# Patient Record
Sex: Female | Born: 1955 | Race: Black or African American | Hispanic: No | Marital: Married | Smoking: Never smoker
Health system: Southern US, Community
[De-identification: ages and names within clinical notes are randomized; demographics above are authoritative.]

## PROBLEM LIST (undated history)

## (undated) DIAGNOSIS — Z950 Presence of cardiac pacemaker: Secondary | ICD-10-CM

## (undated) DIAGNOSIS — IMO0001 Reserved for inherently not codable concepts without codable children: Secondary | ICD-10-CM

## (undated) DIAGNOSIS — Z8679 Personal history of other diseases of the circulatory system: Secondary | ICD-10-CM

## (undated) DIAGNOSIS — I10 Essential (primary) hypertension: Secondary | ICD-10-CM

## (undated) DIAGNOSIS — Z9889 Other specified postprocedural states: Secondary | ICD-10-CM

## (undated) DIAGNOSIS — I482 Chronic atrial fibrillation, unspecified: Secondary | ICD-10-CM

## (undated) DIAGNOSIS — E1165 Type 2 diabetes mellitus with hyperglycemia: Secondary | ICD-10-CM

## (undated) HISTORY — DX: Personal history of other diseases of the circulatory system: Z86.79

## (undated) HISTORY — DX: Type 2 diabetes mellitus with hyperglycemia: E11.65

## (undated) HISTORY — DX: Presence of cardiac pacemaker: Z95.0

## (undated) HISTORY — DX: Chronic atrial fibrillation, unspecified: I48.20

## (undated) HISTORY — DX: Reserved for inherently not codable concepts without codable children: IMO0001

## (undated) HISTORY — DX: Other specified postprocedural states: Z98.890

## (undated) HISTORY — DX: Essential (primary) hypertension: I10

---

## 1999-07-18 DIAGNOSIS — Z8679 Personal history of other diseases of the circulatory system: Secondary | ICD-10-CM

## 1999-07-18 DIAGNOSIS — Z9889 Other specified postprocedural states: Secondary | ICD-10-CM

## 1999-07-18 DIAGNOSIS — Z950 Presence of cardiac pacemaker: Secondary | ICD-10-CM

## 1999-07-18 HISTORY — DX: Presence of cardiac pacemaker: Z95.0

## 1999-07-18 HISTORY — PX: MITRAL VALVE REPAIR: SHX2039

## 1999-07-18 HISTORY — DX: Other specified postprocedural states: Z98.890

## 1999-07-18 HISTORY — PX: PACEMAKER INSERTION: SHX728

## 1999-07-18 HISTORY — DX: Personal history of other diseases of the circulatory system: Z86.79

## 1999-08-01 ENCOUNTER — Encounter: Payer: Self-pay | Admitting: *Deleted

## 1999-08-01 ENCOUNTER — Encounter (INDEPENDENT_AMBULATORY_CARE_PROVIDER_SITE_OTHER): Payer: Self-pay | Admitting: *Deleted

## 1999-08-01 ENCOUNTER — Inpatient Hospital Stay (HOSPITAL_COMMUNITY): Admission: EM | Admit: 1999-08-01 | Discharge: 1999-08-22 | Payer: Self-pay | Admitting: Emergency Medicine

## 1999-08-01 ENCOUNTER — Encounter: Payer: Self-pay | Admitting: Family Medicine

## 1999-08-03 DIAGNOSIS — I482 Chronic atrial fibrillation, unspecified: Secondary | ICD-10-CM

## 1999-08-03 DIAGNOSIS — I4891 Unspecified atrial fibrillation: Secondary | ICD-10-CM | POA: Insufficient documentation

## 1999-08-03 HISTORY — PX: CARDIAC CATHETERIZATION: SHX172

## 1999-08-05 ENCOUNTER — Encounter: Payer: Self-pay | Admitting: Thoracic Surgery (Cardiothoracic Vascular Surgery)

## 1999-08-08 ENCOUNTER — Encounter: Payer: Self-pay | Admitting: Surgery

## 1999-08-09 ENCOUNTER — Encounter: Payer: Self-pay | Admitting: Thoracic Surgery (Cardiothoracic Vascular Surgery)

## 1999-08-09 DIAGNOSIS — Z9889 Other specified postprocedural states: Secondary | ICD-10-CM

## 1999-08-09 HISTORY — DX: Other specified postprocedural states: Z98.890

## 1999-08-10 ENCOUNTER — Encounter: Payer: Self-pay | Admitting: Thoracic Surgery (Cardiothoracic Vascular Surgery)

## 1999-08-11 ENCOUNTER — Encounter: Payer: Self-pay | Admitting: Thoracic Surgery (Cardiothoracic Vascular Surgery)

## 1999-08-13 ENCOUNTER — Encounter: Payer: Self-pay | Admitting: Thoracic Surgery (Cardiothoracic Vascular Surgery)

## 1999-08-18 ENCOUNTER — Encounter: Payer: Self-pay | Admitting: Cardiovascular Disease

## 1999-09-02 ENCOUNTER — Encounter
Admission: RE | Admit: 1999-09-02 | Discharge: 1999-09-02 | Payer: Self-pay | Admitting: Thoracic Surgery (Cardiothoracic Vascular Surgery)

## 1999-09-02 ENCOUNTER — Encounter: Admission: RE | Admit: 1999-09-02 | Discharge: 1999-09-02 | Payer: Self-pay | Admitting: Cardiothoracic Surgery

## 1999-09-02 ENCOUNTER — Encounter: Payer: Self-pay | Admitting: Thoracic Surgery (Cardiothoracic Vascular Surgery)

## 1999-09-02 ENCOUNTER — Encounter: Payer: Self-pay | Admitting: Cardiothoracic Surgery

## 1999-09-14 DIAGNOSIS — I080 Rheumatic disorders of both mitral and aortic valves: Secondary | ICD-10-CM | POA: Insufficient documentation

## 1999-09-26 ENCOUNTER — Encounter: Payer: Self-pay | Admitting: Thoracic Surgery (Cardiothoracic Vascular Surgery)

## 1999-09-26 ENCOUNTER — Encounter
Admission: RE | Admit: 1999-09-26 | Discharge: 1999-09-26 | Payer: Self-pay | Admitting: Thoracic Surgery (Cardiothoracic Vascular Surgery)

## 2000-03-30 ENCOUNTER — Encounter: Admission: RE | Admit: 2000-03-30 | Discharge: 2000-03-30 | Payer: Self-pay | Admitting: Family Medicine

## 2000-04-30 ENCOUNTER — Ambulatory Visit (HOSPITAL_COMMUNITY): Admission: RE | Admit: 2000-04-30 | Discharge: 2000-04-30 | Payer: Self-pay | Admitting: *Deleted

## 2000-05-01 ENCOUNTER — Encounter: Admission: RE | Admit: 2000-05-01 | Discharge: 2000-05-01 | Payer: Self-pay | Admitting: Family Medicine

## 2000-05-10 ENCOUNTER — Encounter: Admission: RE | Admit: 2000-05-10 | Discharge: 2000-05-10 | Payer: Self-pay | Admitting: *Deleted

## 2000-07-17 ENCOUNTER — Encounter (INDEPENDENT_AMBULATORY_CARE_PROVIDER_SITE_OTHER): Payer: Self-pay | Admitting: *Deleted

## 2000-08-03 ENCOUNTER — Encounter: Admission: RE | Admit: 2000-08-03 | Discharge: 2000-08-03 | Payer: Self-pay | Admitting: Family Medicine

## 2000-10-23 ENCOUNTER — Encounter: Admission: RE | Admit: 2000-10-23 | Discharge: 2000-10-23 | Payer: Self-pay | Admitting: Family Medicine

## 2001-11-28 ENCOUNTER — Inpatient Hospital Stay (HOSPITAL_COMMUNITY): Admission: EM | Admit: 2001-11-28 | Discharge: 2001-12-03 | Payer: Self-pay | Admitting: Emergency Medicine

## 2001-11-28 ENCOUNTER — Encounter: Payer: Self-pay | Admitting: Cardiovascular Disease

## 2002-03-26 ENCOUNTER — Encounter: Admission: RE | Admit: 2002-03-26 | Discharge: 2002-03-26 | Payer: Self-pay | Admitting: Family Medicine

## 2002-04-03 ENCOUNTER — Emergency Department (HOSPITAL_COMMUNITY): Admission: EM | Admit: 2002-04-03 | Discharge: 2002-04-03 | Payer: Self-pay | Admitting: *Deleted

## 2002-04-14 ENCOUNTER — Encounter: Admission: RE | Admit: 2002-04-14 | Discharge: 2002-04-14 | Payer: Self-pay | Admitting: Sports Medicine

## 2002-08-20 ENCOUNTER — Encounter: Admission: RE | Admit: 2002-08-20 | Discharge: 2002-08-20 | Payer: Self-pay | Admitting: Family Medicine

## 2002-12-17 ENCOUNTER — Emergency Department (HOSPITAL_COMMUNITY): Admission: EM | Admit: 2002-12-17 | Discharge: 2002-12-17 | Payer: Self-pay | Admitting: Emergency Medicine

## 2002-12-31 ENCOUNTER — Encounter: Admission: RE | Admit: 2002-12-31 | Discharge: 2002-12-31 | Payer: Self-pay | Admitting: Family Medicine

## 2003-01-12 ENCOUNTER — Encounter: Admission: RE | Admit: 2003-01-12 | Discharge: 2003-01-12 | Payer: Self-pay | Admitting: Sports Medicine

## 2003-04-27 ENCOUNTER — Encounter: Admission: RE | Admit: 2003-04-27 | Discharge: 2003-04-27 | Payer: Self-pay | Admitting: Family Medicine

## 2003-08-13 ENCOUNTER — Emergency Department (HOSPITAL_COMMUNITY): Admission: AD | Admit: 2003-08-13 | Discharge: 2003-08-13 | Payer: Self-pay | Admitting: Family Medicine

## 2003-10-13 ENCOUNTER — Encounter: Admission: RE | Admit: 2003-10-13 | Discharge: 2003-10-13 | Payer: Self-pay | Admitting: Family Medicine

## 2003-10-30 ENCOUNTER — Encounter: Admission: RE | Admit: 2003-10-30 | Discharge: 2003-10-30 | Payer: Self-pay | Admitting: Sports Medicine

## 2003-11-29 ENCOUNTER — Emergency Department (HOSPITAL_COMMUNITY): Admission: EM | Admit: 2003-11-29 | Discharge: 2003-11-29 | Payer: Self-pay | Admitting: Family Medicine

## 2004-02-01 ENCOUNTER — Encounter: Admission: RE | Admit: 2004-02-01 | Discharge: 2004-02-01 | Payer: Self-pay | Admitting: Family Medicine

## 2004-02-01 ENCOUNTER — Inpatient Hospital Stay (HOSPITAL_COMMUNITY): Admission: AD | Admit: 2004-02-01 | Discharge: 2004-02-04 | Payer: Self-pay | Admitting: Cardiology

## 2004-12-21 ENCOUNTER — Ambulatory Visit: Payer: Self-pay | Admitting: Family Medicine

## 2005-03-23 ENCOUNTER — Ambulatory Visit (HOSPITAL_COMMUNITY): Admission: RE | Admit: 2005-03-23 | Discharge: 2005-03-23 | Payer: Self-pay | Admitting: Obstetrics

## 2005-04-21 ENCOUNTER — Ambulatory Visit: Payer: Self-pay | Admitting: Family Medicine

## 2006-01-10 ENCOUNTER — Ambulatory Visit: Payer: Self-pay | Admitting: Family Medicine

## 2006-01-11 ENCOUNTER — Encounter: Admission: RE | Admit: 2006-01-11 | Discharge: 2006-01-11 | Payer: Self-pay | Admitting: Family Medicine

## 2006-01-24 ENCOUNTER — Emergency Department (HOSPITAL_COMMUNITY): Admission: EM | Admit: 2006-01-24 | Discharge: 2006-01-24 | Payer: Self-pay | Admitting: Emergency Medicine

## 2006-02-28 ENCOUNTER — Ambulatory Visit: Payer: Self-pay | Admitting: Sports Medicine

## 2006-09-14 ENCOUNTER — Encounter (INDEPENDENT_AMBULATORY_CARE_PROVIDER_SITE_OTHER): Payer: Self-pay | Admitting: *Deleted

## 2006-11-05 ENCOUNTER — Telehealth: Payer: Self-pay | Admitting: *Deleted

## 2006-11-08 ENCOUNTER — Ambulatory Visit: Payer: Self-pay | Admitting: Sports Medicine

## 2006-11-08 ENCOUNTER — Encounter: Payer: Self-pay | Admitting: Family Medicine

## 2006-11-08 LAB — CONVERTED CEMR LAB
MCV: 91.2 fL
WBC: 6.7 10*3/uL

## 2006-11-21 ENCOUNTER — Telehealth (INDEPENDENT_AMBULATORY_CARE_PROVIDER_SITE_OTHER): Payer: Self-pay | Admitting: *Deleted

## 2006-11-29 ENCOUNTER — Telehealth (INDEPENDENT_AMBULATORY_CARE_PROVIDER_SITE_OTHER): Payer: Self-pay | Admitting: Family Medicine

## 2006-12-05 ENCOUNTER — Encounter: Payer: Self-pay | Admitting: Family Medicine

## 2007-01-01 ENCOUNTER — Ambulatory Visit: Payer: Self-pay | Admitting: Sports Medicine

## 2007-01-30 ENCOUNTER — Telehealth: Payer: Self-pay | Admitting: *Deleted

## 2007-01-31 ENCOUNTER — Encounter: Payer: Self-pay | Admitting: Family Medicine

## 2007-01-31 ENCOUNTER — Ambulatory Visit (HOSPITAL_COMMUNITY): Admission: RE | Admit: 2007-01-31 | Discharge: 2007-01-31 | Payer: Self-pay | Admitting: Family Medicine

## 2007-01-31 ENCOUNTER — Ambulatory Visit: Payer: Self-pay | Admitting: Family Medicine

## 2007-01-31 LAB — CONVERTED CEMR LAB
BUN: 9 mg/dL (ref 6–23)
CO2: 28 meq/L (ref 19–32)
Calcium: 8.5 mg/dL (ref 8.4–10.5)
Chloride: 103 meq/L (ref 96–112)
Creatinine, Ser: 0.74 mg/dL (ref 0.40–1.20)
Glucose, Bld: 113 mg/dL — ABNORMAL HIGH (ref 70–99)
Hemoglobin: 13.6 g/dL (ref 12.0–15.0)
MCHC: 32.5 g/dL (ref 30.0–36.0)
Platelets: 162 10*3/uL (ref 150–400)
Potassium: 3.8 meq/L (ref 3.5–5.3)
RBC: 4.51 M/uL (ref 3.87–5.11)
TSH: 1.411 microintl units/mL (ref 0.350–5.50)
WBC: 6.1 10*3/uL (ref 4.0–10.5)

## 2007-02-01 ENCOUNTER — Telehealth: Payer: Self-pay | Admitting: Family Medicine

## 2007-02-01 ENCOUNTER — Encounter: Payer: Self-pay | Admitting: Family Medicine

## 2007-02-06 ENCOUNTER — Encounter: Payer: Self-pay | Admitting: Family Medicine

## 2007-04-10 ENCOUNTER — Encounter: Payer: Self-pay | Admitting: Family Medicine

## 2007-05-24 ENCOUNTER — Ambulatory Visit: Payer: Self-pay | Admitting: Family Medicine

## 2007-05-24 LAB — CONVERTED CEMR LAB: INR: 2.7

## 2007-05-31 ENCOUNTER — Telehealth: Payer: Self-pay | Admitting: *Deleted

## 2007-05-31 ENCOUNTER — Ambulatory Visit: Payer: Self-pay | Admitting: Family Medicine

## 2007-06-06 ENCOUNTER — Encounter: Payer: Self-pay | Admitting: Family Medicine

## 2007-06-07 ENCOUNTER — Encounter: Payer: Self-pay | Admitting: Family Medicine

## 2007-06-08 ENCOUNTER — Encounter: Payer: Self-pay | Admitting: Family Medicine

## 2007-06-11 ENCOUNTER — Encounter: Payer: Self-pay | Admitting: Family Medicine

## 2007-06-17 ENCOUNTER — Telehealth: Payer: Self-pay | Admitting: *Deleted

## 2007-06-21 ENCOUNTER — Encounter (INDEPENDENT_AMBULATORY_CARE_PROVIDER_SITE_OTHER): Payer: Self-pay | Admitting: *Deleted

## 2007-07-09 ENCOUNTER — Encounter: Payer: Self-pay | Admitting: Family Medicine

## 2007-07-23 ENCOUNTER — Ambulatory Visit: Payer: Self-pay | Admitting: Family Medicine

## 2007-07-23 LAB — CONVERTED CEMR LAB: INR: 1.4

## 2007-07-30 ENCOUNTER — Ambulatory Visit: Payer: Self-pay | Admitting: Family Medicine

## 2007-07-30 LAB — CONVERTED CEMR LAB: INR: 2.5

## 2007-08-08 ENCOUNTER — Ambulatory Visit: Payer: Self-pay | Admitting: Family Medicine

## 2007-08-22 ENCOUNTER — Ambulatory Visit: Payer: Self-pay | Admitting: Family Medicine

## 2007-09-12 ENCOUNTER — Encounter: Payer: Self-pay | Admitting: Family Medicine

## 2007-09-13 ENCOUNTER — Ambulatory Visit: Payer: Self-pay | Admitting: Family Medicine

## 2007-10-11 ENCOUNTER — Ambulatory Visit: Payer: Self-pay | Admitting: Family Medicine

## 2007-10-31 ENCOUNTER — Encounter: Payer: Self-pay | Admitting: Family Medicine

## 2007-11-07 LAB — CONVERTED CEMR LAB
Hgb A1c MFr Bld: 7.7 %
Pro B Natriuretic peptide (BNP): 331 pg/mL

## 2007-11-08 ENCOUNTER — Ambulatory Visit: Payer: Self-pay | Admitting: Family Medicine

## 2007-12-06 ENCOUNTER — Encounter: Payer: Self-pay | Admitting: Family Medicine

## 2007-12-10 ENCOUNTER — Ambulatory Visit: Payer: Self-pay | Admitting: Family Medicine

## 2007-12-20 ENCOUNTER — Telehealth: Payer: Self-pay | Admitting: Family Medicine

## 2008-01-07 ENCOUNTER — Ambulatory Visit: Payer: Self-pay | Admitting: Family Medicine

## 2008-01-14 ENCOUNTER — Telehealth: Payer: Self-pay | Admitting: Family Medicine

## 2008-01-14 ENCOUNTER — Encounter: Payer: Self-pay | Admitting: Family Medicine

## 2008-01-20 ENCOUNTER — Telehealth (INDEPENDENT_AMBULATORY_CARE_PROVIDER_SITE_OTHER): Payer: Self-pay | Admitting: *Deleted

## 2008-01-30 ENCOUNTER — Ambulatory Visit: Payer: Self-pay | Admitting: Family Medicine

## 2008-01-30 LAB — CONVERTED CEMR LAB: INR: 2

## 2008-02-11 ENCOUNTER — Ambulatory Visit: Payer: Self-pay | Admitting: Family Medicine

## 2008-02-11 ENCOUNTER — Encounter: Payer: Self-pay | Admitting: Family Medicine

## 2008-02-11 DIAGNOSIS — E1165 Type 2 diabetes mellitus with hyperglycemia: Secondary | ICD-10-CM

## 2008-02-11 LAB — CONVERTED CEMR LAB: Hgb A1c MFr Bld: 8.8 %

## 2008-02-13 ENCOUNTER — Encounter: Payer: Self-pay | Admitting: Family Medicine

## 2008-02-27 ENCOUNTER — Encounter: Payer: Self-pay | Admitting: Family Medicine

## 2008-02-28 ENCOUNTER — Ambulatory Visit: Payer: Self-pay | Admitting: Family Medicine

## 2008-03-13 ENCOUNTER — Ambulatory Visit: Payer: Self-pay | Admitting: Family Medicine

## 2008-03-13 LAB — CONVERTED CEMR LAB: INR: 1.7

## 2008-03-18 ENCOUNTER — Encounter: Payer: Self-pay | Admitting: Family Medicine

## 2008-03-18 LAB — CONVERTED CEMR LAB: Glucose, Bld: 261 mg/dL

## 2008-03-25 ENCOUNTER — Encounter: Admission: RE | Admit: 2008-03-25 | Discharge: 2008-03-25 | Payer: Self-pay | Admitting: Obstetrics

## 2008-03-31 ENCOUNTER — Ambulatory Visit: Payer: Self-pay | Admitting: Family Medicine

## 2008-03-31 LAB — CONVERTED CEMR LAB: INR: 2.8

## 2008-04-14 ENCOUNTER — Ambulatory Visit: Payer: Self-pay | Admitting: Family Medicine

## 2008-04-28 ENCOUNTER — Encounter: Payer: Self-pay | Admitting: Family Medicine

## 2008-05-01 ENCOUNTER — Ambulatory Visit: Payer: Self-pay | Admitting: Family Medicine

## 2008-05-02 ENCOUNTER — Encounter: Payer: Self-pay | Admitting: Family Medicine

## 2008-05-22 ENCOUNTER — Ambulatory Visit: Payer: Self-pay | Admitting: Family Medicine

## 2008-05-22 LAB — CONVERTED CEMR LAB: INR: 2.9

## 2008-06-24 ENCOUNTER — Ambulatory Visit: Payer: Self-pay | Admitting: Family Medicine

## 2008-06-24 LAB — CONVERTED CEMR LAB: INR: 2

## 2008-07-22 ENCOUNTER — Encounter: Payer: Self-pay | Admitting: Family Medicine

## 2008-07-23 ENCOUNTER — Ambulatory Visit: Payer: Self-pay | Admitting: Family Medicine

## 2008-07-23 LAB — CONVERTED CEMR LAB: INR: 2.5

## 2008-08-20 ENCOUNTER — Encounter: Payer: Self-pay | Admitting: Family Medicine

## 2008-09-11 ENCOUNTER — Ambulatory Visit: Payer: Self-pay | Admitting: Family Medicine

## 2008-10-08 ENCOUNTER — Ambulatory Visit: Payer: Self-pay | Admitting: Family Medicine

## 2008-11-10 ENCOUNTER — Ambulatory Visit: Payer: Self-pay | Admitting: Family Medicine

## 2008-11-10 ENCOUNTER — Encounter: Payer: Self-pay | Admitting: Family Medicine

## 2008-11-10 LAB — CONVERTED CEMR LAB: INR: 2

## 2008-11-26 ENCOUNTER — Encounter: Payer: Self-pay | Admitting: Family Medicine

## 2008-11-26 ENCOUNTER — Ambulatory Visit (HOSPITAL_COMMUNITY): Admission: RE | Admit: 2008-11-26 | Discharge: 2008-11-26 | Payer: Self-pay | Admitting: Cardiology

## 2008-11-26 HISTORY — PX: PACEMAKER GENERATOR CHANGE: SHX5998

## 2008-11-26 LAB — CONVERTED CEMR LAB
Cholesterol: 177 mg/dL
HDL: 50 mg/dL
LDL Cholesterol: 112 mg/dL
Total CHOL/HDL Ratio: 3.5
Triglycerides: 77 mg/dL

## 2008-11-30 ENCOUNTER — Encounter: Payer: Self-pay | Admitting: Family Medicine

## 2008-11-30 ENCOUNTER — Ambulatory Visit: Payer: Self-pay | Admitting: Family Medicine

## 2008-11-30 LAB — CONVERTED CEMR LAB: INR: 1.5

## 2008-12-08 ENCOUNTER — Encounter: Payer: Self-pay | Admitting: Family Medicine

## 2008-12-08 ENCOUNTER — Ambulatory Visit: Payer: Self-pay | Admitting: Family Medicine

## 2008-12-08 LAB — CONVERTED CEMR LAB: INR: 1.8

## 2008-12-18 ENCOUNTER — Ambulatory Visit: Payer: Self-pay | Admitting: Family Medicine

## 2009-01-01 ENCOUNTER — Ambulatory Visit: Payer: Self-pay | Admitting: Family Medicine

## 2009-01-01 LAB — CONVERTED CEMR LAB: INR: 1.9

## 2009-01-06 ENCOUNTER — Telehealth: Payer: Self-pay | Admitting: Family Medicine

## 2009-01-07 ENCOUNTER — Encounter (INDEPENDENT_AMBULATORY_CARE_PROVIDER_SITE_OTHER): Payer: Self-pay | Admitting: Family Medicine

## 2009-01-07 ENCOUNTER — Ambulatory Visit: Payer: Self-pay | Admitting: Family Medicine

## 2009-01-07 LAB — CONVERTED CEMR LAB
AST: 23 units/L (ref 0–37)
Albumin: 4.8 g/dL (ref 3.5–5.2)
Alkaline Phosphatase: 170 units/L — ABNORMAL HIGH (ref 39–117)
BUN: 12 mg/dL (ref 6–23)
Creatinine, Ser: 0.61 mg/dL (ref 0.40–1.20)
Glucose, Bld: 149 mg/dL — ABNORMAL HIGH (ref 70–99)
HCT: 41.5 % (ref 36.0–46.0)
Hemoglobin: 14 g/dL (ref 12.0–15.0)
MCHC: 33.7 g/dL (ref 30.0–36.0)
MCV: 90.4 fL (ref 78.0–100.0)
Potassium: 4.1 meq/L (ref 3.5–5.3)
RBC: 4.59 M/uL (ref 3.87–5.11)
RDW: 13.9 % (ref 11.5–15.5)

## 2009-01-11 ENCOUNTER — Encounter (INDEPENDENT_AMBULATORY_CARE_PROVIDER_SITE_OTHER): Payer: Self-pay | Admitting: Family Medicine

## 2009-01-11 ENCOUNTER — Telehealth (INDEPENDENT_AMBULATORY_CARE_PROVIDER_SITE_OTHER): Payer: Self-pay | Admitting: Family Medicine

## 2009-01-11 LAB — CONVERTED CEMR LAB
Bilirubin, Direct: 0.4 mg/dL — ABNORMAL HIGH (ref 0.0–0.3)
Indirect Bilirubin: 0.9 mg/dL (ref 0.0–0.9)
Total Bilirubin: 1.3 mg/dL — ABNORMAL HIGH (ref 0.3–1.2)

## 2009-01-15 ENCOUNTER — Ambulatory Visit: Payer: Self-pay | Admitting: Family Medicine

## 2009-01-21 ENCOUNTER — Ambulatory Visit: Payer: Self-pay | Admitting: Family Medicine

## 2009-01-21 LAB — CONVERTED CEMR LAB: INR: 2

## 2009-02-04 ENCOUNTER — Ambulatory Visit: Payer: Self-pay | Admitting: Family Medicine

## 2009-02-04 LAB — CONVERTED CEMR LAB: INR: 1.6

## 2009-02-18 ENCOUNTER — Ambulatory Visit: Payer: Self-pay | Admitting: Family Medicine

## 2009-03-01 ENCOUNTER — Encounter: Payer: Self-pay | Admitting: Family Medicine

## 2009-03-01 ENCOUNTER — Ambulatory Visit: Payer: Self-pay | Admitting: Family Medicine

## 2009-03-02 ENCOUNTER — Telehealth: Payer: Self-pay | Admitting: *Deleted

## 2009-03-03 ENCOUNTER — Encounter: Payer: Self-pay | Admitting: Family Medicine

## 2009-03-03 ENCOUNTER — Encounter: Admission: RE | Admit: 2009-03-03 | Discharge: 2009-03-03 | Payer: Self-pay | Admitting: Family Medicine

## 2009-03-04 ENCOUNTER — Encounter: Payer: Self-pay | Admitting: Family Medicine

## 2009-03-04 ENCOUNTER — Telehealth: Payer: Self-pay | Admitting: Family Medicine

## 2009-03-04 DIAGNOSIS — R188 Other ascites: Secondary | ICD-10-CM | POA: Insufficient documentation

## 2009-03-04 DIAGNOSIS — K7460 Unspecified cirrhosis of liver: Secondary | ICD-10-CM | POA: Insufficient documentation

## 2009-03-11 ENCOUNTER — Ambulatory Visit: Payer: Self-pay | Admitting: Family Medicine

## 2009-03-17 ENCOUNTER — Encounter: Payer: Self-pay | Admitting: Family Medicine

## 2009-03-17 ENCOUNTER — Ambulatory Visit: Admission: RE | Admit: 2009-03-17 | Discharge: 2009-03-17 | Payer: Self-pay | Admitting: Gynecologic Oncology

## 2009-03-18 ENCOUNTER — Encounter: Payer: Self-pay | Admitting: Family Medicine

## 2009-03-23 ENCOUNTER — Telehealth: Payer: Self-pay | Admitting: *Deleted

## 2009-03-25 ENCOUNTER — Ambulatory Visit (HOSPITAL_COMMUNITY): Admission: RE | Admit: 2009-03-25 | Discharge: 2009-03-25 | Payer: Self-pay | Admitting: Family Medicine

## 2009-03-25 ENCOUNTER — Encounter (INDEPENDENT_AMBULATORY_CARE_PROVIDER_SITE_OTHER): Payer: Self-pay | Admitting: Interventional Radiology

## 2009-03-26 ENCOUNTER — Encounter: Payer: Self-pay | Admitting: Family Medicine

## 2009-04-01 ENCOUNTER — Telehealth: Payer: Self-pay | Admitting: Family Medicine

## 2009-04-06 ENCOUNTER — Encounter: Payer: Self-pay | Admitting: Family Medicine

## 2009-04-08 ENCOUNTER — Ambulatory Visit: Payer: Self-pay | Admitting: Family Medicine

## 2009-04-08 LAB — CONVERTED CEMR LAB: INR: 2.1

## 2009-04-14 ENCOUNTER — Encounter: Payer: Self-pay | Admitting: Family Medicine

## 2009-04-14 ENCOUNTER — Ambulatory Visit: Payer: Self-pay | Admitting: Family Medicine

## 2009-04-14 LAB — CONVERTED CEMR LAB: Hgb A1c MFr Bld: 6.6 %

## 2009-04-20 ENCOUNTER — Telehealth: Payer: Self-pay | Admitting: Family Medicine

## 2009-04-20 ENCOUNTER — Encounter: Payer: Self-pay | Admitting: Family Medicine

## 2009-04-20 LAB — CONVERTED CEMR LAB
Albumin: 4.1 g/dL (ref 3.5–5.2)
BUN: 12 mg/dL (ref 6–23)
Calcium: 9.4 mg/dL (ref 8.4–10.5)
Chloride: 102 meq/L (ref 96–112)
Creatinine, Ser: 0.66 mg/dL (ref 0.40–1.20)
Glucose, Bld: 112 mg/dL — ABNORMAL HIGH (ref 70–99)
HCT: 37.8 % (ref 36.0–46.0)
HCV Ab: NEGATIVE
Hemoglobin: 12.2 g/dL (ref 12.0–15.0)
Hep A IgM: NEGATIVE
Hep B C IgM: NEGATIVE
MCHC: 32.3 g/dL (ref 30.0–36.0)
Potassium: 4 meq/L (ref 3.5–5.3)
RBC: 4.26 M/uL (ref 3.87–5.11)

## 2009-04-21 ENCOUNTER — Telehealth: Payer: Self-pay | Admitting: Family Medicine

## 2009-05-03 ENCOUNTER — Encounter: Payer: Self-pay | Admitting: Family Medicine

## 2009-05-06 ENCOUNTER — Ambulatory Visit: Payer: Self-pay | Admitting: Family Medicine

## 2009-06-03 ENCOUNTER — Ambulatory Visit: Payer: Self-pay | Admitting: Family Medicine

## 2009-06-16 ENCOUNTER — Telehealth: Payer: Self-pay | Admitting: Family Medicine

## 2009-06-28 ENCOUNTER — Ambulatory Visit: Payer: Self-pay | Admitting: Family Medicine

## 2009-07-26 ENCOUNTER — Ambulatory Visit: Payer: Self-pay | Admitting: Family Medicine

## 2009-08-16 ENCOUNTER — Ambulatory Visit: Payer: Self-pay | Admitting: Family Medicine

## 2009-08-23 ENCOUNTER — Ambulatory Visit: Payer: Self-pay | Admitting: Family Medicine

## 2009-08-23 LAB — CONVERTED CEMR LAB: INR: 1.8

## 2009-09-06 ENCOUNTER — Encounter: Payer: Self-pay | Admitting: Family Medicine

## 2009-09-20 ENCOUNTER — Encounter: Payer: Self-pay | Admitting: Family Medicine

## 2009-10-01 ENCOUNTER — Encounter: Payer: Self-pay | Admitting: Family Medicine

## 2009-10-22 ENCOUNTER — Encounter: Payer: Self-pay | Admitting: Family Medicine

## 2009-11-17 ENCOUNTER — Encounter: Payer: Self-pay | Admitting: Family Medicine

## 2009-12-06 ENCOUNTER — Encounter: Payer: Self-pay | Admitting: Family Medicine

## 2009-12-20 ENCOUNTER — Encounter: Admission: RE | Admit: 2009-12-20 | Discharge: 2009-12-20 | Payer: Self-pay | Admitting: Cardiovascular Disease

## 2009-12-24 ENCOUNTER — Encounter: Payer: Self-pay | Admitting: Family Medicine

## 2010-01-06 ENCOUNTER — Telehealth: Payer: Self-pay | Admitting: Family Medicine

## 2010-01-06 ENCOUNTER — Encounter: Payer: Self-pay | Admitting: Family Medicine

## 2010-01-11 ENCOUNTER — Ambulatory Visit: Payer: Self-pay | Admitting: Family Medicine

## 2010-01-11 LAB — CONVERTED CEMR LAB: INR: 1.8

## 2010-01-25 ENCOUNTER — Ambulatory Visit: Payer: Self-pay | Admitting: Family Medicine

## 2010-01-25 LAB — CONVERTED CEMR LAB: INR: 2.2

## 2010-02-08 ENCOUNTER — Ambulatory Visit: Payer: Self-pay | Admitting: Family Medicine

## 2010-02-08 LAB — CONVERTED CEMR LAB: INR: 1.8

## 2010-03-01 ENCOUNTER — Ambulatory Visit: Payer: Self-pay | Admitting: Family Medicine

## 2010-03-01 LAB — CONVERTED CEMR LAB: INR: 2.1

## 2010-03-10 ENCOUNTER — Telehealth (INDEPENDENT_AMBULATORY_CARE_PROVIDER_SITE_OTHER): Payer: Self-pay | Admitting: *Deleted

## 2010-03-15 ENCOUNTER — Ambulatory Visit: Payer: Self-pay | Admitting: Family Medicine

## 2010-03-15 LAB — CONVERTED CEMR LAB: INR: 1.7

## 2010-03-23 ENCOUNTER — Encounter: Payer: Self-pay | Admitting: Family Medicine

## 2010-04-05 ENCOUNTER — Telehealth: Payer: Self-pay | Admitting: Family Medicine

## 2010-05-10 ENCOUNTER — Encounter: Payer: Self-pay | Admitting: Family Medicine

## 2010-05-10 ENCOUNTER — Ambulatory Visit: Payer: Self-pay | Admitting: Family Medicine

## 2010-05-10 LAB — CONVERTED CEMR LAB
Albumin/Creatinine Ratio, Urine, POC: ABNORMAL
BUN: 10 mg/dL (ref 6–23)
Calcium: 9.4 mg/dL (ref 8.4–10.5)
Cholesterol: 172 mg/dL (ref 0–200)
Creatinine, Ser: 0.69 mg/dL (ref 0.40–1.20)
Creatinine,U: 10 mg/dL
Glucose, Bld: 145 mg/dL — ABNORMAL HIGH (ref 70–99)
Microalbumin U total vol: 30 mg/L
Sodium: 139 meq/L (ref 135–145)

## 2010-05-12 ENCOUNTER — Encounter: Payer: Self-pay | Admitting: Family Medicine

## 2010-05-25 ENCOUNTER — Encounter: Payer: Self-pay | Admitting: Family Medicine

## 2010-08-07 ENCOUNTER — Encounter: Payer: Self-pay | Admitting: Obstetrics

## 2010-08-16 ENCOUNTER — Ambulatory Visit
Admission: RE | Admit: 2010-08-16 | Discharge: 2010-08-16 | Payer: Self-pay | Source: Home / Self Care | Attending: Family Medicine | Admitting: Family Medicine

## 2010-08-16 LAB — CONVERTED CEMR LAB: INR: 2.7

## 2010-08-18 NOTE — Progress Notes (Signed)
  Phone Note Outgoing Call   Action Taken: Phone Call Completed Details for Reason: Lab appt and office visit Summary of Call: Called pt regarding missed lab appointment and need to schedule and office visit with me. She is followed by  [facility] cardiology. Reports some improvement in ascites, but still present. Pt was prescribed spirronolactone by cardiologist, pt has stopped taking the medication. Encouraged the pt to take med and schedule office visit for monitoring.  Initial call taken by: Boykin Nearing MD,  April 05, 2010 12:13 PM

## 2010-08-18 NOTE — Assessment & Plan Note (Signed)
Summary: f/up,tcb   Vital Signs:  Patient profile:   55 year old female Height:      63.75 inches Weight:      152 pounds BMI:     26.39 BSA:     1.74 Temp:     97.9 degrees F Pulse rate:   77 / minute BP sitting:   131 / 81  Vitals Entered By: Christen Bame CMA (August 16, 2009 3:57 PM) CC: f/u Is Patient Diabetic? Yes Did you bring your meter with you today? No Pain Assessment Patient in pain? no        Primary Care Provider:  Graciella Belton MD  CC:  f/u.  History of Present Illness: CIRRHOSIS/ASCITES.  Is followed by [facility].  Missed last appointment but is rescheduled for February.  Increasing abdominal girth without pain or fever.  Last CMet essentially WNL.  A-FIB.  Therapeutic INR.  Takes warfarin, CCB, and beta blocker as prescribed.  No palpitations.  States rhythm comes and goes.  Sinus today.  DM, TYPE 2.  Takes Metformin once daily.  Twice daily gives her stomach upset.  Good control today.  PREVENTION.  Requests flu shot today.  Habits & Providers  Alcohol-Tobacco-Diet     Tobacco Status: never  Current Problems (verified): 1)  Cirrhosis  (ICD-571.5) 2)  Other Ascites  (ICD-789.59) 3)  Diabetes-type 2  (ICD-250.00) 4)  Atrial Fibrillation  (ICD-427.31) 5)  Mitral Regurgitation, Severe, S/p Repair  (ICD-396.3) 6)  Status, Cardiac Pacemaker  (ICD-V45.01) 7)  Coumadin Therapy  (ICD-V58.61)  Current Medications (verified): 1)  Warfarin Sodium 5 Mg  Tabs (Warfarin Sodium) .... Take As Directed 2)  Atenolol 100 Mg  Tabs (Atenolol) .Marland Kitchen.. 1 Tab By Mouth Daily 3)  Diltiazem Hcl Cr 240 Mg  Xr24h-Cap (Diltiazem Hcl) .Marland Kitchen.. 1 Tab By Mouth Daily 4)  Metformin Hcl 500 Mg  Xr24h-Tab (Metformin Hcl) .Marland Kitchen.. 1 Tab By Mouth Once Daily  Allergies (verified): No Known Drug Allergies  Physical Exam  Additional Exam:  VITALS:  Reviewed, normal GEN: Alert & oriented, no acute distress NECK: Midline trachea, no masses/thyromegaly CARDIO: **REGULAR** rate  and rhythm, 2/6 systolic murmur, 2+ bilateral radial pulses, no carotid bruits RESP: Clear to auscultation, normal work of breathing, no retractions/accessory muscle use ABD: Normoactive bowel sounds, nontender, distended with fluid wave EXT: Nontender, no edema    Impression & Recommendations:  Problem # 1:  CIRRHOSIS (ICD-571.5) Assessment Unchanged  Needs to f/u with [facility]--scheduled for end of february. Her updated medication list for this problem includes:    Atenolol 100 Mg Tabs (Atenolol) .Marland Kitchen... 1 tab by mouth daily  Orders: [place]- Est  Level 4 [id number])  Problem # 2:  OTHER ASCITES (ICD-789.59) Assessment: Deteriorated  Increased fluid today.  No signs of SBP.  RTC if fever or pain.  Orders: [place]- Est  Level 4 (99214)  Problem # 3:  DIABETES-TYPE 2 (ICD-250.00) Assessment: Improved At goal Her updated medication list for this problem includes:    Metformin Hcl 500 Mg Xr24h-tab (Metformin hcl) .Marland Kitchen... 1 tab by mouth once daily  Orders: A1C-[facility] [id number]) [facility]- Est  Level 4 [id number])  Labs Reviewed: Creat: 0.66 (04/14/2009)    Reviewed HgBA1c results: 6.8 (08/16/2009)  6.6 (04/14/2009)  Problem # 4:  ATRIAL FIBRILLATION (ICD-427.31) Assessment: Improved  Sinus today. Her updated medication list for this problem includes:    Warfarin Sodium 5 Mg Tabs (Warfarin sodium) .Marland Kitchen... Take as directed    Atenolol 100 Mg Tabs (Atenolol) .Marland Kitchen... 1 tab  by mouth daily    Diltiazem Hcl Cr 240 Mg Xr24h-cap (Diltiazem hcl) .Marland Kitchen... 1 tab by mouth daily  Orders: [place]- Est  Level 4 [id number])  Problem # 5:  Preventive Health Care (ICD-V70.0) Assessment: Comment Only Flu vaccine given today.  Complete Medication List: 1)  Warfarin Sodium 5 Mg Tabs (Warfarin sodium) .... Take as directed 2)  Atenolol 100 Mg Tabs (Atenolol) .Marland Kitchen.. 1 tab by mouth daily 3)  Diltiazem Hcl Cr 240 Mg Xr24h-cap (Diltiazem hcl) .Marland Kitchen.. 1 tab by mouth daily 4)  Metformin Hcl 500 Mg Xr24h-tab (Metformin hcl) .Marland Kitchen.. 1 tab by  mouth once daily  Other Orders: Influenza Vaccine NON MCR [id number])  Patient Instructions: 1)  This is called ASCITES. 2)  Please schedule a follow-up appointment in 3 months .  Prescriptions: METFORMIN HCL 500 MG  XR24H-TAB (METFORMIN HCL) 1 tab by mouth once daily  #30 x 3   Entered and Authorized by:   Graciella Belton MD   Signed by:   Graciella Belton MD on 08/16/2009   Method used:   [facility]        [facility] [phone]* (retail)       [address]       [place],   [address]       Ph: [phone] or [phone]       Fax: [phone]   RxID:   [id number]   Laboratory Results   Blood Tests   Date/Time Received: August 16, 2009 4:01 PM  Date/Time Reported: August 16, 2009 4:10 PM   HGBA1C: 6.8%   (Normal Range: Non-Diabetic - 3-6%   Control Diabetic - 6-8%)  Comments: ...........test performed by...........Marland KitchenHedy Camara, CMA       Immunizations Administered:  Influenza Vaccine # 1:    Vaccine Type: Fluvax Non-MCR    Site: left deltoid    Mfr: GlaxoSmithKline    Dose: 0.5 ml    Route: IM    Given by: Christen Bame CMA    Exp. Date: 01/13/2010    Lot #: [id number]    VIS given: 8.10.10  Flu Vaccine Consent Questions:    Do you have a history of severe allergic reactions to this vaccine? no    Any prior history of allergic reactions to egg and/or gelatin? no    Do you have a sensitivity to the preservative Thimersol? no    Do you have a past history of Guillan-Barre Syndrome? no    Do you currently have an acute febrile illness? no    Have you ever had a severe reaction to latex? no    Vaccine information given and explained to patient? yes    Are you currently pregnant? no

## 2010-08-18 NOTE — Miscellaneous (Signed)
Summary: Chart Summary  Clinical Lists Changes  Problems: Assessed CIRRHOSIS as comment only - Uncertain diagnosis--was possible diagnosis from CT 02/2008 that confirmed ascites. Her updated medication list for this problem includes:    Atenolol 100 Mg Tabs (Atenolol) .Marland Kitchen... 1 tab by mouth daily  Assessed OTHER ASCITES as comment only - First thought to be gyn malignancy or secondary to liver disease, but now seems to be secondary to cardiac problem.  She is followed by [facility] Cardiology who is concerned about constrictive pericardial disease.  Their last note indicates they will do catheterization as well. Assessed DIABETES-TYPE 2 as comment only - Well controlled on low dose of Metformin.  Due for A1C. Her updated medication list for this problem includes:    Metformin Hcl 500 Mg Xr24h-tab (Metformin hcl) .Marland Kitchen... 1 tab by mouth once daily  Labs Reviewed: Creat: 0.66 (04/14/2009)    Reviewed HgBA1c results: 6.8 (08/16/2009)  6.6 (04/14/2009)  Assessed ATRIAL FIBRILLATION as comment only - Followed by [facility]iology. Her updated medication list for this problem includes:    Warfarin Sodium 5 Mg Tabs (Warfarin sodium) .Marland Kitchen... Take as directed    Atenolol 100 Mg Tabs (Atenolol) .Marland Kitchen... 1 tab by mouth daily    Diltiazem Hcl Cr 240 Mg Xr24h-cap (Diltiazem hcl) .Marland Kitchen... 1 tab by mouth daily  Assessed MITRAL REGURGITATION, SEVERE, S/P REPAIR as comment only - Followed by [facility] Cardiology. Her updated medication list for this problem includes:    Warfarin Sodium 5 Mg Tabs (Warfarin sodium) .Marland Kitchen... Take as directed    Atenolol 100 Mg Tabs (Atenolol) .Marland Kitchen... 1 tab by mouth daily  Assessed STATUS, CARDIAC PACEMAKER as comment only - Followed by [facility] Cardiology. Assessed COUMADIN THERAPY as comment only - Has not been in for follow up of INR since February.  Called patient at work number [phone]) to see if she is still taking it and if it is being monitored by another  doctor.  No answer.  Asked her to call me back.       Impression & Recommendations:  Problem # 1:  CIRRHOSIS (ICD-571.5) Assessment Comment Only Uncertain diagnosis--was possible diagnosis from CT 02/2008 that confirmed ascites. Her updated medication list for this problem includes:    Atenolol 100 Mg Tabs (Atenolol) .Marland Kitchen... 1 tab by mouth daily  Problem # 2:  OTHER ASCITES (ICD-789.59) Assessment: Comment Only First thought to be gyn malignancy or secondary to liver disease, but now seems to be secondary to cardiac problem.  She is followed by [facility] Cardiology who is concerned about constrictive pericardial disease.  Their last note indicates they will do catheterization as well.  Problem # 3:  DIABETES-TYPE 2 (ICD-250.00) Assessment: Comment Only Well controlled on low dose of Metformin.  Due for A1C. Her updated medication list for this problem includes:    Metformin Hcl 500 Mg Xr24h-tab (Metformin hcl) .Marland Kitchen... 1 tab by mouth once daily  Labs Reviewed: Creat: 0.66 (04/14/2009)    Reviewed HgBA1c results: 6.8 (08/16/2009)  6.6 (04/14/2009)  Problem # 4:  ATRIAL FIBRILLATION (ICD-427.31) Assessment: Comment Only Followed by [facility] Cardiology. Her updated medication list for this problem includes:    Warfarin Sodium 5 Mg Tabs (Warfarin sodium) .Marland Kitchen... Take as directed    Atenolol 100 Mg Tabs (Atenolol) .Marland Kitchen... 1 tab by mouth daily    Diltiazem Hcl Cr 240 Mg Xr24h-cap (Diltiazem hcl) .Marland Kitchen... 1 tab by mouth daily  Problem # 5:  MITRAL REGURGITATION, SEVERE, S/P REPAIR (ICD-396.3) Assessment: Comment Only Followed by [facility] Cardiology. Her  updated medication list for this problem includes:    Warfarin Sodium 5 Mg Tabs (Warfarin sodium) .Marland Kitchen... Take as directed    Atenolol 100 Mg Tabs (Atenolol) .Marland Kitchen... 1 tab by mouth daily  Problem # 6:  STATUS, CARDIAC PACEMAKER (ICD-V45.01) Assessment: Comment Only Followed by [facility] Cardiology.  Problem # 7:  COUMADIN  THERAPY (ICD-V58.61) Assessment: Comment Only Has not been in for follow up of INR since February.  Called patient at work number ([phone]) to see if she is still taking it and if it is being monitored by another doctor.  No answer.  Asked her to call me back.  Complete Medication List: 1)  Warfarin Sodium 5 Mg Tabs (Warfarin sodium) .... Take as directed 2)  Atenolol 100 Mg Tabs (Atenolol) .Marland Kitchen.. 1 tab by mouth daily 3)  Diltiazem Hcl Cr 240 Mg Xr24h-cap (Diltiazem hcl) .Marland Kitchen.. 1 tab by mouth daily 4)  Metformin Hcl 500 Mg Xr24h-tab (Metformin hcl) .Marland Kitchen.. 1 tab by mouth once daily

## 2010-08-18 NOTE — Consult Note (Signed)
Summary: [place]   Imported By: Raymond Gurney 10/28/2009 10:02:58  _____________________________________________________________________  External Attachment:    Type:   Image     Comment:   External Document

## 2010-08-18 NOTE — Progress Notes (Signed)
Summary: Needs INR check.  Phone Note Call from Patient Call back at Work Phone [phone]   Caller: Patient Summary of Call: pt returning Dr. Richardson Landry call Initial call taken by: Raymond Gurney,  January 06, 2010 3:17 PM  Follow-up for Phone Call        Continues to take warfarin, but hasn't been coming in for check.  Reminded her to come in.  She will schedule a lab visit for next week. Follow-up by: Graciella Belton MD,  January 06, 2010 4:08 PM

## 2010-08-18 NOTE — Consult Note (Signed)
[facility]   Imported By: Audie Clear 12/03/2009 15:35:50  _____________________________________________________________________  External Attachment:    Type:   Image     Comment:   External Document

## 2010-08-18 NOTE — Progress Notes (Signed)
Summary: Rx Req  Phone Note Refill Request Call back at Home Phone [phone] Message from:  Patient  Refills Requested: Medication #1:  WARFARIN SODIUM 5 MG  TABS Take as directed [facility].  Initial call taken by: Raymond Gurney,  March 10, 2010 3:25 PM  Follow-up for Phone Call        rx sent electronically and advised  patient to schedule appointment. Follow-up by: Marcell Barlow RN,  March 10, 2010 3:40 PM

## 2010-08-18 NOTE — Consult Note (Signed)
Summary: [facility]   Imported By: Raymond Gurney 09/08/2009 15:59:17  _____________________________________________________________________  External Attachment:    Type:   Image     Comment:   External Document

## 2010-08-18 NOTE — Consult Note (Signed)
Summary: [facility]  [facility]   Imported By: Raymond Gurney 12/31/2009 16:34:31  _____________________________________________________________________  External Attachment:    Type:   Image     Comment:   External Document  Appended Document: [facility] consult read. Pt may need cardiac cath.

## 2010-08-18 NOTE — Consult Note (Signed)
Summary: [facility]   Imported By: Raymond Gurney 10/05/2009 15:23:15  _____________________________________________________________________  External Attachment:    Type:   Image     Comment:   External Document

## 2010-08-18 NOTE — Consult Note (Signed)
Summary: [place]   Imported By: Audie Clear 03/29/2010 10:03:46  _____________________________________________________________________  External Attachment:    Type:   Image     Comment:   External Document

## 2010-08-18 NOTE — Assessment & Plan Note (Signed)
Summary: meet dr, tcb   Vital Signs:  Patient profile:   54 year old female Height:      63.75 inches Weight:      153 pounds BMI:     26.56 Temp:     98.3 degrees F oral Pulse rate:   76 / minute BP sitting:   124 / 76  (right arm) Cuff size:   regular  Vitals Entered By: Enid Skeens, CMA (May 10, 2010 2:33 PM) CC: meet new MD Is Patient Diabetic? Yes Did you bring your meter with you today? No Pain Assessment Patient in pain? no        Primary Provider:  Boykin Nearing MD  CC:  meet new MD.  History of Present Illness: Pt here to meet me and to f/u meds.   Meds reviewed. Added Spirinolactone which was prescribed by pt/s hepatologist and changed atenolol to 1/2 tab daily.   Diabetes- pt on Metformin. denies polydipsia/polyuria. Had and eye exam a few mos ago, nml. Denies weight gain (has lost weight since being unemployed. Pt walks daily for exercise). Denies fatigue.   Ascities- improved when patient started taking Spiranalactione. Cardiac cath showed slightly low EF, but severe tricuspid regurg and RA enlargement. Per card, MR is likely the cause of ascites.  Pt is to continue spironolactone. Currently reports improved abdominal edema, no LE edema, no SOB.   A-fib-pt rate controlled. denies palpitations, syncope. Has not had INR checked in a while (last 8/30 --> 1.7)  Preventive Screening-Counseling & Management  Alcohol-Tobacco     Smoking Status: never  Allergies: No Known Drug Allergies  Past History:  Past Medical History: echo 10/2004- LA dilated, mmr, ef 66%, Echo 06/04 nl EF, mild MR, minimal MS,  Mitral valve repair  2/2 severe MR, SSS + PAF 1/01 -, Pacemaker 1/01 2 Bradycardia Atrial fibrillation cardiologist: Dr Rollene Fare at [facility] RA enlargement and tricuspid regurg.   Review of Systems       The patient complains of weight loss.  The patient denies anorexia, fever, vision loss, decreased hearing, chest pain, syncope,  dyspnea on exertion, peripheral edema, prolonged cough, headaches, abdominal pain, and abnormal bleeding.         LMP 3 years ago.  Physical Exam  General:  Well-developed,well-nourished,in no acute distress; alert,appropriate and cooperative throughout examination Lungs:  Normal respiratory effort, chest expands symmetrically. Lungs are clear to auscultation, no crackles or wheezes. Heart:  Normal rate and regular rhythm. S1 and S2 normal without gallop, murmur, click, rub or other extra sounds. Abdomen:  NABS, NT, ND.  Extremities:  No clubbing, cyanosis, edema, or deformity noted with normal full range of motion of all joints.     Impression & Recommendations:  Problem # 1:  OTHER ASCITES (ICD-789.59) Assessment Improved  Found to be 2/2 to MR. Improved. Will continue Spironolactione.   Orders: [place]- Est Level  3 [id number])  Problem # 2:  DIABETES-TYPE 2 (ICD-250.00) Will check A1c today to assess long term control.  Will also check lipids and BMET for renal fnc as well as urine microalbumin. Her updated medication list for this problem includes:    Metformin Hcl 500 Mg Xr24h-tab (Metformin hcl) .Marland Kitchen... 1 tab by mouth once daily  Orders: A1C-[facility] [id number]) Basic Met-[facility] [id number]) Lipid-[facility] [id number]) UA Microalbumin-[facility] [id number]) [facility]- Est Level  3 [id number])  Problem # 3:  ATRIAL FIBRILLATION (ICD-427.31) Rate controlled with pacemaker. On coumadin. Missed a few INR checks. Will check INR today. Her updated  medication list for this problem includes:    Warfarin Sodium 5 Mg Tabs (Warfarin sodium) .Marland Kitchen... Take as directed    Atenolol 100 Mg Tabs (Atenolol) .Marland Kitchen... 1/2  tab by mouth daily    Diltiazem Hcl Cr 240 Mg Xr24h-cap (Diltiazem hcl) .Marland Kitchen... 1 tab by mouth daily  Orders: INR/PT-[facility] [id number]) [place]- Est Level  3 [id number])  Problem # 4:  Preventive Health Care (ICD-V70.0) Pt due for colonoscopy.  Assymptomatic. Will set up at next f/u appt.  Flu shot given today.  Complete  Medication List: 1)  Warfarin Sodium 5 Mg Tabs (Warfarin sodium) .... Take as directed 2)  Atenolol 100 Mg Tabs (Atenolol) .... 1/2  tab by mouth daily 3)  Diltiazem Hcl Cr 240 Mg Xr24h-cap (Diltiazem hcl) .Marland Kitchen.. 1 tab by mouth daily 4)  Metformin Hcl 500 Mg Xr24h-tab (Metformin hcl) .Marland Kitchen.. 1 tab by mouth once daily 5)  Spironolactone 50 Mg Tabs (Spironolactone) .... One tab q day  Other Orders: Flu Vaccine 10yrs + [phone]) Admin 1st Vaccine [phone])   Orders Added: 1)  INR/PT-[facility] [85610] 2)  A1C-[facility] [83036] 3)  Basic Met-[facility] [id number] 4)  Lipid-[facility] [[id number]] 5)  Flu Vaccine 1yrs + [90658] 6)  Admin 1st Vaccine [90471] 7)  UA Microalbumin-[facility] [82044] 8)  [place]- Est Level  3 [id number]   Immunizations Administered:  Influenza Vaccine # 1:    Vaccine Type: Fluvax 3+    Site: left deltoid    Mfr: GlaxoSmithKline    Dose: 0.5 ml    Route: IM    Given by: Enid Skeens, CMA    Exp. Date: 01/11/2011    Lot #: [id number]    VIS given: 02/08/10 version given May 10, 2010.  Flu Vaccine Consent Questions:    Do you have a history of severe allergic reactions to this vaccine? no    Any prior history of allergic reactions to egg and/or gelatin? no    Do you have a sensitivity to the preservative Thimersol? no    Do you have a past history of Guillan-Barre Syndrome? no    Do you currently have an acute febrile illness? no    Have you ever had a severe reaction to latex? no    Vaccine information given and explained to patient? yes    Are you currently pregnant? no   Immunizations Administered:  Influenza Vaccine # 1:    Vaccine Type: Fluvax 3+    Site: left deltoid    Mfr: GlaxoSmithKline    Dose: 0.5 ml    Route: IM    Given by: Enid Skeens, CMA    Exp. Date: 01/11/2011    Lot #: [id number]    VIS given: 02/08/10 version given May 10, 2010.   Laboratory Results   Urine Tests  Date/Time Received: May 10, 2010 3:45 PM  Date/Time Reported: May 10, 2010 7:47  PM   Microalbumin (urine): 30 mg/L Creatinine: 10mg /dL  A:C Ratio 30-300 Abnormal Comments: ...............test performed by......Marland KitchenBonnie A. Martinique, MLS (ASCP)cm   Blood Tests   Date/Time Received: May 10, 2010 3:45 PM  Date/Time Reported: May 10, 2010 7:48 PM   HGBA1C: 7.0%   (Normal Range: Non-Diabetic - 3-6%   Control Diabetic - 6-8%)  INR: 2.1   (Normal Range: 0.88-1.12   Therap INR: 2.0-3.5) Comments: ...............test performed by......Marland KitchenBonnie A. Martinique, MLS (ASCP)cm       ANTICOAGULATION RECORD PREVIOUS REGIMEN & LAB RESULTS Anticoagulation Diagnosis:  Atrial fibrillation on  05/24/2007 Previous INR Goal Range:  2-3 on  05/24/2007 Previous INR:  1.7 on  03/15/2010 Previous Coumadin Dose(mg):  5 mg daily on  05/24/2007 Previous Regimen:  7.5 mg - Tues & Sat;  5 mg - other days on  03/15/2010 Previous Coagulation Comments:  has been seeing Dr. Rollene Fare and having INR checked there on  01/11/2010  NEW REGIMEN & LAB RESULTS Current INR: 2.1 Regimen: 5 mg daily Coagulation Comments: pt has been on 5 mg since having heart cath Provider: Sara Keys Repeat testing in: 3 weeks  05-31-10 Other Comments: ...............test performed by......Marland KitchenBonnie A. Martinique, MLS (ASCP)cm   Dose has been reviewed with patient or caretaker during this visit. Reviewed by: Barkley Bruns (ASCP)cm  Anticoagulation Visit Questionnaire Coumadin dose missed/changed:  No Abnormal Bleeding Symptoms:  No  Any diet changes including alcohol intake, vegetables or greens since the last visit:  No Any illnesses or hospitalizations since the last visit:  No Any signs of clotting since the last visit (including chest discomfort, dizziness, shortness of breath, arm tingling, slurred speech, swelling or redness in leg):  No  MEDICATIONS WARFARIN SODIUM 5 MG  TABS (WARFARIN SODIUM) Take as directed ATENOLOL 100 MG  TABS (ATENOLOL) 1/2  tab by mouth daily DILTIAZEM HCL CR 240 MG  XR24H-CAP  (DILTIAZEM HCL) 1 tab by mouth daily METFORMIN HCL 500 MG  XR24H-TAB (METFORMIN HCL) 1 tab by mouth once daily SPIRONOLACTONE 50 MG TABS (SPIRONOLACTONE) one tab q day

## 2010-08-18 NOTE — Miscellaneous (Signed)
  Clinical Lists Changes  Problems: Changed problem from MITRAL REGURGITATION, SEVERE, S/P REPAIR (ICD-396.3) to History of  MITRAL REGURGITATION, 3-4 PLUS (ICD-396.3)

## 2010-08-18 NOTE — Consult Note (Signed)
Summary: [facility]   Imported By: Audie Clear 09/22/2009 09:06:28  _____________________________________________________________________  External Attachment:    Type:   Image     Comment:   External Document

## 2010-08-18 NOTE — Consult Note (Signed)
Summary: [facility]   Imported By: Raymond Gurney 12/13/2009 09:00:55  _____________________________________________________________________  External Attachment:    Type:   Image     Comment:   External Document

## 2010-08-27 ENCOUNTER — Encounter: Payer: Self-pay | Admitting: Family Medicine

## 2010-08-27 DIAGNOSIS — Z7901 Long term (current) use of anticoagulants: Secondary | ICD-10-CM | POA: Insufficient documentation

## 2010-08-27 DIAGNOSIS — I4891 Unspecified atrial fibrillation: Secondary | ICD-10-CM

## 2010-08-28 ENCOUNTER — Encounter: Payer: Self-pay | Admitting: *Deleted

## 2010-09-01 NOTE — Consult Note (Signed)
Summary: [facility] By: Samara Snide 08/11/2010 10:43:31  _____________________________________________________________________  External Attachment:    Type:   Image     Comment:   External Document

## 2010-09-14 ENCOUNTER — Ambulatory Visit (INDEPENDENT_AMBULATORY_CARE_PROVIDER_SITE_OTHER): Payer: 59 | Admitting: *Deleted

## 2010-09-14 ENCOUNTER — Encounter: Payer: Self-pay | Admitting: Family Medicine

## 2010-09-14 ENCOUNTER — Ambulatory Visit (INDEPENDENT_AMBULATORY_CARE_PROVIDER_SITE_OTHER): Payer: 59 | Admitting: Family Medicine

## 2010-09-14 VITALS — BP 126/81 | HR 83 | Temp 98.1°F | Ht 63.75 in | Wt 160.9 lb

## 2010-09-14 DIAGNOSIS — Z7901 Long term (current) use of anticoagulants: Secondary | ICD-10-CM

## 2010-09-14 DIAGNOSIS — I4891 Unspecified atrial fibrillation: Secondary | ICD-10-CM

## 2010-09-14 DIAGNOSIS — E119 Type 2 diabetes mellitus without complications: Secondary | ICD-10-CM

## 2010-09-14 MED ORDER — ONETOUCH ULTRA SYSTEM W/DEVICE KIT: 1.0000 | PACK | Freq: Once | Status: DC

## 2010-09-14 MED ORDER — METFORMIN HCL ER 500 MG PO TB24: 1000.0000 mg | ORAL_TABLET | Freq: Every day | ORAL | Status: DC

## 2010-09-14 MED ORDER — WARFARIN SODIUM 5 MG PO TABS: 5.0000 mg | ORAL_TABLET | ORAL | Status: DC

## 2010-09-14 NOTE — Assessment & Plan Note (Signed)
Diabetes: deteriorated. A1c 7.6 (7.0). Discussed options with patient. Regarding adding another medication like glipizide vs. Increasing metformin vs. Diet and exercise.  Plan: increase metformin to 1000 q D, increase physical activity (walking etc.), decrease snacking (chew gum while in class). Order glucometer so pt can monitor blood sugars at home.

## 2010-09-14 NOTE — Assessment & Plan Note (Signed)
A-fib: Stable. Rate controlled and anticoagulated.  INR check today. Refill coumadin.

## 2010-09-14 NOTE — Patient Instructions (Signed)
Krystal Little,  Thank you for coming in today.   For you diabetes: Let's try watchings your diet, increasing exercise and taking your metformin at the increased dose of 1000 mg once daily. I will recheck your A1c again in 3 mos. Check you blood sugar 1-2 x a day 3-4x/day to get a feel for which foods/activities affect your sugar either positively or negatively.   I am sending a referral for a mammogram, you will be called with the appointment/number to make an appointment.

## 2010-09-14 NOTE — Progress Notes (Signed)
  Subjective:    Patient ID: Krystal Little, female    DOB: [birth year], 55 y.o.   MRN: [id number]  HPI Here to f/u DM and A-Fib.  DM- Taking metformin. 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 8 lb weight gain since last visit.  Ros: denies HA, blurred vision, CP, N/V, tingling or numbness in extremities, increased fatigue, polyuria or polydipsia. Admits to occasional diarrhea with metformin that causes here to skip doses at times, but she states she takes it most days.   A-fib- Pt f/u with cardiologist. Missed last INR check in Nov. Has been on same dose of coumadin for many years now. Denies CP, syncope, palpitations. Pacemaker did not need to be adjusted at last cardiology visit.     Review of Systems pertinent review of symptoms reviewed in HPI.      Objective:   Physical Exam  Constitutional: She appears well-developed and well-nourished.  HENT:  Head: Normocephalic.  Eyes: Pupils are equal, round, and reactive to light.  Cardiovascular: Normal rate, regular rhythm and normal heart sounds.   Pulmonary/Chest: Effort normal and breath sounds normal.  Feet: proprioception intact, no maceration between toes, 2+ DP pulses b/l. No ulcers on soles. monofilament exam normal and intact sensation b/l.         Assessment & Plan:  1. Diabetes: deteriorated. A1c 7.6 (7.0). Discussed options with patient. Regarding adding another medication like glipizide vs. Increasing metformin vs. Diet and exercise.  Plan: increase metformin to 1000 q D, increase physical activity (walking etc.), decrease snacking (chew gum while in class).   2. A-fib: Stable. Rate controlled and anticoagulated.  INR check today. Refill coumadin.   3. Health maintenance- pt due for mammogram- last 2-3 yrs ago. Referral today.

## 2010-10-12 ENCOUNTER — Ambulatory Visit (INDEPENDENT_AMBULATORY_CARE_PROVIDER_SITE_OTHER): Payer: 59 | Admitting: *Deleted

## 2010-10-12 DIAGNOSIS — Z7901 Long term (current) use of anticoagulants: Secondary | ICD-10-CM

## 2010-10-12 DIAGNOSIS — I4891 Unspecified atrial fibrillation: Secondary | ICD-10-CM

## 2010-10-12 LAB — POCT INR: INR: 2.7

## 2010-10-25 LAB — COMPREHENSIVE METABOLIC PANEL
ALT: 16 U/L (ref 0–35)
AST: 23 U/L (ref 0–37)
Alkaline Phosphatase: 165 U/L — ABNORMAL HIGH (ref 39–117)
CO2: 26 mEq/L (ref 19–32)
Calcium: 9.5 mg/dL (ref 8.4–10.5)
Chloride: 105 mEq/L (ref 96–112)
GFR calc Af Amer: >60 mL/min (ref >60)
GFR calc non Af Amer: >60 mL/min (ref >60)
Glucose, Bld: 135 mg/dL — ABNORMAL HIGH (ref 70–99)
Potassium: 3.7 mEq/L (ref 3.5–5.1)
Sodium: 138 mEq/L (ref 135–145)

## 2010-10-25 LAB — LIPID PANEL
HDL: 50 mg/dL (ref >39)
LDL Cholesterol: 112 mg/dL — ABNORMAL HIGH (ref 0–99)

## 2010-10-25 LAB — PROTIME-INR: INR: 1.7 — ABNORMAL HIGH (ref 0.00–1.49)

## 2010-10-25 LAB — GLUCOSE, CAPILLARY: Glucose-Capillary: 132 mg/dL — ABNORMAL HIGH (ref 70–99)

## 2010-10-25 LAB — HEMOGLOBIN A1C: Hgb A1c MFr Bld: 8.6 % — ABNORMAL HIGH (ref 4.6–6.1)

## 2010-11-09 ENCOUNTER — Ambulatory Visit (INDEPENDENT_AMBULATORY_CARE_PROVIDER_SITE_OTHER): Payer: 59 | Admitting: *Deleted

## 2010-11-09 DIAGNOSIS — I4891 Unspecified atrial fibrillation: Secondary | ICD-10-CM

## 2010-11-09 DIAGNOSIS — Z7901 Long term (current) use of anticoagulants: Secondary | ICD-10-CM

## 2010-11-09 LAB — POCT INR: INR: 2.6

## 2010-11-29 NOTE — Consult Note (Signed)
NAMEJACQUELYNE, [name]              ACCOUNT NO.:  [id number]   MEDICAL RECORD NO.:  [id number]          PATIENT TYPE:  OUT   LOCATION:  GYN                          FACILITY:  [facility]   PHYSICIAN:  Paola A. Alycia Rossetti, MD    DATE OF BIRTH:  Jan 22, 1956   DATE OF CONSULTATION:  03/17/2009  DATE OF DISCHARGE:                                 CONSULTATION   REFERRING PHYSICIAN:  Frederico Hamman, M.D.   The patient is seen today in consultation at the request of Dr.  Ruthann Cancer.  Krystal [name] is a very pleasant 55 year old gravida 2, para 2  who is referred to [place] secondary to abdominal pelvic ascites and a  concern for an ovarian malignancy.  She states that she was in her usual  state of health until about 3 months ago when she noticed a steady  increase in her abdominal girth.  This somewhat coincided with the  unfortunate demise of her son at the age of 37 in May of 2010 from a  motor vehicle accident.  She states that there really is not any  abdominal pain, just a tightness.  She denies any nausea, vomiting.  She  does state that she usually walks about a mile per day and has noted  that she is a little bit more fatigued after she walks during that  period of time.  She denies any chest pain, shortness of breath, any  change in her bladder habits.  She does occasionally have some  constipation but she has not had that problem recently as she has been  eating more fiber,  more fruits and vegetables.  She does state that she  has a bit of a decreased appetite and has some early satiety but some of  that has been due to her grieving process from her son's demise.  She  denies any chest pain or shortness of breath as stated above.  She is  not menopausal.  Her last period was July of 2010.  Prior to that it had  been about 3-4 months and otherwise had been fairly regular.  She had  thought that since she did not have a period for 3-4 months since the  earlier  part of the spring that she might  be menopausal, but then had a  cycle that was otherwise completely normal in July.  She denies any  vasomotor symptoms or hot flashes.   PAST MEDICAL HISTORY:  Is fairly complicated and is significant for:  1. History of a mitral valve repair in January of 2001 at [facility].  2. She has had a pacemaker placed both in 2001 at time of the original      surgery as well as a replacement in May of 2010.  3. She has a history of diastolic congestive heart failure but has no      symptoms.  4. She states that she had fluid on her lungs before and needed to      take fluid pills and that improved.  5. She does have atrial fibrillation.  6. Diabetes.  7. She is  followed regularly for her cardiac disease by Dr. Rollene Fare.  8. She is followed by the [facility] under the care of Dr Graciella Belton.   MEDICATIONS:  1. Include Coumadin 5 mg daily.  2. Atenolol 100 mg daily.  3. Diltiazem CR 240 mg daily.  4. Metformin HCl 500 mg twice daily.  5. Her last A1c was 7.6.   ALLERGIES:  None.   PAST SURGICAL HISTORY:  As above for the cardiac surgery, one  cardioversion, mitral valve repair in 2001, pacemaker replacement in  2001 and in 2010.   SOCIAL HISTORY:  She states she does not use tobacco or alcohol.  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   FAMILY HISTORY:  Her sister had diabetes.  Mother has hypertension.  There is no cancer.   HEALTH MAINTENANCE:  She is up-to-date on her mammograms having had one  in 2009.  She has not yet had a colonoscopy.  She states her her  doctors are on her.  She had a Pap smear in August that was negative.  Her last echocardiogram was in 2006 that showed an ejection fraction of  66 with some left atrial dilatation.   PHYSICAL EXAMINATION:  Weight 153 pounds, height 5 feet 3 inches.  Blood  pressure 130/78, well-nourished, well-developed female in no acute  distress.  NECK:  Supple.  There is no lymphadenopathy.  There is no thyromegaly.  She does  have fairly significant jugular venous distention.  LUNGS:  Clear to auscultation bilaterally.  CARDIOVASCULAR EXAM:  Regular rate and rhythm.  ABDOMEN:  Diffusely distended.  She has a positive fluid wave.  She is  soft, nontender, nondistended.  There is no palpable mass or  hepatosplenomegaly, but exam is somewhat limited due to the ascites.  Groins are negative for adenopathy.  EXTREMITIES:  There is no edema.  PELVIC:  External genitalia is within normal limits.  Bimanual  examination:  The cervix is palpably normal.  I cannot appreciate the  size of the corpus due to the ascites.  There are no obvious adnexal  masses.  Rectovaginal examination confirms no nodularity.   ASSESSMENT:  A 55 year old with ascites on CT scan.  The CT scan shows  moderate cardiomegaly with a prominent left atrium.  The liver was noted  to be very inhomogeneous in enhancement consistent with changes of  cirrhosis and it is felt that the large to moderate ascites was most  consistent with changes of cirrhosis.  There was some nodularity however  of the anterior peritoneal fat that can exclude peritoneal  carcinomatosis versus just nodularity of the peritoneal fat and omentum  due to  post ascites.  Within the pelvis there is a large to moderate  amount of ascites scattered throughout the peritoneal cavity.  The  uterus is enlarged and lobular consistent with a prevalence of multiple  uterine fibroids which the patient states she has been aware of.  There  is one questionable soft tissue mass that probably represents a  pedunculated fibroid extending to the left of midline but they could not  rule out an ovarian lesion.  I discussed these results with her.  I  think the best and most prudent way to proceed would be a diagnostic  paracentesis.  The fluid could be sent to pathology for cytology and  appropriate cell block with cytokeratins if there is any malignancy  noted.  If the paracentesis is negative  for any evidence of malignancy  then I think  further workup and evaluation for question of cardiac  versus hepatic insufficiency leading to this problem needs to be  entertained.  We have attempted to contact Dr. Jeannine Kitten today to discuss  this with him to see if he would be willing to arrange this.  The  patient will need to come off of her Coumadin and we will need his  assistance in determining whether not she needs to be placed on a  Lovenox bridge or she can stop the Coumadin for the procedure and then  restart it.  If there is any evidence of malignancy at the time of  paracentesis we will need to refer her to her cardiologist for further  evaluation and medical optimization prior to major surgery for an  ovarian debulking.  The patient's questions were elicited and answered  to her satisfaction.  She likes the idea of proceeding with drawing off  the fluid.  She states she had fluid drawn off one time before and it  sounds like it might have been a  different situation completely as she may have had a thoracentesis after  her mitral valve repair but she likes the idea of having that drawn off  to know what she was dealing with.  We discussed with her that we will  contact her once we hear back from her primary physician.      Paola A. Alycia Rossetti, MD  Electronically Signed     PAG/MEDQ  D:  03/17/2009  T:  03/17/2009  Job:  [id number]   cc:   Delfino Lovett A. Rollene Fare, M.D.  Fax: [phone]   Frederico Hamman, M.D.  Fax: [phone]   Graciella Belton, MD

## 2010-12-02 NOTE — Consult Note (Signed)
[facility]  Patient:    Krystal Little                        MRN: [id number] Proc. Date: 08/04/99 Adm. Date:  [id number] Attending:  Othella Boyer CC:         Richard A. Rollene Fare, M.D.             Lorretta Harp, M.D.             Fransico Him, M.D.                          Consultation Report  REQUESTING PHYSICIANS:  Richard A. Rollene Fare, M.D. and Lorretta Harp, M.D.  PRIMARY CARE PHYSICIAN:  Rushie Nyhan, M.D.  REASON FOR CONSULTATION:  Severe mitral regurgitation.  HISTORY OF PRESENT ILLNESS:  The patient is a 55 year old African-American female with a history of rheumatic fever as a child and subsequent long history of heart murmur.  She has been followed for the last several years by Dr. Rollene Fare with  moderate to severe mitral regurgitation.  In May 1996, she underwent transthoracic echocardiogram which demonstrated moderate to severe mitral regurgitation with ome enlargement of the left atrium.  At that time, she remained in sinus rhythm and  remained asymptomatic.  She has been followed medically since that time.  She now presents with new onset atrial fibrillation and syncopal episode associated with profound bradycardia.  The patient reports that she has actually had several episodes of syncope in the past and, in fact, approximately one year ago she underwent formal neurological work up due to episodes of syncope.  She had been  doing well recently, until August 01, 1999, when she developed her most recent and most profound syncopal episode.  She initially developed symptoms of palpitations with rapid heart rate and subsequently passed out, prompting hospital admission. Electrocardiogram performed in the emergency room demonstrated atrial fibrillation with rapid ventricular response, followed by episodes of documented symptomatic  bradycardia with six to seven second pauses of asystole.   Initially, the patient underwent transthoracic echocardiogram on August 01, 1999.  This demonstrated moderate to severe mitral regurgitation with severe left atrial enlargement and  normal left ventricular function.  There was mild tricuspid regurgitation.  The  aortic valve appeared normal.  A follow-up transesophageal echocardiogram was performed by Dr. Radford Pax on August 02, 1999.  This confirmed the presence of essentially normal appearing left ventricular function with left atrial enlargement and moderate to severe mitral regurgitation.  There was mild mitral annular calcification and only slightly thickened anterior leaflet of the mitral valve. I personally reviewed the echocardiogram tape and did not appreciate any significant mitral valve prolapse.  The posterior leaflet of the mitral valve appears somewhat restricted in motion and there is failure of normal coaptation of the anterior nd posterior leaflets with subsequent severe regurgitant jet.  There did not appear to be flow reversal in the pulmonary veins.  There was no left atrial thrombus, although the left atrium was quite dilated.  There is normal aortic root and aortic valve.  Subsequently, Ms. Osterlund underwent left and right heart catheterization on August 03, 1999.  This confirmed the presence of normal coronary arteries with  moderate to severe mitral regurgitation and mild pulmonary hypertension.  PA pressure measured 41/18, with pulmonary capillary wedge pressure averaging 20, ith a V-wave of 38.  Cardiac output was measured at 2.8  L/min by thermodilution and  3.3 L/min by the Fick method.  Left ventricular end-diastolic pressure was 20 mmHg.  REVIEW OF SYMPTOMS:  The patient denies any history of chest pain, exertional chest tightness, exertional shortness of breath, PND, orthopnea, or lower extremity edema.  She denies any history of hemoptysis, hematochezia, hematemesis, or melena. She reports the  only other significant findings on review of systems include occasional migraine headaches which she associates with menstrual periods and occasional episodes of diplopia, which have not occurred in quite sometime. At the time of hospital admission, she notes that she had some mild pain in her  lower abdomen, slightly to the right of midline, which has subsequently resolved. She denies any constipation or recent change in bowel habits.  She denies any dysuria or hematuria.  She denies any loose teeth or problems with dentition. he sees her dentist regularly and follows up with endocarditis prophylaxis when she has dental work performed.  The remainder of her review of systems is unremarkable.  PAST MEDICAL HISTORY:  Her past medical history is notable for only history of rheumatic fever as a child with subsequent heart murmur.  She denies any history of diabetes, hypertension, hypercholesterolemia, or other chronic medical illnesses. She has had no previous surgical procedures.  SOCIAL HISTORY:  The patient is married and has four children living with her at home with her husband.  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  She has no history of tobacco or alcohol use.   CURRENT MEDICATIONS:  None prior to admission.  ALLERGIES:  Ms. Schlender denies any known drug allergies or sensitivities.  PHYSICAL EXAMINATION:  GENERAL:  The patient is a well-appearing African-American female who appears her stated age and in good health.  She is in sinus rhythm presently and remains afebrile.  HEENT:  Grossly within normal limits.  There is no cervical or supraclavicular lymphadenopathy.  There is no jugular venous distention.  CHEST:  Auscultation of chest demonstrates clear lung fields bilaterally.  No wheezes or rhonchi are noted.  CARDIOVASCULAR:  Notable for regular rate and rhythm.  There is a grade [id number] holosystolic murmur which is  heard best at the apex with radiation toward the axilla.  There is no diastolic murmur appreciated.   ABDOMEN:  Soft and nontender.  The liver edge is not palpated.  No masses are identified.  There is no tenderness on deep palpation in the abdominal examination at present.  EXTREMITIES:  Warm and well perfused.  Distal pulses are 2+ and symmetrical bilaterally.  There is no lower extremity venous insufficiency.  NEUROLOGICAL:  Grossly nonfocal and symmetrical throughout.  LABORATORY DATA:  A complete blood count was obtained on August 01, 1999, which was notable for elevated white count of 13,100, hemoglobin 15.2, and hematocrit  43%.  Platelet count was 280,000.  White blood count has subsequently decreased and was 6,600 on August 03, 1999.  Room air blood gas obtained August 01, 1999, was notable for a pH of 7.44, pCO2 33, pO2 91, and bicarbonate 22.  Serum electrolytes obtained on August 01, 1999, were notable for sodium 142, potassium 3.4, chloride 103, bicarbonate was not measured, and creatinine was 0.6.  Glucose was mildly elevated at 131.  Admission coagulation profile was notable for a prothrombin time of 13.5 seconds with an INR of 1.1 and a PTT of 23 seconds.  Urinalysis obtained on August 01, 1999, was notable for many bacteria and 6-10  white cells  with many epithelial cells.  The urine culture was notable for greater than 100,000 colonies of multiple species.  CT scan of the abdomen and pelvis obtained on August 01, 1999, is notable only for somewhat enlarged uterus with prominent endometrial cavity and multiple small fibroids.  There was a small amount of free-fluid in the cul-de-sac.  The appendix was slightly prominent, but did not show any definite inflammation or abscess, although oral contrast was not given prior to this examination.  A 12-lead electrocardiogram obtained on August 01, 1999, demonstrated atrial fibrillation.  Subsequent  electrocardiograms have demonstrated normal sinus rhythm, with occasional premature supraventricular complexes.  IMPRESSION:  Moderate to severe mitral regurgitation, which is longstanding and  associated with progressive left atrial enlargement and new onset atrial fibrillation in a patient with history of rheumatic heart disease.  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  Nevertheless, this may represent rheumatic disease as the posterior leaflet appears somewhat restricted and this may, in fact, be the etiology of her underlying severe mitral regurgitation.  As a result, this may represent a more favorable reason for possible mitral valve repair, even if the  underlying etiology is rheumatic.  I agree with the opinion of Dr. Rollene Fare, Dr. Gwenlyn Found, and colleagues, that with new onset atrial fibrillation and progressive left atrial enlargement and elevated right-sided pressures, it is reasonable to  proceed with elective surgical intervention for possible mitral valve repair or  replacement.  Ms. Janikowski will obviously also require permanent pacemaker for her episode of severe symptomatic bradycardia with syncopal episode prior to admission. The only question at this point represents her mild abdominal pain with elevated white count at the time of admission.  This most likely represents ongoing urinary tract infection.  At this point, she has been partially treated and seems to be  responding appropriately.  This needs to be completely treated prior to proceeding with elective surgical intervention.  PLAN:  We will continue to follow with continued medical therapy for Ms. Filipovic presumed urinary tract infection.  We can consider elective surgical intervention at some point next week at the earliest, if she continues to do  well. Alternatively, she could be discharged home with plans for elective surgical intervention in the future.  However, due to her recent episode of symptomatic bradycardia, she would likely require permanent pacemaker prior to hospital discharge.  It would seem most appropriate to likely hold-off on permanent pacemaker until after surgical intervention is performed.  We will continue to follow closely and tentatively plan for elective surgery at some time next week, presuming that Ms. Harshman presumed urinary tract infection can be adequately eradicated prior to surgery. DD:  08/04/99 TD:  08/04/99 Job: [id number]

## 2010-12-02 NOTE — Op Note (Signed)
[place]. [facility]  Patient:    Krystal Little                        MRN: [id number] Proc. Date: 08/09/99 Adm. Date:  [id number] Attending:  Darylene Price Dictator:   Valentina Gu. [name], M.D. CC:         Richard A. Rollene Fare, M.D.             Lorretta Harp, M.D.             Fransico Him, M.D.             Rushie Nyhan, M.D.                           Operative Report  PREOPERATIVE DIAGNOSIS:  Moderate to severe mitral regurgitation.  POSTOPERATIVE DIAGNOSIS:  Moderate to severe mitral regurgitation.  PROCEDURE:  Median sternotomy for mitral valvuloplasty (quadrangular resection nd sliding leaflet annuloplasty of posterior leaflet, chortle transfer to anterior  leaflet with #32 Seguin ring annuloplasty).  SURGEON:  Valentina Gu. [name], M.D.  ASSISTANT:  Len Childs, M.D.  ANESTHESIA:  General.  BRIEF CLINICAL NOTE:  The patient is a 55 year old African-American female with  long-standing history of mitral regurgitation who now presents with new onset atrial fibrillation, as well as syncopal episode with symptomatic bradycardia. The patient has been documented to have worsening mitral regurgitation with enlarging cardiac dementions and markedly enlarged left atrial dimensions over the last four years.  Transesophageal echocardiogram demonstrates severe mitral regurgitation  with what appeared to be a small area of prolapse involving the posterior leaflet in the mitral valve, somewhat restricted motion of the remainder of the posterior leaflet of the mitral valve, mild prolapse of the anterior lead inserted in the  mitral valve, and normal left ventricular function.  Coronary arteries are found to be normal on left and right heart catheterization.  Left ventricular function is normal.  OPERATIVE CONSENT:  The patient and her husband are counseled at length regarding the indications and potential benefits of elective mitral  valve repair and/or replacement.  They understand the rationale for proceeding with surgical intervention at this point in time, as opposed to continued medical management ith late follow-up.  They accept all associated risks of surgery, including, but not limited to risk of death, stroke, myocardial infarction, congestive heart failure, bleeding requiring blood transfusion, arrhythmia, heart block requiring permanent pacemaker, infection, recurrent mitral regurgitation, and/or prosthetic valve disease.  They accept these risks, as well as any unforeseen complications and agree to proceed with surgery as described.  DESCRIPTION OF PROCEDURE:  The patient is brought to the operating room on the above mentioned date and invasive hemodynamic monitoring is established by the anesthesia service under the care and direction of Dr. Lillia Abed.  Following induction with general endotracheal anesthesia, preoperative transesophageal echocardiogram is performed by Dr. Conrad .  This confirms the presence of moderate to severe mitral regurgitation with an area of prolapse involving the posterior  leaflet of the mitral valve.  Left ventricular function appears normal.  No other abnormalities are identified.  The patients chest, abdomen, both groins, and both lower extremities are prepared and draped in sterile manner.  A median sternotomy incision is performed.  The patient is heparinized systemically.  The pericardium is opened.  Of note, there are dense adhesions between the visceral wound parietal surfaces of the pericardium which require lysis of  adhesions using sharp dissection.  The ascending aorta is cannulated for cardiopulmonary bypass.  A retrograde cardioplegic catheter is placed through the right atrium into the coronary sinus.  Dual venous cannulation is obtained, placing a cannula directly into the superior vena cava, and a second cannula low in the right atrium down into the  inferior vena cava.  Adequate heparinization is verified.  Cardiopulmonary bypass is begun.  The cardioplegic catheter is placed in the ascending aorta.  The intra-atrial groove is dissected from associated structures. Of note, the patients left atrium is very large.  Umbilical tapes are placed around both superior and inferior vena cava.  The patient is cooled to 28 degrees systemic temperature.  The aortic cross clamp is applied and cardioplegia is delivered initially through the aortic root. Subsequent doses of cardioplegia are administered retrograde through the coronary sinus catheter.  Intermittently throughout the cross clamp portion of the operation to maintain septal temperature below 15 degrees Centigrade.  A left atriotomy is performed through the antra-atrial groove posteriorly. Exposure of the mitral valve is ascertained.  Careful examination is performed nd the left ventricle is filled with cold saline solution to inspect the baseline anatomy of the mitral valve.  Of note, the mitral valve looks to have chronic degenerative disease with thickening involving all of the leaflets of the mitral valve and prolapse involving the posterior and anterior leaflets.  There is absolutely no evidence of rheumatic valvular heart disease whatsoever.  On careful inspection one can appreciate that there is an area of prolapse involving the posterior leaflet and mitral valve located somewhat eccentrically between the second and third portions of the posterior leaflet (P2 and P3).  This area of prolapse causes redundancy such that normal coaptation of the anterior and posterior leaflets in this region is impossible.  This was the location of the primary jet of mitral regurgitation.  Immediately opposed to this on the edge of the anterior leaflet of the mitral valve there is an area of prolapse of the anterior leaflet in which the free edge of the anterior leaflet is  prolapsed backwards into the left atrium.  There is no corresponding chordi tendineae along  the free edge of the leaflet in this vicinity.  The underlying chordi tendineae  both secondary and primary cords in that vicinity are elongated and stretched.   Quadrangular resection of the posterior leaflet is performed comprising a segment approximately 14 mm in width, including a generous portion of the P2 portion of the posterior leaflet.  One of the primary cords from this region and secondary cords are shortened and coalesced using 4-0 Ethibond suture.  These cords are subsequently transposed to the free edge of the anterior leaflet on the underside of the free edge, and secured in place using several interrupted 4-0 Ethibond sutures.  The remaining posterior leaflet is mobilized from the posterior annulus and a sliding leaflet annuloplasty is performed.  Prior to the completion of the annuloplasty several compression sutures with 2-0 Ethibond sutures are utilized to shorten the posterior circumference of the posterior annulus.  This sliding leaflet annuloplasty is completed using two layers of running 4-0 Ethibond suture.  The  remaining vertical defect in the posterior leaflet is subsequently closed using  interrupted 4-0 Ethibond sutures.  Ring annuloplasty sutures are subsequently placed circumferentially around the mitral annulus using 2-0 Ethibond sutures. The annulus is sized to a 32 mm Seguin annuloplasty ring.  The 32 mm Seguin annuloplasty ring is sewn in place with interrupted horizontal  mattress sutures as placed previously.  After appropriate feeding of the ring the valve is tested using iced saline solution injected directly into the left ventricle with a red rubber catheter.  Valve integrity and function appears to be normal.  The left atriotomy is closed using two layers of running 3-0 Prolene suture. The patient is re-warmed to greater than 37 degrees  Centigrade temperature. Immediately prior to completion of the closure of the left atriotomy, hotshot retrograde cardioplegia is administered, and the patient is placed in steep Trendelenburg position, and the heart is allowed to fill with blood to remove any air from the pulmonary veins, left atrium, left ventricle, and aortic root. The atriotomy closure is completed.  The heart is again de-aired using numerous devices and maneuvers to remove any possible air from the aortic root and remaining left heart circulation.  The aortic cross clamp is removed after a total cross clamp  time of 115 minutes.  The heart resumes rhythm spontaneously without need for cardioversion.  The umbilical tapes are removed and the retrograde cardioplegic catheter is removed. Epicaudal and pacing wires are fixed at the right ventricular outflow tract and to the right atrial appendage.  The patient is weaned from cardiopulmonary bypass without difficulty.  The patients rhythm at separation from bypass is a slow junctional bradycardia requiring AV sequential dual chamber pacing.  Total cardiopulmonary bypass time for the operation is 162 minutes.  Immediately following separation from bypass, follow-up transesophageal echocardiogram is performed by Dr. Conrad Benjamin.  This demonstrates normal function of the mitral valve with a well seated ring annuloplasty and insignificant air within he left heart circulation.  The anterior leaflet and posterior leaflet appear to move normally, and there is no sign of residual mitral regurgitation.  No other abnormalities are identified.  Left heart function appears well preserved.  The venous cannulas are removed uneventfully.  The arterial cannula is removed uneventfully.  Protamine is administered to reverse the anticoagulation.  The mediastinum and the right chest are irrigated with saline solution containing vancomycin antibiotic.  Meticulous surgical hemostasis  ascertained.  The mediastinum and the right chest are drained with three chest tubes placed through separate stab incisions inferiorly.  The median sternotomy is closed in  routine fashion.  The skin incision is closed with a subcuticular skin closure.  The patient tolerated the procedure well.  The patient remained hemodynamically  stable throughout the post-bypass portion of the operation.  No autologous blood products were administered.  The patient resumed sinus rhythm spontaneously prior to completion of the operation.  The patient is transferred to the surgical intensive care unit in stable condition.  There are no intraoperative complications.  All sponge, instrument, and needle counts are verified correct t the completion of the operation. DD:  08/09/99 TD:  08/10/99 Job: [id number]

## 2010-12-02 NOTE — H&P (Signed)
[place]. [facility]  Patient:    Krystal Little                        MRN: [id number] Adm. Date:  [id number] Attending:  Othella Boyer Dictator:   Donnelly Angelica, P.A.                         History and Physical  CHIEF COMPLAINT:  Loss of consciousness.  HISTORY OF PRESENT ILLNESS:  The patient is a 55 year old female who reported abdominal pain which started after 1 p.m. today after eating two hot dogs. This was followed by some shaking and then feeling a little nauseated throughout the  rest of the day.  She did report some irregularity of her heart.  She laid around on the bed the rest of the day.  She then picked up a child off the floor to put in bed and started feeling dizzy, fell on the floor.  Her husband found her on the  floor.  She was then brought in by EMS.  In the hospital she experienced bradycardia and spontaneous increases in her heart rate up to the 150s.  PAST MEDICAL HISTORY:  Positive for loss of consciousness 1 year ago with outpatient workup and without any workup resulting in known syncope or positive  mitral valve prolapse.  Positive rheumatic fever as a child.  Positive childbirth x 2.  ALLERGIES:  No known drug allergies.  MEDICATIONS:  None.  REVIEW OF SYSTEMS:  Weight has increased.  Negative neuro.  Diffuse _________.  Pulmonary: Negative.  GI: Negative.  GU: Negative.  Cor: Negative.  FAMILY HISTORY:  Positive hypertension in mother and sister.  Diabetes mellitus in sister.  OHD by mother.  Mother deceased at age 37 of a myocardial infarction.  Father deceased younger of pneumonia.  SOCIAL HISTORY:  She is married and has two children.  PHYSICAL EXAMINATION:  VITAL SIGNS:  Temperature 97.8, respirations 16, blood pressure 104/69.  HEENT:  The head is normocephalic, atraumatic.  Eyes:  EOMI, PERRLA. Integument positive ___________.  Ears negative.  Nose negative.  NECK:  Supple.  No  thyromegaly.  CHEST:  Clear to auscultation bilaterally.  HEART:  Irregular, rapid flutters.  ABDOMEN:  Soft.  Positive bowel sounds.  Tenderness in lower abdomen.  No palpable ___________.  EXTREMITIES:  Positive pulses felt all extremities.  NEUROLOGIC:  Alert and oriented x 3.  LABORATORY:  Glucose 131, BUN 8, creatinine 0.6, sodium 142, potassium 3.4, chloride 103, CO2 30. Hemoglobin 48.  The pH was 7.45, pCO2 32, pO2 91.  EKG: Sinus tachycardia and abnormal T wave abnormality.  ASSESSMENT: 1. This 55 year old female with reported syncopal episode. 2. Atrial fibrillation and flutter. 3. ___________ 4. Hirsutism. 5. Abdominal pain.  PLAN:  Admit.  Get cardiac consult.  Will order abdominal CAT scan.  Will start  appropriate medications and IVs.  She is admitted to CCU. DD:  08/02/99 TD:  08/02/99 Job: [id number]

## 2010-12-02 NOTE — Discharge Summary (Signed)
[place]. [facility]  Patient:    Krystal Little, Krystal Little Visit Number: [id number] MRN: [id number]          Service Type: MED Location: [phone] Attending Physician:  Laverda Page Dictated by:   Evern Core Johny Blamer, R.N., N.P. Admit Date:  11/28/2001 Discharge Date: 12/03/2001                             Discharge Summary  HISTORY OF PRESENT ILLNESS: The patient is a 55 year old African-American female patient of Dr. Terance Ice.  She has a prior medical history of rheumatic heart disease as a child, mitral valve repair with a ring in 2000. She also had a pacemaker placed secondary to sick sinus syndrome.  She came to the hospital after being seen at Urgent Care secondary to abdominal bloating, epigastric fullness, decreased appetite for approximately one week. She was found to be in atrial flutter, rapid rate, at 150 beats per minute.  In the ER a Valsalva and carotid sinus pressure did not decrease the rhythm, so she was given some Adenosine and it revealed that she was in atrial flutter.  Thus, she was admitted for further cardiac evaluation.  She does have a prior history of paroxysmal atrial flutter previously on Sotalol.  The Sotalol was discontinued as an outpatient in January and she was placed on beta blockers.   HOSPITAL COURSE: On admission it was decided that she should be started on IV heparin, a TE to be done to decide if she should safely be put on Sotalol to rule out thrombus secondary to unknown duration of her PA flutter.  TE was performed on Nov 29, 2001 by Dr. Mathis Bud which showed no stroke.  It did show a suggestion of an LA thrombus which was small. It showed normal RV size and normal LV function, mitral valve area was 3.1 cm square.  She did have moderate TR and mild MR.  No paravalvular leak.  She had been started on Coumadin.  On Nov 30, 2001 she was restarted on Sotalol.  She converted to sinus rhythm on Dec 01, 2001.  On  Dec 02, 2001 her INR was 2.0 and it was decided to give her one day of overlap. On Dec 03, 2001 she maintained sinus rhythm.  She was seen by Dr. Quay Burow and it was decided that she was stable to be discharged home.  On the day of discharge her blood pressure was 118/54, heart rate was 68, respirations were 20, temperature 97.4 and she was in sinus rhythm 64 to 78.  Her CBC showed a hemoglobin of 11.4, hematocrit 33.3, WBC 7.0, platelets 175,000.  INR 2.4, PTT 11.5.  Lipids were drawn and showed a total cholesterol 137, triglycerides 177, HDL 41 and LDL 61. TSH was 2.58.  B-MET showed that she had a fasting glucose of 125 and 112.  BUN 15, creatinine 0.9, albumin 3.4, total protein 5.9, sodium 137, potassium 4.0. CK, MB and troponin were negative times three.  AST 50, ALT 64.  Coumadin dosage on Nov 28, 2001 was 7.5 mg; Nov 29, 2001 it was 7.5 mg; Nov 30, 2001 it was 1 mg; Dec 01, 2001 it was 5 mg; Dec 02, 2001 it was 5 mg.  INR on Nov 29, 2001 was 1.2, May 17 it was 1.8, May 18 it was 1.7 and May 19 it was 2.0 and on the day of discharge INR was  2.4.  DISCHARGE MEDICATIONS: 1. Digoxin 0.125 mg once per day. 2. Sotalol 120 mg twice per day. 3. Diltiazem 240 mg once per day. 4. Coumadin 5 mg once per day. 5. Nexium 40 mg once per day.  ACTIVITIES:  As tolerated.  DIET:  Low saturated fat diet. She should lose weight and exercise, decrease her carbohydrates, increase fruits and vegetables and fish and use  olive oil.  SPECIAL INSTRUCTIONS:  Her protime should be checked on Thursday with Richarda Blade at Dr. Young Berry office on May 22 at 9 a.m.  FOLLOW-UP: She should follow-up with Dr. Rollene Fare on June 9 at 2 p.m.  DISCHARGE DIAGNOSES: 1. Paroxysmal atrial flutter converted to sinus rhythm on Sotalol. 2. 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 3. History of rheumatic heart disease with mitral valve repair and ring in    the year 2000. 4.  Anticoagulation with Coumadin. 5. Dyslipidemia with high triglycerides and low HDL. 6. History of PTVDP for sick sinus syndrome. 7. Metabolic syndrome with criteria for fasting glucose greater than 110 and    triglycerides greater than 150 and an HDL less than 50. 8. Reflux symptoms. Dictated by:   Evern Core Johny Blamer, R.N., N.P. Attending Physician:  Laverda Page DD:  12/03/01 TD:  12/04/01 Job: [id number]

## 2010-12-02 NOTE — Procedures (Signed)
[place]. [facility]  Patient:    ANISTEN, GULYAS Visit Number: [id number] MRN: [id number]          Service Type: MED Location: [phone] Attending Physician:  Laverda Page Dictated by:   A. Odette Fraction, M.D. Proc. Date: 11/29/01 Admit Date:  11/28/2001   CC:         Richard A. Rollene Fare, M.D.  Shanon Rosser, M.D.   Procedure Report  PROCEDURES PERFORMED: Transesophageal echocardiography.  INDICATIONS: The patient is a 55 year old African American female who underwent mitral valve repair in 2000, who had previously been on sotalol. Sotalol was discontinued this year. She had some symptoms of abdominal discomfort and bloating over the past week or so. On presentation, she was found to be in atrial flutter with variable block and rapid ventricular response. She is, therefore, referred for TEE today to evaluate for possibility of thrombus. This is in anticipation of a possible elective cardioversion.  DESCRIPTION OF PROCEDURE: She was well sedated with Versed 3 mg IV and fentanyl 15 mcg IV. She was intubated without difficulty. Images were obtained and the study was completed. She was extubated and awakened without difficulty. There were no immediate complications.  IMPRESSION:  1. There is a suggestion of a small left atrial appendage thrombus. Size     is estimated at approximately 4 mm x 4 mm.  2. No "smoke" was detected in either the left atrium of the left atrial     appendage.  3. Low exit velocity within the left atrial appendage (approximately     20 cm/sec.).  4. Moderate left atrial enlargement.  5. Mitral valve repair is without rocking; however, mild mitral regurgitation     is noted. No perivalvular leak is appreciated.  6. Trace aortic insufficiency.  7. Pliable trileaflet aortic valve.  8. Normal left ventricular size.  9. Normal left ventricular systolic function without regional wall motion     abnormalities  noted. 10. Moderate tricuspid regurgitation. 11. Mitral valve area by pressure ______ approximately 3.1 cm sq. 12. Peak velocity across the mitral valve 1.36 m/sec. 13. Right atrial enlargement. 14. Bubble study negative for patent foramen ovale/atrial septal defect. 15. No elective cardioversion secondary to #1.  PLAN: Continue heparin/Coumadin/rate control. Discharge home when INR 2 to 3. Plan for sotalol after Coumadin therapeutic for three to four weeks with subsequent cardioversion once sotalol is loaded. Dictated by:   A. Odette Fraction, M.D. Attending Physician:  Laverda Page DD:  11/29/01 TD:  12/02/01 Job: [id number]

## 2010-12-02 NOTE — Discharge Summary (Signed)
[place]  Patient:    Krystal Little                        MRN: [id number] Adm. Date:  [id number] Disc. Date: [id number] Attending:  Darylene Price Dictator:   Ricki Miller, P.A.-C. CC:         Rushie Nyhan, M.D.             Richard A. Rollene Fare, M.D.                           Discharge Summary  HISTORY OF PRESENT ILLNESS: This is a 55 year old African-American female with  history of rheumatic fever as a child and a subsequent long history of heart murmur.  She has been followed for the last several years by Dr. Terance Ice with moderate to severe mitral regurgitation.  In May of 1996 she underwent a transthoracic echocardiogram which demonstrated moderate to severe mitral regurgitation with some enlargement of the left atrium.  At that time she maintained a sinus rhythm and remained asymptomatic.  She has been followed medically since that time.  She presented to the hospital on this admission with new-onset atrial fibrillation and a syncopal episode associated with profound bradycardia.  The patient reports she has actually had several episodes of syncope in the past and, in fact, approximately one year ago she had a formal neurological workup due to the episodes of syncope. She had been doing well until the day of admission when she developed her most recent and profound syncopal episode.  She initially developed symptoms of palpitations with rapid heart rate and subsequently passed out prompting hospital admission.  Electrocardiogram performed in the emergency room demonstrated atrial fibrillation with rapid ventricular response followed by episodes of documented  symptomatic bradycardia with 6-7 second pauses of asystole.  The patient was felt to require prompt admission for further evaluation and treatment.  PAST MEDICAL HISTORY: Past medical history is remarkable for mitral regurgitation as described, also  rheumatic fever as described, childbirth x 2.  ALLERGIES: None.  REVIEW OF SYSTEMS: Review of symptoms is unremarkable.  For the family history, social history and physical examination please see the history and physical examination done at the time of admission.  ADMISSION MEDICATIONS: None.  HOSPITAL COURSE: The patient was admitted and felt to require further evaluation. A transesophageal echocardiogram was ordered and this demonstrated moderate-to-severe mitral regurgitation with severe left atrial enlargement and  normal left ventricular function.  There was also mild tricuspid regurgitation.  The aortic valve appeared normal.  There was mild mitral annular calcification nd only slight thickening of the anterior leaflet of the mitral valve.  There was no significant evidence of mitral valve prolapse.  The posterior leaflet of the mitral valve appeared somewhat restricted in motion and there was failure of normal coaptation of the anterior and posterior leaflets with subsequent regurgitant jet.  There did not appear to be any flow reversal in the pulmonary  veins.  There was no left atrial thrombus although the left atrium was quite dilated.  A cardiac catheterization was then undertaken which revealed a normal coronary anatomy and after full discussion with the patient a surgical consultation was obtained with Dr. Darylene Price who evaluated the patient and her studies and agreed with the recommendations for proceeding with mitral valve repair and possible mitral valve replacement.  The patient was noted to have a urinary tract  infection preoperatively which was treated.  On August 09, 1999, the patient underwent a mitral valve repair with a quadrangular resection and sliding leaflet annuloplasty of the posterior leaflet and a chordal transposition to a flail segment of the anterior leaflet.  Also used was a #32 sequin ring annuloplasty.  The surgeon was Dr. [name].  The patient tolerated the well.  No complications were noted.  No bleeding products were given.  Findings included a degenerative etiology with prolapse of the anterior and posterior leaflets and small flail edge of the middle portion of the anterior leaflet.  There was no evidence of rheumatic etiology noted per Dr. [name].  The patient was transferred to the surgical intensive care unit in a normal sinus rhythm on no ionotropic agents.  Postoperatively, the hospital course has been remarkable primarily for difficulties with atrial fibrillation of variable rates, both rapid and bradycardic.  She was initially started on an amiodarone drip and subsequently did convert to a normal sinus rhythm.  She did have recurrence of the atrial fibrillation intermittently throughout the hospitalization.  It is felt to be currently controlled on her regimen of Betapace, Cardizem CD and Coumadin.  At the time of this dictation she has maintained a normal sinus rhythm for greater than 48 hours.  Of note, the patient did have placement of a permanent BDDR pacemaker on August 17, 1999, by Dr. Rollene Fare due to the bradycardic component of her presentation. She tolerated that well and is having no difficulties with pacer functioning well.  Currently the patient is quite stable hemodynamically.  The incision is healing  well without signs of infection.  She is tolerating routine cardiac rehabilitation, phase 1 modalities.  She is tolerating diet and other routine activities. Oxygen has been weaned and she maintains good saturations on room air.  LABORATORY DATA: Laboratory values are currently felt to be stable although she is noted to have had a postoperative anemia initially that did require transfusion for a hematocrit of 22% on August 13, 1999.  The most recent laboratory values show an INR of 2.1 on August 21, 1999.  Hemoglobin and hematocrit dated August 17, 1999, were 27.5.  The  most recent electrolytes dated August 19, 1999, are all within  normal limits with the exception of a slightly elevated glucose of 119.   Currently the patient is felt to be stable for tentative discharge in the morning of August 22, 1999.  DISCHARGE DIAGNOSES: 1. Severe mitral regurgitation with severe left atrial enlargement as described    above. 2. Remote history of rheumatic fever as a child. 3. Presenting symptoms of syncope secondary to paroxysmal atrial fibrillation and    tachy-brady syndrome. 4. Preoperative urinary tract infection. 5. Postoperative anemia. 6. Postoperative atrial fibrillation. 7. Postoperative permanent pacemaker placement.  DISCHARGE MEDICATIONS:  1. Coumadin 2.5 mg q.d. and as directed.  2. Betapace 120 mg q. 12 h.  3. Niferex 150 mg b.i.d.  4. Cardizem CD 240 mg q.d.  5. Keflex 500 mg q.i.d.  6. Folvite 1 q.d.  7. Lasix 40 mg q.d.  8. Pepcid 20 mg b.i.d.  9. Tylox 1-2 q. 4-6h. p.r.n. 10. K-Dur 20 mEq q.d.  FOLLOW UP:  She will follow up in three weeks with Dr. [name], 2 weeks with Dr. Rollene Fare.  Dr. Young Berry office will manage her Coumadin.  INSTRUCTIONS:  The patient will receive written instructions regarding medications, activity, diet, wound care and follow up. DD:  08/21/99 TD:  08/21/99 Job: [id number]

## 2010-12-02 NOTE — Discharge Summary (Signed)
[name], Krystal Little                        ACCOUNT NO.:  [id number]   MEDICAL RECORD NO.:  [id number]                   PATIENT TYPE:  INP   LOCATION:  3309                                 FACILITY:  [facility]   PHYSICIAN:  Eden Lathe. Einar Gip, M.D.                  DATE OF BIRTH:  1955-11-26   DATE OF ADMISSION:  02/01/2004  DATE OF DISCHARGE:  02/04/2004                                 DISCHARGE SUMMARY   DISCHARGE DIAGNOSES:  1. Recurrent atrial fibrillation this admission, sinus rhythm at discharge.  2. History of mitral regurgitation with mitral valve repair January of 2005.  3. Sick sinus syndrome, status post Medtronic Kappa DDDR pacemaker in 2001.  4. Good left ventricular function.   HOSPITAL COURSE:  The patient is a 55 year old female followed by Dr.  Rollene Fare.  She has a history of mitral regurgitation and had a mitral valve  ring and annuloplasty in January, 2005.  She has had PAF.  She had been on  Sotalol 120 mg b.i.d., she admits that she missed a couple days.  She  presented February 01, 2004, with atrial fibrillation.  She was admitted to  telemetry.  She had been on Coumadin in the past but this had been stopped,  I believe in February, and she was on aspirin and Plavix.  She was started  on IV heparin.  He felt it would be best if we resumed her Coumadin.  Her  Betapace was resumed.  Lanoxin was added.  She did convert to sinus rhythm  spontaneously and we feel she can be discharged February 04, 2004.   DISCHARGE MEDICATIONS:  1. Lanoxin 0.25 mg daily.  2. Sotalol 120 mg twice a day.  3. Coumadin 7.5 mg Monday, Wednesday and Friday and 5 mg other days.  4. Diltiazem SR 120 mg a day.   LABORATORIES:  INR 2.3, white count 7.4, hemoglobin 9.7, hematocrit 29.6,  platelets 299, sodium 139, potassium 3.7, BUN 14, creatinine 1.3.  Magnesium  2.0.  CK MB and troponins are negative x3.  TSH 1.50.   At discharge she is in sinus rhythm with a QTC of 438.   DISPOSITION:  Patient is  discharged in stable condition and will followup  with Dr. Rollene Fare March 04, 2004, at 11 a.m.  Her recurrent atrial  fibrillation was probably secondary to noncompliance.      Erlene Quan, P.A.                      Eden Lathe. Einar Gip, M.D.    [name]  D:  02/04/2004  T:  02/05/2004  Job:  [id number]

## 2010-12-02 NOTE — H&P (Signed)
NAME:  Krystal Little, Krystal Little                        ACCOUNT NO.:  [id number]   MEDICAL RECORD NO.:  [id number]                   PATIENT TYPE:  EMS   LOCATION:  URG                                  FACILITY:  [facility]   PHYSICIAN:  Marolyn Hammock, MD                    DATE OF BIRTH:  1956-05-05   DATE OF ADMISSION:  02/01/2004  DATE OF DISCHARGE:                                HISTORY & PHYSICAL   CHIEF COMPLAINT:  Lightheadedness.   HISTORY OF PRESENT ILLNESS:  This 55 year old African-American female with a  history of mitral valve regurgitation, status post replacement of valve and  pacemaker insertion in 2001, by Dr. Valentina Gu. Krystal Little, presents with  tachycardia and lightheadedness x2 days.  The patient was seen in our  primary care office today and was noted to have an elevated heart rate after  complaining of lightheadedness.  Knowing the patient's past medical history,  she was referred to come over to the cardiologist's office where she was  seen today.  An electrocardiogram was done and had atrial flutter with 2:1  conduction.  The patient reports that she had missed two days of her  Sotalol.  She just happened to miss it.  She ran out, and had not gotten the  prescription filled.  The patient also had complained of nausea, but no  recent illness.  She denied any edema, diaphoresis, or chest pain or  headache.   REVIEW OF SYSTEMS:  Otherwise negative.   ALLERGIES:  No known drug allergies.   MEDICATIONS:  1. Sotalol 120 mg p.o. b.i.d.  2. Diltiazem ER 120 mg daily.  3. Enteric-coated aspirin daily.  4. Plavix 75 mg daily.   PAST MEDICAL HISTORY:  1. Mitral valve regurgitation, secondary to rheumatic fever, status post     valve replacement and pacemaker insertion in 2001, Dr. Roxy Little.  2. An echocardiogram on December 16, 2003, showed an ejection fraction of 55%,     thickened AMVL and a fixed PMVL, mild mitral valve regurgitation.  No     significant changes from January 01, 2003.  3.  A cardiac catheterization in 2001.  4. A pacemaker in 2001.  The patient denies diabetes, hypertension, pulmonary disease, or other past  medical or past surgical history.   FAMILY HISTORY:  Unremarkable.   SOCIAL HISTORY:  The patient has four children and is married.  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  No smoking, alcohol, or drug use.   PHYSICAL EXAMINATION:  VITAL SIGNS:  Blood pressure 120/84, heart rate 131,  weight 157.5 pounds, height 5 feet 3 inches.  GENERAL:  Alert, in no apparent distress.  Well-nourished, well-developed  African-American female.  HEENT:  Pupils equal, round, reactive to light.  NECK:  No adenopathy. No thyromegaly.  CARDIOVASCULAR:  Tachycardia.  No murmurs, rubs, or gallops.  With 2+  peripheral pulses.  No bruits.  No peripheral edema.  LUNGS:  Clear to auscultation bilaterally.  ABDOMEN:  Positive bowel sounds, soft, nontender, nondistended.  NEUROLOGIC:  Cranial nerves grossly intact.  No focal deficits.  A 12-lead electrocardiogram revealed atrial flutter with a 2:1 conduction  and nonspecific ST-T wave changes.   ASSESSMENT:  38. A 55 year old African-American female with atrial flutter and 2:1     conduction.  2. Mitral valve regurgitation, status post mitral valve replacement in 2001,     by Dr. Roxy Little.  3. Paroxysmal atrial fibrillation.   RECOMMENDATIONS:  1. Admit the patient.  2. Start Sotalol 120 mg b.i.d.  3. Lanoxin 0.125 mcg daily.  4. Start a Cardizem drip.  5. Start a heparin drip.  6. Coumadin for anticoagulation.  7. Get basic labs and TSH, BMET q.8h. x3, cardiac enzymes and CBC.  8. Observe the patient.                                                Marolyn Hammock, MD    AD/MEDQ  D:  02/01/2004  T:  02/01/2004  Job:  [id number]

## 2010-12-02 NOTE — Op Note (Signed)
[place] [place]  Patient:    Krystal Little, Krystal Little                       MRN: [id number] Proc. Date: 08/17/99 Attending:  Delfino Lovett A. Rollene Fare, M.D. CC:         Valentina Gu. [name], M.D. - Alla German, M.D.             [facility]             Elyn Peers, M.D.             Rushie Nyhan, M.D.                           Operative Report  PROCEDURE:  Implantation of permanent DDDR rate-responsive, mode-switching A-V Universal pulse generator with passive fixation, atrial and ventricular leads.  IMPLANTING PHYSICIAN:  Richard A. Rollene Fare, M.D.  COMPLICATIONS:  None.  ESTIMATED BLOOD LOSS:  Approximately 40 cc.  ANESTHESIA:  Valium 5 mg p.o. premedication, 1% local Xylocaine, 2 mg of Nubain for sedation during the procedure.  PREOPERATIVE DIAGNOSIS: 1. Sick sinus syndrome - paroxysmal atrial fibrillation. 2. Recurrent syncope with documented severe bradycardia and asystoles,    greater than 8-12 seconds. 3. Severe mitral regurgitation, mitral valve repair, with Dr. Valentina Gu. [name],    with chordal transposition and Segvin #32 ring annuloplasty on August 09, 1999. 4. Postoperative anemia. 5. Normal coronary arteries. 6. Remote history of rheumatic heart disease at age 17.  No known rheumatic    valvular disease or heart disease. 7. Well-preserved left ventricular function, with an ejection fraction of    approximately 50% preoperatively.  POSTOPERATIVE DIAGNOSIS: 1. Sick sinus syndrome - paroxysmal atrial fibrillation. 2. Recurrent syncope with documented severe bradycardia and systoles,    greater than 8-12 seconds. 3. Severe mitral regurgitation, mitral valve repair, with Dr. [name],    with chordal transposition and Segvin #32 ring annuloplasty on August 09, 1999. 4. Postoperative anemia. 5. Normal coronary arteries. 6. Remote history of rheumatic heart disease at age 65.  No known rheumatic    valvular disease or heart  disease. 7. Well-preserved left ventricular function, with an ejection fraction of    approximately 50% preoperatively.  PULSE GENERATOR:  Medtronic Kappa [id number], [id number] H.  VENTRICULAR ELECTRODE:  Medtronic [id number], [id number] V.  ATRIAL ELECTRODE:  Medtronic [id number], [id number] V.  THRESHOLDS: Unipolar ventricle:  R = 14.9 MV. Minimal threshold:  0.3 V. Resistance:  432 ohms.  Unipolar atrium:  P.O. = 1.3 MV. Resistance:  442 ohms. Minimal threshold:  0.3 V.  Bipolar ventricle:  R = 22.4 MV. Resistance 622 ohms. Minimal threshold:  0.5 V.  Bipolar atrial:  P.O. = 1.4 MV. Resistance:  520 ohms. Minimal threshold:  0.4 V.  The generator conformed to the manufacturers specification on PSA testing with .7 V atrium, and 4.2 V ventricular output, at 0.4 msec.  There was retrograde V-A Wenckebach at ventricular pacing rates of 100 per minute through the implanted electrodes on testing.  DESCRIPTION OF PROCEDURE:  The patient was brought to the second floor CP laboratory in the postabsorptive state.  She had previous localized inflammation of her right upper extremity IV site that had been treated with local compresses and IV vancomycin.  Cultures were negative.  The patient was afebrile, with a normal white count.  She was brought to the second floor C laboratory in  the postabsorptive state.  The right anterior chest was prepped and draped in the usual manner.  Xylocaine 1% was used for local anesthesia.  She was given 2 mg of Nubain during the procedure for sedation.  A right infraclavicular curvilinear transverse incision was performed and brought down to the prepectoral fascia using electrocautery to control hemostasis and blunt dissection.  Heparin had been on  hold, greater than six hours prior to the procedure.  A pulse generator pocket as formed using blunt dissection.  The subclavian vein was entered with a single anterior puncture using a #18  thin-walled needle, and using tandem #9 peel-away  Cook introducers, with a stainless steel J-tipped guide wire.  The electrodes were introduced in tandem.  The ventricular electrode was positioned in the RV apex, and the atrial electrode was positioned in the right atrial appendage, confirmed by  fluoroscopy and rotational maneuvers.  Both electrodes were stable.  Threshold testing unipolar and bipolar was done, along with intracardiac electrograms. There was a biphasic ventricular electrogram that was predominantly positive, with a ood current of injury and slew rate.  The atrial electrogram was good, despite relatively low amplitude of P-waves.  There were excellent thresholds.  The electrodes were secured at the insertion site with the previously-placed #1 figure-of-eight silk suture around a tissue sewing collar, to prevent migration, and to control hemostasis.  The electrodes were further secured with #2-0 interrupted #1 silk sutures around a silicone sewing collar for each electrode.  The generator conformed to the manufacturers specifications.  The pocket was irrigated with 500 mg of kanamycin solution.  The generator was hooked to the electrodes, and each in proper sequence.  Each single hex nut was tightened for  each electrode, and the generator delivered into the pocket with the electrodes  looped behind.  The generator was loosely secured to the underlying muscle and fascia with #1 silk suture to prevent migration.  The subcutaneous tissue was closed with two separate running layers of #2-0 Dexon suture, and the skin was closed with #5-0 subcuticular Dexon suture.  Steri-Strips and a bulky dressing ere applied.  The fluoroscopy showed good position of the atrial and the ventricular electrodes.  The patient was taken to the postoperative holding area for postoperative programming, in stable condition.  She was in sinus rhythm during the procedure. She had recently  been in atrial fibrillation, converted on IV Cardizem and beta   blocker.  Magnet rate at BOL:  = 85. Magnet rate at RRT:  = 65.  The pacer reverts to VVI mode. DD:  08/17/99 TD:  08/17/99 Job: [id number]

## 2010-12-07 ENCOUNTER — Ambulatory Visit (INDEPENDENT_AMBULATORY_CARE_PROVIDER_SITE_OTHER): Payer: 59 | Admitting: *Deleted

## 2010-12-07 DIAGNOSIS — Z7901 Long term (current) use of anticoagulants: Secondary | ICD-10-CM

## 2010-12-07 DIAGNOSIS — I4891 Unspecified atrial fibrillation: Secondary | ICD-10-CM

## 2010-12-07 LAB — POCT INR: INR: 2.5

## 2011-01-11 ENCOUNTER — Ambulatory Visit: Payer: 59

## 2011-02-14 ENCOUNTER — Ambulatory Visit (INDEPENDENT_AMBULATORY_CARE_PROVIDER_SITE_OTHER): Payer: 59 | Admitting: *Deleted

## 2011-02-14 DIAGNOSIS — Z7901 Long term (current) use of anticoagulants: Secondary | ICD-10-CM

## 2011-02-14 DIAGNOSIS — I4891 Unspecified atrial fibrillation: Secondary | ICD-10-CM

## 2011-04-12 ENCOUNTER — Other Ambulatory Visit: Payer: Self-pay | Admitting: Family Medicine

## 2011-04-12 DIAGNOSIS — Z1231 Encounter for screening mammogram for malignant neoplasm of breast: Secondary | ICD-10-CM

## 2011-04-13 ENCOUNTER — Ambulatory Visit (HOSPITAL_COMMUNITY)
Admission: RE | Admit: 2011-04-13 | Discharge: 2011-04-13 | Disposition: A | Discharge status: Home / Self Care | Payer: 59 | Source: Ambulatory Visit | Attending: Family Medicine | Admitting: Family Medicine

## 2011-04-13 ENCOUNTER — Ambulatory Visit (INDEPENDENT_AMBULATORY_CARE_PROVIDER_SITE_OTHER): Payer: 59 | Admitting: *Deleted

## 2011-04-13 ENCOUNTER — Ambulatory Visit (INDEPENDENT_AMBULATORY_CARE_PROVIDER_SITE_OTHER): Payer: 59

## 2011-04-13 DIAGNOSIS — Z7901 Long term (current) use of anticoagulants: Secondary | ICD-10-CM

## 2011-04-13 DIAGNOSIS — I4891 Unspecified atrial fibrillation: Secondary | ICD-10-CM

## 2011-04-13 DIAGNOSIS — Z23 Encounter for immunization: Secondary | ICD-10-CM

## 2011-04-13 DIAGNOSIS — Z1231 Encounter for screening mammogram for malignant neoplasm of breast: Secondary | ICD-10-CM | POA: Insufficient documentation

## 2011-04-26 ENCOUNTER — Encounter: Payer: 59 | Admitting: Family Medicine

## 2011-05-19 ENCOUNTER — Encounter: Payer: 59 | Admitting: Family Medicine

## 2011-06-16 ENCOUNTER — Other Ambulatory Visit (HOSPITAL_COMMUNITY)
Admission: RE | Admit: 2011-06-16 | Discharge: 2011-06-16 | Disposition: A | Discharge status: Home / Self Care | Payer: 59 | Source: Ambulatory Visit | Attending: Family Medicine | Admitting: Family Medicine

## 2011-06-16 ENCOUNTER — Encounter: Payer: Self-pay | Admitting: Family Medicine

## 2011-06-16 ENCOUNTER — Ambulatory Visit (INDEPENDENT_AMBULATORY_CARE_PROVIDER_SITE_OTHER): Payer: 59 | Admitting: *Deleted

## 2011-06-16 ENCOUNTER — Ambulatory Visit (INDEPENDENT_AMBULATORY_CARE_PROVIDER_SITE_OTHER): Payer: 59 | Admitting: Family Medicine

## 2011-06-16 VITALS — BP 124/74 | HR 98 | Temp 98.0°F | Ht 63.75 in | Wt 164.0 lb

## 2011-06-16 DIAGNOSIS — I4891 Unspecified atrial fibrillation: Secondary | ICD-10-CM

## 2011-06-16 DIAGNOSIS — I080 Rheumatic disorders of both mitral and aortic valves: Secondary | ICD-10-CM

## 2011-06-16 DIAGNOSIS — E119 Type 2 diabetes mellitus without complications: Secondary | ICD-10-CM

## 2011-06-16 DIAGNOSIS — Z124 Encounter for screening for malignant neoplasm of cervix: Secondary | ICD-10-CM

## 2011-06-16 DIAGNOSIS — Z7901 Long term (current) use of anticoagulants: Secondary | ICD-10-CM

## 2011-06-16 DIAGNOSIS — Z01419 Encounter for gynecological examination (general) (routine) without abnormal findings: Secondary | ICD-10-CM | POA: Insufficient documentation

## 2011-06-16 LAB — LIPID PANEL
Cholesterol: 192 mg/dL (ref 0–200)
HDL: 42 mg/dL (ref >39)
Total CHOL/HDL Ratio: 4.6 Ratio
Triglycerides: 118 mg/dL (ref <150)
VLDL: 24 mg/dL (ref 0–40)

## 2011-06-16 LAB — POCT GLYCOSYLATED HEMOGLOBIN (HGB A1C): Hemoglobin A1C: 8.8

## 2011-06-16 LAB — COMPREHENSIVE METABOLIC PANEL
ALT: 15 U/L (ref 0–35)
BUN: 14 mg/dL (ref 6–23)
CO2: 28 mEq/L (ref 19–32)
Creat: 0.73 mg/dL (ref 0.50–1.10)
Total Bilirubin: 1.2 mg/dL (ref 0.3–1.2)

## 2011-06-16 MED ORDER — SPIRONOLACTONE 25 MG PO TABS: 25.0000 mg | ORAL_TABLET | Freq: Every day | ORAL | Status: DC

## 2011-06-16 NOTE — Progress Notes (Signed)
  Subjective:    Patient ID: Krystal Little, female    DOB: Jul 02, 1956, 55 y.o.   MRN: [id number]  HPI 55 yo F present for comprehensive physical. She has no complaints. She takes her metformin irregularly due to GI side effects. She used to walk for exercise but has stopped doing this regularly. She is compliant with her coumadin. She is compliant with her diltiazem and atenolol. She has stopped taking spironolactone when the LE edema resolved. She denies recurrent LE edema, palpitations, CP, SOB.  Review of Systems As per HPI    Objective:   Physical Exam Gen: awake alert NAD HEENT: MMM, no JVD, no thyromegaly.  CV: SISI, sinus rhythm, 3-4 SEJM radiating to L axilla RESP: CTA b/l nml WOB ABD: slightly obese, NT/ND GU: no external lesions. Speculum exam reveal white vaginal D/C. Normal vaginal mucosa. Normal cervix. Pap smear performed.  EXT: no edema.     Assessment & Plan:

## 2011-06-16 NOTE — Patient Instructions (Signed)
Mrs. Moradi  thank you for coming in today,  I will call you with the results of your blood work and Pap smear. Please get back to walking increased energy and maintain a healthy weight.  Please plan to follow with me in one year or sooner if needed.   Have a happy Holiday! -Dr. Adrian Blackwater

## 2011-06-20 NOTE — Assessment & Plan Note (Signed)
A: stable. P: restart spirinolactone.

## 2011-06-20 NOTE — Assessment & Plan Note (Addendum)
A: well controlled. Sinus rhythm. Med: compliant. No side effects.  P: continue current mgmt: coumadin, dilt, metoprolol.

## 2011-06-20 NOTE — Assessment & Plan Note (Signed)
A: declined, A1c elevated.  Med: inconsistently compliant with metformin.  P: restart metformin. Exercise daily. Increased metformin as tolerated. Add glipizide if tighter glycemin control needed.

## 2011-10-10 ENCOUNTER — Telehealth: Payer: Self-pay | Admitting: Family Medicine

## 2011-10-10 ENCOUNTER — Encounter: Payer: Self-pay | Admitting: *Deleted

## 2011-10-10 DIAGNOSIS — I4891 Unspecified atrial fibrillation: Secondary | ICD-10-CM

## 2011-10-10 DIAGNOSIS — Z7901 Long term (current) use of anticoagulants: Secondary | ICD-10-CM

## 2011-10-10 MED ORDER — WARFARIN SODIUM 5 MG PO TABS: 5.0000 mg | ORAL_TABLET | ORAL | Status: DC

## 2011-10-10 NOTE — Telephone Encounter (Signed)
Will forward to  Dr. McDiarmid preceptor to ask about refilling this now and when shall we schedule recheck on coumadin. Patient states she was suppose to come every month but she just  hasn't.

## 2011-10-10 NOTE — Telephone Encounter (Signed)
Pt called to ask for refill on her coumadin.  She has been out for 4 days and hasn't had her INR checked since 06/16/11.  I told patient that I would have a nurse call her back

## 2011-10-10 NOTE — Telephone Encounter (Signed)
Dr. McDiarmid advises may send in refill for Coumadin to last until she can be scheduled for appointment with provider and recheck INR. Appointment scheduled for Monday April 1.

## 2011-10-10 NOTE — Telephone Encounter (Signed)
Message left on voicemail for patient to call back.

## 2011-10-10 NOTE — Telephone Encounter (Signed)
This encounter was created in error - please disregard.

## 2011-10-11 NOTE — Telephone Encounter (Signed)
Patient notified and she will try to keep appointment for Monday as scheduled .

## 2011-10-16 ENCOUNTER — Ambulatory Visit (INDEPENDENT_AMBULATORY_CARE_PROVIDER_SITE_OTHER): Payer: PRIVATE HEALTH INSURANCE | Admitting: *Deleted

## 2011-10-16 ENCOUNTER — Encounter: Payer: Self-pay | Admitting: Family Medicine

## 2011-10-16 ENCOUNTER — Ambulatory Visit (INDEPENDENT_AMBULATORY_CARE_PROVIDER_SITE_OTHER): Payer: PRIVATE HEALTH INSURANCE | Admitting: Family Medicine

## 2011-10-16 VITALS — BP 160/92 | HR 77 | Temp 98.9°F | Ht 63.75 in | Wt 162.4 lb

## 2011-10-16 DIAGNOSIS — Z7901 Long term (current) use of anticoagulants: Secondary | ICD-10-CM

## 2011-10-16 DIAGNOSIS — I4891 Unspecified atrial fibrillation: Secondary | ICD-10-CM

## 2011-10-16 DIAGNOSIS — I10 Essential (primary) hypertension: Secondary | ICD-10-CM

## 2011-10-16 DIAGNOSIS — I152 Hypertension secondary to endocrine disorders: Secondary | ICD-10-CM

## 2011-10-16 DIAGNOSIS — E1159 Type 2 diabetes mellitus with other circulatory complications: Secondary | ICD-10-CM | POA: Insufficient documentation

## 2011-10-16 DIAGNOSIS — E1169 Type 2 diabetes mellitus with other specified complication: Secondary | ICD-10-CM

## 2011-10-16 DIAGNOSIS — E119 Type 2 diabetes mellitus without complications: Secondary | ICD-10-CM

## 2011-10-16 MED ORDER — GLIMEPIRIDE 2 MG PO TABS: 2.0000 mg | ORAL_TABLET | Freq: Every day | ORAL | Status: DC

## 2011-10-16 MED ORDER — WARFARIN SODIUM 5 MG PO TABS: 5.0000 mg | ORAL_TABLET | ORAL | Status: DC

## 2011-10-16 NOTE — Patient Instructions (Addendum)
Very nice to meet you. I have refilled your Coumadin. Want to have insurance we should consider potentially changing you to a new medication other than Coumadin. You can discuss this with Dr. Adrian Blackwater. I also encouraged to followup with your heart Doctor. Asked him about changing your diltiazem to metoprolol or carvedilol I am giving you a new medication for your diabetes, take one pill daily of this medicine. Also take both pills of your metformin. Have good evening. Come back in one month.

## 2011-10-16 NOTE — Assessment & Plan Note (Signed)
Patient is a regular rhythm it is does have a pacemaker in. Patient will followup with her cardiologist in the near future. Coumadin was restarted continue the same regimen patient's INR has been very well controlled during this time. Patient's INR is at 2.0 today. Patient will need Coumadin to she does have valvular atrial fib.

## 2011-10-16 NOTE — Progress Notes (Signed)
  Subjective:    Patient ID: Krystal Little, female    DOB: Jun 19, 1956, 56 y.o.   MRN: [id number]  HPI 1. Hypertension Blood pressure at home: Not checking Blood pressure today: 0000000 systolic has not taken her diltiazem for 4 days. Taking Meds: No Side effects: No ROS: Denies headache visual changes nausea, vomiting, chest pain or abdominal pain or shortness of breath.   Diabetes:  High at home: Not checking Low at home: Not checking Taking medications: No takes her metformin sparingly Side effects: States she has diarrhea ROS: denies fever, chills, dizziness, loss of conscieness, polyuria poly dipsia numbness or tingling in extremities or chest pain. Lab Results  Component Value Date   HGBA1C 10.3 10/16/2011   Atrial fibrillation- patient has history of mitral regurg atrial fibrillation with a [place] score of 2. Patient has been on Coumadin long time has been without it for the last 4 months. Patient is also status post cardiac pacemaker. Patient would not be a candidate for xaerlto do to her mitral regurg. Patient has not followed up with her cardiologist for some time. Denies shortness of breath lower extremity swelling or chest pain. Patient also denies any pain in extremities. Lab Results  Component Value Date   INR 2.0 10/16/2011   INR 1.9 06/16/2011   INR 2.5 04/13/2011      Review of Systems As stated in history of present illness    Objective:   Physical Exam Filed Vitals:   10/16/11 1620  BP: 160/92  Pulse: 77  Temp: 98.9 F (37.2 C)  TempSrc: Oral  Height: 5' 3.75" (1.619 m)  Weight: 162 lb 6.4 oz (73.664 kg)    Gen: awake alert NAD HEENT: MMM, no JVD, no thyromegaly.  CV: SISI, irregular rhythm, 3-4 SEJM radiating to L axilla RESP: CTA b/l nml WOB ABD: slightly obese, NT/ND ExT: no edema.      Assessment & Plan:

## 2011-10-16 NOTE — Assessment & Plan Note (Signed)
Elevated again today patient states she did not take her diltiazem. Patient states that this medication is expensive. Would consider changing patient's atenolol and diltiazem to high-dose carvedilol, patient will discuss this with her cardiologist. Patient will come back in one month's time for followup.

## 2011-10-16 NOTE — Assessment & Plan Note (Signed)
Starting Amaryl today due to patient's glucose A1c being significantly elevated. Encourage her to take metformin a regular basis as well. Followup in one month Discussed increasing walking as well as food regimen.

## 2011-10-17 ENCOUNTER — Telehealth: Payer: Self-pay | Admitting: Family Medicine

## 2011-10-17 DIAGNOSIS — E119 Type 2 diabetes mellitus without complications: Secondary | ICD-10-CM

## 2011-10-17 MED ORDER — METFORMIN HCL ER 500 MG PO TB24: 1000.0000 mg | ORAL_TABLET | Freq: Every day | ORAL | Status: DC

## 2011-10-17 NOTE — Telephone Encounter (Signed)
Krystal Little calling back to say she does need refill on her Metformin.  Please send to [facility] on [address].

## 2011-12-22 ENCOUNTER — Encounter: Payer: Self-pay | Admitting: Family Medicine

## 2011-12-28 ENCOUNTER — Ambulatory Visit (INDEPENDENT_AMBULATORY_CARE_PROVIDER_SITE_OTHER): Payer: PRIVATE HEALTH INSURANCE | Admitting: *Deleted

## 2011-12-28 DIAGNOSIS — Z7901 Long term (current) use of anticoagulants: Secondary | ICD-10-CM

## 2011-12-28 DIAGNOSIS — I4891 Unspecified atrial fibrillation: Secondary | ICD-10-CM

## 2012-01-28 ENCOUNTER — Encounter: Payer: Self-pay | Admitting: Family Medicine

## 2012-02-28 ENCOUNTER — Encounter: Payer: Self-pay | Admitting: Family Medicine

## 2012-02-28 DIAGNOSIS — I080 Rheumatic disorders of both mitral and aortic valves: Secondary | ICD-10-CM

## 2012-05-27 ENCOUNTER — Encounter: Payer: Self-pay | Admitting: Home Health Services

## 2012-05-29 ENCOUNTER — Encounter: Payer: Self-pay | Admitting: Home Health Services

## 2012-10-02 ENCOUNTER — Ambulatory Visit (INDEPENDENT_AMBULATORY_CARE_PROVIDER_SITE_OTHER): Payer: BLUE CROSS/BLUE SHIELD | Admitting: *Deleted

## 2012-10-02 ENCOUNTER — Ambulatory Visit (INDEPENDENT_AMBULATORY_CARE_PROVIDER_SITE_OTHER): Payer: BLUE CROSS/BLUE SHIELD | Admitting: Family Medicine

## 2012-10-02 ENCOUNTER — Encounter: Payer: Self-pay | Admitting: *Deleted

## 2012-10-02 ENCOUNTER — Encounter: Payer: Self-pay | Admitting: Family Medicine

## 2012-10-02 VITALS — BP 133/76 | HR 86 | Temp 97.7°F | Ht 63.7 in | Wt 152.0 lb

## 2012-10-02 DIAGNOSIS — I4891 Unspecified atrial fibrillation: Secondary | ICD-10-CM

## 2012-10-02 DIAGNOSIS — Z7901 Long term (current) use of anticoagulants: Secondary | ICD-10-CM

## 2012-10-02 DIAGNOSIS — E1165 Type 2 diabetes mellitus with hyperglycemia: Secondary | ICD-10-CM

## 2012-10-02 DIAGNOSIS — IMO0001 Reserved for inherently not codable concepts without codable children: Secondary | ICD-10-CM

## 2012-10-02 LAB — POCT UA - GLUCOSE/PROTEIN
Glucose, UA: NEGATIVE
Protein, UA: 100

## 2012-10-02 LAB — POCT GLYCOSYLATED HEMOGLOBIN (HGB A1C): Hemoglobin A1C: 9.9

## 2012-10-02 LAB — LDL CHOLESTEROL, DIRECT: Direct LDL: 107 mg/dL — ABNORMAL HIGH

## 2012-10-02 LAB — POCT INR: INR: 1.8

## 2012-10-02 NOTE — Patient Instructions (Addendum)
Krystal Little,  Thank you for coming in today.  Awesome job with weight loss!   For diabetes: Continue metformin one tab twice daily  Start amaryl once daily with food.  I will contact you with lab results: cholesterol and urine study.   For A fib: Continue coumadin.  See info about xarelto below.   F/u with me in 3 months or sooner if you would like to switch to xarelto.   Dr. Adrian Blackwater  Rivaroxaban oral tablets What is this medicine? RIVAROXABAN (ri va ROX a ban) is an anticoagulant (blood thinner). It is used to treat blood clots in the lungs or in the veins. It is also used after knee or hip surgeries to prevent blood clots. It is also used to lower the chance of stroke in people with a medical condition called atrial fibrillation. This medicine may be used for other purposes; ask your health care provider or pharmacist if you have questions. What should I tell my health care provider before I take this medicine? They need to know if you have any of these conditions: -bleeding disorders -bleeding in the brain -blood in your stools (black or tarry stools) or if you have blood in your vomit -history of stomach bleeding -kidney disease -liver disease -low blood counts, like low white cell, platelet, or red cell counts -recent or planned spinal or epidural procedure -take medicines that treat or prevent blood clots -an unusual or allergic reaction to rivaroxaban, other medicines, foods, dyes, or preservatives -pregnant or trying to get pregnant -breast-feeding How should I use this medicine? Take this medicine by mouth with a glass of water. Follow the directions on the prescription label. Take your medicine at regular intervals. Do not take it more often than directed. Do not stop taking except on your doctor's advice. If you are taking this medicine after hip or knee replacement surgery, take it with or without food. If you are taking this medicine for atrial fibrillation, take  it with your evening meal. If you are taking this medicine to treat blood clots, take it with food at the same time each day. If you are unable to swallow your tablet, you may crush the tablet and mix it in applesauce. Then, immediately eat the applesauce. You should eat more food right after you eat the applesauce containing the crushed tablet. Talk to your pediatrician regarding the use of this medicine in children. Special care may be needed. Overdosage: If you think you've taken too much of this medicine contact a poison control center or emergency room at once. Overdosage: If you think you have taken too much of this medicine contact a poison control center or emergency room at once. NOTE: This medicine is only for you. Do not share this medicine with others. What if I miss a dose? If you take your medicine once a day and miss a dose, take the missed dose as soon as you remember. If you take your medicine twice a day and miss a dose, take the missed dose immediately. In this instance, 2 tablets may be taken at the same time. The next day you should take 1 tablet twice a day as directed. What may interact with this medicine? -aspirin and aspirin-like medicines -certain antibiotics like erythromycin, azithromycin, and clarithromycin -certain medicines for fungal infections like ketoconazole and itraconazole -certain medicines for irregular heart beat like amiodarone, quinidine, dronedarone -certain medicines for seizures like carbamazepine, phenytoin -certain medicines that treat or prevent blood clots like warfarin, enoxaparin, and  dalteparin  -conivaptan -diltiazem -felodipine -indinavir -lopinavir; ritonavir -NSAIDS, medicines for pain and inflammation, like ibuprofen or naproxen -ranolazine -rifampin -ritonavir -St. John's wort -verapamil This list may not describe all possible interactions. Give your health care provider a list of all the medicines, herbs, non-prescription drugs, or  dietary supplements you use. Also tell them if you smoke, drink alcohol, or use illegal drugs. Some items may interact with your medicine. What should I watch for while using this medicine? Do not stop taking this medicine without first talking to your doctor. Stopping this medicine may increase your risk of having a stroke. Be sure to refill your prescription before you run out of medicine. This medicine may increase your risk to bruise or bleed. Call your doctor or health care professional if you notice any unusual bleeding. Be careful brushing and flossing your teeth or using a toothpick because you may bleed more easily. If you have any dental work done, tell your dentist you are receiving this medicine. What side effects may I notice from receiving this medicine? Side effects that you should report to your doctor or health care professional as soon as possible: -allergic reactions like skin rash, itching or hives, swelling of the face, lips, or tongue -bloody or black, tarry stools -changes in vision -confusion, trouble speaking or understanding -red or dark-brown urine -redness, blistering, peeling or loosening of the skin, including inside the mouth -severe headaches -spitting up blood or brown material that looks like coffee grounds -sudden numbness or weakness of the face, arm or leg -trouble walking, dizziness, loss of balance or coordination -unusual bruising or bleeding from the eye, gums, or nose  Side effects that usually do not require medical attention (Report these to your doctor or health care professional if they continue or are bothersome.): -dizziness -muscle pain This list may not describe all possible side effects. Call your doctor for medical advice about side effects. You may report side effects to FDA at 1-800-FDA-1088. Where should I keep my medicine? Keep out of the reach of children. Store at room temperature between 15 and 30 degrees C (59 and 86 degrees F).  Throw away any unused medicine after the expiration date. NOTE: This sheet is a summary. It may not cover all possible information. If you have questions about this medicine, talk to your doctor, pharmacist, or health care provider.  2013, [employer]vier/Gold Standard. (09/20/2011 8:55:13 AM)

## 2012-10-02 NOTE — Progress Notes (Signed)
Subjective:     Patient ID: Krystal Little, female   DOB: [birth year], 57 y.o.   MRN: [id number]  HPI 57 yo F presents for f/u visit to discuss the following:  1. A fib: taking coumadin. Had greens yesterday. Denies bleeding and bruising. Taking atenolol and diltiazem denies frequent palpitations. Ask about xarelto. We discussed that switching is a possibility. Major side effect is GI upset.   2.  DM: taking metformin 500 mg BID. Not taking amaryl. Get diarrhea from metformin. Denies abdominal pain and nausea. Denies HA, CP, vision changes and tingling/numbness in extremities.   Review of Systems As per HPI     Objective:   Physical Exam BP 133/76  Pulse 86  Temp(Src) 97.7 F (36.5 C) (Oral)  Ht 5' 3.7" (1.618 m)  Wt 152 lb (68.947 kg)  BMI 26.34 kg/m2 General appearance: alert, cooperative and no distress Lungs: clear to auscultation bilaterally Heart: regular rate and rhythm, S1, S2 normal, no murmur, click, rub or gallop Extremities: extremities normal, atraumatic, no cyanosis or edema Diabetic foot exam done and normal.      Assessment and Plan:

## 2012-10-02 NOTE — Assessment & Plan Note (Signed)
A: rate controlled and in sinus rhythm. P:  Continue current regimen Gave info about xarelto

## 2012-10-02 NOTE — Assessment & Plan Note (Addendum)
A: A1c above goal. P: start amaryl. Continue metformin.  Check LDL and urine mircroalbumin.

## 2012-10-04 ENCOUNTER — Telehealth: Payer: Self-pay | Admitting: Family Medicine

## 2012-10-04 ENCOUNTER — Encounter: Payer: Self-pay | Admitting: Family Medicine

## 2012-10-04 DIAGNOSIS — E1165 Type 2 diabetes mellitus with hyperglycemia: Secondary | ICD-10-CM

## 2012-10-04 NOTE — Telephone Encounter (Signed)
Called patient to discuss lab results:   The test results show that your are spilling a small amount of protein from your kidneys. This is most likely related to your diabetes. The best way to address this is improved blood sugar control and to start low dose of an ACE inhibitor which is a blood pressure medication that will protect your kidneys. Before starting this medication we need to check your kidney function with blood work and check it again two weeks later. This checks can be done as lab visit at the clinic.  I have ordered the first basic metabolic panel, please call and schedule a lab visit to get your blood drawn.   Secondly, while your LDL (bad cholesterol) has decreased with your exercise and weight loss. It is still slightly above goal of < 100. We have the option of waiting 3 mos and rechecking a fasting cholesterol panel to see if it gets to goal with additional diet and exercise or starting a cholesterol medication like crestor.   Plan:  Ordered BMP future order Review BMP, order lisinopril 5  Recheck BMP in 2 weeks.  Wait 3 months to recheck fasting lipid panel  Patient agrees with plan and voices understanding.

## 2012-10-14 ENCOUNTER — Other Ambulatory Visit: Payer: BLUE CROSS/BLUE SHIELD

## 2012-10-14 DIAGNOSIS — E1165 Type 2 diabetes mellitus with hyperglycemia: Secondary | ICD-10-CM

## 2012-10-14 LAB — BASIC METABOLIC PANEL
CO2: 27 mEq/L (ref 19–32)
Chloride: 102 mEq/L (ref 96–112)
Potassium: 4.1 mEq/L (ref 3.5–5.3)
Sodium: 139 mEq/L (ref 135–145)

## 2012-10-14 NOTE — Progress Notes (Signed)
BMP DONE TODAY Majel Giel 

## 2012-10-15 ENCOUNTER — Telehealth: Payer: Self-pay | Admitting: Family Medicine

## 2012-10-15 DIAGNOSIS — E119 Type 2 diabetes mellitus without complications: Secondary | ICD-10-CM

## 2012-10-15 MED ORDER — LISINOPRIL 5 MG PO TABS: 5.0000 mg | ORAL_TABLET | Freq: Every day | ORAL | Status: DC

## 2012-10-15 NOTE — Telephone Encounter (Signed)
Called patient. Left VM BMP normal Lisinopril 5 mg ordered for DM2. Patient to take and return for repeat BMP in two weeks.  Call for questions.

## 2012-11-04 ENCOUNTER — Other Ambulatory Visit: Payer: BLUE CROSS/BLUE SHIELD

## 2012-11-04 DIAGNOSIS — E119 Type 2 diabetes mellitus without complications: Secondary | ICD-10-CM

## 2012-11-04 LAB — BASIC METABOLIC PANEL
BUN: 10 mg/dL (ref 6–23)
CO2: 26 mEq/L (ref 19–32)
Chloride: 106 mEq/L (ref 96–112)
Creat: 0.64 mg/dL (ref 0.50–1.10)
Glucose, Bld: 189 mg/dL — ABNORMAL HIGH (ref 70–99)
Potassium: 3.8 mEq/L (ref 3.5–5.3)

## 2012-11-04 NOTE — Progress Notes (Signed)
BMP DONE TODAY Krystal Little 

## 2012-11-10 ENCOUNTER — Encounter: Payer: Self-pay | Admitting: Family Medicine

## 2012-11-19 ENCOUNTER — Other Ambulatory Visit: Payer: Self-pay | Admitting: *Deleted

## 2012-11-19 DIAGNOSIS — Z7901 Long term (current) use of anticoagulants: Secondary | ICD-10-CM

## 2012-11-19 DIAGNOSIS — I4891 Unspecified atrial fibrillation: Secondary | ICD-10-CM

## 2012-11-19 MED ORDER — WARFARIN SODIUM 5 MG PO TABS: 5.0000 mg | ORAL_TABLET | ORAL | Status: DC

## 2013-01-08 ENCOUNTER — Telehealth: Payer: Self-pay | Admitting: Family Medicine

## 2013-01-08 NOTE — Telephone Encounter (Signed)
Patient called about her medication Lisinopril 5 mg. She was wanting to know if she will get a refill or come in for another visit. If she is getting a refill we can use her pharmacy on file. JW

## 2013-01-08 NOTE — Telephone Encounter (Signed)
Will fwd to MD for review.  Jahmier Willadsen L, CMA  

## 2013-01-09 MED ORDER — LISINOPRIL 5 MG PO TABS: 5.0000 mg | ORAL_TABLET | Freq: Every day | ORAL | Status: DC

## 2013-01-09 NOTE — Telephone Encounter (Signed)
Refill sent in

## 2013-01-15 ENCOUNTER — Other Ambulatory Visit: Payer: Self-pay | Admitting: Family Medicine

## 2013-01-15 ENCOUNTER — Telehealth: Payer: Self-pay | Admitting: Family Medicine

## 2013-01-15 MED ORDER — METFORMIN HCL ER 500 MG PO TB24: 500.0000 mg | ORAL_TABLET | Freq: Two times a day (BID) | ORAL | Status: DC

## 2013-01-15 NOTE — Telephone Encounter (Signed)
Pt is requesting a refill on MetFormin 500mg  sent to her pharmacy. JW

## 2013-01-23 ENCOUNTER — Other Ambulatory Visit: Payer: Self-pay

## 2013-03-07 ENCOUNTER — Telehealth: Payer: Self-pay | Admitting: Cardiovascular Disease

## 2013-03-07 MED ORDER — DILTIAZEM HCL ER 240 MG PO CP24: 240.0000 mg | ORAL_CAPSULE | Freq: Every day | ORAL | Status: DC

## 2013-03-07 MED ORDER — ATENOLOL 50 MG PO TABS: 50.0000 mg | ORAL_TABLET | Freq: Every day | ORAL | Status: DC

## 2013-03-07 NOTE — Telephone Encounter (Signed)
Returned call.  Pt informed refills sent to last until appt.  No refills if appt missed next week.  Pt verbalized understanding and agreed w/ plan.

## 2013-03-07 NOTE — Telephone Encounter (Signed)
Pt was calling regarding her Rx that was faxed over from [facility].

## 2013-03-07 NOTE — Telephone Encounter (Signed)
Krystal Little is calling because she says she has called her prescription in since last week. Its her Diltiazem and Atenolol. She states she is completely out of medication and the Pharmacy has faxed it over again on today .Marland Kitchen    Thanks

## 2013-03-11 ENCOUNTER — Ambulatory Visit (INDEPENDENT_AMBULATORY_CARE_PROVIDER_SITE_OTHER): Payer: BLUE CROSS/BLUE SHIELD | Admitting: *Deleted

## 2013-03-11 ENCOUNTER — Encounter: Payer: Self-pay | Admitting: Family Medicine

## 2013-03-11 ENCOUNTER — Ambulatory Visit (INDEPENDENT_AMBULATORY_CARE_PROVIDER_SITE_OTHER): Payer: BLUE CROSS/BLUE SHIELD | Admitting: Family Medicine

## 2013-03-11 VITALS — BP 172/90 | HR 87 | Temp 97.7°F | Wt 159.0 lb

## 2013-03-11 DIAGNOSIS — I10 Essential (primary) hypertension: Secondary | ICD-10-CM

## 2013-03-11 DIAGNOSIS — Z23 Encounter for immunization: Secondary | ICD-10-CM

## 2013-03-11 DIAGNOSIS — E1159 Type 2 diabetes mellitus with other circulatory complications: Secondary | ICD-10-CM

## 2013-03-11 DIAGNOSIS — E1169 Type 2 diabetes mellitus with other specified complication: Secondary | ICD-10-CM

## 2013-03-11 DIAGNOSIS — I4891 Unspecified atrial fibrillation: Secondary | ICD-10-CM

## 2013-03-11 DIAGNOSIS — I152 Hypertension secondary to endocrine disorders: Secondary | ICD-10-CM

## 2013-03-11 DIAGNOSIS — E1165 Type 2 diabetes mellitus with hyperglycemia: Secondary | ICD-10-CM

## 2013-03-11 DIAGNOSIS — IMO0001 Reserved for inherently not codable concepts without codable children: Secondary | ICD-10-CM

## 2013-03-11 MED ORDER — DILTIAZEM HCL ER 240 MG PO CP24: 240.0000 mg | ORAL_CAPSULE | Freq: Every day | ORAL | Status: DC

## 2013-03-11 MED ORDER — CEPHALEXIN 500 MG PO CAPS: 500.0000 mg | ORAL_CAPSULE | Freq: Four times a day (QID) | ORAL | Status: DC

## 2013-03-11 MED ORDER — ATENOLOL 50 MG PO TABS: 50.0000 mg | ORAL_TABLET | Freq: Every day | ORAL | Status: DC

## 2013-03-11 MED ORDER — GLIMEPIRIDE 2 MG PO TABS: 2.0000 mg | ORAL_TABLET | Freq: Every day | ORAL | Status: DC

## 2013-03-11 NOTE — Assessment & Plan Note (Signed)
A: was not taking all BP meds bc was out (cardiologist usually refills), asymptomatic  P: Rx for atenolol, dilt, lisinopril; rtc 1 week for repeat BP

## 2013-03-11 NOTE — Patient Instructions (Signed)
Krystal Little it was great to meet you! I would like to see you back here in 1 week to see how you are doing with your new meds Start retaking the atenolol and diltiazem. Continue to take your metformin I have added glimepiride 2mg , watch for low blood sugars that may cause dizziness headaches, confusion and sleepiness if any of these should occur stop the medicine and call the office immediately You will take the new antibiotic for your skin infection for the next 7 days.  Bernadene Bell, MD

## 2013-03-11 NOTE — Assessment & Plan Note (Signed)
A: doing well with diet and exercise, last a1c 9.9 P: repeating today, cant go up on metformin 2/2 gi distress; added glyburide 2mg  daily

## 2013-03-11 NOTE — Progress Notes (Signed)
Patient ID: Krystal Little, female   DOB: 03-24-1956, 57 y.o.   MRN: [id number] [facility] Krystal Little, Krystal Little Phone: [phone]  Subjective:  Krystal Little is a 57 y.o F with hx of Afib, cardiac pacemaker on warfarin, HTN, DMII here for routine f/up  # HTN  -took only lisinopril this am, has been out of BP meds for several weeks and cardiologist would not fill without an appointment -no complaints of dizziness, chest pain, palps, change in vision  #DMII -24hr recall random day B ( AM)- oatmeal and banana  Snk ( AM)- cheese   L ( PM)- [place] and cheese sandwich Snk ( PM)- oatmeal cookies   D ( PM)- rice, cabbage, bbq chicken   Snk ( PM)- watermelon   -currently taking only metformin XL- 500BID, increasing metformin causes sig GI distress including diarrhea, compliant with med  -walks 2 miles a day, 3-4 days a week -no episodes of polyuria, hypoglycemia  #Wound -wound on left leg, with ulceration and induration; not currently draining pus -believes it started about 2 weeks ago -using topical neosporin  #on coumadin -needs level rechecked -compliant  All systems reviewed and were negative unless otherwise noted ROS--See HPI  Past Medical History Patient Active Problem List   Diagnosis Date Noted  . Hypertension associated with diabetes 10/16/2011  . Encounter for long-term (current) use of anticoagulants 08/27/2010  . DM (diabetes mellitus), type 2, uncontrolled 02/11/2008  . ATRIAL FIBRILLATION 01/31/2007  . STATUS, CARDIAC PACEMAKER 01/31/2007  . MITRAL REGURGITATION, 3-4 PLUS 09/13/2006   Reviewed problem list.  Medications- reviewed and updated Chief complaint-noted  Objective: BP 172/90  Pulse 87  Temp(Src) 97.7 F (36.5 C) (Oral)  Wt 159 lb (72.122 kg)  BMI 27.55 kg/m2 Gen: NAD, alert, cooperative with exam HEENT: NCAT, EOMI, PERRL, TMs nml, ophthalmic exam difficult to appreciate given room lighting and undilated eyes Neck: FROM,  supple CV: RRR, good S1/S2, no murmur, cap refill <3, palpable but weak DPs Resp: CTABL, no wheezes, non-labored Abd: SNTND, BS present, no guarding or organomegaly MSK: No edema, warm, normal tone, moves UE/LE spontaneously Neuro: Alert and oriented, No gross deficits Skin: no rashes 2cmX2cm lesion with ulceration and surrounding induration  Assessment/Plan: See problem based  Wound A: cannot recall insult, no presence of venous insufficiency, may be 2/2 to poor glycemic control, will treat with abx P:keflex QID for 7 days, f/up in 1 week

## 2013-03-12 ENCOUNTER — Telehealth (HOSPITAL_COMMUNITY): Payer: Self-pay | Admitting: Family Medicine

## 2013-03-12 NOTE — Telephone Encounter (Signed)
Left msg for patient regarding recent ha1c results. (7.2) she will fup with me in clinic in the next week

## 2013-03-14 ENCOUNTER — Other Ambulatory Visit: Payer: Self-pay | Admitting: *Deleted

## 2013-03-14 DIAGNOSIS — I4891 Unspecified atrial fibrillation: Secondary | ICD-10-CM

## 2013-03-14 LAB — PACEMAKER DEVICE OBSERVATION

## 2013-03-19 ENCOUNTER — Other Ambulatory Visit: Payer: Self-pay | Admitting: Cardiovascular Disease

## 2013-03-19 LAB — SEDIMENTATION RATE: Sed Rate: 5 mm/hr (ref 0–22)

## 2013-03-19 LAB — COMPREHENSIVE METABOLIC PANEL
ALT: 16 U/L (ref 0–35)
CO2: 26 mEq/L (ref 19–32)
Calcium: 9.2 mg/dL (ref 8.4–10.5)
Chloride: 102 mEq/L (ref 96–112)
Creat: 0.74 mg/dL (ref 0.50–1.10)
Glucose, Bld: 128 mg/dL — ABNORMAL HIGH (ref 70–99)
Sodium: 136 mEq/L (ref 135–145)
Total Bilirubin: 0.7 mg/dL (ref 0.3–1.2)
Total Protein: 7.8 g/dL (ref 6.0–8.3)

## 2013-03-19 LAB — CBC WITH DIFFERENTIAL/PLATELET
Basophils Relative: 1 % (ref 0–1)
Eosinophils Absolute: 0.4 10*3/uL (ref 0.0–0.7)
HCT: 41.6 % (ref 36.0–46.0)
Hemoglobin: 14.4 g/dL (ref 12.0–15.0)
Lymphs Abs: 2.3 10*3/uL (ref 0.7–4.0)
MCH: 30.8 pg (ref 26.0–34.0)
MCHC: 34.6 g/dL (ref 30.0–36.0)
Monocytes Absolute: 0.5 10*3/uL (ref 0.1–1.0)
Monocytes Relative: 8 % (ref 3–12)
Neutrophils Relative %: 48 % (ref 43–77)
RBC: 4.67 MIL/uL (ref 3.87–5.11)

## 2013-03-19 LAB — LIPID PANEL: Cholesterol: 184 mg/dL (ref 0–200)

## 2013-03-20 LAB — ANA: Anti Nuclear Antibody(ANA): NEGATIVE

## 2013-03-21 ENCOUNTER — Ambulatory Visit (HOSPITAL_COMMUNITY)
Admission: RE | Admit: 2013-03-21 | Discharge: 2013-03-21 | Disposition: A | Discharge status: Home / Self Care | Payer: BC Managed Care – PPO | Source: Ambulatory Visit | Attending: Cardiovascular Disease | Admitting: Cardiovascular Disease

## 2013-03-21 DIAGNOSIS — I4891 Unspecified atrial fibrillation: Secondary | ICD-10-CM | POA: Insufficient documentation

## 2013-03-21 DIAGNOSIS — I10 Essential (primary) hypertension: Secondary | ICD-10-CM | POA: Insufficient documentation

## 2013-03-21 DIAGNOSIS — E119 Type 2 diabetes mellitus without complications: Secondary | ICD-10-CM | POA: Insufficient documentation

## 2013-03-21 HISTORY — PX: TRANSTHORACIC ECHOCARDIOGRAM: SHX275

## 2013-03-21 NOTE — Progress Notes (Signed)
[facility]   2D echo completed 03/21/2013.   Jamison Neighbor, RDCS

## 2013-03-24 ENCOUNTER — Ambulatory Visit (INDEPENDENT_AMBULATORY_CARE_PROVIDER_SITE_OTHER): Payer: BC Managed Care – PPO | Admitting: Family Medicine

## 2013-03-24 ENCOUNTER — Encounter: Payer: Self-pay | Admitting: Family Medicine

## 2013-03-24 VITALS — BP 132/80 | HR 92 | Temp 98.2°F | Ht 63.0 in | Wt 162.0 lb

## 2013-03-24 DIAGNOSIS — Z23 Encounter for immunization: Secondary | ICD-10-CM

## 2013-03-24 DIAGNOSIS — E785 Hyperlipidemia, unspecified: Secondary | ICD-10-CM

## 2013-03-24 DIAGNOSIS — Z0000 Encounter for general adult medical examination without abnormal findings: Secondary | ICD-10-CM | POA: Insufficient documentation

## 2013-03-24 DIAGNOSIS — IMO0001 Reserved for inherently not codable concepts without codable children: Secondary | ICD-10-CM

## 2013-03-24 DIAGNOSIS — E1165 Type 2 diabetes mellitus with hyperglycemia: Secondary | ICD-10-CM

## 2013-03-24 DIAGNOSIS — T148XXD Other injury of unspecified body region, subsequent encounter: Secondary | ICD-10-CM | POA: Insufficient documentation

## 2013-03-24 DIAGNOSIS — T07XXXA Unspecified multiple injuries, initial encounter: Secondary | ICD-10-CM

## 2013-03-24 MED ORDER — ATORVASTATIN CALCIUM 20 MG PO TABS: 20.0000 mg | ORAL_TABLET | Freq: Every day | ORAL | Status: DC

## 2013-03-24 NOTE — Assessment & Plan Note (Signed)
Tdap given Pt to call for colonoscopy

## 2013-03-24 NOTE — Assessment & Plan Note (Addendum)
A:A1C 7.2 no hypoglycemic episodes P: Continue glyberide and metformin, needs retinal scan Have recommended ator 20mg , patient does not want to take med, will modify with diet and exercise Have instructed her of her lifetime risk, pt aware

## 2013-03-24 NOTE — Progress Notes (Signed)
Patient ID: Krystal Little, female   DOB: 1955/07/24, 57 y.o.   MRN: [id number] [facility] Bernadene Bell, MD Phone: [phone]  Subjective:   # f/up leg wound -finished 7 day course keflex, wound is healing well -still with scab, no edema  #DM -started glyberide last visit in addition to metformin tolerating well, last a1c 7.2 -med 100% compliance no side effects does not check BGs daily -no hypoglycemic episodes -needs retinal scan  #cards f/up -Echo stable from last visit -managing BP meds -LDL elevated to 110, prev 107 -patient attempting to diet and exercise  All systems were reviewed and were negative unless otherwise noted in the HPI  Past Medical History Patient Active Problem List   Diagnosis Date Noted  . Hypertension associated with diabetes 10/16/2011  . Encounter for long-term (current) use of anticoagulants 08/27/2010  . DM (diabetes mellitus), type 2, uncontrolled 02/11/2008  . ATRIAL FIBRILLATION 01/31/2007  . STATUS, CARDIAC PACEMAKER 01/31/2007  . MITRAL REGURGITATION, 3-4 PLUS 09/13/2006   Reviewed problem list.  Medications- reviewed and updated Chief complaint-noted No additions to family history Social history- patient is a non smoker  Objective: BP 132/80  Pulse 92  Temp(Src) 98.2 F (36.8 C) (Oral)  Ht 5\' 3"  (1.6 m)  Wt 162 lb (73.483 kg)  BMI 28.7 kg/m2 Gen: NAD, alert, cooperative with exam Ext: No edema, warm, normal tone, moves UE/LE spontaneously Neuro: Alert and oriented, No gross deficits Skin: no rashes no lesions, wound on leg healing well 2X2 cm erythema with central scab in tact  Assessment/Plan: See problem based a/p

## 2013-03-24 NOTE — Patient Instructions (Signed)
Orra it was great to see you today! I am glad to see that the wound is healing, do not pick at the scab it will fall off on its onw I will start a medication for your cholesterol which will help prevent strokes and heart attacks. If you notice muscle cramping or feelings of dehydration please call the clinic Call your eye doctor to schedule an appointment You should get your colonoscopy for cancer screening, this is very important, call the number to make an appointment  I will see you back in 2 weeks-1 month to see how you are tolerating that new medication  Bernadene Bell, MD

## 2013-03-24 NOTE — Assessment & Plan Note (Signed)
A: delayed wound healing in diabetic, looks much improved today P s/p 7 day course keflex, continue with topical dressings as needed

## 2013-04-04 ENCOUNTER — Telehealth: Payer: Self-pay | Admitting: Family Medicine

## 2013-04-04 DIAGNOSIS — E1165 Type 2 diabetes mellitus with hyperglycemia: Secondary | ICD-10-CM

## 2013-04-04 NOTE — Telephone Encounter (Signed)
Will forward to MD. Krystal Little,CMA  

## 2013-04-04 NOTE — Telephone Encounter (Signed)
Pt has taken all antibotics for insect bite. The bite has not gone away but it is better. Would like refill on antibotics. Please advise

## 2013-04-06 MED ORDER — CEPHALEXIN 500 MG PO CAPS: 500.0000 mg | ORAL_CAPSULE | Freq: Four times a day (QID) | ORAL | Status: DC

## 2013-04-06 NOTE — Telephone Encounter (Signed)
I don't think that more antibiotics will help at this point as the acute part of the infection seems to have resolved. She likely has poor wound healing given her diabetic status. I would recommend topical neosporin and bandages to protect healing skin. I will write for an additional 3 days of abx just to make sure we have covered but I suspect it will make no difference.

## 2013-04-07 ENCOUNTER — Ambulatory Visit (INDEPENDENT_AMBULATORY_CARE_PROVIDER_SITE_OTHER): Payer: BC Managed Care – PPO | Admitting: Family Medicine

## 2013-04-07 ENCOUNTER — Encounter: Payer: Self-pay | Admitting: Family Medicine

## 2013-04-07 VITALS — BP 132/80 | HR 64 | Temp 98.1°F | Ht 63.0 in | Wt 161.0 lb

## 2013-04-07 DIAGNOSIS — L0291 Cutaneous abscess, unspecified: Secondary | ICD-10-CM

## 2013-04-07 DIAGNOSIS — L0390 Cellulitis, unspecified: Secondary | ICD-10-CM | POA: Insufficient documentation

## 2013-04-07 MED ORDER — SULFAMETHOXAZOLE-TRIMETHOPRIM 800-160 MG PO TABS: 1.0000 | ORAL_TABLET | Freq: Two times a day (BID) | ORAL | Status: DC

## 2013-04-07 MED ORDER — MUPIROCIN CALCIUM 2 % EX CREA: TOPICAL_CREAM | Freq: Three times a day (TID) | CUTANEOUS | Status: DC

## 2013-04-07 NOTE — Progress Notes (Signed)
Patient ID: Krystal Little, female   DOB: 25-Nov-1955, 57 y.o.   MRN: [id number]  [facility] Burk Hoctor M. Rylen Hou, MD Phone: [phone]   Subjective: HPI: Patient is a 57 y.o. female presenting to clinic today for follow up on insect bite on left lower extremity. She was seen initially and treated with 7 day treatment of Keflex and it improved. After treatment the area was not sore, and it scabbed over. One week after she finished antibiotics it got sore again, red, swollen and firm. No drainage but does have ulceration. Patient is a non-smoker but does have history of diabetes. She has used baking soda and neosporin on the area with no relief.  History Reviewed: Non-smoker. Health Maintenance: UTD on flu shot  ROS: Please see HPI above.  Objective: Office vital signs reviewed. BP 132/80  Pulse 64  Temp(Src) 98.1 F (36.7 C) (Oral)  Ht 5\' 3"  (1.6 m)  Wt 161 lb (73.029 kg)  BMI 28.53 kg/m2  Physical Examination:  General: Awake, alert. NAD HEENT: Atraumatic, normocephalic. MMM Extremities: No edema. Knee and ankle joints with FROM and no tenderness Neuro: Grossly intact Skin: 3 cm hyperpigmented and firm area with central ulceration (no drainage). Surrounding erythema of 1-2 cm outside the area. Mild TTP  Assessment: 57 y.o. female with cellulitis  Plan: See Problem List and After Visit Summary

## 2013-04-07 NOTE — Patient Instructions (Addendum)
Please take the Bactrim twice daily. (Please tell the pharmacist that we do not need you to be on the Keflex now.) Use the ointment locally.  Please come back in 3-4 days for a wound check. We will check you INR then as well.  Yamel Bale M. Daliah Chaudoin, M.D.

## 2013-04-07 NOTE — Assessment & Plan Note (Signed)
Patient with insect bites many weeks ago now with cellulitis of area. Will treat with Bactrim for MRSA coverage. She is on warfarin and will need an INR check on antibiotics. Also prescribed Bactroban to use topically. RTC in 3-4 days for wound check. Given induration and tenderness of area, if she is not improving, consider I&D. F/u sooner if anything changes.

## 2013-04-08 NOTE — Telephone Encounter (Signed)
Left message for pt giving her advice from md.  [facility]

## 2013-04-10 ENCOUNTER — Encounter: Payer: Self-pay | Admitting: Cardiovascular Disease

## 2013-04-11 ENCOUNTER — Telehealth: Payer: Self-pay | Admitting: Family Medicine

## 2013-04-11 NOTE — Telephone Encounter (Signed)
Pt called and would like refill on metformin called in to the pharmacy. JW

## 2013-04-14 ENCOUNTER — Ambulatory Visit (INDEPENDENT_AMBULATORY_CARE_PROVIDER_SITE_OTHER): Payer: BC Managed Care – PPO | Admitting: Family Medicine

## 2013-04-14 ENCOUNTER — Encounter: Payer: Self-pay | Admitting: Family Medicine

## 2013-04-14 VITALS — BP 135/79 | HR 93 | Temp 97.7°F | Ht 63.0 in | Wt 159.0 lb

## 2013-04-14 DIAGNOSIS — L0390 Cellulitis, unspecified: Secondary | ICD-10-CM

## 2013-04-14 DIAGNOSIS — L0291 Cutaneous abscess, unspecified: Secondary | ICD-10-CM

## 2013-04-14 MED ORDER — METFORMIN HCL ER 500 MG PO TB24: 500.0000 mg | ORAL_TABLET | Freq: Two times a day (BID) | ORAL | Status: DC

## 2013-04-14 NOTE — Progress Notes (Signed)
Patient ID: Krystal Little, female   DOB: 06-21-1956, 57 y.o.   MRN: [id number]  [facility] Nakhi Choi M. Aven Cegielski, MD Phone: [phone]   Subjective: HPI: Patient is a 57 y.o. female presenting to clinic today for cellulitis follow up. Patient has had a persistent area on left lower extremity that has been slow to heal. She is currently on Bactrim day #7/10. She states the area is much better. No redness, swelling or pain. She continues to have some intermittent drainage from the site, and it is still firm but overall she feels it has improved. No fevers or systemic symptoms.  History Reviewed: Never smoker.  ROS: Please see HPI above.  Objective: Office vital signs reviewed. BP 135/79  Pulse 93  Temp(Src) 97.7 F (36.5 C) (Oral)  Ht 5\' 3"  (1.6 m)  Wt 159 lb (72.122 kg)  BMI 28.17 kg/m2  Physical Examination:  General: Awake, alert. NAD. Nontoxic appearing Cardio: RRR, no murmurs appreciated Abdomen:+BS, soft, nontender, nondistended Extremities: Trace edema, 3cm firm hyperpigmented area lateral left leg distal to the knee. Central area of drainage/scab. Diffuse peeling of the area. No erythema around the site.  Neuro: Grossly intact  Assessment: 57 y.o. female with resolving cellulitis  Plan: See Problem List and After Visit Summary

## 2013-04-14 NOTE — Assessment & Plan Note (Signed)
Resolving. Con't rest of Bactrim Rx, follow up if red, painful, or develops fever. Expect it to continue to resolve at this point. If not, may still need I&D but deferred today.

## 2013-04-14 NOTE — Telephone Encounter (Signed)
LMOVM informing pt that "rx requested was sent in". Fleeger, Krystal Little

## 2013-04-14 NOTE — Patient Instructions (Addendum)
Continue the rest of your antibiotics.  Let us know if it gets more swollen, painful, red or if you have a fever.  Krystal Little M. Krystal Little, M.D.

## 2013-04-14 NOTE — Telephone Encounter (Signed)
Have given pt rx for 3 months. Thanks for making me aware!  [facility], MD

## 2013-05-16 ENCOUNTER — Telehealth: Payer: Self-pay | Admitting: Internal Medicine

## 2013-05-16 NOTE — Telephone Encounter (Signed)
05-16-13 lmm @ 1017am for pt to set up device fu with klein, former [facility] pt, offered 05-22-13/mt

## 2013-06-02 ENCOUNTER — Telehealth: Payer: Self-pay

## 2013-06-02 DIAGNOSIS — L98491 Non-pressure chronic ulcer of skin of other sites limited to breakdown of skin: Secondary | ICD-10-CM

## 2013-06-02 NOTE — Telephone Encounter (Signed)
Referral placed for wound care. Will await scheduling.  Amber M. Hairford, M.D.

## 2013-06-02 NOTE — Telephone Encounter (Signed)
Would like to be referred to wound care center for the wound that does not heal. Has been seen twice for this issue. Please call patient once referral has been placed.

## 2013-06-02 NOTE — Telephone Encounter (Signed)
Pt is aware of this. Chanoch Mccleery,CMA  

## 2013-07-03 ENCOUNTER — Telehealth: Payer: Self-pay | Admitting: Internal Medicine

## 2013-07-03 ENCOUNTER — Encounter: Payer: Self-pay | Admitting: Internal Medicine

## 2013-07-03 NOTE — Telephone Encounter (Signed)
07-03-13 no response to message left 05-16-13, sent schedule letter, needs klein fu device, former [facility] pt/mt

## 2013-07-14 ENCOUNTER — Encounter: Payer: Self-pay | Admitting: Family Medicine

## 2013-07-14 ENCOUNTER — Ambulatory Visit (INDEPENDENT_AMBULATORY_CARE_PROVIDER_SITE_OTHER): Payer: BC Managed Care – PPO | Admitting: Family Medicine

## 2013-07-14 VITALS — BP 146/83 | HR 89 | Temp 98.5°F | Ht 63.0 in | Wt 161.0 lb

## 2013-07-14 DIAGNOSIS — T07XXXA Unspecified multiple injuries, initial encounter: Secondary | ICD-10-CM

## 2013-07-14 DIAGNOSIS — T148XXD Other injury of unspecified body region, subsequent encounter: Secondary | ICD-10-CM

## 2013-07-14 NOTE — Progress Notes (Signed)
   Subjective:    Patient ID: Krystal Little, female    DOB: [birth year], 57 y.o.   MRN: [id number]  HPI  CC: left leg wound  # Leg wound - started from insect bite over the summer. Treated with keflex x 7 days and bactrim x 10 days in September.  - worsened last month with multiple areas of drainage, but thinks it has gotten better in the recent weeks - area is only a little tender - has tried bacitracin ointment, goldbond, witch-hazel. Using bandages to keep area clean - gets occasional lower leg swelling bilaterally, not currently swollen - denies any numbness/tingling over affected skin or leg.   Review of Systems No fevers/chills. No CP, no SOB.    Objective:   Physical Exam BP 146/83  Pulse 89  Temp(Src) 98.5 F (36.9 C) (Oral)  Ht 5\' 3"  (1.6 m)  Wt 161 lb (73.029 kg)  BMI 28.53 kg/m2  General: NAD CV: RRR, normal heart sounds Resp: CTAB, effort normal Skin: left lateral calf with chronic wound, 5 areas of drainage. No abscesses or areas of fluctuance noted. See picture below. Mild tenderness to areas of drainage.       Assessment & Plan:  See Problem List documentation

## 2013-07-14 NOTE — Assessment & Plan Note (Signed)
Continued chronic wound on left lower leg. Got worse in the past month but she thinks it is healing. Multiple areas of drainage. Does not appear to be infected, so will hold off on antibiotics. Keep wound clinic appt on 1/7. See AVS for additional patient instructions.

## 2013-07-14 NOTE — Patient Instructions (Signed)
Keep your appointment with the Wound Clinic on 1/7.   Avoid using any irritants on the affected area like alcohol, harsh soap. Keep the area dry with gauze bandages. We will hold off on repeating antibiotics at this point since it does not look like the area is infected. If it becomes more painful, you develop fevers/chills, numbness/tingling, please call the clinic or go to the ED.  Dressing Change A dressing is a material placed over wounds. It keeps the wound clean, dry, and protected from further injury. This provides an environment that favors wound healing.  BEFORE YOU BEGIN  Get your supplies together. Things you may need include:  Saline solution.  Flexible gauze dressing.  Medicated cream.  Tape.  Gloves.  Abdominal dressing pads.  Gauze squares.  Plastic bags.  Take pain medicine 30 minutes before the dressing change if you need it.  Take a shower before you do the first dressing change of the day. Use plastic wrap or a plastic bag to prevent the dressing from getting wet. REMOVING YOUR OLD DRESSING   Wash your hands with soap and water. Dry your hands with a clean towel.  Put on your gloves.  Remove any tape.  Carefully remove the old dressing. If the dressing sticks, you may dampen it with warm water to loosen it, or follow your caregiver's specific directions.  Remove any gauze or packing tape that is in your wound.  Take off your gloves.  Put the gloves, tape, gauze, or any packing tape into a plastic bag. CHANGING YOUR DRESSING  Open the supplies.  Take the cap off the saline solution.  Open the gauze package so that the gauze remains on the inside of the package.  Put on your gloves.  Clean your wound as told by your caregiver.  If you have been told to keep your wound dry, follow those instructions.  Your caregiver may tell you to do one or more of the following:  Pick up the gauze. Pour the saline solution over the gauze. Squeeze out the  extra saline solution.  Put medicated cream or other medicine on your wound if you have been told to do so.  Put the solution soaked gauze only in your wound, not on the skin around it.  Pack your wound loosely or as told by your caregiver.  Put dry gauze on your wound.  Put abdominal dressing pads over the dry gauze if your wet gauze soaks through.  Tape the abdominal dressing pads in place so they will not fall off. Do not wrap the tape completely around the affected part (arm, leg, abdomen).  Wrap the dressing pads with a flexible gauze dressing to secure it in place.  Take off your gloves. Put them in the plastic bag with the old dressing. Tie the bag shut and throw it away.  Keep the dressing clean and dry until your next dressing change.  Wash your hands. SEEK MEDICAL CARE IF:  Your skin around the wound looks red.  Your wound feels more tender or sore.  You see pus in the wound.  Your wound smells bad.  You have a fever.  Your skin around the wound has a rash that itches and burns.  You see black or yellow skin in your wound that was not there before.  You feel nauseous, throw up, and feel very tired. Document Released: 08/10/2004 Document Revised: 09/25/2011 Document Reviewed: 05/15/2011 [facility] Patient Information 2014 [place], [place].

## 2013-07-23 ENCOUNTER — Encounter (HOSPITAL_BASED_OUTPATIENT_CLINIC_OR_DEPARTMENT_OTHER): Payer: BC Managed Care – PPO | Attending: General Surgery

## 2013-07-23 DIAGNOSIS — I059 Rheumatic mitral valve disease, unspecified: Secondary | ICD-10-CM | POA: Insufficient documentation

## 2013-07-23 DIAGNOSIS — Z79899 Other long term (current) drug therapy: Secondary | ICD-10-CM | POA: Insufficient documentation

## 2013-07-23 DIAGNOSIS — L97809 Non-pressure chronic ulcer of other part of unspecified lower leg with unspecified severity: Secondary | ICD-10-CM | POA: Insufficient documentation

## 2013-07-23 DIAGNOSIS — Z7901 Long term (current) use of anticoagulants: Secondary | ICD-10-CM | POA: Insufficient documentation

## 2013-07-23 DIAGNOSIS — M7989 Other specified soft tissue disorders: Secondary | ICD-10-CM | POA: Insufficient documentation

## 2013-07-23 DIAGNOSIS — I10 Essential (primary) hypertension: Secondary | ICD-10-CM | POA: Insufficient documentation

## 2013-07-23 DIAGNOSIS — Z950 Presence of cardiac pacemaker: Secondary | ICD-10-CM | POA: Insufficient documentation

## 2013-07-23 LAB — GLUCOSE, CAPILLARY: GLUCOSE-CAPILLARY: 106 mg/dL — AB (ref 70–99)

## 2013-07-24 NOTE — Progress Notes (Signed)
[facility]  NAME:  Krystal Little, Krystal Little              ACCOUNT NO.:  [id number]  MEDICAL RECORD NO.:  [id number]      DATE OF BIRTH:  24-Jan-1956  PHYSICIAN:  Judene Companion, M.D.      VISIT DATE:  07/23/2013                                  OFFICE VISIT   This is a 58 year old, African American female who sometime this summer had an insect bite on her lateral aspect of her left leg.  It subsequently became infected.  She has been seen by her family doctor, and had been placed on Keflex.  Later, she was placed on Bactrim and Bactroban ointment.  She actually has 4 ulcers about 5 mm in diameter, a piece on the lateral aspect of her left leg.  She does not appear to have much in the way of cellulitis, but she does have some swelling of this leg.  She is now on Bactrim DS twice a day, and we will keep her on this and we will also wrap her leg with compression with Profore Lite and we put some silver alginate on the wounds.  We also got a culture and we will see what that shows, and either leave her on the Bactrim or change to doxycycline.  The rest of her history is fairly benign.  She is not a diabetic and the only medicine that she takes is lisinopril for hypertension and atenolol.  She does take Coumadin 5 mg a day because of mitral valve problems.  She does have a pacemaker.  Her blood pressure was 142/84, respirations 18, pulse 73, temperature 98.5.  She weighs 161 pounds and is 5 foot 3.  We will see her in a week.     Judene Companion, M.D.     PP/MEDQ  D:  07/23/2013  T:  07/24/2013  Job:  [id number]

## 2013-07-29 ENCOUNTER — Encounter: Payer: Self-pay | Admitting: *Deleted

## 2013-07-30 LAB — GLUCOSE, CAPILLARY: Glucose-Capillary: 134 mg/dL — ABNORMAL HIGH (ref 70–99)

## 2013-08-06 LAB — GLUCOSE, CAPILLARY: GLUCOSE-CAPILLARY: 183 mg/dL — AB (ref 70–99)

## 2013-08-13 LAB — GLUCOSE, CAPILLARY: Glucose-Capillary: 214 mg/dL — ABNORMAL HIGH (ref 70–99)

## 2013-08-20 ENCOUNTER — Encounter (HOSPITAL_BASED_OUTPATIENT_CLINIC_OR_DEPARTMENT_OTHER): Payer: BC Managed Care – PPO | Attending: General Surgery

## 2013-08-20 DIAGNOSIS — L97809 Non-pressure chronic ulcer of other part of unspecified lower leg with unspecified severity: Secondary | ICD-10-CM | POA: Insufficient documentation

## 2013-08-20 DIAGNOSIS — I10 Essential (primary) hypertension: Secondary | ICD-10-CM | POA: Insufficient documentation

## 2013-08-20 LAB — GLUCOSE, CAPILLARY: Glucose-Capillary: 171 mg/dL — ABNORMAL HIGH (ref 70–99)

## 2013-11-03 ENCOUNTER — Other Ambulatory Visit: Payer: Self-pay | Admitting: Family Medicine

## 2013-11-24 ENCOUNTER — Telehealth: Payer: Self-pay | Admitting: Cardiovascular Disease

## 2013-11-24 DIAGNOSIS — I10 Essential (primary) hypertension: Secondary | ICD-10-CM

## 2013-11-24 NOTE — Telephone Encounter (Signed)
Pt still have not gotten her medicine,pharmacist told her to call is. She need a refill for Diltiazem 180 mg #30. Please call to [facility]-[phone].

## 2013-11-25 MED ORDER — DILTIAZEM HCL ER 240 MG PO CP24: 240.0000 mg | ORAL_CAPSULE | Freq: Every day | ORAL | Status: DC

## 2013-11-25 NOTE — Addendum Note (Signed)
Addended by: Diana Eves on: 11/25/2013 11:19 AM   Modules accepted: Orders

## 2013-11-25 NOTE — Telephone Encounter (Signed)
Did not hear from anyone yesterday and rx not filled  Has been out of 3-4 days  Please call

## 2013-11-25 NOTE — Telephone Encounter (Signed)
Rx was sent to pharmacy electronically. 

## 2013-11-26 ENCOUNTER — Telehealth: Payer: Self-pay | Admitting: Cardiovascular Disease

## 2013-11-26 MED ORDER — DILTIAZEM HCL ER COATED BEADS 180 MG PO CP24: 180.0000 mg | ORAL_CAPSULE | Freq: Every day | ORAL | Status: DC

## 2013-11-26 NOTE — Telephone Encounter (Signed)
Rx was sent to pharmacy electronically. Returned call to patient, apologizing for the mix-up and let her know it was sent in correctly. Paper chart wasn't clear as to what milligram she was taking.

## 2013-11-26 NOTE — Telephone Encounter (Signed)
When she went to get her Diltiazem yesterday, the milligram was wrong. It should be 180 mg. Would you please call this in this morning please. Call to [facility]-[phone].

## 2013-12-18 ENCOUNTER — Telehealth: Payer: Self-pay | Admitting: Cardiovascular Disease

## 2013-12-18 NOTE — Telephone Encounter (Signed)
Pt need a new prescription for her Atenolol 50 mg #30. Please call to [facility] [address].

## 2013-12-22 ENCOUNTER — Ambulatory Visit (INDEPENDENT_AMBULATORY_CARE_PROVIDER_SITE_OTHER): Payer: No Typology Code available for payment source | Admitting: Family Medicine

## 2013-12-22 ENCOUNTER — Encounter: Payer: Self-pay | Admitting: Family Medicine

## 2013-12-22 ENCOUNTER — Ambulatory Visit (INDEPENDENT_AMBULATORY_CARE_PROVIDER_SITE_OTHER): Payer: No Typology Code available for payment source | Admitting: *Deleted

## 2013-12-22 VITALS — BP 113/83 | HR 116 | Temp 98.1°F | Wt 153.0 lb

## 2013-12-22 DIAGNOSIS — I4891 Unspecified atrial fibrillation: Secondary | ICD-10-CM

## 2013-12-22 DIAGNOSIS — Z7901 Long term (current) use of anticoagulants: Secondary | ICD-10-CM

## 2013-12-22 DIAGNOSIS — R059 Cough, unspecified: Secondary | ICD-10-CM | POA: Insufficient documentation

## 2013-12-22 DIAGNOSIS — R05 Cough: Secondary | ICD-10-CM

## 2013-12-22 LAB — POCT INR: INR: 1.9

## 2013-12-22 MED ORDER — FLUTICASONE PROPIONATE 50 MCG/ACT NA SUSP: 2.0000 | Freq: Every day | NASAL | Status: DC

## 2013-12-22 MED ORDER — ATENOLOL 50 MG PO TABS: 50.0000 mg | ORAL_TABLET | Freq: Every day | ORAL | Status: DC

## 2013-12-22 NOTE — Telephone Encounter (Signed)
Pt still have not received her Krystal Little is completely out of it.Please call to [phone].

## 2013-12-22 NOTE — Patient Instructions (Signed)
Dear Ms. Lamoreaux,   Thank you for coming to clinic today. Please read below regarding the issues that we discussed.   1. Cough and sore throat - I think that this is related to nasal drainge. This could be due to allergies or a virus. We should try a steroid nasal spray for two weeks to see if this helps.   Please follow up in clinic in 2 weeks. Please call earlier if you have any questions or concerns.   Sincerely,   Dr. Maricela Bo

## 2013-12-22 NOTE — Progress Notes (Signed)
   Subjective:    Patient ID: Krystal Little, female    DOB: Dec 09, 1955, 58 y.o.   MRN: [id number]  HPI  Cough and sore throat - occurrs at night, duration 1 week, not associated with fever, chills, sinus pain, nasal drainage, dysphagia or odynophagia  PMH - no allergies, no GERD; pt is on an ACE-I  Review of Systems No shortness of breath, fever, chills, swollen lymph nodes     Objective:   Physical Exam BP 113/83  Pulse 116  Temp(Src) 98.1 F (36.7 C) (Oral)  Wt 153 lb (69.4 kg)  Gen: elderly AAF, well appearing, pleasnat Face: hypertrichosis HEENT: NCAT, PERRLA, EOMI, OP clear and moist, no oropharyngeal exudate, no lymphadenopathy, neck with normal ROM, no meningismus, nares with inflammation and clear rhinorrhea  CV: irregularly irregular Pulm: normal WOB, CTA-B        Assessment & Plan:

## 2013-12-22 NOTE — Telephone Encounter (Signed)
Spoke with patient and let her know the RX refill was sent in but she needed an appointment in order to continue getting refills.

## 2013-12-22 NOTE — Assessment & Plan Note (Signed)
A: Likely 2/2 rhinitis from allergies or virus, possible cause is GERD P: try steroid nasal spray for 2 weeks, if not improved, consider PPI

## 2013-12-25 ENCOUNTER — Encounter: Payer: Self-pay | Admitting: *Deleted

## 2013-12-25 ENCOUNTER — Telehealth: Payer: Self-pay | Admitting: Cardiology

## 2013-12-25 NOTE — Telephone Encounter (Signed)
Certified letter mailed.

## 2014-01-19 ENCOUNTER — Other Ambulatory Visit: Payer: Self-pay | Admitting: Family Medicine

## 2014-01-20 NOTE — Telephone Encounter (Signed)
I will go ahead and refill her medications for now. When is the last time that patient came to coumadin clinic?  [facility], MD

## 2014-01-21 NOTE — Telephone Encounter (Signed)
Pt was seen in coumadin clinic 12/22/2013.  Derl Barrow, RN

## 2014-03-12 ENCOUNTER — Other Ambulatory Visit: Payer: Self-pay | Admitting: Family Medicine

## 2014-03-12 ENCOUNTER — Other Ambulatory Visit: Payer: Self-pay | Admitting: Internal Medicine

## 2014-03-12 MED ORDER — ATENOLOL 50 MG PO TABS: 50.0000 mg | ORAL_TABLET | Freq: Every day | ORAL | Status: DC

## 2014-03-12 NOTE — Telephone Encounter (Signed)
Rx was sent to pharmacy electronically. Patient has OV 11/23 with Dr. Ellyn Hack

## 2014-04-03 ENCOUNTER — Telehealth: Payer: Self-pay | Admitting: Cardiology

## 2014-04-03 MED ORDER — DILTIAZEM HCL ER COATED BEADS 180 MG PO CP24: 180.0000 mg | ORAL_CAPSULE | Freq: Every day | ORAL | Status: DC

## 2014-04-03 NOTE — Telephone Encounter (Signed)
Pt need a refill on Diltiazem ?mg #30. Please call to [phone].

## 2014-04-03 NOTE — Telephone Encounter (Signed)
Patient takes diltiazem 180mg  once daily. Patient was reminded of office visit on 06/08/14 with Dr. Ellyn Hack  Rx was sent to pharmacy electronically.

## 2014-04-10 ENCOUNTER — Ambulatory Visit (INDEPENDENT_AMBULATORY_CARE_PROVIDER_SITE_OTHER): Payer: No Typology Code available for payment source | Admitting: *Deleted

## 2014-04-10 DIAGNOSIS — Z23 Encounter for immunization: Secondary | ICD-10-CM

## 2014-04-15 ENCOUNTER — Other Ambulatory Visit: Payer: Self-pay | Admitting: Family Medicine

## 2014-05-18 ENCOUNTER — Encounter: Payer: Self-pay | Admitting: Family Medicine

## 2014-05-18 ENCOUNTER — Other Ambulatory Visit: Payer: Self-pay | Admitting: Family Medicine

## 2014-05-26 ENCOUNTER — Other Ambulatory Visit: Payer: Self-pay | Admitting: Family Medicine

## 2014-06-03 ENCOUNTER — Encounter: Payer: Self-pay | Admitting: *Deleted

## 2014-06-08 ENCOUNTER — Ambulatory Visit (INDEPENDENT_AMBULATORY_CARE_PROVIDER_SITE_OTHER): Payer: No Typology Code available for payment source | Admitting: Cardiology

## 2014-06-08 ENCOUNTER — Encounter: Payer: Self-pay | Admitting: Cardiology

## 2014-06-08 VITALS — BP 134/78 | HR 64 | Ht 64.0 in | Wt 162.8 lb

## 2014-06-08 DIAGNOSIS — Z950 Presence of cardiac pacemaker: Secondary | ICD-10-CM

## 2014-06-08 DIAGNOSIS — I152 Hypertension secondary to endocrine disorders: Secondary | ICD-10-CM

## 2014-06-08 DIAGNOSIS — E785 Hyperlipidemia, unspecified: Secondary | ICD-10-CM

## 2014-06-08 DIAGNOSIS — I482 Chronic atrial fibrillation, unspecified: Secondary | ICD-10-CM

## 2014-06-08 DIAGNOSIS — E1159 Type 2 diabetes mellitus with other circulatory complications: Secondary | ICD-10-CM

## 2014-06-08 DIAGNOSIS — E1169 Type 2 diabetes mellitus with other specified complication: Secondary | ICD-10-CM

## 2014-06-08 DIAGNOSIS — Z79899 Other long term (current) drug therapy: Secondary | ICD-10-CM

## 2014-06-08 DIAGNOSIS — I080 Rheumatic disorders of both mitral and aortic valves: Secondary | ICD-10-CM

## 2014-06-08 DIAGNOSIS — E1165 Type 2 diabetes mellitus with hyperglycemia: Secondary | ICD-10-CM

## 2014-06-08 DIAGNOSIS — I10 Essential (primary) hypertension: Secondary | ICD-10-CM

## 2014-06-08 DIAGNOSIS — IMO0001 Reserved for inherently not codable concepts without codable children: Secondary | ICD-10-CM

## 2014-06-08 DIAGNOSIS — Z9889 Other specified postprocedural states: Secondary | ICD-10-CM

## 2014-06-08 MED ORDER — ATENOLOL 50 MG PO TABS: 50.0000 mg | ORAL_TABLET | Freq: Every day | ORAL | Status: DC

## 2014-06-08 MED ORDER — DILTIAZEM HCL ER COATED BEADS 180 MG PO CP24: 180.0000 mg | ORAL_CAPSULE | Freq: Every day | ORAL | Status: DC

## 2014-06-08 MED ORDER — LISINOPRIL 5 MG PO TABS: 5.0000 mg | ORAL_TABLET | Freq: Every day | ORAL | Status: DC

## 2014-06-08 NOTE — Patient Instructions (Signed)
Your physician recommends that you schedule a follow-up appointment in Device clinc-pacemaker  [place]  Your physician wants you to follow-up in 12 MONTHS Dr Ellyn Hack.  You will receive a reminder letter in the mail two months in advance. If you don't receive a letter, please call our office to schedule the follow-up appointment.

## 2014-06-10 ENCOUNTER — Encounter: Payer: Self-pay | Admitting: Cardiology

## 2014-06-10 DIAGNOSIS — E1165 Type 2 diabetes mellitus with hyperglycemia: Secondary | ICD-10-CM

## 2014-06-10 DIAGNOSIS — IMO0001 Reserved for inherently not codable concepts without codable children: Secondary | ICD-10-CM | POA: Insufficient documentation

## 2014-06-10 NOTE — Assessment & Plan Note (Addendum)
Well controlled today. On beta blocker, calcium channel blocker and ACE inhibitor. The addition of ACE inhibitor with a good choice in the setting of diabetes. If her blood pressure shows signs of increasing, would increase ACE inhibitor first. To evaluate for additional cardiac risk assessment, we will check lipid panel with [facility]

## 2014-06-10 NOTE — Progress Notes (Signed)
PATIENT: Krystal Little MRN: [id number] DOB: Feb 05, 1956 PCP: Langston Masker, MD  Clinic Note: Chief Complaint  Patient presents with  . New Evaluation    former patient of Dr Yolanda Bonine, no chest pain , no short of breathe, no edema, patient has pacemaker  . Mitral Regurgitation    s/p MVR  . Atrial Fibrillation    Chronic - on Warfarin; SSS - s/p PM   HPI: Krystal Little is a 58 y.o. female with a PMH below who presents today for establish a new cardiologist at the retirement of Dr. Terance Ice.  She has not been seen by [facility]. Long-standing cardiac history with rheumatic heart disease the child that subsequently that to moderate-severe mitral regurgitation with prolapse. In late December 2000, she presented with acute syncope with sick sinus syndrome.  She underwent mitral valve repair with ring annuloplasty and transposition of chordae by Dr. Cyndia Bent, followed by pacemaker placement by Dr. Rollene Fare. She has had chronic atrial fibrillation as well as atrial flutter with attempted ablation of complex atrial flutter at [facility]. She has tried and failed antiarrhythmics including sotalol and Tikosyn.  Interval History: Krystal Little presents today really without any major cardiac complaints. She just felt like it was probably worth while to go ahead and start with a new cardiologist. Really since her bowel surgery, she has not had any problems at all with heart failure symptoms of PND, orthopnea or edema the exception of very distant episode. Dr. Rollene Fare and ordered an echocardiogram prior to his retirement, this was done showed relatively normal mitral valve repair and normal EF. She denies any sensation of rapid beats, syncope/near syncope or TIA/ amaurosis fugax.  The remainder of cardiac review of systems is as follows: Cardiovascular ROS: no chest pain or dyspnea on exertion positive for - Weight gain negative for - chest pain, dyspnea on exertion, edema,  irregular heartbeat, loss of consciousness, orthopnea, palpitations, paroxysmal nocturnal dyspnea, rapid heart rate, shortness of breath or melena, hematochezia, hematuria or epistaxis : Also, no claudication.  Past Medical History  Diagnosis Date  . 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 Chronic atrial fibrillation 1990s    s/p DCCV then attempted ablation of complex A Flutter (@ [facility] - Dr. Deno Etienne), failed antiarrhythmics --> INR folllwed @ [facility]  . Hx of sick sinus syndrome 07/1999    Wtih symptomatic bradycardia - syncope (Tachy-Brady)  . 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 History of cardiac catheterization 2001    R&LHC - normal Coronaries, no evidence of Restictive Cardiomyopathy or Constrictive Pericarditis (also @ [facility])  . Diabetes mellitus type 2, uncontrolled, without complications     On Oral Medications ([facility])  . Essential hypertension     Prior Cardiac Evaluation and Past Surgical History: Past Surgical History  Procedure Laterality Date  . Mitral valve repair  07/1999    Both Ant& Post leaflet repair - quadrangular resection&caudal transposition, #32 Sequin Annuloplasty ring  . Transthoracic echocardiogram  03/21/2013  . Cardiac catheterization  08/03/1999    [facility] - normal Coronaries; no sign of constriction or restrictive cardiomypathy  . Pacemaker insertion  07/1999  for SSS in setting of Chronic Afib; Medtronic Kappa [id number], [id number] H.  . Pacemaker generator change  11/26/2008    Medtronic Adapta L(? if it has been checked since 02/2013)    No Known Allergies  Current Outpatient Prescriptions  Medication Sig Dispense Refill    . atenolol (TENORMIN) 50 MG tablet Take 1 tablet (50 mg total) by mouth daily. 30 tablet 11  . diltiazem (CARDIZEM CD) 180 MG 24 hr capsule Take 1 capsule (180 mg total) by mouth daily. 30 capsule 11  . fluticasone (FLONASE) 50 MCG/ACT nasal spray Place 2 sprays into both nostrils daily. (Patient taking differently: Place 2 sprays into both nostrils as needed. ) 16 g 6  . glimepiride (AMARYL) 2 MG tablet TAKE ONE TABLET BY MOUTH ONCE DAILY BEFORE BREAKFAST 90 tablet 0  . lisinopril (PRINIVIL,ZESTRIL) 5 MG tablet Take 1 tablet (5 mg total) by mouth daily. 30 tablet 11  . metFORMIN (GLUCOPHAGE-XR) 500 MG 24 hr tablet TAKE ONE TABLET BY MOUTH TWICE DAILY WITH  A  MEAL 60 tablet 0  . warfarin (COUMADIN) 5 MG tablet TAKE ONE TABLET BY MOUTH AS  DIRECTED 90 tablet 0  . Blood Glucose Monitoring Suppl (ONE TOUCH ULTRA SYSTEM KIT) W/DEVICE KIT 1 kit by Does not apply route once. Check blood sugar fasting and throughout the day, 3-4 x/ week. Goal fasting < 130 Goal mid-day/non-fasting < 200 1 each 0   No current facility-administered medications for this visit.    History   Social History Narrative   Lives with husband, great-niece (4)  and great-nephew (24).    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   1 son died in an automobile accident @ age 68 (2010).    She raised several nieces & nephews - 1 nephew lives with her.   Still works @ [employer] adjust[employer] ([employer]) in [place], [place]   Routinely walks 2-3 days/week.    family history includes Diabetes in her sister; Hypertension in her mother; Lung cancer in her brother.  ROS: A comprehensive Review of Systems - was performed Review of Systems  Constitutional: Negative for weight loss and malaise/fatigue.  HENT: Negative for nosebleeds.   Eyes: Negative for blurred vision.  Respiratory: Negative for shortness of breath.   Cardiovascular: Negative for claudication.  Gastrointestinal: Negative for blood in stool and  melena.  Genitourinary: Negative for hematuria.  Musculoskeletal: Negative for myalgias, joint pain and falls.  Neurological: Negative for dizziness, sensory change, speech change, focal weakness, seizures, loss of consciousness and headaches.  Endo/Heme/Allergies: Negative.  Does not bruise/bleed easily.  Psychiatric/Behavioral: Negative.  Negative for depression.   PHYSICAL EXAM BP 134/78 mmHg  Pulse 64  Ht _0  (1.626 m)  Wt 162 lb 12.8 oz (73.846 kg)  BMI 27.93 kg/m2 Physical Exam  Constitutional: She is oriented to person, place, and time. She appears well-developed and well-nourished. No distress.  Alert, pleasant, appears stated age  HENT:  Head: Normocephalic and atraumatic.  Mouth/Throat: Oropharynx is clear and moist. No oropharyngeal exudate.  Eyes: EOM are normal. Pupils are equal, round, and reactive to light. No scleral icterus.  Neck: Normal range of motion. Neck supple. Normal carotid pulses and no hepatojugular reflux present. Carotid bruit is not present. No tracheal deviation present. No thyromegaly present.  Mild cannon A waves, but no JVD  Pulmonary/Chest: Effort normal and breath sounds normal. No respiratory distress. She has no wheezes. She has no rales. She exhibits no tenderness.  Abdominal: Soft.  Bowel sounds are normal. She exhibits no distension and no mass. There is no tenderness. There is no rebound and no guarding.  Genitourinary:  deferred  Musculoskeletal: Normal range of motion.  Strength 5/5 t/o  Neurological: She is alert and oriented to person, place, and time. No cranial nerve deficit.  Skin: Skin is warm and dry. No rash noted. No erythema.  Psychiatric: She has a normal mood and affect. Her behavior is normal. Judgment and thought content normal.    Adult ECG Report  Rate: 64 ;  Rhythm: indeterminate underlying rhythm, but likely A. Fib. V-paced with intermittent consecutive native QRS complexes suggesting LVH with repolarization changes.   QRS Axis: -74 (V paced) ;  PR Interval: NA ;  QRS Duration: N/A ; QTc: N/A  Voltages: high - in native complexes - ? CRO LVH  Narrative Interpretation: as compared to most recent EKG, underlying rhythm remains atrial fibrillation, however ventricular paced beats are more frequent. The native beats suggestive of LVH with repolarization changes are stable.  Recent Labs:  Lab Results  Component Value Date   INR 1.9 12/22/2013   INR 1.9 03/11/2013   INR 1.8 10/02/2012   Lab Results  Component Value Date   CHOL 184 03/19/2013   HDL 50 03/19/2013   LDLCALC 110* 03/19/2013   LDLDIRECT 107* 10/02/2012   TRIG 118 03/19/2013   CHOLHDL 3.7 03/19/2013   Lab Results  Component Value Date   HGBA1C 7.2 03/11/2013   ASSESSMENT / PLAN: Woman here to establish new cardiology care the long-standing cardiac history but relatively stable over the last few years. Her pacemaker has not been evaluated since last AUGUST. She will need to be incorporated into the [employer].   Her blood pressure is relatively well controlled. She has gained about 9 pounds do to poor dietary habits and decreased overall exercise. Otherwise remains stable from cardiac standpoint. No heart failure symptoms or anginal pains, syncope or near syncope.  S/P placement of cardiac pacemaker Pacemaker seems to be working relatively well, but she seems to be pacing quite a bit. In the past she has had issues with reduced EF in the setting of pacemaker mediated wide complexes. Her EF remained normal by echocardiogram no comment on changing EF. With frequent pacing, she will need to be monitored in the device clinic. I will forward her to the [facility].  Diabetes mellitus type 2, uncontrolled, without complications On more medications. Followed by PCP. By last check was relatively well controlled.  MITRAL REGURGITATION - status post MVR No significant MR by last echo. AI was also  mild. Her mitral valve ring seems to be well seated. We can probably skip echo this year and recheck next year.  S/P MVR (mitral valve repair) Well-seated mitral valve ring. Mild MR only. No signs or symptoms of heart failure. Followup echo next year at roughly this time.  Chronic atrial fibrillation She is pretty much rate control with backup pacing. She is on beta blocker and calcium channel blocker. In the past, she had failed antiarrhythmics. 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   For now I would continue current regimen including Warfarin.  Hypertension associated with diabetes Well controlled today. On beta blocker, calcium channel blocker and ACE inhibitor. The addition of ACE inhibitor with a good choice in the setting  of diabetes. If her blood pressure shows signs of increasing, would increase ACE inhibitor first. To evaluate for additional cardiac risk assessment, we will check lipid panel with CMP    Orders Placed This Encounter  Procedures  . Comprehensive metabolic panel    Order Specific Question:  Has the patient fasted?    Answer:  Yes  . Lipid panel    Order Specific Question:  Has the patient fasted?    Answer:  Yes  . EKG 12-Lead   Meds ordered this encounter  Medications  . diltiazem (CARDIZEM CD) 180 MG 24 hr capsule    Sig: Take 1 capsule (180 mg total) by mouth daily.    Dispense:  30 capsule    Refill:  11  . atenolol (TENORMIN) 50 MG tablet    Sig: Take 1 tablet (50 mg total) by mouth daily.    Dispense:  30 tablet    Refill:  11  . lisinopril (PRINIVIL,ZESTRIL) 5 MG tablet    Sig: Take 1 tablet (5 mg total) by mouth daily.    Dispense:  30 tablet    Refill:  11   Followup: 1 year; but will need to be established in the [employer] for device interrogation.  Mahlon Gabrielle W. Ellyn Hack, M.D.,  M.S. Interventional Cardiolgy [facility]

## 2014-06-10 NOTE — Assessment & Plan Note (Signed)
On more medications. Followed by PCP. By last check was relatively well controlled.

## 2014-06-10 NOTE — Assessment & Plan Note (Signed)
Well-seated mitral valve ring. Mild MR only. No signs or symptoms of heart failure. Followup echo next year at roughly this time.

## 2014-06-10 NOTE — Assessment & Plan Note (Signed)
Pacemaker seems to be working relatively well, but she seems to be pacing quite a bit. In the past she has had issues with reduced EF in the setting of pacemaker mediated wide complexes. Her EF remained normal by echocardiogram no comment on changing EF. With frequent pacing, she will need to be monitored in the device clinic. I will forward her to the [facility].

## 2014-06-10 NOTE — Assessment & Plan Note (Signed)
She is pretty much rate control with backup pacing. She is on beta blocker and calcium channel blocker. In the past, she had failed antiarrhythmics. 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   For now I would continue current regimen including Warfarin.

## 2014-06-10 NOTE — Assessment & Plan Note (Deleted)
Annotation: Medtronic Adapta implanted January 2001 EKG 12/14/11: intermittently paced rhythm with pacer-induced left bundle, underlying atrial fibrillation.  PTVP for sick sinus syndrome Qualifier: Diagnosis of  By: Nori Riis MD, Havery Moros Change 11/2011: Medtronic Adapta L

## 2014-06-10 NOTE — Assessment & Plan Note (Signed)
No significant MR by last echo. AI was also mild. Her mitral valve ring seems to be well seated. We can probably skip echo this year and recheck next year.

## 2014-06-26 LAB — COMPREHENSIVE METABOLIC PANEL
ALBUMIN: 3.9 g/dL (ref 3.5–5.2)
ALT: 20 U/L (ref 0–35)
AST: 19 U/L (ref 0–37)
Alkaline Phosphatase: 104 U/L (ref 39–117)
BUN: 12 mg/dL (ref 6–23)
CHLORIDE: 103 meq/L (ref 96–112)
CO2: 28 mEq/L (ref 19–32)
CREATININE: 0.68 mg/dL (ref 0.50–1.10)
Calcium: 9.1 mg/dL (ref 8.4–10.5)
GLUCOSE: 161 mg/dL — AB (ref 70–99)
POTASSIUM: 3.9 meq/L (ref 3.5–5.3)
Sodium: 139 mEq/L (ref 135–145)
Total Bilirubin: 1.2 mg/dL (ref 0.2–1.2)
Total Protein: 7 g/dL (ref 6.0–8.3)

## 2014-06-26 LAB — LIPID PANEL
CHOLESTEROL: 182 mg/dL (ref 0–200)
HDL: 50 mg/dL (ref >39)
LDL Cholesterol: 110 mg/dL — ABNORMAL HIGH (ref 0–99)
Total CHOL/HDL Ratio: 3.6 Ratio
Triglycerides: 111 mg/dL (ref <150)
VLDL: 22 mg/dL (ref 0–40)

## 2014-07-02 ENCOUNTER — Telehealth: Payer: Self-pay | Admitting: *Deleted

## 2014-07-02 NOTE — Telephone Encounter (Signed)
Spoke to patient. Result given . Verbalized understanding  

## 2014-07-02 NOTE — Telephone Encounter (Signed)
-----   Message from Leonie Man, MD sent at 07/02/2014 12:00 AM EST ----- Essentially stable labs.  Leonie Man, MD

## 2014-09-07 ENCOUNTER — Other Ambulatory Visit: Payer: Self-pay | Admitting: Family Medicine

## 2014-09-07 DIAGNOSIS — Z7901 Long term (current) use of anticoagulants: Secondary | ICD-10-CM

## 2014-09-07 MED ORDER — WARFARIN SODIUM 5 MG PO TABS: ORAL_TABLET | ORAL | Status: DC

## 2014-09-07 NOTE — Telephone Encounter (Signed)
Refilled.    CGM

## 2014-09-07 NOTE — Telephone Encounter (Signed)
Will forward to MD for review. Oletta Lamas, CMA.

## 2014-09-07 NOTE — Telephone Encounter (Signed)
Krystal Little called about her Warfarin refill.  [facility] pharmacy sent a fax for refill 4 days ago.  Need to have rx sent to [facility] on [address].

## 2014-09-08 MED ORDER — WARFARIN SODIUM 5 MG PO TABS: ORAL_TABLET | ORAL | Status: DC

## 2014-09-08 NOTE — Telephone Encounter (Signed)
Received fax from [facility] for refill on Warfarin 5 mg.  Medication was filled by PCP on 09/07/2014, but stated fax.  Resent refill electronically. Derl Barrow, RN

## 2014-09-08 NOTE — Addendum Note (Signed)
Addended by: Derl Barrow on: 09/08/2014 11:02 AM   Modules accepted: Orders

## 2014-09-30 ENCOUNTER — Other Ambulatory Visit: Payer: Self-pay | Admitting: Family Medicine

## 2014-10-01 ENCOUNTER — Other Ambulatory Visit: Payer: Self-pay | Admitting: Family Medicine

## 2015-01-07 ENCOUNTER — Ambulatory Visit (INDEPENDENT_AMBULATORY_CARE_PROVIDER_SITE_OTHER): Payer: 59 | Admitting: *Deleted

## 2015-01-07 ENCOUNTER — Ambulatory Visit (INDEPENDENT_AMBULATORY_CARE_PROVIDER_SITE_OTHER): Payer: 59 | Admitting: Family Medicine

## 2015-01-07 ENCOUNTER — Encounter: Payer: Self-pay | Admitting: Family Medicine

## 2015-01-07 VITALS — BP 159/80 | HR 83 | Temp 98.0°F | Ht 64.0 in | Wt 168.5 lb

## 2015-01-07 DIAGNOSIS — E1159 Type 2 diabetes mellitus with other circulatory complications: Secondary | ICD-10-CM

## 2015-01-07 DIAGNOSIS — I482 Chronic atrial fibrillation, unspecified: Secondary | ICD-10-CM

## 2015-01-07 DIAGNOSIS — E1165 Type 2 diabetes mellitus with hyperglycemia: Secondary | ICD-10-CM

## 2015-01-07 DIAGNOSIS — E119 Type 2 diabetes mellitus without complications: Secondary | ICD-10-CM

## 2015-01-07 DIAGNOSIS — I10 Essential (primary) hypertension: Secondary | ICD-10-CM

## 2015-01-07 DIAGNOSIS — E1169 Type 2 diabetes mellitus with other specified complication: Secondary | ICD-10-CM | POA: Diagnosis not present

## 2015-01-07 DIAGNOSIS — Z7901 Long term (current) use of anticoagulants: Secondary | ICD-10-CM | POA: Diagnosis not present

## 2015-01-07 DIAGNOSIS — I152 Hypertension secondary to endocrine disorders: Secondary | ICD-10-CM

## 2015-01-07 DIAGNOSIS — IMO0002 Reserved for concepts with insufficient information to code with codable children: Secondary | ICD-10-CM

## 2015-01-07 LAB — POCT INR: INR: 2.2

## 2015-01-07 LAB — POCT GLYCOSYLATED HEMOGLOBIN (HGB A1C): HEMOGLOBIN A1C: 8.1

## 2015-01-07 NOTE — Patient Instructions (Signed)
DASH Eating Plan DASH stands for "Dietary Approaches to Stop Hypertension." The DASH eating plan is a healthy eating plan that has been shown to reduce high blood pressure (hypertension). Additional health benefits may include reducing the risk of type 2 diabetes mellitus, heart disease, and stroke. The DASH eating plan may also help with weight loss. WHAT DO I NEED TO KNOW ABOUT THE DASH EATING PLAN? For the DASH eating plan, you will follow these general guidelines:  Choose foods with a percent daily value for sodium of less than 5% (as listed on the food label).  Use salt-free seasonings or herbs instead of table salt or sea salt.  Check with your health care provider or pharmacist before using salt substitutes.  Eat lower-sodium products, often labeled as "lower sodium" or "no salt added."  Eat fresh foods.  Eat more vegetables, fruits, and low-fat dairy products.  Choose whole grains. Look for the word "whole" as the first word in the ingredient list.  Choose fish and skinless chicken or turkey more often than red meat. Limit fish, poultry, and meat to 6 oz (170 g) each day.  Limit sweets, desserts, sugars, and sugary drinks.  Choose heart-healthy fats.  Limit cheese to 1 oz (28 g) per day.  Eat more home-cooked food and less restaurant, buffet, and fast food.  Limit fried foods.  Cook foods using methods other than frying.  Limit canned vegetables. If you do use them, rinse them well to decrease the sodium.  When eating at a restaurant, ask that your food be prepared with less salt, or no salt if possible. WHAT FOODS CAN I EAT? Seek help from a dietitian for individual calorie needs. Grains Whole grain or whole wheat bread. Brown rice. Whole grain or whole wheat pasta. Quinoa, bulgur, and whole grain cereals. Low-sodium cereals. Corn or whole wheat flour tortillas. Whole grain cornbread. Whole grain crackers. Low-sodium crackers. Vegetables Fresh or frozen vegetables  (raw, steamed, roasted, or grilled). Low-sodium or reduced-sodium tomato and vegetable juices. Low-sodium or reduced-sodium tomato sauce and paste. Low-sodium or reduced-sodium canned vegetables.  Fruits All fresh, canned (in natural juice), or frozen fruits. Meat and Other Protein Products Ground beef (85% or leaner), grass-fed beef, or beef trimmed of fat. Skinless chicken or turkey. Ground chicken or turkey. Pork trimmed of fat. All fish and seafood. Eggs. Dried beans, peas, or lentils. Unsalted nuts and seeds. Unsalted canned beans. Dairy Low-fat dairy products, such as skim or 1% milk, 2% or reduced-fat cheeses, low-fat ricotta or cottage cheese, or plain low-fat yogurt. Low-sodium or reduced-sodium cheeses. Fats and Oils Tub margarines without trans fats. Light or reduced-fat mayonnaise and salad dressings (reduced sodium). Avocado. Safflower, olive, or canola oils. Natural peanut or almond butter. Other Unsalted popcorn and pretzels. The items listed above may not be a complete list of recommended foods or beverages. Contact your dietitian for more options. WHAT FOODS ARE NOT RECOMMENDED? Grains White bread. White pasta. White rice. Refined cornbread. Bagels and croissants. Crackers that contain trans fat. Vegetables Creamed or fried vegetables. Vegetables in a cheese sauce. Regular canned vegetables. Regular canned tomato sauce and paste. Regular tomato and vegetable juices. Fruits Dried fruits. Canned fruit in light or heavy syrup. Fruit juice. Meat and Other Protein Products Fatty cuts of meat. Ribs, chicken wings, bacon, sausage, bologna, salami, chitterlings, fatback, hot dogs, bratwurst, and packaged luncheon meats. Salted nuts and seeds. Canned beans with salt. Dairy Whole or 2% milk, cream, half-and-half, and cream cheese. Whole-fat or sweetened yogurt. Full-fat   cheeses or blue cheese. Nondairy creamers and whipped toppings. Processed cheese, cheese spreads, or cheese  curds. Condiments Onion and garlic salt, seasoned salt, table salt, and sea salt. Canned and packaged gravies. Worcestershire sauce. Tartar sauce. Barbecue sauce. Teriyaki sauce. Soy sauce, including reduced sodium. Steak sauce. Fish sauce. Oyster sauce. Cocktail sauce. Horseradish. Ketchup and mustard. Meat flavorings and tenderizers. Bouillon cubes. Hot sauce. Tabasco sauce. Marinades. Taco seasonings. Relishes. Fats and Oils Butter, stick margarine, lard, shortening, ghee, and bacon fat. Coconut, palm kernel, or palm oils. Regular salad dressings. Other Pickles and olives. Salted popcorn and pretzels. The items listed above may not be a complete list of foods and beverages to avoid. Contact your dietitian for more information. WHERE CAN I FIND MORE INFORMATION? National Heart, Lung, and Blood Institute: travelstabloid.com Document Released: 06/22/2011 Document Revised: 11/17/2013 Document Reviewed: 05/07/2013 [facility] Patient Information 2015 [place], [place]. This information is not intended to replace advice given to you by your health care provider. Make sure you discuss any questions you have with your health care provider.    We will check your A1C and INR today. I will call you with these results.   Get a home blood pressure cuff to monitor your blood pressure. If it is greater than 150/90 then come back before 2 months.   We will see you back in 2 months after you have implemented walking and dietary changes to discuss your blood pressure and see where you are.   We will get routine labs at that point.   We will also discuss the statin medication that we talked about.   Thanks for letting us take care of you!   Sincerely,  Paula Compton, MD Family Medicine - PGY 1

## 2015-01-12 ENCOUNTER — Other Ambulatory Visit: Payer: Self-pay | Admitting: *Deleted

## 2015-01-12 DIAGNOSIS — E119 Type 2 diabetes mellitus without complications: Secondary | ICD-10-CM

## 2015-01-12 MED ORDER — METFORMIN HCL ER 500 MG PO TB24: 500.0000 mg | ORAL_TABLET | Freq: Two times a day (BID) | ORAL | Status: DC

## 2015-01-12 NOTE — Telephone Encounter (Signed)
Refilled. CGM MD 

## 2015-01-15 NOTE — Progress Notes (Signed)
Patient ID: Krystal Little, female   DOB: 1955/07/31, 59 y.o.   MRN: [id number]   [facility] Aquilla Hacker, MD Phone: [phone]  Subjective:   # Yearly Follow Up  - Pt. With history of Chronic Anticoagulation on Coumadin. She has NOT been coming to get her INR checked. She has just been taking 5 mg daily. 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 She has had atrial fibrillation in the past. She has a cardiac pace maker. She has HTN and DMII.   # DMII  - Pt. On Metformin and Glimepiride.  - Last A1C was 7.2 in 2014.  - A1C here is 8.1.  - She has worked on diet and exercise. She is walking with her family.  - She eats at least every 4 hours each day.  - She has no vision changes, or changes in peripheral sensation.  - She has not had an eye exam in some time.   # HTN - No chest pain, palpitations, she is in chronic Afib.  - She has not had vision changes, or SOB.  - She does not check her BP at home.  - Here it is 151/80.  - She is working on low salt diet and lifestyle modification.  - She takes atenolol 50, Lisinopril 5 daily and Cardizem.   # Mitral repair / Afib - Pt. Chronically anticoagulated, but has not been for follow up INR check in over 1 year.  - Her INR today is 2.2.  - She has been taking 5 mg of coumadin daily.  - She is also on cardizem / atenolol for afib rate control  - She is followed by cardiology.    All relevant systems were reviewed and were negative unless otherwise noted in the HPI  Past Medical History Reviewed problem list.  Medications- reviewed and updated Current Outpatient Prescriptions  Medication Sig Dispense Refill  . atenolol (TENORMIN) 50 MG tablet Take 1 tablet (50 mg total) by mouth daily. 30 tablet 11  . Blood Glucose Monitoring Suppl (ONE TOUCH ULTRA SYSTEM KIT) W/DEVICE KIT 1 kit by Does not apply route once. Check blood sugar fasting  and throughout the day, 3-4 x/ week. Goal fasting < 130 Goal mid-day/non-fasting < 200 1 each 0  . diltiazem (CARDIZEM CD) 180 MG 24 hr capsule Take 1 capsule (180 mg total) by mouth daily. 30 capsule 11  . fluticasone (FLONASE) 50 MCG/ACT nasal spray Place 2 sprays into both nostrils daily. (Patient taking differently: Place 2 sprays into both nostrils as needed. ) 16 g 6  . glimepiride (AMARYL) 2 MG tablet TAKE ONE TABLET BY MOUTH ONCE DAILY BEFORE BREAKFAST 90 tablet 0  . lisinopril (PRINIVIL,ZESTRIL) 5 MG tablet Take 1 tablet (5 mg total) by mouth daily. 30 tablet 11  . metFORMIN (GLUCOPHAGE-XR) 500 MG 24 hr tablet Take 1 tablet (500 mg total) by mouth 2 (two) times daily with a meal. 180 tablet 5  . warfarin (COUMADIN) 5 MG tablet TAKE ONE TABLET BY MOUTH AS  DIRECTED 90 tablet 2   No current facility-administered medications for this visit.   Chief complaint-noted No additions to family history Social history- patient is a non smoker  Objective: BP 159/80 mmHg  Pulse 83  Temp(Src) 98 F (36.7 C) (Oral)  Ht _0  (1.626 m)  Wt 168 lb 8 oz (76.431 kg)  BMI 28.91 kg/m2 Gen: NAD, alert,  cooperative with exam HEENT: NCAT, EOMI, PERRL, TMs nml Neck: FROM, supple CV: Regularly irregular, good S1/S2, no murmur Resp: CTABL, no wheezes, non-labored Abd: SNTND, BS present, no guarding or organomegaly Ext: No edema, warm, normal tone, moves UE/LE spontaneously Neuro: Alert and oriented, No gross deficits Skin: no rashes no lesions  Assessment/Plan: See problem based a/p

## 2015-01-15 NOTE — Assessment & Plan Note (Signed)
Pt. With chronic afib. Rate controlled with Atenolol and Cardizem. Followed by cardiology. She is anticoagulated on coumadin also for mitral valve repair. She has been taking 5 mg daily without INR check in > 1 year. INR today is 2.2   - Needs follow up for INR. This was reiterated to her.  - Will have her come back in 2 months for this.  - Continue 5mg  daily for now.  - Continue atenolol and Cardizem. Rate controlled at this point. Doing well.

## 2015-01-15 NOTE — Assessment & Plan Note (Signed)
Pt. With marginally high BP. She says she can't remember if she took her medication this am or not. She does not have a BP monitor at home.   - Hold off in increasing medication just yet.  - Working on low sodium diet / lifestyle modification.  - Will have her come back for HTN check in 1 week.  - Will increase lisinopril at that time if still high.  - Will have her follow up in 2 months.

## 2015-01-15 NOTE — Assessment & Plan Note (Signed)
She is on metformin and Glimeperide. Her A1C today is 8 from 7.2 previously. She is doing better, but needs to be < 8. No end organ damage at this point. No loss of sensation.   - Continue current regimen.  - Follow up A1C in 3 months.  - Needs her kidney function and liver enzymes checked.  - She is tolerating her medication well.

## 2015-02-08 ENCOUNTER — Other Ambulatory Visit: Payer: Self-pay | Admitting: Family Medicine

## 2015-02-08 NOTE — Telephone Encounter (Signed)
Patient is calling because she asked [facility] at [facility] to send a request last week for a refill for glimepiride (AMARYL) 2 MG tablet; however, she hasn't received it. She would like a refill on this at the earliest convenience. Thank you, Fonda Kinder, ASA

## 2015-02-09 MED ORDER — GLIMEPIRIDE 2 MG PO TABS: ORAL_TABLET | ORAL | Status: DC

## 2015-03-04 ENCOUNTER — Ambulatory Visit: Payer: 59

## 2015-03-28 ENCOUNTER — Other Ambulatory Visit: Payer: Self-pay | Admitting: Family Medicine

## 2015-03-29 ENCOUNTER — Ambulatory Visit: Payer: 59

## 2015-04-01 ENCOUNTER — Ambulatory Visit (INDEPENDENT_AMBULATORY_CARE_PROVIDER_SITE_OTHER): Payer: 59 | Admitting: *Deleted

## 2015-04-01 DIAGNOSIS — I482 Chronic atrial fibrillation, unspecified: Secondary | ICD-10-CM

## 2015-04-01 DIAGNOSIS — Z23 Encounter for immunization: Secondary | ICD-10-CM | POA: Diagnosis not present

## 2015-04-01 LAB — POCT INR: INR: 2.3

## 2015-06-01 ENCOUNTER — Encounter: Payer: Self-pay | Admitting: Cardiology

## 2015-06-01 ENCOUNTER — Ambulatory Visit (INDEPENDENT_AMBULATORY_CARE_PROVIDER_SITE_OTHER): Payer: 59 | Admitting: Cardiology

## 2015-06-01 ENCOUNTER — Encounter: Payer: 59 | Admitting: Cardiology

## 2015-06-01 VITALS — BP 130/74 | HR 66 | Ht 64.0 in | Wt 162.8 lb

## 2015-06-01 DIAGNOSIS — I080 Rheumatic disorders of both mitral and aortic valves: Secondary | ICD-10-CM

## 2015-06-01 DIAGNOSIS — E1159 Type 2 diabetes mellitus with other circulatory complications: Secondary | ICD-10-CM | POA: Diagnosis not present

## 2015-06-01 DIAGNOSIS — I152 Hypertension secondary to endocrine disorders: Secondary | ICD-10-CM

## 2015-06-01 DIAGNOSIS — Z9889 Other specified postprocedural states: Secondary | ICD-10-CM | POA: Diagnosis not present

## 2015-06-01 DIAGNOSIS — Z950 Presence of cardiac pacemaker: Secondary | ICD-10-CM | POA: Diagnosis not present

## 2015-06-01 DIAGNOSIS — Z7901 Long term (current) use of anticoagulants: Secondary | ICD-10-CM

## 2015-06-01 DIAGNOSIS — I482 Chronic atrial fibrillation, unspecified: Secondary | ICD-10-CM

## 2015-06-01 DIAGNOSIS — I10 Essential (primary) hypertension: Secondary | ICD-10-CM

## 2015-06-01 NOTE — Progress Notes (Deleted)
PCP: Paula Compton, MD  Clinic Note: No chief complaint on file.   HPI: Krystal Little is a 59 y.o. 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  She had a Long-standing cardiac history with rheumatic heart disease the child that subsequently that to moderate-severe mitral regurgitation with prolapse. In late December 2000, she presented with acute syncope with sick sinus syndrome. She underwent mitral valve repair with ring annuloplasty and transposition of chordae by Dr. Cyndia Bent, followed by pacemaker placement by Dr. Rollene Fare. She has had chronic atrial fibrillation as well as atrial flutter with attempted ablation of complex atrial flutter at [facility]. She has tried and failed antiarrhythmics including sotalol and Tikosyn.  Krystal Little was last seen on 06/09/2015 (@ the [facility]) without any major complaints. Did not wgt gain.  Recent Hospitalizations: ***  Studies Reviewed: None  Interval History: ***   No chest pain or shortness of breath with rest or exertion. No PND, orthopnea or edema. No palpitations, lightheadedness, dizziness, weakness or syncope/near syncope. No TIA/amaurosis fugax symptoms. No melena, hematochezia, hematuria, or epstaxis. No claudication.  ROS: A comprehensive was performed. ROS   Past Medical History  Diagnosis Date  . 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 Chronic atrial fibrillation 1990s    s/p DCCV then attempted ablation of complex A Flutter (@ [facility] - Dr. Deno Etienne), failed antiarrhythmics --> INR folllwed @ [facility]  . Hx of sick sinus syndrome  07/1999    Wtih symptomatic bradycardia - syncope (Tachy-Brady)  . 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 History of cardiac catheterization 2001    R&LHC - normal Coronaries, no evidence of Restictive Cardiomyopathy or Constrictive Pericarditis (also @ [facility])  . Diabetes mellitus type 2, uncontrolled, without complications     On Oral Medications ([facility])  . Essential hypertension     Past Surgical History  Procedure Laterality Date  . Mitral valve repair  07/1999    Both Ant& Post leaflet repair - quadrangular resection&caudal transposition, #32 Sequin Annuloplasty ring  . Transthoracic echocardiogram  03/21/2013  . Cardiac catheterization  08/03/1999    [facility] - normal Coronaries; no sign of constriction or restrictive cardiomypathy  . Pacemaker insertion  07/1999    for SSS in setting of Chronic Afib; Medtronic Kappa [id number], [id number] H.  . Pacemaker generator change  11/26/2008    Medtronic Adapta L(? if it has been checked since 02/2013)   Prior to Admission medications   Medication Sig Start Date End Date Taking? Authorizing Provider  atenolol (TENORMIN) 50 MG tablet Take 1 tablet (50 mg total) by mouth daily. 06/08/14   Leonie Man, MD  Blood Glucose Monitoring Suppl (ONE TOUCH ULTRA SYSTEM KIT) W/DEVICE KIT 1 kit by Does not apply route once. Check blood sugar fasting and throughout the day, 3-4 x/ week. Goal fasting < 130 Goal mid-day/non-fasting < 200 09/14/10 09/14/10  Josalyn Funches, MD  diltiazem (CARDIZEM CD) 180 MG 24 hr capsule Take 1 capsule (180 mg total) by mouth daily. 06/08/14   Leonie Man, MD  fluticasone [facility]) 50 MCG/ACT  nasal spray Place 2 sprays into both nostrils daily. Patient taking differently: Place 2 sprays into both nostrils as needed.  12/22/13   Angelica Ran, MD  glimepiride (AMARYL) 2 MG tablet TAKE ONE TABLET BY MOUTH ONCE DAILY BEFORE BREAKFAST 03/30/15   Aquilla Hacker, MD  lisinopril (PRINIVIL,ZESTRIL) 5 MG tablet Take 1  tablet (5 mg total) by mouth daily. 06/08/14   Leonie Man, MD  metFORMIN (GLUCOPHAGE-XR) 500 MG 24 hr tablet Take 1 tablet (500 mg total) by mouth 2 (two) times daily with a meal. 01/12/15   Aquilla Hacker, MD  warfarin (COUMADIN) 5 MG tablet TAKE ONE TABLET BY MOUTH AS  DIRECTED 09/08/14   Aquilla Hacker, MD   No Known Allergies  Social History   Social History  . Marital Status: Married    Spouse Name: Krystal Little   . Number of Children: 2  . Years of Education: 16 years.   Occupational History  . 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 Smoking status: Never Smoker   . Smokeless tobacco: Not on file  . Alcohol Use: Yes     Comment: occasional, once every month or two- glass of wine   . Drug Use: No  . Sexual Activity:    Partners: Male   Other Topics Concern  . Not on file   Social History Narrative   Lives with husband, great-niece (4)  and great-nephew (54).    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   1 son died in an automobile accident @ age 40 (2010).    She raised several nieces & nephews - 1 nephew lives with her.   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   Family History  Problem Relation Age of Onset  . Hypertension Mother   . Diabetes Sister   . Lung cancer Brother      Wt Readings from Last 3 Encounters:  01/07/15 168 lb 8 oz (76.431 kg)  06/08/14 162 lb 12.8 oz (73.846 kg)  12/22/13 153 lb (69.4 kg)    PHYSICAL EXAM There were no vitals taken for this visit. General appearance: alert, cooperative, appears stated age, no distress and otherwise healthy appearing  Neck: no adenopathy, no carotid bruit and no JVD with mild cannon A-waves Lungs: clear to auscultation bilaterally, normal percussion bilaterally and non-labored Heart: Irreg/Irreg, S1, S2 normal, *** murmur, click,  rub or gallop *** Abdomen: soft, non-tender; bowel sounds normal; no masses,  no organomegaly; Extremities: extremities normal, atraumatic, no cyanosis, and edema Pulses: 2+ and symmetric;  Skin: normal  Neurologic: Mental status: Alert, oriented, thought content appropriate Cranial nerves: normal (II-XII grossly intact)    Adult ECG Report  Rate: *** ;  Rhythm: {rhythm:17366};   Narrative Interpretation: ***  Other studies Reviewed: Additional studies/ records that were reviewed today include:  Recent Labs:   Lab Results  Component Value Date   CHOL 182 06/25/2014   HDL 50 06/25/2014   LDLCALC 110* 06/25/2014   LDLDIRECT 107* 10/02/2012   TRIG 111 06/25/2014   CHOLHDL 3.6 06/25/2014   Lab Results  Component Value Date   CREATININE 0.68 06/25/2014    ASSESSMENT / PLAN: Problem List Items Addressed This Visit    None      Current medicines are reviewed at length with the patient  today. (+/- concerns) *** The following changes have been made: *** Studies Ordered:   No orders of the defined types were placed in this encounter.      Leonie Man, M.D., M.S. Interventional Cardiologist   Pager # [phone]    s/P placement of cardiac pacemaker Pacemaker seems to be working relatively well, but she seems to be pacing quite a bit. In the past she has had issues with reduced EF in the setting of pacemaker mediated wide complexes. Her EF remained normal by echocardiogram no comment on changing EF. With frequent pacing, she will need to be monitored in the device clinic. I will forward her to the [facility].  Diabetes mellitus type 2, uncontrolled, without complications On more medications. Followed by PCP. By last check was relatively well controlled.  MITRAL REGURGITATION - status post MVR No significant MR by last echo. AI was also mild. Her mitral valve ring seems to be well seated. We can probably skip echo this year and  recheck next year.  S/P MVR (mitral valve repair) Well-seated mitral valve ring. Mild MR only. No signs or symptoms of heart failure. Followup echo next year at roughly this time.  Chronic atrial fibrillation She is pretty much rate control with backup pacing. She is on beta blocker and calcium channel blocker. In the past, she had failed antiarrhythmics. 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  For now I would continue current regimen including Warfarin.  Hypertension associated with diabetes Well controlled today. On beta blocker, calcium channel blocker and ACE inhibitor. The addition of ACE inhibitor with a good choice in the setting of diabetes. If her blood pressure shows signs of increasing, would increase ACE inhibitor first. To evaluate for additional cardiac risk assessment, we will check lipid panel with CMP

## 2015-06-01 NOTE — Assessment & Plan Note (Signed)
Plan is to check 2-D echocardiogram this year

## 2015-06-01 NOTE — Assessment & Plan Note (Signed)
On warfarin. No bleeding issues.

## 2015-06-01 NOTE — Assessment & Plan Note (Signed)
She needs to followup in the pacemaker clinic. Will establish this followup for her.

## 2015-06-01 NOTE — Assessment & Plan Note (Signed)
Last echocardiogram was in 2014.  Plan was to check every 2 years. No significant, on exam and no signs of heart failure.  Plan: Check 2-D echocardiogram this year and if stable, would check every 2 years

## 2015-06-01 NOTE — Patient Instructions (Signed)
Your physician wants you to follow-up in [place].  You will receive a reminder letter in the mail two months in advance. If you don't receive a letter, please call our office to schedule the follow-up appointment.  Your physician has requested that you have an echocardiogram. Echocardiography is a painless test that uses sound waves to create images of your heart. It provides your doctor with information about the size and shape of your heart and how well your heart's chambers and valves are working. This procedure takes approximately one hour. There are no restrictions for this procedure.  Your physician recommends that you schedule a follow-up appointment [facility]  If you need a refill on your cardiac medications before your next appointment, please call your pharmacy.

## 2015-06-01 NOTE — Progress Notes (Signed)
PCP: Paula Compton, MD  Clinic Note: Chief Complaint  Patient presents with  . Annual Exam    pt denied chest pain and SOB  . Edema    pt c/o swelling in ankles  . Cardiac Valve Problem    Status post MVR  . Atrial Fibrillation    HPI: Krystal Little is a 59 y.o. 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  She had a Long-standing cardiac history with rheumatic heart disease the child that subsequently that to moderate-severe mitral regurgitation with prolapse. In late December 2000, she presented with acute syncope with sick sinus syndrome. She underwent mitral valve repair with ring annuloplasty and transposition of chordae by Dr. Cyndia Bent, followed by pacemaker placement by Dr. Rollene Fare. She has had chronic atrial fibrillation as well as atrial flutter with attempted ablation of complex atrial flutter at [facility]. She has tried and failed antiarrhythmics including sotalol and Tikosyn.  Krystal Little was last seen on 06/09/2015 without any major complaints. Did not wgt gain.  Recent Hospitalizations: none  Studies Reviewed: None  Interval History: Marbeth presents today without any complaints.  She has no local symptoms from her atrial fibrillation. No rapid rates. She is not sure whether or not she is paced. No resting or exertional dyspnea. No PND, orthopnea or edema.mild pedal edema which is on her feet for several hours in a row.  No chest pain or shortness of breath with rest or exertion. No palpitations, lightheadedness, dizziness, weakness or syncope/near syncope. No TIA/amaurosis fugax symptoms. No melena, hematochezia, hematuria, or epstaxis. No claudication.  ROS: A comprehensive was performed. Review of Systems  Constitutional: Negative for malaise/fatigue (sometimes feels tired - after taking smoe meds.).  HENT: Negative for nosebleeds.   Cardiovascular: Positive for leg swelling (only if on her  feet all day - mild). Negative for chest pain, palpitations, orthopnea, claudication and PND.  Gastrointestinal: Negative for blood in stool and melena.  Genitourinary: Negative for hematuria.  Neurological: Negative for dizziness, sensory change, speech change, focal weakness, loss of consciousness and weakness.  All other systems reviewed and are negative.   Past Medical History  Diagnosis Date  . 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 Chronic atrial fibrillation ([place]) 1990s    s/p DCCV then attempted ablation of complex A Flutter (@ [facility] - Dr. Deno Etienne), failed antiarrhythmics --> INR folllwed @ [facility]  . Hx of sick sinus syndrome 07/1999    Wtih symptomatic bradycardia - syncope (Tachy-Brady)  . 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 History of cardiac catheterization 2001    R&LHC - normal Coronaries, no evidence of Restictive Cardiomyopathy or Constrictive Pericarditis (also @ [facility])  . Diabetes mellitus type 2, uncontrolled, without complications (HCC)     On Oral Medications ([facility])  . Essential hypertension     Past Surgical History  Procedure Laterality Date  . Mitral valve repair  07/1999    Both Ant& Post leaflet repair - quadrangular resection&caudal transposition, #32 Sequin Annuloplasty ring  . Transthoracic echocardiogram  03/21/2013  . Cardiac catheterization  08/03/1999  [place] - normal Coronaries; no sign of constriction or restrictive cardiomypathy  . Pacemaker insertion  07/1999    for SSS in setting of Chronic Afib; Medtronic Kappa [id number], [id number] H.  . Pacemaker generator change  11/26/2008    Medtronic Adapta L(? if it has been  checked since 02/2013)   Prior to Admission medications   Medication Sig Start Date End Date Taking? Authorizing Provider  atenolol (TENORMIN) 50 MG tablet Take 1 tablet (50 mg total) by mouth daily. 06/08/14   Leonie Man, MD  Blood Glucose Monitoring Suppl (ONE TOUCH ULTRA SYSTEM KIT) W/DEVICE KIT 1 kit by Does not apply route once. Check blood sugar fasting and throughout the day, 3-4 x/ week. Goal fasting < 130 Goal mid-day/non-fasting < 200 09/14/10 09/14/10  Josalyn Funches, MD  diltiazem (CARDIZEM CD) 180 MG 24 hr capsule Take 1 capsule (180 mg total) by mouth daily. 06/08/14   Leonie Man, MD  fluticasone [facility]) 50 MCG/ACT nasal spray Place 2 sprays into both nostrils daily. Patient taking differently: Place 2 sprays into both nostrils as needed.  12/22/13   Angelica Ran, MD  glimepiride (AMARYL) 2 MG tablet TAKE ONE TABLET BY MOUTH ONCE DAILY BEFORE BREAKFAST 03/30/15   Aquilla Hacker, MD  lisinopril (PRINIVIL,ZESTRIL) 5 MG tablet Take 1 tablet (5 mg total) by mouth daily. 06/08/14   Leonie Man, MD  metFORMIN (GLUCOPHAGE-XR) 500 MG 24 hr tablet Take 1 tablet (500 mg total) by mouth 2 (two) times daily with a meal. 01/12/15   Aquilla Hacker, MD  warfarin (COUMADIN) 5 MG tablet TAKE ONE TABLET BY MOUTH AS  DIRECTED 09/08/14   Aquilla Hacker, MD   No Known Allergies  Social History   Social History  . Marital Status: Married    Spouse Name: Riki Rusk   . Number of Children: 2  . Years of Education: 16 years.   Occupational History  . 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 Smoking status: Never Smoker   . Smokeless tobacco: None  . Alcohol Use: Yes     Comment: occasional, once every month or two- glass of wine   . Drug Use: No  . Sexual Activity:    Partners: Male   Other Topics Concern  . None   Social History Narrative   Lives with husband, great-niece (97)   and great-nephew (75).    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   1 son died in an automobile accident @ age 18 (2010).    She raised several nieces & nephews - 1 nephew lives with her.   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   Family History  Problem Relation Age of Onset  . Hypertension Mother   . Diabetes Sister   . Lung cancer Brother     Wt Readings from Last 3 Encounters:  06/01/15 162 lb 12.8 oz (73.846 kg)  01/07/15 168 lb 8 oz (76.431 kg)  06/08/14 162 lb 12.8 oz (73.846 kg)    PHYSICAL EXAM BP 130/74 mmHg  Pulse 66  Ht '5\' 4"'  (1.626 m)  Wt 162 lb 12.8 oz (73.846 kg)  BMI 27.93 kg/m2 General appearance: alert, cooperative, appears stated age, no distress and otherwise healthy appearing  Neck: no adenopathy, no carotid bruit and no JVD with mild cannon  A-waves Lungs: clear to auscultation bilaterally, normal percussion bilaterally and non-labored Heart: Irreg/Irreg, S1, S2 normal (occasionally split S2 & occasional ectopy), No murmur, click, rub or gallop; non-displaced PMI  Abdomen: soft, non-tender; bowel sounds normal; no masses,  no organomegaly; Extremities: extremities normal, atraumatic, no cyanosis, and edema Pulses: 2+ and symmetric;  Skin: normal  Neurologic: Mental status: Alert, oriented, thought content appropriate Cranial nerves: normal (II-XII grossly intact)    Adult ECG Report  Rate: 66 ;  Rhythm: atrial fibrillation and With frequent demand Ventricular pacing; native beats have T-wave inversions in lead V3 T3 as well as V4 through V6. Cannot exclude ischemia.; otherwise normal axis and intervals.  Narrative Interpretation: stable EKG with no significant change from November 2015.  Other studies Reviewed: Additional studies/ records that were reviewed today include:  Recent Labs:   Lab Results  Component Value Date   CHOL 182 06/25/2014   HDL 50 06/25/2014    LDLCALC 110* 06/25/2014   LDLDIRECT 107* 10/02/2012   TRIG 111 06/25/2014   CHOLHDL 3.6 06/25/2014   Lab Results  Component Value Date   CREATININE 0.68 06/25/2014    ASSESSMENT / PLAN: Problem List Items Addressed This Visit    S/P placement of cardiac pacemaker (Chronic)    She needs to followup in the pacemaker clinic. Will establish this followup for her.      S/P MVR (mitral valve repair) (Chronic)    Last echocardiogram was in 2014.  Plan was to check every 2 years. No significant, on exam and no signs of heart failure.  Plan: Check 2-D echocardiogram this year and if stable, would check every 2 years      Relevant Orders   EKG 12-Lead   ECHOCARDIOGRAM COMPLETE   MITRAL REGURGITATION - status post MVR (Chronic)    Plan is to check 2-D echocardiogram this year      Relevant Orders   EKG 12-Lead   ECHOCARDIOGRAM COMPLETE   Long term (current) use of anticoagulants (Chronic)    On warfarin. No bleeding issues.      Hypertension associated with diabetes ([place]) (Chronic)    Well-controlled blood pressure now. With her having some dizziness and fatigue, if it's okay to occasionally hold lisinopril.  Would prefer to be on 5 mg, but if necessary can reduce to 2.5 mg of symptoms She is already on diltiazem and atenolol for rate control. -- If more blood pressure control is required and lisinopril was chosen, would potentially reduce diltiazem or atenolol dose to      Relevant Orders   EKG 12-Lead   ECHOCARDIOGRAM COMPLETE   Chronic atrial fibrillation (HCC) - Primary (Chronic)    Rate-controlled A. Fib with atenolol and diltiazem. Probably could reduce dose if needed. On warfarin for anticoagulation for both A. Fib and mitral valve. INR is being followed up with PCP.      Relevant Orders   EKG 12-Lead   ECHOCARDIOGRAM COMPLETE      Current medicines are reviewed at length with the patient today. (+/- concerns) concerned about feeling tired after taking  lisinopril. The following changes have been made: okay to occasionally hold lisinopril if feeling fatigued or tired or dizzy. If she holds it somewhat with her, with the reduced dose of 2.5 mg.   Studies Ordered:   Orders Placed This Encounter  Procedures  . EKG 12-Lead  . ECHOCARDIOGRAM COMPLETE     HARDING, Leonie Green, M.D., M.S. Interventional Cardiologist   Pager # [phone]

## 2015-06-01 NOTE — Assessment & Plan Note (Signed)
Rate-controlled A. Fib with atenolol and diltiazem. Probably could reduce dose if needed. On warfarin for anticoagulation for both A. Fib and mitral valve. INR is being followed up with PCP.

## 2015-06-01 NOTE — Assessment & Plan Note (Signed)
Well-controlled blood pressure now. With her having some dizziness and fatigue, if it's okay to occasionally hold lisinopril.  Would prefer to be on 5 mg, but if necessary can reduce to 2.5 mg of symptoms She is already on diltiazem and atenolol for rate control. -- If more blood pressure control is required and lisinopril was chosen, would potentially reduce diltiazem or atenolol dose to

## 2015-06-02 DIAGNOSIS — I5189 Other ill-defined heart diseases: Secondary | ICD-10-CM | POA: Insufficient documentation

## 2015-06-03 ENCOUNTER — Ambulatory Visit (INDEPENDENT_AMBULATORY_CARE_PROVIDER_SITE_OTHER): Payer: 59 | Admitting: *Deleted

## 2015-06-03 ENCOUNTER — Telehealth: Payer: Self-pay | Admitting: *Deleted

## 2015-06-03 DIAGNOSIS — Z950 Presence of cardiac pacemaker: Secondary | ICD-10-CM

## 2015-06-03 DIAGNOSIS — I482 Chronic atrial fibrillation, unspecified: Secondary | ICD-10-CM

## 2015-06-03 LAB — CUP PACEART INCLINIC DEVICE CHECK
Battery Impedance: 372 Ohm
Battery Voltage: 2.8 V
Brady Statistic RV Percent Paced: 57.1 %
Implantable Lead Implant Date: 20100513
Implantable Lead Implant Date: 20100513
Implantable Lead Location: 753859
Lead Channel Setting Pacing Pulse Width: 0.4 ms
Lead Channel Setting Sensing Sensitivity: 5.6 mV
MDC IDC LEAD LOCATION: 753860
MDC IDC MSMT BATTERY REMAINING LONGEVITY: 102 mo
MDC IDC MSMT LEADCHNL RV PACING THRESHOLD AMPLITUDE: 1.75 V
MDC IDC MSMT LEADCHNL RV PACING THRESHOLD PULSEWIDTH: 0.4 ms
MDC IDC MSMT LEADCHNL RV SENSING INTR AMPL: 22.4 mV
MDC IDC SESS DTM: 20161117170334
MDC IDC SET LEADCHNL RV PACING AMPLITUDE: 2.75 V

## 2015-06-03 NOTE — Progress Notes (Signed)
This encounter was created in error - please disregard.

## 2015-06-03 NOTE — Telephone Encounter (Signed)
Spoke with Ivin Booty, RN, who clarified that Dr. Ellyn Hack would like patient to follow-up with an EP for ongoing PPM management.  Will explain to patient this afternoon at appointment.

## 2015-06-03 NOTE — Progress Notes (Signed)
Pacemaker check in clinic. Normal device function. Thresholds, sensing, impedances consistent with previous measurements. Device programmed to maximize longevity. 48 high ventricular rates noted--all available EGMs suggest AF w/RVR, +warfarin, longest 39sec, peak V 202bpm. Device programmed at appropriate safety margins. Histogram distribution appropriate for patient activity level. Device programmed to optimize intrinsic conduction. Estimated longevity 8.5 years. Patient education completed. ROV with WC on 06/30/15 at 3:45pm.

## 2015-06-03 NOTE — Telephone Encounter (Signed)
Called patient to discuss PPM follow-up.  Explained that as patient has not followed-up with an EP in over 2 years that it would be appropriate for to be seen by an EP, rather than the device clinic today.  Offered to schedule patient for appointment with Dr. Curt Bears this afternoon, but patient states that Dr. Ellyn Hack follows all of her cardiology needs and that she is not interested in seeing "another doctor".  She states "I've never needed one before, why should I start now?"  Patient is still agreeable to coming in to the device clinic for her appointment at 4:30pm to have her PPM checked.  Advised patient that I will clarify with Dr. Ellyn Hack if she should schedule a follow-up appointment with an EP.  Patient denies additional questions or concerns at this time.

## 2015-06-03 NOTE — Telephone Encounter (Signed)
Spoke to [employer] in agreement with Raquel Sarna. Patient should follow up with electrophysiologist . Dr Ellyn Hack does not follow device. Raquel Sarna states she will inform patient and if patient is not agreeable RN will call patient.

## 2015-06-03 NOTE — Telephone Encounter (Signed)
She just needs Device check - if EP MD needs to see just to identify face with name - that is probably a good idea.  Krystal Man, MD

## 2015-06-03 NOTE — Telephone Encounter (Signed)
FORWARD TO EMILY FOR REVIEW

## 2015-06-03 NOTE — Telephone Encounter (Addendum)
After in-office discussion with patient, she was agreeable to following-up with Dr. Curt Bears to see for initial visit.  Will plan to keep initial appointment and establish remote monitoring schedule at this visit.

## 2015-06-07 ENCOUNTER — Ambulatory Visit (HOSPITAL_COMMUNITY): Payer: 59

## 2015-06-08 ENCOUNTER — Other Ambulatory Visit: Payer: Self-pay | Admitting: Family Medicine

## 2015-06-08 ENCOUNTER — Other Ambulatory Visit: Payer: Self-pay | Admitting: Cardiology

## 2015-06-08 DIAGNOSIS — Z7901 Long term (current) use of anticoagulants: Secondary | ICD-10-CM

## 2015-06-08 MED ORDER — DILTIAZEM HCL ER COATED BEADS 180 MG PO CP24: 180.0000 mg | ORAL_CAPSULE | Freq: Every day | ORAL | Status: DC

## 2015-06-08 NOTE — Telephone Encounter (Signed)
°*  STAT* If patient is at the pharmacy, call can be transferred to refill team.   1. Which medications need to be refilled? (please list name of each medication and dose if known) Diltiazem-please call today if possible 2. Which pharmacy/location (including street and city if local pharmacy) is medication to be sent to?[phone]  3. Do they need a 30 day or 90 day supply? 30 and refills 30 and refills

## 2015-06-08 NOTE — Telephone Encounter (Signed)
Need warfarin [facility]ill [facility] [place]

## 2015-06-08 NOTE — Telephone Encounter (Signed)
Pt's Rx was sent to pt's pharmacy as requested. Confirmation received.  °

## 2015-06-09 MED ORDER — WARFARIN SODIUM 5 MG PO TABS: ORAL_TABLET | ORAL | Status: DC

## 2015-06-15 ENCOUNTER — Ambulatory Visit (HOSPITAL_COMMUNITY): Payer: 59 | Attending: Cardiovascular Disease

## 2015-06-15 ENCOUNTER — Other Ambulatory Visit: Payer: Self-pay

## 2015-06-15 DIAGNOSIS — I517 Cardiomegaly: Secondary | ICD-10-CM | POA: Insufficient documentation

## 2015-06-15 DIAGNOSIS — E119 Type 2 diabetes mellitus without complications: Secondary | ICD-10-CM | POA: Insufficient documentation

## 2015-06-15 DIAGNOSIS — I351 Nonrheumatic aortic (valve) insufficiency: Secondary | ICD-10-CM | POA: Diagnosis not present

## 2015-06-15 DIAGNOSIS — I482 Chronic atrial fibrillation, unspecified: Secondary | ICD-10-CM

## 2015-06-15 DIAGNOSIS — I071 Rheumatic tricuspid insufficiency: Secondary | ICD-10-CM | POA: Insufficient documentation

## 2015-06-15 DIAGNOSIS — I10 Essential (primary) hypertension: Secondary | ICD-10-CM

## 2015-06-15 DIAGNOSIS — I059 Rheumatic mitral valve disease, unspecified: Secondary | ICD-10-CM | POA: Diagnosis not present

## 2015-06-15 DIAGNOSIS — I152 Hypertension secondary to endocrine disorders: Secondary | ICD-10-CM

## 2015-06-15 DIAGNOSIS — Z9889 Other specified postprocedural states: Secondary | ICD-10-CM

## 2015-06-15 DIAGNOSIS — I340 Nonrheumatic mitral (valve) insufficiency: Secondary | ICD-10-CM | POA: Insufficient documentation

## 2015-06-15 DIAGNOSIS — I080 Rheumatic disorders of both mitral and aortic valves: Secondary | ICD-10-CM

## 2015-06-15 DIAGNOSIS — I358 Other nonrheumatic aortic valve disorders: Secondary | ICD-10-CM | POA: Insufficient documentation

## 2015-06-15 DIAGNOSIS — E1159 Type 2 diabetes mellitus with other circulatory complications: Secondary | ICD-10-CM

## 2015-06-18 ENCOUNTER — Telehealth: Payer: Self-pay | Admitting: *Deleted

## 2015-06-18 DIAGNOSIS — Z9889 Other specified postprocedural states: Secondary | ICD-10-CM

## 2015-06-18 DIAGNOSIS — I080 Rheumatic disorders of both mitral and aortic valves: Secondary | ICD-10-CM

## 2015-06-18 NOTE — Telephone Encounter (Signed)
-----   Message from Leonie Man, MD sent at 06/15/2015 12:25 PM EST ----- Echo shows normal pump function, but worsening relaxation abnormality - this could lead to exertional dyspnea, PND & orthopnea Sx.   Will need to monitor diuretic dosing & adjust based upon Sx.  Valve ring looks ok - but mild restriction of leaflet motility -- post MVR changes, mild Mild stenosis - slightly progressed in 2 yrs.  May want want to recheck next year instead of in 2 yrs.  Leonie Man, MD

## 2015-06-18 NOTE — Telephone Encounter (Signed)
Left message to call back  

## 2015-06-21 NOTE — Telephone Encounter (Signed)
Spoke to patient. Result given . Verbalized understanding Ordered echo in 1 year

## 2015-06-23 ENCOUNTER — Encounter: Payer: Self-pay | Admitting: Cardiovascular Disease

## 2015-06-30 ENCOUNTER — Other Ambulatory Visit: Payer: Self-pay | Admitting: Cardiology

## 2015-06-30 ENCOUNTER — Ambulatory Visit (INDEPENDENT_AMBULATORY_CARE_PROVIDER_SITE_OTHER): Payer: 59 | Admitting: Cardiology

## 2015-06-30 ENCOUNTER — Encounter: Payer: Self-pay | Admitting: Cardiology

## 2015-06-30 VITALS — BP 152/80 | HR 73 | Ht 63.0 in | Wt 170.4 lb

## 2015-06-30 DIAGNOSIS — Z950 Presence of cardiac pacemaker: Secondary | ICD-10-CM | POA: Diagnosis not present

## 2015-06-30 DIAGNOSIS — I4819 Other persistent atrial fibrillation: Secondary | ICD-10-CM

## 2015-06-30 DIAGNOSIS — I481 Persistent atrial fibrillation: Secondary | ICD-10-CM | POA: Diagnosis not present

## 2015-06-30 MED ORDER — ATENOLOL 50 MG PO TABS: 50.0000 mg | ORAL_TABLET | Freq: Every day | ORAL | Status: DC

## 2015-06-30 NOTE — Telephone Encounter (Signed)
Electronic refill for [facility]ol [facility].

## 2015-06-30 NOTE — Patient Instructions (Signed)
Medication Instructions:  Your physician recommends that you continue on your current medications as directed. Please refer to the Current Medication list given to you today.  Labwork: None  ordered  Testing/Procedures: None ordered  Follow-Up: Remote monitoring is used to monitor your Pacemaker of ICD from home. This monitoring reduces the number of office visits required to check your device to one time per year. It allows [place] to keep an eye on the functioning of your device to ensure it is working properly. You are scheduled for a device check from home on 09/29/15. You may send your transmission at any time that day. If you have a wireless device, the transmission will be sent automatically. After your physician reviews your transmission, you will receive a postcard with your next transmission date.  Your physician wants you to follow-up in: 1 year with Dr. Curt Bears.  You will receive a reminder letter in the mail two months in advance. If you don't receive a letter, please call our office to schedule the follow-up appointment.  If you need a refill on your cardiac medications before your next appointment, please call your pharmacy.  Thank you for choosing [facility]!!   Trinidad Curet, RN [phone]

## 2015-06-30 NOTE — Progress Notes (Signed)
Electrophysiology Office Note   Date:  06/30/2015   ID:  Krystal Little, DOB October 13, 1955, MRN [id number]  PCP:  Paula Compton, MD  Cardiologist:  Glenetta Hew Primary Electrophysiologist:  Kambra Beachem Meredith Leeds, MD    No chief complaint on file.    History of Present Illness: Krystal Little is a 59 y.o. female who presents today for electrophysiology evaluation.      She has a history of rheumatic heart disease the resulted in marked moderate to severe mitral regurgitation or prolapse. In December 2000 she presented with syncope and sick sinus syndrome. She underwent mitral valve repair with a ring annuloplasty and transposition of chordae followed by pacemaker placement. She has chronic atrial fibrillation as well as atrial flutter with an attempted ablation of complex atrial flutter awake forest. She has tried multiple antiarrhythmics and failed  Sotalol and  Dofetilide.   Today, she denies symptoms of palpitations, chest pain, shortness of breath, orthopnea, PND, lower extremity edema, claudication, dizziness, presyncope, syncope, bleeding, or neurologic sequela. The patient is tolerating medications without difficulties and is otherwise without complaint today.    Past Medical History  Diagnosis Date  . 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 Chronic atrial fibrillation ([place]) 1990s    s/p DCCV then attempted ablation of complex A Flutter (@ [facility] - Dr. Deno Etienne), failed antiarrhythmics --> INR folllwed @ [facility]  . Hx of sick sinus syndrome 07/1999    Wtih symptomatic bradycardia - syncope (Tachy-Brady)  . 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 History of  cardiac catheterization 2001    R&LHC - normal Coronaries, no evidence of Restictive Cardiomyopathy or Constrictive Pericarditis (also @ [facility])  . Diabetes mellitus type 2, uncontrolled, without complications (HCC)     On Oral Medications ([facility])  . Essential hypertension    Past Surgical History  Procedure Laterality Date  . Mitral valve repair  07/1999    Both Ant& Post leaflet repair - quadrangular resection&caudal transposition, #32 Sequin Annuloplasty ring  . Transthoracic echocardiogram  03/21/2013  . Cardiac catheterization  08/03/1999    [facility] - normal Coronaries; no sign of constriction or restrictive cardiomypathy  . Pacemaker insertion  07/1999    for SSS in setting of Chronic Afib; Medtronic Kappa [id number], [id number] H.  . Pacemaker generator change  11/26/2008    Medtronic Adapta L(? if it has been checked since 02/2013)     Current Outpatient Prescriptions  Medication Sig Dispense Refill  . atenolol (TENORMIN) 50 MG tablet Take 1 tablet (50 mg total) by mouth daily. 30 tablet 11  . diltiazem (CARDIZEM CD) 180 MG 24 hr capsule Take 1 capsule (180 mg total) by mouth daily. 30 capsule 11  . glimepiride (AMARYL) 2 MG tablet TAKE ONE TABLET BY MOUTH ONCE DAILY BEFORE BREAKFAST 90 tablet 0  . lisinopril (PRINIVIL,ZESTRIL) 5 MG tablet Take 1 tablet (5 mg total) by mouth daily. 30 tablet 11  . metFORMIN (GLUCOPHAGE-XR) 500 MG 24 hr tablet Take 1 tablet (500 mg total) by mouth 2 (two) times daily with a meal. 180 tablet 5  . warfarin (COUMADIN) 5 MG tablet TAKE ONE TABLET BY MOUTH AS  DIRECTED 90 tablet 2   No current facility-administered medications for this visit.    Allergies:   Review of patient's allergies indicates no known allergies.   Social History:  The patient  reports that she has never smoked. She does not have any smokeless tobacco history on file. She reports that she drinks alcohol. She reports that she does not use illicit drugs.   Family History:  The  patient's family history includes Diabetes in her sister; Hypertension in her mother; Lung cancer in her brother.    ROS:  Please see the history of present illness.   All other systems are reviewed and negative.    PHYSICAL EXAM: VS:  There were no vitals taken for this visit. , BMI There is no weight on file to calculate BMI. GEN: Well nourished, well developed, in no acute distress HEENT: normal Neck: no JVD, carotid bruits, or masses Cardiac: RRR; no murmurs, rubs, or gallops,no edema  Respiratory:  clear to auscultation bilaterally, normal work of breathing GI: soft, nontender, nondistended, + BS MS: no deformity or atrophy Skin: warm and dry, device pocket is well healed Neuro:  Strength and sensation are intact Psych: euthymic mood, full affect  EKG:  EKG is not ordered today. The ekg ordered 05/28/15 shows atrial fibrillation with intermittent V pacing  Recent Labs: No results found for requested labs within last 365 days.    Lipid Panel     Component Value Date/Time   CHOL 182 06/25/2014 0909   TRIG 111 06/25/2014 0909   HDL 50 06/25/2014 0909   CHOLHDL 3.6 06/25/2014 0909   VLDL 22 06/25/2014 0909   LDLCALC 110* 06/25/2014 0909   LDLDIRECT 107* 10/02/2012 1018     Wt Readings from Last 3 Encounters:  06/01/15 162 lb 12.8 oz (73.846 kg)  01/07/15 168 lb 8 oz (76.431 kg)  06/08/14 162 lb 12.8 oz (73.846 kg)      Other studies Reviewed: Additional studies/ records that were reviewed today include: TTE 06/12/15  Review of the above records today demonstrates:  - Left ventricle: The cavity size was normal. There was mild concentric hypertrophy. Systolic function was normal. The estimated ejection fraction was in the range of 55% to 60%. Wall motion was normal; there were no regional wall motion abnormalities. Doppler parameters are consistent with a reversible restrictive pattern, indicative of decreased left ventricular diastolic compliance  and/or increased left atrial pressure (grade 3 diastolic dysfunction). Doppler parameters are consistent with high ventricular filling pressure. - Aortic valve: Calcified annulus. Trileaflet; normal thickness leaflets. There was trivial regurgitation. - Mitral valve: Calcified annulus. Mobility of the posterior leaflet was restricted. The findings are consistent with mild stenosis. There was mild regurgitation. Mean gradient (D): 6 mm Hg. Valve area by pressure half-time: 1.96 cm^2. - Left atrium: The atrium was severely dilated. - Right ventricle: The cavity size was mildly to moderately dilated. Wall thickness was normal. Systolic function was moderately reduced. - Right atrium: The atrium was mildly dilated. - Atrial septum: No defect or patent foramen ovale was identified. - Tricuspid valve: There was moderate regurgitation. - Inferior vena cava: The vessel was dilated. The respirophasic diameter changes were blunted (< 50%), consistent with elevated central venous pressure.  ASSESSMENT AND PLAN:  1.  Atrial fibrillation: on coumadin for MVR as well as atrial fibrillation.  Erico Stan need life long anticoagulation at this point.  She is not symptomatic from her AF at this time.    2.  Sick sinus syndrome s/p  pacemaker: functioning well without issues.  Amaree Loisel continue current care for her pacemaker.  No changes were made today.    Current medicines are reviewed at length with the patient today.   The patient does not have concerns regarding her medicines.  The following changes were made today:  none  Labs/ tests ordered today include:  No orders of the defined types were placed in this encounter.     Disposition:   FU with Taaj Hurlbut 1 year  Signed, Cranston Koors Meredith Leeds, MD  06/30/2015 3:02 PM     [place] [address] [phone] (office) [phone] (fax)

## 2015-06-30 NOTE — Telephone Encounter (Signed)
°*  STAT* If patient is at the pharmacy, call can be transferred to refill team.   1. Which medications need to be refilled? (please list name of each medication and dose if known) Atenolol  2. Which pharmacy/location (including street and city if local pharmacy) is medication to be sent to?[facility] [address]  3. Do they need a 30 day or 90 day supply? [place]

## 2015-07-08 ENCOUNTER — Other Ambulatory Visit: Payer: Self-pay | Admitting: Cardiology

## 2015-07-08 ENCOUNTER — Other Ambulatory Visit: Payer: Self-pay | Admitting: *Deleted

## 2015-07-08 MED ORDER — LISINOPRIL 5 MG PO TABS: 5.0000 mg | ORAL_TABLET | Freq: Every day | ORAL | Status: DC

## 2015-07-08 NOTE — Telephone Encounter (Signed)
E-sent to pharmacy-- lisinopril

## 2015-09-29 ENCOUNTER — Telehealth: Payer: Self-pay | Admitting: Cardiology

## 2015-09-29 ENCOUNTER — Encounter: Payer: Self-pay | Admitting: *Deleted

## 2015-09-29 NOTE — Telephone Encounter (Signed)
LMOVM reminding pt to send remote transmission.   

## 2015-10-01 ENCOUNTER — Encounter: Payer: Self-pay | Admitting: Cardiology

## 2015-11-03 LAB — HM DIABETES EYE EXAM

## 2015-11-16 ENCOUNTER — Other Ambulatory Visit: Payer: Self-pay | Admitting: Family Medicine

## 2015-11-16 NOTE — Telephone Encounter (Signed)
Pt is calling for a refill on her Glimepiride. She is also using a NEW PHARMACY [facility] on [address]. jw

## 2015-11-18 MED ORDER — GLIMEPIRIDE 2 MG PO TABS: ORAL_TABLET | ORAL | Status: DC

## 2016-03-27 ENCOUNTER — Other Ambulatory Visit: Payer: Self-pay | Admitting: *Deleted

## 2016-03-27 DIAGNOSIS — Z7901 Long term (current) use of anticoagulants: Secondary | ICD-10-CM

## 2016-03-27 NOTE — Telephone Encounter (Signed)
Can patient wait and instead make appt to be seen? Coming up on exactly 1 year since patient was last seen in [facility].

## 2016-03-29 ENCOUNTER — Other Ambulatory Visit: Payer: Self-pay | Admitting: *Deleted

## 2016-03-29 MED ORDER — WARFARIN SODIUM 5 MG PO TABS: ORAL_TABLET | ORAL | 0 refills | Status: DC

## 2016-03-29 NOTE — Telephone Encounter (Signed)
Rx refilled. Thank you.

## 2016-03-29 NOTE — Telephone Encounter (Signed)
Pt made appt for 04/07/16 with MD.  She would like a refill of her coumadin to last until then, she has been out for 2 days.  Will forward to MD.  She would like a callback after this is done. Fleeger, Salome Spotted

## 2016-03-29 NOTE — Telephone Encounter (Signed)
[place] informing pt that "rx you requested has been sent". Fleeger, Salome Spotted, CMA

## 2016-04-07 ENCOUNTER — Ambulatory Visit (INDEPENDENT_AMBULATORY_CARE_PROVIDER_SITE_OTHER): Payer: BLUE CROSS/BLUE SHIELD | Admitting: Family Medicine

## 2016-04-07 ENCOUNTER — Ambulatory Visit (INDEPENDENT_AMBULATORY_CARE_PROVIDER_SITE_OTHER): Payer: BLUE CROSS/BLUE SHIELD | Admitting: *Deleted

## 2016-04-07 ENCOUNTER — Encounter: Payer: Self-pay | Admitting: Family Medicine

## 2016-04-07 VITALS — BP 121/54 | HR 71 | Temp 98.2°F | Ht 63.0 in | Wt 159.0 lb

## 2016-04-07 DIAGNOSIS — Z7901 Long term (current) use of anticoagulants: Secondary | ICD-10-CM

## 2016-04-07 DIAGNOSIS — E1165 Type 2 diabetes mellitus with hyperglycemia: Secondary | ICD-10-CM | POA: Diagnosis not present

## 2016-04-07 DIAGNOSIS — IMO0001 Reserved for inherently not codable concepts without codable children: Secondary | ICD-10-CM

## 2016-04-07 DIAGNOSIS — Z23 Encounter for immunization: Secondary | ICD-10-CM | POA: Diagnosis not present

## 2016-04-07 DIAGNOSIS — I482 Chronic atrial fibrillation, unspecified: Secondary | ICD-10-CM

## 2016-04-07 LAB — POCT INR: INR: 1.9

## 2016-04-07 LAB — POCT GLYCOSYLATED HEMOGLOBIN (HGB A1C): HEMOGLOBIN A1C: 8.1

## 2016-04-07 NOTE — Addendum Note (Signed)
Addended by: Levert Feinstein F on: 04/07/2016 04:18 PM   Modules accepted: Orders

## 2016-04-07 NOTE — Assessment & Plan Note (Signed)
Currently on coumadin 5mg  daily. Will return in 2 weeks lab visit for INR recheck. Discussed NOACs today and patient will do more research at home.

## 2016-04-07 NOTE — Patient Instructions (Addendum)
Thank you for coming in today to discuss your diabetes. It was very nice to meet you! Please increase your metformin from once a day to twice a day since you are now tolerating the medication well. Your plan to exercise at the [facility] and the healthy foods book sound like excellent plans.  Thank you for getting the flu shot and the pneumonia shot today.  Please come back fasting for at least 8 hours in 2 weeks for a lab visit. Please schedule a 1 month follow up for a wellness visit and we will also talk about how you are doing with your diabetes.

## 2016-04-07 NOTE — Progress Notes (Signed)
    Subjective:  Krystal Little is a 60 y.o. female who presents to the [facility] today for a diabetes followup and INR check  HPI: Diabetes - Has been only taking metformin once a day since twice a day gave her GI upset. Is willing to try twice a day now that tolerating medication. Denies n/v, abdominal pain, diarrhea, constipation. Is also taking glimepiride daily. - Diet: just bought a book with health recipes that plans to try. States goal weight is 145lbs but thinks more realistic goal is 150lbs. - Exercise: recently joined the [place] has had diabetic eye exam in the last year, is willing to sign records release. Denies HA, blurry vision  Afib - States sees cardiology yearly and had no concerns today. Has not been seen here for INR check in past 1 year. Is willing to come to more frequent visits and voiced good understanding of why regular monitoring was necessary (dangers of being subtherapeutic or supratherapeutic). Denies CP, palpitation, SOB, easy bruising. - Has never been tried on NOAC and is heistant due to hearing about some side effects on TV commercials. Wants more information on NOACs and was willing to speak with pharmacy student today.    ROS: Per HPI  Objective:  Physical Exam: BP (!) 121/54   Pulse 71   Temp 98.2 F (36.8 C) (Oral)   Ht 5\' 3"  (1.6 m)   Wt 159 lb (72.1 kg)   BMI 28.17 kg/m   Gen: NAD, resting comfortably CV: RRR with no murmurs appreciated Pulm: NWOB, CTAB with no crackles, wheezes, or rhonchi GI: Normal bowel sounds present. Soft, Nontender, Nondistended. MSK: no edema, cyanosis, or clubbing noted. Diabetic foot exam without abnormalities and intact to monofilament b/l. Skin: warm, dry Neuro: grossly normal, moves all extremities Psych: Normal affect and thought content  Results for orders placed or performed in visit on 04/07/16 (from the past 72 hour(s))  POCT HgB A1C (CPT 83036)     Status: Abnormal   Collection Time: 04/07/16  1:40  PM  Result Value Ref Range   Hemoglobin A1C 8.1    INR 1.9  Assessment/Plan:  Diabetes mellitus type 2, uncontrolled, without complications Patient will try metformin twice a day, states has plenty of medication at home. Will continue glimepiride daily. Will return for lab visit in 2 weeks fasting for lipid profile, BMP, CBC.  Will try lifestyle modifications of diet and exercise, patient has concrete plans for both. Get records release for eye doctor visit.  Long term (current) use of anticoagulants Currently on coumadin 5mg  daily. Will return in 2 weeks lab visit for INR recheck. Discussed NOACs today and patient will do more research at home.   Health Maintenance - flu vaccine and pneumovax today - Will draw HIV at lab visit in [redacted] weeks along with other bloodwork  1 month follow up for annual wellness and diabetes follow up  Bufford Lope, DO PGY-1, [facility] 04/07/2016 2:21 PM

## 2016-04-07 NOTE — Assessment & Plan Note (Addendum)
Patient will try metformin twice a day, states has plenty of medication at home. Will continue glimepiride daily. Will return for lab visit in 2 weeks fasting for lipid profile, BMP, CBC.  Will try lifestyle modifications of diet and exercise, patient has concrete plans for both. Get records release for eye doctor visit.

## 2016-04-24 ENCOUNTER — Other Ambulatory Visit: Payer: Self-pay | Admitting: *Deleted

## 2016-04-24 MED ORDER — GLIMEPIRIDE 2 MG PO TABS: ORAL_TABLET | ORAL | 0 refills | Status: DC

## 2016-04-28 ENCOUNTER — Other Ambulatory Visit: Payer: Self-pay

## 2016-04-28 MED ORDER — ATENOLOL 50 MG PO TABS: 50.0000 mg | ORAL_TABLET | Freq: Every day | ORAL | 3 refills | Status: DC

## 2016-05-18 ENCOUNTER — Ambulatory Visit (INDEPENDENT_AMBULATORY_CARE_PROVIDER_SITE_OTHER): Payer: BLUE CROSS/BLUE SHIELD | Admitting: Family Medicine

## 2016-05-18 ENCOUNTER — Other Ambulatory Visit (INDEPENDENT_AMBULATORY_CARE_PROVIDER_SITE_OTHER): Payer: BLUE CROSS/BLUE SHIELD

## 2016-05-18 ENCOUNTER — Encounter: Payer: Self-pay | Admitting: Family Medicine

## 2016-05-18 VITALS — BP 135/68 | HR 71 | Temp 97.8°F | Wt 160.0 lb

## 2016-05-18 DIAGNOSIS — E1165 Type 2 diabetes mellitus with hyperglycemia: Secondary | ICD-10-CM

## 2016-05-18 DIAGNOSIS — E119 Type 2 diabetes mellitus without complications: Secondary | ICD-10-CM | POA: Diagnosis not present

## 2016-05-18 DIAGNOSIS — Z7901 Long term (current) use of anticoagulants: Secondary | ICD-10-CM

## 2016-05-18 DIAGNOSIS — I482 Chronic atrial fibrillation, unspecified: Secondary | ICD-10-CM

## 2016-05-18 DIAGNOSIS — Z0000 Encounter for general adult medical examination without abnormal findings: Secondary | ICD-10-CM

## 2016-05-18 DIAGNOSIS — IMO0001 Reserved for inherently not codable concepts without codable children: Secondary | ICD-10-CM

## 2016-05-18 LAB — BASIC METABOLIC PANEL WITH GFR
BUN: 12 mg/dL (ref 7–25)
CALCIUM: 8.9 mg/dL (ref 8.6–10.4)
CHLORIDE: 106 mmol/L (ref 98–110)
CO2: 28 mmol/L (ref 20–31)
CREATININE: 0.73 mg/dL (ref 0.50–0.99)
GFR, Est African American: >89 mL/min (ref >=60)
GFR, Est Non African American: 89 mL/min (ref >=60)
Glucose, Bld: 107 mg/dL — ABNORMAL HIGH (ref 65–99)
Potassium: 4.4 mmol/L (ref 3.5–5.3)
SODIUM: 141 mmol/L (ref 135–146)

## 2016-05-18 LAB — CBC
HCT: 40.7 % (ref 35.0–45.0)
HEMOGLOBIN: 13.6 g/dL (ref 11.7–15.5)
MCH: 30.6 pg (ref 27.0–33.0)
MCHC: 33.4 g/dL (ref 32.0–36.0)
MCV: 91.5 fL (ref 80.0–100.0)
MPV: 10.6 fL (ref 7.5–12.5)
PLATELETS: 164 10*3/uL (ref 140–400)
RBC: 4.45 MIL/uL (ref 3.80–5.10)
RDW: 13.7 % (ref 11.0–15.0)
WBC: 4.4 10*3/uL (ref 3.8–10.8)

## 2016-05-18 LAB — LIPID PANEL
CHOL/HDL RATIO: 3.4 ratio (ref <=5.0)
Cholesterol: 176 mg/dL (ref 125–200)
HDL: 52 mg/dL (ref >=46)
LDL CALC: 107 mg/dL (ref <130)
TRIGLYCERIDES: 87 mg/dL (ref <150)
VLDL: 17 mg/dL (ref <30)

## 2016-05-18 LAB — POCT INR: INR: 2.2

## 2016-05-18 MED ORDER — METFORMIN HCL ER 500 MG PO TB24: 500.0000 mg | ORAL_TABLET | Freq: Two times a day (BID) | ORAL | 5 refills | Status: DC

## 2016-05-18 NOTE — Progress Notes (Signed)
    Subjective:  Krystal Little is a 60 y.o. female who presents to the [facility] today for a diabetes follow-up.  HPI: Diabetes - States had tried metformin 100mg  daily in addition to glimepiride every morning for 2 weeks and had GI upset. Misunderstood instructions from last visit and is amenable to try metformin 500mg  BID. - States has not made any progress with lifestyle modifications, feels meal plan would be most helpful.  Coumadin for Atrial fibrillation - Missed 2 week lab visit but had labs drawn today. Is amenable to more frequent visits while coumadin is being restarted. - has been compliant on coumadin 5mg  daily and is consistent with greens she eats daily  Health maintenance - Is willing to have colonoscopy  - Refused mammogram, had unpleasant experience in 2012 where needed additional scan. While ultimately mammogram was completely benign, was stressful to the point that is not willing to go back at this time. - Willing to come back for pap smear   ROS: Per HPI  Objective:  Physical Exam: BP 135/68   Pulse 71   Temp 97.8 F (36.6 C) (Oral)   Wt 160 lb (72.6 kg)   BMI 28.34 kg/m   Gen: NAD, sitting in chair comfortably CV: RRR with no murmurs appreciated Pulm: NWOB, CTAB with no crackles, wheezes, or rhonchi GI: Normal bowel sounds present. Soft, Nontender, Nondistended. MSK: no edema, cyanosis, or clubbing noted Skin: warm, dry Neuro: grossly normal, moves all extremities Psych: Normal affect and thought content  Results for orders placed or performed in visit on 05/18/16 (from the past 72 hour(s))  POCT INR     Status: None   Collection Time: 05/18/16 10:00 AM  Result Value Ref Range   INR 2.2      Assessment/Plan:  DM (diabetes mellitus), type 2, uncontrolled Patient will reattempt increased dose of metformin, will try 500mg  BID. Will continue to take glimepiride. Wants to see nutritionist, gave Dr. Jenne Campus info to patient. Will see back in 1 month for  diabetes follow up and recheck a1c at that time.  Long term current use of anticoagulant therapy Recently restarted coumadin in late September, missed 2 week INR check. Today INR 2.2 but stressed importance of close monitoring for now. Patient will return for lab visit to check INR in 2 weeks  Health care maintenance Colonoscopy information given, patient to schedule Diabetic optho exam reviewed and in chart. Refused mammogram Will schedule wellness visit for pap smear and will offer zostavax at that time.  Patient had labs drawn today that were ordered last visit, will follow up on results.  Bufford Lope, DO PGY-1, [facility] 05/18/2016 1:55 PM

## 2016-05-18 NOTE — Assessment & Plan Note (Signed)
Recently restarted coumadin in late September, missed 2 week INR check. Today INR 2.2 but stressed importance of close monitoring for now. Patient will return for lab visit to check INR in 2 weeks

## 2016-05-18 NOTE — Assessment & Plan Note (Signed)
Colonoscopy information given, patient to schedule Diabetic optho exam reviewed and in chart. Refused mammogram Will schedule wellness visit for pap smear and will offer zostavax at that time.

## 2016-05-18 NOTE — Assessment & Plan Note (Signed)
Patient will reattempt increased dose of metformin, will try 500mg  BID. Will continue to take glimepiride. Wants to see nutritionist, gave Dr. Jenne Campus info to patient. Will see back in 1 month for diabetes follow up and recheck a1c at that time.

## 2016-05-18 NOTE — Patient Instructions (Addendum)
It was great to see you again!  For your Diabetes,  - Please take metformin 500mg  and glimeripide in the morning; metformin 500mg  at dinner - Please call and schedule with our nutritionist Dr Jenne Campus - Return at the end of December for a diabetes follow up  For your atrial fibrillation - Have a lab visit in 2 weeks to check INR to make sure you're doing okay on the coumadin  For your blood pressure - You are doing good on your current medications, no changes today!  - I'll let you know about your electrolytes which are really important to be monitored while you're on blood pressure medications  For health maintenance - Please call and schedule a colonoscopy - I will see you back for a wellness visit at your convenience, we will do a pap smear at that time  Someone will call you or send you a letter with the results of your lab work when they are available.   Take care and seek immediate care sooner if you develop any concerns.   Dr. Bufford Lope, DO [facility]  Fat and Cholesterol Restricted Diet High levels of fat and cholesterol in your blood may lead to various health problems, such as diseases of the heart, blood vessels, gallbladder, liver, and pancreas. Fats are concentrated sources of energy that come in various forms. Certain types of fat, including saturated fat, may be harmful in excess. Cholesterol is a substance needed by your body in small amounts. Your body makes all the cholesterol it needs. Excess cholesterol comes from the food you eat. When you have high levels of cholesterol and saturated fat in your blood, health problems can develop because the excess fat and cholesterol will gather along the walls of your blood vessels, causing them to narrow. Choosing the right foods will help you control your intake of fat and cholesterol. This will help keep the levels of these substances in your blood within normal limits and reduce your risk of disease. WHAT IS MY  PLAN? Your health care provider recommends that you:  Get no more than __________ % of the total calories in your daily diet from fat.  Limit your intake of saturated fat to less than ______% of your total calories each day.  Limit the amount of cholesterol in your diet to less than _________mg per day. WHAT TYPES OF FAT SHOULD I CHOOSE?  Choose healthy fats more often. Choose monounsaturated and polyunsaturated fats, such as olive and canola oil, flaxseeds, walnuts, almonds, and seeds.  Eat more omega-3 fats. Good choices include salmon, mackerel, sardines, tuna, flaxseed oil, and ground flaxseeds. Aim to eat fish at least two times a week.  Limit saturated fats. Saturated fats are primarily found in animal products, such as meats, butter, and cream. Plant sources of saturated fats include palm oil, palm kernel oil, and coconut oil.  Avoid foods with partially hydrogenated oils in them. These contain trans fats. Examples of foods that contain trans fats are stick margarine, some tub margarines, cookies, crackers, and other baked goods. WHAT GENERAL GUIDELINES DO I NEED TO FOLLOW? These guidelines for healthy eating will help you control your intake of fat and cholesterol:  Check food labels carefully to identify foods with trans fats or high amounts of saturated fat.  Fill one half of your plate with vegetables and green salads.  Fill one fourth of your plate with whole grains. Look for the word "whole" as the first word in the ingredient  list.  Fill one fourth of your plate with lean protein foods.  Limit fruit to two servings a day. Choose fruit instead of juice.  Eat more foods that contain soluble fiber. Examples of foods that contain this type of fiber are apples, broccoli, carrots, beans, peas, and barley. Aim to get 20-30 g of fiber per day.  Eat more home-cooked food and less restaurant, buffet, and fast food.  Limit or avoid alcohol.  Limit foods high in starch and  sugar.  Limit fried foods.  Cook foods using methods other than frying. Baking, boiling, grilling, and broiling are all great options.  Lose weight if you are overweight. Losing just 5-10% of your initial body weight can help your overall health and prevent diseases such as diabetes and heart disease. WHAT FOODS CAN I EAT? Grains Whole grains, such as whole wheat or whole grain breads, crackers, cereals, and pasta. Unsweetened oatmeal, bulgur, barley, quinoa, or brown rice. Corn or whole wheat flour tortillas. Vegetables Fresh or frozen vegetables (raw, steamed, roasted, or grilled). Green salads. Fruits All fresh, canned (in natural juice), or frozen fruits. Meat and Other Protein Products Ground beef (85% or leaner), grass-fed beef, or beef trimmed of fat. Skinless chicken or [place]. Ground chicken or [place]. Pork trimmed of fat. All fish and seafood. Eggs. Dried beans, peas, or lentils. Unsalted nuts or seeds. Unsalted canned or dry beans. Dairy Low-fat dairy products, such as skim or 1% milk, 2% or reduced-fat cheeses, low-fat ricotta or cottage cheese, or plain low-fat yogurt. Fats and Oils Tub margarines without trans fats. Light or reduced-fat mayonnaise and salad dressings. Avocado. Olive, canola, sesame, or safflower oils. Natural peanut or almond butter (choose ones without added sugar and oil). The items listed above may not be a complete list of recommended foods or beverages. Contact your dietitian for more options. WHAT FOODS ARE NOT RECOMMENDED? Grains White bread. White pasta. White rice. Cornbread. Bagels, pastries, and croissants. Crackers that contain trans fat. Vegetables White potatoes. Corn. Creamed or fried vegetables. Vegetables in a cheese sauce. Fruits Dried fruits. Canned fruit in light or heavy syrup. Fruit juice. Meat and Other Protein Products Fatty cuts of meat. Ribs, chicken wings, bacon, sausage, bologna, salami, chitterlings, fatback, hot dogs,  bratwurst, and packaged luncheon meats. Liver and organ meats. Dairy Whole or 2% milk, cream, half-and-half, and cream cheese. Whole milk cheeses. Whole-fat or sweetened yogurt. Full-fat cheeses. Nondairy creamers and whipped toppings. Processed cheese, cheese spreads, or cheese curds. Sweets and Desserts Corn syrup, sugars, honey, and molasses. Candy. Jam and jelly. Syrup. Sweetened cereals. Cookies, pies, cakes, donuts, muffins, and ice cream. Fats and Oils Butter, stick margarine, lard, shortening, ghee, or bacon fat. Coconut, palm kernel, or palm oils. Beverages Alcohol. Sweetened drinks (such as sodas, lemonade, and fruit drinks or punches). The items listed above may not be a complete list of foods and beverages to avoid. Contact your dietitian for more information.   This information is not intended to replace advice given to you by your health care provider. Make sure you discuss any questions you have with your health care provider.   Document Released: 07/03/2005 Document Revised: 07/24/2014 Document Reviewed: 10/01/2013 Elsevier Interactive Patient Education [employer].

## 2016-05-19 ENCOUNTER — Encounter: Payer: Self-pay | Admitting: Family Medicine

## 2016-05-19 LAB — HIV ANTIBODY (ROUTINE TESTING W REFLEX): HIV: NONREACTIVE

## 2016-05-31 ENCOUNTER — Encounter: Payer: Self-pay | Admitting: Cardiology

## 2016-05-31 ENCOUNTER — Ambulatory Visit (INDEPENDENT_AMBULATORY_CARE_PROVIDER_SITE_OTHER): Payer: BLUE CROSS/BLUE SHIELD | Admitting: Cardiology

## 2016-05-31 VITALS — BP 134/84 | HR 65 | Ht 64.0 in | Wt 157.8 lb

## 2016-05-31 DIAGNOSIS — I152 Hypertension secondary to endocrine disorders: Secondary | ICD-10-CM

## 2016-05-31 DIAGNOSIS — I10 Essential (primary) hypertension: Secondary | ICD-10-CM

## 2016-05-31 DIAGNOSIS — E1159 Type 2 diabetes mellitus with other circulatory complications: Secondary | ICD-10-CM

## 2016-05-31 DIAGNOSIS — I080 Rheumatic disorders of both mitral and aortic valves: Secondary | ICD-10-CM

## 2016-05-31 DIAGNOSIS — Z950 Presence of cardiac pacemaker: Secondary | ICD-10-CM

## 2016-05-31 DIAGNOSIS — Z9889 Other specified postprocedural states: Secondary | ICD-10-CM | POA: Diagnosis not present

## 2016-05-31 DIAGNOSIS — Z7901 Long term (current) use of anticoagulants: Secondary | ICD-10-CM

## 2016-05-31 DIAGNOSIS — I482 Chronic atrial fibrillation, unspecified: Secondary | ICD-10-CM

## 2016-05-31 DIAGNOSIS — I5189 Other ill-defined heart diseases: Secondary | ICD-10-CM

## 2016-05-31 DIAGNOSIS — I519 Heart disease, unspecified: Secondary | ICD-10-CM

## 2016-05-31 MED ORDER — LISINOPRIL 5 MG PO TABS: 5.0000 mg | ORAL_TABLET | Freq: Every day | ORAL | 3 refills | Status: DC

## 2016-05-31 MED ORDER — ATENOLOL 50 MG PO TABS: 50.0000 mg | ORAL_TABLET | Freq: Every day | ORAL | 3 refills | Status: DC

## 2016-05-31 MED ORDER — DILTIAZEM HCL ER COATED BEADS 180 MG PO CP24: 180.0000 mg | ORAL_CAPSULE | Freq: Every day | ORAL | 3 refills | Status: DC

## 2016-05-31 NOTE — Patient Instructions (Addendum)
   SCHEDULE FOR 06/15/16 AT 9:30   ( [place]) CHECK BLOOD PRESSURE AT THE TIME OF ECHO AND DOCUMENT IN [place] physician has requested that you have an echocardiogram. Echocardiography is a painless test that uses sound waves to create images of your heart. It provides your doctor with information about the size and shape of your heart and how well your heart's chambers and valves are working. This procedure takes approximately one hour. There are no restrictions for this procedure.  MEDICATIONS REFILLED  Your physician wants you to follow-up in: [place].You will receive a reminder letter in the mail two months in advance. If you don't receive a letter, please call our office to schedule the follow-up appointment.  If you need a refill on your cardiac medications before your next appointment, please call your pharmacy.

## 2016-05-31 NOTE — Progress Notes (Signed)
PCP: Bufford Lope, DO  Clinic Note: Chief Complaint  Patient presents with  . Follow-up    12 months; Pt states no Sx.   . Cardiac Valve Problem    Status post mitral valve repair for mitral regurgitation  . Pacemaker Check    HPI: Krystal Little is a 60 y.o. 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  She had a long-standing history of rheumatic heart disease child says, had moderate to severe MR with prolapse. This culminated with a severe acute syncopal event with sick sinus syndrome and December 2000. She underwent mitral valve repair with ring annuloplasty transposition of chordae by Dr. Cyndia Bent. Subsequently had a pacemaker placed by Dr. Rollene Fare. She has maintained chronic atrial fibrillation and flutter with attempted ablation of complex a flutter with forced. Unfortunately tried and failed antiarrhythmics including sotalol and Tikosyn. She is maintained on warfarin for full anticoagulation.  Krystal Little was last seen by me on 06/01/2015 - she really didn't have any complaints at that time. No major issues from her A. fib. No rapid rates. Mild swelling of her feet at the end of the day. She was seen by Dr. Curt Bears for pacemaker follow-up in December 2016 - pacemaker appeared to be functioning well.  Recent Hospitalizations: None  Studies Reviewed:  2-D echocardiogram from November 2016: Mild concentric LVH. EF 55-60%. No RWMA. Gr 3 DD*, calcified mitral annulus. Restricted mobility of posterior leaflet. Mild stenosis of (mean gradient 6 mmHg). Severe LA dilation. Mild-moderate RV dilation with moderately reduced function. Dilated IVC with blunted history collapse consistent with elevated CVP. (Plan was to recheck this year)  Interval History: Krystal Little presents today overall doing fairly well. 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 She does indicate  that she has not been as active as she had been before. She says that she used to walk about 2 miles a day, but the neighbor is now not a safe with more dogs walking around. She got out of the habit of walking, and her walking partner has a difficult work schedule. She denies any dyspnea with routine activity or even with stairs. No chest pain with rest or exertion. She just notes a little bit of fatigue that is probably more of a lack of get up and go.  No PND, orthopnea or edema.  No palpitations, lightheadedness, dizziness, weakness or syncope/near syncope. No TIA/amaurosis fugax symptoms. No melena, hematochezia, hematuria, or epstaxis. No claudication.  ROS: A comprehensive was performed. Review of Systems  Constitutional: Negative for chills, fever and malaise/fatigue (Somewhat decreased exercise tolerance but not baseline fatigue.).  HENT: Negative for congestion and nosebleeds.   Eyes: Negative.   Respiratory: Positive for shortness of breath (If she overexerts). Negative for cough and wheezing.   Cardiovascular: Positive for leg swelling (End of day swelling mild greater than right). Negative for claudication.       Per history of present illness  Gastrointestinal: Negative for blood in stool, constipation, heartburn and melena.  Genitourinary: Negative for hematuria.  Musculoskeletal: Negative for back pain, falls and joint pain.  Skin: Negative.   Neurological: Negative for dizziness, seizures, loss of consciousness and weakness.  Psychiatric/Behavioral: Negative for depression and memory loss. The patient is not nervous/anxious and does not have insomnia.   All other systems reviewed and are negative.   Past Medical  History:  Diagnosis Date  . Chronic atrial fibrillation ([place]) 1990s   s/p DCCV then attempted ablation of complex A Flutter (@ [facility] - Dr. Deno Etienne), failed antiarrhythmics --> INR folllwed @ [facility]  . Diabetes mellitus type 2, uncontrolled, without  complications (HCC)    On Oral Medications ([facility])  . Essential hypertension   . History of cardiac catheterization 2001   R&LHC - normal Coronaries, no evidence of Restictive Cardiomyopathy or Constrictive Pericarditis (also @ [facility])  . Hx of sick sinus syndrome 07/1999   Wtih symptomatic bradycardia - syncope ([place])  . 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 CARDIAC CATHETERIZATION  08/03/1999   [place] - normal Coronaries; no sign of constriction or restrictive cardiomypathy  . MITRAL VALVE REPAIR  07/1999   Both Ant& Post leaflet repair - quadrangular resection&caudal transposition, #32 Sequin Annuloplasty ring  . PACEMAKER GENERATOR CHANGE  11/26/2008   Medtronic Adapta L(? if it has been checked since 02/2013)  . PACEMAKER INSERTION  07/1999   for SSS in setting of Chronic Afib; Medtronic Kappa [id number], [id number] H.  . TRANSTHORACIC ECHOCARDIOGRAM  03/21/2013   Current Meds  Medication Sig  . atenolol (TENORMIN) 50 MG tablet Take 1 tablet (50 mg total) by mouth daily.  Marland Kitchen diltiazem (CARDIZEM CD) 180 MG 24 hr capsule Take 1 capsule (180 mg total) by mouth daily.  Marland Kitchen glimepiride (AMARYL) 2 MG tablet TAKE ONE TABLET BY MOUTH ONCE DAILY BEFORE BREAKFAST  . lisinopril (PRINIVIL,ZESTRIL) 5 MG tablet Take 1 tablet (5 mg total) by mouth daily.  . metFORMIN (GLUCOPHAGE-XR) 500 MG 24 hr tablet Take 1 tablet (500 mg total) by mouth 2 (two) times daily with a meal.  . warfarin (COUMADIN) 5 MG tablet TAKE ONE TABLET BY MOUTH AS  DIRECTED  . [DISCONTINUED] atenolol (TENORMIN) 50 MG  tablet Take 1 tablet (50 mg total) by mouth daily.  . [DISCONTINUED] atenolol (TENORMIN) 50 MG tablet Take 1 tablet (50 mg total) by mouth daily.  . [DISCONTINUED] diltiazem (CARDIZEM CD) 180 MG 24 hr capsule Take 1 capsule (180 mg total) by mouth daily.  . [DISCONTINUED] diltiazem (CARDIZEM CD) 180 MG 24 hr capsule Take 1 capsule (180 mg total) by mouth daily.  . [DISCONTINUED] lisinopril (PRINIVIL,ZESTRIL) 5 MG tablet Take 1 tablet (5 mg total) by mouth daily.  . [DISCONTINUED] lisinopril (PRINIVIL,ZESTRIL) 5 MG tablet Take 1 tablet (5 mg total) by mouth daily.   No Known Allergies   Social History   Social History  . Marital status: Married    Spouse name: Riki Rusk   . Number of children: 2  . Years of education: 16 years.   Occupational History  . 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 Smoking status: Never Smoker  . Smokeless tobacco: Never Used  . Alcohol use 0.0 oz/week     Comment: occasional, once every month or two- glass of wine   . Drug use: No  . Sexual activity: Yes  Partners: Male   Other Topics Concern  . None   Social History Narrative   Lives with husband, great-niece (62)  and great-nephew (56).    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   1 son died in an automobile accident @ age 57 (2010).    She raised several nieces & nephews - 1 nephew lives with her.   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   Family History  Problem Relation Age of Onset  . Hypertension Mother   . Diabetes Sister   . Lung cancer Brother     Wt Readings from Last 3 Encounters:  05/31/16 71.6 kg (157 lb 12.8 oz)  05/18/16 72.6 kg (160 lb)  04/07/16 72.1 kg (159 lb)    PHYSICAL EXAM BP 134/84   Pulse 65   Ht 5\' 4"  (1.626 m)   Wt 71.6 kg (157 lb 12.8 oz)   BMI 27.09 kg/m  General appearance:  alert, cooperative, appears stated age, no distress and otherwise healthy appearing  Neck: no adenopathy, no carotid bruit and no JVD with mild cannon A-waves Lungs: clear to auscultation bilaterally, normal percussion bilaterally and non-labored Heart:RRR; S1&S2 normal (occasionally split S2 & occasional ectopy), Soft systolic murmur @ LSB & apex - no Diastolic M, click, rub or gallop; non-displaced PMI  Abdomen: soft, non-tender; bowel sounds normal; no masses,  no organomegaly; Extremities: extremities normal, atraumatic, no cyanosis, and edema Pulses: 2+ and symmetric;  Skin: normal  Neurologic: Mental status: Alert, oriented, thought content appropriate Cranial nerves: normal (II-XII grossly intact)   Adult ECG Report  Rate: 65 ;  Rhythm: V paced Rhythm ;   Narrative Interpretation: stable  Other studies Reviewed: Additional studies/ records that were reviewed today include:  Recent Labs:   Lab Results  Component Value Date   HGB 13.6 05/18/2016   Lab Results  Component Value Date   CREATININE 0.73 05/18/2016   Lab Results  Component Value Date   K 4.4 05/18/2016    ASSESSMENT / PLAN: Problem List Items Addressed This Visit    Diastolic dysfunction without heart failure (Chronic)    Grade 3 diastolic dysfunction noted on echo last year, this is in the absence of any significant heart failure symptoms. She appears to be euvolemic. Would have a low threshold for titrating up ACE inhibitor dose. We are checking her blood pressure at the time of her upcoming echo. Would shoot for pressures more consistent with the 120s /70s then 130s /80s.      MITRAL REGURGITATION - status post MVR (Chronic)    She is due for he had again another echocardiogram. Last one showed grade 3 diastolic dysfunction, but she is not overly asymptomatic from this. She also appears to be euvolemic.  Mitral valve showed mild stenosis. We need to continue to follow this closely.      Relevant  Medications   diltiazem (CARDIZEM CD) 180 MG 24 hr capsule   lisinopril (PRINIVIL,ZESTRIL) 5 MG tablet   atenolol (TENORMIN) 50 MG tablet   Other Relevant Orders   EKG 12-Lead   Chronic atrial fibrillation (HCC) (Chronic)    Chronic underlying rhythm tends to be A. fib. She is on both atenolol and diltiazem with backup pacemaking. She does note a little bit of exertional fatigue, I would defer potentially backing down her rate control to Dr. Curt Bears when she is seen in follow-up. This may help some of  her exertional fatigue.  She is on warfarin for a coagulation for both A. fib and mitral valve. INR is followed by PCP.      Relevant Medications   diltiazem (CARDIZEM CD) 180 MG 24 hr capsule   lisinopril (PRINIVIL,ZESTRIL) 5 MG tablet   atenolol (TENORMIN) 50 MG tablet   Other Relevant Orders   EKG 12-Lead   Long term current use of anticoagulant therapy (Chronic)    On warfarin for both her mitral valve and A. fib. We talked about potentially converting to a DOAC however it would probably be off label based on the fact that she does have rheumatic valvular disease.      Hypertension associated with diabetes ([place]) (Chronic)    Pretty well-controlled blood pressure today on atenolol, diltiazem and lisinopril. She does have room to increase if her blood pressures go higher. Low threshold to increase to 10 mg based on her diastolic dysfunction.  I've asked that they check her blood pressure with a new echocardiogram coming up. This will give [place] another blood pressure check, as I wonder if that recent bleed last year was associated with less controlled hypertension resulting in a higher grade of diastolic dysfunction      Relevant Medications   diltiazem (CARDIZEM CD) 180 MG 24 hr capsule   lisinopril (PRINIVIL,ZESTRIL) 5 MG tablet   atenolol (TENORMIN) 50 MG tablet   Other Relevant Orders   EKG 12-Lead   S/P MVR (mitral valve repair) - Primary (Chronic)    The plan had been every 2  year check, however with the findings on the most recent echo, we are having another one done this year. Need to monitor mild mitral stenosis and significant diastolic dysfunction.      S/P placement of cardiac pacemaker (Chronic)    Most recent generator change was in May 2013. She is being monitored in the physician patient clinic with Dr. Curt Bears.         Current medicines are reviewed at length with the patient today. (+/- concerns) none The following changes have been made: none  Patient Instructions    SCHEDULE FOR 06/15/16 AT 9:30   ( [place]) CHECK BLOOD PRESSURE AT THE TIME OF ECHO AND DOCUMENT IN [place] physician has requested that you have an echocardiogram. Echocardiography is a painless test that uses sound waves to create images of your heart. It provides your doctor with information about the size and shape of your heart and how well your heart's chambers and valves are working. This procedure takes approximately one hour. There are no restrictions for this procedure.  MEDICATIONS REFILLED  Your physician wants you to follow-up in: [place].You will receive a reminder letter in the mail two months in advance. If you don't receive a letter, please call our office to schedule the follow-up appointment.  If you need a refill on your cardiac medications before your next appointment, please call your pharmacy.    Studies Ordered:  Cardiac valve Orders Placed This Encounter  Procedures  . EKG 12-Lead      Glenetta Hew, M.D., M.S. Interventional Cardiologist   Pager # [phone] Phone # [phone] [address] [place], [place] [address]

## 2016-06-01 ENCOUNTER — Encounter: Payer: Self-pay | Admitting: Cardiology

## 2016-06-01 NOTE — Assessment & Plan Note (Signed)
She is due for he had again another echocardiogram. Last one showed grade 3 diastolic dysfunction, but she is not overly asymptomatic from this. She also appears to be euvolemic.  Mitral valve showed mild stenosis. We need to continue to follow this closely.

## 2016-06-01 NOTE — Assessment & Plan Note (Signed)
On warfarin for both her mitral valve and A. fib. We talked about potentially converting to a DOAC however it would probably be off label based on the fact that she does have rheumatic valvular disease.

## 2016-06-01 NOTE — Assessment & Plan Note (Signed)
Grade 3 diastolic dysfunction noted on echo last year, this is in the absence of any significant heart failure symptoms. She appears to be euvolemic. Would have a low threshold for titrating up ACE inhibitor dose. We are checking her blood pressure at the time of her upcoming echo. Would shoot for pressures more consistent with the 120s /70s then 130s /80s.

## 2016-06-01 NOTE — Assessment & Plan Note (Addendum)
Pretty well-controlled blood pressure today on atenolol, diltiazem and lisinopril. She does have room to increase if her blood pressures go higher. Low threshold to increase to 10 mg based on her diastolic dysfunction.  I've asked that they check her blood pressure with a new echocardiogram coming up. This will give [place] another blood pressure check, as I wonder if that recent bleed last year was associated with less controlled hypertension resulting in a higher grade of diastolic dysfunction

## 2016-06-01 NOTE — Assessment & Plan Note (Signed)
The plan had been every 2 year check, however with the findings on the most recent echo, we are having another one done this year. Need to monitor mild mitral stenosis and significant diastolic dysfunction.

## 2016-06-01 NOTE — Assessment & Plan Note (Addendum)
Chronic underlying rhythm tends to be A. fib. She is on both atenolol and diltiazem with backup pacemaking. She does note a little bit of exertional fatigue, I would defer potentially backing down her rate control to Dr. Curt Bears when she is seen in follow-up. This may help some of her exertional fatigue.  She is on warfarin for a coagulation for both A. fib and mitral valve. INR is followed by PCP.

## 2016-06-01 NOTE — Assessment & Plan Note (Signed)
Most recent generator change was in May 2013. She is being monitored in the physician patient clinic with Dr. Curt Bears.

## 2016-06-07 ENCOUNTER — Encounter: Payer: Self-pay | Admitting: Cardiology

## 2016-06-15 ENCOUNTER — Ambulatory Visit (HOSPITAL_COMMUNITY): Payer: BLUE CROSS/BLUE SHIELD | Attending: Internal Medicine

## 2016-06-15 ENCOUNTER — Other Ambulatory Visit: Payer: Self-pay

## 2016-06-15 ENCOUNTER — Ambulatory Visit: Payer: BLUE CROSS/BLUE SHIELD

## 2016-06-15 DIAGNOSIS — I080 Rheumatic disorders of both mitral and aortic valves: Secondary | ICD-10-CM

## 2016-06-15 DIAGNOSIS — Z9889 Other specified postprocedural states: Secondary | ICD-10-CM | POA: Diagnosis not present

## 2016-06-15 DIAGNOSIS — I083 Combined rheumatic disorders of mitral, aortic and tricuspid valves: Secondary | ICD-10-CM | POA: Insufficient documentation

## 2016-06-19 ENCOUNTER — Encounter (INDEPENDENT_AMBULATORY_CARE_PROVIDER_SITE_OTHER): Payer: Self-pay

## 2016-06-19 ENCOUNTER — Ambulatory Visit (INDEPENDENT_AMBULATORY_CARE_PROVIDER_SITE_OTHER): Payer: BLUE CROSS/BLUE SHIELD | Admitting: Cardiology

## 2016-06-19 ENCOUNTER — Encounter: Payer: Self-pay | Admitting: Cardiology

## 2016-06-19 VITALS — BP 144/82 | HR 70 | Ht 64.0 in | Wt 157.4 lb

## 2016-06-19 DIAGNOSIS — I482 Chronic atrial fibrillation: Secondary | ICD-10-CM | POA: Diagnosis not present

## 2016-06-19 DIAGNOSIS — Z950 Presence of cardiac pacemaker: Secondary | ICD-10-CM | POA: Diagnosis not present

## 2016-06-19 DIAGNOSIS — I4821 Permanent atrial fibrillation: Secondary | ICD-10-CM

## 2016-06-19 LAB — CUP PACEART INCLINIC DEVICE CHECK
Battery Impedance: 567 Ohm
Battery Remaining Longevity: 86 mo
Battery Voltage: 2.8 V
Brady Statistic RV Percent Paced: 66 %
Implantable Lead Implant Date: 20100513
Implantable Lead Location: 753859
Implantable Lead Model: 4092
Implantable Lead Model: 4592
Lead Channel Sensing Intrinsic Amplitude: 22.4 mV
Lead Channel Setting Pacing Amplitude: 3 V
Lead Channel Setting Pacing Pulse Width: 0.4 ms
Lead Channel Setting Sensing Sensitivity: 5.6 mV
MDC IDC LEAD IMPLANT DT: 20100513
MDC IDC LEAD LOCATION: 753860
MDC IDC MSMT LEADCHNL RA IMPEDANCE VALUE: 67 Ohm
MDC IDC MSMT LEADCHNL RV IMPEDANCE VALUE: 540 Ohm
MDC IDC MSMT LEADCHNL RV PACING THRESHOLD AMPLITUDE: 1.5 V
MDC IDC MSMT LEADCHNL RV PACING THRESHOLD PULSEWIDTH: 0.4 ms
MDC IDC PG IMPLANT DT: 20100513
MDC IDC SESS DTM: 20171204155114

## 2016-06-19 NOTE — Progress Notes (Signed)
Electrophysiology Office Note   Date:  06/19/2016   ID:  Krystal Little, DOB 07-31-1955, MRN [id number]  PCP:  Bufford Lope, DO  Cardiologist:  Glenetta Hew Primary Electrophysiologist:  Krystal Meredith Leeds, MD    Chief Complaint  Patient presents with  . Pacemaker Check    Sick sinus syndrome      History of Present Illness: Krystal Little is a 60 y.o. female who presents today for electrophysiology evaluation.      She has a history of rheumatic heart disease the resulted in marked moderate to severe mitral regurgitation or prolapse. In December 2000 she presented with syncope and sick sinus syndrome. She underwent mitral valve repair with a ring annuloplasty and transposition of chordae followed by pacemaker placement. She has chronic atrial fibrillation as well as atrial flutter with an attempted ablation of complex atrial flutter awake forest. She has tried multiple antiarrhythmics and failed Sotalol and  Dofetilide.   Today, she denies symptoms of palpitations, chest pain, shortness of breath, orthopnea, PND, lower extremity edema, claudication, dizziness, presyncope, syncope, bleeding, or neurologic sequela. The patient is tolerating medications without difficulties and is otherwise without complaint today.    Past Medical History:  Diagnosis Date  . Chronic atrial fibrillation ([place]) 1990s   s/p DCCV then attempted ablation of complex A Flutter (@ [facility] - Dr. Deno Little), failed antiarrhythmics --> INR folllwed @ [facility]  . Diabetes mellitus type 2, uncontrolled, without complications (HCC)    On Oral Medications ([facility])  . Essential hypertension   . History of cardiac catheterization 2001   R&LHC - normal Coronaries, no evidence of Restictive Cardiomyopathy or Constrictive Pericarditis (also @ [facility])  . Hx of sick sinus syndrome 07/1999   Wtih symptomatic bradycardia - syncope ([place])  . S/P MVR (mitral valve repair) 08/09/1999   H/o Rheumativ MV disease  with Proloapse & Mod-Severe MR --> Ant&Post Leaflet resection/repair wiht Ring Annuloplasty;; Echo 9/'14: MV ring prosthesis well seated, mild restriction of Post MV leaflet, Mlid MR w/o MS, EF 50-55% - Gr1 DD, severe LA dilation, Mod-severe RA dilation, trivial AI & Mod TR (PAP ~35 mmHg)  . 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 CARDIAC CATHETERIZATION  08/03/1999   [place] - normal Coronaries; no sign of constriction or restrictive cardiomypathy  . MITRAL VALVE REPAIR  07/1999   Both Ant& Post leaflet repair - quadrangular resection&caudal transposition, #32 Sequin Annuloplasty ring  . PACEMAKER GENERATOR CHANGE  11/26/2008   Medtronic Adapta L(? if it has been checked since 02/2013)  . PACEMAKER INSERTION  07/1999   for SSS in setting of Chronic Afib; Medtronic Kappa [id number], [id number] H.  . TRANSTHORACIC ECHOCARDIOGRAM  03/21/2013     Current Outpatient Prescriptions  Medication Sig Dispense Refill  . atenolol (TENORMIN) 50 MG tablet Take 1 tablet (50 mg total) by mouth daily. 90 tablet 3  . diltiazem (CARDIZEM CD) 180 MG 24 hr capsule Take 1 capsule (180 mg total) by mouth daily. 90 capsule 3  . glimepiride (AMARYL) 2 MG tablet TAKE ONE TABLET BY MOUTH ONCE DAILY BEFORE BREAKFAST 90 tablet 0  . lisinopril (PRINIVIL,ZESTRIL) 5 MG tablet Take 1 tablet (5 mg total) by mouth daily. 90 tablet 3  . metFORMIN (GLUCOPHAGE-XR) 500 MG 24 hr tablet Take 1 tablet (500 mg total) by mouth 2 (two) times daily with a meal. 180 tablet 5  . warfarin (COUMADIN) 5 MG tablet TAKE ONE  TABLET BY MOUTH AS  DIRECTED 90 tablet 0   No current facility-administered medications for this visit.     Allergies:   Patient has no known allergies.   Social History:  The patient  reports that she has never smoked. She has never used smokeless tobacco. She reports that she drinks alcohol. She reports that she does not use drugs.   Family History:  The patient's  family history includes Diabetes in her sister; Hypertension in her mother; Lung cancer in her brother.    ROS:  Please see the history of present illness.   All other systems are reviewed and positive for cough.   PHYSICAL EXAM: VS:  BP (!) 144/82   Pulse 70   Ht 5\' 4"  (1.626 m)   Wt 157 lb 6.4 oz (71.4 kg)   BMI 27.02 kg/m  , BMI Body mass index is 27.02 kg/m. GEN: Well nourished, well developed, in no acute distress  HEENT: normal  Neck: no JVD, carotid bruits, or masses Cardiac: RRR; no murmurs, rubs, or gallops,no edema  Respiratory:  clear to auscultation bilaterally, normal work of breathing GI: soft, nontender, nondistended, + BS MS: no deformity or atrophy  Skin: warm and dry, device pocket is well healed Neuro:  Strength and sensation are intact Psych: euthymic mood, full affect  EKG:  EKG is not ordered today. Personal review of the ekg ordered 05/31/16 shows atrial fibrillation with intermittent V pacing  Recent Labs: 05/18/2016: BUN 12; Creat 0.73; Hemoglobin 13.6; Platelets 164; Potassium 4.4; Sodium 141    Lipid Panel     Component Value Date/Time   CHOL 176 05/18/2016 0919   TRIG 87 05/18/2016 0919   HDL 52 05/18/2016 0919   CHOLHDL 3.4 05/18/2016 0919   VLDL 17 05/18/2016 0919   LDLCALC 107 05/18/2016 0919   LDLDIRECT 107 (H) 10/02/2012 1018     Wt Readings from Last 3 Encounters:  06/19/16 157 lb 6.4 oz (71.4 kg)  05/31/16 157 lb 12.8 oz (71.6 kg)  05/18/16 160 lb (72.6 kg)      Other studies Reviewed: Additional studies/ records that were reviewed today include: TTE 06/12/15  Review of the above records today demonstrates:  - Left ventricle: The cavity size was normal. There was mild concentric hypertrophy. Systolic function was normal. The estimated ejection fraction was in the range of 55% to 60%. Wall motion was normal; there were no regional wall motion abnormalities. Doppler parameters are consistent with a reversible  restrictive pattern, indicative of decreased left ventricular diastolic compliance and/or increased left atrial pressure (grade 3 diastolic dysfunction). Doppler parameters are consistent with high ventricular filling pressure. - Aortic valve: Calcified annulus. Trileaflet; normal thickness leaflets. There was trivial regurgitation. - Mitral valve: Calcified annulus. Mobility of the posterior leaflet was restricted. The findings are consistent with mild stenosis. There was mild regurgitation. Mean gradient (D): 6 mm Hg. Valve area by pressure half-time: 1.96 cm^2. - Left atrium: The atrium was severely dilated. - Right ventricle: The cavity size was mildly to moderately dilated. Wall thickness was normal. Systolic function was moderately reduced. - Right atrium: The atrium was mildly dilated. - Atrial septum: No defect or patent foramen ovale was identified. - Tricuspid valve: There was moderate regurgitation. - Inferior vena cava: The vessel was dilated. The respirophasic diameter changes were blunted (< 50%), consistent with elevated central venous pressure.  ASSESSMENT AND PLAN:  1.  Atrial fibrillation: on coumadin for MVR as well as atrial fibrillation.  Krystal need life  long anticoagulation at this point.  She is not symptomatic from her AF at this time.    This patients CHA2DS2-VASc Score and unadjusted Ischemic Stroke Rate (% per year) is equal to 3.2 % stroke rate/year from a score of 3  Above score calculated as 1 point each if present [CHF, HTN, DM, Vascular=MI/PAD/Aortic Plaque, Age if 65-74, or Female] Above score calculated as 2 points each if present [Age > 75, or Stroke/TIA/TE]  2.  Sick sinus syndrome s/p pacemaker: functioning well without issues.  Krystal continue current care for her pacemaker.  No changes were made today. We'll set her up for remote monitoring.  3. Mitral valve replacement: mild stenosis on TTE.  Continue to follow.  3.  Hypertension: Currently blood pressure is elevated mildly. She would like to discuss this further with her primary physician.  Current medicines are reviewed at length with the patient today.   The patient does not have concerns regarding her medicines.  The following changes were made today:  none  Labs/ tests ordered today include:  No orders of the defined types were placed in this encounter.    Disposition:   FU with Krystal Camnitz 1 year  Signed, Krystal Meredith Leeds, MD  06/19/2016 9:44 AM     [facility] [address] [place] [address] [phone] (office) [phone] (fax)

## 2016-06-19 NOTE — Patient Instructions (Signed)
Medication Instructions:    Your physician recommends that you continue on your current medications as directed. Please refer to the Current Medication list given to you today.  --- If you need a refill on your cardiac medications before your next appointment, please call your pharmacy. 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 This monitoring reduces the number of office visits required to check your device to one time per year. It allows [place] to keep an eye on the functioning of your device to ensure it is working properly. You are scheduled for a device check from home on 09/18/2016. You may send your transmission at any time that day. If you have a wireless device, the transmission will be sent automatically. After your physician reviews your transmission, you will receive a postcard with your next transmission date.   Your physician wants you to follow-up in: 1 year with Dr. Curt Bears.  You will receive a reminder letter in the mail two months in advance. If you don't receive a letter, please call our office to schedule the follow-up appointment.   Thank you for choosing [facility]!!   Trinidad Curet, RN [phone]

## 2016-06-21 ENCOUNTER — Telehealth: Payer: Self-pay | Admitting: *Deleted

## 2016-06-21 NOTE — Telephone Encounter (Signed)
Open error 

## 2016-06-26 ENCOUNTER — Other Ambulatory Visit: Payer: Self-pay | Admitting: *Deleted

## 2016-06-26 DIAGNOSIS — Z7901 Long term (current) use of anticoagulants: Secondary | ICD-10-CM

## 2016-06-26 MED ORDER — WARFARIN SODIUM 5 MG PO TABS: ORAL_TABLET | ORAL | 0 refills | Status: DC

## 2016-07-28 ENCOUNTER — Other Ambulatory Visit: Payer: Self-pay | Admitting: Family Medicine

## 2016-07-28 DIAGNOSIS — Z7901 Long term (current) use of anticoagulants: Secondary | ICD-10-CM

## 2016-07-28 NOTE — Telephone Encounter (Signed)
Needs refill on warfarin. Has appt for coumadin check on Monday.  [facility] on [address]

## 2016-07-31 ENCOUNTER — Ambulatory Visit: Payer: BLUE CROSS/BLUE SHIELD

## 2016-07-31 MED ORDER — WARFARIN SODIUM 5 MG PO TABS: ORAL_TABLET | ORAL | 0 refills | Status: DC

## 2016-08-22 ENCOUNTER — Other Ambulatory Visit: Payer: Self-pay | Admitting: Family Medicine

## 2016-08-23 ENCOUNTER — Other Ambulatory Visit: Payer: Self-pay | Admitting: Family Medicine

## 2016-08-23 DIAGNOSIS — Z7901 Long term (current) use of anticoagulants: Secondary | ICD-10-CM

## 2016-08-23 NOTE — Telephone Encounter (Signed)
Patient said she was planning on coming in tomorrow for an INR check, so I added her to the lab schedule and also scheduled her to 09-25-16 to see PCP.  She prefers morning times and this was the next available.  Kaymen Adrian,CMA

## 2016-08-23 NOTE — Telephone Encounter (Signed)
Please ask patient to make appointment to be seen for diabetes and for INR check. Will give one month refill.

## 2016-08-24 ENCOUNTER — Ambulatory Visit (INDEPENDENT_AMBULATORY_CARE_PROVIDER_SITE_OTHER): Payer: BLUE CROSS/BLUE SHIELD | Admitting: *Deleted

## 2016-08-24 DIAGNOSIS — Z7901 Long term (current) use of anticoagulants: Secondary | ICD-10-CM | POA: Diagnosis not present

## 2016-08-24 DIAGNOSIS — I482 Chronic atrial fibrillation, unspecified: Secondary | ICD-10-CM

## 2016-08-24 LAB — POCT INR: INR: 1.9

## 2016-09-01 ENCOUNTER — Other Ambulatory Visit: Payer: Self-pay | Admitting: Cardiology

## 2016-09-01 NOTE — Telephone Encounter (Signed)
Atenolol refilled for 1 year supply 05/31/16

## 2016-09-05 ENCOUNTER — Other Ambulatory Visit: Payer: Self-pay | Admitting: Family Medicine

## 2016-09-05 DIAGNOSIS — Z7901 Long term (current) use of anticoagulants: Secondary | ICD-10-CM

## 2016-09-06 ENCOUNTER — Encounter: Payer: Self-pay | Admitting: Obstetrics and Gynecology

## 2016-09-06 ENCOUNTER — Ambulatory Visit (INDEPENDENT_AMBULATORY_CARE_PROVIDER_SITE_OTHER): Payer: BLUE CROSS/BLUE SHIELD | Admitting: Obstetrics and Gynecology

## 2016-09-06 VITALS — BP 122/74 | HR 82 | Temp 98.3°F | Wt 150.0 lb

## 2016-09-06 DIAGNOSIS — R519 Headache, unspecified: Secondary | ICD-10-CM

## 2016-09-06 DIAGNOSIS — R51 Headache: Secondary | ICD-10-CM

## 2016-09-06 NOTE — Progress Notes (Signed)
   Subjective:   Patient ID: Krystal ACHEY, female    DOB: Jun 13, 1956, 61 y.o.   MRN: [id number]  Patient presents for Same Day Appointment  Chief Complaint  Patient presents with  . Headache    HPI: #HEADACHE Headache started 1 week ago Waking up with daily headaches Gradually improving Rates it 4/10 currently Location: left frontal and around eye Denies throbbing nature to pain just a dull ache Medications tried: Tylenol helps  Head trauma: no Previous similar headaches: yes, h/o migraines  Taking blood thinners: yes warfarin  Has had some increased stress recently with car buying  Symptoms Nose congestion stuffiness:  no Nausea vomiting: no Noise sensitivity: no Vision changes: yes Fever: no Neck Stiffness: no Trouble walking or speaking: no  Review of Systems   See HPI for ROS.   History  Smoking Status  . Never Smoker  Smokeless Tobacco  . Never Used    Past medical history, surgical, family, and social history reviewed and updated in the EMR as appropriate.  Pertinent Historical Findings include: chronic A.fib, DM, HTN, pacemaker Objective:  BP 122/74   Pulse 82   Temp 98.3 F (36.8 C) (Oral)   Wt 150 lb (68 kg)   SpO2 95%   BMI 25.75 kg/m  Vitals and nursing note reviewed  Physical Exam  Constitutional: She is well-developed, well-nourished, and in no distress.  HENT:  Head: Normocephalic and atraumatic.  Nose: Right sinus exhibits no maxillary sinus tenderness and no frontal sinus tenderness. Left sinus exhibits no maxillary sinus tenderness and no frontal sinus tenderness.  Mouth/Throat: Oropharynx is clear and moist.  Eyes: EOM are normal. Pupils are equal, round, and reactive to light.  Neck: Normal range of motion. Neck supple.  Cardiovascular: Normal rate and regular rhythm.   Pulmonary/Chest: Effort normal and breath sounds normal.  Neurological: No cranial nerve deficit.    Assessment & Plan:  1. Acute nonintractable headache,  unspecified headache type Most likely migraine. No red flags. Conservative treatment. Patient to continue tylenol prn for headache. Avoid ASA since on warfarin. Handout given. Counseled patient on decreasing stress level.   Diagnosis and plan were discussed in detail with this patient today. The patient verbalized understanding and agreed with the plan. Patient advised if symptoms worsen return to clinic or ER.   PATIENT EDUCATION PROVIDED: See AVS   Luiz Blare, DO 09/06/2016, 11:34 AM PGY-3, [place]

## 2016-09-06 NOTE — Patient Instructions (Addendum)

## 2016-09-07 NOTE — Telephone Encounter (Signed)
Attempted to call patient to discuss switching to a different anticoagulant since patient has been subtherapeutic and inconsistent with attending coumadin clinic. Patient did not pick up so left a message for the patient to call back.

## 2016-09-12 NOTE — Telephone Encounter (Signed)
Pt was returning Dr. Lora Havens call. Pt leaves at 1pm today and tomorrow for work so please call back before 1pm. ep

## 2016-09-13 ENCOUNTER — Telehealth: Payer: Self-pay | Admitting: Family Medicine

## 2016-09-13 NOTE — Telephone Encounter (Signed)
Spoke with patient that given the infrequent INR checks and our inability to coordinate this despite multiple attempts (patient and I have discussed in multiple clinic visits that need to be back for 2 week INR check with a 1 month follow up visit, patient will not come back until 3 months for both her lab draw and her follow up visit), that I do not feel comfortable with keeping her on warfarin at this time. Patient states that she is wary of DOACs and would prefer to stay on warfarin "because it has worked well for me so far." I explained that although she has been on the medication for years and although she states she has been compliant, that her INR has actually frequently been subtherapeutic. I reiterated the risk/benefits of a DOAC that we had discussed at her last clinic visit with me. Patient still persistent in that she would like to stay on warfarin therefore, I advised she speak with her cardiologist regarding her preferences.

## 2016-09-18 ENCOUNTER — Encounter: Payer: BLUE CROSS/BLUE SHIELD | Admitting: *Deleted

## 2016-09-18 ENCOUNTER — Telehealth: Payer: Self-pay | Admitting: Cardiology

## 2016-09-18 NOTE — Telephone Encounter (Signed)
Spoke with pt and reminded pt of remote transmission that is due today. Pt verbalized understanding.   

## 2016-09-22 ENCOUNTER — Encounter: Payer: Self-pay | Admitting: Cardiology

## 2016-09-22 NOTE — Progress Notes (Signed)
Letter  

## 2016-09-25 ENCOUNTER — Encounter: Payer: Self-pay | Admitting: Family Medicine

## 2016-09-25 ENCOUNTER — Ambulatory Visit (INDEPENDENT_AMBULATORY_CARE_PROVIDER_SITE_OTHER): Payer: BLUE CROSS/BLUE SHIELD | Admitting: Family Medicine

## 2016-09-25 ENCOUNTER — Ambulatory Visit (INDEPENDENT_AMBULATORY_CARE_PROVIDER_SITE_OTHER): Payer: BLUE CROSS/BLUE SHIELD | Admitting: *Deleted

## 2016-09-25 VITALS — BP 118/70 | HR 78 | Temp 98.2°F | Wt 154.0 lb

## 2016-09-25 DIAGNOSIS — E1169 Type 2 diabetes mellitus with other specified complication: Secondary | ICD-10-CM | POA: Insufficient documentation

## 2016-09-25 DIAGNOSIS — E785 Hyperlipidemia, unspecified: Secondary | ICD-10-CM

## 2016-09-25 DIAGNOSIS — IMO0001 Reserved for inherently not codable concepts without codable children: Secondary | ICD-10-CM

## 2016-09-25 DIAGNOSIS — E1165 Type 2 diabetes mellitus with hyperglycemia: Secondary | ICD-10-CM | POA: Diagnosis not present

## 2016-09-25 DIAGNOSIS — I482 Chronic atrial fibrillation: Secondary | ICD-10-CM | POA: Diagnosis not present

## 2016-09-25 DIAGNOSIS — E119 Type 2 diabetes mellitus without complications: Secondary | ICD-10-CM | POA: Diagnosis not present

## 2016-09-25 DIAGNOSIS — Z7901 Long term (current) use of anticoagulants: Secondary | ICD-10-CM

## 2016-09-25 LAB — POCT INR: INR: 1.8

## 2016-09-25 LAB — POCT GLYCOSYLATED HEMOGLOBIN (HGB A1C): Hemoglobin A1C: 7.1

## 2016-09-25 MED ORDER — WARFARIN SODIUM 5 MG PO TABS: 5.0000 mg | ORAL_TABLET | Freq: Every day | ORAL | 0 refills | Status: DC

## 2016-09-25 NOTE — Progress Notes (Signed)
    Subjective:  Krystal Little is a 61 y.o. female who presents to the [facility] today for atrial fib on coumadin and diabetes follow up.  HPI: Diabetes - States had tried metformin 500mg  BID for approximately 2 weeks without issues, was tolerating well. Then had a migraine headache and was concerned that her increased metformin could have contributed so decided to back down to previous dose of 500mg  daily. - Has been eating better lately. Lunches every day have been salads. - Diabetic Review of Systems: no polyuria or polydipsia, no chest pain, dyspnea or TIA's, no numbness, tingling or pain in extremities, no unusual visual symptoms, no medication side effects noted.    Coumadin for Atrial fibrillation with mitral valve repair - Is very resistant to DOACs given her reading of their side effect profiles.  - States had transportation issues so was unable to make the lab visits but recently got a car so now states she will be able to commit to at least a monthly INR check. Is amenable to getting her prescriptions on paper at monthly follow up visits. - Has been compliant on coumadin 5mg  daily and is consistent with greens she eats daily  Hyperlipidemia - States does not ever recall being on statin in the past, was prescribed to her in 2015 but states she never picked it up. - Patient had read that statin can cause a worsening of her diabetes and is very resistant at this time.  ROS: Per HPI  Objective:  Physical Exam: BP 118/70   Pulse 78   Temp 98.2 F (36.8 C) (Oral)   Wt 154 lb (69.9 kg)   SpO2 97%   BMI 26.43 kg/m   Gen: NAD, resting comfortably CV: regular, normal S1 and S2, soft grade II systolic murmur at LLSB. No rubs or gallops. Pulm: NWOB, CTAB with no crackles, wheezes, or rhonchi GI: Normal bowel sounds present. Soft, Nontender, Nondistended. MSK: no edema, cyanosis, or clubbing noted Skin: warm, dry Neuro: grossly normal, moves all extremities Psych: Normal affect and  thought content  Results for orders placed or performed in visit on 09/25/16 (from the past 72 hour(s))  POCT INR     Status: None   Collection Time: 09/25/16 10:34 AM  Result Value Ref Range   INR 1.8      Assessment/Plan:  Long term current use of anticoagulant therapy CHADSsVASC 2 and subtherapeutic again today at 1.8 on coumadin 5mg  daily for afib and mitral regurg s/p mitral valve repair. Patient will take 10mg  today and then resume her 5mg  daily with plan for recheck in 2 weeks. Some concerns for patient noncompliance with INR checks but patient will come for monthly follow ups with paper prescriptions for coumadin.   Diabetes mellitus type 2, controlled, without complications ([place]) G5Q improved to 7.1 from 8.1 in September 2017. Patient to continue metformin 500mg  BID and glimepiride 2mg  daily.   Hyperlipidemia associated with type 2 diabetes mellitus ([place])  Given risk factors and ASCVD risk 12.9%, patient would benefit from being on high intensity statin but declined, despite a long discussion of its benefits.   Bufford Lope, DO PGY-1, [facility] 09/25/2016 9:49 AM

## 2016-09-25 NOTE — Assessment & Plan Note (Signed)
CHADSsVASC 2 and subtherapeutic again today at 1.8 on coumadin 5mg  daily for afib and mitral regurg s/p mitral valve repair. Patient will take 10mg  today and then resume her 5mg  daily with plan for recheck in 2 weeks. Some concerns for patient noncompliance with INR checks but patient will come for monthly follow ups with paper prescriptions for coumadin.

## 2016-09-25 NOTE — Patient Instructions (Signed)
It was great to see you again!  For your Atrial Fibrillation,  - Please take 2 tablets of your coumadin today (10mg  total today), then resume taking 5mg  (1 tablet daily). - Return for lab visit in 2 weeks. - Return for follow up visit with me in 1 month. I will provide you with paper prescriptions for your coumadin to make sure we stay on top of checking your INR monthly.  For your diabetes - Continue taking glimepiride 2mg  daily as you have been. - Please take metformin 500mg  once in the morning and once in the evening as we discussed today.  - Please get your a1c checked at our lab before you leave today.  For your cholesterol. - I strongly recommend that we start atorvastatin 40mg  daily. We will discussed more at your next visit in 1 month.  We are checking some labs today, and someone will call you or send you a letter with the results when they are available.   Take care and seek immediate care sooner if you develop any concerns.   Dr. Bufford Lope, [place]

## 2016-09-25 NOTE — Assessment & Plan Note (Addendum)
a1c improved to 7.1 from 8.1 in September 2017. Patient to continue metformin 500mg  BID and glimepiride 2mg  daily.

## 2016-09-25 NOTE — Assessment & Plan Note (Signed)
Given risk factors and ASCVD risk 12.9%, patient would benefit from being on high intensity statin but declined, despite a long discussion of its benefits.

## 2016-10-09 ENCOUNTER — Ambulatory Visit (INDEPENDENT_AMBULATORY_CARE_PROVIDER_SITE_OTHER): Payer: BLUE CROSS/BLUE SHIELD | Admitting: *Deleted

## 2016-10-09 DIAGNOSIS — I482 Chronic atrial fibrillation, unspecified: Secondary | ICD-10-CM

## 2016-10-09 DIAGNOSIS — Z7901 Long term (current) use of anticoagulants: Secondary | ICD-10-CM

## 2016-10-09 LAB — POCT INR: INR: 1.8

## 2016-10-09 MED ORDER — WARFARIN SODIUM 5 MG PO TABS: ORAL_TABLET | ORAL | 0 refills | Status: DC

## 2016-10-09 NOTE — Progress Notes (Signed)
Called patient to change her coumadin dosing. Had been on 5mg  daily but INR persistently subtherapeutic around 1.8-1.9 range. Original plan was to increase to 10mg  in Tuesdays with 5mg  on other days. However would like to change if patient is agreeable to 7.5mg  twice a week (either Mon/Fri or Tues/Sat) with 5mg  on the other days of the week. Patient was not available so left message asking her to call back.

## 2016-10-09 NOTE — Addendum Note (Signed)
Addended by: Bufford Lope on: 10/09/2016 03:45 PM   Modules accepted: Orders

## 2016-10-23 ENCOUNTER — Ambulatory Visit (INDEPENDENT_AMBULATORY_CARE_PROVIDER_SITE_OTHER): Payer: BLUE CROSS/BLUE SHIELD | Admitting: *Deleted

## 2016-10-23 DIAGNOSIS — Z7901 Long term (current) use of anticoagulants: Secondary | ICD-10-CM | POA: Diagnosis not present

## 2016-10-23 DIAGNOSIS — I482 Chronic atrial fibrillation, unspecified: Secondary | ICD-10-CM

## 2016-10-23 LAB — POCT INR: INR: 2.3

## 2016-11-02 ENCOUNTER — Telehealth: Payer: Self-pay | Admitting: Family Medicine

## 2016-11-02 DIAGNOSIS — Z7901 Long term (current) use of anticoagulants: Secondary | ICD-10-CM

## 2016-11-02 MED ORDER — WARFARIN SODIUM 5 MG PO TABS: ORAL_TABLET | ORAL | 0 refills | Status: DC

## 2016-11-02 NOTE — Telephone Encounter (Signed)
Will give refill to make it to her next appointment on 11/14/16.

## 2016-11-02 NOTE — Telephone Encounter (Signed)
Pt  calling to request refill of:  Name of Medication(s): warfarin  Last date of OV: [id number] Pharmacy:[facility] on [address].  She would like to pick it up today  Will route refill request to Clinic RN.  Discussed with patient policy to call pharmacy for future refills.  Also, discussed refills may take up to 48 hours to approve or deny.  Roseanna Rainbow

## 2016-11-06 ENCOUNTER — Ambulatory Visit (INDEPENDENT_AMBULATORY_CARE_PROVIDER_SITE_OTHER): Payer: BLUE CROSS/BLUE SHIELD | Admitting: *Deleted

## 2016-11-06 DIAGNOSIS — Z7901 Long term (current) use of anticoagulants: Secondary | ICD-10-CM | POA: Diagnosis not present

## 2016-11-06 DIAGNOSIS — I482 Chronic atrial fibrillation, unspecified: Secondary | ICD-10-CM

## 2016-11-06 LAB — POCT INR: INR: 2.7

## 2016-11-14 ENCOUNTER — Encounter: Payer: Self-pay | Admitting: Family Medicine

## 2016-11-14 ENCOUNTER — Ambulatory Visit (INDEPENDENT_AMBULATORY_CARE_PROVIDER_SITE_OTHER): Payer: BLUE CROSS/BLUE SHIELD | Admitting: Family Medicine

## 2016-11-14 VITALS — BP 130/76 | HR 68 | Temp 97.8°F | Ht 64.0 in | Wt 150.4 lb

## 2016-11-14 DIAGNOSIS — Z7901 Long term (current) use of anticoagulants: Secondary | ICD-10-CM | POA: Diagnosis not present

## 2016-11-14 DIAGNOSIS — E119 Type 2 diabetes mellitus without complications: Secondary | ICD-10-CM | POA: Diagnosis not present

## 2016-11-14 MED ORDER — WARFARIN SODIUM 5 MG PO TABS: ORAL_TABLET | ORAL | 1 refills | Status: DC

## 2016-11-14 NOTE — Assessment & Plan Note (Signed)
Last 2 INR checks in therapeutic range. Goal between 2-3. - Continue current coumadin dosing: 5mg  daily with 10mg  on Tuesdays - Can go to monthly checks, has next lab visit appt set up. - Clarified with patient that can consume greens but must eat consistent amount daily.

## 2016-11-14 NOTE — Assessment & Plan Note (Signed)
Controlled and doing well on current regimen. - Continue metformin 500mg  BID and glimepiride 2mg  qd. - Discussed healthy diet and exercise.

## 2016-11-14 NOTE — Progress Notes (Signed)
    Subjective:  Krystal Little is a 61 y.o. female who presents to the [facility] today for diabetes follow up.  HPI:  Diabetes  - States compliant on metformin 500mg  BID most of the time. Approximately once per week will forget her evening dose. Occasionally has diarrhea if she does not take her metformin with food but this is rare. - Is compliant on glimepiride 2mg  qd, tolerating well. No symptoms of hypoglycemia. - Diet: used to eat salads but then thought her INR of 2.7 was too high so started cutting back on greens. - Exercise: has not been exercising much, went for a 1.23mile walk last week and enjoyed it, is planning on making it a part of her daily morning routine. - Diabetic Review of Systems: no polyuria or polydipsia, no chest pain, dyspnea or TIA's, no numbness, tingling or pain in extremities, no unusual visual symptoms, no medication side effects noted.    Coumadin for Atrial fibrillation with mitral valve repair -  Has ben compliant with coumadin 5mg  every daily with 10mg  on Tuesdays. Tolerating well.  - Denies bruising, lightheadedness,   ROS: Per HPI  Objective:  Physical Exam: BP 130/76 (BP Location: Right Arm)   Pulse 68   Temp 97.8 F (36.6 C) (Oral)   Ht 5\' 4"  (1.626 m)   Wt 150 lb 6.4 oz (68.2 kg)   SpO2 96%   BMI 25.82 kg/m   Gen: NAD, resting comfortably CV: RRR with no murmurs appreciated Pulm: NWOB, CTAB with no crackles, wheezes, or rhonchi GI: Normal bowel sounds present. Soft, Nontender, Nondistended. MSK: no edema, cyanosis, or clubbing noted Skin: warm, dry Neuro: grossly normal, moves all extremities Psych: Normal affect and thought content  INR  Date Value Ref Range Status  10/09/16 1.8  Final  10/23/2016 2.3  Final  11/06/2016 2.7  Final   Assessment/Plan:  Diabetes mellitus type 2, controlled, without complications ([place]) Controlled and doing well on current regimen. - Continue metformin 500mg  BID and glimepiride 2mg  qd. - Discussed  healthy diet and exercise.   Long term current use of anticoagulant therapy Last 2 INR checks in therapeutic range. Goal between 2-3. - Continue current coumadin dosing: 5mg  daily with 10mg  on Tuesdays - Can go to monthly checks, has next lab visit appt set up. - Clarified with patient that can consume greens but must eat consistent amount daily.  Follow up in 1 month for annual wellness and pap smear.  Bufford Lope, DO PGY-1, [facility] 11/14/2016 10:06 AM

## 2016-11-14 NOTE — Patient Instructions (Signed)
It was great to see you again!  For your diabetes,  - Keep taking your metformin and glimepiride. - Keep up the good work on your healthy diet and exercise!   Your coumadin dosing is good where it is, no changes today.   I wil lsee you back in 1 month for pap smear.  Take care and seek immediate care sooner if you develop any concerns.   Dr. Bufford Lope, [place]

## 2016-11-25 ENCOUNTER — Other Ambulatory Visit: Payer: Self-pay | Admitting: Family Medicine

## 2016-12-04 ENCOUNTER — Ambulatory Visit: Payer: BLUE CROSS/BLUE SHIELD

## 2016-12-12 NOTE — Progress Notes (Deleted)
    Subjective:  Krystal Little is a 61 y.o. female who presents to the [facility] today with a chief complaint of ***.   HPI:   ***HIST  Objective:  Physical Exam: There were no vitals taken for this visit.  Gen: ***NAD, resting comfortably CV: RRR with no murmurs appreciated Pulm: NWOB, CTAB with no crackles, wheezes, or rhonchi GI: Normal bowel sounds present. Soft, Nontender, Nondistended. MSK: no edema, cyanosis, or clubbing noted Skin: warm, dry Neuro: grossly normal, moves all extremities Psych: Normal affect and thought content  No results found for this or any previous visit (from the past 72 hour(s)).   Assessment/Plan:  No problem-specific Assessment & Plan notes found for this encounter.   Bufford Lope, DO PGY-***, [facility] 12/12/2016 3:03 PM

## 2016-12-13 ENCOUNTER — Encounter: Payer: Self-pay | Admitting: Family Medicine

## 2016-12-13 ENCOUNTER — Other Ambulatory Visit (HOSPITAL_COMMUNITY)
Admission: RE | Admit: 2016-12-13 | Discharge: 2016-12-13 | Disposition: A | Discharge status: Home / Self Care | Payer: BLUE CROSS/BLUE SHIELD | Source: Ambulatory Visit | Attending: Family Medicine | Admitting: Family Medicine

## 2016-12-13 ENCOUNTER — Ambulatory Visit: Payer: BLUE CROSS/BLUE SHIELD | Admitting: Family Medicine

## 2016-12-13 ENCOUNTER — Ambulatory Visit (INDEPENDENT_AMBULATORY_CARE_PROVIDER_SITE_OTHER): Payer: BLUE CROSS/BLUE SHIELD | Admitting: Family Medicine

## 2016-12-13 VITALS — BP 130/70 | HR 67 | Temp 98.5°F | Wt 151.8 lb

## 2016-12-13 DIAGNOSIS — Z124 Encounter for screening for malignant neoplasm of cervix: Secondary | ICD-10-CM | POA: Insufficient documentation

## 2016-12-13 DIAGNOSIS — Z1211 Encounter for screening for malignant neoplasm of colon: Secondary | ICD-10-CM

## 2016-12-13 DIAGNOSIS — I10 Essential (primary) hypertension: Secondary | ICD-10-CM | POA: Diagnosis not present

## 2016-12-13 DIAGNOSIS — E1159 Type 2 diabetes mellitus with other circulatory complications: Secondary | ICD-10-CM

## 2016-12-13 DIAGNOSIS — N898 Other specified noninflammatory disorders of vagina: Secondary | ICD-10-CM | POA: Diagnosis not present

## 2016-12-13 DIAGNOSIS — Z0000 Encounter for general adult medical examination without abnormal findings: Secondary | ICD-10-CM

## 2016-12-13 DIAGNOSIS — I152 Hypertension secondary to endocrine disorders: Secondary | ICD-10-CM

## 2016-12-13 DIAGNOSIS — E119 Type 2 diabetes mellitus without complications: Secondary | ICD-10-CM

## 2016-12-13 DIAGNOSIS — Z7901 Long term (current) use of anticoagulants: Secondary | ICD-10-CM

## 2016-12-13 LAB — POCT INR: INR: 1.9

## 2016-12-13 NOTE — Assessment & Plan Note (Signed)
Controlled on current regimen. Followed by cardiology.  - Continue atenolol, diltiazem, lisinopril

## 2016-12-13 NOTE — Assessment & Plan Note (Signed)
-   Referral for colonoscopy placed today - Refused mammogram - Pap collected today

## 2016-12-13 NOTE — Assessment & Plan Note (Signed)
INR slightly subtherapeutic today at 1.9, goal between 2-3. Likely d/t increased intake of greens. Reviewed importance of consistent intake today. - Coumadin dosing: 5mg  qd with 7.5mg  twice a week on Mondays and Fridays. - Monthly lab visits for INR checks - Continue rate control per cardiology.

## 2016-12-13 NOTE — Progress Notes (Signed)
    Subjective:  Krystal Little is a 61 y.o. female who presents to the [facility] today for annual wellness visit.  HPI:  #Annual wellness - Eating more greens lately. - Exercise: walking 2-3x per week at park, daughter runs and has been a good influence in becoming more active - Would like pap today. Denies vaginal bleeding or discharge. - Agreeable to colonoscopy - Had a bad experience with mammogram in the past and declines screening.  #DM - Compliant on metformin 500mg  BID, glimipiride 2mg  qd. Tolerating well.  - Last a1c 7.1 on 09/25/16 - Last eye exam 11/03/2015 [facility]  #HTN - Compliant on atenolol, diltiazem, lisinopril. Tolerating well - no CP, SOB, HA, vision changes.  #Chronic anticoag for mitral valve repair, afib - INR today 1.9, was taking coumadin 5mg  daily with 10mg  once a week. - Recently increased greens in her diet - Compliant on atenolol, diltiazem for rate control. Follows with cardiology    ROS: Per HPI  PMH: Smoking history reviewed.   Objective:  Physical Exam: BP 130/70   Pulse 67   Temp 98.5 F (36.9 C)   Wt 151 lb 12.8 oz (68.9 kg)   BMI 26.06 kg/m   Gen: NAD, resting comfortably CV: RRR with no murmurs appreciated Pulm: NWOB, CTAB with no crackles, wheezes, or rhonchi GI: Normal bowel sounds present. Soft, Nontender, Nondistended. GU: external vaginal area normal. Vaginal introitus has area of prominence on anterior side of vaginal wall that is fleshy with area of dark red lesion approximately 1cm in diameter, no tenderness to palpation, no fluctuance. Vaginal wall otherwise unremarkable. Cervix without lesions. MSK: no edema, cyanosis, or clubbing noted Skin: warm, dry. 2cm skin tag in L inguinal fold. Neuro: grossly normal, moves all extremities Psych: Normal affect and thought content  Results for orders placed or performed in visit on 12/13/16 (from the past 72 hour(s))  POCT INR     Status: None   Collection Time:  12/13/16 12:00 PM  Result Value Ref Range   INR 1.9      Assessment/Plan:  Long term current use of anticoagulant therapy INR slightly subtherapeutic today at 1.9, goal between 2-3. Likely d/t increased intake of greens. Reviewed importance of consistent intake today. - Coumadin dosing: 5mg  qd with 7.5mg  twice a week on Mondays and Fridays. - Monthly lab visits for INR checks - Continue rate control per cardiology.   Diabetes mellitus type 2, controlled, without complications ([place]) Controlled and doing well on current regimen. - Continue metformin 500mg  BID and glimepiride 2mg  qd - Patient will schedule annual DM eye exam with [facility]  Hypertension associated with diabetes Controlled on current regimen. Followed by cardiology.  - Continue atenolol, diltiazem, lisinopril  Vaginal lesion Uncertain etiology. Given anterior location in the introitus, unlikely polyp or cyst. - Refer to gynecology for further evaluation.   Health care maintenance - Referral for colonoscopy placed today - Refused mammogram - Pap collected today  Follow up in 6 months for diabetes follow up with monthly lab visits fir INR checks.  Bufford Lope, DO PGY-1, [facility] 12/13/2016 10:46 AM

## 2016-12-13 NOTE — Assessment & Plan Note (Signed)
Uncertain etiology. Given anterior location in the introitus, unlikely polyp or cyst. - Refer to gynecology for further evaluation.

## 2016-12-13 NOTE — Assessment & Plan Note (Signed)
Controlled and doing well on current regimen. - Continue metformin 500mg  BID and glimepiride 2mg  qd - Patient will schedule annual DM eye exam with [facility]

## 2016-12-13 NOTE — Patient Instructions (Addendum)
It was great to see you again!  For your coumadin, - Eat a consistent amount of greens day to today. - We changed your coumadin to 5mg  daily and 7.5mg  Monday and Friday - Please come in monthly for INR checks   For the vaginal lesion, - You should get a call within the next 2 weeks to see a gynecologist for further evaluation  For your health maintenance, - Please call and make an appointment for your diabetic eye exam, please have your eye doctor send [place] the results. - You should get a call within the next 2 weeks to schedule your colonoscopy. - Continue your efforts in healthy diet and exercise.  - Please let us know if you change your mind about the mammogram.   We are checking some labs today, and someone will call you or send you a letter with the results when they are available.   Take care and seek immediate care sooner if you develop any concerns.   Dr. Bufford Lope, [place]

## 2016-12-15 ENCOUNTER — Encounter: Payer: Self-pay | Admitting: Family Medicine

## 2016-12-15 LAB — CYTOLOGY - PAP: Diagnosis: NEGATIVE

## 2017-01-05 ENCOUNTER — Telehealth: Payer: Self-pay | Admitting: Family Medicine

## 2017-01-05 NOTE — Telephone Encounter (Signed)
Spoke with patient regarding Gyn referral. Patient states has gotten message but has not yet called back to schedule appointment. Has contact information for [facility].

## 2017-01-10 ENCOUNTER — Ambulatory Visit (INDEPENDENT_AMBULATORY_CARE_PROVIDER_SITE_OTHER): Payer: BLUE CROSS/BLUE SHIELD | Admitting: *Deleted

## 2017-01-10 DIAGNOSIS — I482 Chronic atrial fibrillation, unspecified: Secondary | ICD-10-CM

## 2017-01-10 DIAGNOSIS — Z7901 Long term (current) use of anticoagulants: Secondary | ICD-10-CM

## 2017-01-10 LAB — POCT INR: INR: 2.4

## 2017-01-19 ENCOUNTER — Other Ambulatory Visit: Payer: Self-pay | Admitting: Family Medicine

## 2017-01-19 DIAGNOSIS — Z7901 Long term (current) use of anticoagulants: Secondary | ICD-10-CM

## 2017-01-19 MED ORDER — WARFARIN SODIUM 5 MG PO TABS: ORAL_TABLET | ORAL | 1 refills | Status: DC

## 2017-01-19 NOTE — Telephone Encounter (Signed)
Needs refill on warfarin.was told she needed physicans approval for a refill.  [facility] on [address]

## 2017-02-07 ENCOUNTER — Ambulatory Visit (INDEPENDENT_AMBULATORY_CARE_PROVIDER_SITE_OTHER): Payer: BLUE CROSS/BLUE SHIELD | Admitting: *Deleted

## 2017-02-07 DIAGNOSIS — I482 Chronic atrial fibrillation, unspecified: Secondary | ICD-10-CM

## 2017-02-07 DIAGNOSIS — Z7901 Long term (current) use of anticoagulants: Secondary | ICD-10-CM | POA: Diagnosis not present

## 2017-02-07 LAB — POCT INR: INR: 2.4

## 2017-02-16 ENCOUNTER — Encounter: Payer: Self-pay | Admitting: Family Medicine

## 2017-03-07 ENCOUNTER — Ambulatory Visit (INDEPENDENT_AMBULATORY_CARE_PROVIDER_SITE_OTHER): Payer: BLUE CROSS/BLUE SHIELD | Admitting: *Deleted

## 2017-03-07 DIAGNOSIS — Z7901 Long term (current) use of anticoagulants: Secondary | ICD-10-CM

## 2017-03-07 DIAGNOSIS — I482 Chronic atrial fibrillation, unspecified: Secondary | ICD-10-CM

## 2017-03-07 LAB — POCT INR: INR: 2

## 2017-04-03 ENCOUNTER — Other Ambulatory Visit: Payer: Self-pay | Admitting: Family Medicine

## 2017-04-03 DIAGNOSIS — Z7901 Long term (current) use of anticoagulants: Secondary | ICD-10-CM

## 2017-04-04 ENCOUNTER — Telehealth: Payer: Self-pay | Admitting: *Deleted

## 2017-04-04 ENCOUNTER — Ambulatory Visit (INDEPENDENT_AMBULATORY_CARE_PROVIDER_SITE_OTHER): Payer: BLUE CROSS/BLUE SHIELD | Admitting: *Deleted

## 2017-04-04 DIAGNOSIS — I482 Chronic atrial fibrillation, unspecified: Secondary | ICD-10-CM

## 2017-04-04 DIAGNOSIS — Z7901 Long term (current) use of anticoagulants: Secondary | ICD-10-CM | POA: Diagnosis not present

## 2017-04-04 LAB — POCT INR: INR: 2.6

## 2017-04-04 MED ORDER — WARFARIN SODIUM 5 MG PO TABS: ORAL_TABLET | ORAL | 2 refills | Status: DC

## 2017-04-04 MED ORDER — WARFARIN SODIUM 2.5 MG PO TABS
ORAL_TABLET | ORAL | 2 refills | Status: DC
Start: 2017-04-04 — End: 2017-07-13

## 2017-04-04 NOTE — Telephone Encounter (Signed)
Rx sent for 5mg  and 2.5mg  tablets.

## 2017-04-18 ENCOUNTER — Ambulatory Visit (INDEPENDENT_AMBULATORY_CARE_PROVIDER_SITE_OTHER): Payer: BLUE CROSS/BLUE SHIELD | Admitting: Internal Medicine

## 2017-04-18 ENCOUNTER — Encounter: Payer: Self-pay | Admitting: Internal Medicine

## 2017-04-18 VITALS — BP 125/65 | HR 68 | Temp 98.4°F | Wt 150.0 lb

## 2017-04-18 DIAGNOSIS — L03116 Cellulitis of left lower limb: Secondary | ICD-10-CM

## 2017-04-18 MED ORDER — DOXYCYCLINE HYCLATE 100 MG PO TABS: 100.0000 mg | ORAL_TABLET | Freq: Two times a day (BID) | ORAL | 0 refills | Status: DC

## 2017-04-18 NOTE — Progress Notes (Signed)
   [facility] Kerrin Mo, MD Phone: [phone]  Reason For Visit: SDA for leg pain   # Patient states anterior thigh pain since Saturday night. Does not remmeber an trauma, states she was reaching from some milk at the store and the pain possibly started afterward. Patient states that it is constant pain. Heat and tiger balm helps. Patient states that the anterior thigh was slightly swollen yesterday however feels like it has improved since the.  Walking on her leg makes it worse, rest improves the pain. Patient states that pain stays in that one spot. Does not radiate anywhere. Patient notes having erythema after using a heating pad today for 8 hours, took it off at 1 pm today. Did not previously have erthyema, until after using the heating pad. 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  No fevers or chills. No other systemic symptoms of feeling ill.   Past Medical History Reviewed problem list.  Medications- reviewed and updated No additions to family history Social history- patient is a non smoker  Objective: BP 125/65   Pulse 68   Temp 98.4 F (36.9 C)   Wt 150 lb (68 kg)   SpO2 96%   BMI 25.75 kg/m  Gen: NAD, alert, cooperative with exam Cardio: regular rate and rhythm, S1S2 heard, no murmurs appreciated Pulm: clear to auscultation bilaterally, no wheezes, rhonchi or rales Skin: Left thigh with medial leg swelling and erythema, tenderness to palpation. ROM wnl, thought some pain with movement, 5/5 strength in bilatearl legs Circumference of left and right thigh 42 cm/ 41 cm respectively  Neuro: Strength and sensation grossly intact   Assessment/Plan: See problem based a/p  Cellulitis Left medial leg with erythema sudden onset over the past 8 hours, with 3 days of pain. No concern for clots given patient without any lower extremity swelling, popliteal fossa pain. Measure difference between right and left thigh approximately  0.5-1 cm. Given patient with normal range of motion unlikely tendon tear. Will treat patient as a cellulitis.  - Doxycycline 10 days -Follow up in 2 days -Discussed return precautions including systemic symptoms and worsening of erythema

## 2017-04-18 NOTE — Patient Instructions (Signed)
I want you to take antibiotics for the next 10 days. I want you to follow up on Friday to have your leg rechecked. For the pain you can take tylenol as needed.

## 2017-04-19 NOTE — Assessment & Plan Note (Addendum)
Left medial leg with erythema sudden onset over the past 8 hours, with 3 days of pain. No concern for clots given patient without any lower extremity swelling, popliteal fossa pain. Measure difference between right and left thigh approximately 0.5-1 cm. Given patient with normal range of motion unlikely tendon tear. Will treat patient as a cellulitis.  - Doxycycline 10 days -Follow up in 2 days -Discussed return precautions including systemic symptoms and worsening of erythema -patient also evaluated by Dr. Mingo Amber

## 2017-04-20 ENCOUNTER — Ambulatory Visit: Payer: BLUE CROSS/BLUE SHIELD | Admitting: Family Medicine

## 2017-04-20 ENCOUNTER — Ambulatory Visit (INDEPENDENT_AMBULATORY_CARE_PROVIDER_SITE_OTHER): Payer: BLUE CROSS/BLUE SHIELD | Admitting: Family Medicine

## 2017-04-20 ENCOUNTER — Encounter: Payer: Self-pay | Admitting: Family Medicine

## 2017-04-20 VITALS — BP 120/62 | HR 66 | Temp 98.2°F | Ht 64.0 in | Wt 148.0 lb

## 2017-04-20 DIAGNOSIS — L03116 Cellulitis of left lower limb: Secondary | ICD-10-CM | POA: Diagnosis not present

## 2017-04-20 DIAGNOSIS — Z23 Encounter for immunization: Secondary | ICD-10-CM

## 2017-04-20 DIAGNOSIS — I482 Chronic atrial fibrillation, unspecified: Secondary | ICD-10-CM

## 2017-04-20 DIAGNOSIS — E119 Type 2 diabetes mellitus without complications: Secondary | ICD-10-CM | POA: Diagnosis not present

## 2017-04-20 DIAGNOSIS — Z7901 Long term (current) use of anticoagulants: Secondary | ICD-10-CM

## 2017-04-20 LAB — POCT INR: INR: 1.6

## 2017-04-20 LAB — POCT GLYCOSYLATED HEMOGLOBIN (HGB A1C): Hemoglobin A1C: 6.6

## 2017-04-20 NOTE — Progress Notes (Signed)
    Subjective:  Krystal Little is a 61 y.o. female who presents to the [facility] today with a chief complaint of leg cellulitis and INR 1.6.  Leg cellulitis is resolving both in terms of appearance and discomfort.   It was slow onset with no known trigger and patient has been consistently afebrile.   Diagnosed 2 days ago and prescribed 10 days of doxy.     Still some discomfort on extension of quadracep but able to ambulate and wants to go back to work   Objective:  Physical Exam: BP 120/62   Pulse 66   Temp 98.2 F (36.8 C) (Oral)   Ht 5\' 4"  (1.626 m)   Wt 148 lb (67.1 kg)   SpO2 98%   BMI 25.40 kg/m   Gen: NAD, resting comfortably CV: Irregular rate (not tachy) with 2/6 murmur appreciated that patient said is preexisting Pulm: NWOB, CTAB with no crackles, wheezes, or rhonchi GI: Normal bowel sounds present. Soft, Nontender, Nondistended. MSK: no edema, cyanosis, or clubbing noted Skin: warm, dry, cellulitis on L leg not discernable visibly and only minimally to palpation Neuro: grossly normal, moves all extremities Psych: Normal affect and thought content  Results for orders placed or performed in visit on 04/20/17 (from the past 72 hour(s))  POCT glycosylated hemoglobin (Hb A1C)     Status: None   Collection Time: 04/20/17  1:42 PM  Result Value Ref Range   Hemoglobin A1C 6.6      Assessment/Plan:  Long term current use of anticoagulant therapy INR 1.6.   Patient admits not taking all doses as prescribed.   We discussed need to do so and life threatening risks of noncompliance.   Patient will continue prescribed dose and followup with INR check in 1 wk.  Cellulitis Cellulitis no longer visually identifiable.  Patient feels there is assymetric swelling but it is difficult to corroborate on exam.   Some sensation of "tightness" with extension of the quadracep both passive and actively.   Plan is to continue doxy (currently day 3 of 10) and followup again if symptoms do not  continue resolving or if they get worse or if patient develops a fever.  Diabetes mellitus type 2, controlled, without complications ([place]) H2R drawn, will discuss pending result at later visits   Sherene Sires, [place] - PGY1 04/20/2017 2:26 PM

## 2017-04-20 NOTE — Assessment & Plan Note (Signed)
Cellulitis no longer visually identifiable.  Patient feels there is assymetric swelling but it is difficult to corroborate on exam.   Some sensation of "tightness" with extension of the quadracep both passive and actively.   Plan is to continue doxy (currently day 3 of 10) and followup again if symptoms do not continue resolving or if they get worse or if patient develops a fever.

## 2017-04-20 NOTE — Assessment & Plan Note (Signed)
INR 1.6.   Patient admits not taking all doses as prescribed.   We discussed need to do so and life threatening risks of noncompliance.   Patient will continue prescribed dose and followup with INR check in 1 wk.

## 2017-04-20 NOTE — Progress Notes (Deleted)
   Subjective:   Patient ID: Krystal Little    DOB: 10/31/1955, 61 y.o. female   MRN: [id number]  CC: "Leg pain"  HPI: Krystal Little is a 61 y.o. female who presents to clinic today for leg pain follow-up. Problems discussed today are as follows:  Left lower extremity pain: *** ROS: ***  ***Last seen 10/3 for cellulitis of left lower leg. Has history of sudden onset. Did not show any swelling with concern for clot. Treated with doxycycline for 10 days.  Complete ROS performed, see HPI for pertinent.  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 Surgical history cardiac cath R&L. Family history HTN, DM, lung cancer (brother). Smoking status reviewed. Medications reviewed.  Objective:   There were no vitals taken for this visit. Vitals and nursing note reviewed.  General: well nourished, well developed, in no acute distress with non-toxic appearance HEENT: normocephalic, atraumatic, moist mucous membranes Neck: supple, non-tender without lymphadenopathy CV: regular rate and rhythm without murmurs, rubs, or gallops, no lower extremity edema Lungs: clear to auscultation bilaterally with normal work of breathing Abdomen: soft, non-tender, non-distended, no masses or organomegaly palpable, normoactive bowel sounds Skin: warm, dry, no rashes or lesions, cap refill < 2 seconds Extremities: warm and well perfused, normal tone  Assessment & Plan:   No problem-specific Assessment & Plan notes found for this encounter.  No orders of the defined types were placed in this encounter.  No orders of the defined types were placed in this encounter.   Harriet Butte, [place], PGY-2 04/20/2017 8:20 AM

## 2017-04-20 NOTE — Assessment & Plan Note (Signed)
A1C drawn, will discuss pending result at later visits

## 2017-04-20 NOTE — Patient Instructions (Signed)
It was a pleasure to see you today! Thank you for choosing [facility] for your primary care. Krystal Little was seen for cellulitis in leg and INR check. Come back to the clinic if you get a fever or if pain symptoms get worse in your leg, and go to the emergency room if you develop and life threatening symptoms.   You should continue the 10 days of doxycycline as prescribed for your leg.   Please also continue your scheduled coumadin.   Please consider writing out your daily schedule and having it next to all your medications for a reference.  We would like to see you in 1 wk for an INR check with Herbie Baltimore in the lab  If we did any lab work today, and the results require attention, either me or my nurse will get in touch with you. If everything is normal, you will get a letter in mail and a message via . If you don't hear from [place] in two weeks, please give [place] a call. Otherwise, we look forward to seeing you again at your next visit. If you have any questions or concerns before then, please call the clinic at [phone].  Please bring all your medications to every doctors visit  Sign up for My Chart to have easy access to your labs results, and communication with your Primary care physician.    Please check-out at the front desk before leaving the clinic.    Best,  Dr. Sherene Sires FAMILY MEDICINE RESIDENT - PGY1 04/20/2017 2:14 PM

## 2017-04-27 ENCOUNTER — Ambulatory Visit (INDEPENDENT_AMBULATORY_CARE_PROVIDER_SITE_OTHER): Payer: BLUE CROSS/BLUE SHIELD | Admitting: *Deleted

## 2017-04-27 DIAGNOSIS — Z7901 Long term (current) use of anticoagulants: Secondary | ICD-10-CM | POA: Diagnosis not present

## 2017-04-27 LAB — POCT INR: INR: 2.2

## 2017-05-02 ENCOUNTER — Ambulatory Visit: Payer: BLUE CROSS/BLUE SHIELD

## 2017-05-04 ENCOUNTER — Ambulatory Visit: Payer: BLUE CROSS/BLUE SHIELD

## 2017-05-09 ENCOUNTER — Encounter: Payer: Self-pay | Admitting: Gynecology

## 2017-05-09 ENCOUNTER — Ambulatory Visit (INDEPENDENT_AMBULATORY_CARE_PROVIDER_SITE_OTHER): Payer: BLUE CROSS/BLUE SHIELD | Admitting: Gynecology

## 2017-05-09 VITALS — BP 140/78 | Ht 63.0 in | Wt 148.0 lb

## 2017-05-09 DIAGNOSIS — N368 Other specified disorders of urethra: Secondary | ICD-10-CM

## 2017-05-09 DIAGNOSIS — N952 Postmenopausal atrophic vaginitis: Secondary | ICD-10-CM

## 2017-05-09 DIAGNOSIS — N939 Abnormal uterine and vaginal bleeding, unspecified: Secondary | ICD-10-CM | POA: Diagnosis not present

## 2017-05-09 NOTE — Patient Instructions (Addendum)
Follow up for ultrasound as scheduled  Schedule your colonoscopy with either:  [facility]   Address: [address], [place], [place] [address]  [phone]    or  [facility]  Address: [address], [place], [place] [address]  [phone]      Call to Schedule your mammogram  Facilities in [place]: 1)  [place] [place] Phone: [phone])  Dr. Isaiah Blakes at [facility] [address] Phone: [phone]     Mammogram A mammogram is an X-ray test to find changes in a woman's breast. You should get a mammogram if:  You are 62 years of age or older  You have risk factors.   Your doctor recommends that you have one.  BEFORE THE TEST  Do not schedule the test the week before your period, especially if your breasts are sore during this time.  On the day of your mammogram:  Wash your breasts and armpits well. After washing, do not put on any deodorant or talcum powder on until after your test.   Eat and drink as you usually do.   Take your medicines as usual.   If you are diabetic and take insulin, make sure you:   Eat before coming for your test.   Take your insulin as usual.   If you cannot keep your appointment, call before the appointment to cancel. Schedule another appointment.  TEST  You will need to undress from the waist up. You will put on a hospital gown.   Your breast will be put on the mammogram machine, and it will press firmly on your breast with a piece of plastic called a compression paddle. This will make your breast flatter so that the machine can X-ray all parts of your breast.   Both breasts will be X-rayed. Each breast will be X-rayed from above and from the side. An X-ray might need to be taken again if the picture is not good enough.   The mammogram will last about 15 to 30 minutes.  AFTER THE TEST Finding out  the results of your test Ask when your test results will be ready. Make sure you get your test results.  Document Released: 09/29/2008 Document Revised: 06/22/2011 Document Reviewed: 09/29/2008 [facility] Patient Information 2012 [place].

## 2017-05-09 NOTE — Progress Notes (Signed)
    Krystal Little [birth year] [id number]        61 y.o.  G2P2001 presents in consultation from Dr. Shawna Orleans from [facility] where on routine exam was described "Vaginal introitus has area of prominence on anterior side of vaginal wall that is fleshy with area of dark red lesion approximately 1cm in diameter, no tenderness to palpation, no fluctuance. Vaginal wall otherwise unremarkable".  Pap smear was performed which was normal.  Breast exam was not performed.  Patient denies any history of gynecologic issues in the past although does note occasional spotting.  She is on Coumadin.  Having no pelvic pain, vaginal discharge or odor.  No urinary symptoms such as frequency dysuria urgency low back pain.  No GI issues to include diarrhea or constipation.  No history of hemorrhoids.   Past medical history,surgical history, problem list, medications, allergies, family history and social history were all reviewed and documented in the EPIC chart.  Directed ROS with pertinent positives and negatives documented in the history of present illness/assessment and plan.  Exam: Caryn Bee assistant Vitals:   05/09/17 1144  BP: 140/78  Weight: 148 lb (67.1 kg)  Height: 5\' 3"  (1.6 m)   General appearance:  Normal HEENT normal. Lungs clear bilaterally. Cardiac regular rate no rubs murmurs or gallops. Abdomen soft nontender without masses guarding rebound Breasts examined lying and sitting without masses, retractions, discharge or adenopathy. Pelvic external BUS vagina with atrophic changes.  Very fragile with spotting with Q-tip rubbing of the vaginal mucosa.  Mild urethral prolapse noted.  Cervix with obstetrical scarring but no gross lesions.  Bimanual shows uterus to be normal size midline mobile nontender.  Adnexa without masses or tenderness.  Rectal exam is normal.  Assessment/Plan:  61 y.o. G2P2001 with exam showing vaginal mucosa fragility and erythema.  Also with mild urethral  prolapse.  I think this is what was seen on her last exam.  She does give a history of spotting on Coumadin which I think probably is vaginal in origin but I reviewed other possibilities to include a uterine source.  I have recommended that we proceed with ultrasound for endometrial echo assessment.  Her Pap smear was negative.  Cervix does have scarring from obstetrical tearing.  I was unable to negotiate a cervical canal with the Endo brush and will see if it is possible for sonohysterogram if endometrial echo looks generous.  If thin then will stop there.  Patient will schedule in follow-up for this.   Her breast exam is normal today.  It is been several years since she has had a mammogram and I strongly recommend that she schedule a screening mammogram.  Most common cancer in women reviewed and the benefits of early detection stressed.  She is also 10 years out from her colonoscopy and the need to schedule a screening colonoscopy was also stressed with her.  Patient agrees to arrange both.  Names and numbers provided for both.  Patient will follow-up for sonohysterogram and we will go from there.  Consult letter was sent to Dr Shawna Orleans in follow-up of our 30-minute consultation    Anastasio Auerbach MD, 12:20 PM 05/09/2017

## 2017-05-22 ENCOUNTER — Other Ambulatory Visit: Payer: Self-pay | Admitting: Gynecology

## 2017-05-22 DIAGNOSIS — N950 Postmenopausal bleeding: Secondary | ICD-10-CM

## 2017-05-25 ENCOUNTER — Ambulatory Visit: Payer: BLUE CROSS/BLUE SHIELD

## 2017-05-31 ENCOUNTER — Ambulatory Visit: Payer: BLUE CROSS/BLUE SHIELD | Admitting: Cardiology

## 2017-06-01 ENCOUNTER — Ambulatory Visit: Payer: BLUE CROSS/BLUE SHIELD | Admitting: Cardiology

## 2017-06-01 ENCOUNTER — Encounter: Payer: Self-pay | Admitting: Cardiology

## 2017-06-01 VITALS — BP 150/96 | HR 67 | Ht 64.0 in | Wt 153.0 lb

## 2017-06-01 DIAGNOSIS — I10 Essential (primary) hypertension: Secondary | ICD-10-CM

## 2017-06-01 DIAGNOSIS — I482 Chronic atrial fibrillation, unspecified: Secondary | ICD-10-CM

## 2017-06-01 DIAGNOSIS — E1169 Type 2 diabetes mellitus with other specified complication: Secondary | ICD-10-CM | POA: Diagnosis not present

## 2017-06-01 DIAGNOSIS — Z9889 Other specified postprocedural states: Secondary | ICD-10-CM

## 2017-06-01 DIAGNOSIS — E1159 Type 2 diabetes mellitus with other circulatory complications: Secondary | ICD-10-CM

## 2017-06-01 DIAGNOSIS — I152 Hypertension secondary to endocrine disorders: Secondary | ICD-10-CM

## 2017-06-01 DIAGNOSIS — Z950 Presence of cardiac pacemaker: Secondary | ICD-10-CM | POA: Diagnosis not present

## 2017-06-01 DIAGNOSIS — E785 Hyperlipidemia, unspecified: Secondary | ICD-10-CM | POA: Diagnosis not present

## 2017-06-01 DIAGNOSIS — I080 Rheumatic disorders of both mitral and aortic valves: Secondary | ICD-10-CM

## 2017-06-01 MED ORDER — ATENOLOL 50 MG PO TABS: 50.0000 mg | ORAL_TABLET | Freq: Every day | ORAL | 3 refills | Status: DC

## 2017-06-01 MED ORDER — DILTIAZEM HCL ER COATED BEADS 180 MG PO CP24: 180.0000 mg | ORAL_CAPSULE | Freq: Every day | ORAL | 3 refills | Status: DC

## 2017-06-01 NOTE — Patient Instructions (Signed)
No change with medications    Schedule at [address] in [place] or nov 2019  Your physician has requested that you have an echocardiogram. Echocardiography is a painless test that uses sound waves to create images of your heart. It provides your doctor with information about the size and shape of your heart and how well your heart's chambers and valves are working. This procedure takes approximately one hour. There are no restrictions for this procedure.    Please have  Primary or who every checks INR - CHECK BLOOD PRESSURES   IF BLOOD PRESSURE CONTINUALLY above 140/90--- May NEED TO INCREASE LISNIOPRIL TO 10 MG.     Your physician wants you to follow-up in [place].You will receive a reminder letter in the mail two months in advance. If you don't receive a letter, please call our office to schedule the follow-up appointment.   If you need a refill on your cardiac medications before your next appointment, please call your pharmacy.

## 2017-06-01 NOTE — Progress Notes (Signed)
PCP: Bufford Lope, DO  Clinic Note: Chief Complaint  Patient presents with  . Follow-up    12 months  . Cardiac Valve Problem    Status post MVR.    HPI: Krystal Little is a 61 y.o. 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  She presents today for annual follow-up.  Long-standing rheumatic heart disease in childhood -> moderate-severe MR with prolapse --> develops severe acute sick sinus syndrome with syncope in December 2000  December 2000: Mitral valve repair with ring annuloplasty and transposition of chordee (Dr. Cyndia Bent)   Pacemaker placement by Dr. Rollene Fare  She is in chronic persistent A. fib and flutter -attempted flutter ablation.  Also failed antiarrhythmics including sotalol Tikosyn   CHA2DS2-VASc Score =3, on warfarin  Krystal Little was last seen in early November 2017.  She is doing fairly well.  Noted a bit of exertional dyspnea and fatigue.  Not as active as usual.  He previously had been able to walk 2 miles a day, but mostly because of fear for safety, she has gotten out of the habit.  --She was seen by Dr. Curt Bears (EP) 1 month later in December 2017 --> PPM functioning well.    Recent Hospitalizations: None  Studies Personally Reviewed - (if available, images/films reviewed: From Epic Chart or Care Everywhere)  Transthoracic echo 06/15/2016 mild LVH.  EF 55-60%.  Mild aortic regurgitation.:  Status post MV repair with thickening and some restricted movement.  Mean gradient 4 mmHg, peak gradient 13 mmHg. C/w mild MS. severe biatrial enlargement.  Minimally elevated pulmonary pressures.  Stable from 2016  Interval History: Krystal Little returns today doing fairly well.  She really has no major complaints.  She may note that if she is walking up a hill, or up a stadium stairs etc., she would get short of breath, but no chest tightness or pressure.  She does indicate  that she has allowed herself to get a little bit out of shape.  Her she is changed to a new job which is more sedentary.  Her previous job had her up and around and her new position is more sedentary. She may occasionally feel palpitations, but nothing prolonged.  No lightheadedness, dizziness or syncope/near syncope, TIA/amaurosis fugax.  No chest pain or shortness of breath with rest or exertion. No PND, orthopnea or edema. N No melena, hematochezia, hematuria, or epstaxis. No claudication.  She does take prophylactic antibiotics for SBE prophylaxis prior to procedures.  ROS: A comprehensive was performed. Review of Systems  Constitutional: Negative for malaise/fatigue (She just feels lazy because she is not doing exercise).  HENT: Negative for congestion.   Respiratory: Positive for shortness of breath (Per HPI). Negative for cough.   Gastrointestinal: Negative for heartburn.  Genitourinary: Positive for frequency.  Musculoskeletal: Negative for joint pain.  Neurological: Negative for dizziness.  Psychiatric/Behavioral: Negative for memory loss. The patient is not nervous/anxious and does not have insomnia.   All other systems reviewed and are negative.   I have reviewed and (if needed) personally updated the patient's problem list, medications, allergies, past medical and surgical history, social and family history.   Past Medical History:  Diagnosis Date  . Chronic atrial fibrillation ([place]) 1990s   s/p DCCV then attempted ablation of complex A Flutter (@ [facility] - Dr. Deno Etienne), failed antiarrhythmics --> INR folllwed @ [facility]  . Diabetes mellitus type 2, uncontrolled, without  complications ([place])    On Oral Medications ([facility])  . Essential hypertension   . History of cardiac catheterization 2001   R&LHC - normal Coronaries, no evidence of Restictive Cardiomyopathy or Constrictive Pericarditis (also @ [facility])  . Hx of sick sinus syndrome 07/1999   Wtih symptomatic  bradycardia - syncope ([place])  . 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 CARDIAC CATHETERIZATION  08/03/1999   [place] - normal Coronaries; no sign of constriction or restrictive cardiomypathy  . MITRAL VALVE REPAIR  07/1999   Both Ant& Post leaflet repair - quadrangular resection&caudal transposition, #32 Sequin Annuloplasty ring  . PACEMAKER GENERATOR CHANGE  11/26/2008   Medtronic Adapta L(? if it has been checked since 02/2013)  . PACEMAKER INSERTION  07/1999   for SSS in setting of Chronic Afib; Medtronic Kappa [id number], [id number] H.  . TRANSTHORACIC ECHOCARDIOGRAM  03/21/2013    Current Meds  Medication Sig  . atenolol (TENORMIN) 50 MG tablet Take 1 tablet (50 mg total) daily by mouth.  . diltiazem (CARDIZEM CD) 180 MG 24 hr capsule Take 1 capsule (180 mg total) daily by mouth.  Marland Kitchen glimepiride (AMARYL) 2 MG tablet TAKE 1 TABLET BY MOUTH EVERY DAY BEFORE BREAKFAST  . lisinopril (PRINIVIL,ZESTRIL) 5 MG tablet Take 1 tablet (5 mg total) by mouth daily.  . metFORMIN (GLUCOPHAGE-XR) 500 MG 24 hr tablet Take 1 tablet (500 mg total) by mouth 2 (two) times daily with a meal.  . warfarin (COUMADIN) 2.5 MG tablet To go with the 5mg  tablets to have a total dose of 7.5mg  on Mondays and Fridays.  Marland Kitchen warfarin (COUMADIN) 5 MG tablet Take 5 mg daily but 7.5mg  on Mondays and Fridays  . [DISCONTINUED] atenolol (TENORMIN) 50 MG tablet Take 1 tablet (50 mg total) by mouth daily.  . [DISCONTINUED] diltiazem (CARDIZEM CD) 180 MG 24 hr capsule Take 1 capsule (180 mg total) by mouth daily.    No  Known Allergies  Social History   Socioeconomic History  . Marital status: Married    Spouse name: Riki Rusk   . Number of children: 2  . Years of education: 16 years.  . Highest education level: None  Social Needs  . Financial resource strain: None  . Food insecurity - worry: None  . Food insecurity - inability: None  . Transportation needs - medical: None  . Transportation needs - non-medical: None  Occupational History  . Occupation: product development    Employer: [employer]    Comment: prev worked as Education officer, environmental   Tobacco Use  . Smoking status: Never Smoker  . Smokeless tobacco: Never Used  Substance and Sexual Activity  . Alcohol use: Yes    Alcohol/week: 0.0 oz    Comment: occasional, once every month or two- glass of wine   . Drug use: No  . Sexual activity: Not Currently    Partners: Male    Comment: 1st intercourse 77 yo-5 partners  Other Topics Concern  . None  Social History Narrative   Lives with husband, great-niece (69)  and great-nephew (  74).    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   1 son died in an automobile accident @ age 66 (2010).    She raised several nieces & nephews - now lives with her 60 y/o daughter (who recently moved back in)   Routinely walks 2-3 days/week.   Started new job through the [employer].    family history includes Cancer in her brother; Diabetes in her sister; Hypertension in her mother; Lung cancer in her brother.  Wt Readings from Last 3 Encounters:  06/01/17 153 lb (69.4 kg)  05/09/17 148 lb (67.1 kg)  04/20/17 148 lb (67.1 kg)    PHYSICAL EXAM BP (!) 150/96   Pulse 67   Ht 5\' 4"  (1.626 m)   Wt 153 lb (69.4 kg)   BMI 26.26 kg/m  Physical Exam  Constitutional: She is oriented to person, place, and time. She appears well-developed and well-nourished. No distress.  Healthy-appearing.  Well-groomed  HENT:  Head: Atraumatic.  Neck: Hepatojugular  reflux and JVD (Visible pulsations to the angle of the jaw with minimal HJR.  Likely consistent with cannon A waves) present. Carotid bruit is not present.  Cardiovascular: Regular rhythm and normal pulses.  Occasional extrasystoles are present. PMI is not displaced. Exam reveals no gallop.  Murmur heard.  Medium-pitched harsh early systolic murmur is present at the upper right sternal border and lower left sternal border. Cannot exclude soft diastolic murmur. Occasionally split S2  Pulmonary/Chest: Effort normal. No respiratory distress. She has no wheezes. She has no rales.  Abdominal: Soft. Bowel sounds are normal. She exhibits no distension. There is no tenderness. There is no rebound.  Musculoskeletal: Normal range of motion. She exhibits no edema or deformity.  Neurological: She is alert and oriented to person, place, and time.  Skin: Skin is warm and dry.  Psychiatric: Judgment and thought content normal.    Adult ECG Report  Rate: 67 ;  Rhythm: unclear Atrial rhythm - ? Afib, V Pacced; artifact  Narrative Interpretation: stable    Other studies Reviewed: Additional studies/ records that were reviewed today include:  Recent Labs:    Lab Results  Component Value Date   CHOL 176 05/18/2016   HDL 52 05/18/2016   LDLCALC 107 05/18/2016   LDLDIRECT 107 (H) 10/02/2012   TRIG 87 05/18/2016   CHOLHDL 3.4 05/18/2016   Lab Results  Component Value Date   CREATININE 0.73 05/18/2016   BUN 12 05/18/2016   NA 141 05/18/2016   K 4.4 05/18/2016   CL 106 05/18/2016   CO2 28 05/18/2016   Lab Results  Component Value Date   WBC 4.4 05/18/2016   HGB 13.6 05/18/2016   HCT 40.7 05/18/2016   MCV 91.5 05/18/2016   PLT 164 05/18/2016    ASSESSMENT / PLAN: Problem List Items Addressed This Visit    Chronic atrial fibrillation ([place]) (Chronic)    Hard to tell with her underlying rhythm is on EKG.  She is on a combination of atenolol and diltiazem for rate control and has backup  ventricular pacing. Anticoagulate with warfarin partially because of her valve disease.      Relevant Medications   diltiazem (CARDIZEM CD) 180 MG 24 hr capsule   atenolol (TENORMIN) 50 MG tablet   Other Relevant Orders   EKG 12-Lead   ECHOCARDIOGRAM COMPLETE   Hyperlipidemia associated with type 2 diabetes mellitus ([place])    Patient still declines statin. LDL is 107.  Goal  should probably be close to 100.  At this point I agree that treatment will be reasonable, could consider Zetia if she would agree.  She has been reluctant to use statins because of side effect profile she is hard about.      Relevant Medications   diltiazem (CARDIZEM CD) 180 MG 24 hr capsule   atenolol (TENORMIN) 50 MG tablet   Hypertension associated with diabetes ([place]) (Chronic)    Blood pressure has been previously well controlled.  Earlier this week he was 130/90 but is high today at 150/96 mmHg. I will have her monitor her blood pressures at home, and then when she returns for her INR checks have asked that they check her blood pressure for the next few visits.  May need to titrate up lisinopril to 10 mg.      Relevant Medications   diltiazem (CARDIZEM CD) 180 MG 24 hr capsule   atenolol (TENORMIN) 50 MG tablet   MITRAL REGURGITATION - status post MVR - Primary (Chronic)    Valve looks stable by echo last year.  No active heart failure symptoms.  She is using prophylactic antibiotics. Plan will be to hold off on echo this year and wait till next year prior to annual follow-up.      Relevant Medications   diltiazem (CARDIZEM CD) 180 MG 24 hr capsule   atenolol (TENORMIN) 50 MG tablet   Other Relevant Orders   EKG 12-Lead   ECHOCARDIOGRAM COMPLETE   S/P MVR (mitral valve repair) (Chronic)    With stable findings last year, we can wait another year to recheck echo.      Relevant Orders   ECHOCARDIOGRAM COMPLETE   S/P placement of cardiac pacemaker (Chronic)    Most recent generator change in  2013. Followed by Dr. Curt Bears         Current medicines are reviewed at length with the patient today. (+/- concerns) n/a The following changes have been made: n/a - Blood pressure slightly elevated.  I will have her check her blood pressures when she goes for her INR evaluations.  If he continues to be above 140/90 mmHg, I would increase lisinopril to 10 mg daily.  Patient Instructions  No change with medications    Schedule at [facility]  [address] in [place] or nov 2019  Your physician has requested that you have an echocardiogram. Echocardiography is a painless test that uses sound waves to create images of your heart. It provides your doctor with information about the size and shape of your heart and how well your heart's chambers and valves are working. This procedure takes approximately one hour. There are no restrictions for this procedure.    Please have  Primary or who every checks INR - CHECK BLOOD PRESSURES   IF BLOOD PRESSURE CONTINUALLY above 140/90--- May NEED TO INCREASE LISNIOPRIL TO 10 MG.     Your physician wants you to follow-up in [place].You will receive a reminder letter in the mail two months in advance. If you don't receive a letter, please call our office to schedule the follow-up appointment.   If you need a refill on your cardiac medications before your next appointment, please call your pharmacy.    Studies Ordered:   Orders Placed This Encounter  Procedures  . EKG 12-Lead  . ECHOCARDIOGRAM COMPLETE      Glenetta Hew, M.D., M.S. Interventional Cardiologist   Pager # [phone] Phone # [phone] [address]  [place], [place] [address]

## 2017-06-03 ENCOUNTER — Encounter: Payer: Self-pay | Admitting: Cardiology

## 2017-06-03 NOTE — Assessment & Plan Note (Signed)
Patient still declines statin. LDL is 107.  Goal should probably be close to 100.  At this point I agree that treatment will be reasonable, could consider Zetia if she would agree.  She has been reluctant to use statins because of side effect profile she is hard about.

## 2017-06-03 NOTE — Assessment & Plan Note (Signed)
Valve looks stable by echo last year.  No active heart failure symptoms.  She is using prophylactic antibiotics. Plan will be to hold off on echo this year and wait till next year prior to annual follow-up.

## 2017-06-03 NOTE — Assessment & Plan Note (Signed)
Hard to tell with her underlying rhythm is on EKG.  She is on a combination of atenolol and diltiazem for rate control and has backup ventricular pacing. Anticoagulate with warfarin partially because of her valve disease.

## 2017-06-03 NOTE — Assessment & Plan Note (Signed)
Most recent generator change in 2013. Followed by Dr. Curt Bears

## 2017-06-03 NOTE — Assessment & Plan Note (Signed)
Blood pressure has been previously well controlled.  Earlier this week he was 130/90 but is high today at 150/96 mmHg. I will have her monitor her blood pressures at home, and then when she returns for her INR checks have asked that they check her blood pressure for the next few visits.  May need to titrate up lisinopril to 10 mg.

## 2017-06-03 NOTE — Assessment & Plan Note (Signed)
With stable findings last year, we can wait another year to recheck echo.

## 2017-06-04 ENCOUNTER — Other Ambulatory Visit: Payer: Self-pay | Admitting: Gynecology

## 2017-06-04 ENCOUNTER — Ambulatory Visit (INDEPENDENT_AMBULATORY_CARE_PROVIDER_SITE_OTHER): Payer: BLUE CROSS/BLUE SHIELD

## 2017-06-04 ENCOUNTER — Encounter: Payer: Self-pay | Admitting: Gynecology

## 2017-06-04 ENCOUNTER — Ambulatory Visit: Payer: BLUE CROSS/BLUE SHIELD | Admitting: Gynecology

## 2017-06-04 VITALS — BP 120/74

## 2017-06-04 DIAGNOSIS — N950 Postmenopausal bleeding: Secondary | ICD-10-CM

## 2017-06-04 DIAGNOSIS — N9489 Other specified conditions associated with female genital organs and menstrual cycle: Secondary | ICD-10-CM

## 2017-06-04 DIAGNOSIS — D252 Subserosal leiomyoma of uterus: Secondary | ICD-10-CM

## 2017-06-04 DIAGNOSIS — D251 Intramural leiomyoma of uterus: Secondary | ICD-10-CM | POA: Diagnosis not present

## 2017-06-04 DIAGNOSIS — D259 Leiomyoma of uterus, unspecified: Secondary | ICD-10-CM | POA: Diagnosis not present

## 2017-06-04 DIAGNOSIS — N858 Other specified noninflammatory disorders of uterus: Secondary | ICD-10-CM

## 2017-06-04 DIAGNOSIS — N939 Abnormal uterine and vaginal bleeding, unspecified: Secondary | ICD-10-CM | POA: Diagnosis not present

## 2017-06-04 DIAGNOSIS — N949 Unspecified condition associated with female genital organs and menstrual cycle: Secondary | ICD-10-CM | POA: Diagnosis not present

## 2017-06-04 NOTE — Progress Notes (Signed)
    Krystal Little October 01, 1955 [id number]        61 y.o.  G2P2001 presents for sonohysterogram due to episode of vaginal spotting.  Exam showed a very friable vaginal mucosa with cervix showing old obstetrical injury unable to negotiate cervical canal with Endo brush.  Currently on Coumadin.  Past medical history,surgical history, problem list, medications, allergies, family history and social history were all reviewed and documented in the EPIC chart.  Directed ROS with pertinent positives and negatives documented in the history of present illness/assessment and plan.  Exam: [name] assistant BP 120/74 General appearance:  Normal  Ultrasound transvaginal and transabdominal shows uterus with multiple measuring 21 mm 20 mm and probable left adnexal exophytic myoma measuring 63 mm.  Endometrial echo 4.0 mm excluding fluid within the endometrial cavity measuring 25 x 4 mm.  No endometrial defects seen within the fluid with good outlining of the cavity.  Left ovary not visualized right ovary normal.  Cul-de-sac negative.  Assessment/Plan:  61 y.o. G2P2001 with history of spotting on Coumadin with atrophic vaginal mucosa friable to touch.  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  Endometrial echo with small amount of endometrial fluid outlining the cavity nicely.  Endometrial echo measurement 4 mm excluding fluid.  Review of CT scan from 2010 commented on probable pedunculated myoma to the left of the fundus.  I discussed the issues of postmenopausal bleeding and with a thin endometrium at 4 mm particularly with fluid outlining a negative cavity unlikely significant pathology although cannot guarantee.  With her cervical stenosis on exam the question as to how hard it would be to proceed with endometrial biopsy and how far to carry that up to and including hysteroscopy D&C.  At this point given the obvious source from the vagina, the thin endometrial echo and being  on Coumadin will plan on monitoring for now.  She will report any further bleeding.  I did recommend follow-up ultrasound in 6 months to relook at this left adnexal myoma for stability as we could not see the ovaries separate from this and she will call if any further bleeding in between.  Greater than 50% of my time was spent in direct face to face counseling and coordination of care with the patient.     Anastasio Auerbach MD, 9:18 AM 06/04/2017

## 2017-06-04 NOTE — Patient Instructions (Signed)
FOLLOW UP FOR ULTRASOUND IN 6 MONTHS

## 2017-06-13 ENCOUNTER — Other Ambulatory Visit (HOSPITAL_COMMUNITY): Payer: BLUE CROSS/BLUE SHIELD

## 2017-06-30 ENCOUNTER — Other Ambulatory Visit: Payer: Self-pay | Admitting: Cardiology

## 2017-07-13 ENCOUNTER — Other Ambulatory Visit: Payer: Self-pay

## 2017-07-13 ENCOUNTER — Encounter: Payer: Self-pay | Admitting: Family Medicine

## 2017-07-13 ENCOUNTER — Other Ambulatory Visit: Payer: Self-pay | Admitting: *Deleted

## 2017-07-13 ENCOUNTER — Ambulatory Visit: Payer: BLUE CROSS/BLUE SHIELD | Admitting: Family Medicine

## 2017-07-13 VITALS — BP 132/76 | HR 76 | Temp 98.1°F | Ht 64.0 in | Wt 152.0 lb

## 2017-07-13 DIAGNOSIS — L03213 Periorbital cellulitis: Secondary | ICD-10-CM

## 2017-07-13 DIAGNOSIS — I482 Chronic atrial fibrillation, unspecified: Secondary | ICD-10-CM

## 2017-07-13 DIAGNOSIS — H00015 Hordeolum externum left lower eyelid: Secondary | ICD-10-CM | POA: Diagnosis not present

## 2017-07-13 DIAGNOSIS — Z7901 Long term (current) use of anticoagulants: Secondary | ICD-10-CM

## 2017-07-13 LAB — POCT INR: INR: 2.3

## 2017-07-13 MED ORDER — WARFARIN SODIUM 5 MG PO TABS: ORAL_TABLET | ORAL | 2 refills | Status: DC

## 2017-07-13 MED ORDER — ERYTHROMYCIN 5 MG/GM OP OINT: 1.0000 "application " | TOPICAL_OINTMENT | Freq: Every day | OPHTHALMIC | 0 refills | Status: DC

## 2017-07-13 MED ORDER — AMOXICILLIN 875 MG PO TABS: 875.0000 mg | ORAL_TABLET | Freq: Two times a day (BID) | ORAL | 0 refills | Status: AC

## 2017-07-13 MED ORDER — CLINDAMYCIN HCL 300 MG PO CAPS: 300.0000 mg | ORAL_CAPSULE | Freq: Three times a day (TID) | ORAL | 0 refills | Status: AC

## 2017-07-13 NOTE — Patient Instructions (Signed)
Stye A stye is a bump on your eyelid caused by a bacterial infection. A stye can form inside the eyelid (internal stye) or outside the eyelid (external stye). An internal stye may be caused by an infected oil-producing gland inside your eyelid. An external stye may be caused by an infection at the base of your eyelash (hair follicle). Styes are very common. Anyone can get them at any age. They usually occur in just one eye, but you may have more than one in either eye. What are the causes? The infection is almost always caused by bacteria called Staphylococcus aureus. This is a common type of bacteria that lives on your skin. What increases the risk? You may be at higher risk for a stye if you have had one before. You may also be at higher risk if you have:  Diabetes.  Long-term illness.  Long-term eye redness.  A skin condition called seborrhea.  High fat levels in your blood (lipids).  What are the signs or symptoms? Eyelid pain is the most common symptom of a stye. Internal styes are more painful than external styes. Other signs and symptoms may include:  Painful swelling of your eyelid.  A scratchy feeling in your eye.  Tearing and redness of your eye.  Pus draining from the stye.  How is this diagnosed? Your health care provider may be able to diagnose a stye just by examining your eye. The health care provider may also check to make sure:  You do not have a fever or other signs of a more serious infection.  The infection has not spread to other parts of your eye or areas around your eye.  How is this treated? Most styes will clear up in a few days without treatment. In some cases, you may need to use antibiotic drops or ointment to prevent infection. Your health care provider may have to drain the stye surgically if your stye is:  Large.  Causing a lot of pain.  Interfering with your vision.  This can be done using a thin blade or a needle. Follow these  instructions at home:  Take medicines only as directed by your health care provider.  Apply a clean, warm compress to your eye for 10 minutes, 4 times a day.  Do not wear contact lenses or eye makeup until your stye has healed.  Do not try to pop or drain the stye. Contact a health care provider if:  You have chills or a fever.  Your stye does not go away after several days.  Your stye affects your vision.  Your eyeball becomes swollen, red, or painful. This information is not intended to replace advice given to you by your health care provider. Make sure you discuss any questions you have with your health care provider. Document Released: 04/12/2005 Document Revised: 02/27/2016 Document Reviewed: 10/17/2013 Elsevier Interactive Patient Education  2018 [place] Cellulitis, Adult Preseptal cellulitis-also called periorbital cellulitis-is an infection that can affect your eyelid and the soft tissues or skin that surround your eye. The infection may also affect the structures that produce and drain your tears. It does not affect your eye itself. What are the causes? This condition may be caused by:  Bacterial infection.  Long-term (chronic) sinus infections.  An object (foreign body) that is stuck behind the eye.  An injury that: ? Goes through the eyelid tissues. ? Causes an infection, such as an insect sting.  Fracture of the bone around the  eye.  Infections that have spread from the eyelid or other structures around the eye.  Bite wounds.  Inflammation or infection of the lining membranes of the brain (meningitis).  An infection in the blood (septicemia).  Dental infection (abscess).  Viral infection. This is rare.  What increases the risk? Risk factors for preseptal cellulitis include:  Participating in activities that increase your risk of trauma to the face or head, such as boxing or high-speed activities.  Having a weakened defense system  (immune system).  Medical conditions, such as nasal polyps, that increase your risk for frequent or recurrent sinus infections.  Not receiving regular dental care.  What are the signs or symptoms? Symptoms of this condition usually come on suddenly. Symptoms may include:  Red, hot, and swollen eyelids.  Fever.  Difficulty opening your eye.  Eye pain.  How is this diagnosed? This condition may be diagnosed by an eye exam. You may also have tests, such as:  Blood tests.  CT scan.  MRI.  Spinal tap (lumbar puncture). This is a procedure that involves removing and examining a small amount of the fluid that surrounds the brain and spinal cord. This checks for meningitis.  How is this treated? Treatment for this condition will include antibiotic medicines. These may be given by mouth (orally), through an IV, or as a shot. Your health care provider may also recommend nasal decongestants to reduce swelling. Follow these instructions at home:  Take your antibiotic medicine as directed by your health care provider. Finish all of it even if you start to feel better.  Take medicines only as directed by your health care provider.  Drink enough fluid to keep your urine clear or pale yellow.  Do not use any tobacco products, including cigarettes, chewing tobacco, or electronic cigarettes. If you need help quitting, ask your health care provider.  Keep all follow-up visits as directed by your health care provider. These include any visits with an eye specialist (ophthalmologist) or dentist. Contact a health care provider if:  You have a fever.  Your eyelids become more red, warm, or swollen.  You have new symptoms.  Your symptoms do not get better with treatment. Get help right away if:  You develop double vision, or your vision becomes blurred or worsens in any way.  You have trouble moving your eyes.  Your eye looks like it is sticking out or bulging out (proptosis).  You  develop a severe headache, severe neck pain, or neck stiffness.  You develop repeated vomiting. This information is not intended to replace advice given to you by your health care provider. Make sure you discuss any questions you have with your health care provider. Document Released: 08/05/2010 Document Revised: 12/09/2015 Document Reviewed: 06/29/2014 Elsevier Interactive Patient Education  [employer].

## 2017-07-13 NOTE — Progress Notes (Signed)
Subjective:    Patient ID: Krystal Little, female    DOB: Jul 13, 1956, 61 y.o.   MRN: [id number]   CC: Stye in left eye   HPI: Patient is a 61 yo female who presents today complaining of stye on her left eye. Patient reports she noticed stye on her left eye on christmas day. The next day she applied warm compresses with some improvement. Patient reports she did not use any compresses yesterday and noted that her left eye was slightly more swollen and painful.  Patient presents today due to concern for infection.  Patient denies any past similar episodes.  Patient denies any fevers or chills, nausea and vomiting.  Smoking status reviewed   ROS: all other systems were reviewed and are negative other than in the HPI   Past Medical History:  Diagnosis Date  . Chronic atrial fibrillation ([place]) 1990s   s/p DCCV then attempted ablation of complex A Flutter (@ [facility] - Dr. Deno Etienne), failed antiarrhythmics --> INR folllwed @ [facility]  . Diabetes mellitus type 2, uncontrolled, without complications (HCC)    On Oral Medications ([facility])  . Essential hypertension   . History of cardiac catheterization 2001   R&LHC - normal Coronaries, no evidence of Restictive Cardiomyopathy or Constrictive Pericarditis (also @ [facility])  . Hx of sick sinus syndrome 07/1999   Wtih symptomatic bradycardia - syncope ([place])  . 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 CARDIAC CATHETERIZATION  08/03/1999   [place] - normal Coronaries; no sign of constriction or restrictive cardiomypathy  . MITRAL  VALVE REPAIR  07/1999   Both Ant& Post leaflet repair - quadrangular resection&caudal transposition, #32 Sequin Annuloplasty ring  . PACEMAKER GENERATOR CHANGE  11/26/2008   Medtronic Adapta L(? if it has been checked since 02/2013)  . PACEMAKER INSERTION  07/1999   for SSS in setting of Chronic Afib; Medtronic Kappa [id number], [id number] H.  . TRANSTHORACIC ECHOCARDIOGRAM  03/21/2013    Past medical history, surgical, family, and social history reviewed and updated in the EMR as appropriate.  Objective:  BP 132/76   Pulse 76   Temp 98.1 F (36.7 C) (Oral)   Ht 5\' 4"  (1.626 m)   Wt 152 lb (68.9 kg)   SpO2 96%   BMI 26.09 kg/m   Vitals and nursing note reviewed  General: NAD, pleasant, able to participate in exam HEENT: 1 cm abscess noted on the medial aspect of left lower eyelid.  No drainage. Increased puffiness and swelling extending down lower eyelid with some erythema. Visual acuity is intact Cardiac: RRR, normal heart sounds, no murmurs. 2+ radial and PT pulses bilaterally Respiratory: CTAB, normal effort, No wheezes, rales or rhonchi Abdomen: soft, nontender, nondistended, no hepatic or splenomegaly, +BS Extremities: no edema or cyanosis. WWP. Skin: warm and dry, no rashes noted Neuro: alert and oriented x4, no focal deficits Psych: Normal affect and mood   Assessment & Plan:   #Hordeolum, left eye, lower lid, acute, worsening Patient presents with left lower eyelid stye for the past 3 days.  Patient has tried  warm compresses for 1 day with some initial improvement.  For the past 2 days patient has noted worsening swelling and discomfort.  On exam, visual acuity is intact abscess noted on the medial aspect of left lower eyelid developing white head.  Patient also has moderate swelling of her left lower lid with some erythema concerning for cellulitis.  Patient denies any fevers or chills.  Likely developing preseptal cellulitis, no concern for orbital cellulitis. --Prescribed  erythromycin ointment for hordeolum --Prescribed clindamycin 300 mg 3 times daily and amoxicillin 875 mg twice daily for a week for developing preseptal cellulitis.   Marjie Skiff, MD [place] PGY-2

## 2017-08-10 ENCOUNTER — Ambulatory Visit (INDEPENDENT_AMBULATORY_CARE_PROVIDER_SITE_OTHER): Payer: BLUE CROSS/BLUE SHIELD | Admitting: *Deleted

## 2017-08-10 DIAGNOSIS — Z7901 Long term (current) use of anticoagulants: Secondary | ICD-10-CM | POA: Diagnosis not present

## 2017-08-10 DIAGNOSIS — I482 Chronic atrial fibrillation, unspecified: Secondary | ICD-10-CM

## 2017-08-10 LAB — POCT INR: INR: 2

## 2017-09-07 ENCOUNTER — Ambulatory Visit (INDEPENDENT_AMBULATORY_CARE_PROVIDER_SITE_OTHER): Payer: BLUE CROSS/BLUE SHIELD | Admitting: *Deleted

## 2017-09-07 DIAGNOSIS — Z7901 Long term (current) use of anticoagulants: Secondary | ICD-10-CM | POA: Diagnosis not present

## 2017-09-07 DIAGNOSIS — I482 Chronic atrial fibrillation, unspecified: Secondary | ICD-10-CM

## 2017-09-07 LAB — POCT INR: INR: 1.8

## 2017-09-21 ENCOUNTER — Ambulatory Visit (INDEPENDENT_AMBULATORY_CARE_PROVIDER_SITE_OTHER): Payer: BLUE CROSS/BLUE SHIELD | Admitting: *Deleted

## 2017-09-21 DIAGNOSIS — I482 Chronic atrial fibrillation, unspecified: Secondary | ICD-10-CM

## 2017-09-21 DIAGNOSIS — Z7901 Long term (current) use of anticoagulants: Secondary | ICD-10-CM

## 2017-09-21 LAB — POCT INR: INR: 2.1

## 2017-09-27 ENCOUNTER — Other Ambulatory Visit: Payer: Self-pay | Admitting: Family Medicine

## 2017-09-28 NOTE — Telephone Encounter (Signed)
Rx refilled, can patient please be informed that she will be due for a diabetes follow up in about 1-2 months if she can schedule at her convenience

## 2017-10-18 ENCOUNTER — Ambulatory Visit (INDEPENDENT_AMBULATORY_CARE_PROVIDER_SITE_OTHER): Payer: BLUE CROSS/BLUE SHIELD | Admitting: Family Medicine

## 2017-10-18 ENCOUNTER — Other Ambulatory Visit: Payer: Self-pay

## 2017-10-18 ENCOUNTER — Encounter: Payer: Self-pay | Admitting: Family Medicine

## 2017-10-18 VITALS — BP 140/72 | HR 74 | Temp 98.1°F | Ht 64.0 in | Wt 159.2 lb

## 2017-10-18 DIAGNOSIS — E1165 Type 2 diabetes mellitus with hyperglycemia: Secondary | ICD-10-CM | POA: Diagnosis not present

## 2017-10-18 DIAGNOSIS — E1169 Type 2 diabetes mellitus with other specified complication: Secondary | ICD-10-CM | POA: Diagnosis not present

## 2017-10-18 DIAGNOSIS — E785 Hyperlipidemia, unspecified: Secondary | ICD-10-CM

## 2017-10-18 DIAGNOSIS — Z7901 Long term (current) use of anticoagulants: Secondary | ICD-10-CM

## 2017-10-18 DIAGNOSIS — E119 Type 2 diabetes mellitus without complications: Secondary | ICD-10-CM

## 2017-10-18 DIAGNOSIS — Z1239 Encounter for other screening for malignant neoplasm of breast: Secondary | ICD-10-CM

## 2017-10-18 DIAGNOSIS — Z0000 Encounter for general adult medical examination without abnormal findings: Secondary | ICD-10-CM

## 2017-10-18 DIAGNOSIS — I482 Chronic atrial fibrillation, unspecified: Secondary | ICD-10-CM

## 2017-10-18 DIAGNOSIS — Z1231 Encounter for screening mammogram for malignant neoplasm of breast: Secondary | ICD-10-CM | POA: Diagnosis not present

## 2017-10-18 LAB — POCT GLYCOSYLATED HEMOGLOBIN (HGB A1C): HEMOGLOBIN A1C: 8.5

## 2017-10-18 LAB — POCT INR: INR: 2.3

## 2017-10-18 MED ORDER — WARFARIN SODIUM 5 MG PO TABS: ORAL_TABLET | ORAL | 2 refills | Status: DC

## 2017-10-18 MED ORDER — METFORMIN HCL ER (MOD) 1000 MG PO TB24: 1000.0000 mg | ORAL_TABLET | Freq: Every day | ORAL | 2 refills | Status: DC

## 2017-10-18 NOTE — Assessment & Plan Note (Signed)
Goal is INR between 2 and 3. Doing well.

## 2017-10-18 NOTE — Patient Instructions (Addendum)
It was good to see you today!  Metformin 1000mg  once a day with your biggest meal. Exercise and healthy eating are 2 goals we talked about today, I believe in you!  We are checking some labs today. If results require attention, either myself or my nurse will get in touch with you. If everything is normal, you will get a letter in the mail or a message in My Chart. Please give [place] a call if you do not hear from [place] after 2 weeks.  Get your mammogram.  Please get your diabetic eye exam and I would appreciate it if they could send me a copy of the results.  Please check-out at the front desk before leaving the clinic. Make an appointment in  3 months for diabetes follow up.  Please bring all of your medications with you to each visit.   Sign up for My Chart to have easy access to your labs results, and communication with your primary care physician.  Feel free to call with any questions or concerns at any time, at [phone].   Take care,  Dr. Bufford Lope, [place]

## 2017-10-18 NOTE — Assessment & Plan Note (Signed)
Uncontrolled.  A1c now 8.5 from 6.6.  Likely due to a combination of medication noncompliance, diet noncompliance, and lack of exercise.  Patient was agreeable to increasing metformin and discussed going to 1000 mg daily with breakfast based on her biggest meal of the day. She would also like to stay on glimepiride and is not interested in it and SGLT-2 although she does have ASCVD risk with her hyperlipidemia.  He was also counseled on healthy diet and exercise.  Check BMP today.  Follow-up in 3 months.

## 2017-10-18 NOTE — Progress Notes (Signed)
Subjective:  Krystal Little is a 62 y.o. female who presents to the [facility] today for diabetes follow up  HPI:  Diabetes Medication compliance: Has not been compliant in her home metformin.  She was previously on metformin twice a day but decrease to once a day because she has been having difficulty with diarrhea.  She thinks that this is because she has not been eating as much in the evenings as she used to.  She states that she is compliant on her glimepiride 2 mg daily. 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 She also used to walk daily with her daughter but has not been exercising over the last few months. Further diabetic ROS: no polyuria or polydipsia, no chest pain, dyspnea or TIA's, no numbness, tingling or pain in extremities, no unusual visual symptoms, no hypoglycemia, weight has increased. Last eye exam has been greater than 1 year ago.    Coumadin use States that she has been compliant on Coumadin taking 5 mg daily except for 7.5 mg on Mondays and Fridays.  She has been coming to her regularly scheduled lab appointments. Has not had any bruising or bleeding.  Hyperlipidemia Patient states that she is still reluctant to trial any lipid lowering medications.  She does not want to discuss starting any additional medications because she does not want to be on more pills     ROS: Per HPI  PMH: She reports that she has never smoked. She has never used smokeless tobacco. She reports that she drinks alcohol. She reports that she does not use drugs.  Objective:  Physical Exam: BP 140/72   Pulse 74   Temp 98.1 F (36.7 C) (Oral)   Ht 5\' 4"  (1.626 m)   Wt 159 lb 3.2 oz (72.2 kg)   SpO2 98%   BMI 27.33 kg/m   Gen: NAD, resting comfortably CV: RRR with no murmurs  appreciated Pulm: NWOB, CTAB with no crackles, wheezes, or rhonchi GI: Normal bowel sounds present. Soft, Nontender, Nondistended. MSK: no edema, cyanosis, or clubbing noted Skin: warm, dry Neuro: grossly normal, moves all extremities Psych: Normal affect and thought content  Results for orders placed or performed in visit on 10/18/17 (from the past 72 hour(s))  HgB A1c     Status: Abnormal   Collection Time: 10/18/17  8:59 AM  Result Value Ref Range   Hemoglobin A1C 8.5   POCT INR     Status: None   Collection Time: 10/18/17  9:05 AM  Result Value Ref Range   INR 2.3      Assessment/Plan:  Diabetes mellitus type 2, controlled, without complications (HCC) Uncontrolled.  A1c now 8.5 from 6.6.  Likely due to a combination of medication noncompliance, diet noncompliance, and lack of exercise.  Patient was agreeable to increasing metformin and discussed going to 1000 mg daily with breakfast based on her biggest meal of the day. She would also like to stay on glimepiride and is not interested in it and SGLT-2 although she does have ASCVD risk with her hyperlipidemia.  He was also counseled on healthy diet and exercise.  Check BMP today.  Follow-up in 3 months.  Long term current use of anticoagulant therapy Goal is INR between 2 and  3. Doing well.  Hyperlipidemia associated with type 2 diabetes mellitus ([place]) Patient is still refusing statin.  She is not interested in any other lipid lowering medications today.    Bufford Lope, DO PGY-2, [facility] 10/18/2017 9:34 AM

## 2017-10-18 NOTE — Assessment & Plan Note (Signed)
Patient is still refusing statin.  She is not interested in any other lipid lowering medications today.

## 2017-10-19 ENCOUNTER — Ambulatory Visit: Payer: BLUE CROSS/BLUE SHIELD

## 2017-10-19 LAB — BASIC METABOLIC PANEL
BUN/Creatinine Ratio: 15 (ref 12–28)
BUN: 11 mg/dL (ref 8–27)
CO2: 26 mmol/L (ref 20–29)
CREATININE: 0.71 mg/dL (ref 0.57–1.00)
Calcium: 9 mg/dL (ref 8.7–10.3)
Chloride: 103 mmol/L (ref 96–106)
GFR calc Af Amer: 105 mL/min/{1.73_m2} (ref >59)
GFR, EST NON AFRICAN AMERICAN: 91 mL/min/{1.73_m2} (ref >59)
GLUCOSE: 295 mg/dL — AB (ref 65–99)
Potassium: 4.3 mmol/L (ref 3.5–5.2)
Sodium: 142 mmol/L (ref 134–144)

## 2017-10-22 ENCOUNTER — Encounter: Payer: Self-pay | Admitting: Family Medicine

## 2017-10-23 ENCOUNTER — Telehealth: Payer: Self-pay

## 2017-10-23 DIAGNOSIS — E1165 Type 2 diabetes mellitus with hyperglycemia: Secondary | ICD-10-CM

## 2017-10-23 NOTE — Telephone Encounter (Signed)
Completed PA info in CoverMyMeds for metformin.  Status pending.  Will recheck status in 24 hours. Wallace Cullens, RN

## 2017-10-29 NOTE — Telephone Encounter (Signed)
PA for metformin was denied per [employer]. Will forward to MD. Wallace Cullens, RN

## 2017-10-29 NOTE — Telephone Encounter (Signed)
We don't have a formulary for [place], they should be sending a fax with more information on next steps if there are any, but otherwise I'm not sure what else to do.

## 2017-10-29 NOTE — Telephone Encounter (Signed)
Is there a formulary or next steps regarding patient's metformin?

## 2017-10-30 MED ORDER — METFORMIN HCL ER (OSM) 1000 MG PO TB24: 1000.0000 mg | ORAL_TABLET | Freq: Every day | ORAL | 2 refills | Status: DC

## 2017-10-30 NOTE — Telephone Encounter (Signed)
[employer] preferred is glucophage ER and fortamet. Fortamet sent to patient pharmacy.

## 2017-11-07 ENCOUNTER — Other Ambulatory Visit: Payer: Self-pay | Admitting: Family Medicine

## 2017-11-07 DIAGNOSIS — E1165 Type 2 diabetes mellitus with hyperglycemia: Secondary | ICD-10-CM

## 2017-11-07 MED ORDER — METFORMIN HCL ER (OSM) 500 MG PO TB24: 1000.0000 mg | ORAL_TABLET | Freq: Two times a day (BID) | ORAL | 2 refills | Status: DC

## 2017-11-07 NOTE — Telephone Encounter (Signed)
[employer] not covering fortamet either. It seems it has to do with being written for 1000mg - can you send in 500mg  to take twice daily? Thanks Wallace Cullens, RN

## 2017-11-07 NOTE — Telephone Encounter (Signed)
Covering for Dr. Shawna Orleans this week.  I have sent in Fortamet 500 mg BID to patient pharmacy as requested. Thanks

## 2017-11-15 ENCOUNTER — Ambulatory Visit (INDEPENDENT_AMBULATORY_CARE_PROVIDER_SITE_OTHER): Payer: BLUE CROSS/BLUE SHIELD | Admitting: *Deleted

## 2017-11-15 DIAGNOSIS — I482 Chronic atrial fibrillation, unspecified: Secondary | ICD-10-CM

## 2017-11-15 DIAGNOSIS — Z7901 Long term (current) use of anticoagulants: Secondary | ICD-10-CM

## 2017-11-15 LAB — POCT INR: INR: 2.4

## 2017-11-16 NOTE — Telephone Encounter (Signed)
Rx for 500 mg also denied, call insurance, will cover the "non osmotic" metfromin rx- [facility] aware and medication went through. Wallace Cullens, RN

## 2017-11-16 NOTE — Telephone Encounter (Signed)
No they were able to take a verbal over the phone, I just wanted you to be aware of the change. Thanks! Wallace Cullens, RN

## 2017-11-16 NOTE — Telephone Encounter (Signed)
Do I need to send in an additional Rx?

## 2017-12-26 ENCOUNTER — Ambulatory Visit (INDEPENDENT_AMBULATORY_CARE_PROVIDER_SITE_OTHER): Payer: BLUE CROSS/BLUE SHIELD | Admitting: *Deleted

## 2017-12-26 DIAGNOSIS — Z7901 Long term (current) use of anticoagulants: Secondary | ICD-10-CM

## 2017-12-26 LAB — POCT INR: INR: 3 (ref 2.0–3.0)

## 2018-01-05 ENCOUNTER — Other Ambulatory Visit: Payer: Self-pay | Admitting: Family Medicine

## 2018-01-22 ENCOUNTER — Ambulatory Visit: Payer: BLUE CROSS/BLUE SHIELD | Admitting: Family Medicine

## 2018-01-23 ENCOUNTER — Ambulatory Visit (INDEPENDENT_AMBULATORY_CARE_PROVIDER_SITE_OTHER): Payer: BLUE CROSS/BLUE SHIELD | Admitting: *Deleted

## 2018-01-23 ENCOUNTER — Ambulatory Visit: Payer: BLUE CROSS/BLUE SHIELD

## 2018-01-23 DIAGNOSIS — Z7901 Long term (current) use of anticoagulants: Secondary | ICD-10-CM | POA: Diagnosis not present

## 2018-01-23 DIAGNOSIS — I482 Chronic atrial fibrillation, unspecified: Secondary | ICD-10-CM

## 2018-01-23 LAB — POCT INR: INR: 1.6 — AB (ref 2.0–3.0)

## 2018-01-23 NOTE — Progress Notes (Signed)
Patient took 5 mg instead of the 7.5 mg on Fri and Mon. Also has been eating greens. Instructed to take coumadin as directed and to be careful with green leafy vegetable. [place], Kevin [place]

## 2018-02-04 ENCOUNTER — Ambulatory Visit: Payer: BLUE CROSS/BLUE SHIELD | Admitting: Family Medicine

## 2018-02-04 ENCOUNTER — Encounter: Payer: Self-pay | Admitting: Cardiology

## 2018-02-04 ENCOUNTER — Other Ambulatory Visit: Payer: Self-pay

## 2018-02-04 ENCOUNTER — Encounter: Payer: Self-pay | Admitting: Family Medicine

## 2018-02-04 VITALS — BP 108/61 | HR 84 | Temp 98.0°F | Wt 161.0 lb

## 2018-02-04 DIAGNOSIS — Z7901 Long term (current) use of anticoagulants: Secondary | ICD-10-CM

## 2018-02-04 DIAGNOSIS — E1165 Type 2 diabetes mellitus with hyperglycemia: Secondary | ICD-10-CM | POA: Diagnosis not present

## 2018-02-04 DIAGNOSIS — E119 Type 2 diabetes mellitus without complications: Secondary | ICD-10-CM

## 2018-02-04 DIAGNOSIS — I482 Chronic atrial fibrillation, unspecified: Secondary | ICD-10-CM

## 2018-02-04 LAB — POCT GLYCOSYLATED HEMOGLOBIN (HGB A1C): HbA1c, POC (controlled diabetic range): 8 % — AB (ref 0.0–7.0)

## 2018-02-04 LAB — POCT INR: INR: 2.2 (ref 2.0–3.0)

## 2018-02-04 MED ORDER — CANAGLIFLOZIN 100 MG PO TABS: 100.0000 mg | ORAL_TABLET | Freq: Every day | ORAL | 2 refills | Status: DC

## 2018-02-04 NOTE — Assessment & Plan Note (Signed)
Still uncontrolled at a1c 8.0.  Patient appears to be on extended release metformin so change from 500 twice daily to 1000 daily with her biggest meal which is at lunchtime.  Hopeful that this will increase her medication compliance.  Continue glimepiride 2 mg in the morning.  Patient agreeable to starting SGLT2 if A1c was elevated, so Invokana 100 mg daily sent to patient pharmacy.  Message left for patient informing her of this prescription.  Follow-up in 3 months

## 2018-02-04 NOTE — Progress Notes (Signed)
    Subjective:  Krystal Little is a 62 y.o. female who presents to the [facility] today for diabetes  HPI:  Diabetes Metformin 500mg  BID, Does not have issues with lunchtime metformin because takes with food. The evening metformin dose will sometimes cause diarrhea if she does not eat with food. She is rarely hungry in the evenings.  Picked up new refill, might be the XR.  Also takes glimepiride in the morning Has been trying to eat healthier but has not drastically changed her diet lately as she states that she has always eaten pretty healthy.  She has been avoiding greens lately as her INR has been quite labile. She has not been exercising much lately due to the heat. She states her diabetes control is important that she does not want to lose any limits her vision.  She is willing to be on more medication to achieve this goal however this is at odds with her desire to be on as little medication as possible. No chest pain, shortness of breath, vision changes, polyuria, polydipsia, lightheadedness, dizziness.  ROS: Per HPI  Objective:  Physical Exam: BP 108/61   Pulse 84   Temp 98 F (36.7 C) (Oral)   Wt 161 lb (73 kg)   SpO2 97%   BMI 27.64 kg/m   Gen: NAD, resting comfortably CV: RRR with no murmurs appreciated Pulm: NWOB, CTAB with no crackles, wheezes, or rhonchi GI: Normal bowel sounds present. Soft, Nontender, Nondistended. MSK: no edema, cyanosis, or clubbing noted Skin: warm, dry Neuro: grossly normal, moves all extremities Psych: Normal affect and thought content  Results for orders placed or performed in visit on 02/04/18 (from the past 72 hour(s))  HgB A1c     Status: Abnormal   Collection Time: 02/04/18  4:20 PM  Result Value Ref Range   Hemoglobin A1C  4.0 - 5.6 %   HbA1c POC (<> result, manual entry)  4.0 - 5.6 %   HbA1c, POC (prediabetic range)  5.7 - 6.4 %   HbA1c, POC (controlled diabetic range) 8.0 (A) 0.0 - 7.0 %  POCT INR     Status: None   Collection  Time: 02/04/18  4:35 PM  Result Value Ref Range   INR 2.2 2.0 - 3.0     Assessment/Plan:  DM (diabetes mellitus), type 2, uncontrolled ([place]) Still uncontrolled at a1c 8.0.  Patient appears to be on extended release metformin so change from 500 twice daily to 1000 daily with her biggest meal which is at lunchtime.  Hopeful that this will increase her medication compliance.  Continue glimepiride 2 mg in the morning.  Patient agreeable to starting SGLT2 if A1c was elevated, so Invokana 100 mg daily sent to patient pharmacy.  Message left for patient informing her of this prescription.  Follow-up in 3 months   Bufford Lope, DO PGY-3, [facility] 02/04/2018 3:55 PM

## 2018-02-04 NOTE — Patient Instructions (Signed)
It was good to see you today!  We will check your a1c today, if it is still elevated then we will start invokana. If it is still good, then metformin 1000mg  daily with lunchtime and glimepiride 5mg  daily with breakfast.    Please bring all of your medications with you to each visit.   Sign up for My Chart to have easy access to your labs results, and communication with your primary care physician.  Feel free to call with any questions or concerns at any time, at [phone].   Take care,  Dr. Bufford Lope, DO [facility]    Diet Recommendations for Diabetes  Carbohydrate includes starch, sugar, and fiber.  Of these, only sugar and starch raise blood glucose.  (Fiber is found in fruits, vegetables [especially skin, seeds, and stalks] and whole grains.)   Starchy (carb) foods: Bread, rice, pasta, potatoes, corn, cereal, grits, crackers, bagels, muffins, all baked goods.  (Fruit, milk, and yogurt also have carbohydrate, but most of these foods will not spike your blood sugar as most starchy foods will.)  A few fruits do cause high blood sugars; use small portions of bananas (limit to 1/2 at a time), grapes, watermelon, oranges, and most tropical fruits.   Protein foods: Meat, fish, poultry, eggs, dairy foods, and beans such as pinto and kidney beans (beans also provide carbohydrate).   1. Eat at least REAL 3 meals and 1-2 snacks per day. Never go more than 4-5 hours while awake without eating. Eat breakfast within the first hour of getting up.   2. Limit starchy foods to TWO per meal and ONE per snack. ONE portion of a starchy  food is equal to the following:   - ONE slice of bread (or its equivalent, such as half of a hamburger bun).   - 1/2 cup of a "scoopable" starchy food such as potatoes or rice.   - 15 grams of Total Carbohydrate as shown on food label.  3. Include at every meal: a protein food, a carb food, and vegetables and/or fruit.   - Obtain twice the volume of  veg's as protein or carbohydrate foods for both lunch and dinner.   - Fresh or frozen veg's are best.   - Keep frozen veg's on hand for a quick vegetable serving.

## 2018-02-16 ENCOUNTER — Other Ambulatory Visit: Payer: Self-pay | Admitting: Family Medicine

## 2018-02-16 DIAGNOSIS — Z7901 Long term (current) use of anticoagulants: Secondary | ICD-10-CM

## 2018-03-04 ENCOUNTER — Ambulatory Visit (INDEPENDENT_AMBULATORY_CARE_PROVIDER_SITE_OTHER): Payer: BLUE CROSS/BLUE SHIELD | Admitting: *Deleted

## 2018-03-04 DIAGNOSIS — Z7901 Long term (current) use of anticoagulants: Secondary | ICD-10-CM | POA: Diagnosis not present

## 2018-03-04 DIAGNOSIS — I482 Chronic atrial fibrillation, unspecified: Secondary | ICD-10-CM

## 2018-03-04 LAB — POCT INR: INR: 2.1 (ref 2.0–3.0)

## 2018-03-13 ENCOUNTER — Other Ambulatory Visit: Payer: Self-pay | Admitting: Cardiology

## 2018-03-14 NOTE — Telephone Encounter (Signed)
Rx sent to pharmacy   

## 2018-04-01 ENCOUNTER — Ambulatory Visit: Payer: BLUE CROSS/BLUE SHIELD

## 2018-04-15 ENCOUNTER — Ambulatory Visit: Payer: BLUE CROSS/BLUE SHIELD

## 2018-04-23 ENCOUNTER — Ambulatory Visit (INDEPENDENT_AMBULATORY_CARE_PROVIDER_SITE_OTHER): Payer: BLUE CROSS/BLUE SHIELD | Admitting: *Deleted

## 2018-04-23 ENCOUNTER — Ambulatory Visit (INDEPENDENT_AMBULATORY_CARE_PROVIDER_SITE_OTHER): Payer: BLUE CROSS/BLUE SHIELD

## 2018-04-23 DIAGNOSIS — Z23 Encounter for immunization: Secondary | ICD-10-CM | POA: Diagnosis not present

## 2018-04-23 DIAGNOSIS — I482 Chronic atrial fibrillation, unspecified: Secondary | ICD-10-CM

## 2018-04-23 DIAGNOSIS — Z7901 Long term (current) use of anticoagulants: Secondary | ICD-10-CM | POA: Diagnosis not present

## 2018-04-23 LAB — POCT INR: INR: 2.2 (ref 2.0–3.0)

## 2018-04-23 NOTE — Progress Notes (Signed)
Pt presents in nurse clinic for Flu Vaccine. Injection given in LD, site unremarkable. Epic and [facility] updated.

## 2018-05-03 ENCOUNTER — Encounter (HOSPITAL_COMMUNITY): Payer: Self-pay | Admitting: Cardiology

## 2018-05-09 ENCOUNTER — Ambulatory Visit: Payer: BLUE CROSS/BLUE SHIELD | Admitting: Cardiology

## 2018-05-09 ENCOUNTER — Encounter: Payer: Self-pay | Admitting: Cardiology

## 2018-05-09 VITALS — BP 126/64 | HR 75 | Ht 64.0 in | Wt 150.0 lb

## 2018-05-09 DIAGNOSIS — Z950 Presence of cardiac pacemaker: Secondary | ICD-10-CM

## 2018-05-09 DIAGNOSIS — I482 Chronic atrial fibrillation, unspecified: Secondary | ICD-10-CM | POA: Diagnosis not present

## 2018-05-09 NOTE — Progress Notes (Signed)
Electrophysiology Office Note   Date:  05/10/2018   ID:  Krystal Little, DOB [birth year], MRN [id number]  PCP:  Bufford Lope, DO  Cardiologist:  Glenetta Hew Primary Electrophysiologist:  Gorgeous [place]un Meredith Leeds, MD    No chief complaint on file.    History of Present Illness: Krystal Little is a 62 y.o. female who presents today for electrophysiology evaluation.      She has a history of rheumatic heart disease the resulted in marked moderate to severe mitral regurgitation or prolapse. In December 2000 she presented with syncope and sick sinus syndrome. She underwent mitral valve repair with a ring annuloplasty and transposition of chordae followed by pacemaker placement. She has chronic atrial fibrillation as well as atrial flutter with an attempted ablation of complex atrial flutter awake forest. She has tried multiple antiarrhythmics and failed Sotalol and  Dofetilide.   Today, denies symptoms of palpitations, chest pain, shortness of breath, orthopnea, PND, lower extremity edema, claudication, dizziness, presyncope, syncope, bleeding, or neurologic sequela. The patient is tolerating medications without difficulties.     Past Medical History:  Diagnosis Date  . Chronic atrial fibrillation 1990s   s/p DCCV then attempted ablation of complex A Flutter (@ [facility] - Dr. Deno Etienne), failed antiarrhythmics --> INR folllwed @ [facility]  . Diabetes mellitus type 2, uncontrolled, without complications (HCC)    On Oral Medications ([facility])  . Essential hypertension   . History of cardiac catheterization 2001   R&LHC - normal Coronaries, no evidence of Restictive Cardiomyopathy or Constrictive Pericarditis (also @ [facility])  . Hx of sick sinus syndrome 07/1999   Wtih symptomatic bradycardia - syncope (Tachy-Brady)  . S/P MVR (mitral valve repair) 08/09/1999   H/o Rheumativ MV disease with Proloapse & Mod-Severe MR --> Ant&Post Leaflet resection/repair wiht Ring Annuloplasty;; Echo 9/'14: MV  ring prosthesis well seated, mild restriction of Post MV leaflet, Mlid MR w/o MS, EF 50-55% - Gr1 DD, severe LA dilation, Mod-severe RA dilation, trivial AI & Mod TR (PAP ~35 mmHg)  . 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 CARDIAC CATHETERIZATION  08/03/1999   [place] - normal Coronaries; no sign of constriction or restrictive cardiomypathy  . MITRAL VALVE REPAIR  07/1999   Both Ant& Post leaflet repair - quadrangular resection&caudal transposition, #32 Sequin Annuloplasty ring  . PACEMAKER GENERATOR CHANGE  11/26/2008   Medtronic Adapta L(? if it has been checked since 02/2013)  . PACEMAKER INSERTION  07/1999   for SSS in setting of Chronic Afib; Medtronic Kappa [id number], [id number] H.  . TRANSTHORACIC ECHOCARDIOGRAM  03/21/2013     Current Outpatient Medications  Medication Sig Dispense Refill  . atenolol (TENORMIN) 50 MG tablet Take 1 tablet (50 mg total) daily by mouth. 90 tablet 3  . CARTIA XT 180 MG 24 hr capsule TAKE 1 CAPSULE(180 MG) BY MOUTH DAILY 90 capsule 0  . lisinopril (PRINIVIL,ZESTRIL) 5 MG tablet TAKE 1 TABLET(5 MG) BY MOUTH DAILY 90 tablet 3  . metFORMIN (GLUCOPHAGE-XR) 500 MG 24 hr tablet Take 500 mg by mouth 2 (two) times daily.   2  . warfarin (COUMADIN) 5 MG tablet TAKE 1 TABLET BY MOUTH DAILY EXCEPT TAKE 1 AND 1/2 TABLET ON MONDAY AND FRIDAY 45 tablet 3   No current facility-administered medications for this visit.     Allergies:   Patient has no known allergies.   Social History:  The patient  reports that she has  never smoked. She has never used smokeless tobacco. She reports that she drinks alcohol. She reports that she does not use drugs.   Family History:  The patient's family history includes Cancer in her brother; Diabetes in her sister; Hypertension in her mother; Lung cancer in her brother.   ROS:  Please see the history of present illness.   Otherwise, review of systems is positive for palpitations.    All other systems are reviewed and negative.   PHYSICAL EXAM: VS:  BP 126/64   Pulse 75   Ht 5\' 4"  (1.626 m)   Wt 150 lb (68 kg)   BMI 25.75 kg/m  , BMI Body mass index is 25.75 kg/m. GEN: Well nourished, well developed, in no acute distress  HEENT: normal  Neck: no JVD, carotid bruits, or masses Cardiac: RRR; no murmurs, rubs, or gallops,no edema  Respiratory:  clear to auscultation bilaterally, normal work of breathing GI: soft, nontender, nondistended, + BS MS: no deformity or atrophy  Skin: warm and dry, device site well healed Neuro:  Strength and sensation are intact Psych: euthymic mood, full affect  EKG:  EKG is ordered today. Personal review of the ekg ordered shows AF, ventricular paced  Personal review of the device interrogation today. Results in [place]: 10/18/2017: BUN 11; Creatinine, Ser 0.71; Potassium 4.3; Sodium 142    Lipid Panel     Component Value Date/Time   CHOL 176 05/18/2016 0919   TRIG 87 05/18/2016 0919   HDL 52 05/18/2016 0919   CHOLHDL 3.4 05/18/2016 0919   VLDL 17 05/18/2016 0919   LDLCALC 107 05/18/2016 0919   LDLDIRECT 107 (H) 10/02/2012 1018     Wt Readings from Last 3 Encounters:  05/09/18 150 lb (68 kg)  02/04/18 161 lb (73 kg)  10/18/17 159 lb 3.2 oz (72.2 kg)      Other studies Reviewed: Additional studies/ records that were reviewed today include: TTE 06/12/15  Review of the above records today demonstrates:  - Left ventricle: The cavity size was normal. There was mild concentric hypertrophy. Systolic function was normal. The estimated ejection fraction was in the range of 55% to 60%. Wall motion was normal; there were no regional wall motion abnormalities. Doppler parameters are consistent with a reversible restrictive pattern, indicative of decreased left ventricular diastolic compliance and/or increased left atrial pressure (grade 3 diastolic dysfunction). Doppler parameters  are consistent with high ventricular filling pressure. - Aortic valve: Calcified annulus. Trileaflet; normal thickness leaflets. There was trivial regurgitation. - Mitral valve: Calcified annulus. Mobility of the posterior leaflet was restricted. The findings are consistent with mild stenosis. There was mild regurgitation. Mean gradient (D): 6 mm Hg. Valve area by pressure half-time: 1.96 cm^2. - Left atrium: The atrium was severely dilated. - Right ventricle: The cavity size was mildly to moderately dilated. Wall thickness was normal. Systolic function was moderately reduced. - Right atrium: The atrium was mildly dilated. - Atrial septum: No defect or patent foramen ovale was identified. - Tricuspid valve: There was moderate regurgitation. - Inferior vena cava: The vessel was dilated. The respirophasic diameter changes were blunted (< 50%), consistent with elevated central venous pressure.  ASSESSMENT AND PLAN:  1.  Atrial fibrillation: On Coumadin for MVR as well as atrial fibrillation.  On lifelong anticoagulation.  Not symptomatic from atrial fibrillation at this point.  This patients CHA2DS2-VASc Score and unadjusted Ischemic Stroke Rate (% per year) is equal to 3.2 % stroke rate/year from a score of  3  Above score calculated as 1 point each if present [CHF, HTN, DM, Vascular=MI/PAD/Aortic Plaque, Age if 65-74, or Female] Above score calculated as 2 points each if present [Age > 75, or Stroke/TIA/TE]  2.  Sick sinus syndrome s/p pacemaker: Medtronic pacemaker functioning appropriately.  No changes.  3. Mitral valve replacement: Mild stenosis on TTE.  Continue following.  3. Hypertension: Well-controlled.  Current medicines are reviewed at length with the patient today.   The patient does not have concerns regarding her medicines.  The following changes were made today:  none  Labs/ tests ordered today include:  No orders of the defined types were placed  in this encounter.    Disposition:   FU with Ashlyn Cabler 1 year  Signed, Daylen Lipsky Meredith Leeds, MD  05/10/2018 8:24 AM     [address] [place] [address] [phone] (office) [phone] (fax)

## 2018-05-10 ENCOUNTER — Encounter: Payer: Self-pay | Admitting: Cardiology

## 2018-05-14 ENCOUNTER — Encounter: Payer: Self-pay | Admitting: Gynecology

## 2018-05-14 ENCOUNTER — Ambulatory Visit: Payer: BLUE CROSS/BLUE SHIELD | Admitting: Gynecology

## 2018-05-14 VITALS — BP 120/80 | Ht 63.0 in | Wt 150.0 lb

## 2018-05-14 DIAGNOSIS — N952 Postmenopausal atrophic vaginitis: Secondary | ICD-10-CM

## 2018-05-14 DIAGNOSIS — D179 Benign lipomatous neoplasm, unspecified: Secondary | ICD-10-CM

## 2018-05-14 DIAGNOSIS — D259 Leiomyoma of uterus, unspecified: Secondary | ICD-10-CM | POA: Diagnosis not present

## 2018-05-14 DIAGNOSIS — Z01411 Encounter for gynecological examination (general) (routine) with abnormal findings: Secondary | ICD-10-CM | POA: Diagnosis not present

## 2018-05-14 DIAGNOSIS — N898 Other specified noninflammatory disorders of vagina: Secondary | ICD-10-CM

## 2018-05-14 LAB — WET PREP FOR TRICH, YEAST, CLUE

## 2018-05-14 MED ORDER — FLUCONAZOLE 150 MG PO TABS: 150.0000 mg | ORAL_TABLET | Freq: Once | ORAL | 0 refills | Status: AC

## 2018-05-14 NOTE — Patient Instructions (Signed)
Follow-up for bone density as scheduled  Schedule your colonoscopy with either:  [facility]   Address: [address], [place], [place] [address]  [phone]    or  [facility]  Address: [address], [place], [place] [address]  [phone]      Call to Schedule your mammogram  Facilities in [place]: 1)  [place] [place] Phone: [phone] 2)  Dr. Isaiah Blakes at [facility] [address] Phone: [phone]     Mammogram A mammogram is an X-ray test to find changes in a woman's breast. You should get a mammogram if:  You are 96 years of age or older  You have risk factors.   Your doctor recommends that you have one.  BEFORE THE TEST  Do not schedule the test the week before your period, especially if your breasts are sore during this time.  On the day of your mammogram:  Wash your breasts and armpits well. After washing, do not put on any deodorant or talcum powder on until after your test.   Eat and drink as you usually do.   Take your medicines as usual.   If you are diabetic and take insulin, make sure you:   Eat before coming for your test.   Take your insulin as usual.   If you cannot keep your appointment, call before the appointment to cancel. Schedule another appointment.  TEST  You will need to undress from the waist up. You will put on a hospital gown.   Your breast will be put on the mammogram machine, and it will press firmly on your breast with a piece of plastic called a compression paddle. This will make your breast flatter so that the machine can X-ray all parts of your breast.   Both breasts will be X-rayed. Each breast will be X-rayed from above and from the side. An X-ray might need to be taken again if the picture is not good enough.   The mammogram will last about 15 to 30 minutes.  AFTER THE TEST Finding  out the results of your test Ask when your test results will be ready. Make sure you get your test results.  Document Released: 09/29/2008 Document Revised: 06/22/2011 Document Reviewed: 09/29/2008 [facility] Patient Information 2012 [place].

## 2018-05-14 NOTE — Addendum Note (Signed)
Addended by: Nelva Nay on: 05/14/2018 10:22 AM   Modules accepted: Orders

## 2018-05-14 NOTE — Progress Notes (Signed)
    JAYNEE WINTERS 1956/06/30 [id number]        62 y.o.  G2P2001 for annual gynecologic exam.  Also complaining of several weeks of vaginal itching.  No discharge or odor.  No urinary symptoms such as frequency dysuria urgency low back pain fever or chills.  Past medical history,surgical history, problem list, medications, allergies, family history and social history were all reviewed and documented as reviewed in the EPIC chart.  ROS:  Performed with pertinent positives and negatives included in the history, assessment and plan.   Additional significant findings : None   Exam: Caryn Bee assistant Vitals:   05/14/18 0821  BP: 120/80  Weight: 150 lb (68 kg)  Height: 5\' 3"  (1.6 m)   Body mass index is 26.57 kg/m.  General appearance:  Normal affect, orientation and appearance. Skin: Grossly normal HEENT: Without gross lesions.  No cervical or supraclavicular adenopathy. Thyroid normal.  Lungs:  Clear without wheezing, rales or rhonchi Cardiac: RR, without RMG Abdominal:  Soft, nontender, without masses, guarding, rebound, organomegaly or hernia Breasts:  Examined lying and sitting without masses, retractions, discharge or axillary adenopathy. Pelvic:  Ext, BUS, Vagina: With atrophic changes, slight white discharge noted.  Pedunculated lipomatous mass left lateral mons pubis with at junction with thigh.  Cervix: With atrophic changes  Uterus: Grossly normal size midline mobile nontender  Adnexa: Without masses or tenderness    Anus and perineum: Normal   Rectovaginal: Normal sphincter tone without palpated masses or tenderness.    Assessment/Plan:  62 y.o. G2P2001 female for annual gynecologic exam.   1. Vaginal itching.  Exam without evidence of dermatitis/lesions.  Wet prep was negative.  Will cover for yeast with Diflucan 150 mg x 1 dose.  Follow-up if symptoms persist, worsen or recur.  We did discuss possible interaction between Diflucan and Coumadin but I think as an  isolated one-time dose should be okay. 2. History of leiomyoma on ultrasound.  History of vaginal spotting last year.  No bleeding since.  Exam shows uterus grossly normal in size.  Continue leiomyoma surveillance with annual exams.  Call if any vaginal bleeding. 3. Lipomatous mass left mons pubis.  Present for years.  Discussed and offered excision.  Patient not interested as it does not bother her.  Continue to monitor and if would significantly enlarge follow-up for excision. 4. Postmenopausal.  No significant menopausal symptoms. 5. Mammography 2012.  Most common cancer in women stressed and need to schedule screening mammogram.  Names and numbers provided.  Breast exam normal today. 6. Colonoscopy over 10 years ago.  Second most common cancer in women stressed.  Need to schedule screening colonoscopy reviewed.  Names and numbers provided. 7. Pap smear 2018.  No Pap smear done today.  No history of significant abnormal Pap smears.  Plan repeat Pap smear at 3-year interval per current screening guidelines. 8. DEXA never.  Recommended baseline DEXA.  Patient will schedule in follow-up for this. 9. Health maintenance.  No routine lab work done as patient does this elsewhere.  Follow-up 1 year, sooner as needed.   Anastasio Auerbach MD, 8:36 AM 05/14/2018

## 2018-05-15 LAB — CUP PACEART INCLINIC DEVICE CHECK
Battery Impedance: 1049 Ohm
Battery Remaining Longevity: 57 mo
Battery Voltage: 2.78 V
Brady Statistic RV Percent Paced: 78 %
Date Time Interrogation Session: 20191024201901
Implantable Lead Implant Date: 20100513
Implantable Lead Implant Date: 20100513
Implantable Lead Location: 753859
Implantable Lead Location: 753860
Implantable Lead Model: 4092
Implantable Lead Model: 4592
Implantable Pulse Generator Implant Date: 20100513
Lead Channel Impedance Value: 531 Ohm
Lead Channel Impedance Value: 67 Ohm
Lead Channel Pacing Threshold Amplitude: 1.25 V
Lead Channel Pacing Threshold Pulse Width: 0.64 ms
Lead Channel Sensing Intrinsic Amplitude: 22.4 mV
Lead Channel Setting Pacing Amplitude: 2.5 V
Lead Channel Setting Pacing Pulse Width: 0.64 ms
Lead Channel Setting Sensing Sensitivity: 5.6 mV

## 2018-05-17 HISTORY — PX: TRANSTHORACIC ECHOCARDIOGRAM: SHX275

## 2018-05-21 ENCOUNTER — Ambulatory Visit: Payer: BLUE CROSS/BLUE SHIELD

## 2018-05-22 ENCOUNTER — Ambulatory Visit: Payer: BLUE CROSS/BLUE SHIELD | Admitting: Cardiology

## 2018-05-22 ENCOUNTER — Encounter: Payer: Self-pay | Admitting: Cardiology

## 2018-05-22 VITALS — BP 140/82 | HR 87 | Ht 64.0 in | Wt 149.8 lb

## 2018-05-22 DIAGNOSIS — E785 Hyperlipidemia, unspecified: Secondary | ICD-10-CM

## 2018-05-22 DIAGNOSIS — I080 Rheumatic disorders of both mitral and aortic valves: Secondary | ICD-10-CM

## 2018-05-22 DIAGNOSIS — Z9889 Other specified postprocedural states: Secondary | ICD-10-CM | POA: Diagnosis not present

## 2018-05-22 DIAGNOSIS — E1169 Type 2 diabetes mellitus with other specified complication: Secondary | ICD-10-CM | POA: Diagnosis not present

## 2018-05-22 DIAGNOSIS — I10 Essential (primary) hypertension: Secondary | ICD-10-CM

## 2018-05-22 DIAGNOSIS — E1159 Type 2 diabetes mellitus with other circulatory complications: Secondary | ICD-10-CM | POA: Diagnosis not present

## 2018-05-22 DIAGNOSIS — I5189 Other ill-defined heart diseases: Secondary | ICD-10-CM

## 2018-05-22 DIAGNOSIS — I482 Chronic atrial fibrillation, unspecified: Secondary | ICD-10-CM | POA: Diagnosis not present

## 2018-05-22 DIAGNOSIS — I152 Hypertension secondary to endocrine disorders: Secondary | ICD-10-CM

## 2018-05-22 NOTE — Progress Notes (Signed)
PCP: Bufford Lope, DO  Clinic Note: Chief Complaint  Patient presents with  . Follow-up    No complaints  . Atrial Fibrillation  . Cardiac Valve Problem    Status post mitral valve repair    HPI: Krystal Little is a 62 y.o. 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 December 2000: Mitral valve repair with ring annuloplasty and transposition of chordae (Dr. Cyndia Bent)  ? Medtronic pacemaker placement by Dr. Rollene Fare --now followed by Dr. Curt Bears  Chronic persistent A. fib and flutter -attempted flutter ablation.  Also failed antiarrhythmics including sotalol Tikosyn              CHA2DS2-VASc Score =3, on warfarin  Krystal Little was last seen in November 2018 --> doing well without any major complaints.  May be noted some exertional dyspnea going up hills or stairs.  No chest pain or pressure.  No sensation of irregular heartbeat. Was just seen by Dr. Curt Bears on October 24 -was doing well.  No symptoms from A. fib.  Pacemaker functioning well.  Recent Hospitalizations: none  Studies Personally Reviewed - (if available, images/films reviewed: From Epic Chart or Care Everywhere)  None - Echo was ordered - not scheduled.  Interval History: Krystal Little returns today with no major cardiac complaints.  She only notes that she may get a little bit tired more easily than usual but no real shortness of breath unless she is tired.  She also indicates that she is probably a bit out of shape and has not been exercising like she used to.  Otherwise she denies any sensation of being in A. fib she no rapid irregular heartbeats palpitations.  No PND, orthopnea with only mild end of day edema.  No  exertional dyspnea or chest tightness/pressure.  No lightheadedness, dizziness or syncope/near syncope.  No TIA or amaurosis fugax.  No bleeding issues on warfarin: No melena, hematochezia, hematuria or epistaxis.  Remains on warfarin because of valvular A. fib.  ROS: A comprehensive was performed. Review of Systems  Constitutional: Negative for malaise/fatigue.  HENT: Negative for congestion.   Respiratory: Negative for cough, shortness of breath and wheezing.   Gastrointestinal: Negative for abdominal pain, constipation and heartburn.  Genitourinary: Negative for dysuria.  Musculoskeletal:        some mild arthritis pains  Neurological: Negative for dizziness and weakness.  All other systems reviewed and are negative.  I have reviewed and (if needed) personally updated the patient's problem list, medications, allergies, past medical and surgical history, social and family history.   Past Medical History:  Diagnosis Date  . Chronic atrial fibrillation 1990s   s/p DCCV then attempted ablation of complex A Flutter (@ [facility] - Dr. Deno Etienne), failed antiarrhythmics --> INR folllwed @ [facility]  . Diabetes mellitus type 2, uncontrolled, without complications (HCC)    On Oral Medications ([facility])  . Essential hypertension   . History of cardiac catheterization 2001   R&LHC - normal Coronaries, no evidence of Restictive Cardiomyopathy or Constrictive Pericarditis (also @ [facility])  . Hx of sick sinus syndrome 07/1999   Wtih symptomatic bradycardia - syncope (Tachy-Brady)  . S/P MVR (mitral valve repair) 08/09/1999   H/o Rheumativ MV disease with  Proloapse & Mod-Severe MR --> Ant&Post Leaflet resection/repair wiht Ring Annuloplasty;; Echo 9/'14: MV ring prosthesis well seated, mild restriction of Post MV leaflet, Mlid MR w/o MS, EF 50-55% - Gr1 DD, severe LA dilation, Mod-severe RA dilation, trivial AI & Mod TR (PAP ~35 mmHg)  . 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 CARDIAC CATHETERIZATION  08/03/1999   [place] - normal Coronaries; no sign of constriction or restrictive cardiomypathy  . MITRAL VALVE REPAIR  07/1999   Both Ant& Post leaflet repair - quadrangular resection&caudal transposition, #32 Sequin Annuloplasty ring  . PACEMAKER GENERATOR CHANGE  11/26/2008   Medtronic Adapta L(? if it has been checked since 02/2013)  . PACEMAKER INSERTION  07/1999   for SSS in setting of Chronic Afib; Medtronic Kappa [id number], [id number] H.  . TRANSTHORACIC ECHOCARDIOGRAM  03/21/2013    Current Meds  Medication Sig  . atenolol (TENORMIN) 50 MG tablet Take 1 tablet (50 mg total) daily by mouth.  . CARTIA XT 180 MG 24 hr capsule TAKE 1 CAPSULE(180 MG) BY MOUTH DAILY  . lisinopril (PRINIVIL,ZESTRIL) 5 MG tablet TAKE 1 TABLET(5 MG) BY MOUTH DAILY  . metFORMIN (GLUCOPHAGE-XR) 500 MG 24 hr tablet Take 500 mg by mouth 2 (two) times daily.   Marland Kitchen warfarin (COUMADIN) 5 MG tablet TAKE 1 TABLET BY MOUTH DAILY EXCEPT TAKE 1 AND 1/2 TABLET ON MONDAY AND FRIDAY    No Known Allergies  Social History   Tobacco Use  . Smoking status: Never Smoker  . Smokeless tobacco: Never Used  Substance Use Topics  . Alcohol use: Yes    Alcohol/week: 0.0 standard drinks    Comment: occasional, once every month or two- glass of wine   . Drug use: No   Social History   Social History Narrative   Lives with husband, great-niece (43)  and great-nephew (80).    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   1 son died in an automobile accident @ age 37 (2010).    She raised several nieces & nephews - now lives with her 100 y/o daughter (who recently moved back in)   Routinely walks 2-3 days/week.   Started new job through the [employer].    family history includes Cancer in her brother; Diabetes in her sister; Hypertension in her mother; Lung cancer in her brother.  Wt Readings from Last 3 Encounters:  05/22/18 149 lb 12.8  oz (67.9 kg)  05/14/18 150 lb (68 kg)  05/09/18 150 lb (68 kg)    PHYSICAL EXAM BP 140/82   Pulse 87   Ht 5\' 4"  (1.626 m)   Wt 149 lb 12.8 oz (67.9 kg)   BMI 25.71 kg/m  Physical Exam  Constitutional: She is oriented to person, place, and time. She appears well-developed and well-nourished. No distress.  Healthy-appearing.  Well-groomed.  HENT:  Head: Normocephalic and atraumatic.  Neck: No hepatojugular reflux and no JVD present. Carotid bruit is not present.  Cardiovascular: Normal rate, S1 normal and intact distal pulses. An irregularly irregular rhythm present. PMI is not displaced. Exam reveals distant heart sounds. Exam reveals no gallop.  Murmur heard.  Medium-pitched harsh systolic murmur is present at the upper right sternal border and lower left sternal border. Split S2  Pulmonary/Chest: Effort normal and breath sounds normal. No respiratory distress. She has no wheezes. She has no rales.  Abdominal: Soft. Bowel sounds are normal. She exhibits no  distension. There is no tenderness. There is no rebound.  Musculoskeletal: Normal range of motion. She exhibits no edema.  Neurological: She is alert and oriented to person, place, and time.  Psychiatric: She has a normal mood and affect. Her behavior is normal. Judgment and thought content normal.  Vitals reviewed.    Adult ECG Report Not checked  Other studies Reviewed: Additional studies/ records that were reviewed today include:  Recent Labs:   Lab Results  Component Value Date   CHOL 176 05/18/2016   HDL 52 05/18/2016   LDLCALC 107 05/18/2016   LDLDIRECT 107 (H) 10/02/2012   TRIG 87 05/18/2016   CHOLHDL 3.4 05/18/2016   Lab Results  Component Value Date   HGBA1C 8.0 (A) 02/04/2018    ASSESSMENT / PLAN: Overall clinically stable.  No medication changes needed.   Planning to check 2D echocardiogram to reassess mitral valve repair.  Problem List Items Addressed This Visit    Chronic atrial fibrillation;  CHA2DS2-VASc Score 3 - on Warfarin (Chronic)    Mostly paced rhythm on previous EKG.  As far as I can tell she is maintaining underlying sinus rhythm.  Is on atenolol and Cartia for rate control.  Warfarin for anticoagulation.      Relevant Orders   ECHOCARDIOGRAM COMPLETE   Diastolic dysfunction without heart failure (Chronic)    Significant grade 3 diastolic dysfunction noted on last echocardiogram but in the absence of any heart failure symptoms, would not require a diuretic.  Is on beta-blocker and ACE inhibitor for afterload reduction.      Relevant Orders   ECHOCARDIOGRAM COMPLETE   Hyperlipidemia associated with type 2 diabetes mellitus (HCC) (Chronic)    Borderline LDL levels with dietary control.  Refuses antilipid agents.  May benefit from Zetia which should not have any side effects.      Relevant Orders   Lipid panel   Hepatic function panel   Hypertension associated with diabetes ([place]) (Chronic)    Blood pressure is usually much better controlled than it is today.  She sort of rushed in today and had not taken her blood pressure medications right before she left.  She tells me at home her pressures are usually in the 458 systolic range. Would continue to monitor if it remains elevated, would probably titrate up lisinopril dose to 10 mg.      MITRAL REGURGITATION - status post MVR (Chronic)    Last echo was in 2017.  Will check 2D echocardiogram to ensure stable valve.  Continue as needed prophylactic antibiotics for SBE.  Plan: Check 2D echocardiogram to reassess valve.      S/P MVR (mitral valve repair) - Primary (Chronic)    Recheck 2D echo      Relevant Orders   ECHOCARDIOGRAM COMPLETE     I spent a total of 25 minutes with the patient and chart review. >  50% of the time was spent in direct patient consultation.   Current medicines are reviewed at length with the patient today.  (+/- concerns) none The following changes have been made:  None  Patient  Instructions  .Medication Instructions:  Not  needed If you need a refill on your cardiac medications before your next appointment, please call your pharmacy.   Lab work: LIPIDS AND LIVER PROFILE- CAN DO AT PRIMARY OFFICE- FASTING  If you have labs (blood work) drawn today and your tests are completely normal, you will receive your results only by: Marland Kitchen MyChart Message (if you have MyChart)  OR . A paper copy in the mail If you have any lab test that is abnormal or we need to change your treatment, we will call you to review the results.  Testing/Procedures: SCHEDULE AT [place] has requested that you have an echocardiogram. Echocardiography is a painless test that uses sound waves to create images of your heart. It provides your doctor with information about the size and shape of your heart and how well your heart's chambers and valves are working. This procedure takes approximately one hour. There are no restrictions for this procedure.    Follow-Up: At [facility], you and your health needs are our priority.  As part of our continuing mission to provide you with exceptional heart care, we have created designated Provider Care Teams.  These Care Teams include your primary Cardiologist (physician) and Advanced Practice Providers (APPs -  Physician Assistants and Nurse Practitioners) who all work together to provide you with the care you need, when you need it. You will need a follow up appointment in 13 months- DEC 2020.  Please call our office 2 months in advance to schedule this appointment.  You may see Glenetta Hew, MD or one of the following Advanced Practice Providers on your designated Care Team:   Rosaria Ferries, PA-C . Jory Sims, DNP, ANP  Any Other Special Instructions Will Be Listed Below (If Applicable).     Studies Ordered:   Orders Placed This Encounter  Procedures  . Lipid panel  . Hepatic function panel  .  ECHOCARDIOGRAM COMPLETE      Glenetta Hew, M.D., M.S. Interventional Cardiologist   Pager # [phone] Phone # [phone] [address] [place], [place] [address]   Thank you for choosing Heartcare at [facility]!!

## 2018-05-22 NOTE — Patient Instructions (Signed)
.  Medication Instructions:  Not  needed If you need a refill on your cardiac medications before your next appointment, please call your pharmacy.   Lab work: LIPIDS AND LIVER PROFILE- CAN DO AT PRIMARY OFFICE- FASTING  If you have labs (blood work) drawn today and your tests are completely normal, you will receive your results only by: Marland Kitchen MyChart Message (if you have MyChart) OR . A paper copy in the mail If you have any lab test that is abnormal or we need to change your treatment, we will call you to review the results.  Testing/Procedures: SCHEDULE AT [place] has requested that you have an echocardiogram. Echocardiography is a painless test that uses sound waves to create images of your heart. It provides your doctor with information about the size and shape of your heart and how well your heart's chambers and valves are working. This procedure takes approximately one hour. There are no restrictions for this procedure.    Follow-Up: At [facility], you and your health needs are our priority.  As part of our continuing mission to provide you with exceptional heart care, we have created designated Provider Care Teams.  These Care Teams include your primary Cardiologist (physician) and Advanced Practice Providers (APPs -  Physician Assistants and Nurse Practitioners) who all work together to provide you with the care you need, when you need it. You will need a follow up appointment in 13 months- DEC 2020.  Please call our office 2 months in advance to schedule this appointment.  You may see Glenetta Hew, MD or one of the following Advanced Practice Providers on your designated Care Team:   Rosaria Ferries, PA-C . Jory Sims, DNP, ANP  Any Other Special Instructions Will Be Listed Below (If Applicable).

## 2018-05-24 ENCOUNTER — Encounter: Payer: Self-pay | Admitting: Cardiology

## 2018-05-24 NOTE — Assessment & Plan Note (Signed)
Significant grade 3 diastolic dysfunction noted on last echocardiogram but in the absence of any heart failure symptoms, would not require a diuretic.  Is on beta-blocker and ACE inhibitor for afterload reduction.

## 2018-05-24 NOTE — Assessment & Plan Note (Signed)
Borderline LDL levels with dietary control.  Refuses antilipid agents.  May benefit from Zetia which should not have any side effects.

## 2018-05-24 NOTE — Assessment & Plan Note (Signed)
Recheck 2D echo

## 2018-05-24 NOTE — Assessment & Plan Note (Signed)
Mostly paced rhythm on previous EKG.  As far as I can tell she is maintaining underlying sinus rhythm.  Is on atenolol and Cartia for rate control.  Warfarin for anticoagulation.

## 2018-05-24 NOTE — Assessment & Plan Note (Signed)
Blood pressure is usually much better controlled than it is today.  She sort of rushed in today and had not taken her blood pressure medications right before she left.  She tells me at home her pressures are usually in the 856 systolic range. Would continue to monitor if it remains elevated, would probably titrate up lisinopril dose to 10 mg.

## 2018-05-24 NOTE — Assessment & Plan Note (Signed)
Last echo was in 2017.  Will check 2D echocardiogram to ensure stable valve.  Continue as needed prophylactic antibiotics for SBE.  Plan: Check 2D echocardiogram to reassess valve.

## 2018-05-27 ENCOUNTER — Ambulatory Visit (HOSPITAL_COMMUNITY): Payer: BLUE CROSS/BLUE SHIELD | Attending: Cardiovascular Disease

## 2018-05-27 ENCOUNTER — Other Ambulatory Visit: Payer: Self-pay

## 2018-05-27 DIAGNOSIS — I5189 Other ill-defined heart diseases: Secondary | ICD-10-CM | POA: Diagnosis present

## 2018-05-27 DIAGNOSIS — I482 Chronic atrial fibrillation, unspecified: Secondary | ICD-10-CM | POA: Diagnosis not present

## 2018-05-27 DIAGNOSIS — Z9889 Other specified postprocedural states: Secondary | ICD-10-CM | POA: Diagnosis not present

## 2018-05-28 ENCOUNTER — Telehealth: Payer: Self-pay | Admitting: *Deleted

## 2018-05-28 MED ORDER — FLUCONAZOLE 150 MG PO TABS: 150.0000 mg | ORAL_TABLET | Freq: Once | ORAL | 0 refills | Status: AC

## 2018-05-28 NOTE — Telephone Encounter (Signed)
Left on voicemail Rx sent 

## 2018-05-28 NOTE — Telephone Encounter (Signed)
Patient called c/o vaginal itching was prescribed diflucan tablet on 05/14/18 for itch, patient said it has returned. She asked if another Rx could be given for more than 1 pill? Please advise

## 2018-05-28 NOTE — Telephone Encounter (Signed)
Okay for Diflucan 150 mg x 1 dose

## 2018-06-04 ENCOUNTER — Other Ambulatory Visit: Payer: Self-pay | Admitting: Cardiology

## 2018-06-05 NOTE — Telephone Encounter (Signed)
Rx has been sent to the pharmacy electronically. ° °

## 2018-06-28 ENCOUNTER — Other Ambulatory Visit: Payer: Self-pay | Admitting: Cardiology

## 2018-06-28 NOTE — Telephone Encounter (Signed)
Rx request sent to pharmacy.  

## 2018-07-02 ENCOUNTER — Ambulatory Visit (INDEPENDENT_AMBULATORY_CARE_PROVIDER_SITE_OTHER): Payer: BLUE CROSS/BLUE SHIELD | Admitting: *Deleted

## 2018-07-02 DIAGNOSIS — Z7901 Long term (current) use of anticoagulants: Secondary | ICD-10-CM | POA: Diagnosis not present

## 2018-07-02 DIAGNOSIS — I482 Chronic atrial fibrillation, unspecified: Secondary | ICD-10-CM

## 2018-07-02 LAB — POCT INR: INR: 1.7 — AB (ref 2.0–3.0)

## 2018-07-15 ENCOUNTER — Encounter: Payer: Self-pay | Admitting: Family Medicine

## 2018-07-15 ENCOUNTER — Ambulatory Visit (INDEPENDENT_AMBULATORY_CARE_PROVIDER_SITE_OTHER): Payer: BLUE CROSS/BLUE SHIELD | Admitting: Family Medicine

## 2018-07-15 ENCOUNTER — Other Ambulatory Visit: Payer: Self-pay

## 2018-07-15 ENCOUNTER — Ambulatory Visit (INDEPENDENT_AMBULATORY_CARE_PROVIDER_SITE_OTHER): Payer: BLUE CROSS/BLUE SHIELD

## 2018-07-15 VITALS — BP 122/42 | HR 85 | Temp 98.6°F | Wt 143.4 lb

## 2018-07-15 DIAGNOSIS — L02224 Furuncle of groin: Secondary | ICD-10-CM | POA: Diagnosis not present

## 2018-07-15 DIAGNOSIS — Z7901 Long term (current) use of anticoagulants: Secondary | ICD-10-CM

## 2018-07-15 DIAGNOSIS — N898 Other specified noninflammatory disorders of vagina: Secondary | ICD-10-CM

## 2018-07-15 DIAGNOSIS — I482 Chronic atrial fibrillation, unspecified: Secondary | ICD-10-CM

## 2018-07-15 MED ORDER — CEPHALEXIN 500 MG PO CAPS: 500.0000 mg | ORAL_CAPSULE | Freq: Two times a day (BID) | ORAL | 0 refills | Status: AC

## 2018-07-15 MED ORDER — NYSTATIN 100000 UNIT/GM EX CREA: 1.0000 "application " | TOPICAL_CREAM | Freq: Four times a day (QID) | CUTANEOUS | 1 refills | Status: AC

## 2018-07-15 NOTE — Patient Instructions (Addendum)
  Please take the antibiotic for 7 days. Use warm compresses on the area to help it calm down as well. If you feel it is getting painful again or bigger in size please come back so we can drain it in the office, but today I do not feel there is a lot of fluid that would come out.  We'll try the topical yeast cream on the itchy areas and again come back if you do not feel improvement, we may have to switch to a topical estrogen cream.   If you have questions or concerns please do not hesitate to call at [phone].  Lucila Maine, DO PGY-3, [facility] 07/15/2018 4:31 PM

## 2018-07-15 NOTE — Progress Notes (Signed)
    Subjective:    Patient ID: Krystal Little, female    DOB: [birth year], 62 y.o.   MRN: [id number]   CC: boil on thigh  HPI: patient reports she had a painful boil on her inner right groin for the past several days. It was initially tender and soft. She did poke it with a needle and squeeze it and expressed material. She has been putting bacitracin on it. It is now not tender and feels firm. It has not completely gone away so she wanted to get it looked at.   She also reports recurrent yeast infections which is frustrating to her. She reports she has been given diflucan several times without resolution of symptoms. From chart review she had 1 dose diflucan 10/29 and a second dose 11/12.   Smoking status reviewed- non-smoker  Review of Systems- no fevers or chills, no vaginal bleeding, no pelvic pain   Objective:  BP (!) 122/42   Pulse 85   Temp 98.6 F (37 C) (Oral)   Wt 143 lb 6 oz (65 kg)   SpO2 96%   BMI 24.61 kg/m  Vitals and nursing note reviewed  General: well nourished, in no acute distress HEENT: normocephalic, MMM Cardiac: regular rate Respiratory: no increased work of breathing Abdomen: soft, nontender, nondistended, no masses or organomegaly. Bowel sounds present GU: external genitalia with thin, friable white tissue that appears excoriated in areas. Cervix is prolapsed. No vaginal discharge noted. No CMT. Right sided groin there is a 2cm by 1cm raised area that is non-tender, firm and indurated, it has an area of central opening and appears to have drained recently. No surrounding erythema. Neuro: alert and oriented, no focal deficits   Assessment & Plan:   1. Vaginal discharge Patient complaining of itching that she attributes to yeast infection but wet prep negative. Skin appears thin, friable, irritated. Likely yeast dermatitis, but could be from estrogen deficiency. Will try topical nystatin and if no improvement return and consider topical estrogen cream for  symptomatic relief.  - POCT Wet Prep [employer])  2. Boil, groin There is a focal 2 cm by 1 cm furuncle in right groin that feels firm and indurated, not fluctuant. Patient has drained this at home. Do not feel there would be an appreciable amount of material to drain in the office. Will try course of antibiotics and encourage patient to use warm compresses. Asked her to return if the area gets larger or painful for in office drainage. Would prefer to use doxy or bactrim but as patient is on warfarin and these have interactions will do 7 day course of keflex. Strict return precautions reviewed.    Return in about 2 weeks (around 07/29/2018), or if symptoms worsen or fail to improve.   Lucila Maine, DO Family Medicine Resident PGY-3

## 2018-07-16 LAB — HEPATIC FUNCTION PANEL
ALT: 15 IU/L (ref 0–32)
AST: 18 IU/L (ref 0–40)
Albumin: 4.1 g/dL (ref 3.6–4.8)
Alkaline Phosphatase: 143 IU/L — ABNORMAL HIGH (ref 39–117)
Bilirubin Total: 1 mg/dL (ref 0.0–1.2)
Bilirubin, Direct: 0.3 mg/dL (ref 0.00–0.40)
Total Protein: 7.4 g/dL (ref 6.0–8.5)

## 2018-07-16 LAB — LIPID PANEL
CHOLESTEROL TOTAL: 212 mg/dL — AB (ref 100–199)
Chol/HDL Ratio: 4.2 ratio (ref 0.0–4.4)
HDL: 50 mg/dL (ref >39)
LDL CALC: 138 mg/dL — AB (ref 0–99)
Triglycerides: 121 mg/dL (ref 0–149)
VLDL CHOLESTEROL CAL: 24 mg/dL (ref 5–40)

## 2018-07-16 LAB — HM DIABETES EYE EXAM

## 2018-07-18 LAB — POCT WET PREP (WET MOUNT): Trichomonas Wet Prep HPF POC: ABSENT

## 2018-07-23 ENCOUNTER — Telehealth: Payer: Self-pay | Admitting: *Deleted

## 2018-07-23 DIAGNOSIS — E1169 Type 2 diabetes mellitus with other specified complication: Secondary | ICD-10-CM

## 2018-07-23 DIAGNOSIS — E785 Hyperlipidemia, unspecified: Principal | ICD-10-CM

## 2018-07-23 DIAGNOSIS — Z79899 Other long term (current) drug therapy: Secondary | ICD-10-CM

## 2018-07-23 MED ORDER — EZETIMIBE 10 MG PO TABS: 10.0000 mg | ORAL_TABLET | Freq: Every day | ORAL | 3 refills | Status: DC

## 2018-07-23 NOTE — Telephone Encounter (Signed)
LET MESSAGE TO CALL BACK - IN REGARDS TO LAB RESULTS-- LIPID

## 2018-07-23 NOTE — Telephone Encounter (Signed)
-----  Message from Leonie Man, MD sent at 07/18/2018  2:58 PM EST ----- Cholesterol levels - LDL ("bad" cholesterol) is 138!  Goal should be less tan 100.  Total Chol is 212.  HDL(good cholesterol) is at goal. Liver function tests (LFTs) - relatively normal.  Alk Phos is a bit up - usually correlates more with bile flow & not liver function. -- ? Related to gall stones?  Has been a bit up off & on over the years -- non-specific at this low level increase.  As discussed in clinic - lets try Zetia 10 mg daily - recheck Lipids/LFTs  in ~3-4 months.  Glenetta Hew, MD  Glenetta Hew c

## 2018-07-23 NOTE — Telephone Encounter (Signed)
The patient has been notified of the result and verbalized understanding.  All questions (if any) were answered. Raiford Simmonds, RN 07/23/2018 4:10 PM   Patient states her new  Insurance for 2020 - requiring her to change  All provider  - so she can be in network.  she states she has change to [facility]  patient is aware of the changes in medication and lab required .  Will  E-sent medication and labs will be mailed in 3to 4 months  patient will inform RN [place]

## 2018-07-25 ENCOUNTER — Encounter: Payer: Self-pay | Admitting: Family Medicine

## 2018-08-05 ENCOUNTER — Other Ambulatory Visit: Payer: Self-pay | Admitting: Family Medicine

## 2018-08-05 DIAGNOSIS — Z7901 Long term (current) use of anticoagulants: Secondary | ICD-10-CM

## 2018-08-19 ENCOUNTER — Telehealth: Payer: Self-pay | Admitting: Cardiology

## 2018-08-19 MED ORDER — AMOXICILLIN 500 MG PO CAPS: 2000.0000 mg | ORAL_CAPSULE | Freq: Once | ORAL | 0 refills | Status: AC

## 2018-08-19 NOTE — Telephone Encounter (Signed)
Left detailed message on pt home phone number on file per DPR regarding amoxicilin 2000mg  4 capsules to be taken together one hour prior to dental procedure. Also informed that medication available at pharmacy on file: [facility] [phone]

## 2018-08-19 NOTE — Telephone Encounter (Signed)
Left detailed message on pt home phone number on file per DPR regarding amoxicilin 2000mg  4 capsules to be taken together one hour prior to dental procedure. Also informed that medication available at pharmacy on file: [facility] [phone]

## 2018-08-19 NOTE — Telephone Encounter (Signed)
Patient is called wanting to speak to the nurse because she is going to a new dentist tomorrow and will need a script for antibiotics sent to the [place], [place]

## 2018-08-19 NOTE — Telephone Encounter (Signed)
  1. What dental office are you calling from? Patient is calling  2. What is your office phone number? na  3. What is your fax number? na  4. What type of procedure is the patient having performed? Routine cleaning  5. What date is procedure scheduled or is the patient there now? 08/20/18 (if the patient is at the dentist's office question goes to their cardiologist if he/she is in the office.  If not, question should go to the DOD).   6. What is your question (ex. Antibiotics prior to procedure, holding medication-we need to know how long dentist wants pt to hold med)?   Patient is calling because she would like Dr Ellyn Hack to call her in an antibiotic today before her dental cleaning tomorrow. She states that he usually does this for her.

## 2018-09-04 ENCOUNTER — Other Ambulatory Visit: Payer: Self-pay | Admitting: Cardiology

## 2018-10-29 ENCOUNTER — Telehealth: Payer: Self-pay | Admitting: *Deleted

## 2018-10-29 DIAGNOSIS — Z79899 Other long term (current) drug therapy: Secondary | ICD-10-CM

## 2018-10-29 DIAGNOSIS — E1169 Type 2 diabetes mellitus with other specified complication: Secondary | ICD-10-CM

## 2018-10-29 DIAGNOSIS — E785 Hyperlipidemia, unspecified: Principal | ICD-10-CM

## 2018-10-29 NOTE — Telephone Encounter (Signed)
Labslip mailed and letter

## 2018-10-29 NOTE — Telephone Encounter (Signed)
-----   Message from Raiford Simmonds, RN sent at 07/23/2018  4:16 PM EST ----- Regarding: NEED LABS - MAY 7.2020 NEED TO MAIL  LETTER AND LABS @October 22 2018 LIPID AND HEPATIC  LABS DUE MAY 7,2020

## 2018-12-03 ENCOUNTER — Telehealth: Payer: Self-pay | Admitting: Cardiology

## 2018-12-03 NOTE — Telephone Encounter (Signed)
Left message to call back  

## 2018-12-03 NOTE — Telephone Encounter (Signed)
New message   Pt c/o medication issue:  1. Name of Medication: diltiazem cd 180 mg  2. How are you currently taking this medication (dosage and times per day)? 1 time daily  3. Are you having a reaction (difficulty breathing--STAT)? No   4. What is your medication issue? Patient states that she needs new prescription for this medication sent to [facility] on [address], [address], [place].   Patient has had to switch to a new insurance company  and wants to know if the doctor can recommend a provider with [facility].  Please advise.

## 2018-12-04 MED ORDER — DILTIAZEM HCL ER COATED BEADS 180 MG PO CP24: ORAL_CAPSULE | ORAL | 2 refills | Status: DC

## 2018-12-04 NOTE — Telephone Encounter (Signed)
Patient returned call. Patient takes lunch break from 2 to 230pm that would be the best time to call her back.

## 2018-12-04 NOTE — Telephone Encounter (Signed)
Spoke to patient , dr harding does not know a cardiologist in [place] or [facility]. Patient staes she  Has new primary and they did her labs - lipids - they wanted her stop cholesterol due t results.  informed patient to continue current medication until she sees new cardiologist    diltiazem reorder until she sees  New cardiologist. Patient verbalized understanding/

## 2018-12-28 ENCOUNTER — Other Ambulatory Visit: Payer: Self-pay | Admitting: Cardiology

## 2019-04-15 ENCOUNTER — Encounter: Payer: Self-pay | Admitting: Gynecology

## 2019-06-16 ENCOUNTER — Other Ambulatory Visit: Payer: Self-pay | Admitting: Cardiology

## 2019-07-16 ENCOUNTER — Ambulatory Visit (HOSPITAL_COMMUNITY)
Admission: EM | Admit: 2019-07-16 | Discharge: 2019-07-16 | Disposition: A | Discharge status: Home / Self Care | Payer: BLUE CROSS/BLUE SHIELD | Attending: Family Medicine | Admitting: Family Medicine

## 2019-07-16 ENCOUNTER — Encounter (HOSPITAL_COMMUNITY): Payer: Self-pay

## 2019-07-16 ENCOUNTER — Other Ambulatory Visit: Payer: Self-pay

## 2019-07-16 ENCOUNTER — Other Ambulatory Visit: Payer: Self-pay | Admitting: Cardiology

## 2019-07-16 DIAGNOSIS — L0291 Cutaneous abscess, unspecified: Secondary | ICD-10-CM

## 2019-07-16 MED ORDER — DOXYCYCLINE HYCLATE 100 MG PO CAPS: 100.0000 mg | ORAL_CAPSULE | Freq: Two times a day (BID) | ORAL | 0 refills | Status: DC

## 2019-07-16 NOTE — Discharge Instructions (Addendum)
Take the antibiotic as prescribed  Keep the area clean and dry.  Follow up as needed for continued or worsening symptoms

## 2019-07-16 NOTE — Telephone Encounter (Signed)
Rx(s) sent to pharmacy electronically.  

## 2019-07-16 NOTE — ED Triage Notes (Signed)
Patient presents to Urgent Care with complaints of abscess/ insect bite on her right lower leg since bout a month ago. Patient reports there are now two wounds next to each other, both open and bleeding with some yellow exudate. Pt is a diabetic.

## 2019-07-17 NOTE — ED Provider Notes (Signed)
[facility]    CSN: [id number] Arrival date & time: 07/16/19  1342      History   Chief Complaint Chief Complaint  Patient presents with  . Abscess    HPI Krystal Little is a 63 y.o. female.   Patient is a 63 year old female past medical history of chronic A. fib, diabetes, hypertension, cardiac catheterization, sinus extension, mitral valve repair, cardiac pacemaker.  Today with abscess/ulcer to right lower extremity.  This is been present for about a month.  Reporting she has been using antibiotic cream which she feels like it was helping but then another ulcer popped up and has been painful, swollen and draining.  She is concerned because of her diabetes.  Denies any fever, chills, body aches, nausea.  ROS per HPI      Past Medical History:  Diagnosis Date  . Chronic atrial fibrillation ([place]) 1990s   s/p DCCV then attempted ablation of complex A [place] (@ [facility] - Dr. Deno Etienne), failed antiarrhythmics --> INR folllwed @ [facility]  . Diabetes mellitus type 2, uncontrolled, without complications    On Oral Medications ([place] [facility])  . Essential hypertension   . History of cardiac catheterization 2001   R&LHC - normal Coronaries, no evidence of Restictive Cardiomyopathy or Constrictive Pericarditis (also @ [facility])  . Hx of sick sinus syndrome 07/1999   Wtih symptomatic bradycardia - syncope ([place])  . 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 Hyperlipidemia associated with type 2 diabetes mellitus ([place]) 09/25/2016  . Diastolic dysfunction  without heart failure 06/02/2015  . Health care maintenance 03/24/2013  . Hypertension associated with diabetes ([place]) 10/16/2011  . Long term current use of anticoagulant therapy 08/27/2010  . DM (diabetes mellitus), type 2, uncontrolled ([place]) 02/11/2008  . MITRAL REGURGITATION - status post MVR 09/14/1999  . S/P MVR (mitral valve repair) 08/09/1999  . Chronic atrial fibrillation; CHA2DS2-VASc Score 3 - on Warfarin 08/03/1999  . S/P placement of cardiac pacemaker 07/18/1999    Past Surgical History:  Procedure Laterality Date  . CARDIAC CATHETERIZATION  08/03/1999   [place] - normal Coronaries; no sign of constriction or restrictive cardiomypathy  . MITRAL VALVE REPAIR  07/1999   Both Ant& Post leaflet repair - quadrangular resection&caudal transposition, #32 Sequin Annuloplasty ring  . PACEMAKER GENERATOR CHANGE  11/26/2008   Medtronic Adapta L(? if it has been checked since 02/2013)  . PACEMAKER INSERTION  07/1999   for SSS in setting of Chronic Afib; Medtronic Kappa [id number], [id number].  . TRANSTHORACIC ECHOCARDIOGRAM  03/21/2013    OB History    Gravida  2   Para  2   Term  2   Preterm  0   AB  0   Living  1     SAB  0   TAB  0   Ectopic  0   Multiple  0   Live Births               Home Medications    Prior to Admission medications   Medication Sig Start  Date End Date Taking? Authorizing Provider  atenolol (TENORMIN) 50 MG tablet TAKE 1 TABLET(50 MG) BY MOUTH DAILY 06/17/19   Leonie Man, MD  diltiazem (CARTIA XT) 180 MG 24 hr capsule TAKE 1 CAPSULE(180 MG) BY MOUTH DAILY 12/04/18   Leonie Man, MD  doxycycline (VIBRAMYCIN) 100 MG capsule Take 1 capsule (100 mg total) by mouth 2 (two) times daily. 07/16/19   Loura Halt A, NP  ezetimibe (ZETIA) 10 MG tablet Take 1 tablet (10 mg total) by mouth daily. 07/23/18 10/21/18  Leonie Man, MD  lisinopril (ZESTRIL) 5 MG tablet Take 1 tablet (5 mg total) by mouth daily. NEEDS APPOINTMENT FOR FUTURE REFILLS  07/16/19   Leonie Man, MD  metFORMIN (GLUCOPHAGE-XR) 500 MG 24 hr tablet Take 500 mg by mouth 2 (two) times daily.  01/25/18   [provider]  warfarin (COUMADIN) 5 MG tablet TAKE 1 TABLET BY MOUTH DAILY EXCEPT TAKE 1 AND 1/2 TABLET ON MONDAY AND FRIDAY 08/05/18   Bufford Lope, DO    Family History Family History  Problem Relation Age of Onset  . Hypertension Mother   . Diabetes Sister   . Cancer Brother        Rectal  . Lung cancer Brother     Social History Social History   Tobacco Use  . Smoking status: Never Smoker  . Smokeless tobacco: Never Used  Substance Use Topics  . Alcohol use: Yes    Alcohol/week: 0.0 standard drinks    Comment: occasional, once every month or two- glass of wine   . Drug use: No     Allergies   Patient has no known allergies.   Review of Systems Review of Systems   Physical Exam Triage Vital Signs ED Triage Vitals  Enc Vitals Group     BP 07/16/19 1510 129/68     Pulse Rate 07/16/19 1510 70     Resp 07/16/19 1510 16     Temp 07/16/19 1510 97.9 F (36.6 C)     Temp Source 07/16/19 1510 Oral     SpO2 07/16/19 1510 100 %     Weight --      Height --      Head Circumference --      Peak Flow --      Pain Score 07/16/19 1508 0     Pain Loc --      Pain Edu? --      Excl. in [place]? --    No data found.  Updated Vital Signs BP 129/68 (BP Location: Left Arm)   Pulse 70   Temp 97.9 F (36.6 C) (Oral)   Resp 16   SpO2 100%   Visual Acuity Right Eye Distance:   Left Eye Distance:   Bilateral Distance:    Right Eye Near:   Left Eye Near:    Bilateral Near:     Physical Exam Vitals and nursing note reviewed.  Constitutional:      General: She is not in acute distress.    Appearance: Normal appearance. She is not ill-appearing, toxic-appearing or diaphoretic.  HENT:     Head: Normocephalic.     Nose: Nose normal.     Mouth/Throat:     Pharynx: Oropharynx is clear.  Eyes:     Conjunctiva/sclera:  Conjunctivae normal.  Pulmonary:     Effort: Pulmonary effort is normal.  Musculoskeletal:        General: Normal range of motion.  Cervical back: Normal range of motion.  Skin:    General: Skin is warm and dry.     Findings: No rash.     Comments: 2 ulcers to RLE. One healing and the other, that is smaller is draining with generalized erythema and induration.  Tender to touch.  Neurological:     Mental Status: She is alert.  Psychiatric:        Mood and Affect: Mood normal.      UC Treatments / Results  Labs (all labs ordered are listed, but only abnormal results are displayed) Labs Reviewed - No data to display  EKG   Radiology No results found.  Procedures Procedures (including critical care time)  Medications Ordered in UC Medications - No data to display  Initial Impression / Assessment and Plan / UC Course  I have reviewed the triage vital signs and the nursing notes.  Pertinent labs & imaging results that were available during my care of the patient were reviewed by me and considered in my medical decision making (see chart for details).     Abscess to right lower extremity.  Already draining.  Will treat with doxycycline.  Warm compress Recommended keep area clean and dry and follow-up as needed for continued or worsening problems. Final Clinical Impressions(s) / UC Diagnoses   Final diagnoses:  Abscess     Discharge Instructions     Take the antibiotic as prescribed  Keep the area clean and dry.  Follow up as needed for continued or worsening symptoms     ED Prescriptions    Medication Sig Dispense Auth. Provider   doxycycline (VIBRAMYCIN) 100 MG capsule Take 1 capsule (100 mg total) by mouth 2 (two) times daily. 20 capsule Loura Halt A, NP     PDMP not reviewed this encounter.   Orvan July, NP 07/17/19 [id number]

## 2019-08-18 ENCOUNTER — Ambulatory Visit (INDEPENDENT_AMBULATORY_CARE_PROVIDER_SITE_OTHER): Payer: Medicare Other | Admitting: Family Medicine

## 2019-08-18 ENCOUNTER — Telehealth: Payer: Self-pay | Admitting: Family Medicine

## 2019-08-18 ENCOUNTER — Other Ambulatory Visit: Payer: Self-pay

## 2019-08-18 ENCOUNTER — Encounter: Payer: Self-pay | Admitting: Family Medicine

## 2019-08-18 VITALS — BP 134/72 | HR 83 | Wt 141.4 lb

## 2019-08-18 DIAGNOSIS — E1165 Type 2 diabetes mellitus with hyperglycemia: Secondary | ICD-10-CM | POA: Diagnosis not present

## 2019-08-18 DIAGNOSIS — X58XXXA Exposure to other specified factors, initial encounter: Secondary | ICD-10-CM

## 2019-08-18 DIAGNOSIS — Z1159 Encounter for screening for other viral diseases: Secondary | ICD-10-CM | POA: Diagnosis not present

## 2019-08-18 DIAGNOSIS — S81801A Unspecified open wound, right lower leg, initial encounter: Secondary | ICD-10-CM | POA: Diagnosis not present

## 2019-08-18 DIAGNOSIS — I482 Chronic atrial fibrillation, unspecified: Secondary | ICD-10-CM | POA: Diagnosis not present

## 2019-08-18 DIAGNOSIS — E118 Type 2 diabetes mellitus with unspecified complications: Secondary | ICD-10-CM | POA: Diagnosis not present

## 2019-08-18 DIAGNOSIS — Z7901 Long term (current) use of anticoagulants: Secondary | ICD-10-CM

## 2019-08-18 DIAGNOSIS — L03115 Cellulitis of right lower limb: Secondary | ICD-10-CM | POA: Diagnosis not present

## 2019-08-18 DIAGNOSIS — Z7984 Long term (current) use of oral hypoglycemic drugs: Secondary | ICD-10-CM

## 2019-08-18 DIAGNOSIS — L02415 Cutaneous abscess of right lower limb: Secondary | ICD-10-CM

## 2019-08-18 DIAGNOSIS — Z114 Encounter for screening for human immunodeficiency virus [HIV]: Secondary | ICD-10-CM

## 2019-08-18 LAB — POCT GLYCOSYLATED HEMOGLOBIN (HGB A1C): HbA1c, POC (controlled diabetic range): 12.2 % — AB (ref 0.0–7.0)

## 2019-08-18 LAB — POCT INR: INR: 1.9 — AB (ref 2.0–3.0)

## 2019-08-18 MED ORDER — DOXYCYCLINE HYCLATE 100 MG PO TABS: 100.0000 mg | ORAL_TABLET | Freq: Two times a day (BID) | ORAL | 0 refills | Status: DC

## 2019-08-18 NOTE — Assessment & Plan Note (Signed)
INR is 1.9 today.  She missed a dose of her warfarin earlier this week.  Recommend continuing current and repeat follow-up given that she is on antibiotics.

## 2019-08-18 NOTE — Assessment & Plan Note (Addendum)
Acute problem that is worsening.  Although I do think there is a surrounding area of cellulitis and some drainage, I think the underlying etiology of this may be actually related to either venous stasis or less likely pyoderma gangrenosum given her poorly controlled diabetes.  Vasculitis may also be playing a role.  The location is most consistent with the vascular etiology.  We will treat with doxycycline for 10 days.  Referral to wound care.  At follow-up if this is not improved recommend referral to [facility] clinic for consideration of biopsy.  Discussed case with Dr. McDiarmid who agrees with above.  She may also benefit from ABIs with vascular surgery in the future.

## 2019-08-18 NOTE — Patient Instructions (Addendum)
1. Call the wound care center to make an appointment Address: [address], [place], [place] [address] Hours:  Open ? Closes 5PM Phone: [phone]  Follow up in 2 weeks  Your antibiotic is at the pharmacy  Continue with local wound care  I will call you with results  It was wonderful to see you today.  Thank you for choosing [facility].   Please call [phone] with any questions about today's appointment.  Please be sure to schedule follow up at the front  desk before you leave today.   Dorris Singh, MD  Family Medicine

## 2019-08-18 NOTE — Progress Notes (Signed)
Subjective  Krystal Little is a 64 y.o. female is presenting with the following: right lower extremity 'spider bite'.   Patient reports sometime prior to [place] Year she noted a spot on her medial right lower leg. She was seen at Urgent Care, given doxycycline--- she had little improvement. Since then has been applying triple antibiotic ointment and keeping it wrapped. She has noted enlargement with pain around the site--worse over the last week. No fevers, chills, nausea, vomiting, chest pain, palpitations. Eating and drinking well.   Diabetes Reports she is taking metformin as prescribed. Denies polyuria or polydipsia.  Atrial fibrillation Reports compliance with warfarin. Taking Medication as on medication list 5 mg most days, 7.5 mg MF.  Denies easy bleeding or bruising.   Objective Vital Signs reviewed BP 134/72   Pulse 83   Wt 141 lb 6.4 oz (64.1 kg)   SpO2 97%   BMI 24.27 kg/m    HEENT: Sclera anicteric.  Neck: Supple Cardiac: Regular rate and rhythm. Normal S1/S2.  Lungs: Clear bilaterally to ascultation.  Extremities: 1+ DP pulses Lower extremities in stocking distribution are hyperpigmented.  There is some varicosity present. Right lower extremity with 4 cm erythematous ulceration with granulation tissue that is slightly purulent surrounding area of erythema extending superiorly approximately 1 cm.  See image in chart.  There is minimal warmth  There is minimal edema as compared to the other leg.       Assessments/Plans  DM (diabetes mellitus), type 2, uncontrolled (HCC) A1c continues to be elevated.  Resulted after time of appointment.  Will call patient and discuss options.  She has had longstanding hyperglycemia and will likely need insulin to control her type 2 diabetes.  Chronic atrial fibrillation; CHA2DS2-VASc Score 3 - on Warfarin INR is 1.9 today.  She missed a dose of her warfarin earlier this week.  Recommend continuing current and repeat follow-up given  that she is on antibiotics.  Wound of RLE  Acute problem that is worsening.  Although I do think there is a surrounding area of cellulitis and some drainage, I think the underlying etiology of this may be actually related to either venous stasis or less likely pyoderma gangrenosum given her poorly controlled diabetes.  Vasculitis may also be playing a role.  The location is most consistent with the vascular etiology.  We will treat with doxycycline for 10 days.  Referral to wound care.  At follow-up if this is not improved recommend referral to [facility] clinic for consideration of biopsy.  Discussed case with Dr. McDiarmid who agrees with above.  She may also benefit from ABIs with vascular surgery in the future.   See after visit summary for details of patient instructions  Dorris Singh, MD  [facility] Medicine Teaching Service

## 2019-08-18 NOTE — Assessment & Plan Note (Signed)
A1c continues to be elevated.  Resulted after time of appointment.  Will call patient and discuss options.  She has had longstanding hyperglycemia and will likely need insulin to control her type 2 diabetes.

## 2019-08-18 NOTE — Telephone Encounter (Signed)
Attempted to call patient with results.  Left voicemail on private voicemail to continue current warfarin dose.   Dorris Singh, MD  Family Medicine Teaching Service

## 2019-08-19 LAB — HIV ANTIBODY (ROUTINE TESTING W REFLEX): HIV Screen 4th Generation wRfx: NONREACTIVE

## 2019-08-19 LAB — COMPREHENSIVE METABOLIC PANEL
ALT: 31 IU/L (ref 0–32)
AST: 26 IU/L (ref 0–40)
Albumin/Globulin Ratio: 1.6 (ref 1.2–2.2)
Albumin: 4.4 g/dL (ref 3.8–4.8)
Alkaline Phosphatase: 135 IU/L — ABNORMAL HIGH (ref 39–117)
BUN/Creatinine Ratio: 15 (ref 12–28)
BUN: 12 mg/dL (ref 8–27)
Bilirubin Total: 0.6 mg/dL (ref 0.0–1.2)
CO2: 24 mmol/L (ref 20–29)
Calcium: 9.2 mg/dL (ref 8.7–10.3)
Chloride: 100 mmol/L (ref 96–106)
Creatinine, Ser: 0.79 mg/dL (ref 0.57–1.00)
GFR calc Af Amer: 91 mL/min/{1.73_m2} (ref >59)
GFR calc non Af Amer: 79 mL/min/{1.73_m2} (ref >59)
Globulin, Total: 2.7 g/dL (ref 1.5–4.5)
Glucose: 306 mg/dL — ABNORMAL HIGH (ref 65–99)
Potassium: 3.8 mmol/L (ref 3.5–5.2)
Sodium: 140 mmol/L (ref 134–144)
Total Protein: 7.1 g/dL (ref 6.0–8.5)

## 2019-08-19 LAB — CBC
Hematocrit: 45.9 % (ref 34.0–46.6)
Hemoglobin: 15.2 g/dL (ref 11.1–15.9)
MCH: 31 pg (ref 26.6–33.0)
MCHC: 33.1 g/dL (ref 31.5–35.7)
MCV: 94 fL (ref 79–97)
Platelets: 168 10*3/uL (ref 150–450)
RBC: 4.91 x10E6/uL (ref 3.77–5.28)
RDW: 12.2 % (ref 11.7–15.4)
WBC: 4.3 10*3/uL (ref 3.4–10.8)

## 2019-08-19 LAB — HCV COMMENT:

## 2019-08-19 LAB — HEPATITIS C ANTIBODY (REFLEX): HCV Ab: 0.3 s/co ratio (ref 0.0–0.9)

## 2019-08-19 LAB — RPR: RPR Ser Ql: NONREACTIVE

## 2019-08-20 ENCOUNTER — Telehealth: Payer: Self-pay | Admitting: Family Medicine

## 2019-08-20 ENCOUNTER — Encounter: Payer: Self-pay | Admitting: Family Medicine

## 2019-08-20 NOTE — Telephone Encounter (Signed)
Called Patient X 3. Left two separate voicemails.  Elevated alkaline phosphatase-- possibly due to infection, recommend repeat at follow up. Consider GGT, review EtOH use.  Elevated A1C recommend adding SGLT2/GLP1 pending patient preference. Would likely benefit from CCM referral.  Sent patient letter, she has follow up with PCP.   Dorris Singh, MD  Family Medicine Teaching Service

## 2019-08-22 NOTE — Telephone Encounter (Signed)
Patient LVM on nurse line in regards to results. I attempted to call her, however no answer. I left a generic VM for her to call me back to discuss.

## 2019-08-26 ENCOUNTER — Other Ambulatory Visit: Payer: Self-pay | Admitting: Cardiology

## 2019-08-26 NOTE — Telephone Encounter (Signed)
Rx has been sent to the pharmacy electronically. ° °

## 2019-08-27 ENCOUNTER — Other Ambulatory Visit: Payer: Self-pay

## 2019-08-27 ENCOUNTER — Ambulatory Visit: Payer: Medicare Other | Admitting: Cardiology

## 2019-08-27 ENCOUNTER — Encounter: Payer: Self-pay | Admitting: Cardiology

## 2019-08-27 VITALS — BP 142/84 | HR 68 | Ht 64.0 in | Wt 143.2 lb

## 2019-08-27 DIAGNOSIS — I10 Essential (primary) hypertension: Secondary | ICD-10-CM

## 2019-08-27 DIAGNOSIS — I482 Chronic atrial fibrillation, unspecified: Secondary | ICD-10-CM | POA: Diagnosis not present

## 2019-08-27 DIAGNOSIS — E1159 Type 2 diabetes mellitus with other circulatory complications: Secondary | ICD-10-CM | POA: Diagnosis not present

## 2019-08-27 DIAGNOSIS — Z9889 Other specified postprocedural states: Secondary | ICD-10-CM | POA: Diagnosis not present

## 2019-08-27 DIAGNOSIS — I152 Hypertension secondary to endocrine disorders: Secondary | ICD-10-CM

## 2019-08-27 DIAGNOSIS — Z950 Presence of cardiac pacemaker: Secondary | ICD-10-CM

## 2019-08-27 DIAGNOSIS — E785 Hyperlipidemia, unspecified: Secondary | ICD-10-CM

## 2019-08-27 DIAGNOSIS — E1169 Type 2 diabetes mellitus with other specified complication: Secondary | ICD-10-CM

## 2019-08-27 DIAGNOSIS — I5189 Other ill-defined heart diseases: Secondary | ICD-10-CM

## 2019-08-27 DIAGNOSIS — I080 Rheumatic disorders of both mitral and aortic valves: Secondary | ICD-10-CM | POA: Diagnosis not present

## 2019-08-27 NOTE — Progress Notes (Signed)
Primary Care Provider: Stark Klein, MD Cardiologist: Glenetta Hew, MD Electrophysiologist: Will Meredith Leeds, MD  Clinic Note: Chief Complaint  Patient presents with  . Follow-up    Annual   . Atrial Fibrillation  . Cardiac Valve Problem    Status post mitral valve repair    HPI:    Krystal Little is a 64 y.o. 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 December 2000: Mitral valve repair with ring annuloplasty and transposition of chordae (Dr. Cyndia Bent) ? Medtronic pacemaker placement by Dr. Rollene Fare --now followed by Dr. Curt Bears  Chronic persistent A. fib and flutter-attempted flutter ablation. Also failed antiarrhythmics including Sotalol  and Tikosyn  CHA2DS2-VASc Score=3, on warfarin  Krystal Little was last seen in November 2019  Recent Hospitalizations:   Urgent Care Evaluation July 16, 2019--right lower extremity abscess thought to be related to a bug bite.  Reviewed  CV studies:    The following studies were reviewed today: (if available, images/films reviewed: From Epic Chart or Care Everywhere) . Echo (May 27, 2018): Normal LV size and thickness.  EF estimated 50%.  Inferobasal hypokinesis and flattened septum consistent with elevated PA pressures.  Moderate left atrial dilation and mild right atrial dilation.  Mean PA pressures estimated 52 mmHg.  Mild functional mitral stenosis at rest (postoperative).   Interval History:   Krystal Little returns here today for her routine follow-up stating that she is doing quite well.  She really has no sense whatsoever of having irregular heartbeats palpitations.  She has a pacemaker in place  and has not had any syncope or near syncope.   She has been making a concerted effort with diet and exercise did lose significant weight since her last visit.  She is pretty much asymptomatic from cardiac standpoint.  CV Review of Symptoms (Summary) ROS: no chest pain or dyspnea on exertion positive for - palpitations negative for - edema, orthopnea, paroxysmal nocturnal dyspnea, rapid heart rate, shortness of breath or Syncope/near syncope, TIA shows amaurosis fugax, claudication  The patient does not have symptoms concerning for COVID-19 infection (fever, chills, cough, or new shortness of breath).  The patient is practicing social distancing & Masking.   --> She is scheduled to have her Covid vaccine in the next few weeks.  REVIEWED OF SYSTEMS   A comprehensive ROS was performed. Review of Systems  Constitutional: Negative for malaise/fatigue.  HENT: Negative for nosebleeds.   Gastrointestinal: Negative for blood in stool and melena.  Genitourinary: Negative for hematuria.  Musculoskeletal: Negative for joint pain.  Neurological: Negative for dizziness.  Psychiatric/Behavioral: Negative for memory loss. The patient is not nervous/anxious and does not have insomnia.    I have reviewed and (if needed) personally updated the patient's problem list, medications, allergies, past medical and surgical history, social and family history.   PAST MEDICAL HISTORY   Past Medical History:  Diagnosis Date  . Chronic atrial fibrillation ([place]) 1990s   s/p DCCV then attempted ablation of complex A Flutter (@ [facility] - Dr. Deno Etienne), failed antiarrhythmics --> INR folllwed @ [facility] ->> now status post pacemaker placement with underlying A. fib.  . Diabetes mellitus type 2, uncontrolled, without complications    On Oral Medications ([facility])  . Essential hypertension   .  History of cardiac catheterization 2001   R&LHC - normal Coronaries, no evidence of Restictive Cardiomyopathy or Constrictive  Pericarditis (also @ [facility])  . Hx of sick sinus syndrome 07/1999   Wtih symptomatic bradycardia - syncope ([place])  . 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 CARDIAC CATHETERIZATION  08/03/1999   [place] - normal Coronaries; no sign of constriction or restrictive cardiomypathy  . MITRAL VALVE REPAIR  07/1999   Both Ant& Post leaflet repair - quadrangular resection&caudal transposition, #32 Sequin Annuloplasty ring  . PACEMAKER GENERATOR CHANGE  11/26/2008   Medtronic Adapta L(? if it has been checked since 02/2013)  . PACEMAKER INSERTION  07/1999   for SSS in setting of Chronic Afib; Medtronic Kappa [id number], [id number] H.  . TRANSTHORACIC ECHOCARDIOGRAM  05/2018   Normal LV size and thickness.  EF estimated 50%.  Inferobasal HK and flattened septum c/w with elevated PA pressures. Mild functional mitral stenosis at rest (postoperative).  Moderate left atrial dilation and mild right atrial dilation.  Mean PA pressures estimated 52 mmHg.    MEDICATIONS/ALLERGIES   Current Meds  Medication Sig  . atenolol (TENORMIN) 50 MG tablet TAKE 1 TABLET(50 MG) BY MOUTH DAILY  . diltiazem (CARTIA XT) 180 MG 24 hr capsule TAKE 1 CAPSULE(180 MG) BY MOUTH DAILY  . doxycycline (VIBRA-TABS) 100 MG tablet Take 1 tablet (100 mg total) by mouth 2 (two) times daily.  Marland Kitchen ezetimibe (ZETIA) 10 MG tablet Take 1 tablet (10 mg total) by mouth daily.  Marland Kitchen lisinopril (ZESTRIL) 5 MG tablet TAKE 1 TABLET(5 MG) BY MOUTH DAILY  . metFORMIN (GLUCOPHAGE-XR) 500 MG 24 hr tablet Take 500 mg  by mouth 2 (two) times daily.   Marland Kitchen warfarin (COUMADIN) 5 MG tablet TAKE 1 TABLET BY MOUTH DAILY EXCEPT TAKE 1 AND 1/2 TABLET ON MONDAY AND FRIDAY    No Known Allergies  SOCIAL HISTORY/FAMILY HISTORY   Social History   Tobacco Use  . Smoking status: Never Smoker  . Smokeless tobacco: Never Used  Substance Use Topics  . Alcohol use: Yes    Alcohol/week: 0.0 standard drinks    Comment: occasional, once every month or two- glass of wine   . Drug use: No   Social History   Social History Narrative   Lives with husband, great-niece (18)  and great-nephew (35).    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   1 son died in an automobile accident @ age 73 (2010).    She raised several nieces & nephews - now lives with her 16 y/o daughter (who recently moved back in)   Routinely walks 2-3 days/week.   Started new job through the [employer].    Family History family history includes Cancer in her brother; Diabetes in her sister; Hypertension in her mother; Lung cancer in her brother.  OBJCTIVE -PE, EKG, labs   Wt Readings from Last 3 Encounters:  08/27/19 143 lb 3.2 oz (65 kg)  08/18/19 141 lb 6.4 oz (64.1 kg)  07/15/18 143 lb 6 oz (  65 kg)    Physical Exam: BP (!) 142/84   Pulse 68   Ht 5\' 4"  (1.626 m)   Wt 143 lb 3.2 oz (65 kg)   BMI 24.58 kg/m  Physical Exam  Constitutional: She is oriented to person, place, and time. She appears well-developed and well-nourished. No distress.  Well-groomed.  Healthy-appearing.  HENT:  Head: Normocephalic and atraumatic.  Cardiovascular: Normal rate, regular rhythm and intact distal pulses. PMI is not displaced. Exam reveals distant heart sounds. Exam reveals no gallop and no friction rub.  Murmur heard.  Medium-pitched blowing holosystolic murmur is present with a grade of 1/6 at the lower left sternal border. Normal S1, split S2  Pulmonary/Chest: Effort normal and breath sounds normal. No respiratory  distress. She has no wheezes. She has no rales.  Abdominal: Soft. Bowel sounds are normal. She exhibits no distension. There is no abdominal tenderness. There is no rebound.  Musculoskeletal:        General: No edema. Normal range of motion.  Neurological: She is alert and oriented to person, place, and time.  Psychiatric: She has a normal mood and affect. Her behavior is normal. Judgment and thought content normal.  Vitals reviewed.    Adult ECG Report  Rate: 68 ;  Rhythm: Underlying rhythm likely A. fib/flutter; ventricular pacing.;   Narrative Interpretation: Stable ventricular paced rhythm.  Recent Labs: November 11, 2018: TC 159, TG 101, LDL 75, HDL 61. 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  Thankfully, not having any heart failure symptoms.  No PND orthopnea.  Is on beta-blocker and calcium blocker as well as ACE inhibitor.  Not currently on any diuretic.      MITRAL REGURGITATION - status post MVR - Primary (Chronic)    Most recent echocardiogram shows relatively normal mitral valve for postop findings.  Elevated PA pressures is somewhat concerning, however she is not really noticing any dyspnea.  Partly related to underlying arrhythmia.  Probably not due to recheck an echo for another year.      Relevant Orders   EKG 12-Lead (Completed)   Chronic atrial fibrillation;  CHA2DS2-VASc Score 3 - on Warfarin (Chronic)    Underlying A. fib.  Remains on warfarin.  Has had multiple attempts at rate and rhythm control, eventually underwent pacemaker placement for sick sinus syndrome and now is on combination atenolol plus stop diltiazem for rate control.  We have been unable to restore sinus rhythm.  On warfarin for anticoagulation.  Has had stability stable INRs.  I recommended that she allow her INR to drift down a little bit roughly 2-2.5 for when she has her COVID-19 shot.      Relevant Orders   EKG 12-Lead (Completed)   Hypertension associated with diabetes ([place]) (Chronic)  Borderline blood pressure elevation today, but usually at home it is pretty well controlled.  She is on atenolol and diltiazem as well as lisinopril.  Does have room to go up on lisinopril if her blood pressure continues to be elevated.      S/P MVR (mitral valve repair) (Chronic)   S/P placement of cardiac pacemaker (Chronic)   Hyperlipidemia associated with type 2 diabetes mellitus (HCC) (Chronic)    Lipids are pretty well controlled as of last year.  She is due to have labs checked in the April and May of this year as well.  She is on Zetia only.  She asked about whether or not she should stay on it, she had intolerance of statin in the past.  Based on how well her lipids responded, I would say that Zetia has worked quite well.  We should continue on with 10 mg Zetia.          COVID-19 Education: The signs and symptoms of COVID-19 were discussed with the patient and how to seek care for testing (follow up with PCP or arrange E-visit).   The importance of social distancing was discussed today.  I spent a total of 38minutes with the patient. >  50% of the time was spent in direct patient consultation.  Additional time spent with chart review  / charting (studies, outside notes, etc): 6 Total Time: 21 min   Current medicines are reviewed at length with the patient today.  (+/-  concerns) n/a   Patient Instructions / Medication Changes & Studies & Tests Ordered   Patient Instructions  Medication Instructions:  No changes  *If you need a refill on your cardiac medications before your next appointment, please call your pharmacy*  Lab Work: Please ask your primary to send copy of your labs  Testing/Procedures: Not neede  Follow-Up: At [facility], you and your health needs are our priority.  As part of our continuing mission to provide you with exceptional heart care, we have created designated Provider Care Teams.  These Care Teams include your primary Cardiologist (physician) and Advanced Practice Providers (APPs -  Physician Assistants and Nurse Practitioners) who all work together to provide you with the care you need, when you need it.  Your next appointment:   12 month(s)  The format for your next appointment:   In Person  Provider:   Glenetta Hew, MD  Other Instructions n/a    Studies Ordered:   Orders Placed This Encounter  Procedures  . EKG 12-Lead     Glenetta Hew, M.D., M.S. Interventional Cardiologist   Pager # [phone] Phone # [phone] [address]. [place], [place] [address]   Thank you for choosing Heartcare at [facility]!!

## 2019-08-27 NOTE — Patient Instructions (Signed)
Medication Instructions:  No changes  *If you need a refill on your cardiac medications before your next appointment, please call your pharmacy*  Lab Work: Please ask your primary to send copy of your labs  Testing/Procedures: Not neede  Follow-Up: At [facility], you and your health needs are our priority.  As part of our continuing mission to provide you with exceptional heart care, we have created designated Provider Care Teams.  These Care Teams include your primary Cardiologist (physician) and Advanced Practice Providers (APPs -  Physician Assistants and Nurse Practitioners) who all work together to provide you with the care you need, when you need it.  Your next appointment:   12 month(s)  The format for your next appointment:   In Person  Provider:   Glenetta Hew, MD  Other Instructions n/a

## 2019-08-29 ENCOUNTER — Encounter: Payer: Self-pay | Admitting: Cardiology

## 2019-08-29 NOTE — Assessment & Plan Note (Signed)
Lipids are pretty well controlled as of last year.  She is due to have labs checked in the April and May of this year as well.  She is on Zetia only.  She asked about whether or not she should stay on it, she had intolerance of statin in the past.  Based on how well her lipids responded, I would say that Zetia has worked quite well.  We should continue on with 10 mg Zetia.

## 2019-08-29 NOTE — Assessment & Plan Note (Signed)
Underlying A. fib.  Remains on warfarin.  Has had multiple attempts at rate and rhythm control, eventually underwent pacemaker placement for sick sinus syndrome and now is on combination atenolol plus stop diltiazem for rate control.  We have been unable to restore sinus rhythm.  On warfarin for anticoagulation.  Has had stability stable INRs.  I recommended that she allow her INR to drift down a little bit roughly 2-2.5 for when she has her COVID-19 shot.

## 2019-08-29 NOTE — Assessment & Plan Note (Signed)
She does have diastolic dysfunction is as indicated by the LA dilation and elevated pulmonary pressures.  Thankfully, not having any heart failure symptoms.  No PND orthopnea.  Is on beta-blocker and calcium blocker as well as ACE inhibitor.  Not currently on any diuretic.

## 2019-08-29 NOTE — Assessment & Plan Note (Signed)
Borderline blood pressure elevation today, but usually at home it is pretty well controlled.  She is on atenolol and diltiazem as well as lisinopril.  Does have room to go up on lisinopril if her blood pressure continues to be elevated.

## 2019-08-29 NOTE — Assessment & Plan Note (Signed)
Most recent echocardiogram shows relatively normal mitral valve for postop findings.  Elevated PA pressures is somewhat concerning, however she is not really noticing any dyspnea.  Partly related to underlying arrhythmia.  Probably not due to recheck an echo for another year.

## 2019-09-02 ENCOUNTER — Other Ambulatory Visit: Payer: Self-pay

## 2019-09-02 ENCOUNTER — Encounter (HOSPITAL_BASED_OUTPATIENT_CLINIC_OR_DEPARTMENT_OTHER): Payer: Medicare Other | Attending: Internal Medicine | Admitting: Internal Medicine

## 2019-09-02 DIAGNOSIS — I87332 Chronic venous hypertension (idiopathic) with ulcer and inflammation of left lower extremity: Secondary | ICD-10-CM | POA: Insufficient documentation

## 2019-09-02 DIAGNOSIS — Z950 Presence of cardiac pacemaker: Secondary | ICD-10-CM | POA: Diagnosis not present

## 2019-09-02 DIAGNOSIS — Z7984 Long term (current) use of oral hypoglycemic drugs: Secondary | ICD-10-CM | POA: Diagnosis not present

## 2019-09-02 DIAGNOSIS — I482 Chronic atrial fibrillation, unspecified: Secondary | ICD-10-CM | POA: Insufficient documentation

## 2019-09-02 DIAGNOSIS — I10 Essential (primary) hypertension: Secondary | ICD-10-CM | POA: Insufficient documentation

## 2019-09-02 DIAGNOSIS — L97212 Non-pressure chronic ulcer of right calf with fat layer exposed: Secondary | ICD-10-CM | POA: Insufficient documentation

## 2019-09-02 DIAGNOSIS — Z952 Presence of prosthetic heart valve: Secondary | ICD-10-CM | POA: Insufficient documentation

## 2019-09-02 DIAGNOSIS — Z86718 Personal history of other venous thrombosis and embolism: Secondary | ICD-10-CM | POA: Insufficient documentation

## 2019-09-02 DIAGNOSIS — E11622 Type 2 diabetes mellitus with other skin ulcer: Secondary | ICD-10-CM | POA: Insufficient documentation

## 2019-09-02 DIAGNOSIS — Z7901 Long term (current) use of anticoagulants: Secondary | ICD-10-CM | POA: Insufficient documentation

## 2019-09-02 DIAGNOSIS — I87331 Chronic venous hypertension (idiopathic) with ulcer and inflammation of right lower extremity: Secondary | ICD-10-CM | POA: Diagnosis not present

## 2019-09-02 DIAGNOSIS — E1165 Type 2 diabetes mellitus with hyperglycemia: Secondary | ICD-10-CM | POA: Insufficient documentation

## 2019-09-04 ENCOUNTER — Ambulatory Visit: Payer: Medicare Other | Admitting: Family Medicine

## 2019-09-09 ENCOUNTER — Encounter (HOSPITAL_BASED_OUTPATIENT_CLINIC_OR_DEPARTMENT_OTHER): Payer: Medicare Other | Admitting: Internal Medicine

## 2019-09-09 ENCOUNTER — Other Ambulatory Visit: Payer: Self-pay

## 2019-09-09 DIAGNOSIS — E11622 Type 2 diabetes mellitus with other skin ulcer: Secondary | ICD-10-CM | POA: Diagnosis not present

## 2019-09-09 DIAGNOSIS — I10 Essential (primary) hypertension: Secondary | ICD-10-CM | POA: Diagnosis not present

## 2019-09-09 DIAGNOSIS — Z7984 Long term (current) use of oral hypoglycemic drugs: Secondary | ICD-10-CM | POA: Diagnosis not present

## 2019-09-09 DIAGNOSIS — I87332 Chronic venous hypertension (idiopathic) with ulcer and inflammation of left lower extremity: Secondary | ICD-10-CM | POA: Diagnosis not present

## 2019-09-09 DIAGNOSIS — I87311 Chronic venous hypertension (idiopathic) with ulcer of right lower extremity: Secondary | ICD-10-CM | POA: Diagnosis not present

## 2019-09-09 DIAGNOSIS — Z7901 Long term (current) use of anticoagulants: Secondary | ICD-10-CM | POA: Diagnosis not present

## 2019-09-09 DIAGNOSIS — Z86718 Personal history of other venous thrombosis and embolism: Secondary | ICD-10-CM | POA: Diagnosis not present

## 2019-09-09 DIAGNOSIS — Z950 Presence of cardiac pacemaker: Secondary | ICD-10-CM | POA: Diagnosis not present

## 2019-09-09 DIAGNOSIS — L97212 Non-pressure chronic ulcer of right calf with fat layer exposed: Secondary | ICD-10-CM | POA: Diagnosis not present

## 2019-09-09 DIAGNOSIS — Z952 Presence of prosthetic heart valve: Secondary | ICD-10-CM | POA: Diagnosis not present

## 2019-09-09 DIAGNOSIS — I482 Chronic atrial fibrillation, unspecified: Secondary | ICD-10-CM | POA: Diagnosis not present

## 2019-09-09 DIAGNOSIS — E1165 Type 2 diabetes mellitus with hyperglycemia: Secondary | ICD-10-CM | POA: Diagnosis not present

## 2019-09-09 NOTE — Progress Notes (Signed)
Krystal Little, Krystal Little ([id number]) Visit Report for 09/09/2019 Debridement Details Patient Name: Date of Service: Krystal, Little 09/09/2019 4:00 PM Medical Record [id number] Patient Account Number: [id number] Date of Birth/Sex: Treating RN: March 07, 1956 (64 y.o. [name] Primary Care Provider: Stark Klein Other Clinician: Referring Provider: Treating Provider/Extender:Cortland Crehan, Kathee Delton, MAKIERA Weeks in Treatment: 1 Debridement Performed for Wound #3 Right,Medial Lower Leg Assessment: Performed By: Physician Ricard Dillon., MD Debridement Type: Debridement Severity of Tissue Pre Fat layer exposed Debridement: Level of Consciousness (Pre- Awake and Alert procedure): Pre-procedure Verification/Time Out Taken: Yes - 16:11 Start Time: 16:11 Pain Control: Other : benzocaine 20% Total Area Debrided (L x W): 4.3 (cm) x 1.5 (cm) = 6.45 (cm) Tissue and other material Viable, Non-Viable, Slough, Subcutaneous, Skin: Dermis , Skin: Epidermis, Slough debrided: Level: Skin/Subcutaneous Tissue Debridement Description: Excisional Instrument: Curette Bleeding: Moderate Hemostasis Achieved: Pressure End Time: 16:13 Procedural Pain: 0 Post Procedural Pain: 0 Response to Treatment: Procedure was tolerated well Level of Consciousness Awake and Alert (Post-procedure): Post Debridement Measurements of Total Wound Length: (cm) 4.3 Width: (cm) 1.5 Depth: (cm) 0.1 Volume: (cm) 0.507 Character of Wound/Ulcer Post Improved Debridement: Severity of Tissue Post Debridement: Fat layer exposed Post Procedure Diagnosis Same as Pre-procedure Electronic Signature(s) Signed: 09/09/2019 6:00:34 PM By: Linton Ham MD Signed: 09/09/2019 6:06:02 PM By: Carlene Coria RN Entered By: Linton Ham on 09/09/2019 17:42:42 -------------------------------------------------------------------------------- HPI Details Patient Name: Date of Service: Krystal Little 09/09/2019  4:00 PM Medical Record [id number] Patient Account Number: [id number] Date of Birth/Sex: Treating RN: 05-Dec-1955 (64 y.o. [name] Primary Care Provider: Stark Klein Other Clinician: Referring Provider: Treating Provider/Extender:Eliseo Withers, Kathee Delton, MAKIERA Weeks in Treatment: 1 History of Present Illness HPI Description: ADMISSION 09/02/2019 This is a 64 year old woman who is a poorly controlled type II diabetic with a recent hemoglobin A1c of 12.2. She tells [place] that she developed a wound on her right medial calf sometime in November. She is not exactly sure how this happened. She wonders if she got bit by something. She went to the ER at the end of December and they felt this was an abscess. They gave her doxycycline they did not culture this and she used topical Neosporin. At that point it was apparently draining. On 08/18/2019 she saw her primary physician at family practice. Correctly noted that this was probably venous insufficiency but listed in the differential for an inflammatory ulcer of uncertain etiology and wondered about a biopsy. They gave her an additional 10 days of doxycycline and she has been using triple antibiotics. She says the wound actually has been getting larger. Past medical history includes type 2 diabetes uncontrolled, chronic atrial fibrillation on Coumadin, status post mitral valve replacement, pacemaker, hyperlipidemia and hypertension. ABIs in our clinic were 0.80 on the right however the patient's pulses in her foot at both the dorsalis pedis and posterior tibial are vibrant 2/23; patient wound actually looks somewhat better. Still several open areas. Debris on the surface. We have been using Iodoflex under compression. Electronic Signature(s) Signed: 09/09/2019 6:00:34 PM By: Linton Ham MD Entered By: Linton Ham on 09/09/2019 17:43:17 -------------------------------------------------------------------------------- Physical  Exam Details Patient Name: Date of Service: Krystal Little 09/09/2019 4:00 PM Medical Record [id number] Patient Account Number: [id number] Date of Birth/Sex: Treating RN: 10-25-1955 (64 y.o. [name] Primary Care Provider: Stark Klein Other Clinician: Referring Provider: Treating Provider/Extender:[name], Kathee Delton, MAKIERA Weeks in Treatment: 1 Notes Wound exam; in [place] the area looks better. Still requiring debridement with a #3  curette adherent fibrinous surface debris. Hemostasis with direct pressure. Electronic Signature(s) Signed: 09/09/2019 6:00:34 PM By: Linton Ham MD Entered By: Linton Ham on 09/09/2019 17:44:51 -------------------------------------------------------------------------------- Physician Orders Details Patient Name: Date of Service: ESTRELLITA, [name] 09/09/2019 4:00 PM Medical Record [id number] Patient Account Number: [id number] Date of Birth/Sex: Treating RN: October 13, 1955 (64 y.o. [name] Primary Care Provider: Stark Klein Other Clinician: Referring Provider: Treating Provider/Extender:[name], Kathee Delton, MAKIERA Weeks in Treatment: 1 Verbal / Phone Orders: No Diagnosis Coding ICD-10 Coding Code Description [phone] Non-pressure chronic ulcer of right calf with fat layer exposed I87.332 Chronic venous hypertension (idiopathic) with ulcer and inflammation of left lower extremity E11.622 Type 2 diabetes mellitus with other skin ulcer Follow-up Appointments Return Appointment in 1 week. Dressing Change Frequency Do not change entire dressing for one week. Skin Barriers/Peri-Wound Care TCA Cream or Ointment Wound Cleansing May shower with protection. Primary Wound Dressing Wound #3 Right,Medial Lower Leg Iodoflex Secondary Dressing Wound #3 Right,Medial Lower Leg Dry Gauze ABD pad Edema Control Wound #3 Right,Medial Lower Leg 3 Layer Compression System - Right Lower Extremity Electronic  Signature(s) Signed: 09/09/2019 6:00:34 PM By: Linton Ham MD Signed: 09/09/2019 6:06:02 PM By: Carlene Coria RN Entered By: Carlene Coria on 09/09/2019 16:23:27 -------------------------------------------------------------------------------- Problem List Details Patient Name: Date of Service: Krystal Little 09/09/2019 4:00 PM Medical Record [id number] Patient Account Number: [id number] Date of Birth/Sex: Treating RN: 07/24/1955 (64 y.o. [name] Primary Care Provider: Stark Klein Other Clinician: Referring Provider: Treating Provider/Extender:Shresta Risden, Kathee Delton, MAKIERA Weeks in Treatment: 1 Active Problems ICD-10 Evaluated Encounter Code Description Active Date Today Diagnosis L97.212 Non-pressure chronic ulcer of right calf with fat layer 09/02/2019 No Yes exposed I87.332 Chronic venous hypertension (idiopathic) with ulcer 09/02/2019 No Yes and inflammation of left lower extremity E11.622 Type 2 diabetes mellitus with other skin ulcer 09/02/2019 No Yes Inactive Problems Resolved Problems Electronic Signature(s) Signed: 09/09/2019 6:00:34 PM By: Linton Ham MD Entered By: Linton Ham on 09/09/2019 17:42:10 -------------------------------------------------------------------------------- Progress Note Details Patient Name: Date of Service: Krystal Little 09/09/2019 4:00 PM Medical Record [id number] Patient Account Number: [id number] Date of Birth/Sex: Treating RN: September 06, 1955 (64 y.o. [name] Primary Care Provider: Stark Klein Other Clinician: Referring Provider: Treating Provider/Extender:Hrithik Boschee, Kathee Delton, MAKIERA Weeks in Treatment: 1 Subjective History of Present Illness (HPI) ADMISSION 09/02/2019 This is a 64 year old woman who is a poorly controlled type II diabetic with a recent hemoglobin A1c of 12.2. She tells [place] that she developed a wound on her right medial calf sometime in November. She is not exactly  sure how this happened. She wonders if she got bit by something. She went to the ER at the end of December and they felt this was an abscess. They gave her doxycycline they did not culture this and she used topical Neosporin. At that point it was apparently draining. On 08/18/2019 she saw her primary physician at family practice. Correctly noted that this was probably venous insufficiency but listed in the differential for an inflammatory ulcer of uncertain etiology and wondered about a biopsy. They gave her an additional 10 days of doxycycline and she has been using triple antibiotics. She says the wound actually has been getting larger. Past medical history includes type 2 diabetes uncontrolled, chronic atrial fibrillation on Coumadin, status post mitral valve replacement, pacemaker, hyperlipidemia and hypertension. ABIs in our clinic were 0.80 on the right however the patient's pulses in her foot at both the dorsalis pedis and posterior tibial are vibrant 2/23; patient wound actually  looks somewhat better. Still several open areas. Debris on the surface. We have been using Iodoflex under compression. Objective Constitutional Vitals Time Taken: 4:53 PM, Height: 64 in, Weight: 140 lbs, BMI: 24, Temperature: 98.1 F, Pulse: 76 bpm, Respiratory Rate: 16 breaths/min, Blood Pressure: 158/79 mmHg. Integumentary (Hair, Skin) Wound #3 status is Open. Original cause of wound was Bump. The wound is located on the Right,Medial Lower Leg. The wound measures 4.3cm length x 1.5cm width x 0.1cm depth; 5.066cm^2 area and 0.507cm^3 volume. There is Fat Layer (Subcutaneous Tissue) Exposed exposed. There is no tunneling or undermining noted. There is a medium amount of serosanguineous drainage noted. The wound margin is flat and intact. There is medium (34-66%) red, pink granulation within the wound bed. There is a medium (34-66%) amount of necrotic tissue within the wound bed including Eschar and Adherent  Slough. 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 There was a Excisional Skin/Subcutaneous Tissue Debridement with a total area of 6.45 sq cm performed by Ricard Dillon., MD. With the following instrument(s): Curette to remove Viable and Non-Viable tissue/material. Material removed includes Subcutaneous Tissue, Slough, Skin: Dermis, and Skin: Epidermis after achieving pain control using Other (benzocaine 20%). No specimens were taken. A time out was conducted at 16:11, prior to the start of the procedure. A Moderate amount of bleeding was controlled with Pressure. The procedure was tolerated well with a pain level of 0 throughout and a pain level of 0 following the procedure. Post Debridement Measurements: 4.3cm length x 1.5cm width x 0.1cm depth; 0.507cm^3 volume. Character of Wound/Ulcer Post Debridement is improved. Severity of Tissue Post Debridement is: Fat layer exposed. Post procedure Diagnosis Wound #3: Same as Pre-Procedure Pre-procedure diagnosis of Wound #3 is a Venous Leg Ulcer located on the Right,Medial Lower Leg . There was a Three Layer Compression Therapy Procedure by Carlene Coria, RN. Post procedure Diagnosis Wound #3: Same as Pre-Procedure Plan Follow-up Appointments: Return Appointment in 1 week. Dressing Change Frequency: Do not change entire dressing for one week. Skin Barriers/Peri-Wound Care: TCA Cream or Ointment Wound Cleansing: May shower with protection. Primary Wound Dressing: Wound #3 Right,Medial Lower Leg: Iodoflex Secondary Dressing: Wound #3 Right,Medial Lower Leg: Dry  Gauze ABD pad Edema Control: Wound #3 Right,Medial Lower Leg: 3 Layer Compression System - Right Lower Extremity 1. I am going to continue the Iodoflex for another week although I think the surface was better. Consider changing to collagen or Hydrofera Blue next week 2. ABDs under 3 layer compression 3. I will continue to monitor this. There is a somewhat atypical-looking appearance to this. However we can get this to close all forego biopsy Electronic Signature(s) Signed: 09/09/2019 6:00:34 PM By: Linton Ham MD Entered By: Linton Ham on 09/09/2019 17:45:50 -------------------------------------------------------------------------------- SuperBill Details Patient Name: Date of Service: Krystal Little 09/09/2019 Medical Record [id number] Patient Account Number: [id number] Date of Birth/Sex: Treating RN: 30-Jul-1955 (64 y.o. [name] Primary Care Provider: Stark Klein Other Clinician: Referring Provider: Treating Provider/Extender:Arthea Nobel, Kathee Delton, MAKIERA Weeks in Treatment: 1 Diagnosis Coding ICD-10 Codes Code Description [phone] Non-pressure chronic ulcer of right calf with fat layer exposed I87.332 Chronic venous hypertension (idiopathic) with ulcer and inflammation of  left lower extremity E11.622 Type 2 diabetes mellitus with other skin ulcer Facility Procedures CPT4 Code: [id number] Description: 70110 - DEB SUBQ TISSUE 20 SQ CM/< ICD-10 Diagnosis Description L97.212 Non-pressure chronic ulcer of right calf with fat layer Modifier: exposed Quantity: 1 Physician Procedures CPT4 Code Description: [id number] - WC PHYS SUBQ TISS 20 SQ CM ICD-10 Diagnosis Description E43.539 Non-pressure chronic ulcer of right calf with fat layer Modifier: exposed Quantity: 1 Electronic Signature(s) Signed: 09/09/2019 6:00:34 PM By: Linton Ham MD Entered By: Linton Ham on 09/09/2019 17:46:03

## 2019-09-11 ENCOUNTER — Telehealth (HOSPITAL_COMMUNITY): Payer: Self-pay

## 2019-09-11 NOTE — Progress Notes (Signed)
Krystal Little, Krystal Little ([id number]) Visit Report for 09/02/2019 Allergy List Details Patient Name: Date of Service: Krystal Little 09/02/2019 9:00 AM Medical Record [id number] Patient Account Number: [id number] Date of Birth/Sex: Treating RN: 1956-03-12 (64 y.o. Krystal Little Primary Care Glendene Wyer: Stark Klein Other Clinician: Referring Vivianne Carles: Treating Megean Fabio/Extender:Robson, Kathee Delton, MAKIERA Weeks in Treatment: 0 Allergies Active Allergies No Known Drug Allergies Allergy Notes Electronic Signature(s) Signed: 09/11/2019 8:56:47 AM By: Levan Hurst RN, BSN Entered By: Levan Hurst on 09/02/2019 09:19:42 -------------------------------------------------------------------------------- Arrival Information Details Patient Name: Date of Service: [name] 09/02/2019 9:00 AM Medical Record [id number] Patient Account Number: [id number] Date of Birth/Sex: Treating RN: 06-15-1956 (64 y.o. Krystal Little Primary Care Anapaula Severt: Stark Klein Other Clinician: Referring Chanese Hartsough: Treating Alizae Bechtel/Extender:Robson, Kathee Delton, MAKIERA Weeks in Treatment: 0 Visit Information Patient Arrived: Ambulatory Arrival Time: 09:17 Accompanied By: alone Transfer Assistance: None Patient Identification Verified: Yes Secondary Verification Process Yes Completed: Patient Requires Transmission- No Based Precautions: Patient Has Alerts: Yes Patient Alerts: Patient on Blood Thinner Electronic Signature(s) Signed: 09/11/2019 8:56:47 AM By: Levan Hurst RN, BSN Entered By: Barrie Lyme History Since Last Visit Added or deleted any medications: No Any new allergies or adverse reactions: No Had a fall or experienced change in activities of daily living that may affect risk of falls: No Signs or symptoms of abuse/neglect since last visito No Hospitalized since last visit: No Implantable device outside of the clinic excluding cellular tissue based  products placed in the center since last visit: No Pain Present Now: No -------------------------------------------------------------------------------- Clinic Level of Care Assessment Details Patient Name: Date of Service: LIGAYA, CORMIER 09/02/2019 9:00 AM Medical Record [id number] Patient Account Number: [id number] Date of Birth/Sex: Treating RN: [birth year] (64 y.o. Krystal Little Primary Care [name]: Stark Klein Other Clinician: Referring [name]: Treating [name]/Extender:Robson, Kathee Delton, MAKIERA Weeks in Treatment: 0 Clinic Level of Care Assessment Items TOOL 1 Quantity Score X - Use when EandM and Procedure is performed on INITIAL visit 1 0 ASSESSMENTS - Nursing Assessment / Reassessment X - General Physical Exam (combine w/ comprehensive assessment (listed just below) 1 20 when performed on new pt. evals) X - Comprehensive Assessment (HX, ROS, Risk Assessments, Wounds Hx, etc.) 1 25 ASSESSMENTS - Wound and Skin Assessment / Reassessment []  - Dermatologic / Skin Assessment (not related to wound area) 0 ASSESSMENTS - Ostomy and/or Continence Assessment and Care []  - Incontinence Assessment and Management 0 []  - Ostomy Care Assessment and Management (repouching, etc.) 0 PROCESS - Coordination of Care X - Simple Patient / Family Education for ongoing care 1 15 []  - Complex (extensive) Patient / Family Education for ongoing care 0 X - Staff obtains Programmer, systems, Records, Test Results / Process Orders 1 10 []  - Staff telephones HHA, Nursing Homes / Clarify orders / etc 0 []  - Routine Transfer to another Facility (non-emergent condition) 0 []  - Routine Hospital Admission (non-emergent condition) 0 X - New Admissions / Biomedical engineer / Ordering NPWT, Apligraf, etc. 1 15 []  - Emergency Hospital Admission (emergent condition) 0 PROCESS - Special Needs []  - Pediatric / Minor Patient Management 0 []  - Isolation Patient Management 0 []  - Hearing /  Language / Visual special needs 0 []  - Assessment of Community assistance (transportation, D/C planning, etc.) 0 []  - Additional assistance / Altered mentation 0 []  - Support Surface(s) Assessment (bed, cushion, seat, etc.) 0 INTERVENTIONS - Miscellaneous []  - External ear exam 0 []  - Patient Transfer (multiple staff / Civil Service fast streamer / Similar devices) 0 []  -  Simple Staple / Suture removal (25 or less) 0 []  - Complex Staple / Suture removal (26 or more) 0 []  - Hypo/Hyperglycemic Management (do not check if billed separately) 0 X - Ankle / Brachial Index (ABI) - do not check if billed separately 1 15 Has the patient been seen at the hospital within the last three years: Yes Total Score: 100 Level Of Care: New/Established - Level 3 Electronic Signature(s) Signed: 09/03/2019 5:45:19 PM By: Carlene Coria RN Entered By: Carlene Coria on 09/02/2019 10:26:26 -------------------------------------------------------------------------------- Compression Therapy Details Patient Name: Date of Service: [name] 09/02/2019 9:00 AM Medical Record [id number] Patient Account Number: [id number] Date of Birth/Sex: Treating RN: 05/28/1956 (64 y.o. Krystal Little Primary Care Krystal Little: Stark Klein Other Clinician: Referring Lannette Avellino: Treating Janea Schwenn/Extender:Robson, Kathee Delton, MAKIERA Weeks in Treatment: 0 Compression Therapy Performed for Wound Wound #3 Right,Medial Lower Leg Assessment: Performed By: Clinician Carlene Coria, RN Compression Type: Three Layer Post Procedure Diagnosis Same as Pre-procedure Electronic Signature(s) Signed: 09/03/2019 5:45:19 PM By: Carlene Coria RN Entered By: Carlene Coria on 09/02/2019 10:51:36 -------------------------------------------------------------------------------- Encounter Discharge Information Details Patient Name: Date of Service: [name] 09/02/2019 9:00 AM Medical Record [id number] Patient Account Number:  [id number] Date of Birth/Sex: Treating RN: 1956/05/08 (64 y.o. [name] Primary Care [name]: Stark Klein Other Clinician: Referring Neyda Durango: Treating Jamiesha Victoria/Extender:Robson, Kathee Delton, MAKIERA Weeks in Treatment: 0 Encounter Discharge Information Items Post Procedure Vitals Discharge Condition: Stable Temperature (F): 98.2 Ambulatory Status: Ambulatory Pulse (bpm): 67 Discharge Destination: Home Respiratory Rate (breaths/min): 18 Transportation: Private Auto Blood Pressure (mmHg): 142/72 Accompanied By: self Schedule Follow-up Appointment: Yes Clinical Summary of Care: Patient Declined Electronic Signature(s) Signed: 09/02/2019 5:22:24 PM By: Kela Millin Entered By: Kela Millin on 09/02/2019 10:41:13 -------------------------------------------------------------------------------- Lower Extremity Assessment Details Patient Name: Date of Service: KUULEI, KLEIER 09/02/2019 9:00 AM Medical Record [id number] Patient Account Number: [id number] Date of Birth/Sex: Treating RN: [birth year] (64 y.o. Krystal Little Primary Care Katie Faraone: Stark Klein Other Clinician: Referring Paquita Printy: Treating Shambhavi Salley/Extender:Robson, Kathee Delton, MAKIERA Weeks in Treatment: 0 Edema Assessment Assessed: [Left: No] [Right: No] Edema: [Left: No] [Right: No] Calf Left: Right: Point of Measurement: 26 cm From Medial Instep cm 28.5 cm Ankle Left: Right: Point of Measurement: 11 cm From Medial Instep cm 20.2 cm Vascular Assessment Pulses: Dorsalis Pedis Palpable: [Right:Yes] Blood Pressure: Brachial: [Right:178] Ankle: [Right:Dorsalis Pedis: 142 0.80] Electronic Signature(s) Signed: 09/11/2019 8:56:47 AM By: Levan Hurst RN, BSN Entered By: Levan Hurst on 09/02/2019 09:43:03 -------------------------------------------------------------------------------- Multi Wound Chart Details Patient Name: Date of Service: [name]  09/02/2019 9:00 AM Medical Record [id number] Patient Account Number: [id number] Date of Birth/Sex: Treating RN: December 19, 1955 (64 y.o. Krystal Little Primary Care Lyanne Kates: Stark Klein Other Clinician: Referring Arlin Savona: Treating Sofia Jaquith/Extender:Robson, Kathee Delton, MAKIERA Weeks in Treatment: 0 Vital Signs Height(in): 64 Pulse(bpm): 66 Weight(lbs): 140 Blood Pressure(mmHg): 142/72 Body Mass Index(BMI): 24 Temperature(F): 98.2 Respiratory 18 Rate(breaths/min): Photos: [3:No Photos] [N/A:N/A] Wound Location: [3:Right, Medial Lower Leg] [N/A:N/A] Wounding Event: [3:Bump] [N/A:N/A] Primary Etiology: [3:Venous Leg Ulcer] [N/A:N/A] Comorbid History: [3:Arrhythmia, Deep Vein Thrombosis, Type II Diabetes] [N/A:N/A] Date Acquired: [3:05/18/2019] [N/A:N/A] Weeks of Treatment: [3:0] [N/A:N/A] Wound Status: [3:Open] [N/A:N/A] Clustered Wound: [3:Yes] [N/A:N/A] Clustered Quantity: [3:3] [N/A:N/A] Measurements L x W x D 4.2x2.5x0.1 [N/A:N/A] (cm) Area (cm) : [3:8.247] [N/A:N/A] Volume (cm) : [3:0.825] [N/A:N/A] % Reduction in Area: [3:0.00%] [N/A:N/A] % Reduction in Volume: 0.00% [N/A:N/A] Classification: [3:Full Thickness Without Exposed Support Structures] [N/A:N/A N/A] Exudate Amount: [3:Medium] [N/A:N/A N/A] Exudate Type: [3:Serosanguineous] [N/A:N/A N/A] Exudate  Color: [3:red, brown] [N/A:N/A N/A] Wound Margin: [3:Flat and Intact] [N/A:N/A N/A] Granulation Amount: [3:Medium (34-66%)] [N/A:N/A N/A] Granulation Quality: [3:Red, Pink] [N/A:N/A N/A] Necrotic Amount: [3:Medium (34-66%)] [N/A:N/A N/A] Exposed Structures: [3:Fat Layer (Subcutaneous N/A Tissue) Exposed: Yes Fascia: No Tendon: No Muscle: No Joint: No Bone: No] [N/A:N/A] Epithelialization: [3:None] [N/A:N/A N/A] Debridement: [3:Debridement - Excisional N/A] [N/A:N/A] Pre-procedure [3:10:21] [N/A:N/A N/A] Verification/Time Out Taken: Pain Control: [3:Other] [N/A:N/A N/A] Tissue Debrided:  [3:Subcutaneous, Slough] [N/A:N/A N/A] Level: [3:Skin/Subcutaneous Tissue] [N/A:N/A N/A] Debridement Area (sq cm):10.5 [N/A:N/A N/A] Instrument: [3:Curette] [N/A:N/A N/A] Bleeding: [3:Moderate] [N/A:N/A N/A] Hemostasis Achieved: [3:Pressure] [N/A:N/A N/A] Procedural Pain: [3:3] [N/A:N/A N/A] Post Procedural Pain: [3:0] [N/A:N/A N/A] Debridement Treatment Procedure was tolerated [N/A:N/A N/A] Response: [3:well] Post Debridement [3:4.2x2.5x0.1] [N/A:N/A N/A] Measurements L x W x D (cm) Post Debridement [3:0.825] [N/A:N/A N/A] Volume: (cm) Procedures Performed: Debridement [N/A:N/A N/A] Treatment Notes Wound #3 (Right, Medial Lower Leg) 1. Cleanse With Wound Cleanser 2. Periwound Care TCA Cream 3. Primary Dressing Applied Iodoflex 4. Secondary Dressing ABD Pad Dry Gauze 6. Support Layer Applied 3 layer compression wrap Notes netting Electronic Signature(s) Signed: 09/02/2019 5:18:57 PM By: Linton Ham MD Signed: 09/03/2019 5:45:19 PM By: Carlene Coria RN Entered By: Linton Ham on 09/02/2019 10:45:22 -------------------------------------------------------------------------------- Multi-Disciplinary Care Plan Details Patient Name: Date of Service: [name] 09/02/2019 9:00 AM Medical Record [id number] Patient Account Number: [id number] Date of Birth/Sex: Treating RN: 08-26-1955 (64 y.o. Krystal Little Primary Care Rodriguez Aguinaldo: Stark Klein Other Clinician: Referring Lakeysha Slutsky: Treating Raylie Maddison/Extender:Robson, Kathee Delton, MAKIERA Weeks in Treatment: 0 Active Inactive Wound/Skin Impairment Nursing Diagnoses: Knowledge deficit related to ulceration/compromised skin integrity Goals: Patient/caregiver will verbalize understanding of skin care regimen Date Initiated: 09/02/2019 Target Resolution Date: 09/30/2019 Goal Status: Active Ulcer/skin breakdown will have a volume reduction of 30% by week 4 Date Initiated: 09/02/2019 Target Resolution  Date: 09/30/2019 Goal Status: Active Interventions: Assess patient/caregiver ability to obtain necessary supplies Assess patient/caregiver ability to perform ulcer/skin care regimen upon admission and as needed Assess ulceration(s) every visit Notes: Electronic Signature(s) Signed: 09/03/2019 5:45:19 PM By: Carlene Coria RN Entered By: Carlene Coria on 09/02/2019 10:15:14 -------------------------------------------------------------------------------- Pain Assessment Details Patient Name: Date of Service: GURNOOR, SLOOP 09/02/2019 9:00 AM Medical Record [id number] Patient Account Number: [id number] Date of Birth/Sex: Treating RN: June 23, 1956 (64 y.o. Krystal Little Primary Care Kenli Waldo: Stark Klein Other Clinician: Referring Mohini Heathcock: Treating Hiilani Jetter/Extender:Robson, Kathee Delton, MAKIERA Weeks in Treatment: 0 Active Problems Location of Pain Severity and Description of Pain Patient Has Paino Yes Site Locations Pain Location: Pain in Ulcers With Dressing Change: Yes Duration of the Pain. Constant / Intermittento Intermittent Rate the pain. Current Pain Level: 1 Worst Pain Level: 7 Least Pain Level: 0 Character of Pain Describe the Pain: Burning Pain Management and Medication Current Pain Management: Medication: Yes Cold Application: No Rest: No Massage: No Activity: No T.E.N.S.: No Heat Application: No Leg drop or elevation: No Is the Current Pain Management Adequate: Adequate How does your wound impact your activities of daily livingo Sleep: No Bathing: No Appetite: No Relationship With Others: No Bladder Continence: No Emotions: No Bowel Continence: No Work: No Toileting: No Drive: No Dressing: No Hobbies: No Electronic Signature(s) Signed: 09/11/2019 8:56:47 AM By: Levan Hurst RN, BSN Entered By: Levan Hurst on 09/02/2019 09:44:37 -------------------------------------------------------------------------------- Patient/Caregiver  Education Details Patient Name: Date of Service: Chihuahua, Nethra B. 2/16/2021andnbsp9:00 AM Medical Record [id number] Patient Account Number: [id number] Date of Birth/Gender: Treating RN: 1955-08-11 (64 y.o. Krystal Little Primary Care Physician: Stark Klein Other Clinician: Referring  Physician: Treating Physician/Extender:Robson, Kathee Delton, MAKIERA Weeks in Treatment: 0 Education Assessment Education Provided To: Patient Education Topics Provided Wound/Skin Impairment: Methods: Explain/Verbal Responses: State content correctly Electronic Signature(s) Signed: 09/03/2019 5:45:19 PM By: Carlene Coria RN Entered By: Carlene Coria on 09/02/2019 10:15:23 -------------------------------------------------------------------------------- Wound Assessment Details Patient Name: Date of Service: SHANIGUA, GIBB 09/02/2019 9:00 AM Medical Record [id number] Patient Account Number: [id number] Date of Birth/Sex: Treating RN: February 11, 1956 (64 y.o. Krystal Little Primary Care Sidi Dzikowski: Stark Klein Other Clinician: Referring Kimley Apsey: Treating Elianny Buxbaum/Extender:Robson, Kathee Delton, MAKIERA Weeks in Treatment: 0 Wound Status Wound Number: 3 Primary Venous Leg Ulcer Etiology: Wound Location: Right Lower Leg - Medial Wound Open Wounding Event: Bump Status: Date Acquired: 05/18/2019 Comorbid Arrhythmia, Deep Vein Thrombosis, Type Weeks Of Treatment: 0 History: II Diabetes Clustered Wound: Yes Photos Wound Measurements Length: (cm) 4.2 % Reductio Width: (cm) 2.5 % Reductio Depth: (cm) 0.1 Epithelial Clustered Quantity: 3 Tunneling: Area: (cm) 8.247 Undermini Volume: (cm) 0.825 Wound Description Classification: Full Thickness Without Exposed Support Foul Od Structures Slough/ Wound Flat and Intact Margin: Exudate Medium Amount: Exudate Serosanguineous Type: Exudate red, brown Color: Wound Bed Granulation Amount: Medium (34-66%) Granulation  Quality: Red, Pink Fascia Necrotic Amount: Medium (34-66%) Fat Lay Necrotic Quality: Adherent Slough Tendon Muscle Joint E Bone Ex or After Cleansing: No Fibrino Yes Exposed Structure Exposed: No er (Subcutaneous Tissue) Exposed: Yes Exposed: No Exposed: No xposed: No posed: No n in Area: 0% n in Volume: 0% ization: None No ng: No Electronic Signature(s) Signed: 09/02/2019 4:25:10 PM By: Mikeal Hawthorne EMT/HBOT Signed: 09/11/2019 8:56:47 AM By: Levan Hurst RN, BSN Entered By: Mikeal Hawthorne on 09/02/2019 14:54:34 -------------------------------------------------------------------------------- [place] Details Patient Name: Date of Service: [name] 09/02/2019 9:00 AM Medical Record [id number] Patient Account Number: [id number] Date of Birth/Sex: Treating RN: 06/25/1956 (64 y.o. Krystal Little Primary Care Anik Wesch: Stark Klein Other Clinician: Referring Ylonda Storr: Treating Karolyn Messing/Extender:Robson, Kathee Delton, MAKIERA Weeks in Treatment: 0 Vital Signs Time Taken: 09:18 Temperature (F): 98.2 Height (in): 64 Pulse (bpm): 67 Source: Stated Respiratory Rate (breaths/min): 18 Weight (lbs): 140 Blood Pressure (mmHg): 142/72 Source: Stated Reference Range: 80 - 120 mg / dl Body Mass Index (BMI): 24 Electronic Signature(s) Signed: 09/11/2019 8:56:47 AM By: Levan Hurst RN, BSN Entered By: Levan Hurst on 09/02/2019 09:19:32

## 2019-09-11 NOTE — Progress Notes (Signed)
CYRENA, KUCHENBECKER ([id number]) Visit Report for 09/02/2019 Debridement Details Patient Name: Date of Service: Krystal Little, Krystal Little 09/02/2019 9:00 AM Medical Record [id number] Patient Account Number: [id number] Date of Birth/Sex: Treating RN: Sep 26, 1955 (64 y.o. [name] Primary Care Provider: Stark Klein Other Clinician: Referring Provider: Treating Provider/Extender:Girtrude Enslin, Kathee Delton, MAKIERA Weeks in Treatment: 0 Debridement Performed for Wound #3 Right,Medial Lower Leg Assessment: Performed By: Physician Ricard Dillon., MD Debridement Type: Debridement Severity of Tissue Pre Fat layer exposed Debridement: Level of Consciousness (Pre- Awake and Alert procedure): Pre-procedure Verification/Time Out Taken: Yes - 10:21 Start Time: 10:21 Pain Control: Other : benzocaine 20% Total Area Debrided (L x W): 4.2 (cm) x 2.5 (cm) = 10.5 (cm) Tissue and other material Viable, Non-Viable, Slough, Subcutaneous, Skin: Dermis , Skin: Epidermis, Slough debrided: Level: Skin/Subcutaneous Tissue Debridement Description: Excisional Instrument: Curette Bleeding: Moderate Hemostasis Achieved: Pressure End Time: 10:23 Procedural Pain: 3 Post Procedural Pain: 0 Response to Treatment: Procedure was tolerated well Level of Consciousness Awake and Alert (Post-procedure): Post Debridement Measurements of Total Wound Length: (cm) 4.2 Width: (cm) 2.5 Depth: (cm) 0.1 Volume: (cm) 0.825 Character of Wound/Ulcer Post Improved Debridement: Severity of Tissue Post Debridement: Fat layer exposed Post Procedure Diagnosis Same as Pre-procedure Electronic Signature(s) Signed: 09/02/2019 5:18:57 PM By: Linton Ham MD Signed: 09/03/2019 5:45:19 PM By: Carlene Coria RN Entered By: Linton Ham on 09/02/2019 10:45:33 -------------------------------------------------------------------------------- HPI Details Patient Name: Date of Service: Krystal Little 09/02/2019  9:00 AM Medical Record [id number] Patient Account Number: [id number] Date of Birth/Sex: Treating RN: [birth year] (64 y.o. [name] Primary Care Provider: Stark Klein Other Clinician: Referring Provider: Treating Provider/Extender:Lejuan Botto, Kathee Delton, MAKIERA Weeks in Treatment: 0 History of Present Illness HPI Description: ADMISSION 09/02/2019 This is a 64 year old woman who is a poorly controlled type II diabetic with a recent hemoglobin A1c of 12.2. She tells [place] that she developed a wound on her right medial calf sometime in November. She is not exactly sure how this happened. She wonders if she got bit by something. She went to the ER at the end of December and they felt this was an abscess. They gave her doxycycline they did not culture this and she used topical Neosporin. At that point it was apparently draining. On 08/18/2019 she saw her primary physician at family practice. Correctly noted that this was probably venous insufficiency but listed in the differential for an inflammatory ulcer of uncertain etiology and wondered about a biopsy. They gave her an additional 10 days of doxycycline and she has been using triple antibiotics. She says the wound actually has been getting larger. Past medical history includes type 2 diabetes uncontrolled, chronic atrial fibrillation on Coumadin, status post mitral valve replacement, pacemaker, hyperlipidemia and hypertension. ABIs in our clinic were 0.80 on the right however the patient's pulses in her foot at both the dorsalis pedis and posterior tibial are vibrant Electronic Signature(s) Signed: 09/02/2019 5:18:57 PM By: Linton Ham MD Entered By: Linton Ham on 09/02/2019 10:48:08 -------------------------------------------------------------------------------- Physical Exam Details Patient Name: Date of Service: Krystal Little 09/02/2019 9:00 AM Medical Record [id number] Patient Account Number:  [id number] Date of Birth/Sex: Treating RN: [birth year] (64 y.o. [name] Primary Care Provider: Stark Klein Other Clinician: Referring Provider: Treating Provider/Extender:Ryott Rafferty, Kathee Delton, MAKIERA Weeks in Treatment: 0 Constitutional Patient is hypertensive.. Pulse regular and within target range for patient.Marland Kitchen Respirations regular, non-labored and within target range.. Temperature is normal and within the target range for the patient.Marland Kitchen Appears in no distress. Eyes Conjunctivae clear.  No discharge.no icterus. Respiratory Above normal respiratory effort noted. Respiratory rate elveaated. Cardiovascular Pedal pulses palpable normal on the right. Integumentary (Hair, Skin) There is erythema around the wound but no tenderness. On the left leg much darker skin in the lower half of her leg than the rest of her leg this looks like hemosiderin deposition. Psychiatric appears at normal baseline. Notes Wound exam; the area questions on the right medial lower extremity. The wound areas are almost geographically laid out. Superficial. There is surrounding inflammation but no real tendernesso Stasis dermatitis. She does not have a lot of edema. There is little reason at the bedside to think that her arterial status is playing a primary role here. Electronic Signature(s) Signed: 09/02/2019 5:18:57 PM By: Linton Ham MD Entered By: Linton Ham on 09/02/2019 10:50:35 -------------------------------------------------------------------------------- Physician Orders Details Patient Name: Date of Service: Krystal Little 09/02/2019 9:00 AM Medical Record [id number] Patient Account Number: [id number] Date of Birth/Sex: Treating RN: 07-Jan-1956 (64 y.o. [name] Primary Care Provider: Stark Klein Other Clinician: Referring Provider: Treating Provider/Extender:Elisheva Fallas, Kathee Delton, MAKIERA Weeks in Treatment: 0 Verbal / Phone Orders: No Diagnosis  Coding Follow-up Appointments Return Appointment in 1 week. Dressing Change Frequency Do not change entire dressing for one week. Skin Barriers/Peri-Wound Care TCA Cream or Ointment Wound Cleansing May shower with protection. Primary Wound Dressing Wound #3 Right,Medial Lower Leg Iodoflex Secondary Dressing Wound #3 Right,Medial Lower Leg Dry Gauze ABD pad Edema Control Wound #3 Right,Medial Lower Leg 3 Layer Compression System - Right Lower Extremity Services and Therapies Venous Reflux Studies -Bilateral - non healing wounds right lower leg Electronic Signature(s) Signed: 09/02/2019 5:18:57 PM By: Linton Ham MD Signed: 09/03/2019 5:45:19 PM By: Carlene Coria RN Entered By: Carlene Coria on 09/02/2019 10:25:45 -------------------------------------------------------------------------------- Prescription 09/02/2019 Patient Name: [name]. Provider: Linton Ham MD Date of Birth: August 13, 1955 NPI#: [id number] Sex: F DEA#: [id number] Phone #: [phone] License #: [id number] Patient Address: [address], [place] [address] [place], [place] [address] [phone] Allergies No Known Drug Allergies Provider's Orders Venous Reflux Studies -Bilateral - non healing wounds right lower leg Signature(s): Date(s): Electronic Signature(s) Signed: 09/02/2019 5:18:57 PM By: Linton Ham MD Signed: 09/03/2019 5:45:19 PM By: Carlene Coria RN Entered By: Carlene Coria on 09/02/2019 10:25:45 --------------------------------------------------------------------------------  Problem List Details Patient Name: Date of Service: Krystal Little 09/02/2019 9:00 AM Medical Record [id number] Patient Account Number: [id number] Date of Birth/Sex: Treating RN: October 21, 1955 (64 y.o. [name] Primary Care Provider: Stark Klein Other Clinician: Referring Provider: Treating  Provider/Extender:Cristopher Ciccarelli, Kathee Delton, MAKIERA Weeks in Treatment: 0 Active Problems ICD-10 Evaluated Encounter Code Description Active Date Today Diagnosis L97.212 Non-pressure chronic ulcer of right calf with fat layer 09/02/2019 No Yes exposed I87.332 Chronic venous hypertension (idiopathic) with ulcer 09/02/2019 No Yes and inflammation of left lower extremity E11.622 Type 2 diabetes mellitus with other skin ulcer 09/02/2019 No Yes Inactive Problems Resolved Problems Electronic Signature(s) Signed: 09/02/2019 5:18:57 PM By: Linton Ham MD Entered By: Linton Ham on 09/02/2019 10:45:15 -------------------------------------------------------------------------------- Progress Note Details Patient Name: Date of Service: Krystal Little 09/02/2019 9:00 AM Medical Record [id number] Patient Account Number: [id number] Date of Birth/Sex: Treating RN: 03/02/1956 (64 y.o. [name] Primary Care Provider: Stark Klein Other Clinician: Referring Provider: Treating Provider/Extender:Heavyn Yearsley, Kathee Delton, MAKIERA Weeks in Treatment: 0 Subjective History of Present Illness (HPI) ADMISSION 09/02/2019 This is a 64 year old woman who is a poorly controlled type II diabetic  with a recent hemoglobin A1c of 12.2. She tells [place] that she developed a wound on her right medial calf sometime in November. She is not exactly sure how this happened. She wonders if she got bit by something. She went to the ER at the end of December and they felt this was an abscess. They gave her doxycycline they did not culture this and she used topical Neosporin. At that point it was apparently draining. On 08/18/2019 she saw her primary physician at family practice. Correctly noted that this was probably venous insufficiency but listed in the differential for an inflammatory ulcer of uncertain etiology and wondered about a biopsy. They gave her an additional 10 days of doxycycline and she has  been using triple antibiotics. She says the wound actually has been getting larger. Past medical history includes type 2 diabetes uncontrolled, chronic atrial fibrillation on Coumadin, status post mitral valve replacement, pacemaker, hyperlipidemia and hypertension. ABIs in our clinic were 0.80 on the right however the patient's pulses in her foot at both the dorsalis pedis and posterior tibial are vibrant Patient History Information obtained from Patient. Allergies No Known Drug Allergies Family History Cancer - Siblings, Diabetes - Siblings, Hypertension - Mother, Kidney Disease - Siblings, No family history of Heart Disease, Hereditary Spherocytosis, Lung Disease, Seizures, Stroke, Thyroid Problems, Tuberculosis. Social History Never smoker, Marital Status - Married, Alcohol Use - Rarely, Drug Use - No History, Caffeine Use - Rarely. Medical History Cardiovascular Patient has history of Arrhythmia - A-Fib, Deep Vein Thrombosis Endocrine Patient has history of Type II Diabetes Patient is treated with Oral Agents. Blood sugar is not tested. Medical And Surgical History Notes Cardiovascular Mitral valve regurgitation, Mitral Valve Repair, Pacemaker, Hyperlipidemia Review of Systems (ROS) Constitutional Symptoms ([facility]) Denies complaints or symptoms of Fatigue, Fever, Chills, Marked Weight Change. Eyes Complains or has symptoms of Glasses / Contacts - glasses. Ear/Nose/Mouth/Throat Denies complaints or symptoms of Chronic sinus problems or rhinitis. Respiratory Denies complaints or symptoms of Chronic or frequent coughs, Shortness of Breath. Gastrointestinal Denies complaints or symptoms of Frequent diarrhea, Nausea, Vomiting. Genitourinary Denies complaints or symptoms of Frequent urination. Integumentary (Skin) Complains or has symptoms of Wounds - wound on right lower leg. Musculoskeletal Denies complaints or symptoms of Muscle Pain, Muscle  Weakness. Neurologic Denies complaints or symptoms of Numbness/parasthesias. Psychiatric Denies complaints or symptoms of Claustrophobia, Suicidal. Objective Constitutional Patient is hypertensive.. Pulse regular and within target range for patient.Marland Kitchen Respirations regular, non-labored and within target range.. Temperature is normal and within the target range for the patient.Marland Kitchen Appears in no distress. Vitals Time Taken: 9:18 AM, Height: 64 in, Source: Stated, Weight: 140 lbs, Source: Stated, BMI: 24, Temperature: 98.2 F, Pulse: 67 bpm, Respiratory Rate: 18 breaths/min, Blood Pressure: 142/72 mmHg. Eyes Conjunctivae clear. No discharge.no icterus. Respiratory Above normal respiratory effort noted. Respiratory rate elveaated. Cardiovascular Pedal pulses palpable normal on the right. Psychiatric appears at normal baseline. Little Notes: Wound exam; the area questions on the right medial lower extremity. The wound areas are almost geographically laid out. Superficial. There is surrounding inflammation but no real tendernesso Stasis dermatitis. She does not have a lot of edema. There is little reason at the bedside to think that her arterial status is playing a primary role here. Integumentary (Hair, Skin) There is erythema around the wound but no tenderness. On the left leg much darker skin in the lower half of her leg than the rest of her leg this looks like hemosiderin deposition. Wound #3 status is  Open. Original cause of wound was Bump. The wound is located on the Right,Medial Lower Leg. The wound measures 4.2cm length x 2.5cm width x 0.1cm depth; 8.247cm^2 area and 0.825cm^3 volume. There is Fat Layer (Subcutaneous Tissue) Exposed exposed. There is no tunneling or undermining noted. There is a medium amount of serosanguineous drainage noted. The wound margin is flat and intact. There is medium (34-66%) red, pink granulation within the wound bed. There is a medium (34-66%) amount of  necrotic tissue within the wound bed including Adherent Slough. 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 There was a Excisional Skin/Subcutaneous Tissue Debridement with a total area of 10.5 sq cm performed by Ricard Dillon., MD. With the following instrument(s): Curette to remove Viable and Non-Viable tissue/material. Material removed includes Subcutaneous Tissue, Slough, Skin: Dermis, and Skin: Epidermis after achieving pain control using Other (benzocaine 20%). No specimens were taken. A time out was conducted at 10:21, prior to the start of the procedure. A Moderate amount of bleeding was controlled with Pressure. The procedure was tolerated well with a pain level of 3 throughout and a pain level of 0 following the procedure. Post Debridement Measurements: 4.2cm length x 2.5cm width x 0.1cm depth; 0.825cm^3 volume. Character of Wound/Ulcer Post Debridement is improved. Severity of Tissue Post Debridement is: Fat layer exposed. Post procedure Diagnosis Wound #3: Same as Pre-Procedure Plan Follow-up Appointments: Return Appointment in 1 week. Dressing Change Frequency: Do not change entire dressing for one week. Skin Barriers/Peri-Wound Care: TCA Cream or Ointment Wound Cleansing: May shower with protection. Primary Wound Dressing: Wound #3 Right,Medial Lower Leg: Iodoflex Secondary Dressing: Wound #3 Right,Medial Lower Leg: Dry Gauze ABD pad Edema Control: Wound #3 Right,Medial Lower Leg: 3 Layer Compression System - Right Lower Extremity Services and Therapies ordered were: Venous Reflux Studies  -Bilateral - non healing wounds right lower leg 1. I agree with the somewhat unusual appearance of this wound although I think it is probably venous insufficiency with surrounding stasis dermatitis is the cause of this. I base this largely on the skin discoloration in her lower extremity on the left. I do not disagree with the thought about biopsying this if this does not respond to usual treatment for a chronic venous ulcer. The thought above pyoderma gangrenosum, other types of inflammatory ulcers certainly is thoughtful however usually these are a lot more painful than this ulcer appears to be. I do not currently believe that this is a usual bacterial infection. 2. As we keep on insulin chronic venous insufficiency we are going to order venous reflux studies. 3; the wound area was debrided of fibrinous adherent material. Hemostasis with direct pressure I am able to get the reasonably healthy looking tissue. I am going to continue the debridement process with Iodoflex under 3 layer compression 4. If this does not respond to this type of treatment then I will reapproach the biopsy issue with her. She is on Coumadin which is always a concern however I think we could probably get her through this. I would like to give her 3 or 4 weeks to gauge this unless of course she gets larger 5. I do not believe that her arterial status  is behind any of this 6. I emphasized better control of her diabetes and is unsure her primary doctors are concerned about this as well I spent 35 minutes in the review of this patient's record, face-to-face evaluation and preparation of this record Electronic Signature(s) Signed: 09/02/2019 5:18:57 PM By: Linton Ham MD Entered By: Linton Ham on 09/02/2019 10:53:59 -------------------------------------------------------------------------------- HxROS Details Patient Name: Date of Service: Krystal Little 09/02/2019 9:00 AM Medical Record [id number]  Patient Account Number: [id number] Date of Birth/Sex: Treating RN: 03/05/1956 (64 y.o. [name] Primary Care Provider: Stark Klein Other Clinician: Referring Provider: Treating Provider/Extender:Witt Plitt, Kathee Delton, MAKIERA Weeks in Treatment: 0 Information Obtained From Patient Constitutional Symptoms ([facility]) Complaints and Symptoms: Negative for: Fatigue; Fever; Chills; Marked Weight Change Eyes Complaints and Symptoms: Positive for: Glasses / Contacts - glasses Ear/Nose/Mouth/Throat Complaints and Symptoms: Negative for: Chronic sinus problems or rhinitis Respiratory Complaints and Symptoms: Negative for: Chronic or frequent coughs; Shortness of Breath Gastrointestinal Complaints and Symptoms: Negative for: Frequent diarrhea; Nausea; Vomiting Genitourinary Complaints and Symptoms: Negative for: Frequent urination Integumentary (Skin) Complaints and Symptoms: Positive for: Wounds - wound on right lower leg Musculoskeletal Complaints and Symptoms: Negative for: Muscle Pain; Muscle Weakness Neurologic Complaints and Symptoms: Negative for: Numbness/parasthesias Psychiatric Complaints and Symptoms: Negative for: Claustrophobia; Suicidal Hematologic/Lymphatic Cardiovascular Medical History: Positive for: Arrhythmia - A-Fib; Deep Vein Thrombosis Past Medical History Notes: Mitral valve regurgitation, Mitral Valve Repair, Pacemaker, Hyperlipidemia Endocrine Medical History: Positive for: Type II Diabetes Time with diabetes: 15 years Treated with: Oral agents Blood sugar tested every day: No Immunological Oncologic Immunizations Pneumococcal Vaccine: Received Pneumococcal Vaccination: Yes Implantable Devices Yes Family and Social History Cancer: Yes - Siblings; Diabetes: Yes - Siblings; Heart Disease: No; Hereditary Spherocytosis: No; Hypertension: Yes - Mother; Kidney Disease: Yes - Siblings; Lung Disease: No; Seizures: No; Stroke: No;  Thyroid Problems: No; Tuberculosis: No; Never smoker; Marital Status - Married; Alcohol Use: Rarely; Drug Use: No History; Caffeine Use: Rarely; Financial Concerns: No; Food, Clothing or Shelter Needs: No; Support System Lacking: No; Transportation Concerns: No Electronic Signature(s) Signed: 09/02/2019 5:18:57 PM By: Linton Ham MD Signed: 09/11/2019 8:56:47 AM By: Levan Hurst RN, BSN Entered By: Levan Hurst on 09/02/2019 09:32:46 -------------------------------------------------------------------------------- [place] Details Patient Name: Date of Service: Krystal Little 09/02/2019 Medical Record [id number] Patient Account Number: [id number] Date of Birth/Sex: Treating RN: 13-May-1956 (64 y.o. [name] Primary Care Provider: Stark Klein Other Clinician: Referring Provider: Treating Provider/Extender:Tamberlyn Midgley, Kathee Delton, MAKIERA Weeks in Treatment: 0 Diagnosis Coding ICD-10 Codes Code Description [id number] Non-pressure chronic ulcer of right calf with fat layer exposed I87.332 Chronic venous hypertension (idiopathic) with ulcer and inflammation of left lower extremity E11.622 Type 2 diabetes mellitus with other skin ulcer Facility Procedures CPT4 Code Description: [id number] 99213 - WOUND CARE VISIT-LEV 3 EST PT Modifier: 25 Quantity: 1 CPT4 Code Description: [id number] 11042 - DEB SUBQ TISSUE 20 SQ CM/< ICD-10 Diagnosis Description L97.212 Non-pressure chronic ulcer of right calf with fat layer e Modifier: xposed Quantity: 1 Physician Procedures CPT4 Code Description: [id number] WC PHYS LEVEL 3 NEW PT ICD-10 Diagnosis Description L97.212 Non-pressure chronic ulcer of right calf with fat layer expo I87.332 Chronic venous hypertension (idiopathic) with ulcer and infl extremity E11.622 Type 2 diabetes  mellitus with other skin ulcer Modifier: 25 sed ammation of left Quantity: 1 lower CPT4 Code Description: [id number] 11042 - WC PHYS SUBQ TISS 20 SQ CM ICD-10  Diagnosis Description L97.212 Non-pressure chronic ulcer of right calf with fat layer expo Modifier: sed Quantity:  1 Electronic Signature(s) Signed: 09/02/2019 5:18:57 PM By: Linton Ham MD Signed: 09/03/2019 5:45:19 PM By: Carlene Coria RN Entered By: Carlene Coria on 09/02/2019 12:24:03

## 2019-09-11 NOTE — Progress Notes (Signed)
CHARLOTTA, LAPAGLIA ([id number]) Visit Report for 09/02/2019 Abuse/Suicide Risk Screen Details Patient Name: Date of Service: DEVONDA, PEQUIGNOT 09/02/2019 9:00 AM Medical Record [id number] Patient Account Number: [id number] Date of Birth/Sex: Treating RN: 1956-01-19 (64 y.o. [name] Primary Care Calysta Craigo: Stark Klein Other Clinician: Referring Masson Nalepa: Treating Genecis Veley/Extender:Robson, Kathee Delton, MAKIERA Weeks in Treatment: 0 Abuse/Suicide Risk Screen Items Answer ABUSE RISK SCREEN: Has anyone close to you tried to hurt or harm you recentlyo No Do you feel uncomfortable with anyone in your familyo No Has anyone forced you do things that you didnt want to doo No Electronic Signature(s) Signed: 09/11/2019 8:56:47 AM By: Levan Hurst RN, BSN Entered By: Levan Hurst on 09/02/2019 09:32:52 -------------------------------------------------------------------------------- Activities of Daily Living Details Patient Name: Date of Service: LOREEN, BANKSON 09/02/2019 9:00 AM Medical Record [id number] Patient Account Number: [id number] Date of Birth/Sex: Treating RN: 06/22/1956 (64 y.o. [name] Primary Care Linnae Rasool: Stark Klein Other Clinician: Referring Adrien Dietzman: Treating Jayliani Wanner/Extender:Robson, Kathee Delton, MAKIERA Weeks in Treatment: 0 Activities of Daily Living Items Answer Activities of Daily Living (Please select one for each item) Drive Automobile Completely Able Take Medications Completely Able Use Telephone Completely Able Care for Appearance Completely Able Use Toilet Completely Able Bath / Shower Completely Able Dress Self Completely Able Feed Self Completely Able Walk Completely Able Get In / Out Bed Completely Able Housework Completely Able Prepare Meals Completely [place] Completely Able Shop for Self Completely Able Electronic Signature(s) Signed: 09/11/2019 8:56:47 AM By: Levan Hurst RN,  BSN Entered By: Levan Hurst on 09/02/2019 09:33:12 -------------------------------------------------------------------------------- Education Screening Details Patient Name: Date of Service: [name] 09/02/2019 9:00 AM Medical Record [id number] Patient Account Number: [id number] Date of Birth/Sex: Treating RN: [birth year] (64 y.o. [name] Primary Care Yair Dusza: Stark Klein Other Clinician: Referring Jairen Goldfarb: Treating Daundre Biel/Extender:Robson, Kathee Delton, MAKIERA Weeks in Treatment: 0 Primary Learner Assessed: Patient Learning Preferences/Education Level/Primary Language Learning Preference: Explanation, Demonstration, Printed Material Highest Education Level: College or Above Preferred Language: English Cognitive Barrier Language Barrier: No Translator Needed: No Memory Deficit: No Emotional Barrier: No Cultural/Religious Beliefs Affecting Medical Care: No Physical Barrier Impaired Vision: No Impaired Hearing: No Decreased Hand dexterity: No Knowledge/Comprehension Knowledge Level: High Comprehension Level: High Ability to understand written High instructions: Ability to understand verbal High instructions: Motivation Anxiety Level: Calm Cooperation: Cooperative Education Importance: Acknowledges Need Interest in Health Problems: Asks Questions Perception: Coherent Willingness to Engage in Self- High Management Activities: Readiness to Engage in Self- High Management Activities: Electronic Signature(s) Signed: 09/11/2019 8:56:47 AM By: Levan Hurst RN, BSN Entered By: Levan Hurst on 09/02/2019 09:33:29 -------------------------------------------------------------------------------- Fall Risk Assessment Details Patient Name: Date of Service: [name] 09/02/2019 9:00 AM Medical Record [id number] Patient Account Number: [id number] Date of Birth/Sex: Treating RN: [birth year] (64 y.o. [name] Primary Care Markita Stcharles: Stark Klein Other Clinician: Referring Miabella Shannahan: Treating Rhyli Depaula/Extender:Robson, Kathee Delton, MAKIERA Weeks in Treatment: 0 Fall Risk Assessment Items Have you had 2 or more falls in the last 12 monthso 0 No Have you had any fall that resulted in injury in the last 12 monthso 0 No FALLS RISK SCREEN History of falling - immediate or within 3 months 0 No Secondary diagnosis (Do you have 2 or more medical diagnoseso) 15 Yes Ambulatory aid None/bed rest/wheelchair/nurse 0 Yes Crutches/cane/walker 0 No Furniture 0 No Intravenous therapy Access/Saline/Heparin Lock 0 No Weak (short steps with or without shuffle, stooped but able to lift head 0 No while walking, may seek support from furniture) Impaired (  short steps with shuffle, may have difficulty arising from chair, 0 No head down, impaired balance) Mental Status Oriented to own ability 0 Yes Overestimates or forgets limitations 0 No Risk Level: Low Risk Score: 15 Electronic Signature(s) Signed: 09/11/2019 8:56:47 AM By: Levan Hurst RN, BSN Entered By: Levan Hurst on 09/02/2019 09:33:46 -------------------------------------------------------------------------------- Foot Assessment Details Patient Name: Date of Service: [name] 09/02/2019 9:00 AM Medical Record [id number] Patient Account Number: [id number] Date of Birth/Sex: Treating RN: 1956/04/29 (64 y.o. [name] Primary Care Lesleyanne Politte: Stark Klein Other Clinician: Referring Anakaren Campion: Treating Bode Pieper/Extender:Robson, Kathee Delton, MAKIERA Weeks in Treatment: 0 Foot Assessment Items Site Locations + = Sensation present, - = Sensation absent, C = Callus, U = Ulcer R = Redness, W = Warmth, M = Maceration, PU = Pre-ulcerative lesion F = Fissure, S = Swelling, D = Dryness Assessment Right: Left: Other Deformity: No No Prior Foot Ulcer: No No Prior Amputation: No No Charcot Joint: No  No Ambulatory Status: Ambulatory Without Help Gait: Steady Electronic Signature(s) Signed: 09/11/2019 8:56:47 AM By: Levan Hurst RN, BSN Entered By: Levan Hurst on 09/02/2019 09:35:30 -------------------------------------------------------------------------------- Nutrition Risk Screening Details Patient Name: Date of Service: [name] 09/02/2019 9:00 AM Medical Record [id number] Patient Account Number: [id number] Date of Birth/Sex: Treating RN: 12/09/1955 (64 y.o. [name] Primary Care Dorine Duffey: Stark Klein Other Clinician: Referring Pinchos Topel: Treating Leilanie [name]/Extender:Robson, Kathee Delton, MAKIERA Weeks in Treatment: 0 Height (in): 64 Weight (lbs): 140 Body Mass Index (BMI): 24 Nutrition Risk Screening Items Score Screening NUTRITION RISK SCREEN: I have an illness or condition that made me change the kind and/or 2 Yes amount of food I eat I eat fewer than two meals per day 0 No I eat few fruits and vegetables, or milk products 0 No I have three or more drinks of beer, liquor or wine almost every day 0 No I have tooth or mouth problems that make it hard for me to eat 0 No I don't always have enough money to buy the food I need 0 No I eat alone most of the time 0 No I take three or more different prescribed or over-the-counter drugs a day 1 Yes 0 No Without wanting to, I have lost or gained 10 pounds in the last six months I am not always physically able to shop, cook and/or feed myself 0 No Nutrition Protocols Good Risk Protocol Provide education on elevated blood sugars and Moderate Risk Protocol 0 impact on wound healing, as applicable High Risk Proctocol Risk Level: Moderate Risk Score: 3 Electronic Signature(s) Signed: 09/11/2019 8:56:47 AM By: Levan Hurst RN, BSN Entered By: Levan Hurst on 09/02/2019 09:33:59

## 2019-09-11 NOTE — Telephone Encounter (Signed)
09/11/19 12:04 LEFT ANOTHER VM FOR PATIENT TO CALL AND SCHEDULE APPT/ I DID CALL AND SPEAK W/JEANIE @ DR Janalyn Rouse OFFICE SHE IS GOING TO REACH OUT AND HAVE PATIENT GIVE [place] A CALL TO MAKE APPT/[facility]  09/10/19 11:19AM LVM FOR PT TO CALL AND SCHEDULE APPT/[facility]   09/02/19 9:54AM LVM FOR PT TO CALL AND SCHEDULE APPT/REFERRAL FROM DR ROBSON/[facility]

## 2019-09-15 ENCOUNTER — Other Ambulatory Visit (HOSPITAL_COMMUNITY): Payer: Self-pay | Admitting: Internal Medicine

## 2019-09-15 DIAGNOSIS — R600 Localized edema: Secondary | ICD-10-CM

## 2019-09-16 ENCOUNTER — Encounter (HOSPITAL_BASED_OUTPATIENT_CLINIC_OR_DEPARTMENT_OTHER): Payer: Medicare Other | Attending: Internal Medicine | Admitting: Internal Medicine

## 2019-09-16 ENCOUNTER — Ambulatory Visit (HOSPITAL_COMMUNITY)
Admission: RE | Admit: 2019-09-16 | Discharge: 2019-09-16 | Disposition: A | Discharge status: Home / Self Care | Payer: Medicare Other | Source: Ambulatory Visit | Attending: Vascular Surgery | Admitting: Vascular Surgery

## 2019-09-16 ENCOUNTER — Other Ambulatory Visit: Payer: Self-pay

## 2019-09-16 DIAGNOSIS — L97212 Non-pressure chronic ulcer of right calf with fat layer exposed: Secondary | ICD-10-CM | POA: Diagnosis not present

## 2019-09-16 DIAGNOSIS — I10 Essential (primary) hypertension: Secondary | ICD-10-CM | POA: Insufficient documentation

## 2019-09-16 DIAGNOSIS — R600 Localized edema: Secondary | ICD-10-CM

## 2019-09-16 DIAGNOSIS — Z952 Presence of prosthetic heart valve: Secondary | ICD-10-CM | POA: Insufficient documentation

## 2019-09-16 DIAGNOSIS — E785 Hyperlipidemia, unspecified: Secondary | ICD-10-CM | POA: Diagnosis not present

## 2019-09-16 DIAGNOSIS — E11622 Type 2 diabetes mellitus with other skin ulcer: Secondary | ICD-10-CM | POA: Diagnosis not present

## 2019-09-16 DIAGNOSIS — I482 Chronic atrial fibrillation, unspecified: Secondary | ICD-10-CM | POA: Insufficient documentation

## 2019-09-16 DIAGNOSIS — Z950 Presence of cardiac pacemaker: Secondary | ICD-10-CM | POA: Insufficient documentation

## 2019-09-16 DIAGNOSIS — I87331 Chronic venous hypertension (idiopathic) with ulcer and inflammation of right lower extremity: Secondary | ICD-10-CM | POA: Diagnosis not present

## 2019-09-16 DIAGNOSIS — Z86718 Personal history of other venous thrombosis and embolism: Secondary | ICD-10-CM | POA: Diagnosis not present

## 2019-09-16 DIAGNOSIS — Z7901 Long term (current) use of anticoagulants: Secondary | ICD-10-CM | POA: Diagnosis not present

## 2019-09-17 NOTE — Progress Notes (Signed)
NGAN, QUALLS ([id number]) Visit Report for 09/16/2019 HPI Details Patient Name: Date of Service: Krystal Little, Krystal Little 09/16/2019 3:45 PM Medical Record [id number] Patient Account Number: [id number] Date of Birth/Sex: Treating RN: [birth year] (64 y.o. [name] Primary Care Provider: Stark Klein Other Clinician: Referring Provider: Treating Provider/Extender:Tabor Bartram, Kathee Delton, MAKIERA Weeks in Treatment: 2 History of Present Illness HPI Description: ADMISSION 09/02/2019 This is a 64 year old woman who is a poorly controlled type II diabetic with a recent hemoglobin A1c of 12.2. She tells [place] that she developed a wound on her right medial calf sometime in November. She is not exactly sure how this happened. She wonders if she got bit by something. She went to the ER at the end of December and they felt this was an abscess. They gave her doxycycline they did not culture this and she used topical Neosporin. At that point it was apparently draining. On 08/18/2019 she saw her primary physician at family practice. Correctly noted that this was probably venous insufficiency but listed in the differential for an inflammatory ulcer of uncertain etiology and wondered about a biopsy. They gave her an additional 10 days of doxycycline and she has been using triple antibiotics. She says the wound actually has been getting larger. Past medical history includes type 2 diabetes uncontrolled, chronic atrial fibrillation on Coumadin, status post mitral valve replacement, pacemaker, hyperlipidemia and hypertension. ABIs in our clinic were 0.80 on the right however the patient's pulses in her foot at both the dorsalis pedis and posterior tibial are vibrant 2/23; patient wound actually looks somewhat better. Still several open areas. Debris on the surface. We have been using Iodoflex under compression. 3/2; patient has an odd wound on the medial part of her mid calf. Some surrounding  inflammation which could be venous inflammation. The wound is larger and deeper today. The patient had a reflux studies. She has greater saphenous vein reflux at the saphenofemoral junction as well as small saphenous vein reflux in the proximal calf and mid calf. There is no evidence of a DVT from the common femoral to the popliteal veins. I have referred her to vascular surgery for review of the reflux vis--vis the wound area on the left medial calf As the wound did not progress on a trajectory I went back and discussed biopsying this area with the patient which was one of the reasons her primary doctor sent her here. I told her the issue here would did make sure this is not an atypical ulcer either malignant or other types of inflammatory ulcers. In any case she refused. I told her that we would bring this up again next week. Electronic Signature(s) Signed: 09/16/2019 5:14:23 PM By: Linton Ham MD Entered By: Linton Ham on 09/16/2019 16:58:04 -------------------------------------------------------------------------------- Physical Exam Details Patient Name: Date of Service: Krystal Little 09/16/2019 3:45 PM Medical Record [id number] Patient Account Number: [id number] Date of Birth/Sex: Treating RN: 1956-03-25 (64 y.o. [name] Primary Care Provider: Stark Klein Other Clinician: Referring Provider: Treating Provider/Extender:Mohammed Mcandrew, Kathee Delton, MAKIERA Weeks in Treatment: 2 Constitutional Patient is hypertensive.. Pulse regular and within target range for patient.Marland Kitchen Respirations regular, non-labored and within target range.. Temperature is normal and within the target range for the patient.Marland Kitchen Appears in no distress. Cardiovascular Pedal pulses are palpable. Integumentary (Hair, Skin) There is some erythema around the wound/inflammation but no tenderness to suggest infection. Notes Wound exam; the wound surface does not look that bad but it has depth  there is undermining and it seems to  have gone under normal skin with open areas developing here especially laterally. The whole appearance of it is atypical. There is surrounding inflammation but no tenderness I do not believe this is an infection issue Electronic Signature(s) Signed: 09/16/2019 5:14:23 PM By: Linton Ham MD Entered By: Linton Ham on 09/16/2019 17:02:40 -------------------------------------------------------------------------------- Physician Orders Details Patient Name: Date of Service: [name] 09/16/2019 3:45 PM Medical Record [id number] Patient Account Number: [id number] Date of Birth/Sex: Treating RN: 20-Sep-1955 (64 y.o. [name] Primary Care Provider: Stark Klein Other Clinician: Referring Provider: Treating Provider/Extender:Tytus Strahle, Kathee Delton, MAKIERA Weeks in Treatment: 2 Verbal / Phone Orders: No Diagnosis Coding ICD-10 Coding Code Description [phone] Non-pressure chronic ulcer of right calf with fat layer exposed I87.332 Chronic venous hypertension (idiopathic) with ulcer and inflammation of left lower extremity E11.622 Type 2 diabetes mellitus with other skin ulcer Follow-up Appointments Return Appointment in 1 week. Dressing Change Frequency Do not change entire dressing for one week. Skin Barriers/Peri-Wound Care TCA Cream or Ointment Wound Cleansing May shower with protection. Primary Wound Dressing Wound #3 Right,Medial Lower Leg Calcium Alginate with Silver Secondary Dressing Wound #3 Right,Medial Lower Leg Dry Gauze ABD pad Edema Control Wound #3 Right,Medial Lower Leg 3 Layer Compression System - Right Lower Extremity Consults Vascular - ref to vascualr for follow up on venous reflux studies - (ICD10 E33.295 - Chronic venous hypertension (idiopathic) with ulcer and inflammation of left lower extremity) Electronic Signature(s) Signed: 09/16/2019 5:14:23 PM By: Linton Ham MD Signed: 09/17/2019  5:16:33 PM By: Carlene Coria RN Entered By: Carlene Coria on 09/16/2019 16:36:56 -------------------------------------------------------------------------------- Prescription 09/16/2019 Patient Name: Krystal Little. Provider: Linton Ham MD Date of Birth: [birth year] NPI#: [id number] Sex: F DEA#: [id number] Phone #: [phone] License #: [id number] Patient Address: [address] [place], [place] [address] [place], [place] [address] [phone] Allergies No Known Drug Allergies Provider's Orders Vascular - ICD10: I87.332 - ref to vascualr for follow up on venous reflux studies Signature(s): Date(s): Electronic Signature(s) Signed: 09/16/2019 5:14:23 PM By: Linton Ham MD Signed: 09/17/2019 5:16:33 PM By: Carlene Coria RN Entered By: Carlene Coria on 09/16/2019 16:36:56 --------------------------------------------------------------------------------  Problem List Details Patient Name: Date of Service: [name] 09/16/2019 3:45 PM Medical Record [id number] Patient Account Number: [id number] Date of Birth/Sex: Treating RN: 1956/05/02 (64 y.o. [name] Primary Care Provider: Stark Klein Other Clinician: Referring Provider: Treating Provider/Extender:Allia Wiltsey, Kathee Delton, MAKIERA Weeks in Treatment: 2 Active Problems ICD-10 Evaluated Encounter Code Description Active Date Today Diagnosis L97.212 Non-pressure chronic ulcer of right calf with fat layer 09/02/2019 No Yes exposed I87.332 Chronic venous hypertension (idiopathic) with ulcer 09/02/2019 No Yes and inflammation of left lower extremity E11.622 Type 2 diabetes mellitus with other skin ulcer 09/02/2019 No Yes Inactive Problems Resolved Problems Electronic Signature(s) Signed: 09/16/2019 5:14:23 PM By: Linton Ham MD Entered By: Linton Ham on 09/16/2019  16:55:39 -------------------------------------------------------------------------------- Progress Note Details Patient Name: Date of Service: [name] 09/16/2019 3:45 PM Medical Record [id number] Patient Account Number: [id number] Date of Birth/Sex: Treating RN: 17-Mar-1956 (64 y.o. [name] Primary Care Provider: Stark Klein Other Clinician: Referring Provider: Treating Provider/Extender:Denim Start, Kathee Delton, MAKIERA Weeks in Treatment: 2 Subjective History of Present Illness (HPI) ADMISSION 09/02/2019 This is a 64 year old woman who is a poorly controlled type II diabetic with a recent hemoglobin A1c of 12.2. She tells [place] that she developed a wound on her right medial calf sometime in November. She is not  exactly sure how this happened. She wonders if she got bit by something. She went to the ER at the end of December and they felt this was an abscess. They gave her doxycycline they did not culture this and she used topical Neosporin. At that point it was apparently draining. On 08/18/2019 she saw her primary physician at family practice. Correctly noted that this was probably venous insufficiency but listed in the differential for an inflammatory ulcer of uncertain etiology and wondered about a biopsy. They gave her an additional 10 days of doxycycline and she has been using triple antibiotics. She says the wound actually has been getting larger. Past medical history includes type 2 diabetes uncontrolled, chronic atrial fibrillation on Coumadin, status post mitral valve replacement, pacemaker, hyperlipidemia and hypertension. ABIs in our clinic were 0.80 on the right however the patient's pulses in her foot at both the dorsalis pedis and posterior tibial are vibrant 2/23; patient wound actually looks somewhat better. Still several open areas. Debris on the surface. We have been using Iodoflex under compression. 3/2; patient has an odd wound on the medial  part of her mid calf. Some surrounding inflammation which could be venous inflammation. The wound is larger and deeper today. The patient had a reflux studies. She has greater saphenous vein reflux at the saphenofemoral junction as well as small saphenous vein reflux in the proximal calf and mid calf. There is no evidence of a DVT from the common femoral to the popliteal veins. I have referred her to vascular surgery for review of the reflux vis--vis the wound area on the left medial calf As the wound did not progress on a trajectory I went back and discussed biopsying this area with the patient which was one of the reasons her primary doctor sent her here. I told her the issue here would did make sure this is not an atypical ulcer either malignant or other types of inflammatory ulcers. In any case she refused. I told her that we would bring this up again next week. Objective Constitutional Patient is hypertensive.. Pulse regular and within target range for patient.Marland Kitchen Respirations regular, non-labored and within target range.. Temperature is normal and within the target range for the patient.Marland Kitchen Appears in no distress. Vitals Time Taken: 4:00 PM, Height: 64 in, Source: Stated, Weight: 140 lbs, Source: Stated, BMI: 24, Temperature: 98.2 F, Pulse: 65 bpm, Respiratory Rate: 18 breaths/min, Blood Pressure: 156/70 mmHg. Cardiovascular Pedal pulses are palpable. General Notes: Wound exam; the wound surface does not look that bad but it has depth there is undermining and it seems to have gone under normal skin with open areas developing here especially laterally. The whole appearance of it is atypical. There is surrounding inflammation but no tenderness I do not believe this is an infection issue Integumentary (Hair, Skin) There is some erythema around the wound/inflammation but no tenderness to suggest infection. Wound #3 status is Open. Original cause of wound was Bump. The wound is located on the  Right,Medial Lower Leg. The wound measures 4.3cm length x 3.7cm width x 0.3cm depth; 12.496cm^2 area and 3.749cm^3 volume. There is Fat Layer (Subcutaneous Tissue) Exposed exposed. There is no tunneling or undermining noted. There is a medium amount of serosanguineous drainage noted. The wound margin is distinct with the outline attached to the wound base. There is large (67-100%) red, pink granulation within the wound bed. There is a small (1-33%) amount of necrotic tissue within the wound bed including Adherent Slough. 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 There was a Three Layer Compression Therapy Procedure by Carlene Coria, RN. Post procedure Diagnosis Wound #3: Same as Pre-Procedure Plan Follow-up Appointments: Return Appointment in 1 week. Dressing Change Frequency: Do not change entire dressing for one week. Skin Barriers/Peri-Wound Care: TCA Cream or Ointment Wound Cleansing: May shower with protection. Primary Wound Dressing: Wound #3 Right,Medial Lower Leg: Calcium Alginate with Silver Secondary Dressing: Wound #3 Right,Medial Lower Leg: Dry Gauze ABD pad Edema Control: Wound #3 Right,Medial Lower Leg: 3 Layer Compression System - Right Lower Extremity Consults ordered were: Vascular - ref to vascualr for follow up on venous reflux studies 1. Continue silver alginate 2. ABDs under 3 layer compression 3. Vascular surgery consultation with her gait guards to the small saphenous vein reflux 4. I offered to biopsy this today and in fact I suggested it as this is not behaving in a standard fashion she refused. I will try to bring this up next  week Electronic Signature(s) Signed: 09/16/2019 5:14:23 PM By: Linton Ham MD Entered By: Linton Ham on 09/16/2019 17:04:13 -------------------------------------------------------------------------------- SuperBill Details Patient Name: Date of Service: [name] 09/16/2019 Medical Record [id number] Patient Account Number: [id number] Date of Birth/Sex: Treating RN: 1956/02/11 (64 y.o. [name] Primary Care Provider: Stark Klein Other Clinician: Referring Provider: Treating Provider/Extender:Quanita Barona, Kathee Delton, MAKIERA Weeks in Treatment: 2 Diagnosis Coding ICD-10 Codes Code Description [phone] Non-pressure chronic ulcer of right calf with fat layer exposed I87.332 Chronic venous hypertension (idiopathic) with ulcer and inflammation of left lower extremity E11.622 Type 2 diabetes mellitus with other skin ulcer Facility Procedures CPT4 Code Description: [id number] (Facility Use Only) [phone] - [facility] COMPRS LWR RT LEG Modifier: Quantity: 1 Physician Procedures CPT4 Code Description: [id number] [place] - WC PHYS LEVEL 3 - EST PT ICD-10 Diagnosis Description L97.212 Non-pressure chronic ulcer of right calf with fat layer exposed I87.332 Chronic venous hypertension (idiopathic) with ulcer and inflamm extremity Modifier: ation of left low Quantity: 1 er Engineer, maintenance) Signed: 09/16/2019 5:14:23 PM By: Linton Ham MD Entered By: Linton Ham on 09/16/2019 17:04:36

## 2019-09-17 NOTE — Progress Notes (Signed)
Krystal, Little ([id number]) Visit Report for 09/16/2019 Arrival Information Details Patient Name: Date of Service: Krystal, Little 09/16/2019 3:45 PM Medical Record [id number] Patient Account Number: [id number] Date of Birth/Sex: Treating RN: 05-Dec-1955 (64 y.o. Elam Dutch Primary Care Adaliz Dobis: Stark Klein Other Clinician: Referring Annmarie Plemmons: Treating Kihanna Kamiya/Extender:Robson, Kathee Delton, MAKIERA Weeks in Treatment: 2 Visit Information History Since Last Visit Added or deleted any medications: No Patient Arrived: Ambulatory Any new allergies or adverse reactions: No Arrival Time: 15:56 Had a fall or experienced change in No Accompanied By: self activities of daily living that may affect Transfer Assistance: None risk of falls: Patient Identification Verified: Yes Signs or symptoms of abuse/neglect since last No Secondary Verification Process Yes visito Completed: Hospitalized since last visit: No Patient Requires Transmission- No Implantable device outside of the clinic excluding No Based Precautions: cellular tissue based products placed in the center Patient Has Alerts: Yes since last visit: Patient Alerts: Patient on Blood Has Dressing in Place as Prescribed: Yes Thinner Has Compression in Place as Prescribed: Yes Pain Present Now: Yes Electronic Signature(s) Signed: 09/16/2019 4:52:50 PM By: Baruch Gouty RN, BSN Entered By: Baruch Gouty on 09/16/2019 15:59:17 -------------------------------------------------------------------------------- Compression Therapy Details Patient Name: Date of Service: Krystal Little 09/16/2019 3:45 PM Medical Record [id number] Patient Account Number: [id number] Date of Birth/Sex: Treating RN: 26-Dec-1955 (64 y.o. [name] Primary Care Juliano Mceachin: Stark Klein Other Clinician: Referring Brock Larmon: Treating Maisha Bogen/Extender:Robson, Kathee Delton, MAKIERA Weeks in Treatment:  2 Compression Therapy Performed for Wound Wound #3 Right,Medial Lower Leg Assessment: Performed By: Clinician Carlene Coria, RN Compression Type: Three Layer Post Procedure Diagnosis Same as Pre-procedure Electronic Signature(s) Signed: 09/17/2019 5:16:33 PM By: Carlene Coria RN Entered By: Carlene Coria on 09/16/2019 16:37:20 -------------------------------------------------------------------------------- Encounter Discharge Information Details Patient Name: Date of Service: Krystal Little 09/16/2019 3:45 PM Medical Record [id number] Patient Account Number: [id number] Date of Birth/Sex: Treating RN: 03/10/1956 (64 y.o. [name] Primary Care Miela Desjardin: Stark Klein Other Clinician: Referring Syrus Nakama: Treating Christhoper Busbee/Extender:Robson, Kathee Delton, MAKIERA Weeks in Treatment: 2 Encounter Discharge Information Items Discharge Condition: Stable Ambulatory Status: Ambulatory Discharge Destination: Home Transportation: Private Auto Accompanied By: self Schedule Follow-up Appointment: Yes Clinical Summary of Care: Patient Declined Electronic Signature(s) Signed: 09/16/2019 5:07:00 PM By: Kela Millin Entered By: Kela Millin on 09/16/2019 16:49:34 -------------------------------------------------------------------------------- Lower Extremity Assessment Details Patient Name: Date of Service: Krystal, Little 09/16/2019 3:45 PM Medical Record [id number] Patient Account Number: [id number] Date of Birth/Sex: Treating RN: [birth year] (64 y.o. Elam Dutch Primary Care Louann Hopson: Stark Klein Other Clinician: Referring Jameya Pontiff: Treating Artin Mceuen/Extender:Robson, Kathee Delton, MAKIERA Weeks in Treatment: 2 Edema Assessment Assessed: [Left: No] [Right: No] Edema: [Left: No] [Right: No] Calf Left: Right: Point of Measurement: 26 cm From Medial Instep cm 32.2 cm Ankle Left: Right: Point of Measurement: 11 cm From Medial Instep cm 21  cm Vascular Assessment Pulses: Dorsalis Pedis Palpable: [Right:Yes] Electronic Signature(s) Signed: 09/16/2019 4:52:50 PM By: Baruch Gouty RN, BSN Entered By: Baruch Gouty on 09/16/2019 16:09:39 -------------------------------------------------------------------------------- Multi Wound Chart Details Patient Name: Date of Service: Krystal Little 09/16/2019 3:45 PM Medical Record [id number] Patient Account Number: [id number] Date of Birth/Sex: Treating RN: [birth year] (64 y.o. [name] Primary Care Shonnie Poudrier: Stark Klein Other Clinician: Referring Analyse Angst: Treating Kie Calvin/Extender:Robson, Kathee Delton, MAKIERA Weeks in Treatment: 2 Vital Signs Height(in): 64 Pulse(bpm): 65 Weight(lbs): 140 Blood Pressure(mmHg): 156/70 Body Mass Index(BMI): 24 Temperature(F): 98.2 Respiratory 18 Rate(breaths/min): Photos: [3:No Photos] [N/A:N/A] Wound Location: [3:Right Lower Leg - Medial] [N/A:N/A] Wounding Event: [3:Bump] [N/A:N/A]  Primary Etiology: [3:Venous Leg Ulcer] [N/A:N/A] Comorbid History: [3:Arrhythmia, Deep Vein Thrombosis, Type II Diabetes] [N/A:N/A] Date Acquired: [3:05/18/2019] [N/A:N/A] Weeks of Treatment: [3:2] [N/A:N/A] Wound Status: [3:Open] [N/A:N/A] Clustered Wound: [3:Yes] [N/A:N/A] Clustered Quantity: [3:3] [N/A:N/A] Measurements L x W x D 4.3x3.7x0.3 [N/A:N/A] (cm) Area (cm) : [3:12.496] [N/A:N/A] Volume (cm) : [3:3.749] [N/A:N/A] % Reduction in Area: [3:-51.50%] [N/A:N/A] % Reduction in Volume: [3:-354.40%] [N/A:N/A] Classification: [3:Full Thickness Without Exposed Support Structures] [N/A:N/A] Exudate Amount: [3:Medium] [N/A:N/A] Exudate Type: [3:Serosanguineous] [N/A:N/A] Exudate Color: [3:red, brown] [N/A:N/A] Wound Margin: [3:Distinct, outline attached N/A] Granulation Amount: [3:Large (67-100%)] [N/A:N/A] Granulation Quality: [3:Red, Pink] [N/A:N/A] Necrotic Amount: [3:Small (1-33%)] [N/A:N/A] Exposed Structures: [3:Fat  Layer (Subcutaneous N/A Tissue) Exposed: Yes Fascia: No Tendon: No Muscle: No Joint: No Bone: No] Epithelialization: [3:Small (1-33%) Compression Therapy] [N/A:N/A N/A] Treatment Notes Wound #3 (Right, Medial Lower Leg) 1. Cleanse With Wound Cleanser Soap and water 2. Periwound Care TCA Cream 3. Primary Dressing Applied Calcium Alginate Ag 4. Secondary Dressing ABD Pad Dry Gauze 6. Support Layer Applied 3 layer compression wrap Notes netting Electronic Signature(s) Signed: 09/16/2019 5:14:23 PM By: Linton Ham MD Signed: 09/17/2019 5:16:33 PM By: Carlene Coria RN Entered By: Linton Ham on 09/16/2019 16:55:48 -------------------------------------------------------------------------------- Multi-Disciplinary Care Plan Details Patient Name: Date of Service: Krystal, Little 09/16/2019 3:45 PM Medical Record [id number] Patient Account Number: [id number] Date of Birth/Sex: Treating RN: 01-01-1956 (64 y.o. [name] Primary Care Kebra Lowrimore: Stark Klein Other Clinician: Referring Trinia Georgi: Treating Rollin Kotowski/Extender:Robson, Kathee Delton, MAKIERA Weeks in Treatment: 2 Active Inactive Wound/Skin Impairment Nursing Diagnoses: Knowledge deficit related to ulceration/compromised skin integrity Goals: Patient/caregiver will verbalize understanding of skin care regimen Date Initiated: 09/02/2019 Target Resolution Date: 09/30/2019 Goal Status: Active Ulcer/skin breakdown will have a volume reduction of 30% by week 4 Date Initiated: 09/02/2019 Target Resolution Date: 09/30/2019 Goal Status: Active Interventions: Assess patient/caregiver ability to obtain necessary supplies Assess patient/caregiver ability to perform ulcer/skin care regimen upon admission and as needed Assess ulceration(s) every visit Notes: Electronic Signature(s) Signed: 09/17/2019 5:16:33 PM By: Carlene Coria RN Entered By: Carlene Coria on 09/16/2019  16:00:46 -------------------------------------------------------------------------------- Pain Assessment Details Patient Name: Date of Service: Krystal, Little 09/16/2019 3:45 PM Medical Record [id number] Patient Account Number: [id number] Date of Birth/Sex: Treating RN: Mar 23, 1956 (64 y.o. Elam Dutch Primary Care Deonne Rooks: Stark Klein Other Clinician: Referring Gumaro Brightbill: Treating Desma Wilkowski/Extender:Robson, Kathee Delton, MAKIERA Weeks in Treatment: 2 Active Problems Location of Pain Severity and Description of Pain Patient Has Paino Yes Site Locations Pain Location: Pain in Ulcers With Dressing Change: Yes Rate the pain. Current Pain Level: 3 Worst Pain Level: 6 Least Pain Level: 0 Character of Pain Describe the Pain: Aching, Tender Pain Management and Medication Current Pain Management: Activity: Yes Is the Current Pain Management Adequate: Adequate How does your wound impact your activities of daily livingo Sleep: No Bathing: No Appetite: No Relationship With Others: No Bladder Continence: No Emotions: No Bowel Continence: No Work: No Toileting: No Drive: No Dressing: No Hobbies: No Electronic Signature(s) Signed: 09/16/2019 4:52:50 PM By: Baruch Gouty RN, BSN Entered By: Baruch Gouty on 09/16/2019 16:08:57 -------------------------------------------------------------------------------- Patient/Caregiver Education Details Patient Name: Date of Service: Krystal Little 3/2/2021andnbsp3:45 PM Medical Record [id number] Patient Account Number: [id number] Date of Birth/Gender: Treating RN: 1956/02/06 (64 y.o. [name] Primary Care Physician: Stark Klein Other Clinician: Referring Physician: Treating Physician/Extender:Robson, Kathee Delton, Danise Mina in Treatment: 2 Education Assessment Education Provided To: Patient Education Topics Provided Wound/Skin Impairment: Methods: Explain/Verbal Responses: State  content correctly Electronic Signature(s) Signed: 09/17/2019  5:16:33 PM By: Carlene Coria RN Entered By: Carlene Coria on 09/16/2019 16:00:58 -------------------------------------------------------------------------------- Wound Assessment Details Patient Name: Date of Service: Krystal, Little 09/16/2019 3:45 PM Medical Record [id number] Patient Account Number: [id number] Date of Birth/Sex: Treating RN: [birth year] (64 y.o. [name] Primary Care Ike Maragh: Stark Klein Other Clinician: Referring Estell Puccini: Treating Zionah Criswell/Extender:Robson, Kathee Delton, MAKIERA Weeks in Treatment: 2 Wound Status Wound Number: 3 Primary Venous Leg Ulcer Etiology: Wound Location: Right Lower Leg - Medial Wound Open Wounding Event: Bump Status: Date Acquired: 05/18/2019 Comorbid Arrhythmia, Deep Vein Thrombosis, Type Weeks Of Treatment: 2 History: II Diabetes Clustered Wound: Yes Photos Wound Measurements Length: (cm) 4.3 % Reduct Width: (cm) 3.7 % Reduct Depth: (cm) 0.3 Epitheli Clustered Quantity: 3 Tunnelin Area: (cm) 12.496 Undermi Volume: (cm) 3.749 Wound Description Classification: Full Thickness Without Exposed Support Foul Od Structures Slough/ Wound Distinct, outline attached Margin: Exudate Medium Amount: Exudate Serosanguineous Type: Exudate red, brown Color: Wound Bed Granulation Amount: Large (67-100%) Granulation Quality: Red, Pink Fascia Ex Necrotic Amount: Small (1-33%) Fat Layer Necrotic Quality: Adherent Slough Tendon Ex Muscle Ex Joint Exp Bone Expo or After Cleansing: No Fibrino Yes Exposed Structure posed: No (Subcutaneous Tissue) Exposed: Yes posed: No posed: No osed: No sed: No ion in Area: -51.5% ion in Volume: -354.4% alization: Small (1-33%) g: No ning: No Treatment Notes Wound #3 (Right, Medial Lower Leg) 1. Cleanse With Wound Cleanser Soap and water 2. Periwound Care TCA Cream 3. Primary Dressing Applied Calcium  Alginate Ag 4. Secondary Dressing ABD Pad Dry Gauze 6. Support Layer Applied 3 layer compression wrap Notes netting Electronic Signature(s) Signed: 09/17/2019 4:03:04 PM By: Mikeal Hawthorne EMT/HBOT Signed: 09/17/2019 5:16:33 PM By: Carlene Coria RN Previous Signature: 09/16/2019 4:52:50 PM Version By: Baruch Gouty RN, BSN Entered By: Mikeal Hawthorne on 09/17/2019 14:26:46 -------------------------------------------------------------------------------- [place] Details Patient Name: Date of Service: Krystal Little 09/16/2019 3:45 PM Medical Record [id number] Patient Account Number: [id number] Date of Birth/Sex: Treating RN: Apr 06, 1956 (64 y.o. Elam Dutch Primary Care Dalary Hollar: Stark Klein Other Clinician: Referring Ledon Weihe: Treating Hadiya Spoerl/Extender:Robson, Kathee Delton, MAKIERA Weeks in Treatment: 2 Vital Signs Time Taken: 16:00 Temperature (F): 98.2 Height (in): 64 Pulse (bpm): 65 Source: Stated Respiratory Rate (breaths/min): 18 Weight (lbs): 140 Blood Pressure (mmHg): 156/70 Source: Stated Reference Range: 80 - 120 mg / dl Body Mass Index (BMI): 24 Electronic Signature(s) Signed: 09/16/2019 4:52:50 PM By: Baruch Gouty RN, BSN Entered By: Baruch Gouty on 09/16/2019 16:02:13

## 2019-09-23 ENCOUNTER — Encounter (HOSPITAL_BASED_OUTPATIENT_CLINIC_OR_DEPARTMENT_OTHER): Payer: Medicare Other | Admitting: Internal Medicine

## 2019-09-23 ENCOUNTER — Other Ambulatory Visit: Payer: Self-pay

## 2019-09-23 DIAGNOSIS — Z952 Presence of prosthetic heart valve: Secondary | ICD-10-CM | POA: Diagnosis not present

## 2019-09-23 DIAGNOSIS — E11622 Type 2 diabetes mellitus with other skin ulcer: Secondary | ICD-10-CM | POA: Diagnosis not present

## 2019-09-23 DIAGNOSIS — I10 Essential (primary) hypertension: Secondary | ICD-10-CM | POA: Diagnosis not present

## 2019-09-23 DIAGNOSIS — Z7901 Long term (current) use of anticoagulants: Secondary | ICD-10-CM | POA: Diagnosis not present

## 2019-09-23 DIAGNOSIS — Z950 Presence of cardiac pacemaker: Secondary | ICD-10-CM | POA: Diagnosis not present

## 2019-09-23 DIAGNOSIS — I482 Chronic atrial fibrillation, unspecified: Secondary | ICD-10-CM | POA: Diagnosis not present

## 2019-09-23 DIAGNOSIS — Z86718 Personal history of other venous thrombosis and embolism: Secondary | ICD-10-CM | POA: Diagnosis not present

## 2019-09-23 DIAGNOSIS — L97212 Non-pressure chronic ulcer of right calf with fat layer exposed: Secondary | ICD-10-CM | POA: Diagnosis not present

## 2019-09-23 DIAGNOSIS — E785 Hyperlipidemia, unspecified: Secondary | ICD-10-CM | POA: Diagnosis not present

## 2019-09-23 DIAGNOSIS — I87331 Chronic venous hypertension (idiopathic) with ulcer and inflammation of right lower extremity: Secondary | ICD-10-CM | POA: Diagnosis not present

## 2019-09-24 NOTE — Progress Notes (Signed)
Krystal Little, Krystal Little ([id number]) Visit Report for 09/23/2019 HPI Details Patient Name: Date of Service: Krystal Little 09/23/2019 4:00 PM Medical Record [id number] Patient Account Number: [id number] Date of Birth/Sex: Treating RN: 01/21/1956 (64 y.o. [name] Primary Care Provider: Stark Klein Other Clinician: Referring Provider: Treating Provider/Extender:Heavyn Yearsley, Kathee Delton, MAKIERA Weeks in Treatment: 3 History of Present Illness HPI Description: ADMISSION 09/02/2019 This is a 64 year old woman who is a poorly controlled type II diabetic with a recent hemoglobin A1c of 12.2. She tells [place] that she developed a wound on her right medial calf sometime in November. She is not exactly sure how this happened. She wonders if she got bit by something. She went to the ER at the end of December and they felt this was an abscess. They gave her doxycycline they did not culture this and she used topical Neosporin. At that point it was apparently draining. On 08/18/2019 she saw her primary physician at family practice. Correctly noted that this was probably venous insufficiency but listed in the differential for an inflammatory ulcer of uncertain etiology and wondered about a biopsy. They gave her an additional 10 days of doxycycline and she has been using triple antibiotics. She says the wound actually has been getting larger. Past medical history includes type 2 diabetes uncontrolled, chronic atrial fibrillation on Coumadin, status post mitral valve replacement, pacemaker, hyperlipidemia and hypertension. ABIs in our clinic were 0.80 on the right however the patient's pulses in her foot at both the dorsalis pedis and posterior tibial are vibrant 2/23; patient wound actually looks somewhat better. Still several open areas. Debris on the surface. We have been using Iodoflex under compression. 3/2; patient has an odd wound on the medial part of her mid calf. Some surrounding  inflammation which could be venous inflammation. The wound is larger and deeper today. The patient had a reflux studies. She has greater saphenous vein reflux at the saphenofemoral junction as well as small saphenous vein reflux in the proximal calf and mid calf. There is no evidence of a DVT from the common femoral to the popliteal veins. I have referred her to vascular surgery for review of the reflux vis--vis the wound area on the left medial calf As the wound did not progress on a trajectory I went back and discussed biopsying this area with the patient which was one of the reasons her primary doctor sent her here. I told her the issue here would did make sure this is not an atypical ulcer either malignant or other types of inflammatory ulcers. In any case she refused. I told her that we would bring this up again next week. 3/9; continued odd looking wound on the medial part of her right calf. Some surrounding inflammation which could be venous inflammation. The wound itself has open areas but also nonadherent skin with connecting small holes. I told her I wanted to biopsy this last week and she refused. Today I told her I wanted to remove some of the overhanging skin connecting the small holes to allow better dressing of the actual wound area. She also refused this. Finally she has significant reflux involving the small saphenous vein in the proximal calf and mid calf. I would like vascular surgery to review this for possible ablation but I do not have an appointment yet Electronic Signature(s) Signed: 09/23/2019 5:48:31 PM By: Linton Ham MD Entered By: Linton Ham on 09/23/2019 17:31:10 -------------------------------------------------------------------------------- Physical Exam Details Patient Name: Date of Service: Krystal Little B. 09/23/2019 4:00 PM  Medical Record [id number] Patient Account Number: [id number] Date of Birth/Sex: Treating RN: [birth year] (64 y.o. [name] Primary Care Provider: Stark Klein Other Clinician: Referring Provider: Treating Provider/Extender:Shakenna Herrero, Kathee Delton, MAKIERA Weeks in Treatment: 3 Constitutional Patient is hypertensive.. Pulse regular and within target range for patient.Marland Kitchen Respirations regular, non-labored and within target range.. Temperature is normal and within the target range for the patient.Marland Kitchen Appears in no distress. Notes Wound exam; the wound surface does not look that bad however she has nonadherent skin superiorly in the wound with several small open areas that all connect. The whole appearance of this is atypical. She would not allow debridement and she would not allow biopsy saying that things actually feel better Electronic Signature(s) Signed: 09/23/2019 5:48:31 PM By: Linton Ham MD Entered By: Linton Ham on 09/23/2019 17:31:57 -------------------------------------------------------------------------------- Physician Orders Details Patient Name: Date of Service: [name] 09/23/2019 4:00 PM Medical Record [id number] Patient Account Number: [id number] Date of Birth/Sex: Treating RN: August 03, 1955 (64 y.o. [name] Primary Care Provider: Stark Klein Other Clinician: Referring Provider: Treating Provider/Extender:Emmanuel Ercole, Kathee Delton, MAKIERA Weeks in Treatment: 3 Verbal / Phone Orders: No Diagnosis Coding ICD-10 Coding Code Description [phone] Non-pressure chronic ulcer of right calf with fat layer exposed I87.332 Chronic venous hypertension (idiopathic) with ulcer and inflammation of left lower extremity E11.622 Type 2 diabetes mellitus with other skin ulcer Follow-up Appointments Return Appointment in 1 week. Dressing Change Frequency Do not change entire dressing for one week. Skin Barriers/Peri-Wound Care TCA Cream or Ointment Wound Cleansing May shower with protection. Primary Wound Dressing Wound #3 Right,Medial Lower Leg Calcium Alginate  with Silver Secondary Dressing Wound #3 Right,Medial Lower Leg Dry Gauze ABD pad Edema Control 3 Layer Compression System - Right Lower Extremity Electronic Signature(s) Signed: 09/23/2019 5:48:31 PM By: Linton Ham MD Signed: 09/24/2019 7:20:02 AM By: Carlene Coria RN Entered By: Carlene Coria on 09/23/2019 17:25:34 -------------------------------------------------------------------------------- Problem List Details Patient Name: Date of Service: [name] 09/23/2019 4:00 PM Medical Record [id number] Patient Account Number: [id number] Date of Birth/Sex: Treating RN: [birth year] (64 y.o. [name] Primary Care Provider: Stark Klein Other Clinician: Referring Provider: Treating Provider/Extender:Shawnetta Lein, Kathee Delton, MAKIERA Weeks in Treatment: 3 Active Problems ICD-10 Evaluated Encounter Code Description Active Date Today Diagnosis L97.212 Non-pressure chronic ulcer of right calf with fat layer 09/02/2019 No Yes exposed I87.332 Chronic venous hypertension (idiopathic) with ulcer 09/02/2019 No Yes and inflammation of left lower extremity E11.622 Type 2 diabetes mellitus with other skin ulcer 09/02/2019 No Yes Inactive Problems Resolved Problems Electronic Signature(s) Signed: 09/23/2019 5:48:31 PM By: Linton Ham MD Entered By: Linton Ham on 09/23/2019 17:29:32 -------------------------------------------------------------------------------- Progress Note Details Patient Name: Date of Service: [name] 09/23/2019 4:00 PM Medical Record [id number] Patient Account Number: [id number] Date of Birth/Sex: Treating RN: 1956-03-29 (64 y.o. [name] Primary Care Provider: Stark Klein Other Clinician: Referring Provider: Treating Provider/Extender:Bretta Fees, Kathee Delton, MAKIERA Weeks in Treatment: 3 Subjective History of Present Illness (HPI) ADMISSION 09/02/2019 This is a 64 year old woman who is a poorly controlled type  II diabetic with a recent hemoglobin A1c of 12.2. She tells [place] that she developed a wound on her right medial calf sometime in November. She is not exactly sure how this happened. She wonders if she got bit by something. She went to the ER at the end of December and they felt this was an abscess. They gave her doxycycline they did not culture this and she used topical Neosporin. At that point it was apparently draining.  On 08/18/2019 she saw her primary physician at family practice. Correctly noted that this was probably venous insufficiency but listed in the differential for an inflammatory ulcer of uncertain etiology and wondered about a biopsy. They gave her an additional 10 days of doxycycline and she has been using triple antibiotics. She says the wound actually has been getting larger. Past medical history includes type 2 diabetes uncontrolled, chronic atrial fibrillation on Coumadin, status post mitral valve replacement, pacemaker, hyperlipidemia and hypertension. ABIs in our clinic were 0.80 on the right however the patient's pulses in her foot at both the dorsalis pedis and posterior tibial are vibrant 2/23; patient wound actually looks somewhat better. Still several open areas. Debris on the surface. We have been using Iodoflex under compression. 3/2; patient has an odd wound on the medial part of her mid calf. Some surrounding inflammation which could be venous inflammation. The wound is larger and deeper today. The patient had a reflux studies. She has greater saphenous vein reflux at the saphenofemoral junction as well as small saphenous vein reflux in the proximal calf and mid calf. There is no evidence of a DVT from the common femoral to the popliteal veins. I have referred her to vascular surgery for review of the reflux vis--vis the wound area on the left medial calf As the wound did not progress on a trajectory I went back and discussed biopsying this area with the patient  which was one of the reasons her primary doctor sent her here. I told her the issue here would did make sure this is not an atypical ulcer either malignant or other types of inflammatory ulcers. In any case she refused. I told her that we would bring this up again next week. 3/9; continued odd looking wound on the medial part of her right calf. Some surrounding inflammation which could be venous inflammation. The wound itself has open areas but also nonadherent skin with connecting small holes. I told her I wanted to biopsy this last week and she refused. Today I told her I wanted to remove some of the overhanging skin connecting the small holes to allow better dressing of the actual wound area. She also refused this. Finally she has significant reflux involving the small saphenous vein in the proximal calf and mid calf. I would like vascular surgery to review this for possible ablation but I do not have an appointment yet Objective Constitutional Patient is hypertensive.. Pulse regular and within target range for patient.Marland Kitchen Respirations regular, non-labored and within target range.. Temperature is normal and within the target range for the patient.Marland Kitchen Appears in no distress. Vitals Time Taken: 4:41 PM, Height: 64 in, Weight: 140 lbs, BMI: 24, Temperature: 98.5 F, Pulse: 86 bpm, Respiratory Rate: 18 breaths/min, Blood Pressure: 167/79 mmHg. General Notes: Wound exam; the wound surface does not look that bad however she has nonadherent skin superiorly in the wound with several small open areas that all connect. The whole appearance of this is atypical. She would not allow debridement and she would not allow biopsy saying that things actually feel better Integumentary (Hair, Skin) Wound #3 status is Open. Original cause of wound was Bump. The wound is located on the Right,Medial Lower Leg. The wound measures 4.2cm length x 2cm width x 0.3cm depth; 6.597cm^2 area and 1.979cm^3 volume. There is Fat  Layer (Subcutaneous Tissue) Exposed exposed. There is no tunneling or undermining noted. There is a medium amount of serosanguineous drainage noted. The wound margin is distinct with the outline  attached to the wound base. There is large (67-100%) red, pink granulation within the wound bed. There is a small (1-33%) amount of necrotic tissue within the wound bed including Adherent Slough. 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 There was a Three Layer Compression Therapy Procedure by Carlene Coria, RN. Post procedure Diagnosis Wound #3: Same as Pre-Procedure Plan Follow-up Appointments: Return Appointment in 1 week. Dressing Change Frequency: Do not change entire dressing for one week. Skin Barriers/Peri-Wound Care: TCA Cream or Ointment Wound Cleansing: May shower with protection. Primary Wound Dressing: Wound #3 Right,Medial Lower Leg: Calcium Alginate with Silver Secondary Dressing: Wound #3 Right,Medial Lower Leg: Dry Gauze ABD pad Edema Control: 3 Layer Compression System - Right Lower Extremity 1. I am continue with TCA silver alginate under 3 layer compression 2. The wound is not any worse but is certainly not any better. 3. She is refusing biopsy consistently and also refusing debridement 4. Unless this lady will let me debride and biopsy I am not sure I can help her. 5. Still looking for a vascular surgery consultation Electronic Signature(s) Signed: 09/23/2019 5:48:31 PM By: Linton Ham MD Entered By: Linton Ham on 09/23/2019 17:34:56 -------------------------------------------------------------------------------- SuperBill Details Patient  Name: Date of Service: [name] 09/23/2019 Medical Record [id number] Patient Account Number: [id number] Date of Birth/Sex: Treating RN: Oct 10, 1955 (64 y.o. [name] Primary Care Provider: Stark Klein Other Clinician: Referring Provider: Treating Provider/Extender:Latunya Kissick, Kathee Delton, MAKIERA Weeks in Treatment: 3 Diagnosis Coding ICD-10 Codes Code Description [phone] Non-pressure chronic ulcer of right calf with fat layer exposed I87.332 Chronic venous hypertension (idiopathic) with ulcer and inflammation of left lower extremity E11.622 Type 2 diabetes mellitus with other skin ulcer Facility Procedures CPT4 Code Description: [id number] (Facility Use Only) [phone] - [place] RT LEG Modifier: Quantity: 1 Physician Procedures CPT4 Code Description: [id number] - WC PHYS LEVEL 3 - EST PT ICD-10 Diagnosis Description L97.212 Non-pressure chronic ulcer of right calf with fat layer ex I87.332 Chronic venous hypertension (idiopathic) with ulcer and in extremity Modifier: posed flammation of lef Quantity: 1 t lower Electronic Signature(s) Signed: 09/23/2019 5:48:31 PM By: Linton Ham MD Entered By: Linton Ham on 09/23/2019 17:35:17

## 2019-09-25 ENCOUNTER — Other Ambulatory Visit: Payer: Self-pay | Admitting: Cardiology

## 2019-09-30 ENCOUNTER — Encounter (HOSPITAL_BASED_OUTPATIENT_CLINIC_OR_DEPARTMENT_OTHER): Payer: Medicare Other | Admitting: Internal Medicine

## 2019-09-30 ENCOUNTER — Other Ambulatory Visit: Payer: Self-pay

## 2019-09-30 DIAGNOSIS — Z7901 Long term (current) use of anticoagulants: Secondary | ICD-10-CM | POA: Diagnosis not present

## 2019-09-30 DIAGNOSIS — I87331 Chronic venous hypertension (idiopathic) with ulcer and inflammation of right lower extremity: Secondary | ICD-10-CM | POA: Diagnosis not present

## 2019-09-30 DIAGNOSIS — Z952 Presence of prosthetic heart valve: Secondary | ICD-10-CM | POA: Diagnosis not present

## 2019-09-30 DIAGNOSIS — E785 Hyperlipidemia, unspecified: Secondary | ICD-10-CM | POA: Diagnosis not present

## 2019-09-30 DIAGNOSIS — Z950 Presence of cardiac pacemaker: Secondary | ICD-10-CM | POA: Diagnosis not present

## 2019-09-30 DIAGNOSIS — I482 Chronic atrial fibrillation, unspecified: Secondary | ICD-10-CM | POA: Diagnosis not present

## 2019-09-30 DIAGNOSIS — Z86718 Personal history of other venous thrombosis and embolism: Secondary | ICD-10-CM | POA: Diagnosis not present

## 2019-09-30 DIAGNOSIS — L97212 Non-pressure chronic ulcer of right calf with fat layer exposed: Secondary | ICD-10-CM | POA: Diagnosis not present

## 2019-09-30 DIAGNOSIS — I10 Essential (primary) hypertension: Secondary | ICD-10-CM | POA: Diagnosis not present

## 2019-09-30 DIAGNOSIS — E11622 Type 2 diabetes mellitus with other skin ulcer: Secondary | ICD-10-CM | POA: Diagnosis not present

## 2019-10-01 NOTE — Progress Notes (Signed)
[name], Little ([id number]) Visit Report for 09/30/2019 HPI Details Patient Name: Date of Service: Krystal, Little 09/30/2019 7:30 AM Medical Record [id number] Patient Account Number: [id number] Date of Birth/Sex: Treating RN: 1955-09-05 (64 y.o. Krystal Little Primary Care Provider: Stark Little Other Clinician: Referring Provider: Treating Provider/Extender:Krystal Little, Krystal Little, Krystal Little in Treatment: 4 History of Present Illness HPI Description: ADMISSION 09/02/2019 This is a 64 year old woman who is a poorly controlled type II diabetic with a recent hemoglobin A1c of 12.2. She tells [place] that she developed a wound on her right medial calf sometime in November. She is not exactly sure how this happened. She wonders if she got bit by something. She went to the ER at the end of December and they felt this was an abscess. They gave her doxycycline they did not culture this and she used topical Neosporin. At that point it was apparently draining. On 08/18/2019 she saw her primary physician at family practice. Correctly noted that this was probably venous insufficiency but listed in the differential for an inflammatory ulcer of uncertain etiology and wondered about a biopsy. They gave her an additional 10 days of doxycycline and she has been using triple antibiotics. She says the wound actually has been getting larger. Past medical history includes type 2 diabetes uncontrolled, chronic atrial fibrillation on Coumadin, status post mitral valve replacement, pacemaker, hyperlipidemia and hypertension. ABIs in our clinic were 0.80 on the right however the patient's pulses in her foot at both the dorsalis pedis and posterior tibial are vibrant 2/23; patient wound actually looks somewhat better. Still several open areas. Debris on the surface. We have been using Iodoflex under compression. 3/2; patient has an odd wound on the medial part of her mid calf. Some surrounding  inflammation which could be venous inflammation. The wound is larger and deeper today. The patient had a reflux studies. She has greater saphenous vein reflux at the saphenofemoral junction as well as small saphenous vein reflux in the proximal calf and mid calf. There is no evidence of a DVT from the common femoral to the popliteal veins. I have referred her to vascular surgery for review of the reflux vis--vis the wound area on the left medial calf As the wound did not progress on a trajectory I went back and discussed biopsying this area with the patient which was one of the reasons her primary doctor sent her here. I told her the issue here would did make sure this is not an atypical ulcer either malignant or other types of inflammatory ulcers. In any case she refused. I told her that we would bring this up again next week. 3/9; continued odd looking wound on the medial part of her right calf. Some surrounding inflammation which could be venous inflammation. The wound itself has open areas but also nonadherent skin with connecting small holes. I told her I wanted to biopsy this last week and she refused. Today I told her I wanted to remove some of the overhanging skin connecting the small holes to allow better dressing of the actual wound area. She also refused this. Finally she has significant reflux involving the small saphenous vein in the proximal calf and mid calf. I would like vascular surgery to review this for possible ablation but I do not have an appointment yet 3/16; odd looking wound on the medial part of her right calf surrounding inflammation which could be venous inflammation. We have been using silver alginate under compression but really making no headway  here. She has some nonadherent surface epithelium with small holes in it. I do not think this is really viable but she has not let me either debride this or biopsy it. She brings up today that she was actually in this clinic  in 2015 with wounds on the left leg although I have no information on this. She was apparently treated with a skin substitute with healing. Even if that were true I would need to debride this. She sees vein and vascular on 3/23 Electronic Signature(s) Signed: 09/30/2019 5:29:05 PM By: Krystal Ham MD Entered By: Krystal Little on 09/30/2019 08:37:17 -------------------------------------------------------------------------------- Physical Exam Details Patient Name: Date of Service: Krystal Little 09/30/2019 7:30 AM Medical Record [id number] Patient Account Number: [id number] Date of Birth/Sex: Treating RN: [birth year] (64 y.o. Krystal Little Primary Care Provider: Stark Little Other Clinician: Referring Provider: Treating Provider/Extender:Krystal Little, Krystal Little, Krystal Little in Treatment: 4 Constitutional Sitting or standing Blood Pressure is within target range for patient.. Pulse regular and within target range for patient.Marland Kitchen Respirations regular, non-labored and within target range.. Temperature is normal and within the target range for the patient.Marland Kitchen Appears in no distress. Cardiovascular Pedal pulses are palpable. Edema is not present. Very little evidence of chronic venous insufficiency. Notes Wound exam; no real change in this. Regular wound with nonviable surface. Posteriorly nonadherent epithelium small holes in this which probably connect. I think this area needs to be debrided and biopsied however she has not given me permission to do this. There is surrounding inflammation but no real tenderness Electronic Signature(s) Signed: 09/30/2019 5:29:05 PM By: Krystal Ham MD Entered By: Krystal Little on 09/30/2019 08:38:36 -------------------------------------------------------------------------------- Physician Orders Details Patient Name: Date of Service: Krystal Little 09/30/2019 7:30 AM Medical Record [id number] Patient Account Number:  [id number] Date of Birth/Sex: Treating RN: 1956-03-21 (64 y.o. Krystal Little Primary Care Provider: Stark Little Other Clinician: Referring Provider: Treating Provider/Extender:Krystal Little, Krystal Little, Krystal Little in Treatment: 4 Verbal / Phone Orders: No Diagnosis Coding ICD-10 Coding Code Description [phone] Non-pressure chronic ulcer of right calf with fat layer exposed I87.332 Chronic venous hypertension (idiopathic) with ulcer and inflammation of left lower extremity E11.622 Type 2 diabetes mellitus with other skin ulcer Follow-up Appointments Return Appointment in 1 week. Dressing Change Frequency Do not change entire dressing for one week. Skin Barriers/Peri-Wound Care TCA Cream or Ointment Wound Cleansing May shower with protection. Primary Wound Dressing Wound #3 Right,Medial Lower Leg Calcium Alginate with Silver Secondary Dressing Wound #3 Right,Medial Lower Leg Dry Gauze ABD pad Edema Control 3 Layer Compression System - Right Lower Extremity Consults Dermatology - (ICD10 [phone] - Non-pressure chronic ulcer of right calf with fat layer exposed) Electronic Signature(s) Signed: 09/30/2019 5:29:05 PM By: Krystal Ham MD Signed: 10/01/2019 8:57:53 AM By: Carlene Coria RN Entered By: Carlene Coria on 09/30/2019 08:19:59 -------------------------------------------------------------------------------- Prescription 09/30/2019 Patient Name: Krystal Cherry B. Provider: Linton Ham MD Date of Birth: 1955/09/02 NPI#: [id number] Sex: F DEA#: [id number] Phone #: [phone] License #: [id number] Patient Address: [address], [place] [address] [place], [place] [address] [phone] Allergies No Known Drug Allergies Provider's Orders Dermatology - ICD10: J67.341 Signature(s): Date(s): Electronic Signature(s) Signed: 09/30/2019 5:29:05 PM By: Krystal Ham MD Signed:  10/01/2019 8:57:53 AM By: Carlene Coria RN Entered By: Carlene Coria on 09/30/2019 08:20:00 --------------------------------------------------------------------------------  Problem List Details Patient Name: Date of Service: Krystal Little 09/30/2019 7:30 AM Medical Record [id number] Patient Account Number: [id number] Date  of Birth/Sex: Treating RN: Mar 29, 1956 (64 y.o. Krystal Little Primary Care Provider: Stark Little Other Clinician: Referring Provider: Treating Provider/Extender:Sherrise Liberto, Krystal Little, Krystal Little in Treatment: 4 Active Problems ICD-10 Evaluated Encounter Code Description Active Date Today Diagnosis L97.212 Non-pressure chronic ulcer of right calf with fat layer 09/02/2019 No Yes exposed I87.332 Chronic venous hypertension (idiopathic) with ulcer 09/02/2019 No Yes and inflammation of left lower extremity E11.622 Type 2 diabetes mellitus with other skin ulcer 09/02/2019 No Yes Inactive Problems Resolved Problems Electronic Signature(s) Signed: 09/30/2019 5:29:05 PM By: Krystal Ham MD Entered By: Krystal Little on 09/30/2019 08:35:44 -------------------------------------------------------------------------------- Progress Note Details Patient Name: Date of Service: Krystal Little 09/30/2019 7:30 AM Medical Record [id number] Patient Account Number: [id number] Date of Birth/Sex: Treating RN: June 28, 1956 (64 y.o. Krystal Little Primary Care Provider: Stark Little Other Clinician: Referring Provider: Treating Provider/Extender:Marcin Holte, Krystal Little, Krystal Little in Treatment: 4 Subjective History of Present Illness (HPI) ADMISSION 09/02/2019 This is a 64 year old woman who is a poorly controlled type II diabetic with a recent hemoglobin A1c of 12.2. She tells [place] that she developed a wound on her right medial calf sometime in November. She is not exactly sure how this happened. She wonders if she got bit by something. She went  to the ER at the end of December and they felt this was an abscess. They gave her doxycycline they did not culture this and she used topical Neosporin. At that point it was apparently draining. On 08/18/2019 she saw her primary physician at family practice. Correctly noted that this was probably venous insufficiency but listed in the differential for an inflammatory ulcer of uncertain etiology and wondered about a biopsy. They gave her an additional 10 days of doxycycline and she has been using triple antibiotics. She says the wound actually has been getting larger. Past medical history includes type 2 diabetes uncontrolled, chronic atrial fibrillation on Coumadin, status post mitral valve replacement, pacemaker, hyperlipidemia and hypertension. ABIs in our clinic were 0.80 on the right however the patient's pulses in her foot at both the dorsalis pedis and posterior tibial are vibrant 2/23; patient wound actually looks somewhat better. Still several open areas. Debris on the surface. We have been using Iodoflex under compression. 3/2; patient has an odd wound on the medial part of her mid calf. Some surrounding inflammation which could be venous inflammation. The wound is larger and deeper today. The patient had a reflux studies. She has greater saphenous vein reflux at the saphenofemoral junction as well as small saphenous vein reflux in the proximal calf and mid calf. There is no evidence of a DVT from the common femoral to the popliteal veins. I have referred her to vascular surgery for review of the reflux vis--vis the wound area on the left medial calf As the wound did not progress on a trajectory I went back and discussed biopsying this area with the patient which was one of the reasons her primary doctor sent her here. I told her the issue here would did make sure this is not an atypical ulcer either malignant or other types of inflammatory ulcers. In any case she refused. I told her that  we would bring this up again next week. 3/9; continued odd looking wound on the medial part of her right calf. Some surrounding inflammation which could be venous inflammation. The wound itself has open areas but also nonadherent skin with connecting small holes. I told her I wanted to biopsy this last week and she refused. Today  I told her I wanted to remove some of the overhanging skin connecting the small holes to allow better dressing of the actual wound area. She also refused this. Finally she has significant reflux involving the small saphenous vein in the proximal calf and mid calf. I would like vascular surgery to review this for possible ablation but I do not have an appointment yet 3/16; odd looking wound on the medial part of her right calf surrounding inflammation which could be venous inflammation. We have been using silver alginate under compression but really making no headway here. She has some nonadherent surface epithelium with small holes in it. I do not think this is really viable but she has not let me either debride this or biopsy it. She brings up today that she was actually in this clinic in 2015 with wounds on the left leg although I have no information on this. She was apparently treated with a skin substitute with healing. Even if that were true I would need to debride this. She sees vein and vascular on 3/23 Objective Constitutional Sitting or standing Blood Pressure is within target range for patient.. Pulse regular and within target range for patient.Marland Kitchen Respirations regular, non-labored and within target range.. Temperature is normal and within the target range for the patient.Marland Kitchen Appears in no distress. Vitals Time Taken: 7:40 AM, Height: 64 in, Weight: 140 lbs, BMI: 24, Temperature: 98.3 F, Pulse: 74 bpm, Respiratory Rate: 18 breaths/min, Blood Pressure: 137/69 mmHg. Cardiovascular Pedal pulses are palpable. Edema is not present. Very little evidence of chronic  venous insufficiency. Little Notes: Wound exam; no real change in this. Regular wound with nonviable surface. Posteriorly nonadherent epithelium small holes in this which probably connect. I think this area needs to be debrided and biopsied however she has not given me permission to do this. There is surrounding inflammation but no real tenderness Integumentary (Hair, Skin) Wound #3 status is Open. Original cause of wound was Bump. The wound is located on the Right,Medial Lower Leg. The wound measures 4.6cm length x 3cm width x 0.5cm depth; 10.838cm^2 area and 5.419cm^3 volume. There is Fat Layer (Subcutaneous Tissue) Exposed exposed. There is no tunneling or undermining noted. There is a medium amount of serosanguineous drainage noted. The wound margin is distinct with the outline attached to the wound base. There is large (67-100%) red, pink granulation within the wound bed. There is a small (1-33%) amount of necrotic tissue within the wound bed including Adherent Slough. 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 There was a Three Layer Compression Therapy Procedure by Carlene Coria, RN. Post procedure Diagnosis Wound #3: Same as Pre-Procedure Plan Follow-up Appointments: Return Appointment in 1 week. Dressing Change Frequency: Do not change entire dressing for one week. Skin Barriers/Peri-Wound Care: TCA Cream or Ointment Wound Cleansing: May shower with protection. Primary Wound Dressing: Wound #3 Right,Medial Lower Leg: Calcium Alginate with Silver Secondary Dressing: Wound #3 Right,Medial Lower Leg: Dry Gauze ABD pad Edema Control: 3 Layer Compression System -  Right Lower Extremity Consults ordered were: Dermatology 1. At this point I am continuing with the same dressing. 2. Sees vein and vascular with regards to the reflux studies on 3/23 3. At this point I cannot really  do anything with this as the patient simply will not give me permission. I think it probably needs some debridement and a punch biopsy. She will not give me permission to that for this. 4. Was brought in today the week she had wounds on her left leg in 2015 and actually was seen in this clinic. Patient states that she had some form of skin substitute but the wound she has currently would not be eligible for a skin substitute without debridement. I will make her a dermatology appointment to see what they think of this. By the time we get this appointment she will of already seen vascular surgery Electronic Signature(s) Signed: 09/30/2019 5:29:05 PM By: Krystal Ham MD Entered By: Krystal Little on 09/30/2019 08:40:37 -------------------------------------------------------------------------------- SuperBill Details Patient Name: Date of Service: Krystal Little 09/30/2019 Medical Record [id number] Patient Account Number: [id number] Date of Birth/Sex: Treating RN: Aug 03, 1955 (64 y.o. Krystal Little Primary Care Provider: Stark Little Other Clinician: Referring Provider: Treating Provider/Extender:Danyl Deems, Krystal Little, Krystal Little in Treatment: 4 Diagnosis Coding ICD-10 Codes Code Description [phone] Non-pressure chronic ulcer of right calf with fat layer exposed I87.332 Chronic venous hypertension (idiopathic) with ulcer and inflammation of left lower extremity E11.622 Type 2 diabetes mellitus with other skin ulcer Facility Procedures CPT4 Code Description: [id number] (Facility Use Only) [id number] - [facility] COMPRS LWR RT LEG Modifier: Quantity: 1 Physician Procedures CPT4 Code Description: [id number] [facility] - WC PHYS LEVEL 3 - EST PT ICD-10 Diagnosis  Description L97.212 Non-pressure chronic ulcer of right calf with fat layer ex I87.332 Chronic venous hypertension (idiopathic) with ulcer and in extremity E11.622 Type 2  diabetes mellitus with other skin ulcer Modifier: posed flammation of lef Quantity: 1 t lower Electronic Signature(s) Signed: 09/30/2019 5:29:05 PM By: Krystal Ham MD Entered By: Krystal Little on 09/30/2019 08:40:56

## 2019-10-01 NOTE — Progress Notes (Signed)
Krystal, Little ([id number]) Visit Report for 09/30/2019 Arrival Information Details Patient Name: Date of Service: Krystal, Little 09/30/2019 7:30 AM Medical Record [id number] Patient Account Number: [id number] Date of Birth/Sex: Treating RN: 09-19-1955 (64 y.o. Krystal Little Primary Care Maigan Bittinger: Stark Klein Other Clinician: Referring Bina Veenstra: Treating Sindi Beckworth/Extender:Robson, Kathee Delton, MAKIERA Weeks in Treatment: 4 Visit Information History Since Last Visit Added or deleted any medications: No Patient Arrived: Ambulatory Any new allergies or adverse reactions: No Arrival Time: 07:43 Had a fall or experienced change in No Accompanied By: self activities of daily living that may affect Transfer Assistance: None risk of falls: Patient Identification Verified: Yes Signs or symptoms of abuse/neglect since last No Secondary Verification Process Yes visito Completed: Hospitalized since last visit: No Patient Requires Transmission- No Implantable device outside of the clinic excluding No Based Precautions: cellular tissue based products placed in the center Patient Has Alerts: Yes since last visit: Patient Alerts: Patient on Blood Has Dressing in Place as Prescribed: Yes Thinner Has Compression in Place as Prescribed: Yes Pain Present Now: No Electronic Signature(s) Signed: 09/30/2019 5:07:08 PM By: Kela Millin Entered By: Kela Millin on 09/30/2019 07:43:52 -------------------------------------------------------------------------------- Compression Therapy Details Patient Name: Date of Service: Krystal Little 09/30/2019 7:30 AM Medical Record [id number] Patient Account Number: [id number] Date of Birth/Sex: Treating RN: Dec 18, 1955 (64 y.o. Krystal Little Primary Care Andris Brothers: Stark Klein Other Clinician: Referring Layne Dilauro: Treating Littleton Haub/Extender:Robson, Kathee Delton, MAKIERA Weeks in Treatment:  4 Compression Therapy Performed for Wound Wound #3 Right,Medial Lower Leg Assessment: Performed By: Clinician Carlene Coria, RN Compression Type: Three Layer Post Procedure Diagnosis Same as Pre-procedure Electronic Signature(s) Signed: 10/01/2019 8:57:53 AM By: Carlene Coria RN Entered By: Carlene Coria on 09/30/2019 08:27:06 -------------------------------------------------------------------------------- Encounter Discharge Information Details Patient Name: Date of Service: Krystal Little 09/30/2019 7:30 AM Medical Record [id number] Patient Account Number: [id number] Date of Birth/Sex: Treating RN: [birth year] (64 y.o. Krystal Little Primary Care Makenly Larabee: Stark Klein Other Clinician: Referring Jamaria Amborn: Treating Kentley Cedillo/Extender:Robson, Kathee Delton, MAKIERA Weeks in Treatment: 4 Encounter Discharge Information Items Discharge Condition: Stable Ambulatory Status: Ambulatory Discharge Destination: Home Transportation: Private Auto Accompanied By: self Schedule Follow-up Appointment: Yes Clinical Summary of Care: Patient Declined Electronic Signature(s) Signed: 09/30/2019 5:07:08 PM By: Kela Millin Entered By: Kela Millin on 09/30/2019 08:41:39 -------------------------------------------------------------------------------- Lower Extremity Assessment Details Patient Name: Date of Service: Krystal, Little 09/30/2019 7:30 AM Medical Record [id number] Patient Account Number: [id number] Date of Birth/Sex: Treating RN: 1956-05-03 (64 y.o. Krystal Little Primary Care Torrin Frein: Stark Klein Other Clinician: Referring Ceria Suminski: Treating Krystal Little/Extender:Robson, Kathee Delton, MAKIERA Weeks in Treatment: 4 Edema Assessment Assessed: [Left: No] [Right: No] Edema: [Left: N] [Right: o] Calf Left: Right: Point of Measurement: 26 cm From Medial Instep cm 30.8 cm Ankle Left: Right: Point of Measurement: 11 cm From Medial Instep cm  19.5 cm Vascular Assessment Pulses: Dorsalis Pedis Palpable: [Right:Yes] Electronic Signature(s) Signed: 09/30/2019 5:07:08 PM By: Kela Millin Entered By: Kela Millin on 09/30/2019 07:50:03 -------------------------------------------------------------------------------- Multi Wound Chart Details Patient Name: Date of Service: Krystal Little 09/30/2019 7:30 AM Medical Record [id number] Patient Account Number: [id number] Date of Birth/Sex: Treating RN: August 16, 1955 (64 y.o. 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] [N/A:N/A] Comorbid History: [3:Arrhythmia, Deep Vein Thrombosis, Type II Diabetes] [N/A:N/A] Date Acquired: [3:05/18/2019] [N/A:N/A] Weeks of Treatment: [3:4] [N/A:N/A] Wound Status: [3:Open] [N/A:N/A] Clustered Wound: [3:Yes] [N/A:N/A] Clustered Quantity: [3:3] [N/A:N/A] Measurements L x W x D 4.6x3x0.5 [N/A:N/A] (cm) Area (cm) : [3:10.838] [N/A:N/A] Volume (cm) : [3:5.419] [N/A:N/A] % Reduction in Area: [3:-31.40%] [N/A:N/A] % Reduction in Volume: [3:-556.80%] [N/A:N/A] Classification: [3:Full Thickness Without Exposed Support Structures] [N/A:N/A] Exudate Amount: [3:Medium] [N/A:N/A] Exudate Type: [3:Serosanguineous] [N/A:N/A] Exudate Color: [3:red, brown] [N/A:N/A] Wound Margin: [3:Distinct, outline attached N/A] Granulation Amount: [3:Large (67-100%)] [N/A:N/A] Granulation Quality: [3:Red, Pink] [N/A:N/A] Necrotic Amount: [3:Small (1-33%)] [N/A:N/A] Exposed Structures: [3:Fat  Layer (Subcutaneous N/A Tissue) Exposed: Yes Fascia: No Tendon: No Muscle: No Joint: No Bone: No] Epithelialization: [3:Medium (34-66%) Compression Therapy] [N/A:N/A N/A] Treatment Notes Electronic Signature(s) Signed: 09/30/2019 5:29:05 PM By: Linton Ham MD Signed: 10/01/2019 8:57:53 AM By: Carlene Coria RN Entered By: Linton Ham on 09/30/2019 08:35:51 -------------------------------------------------------------------------------- Multi-Disciplinary Care Plan Details Patient Name: Date of Service: Krystal Little 09/30/2019 7:30 AM Medical Record [id number] Patient Account Number: [id number] Date of Birth/Sex: Treating RN: 01/03/1956 (64 y.o. Krystal Little Primary Care Marsden Zaino: Stark Klein Other Clinician: Referring Verlisa Vara: Treating Kayle Passarelli/Extender:Robson, Kathee Delton, MAKIERA Weeks in Treatment: 4 Active Inactive Wound/Skin Impairment Nursing Diagnoses: Knowledge deficit related to ulceration/compromised skin integrity Goals: Patient/caregiver will verbalize understanding of skin care regimen Date Initiated: 09/02/2019 Target Resolution Date: 10/31/2019 Goal Status: Active Ulcer/skin breakdown will have a volume reduction of 30% by week 4 Date Initiated: 09/02/2019 Date Inactivated: 09/30/2019 Target Resolution Date: 09/30/2019 Unmet Reason: comorbities, Goal Status: Unmet pt declines biopsy, debridment Ulcer/skin breakdown will have a volume reduction of 50% by week 8 Date Initiated: 09/30/2019 Target Resolution Date: 10/31/2019 Goal Status: Active Interventions: Assess patient/caregiver ability to obtain necessary supplies Assess patient/caregiver ability to perform ulcer/skin care regimen upon admission and as needed Assess ulceration(s) every visit Notes: Electronic Signature(s) Signed: 10/01/2019 8:57:53 AM By: Carlene Coria RN Entered By: Carlene Coria on 09/30/2019  08:16:13 -------------------------------------------------------------------------------- Pain Assessment Details Patient Name: Date of Service: Krystal, Little 09/30/2019 7:30 AM Medical Record [id number] Patient Account Number: [id number] Date of Birth/Sex: Treating RN: May 22, 1956 (64 y.o. Krystal Little Primary Care Deniss Wormley: Stark Klein Other Clinician: Referring Damoni Erker: Treating Maximilien Hayashi/Extender:Robson, Kathee Delton, MAKIERA Weeks in Treatment: 4 Active Problems Location of Pain Severity and Description of Pain Patient Has Paino No Site Locations Pain Management and Medication Current Pain Management: Electronic Signature(s) Signed: 09/30/2019 5:07:08 PM By: Kela Millin Entered By: Kela Millin on 09/30/2019 07:49:52 -------------------------------------------------------------------------------- Patient/Caregiver Education Details Patient Name: Date of Service: Krystal Little 3/16/2021andnbsp7:30 AM Medical Record [id number] Patient Account Number: [id number] Date of Birth/Gender: Treating RN: 26-Oct-1955 (64 y.o. Krystal Little Primary Care Physician: Stark Klein Other Clinician: Referring Physician: Treating Physician/Extender:Robson, Kathee Delton, Danise Mina in Treatment: 4 Education Assessment Education Provided To: Patient Education Topics Provided Wound/Skin Impairment: Methods: Explain/Verbal Responses: State content correctly Electronic Signature(s) Signed: 10/01/2019 8:57:53 AM By: Carlene Coria RN Entered By: Carlene Coria on 09/30/2019 07:48:28 -------------------------------------------------------------------------------- Wound Assessment Details Patient Name: Date of Service: Krystal, Little 09/30/2019 7:30 AM Medical Record [id number] Patient Account Number: [id number] Date of Birth/Sex: Treating RN: 1956/07/11 (64 y.o. Krystal Little Primary Care Tzirel Leonor: Stark Klein Other  Clinician: Referring Rynlee Lisbon: Treating Donnia Poplaski/Extender:Robson, Kathee Delton, MAKIERA Weeks in Treatment: 4 Wound Status Wound Number: 3 Primary Venous Leg Ulcer Etiology: Wound Location: Right Lower Leg - Medial Wound Open Wounding Event: Bump Status: Date Acquired: 05/18/2019 Comorbid Arrhythmia, Deep Vein Thrombosis, Type  Weeks Of Treatment: 4 History: II Diabetes Clustered Wound: Yes Wound Measurements Length: (cm) 4.6 % Reducti Width: (cm) 3 % Reducti Depth: (cm) 0.5 Epithelia Clustered Quantity: 3 Tunneli Area: (cm) 10.838 Underm Volume: (cm) 5.419 Wound Description Full Thickness Without Exposed Support Foul Od Classification: Structures Slough/ Wound Distinct, outline attached Margin: Exudate Medium Amount: Exudate Serosanguineous Type: Exudate red, brown Color: Wound Bed Granulation Amount: Large (67-100%) Granulation Quality: Red, Pink Fascia Necrotic Amount: Small (1-33%) Fat Lay Necrotic Quality: Adherent Slough Tendon Muscle Joint E Bone Ex or After Cleansing: No Fibrino Yes Exposed Structure Exposed: No er (Subcutaneous Tissue) Exposed: Yes Exposed: No Exposed: No xposed: No posed: No on in Area: -31.4% on in Volume: -556.8% lization: Medium (34-66%) ng: No ining: No Treatment Notes Wound #3 (Right, Medial Lower Leg) 1. Cleanse With Wound Cleanser Soap and water 2. Periwound Care Moisturizing lotion TCA Cream 3. Primary Dressing Applied Calcium Alginate Ag 4. Secondary Dressing ABD Pad Dry Gauze 6. Support Layer Applied 3 layer compression wrap Notes netting Electronic Signature(s) Signed: 09/30/2019 5:07:08 PM By: Kela Millin Entered By: Kela Millin on 09/30/2019 07:56:27 -------------------------------------------------------------------------------- Vitals Details Patient Name: Date of Service: Krystal Little 09/30/2019 7:30 AM Medical Record [id number] Patient Account Number:  [id number] Date of Birth/Sex: Treating RN: 06-11-1956 (64 y.o. Krystal Little Primary Care Sadarius Norman: Stark Klein Other Clinician: Referring Parminder Trapani: Treating Dois Juarbe/Extender:Robson, Kathee Delton, MAKIERA Weeks in Treatment: 4 Vital Signs Time Taken: 07:40 Temperature (F): 98.3 Height (in): 64 Pulse (bpm): 74 Weight (lbs): 140 Respiratory Rate (breaths/min): 18 Body Mass Index (BMI): 24 Blood Pressure (mmHg): 137/69 Reference Range: 80 - 120 mg / dl Electronic Signature(s) Signed: 09/30/2019 5:07:08 PM By: Kela Millin Entered By: Kela Millin on 09/30/2019 07:46:19

## 2019-10-07 ENCOUNTER — Other Ambulatory Visit: Payer: Self-pay

## 2019-10-07 ENCOUNTER — Ambulatory Visit: Payer: Medicare Other | Admitting: Vascular Surgery

## 2019-10-07 ENCOUNTER — Encounter: Payer: Self-pay | Admitting: Vascular Surgery

## 2019-10-07 VITALS — BP 137/79 | HR 71 | Temp 97.9°F | Resp 16 | Ht 64.0 in | Wt 141.0 lb

## 2019-10-07 DIAGNOSIS — I872 Venous insufficiency (chronic) (peripheral): Secondary | ICD-10-CM

## 2019-10-07 DIAGNOSIS — L02415 Cutaneous abscess of right lower limb: Secondary | ICD-10-CM | POA: Diagnosis not present

## 2019-10-07 DIAGNOSIS — L03115 Cellulitis of right lower limb: Secondary | ICD-10-CM

## 2019-10-07 NOTE — Progress Notes (Signed)
Patient name: Krystal Little MRN: [id number] DOB: 08-22-1955 Sex: female  REASON FOR CONSULT: Evaluate right lower extremity ulceration  HPI: Krystal Little is a 64 y.o. female, with history of atrial fibrillation, diabetes, history of mitral valve repair, sick sinus syndrome status post cardiac pacemaker placement that presents for evaluation of right lower extremity wound.  Patient states this wound has been present for several months and started as a small scab.  Ultimately she is now going to the wound center and is in an [employer] and feels that this is healing over the last 3 to 4 weeks.  She denies any previous lower extremity interventions.  She has no pain in the foot or calf at night or walking.  Does not smoke.  No history of DVT or other trauma to the leg.  Past Medical History:  Diagnosis Date  . Chronic atrial fibrillation ([place]) 1990s   s/p DCCV then attempted ablation of complex A Flutter (@ [facility] - Dr. Deno Etienne), failed antiarrhythmics --> INR folllwed @ [facility] ->> now status post pacemaker placement with underlying A. fib.  . Diabetes mellitus type 2, uncontrolled, without complications    On Oral Medications ([facility])  . Essential hypertension   . History of cardiac catheterization 2001   R&LHC - normal Coronaries, no evidence of Restictive Cardiomyopathy or Constrictive Pericarditis (also @ [facility])  . Hx of sick sinus syndrome 07/1999   Wtih symptomatic bradycardia - syncope (Tachy-Brady)  . 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 CARDIAC  CATHETERIZATION  08/03/1999   [place] - normal Coronaries; no sign of constriction or restrictive cardiomypathy  . MITRAL VALVE REPAIR  07/1999   Both Ant& Post leaflet repair - quadrangular resection&caudal transposition, #32 Sequin Annuloplasty ring  . PACEMAKER GENERATOR CHANGE  11/26/2008   Medtronic Adapta L(? if it has been checked since 02/2013)  . PACEMAKER INSERTION  07/1999   for SSS in setting of Chronic Afib; Medtronic Kappa [id number], [id number] H.  . TRANSTHORACIC ECHOCARDIOGRAM  05/2018   Normal LV size and thickness.  EF estimated 50%.  Inferobasal HK and flattened septum c/w with elevated PA pressures. Mild functional mitral stenosis at rest (postoperative).  Moderate left atrial dilation and mild right atrial dilation.  Mean PA pressures estimated 52 mmHg.    Family History  Problem Relation Age of Onset  . Hypertension Mother   . Diabetes Sister   . Cancer Brother        Rectal  . Lung cancer Brother     SOCIAL HISTORY: Social History   Socioeconomic History  . Marital status: Married    Spouse name: Riki Rusk   . Number of children: 2  . Years of education: 16 years.  . Highest education level: Not on file  Occupational History  . Occupation: product development    Employer: [employer]    Comment: prev worked as Education officer, environmental   Tobacco Use  . Smoking status: Never Smoker  . Smokeless tobacco: Never Used  Substance and Sexual Activity  .  Alcohol use: Yes    Alcohol/week: 0.0 standard drinks    Comment: occasional, once every month or two- glass of wine   . Drug use: No  . Sexual activity: Not Currently    Partners: Male    Comment: 1st intercourse 47 yo-5 partners  Other Topics Concern  . Not on file  Social History Narrative   Lives with husband, great-niece (81)  and great-nephew (27).    Daughter 21  yo 4th year college basketball player in [place].   1 son died in an automobile accident @ age 55 (2010).    She  raised several nieces & nephews - now lives with her 46 y/o daughter (who recently moved back in)   Routinely walks 2-3 days/week.   Started new job through the [employer].   Social Determinants of Health   Financial Resource Strain:   . Difficulty of Paying Living Expenses:   Food Insecurity:   . Worried About Charity fundraiser in the Last Year:   . Arboriculturist in the Last Year:   Transportation Needs:   . Film/video editor (Medical):   Marland Kitchen Lack of Transportation (Non-Medical):   Physical Activity:   . Days of Exercise per Week:   . Minutes of Exercise per Session:   Stress:   . Feeling of Stress :   Social Connections:   . Frequency of Communication with Friends and Family:   . Frequency of Social Gatherings with Friends and Family:   . Attends Religious Services:   . Active Member of Clubs or Organizations:   . Attends Archivist Meetings:   Marland Kitchen Marital Status:   Intimate Partner Violence:   . Fear of Current or Ex-Partner:   . Emotionally Abused:   Marland Kitchen Physically Abused:   . Sexually Abused:     No Known Allergies  Current Outpatient Medications  Medication Sig Dispense Refill  . atenolol (TENORMIN) 50 MG tablet TAKE 1 TABLET(50 MG) BY MOUTH DAILY 90 tablet 0  . diltiazem (CARDIZEM CD) 180 MG 24 hr capsule TAKE 1 CAPSULE(180 MG) BY MOUTH DAILY 90 capsule 2  . lisinopril (ZESTRIL) 5 MG tablet TAKE 1 TABLET(5 MG) BY MOUTH DAILY 90 tablet 0  . metFORMIN (GLUCOPHAGE-XR) 500 MG 24 hr tablet Take 500 mg by mouth 2 (two) times daily.   2  . warfarin (COUMADIN) 5 MG tablet TAKE 1 TABLET BY MOUTH DAILY EXCEPT TAKE 1 AND 1/2 TABLET ON MONDAY AND FRIDAY 45 tablet 3  . doxycycline (VIBRA-TABS) 100 MG tablet Take 1 tablet (100 mg total) by mouth 2 (two) times daily. (Patient not taking: Reported on 10/07/2019) 20 tablet 0  . ezetimibe (ZETIA) 10 MG tablet Take 1 tablet (10 mg total) by mouth daily. 90 tablet 3   No current facility-administered medications for this  visit.    REVIEW OF SYSTEMS:  [X]  denotes positive finding, [ ]  denotes negative finding Cardiac  Comments:  Chest pain or chest pressure:    Shortness of breath upon exertion:    Short of breath when lying flat:    Irregular heart rhythm:        Vascular    Pain in calf, thigh, or hip brought on by ambulation:    Pain in feet at night that wakes you up from your sleep:     Blood clot in your veins:    Leg swelling:         Pulmonary    Oxygen  at home:    Productive cough:     Wheezing:         Neurologic    Sudden weakness in arms or legs:     Sudden numbness in arms or legs:     Sudden onset of difficulty speaking or slurred speech:    Temporary loss of vision in one eye:     Problems with dizziness:         Gastrointestinal    Blood in stool:     Vomited blood:         Genitourinary    Burning when urinating:     Blood in urine:        Psychiatric    Major depression:         Hematologic    Bleeding problems:    Problems with blood clotting too easily:        Skin    Rashes or ulcers:        Constitutional    Fever or chills:      PHYSICAL EXAM: Vitals:   10/07/19 0855  BP: 137/79  Pulse: 71  Resp: 16  Temp: 97.9 F (36.6 C)  TempSrc: Temporal  SpO2: 99%  Weight: 141 lb (64 kg)  Height: 5\' 4"  (1.626 m)    GENERAL: The patient is a well-nourished female, in no acute distress. The vital signs are documented above. CARDIAC: There is a regular rate and rhythm.  VASCULAR:  Palpable femoral pulses bilaterally Palpable dorsalis pedis pulses bilaterally No distal ulcerations on the toes Right medial ulcerated calf wound as pictured below PULMONARY: There is good air exchange bilaterally without wheezing or rales. ABDOMEN: Soft and non-tender with normal pitched bowel sounds.  MUSCULOSKELETAL: There are no major deformities or cyanosis. NEUROLOGIC: No focal weakness or paresthesias are detected. SKIN: There are no ulcers or rashes  noted. PSYCHIATRIC: The patient has a normal affect.      DATA:   I independently reviewed her right lower extremity reflux study from 09/16/2019 and she has evidence of pathologic reflux in the right common femoral vein as well as short segment at the great saphenous vein at the saphenofemoral junction and the small saphenous vein.  Assessment/Plan:  64 year old female presents with ulceration to the right lower extremity with underlying venous insufficiency.  Her CEAP classification would be C6 given active ulceration.  Discussed with patient that I do not think she has any underlying arterial disease given a easily palpable dorsalis pedis and posterior tibial pulse on exam.  Discussed for her venous insufficiency would recommend that she stays in an [place] for now until this has completely healed and she is currently going to the wound center.  We measured her for thigh-high compression today and would recommend she transition to thigh-high compression with 20 to 30 mmHg compression once the ulceration heals and then she can come out of the [employer].  I will arrange follow-up in 3 months to see Dr. Scot Dock or Dr. Oneida Alar to see if she would be a candidate for a small saphenous vein ablation.  I discussed with her that the small saphenous vein is on the smaller side distally in the calf and she may not be a candidate for any intervention but certainly would be worth another evaluation here in 3 months given the location of her ulceration.   Marty Heck, MD [facility] Office: [phone]

## 2019-10-08 ENCOUNTER — Encounter (HOSPITAL_BASED_OUTPATIENT_CLINIC_OR_DEPARTMENT_OTHER): Payer: Medicare Other | Admitting: Physician Assistant

## 2019-10-08 DIAGNOSIS — I87332 Chronic venous hypertension (idiopathic) with ulcer and inflammation of left lower extremity: Secondary | ICD-10-CM | POA: Diagnosis not present

## 2019-10-08 DIAGNOSIS — Z950 Presence of cardiac pacemaker: Secondary | ICD-10-CM | POA: Diagnosis not present

## 2019-10-08 DIAGNOSIS — Z86718 Personal history of other venous thrombosis and embolism: Secondary | ICD-10-CM | POA: Diagnosis not present

## 2019-10-08 DIAGNOSIS — I10 Essential (primary) hypertension: Secondary | ICD-10-CM | POA: Diagnosis not present

## 2019-10-08 DIAGNOSIS — Z952 Presence of prosthetic heart valve: Secondary | ICD-10-CM | POA: Diagnosis not present

## 2019-10-08 DIAGNOSIS — I482 Chronic atrial fibrillation, unspecified: Secondary | ICD-10-CM | POA: Diagnosis not present

## 2019-10-08 DIAGNOSIS — E11622 Type 2 diabetes mellitus with other skin ulcer: Secondary | ICD-10-CM | POA: Diagnosis not present

## 2019-10-08 DIAGNOSIS — L97812 Non-pressure chronic ulcer of other part of right lower leg with fat layer exposed: Secondary | ICD-10-CM | POA: Diagnosis not present

## 2019-10-08 DIAGNOSIS — Z7901 Long term (current) use of anticoagulants: Secondary | ICD-10-CM | POA: Diagnosis not present

## 2019-10-08 DIAGNOSIS — L97212 Non-pressure chronic ulcer of right calf with fat layer exposed: Secondary | ICD-10-CM | POA: Diagnosis not present

## 2019-10-08 DIAGNOSIS — E785 Hyperlipidemia, unspecified: Secondary | ICD-10-CM | POA: Diagnosis not present

## 2019-10-08 NOTE — Progress Notes (Addendum)
Krystal Little, Krystal Little ([id number]) Visit Report for 10/08/2019 Chief Complaint Document Details Patient Name: Date of Service: Krystal Little, Krystal Little 10/08/2019 7:30 AM Medical Record [id number] Patient Account Number: [id number] Date of Birth/Sex: Treating RN: [birth year] (64 y.o. [name] Primary Care Provider: Stark Klein Other Clinician: Referring Provider: Treating Provider/Extender:Stone III, Lelon Huh, MAKIERA Weeks in Treatment: 5 Information Obtained from: Patient Chief Complaint Right LE Ulcer Electronic Signature(s) Signed: 10/08/2019 7:33:46 AM By: Worthy Keeler PA-C Previous Signature: 10/08/2019 7:33:28 AM Version By: Worthy Keeler PA-C Entered By: Worthy Keeler on 10/08/2019 07:33:46 -------------------------------------------------------------------------------- HPI Details Patient Name: Date of Service: Krystal Little 10/08/2019 7:30 AM Medical Record [id number] Patient Account Number: [id number] Date of Birth/Sex: Treating RN: [birth year] (64 y.o. [name] Primary Care Provider: Stark Klein Other Clinician: Referring Provider: Treating Provider/Extender:Stone III, Lelon Huh, MAKIERA Weeks in Treatment: 5 History of Present Illness HPI Description: ADMISSION 09/02/2019 This is a 64 year old woman who is a poorly controlled type II diabetic with a recent hemoglobin A1c of 12.2. She tells [place] that she developed a wound on her right medial calf sometime in November. She is not exactly sure how this happened. She wonders if she got bit by something. She went to the ER at the end of December and they felt this was an abscess. They gave her doxycycline they did not culture this and she used topical Neosporin. At that point it was apparently draining. On 08/18/2019 she saw her primary physician at family practice. Correctly noted that this was probably venous insufficiency but listed in the differential for an inflammatory ulcer of  uncertain etiology and wondered about a biopsy. They gave her an additional 10 days of doxycycline and she has been using triple antibiotics. She says the wound actually has been getting larger. Past medical history includes type 2 diabetes uncontrolled, chronic atrial fibrillation on Coumadin, status post mitral valve replacement, pacemaker, hyperlipidemia and hypertension. ABIs in our clinic were 0.80 on the right however the patient's pulses in her foot at both the dorsalis pedis and posterior tibial are vibrant 2/23; patient wound actually looks somewhat better. Still several open areas. Debris on the surface. We have been using Iodoflex under compression. 3/2; patient has an odd wound on the medial part of her mid calf. Some surrounding inflammation which could be venous inflammation. The wound is larger and deeper today. The patient had a reflux studies. She has greater saphenous vein reflux at the saphenofemoral junction as well as small saphenous vein reflux in the proximal calf and mid calf. There is no evidence of a DVT from the common femoral to the popliteal veins. I have referred her to vascular surgery for review of the reflux vis--vis the wound area on the left medial calf As the wound did not progress on a trajectory I went back and discussed biopsying this area with the patient which was one of the reasons her primary doctor sent her here. I told her the issue here would did make sure this is not an atypical ulcer either malignant or other types of inflammatory ulcers. In any case she refused. I told her that we would bring this up again next week. 3/9; continued odd looking wound on the medial part of her right calf. Some surrounding inflammation which could be venous inflammation. The wound itself has open areas but also nonadherent skin with connecting small holes. I told her I wanted to biopsy this last week and she refused. Today I told her I  wanted to remove some of  the overhanging skin connecting the small holes to allow better dressing of the actual wound area. She also refused this. Finally she has significant reflux involving the small saphenous vein in the proximal calf and mid calf. I would like vascular surgery to review this for possible ablation but I do not have an appointment yet 3/16; odd looking wound on the medial part of her right calf surrounding inflammation which could be venous inflammation. We have been using silver alginate under compression but really making no headway here. She has some nonadherent surface epithelium with small holes in it. I do not think this is really viable but she has not let me either debride this or biopsy it. She brings up today that she was actually in this clinic in 2015 with wounds on the left leg although I have no information on this. She was apparently treated with a skin substitute with healing. Even if that were true I would need to debride this. She sees vein and vascular on 3/23 10/08/2019 upon evaluation today patient appears to be doing very well with regard to her ulcer on the lower extremity. She has been tolerating the dressing changes without complication. In fact this seems to be making excellent progress at this time. No fevers, chills, nausea, vomiting, or diarrhea. Electronic Signature(s) Signed: 10/08/2019 8:26:17 AM By: Worthy Keeler PA-C Entered By: Worthy Keeler on 10/08/2019 08:26:17 -------------------------------------------------------------------------------- Physical Exam Details Patient Name: Date of Service: Krystal Little, Krystal Little 10/08/2019 7:30 AM Medical Record [id number] Patient Account Number: [id number] Date of Birth/Sex: Treating RN: 1956-02-07 (64 y.o. [name] Primary Care Provider: Stark Klein Other Clinician: Referring Provider: Treating Provider/Extender:Stone III, Lelon Huh, MAKIERA Weeks in Treatment: 5 Constitutional Well-nourished and  well-hydrated in no acute distress. Respiratory normal breathing without difficulty. Psychiatric this patient is able to make decisions and demonstrates good insight into disease process. Alert and Oriented x 3. pleasant and cooperative. Notes Upon inspection patient's wound bed currently showed signs of good granulation at this time. Fortunately there is no evidence of active infection. No fevers, chills, nausea, vomiting, or diarrhea. Electronic Signature(s) Signed: 10/08/2019 8:26:34 AM By: Worthy Keeler PA-C Entered By: Worthy Keeler on 10/08/2019 08:26:34 -------------------------------------------------------------------------------- Physician Orders Details Patient Name: Date of Service: Krystal Little, Krystal Little 10/08/2019 7:30 AM Medical Record [id number] Patient Account Number: [id number] Date of Birth/Sex: Treating RN: 03/04/1956 (64 y.o. Martyn Malay, Linda Primary Care Provider: Stark Klein Other Clinician: Referring Provider: Treating Provider/Extender:Stone III, Lelon Huh, MAKIERA Weeks in Treatment: 5 Verbal / Phone Orders: No Diagnosis Coding ICD-10 Coding Code Description [id number] Non-pressure chronic ulcer of right calf with fat layer exposed I87.332 Chronic venous hypertension (idiopathic) with ulcer and inflammation of left lower extremity E11.622 Type 2 diabetes mellitus with other skin ulcer Follow-up Appointments Return Appointment in 1 week. - Tuesday Dressing Change Frequency Do not change entire dressing for one week. Skin Barriers/Peri-Wound Care Moisturizing lotion TCA Cream or Ointment - mixed with lotion Wound Cleansing May shower with protection. Primary Wound Dressing Wound #3 Right,Medial Lower Leg Calcium Alginate with Silver Wound #4 Right,Distal,Medial Lower Leg Calcium Alginate with Silver Secondary Dressing Wound #3 Right,Medial Lower Leg Dry Gauze Edema Control 3 Layer Compression System - Right Lower Extremity Avoid  standing for long periods of time Elevate legs to the level of the heart or above for 30 minutes daily and/or when sitting, a frequency of: - throughout the day Exercise regularly Electronic Signature(s) Signed: 10/08/2019 5:14:16 PM  By: Worthy Keeler PA-C Signed: 10/08/2019 5:36:50 PM By: Baruch Gouty RN, BSN Entered By: Baruch Gouty on 10/08/2019 08:24:17 -------------------------------------------------------------------------------- Problem List Details Patient Name: Date of Service: Krystal Little, Krystal Little 10/08/2019 7:30 AM Medical Record [id number] Patient Account Number: [id number] Date of Birth/Sex: Treating RN: [birth year] (64 y.o. [name] Primary Care Provider: Stark Klein Other Clinician: Referring Provider: Treating Provider/Extender:Stone III, Lelon Huh, MAKIERA Weeks in Treatment: 5 Active Problems ICD-10 Evaluated Encounter Code Description Active Date Today Diagnosis L97.212 Non-pressure chronic ulcer of right calf with fat layer 09/02/2019 No Yes exposed I87.332 Chronic venous hypertension (idiopathic) with ulcer 09/02/2019 No Yes and inflammation of left lower extremity E11.622 Type 2 diabetes mellitus with other skin ulcer 09/02/2019 No Yes Inactive Problems Resolved Problems Electronic Signature(s) Signed: 10/08/2019 7:33:22 AM By: Worthy Keeler PA-C Entered By: Worthy Keeler on 10/08/2019 07:33:22 -------------------------------------------------------------------------------- Progress Note Details Patient Name: Date of Service: Krystal Little 10/08/2019 7:30 AM Medical Record [id number] Patient Account Number: [id number] Date of Birth/Sex: Treating RN: Jul 03, 1956 (64 y.o. [name] Primary Care Provider: Stark Klein Other Clinician: Referring Provider: Treating Provider/Extender:Stone III, Lelon Huh, MAKIERA Weeks in Treatment: 5 Subjective Chief Complaint Information obtained from Patient Right LE  Ulcer History of Present Illness (HPI) ADMISSION 09/02/2019 This is a 64 year old woman who is a poorly controlled type II diabetic with a recent hemoglobin A1c of 12.2. She tells [place] that she developed a wound on her right medial calf sometime in November. She is not exactly sure how this happened. She wonders if she got bit by something. She went to the ER at the end of December and they felt this was an abscess. They gave her doxycycline they did not culture this and she used topical Neosporin. At that point it was apparently draining. On 08/18/2019 she saw her primary physician at family practice. Correctly noted that this was probably venous insufficiency but listed in the differential for an inflammatory ulcer of uncertain etiology and wondered about a biopsy. They gave her an additional 10 days of doxycycline and she has been using triple antibiotics. She says the wound actually has been getting larger. Past medical history includes type 2 diabetes uncontrolled, chronic atrial fibrillation on Coumadin, status post mitral valve replacement, pacemaker, hyperlipidemia and hypertension. ABIs in our clinic were 0.80 on the right however the patient's pulses in her foot at both the dorsalis pedis and posterior tibial are vibrant 2/23; patient wound actually looks somewhat better. Still several open areas. Debris on the surface. We have been using Iodoflex under compression. 3/2; patient has an odd wound on the medial part of her mid calf. Some surrounding inflammation which could be venous inflammation. The wound is larger and deeper today. The patient had a reflux studies. She has greater saphenous vein reflux at the saphenofemoral junction as well as small saphenous vein reflux in the proximal calf and mid calf. There is no evidence of a DVT from the common femoral to the popliteal veins. I have referred her to vascular surgery for review of the reflux vis--vis the wound area on the left  medial calf As the wound did not progress on a trajectory I went back and discussed biopsying this area with the patient which was one of the reasons her primary doctor sent her here. I told her the issue here would did make sure this is not an atypical ulcer either malignant or other types of inflammatory ulcers. In any case she refused. I told her that  we would bring this up again next week. 3/9; continued odd looking wound on the medial part of her right calf. Some surrounding inflammation which could be venous inflammation. The wound itself has open areas but also nonadherent skin with connecting small holes. I told her I wanted to biopsy this last week and she refused. Today I told her I wanted to remove some of the overhanging skin connecting the small holes to allow better dressing of the actual wound area. She also refused this. Finally she has significant reflux involving the small saphenous vein in the proximal calf and mid calf. I would like vascular surgery to review this for possible ablation but I do not have an appointment yet 3/16; odd looking wound on the medial part of her right calf surrounding inflammation which could be venous inflammation. We have been using silver alginate under compression but really making no headway here. She has some nonadherent surface epithelium with small holes in it. I do not think this is really viable but she has not let me either debride this or biopsy it. She brings up today that she was actually in this clinic in 2015 with wounds on the left leg although I have no information on this. She was apparently treated with a skin substitute with healing. Even if that were true I would need to debride this. She sees vein and vascular on 3/23 10/08/2019 upon evaluation today patient appears to be doing very well with regard to her ulcer on the lower extremity. She has been tolerating the dressing changes without complication. In fact this seems to be  making excellent progress at this time. No fevers, chills, nausea, vomiting, or diarrhea. Objective Constitutional Well-nourished and well-hydrated in no acute distress. Vitals Time Taken: 7:41 AM, Height: 64 in, Weight: 140 lbs, BMI: 24, Temperature: 98.3 F, Pulse: 76 bpm, Respiratory Rate: 18 breaths/min, Blood Pressure: 154/85 mmHg. Respiratory normal breathing without difficulty. Psychiatric this patient is able to make decisions and demonstrates good insight into disease process. Alert and Oriented x 3. pleasant and cooperative. Little Notes: Upon inspection patient's wound bed currently showed signs of good granulation at this time. Fortunately there is no evidence of active infection. No fevers, chills, nausea, vomiting, or diarrhea. Integumentary (Hair, Skin) Wound #3 status is Open. Original cause of wound was Bump. The wound is located on the Right,Medial Lower Leg. The wound measures 1cm length x 1cm width x 0.1cm depth; 0.785cm^2 area and 0.079cm^3 volume. Wound #4 status is Open. Original cause of wound was Other Lesion. The wound is located on the Right,Distal,Medial Lower Leg. The wound measures 0.6cm length x 0.4cm width x 0.1cm depth; 0.188cm^2 area and 0.019cm^3 volume. 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 There was a Three Layer Compression Therapy Procedure by Carlene Coria, RN. Post procedure Diagnosis Wound #3: Same as Pre-Procedure Plan Follow-up Appointments: Return Appointment in 1 week. - Tuesday Dressing Change Frequency: Do not change entire dressing for one week. Skin Barriers/Peri-Wound Care: Moisturizing lotion TCA Cream or Ointment  - mixed with lotion Wound Cleansing: May shower with protection. Primary Wound Dressing: Wound #3 Right,Medial Lower Leg: Calcium Alginate with Silver Wound #4 Right,Distal,Medial Lower Leg: Calcium Alginate with Silver Secondary Dressing: Wound #3  Right,Medial Lower Leg: Dry Gauze Edema Control: 3 Layer Compression System - Right Lower Extremity Avoid standing for long periods of time Elevate legs to the level of the heart or above for 30 minutes daily and/or when sitting, a frequency of: - throughout the day Exercise regularly 1. The patient did see Dr. Monica Martinez of vascular surgery. Subsequently based on the review of the note it appears that the patient has no underlying arterial disease based on their evaluation. They did discuss with her that the venous insufficiency is present and recommend that she stay in an Unna boot for the time being until completely healed or least some kind of compression wrap. They also measured her for thigh-high compression and the regular transition to that when she is healed at the 20 to 30 mmHg range. Once that occurs she will have a 3- month follow-up with Dr. Doren Custard or Dr. Oneida Alar to see if she is a candidate for small saphenous vein ablation 2. For the time being I would recommend that we continue with silver alginate dressing along with 3 layer compression wrap is done well for her up to this point. 3. I am also can recommend she continue to elevate her legs as much as possible. We will see patient back for reevaluation in 1 week here in the clinic. If anything worsens or changes patient will contact our office for additional recommendations. Electronic Signature(s) Signed: 10/08/2019 8:28:48 AM By: Worthy Keeler PA-C Entered By: Worthy Keeler on 10/08/2019 65:99:35 -------------------------------------------------------------------------------- SuperBill Details Patient Name: Date of Service: Krystal Little, Krystal Little 10/08/2019 Medical  Record [id number] Patient Account Number: [id number] Date of Birth/Sex: Treating RN: 1956/07/12 (64 y.o. Elam Dutch Primary Care Provider: Stark Klein Other Clinician: Referring Provider: Treating Provider/Extender:Stone III, Lelon Huh, MAKIERA Weeks in Treatment: 5 Diagnosis Coding ICD-10 Codes Code Description [phone] Non-pressure chronic ulcer of right calf with fat layer exposed I87.332 Chronic venous hypertension (idiopathic) with ulcer and inflammation of left lower extremity E11.622 Type 2 diabetes mellitus with other skin ulcer Facility Procedures CPT4 Code Description: [id number] (Facility Use Only) [phone] - APPLY MULTLAY COMPRS LWR RT LEG Modifier: Quantity: 1 Physician Procedures CPT4 Code Description: [id number] [facility] - WC PHYS LEVEL 3 - EST PT ICD-10 Diagnosis Description L97.212 Non-pressure chronic ulcer of right calf with fat layer ex I87.332 Chronic venous hypertension (idiopathic) with ulcer and in extremity E11.622 Type 2  diabetes mellitus with other skin ulcer Modifier: posed flammation of lef Quantity: 1 t lower Electronic Signature(s) Signed: 10/08/2019 8:31:38 AM By: Worthy Keeler PA-C Entered By: Worthy Keeler on 10/08/2019 08:31:38

## 2019-10-14 ENCOUNTER — Encounter (HOSPITAL_BASED_OUTPATIENT_CLINIC_OR_DEPARTMENT_OTHER): Payer: Medicare Other | Admitting: Internal Medicine

## 2019-10-14 ENCOUNTER — Other Ambulatory Visit: Payer: Self-pay

## 2019-10-14 DIAGNOSIS — E11622 Type 2 diabetes mellitus with other skin ulcer: Secondary | ICD-10-CM | POA: Diagnosis not present

## 2019-10-14 DIAGNOSIS — L97812 Non-pressure chronic ulcer of other part of right lower leg with fat layer exposed: Secondary | ICD-10-CM | POA: Diagnosis not present

## 2019-10-14 DIAGNOSIS — I87332 Chronic venous hypertension (idiopathic) with ulcer and inflammation of left lower extremity: Secondary | ICD-10-CM | POA: Diagnosis not present

## 2019-10-20 NOTE — Progress Notes (Signed)
Krystal, Little ([id number]) Visit Report for 10/14/2019 Arrival Information Details Patient Name: Date of Service: Krystal Little, Krystal Little 10/14/2019 8:15 AM Medical Record [id number] Patient Account Number: [id number] Date of Birth/Sex: Treating RN: 23-Jun-1956 (64 y.o. Elam Dutch Primary Care Wahneta Derocher: Stark Klein Other Clinician: Referring Bosten Newstrom: Treating Mercie Balsley/Extender:Robson, Kathee Delton, MAKIERA Weeks in Treatment: 6 Visit Information History Since Last Visit Added or deleted any medications: No Patient Arrived: Ambulatory Any new allergies or adverse reactions: No Arrival Time: 08:28 Had a fall or experienced change in No Accompanied By: self activities of daily living that may affect Transfer Assistance: None risk of falls: Patient Identification Verified: Yes Signs or symptoms of abuse/neglect since last No Secondary Verification Process Yes visito Completed: Hospitalized since last visit: No Patient Requires Transmission- No Implantable device outside of the clinic excluding No Based Precautions: cellular tissue based products placed in the center Patient Has Alerts: Yes since last visit: Patient Alerts: Patient on Blood Has Dressing in Place as Prescribed: Yes Thinner Has Compression in Place as Prescribed: Yes Pain Present Now: No Electronic Signature(s) Signed: 10/15/2019 6:50:57 PM By: Baruch Gouty RN, BSN Entered By: Baruch Gouty on 10/14/2019 08:29:06 -------------------------------------------------------------------------------- Compression Therapy Details Patient Name: Date of Service: Krystal Little 10/14/2019 8:15 AM Medical Record [id number] Patient Account Number: [id number] Date of Birth/Sex: Treating RN: 08/01/1955 (64 y.o. [name] Primary Care Adaya Garmany: Stark Klein Other Clinician: Referring Breckyn Troyer: Treating Meha Vidrine/Extender:Robson, Kathee Delton, MAKIERA Weeks in Treatment:  6 Compression Therapy Performed for Wound Wound #3 Right,Medial Lower Leg Assessment: Performed By: Clinician Carlene Coria, RN Compression Type: Three Layer Post Procedure Diagnosis Same as Pre-procedure Electronic Signature(s) Signed: 10/15/2019 5:40:01 PM By: Carlene Coria RN Entered By: Carlene Coria on 10/14/2019 08:59:52 -------------------------------------------------------------------------------- Compression Therapy Details Patient Name: Date of Service: Krystal Little, Krystal Little 10/14/2019 8:15 AM Medical Record [id number] Patient Account Number: [id number] Date of Birth/Sex: Treating RN: 10-Oct-1955 (64 y.o. [name] Primary Care Ketara Cavness: Stark Klein Other Clinician: Referring Ravindra Baranek: Treating Winson Eichorn/Extender:Robson, Kathee Delton, MAKIERA Weeks in Treatment: 6 Compression Therapy Performed for Wound Wound #4 Right,Distal,Medial Lower Leg Assessment: Performed By: Clinician Carlene Coria, RN Compression Type: Three Layer Post Procedure Diagnosis Same as Pre-procedure Electronic Signature(s) Signed: 10/15/2019 5:40:01 PM By: Carlene Coria RN Entered By: Carlene Coria on 10/14/2019 08:59:52 -------------------------------------------------------------------------------- Encounter Discharge Information Details Patient Name: Date of Service: Krystal Little, Krystal Little 10/14/2019 8:15 AM Medical Record [id number] Patient Account Number: [id number] Date of Birth/Sex: Treating RN: May 06, 1956 (64 y.o. [name] Primary Care Jayke Caul: Stark Klein Other Clinician: Referring Dorrian Doggett: Treating Kearney Evitt/Extender:Robson, Kathee Delton, MAKIERA Weeks in Treatment: 6 Encounter Discharge Information Items Discharge Condition: Stable Ambulatory Status: Ambulatory Discharge Destination: Home Transportation: Private Auto Accompanied By: self Schedule Follow-up Appointment: Yes Clinical Summary of Care: Patient Declined Electronic Signature(s) Signed:  10/20/2019 4:55:48 PM By: Kela Millin Entered By: Kela Millin on 10/14/2019 09:11:07 -------------------------------------------------------------------------------- Lower Extremity Assessment Details Patient Name: Date of Service: Krystal Little, Krystal Little 10/14/2019 8:15 AM Medical Record [id number] Patient Account Number: [id number] Date of Birth/Sex: Treating RN: Apr 12, 1956 (64 y.o. [name] Primary Care Henderson Frampton: Stark Klein Other Clinician: Referring Jerline Linzy: Treating Sydney Hasten/Extender:Robson, Kathee Delton, MAKIERA Weeks in Treatment: 6 Edema Assessment Assessed: [Left: No] [Right: Yes] Edema: [Left: N] [Right: o] Calf Left: Right: Point of Measurement: 26 cm From Medial Instep cm 31 cm Ankle Left: Right: Point of Measurement: 11 cm From Medial Instep cm 21 cm Vascular Assessment Pulses: Dorsalis Pedis Palpable: [Right:Yes] Electronic Signature(s) Signed: 10/15/2019 5:08:19 PM By: Deon Pilling Entered By:  Deon Pilling on 10/14/2019 08:40:31 -------------------------------------------------------------------------------- Multi Wound Chart Details Patient Name: Date of Service: Krystal Little, Krystal Little 10/14/2019 8:15 AM Medical Record [id number] Patient Account Number: [id number] Date of Birth/Sex: Treating RN: 11/27/1955 (64 y.o. Elam Dutch Primary Care Elle Vezina: Stark Klein Other Clinician: Referring Briasia Flinders: Treating Kalev Temme/Extender:Robson, Kathee Delton, MAKIERA Weeks in Treatment: 6 Vital Signs Height(in): 64 Pulse(bpm): 68 Weight(lbs): 140 Blood Pressure(mmHg): 157/84 Body Mass Index(BMI): 24 Temperature(F): 97.7 Respiratory 18 Rate(breaths/min): Photos: [3:No Photos] [4:No Photos] [N/A:N/A] Wound Location: [3:Right Lower Leg - Medial] [4:Right Lower Leg - Medial, Distal] [N/A:N/A] Wounding Event: [3:Bump] [4:Other Lesion] [N/A:N/A] Primary Etiology: [3:Venous Leg Ulcer] [4:To be determined] [N/A:N/A] Comorbid  History: [3:Arrhythmia, Deep Vein Thrombosis, Type II Diabetes] [4:Arrhythmia, Deep VeinN/A Thrombosis, Type II Diabetes] Date Acquired: [3:05/18/2019] [4:10/08/2019] [N/A:N/A] Weeks of Treatment: [3:6] [4:0] [N/A:N/A] Wound Status: [3:Open] [4:Open] [N/A:N/A] Clustered Wound: [3:Yes] [4:No] [N/A:N/A] Measurements L x W x D 0.2x0.2x0.1 [4:0.7x0.8x0.3] [N/A:N/A] (cm) Area (cm) : [3:0.031] [4:0.44] [N/A:N/A] Volume (cm) : [3:0.003] [4:0.132] [N/A:N/A] % Reduction in Area: [3:99.60%] [4:-134.00%] [N/A:N/A] % Reduction in Volume: 99.60% [4:-594.70%] [N/A:N/A] Classification: [3:Full Thickness Without Exposed Support Structures Exposed Support Structures] [4:Full Thickness Without] [N/A:N/A] Exudate Amount: [3:Medium] [4:Medium] [N/A:N/A] Exudate Type: [3:Serosanguineous] [4:Serosanguineous] [N/A:N/A] Exudate Color: [3:red, brown] [4:red, brown] [N/A:N/A] Wound Margin: [3:Distinct, outline attached Distinct, outline attached N/A] Granulation Amount: [3:Large (67-100%)] [4:Large (67-100%)] [N/A:N/A] Granulation Quality: [3:Red] [4:Red] [N/A:N/A] Necrotic Amount: [3:None Present (0%)] [4:None Present (0%)] [N/A:N/A] Exposed Structures: [3:Fascia: No Fat Layer (Subcutaneous Fat Layer (Subcutaneous Tissue) Exposed: No Tendon: No Muscle: No Joint: No Bone: No] [4:Fascia: No Tissue) Exposed: No Tendon: No Muscle: No Joint: No Bone: No] [N/A:N/A] Epithelialization: [3:N/A] [4:Small (1-33%) Compression Therapy] [N/A:N/A N/A] Treatment Notes Wound #3 (Right, Medial Lower Leg) 1. Cleanse With Wound Cleanser Soap and water 2. Periwound Care Moisturizing lotion TCA Cream 3. Primary Dressing Applied Calcium Alginate Ag 4. Secondary Dressing Dry Gauze 6. Support Layer Applied 3 layer compression wrap Wound #4 (Right, Distal, Medial Lower Leg) 1. Cleanse With Wound Cleanser Soap and water 2. Periwound Care Moisturizing lotion TCA Cream 3. Primary Dressing Applied Calcium Alginate Ag 4.  Secondary Dressing Dry Gauze 6. Support Layer Applied 3 layer compression Water quality scientist) Signed: 10/15/2019 6:50:57 PM By: Baruch Gouty RN, BSN Signed: 10/20/2019 7:44:34 AM By: Linton Ham MD Entered By: Linton Ham on 10/14/2019 09:22:17 -------------------------------------------------------------------------------- Multi-Disciplinary Care Plan Details Patient Name: Date of Service: TZIPORA, MCINROY 10/14/2019 8:15 AM Medical Record [id number] Patient Account Number: [id number] Date of Birth/Sex: Treating RN: 1956-02-04 (64 y.o. [name] Primary Care Schyler Butikofer: Stark Klein Other Clinician: Referring Danile Trier: Treating Dario Yono/Extender:Robson, Kathee Delton, MAKIERA Weeks in Treatment: 6 Active Inactive Wound/Skin Impairment Nursing Diagnoses: Knowledge deficit related to ulceration/compromised skin integrity Goals: Patient/caregiver will verbalize understanding of skin care regimen Date Initiated: 09/02/2019 Target Resolution Date: 10/31/2019 Goal Status: Active Ulcer/skin breakdown will have a volume reduction of 30% by week 4 Date Initiated: 09/02/2019 Date Inactivated: 09/30/2019 Target Resolution Date: 09/30/2019 Unmet Reason: comorbities, Goal Status: Unmet pt declines biopsy, debridment Ulcer/skin breakdown will have a volume reduction of 50% by week 8 Date Initiated: 09/30/2019 Target Resolution Date: 10/31/2019 Goal Status: Active Interventions: Assess patient/caregiver ability to obtain necessary supplies Assess patient/caregiver ability to perform ulcer/skin care regimen upon admission and as needed Assess ulceration(s) every visit Notes: Electronic Signature(s) Signed: 10/15/2019 5:40:01 PM By: Carlene Coria RN Entered By: Carlene Coria on 10/14/2019 08:54:24 -------------------------------------------------------------------------------- Pain Assessment Details Patient Name: Date of Service: Krystal Little 10/14/2019  8:15 AM Medical  Record [id number] Patient Account Number: [id number] Date of Birth/Sex: Treating RN: 1956-06-09 (64 y.o. Elam Dutch Primary Care Kazoua Gossen: Stark Klein Other Clinician: Referring Kishon Garriga: Treating Geryl Dohn/Extender:Robson, Kathee Delton, MAKIERA Weeks in Treatment: 6 Active Problems Location of Pain Severity and Description of Pain Patient Has Paino No Site Locations Rate the pain. Current Pain Level: 0 Character of Pain Describe the Pain: Tender Pain Management and Medication Current Pain Management: Electronic Signature(s) Signed: 10/15/2019 6:50:57 PM By: Baruch Gouty RN, BSN Entered By: Baruch Gouty on 10/14/2019 66:06:30 -------------------------------------------------------------------------------- Patient/Caregiver Education Details Patient Name: Date of Service: Krystal Little 3/30/2021andnbsp8:15 AM Medical Record [id number] Patient Account Number: [id number] Date of Birth/Gender: Treating RN: 06-May-1956 (64 y.o. [name] Primary Care Physician: Stark Klein Other Clinician: Referring Physician: Treating Physician/Extender:Robson, Kathee Delton, Danise Mina in Treatment: 6 Education Assessment Education Provided To: Patient Education Topics Provided Wound/Skin Impairment: Methods: Explain/Verbal Responses: State content correctly Electronic Signature(s) Signed: 10/15/2019 5:40:01 PM By: Carlene Coria RN Entered By: Carlene Coria on 10/14/2019 08:54:40 -------------------------------------------------------------------------------- Wound Assessment Details Patient Name: Date of Service: Krystal Little, Krystal Little 10/14/2019 8:15 AM Medical Record [id number] Patient Account Number: [id number] Date of Birth/Sex: Treating RN: 10-21-1955 (64 y.o. [name], Meta.Reding Primary Care Iriana Artley: Stark Klein Other Clinician: Referring Ajahnae Rathgeber: Treating Shawnn Bouillon/Extender:Robson, Kathee Delton,  MAKIERA Weeks in Treatment: 6 Wound Status Wound Number: 3 Primary Venous Leg Ulcer Etiology: Wound Location: Right Lower Leg - Medial Wound Open Wounding Event: Bump Status: Date Acquired: 05/18/2019 Comorbid Arrhythmia, Deep Vein Thrombosis, Type Weeks Of Treatment: 6 History: II Diabetes Clustered Wound: Yes Photos Photo Uploaded By: Mikeal Hawthorne on 10/17/2019 10:12:59 Wound Measurements Length: (cm) 0.2 % Reduct Width: (cm) 0.2 % Reduct Depth: (cm) 0.1 Tunnelin Area: (cm) 0.031 Undermi Volume: (cm) 0.003 Wound Description Classification: Full Thickness Without Exposed Support Foul Odo Structures Slough/F Wound Distinct, outline attached Margin: Exudate Medium Amount: Exudate Serosanguineous Type: Exudate red, brown Color: Wound Bed Granulation Amount: Large (67-100%) Granulation Quality: Red Fascia E Necrotic Amount: None Present (0%) Fat Laye Tendon E Muscle E Joint Ex Bone Exp r After Cleansing: No ibrino No Exposed Structure xposed: No r (Subcutaneous Tissue) Exposed: No xposed: No xposed: No posed: No osed: No ion in Area: 99.6% ion in Volume: 99.6% g: No ning: No Treatment Notes Wound #3 (Right, Medial Lower Leg) 1. Cleanse With Wound Cleanser Soap and water 2. Periwound Care Moisturizing lotion TCA Cream 3. Primary Dressing Applied Calcium Alginate Ag 4. Secondary Dressing Dry Gauze 6. Support Layer Applied 3 layer compression wrap Electronic Signature(s) Signed: 10/15/2019 5:08:19 PM By: Deon Pilling Entered By: Deon Pilling on 10/14/2019 08:39:24 -------------------------------------------------------------------------------- Wound Assessment Details Patient Name: Date of Service: Krystal Little, Krystal Little 10/14/2019 8:15 AM Medical Record [id number] Patient Account Number: [id number] Date of Birth/Sex: Treating RN: 10/11/1955 (64 y.o. [name] Primary Care Omara Alcon: Stark Klein Other Clinician: Referring  Manases Etchison: Treating Kane Kusek/Extender:Robson, Kathee Delton, MAKIERA Weeks in Treatment: 6 Wound Status Wound Number: 4 Primary To be determined Etiology: Wound Location: Right Lower Leg - Medial, Distal Wound Open Wounding Event: Other Lesion Status: Date Acquired: 10/08/2019 Comorbid Arrhythmia, Deep Vein Thrombosis, Type Weeks Of Treatment: 0 History: II Diabetes Clustered Wound: No Photos Photo Uploaded By: Mikeal Hawthorne on 10/17/2019 10:13:00 Wound Measurements Length: (cm) 0.7 % Reduct Width: (cm) 0.8 % Reduct Depth: (cm) 0.3 Epitheli Area: (cm) 0.44 Tunneli Volume: (cm) 0.132 Undermi Wound Description Classification: Full Thickness Without Exposed Support Foul Od Structures Slough/ Wound Distinct, outline attached Margin: Exudate Medium Amount: Exudate Serosanguineous Type: Exudate  red, brown Color: Wound Bed Granulation Amount: Large (67-100%) Granulation Quality: Red Fascia Necrotic Amount: None Present (0%) Fat Lay Tendon Muscle Exp Joint Expo Bone Expos or After Cleansing: No Fibrino No Exposed Structure Exposed: No er (Subcutaneous Tissue) Exposed: No Exposed: No osed: No sed: No ed: No ion in Area: -134% ion in Volume: -594.7% alization: Small (1-33%) ng: No ning: No Treatment Notes Wound #4 (Right, Distal, Medial Lower Leg) 1. Cleanse With Wound Cleanser Soap and water 2. Periwound Care Moisturizing lotion TCA Cream 3. Primary Dressing Applied Calcium Alginate Ag 4. Secondary Dressing Dry Gauze 6. Support Layer Applied 3 layer compression wrap Electronic Signature(s) Signed: 10/15/2019 5:08:19 PM By: Deon Pilling Entered By: Deon Pilling on 10/14/2019 08:40:10 -------------------------------------------------------------------------------- Vitals Details Patient Name: Date of Service: Krystal Little 10/14/2019 8:15 AM Medical Record [id number] Patient Account Number: [id number] Date of Birth/Sex: Treating  RN: [birth year] (64 y.o. Elam Dutch Primary Care Aiva Miskell: Stark Klein Other Clinician: Referring Lakynn Halvorsen: Treating Adrean Findlay/Extender:Robson, Kathee Delton, MAKIERA Weeks in Treatment: 6 Vital Signs Time Taken: 08:29 Temperature (F): 97.7 Height (in): 64 Pulse (bpm): 68 Source: Stated Respiratory Rate (breaths/min): 18 Weight (lbs): 140 Blood Pressure (mmHg): 157/84 Source: Stated Reference Range: 80 - 120 mg / dl Body Mass Index (BMI): 24 Electronic Signature(s) Signed: 10/15/2019 6:50:57 PM By: Baruch Gouty RN, BSN Entered By: Baruch Gouty on 10/14/2019 08:31:07

## 2019-10-20 NOTE — Progress Notes (Signed)
RICHELL, CORKER ([id number]) Visit Report for 10/14/2019 HPI Details Patient Name: Date of Service: Krystal Little, Krystal Little 10/14/2019 8:15 AM Medical Record [id number] Patient Account Number: [id number] Date of Birth/Sex: Treating RN: August 05, 1955 (64 y.o. Elam Dutch Primary Care Provider: Stark Klein Other Clinician: Referring Provider: Treating Provider/Extender:Donevin Sainsbury, Kathee Delton, MAKIERA Weeks in Treatment: 6 History of Present Illness HPI Description: ADMISSION 09/02/2019 This is a 64 year old woman who is a poorly controlled type II diabetic with a recent hemoglobin A1c of 12.2. She tells [place] that she developed a wound on her right medial calf sometime in November. She is not exactly sure how this happened. She wonders if she got bit by something. She went to the ER at the end of December and they felt this was an abscess. They gave her doxycycline they did not culture this and she used topical Neosporin. At that point it was apparently draining. On 08/18/2019 she saw her primary physician at family practice. Correctly noted that this was probably venous insufficiency but listed in the differential for an inflammatory ulcer of uncertain etiology and wondered about a biopsy. They gave her an additional 10 days of doxycycline and she has been using triple antibiotics. She says the wound actually has been getting larger. Past medical history includes type 2 diabetes uncontrolled, chronic atrial fibrillation on Coumadin, status post mitral valve replacement, pacemaker, hyperlipidemia and hypertension. ABIs in our clinic were 0.80 on the right however the patient's pulses in her foot at both the dorsalis pedis and posterior tibial are vibrant 2/23; patient wound actually looks somewhat better. Still several open areas. Debris on the surface. We have been using Iodoflex under compression. 3/2; patient has an odd wound on the medial part of her mid calf. Some surrounding  inflammation which could be venous inflammation. The wound is larger and deeper today. The patient had a reflux studies. She has greater saphenous vein reflux at the saphenofemoral junction as well as small saphenous vein reflux in the proximal calf and mid calf. There is no evidence of a DVT from the common femoral to the popliteal veins. I have referred her to vascular surgery for review of the reflux vis--vis the wound area on the left medial calf As the wound did not progress on a trajectory I went back and discussed biopsying this area with the patient which was one of the reasons her primary doctor sent her here. I told her the issue here would did make sure this is not an atypical ulcer either malignant or other types of inflammatory ulcers. In any case she refused. I told her that we would bring this up again next week. 3/9; continued odd looking wound on the medial part of her right calf. Some surrounding inflammation which could be venous inflammation. The wound itself has open areas but also nonadherent skin with connecting small holes. I told her I wanted to biopsy this last week and she refused. Today I told her I wanted to remove some of the overhanging skin connecting the small holes to allow better dressing of the actual wound area. She also refused this. Finally she has significant reflux involving the small saphenous vein in the proximal calf and mid calf. I would like vascular surgery to review this for possible ablation but I do not have an appointment yet 3/16; odd looking wound on the medial part of her right calf surrounding inflammation which could be venous inflammation. We have been using silver alginate under compression but really making no headway  here. She has some nonadherent surface epithelium with small holes in it. I do not think this is really viable but she has not let me either debride this or biopsy it. She brings up today that she was actually in this clinic  in 2015 with wounds on the left leg although I have no information on this. She was apparently treated with a skin substitute with healing. Even if that were true I would need to debride this. She sees vein and vascular on 3/23 10/08/2019 upon evaluation today patient appears to be doing very well with regard to her ulcer on the lower extremity. She has been tolerating the dressing changes without complication. In fact this seems to be making excellent progress at this time. No fevers, chills, nausea, vomiting, or diarrhea. 3/30; wound is actually much better than 2 weeks ago. I found myself pleasantly surprised about this. Still surrounding venous inflammation. I am not sure where we are with the possibility of ablation. She apparently saw a vein and vascular on 3/23 Electronic Signature(s) Signed: 10/20/2019 7:44:34 AM By: Linton Ham MD Entered By: Linton Ham on 10/14/2019 09:25:16 -------------------------------------------------------------------------------- Physical Exam Details Patient Name: Date of Service: Krystal Little, Krystal Little 10/14/2019 8:15 AM Medical Record [id number] Patient Account Number: [id number] Date of Birth/Sex: Treating RN: [birth year] (64 y.o. Elam Dutch Primary Care Provider: Stark Klein Other Clinician: Referring Provider: Treating Provider/Extender:Jency Schnieders, Kathee Delton, MAKIERA Weeks in Treatment: 6 Constitutional Patient is hypertensive.. Pulse regular and within target range for patient.Marland Kitchen Respirations regular, non-labored and within target range.. Temperature is normal and within the target range for the patient.Marland Kitchen Appears in no distress. Cardiovascular Pedal pulses are palpable. No edema.. Integumentary (Hair, Skin) Has what appears to be inflammation around the wound but this looks better than when I last saw this. There is no surrounding tenderness. Notes Wound exam; the patient's wound is epithelialized except for 1 small open  area. She had a tag of epithelialization. This area is in the bottom of where her original wound is. Electronic Signature(s) Signed: 10/20/2019 7:44:34 AM By: Linton Ham MD Entered By: Linton Ham on 10/14/2019 09:27:04 -------------------------------------------------------------------------------- Physician Orders Details Patient Name: Date of Service: CITLALIC, NORLANDER 10/14/2019 8:15 AM Medical Record [id number] Patient Account Number: [id number] Date of Birth/Sex: Treating RN: [birth year] (64 y.o. [name] Primary Care Provider: Stark Klein Other Clinician: Referring Provider: Treating Provider/Extender:Zameria Vogl, Kathee Delton, MAKIERA Weeks in Treatment: 6 Verbal / Phone Orders: No Diagnosis Coding ICD-10 Coding Code Description [phone] Non-pressure chronic ulcer of right calf with fat layer exposed I87.332 Chronic venous hypertension (idiopathic) with ulcer and inflammation of left lower extremity E11.622 Type 2 diabetes mellitus with other skin ulcer Follow-up Appointments Return Appointment in 1 week. - Tuesday Dressing Change Frequency Do not change entire dressing for one week. Skin Barriers/Peri-Wound Care Moisturizing lotion TCA Cream or Ointment - mixed with lotion Wound Cleansing May shower with protection. Primary Wound Dressing Wound #3 Right,Medial Lower Leg Calcium Alginate with Silver Wound #4 Right,Distal,Medial Lower Leg Calcium Alginate with Silver Secondary Dressing Wound #3 Right,Medial Lower Leg Dry Gauze Edema Control 3 Layer Compression System - Right Lower Extremity Avoid standing for long periods of time Elevate legs to the level of the heart or above for 30 minutes daily and/or when sitting, a frequency of: - throughout the day Exercise regularly Electronic Signature(s) Signed: 10/15/2019 5:40:01 PM By: Carlene Coria RN Signed: 10/20/2019 7:44:34 AM By: Linton Ham MD Entered By: Carlene Coria on 10/14/2019  08:54:13 -------------------------------------------------------------------------------- Problem List Details  Patient Name: Date of Service: Krystal Little, Krystal Little 10/14/2019 8:15 AM Medical Record [id number] Patient Account Number: [id number] Date of Birth/Sex: Treating RN: June 22, 1956 (64 y.o. [name] Primary Care Provider: Stark Klein Other Clinician: Referring Provider: Treating Provider/Extender:Sulay Brymer, Kathee Delton, MAKIERA Weeks in Treatment: 6 Active Problems ICD-10 Evaluated Encounter Code Description Active Date Today Diagnosis L97.212 Non-pressure chronic ulcer of right calf with fat layer 09/02/2019 No Yes exposed I87.332 Chronic venous hypertension (idiopathic) with ulcer 09/02/2019 No Yes and inflammation of left lower extremity E11.622 Type 2 diabetes mellitus with other skin ulcer 09/02/2019 No Yes Inactive Problems Resolved Problems Electronic Signature(s) Signed: 10/20/2019 7:44:34 AM By: Linton Ham MD Entered By: Linton Ham on 10/14/2019 09:22:10 -------------------------------------------------------------------------------- Progress Note Details Patient Name: Date of Service: Krystal Little 10/14/2019 8:15 AM Medical Record [id number] Patient Account Number: [id number] Date of Birth/Sex: Treating RN: 04-Aug-1955 (64 y.o. Elam Dutch Primary Care Provider: Stark Klein Other Clinician: Referring Provider: Treating Provider/Extender:Hyrum Shaneyfelt, Kathee Delton, MAKIERA Weeks in Treatment: 6 Subjective History of Present Illness (HPI) ADMISSION 09/02/2019 This is a 65 year old woman who is a poorly controlled type II diabetic with a recent hemoglobin A1c of 12.2. She tells [place] that she developed a wound on her right medial calf sometime in November. She is not exactly sure how this happened. She wonders if she got bit by something. She went to the ER at the end of December and they felt this was an abscess. They gave  her doxycycline they did not culture this and she used topical Neosporin. At that point it was apparently draining. On 08/18/2019 she saw her primary physician at family practice. Correctly noted that this was probably venous insufficiency but listed in the differential for an inflammatory ulcer of uncertain etiology and wondered about a biopsy. They gave her an additional 10 days of doxycycline and she has been using triple antibiotics. She says the wound actually has been getting larger. Past medical history includes type 2 diabetes uncontrolled, chronic atrial fibrillation on Coumadin, status post mitral valve replacement, pacemaker, hyperlipidemia and hypertension. ABIs in our clinic were 0.80 on the right however the patient's pulses in her foot at both the dorsalis pedis and posterior tibial are vibrant 2/23; patient wound actually looks somewhat better. Still several open areas. Debris on the surface. We have been using Iodoflex under compression. 3/2; patient has an odd wound on the medial part of her mid calf. Some surrounding inflammation which could be venous inflammation. The wound is larger and deeper today. The patient had a reflux studies. She has greater saphenous vein reflux at the saphenofemoral junction as well as small saphenous vein reflux in the proximal calf and mid calf. There is no evidence of a DVT from the common femoral to the popliteal veins. I have referred her to vascular surgery for review of the reflux vis--vis the wound area on the left medial calf As the wound did not progress on a trajectory I went back and discussed biopsying this area with the patient which was one of the reasons her primary doctor sent her here. I told her the issue here would did make sure this is not an atypical ulcer either malignant or other types of inflammatory ulcers. In any case she refused. I told her that we would bring this up again next week. 3/9; continued odd looking wound on  the medial part of her right calf. Some surrounding inflammation which could be venous inflammation. The wound itself has open areas but also nonadherent  skin with connecting small holes. I told her I wanted to biopsy this last week and she refused. Today I told her I wanted to remove some of the overhanging skin connecting the small holes to allow better dressing of the actual wound area. She also refused this. Finally she has significant reflux involving the small saphenous vein in the proximal calf and mid calf. I would like vascular surgery to review this for possible ablation but I do not have an appointment yet 3/16; odd looking wound on the medial part of her right calf surrounding inflammation which could be venous inflammation. We have been using silver alginate under compression but really making no headway here. She has some nonadherent surface epithelium with small holes in it. I do not think this is really viable but she has not let me either debride this or biopsy it. She brings up today that she was actually in this clinic in 2015 with wounds on the left leg although I have no information on this. She was apparently treated with a skin substitute with healing. Even if that were true I would need to debride this. She sees vein and vascular on 3/23 10/08/2019 upon evaluation today patient appears to be doing very well with regard to her ulcer on the lower extremity. She has been tolerating the dressing changes without complication. In fact this seems to be making excellent progress at this time. No fevers, chills, nausea, vomiting, or diarrhea. 3/30; wound is actually much better than 2 weeks ago. I found myself pleasantly surprised about this. Still surrounding venous inflammation. I am not sure where we are with the possibility of ablation. She apparently saw a vein and vascular on 3/23 Objective Constitutional Patient is hypertensive.. Pulse regular and within target range for  patient.Marland Kitchen Respirations regular, non-labored and within target range.. Temperature is normal and within the target range for the patient.Marland Kitchen Appears in no distress. Vitals Time Taken: 8:29 AM, Height: 64 in, Source: Stated, Weight: 140 lbs, Source: Stated, BMI: 24, Temperature: 97.7 F, Pulse: 68 bpm, Respiratory Rate: 18 breaths/min, Blood Pressure: 157/84 mmHg. Cardiovascular Pedal pulses are palpable. No edema.. Little Notes: Wound exam; the patient's wound is epithelialized except for 1 small open area. She had a tag of epithelialization. This area is in the bottom of where her original wound is. Integumentary (Hair, Skin) Has what appears to be inflammation around the wound but this looks better than when I last saw this. There is no surrounding tenderness. Wound #3 status is Open. Original cause of wound was Bump. The wound is located on the Right,Medial Lower Leg. The wound measures 0.2cm length x 0.2cm width x 0.1cm depth; 0.031cm^2 area and 0.003cm^3 volume. There is no tunneling or undermining noted. There is a medium amount of serosanguineous drainage noted. The wound margin is distinct with the outline attached to the wound base. There is large (67-100%) red granulation within the wound bed. There is no necrotic tissue within the wound bed. Wound #4 status is Open. Original cause of wound was Other Lesion. The wound is located on the Right,Distal,Medial Lower Leg. The wound measures 0.7cm length x 0.8cm width x 0.3cm depth; 0.44cm^2 area and 0.132cm^3 volume. There is no tunneling or undermining noted. There is a medium amount of serosanguineous drainage noted. The wound margin is distinct with the outline attached to the wound base. There is large (67-100%) red granulation within the wound bed. There is no necrotic tissue within the wound bed. Assessment Active Problems ICD-10 Non-pressure  chronic ulcer of right calf with fat layer exposed Chronic venous hypertension (idiopathic)  with ulcer and inflammation of left lower extremity Type 2 diabetes mellitus with other skin ulcer Procedures Wound #3 Pre-procedure diagnosis of Wound #3 is a Venous Leg Ulcer located on the Right,Medial Lower Leg . There was a Three Layer Compression Therapy Procedure by Carlene Coria, RN. Post procedure Diagnosis Wound #3: Same as Pre-Procedure Wound #4 Pre-procedure diagnosis of Wound #4 is a To be determined located on the Right,Distal,Medial Lower Leg . There was a Three Layer Compression Therapy Procedure by Carlene Coria, RN. Post procedure Diagnosis Wound #4: Same as Pre-Procedure Plan Follow-up Appointments: Return Appointment in 1 week. - Tuesday Dressing Change Frequency: Do not change entire dressing for one week. Skin Barriers/Peri-Wound Care: Moisturizing lotion TCA Cream or Ointment - mixed with lotion Wound Cleansing: May shower with protection. Primary Wound Dressing: Wound #3 Right,Medial Lower Leg: Calcium Alginate with Silver Wound #4 Right,Distal,Medial Lower Leg: Calcium Alginate with Silver Secondary Dressing: Wound #3 Right,Medial Lower Leg: Dry Gauze Edema Control: 3 Layer Compression System - Right Lower Extremity Avoid standing for long periods of time Elevate legs to the level of the heart or above for 30 minutes daily and/or when sitting, a frequency of: - throughout the day Exercise regularly 1. Continue with TCA 2. Continue with silver alginate under 3 layer compression 3. Surprisingly this is just about closed. At 1 point a geographic wound with tightly adherent necrotic debris. This seems to have cleaned up a lot since I saw her 2 weeks ago. I had actually thought about biopsying this at 1 point Electronic Signature(s) Signed: 10/20/2019 7:44:34 AM By: Linton Ham MD Entered By: Linton Ham on 10/14/2019 09:28:14 -------------------------------------------------------------------------------- SuperBill Details Patient Name: Date of  Service: Krystal Little 10/14/2019 Medical Record [id number] Patient Account Number: [id number] Date of Birth/Sex: Treating RN: 1955-11-05 (64 y.o. Elam Dutch Primary Care Provider: Stark Klein Other Clinician: Referring Provider: Treating Provider/Extender:Jasmaine Rochel, Kathee Delton, MAKIERA Weeks in Treatment: 6 Diagnosis Coding ICD-10 Codes Code Description [id number] Non-pressure chronic ulcer of right calf with fat layer exposed I87.332 Chronic venous hypertension (idiopathic) with ulcer and inflammation of left lower extremity E11.622 Type 2 diabetes mellitus with other skin ulcer Physician Procedures CPT4 Code Description: [id number] - WC PHYS LEVEL 3 - EST PT ICD-10 Diagnosis Description L97.212 Non-pressure chronic ulcer of right calf with fat layer ex I87.332 Chronic venous hypertension (idiopathic) with ulcer and in extremity E11.622 Type 2  diabetes mellitus with other skin ulcer Modifier: posed flammation of lef Quantity: 1 t lower Electronic Signature(s) Signed: 10/20/2019 7:44:34 AM By: Linton Ham MD Entered By: Linton Ham on 10/14/2019 63:33:54

## 2019-10-21 ENCOUNTER — Encounter (HOSPITAL_BASED_OUTPATIENT_CLINIC_OR_DEPARTMENT_OTHER): Payer: Medicare Other | Attending: Internal Medicine | Admitting: Internal Medicine

## 2019-10-21 ENCOUNTER — Other Ambulatory Visit: Payer: Self-pay

## 2019-10-21 DIAGNOSIS — I482 Chronic atrial fibrillation, unspecified: Secondary | ICD-10-CM | POA: Diagnosis not present

## 2019-10-21 DIAGNOSIS — E1151 Type 2 diabetes mellitus with diabetic peripheral angiopathy without gangrene: Secondary | ICD-10-CM | POA: Diagnosis not present

## 2019-10-21 DIAGNOSIS — Z952 Presence of prosthetic heart valve: Secondary | ICD-10-CM | POA: Diagnosis not present

## 2019-10-21 DIAGNOSIS — L97212 Non-pressure chronic ulcer of right calf with fat layer exposed: Secondary | ICD-10-CM | POA: Insufficient documentation

## 2019-10-21 DIAGNOSIS — E11622 Type 2 diabetes mellitus with other skin ulcer: Secondary | ICD-10-CM | POA: Diagnosis not present

## 2019-10-21 DIAGNOSIS — Z86718 Personal history of other venous thrombosis and embolism: Secondary | ICD-10-CM | POA: Insufficient documentation

## 2019-10-21 DIAGNOSIS — I87311 Chronic venous hypertension (idiopathic) with ulcer of right lower extremity: Secondary | ICD-10-CM | POA: Diagnosis not present

## 2019-10-21 DIAGNOSIS — E785 Hyperlipidemia, unspecified: Secondary | ICD-10-CM | POA: Insufficient documentation

## 2019-10-21 DIAGNOSIS — Z7901 Long term (current) use of anticoagulants: Secondary | ICD-10-CM | POA: Diagnosis not present

## 2019-10-21 DIAGNOSIS — L97812 Non-pressure chronic ulcer of other part of right lower leg with fat layer exposed: Secondary | ICD-10-CM | POA: Diagnosis not present

## 2019-10-21 DIAGNOSIS — I872 Venous insufficiency (chronic) (peripheral): Secondary | ICD-10-CM | POA: Diagnosis not present

## 2019-10-22 NOTE — Progress Notes (Signed)
[name], [name] ([id number]) Visit Report for 10/21/2019 Debridement Details Patient Name: Date of Service: Krystal Little, Krystal Little 10/21/2019 8:15 AM Medical Record [id number] Patient Account Number: [id number] Date of Birth/Sex: Treating RN: 1956/07/09 (64 y.o. [name] Primary Care Provider: Stark Klein Other Clinician: Referring Provider: Treating Provider/Extender:Olina Melfi, Kathee Delton, MAKIERA Weeks in Treatment: 7 Debridement Performed for Wound #4 Right,Distal,Medial Lower Leg Assessment: Performed By: Physician Ricard Dillon., MD Debridement Type: Debridement Severity of Tissue Pre Fat layer exposed Debridement: Level of Consciousness (Pre- Awake and Alert procedure): Pre-procedure Verification/Time Out Taken: Yes - 09:36 Start Time: 09:36 Pain Control: Lidocaine 5% topical ointment Total Area Debrided (L x W): 1.2 (cm) x 1.3 (cm) = 1.56 (cm) Tissue and other material Viable, Non-Viable, Skin: Dermis , Skin: Epidermis debrided: Level: Skin/Epidermis Debridement Description: Selective/Open Wound Instrument: Forceps, Scissors Bleeding: Minimum Hemostasis Achieved: Pressure End Time: 09:37 Procedural Pain: 0 Post Procedural Pain: 0 Response to Treatment: Procedure was tolerated well Level of Consciousness Awake and Alert (Post-procedure): Post Debridement Measurements of Total Wound Length: (cm) 1.2 Width: (cm) 1.3 Depth: (cm) 0.1 Volume: (cm) 0.123 Character of Wound/Ulcer Post Improved Debridement: Severity of Tissue Post Debridement: Fat layer exposed Post Procedure Diagnosis Same as Pre-procedure Electronic Signature(s) Signed: 10/21/2019 6:13:52 PM By: Linton Ham MD Signed: 10/21/2019 6:43:44 PM By: Carlene Coria RN Entered By: Linton Ham on 10/21/2019 10:00:51 -------------------------------------------------------------------------------- HPI Details Patient Name: Date of Service: Krystal Little 10/21/2019 8:15  AM Medical Record [id number] Patient Account Number: [id number] Date of Birth/Sex: Treating RN: 09-06-1955 (64 y.o. [name] Primary Care Provider: Stark Klein Other Clinician: Referring Provider: Treating Provider/Extender:Franki Stemen, Kathee Delton, MAKIERA Weeks in Treatment: 7 History of Present Illness HPI Description: ADMISSION 09/02/2019 This is a 64 year old woman who is a poorly controlled type II diabetic with a recent hemoglobin A1c of 12.2. She tells [place] that she developed a wound on her right medial calf sometime in November. She is not exactly sure how this happened. She wonders if she got bit by something. She went to the ER at the end of December and they felt this was an abscess. They gave her doxycycline they did not culture this and she used topical Neosporin. At that point it was apparently draining. On 08/18/2019 she saw her primary physician at family practice. Correctly noted that this was probably venous insufficiency but listed in the differential for an inflammatory ulcer of uncertain etiology and wondered about a biopsy. They gave her an additional 10 days of doxycycline and she has been using triple antibiotics. She says the wound actually has been getting larger. Past medical history includes type 2 diabetes uncontrolled, chronic atrial fibrillation on Coumadin, status post mitral valve replacement, pacemaker, hyperlipidemia and hypertension. ABIs in our clinic were 0.80 on the right however the patient's pulses in her foot at both the dorsalis pedis and posterior tibial are vibrant 2/23; patient wound actually looks somewhat better. Still several open areas. Debris on the surface. We have been using Iodoflex under compression. 3/2; patient has an odd wound on the medial part of her mid calf. Some surrounding inflammation which could be venous inflammation. The wound is larger and deeper today. The patient had a reflux studies. She has greater  saphenous vein reflux at the saphenofemoral junction as well as small saphenous vein reflux in the proximal calf and mid calf. There is no evidence of a DVT from the common femoral to the popliteal veins. I have referred her to vascular surgery for review of the  reflux vis--vis the wound area on the left medial calf As the wound did not progress on a trajectory I went back and discussed biopsying this area with the patient which was one of the reasons her primary doctor sent her here. I told her the issue here would did make sure this is not an atypical ulcer either malignant or other types of inflammatory ulcers. In any case she refused. I told her that we would bring this up again next week. 3/9; continued odd looking wound on the medial part of her right calf. Some surrounding inflammation which could be venous inflammation. The wound itself has open areas but also nonadherent skin with connecting small holes. I told her I wanted to biopsy this last week and she refused. Today I told her I wanted to remove some of the overhanging skin connecting the small holes to allow better dressing of the actual wound area. She also refused this. Finally she has significant reflux involving the small saphenous vein in the proximal calf and mid calf. I would like vascular surgery to review this for possible ablation but I do not have an appointment yet 3/16; odd looking wound on the medial part of her right calf surrounding inflammation which could be venous inflammation. We have been using silver alginate under compression but really making no headway here. She has some nonadherent surface epithelium with small holes in it. I do not think this is really viable but she has not let me either debride this or biopsy it. She brings up today that she was actually in this clinic in 2015 with wounds on the left leg although I have no information on this. She was apparently treated with a skin substitute with  healing. Even if that were true I would need to debride this. She sees vein and vascular on 3/23 10/08/2019 upon evaluation today patient appears to be doing very well with regard to her ulcer on the lower extremity. She has been tolerating the dressing changes without complication. In fact this seems to be making excellent progress at this time. No fevers, chills, nausea, vomiting, or diarrhea. 3/30; wound is actually much better than 2 weeks ago. I found myself pleasantly surprised about this. Still surrounding venous inflammation. I am not sure where we are with the possibility of ablation. She apparently saw a vein and vascular on 3/23 4/6; the wounds I think is still somewhat improved although she still has a a fairly large wound distally in the original area again with some small open holes on the periphery of it. She has a second smaller hole medially and superiorly. We have been using silver alginate under compression Electronic Signature(s) Signed: 10/21/2019 6:13:52 PM By: Linton Ham MD Entered By: Linton Ham on 10/21/2019 10:02:00 -------------------------------------------------------------------------------- Physical Exam Details Patient Name: Date of Service: ARYAA, BUNTING 10/21/2019 8:15 AM Medical Record [id number] Patient Account Number: [id number] Date of Birth/Sex: Treating RN: [birth year] (64 y.o. [name] Primary Care Provider: Stark Klein Other Clinician: Referring Provider: Treating Provider/Extender:Berdella Bacot, Kathee Delton, MAKIERA Weeks in Treatment: 7 Constitutional Patient is hypertensive.. Pulse regular and within target range for patient.Marland Kitchen Respirations regular, non-labored and within target range.. Temperature is normal and within the target range for the patient.Marland Kitchen Appears in no distress. Notes Wound exam; she has a area in the inferior part of this wound that is open. Several small open holes to the periphery and one superiorly  and laterally. The exact cause of this is never been clear. She had  skin that required removal with pickups and scissors hemostasis with direct pressure Electronic Signature(s) Signed: 10/21/2019 6:13:52 PM By: Linton Ham MD Entered By: Linton Ham on 10/21/2019 10:02:55 -------------------------------------------------------------------------------- Physician Orders Details Patient Name: Date of Service: LAJEAN, BOESE 10/21/2019 8:15 AM Medical Record [id number] Patient Account Number: [id number] Date of Birth/Sex: Treating RN: 09/01/1955 (64 y.o. [name] Primary Care Provider: Stark Klein Other Clinician: Referring Provider: Treating Provider/Extender:Janea Schwenn, Kathee Delton, MAKIERA Weeks in Treatment: 7 Verbal / Phone Orders: No Diagnosis Coding ICD-10 Coding Code Description [phone] Non-pressure chronic ulcer of right calf with fat layer exposed I87.332 Chronic venous hypertension (idiopathic) with ulcer and inflammation of left lower extremity E11.622 Type 2 diabetes mellitus with other skin ulcer Follow-up Appointments Return Appointment in 1 week. - Tuesday Dressing Change Frequency Do not change entire dressing for one week. Skin Barriers/Peri-Wound Care Moisturizing lotion TCA Cream or Ointment - mixed with lotion Wound Cleansing May shower with protection. Primary Wound Dressing Wound #3 Right,Medial Lower Leg Calcium Alginate with Silver Wound #4 Right,Distal,Medial Lower Leg Calcium Alginate with Silver Secondary Dressing Wound #3 Right,Medial Lower Leg Dry Gauze Edema Control 3 Layer Compression System - Right Lower Extremity Avoid standing for long periods of time Elevate legs to the level of the heart or above for 30 minutes daily and/or when sitting, a frequency of: - throughout the day Exercise regularly Electronic Signature(s) Signed: 10/21/2019 6:13:52 PM By: Linton Ham MD Signed: 10/21/2019 6:43:44 PM By: Carlene Coria  RN Entered By: Carlene Coria on 10/21/2019 08:39:29 -------------------------------------------------------------------------------- Problem List Details Patient Name: Date of Service: Krystal Little 10/21/2019 8:15 AM Medical Record [id number] Patient Account Number: [id number] Date of Birth/Sex: Treating RN: 23-Feb-1956 (64 y.o. [name] Primary Care Provider: Other Clinician: Stark Klein Referring Provider: Treating Provider/Extender:Nyeshia Mysliwiec, Kathee Delton, MAKIERA Weeks in Treatment: 7 Active Problems ICD-10 Evaluated Encounter Code Description Active Date Today Diagnosis L97.212 Non-pressure chronic ulcer of right calf with fat layer 09/02/2019 No Yes exposed I87.332 Chronic venous hypertension (idiopathic) with ulcer 09/02/2019 No Yes and inflammation of left lower extremity E11.622 Type 2 diabetes mellitus with other skin ulcer 09/02/2019 No Yes Inactive Problems Resolved Problems Electronic Signature(s) Signed: 10/21/2019 6:13:52 PM By: Linton Ham MD Entered By: Linton Ham on 10/21/2019 10:00:15 -------------------------------------------------------------------------------- Progress Note Details Patient Name: Date of Service: Krystal Little 10/21/2019 8:15 AM Medical Record [id number] Patient Account Number: [id number] Date of Birth/Sex: Treating RN: 1956-02-16 (64 y.o. [name] Primary Care Provider: Stark Klein Other Clinician: Referring Provider: Treating Provider/Extender:Darl Brisbin, Kathee Delton, MAKIERA Weeks in Treatment: 7 Subjective History of Present Illness (HPI) ADMISSION 09/02/2019 This is a 64 year old woman who is a poorly controlled type II diabetic with a recent hemoglobin A1c of 12.2. She tells [place] that she developed a wound on her right medial calf sometime in November. She is not exactly sure how this happened. She wonders if she got bit by something. She went to the ER at the end of December and they  felt this was an abscess. They gave her doxycycline they did not culture this and she used topical Neosporin. At that point it was apparently draining. On 08/18/2019 she saw her primary physician at family practice. Correctly noted that this was probably venous insufficiency but listed in the differential for an inflammatory ulcer of uncertain etiology and wondered about a biopsy. They gave her an additional 10 days of doxycycline and she has been using triple antibiotics. She says the wound actually has been getting larger. Past medical history  includes type 2 diabetes uncontrolled, chronic atrial fibrillation on Coumadin, status post mitral valve replacement, pacemaker, hyperlipidemia and hypertension. ABIs in our clinic were 0.80 on the right however the patient's pulses in her foot at both the dorsalis pedis and posterior tibial are vibrant 2/23; patient wound actually looks somewhat better. Still several open areas. Debris on the surface. We have been using Iodoflex under compression. 3/2; patient has an odd wound on the medial part of her mid calf. Some surrounding inflammation which could be venous inflammation. The wound is larger and deeper today. The patient had a reflux studies. She has greater saphenous vein reflux at the saphenofemoral junction as well as small saphenous vein reflux in the proximal calf and mid calf. There is no evidence of a DVT from the common femoral to the popliteal veins. I have referred her to vascular surgery for review of the reflux vis--vis the wound area on the left medial calf As the wound did not progress on a trajectory I went back and discussed biopsying this area with the patient which was one of the reasons her primary doctor sent her here. I told her the issue here would did make sure this is not an atypical ulcer either malignant or other types of inflammatory ulcers. In any case she refused. I told her that we would bring this up again next  week. 3/9; continued odd looking wound on the medial part of her right calf. Some surrounding inflammation which could be venous inflammation. The wound itself has open areas but also nonadherent skin with connecting small holes. I told her I wanted to biopsy this last week and she refused. Today I told her I wanted to remove some of the overhanging skin connecting the small holes to allow better dressing of the actual wound area. She also refused this. Finally she has significant reflux involving the small saphenous vein in the proximal calf and mid calf. I would like vascular surgery to review this for possible ablation but I do not have an appointment yet 3/16; odd looking wound on the medial part of her right calf surrounding inflammation which could be venous inflammation. We have been using silver alginate under compression but really making no headway here. She has some nonadherent surface epithelium with small holes in it. I do not think this is really viable but she has not let me either debride this or biopsy it. She brings up today that she was actually in this clinic in 2015 with wounds on the left leg although I have no information on this. She was apparently treated with a skin substitute with healing. Even if that were true I would need to debride this. She sees vein and vascular on 3/23 10/08/2019 upon evaluation today patient appears to be doing very well with regard to her ulcer on the lower extremity. She has been tolerating the dressing changes without complication. In fact this seems to be making excellent progress at this time. No fevers, chills, nausea, vomiting, or diarrhea. 3/30; wound is actually much better than 2 weeks ago. I found myself pleasantly surprised about this. Still surrounding venous inflammation. I am not sure where we are with the possibility of ablation. She apparently saw a vein and vascular on 3/23 4/6; the wounds I think is still somewhat improved  although she still has a a fairly large wound distally in the original area again with some small open holes on the periphery of it. She has a second smaller hole medially  and superiorly. We have been using silver alginate under compression Objective Constitutional Patient is hypertensive.. Pulse regular and within target range for patient.Marland Kitchen Respirations regular, non-labored and within target range.. Temperature is normal and within the target range for the patient.Marland Kitchen Appears in no distress. Vitals Time Taken: 8:41 AM, Height: 64 in, Source: Stated, Weight: 140 lbs, Source: Stated, BMI: 24, Temperature: 98.4 F, Pulse: 71 bpm, Respiratory Rate: 18 breaths/min, Blood Pressure: 154/77 mmHg, Capillary Blood Glucose: 180 mg/dl. Little Notes: glucose per pt report yesterday Little Notes: Wound exam; she has a area in the inferior part of this wound that is open. Several small open holes to the periphery and one superiorly and laterally. The exact cause of this is never been clear. She had skin that required removal with pickups and scissors hemostasis with direct pressure Integumentary (Hair, Skin) Wound #3 status is Open. Original cause of wound was Bump. The wound is located on the Right,Medial Lower Leg. The wound measures 0.2cm length x 0.2cm width x 0.1cm depth; 0.031cm^2 area and 0.003cm^3 volume. There is Fat Layer (Subcutaneous Tissue) Exposed exposed. There is tunneling at 7:00 with a maximum distance of 0.7cm. There is a small amount of purulent drainage noted. The wound margin is well defined and not attached to the wound base. There is large (67-100%) red granulation within the wound bed. There is no necrotic tissue within the wound bed. Wound #4 status is Open. Original cause of wound was Other Lesion. The wound is located on the Right,Distal,Medial Lower Leg. The wound measures 1.2cm length x 1.3cm width x 0.1cm depth; 1.225cm^2 area and 0.123cm^3 volume. There is Fat Layer  (Subcutaneous Tissue) Exposed exposed. There is no tunneling or undermining noted. There is a medium amount of purulent drainage noted. The wound margin is well defined and not attached to the wound base. There is large (67-100%) red granulation within the wound bed. There is no necrotic tissue within the wound bed. Assessment Active Problems ICD-10 Non-pressure chronic ulcer of right calf with fat layer exposed Chronic venous hypertension (idiopathic) with ulcer and inflammation of left lower extremity Type 2 diabetes mellitus with other skin ulcer Procedures Wound #4 Pre-procedure diagnosis of Wound #4 is a Venous Leg Ulcer located on the Right,Distal,Medial Lower Leg .Severity of Tissue Pre Debridement is: Fat layer exposed. There was a Selective/Open Wound Skin/Epidermis Debridement with a total area of 1.56 sq cm performed by Ricard Dillon., MD. With the following instrument(s): Forceps, and Scissors to remove Viable and Non-Viable tissue/material. Material removed includes Skin: Dermis and Skin: Epidermis and after achieving pain control using Lidocaine 5% topical ointment. No specimens were taken. A time out was conducted at 09:36, prior to the start of the procedure. A Minimum amount of bleeding was controlled with Pressure. The procedure was tolerated well with a pain level of 0 throughout and a pain level of 0 following the procedure. Post Debridement Measurements: 1.2cm length x 1.3cm width x 0.1cm depth; 0.123cm^3 volume. Character of Wound/Ulcer Post Debridement is improved. Severity of Tissue Post Debridement is: Fat layer exposed. Post procedure Diagnosis Wound #4: Same as Pre-Procedure Pre-procedure diagnosis of Wound #4 is a Venous Leg Ulcer located on the Right,Distal,Medial Lower Leg . There was a Three Layer Compression Therapy Procedure by Carlene Coria, RN. Post procedure Diagnosis Wound #4: Same as Pre-Procedure Wound #3 Pre-procedure diagnosis of Wound #3 is a  Venous Leg Ulcer located on the Right,Medial Lower Leg . There was a Three Layer Compression Therapy Procedure by Epps,  [employer] Hummingbird, Therapist, sports. Post procedure Diagnosis Wound #3: Same as Pre-Procedure Plan Follow-up Appointments: Return Appointment in 1 week. - Tuesday Dressing Change Frequency: Do not change entire dressing for one week. Skin Barriers/Peri-Wound Care: Moisturizing lotion TCA Cream or Ointment - mixed with lotion Wound Cleansing: May shower with protection. Primary Wound Dressing: Wound #3 Right,Medial Lower Leg: Calcium Alginate with Silver Wound #4 Right,Distal,Medial Lower Leg: Calcium Alginate with Silver Secondary Dressing: Wound #3 Right,Medial Lower Leg: Dry Gauze Edema Control: 3 Layer Compression System - Right Lower Extremity Avoid standing for long periods of time Elevate legs to the level of the heart or above for 30 minutes daily and/or when sitting, a frequency of: - throughout the day Exercise regularly 1. I am putting some TCA on the margins of this wound 2. I have never quite understood the pathology here. This may be chronic venous insufficiency however I would like to biopsy this area as she is never permitted it. 3. She came in last time with things looking absolutely completely better I have therefore continued with the silver alginate under compression. Electronic Signature(s) Signed: 10/21/2019 6:13:52 PM By: Linton Ham MD Entered By: Linton Ham on 10/21/2019 10:04:30 -------------------------------------------------------------------------------- SuperBill Details Patient Name: Date of Service: Krystal Little 10/21/2019 Medical Record [id number] Patient Account Number: [id number] Date of Birth/Sex: Treating RN: 12/10/1955 (64 y.o. [name] Primary Care Provider: Stark Klein Other Clinician: Referring Provider: Treating Provider/Extender:Elzada Pytel, Kathee Delton, MAKIERA Weeks in Treatment: 7 Diagnosis Coding ICD-10  Codes Code Description [phone] Non-pressure chronic ulcer of right calf with fat layer exposed I87.332 Chronic venous hypertension (idiopathic) with ulcer and inflammation of left lower extremity E11.622 Type 2 diabetes mellitus with other skin ulcer Facility Procedures CPT4 Code Description: [id number] - DEBRIDE WOUND 1ST 20 SQ CM OR < ICD-10 Diagnosis Description L97.212 Non-pressure chronic ulcer of right calf with fat layer e Modifier: xposed Quantity: 1 Physician Procedures CPT4 Code Description: [id number] - WC PHYS DEBR WO ANESTH 20 SQ CM ICD-10 Diagnosis Description L97.212 Non-pressure chronic ulcer of right calf with fat layer e Modifier: xposed Quantity: 1 Electronic Signature(s) Signed: 10/21/2019 6:13:52 PM By: Linton Ham MD Entered By: Linton Ham on 10/21/2019 10:04:43

## 2019-10-22 NOTE — Progress Notes (Signed)
JATOYA, ARMBRISTER ([id number]) Visit Report for 10/21/2019 Arrival Information Details Patient Name: Date of Service: Krystal Little, Krystal Little 10/21/2019 8:15 AM Medical Record [id number] Patient Account Number: [id number] Date of Birth/Sex: Treating RN: November 12, 1955 (64 y.o. Elam Dutch Primary Care Amariya Liskey: Stark Klein Other Clinician: Referring Terisa Belardo: Treating Ameir Faria/Extender:Robson, Kathee Delton, MAKIERA Weeks in Treatment: 7 Visit Information History Since Last Visit Added or deleted any medications: No Patient Arrived: Ambulatory Any new allergies or adverse reactions: No Arrival Time: 08:38 Had a fall or experienced change in No Accompanied By: self activities of daily living that may affect Transfer Assistance: None risk of falls: Patient Identification Verified: Yes Signs or symptoms of abuse/neglect since last No Secondary Verification Process Yes visito Completed: Hospitalized since last visit: No Patient Requires Transmission- No Implantable device outside of the clinic excluding No Based Precautions: cellular tissue based products placed in the center Patient Has Alerts: Yes since last visit: Patient Alerts: Patient on Blood Has Dressing in Place as Prescribed: Yes Thinner Has Compression in Place as Prescribed: Yes Pain Present Now: No Electronic Signature(s) Signed: 10/21/2019 6:21:28 PM By: Baruch Gouty RN, BSN Entered By: Baruch Gouty on 10/21/2019 08:39:44 -------------------------------------------------------------------------------- Compression Therapy Details Patient Name: Date of Service: Krystal Little 10/21/2019 8:15 AM Medical Record [id number] Patient Account Number: [id number] Date of Birth/Sex: Treating RN: 1955-10-24 (64 y.o. [name] Primary Care Lourie Retz: Stark Klein Other Clinician: Referring Alaine Loughney: Treating Corry Ihnen/Extender:Robson, Kathee Delton, MAKIERA Weeks in Treatment:  7 Compression Therapy Performed for Wound Wound #3 Right,Medial Lower Leg Assessment: Performed By: Clinician Carlene Coria, RN Compression Type: Three Layer Post Procedure Diagnosis Same as Pre-procedure Electronic Signature(s) Signed: 10/21/2019 6:43:44 PM By: Carlene Coria RN Entered By: Carlene Coria on 10/21/2019 09:38:56 -------------------------------------------------------------------------------- Compression Therapy Details Patient Name: Date of Service: YAHIRA, TIMBERMAN 10/21/2019 8:15 AM Medical Record [id number] Patient Account Number: [id number] Date of Birth/Sex: Treating RN: [birth year] (64 y.o. [name] Primary Care Dashanna Kinnamon: Stark Klein Other Clinician: Referring Mayola Mcbain: Treating Olympia Adelsberger/Extender:Robson, Kathee Delton, MAKIERA Weeks in Treatment: 7 Compression Therapy Performed for Wound Wound #4 Right,Distal,Medial Lower Leg Assessment: Performed By: Clinician Carlene Coria, RN Compression Type: Three Layer Post Procedure Diagnosis Same as Pre-procedure Electronic Signature(s) Signed: 10/21/2019 6:43:44 PM By: Carlene Coria RN Entered By: Carlene Coria on 10/21/2019 09:38:56 -------------------------------------------------------------------------------- Encounter Discharge Information Details Patient Name: Date of Service: Krystal Little, Krystal Little 10/21/2019 8:15 AM Medical Record [id number] Patient Account Number: [id number] Date of Birth/Sex: Treating RN: 02-Feb-1956 (64 y.o. [name] Primary Care Aryanna Shaver: Stark Klein Other Clinician: Referring Ciela Mahajan: Treating Aldena Worm/Extender:Robson, Kathee Delton, MAKIERA Weeks in Treatment: 7 Encounter Discharge Information Items Post Procedure Vitals Discharge Condition: Stable Temperature (F): 98.4 Ambulatory Status: Ambulatory Pulse (bpm): 71 Discharge Destination: Home Respiratory Rate (breaths/min): 18 Transportation: Private Auto Blood Pressure (mmHg): 154/77 Accompanied  By: self Schedule Follow-up Appointment: Yes Clinical Summary of Care: Patient Declined Electronic Signature(s) Signed: 10/21/2019 6:07:04 PM By: Kela Millin Entered By: Kela Millin on 10/21/2019 09:50:43 -------------------------------------------------------------------------------- Lower Extremity Assessment Details Patient Name: Date of Service: Krystal Little, Krystal Little 10/21/2019 8:15 AM Medical Record [id number] Patient Account Number: [id number] Date of Birth/Sex: Treating RN: 15-Feb-1956 (64 y.o. Elam Dutch Primary Care Dlisa Barnwell: Stark Klein Other Clinician: Referring Kaydee Magel: Treating Aceton Kinnear/Extender:Robson, Kathee Delton, MAKIERA Weeks in Treatment: 7 Edema Assessment Assessed: [Left: No] [Right: No] Edema: [Left: N] [Right: o] Calf Left: Right: Point of Measurement: 26 cm From Medial Instep cm 31.4 cm Ankle Left: Right: Point of Measurement: 11 cm From Medial Instep cm 20 cm Vascular  Assessment Pulses: Dorsalis Pedis Palpable: [Right:Yes] Electronic Signature(s) Signed: 10/21/2019 6:21:28 PM By: Baruch Gouty RN, BSN Entered By: Baruch Gouty on 10/21/2019 08:51:08 -------------------------------------------------------------------------------- Multi Wound Chart Details Patient Name: Date of Service: Krystal Little 10/21/2019 8:15 AM Medical Record [id number] Patient Account Number: [id number] Date of Birth/Sex: Treating RN: 07-Mar-1956 (64 y.o. [name] Primary Care Yalda Herd: Stark Klein Other Clinician: Referring Mynor Witkop: Treating Lathen Seal/Extender:Robson, Kathee Delton, MAKIERA Weeks in Treatment: 7 Vital Signs Height(in): 64 Capillary Blood 180 Glucose(mg/dl): Weight(lbs): 140 Pulse(bpm): 71 Body Mass Index(BMI): 24 Blood Pressure(mmHg): 154/77 Temperature(F): 98.4 Respiratory 18 Rate(breaths/min): Photos: [3:No Photos] [4:No Photos] [N/A:N/A] Wound Location: [3:Right, Medial Lower Leg] [4:Right,  Distal, Medial Lower N/A Leg] Wounding Event: [3:Bump] [4:Other Lesion] [N/A:N/A] Primary Etiology: [3:Venous Leg Ulcer] [4:Venous Leg Ulcer] [N/A:N/A] Secondary Etiology: [3:N/A] [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] Comorbid History: [3:Arrhythmia, Deep Vein Thrombosis, Type II Diabetes] [4:Arrhythmia, Deep Vein Thrombosis, Type II Diabetes] [N/A:N/A] Date Acquired: [3:05/18/2019] [4:10/08/2019] [N/A:N/A] Weeks of Treatment: [3:7] [4:1] [N/A:N/A] Wound Status: [3:Open] [4:Open] [N/A:N/A] Clustered Wound: [3:Yes] [4:No] [N/A:N/A] Measurements L x W x D 0.2x0.2x0.1 [4:1.2x1.3x0.1] [N/A:N/A] (cm) Area (cm) : [3:0.031] [4:1.225] [N/A:N/A] Volume (cm) : [3:0.003] [4:0.123] [N/A:N/A] % Reduction in Area: [3:99.60%] [4:-551.60%] [N/A:N/A] % Reduction in Volume: 99.60% [4:-547.40%] [N/A:N/A] Position 1 (o'clock): 7 Maximum Distance 1 [3:0.7] (cm): Tunneling: [3:Yes] [4:No] [N/A:N/A] Classification: [3:Full Thickness Without Exposed Support Structures Exposed Support Structures] [4:Full Thickness Without] [N/A:N/A] Exudate Amount: [3:Small] [4:Medium] [N/A:N/A] Exudate Type: [3:Purulent] [4:Purulent] [N/A:N/A] Exudate Color: [3:yellow, brown, green] [4:yellow, brown, green] [N/A:N/A] Wound Margin: [3:Well defined, not attached Well defined, not attached N/A] Granulation Amount: [3:Large (67-100%)] [4:Large (67-100%)] [N/A:N/A] Granulation Quality: [3:Red] [4:Red] [N/A:N/A] Necrotic Amount: [3:None Present (0%)] [4:None Present (0%)] [N/A:N/A] Exposed Structures: [3:Fat Layer (Subcutaneous Fat Layer (Subcutaneous N/A Tissue) Exposed: Yes Fascia: No Tendon: No Muscle: No Joint: No Bone: No] [4:Tissue) Exposed: Yes Fascia: No Tendon: No Muscle: No Joint: No Bone: No] Epithelialization: [3:None] [4:Small (1-33%)] [N/A:N/A] Debridement: [3:N/A] [4:Debridement - Excisional N/A] Pre-procedure [3:N/A] [4:09:36] [N/A:N/A] Verification/Time Out Taken: Pain Control: [3:N/A] [4:Lidocaine  5% topical ointment] [N/A:N/A] Tissue Debrided: [3:N/A] [4:Subcutaneous] [N/A:N/A] Level: [3:N/A] [4:Skin/Subcutaneous Tissue N/A] Debridement Area (sq cm):N/A [4:1.56] [N/A:N/A] Instrument: [3:N/A] [4:Forceps, Scissors] [N/A:N/A] Bleeding: [3:N/A] [4:Minimum] [N/A:N/A] Hemostasis Achieved: [3:N/A] [4:Pressure] [N/A:N/A] Procedural Pain: [3:N/A] [4:0] [N/A:N/A] Post Procedural Pain: [3:N/A] [4:0] [N/A:N/A] Debridement Treatment [3:N/A] [4:Procedure was tolerated] [N/A:N/A] Response: [4:well] Post Debridement [3:N/A] [4:1.2x1.3x0.1] [N/A:N/A] Measurements L x W x D (cm) Post Debridement [3:N/A] [4:0.123] [N/A:N/A] Volume: (cm) Procedures Performed: [3:Compression Therapy] [4:Compression Therapy Debridement] [N/A:N/A] Treatment Notes Wound #3 (Right, Medial Lower Leg) 1. Cleanse With Wound Cleanser Soap and water 2. Periwound Care Moisturizing lotion TCA Cream 3. Primary Dressing Applied Calcium Alginate Ag 4. Secondary Dressing ABD Pad Dry Gauze 6. Support Layer Applied 3 layer compression wrap Wound #4 (Right, Distal, Medial Lower Leg) 1. Cleanse With Wound Cleanser Soap and water 2. Periwound Care Moisturizing lotion TCA Cream 3. Primary Dressing Applied Calcium Alginate Ag 4. Secondary Dressing ABD Pad Dry Gauze 6. Support Layer Applied 3 layer compression wrap Electronic Signature(s) Signed: 10/21/2019 6:13:52 PM By: Linton Ham MD Signed: 10/21/2019 6:43:44 PM By: Carlene Coria RN Entered By: Linton Ham on 10/21/2019 10:00:25 -------------------------------------------------------------------------------- [place] Details Patient Name: Date of Service: Krystal Little, Krystal Little 10/21/2019 8:15 AM Medical Record [id number] Patient Account Number: [id number] Date of Birth/Sex: Treating RN: 04-Oct-1955 (64 y.o. [name] Primary Care Krisna Omar: Stark Klein Other Clinician: Referring Ernesto Zukowski: Treating Hinda Lindor/Extender:Robson,  Kathee Delton, MAKIERA Weeks in  Treatment: 7 Active Inactive Wound/Skin Impairment Nursing Diagnoses: Knowledge deficit related to ulceration/compromised skin integrity Goals: Patient/caregiver will verbalize understanding of skin care regimen Date Initiated: 09/02/2019 Target Resolution Date: 10/31/2019 Goal Status: Active Ulcer/skin breakdown will have a volume reduction of 30% by week 4 Date Initiated: 09/02/2019 Date Inactivated: 09/30/2019 Target Resolution Date: 09/30/2019 Unmet Reason: comorbities, Goal Status: Unmet pt declines biopsy, debridment Ulcer/skin breakdown will have a volume reduction of 50% by week 8 Date Initiated: 09/30/2019 Target Resolution Date: 10/31/2019 Goal Status: Active Interventions: Assess patient/caregiver ability to obtain necessary supplies Assess patient/caregiver ability to perform ulcer/skin care regimen upon admission and as needed Assess ulceration(s) every visit Notes: Electronic Signature(s) Signed: 10/21/2019 6:43:44 PM By: Carlene Coria RN Entered By: Carlene Coria on 10/21/2019 08:39:44 -------------------------------------------------------------------------------- Pain Assessment Details Patient Name: Date of Service: Krystal Little, Krystal Little 10/21/2019 8:15 AM Medical Record [id number] Patient Account Number: [id number] Date of Birth/Sex: Treating RN: 1956/06/07 (64 y.o. Elam Dutch Primary Care Heleena Miceli: Stark Klein Other Clinician: Referring Daimon Kean: Treating Ryo Klang/Extender:Robson, Kathee Delton, MAKIERA Weeks in Treatment: 7 Active Problems Location of Pain Severity and Description of Pain Patient Has Paino No Site Locations Rate the pain. Current Pain Level: 0 Pain Management and Medication Current Pain Management: Electronic Signature(s) Signed: 10/21/2019 6:21:28 PM By: Baruch Gouty RN, BSN Entered By: Baruch Gouty on 10/21/2019  08:50:48 -------------------------------------------------------------------------------- Patient/Caregiver Education Details Patient Name: Date of Service: Krystal Little 4/6/2021andnbsp8:15 AM Medical Record [id number] Patient Account Number: [id number] Date of Birth/Gender: Treating RN: 23-Nov-1955 (64 y.o. [name] Primary Care Physician: Stark Klein Other Clinician: Referring Physician: Treating Physician/Extender:Robson, Kathee Delton, Danise Mina in Treatment: 7 Education Assessment Education Provided To: Patient Education Topics Provided Wound/Skin Impairment: Methods: Explain/Verbal Responses: State content correctly Electronic Signature(s) Signed: 10/21/2019 6:43:44 PM By: Carlene Coria RN Entered By: Carlene Coria on 10/21/2019 08:40:05 -------------------------------------------------------------------------------- Wound Assessment Details Patient Name: Date of Service: Krystal Little, Krystal Little 10/21/2019 8:15 AM Medical Record [id number] Patient Account Number: [id number] Date of Birth/Sex: Treating RN: [birth year] (64 y.o. Elam Dutch Primary Care Ashritha Desrosiers: Stark Klein Other Clinician: Referring Sekou Zuckerman: Treating Ved Martos/Extender:Robson, Kathee Delton, MAKIERA Weeks in Treatment: 7 Wound Status Wound Number: 3 Primary Venous Leg Ulcer Etiology: Wound Location: Right, Medial Lower Leg Wound Open Wounding Event: Bump Status: Date Acquired: 05/18/2019 Comorbid Arrhythmia, Deep Vein Thrombosis, Type Weeks Of Treatment: 7 History: II Diabetes Clustered Wound: Yes Wound Measurements Length: (cm) 0.2 Width: (cm) 0.2 Depth: (cm) 0.1 Area: (cm) 0.031 Volume: (cm) 0.003 % Reduction in Area: 99.6% % Reduction in Volume: 99.6% Epithelialization: None Tunneling: Yes Position (o'clock): 7 Maximum Distance: (cm) 0.7 Wound Description Classification: Full Thickness Without Exposed Support Foul Odo Structures  Slough/F Wound Well defined, not attached Margin: Exudate Small Amount: Exudate Purulent Type: Exudate yellow, brown, green Color: Wound Bed Granulation Amount: Large (67-100%) Granulation Quality: Red Fascia E Necrotic Amount: None Present (0%) Fat Laye Tendon E Muscle E Joint Ex Bone Exp r After Cleansing: No ibrino No Exposed Structure xposed: No r (Subcutaneous Tissue) Exposed: Yes xposed: No xposed: No posed: No osed: No Treatment Notes Wound #3 (Right, Medial Lower Leg) 1. Cleanse With Wound Cleanser Soap and water 2. Periwound Care Moisturizing lotion TCA Cream 3. Primary Dressing Applied Calcium Alginate Ag 4. Secondary Dressing ABD Pad Dry Gauze 6. Support Layer Applied 3 layer compression Water quality scientist) Signed: 10/21/2019 6:21:28 PM By: Baruch Gouty RN, BSN Entered By: Baruch Gouty on 10/21/2019 08:57:06 -------------------------------------------------------------------------------- Wound Assessment Details Patient Name: Date of Service: Krystal Little 10/21/2019  8:15 AM Medical Record [id number] Patient Account Number: [id number] Date of Birth/Sex: Treating RN: 05-09-1956 (64 y.o. Elam Dutch Primary Care Alexsis Kathman: Stark Klein Other Clinician: Referring Zacarias Krauter: Treating Brodyn Depuy/Extender:Robson, Kathee Delton, MAKIERA Weeks in Treatment: 7 Wound Status Wound Number: 4 Primary Venous Leg Ulcer Etiology: Wound Location: Right, Distal, Medial Lower Leg Secondary Diabetic Wound/Ulcer of the Lower Wounding Event: Other Lesion Etiology: Extremity Date Acquired: 10/08/2019 Wound Status: Open Weeks Of Treatment: 1 Comorbid Arrhythmia, Deep Vein Thrombosis, Clustered Wound: No History: Type II Diabetes Wound Measurements Length: (cm) 1.2 % Reduct Width: (cm) 1.3 % Reduct Depth: (cm) 0.1 Epitheli Area: (cm) 1.225 Tunneli Volume: (cm) 0.123 Undermi Wound Description Full Thickness Without Exposed  Support Classification: Structures Wound Well defined, not attached Margin: Exudate Medium Amount: Exudate Purulent Type: Exudate yellow, brown, green Color: Wound Bed Granulation Amount: Large (67-100%) Granulation Quality: Red Necrotic Amount: None Present (0%) Foul Odor After Cleansing: No Slough/Fibrino No Exposed Structure Fascia Exposed: No Fat Layer (Subcutaneous Tissue) Exposed: Yes Tendon Exposed: No Muscle Exposed: No Joint Exposed: No Bone Exposed: No ion in Area: -551.6% ion in Volume: -547.4% alization: Small (1-33%) ng: No ning: No Treatment Notes Wound #4 (Right, Distal, Medial Lower Leg) 1. Cleanse With Wound Cleanser Soap and water 2. Periwound Care Moisturizing lotion TCA Cream 3. Primary Dressing Applied Calcium Alginate Ag 4. Secondary Dressing ABD Pad Dry Gauze 6. Support Layer Applied 3 layer compression Water quality scientist) Signed: 10/21/2019 6:21:28 PM By: Baruch Gouty RN, BSN Entered By: Baruch Gouty on 10/21/2019 08:56:31 -------------------------------------------------------------------------------- [place] Details Patient Name: Date of Service: Krystal Little 10/21/2019 8:15 AM Medical Record [id number] Patient Account Number: [id number] Date of Birth/Sex: Treating RN: 06-12-1956 (64 y.o. Elam Dutch Primary Care Lindsee Labarre: Stark Klein Other Clinician: Referring Hollynn Garno: Treating Mikhayla Phillis/Extender:Robson, Kathee Delton, MAKIERA Weeks in Treatment: 7 Vital Signs Time Taken: 08:41 Temperature (F): 98.4 Height (in): 64 Pulse (bpm): 71 Source: Stated Respiratory Rate (breaths/min): 18 Weight (lbs): 140 Blood Pressure (mmHg): 154/77 Source: Stated Capillary Blood Glucose (mg/dl): 180 Body Mass Index (BMI): 24 Reference Range: 80 - 120 mg / dl Notes glucose per pt report yesterday Electronic Signature(s) Signed: 10/21/2019 6:21:28 PM By: Baruch Gouty RN, BSN Entered By: Baruch Gouty  on 10/21/2019 08:52:07

## 2019-10-23 ENCOUNTER — Ambulatory Visit: Payer: Medicare Other

## 2019-10-25 ENCOUNTER — Ambulatory Visit: Payer: Medicare Other | Attending: Internal Medicine

## 2019-10-25 DIAGNOSIS — Z23 Encounter for immunization: Secondary | ICD-10-CM

## 2019-10-25 NOTE — Progress Notes (Signed)
   Covid-19 Vaccination Clinic  Name:  Krystal Little    MRN: [id number] DOB: Sep 14, 1955  10/25/2019  [name] was observed post Covid-19 immunization for 15 minutes without incident. She was provided with Vaccine Information Sheet and instruction to access the V-Safe system.   [name] was instructed to call 911 with any severe reactions post vaccine: Marland Kitchen Difficulty breathing  . Swelling of face and throat  . A fast heartbeat  . A bad rash all over body  . Dizziness and weakness   Immunizations Administered    Name Date Dose VIS Date Route   Pfizer COVID-19 Vaccine 10/25/2019  3:05 PM 0.3 mL 06/27/2019 Intramuscular   Manufacturer: [place]   Lot: [id number]   [place]: [id number]

## 2019-10-28 ENCOUNTER — Encounter (HOSPITAL_BASED_OUTPATIENT_CLINIC_OR_DEPARTMENT_OTHER): Payer: Medicare Other | Admitting: Internal Medicine

## 2019-10-28 ENCOUNTER — Other Ambulatory Visit: Payer: Self-pay

## 2019-10-28 DIAGNOSIS — I87332 Chronic venous hypertension (idiopathic) with ulcer and inflammation of left lower extremity: Secondary | ICD-10-CM | POA: Diagnosis not present

## 2019-10-28 DIAGNOSIS — G8194 Hemiplegia, unspecified affecting left nondominant side: Secondary | ICD-10-CM | POA: Diagnosis not present

## 2019-10-28 DIAGNOSIS — E11622 Type 2 diabetes mellitus with other skin ulcer: Secondary | ICD-10-CM | POA: Diagnosis not present

## 2019-10-28 DIAGNOSIS — I63411 Cerebral infarction due to embolism of right middle cerebral artery: Secondary | ICD-10-CM | POA: Diagnosis not present

## 2019-10-28 DIAGNOSIS — L97212 Non-pressure chronic ulcer of right calf with fat layer exposed: Secondary | ICD-10-CM | POA: Diagnosis not present

## 2019-10-28 NOTE — Progress Notes (Signed)
SHYNE, RESCH ([id number]) Visit Report for 10/28/2019 HPI Details Patient Name: Date of Service: Krystal Little, Krystal Little 10/28/2019 8:15 AM Medical Record [id number] Patient Account Number: [id number] Date of Birth/Sex: Treating RN: 10/02/1955 (64 y.o. Krystal Little Primary Care Provider: Stark Klein Other Clinician: Referring Provider: Treating Provider/Extender:Adhrit Krenz, Kathee Delton, MAKIERA Weeks in Treatment: 8 History of Present Illness HPI Description: ADMISSION 09/02/2019 This is a 64 year old woman who is a poorly controlled type II diabetic with a recent hemoglobin A1c of 12.2. She tells [place] that she developed a wound on her right medial calf sometime in November. She is not exactly sure how this happened. She wonders if she got bit by something. She went to the ER at the end of December and they felt this was an abscess. They gave her doxycycline they did not culture this and she used topical Neosporin. At that point it was apparently draining. On 08/18/2019 she saw her primary physician at family practice. Correctly noted that this was probably venous insufficiency but listed in the differential for an inflammatory ulcer of uncertain etiology and wondered about a biopsy. They gave her an additional 10 days of doxycycline and she has been using triple antibiotics. She says the wound actually has been getting larger. Past medical history includes type 2 diabetes uncontrolled, chronic atrial fibrillation on Coumadin, status post mitral valve replacement, pacemaker, hyperlipidemia and hypertension. ABIs in our clinic were 0.80 on the right however the patient's pulses in her foot at both the dorsalis pedis and posterior tibial are vibrant 2/23; patient wound actually looks somewhat better. Still several open areas. Debris on the surface. We have been using Iodoflex under compression. 3/2; patient has an odd wound on the medial part of her mid calf. Some surrounding  inflammation which could be venous inflammation. The wound is larger and deeper today. The patient had a reflux studies. She has greater saphenous vein reflux at the saphenofemoral junction as well as small saphenous vein reflux in the proximal calf and mid calf. There is no evidence of a DVT from the common femoral to the popliteal veins. I have referred her to vascular surgery for review of the reflux vis--vis the wound area on the left medial calf As the wound did not progress on a trajectory I went back and discussed biopsying this area with the patient which was one of the reasons her primary doctor sent her here. I told her the issue here would did make sure this is not an atypical ulcer either malignant or other types of inflammatory ulcers. In any case she refused. I told her that we would bring this up again next week. 3/9; continued odd looking wound on the medial part of her right calf. Some surrounding inflammation which could be venous inflammation. The wound itself has open areas but also nonadherent skin with connecting small holes. I told her I wanted to biopsy this last week and she refused. Today I told her I wanted to remove some of the overhanging skin connecting the small holes to allow better dressing of the actual wound area. She also refused this. Finally she has significant reflux involving the small saphenous vein in the proximal calf and mid calf. I would like vascular surgery to review this for possible ablation but I do not have an appointment yet 3/16; odd looking wound on the medial part of her right calf surrounding inflammation which could be venous inflammation. We have been using silver alginate under compression but really making no headway  here. She has some nonadherent surface epithelium with small holes in it. I do not think this is really viable but she has not let me either debride this or biopsy it. She brings up today that she was actually in this clinic  in 2015 with wounds on the left leg although I have no information on this. She was apparently treated with a skin substitute with healing. Even if that were true I would need to debride this. She sees vein and vascular on 3/23 10/08/2019 upon evaluation today patient appears to be doing very well with regard to her ulcer on the lower extremity. She has been tolerating the dressing changes without complication. In fact this seems to be making excellent progress at this time. No fevers, chills, nausea, vomiting, or diarrhea. 3/30; wound is actually much better than 2 weeks ago. I found myself pleasantly surprised about this. Still surrounding venous inflammation. I am not sure where we are with the possibility of ablation. She apparently saw a vein and vascular on 3/23 4/6; the wounds I think is still somewhat improved although she still has a a fairly large wound distally in the original area again with some small open holes on the periphery of it. She has a second smaller hole medially and superiorly. We have been using silver alginate under compression 4/13; the wounds on the right medial lower calf. This was initially a fairly large irregular wound that had an atypical appearance. Initially seen by primary care who felt the same thing. The patient would not let me biopsied this area we simply put standard dressings on this under compression she is healed. She saw Dr. Carlis Abbott last month at vein and vascular and apparently she has a 9-month follow-up to consider ablation. He recommended thigh-high stockings which the patient is not going to wear. We are going to give her below-knee stockings. Hopefully she will be compliant with this. She has significant reflux in the small saphenous vein Electronic Signature(s) Signed: 10/28/2019 5:51:00 PM By: Linton Ham MD Entered By: Linton Ham on 10/28/2019  09:13:26 -------------------------------------------------------------------------------- Physical Exam Details Patient Name: Date of Service: Krystal Little, GOODLIN 10/28/2019 8:15 AM Medical Record [id number] Patient Account Number: [id number] Date of Birth/Sex: Treating RN: 19-Dec-1955 (64 y.o. Krystal Little Primary Care Provider: Stark Klein Other Clinician: Referring Provider: Treating Provider/Extender:Matej Sappenfield, Kathee Delton, MAKIERA Weeks in Treatment: 8 Constitutional Patient is hypertensive.. Pulse regular and within target range for patient.Marland Kitchen Respirations regular, non-labored and within target range.. Temperature is normal and within the target range for the patient.Marland Kitchen Appears in no distress. Eyes Conjunctivae clear. No discharge.no icterus. Respiratory work of breathing is normal. Cardiovascular Pedal pulses palpable. Integumentary (Hair, Skin) Inflammation around this area. Notes Wound exam; the area is totally closed. Surrounding inflammation this is on the right medial lower calf. Everything appears to be epithelialized and intact. Electronic Signature(s) Signed: 10/28/2019 5:51:00 PM By: Linton Ham MD Entered By: Linton Ham on 10/28/2019 09:15:41 -------------------------------------------------------------------------------- Physician Orders Details Patient Name: Date of Service: MELONY, TENPAS 10/28/2019 8:15 AM Medical Record [id number] Patient Account Number: [id number] Date of Birth/Sex: Treating RN: 1955-09-06 (64 y.o. Krystal Little, Meta.Reding Primary Care Provider: Stark Klein Other Clinician: Referring Provider: Treating Provider/Extender:Arleatha Philipps, Kathee Delton, MAKIERA Weeks in Treatment: 8 Verbal / Phone Orders: No Diagnosis Coding ICD-10 Coding Code Description [id number] Non-pressure chronic ulcer of right calf with fat layer exposed I87.332 Chronic venous hypertension (idiopathic) with ulcer and inflammation of left lower  extremity E11.622 Type 2 diabetes mellitus with other skin  ulcer Discharge From [facility] Services Discharge from [place] - call if any future wound care needs. Skin Barriers/Peri-Wound Care Moisturizing lotion - both legs nightly. Edema Control Avoid standing for long periods of time Elevate legs to the level of the heart or above for 30 minutes daily and/or when sitting, a frequency of: - throughout the day. Exercise regularly Support Garment 20-30 mm/Hg pressure to: - patient to purchase compression stockings. Apply in the morning and remove at night. Electronic Signature(s) Signed: 10/28/2019 5:51:00 PM By: Linton Ham MD Signed: 10/28/2019 6:03:05 PM By: Deon Pilling Entered By: Deon Pilling on 10/28/2019 09:09:26 -------------------------------------------------------------------------------- Problem List Details Patient Name: Date of Service: SHAUNAE, SIELOFF 10/28/2019 8:15 AM Medical Record [id number] Patient Account Number: [id number] Date of Birth/Sex: Treating RN: [birth year] (64 y.o. Krystal Little Primary Care Provider: Stark Klein Other Clinician: Referring Provider: Treating Provider/Extender:Sameerah Nachtigal, Kathee Delton, MAKIERA Weeks in Treatment: 8 Active Problems ICD-10 Evaluated Encounter Code Description Active Date Today Diagnosis L97.212 Non-pressure chronic ulcer of right calf with fat layer 09/02/2019 No Yes exposed I87.332 Chronic venous hypertension (idiopathic) with ulcer 09/02/2019 No Yes and inflammation of left lower extremity E11.622 Type 2 diabetes mellitus with other skin ulcer 09/02/2019 No Yes Inactive Problems Resolved Problems Electronic Signature(s) Signed: 10/28/2019 5:51:00 PM By: Linton Ham MD Entered By: Linton Ham on 10/28/2019 09:11:25 -------------------------------------------------------------------------------- Progress Note Details Patient Name: Date of Service: [name] 10/28/2019 8:15  AM Medical Record [id number] Patient Account Number: [id number] Date of Birth/Sex: Treating RN: 1955-12-28 (64 y.o. Krystal Little Primary Care Provider: Stark Klein Other Clinician: Referring Provider: Treating Provider/Extender:Melroy Bougher, Kathee Delton, MAKIERA Weeks in Treatment: 8 Subjective History of Present Illness (HPI) ADMISSION 09/02/2019 This is a 64 year old woman who is a poorly controlled type II diabetic with a recent hemoglobin A1c of 12.2. She tells [place] that she developed a wound on her right medial calf sometime in November. She is not exactly sure how this happened. She wonders if she got bit by something. She went to the ER at the end of December and they felt this was an abscess. They gave her doxycycline they did not culture this and she used topical Neosporin. At that point it was apparently draining. On 08/18/2019 she saw her primary physician at family practice. Correctly noted that this was probably venous insufficiency but listed in the differential for an inflammatory ulcer of uncertain etiology and wondered about a biopsy. They gave her an additional 10 days of doxycycline and she has been using triple antibiotics. She says the wound actually has been getting larger. Past medical history includes type 2 diabetes uncontrolled, chronic atrial fibrillation on Coumadin, status post mitral valve replacement, pacemaker, hyperlipidemia and hypertension. ABIs in our clinic were 0.80 on the right however the patient's pulses in her foot at both the dorsalis pedis and posterior tibial are vibrant 2/23; patient wound actually looks somewhat better. Still several open areas. Debris on the surface. We have been using Iodoflex under compression. 3/2; patient has an odd wound on the medial part of her mid calf. Some surrounding inflammation which could be venous inflammation. The wound is larger and deeper today. The patient had a reflux studies. She has greater  saphenous vein reflux at the saphenofemoral junction as well as small saphenous vein reflux in the proximal calf and mid calf. There is no evidence of a DVT from the common femoral to the popliteal veins. I have referred her to vascular surgery for review of the reflux vis--vis the  wound area on the left medial calf As the wound did not progress on a trajectory I went back and discussed biopsying this area with the patient which was one of the reasons her primary doctor sent her here. I told her the issue here would did make sure this is not an atypical ulcer either malignant or other types of inflammatory ulcers. In any case she refused. I told her that we would bring this up again next week. 3/9; continued odd looking wound on the medial part of her right calf. Some surrounding inflammation which could be venous inflammation. The wound itself has open areas but also nonadherent skin with connecting small holes. I told her I wanted to biopsy this last week and she refused. Today I told her I wanted to remove some of the overhanging skin connecting the small holes to allow better dressing of the actual wound area. She also refused this. Finally she has significant reflux involving the small saphenous vein in the proximal calf and mid calf. I would like vascular surgery to review this for possible ablation but I do not have an appointment yet 3/16; odd looking wound on the medial part of her right calf surrounding inflammation which could be venous inflammation. We have been using silver alginate under compression but really making no headway here. She has some nonadherent surface epithelium with small holes in it. I do not think this is really viable but she has not let me either debride this or biopsy it. She brings up today that she was actually in this clinic in 2015 with wounds on the left leg although I have no information on this. She was apparently treated with a skin substitute with  healing. Even if that were true I would need to debride this. She sees vein and vascular on 3/23 10/08/2019 upon evaluation today patient appears to be doing very well with regard to her ulcer on the lower extremity. She has been tolerating the dressing changes without complication. In fact this seems to be making excellent progress at this time. No fevers, chills, nausea, vomiting, or diarrhea. 3/30; wound is actually much better than 2 weeks ago. I found myself pleasantly surprised about this. Still surrounding venous inflammation. I am not sure where we are with the possibility of ablation. She apparently saw a vein and vascular on 3/23 4/6; the wounds I think is still somewhat improved although she still has a a fairly large wound distally in the original area again with some small open holes on the periphery of it. She has a second smaller hole medially and superiorly. We have been using silver alginate under compression 4/13; the wounds on the right medial lower calf. This was initially a fairly large irregular wound that had an atypical appearance. Initially seen by primary care who felt the same thing. The patient would not let me biopsied this area we simply put standard dressings on this under compression she is healed. She saw Dr. Carlis Abbott last month at vein and vascular and apparently she has a 14-month follow-up to consider ablation. He recommended thigh-high stockings which the patient is not going to wear. We are going to give her below-knee stockings. Hopefully she will be compliant with this. She has significant reflux in the small saphenous vein Objective Constitutional Patient is hypertensive.. Pulse regular and within target range for patient.Marland Kitchen Respirations regular, non-labored and within target range.. Temperature is normal and within the target range for the patient.Marland Kitchen Appears in no distress. Vitals Time  Taken: 8:48 AM, Height: 64 in, Weight: 140 lbs, BMI: 24, Temperature: 98.1  F, Pulse: 73 bpm, Respiratory Rate: 18 breaths/min, Blood Pressure: 147/80 mmHg, Capillary Blood Glucose: 157 mg/dl. Eyes Conjunctivae clear. No discharge.no icterus. Respiratory work of breathing is normal. Cardiovascular Pedal pulses palpable. General Notes: Wound exam; the area is totally closed. Surrounding inflammation this is on the right medial lower calf. Everything appears to be epithelialized and intact. Integumentary (Hair, Skin) Inflammation around this area. Wound #3 status is Open. Original cause of wound was Bump. The wound is located on the Right,Medial Lower Leg. The wound measures 0cm length x 0cm width x 0cm depth; 0cm^2 area and 0cm^3 volume. There is no tunneling or undermining noted. There is a none present amount of drainage noted. The wound margin is well defined and not attached to the wound base. There is no granulation within the wound bed. There is no necrotic tissue within the wound bed. Wound #4 status is Healed - Epithelialized. Original cause of wound was Other Lesion. The wound is located on the Right,Distal,Medial Lower Leg. The wound measures 0cm length x 0cm width x 0cm depth; 0cm^2 area and 0cm^3 volume. The wound is limited to skin breakdown. There is no tunneling or undermining noted. There is a small amount of serosanguineous drainage noted. The wound margin is well defined and not attached to the wound base. There is no granulation within the wound bed. There is no necrotic tissue within the wound bed. Assessment Active Problems ICD-10 Non-pressure chronic ulcer of right calf with fat layer exposed Chronic venous hypertension (idiopathic) with ulcer and inflammation of left lower extremity Type 2 diabetes mellitus with other skin ulcer Plan Discharge From [facility] Services: Discharge from [place] - call if any future wound care needs. Skin Barriers/Peri-Wound Care: Moisturizing lotion - both legs nightly. Edema Control: Avoid standing  for long periods of time Elevate legs to the level of the heart or above for 30 minutes daily and/or when sitting, a frequency of: - throughout the day. Exercise regularly Support Garment 20-30 mm/Hg pressure to: - patient to purchase compression stockings. Apply in the morning and remove at night. 1. I have not reviewed Dr. Ainsley Spinner notes on this patient so she has a follow-up in 3 months for consideration of ablation 2. I have advised her to keep this area protected with a thick Band-Aid and we have given her the instructions to call elastic therapy for stockings 3. I am not optimistic she is going to be compliant with her stockings 4. The wound itself was very irregular and atypical when she first came in here. She has surrounding erythema around the wound but no real tenderness. I had wanted to biopsy this but she would not allow it 5. I am discharging her from the clinic likely a chronic venous inflammation type wound Electronic Signature(s) Signed: 10/28/2019 5:51:00 PM By: Linton Ham MD Entered By: Linton Ham on 10/28/2019 09:22:20 -------------------------------------------------------------------------------- SuperBill Details Patient Name: Date of Service: [name] 10/28/2019 Medical Record [id number] Patient Account Number: [id number] Date of Birth/Sex: Treating RN: [birth year] (64 y.o. Krystal Little, Meta.Reding Primary Care Provider: Stark Klein Other Clinician: Referring Provider: Treating Provider/Extender:Jadiel Schmieder, Kathee Delton, MAKIERA Weeks in Treatment: 8 Diagnosis Coding ICD-10 Codes Code Description [id number] Non-pressure chronic ulcer of right calf with fat layer exposed I87.332 Chronic venous hypertension (idiopathic) with ulcer and inflammation of left lower extremity E11.622 Type 2 diabetes mellitus with other skin ulcer Facility Procedures The patient participates with Medicare or  their insurance follows the Medicare Facility Guidelines:  CPT4 Code Description Modifier Quantity [id number] [place] VISIT-LEV 4 EST PT 1 Physician Procedures Electronic Signature(s) Signed: 10/28/2019 5:51:00 PM By: Linton Ham MD Entered By: Linton Ham on 10/28/2019 09:22:45

## 2019-10-29 NOTE — Progress Notes (Signed)
Krystal Little, Krystal Little ([id number]) Visit Report for 10/28/2019 Arrival Information Details Patient Name: Date of Service: Krystal Little, Krystal Little 10/28/2019 8:15 AM Medical Record [id number] Patient Account Number: [id number] Date of Birth/Sex: Treating RN: [birth year] (64 y.o. Krystal Little Primary Care Krystal Little: Krystal Little Other Clinician: Referring Krystal Little: Treating Krystal Little/Extender:Krystal Little, Krystal Little, Krystal Little in Treatment: 8 Visit Information History Since Last Visit Added or deleted any medications: No Patient Arrived: Ambulatory Any new allergies or adverse reactions: No Arrival Time: 08:46 Had a fall or experienced change in No Accompanied By: self activities of daily living that may affect Transfer Assistance: None risk of falls: Patient Identification Verified: Yes Signs or symptoms of abuse/neglect since last No Secondary Verification Process Yes visito Completed: Hospitalized since last visit: No Patient Requires Transmission- No Implantable device outside of the clinic excluding No Based Precautions: cellular tissue based products placed in the center Patient Has Alerts: Yes since last visit: Patient Alerts: Patient on Blood Has Dressing in Place as Prescribed: Yes Thinner Pain Present Now: No Electronic Signature(s) Signed: 10/29/2019 9:19:04 AM By: Sandre Kitty Entered By: Sandre Kitty on 10/28/2019 08:47:13 -------------------------------------------------------------------------------- Clinic Level of Care Assessment Details Patient Name: Date of Service: Krystal Little, Krystal Little 10/28/2019 8:15 AM Medical Record [id number] Patient Account Number: [id number] Date of Birth/Sex: Treating RN: 1955-09-16 (64 y.o. Krystal Little, Meta.Reding Primary Care Jayven Naill: Krystal Little Other Clinician: Referring Lumen Brinlee: Treating Ashok Sawaya/Extender:Krystal Little, Krystal Little, Krystal Little in Treatment: 8 Clinic Level of Care Assessment Items TOOL 4  Quantity Score X - Use when only an EandM is performed on FOLLOW-UP visit 1 0 ASSESSMENTS - Nursing Assessment / Reassessment X - Reassessment of Co-morbidities (includes updates in patient status) 1 10 X - Reassessment of Adherence to Treatment Plan 1 5 ASSESSMENTS - Wound and Skin Assessment / Reassessment []  - Simple Wound Assessment / Reassessment - one wound 0 X - Complex Wound Assessment / Reassessment - multiple wounds 2 5 X - Dermatologic / Skin Assessment (not related to wound area) 1 10 ASSESSMENTS - Focused Assessment X - Circumferential Edema Measurements - multi extremities 1 5 X - Nutritional Assessment / Counseling / Intervention 1 10 []  - Lower Extremity Assessment (monofilament, tuning fork, pulses) 0 []  - Peripheral Arterial Disease Assessment (using hand held doppler) 0 ASSESSMENTS - Ostomy and/or Continence Assessment and Care []  - Incontinence Assessment and Management 0 []  - Ostomy Care Assessment and Management (repouching, etc.) 0 PROCESS - Coordination of Care []  - Simple Patient / Family Education for ongoing care 0 X - Complex (extensive) Patient / Family Education for ongoing care 1 20 X - Staff obtains Programmer, systems, Records, Test Results / Process Orders 1 10 []  - Staff telephones HHA, Nursing Homes / Clarify orders / etc 0 []  - Routine Transfer to another Facility (non-emergent condition) 0 []  - Routine Hospital Admission (non-emergent condition) 0 []  - New Admissions / Biomedical engineer / Ordering NPWT, Apligraf, etc. 0 []  - Emergency Hospital Admission (emergent condition) 0 []  - Simple Discharge Coordination 0 X - Complex (extensive) Discharge Coordination 1 15 PROCESS - Special Needs []  - Pediatric / Minor Patient Management 0 []  - Isolation Patient Management 0 []  - Hearing / Language / Visual special needs 0 []  - Assessment of Community assistance (transportation, D/C planning, etc.) 0 []  - Additional assistance / Altered mentation 0 []  -  Support Surface(s) Assessment (bed, cushion, seat, etc.) 0 INTERVENTIONS - Wound Cleansing / Measurement []  - Simple Wound Cleansing - one wound 0 X - Complex Wound Cleansing -  multiple wounds 2 5 X - Wound Imaging (photographs - any number of wounds) 1 5 []  - Wound Tracing (instead of photographs) 0 []  - Simple Wound Measurement - one wound 0 X - Complex Wound Measurement - multiple wounds 2 5 INTERVENTIONS - Wound Dressings []  - Small Wound Dressing one or multiple wounds 0 []  - Medium Wound Dressing one or multiple wounds 0 []  - Large Wound Dressing one or multiple wounds 0 []  - Application of Medications - topical 0 []  - Application of Medications - injection 0 INTERVENTIONS - Miscellaneous []  - External ear exam 0 []  - Specimen Collection (cultures, biopsies, blood, body fluids, etc.) 0 []  - Specimen(s) / Culture(s) sent or taken to Lab for analysis 0 []  - Patient Transfer (multiple staff / Civil Service fast streamer / Similar devices) 0 []  - Simple Staple / Suture removal (25 or less) 0 []  - Complex Staple / Suture removal (26 or more) 0 []  - Hypo / Hyperglycemic Management (close monitor of Blood Glucose) 0 []  - Ankle / Brachial Index (ABI) - do not check if billed separately 0 X - Vital Signs 1 5 Has the patient been seen at the hospital within the last three years: Yes Total Score: 125 Level Of Care: New/Established - Level 4 Electronic Signature(s) Signed: 10/28/2019 6:03:05 PM By: Deon Pilling Entered By: Deon Pilling on 10/28/2019 09:13:28 -------------------------------------------------------------------------------- Encounter Discharge Information Details Patient Name: Date of Service: Krystal Little 10/28/2019 8:15 AM Medical Record [id number] Patient Account Number: [id number] Date of Birth/Sex: Treating RN: June 15, 1956 (64 y.o. Krystal Little Primary Care Krystal Little: Krystal Little Other Clinician: Referring Krystal Little: Treating Krystal Little/Extender:Krystal Little,  Krystal Little, Krystal Little in Treatment: 8 Encounter Discharge Information Items Discharge Condition: Stable Ambulatory Status: Ambulatory Discharge Destination: Home Transportation: Private Auto Accompanied By: self Schedule Follow-up Appointment: No Clinical Summary of Care: Electronic Signature(s) Signed: 10/28/2019 6:03:05 PM By: Deon Pilling Entered By: Deon Pilling on 10/28/2019 09:13:54 -------------------------------------------------------------------------------- Lower Extremity Assessment Details Patient Name: Date of Service: Krystal Little, Krystal Little 10/28/2019 8:15 AM Medical Record [id number] Patient Account Number: [id number] Date of Birth/Sex: Treating RN: 1955-12-10 (64 y.o. Elam Dutch Primary Care Chester Sibert: Krystal Little Other Clinician: Referring Cy Bresee: Treating Rogina Schiano/Extender:Krystal Little, Krystal Little, Krystal Little in Treatment: 8 Edema Assessment Assessed: [Left: No] [Right: No] Edema: [Left: N] [Right: o] Calf Left: Right: Point of Measurement: 26 cm From Medial Instep cm 31.4 cm Ankle Left: Right: Point of Measurement: 11 cm From Medial Instep cm 20 cm Vascular Assessment Pulses: Dorsalis Pedis Palpable: [Right:Yes] Electronic Signature(s) Signed: 10/28/2019 6:08:25 PM By: Baruch Gouty RN, BSN Entered By: Baruch Gouty on 10/28/2019 08:57:44 -------------------------------------------------------------------------------- Multi Wound Chart Details Patient Name: Date of Service: Krystal Little 10/28/2019 8:15 AM Medical Record [id number] Patient Account Number: [id number] Date of Birth/Sex: Treating RN: Apr 23, 1956 (64 y.o. Krystal Little Primary Care Corrinne Benegas: Krystal Little Other Clinician: Referring Brewer Hitchman: Treating Declin Rajan/Extender:Krystal Little, Krystal Little, Krystal Little in Treatment: 8 Vital Signs Height(in): 64 Capillary Blood 157 Glucose(mg/dl): Weight(lbs): 140 Pulse(bpm): 27 Body Mass Index(BMI):  24 Blood Pressure(mmHg): 147/80 Temperature(F): 98.1 Respiratory 18 Rate(breaths/min): Photos: [3:No Photos] [4:No Photos] [N/A:N/A] Wound Location: [3:Right, Medial Lower Leg] [4:Right, Distal, Medial Lower N/A Leg] Wounding Event: [3:Bump] [4:Other Lesion] [N/A:N/A] Primary Etiology: [3:Venous Leg Ulcer] [4:Venous Leg Ulcer] [N/A:N/A] Secondary Etiology: [3:N/A] [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] Comorbid History: [3:Arrhythmia, Deep Vein Thrombosis, Type II Diabetes] [4:Arrhythmia, Deep Vein Thrombosis, Type II Diabetes] [N/A:N/A] Date Acquired: [3:05/18/2019] [4:10/08/2019] [N/A:N/A] Little of Treatment: [3:8] [4:2] [N/A:N/A] Wound Status: [3:Open] [4:Healed -  Epithelialized] [N/A:N/A] Clustered Wound: [3:Yes] [4:No] [N/A:N/A] Measurements L x W x D 0x0x0 [4:0x0x0] [N/A:N/A] (cm) Area (cm) : [3:0] [4:0] [N/A:N/A] Volume (cm) : [3:0] [4:0] [N/A:N/A] % Reduction in Area: [3:100.00%] [4:100.00%] [N/A:N/A] % Reduction in Volume: 100.00% [4:100.00%] [N/A:N/A] Classification: [3:Full Thickness Without Exposed Support Structures Exposed Support Structures] [4:Full Thickness Without] [N/A:N/A] Exudate Amount: [3:None Present] [4:Small] [N/A:N/A] Exudate Type: [3:N/A] [4:Serosanguineous] [N/A:N/A] Exudate Color: [3:N/A] [4:red, brown] [N/A:N/A] Wound Margin: [3:Well defined, not attached Well defined, not attached N/A] Granulation Amount: [3:None Present (0%)] [4:None Present (0%)] [N/A:N/A] Necrotic Amount: [3:None Present (0%)] [4:None Present (0%)] [N/A:N/A] Exposed Structures: [3:Fascia: No Fat Layer (Subcutaneous Tissue) Exposed: No Tendon: No Muscle: No Joint: No Bone: No Large (67-100%)] [4:Fascia: No Fat Layer (Subcutaneous Tissue) Exposed: No Tendon: No Muscle: No Joint: No Bone: No Limited to Skin Breakdown Large  (67-100%)] [N/A:N/A N/A] Treatment Notes Electronic Signature(s) Signed: 10/28/2019 5:51:00 PM By: Linton Ham MD Signed: 10/28/2019 6:03:05 PM  By: Deon Pilling Entered By: Linton Ham on 10/28/2019 09:11:35 -------------------------------------------------------------------------------- [facility] Details Patient Name: Date of Service: Krystal Little 10/28/2019 8:15 AM Medical Record [id number] Patient Account Number: [id number] Date of Birth/Sex: Treating RN: July 13, 1956 (64 y.o. Krystal Little Primary Care Susano Cleckler: Krystal Little Other Clinician: Referring Laquisha Northcraft: Treating Claressa Hughley/Extender:Krystal Little, Krystal Little, Krystal Little in Treatment: 8 Active Inactive Electronic Signature(s) Signed: 10/28/2019 6:03:05 PM By: Deon Pilling Entered By: Deon Pilling on 10/28/2019 09:14:19 -------------------------------------------------------------------------------- Pain Assessment Details Patient Name: Date of Service: DONAE, KUEKER 10/28/2019 8:15 AM Medical Record [id number] Patient Account Number: [id number] Date of Birth/Sex: Treating RN: 27-Aug-1955 (64 y.o. Krystal Little Primary Care Raylyn Speckman: Krystal Little Other Clinician: Referring Jaris Kohles: Treating Precilla Purnell/Extender:Krystal Little, Krystal Little, Krystal Little in Treatment: 8 Active Problems Location of Pain Severity and Description of Pain Patient Has Paino No Patient Has Paino No Site Locations Pain Management and Medication Current Pain Management: Electronic Signature(s) Signed: 10/28/2019 6:03:05 PM By: Deon Pilling Signed: 10/29/2019 9:19:04 AM By: Sandre Kitty Entered By: Sandre Kitty on 10/28/2019 08:50:03 -------------------------------------------------------------------------------- Patient/Caregiver Education Details Patient Name: Date of Service: Krystal Little 4/13/2021andnbsp8:15 AM Medical Record [id number] Patient Account Number: [id number] Date of Birth/Gender: Treating RN: [birth year] (64 y.o. Krystal Little Primary Care Physician: Krystal Little Other  Clinician: Referring Physician: Treating Physician/Extender:Krystal Little, Krystal Little, Krystal Little in Treatment: 8 Education Assessment Education Provided To: Patient Education Topics Provided Wound/Skin Impairment: Handouts: Skin Care Do's and Dont's Methods: Explain/Verbal Responses: Reinforcements needed Electronic Signature(s) Signed: 10/28/2019 6:03:05 PM By: Deon Pilling Entered By: Deon Pilling on 10/28/2019 08:40:51 -------------------------------------------------------------------------------- Wound Assessment Details Patient Name: Date of Service: Krystal Little, Krystal Little 10/28/2019 8:15 AM Medical Record [id number] Patient Account Number: [id number] Date of Birth/Sex: Treating RN: 06/26/1956 (64 y.o. Elam Dutch Primary Care Nare Gaspari: Krystal Little Other Clinician: Referring Lynae Pederson: Treating Nethan Caudillo/Extender:Krystal Little, Krystal Little, Krystal Little in Treatment: 8 Wound Status Wound Number: 3 Primary Venous Leg Ulcer Etiology: Wound Location: Right, Medial Lower Leg Wound Open Wounding Event: Bump Status: Date Acquired: 05/18/2019 Comorbid Arrhythmia, Deep Vein Thrombosis, Type Little Of Treatment: 8 History: II Diabetes Clustered Wound: Yes Wound Measurements Length: (cm) 0 % Reduct Width: (cm) 0 % Reduct Depth: (cm) 0 Epitheli Area: (cm) 0 Tunneli Volume: (cm) 0 Undermi Wound Description Full Thickness Without Exposed Support Classification: Structures Wound Well defined, not attached Margin: Exudate None Present Amount: Wound Bed Granulation Amount: None Present (0%) Necrotic Amount: None Present (0%) Foul Odor After Cleansing: No Slough/Fibrino No Exposed Structure Fascia Exposed: No Fat Layer (Subcutaneous Tissue) Exposed: No  Tendon Exposed: No Muscle Exposed: No Joint Exposed: No Bone Exposed: No ion in Area: 100% ion in Volume: 100% alization: Large (67-100%) ng: No ning: No Electronic Signature(s) Signed: 10/28/2019  6:08:25 PM By: Baruch Gouty RN, BSN Entered By: Baruch Gouty on 10/28/2019 08:56:50 -------------------------------------------------------------------------------- Wound Assessment Details Patient Name: Date of Service: Krystal Little 10/28/2019 8:15 AM Medical Record [id number] Patient Account Number: [id number] Date of Birth/Sex: Treating RN: May 29, 1956 (64 y.o. Krystal Little, Meta.Reding Primary Care Gaius Ishaq: Krystal Little Other Clinician: Referring Bralin Garry: Treating Trust Leh/Extender:Krystal Little, Krystal Little, Krystal Little in Treatment: 8 Wound Status Wound Number: 4 Primary Venous Leg Ulcer Etiology: Wound Location: Right, Distal, Medial Lower Leg Secondary Diabetic Wound/Ulcer of the Lower Wounding Event: Other Lesion Etiology: Extremity Date Acquired: 10/08/2019 Wound Status: Healed - Epithelialized Little Of Treatment: 2 Comorbid Arrhythmia, Deep Vein Thrombosis, Clustered Wound: No History: Type II Diabetes Wound Measurements Length: (cm) 0 % Reduc Width: (cm) 0 % Reduc Depth: (cm) 0 Epithel Area: (cm) 0 Tunnel Volume: (cm) 0 Underm Wound Description Classification: Full Thickness Without Exposed Support [place] Wound Well defined, not attached Margin: Exudate Small Amount: Exudate Serosanguineous Type: Exudate red, brown Color: Wound Bed Granulation Amount: None Present (0%) Necrotic Amount: None Present (0%) Fascia Fat Lay Tendon Muscle Joint E Bone Ex Limited dor After Cleansing: No /Fibrino No Exposed Structure Exposed: No er (Subcutaneous Tissue) Exposed: No Exposed: No Exposed: No xposed: No posed: No to Skin Breakdown tion in Area: 100% tion in Volume: 100% ialization: Large (67-100%) ing: No ining: No Electronic Signature(s) Signed: 10/28/2019 6:03:05 PM By: Deon Pilling Entered By: Deon Pilling on 10/28/2019  09:08:07 -------------------------------------------------------------------------------- Vitals Details Patient Name: Date of Service: Krystal Little 10/28/2019 8:15 AM Medical Record [id number] Patient Account Number: [id number] Date of Birth/Sex: Treating RN: September 16, 1955 (64 y.o. Krystal Little, Meta.Reding Primary Care Lyndall Bellot: Krystal Little Other Clinician: Referring Valda Christenson: Treating Uno Esau/Extender:Krystal Little, Krystal Little, Krystal Little in Treatment: 8 Vital Signs Time Taken: 08:48 Temperature (F): 98.1 Height (in): 64 Pulse (bpm): 73 Weight (lbs): 140 Respiratory Rate (breaths/min): 18 Body Mass Index (BMI): 24 Blood Pressure (mmHg): 147/80 Capillary Blood Glucose (mg/dl): 157 Reference Range: 80 - 120 mg / dl Electronic Signature(s) Signed: 10/29/2019 9:19:04 AM By: Sandre Kitty Entered By: Sandre Kitty on 10/28/2019 08:49:58

## 2019-10-29 NOTE — Progress Notes (Signed)
Krystal Little, Krystal Little ([id number]) Visit Report for 10/08/2019 Arrival Information Details Patient Name: Date of Service: Krystal Little, Krystal Little 10/08/2019 7:30 AM Medical Record [id number] Patient Account Number: [id number] Date of Birth/Sex: Treating RN: 1955-12-09 (64 y.o. Krystal Little Primary Care Dakotah Orrego: Stark Klein Other Clinician: Referring Haward Pope: Treating Keyron Pokorski/Extender:Stone III, Lelon Huh, MAKIERA Weeks in Treatment: 5 Visit Information History Since Last Visit Added or deleted any medications: No Patient Arrived: Ambulatory Any new allergies or adverse reactions: No Arrival Time: 07:39 Had a fall or experienced change in No Accompanied By: self activities of daily living that may affect Transfer Assistance: None risk of falls: Patient Identification Verified: Yes Signs or symptoms of abuse/neglect since last No Secondary Verification Process Yes visito Completed: Hospitalized since last visit: No Patient Requires Transmission- No Implantable device outside of the clinic excluding No Based Precautions: cellular tissue based products placed in the center Patient Has Alerts: Yes since last visit: Patient Alerts: Patient on Blood Has Dressing in Place as Prescribed: Yes Thinner Pain Present Now: No Electronic Signature(s) Signed: 10/29/2019 9:21:08 AM By: Sandre Kitty Entered By: Sandre Kitty on 10/08/2019 07:39:40 -------------------------------------------------------------------------------- Compression Therapy Details Patient Name: Date of Service: Krystal Little 10/08/2019 7:30 AM Medical Record [id number] Patient Account Number: [id number] Date of Birth/Sex: Treating RN: 1955-12-13 (64 y.o. Krystal Little Primary Care Lawson Mahone: Stark Klein Other Clinician: Referring Bryler Dibble: Treating Jazmon Kos/Extender:Stone III, Lelon Huh, MAKIERA Weeks in Treatment: 5 Compression Therapy Performed for Wound Wound #3  Right,Medial Lower Leg Assessment: Performed By: Clinician Carlene Coria, RN Compression Type: Three Layer Post Procedure Diagnosis Same as Pre-procedure Electronic Signature(s) Signed: 10/08/2019 5:36:50 PM By: Baruch Gouty RN, BSN Entered By: Baruch Gouty on 10/08/2019 08:22:59 -------------------------------------------------------------------------------- Encounter Discharge Information Details Patient Name: Date of Service: Krystal Little 10/08/2019 7:30 AM Medical Record [id number] Patient Account Number: [id number] Date of Birth/Sex: Treating RN: [birth year] (64 y.o. Krystal Little Primary Care Izic Stfort: Stark Klein Other Clinician: Referring Chaka Boyson: Treating Jacier Gladu/Extender:Stone III, Lelon Huh, MAKIERA Weeks in Treatment: 5 Encounter Discharge Information Items Discharge Condition: Stable Ambulatory Status: Ambulatory Discharge Destination: Home Transportation: Private Auto Accompanied By: self Schedule Follow-up Appointment: Yes Clinical Summary of Care: Patient Declined Electronic Signature(s) Signed: 10/08/2019 5:15:50 PM By: Carlene Coria RN Entered By: Carlene Coria on 10/08/2019 08:37:03 -------------------------------------------------------------------------------- Lower Extremity Assessment Details Patient Name: Date of Service: Krystal Little 10/08/2019 7:30 AM Medical Record [id number] Patient Account Number: [id number] Date of Birth/Sex: Treating RN: 12-May-1956 (63 y.o. Krystal Little Primary Care Avner Stroder: Stark Klein Other Clinician: Referring Nyellie Yetter: Treating Sheniece Ruggles/Extender:Stone III, Lelon Huh, MAKIERA Weeks in Treatment: 5 Edema Assessment Assessed: [Left: No] [Right: No] Edema: [Left: N] [Right: o] Calf Left: Right: Point of Measurement: 26 cm From Medial Instep cm 31.4 cm Ankle Left: Right: Point of Measurement: 11 cm From Medial Instep cm 21 cm Vascular Assessment Pulses: Dorsalis  Pedis Palpable: [Right:Yes] Electronic Signature(s) Signed: 10/08/2019 5:36:50 PM By: Baruch Gouty RN, BSN Entered By: Baruch Gouty on 10/08/2019 08:00:38 -------------------------------------------------------------------------------- Multi-Disciplinary Care Plan Details Patient Name: Date of Service: Krystal Little 10/08/2019 7:30 AM Medical Record [id number] Patient Account Number: [id number] Date of Birth/Sex: Treating RN: 07/08/1956 (64 y.o. Krystal Little Primary Care Nessa Ramaker: Stark Klein Other Clinician: Referring Keisuke Hollabaugh: Treating Georgie Eduardo/Extender:Stone III, Lelon Huh, MAKIERA Weeks in Treatment: 5 Active Inactive Wound/Skin Impairment Nursing Diagnoses: Knowledge deficit related to ulceration/compromised skin integrity Goals: Patient/caregiver will verbalize understanding of skin care regimen Date Initiated: 09/02/2019 Target Resolution Date: 10/31/2019 Goal Status: Active Ulcer/skin breakdown will have a volume  reduction of 30% by week 4 Date Initiated: 09/02/2019 Date Inactivated: 09/30/2019 Target Resolution Date: 09/30/2019 Unmet Reason: comorbities, Goal Status: Unmet pt declines biopsy, debridment Ulcer/skin breakdown will have a volume reduction of 50% by week 8 Date Initiated: 09/30/2019 Target Resolution Date: 10/31/2019 Goal Status: Active Interventions: Assess patient/caregiver ability to obtain necessary supplies Assess patient/caregiver ability to perform ulcer/skin care regimen upon admission and as needed Assess ulceration(s) every visit Notes: Electronic Signature(s) Signed: 10/08/2019 5:36:50 PM By: Baruch Gouty RN, BSN Entered By: Baruch Gouty on 10/08/2019 08:01:10 -------------------------------------------------------------------------------- Pain Assessment Details Patient Name: Date of Service: Krystal Little 10/08/2019 7:30 AM Medical Record [id number] Patient Account Number: [id number] Date of  Birth/Sex: Treating RN: 1956-02-23 (64 y.o. Krystal Little Primary Care Marcial Pless: Stark Klein Other Clinician: Referring Arben Packman: Treating Masiah Lewing/Extender:Stone III, Lelon Huh, MAKIERA Weeks in Treatment: 5 Active Problems Location of Pain Severity and Description of Pain Patient Has Paino No Site Locations Pain Management and Medication Current Pain Management: Electronic Signature(s) Signed: 10/08/2019 5:15:50 PM By: Carlene Coria RN Signed: 10/29/2019 9:21:08 AM By: Sandre Kitty Entered By: Sandre Kitty on 10/08/2019 07:39:47 -------------------------------------------------------------------------------- Patient/Caregiver Education Details Patient Name: Krystal Little Date of Service: 3/24/2021andnbsp7:30 AM Medical Record [id number] Patient Account Number: [id number] Date of Birth/Gender: [birth year] (64 y.o. F) Treating RN: Baruch Gouty Primary Care Physician: Stark Klein Other Clinician: Referring Physician: Stark Klein Treating Physician/Extender:Stone III, Vance Gather in Treatment: 5 Education Assessment Education Provided To: Patient Education Topics Provided Venous: Methods: Explain/Verbal Responses: Reinforcements needed, State content correctly Electronic Signature(s) Signed: 10/08/2019 5:36:50 PM By: Baruch Gouty RN, BSN Entered By: Baruch Gouty on 10/08/2019 08:01:24 -------------------------------------------------------------------------------- Wound Assessment Details Patient Name: Date of Service: Krystal Little 10/08/2019 7:30 AM Medical Record [id number] Patient Account Number: [id number] Date of Birth/Sex: Treating RN: May 23, 1956 (64 y.o. Krystal Little Primary Care Dalayna Lauter: Stark Klein Other Clinician: Referring Denzell Colasanti: Treating Tashanda Fuhrer/Extender:Stone III, Lelon Huh, MAKIERA Weeks in Treatment: 5 Wound Status Wound Number: 3 Primary Etiology: Venous Leg Ulcer Wound Location: Right,  Medial Lower Leg Wound Status: Open Wounding Event: Bump Date Acquired: 05/18/2019 Weeks Of Treatment: 5 Clustered Wound: Yes Photos Photo Uploaded By: Mikeal Hawthorne on 10/09/2019 14:11:17 Wound Measurements Length: (cm) 1 % Reduction Width: (cm) 1 % Reduc Depth: (cm) 0.1 Area: (cm) 0.785 Volume: (cm) 0.079 Wound Description Full Thickness Without Exposed Support Classification: Structures in Area: 90.5% tion in Volume: 90.4% Electronic Signature(s) Signed: 10/08/2019 5:15:50 PM By: Carlene Coria RN Signed: 10/29/2019 9:21:08 AM By: Sandre Kitty Entered By: Sandre Kitty on 10/08/2019 07:47:52 -------------------------------------------------------------------------------- Wound Assessment Details Patient Name: Date of Service: Krystal Little 10/08/2019 7:30 AM Medical Record [id number] Patient Account Number: [id number] Date of Birth/Sex: Treating RN: [birth year] (64 y.o. Krystal Little Primary Care Teran Knittle: Stark Klein Other Clinician: Referring Chantell Kunkler: Treating Archibald Marchetta/Extender:Stone III, Lelon Huh, MAKIERA Weeks in Treatment: 5 Wound Status Wound Number: 4 Primary Etiology: To be determined Wound Location: Left, Distal Lower Leg Wound Status: Open Wounding Event: Other Lesion Date Acquired: 10/08/2019 Weeks Of Treatment: 0 Clustered Wound: No Photos Photo Uploaded By: Mikeal Hawthorne on 10/09/2019 14:11:18 Wound Measurements Length: (cm) 0.6 % Reduc Width: (cm) 0.4 % Reduc Depth: (cm) 0.1 Area: (cm) 0.188 Volume: (cm) 0.019 tion in Area: tion in Volume: Electronic Signature(s) Signed: 10/08/2019 5:15:50 PM By: Carlene Coria RN Signed: 10/29/2019 9:21:08 AM By: Sandre Kitty Entered By: Sandre Kitty on 10/08/2019 07:47:52 -------------------------------------------------------------------------------- Vitals Details Patient Name: Date of Service: Krystal Little 10/08/2019 7:30 AM Medical Record [id number] Patient  Account Number:  [id number] Date of Birth/Sex: Treating RN: [birth year] (64 y.o. Krystal Little Primary Care Nolah Krenzer: Stark Klein Other Clinician: Referring Wessie Shanks: Treating Younis Mathey/Extender:Stone III, Lelon Huh, MAKIERA Weeks in Treatment: 5 Vital Signs Time Taken: 07:41 Temperature (F): 98.3 Height (in): 64 Pulse (bpm): 76 Weight (lbs): 140 Respiratory Rate (breaths/min): 18 Body Mass Index (BMI): 24 Blood Pressure (mmHg): 154/85 Reference Range: 80 - 120 mg / dl Electronic Signature(s) Signed: 10/29/2019 9:21:08 AM By: Sandre Kitty Entered By: Sandre Kitty on 10/08/2019 07:41:15

## 2019-10-29 NOTE — Progress Notes (Signed)
Krystal Little ([id number]) Visit Report for 09/23/2019 Arrival Information Details Patient Name: Date of Service: Krystal Little, Krystal Little 09/23/2019 4:00 PM Medical Record [id number] Patient Account Number: [id number] Date of Birth/Sex: Treating RN: 1956-07-01 (64 y.o. [name] Primary Care Savian Mazon: Stark Klein Other Clinician: Referring Zuly Belkin: Treating Jiyaan Steinhauser/Extender:Robson, Kathee Delton, MAKIERA Weeks in Treatment: 3 Visit Information History Since Last Visit Added or deleted any medications: No Patient Arrived: Ambulatory Any new allergies or adverse reactions: No Arrival Time: 16:39 Had a fall or experienced change in No Accompanied By: self activities of daily living that may affect Transfer Assistance: Manual risk of falls: Patient Identification Verified: Yes Signs or symptoms of abuse/neglect since last No Secondary Verification Process Yes visito Completed: Hospitalized since last visit: No Patient Requires Transmission- No Implantable device outside of the clinic excluding No Based Precautions: cellular tissue based products placed in the center Patient Has Alerts: Yes since last visit: Patient Alerts: Patient on Blood Has Dressing in Place as Prescribed: Yes Thinner Pain Present Now: No Electronic Signature(s) Signed: 10/29/2019 9:21:08 AM By: Sandre Kitty Entered By: Sandre Kitty on 09/23/2019 16:39:44 -------------------------------------------------------------------------------- Compression Therapy Details Patient Name: Date of Service: Krystal Little 09/23/2019 4:00 PM Medical Record [id number] Patient Account Number: [id number] Date of Birth/Sex: Treating RN: 10-Sep-1955 (64 y.o. [name] Primary Care Itzamar Traynor: Stark Klein Other Clinician: Referring Yechezkel Fertig: Treating Osamah Schmader/Extender:Robson, Kathee Delton, MAKIERA Weeks in Treatment: 3 Compression Therapy Performed for Wound Wound #3 Right,Medial  Lower Leg Assessment: Performed By: Clinician Carlene Coria, RN Compression Type: Three Layer Post Procedure Diagnosis Same as Pre-procedure Electronic Signature(s) Signed: 09/24/2019 7:20:02 AM By: Carlene Coria RN Entered By: Carlene Coria on 09/23/2019 17:22:22 -------------------------------------------------------------------------------- Encounter Discharge Information Details Patient Name: Date of Service: Krystal Little 09/23/2019 4:00 PM Medical Record [id number] Patient Account Number: [id number] Date of Birth/Sex: Treating RN: Jul 16, 1956 (64 y.o. [name] Primary Care Valma Rotenberg: Stark Klein Other Clinician: Referring Candra Wegner: Treating Mariel Gaudin/Extender:Robson, Kathee Delton, MAKIERA Weeks in Treatment: 3 Encounter Discharge Information Items Discharge Condition: Stable Ambulatory Status: Ambulatory Discharge Destination: Home Transportation: Private Auto Accompanied By: self Schedule Follow-up Appointment: Yes Clinical Summary of Care: Patient Declined Electronic Signature(s) Signed: 09/23/2019 5:46:17 PM By: Kela Millin Entered By: Kela Millin on 09/23/2019 17:42:01 -------------------------------------------------------------------------------- Lower Extremity Assessment Details Patient Name: Date of Service: Krystal Little 09/23/2019 4:00 PM Medical Record [id number] Patient Account Number: [id number] Date of Birth/Sex: Treating RN: 1956-06-06 (64 y.o. Elam Dutch Primary Care Sonia Stickels: Stark Klein Other Clinician: Referring Sylar Voong: Treating Gaspar Fowle/Extender:Robson, Kathee Delton, MAKIERA Weeks in Treatment: 3 Edema Assessment Assessed: [Left: No] [Right: No] Edema: [Left: N] [Right: o] Calf Left: Right: Point of Measurement: 26 cm From Medial Instep cm 30.5 cm Ankle Left: Right: Point of Measurement: 11 cm From Medial Instep cm 21 cm Vascular Assessment Pulses: Dorsalis Pedis Palpable:  [Right:Yes] Electronic Signature(s) Signed: 09/23/2019 6:17:27 PM By: Baruch Gouty RN, BSN Entered By: Baruch Gouty on 09/23/2019 17:01:24 -------------------------------------------------------------------------------- Multi Wound Chart Details Patient Name: Date of Service: Krystal Little 09/23/2019 4:00 PM Medical Record [id number] Patient Account Number: [id number] Date of Birth/Sex: Treating RN: [birth year] (64 y.o. [name] Primary Care Carmellia Kreisler: Stark Klein Other Clinician: Referring Carthel Castille: Treating Raeya Merritts/Extender:Robson, Kathee Delton, MAKIERA Weeks in Treatment: 3 Vital Signs Height(in): 64 Pulse(bpm): 86 Weight(lbs): 140 Blood Pressure(mmHg): 167/79 Body Mass Index(BMI): 24 Temperature(F): 98.5 Respiratory 18 Rate(breaths/min): Photos: [3:No Photos] [N/A:N/A] Wound Location: [3:Right Lower Leg - Medial] [N/A:N/A] Wounding Event: [3:Bump] [N/A:N/A] Primary Etiology: [3:Venous Leg Ulcer] [N/A:N/A] Comorbid History: [3:Arrhythmia,  Deep Vein Thrombosis, Type II Diabetes] [N/A:N/A] Date Acquired: [3:05/18/2019] [N/A:N/A] Weeks of Treatment: [3:3] [N/A:N/A] Wound Status: [3:Open] [N/A:N/A] Clustered Wound: [3:Yes] [N/A:N/A] Clustered Quantity: [3:3] [N/A:N/A] Measurements L x W x D 4.2x2x0.3 [N/A:N/A] (cm) Area (cm) : [8:5.885] [N/A:N/A] Volume (cm) : [3:1.979] [N/A:N/A] % Reduction in Area: [3:20.00%] [N/A:N/A] % Reduction in Volume: [3:-139.90%] [N/A:N/A] Classification: [3:Full Thickness Without Exposed Support Structures] [N/A:N/A] Exudate Amount: [3:Medium] [N/A:N/A] Exudate Type: [3:Serosanguineous] [N/A:N/A] Exudate Color: [3:red, brown] [N/A:N/A] Wound Margin: [3:Distinct, outline attached N/A] Granulation Amount: [3:Large (67-100%)] [N/A:N/A] Granulation Quality: [3:Red, Pink] [N/A:N/A] Necrotic Amount: [3:Small (1-33%)] [N/A:N/A] Exposed Structures: [3:Fat Layer (Subcutaneous N/A Tissue) Exposed: Yes Fascia: No Tendon:  No Muscle: No Joint: No Bone: No] Epithelialization: [3:Medium (34-66%) Compression Therapy] [N/A:N/A N/A] Treatment Notes Electronic Signature(s) Signed: 09/23/2019 5:48:31 PM By: Linton Ham MD Signed: 09/24/2019 7:20:02 AM By: Carlene Coria RN Entered By: Linton Ham on 09/23/2019 17:29:40 -------------------------------------------------------------------------------- Multi-Disciplinary Care Plan Details Patient Name: Date of Service: Krystal Little 09/23/2019 4:00 PM Medical Record [id number] Patient Account Number: [id number] Date of Birth/Sex: Treating RN: [birth year] (64 y.o. [name] Primary Care Breklyn Fabrizio: Stark Klein Other Clinician: Referring Taylen Osorto: Treating Shajuan Musso/Extender:Robson, Kathee Delton, MAKIERA Weeks in Treatment: 3 Active Inactive Wound/Skin Impairment Nursing Diagnoses: Knowledge deficit related to ulceration/compromised skin integrity Goals: Patient/caregiver will verbalize understanding of skin care regimen Date Initiated: 09/02/2019 Target Resolution Date: 09/30/2019 Goal Status: Active Ulcer/skin breakdown will have a volume reduction of 30% by week 4 Date Initiated: 09/02/2019 Target Resolution Date: 09/30/2019 Goal Status: Active Interventions: Assess patient/caregiver ability to obtain necessary supplies Assess patient/caregiver ability to perform ulcer/skin care regimen upon admission and as needed Assess ulceration(s) every visit Notes: Electronic Signature(s) Signed: 09/24/2019 7:20:02 AM By: Carlene Coria RN Entered By: Carlene Coria on 09/23/2019 17:19:34 -------------------------------------------------------------------------------- Pain Assessment Details Patient Name: Date of Service: KARMEN, ALTAMIRANO 09/23/2019 4:00 PM Medical Record [id number] Patient Account Number: [id number] Date of Birth/Sex: Treating RN: 1955/08/23 (64 y.o. [name] Primary Care Patrick Salemi: Stark Klein Other  Clinician: Referring Arvid Marengo: Treating Wynton Hufstetler/Extender:Robson, Kathee Delton, MAKIERA Weeks in Treatment: 3 Active Problems Location of Pain Severity and Description of Pain Patient Has Paino No Site Locations Pain Management and Medication Current Pain Management: Electronic Signature(s) Signed: 09/24/2019 7:20:02 AM By: Carlene Coria RN Signed: 10/29/2019 9:21:08 AM By: Sandre Kitty Entered By: Sandre Kitty on 09/23/2019 16:41:28 -------------------------------------------------------------------------------- Patient/Caregiver Education Details Patient Name: Date of Service: Paget, Erendira B. 3/9/2021andnbsp4:00 PM Medical Record [id number] Patient Account Number: [id number] Date of Birth/Gender: Treating RN: 03-24-1956 (64 y.o. [name] Primary Care Physician: Stark Klein Other Clinician: Referring Physician: Treating Physician/Extender:Robson, Kathee Delton, Danise Mina in Treatment: 3 Education Assessment Education Provided To: Patient Education Topics Provided Wound/Skin Impairment: Methods: Explain/Verbal Responses: State content correctly Electronic Signature(s) Signed: 09/24/2019 7:20:02 AM By: Carlene Coria RN Entered By: Carlene Coria on 09/23/2019 17:19:47 -------------------------------------------------------------------------------- Wound Assessment Details Patient Name: Date of Service: MIRAI, GREENWOOD 09/23/2019 4:00 PM Medical Record [id number] Patient Account Number: [id number] Date of Birth/Sex: Treating RN: 1955/08/17 (64 y.o. [name] Primary Care Milton Streicher: Stark Klein Other Clinician: Referring Kahlel Peake: Treating Schelly Chuba/Extender:Robson, Kathee Delton, MAKIERA Weeks in Treatment: 3 Wound Status Wound Number: 3 Primary Venous Leg Ulcer Etiology: Wound Location: Right Lower Leg - Medial Wound Open Wounding Event: Bump Status: Date Acquired: 05/18/2019 Comorbid Arrhythmia, Deep Vein  Thrombosis, Type Weeks Of Treatment: 3 History: II Diabetes Clustered Wound: Yes Photos Wound Measurements Length: (cm) 4.2 % Reduct Width: (cm) 2 % Reduct Depth: (cm) 0.3 Epitheli Clustered Quantity:  3 Tunnelin Area: (cm) 6.597 Undermi Volume: (cm) 1.979 Wound Description Full Thickness Without Exposed Support Foul Od Classification: Structures Slough/ Wound Distinct, outline attached Margin: Exudate Medium Amount: Exudate Serosanguineous Type: Exudate red, brown Color: Wound Bed Granulation Amount: Large (67-100%) Granulation Quality: Red, Pink Fascia E Necrotic Amount: Small (1-33%) Fat Laye Necrotic Quality: Adherent Slough Tendon E Muscle E Joint Ex Bone Exp or After Cleansing: No Fibrino Yes Exposed Structure xposed: No r (Subcutaneous Tissue) Exposed: Yes xposed: No xposed: No posed: No osed: No ion in Area: 20% ion in Volume: -139.9% alization: Medium (34-66%) g: No ning: No Electronic Signature(s) Signed: 09/26/2019 4:47:30 PM By: Mikeal Hawthorne EMT/HBOT Signed: 09/26/2019 5:30:50 PM By: Carlene Coria RN Previous Signature: 09/23/2019 6:17:27 PM Version By: Baruch Gouty RN, BSN Previous Signature: 09/24/2019 7:20:02 AM Version By: Carlene Coria RN Entered By: Mikeal Hawthorne on 09/26/2019 13:51:36 -------------------------------------------------------------------------------- Vitals Details Patient Name: Date of Service: Krystal Little 09/23/2019 4:00 PM Medical Record [id number] Patient Account Number: [id number] Date of Birth/Sex: Treating RN: 1955-12-21 (64 y.o. [name] Primary Care Lasasha Brophy: Stark Klein Other Clinician: Referring Karilynn Carranza: Treating Mauro Arps/Extender:Robson, Kathee Delton, MAKIERA Weeks in Treatment: 3 Vital Signs Time Taken: 16:41 Temperature (F): 98.5 Height (in): 64 Pulse (bpm): 86 Weight (lbs): 140 Respiratory Rate (breaths/min): 18 Body Mass Index (BMI): 24 Blood Pressure (mmHg):  167/79 Reference Range: 80 - 120 mg / dl Electronic Signature(s) Signed: 10/29/2019 9:21:08 AM By: Sandre Kitty Entered By: Sandre Kitty on 09/23/2019 16:41:23

## 2019-10-30 ENCOUNTER — Ambulatory Visit: Payer: Medicare Other | Admitting: Cardiology

## 2019-10-30 ENCOUNTER — Encounter: Payer: Self-pay | Admitting: Cardiology

## 2019-10-30 ENCOUNTER — Other Ambulatory Visit: Payer: Self-pay

## 2019-10-30 VITALS — BP 126/62 | HR 70 | Ht 64.0 in | Wt 140.2 lb

## 2019-10-30 DIAGNOSIS — I4821 Permanent atrial fibrillation: Secondary | ICD-10-CM | POA: Diagnosis not present

## 2019-10-30 LAB — CUP PACEART INCLINIC DEVICE CHECK
Battery Impedance: 1561 Ohm
Battery Remaining Longevity: 49 mo
Battery Voltage: 2.77 V
Brady Statistic RV Percent Paced: 54 %
Date Time Interrogation Session: 20210415151700
Implantable Lead Implant Date: 20100513
Implantable Lead Implant Date: 20100513
Implantable Lead Location: 753859
Implantable Lead Location: 753860
Implantable Lead Model: 4092
Implantable Lead Model: 4592
Implantable Pulse Generator Implant Date: 20100513
Lead Channel Impedance Value: 540 Ohm
Lead Channel Impedance Value: 67 Ohm
Lead Channel Pacing Threshold Amplitude: 1.25 V
Lead Channel Pacing Threshold Pulse Width: 0.4 ms
Lead Channel Sensing Intrinsic Amplitude: 22.4 mV
Lead Channel Setting Pacing Amplitude: 2.75 V
Lead Channel Setting Pacing Pulse Width: 0.4 ms
Lead Channel Setting Sensing Sensitivity: 5.6 mV

## 2019-10-30 NOTE — Progress Notes (Signed)
Electrophysiology Office Note   Date:  10/30/2019   ID:  Krystal Little, DOB May 28, 1956, MRN [id number]  PCP:  Stark Klein, MD  Cardiologist:  Glenetta Hew Primary Electrophysiologist:  Lillyan Hitson Meredith Leeds, MD    No chief complaint on file.    History of Present Illness: Krystal Little is a 64 y.o. female who presents today for electrophysiology evaluation.      She has a history of rheumatic heart disease the resulted in marked moderate to severe mitral regurgitation or prolapse. In December 2000 she presented with syncope and sick sinus syndrome. She underwent mitral valve repair with a ring annuloplasty and transposition of chordae followed by pacemaker placement. She has chronic atrial fibrillation as well as atrial flutter with an attempted ablation of complex atrial flutter awake forest. She has tried multiple antiarrhythmics and failed Sotalol and  Dofetilide.   Today, denies symptoms of palpitations, chest pain, shortness of breath, orthopnea, PND, lower extremity edema, claudication, dizziness, presyncope, syncope, bleeding, or neurologic sequela. The patient is tolerating medications without difficulties.  Overall she is doing well.  She has no chest pain or shortness of breath.  She is able to all of her daily activities without restriction.  She remains in atrial fibrillation.  Her device is functioning appropriately.   Past Medical History:  Diagnosis Date  . Chronic atrial fibrillation ([place]) 1990s   s/p DCCV then attempted ablation of complex A Flutter (@ [facility] - Dr. Deno Etienne), failed antiarrhythmics --> INR folllwed @ [facility] ->> now status post pacemaker placement with underlying A. fib.  . Diabetes mellitus type 2, uncontrolled, without complications    On Oral Medications ([place] [facility])  . Essential hypertension   . History of cardiac catheterization 2001   R&LHC - normal Coronaries, no evidence of Restictive Cardiomyopathy or Constrictive Pericarditis (also @ [facility])  . Hx of sick sinus syndrome 07/1999   Wtih symptomatic bradycardia - syncope ([place])  . 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 CARDIAC CATHETERIZATION  08/03/1999   [place] - normal Coronaries; no sign of constriction or restrictive cardiomypathy  . MITRAL VALVE REPAIR  07/1999   Both Ant& Post leaflet repair - quadrangular resection&caudal transposition, #32 Sequin Annuloplasty ring  . PACEMAKER GENERATOR CHANGE  11/26/2008   Medtronic Adapta L(? if it has been checked since 02/2013)  . PACEMAKER INSERTION  07/1999   for SSS in setting of Chronic Afib; Medtronic Kappa [id number], [id number] H.  . TRANSTHORACIC ECHOCARDIOGRAM  05/2018   Normal LV size and thickness.  EF estimated 50%.  Inferobasal HK and flattened septum c/w with elevated PA pressures. Mild functional mitral stenosis at rest (postoperative).  Moderate left atrial dilation and mild right atrial dilation.  Mean PA pressures estimated 52 mmHg.     Current Outpatient Medications  Medication Sig Dispense Refill  . atenolol (TENORMIN) 50 MG tablet TAKE 1 TABLET(50 MG) BY MOUTH DAILY 90 tablet 0  . diltiazem (CARDIZEM CD) 180 MG 24 hr capsule TAKE 1 CAPSULE(180 MG) BY MOUTH DAILY 90 capsule 2  . lisinopril (ZESTRIL) 5  MG tablet TAKE 1 TABLET(5 MG) BY MOUTH DAILY 90 tablet 0  . metFORMIN (GLUCOPHAGE-XR) 500 MG 24 hr tablet Take 500 mg by mouth 2 (two) times daily.   2  . warfarin (COUMADIN) 5 MG tablet TAKE 1 TABLET BY MOUTH DAILY EXCEPT TAKE 1 AND 1/2 TABLET ON MONDAY AND FRIDAY 45 tablet 3   No current  facility-administered medications for this visit.    Allergies:   Patient has no known allergies.   Social History:  The patient  reports that she has never smoked. She has never used smokeless tobacco. She reports current alcohol use. She reports that she does not use drugs.   Family History:  The patient's family history includes Cancer in her brother; Diabetes in her sister; Hypertension in her mother; Lung cancer in her brother.   ROS:  Please see the history of present illness.   Otherwise, review of systems is positive for none.   All other systems are reviewed and negative.   PHYSICAL EXAM: VS:  BP 126/62   Pulse 70   Ht 5\' 4"  (1.626 m)   Wt 140 lb 3.2 oz (63.6 kg)   SpO2 97%   BMI 24.07 kg/m  , BMI Body mass index is 24.07 kg/m. GEN: Well nourished, well developed, in no acute distress  HEENT: normal  Neck: no JVD, carotid bruits, or masses Cardiac: RRR; no murmurs, rubs, or gallops,no edema  Respiratory:  clear to auscultation bilaterally, normal work of breathing GI: soft, nontender, nondistended, + BS MS: no deformity or atrophy  Skin: warm and dry, device site well healed Neuro:  Strength and sensation are intact Psych: euthymic mood, full affect  EKG:  EKG is not ordered today. Personal review of the ekg ordered 08/26/18 shows atrial fibrillation, ventricular paced  Personal review of the device interrogation today. Results in [place]: 08/18/2019: ALT 31; BUN 12; Creatinine, Ser 0.79; Hemoglobin 15.2; Platelets 168; Potassium 3.8; Sodium 140    Lipid Panel     Component Value Date/Time   CHOL 212 (H) 07/15/2018 0907   TRIG 121 07/15/2018 0907   HDL 50 07/15/2018 0907   CHOLHDL 4.2 07/15/2018 0907   CHOLHDL 3.4 05/18/2016 0919   VLDL 17 05/18/2016 0919   LDLCALC 138 (H) 07/15/2018 0907   LDLDIRECT 107 (H) 10/02/2012 1018     Wt Readings from Last 3 Encounters:  10/30/19 140 lb 3.2 oz (63.6 kg)  10/07/19 141 lb (64 kg)  08/27/19 143 lb  3.2 oz (65 kg)      Other studies Reviewed: Additional studies/ records that were reviewed today include: TTE 06/12/15  Review of the above records today demonstrates:  - Left ventricle: The cavity size was normal. There was mild concentric hypertrophy. Systolic function was normal. The estimated ejection fraction was in the range of 55% to 60%. Wall motion was normal; there were no regional wall motion abnormalities. Doppler parameters are consistent with a reversible restrictive pattern, indicative of decreased left ventricular diastolic compliance and/or increased left atrial pressure (grade 3 diastolic dysfunction). Doppler parameters are consistent with high ventricular filling pressure. - Aortic valve: Calcified annulus. Trileaflet; normal thickness leaflets. There was trivial regurgitation. - Mitral valve: Calcified annulus. Mobility of the posterior leaflet was restricted. The findings are consistent with mild stenosis. There was mild regurgitation. Mean gradient (D): 6 mm Hg. Valve area by pressure half-time: 1.96 cm^2. - Left atrium: The atrium was severely dilated. - Right ventricle: The cavity size was mildly  to moderately dilated. Wall thickness was normal. Systolic function was moderately reduced. - Right atrium: The atrium was mildly dilated. - Atrial septum: No defect or patent foramen ovale was identified. - Tricuspid valve: There was moderate regurgitation. - Inferior vena cava: The vessel was dilated. The respirophasic diameter changes were blunted (< 50%), consistent with elevated central venous pressure.  ASSESSMENT AND PLAN:  1.  Permanent atrial fibrillation: On Coumadin for MVR as well as atrial fibrillation.  CHA2DS2-VASc of 3.    2.  Sick sinus syndrome s/p pacemaker: Status post Medtronic dual-chamber pacemaker.  Device functioning appropriately.  No changes at this time.  3. Mitral valve replacement: Mild stenosis on  TTE.  Continue to monitor.  4. Hypertension: Well-controlled  Current medicines are reviewed at length with the patient today.   The patient does not have concerns regarding her medicines.  The following changes were made today: None  Labs/ tests ordered today include:  No orders of the defined types were placed in this encounter.    Disposition:   FU with Taray Normoyle 1 year  Signed, Kaisha Wachob Meredith Leeds, MD  10/30/2019 3:56 PM     [facility] [address] [place] [address] [phone] (office) [phone] (fax)

## 2019-10-31 ENCOUNTER — Inpatient Hospital Stay (HOSPITAL_COMMUNITY): Payer: Medicare Other | Admitting: Registered Nurse

## 2019-10-31 ENCOUNTER — Emergency Department (HOSPITAL_COMMUNITY): Payer: Medicare Other

## 2019-10-31 ENCOUNTER — Inpatient Hospital Stay (HOSPITAL_COMMUNITY): Payer: Medicare Other

## 2019-10-31 ENCOUNTER — Encounter (HOSPITAL_COMMUNITY)
Admission: EM | Disposition: A | Discharge status: Rehabilitation Facility | Payer: Self-pay | Source: Home / Self Care | Attending: Neurology

## 2019-10-31 ENCOUNTER — Inpatient Hospital Stay (HOSPITAL_COMMUNITY)
Admission: EM | Admit: 2019-10-31 | Discharge: 2019-11-20 | DRG: 023 | Disposition: A | Discharge status: Rehabilitation Facility | Payer: Medicare Other | Attending: Neurology | Admitting: Neurology

## 2019-10-31 ENCOUNTER — Encounter (HOSPITAL_COMMUNITY): Payer: Self-pay | Admitting: Neurology

## 2019-10-31 DIAGNOSIS — Z9289 Personal history of other medical treatment: Secondary | ICD-10-CM

## 2019-10-31 DIAGNOSIS — Z978 Presence of other specified devices: Secondary | ICD-10-CM

## 2019-10-31 DIAGNOSIS — R0602 Shortness of breath: Secondary | ICD-10-CM

## 2019-10-31 DIAGNOSIS — R7309 Other abnormal glucose: Secondary | ICD-10-CM | POA: Diagnosis not present

## 2019-10-31 DIAGNOSIS — E1165 Type 2 diabetes mellitus with hyperglycemia: Secondary | ICD-10-CM | POA: Diagnosis present

## 2019-10-31 DIAGNOSIS — J9600 Acute respiratory failure, unspecified whether with hypoxia or hypercapnia: Secondary | ICD-10-CM | POA: Diagnosis not present

## 2019-10-31 DIAGNOSIS — J9601 Acute respiratory failure with hypoxia: Secondary | ICD-10-CM | POA: Diagnosis not present

## 2019-10-31 DIAGNOSIS — E8770 Fluid overload, unspecified: Secondary | ICD-10-CM | POA: Diagnosis present

## 2019-10-31 DIAGNOSIS — I609 Nontraumatic subarachnoid hemorrhage, unspecified: Secondary | ICD-10-CM | POA: Diagnosis present

## 2019-10-31 DIAGNOSIS — G935 Compression of brain: Secondary | ICD-10-CM | POA: Diagnosis present

## 2019-10-31 DIAGNOSIS — I469 Cardiac arrest, cause unspecified: Secondary | ICD-10-CM | POA: Diagnosis not present

## 2019-10-31 DIAGNOSIS — Z794 Long term (current) use of insulin: Secondary | ICD-10-CM | POA: Diagnosis not present

## 2019-10-31 DIAGNOSIS — Z20822 Contact with and (suspected) exposure to covid-19: Secondary | ICD-10-CM | POA: Diagnosis present

## 2019-10-31 DIAGNOSIS — Z950 Presence of cardiac pacemaker: Secondary | ICD-10-CM | POA: Diagnosis not present

## 2019-10-31 DIAGNOSIS — I340 Nonrheumatic mitral (valve) insufficiency: Secondary | ICD-10-CM | POA: Diagnosis not present

## 2019-10-31 DIAGNOSIS — I63231 Cerebral infarction due to unspecified occlusion or stenosis of right carotid arteries: Secondary | ICD-10-CM | POA: Diagnosis not present

## 2019-10-31 DIAGNOSIS — J9811 Atelectasis: Secondary | ICD-10-CM | POA: Diagnosis not present

## 2019-10-31 DIAGNOSIS — K567 Ileus, unspecified: Secondary | ICD-10-CM | POA: Diagnosis not present

## 2019-10-31 DIAGNOSIS — E7800 Pure hypercholesterolemia, unspecified: Secondary | ICD-10-CM | POA: Diagnosis not present

## 2019-10-31 DIAGNOSIS — I482 Chronic atrial fibrillation, unspecified: Secondary | ICD-10-CM | POA: Diagnosis present

## 2019-10-31 DIAGNOSIS — Z992 Dependence on renal dialysis: Secondary | ICD-10-CM | POA: Diagnosis not present

## 2019-10-31 DIAGNOSIS — R531 Weakness: Secondary | ICD-10-CM | POA: Diagnosis not present

## 2019-10-31 DIAGNOSIS — G8194 Hemiplegia, unspecified affecting left nondominant side: Secondary | ICD-10-CM | POA: Diagnosis not present

## 2019-10-31 DIAGNOSIS — I63511 Cerebral infarction due to unspecified occlusion or stenosis of right middle cerebral artery: Secondary | ICD-10-CM | POA: Diagnosis not present

## 2019-10-31 DIAGNOSIS — I6521 Occlusion and stenosis of right carotid artery: Secondary | ICD-10-CM | POA: Diagnosis not present

## 2019-10-31 DIAGNOSIS — R1310 Dysphagia, unspecified: Secondary | ICD-10-CM | POA: Diagnosis present

## 2019-10-31 DIAGNOSIS — R059 Cough, unspecified: Secondary | ICD-10-CM

## 2019-10-31 DIAGNOSIS — Z8249 Family history of ischemic heart disease and other diseases of the circulatory system: Secondary | ICD-10-CM | POA: Diagnosis not present

## 2019-10-31 DIAGNOSIS — R414 Neurologic neglect syndrome: Secondary | ICD-10-CM | POA: Diagnosis not present

## 2019-10-31 DIAGNOSIS — I4891 Unspecified atrial fibrillation: Secondary | ICD-10-CM

## 2019-10-31 DIAGNOSIS — Z79899 Other long term (current) drug therapy: Secondary | ICD-10-CM | POA: Diagnosis not present

## 2019-10-31 DIAGNOSIS — R29810 Facial weakness: Secondary | ICD-10-CM | POA: Diagnosis present

## 2019-10-31 DIAGNOSIS — R29719 NIHSS score 19: Secondary | ICD-10-CM | POA: Diagnosis not present

## 2019-10-31 DIAGNOSIS — I959 Hypotension, unspecified: Secondary | ICD-10-CM | POA: Diagnosis not present

## 2019-10-31 DIAGNOSIS — J690 Pneumonitis due to inhalation of food and vomit: Secondary | ICD-10-CM | POA: Diagnosis not present

## 2019-10-31 DIAGNOSIS — R29714 NIHSS score 14: Secondary | ICD-10-CM | POA: Diagnosis present

## 2019-10-31 DIAGNOSIS — I161 Hypertensive emergency: Secondary | ICD-10-CM | POA: Diagnosis not present

## 2019-10-31 DIAGNOSIS — R29721 NIHSS score 21: Secondary | ICD-10-CM | POA: Diagnosis not present

## 2019-10-31 DIAGNOSIS — I69354 Hemiplegia and hemiparesis following cerebral infarction affecting left non-dominant side: Secondary | ICD-10-CM | POA: Diagnosis not present

## 2019-10-31 DIAGNOSIS — Z7984 Long term (current) use of oral hypoglycemic drugs: Secondary | ICD-10-CM

## 2019-10-31 DIAGNOSIS — R05 Cough: Secondary | ICD-10-CM

## 2019-10-31 DIAGNOSIS — Z952 Presence of prosthetic heart valve: Secondary | ICD-10-CM | POA: Diagnosis not present

## 2019-10-31 DIAGNOSIS — G936 Cerebral edema: Secondary | ICD-10-CM

## 2019-10-31 DIAGNOSIS — I517 Cardiomegaly: Secondary | ICD-10-CM | POA: Diagnosis not present

## 2019-10-31 DIAGNOSIS — G9349 Other encephalopathy: Secondary | ICD-10-CM | POA: Diagnosis not present

## 2019-10-31 DIAGNOSIS — I361 Nonrheumatic tricuspid (valve) insufficiency: Secondary | ICD-10-CM | POA: Diagnosis not present

## 2019-10-31 DIAGNOSIS — Z515 Encounter for palliative care: Secondary | ICD-10-CM | POA: Diagnosis not present

## 2019-10-31 DIAGNOSIS — I5189 Other ill-defined heart diseases: Secondary | ICD-10-CM

## 2019-10-31 DIAGNOSIS — Z9911 Dependence on respirator [ventilator] status: Secondary | ICD-10-CM | POA: Diagnosis not present

## 2019-10-31 DIAGNOSIS — R32 Unspecified urinary incontinence: Secondary | ICD-10-CM | POA: Diagnosis present

## 2019-10-31 DIAGNOSIS — Z743 Need for continuous supervision: Secondary | ICD-10-CM | POA: Diagnosis not present

## 2019-10-31 DIAGNOSIS — E1159 Type 2 diabetes mellitus with other circulatory complications: Secondary | ICD-10-CM | POA: Diagnosis not present

## 2019-10-31 DIAGNOSIS — E119 Type 2 diabetes mellitus without complications: Secondary | ICD-10-CM | POA: Diagnosis not present

## 2019-10-31 DIAGNOSIS — Z9889 Other specified postprocedural states: Secondary | ICD-10-CM | POA: Diagnosis not present

## 2019-10-31 DIAGNOSIS — J9611 Chronic respiratory failure with hypoxia: Secondary | ICD-10-CM | POA: Diagnosis not present

## 2019-10-31 DIAGNOSIS — K602 Anal fissure, unspecified: Secondary | ICD-10-CM | POA: Diagnosis not present

## 2019-10-31 DIAGNOSIS — Z833 Family history of diabetes mellitus: Secondary | ICD-10-CM | POA: Diagnosis not present

## 2019-10-31 DIAGNOSIS — I4821 Permanent atrial fibrillation: Secondary | ICD-10-CM | POA: Diagnosis present

## 2019-10-31 DIAGNOSIS — J9691 Respiratory failure, unspecified with hypoxia: Secondary | ICD-10-CM

## 2019-10-31 DIAGNOSIS — I071 Rheumatic tricuspid insufficiency: Secondary | ICD-10-CM | POA: Diagnosis present

## 2019-10-31 DIAGNOSIS — I63411 Cerebral infarction due to embolism of right middle cerebral artery: Secondary | ICD-10-CM | POA: Diagnosis not present

## 2019-10-31 DIAGNOSIS — K921 Melena: Secondary | ICD-10-CM | POA: Diagnosis not present

## 2019-10-31 DIAGNOSIS — R0989 Other specified symptoms and signs involving the circulatory and respiratory systems: Secondary | ICD-10-CM | POA: Diagnosis not present

## 2019-10-31 DIAGNOSIS — I63421 Cerebral infarction due to embolism of right anterior cerebral artery: Secondary | ICD-10-CM | POA: Diagnosis not present

## 2019-10-31 DIAGNOSIS — D72829 Elevated white blood cell count, unspecified: Secondary | ICD-10-CM | POA: Diagnosis not present

## 2019-10-31 DIAGNOSIS — I639 Cerebral infarction, unspecified: Secondary | ICD-10-CM

## 2019-10-31 DIAGNOSIS — J942 Hemothorax: Secondary | ICD-10-CM | POA: Diagnosis not present

## 2019-10-31 DIAGNOSIS — I69391 Dysphagia following cerebral infarction: Secondary | ICD-10-CM | POA: Diagnosis not present

## 2019-10-31 DIAGNOSIS — G932 Benign intracranial hypertension: Secondary | ICD-10-CM | POA: Diagnosis not present

## 2019-10-31 DIAGNOSIS — R482 Apraxia: Secondary | ICD-10-CM | POA: Diagnosis present

## 2019-10-31 DIAGNOSIS — D62 Acute posthemorrhagic anemia: Secondary | ICD-10-CM | POA: Diagnosis not present

## 2019-10-31 DIAGNOSIS — E1169 Type 2 diabetes mellitus with other specified complication: Secondary | ICD-10-CM | POA: Diagnosis present

## 2019-10-31 DIAGNOSIS — I63311 Cerebral infarction due to thrombosis of right middle cerebral artery: Secondary | ICD-10-CM | POA: Diagnosis not present

## 2019-10-31 DIAGNOSIS — R29720 NIHSS score 20: Secondary | ICD-10-CM | POA: Diagnosis not present

## 2019-10-31 DIAGNOSIS — Z452 Encounter for adjustment and management of vascular access device: Secondary | ICD-10-CM

## 2019-10-31 DIAGNOSIS — Z7189 Other specified counseling: Secondary | ICD-10-CM | POA: Diagnosis not present

## 2019-10-31 DIAGNOSIS — E876 Hypokalemia: Secondary | ICD-10-CM | POA: Diagnosis not present

## 2019-10-31 DIAGNOSIS — I495 Sick sinus syndrome: Secondary | ICD-10-CM | POA: Diagnosis not present

## 2019-10-31 DIAGNOSIS — R578 Other shock: Secondary | ICD-10-CM | POA: Diagnosis not present

## 2019-10-31 DIAGNOSIS — I69390 Apraxia following cerebral infarction: Secondary | ICD-10-CM | POA: Diagnosis not present

## 2019-10-31 DIAGNOSIS — J90 Pleural effusion, not elsewhere classified: Secondary | ICD-10-CM

## 2019-10-31 DIAGNOSIS — Z7901 Long term (current) use of anticoagulants: Secondary | ICD-10-CM | POA: Diagnosis not present

## 2019-10-31 DIAGNOSIS — Z930 Tracheostomy status: Secondary | ICD-10-CM | POA: Diagnosis not present

## 2019-10-31 DIAGNOSIS — A419 Sepsis, unspecified organism: Secondary | ICD-10-CM | POA: Diagnosis not present

## 2019-10-31 DIAGNOSIS — IMO0002 Reserved for concepts with insufficient information to code with codable children: Secondary | ICD-10-CM | POA: Diagnosis present

## 2019-10-31 DIAGNOSIS — I612 Nontraumatic intracerebral hemorrhage in hemisphere, unspecified: Secondary | ICD-10-CM

## 2019-10-31 DIAGNOSIS — R791 Abnormal coagulation profile: Secondary | ICD-10-CM | POA: Diagnosis present

## 2019-10-31 DIAGNOSIS — I69392 Facial weakness following cerebral infarction: Secondary | ICD-10-CM | POA: Diagnosis not present

## 2019-10-31 DIAGNOSIS — R5381 Other malaise: Secondary | ICD-10-CM | POA: Diagnosis not present

## 2019-10-31 DIAGNOSIS — G40901 Epilepsy, unspecified, not intractable, with status epilepticus: Secondary | ICD-10-CM | POA: Diagnosis not present

## 2019-10-31 DIAGNOSIS — I6389 Other cerebral infarction: Secondary | ICD-10-CM | POA: Diagnosis not present

## 2019-10-31 DIAGNOSIS — E785 Hyperlipidemia, unspecified: Secondary | ICD-10-CM | POA: Diagnosis not present

## 2019-10-31 DIAGNOSIS — I080 Rheumatic disorders of both mitral and aortic valves: Secondary | ICD-10-CM | POA: Diagnosis present

## 2019-10-31 DIAGNOSIS — D6489 Other specified anemias: Secondary | ICD-10-CM | POA: Diagnosis present

## 2019-10-31 DIAGNOSIS — R569 Unspecified convulsions: Secondary | ICD-10-CM | POA: Diagnosis not present

## 2019-10-31 DIAGNOSIS — R1312 Dysphagia, oropharyngeal phase: Secondary | ICD-10-CM | POA: Diagnosis not present

## 2019-10-31 DIAGNOSIS — D696 Thrombocytopenia, unspecified: Secondary | ICD-10-CM | POA: Diagnosis not present

## 2019-10-31 DIAGNOSIS — I10 Essential (primary) hypertension: Secondary | ICD-10-CM | POA: Diagnosis not present

## 2019-10-31 DIAGNOSIS — I69322 Dysarthria following cerebral infarction: Secondary | ICD-10-CM | POA: Diagnosis not present

## 2019-10-31 DIAGNOSIS — N179 Acute kidney failure, unspecified: Secondary | ICD-10-CM | POA: Diagnosis not present

## 2019-10-31 DIAGNOSIS — Z0189 Encounter for other specified special examinations: Secondary | ICD-10-CM

## 2019-10-31 DIAGNOSIS — R1311 Dysphagia, oral phase: Secondary | ICD-10-CM | POA: Diagnosis not present

## 2019-10-31 DIAGNOSIS — L97819 Non-pressure chronic ulcer of other part of right lower leg with unspecified severity: Secondary | ICD-10-CM | POA: Diagnosis not present

## 2019-10-31 DIAGNOSIS — Z4682 Encounter for fitting and adjustment of non-vascular catheter: Secondary | ICD-10-CM | POA: Diagnosis not present

## 2019-10-31 DIAGNOSIS — Z801 Family history of malignant neoplasm of trachea, bronchus and lung: Secondary | ICD-10-CM | POA: Diagnosis not present

## 2019-10-31 DIAGNOSIS — I152 Hypertension secondary to endocrine disorders: Secondary | ICD-10-CM | POA: Diagnosis present

## 2019-10-31 DIAGNOSIS — I619 Nontraumatic intracerebral hemorrhage, unspecified: Secondary | ICD-10-CM | POA: Diagnosis not present

## 2019-10-31 HISTORY — PX: IR PERCUTANEOUS ART THROMBECTOMY/INFUSION INTRACRANIAL INC DIAG ANGIO: IMG6087

## 2019-10-31 HISTORY — PX: IR CT HEAD LTD: IMG2386

## 2019-10-31 HISTORY — PX: RADIOLOGY WITH ANESTHESIA: SHX6223

## 2019-10-31 LAB — COMPREHENSIVE METABOLIC PANEL
ALT: 20 U/L (ref 0–44)
AST: 25 U/L (ref 15–41)
Albumin: 4.1 g/dL (ref 3.5–5.0)
Alkaline Phosphatase: 92 U/L (ref 38–126)
Anion gap: 10 (ref 5–15)
BUN: 11 mg/dL (ref 8–23)
CO2: 24 mmol/L (ref 22–32)
Calcium: 9 mg/dL (ref 8.9–10.3)
Chloride: 102 mmol/L (ref 98–111)
Creatinine, Ser: 0.69 mg/dL (ref 0.44–1.00)
GFR calc Af Amer: >60 mL/min (ref >60)
GFR calc non Af Amer: >60 mL/min (ref >60)
Glucose, Bld: 214 mg/dL — ABNORMAL HIGH (ref 70–99)
Potassium: 4.6 mmol/L (ref 3.5–5.1)
Sodium: 136 mmol/L (ref 135–145)
Total Bilirubin: 1.1 mg/dL (ref 0.3–1.2)
Total Protein: 7.4 g/dL (ref 6.5–8.1)

## 2019-10-31 LAB — DIFFERENTIAL
Abs Immature Granulocytes: 0.02 10*3/uL (ref 0.00–0.07)
Basophils Absolute: 0 10*3/uL (ref 0.0–0.1)
Basophils Relative: 0 %
Eosinophils Absolute: 0.1 10*3/uL (ref 0.0–0.5)
Eosinophils Relative: 1 %
Immature Granulocytes: 0 %
Lymphocytes Relative: 22 %
Lymphs Abs: 1.2 10*3/uL (ref 0.7–4.0)
Monocytes Absolute: 0.3 10*3/uL (ref 0.1–1.0)
Monocytes Relative: 5 %
Neutro Abs: 4.1 10*3/uL (ref 1.7–7.7)
Neutrophils Relative %: 72 %

## 2019-10-31 LAB — I-STAT CHEM 8, ED
BUN: 13 mg/dL (ref 8–23)
Calcium, Ion: 1.08 mmol/L — ABNORMAL LOW (ref 1.15–1.40)
Chloride: 103 mmol/L (ref 98–111)
Creatinine, Ser: 0.5 mg/dL (ref 0.44–1.00)
Glucose, Bld: 208 mg/dL — ABNORMAL HIGH (ref 70–99)
HCT: 43 % (ref 36.0–46.0)
Hemoglobin: 14.6 g/dL (ref 12.0–15.0)
Potassium: 4.6 mmol/L (ref 3.5–5.1)
Sodium: 138 mmol/L (ref 135–145)
TCO2: 29 mmol/L (ref 22–32)

## 2019-10-31 LAB — URINALYSIS, ROUTINE W REFLEX MICROSCOPIC
Bacteria, UA: NONE SEEN
Bilirubin Urine: NEGATIVE
Glucose, UA: 150 mg/dL — AB
Ketones, ur: 5 mg/dL — AB
Leukocytes,Ua: NEGATIVE
Nitrite: NEGATIVE
Protein, ur: 30 mg/dL — AB
Specific Gravity, Urine: 1.024 (ref 1.005–1.030)
pH: 7 (ref 5.0–8.0)

## 2019-10-31 LAB — APTT: aPTT: 27 seconds (ref 24–36)

## 2019-10-31 LAB — RAPID URINE DRUG SCREEN, HOSP PERFORMED
Amphetamines: NOT DETECTED
Barbiturates: NOT DETECTED
Benzodiazepines: NOT DETECTED
Cocaine: NOT DETECTED
Opiates: NOT DETECTED
Tetrahydrocannabinol: NOT DETECTED

## 2019-10-31 LAB — GLUCOSE, CAPILLARY: Glucose-Capillary: 234 mg/dL — ABNORMAL HIGH (ref 70–99)

## 2019-10-31 LAB — PROTIME-INR
INR: 1.2 (ref 0.8–1.2)
Prothrombin Time: 14.9 seconds (ref 11.4–15.2)

## 2019-10-31 LAB — CBC
HCT: 43.1 % (ref 36.0–46.0)
Hemoglobin: 14.3 g/dL (ref 12.0–15.0)
MCH: 30.3 pg (ref 26.0–34.0)
MCHC: 33.2 g/dL (ref 30.0–36.0)
MCV: 91.3 fL (ref 80.0–100.0)
Platelets: 151 10*3/uL (ref 150–400)
RBC: 4.72 MIL/uL (ref 3.87–5.11)
RDW: 12.6 % (ref 11.5–15.5)
WBC: 5.7 10*3/uL (ref 4.0–10.5)
nRBC: 0 % (ref 0.0–0.2)

## 2019-10-31 LAB — SODIUM: Sodium: 138 mmol/L (ref 135–145)

## 2019-10-31 LAB — CBG MONITORING, ED: Glucose-Capillary: 206 mg/dL — ABNORMAL HIGH (ref 70–99)

## 2019-10-31 LAB — ETHANOL: Alcohol, Ethyl (B): <10 mg/dL (ref <10)

## 2019-10-31 LAB — RESPIRATORY PANEL BY RT PCR (FLU A&B, COVID)
Influenza A by PCR: NEGATIVE
Influenza B by PCR: NEGATIVE
SARS Coronavirus 2 by RT PCR: NEGATIVE

## 2019-10-31 SURGERY — IR WITH ANESTHESIA
Anesthesia: General

## 2019-10-31 MED ORDER — TIROFIBAN HCL IN NACL 5-0.9 MG/100ML-% IV SOLN
INTRAVENOUS | Status: AC
  Filled 2019-10-31: qty 100

## 2019-10-31 MED ORDER — CLOPIDOGREL BISULFATE 300 MG PO TABS
ORAL_TABLET | ORAL | Status: AC
  Filled 2019-10-31: qty 1

## 2019-10-31 MED ORDER — VERAPAMIL HCL 2.5 MG/ML IV SOLN
INTRAVENOUS | Status: AC | PRN
  Administered 2019-10-31: 5 mg via INTRA_ARTERIAL

## 2019-10-31 MED ORDER — FENTANYL CITRATE (PF) 250 MCG/5ML IJ SOLN
INTRAMUSCULAR | Status: AC
  Filled 2019-10-31: qty 5

## 2019-10-31 MED ORDER — SODIUM CHLORIDE 3 % IV SOLN
INTRAVENOUS | Status: DC
  Administered 2019-10-31: 50 mL/h via INTRAVENOUS
  Administered 2019-11-01 – 2019-11-02 (×5): 75 mL/h via INTRAVENOUS
  Filled 2019-10-31 (×8): qty 500

## 2019-10-31 MED ORDER — SODIUM CHLORIDE 0.9 % IV SOLN: INTRAVENOUS | Status: DC

## 2019-10-31 MED ORDER — LIDOCAINE 2% (20 MG/ML) 5 ML SYRINGE
INTRAMUSCULAR | Status: DC | PRN
  Administered 2019-10-31: 60 mg via INTRAVENOUS

## 2019-10-31 MED ORDER — IOHEXOL 300 MG/ML  SOLN
150.0000 mL | Freq: Once | INTRAMUSCULAR | Status: AC | PRN
  Administered 2019-10-31: 65 mL via INTRA_ARTERIAL

## 2019-10-31 MED ORDER — PHENYLEPHRINE 40 MCG/ML (10ML) SYRINGE FOR IV PUSH (FOR BLOOD PRESSURE SUPPORT)
PREFILLED_SYRINGE | INTRAVENOUS | Status: DC | PRN
  Administered 2019-10-31 (×3): 80 ug via INTRAVENOUS

## 2019-10-31 MED ORDER — SUCCINYLCHOLINE CHLORIDE 200 MG/10ML IV SOSY
PREFILLED_SYRINGE | INTRAVENOUS | Status: DC | PRN
  Administered 2019-10-31: 100 mg via INTRAVENOUS

## 2019-10-31 MED ORDER — ACETAMINOPHEN 160 MG/5ML PO SOLN
650.0000 mg | ORAL | Status: DC | PRN
  Administered 2019-11-03 – 2019-11-17 (×21): 650 mg
  Filled 2019-10-31 (×20): qty 20.3

## 2019-10-31 MED ORDER — CHLORHEXIDINE GLUCONATE CLOTH 2 % EX PADS
6.0000 | MEDICATED_PAD | Freq: Every day | CUTANEOUS | Status: DC
  Administered 2019-11-01 – 2019-11-20 (×21): 6 via TOPICAL

## 2019-10-31 MED ORDER — TICAGRELOR 90 MG PO TABS
ORAL_TABLET | ORAL | Status: AC
  Filled 2019-10-31: qty 2

## 2019-10-31 MED ORDER — IOHEXOL 350 MG/ML SOLN
100.0000 mL | Freq: Once | INTRAVENOUS | Status: AC | PRN
  Administered 2019-10-31: 100 mL via INTRAVENOUS

## 2019-10-31 MED ORDER — CLEVIDIPINE BUTYRATE 0.5 MG/ML IV EMUL
0.0000 mg/h | INTRAVENOUS | Status: DC
  Administered 2019-10-31: 1 mg/h via INTRAVENOUS
  Administered 2019-11-01: 22:00:00 6 mg/h via INTRAVENOUS
  Administered 2019-11-01 (×2): 3 mg/h via INTRAVENOUS
  Administered 2019-11-02: 7 mg/h via INTRAVENOUS
  Administered 2019-11-02: 12:00:00 2 mg/h via INTRAVENOUS
  Administered 2019-11-03 (×2): 4 mg/h via INTRAVENOUS
  Administered 2019-11-03 – 2019-11-04 (×2): 2 mg/h via INTRAVENOUS
  Filled 2019-10-31 (×9): qty 50

## 2019-10-31 MED ORDER — LABETALOL HCL 5 MG/ML IV SOLN
INTRAVENOUS | Status: DC | PRN
  Administered 2019-10-31: 2.5 mg via INTRAVENOUS
  Administered 2019-10-31 (×2): 5 mg via INTRAVENOUS

## 2019-10-31 MED ORDER — ASPIRIN 81 MG PO CHEW
CHEWABLE_TABLET | ORAL | Status: AC
  Filled 2019-10-31: qty 1

## 2019-10-31 MED ORDER — IOHEXOL 240 MG/ML SOLN
INTRAMUSCULAR | Status: AC
  Filled 2019-10-31: qty 200

## 2019-10-31 MED ORDER — INSULIN ASPART 100 UNIT/ML ~~LOC~~ SOLN
0.0000 [IU] | SUBCUTANEOUS | Status: DC
  Administered 2019-11-01: 2 [IU] via SUBCUTANEOUS
  Administered 2019-11-01 (×2): 3 [IU] via SUBCUTANEOUS
  Administered 2019-11-01 (×2): 5 [IU] via SUBCUTANEOUS
  Administered 2019-11-01: 3 [IU] via SUBCUTANEOUS
  Administered 2019-11-02: 2 [IU] via SUBCUTANEOUS
  Administered 2019-11-02 (×2): 3 [IU] via SUBCUTANEOUS
  Administered 2019-11-02: 2 [IU] via SUBCUTANEOUS
  Administered 2019-11-02: 3 [IU] via SUBCUTANEOUS
  Administered 2019-11-03: 5 [IU] via SUBCUTANEOUS
  Administered 2019-11-03: 3 [IU] via SUBCUTANEOUS
  Administered 2019-11-03: 2 [IU] via SUBCUTANEOUS
  Administered 2019-11-03: 3 [IU] via SUBCUTANEOUS
  Administered 2019-11-04: 5 [IU] via SUBCUTANEOUS
  Administered 2019-11-04: 11 [IU] via SUBCUTANEOUS
  Administered 2019-11-04: 8 [IU] via SUBCUTANEOUS
  Administered 2019-11-04 (×3): 5 [IU] via SUBCUTANEOUS
  Administered 2019-11-05 (×4): 8 [IU] via SUBCUTANEOUS
  Administered 2019-11-05: 5 [IU] via SUBCUTANEOUS
  Administered 2019-11-05: 8 [IU] via SUBCUTANEOUS
  Administered 2019-11-06: 5 [IU] via SUBCUTANEOUS
  Administered 2019-11-06: 8 [IU] via SUBCUTANEOUS
  Administered 2019-11-06: 3 [IU] via SUBCUTANEOUS

## 2019-10-31 MED ORDER — PHENYLEPHRINE HCL-NACL 10-0.9 MG/250ML-% IV SOLN
INTRAVENOUS | Status: DC | PRN
  Administered 2019-10-31: 10 ug/min via INTRAVENOUS

## 2019-10-31 MED ORDER — ESMOLOL HCL 100 MG/10ML IV SOLN
INTRAVENOUS | Status: DC | PRN
  Administered 2019-10-31 (×2): 30 mg via INTRAVENOUS

## 2019-10-31 MED ORDER — ACETAMINOPHEN 325 MG PO TABS: 650.0000 mg | ORAL_TABLET | ORAL | Status: DC | PRN

## 2019-10-31 MED ORDER — ROCURONIUM BROMIDE 10 MG/ML (PF) SYRINGE
PREFILLED_SYRINGE | INTRAVENOUS | Status: DC | PRN
  Administered 2019-10-31: 60 mg via INTRAVENOUS

## 2019-10-31 MED ORDER — LACTATED RINGERS IV SOLN: INTRAVENOUS | Status: DC | PRN

## 2019-10-31 MED ORDER — ACETAMINOPHEN 650 MG RE SUPP
650.0000 mg | RECTAL | Status: DC | PRN
  Administered 2019-11-01: 650 mg via RECTAL
  Filled 2019-10-31 (×2): qty 1

## 2019-10-31 MED ORDER — STROKE: EARLY STAGES OF RECOVERY BOOK
Freq: Once | Status: AC
  Filled 2019-10-31: qty 1

## 2019-10-31 MED ORDER — VERAPAMIL HCL 2.5 MG/ML IV SOLN
INTRAVENOUS | Status: AC
  Filled 2019-10-31: qty 2

## 2019-10-31 MED ORDER — EPTIFIBATIDE 20 MG/10ML IV SOLN
INTRAVENOUS | Status: AC
  Filled 2019-10-31: qty 10

## 2019-10-31 MED ORDER — PROPOFOL 10 MG/ML IV BOLUS
INTRAVENOUS | Status: DC | PRN
  Administered 2019-10-31: 30 mg via INTRAVENOUS
  Administered 2019-10-31: 90 mg via INTRAVENOUS
  Administered 2019-10-31: 20 mg via INTRAVENOUS

## 2019-10-31 MED ORDER — IOHEXOL 300 MG/ML  SOLN
50.0000 mL | Freq: Once | INTRAMUSCULAR | Status: AC | PRN
  Administered 2019-10-31: 5 mL via INTRA_ARTERIAL

## 2019-10-31 MED ORDER — DEXAMETHASONE SODIUM PHOSPHATE 10 MG/ML IJ SOLN
INTRAMUSCULAR | Status: DC | PRN
  Administered 2019-10-31: 5 mg via INTRAVENOUS

## 2019-10-31 MED ORDER — ONDANSETRON HCL 4 MG/2ML IJ SOLN
INTRAMUSCULAR | Status: DC | PRN
  Administered 2019-10-31: 4 mg via INTRAVENOUS

## 2019-10-31 MED ORDER — SENNOSIDES-DOCUSATE SODIUM 8.6-50 MG PO TABS: 1.0000 | ORAL_TABLET | Freq: Every evening | ORAL | Status: DC | PRN

## 2019-10-31 MED ORDER — SUGAMMADEX SODIUM 200 MG/2ML IV SOLN
INTRAVENOUS | Status: DC | PRN
  Administered 2019-10-31: 200 mg via INTRAVENOUS

## 2019-10-31 NOTE — Sedation Documentation (Signed)
Assuming care of pt from Rochel Brome, Therapist, sports.

## 2019-10-31 NOTE — Anesthesia Preprocedure Evaluation (Signed)
Anesthesia Evaluation  Patient identified by MRN, date of birth, ID bandGeneral Assessment Comment:Awake  Reviewed: Allergy & Precautions, NPO status , Patient's Chart, lab work & pertinent test results, reviewed documented beta blocker date and time , Unable to perform ROS - Chart review onlyPreop documentation limited or incomplete due to emergent nature of procedure.  History of Anesthesia Complications Negative for: history of anesthetic complications  Airway Mallampati: II  TM Distance: >3 FB     Dental  (+) Dental Advisory Given, Teeth Intact   Pulmonary neg pulmonary ROS, neg recent URI,    breath sounds clear to auscultation       Cardiovascular hypertension, Pt. on medications and Pt. on home beta blockers + dysrhythmias Atrial Fibrillation + pacemaker + Valvular Problems/Murmurs  Rhythm:Irregular  S/p MVR  Normal LV size and thickness.  EF estimated 50%.  Inferobasal HK and flattened septum c/w with elevated PA pressures. Mild functional mitral stenosis at rest (postoperative).  Moderate left atrial dilation and mild right atrial dilation.  Mean PA pressures estimated 52 mmHg.     Neuro/Psych CVA, Residual Symptoms negative psych ROS   GI/Hepatic negative GI ROS, Neg liver ROS,   Endo/Other  diabetes, Type 2, Oral Hypoglycemic Agents  Renal/GU negative Renal ROS     Musculoskeletal   Abdominal   Peds  Hematology   Anesthesia Other Findings   Reproductive/Obstetrics                             Anesthesia Physical Anesthesia Plan  ASA: III and emergent  Anesthesia Plan: General   Post-op Pain Management:    Induction: Intravenous, Rapid sequence and Cricoid pressure planned  PONV Risk Score and Plan: 3 and Ondansetron and Dexamethasone  Airway Management Planned: Oral ETT  Additional Equipment:   Intra-op Plan:   Post-operative Plan: Extubation in OR and Possible Post-op  intubation/ventilation  Informed Consent:   Plan Discussed with: CRNA and Surgeon  Anesthesia Plan Comments:         Anesthesia Quick Evaluation

## 2019-10-31 NOTE — Anesthesia Procedure Notes (Signed)
Procedure Name: Intubation Date/Time: 10/31/2019 6:48 PM Performed by: Jearld Pies, CRNA Pre-anesthesia Checklist: Patient identified, Emergency Drugs available, Suction available and Patient being monitored Patient Re-evaluated:Patient Re-evaluated prior to induction Oxygen Delivery Method: Circle System Utilized Preoxygenation: Pre-oxygenation with 100% oxygen Induction Type: IV induction, Rapid sequence and Cricoid Pressure applied Laryngoscope Size: Glidescope and 4 Grade View: Grade I Tube type: Oral Tube size: 7.0 mm Number of attempts: 1 Airway Equipment and Method: Stylet and Video-laryngoscopy Placement Confirmation: ETT inserted through vocal cords under direct vision,  positive ETCO2 and breath sounds checked- equal and bilateral Secured at: 22 cm Tube secured with: Tape Dental Injury: Teeth and Oropharynx as per pre-operative assessment  Comments: Glidescope utilized d/t unknown COVID status.

## 2019-10-31 NOTE — ED Triage Notes (Signed)
Pt BIB [facility] for eval of CODE STROKE. EMS reports pt LKN was 1300 this afternoon when daughter saw her. Husband and daughter returned home later on and noted the pt to be experiencing L sided facial droop, L sided weakness, L sided neglect and R gaze preference along w/ slurred speech. Pt is anticoagulated on coumadin for chronic afib, arrives GCS 14, following commands. Slurred speech, hypertensive w/ EMS to 180s.

## 2019-10-31 NOTE — ED Provider Notes (Signed)
[facility] EMERGENCY DEPARTMENT Provider Note   CSN: [id number] Arrival date & time: 10/31/19  1742  An emergency department physician performed an initial assessment on this suspected stroke patient at 1743.  History CC: Stroke alert  Krystal Little is a 64 y.o. female history of chronic A. fib, hypertension, presented to the emergency department with left-sided weakness as a stroke alert.  Patient reportedly in usual state of health around noon today.  She was found by her husband around 3 PM noted to have left-sided weakness of the arm and leg and right deviated gaze.  Patient also complaining of a headache on arrival.  She is on coumadin as an outpatient  On arrival taken directly to [facility] with neurology team at bedside.  Airway patent  HPI     Past Medical History:  Diagnosis Date  . Chronic atrial fibrillation ([place]) 1990s   s/p DCCV then attempted ablation of complex A Flutter (@ [facility] - Dr. Deno Etienne), failed antiarrhythmics --> INR folllwed @ [facility] ->> now status post pacemaker placement with underlying A. fib.  . Diabetes mellitus type 2, uncontrolled, without complications    On Oral Medications ([facility])  . Essential hypertension   . History of cardiac catheterization 2001   R&LHC - normal Coronaries, no evidence of Restictive Cardiomyopathy or Constrictive Pericarditis (also @ [facility])  . Hx of sick sinus syndrome 07/1999   Wtih symptomatic bradycardia - syncope ([place])  . 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 Stroke  ([place]) 10/31/2019  . Chronic venous insufficiency 10/07/2019  . Wound of RLE  08/18/2019  . Hyperlipidemia associated with type 2 diabetes mellitus ([place]) 09/25/2016  . Diastolic dysfunction without heart failure 06/02/2015  . Health care maintenance 03/24/2013  . Hypertension associated with diabetes ([place]) 10/16/2011  . Long term current use of anticoagulant therapy 08/27/2010  . DM (diabetes mellitus), type 2, uncontrolled ([place]) 02/11/2008  . MITRAL REGURGITATION - status post MVR 09/14/1999  . S/P MVR (mitral valve repair) 08/09/1999  . Chronic atrial fibrillation; CHA2DS2-VASc Score 3 - on Warfarin 08/03/1999  . S/P placement of cardiac pacemaker 07/18/1999    Past Surgical History:  Procedure Laterality Date  . CARDIAC CATHETERIZATION  08/03/1999   [place] - normal Coronaries; no sign of constriction or restrictive cardiomypathy  . IR PERCUTANEOUS ART THROMBECTOMY/INFUSION INTRACRANIAL INC DIAG ANGIO  10/31/2019      . MITRAL VALVE REPAIR  07/1999   Both Ant& Post leaflet repair - quadrangular resection&caudal transposition, #32 Sequin Annuloplasty ring  . PACEMAKER GENERATOR CHANGE  11/26/2008   Medtronic Adapta L(? if it has been checked since 02/2013)  . PACEMAKER INSERTION  07/1999   for SSS in setting of Chronic Afib; Medtronic Kappa [id number], [id number] H.  . TRANSTHORACIC ECHOCARDIOGRAM  05/2018   Normal LV size and thickness.  EF estimated 50%.  Inferobasal HK and flattened septum c/w with elevated PA pressures. Mild functional mitral stenosis at rest (postoperative).  Moderate left atrial dilation and mild right atrial dilation.  Mean PA pressures estimated 52 mmHg.  OB History    Gravida  2   Para  2   Term  2   Preterm  0   AB  0   Living  1     SAB  0   TAB  0   Ectopic  0   Multiple  0   Live Births              Family History  Problem Relation Age of Onset  . Hypertension Mother   . Diabetes Sister   . Cancer Brother        Rectal  . Lung  cancer Brother     Social History   Tobacco Use  . Smoking status: Never Smoker  . Smokeless tobacco: Never Used  Substance Use Topics  . Alcohol use: Yes    Alcohol/week: 0.0 standard drinks    Comment: occasional, once every month or two- glass of wine   . Drug use: No    Home Medications Prior to Admission medications   Medication Sig Start Date End Date Taking? Authorizing Provider  atenolol (TENORMIN) 50 MG tablet TAKE 1 TABLET(50 MG) BY MOUTH DAILY Patient taking differently: Take 50 mg by mouth daily.  09/26/19   Leonie Man, MD  diltiazem (CARDIZEM CD) 180 MG 24 hr capsule TAKE 1 CAPSULE(180 MG) BY MOUTH DAILY Patient taking differently: Take 180 mg by mouth daily.  09/26/19   Leonie Man, MD  lisinopril (ZESTRIL) 5 MG tablet TAKE 1 TABLET(5 MG) BY MOUTH DAILY Patient taking differently: Take 5 mg by mouth daily.  08/26/19   Leonie Man, MD  metFORMIN (GLUCOPHAGE) 1000 MG tablet Take 1,000 mg by mouth 2 (two) times daily. 09/12/19   [provider]  metFORMIN (GLUCOPHAGE-XR) 500 MG 24 hr tablet Take 500 mg by mouth 2 (two) times daily.  01/25/18   [provider]  warfarin (COUMADIN) 5 MG tablet TAKE 1 TABLET BY MOUTH DAILY EXCEPT TAKE 1 AND 1/2 TABLET ON MONDAY AND FRIDAY Patient taking differently: Take 5 mg by mouth See admin instructions. TAKE 1 TABLET BY MOUTH DAILY EXCEPT TAKE 1 AND 1/2 TABLET ON MONDAY AND FRIDAY 08/05/18   [place] Lope, DO    Allergies    Patient has no known allergies.  Review of Systems   Review of Systems  Unable to perform ROS: Acuity of condition    Physical Exam Updated Vital Signs BP (!) 134/106   Pulse 94   Temp 100 F (37.8 C) (Oral)   Resp 18   Ht 5\' 4"  (1.626 m)   Wt 63.6 kg   SpO2 100%   BMI 24.07 kg/m   Physical Exam HENT:     Head: Normocephalic and atraumatic.  Eyes:     Conjunctiva/sclera: Conjunctivae normal.     Pupils: Pupils are equal, round, and reactive to light.  Cardiovascular:       Rate and Rhythm: Normal rate.     Pulses: Normal pulses.  Pulmonary:     Effort: Pulmonary effort is normal.     Breath sounds: Normal breath sounds.  Abdominal:     General: Abdomen is flat.     Tenderness: There is no abdominal tenderness.  Musculoskeletal:        General: No swelling or tenderness.  Skin:    General: Skin is warm and dry.  Neurological:     Mental Status: She is alert.     GCS: GCS eye subscore is 4. GCS verbal subscore  is 5. GCS motor subscore is 6.     Sensory: Sensation is intact.     Comments: Right deviated gaze Weakness of the left arm and left leg     ED Results / Procedures / Treatments   Labs (all labs ordered are listed, but only abnormal results are displayed) Labs Reviewed  COMPREHENSIVE METABOLIC PANEL - Abnormal; Notable for the following components:      Result Value   Glucose, Bld 214 (*)    All other components within normal limits  URINALYSIS, ROUTINE W REFLEX MICROSCOPIC - Abnormal; Notable for the following components:   Color, Urine STRAW (*)    Glucose, UA 150 (*)    Hgb urine dipstick SMALL (*)    Ketones, ur 5 (*)    Protein, ur 30 (*)    All other components within normal limits  HEMOGLOBIN A1C - Abnormal; Notable for the following components:   Hgb A1c MFr Bld 9.7 (*)    All other components within normal limits  LIPID PANEL - Abnormal; Notable for the following components:   Cholesterol 239 (*)    LDL Cholesterol 158 (*)    All other components within normal limits  GLUCOSE, CAPILLARY - Abnormal; Notable for the following components:   Glucose-Capillary 234 (*)    All other components within normal limits  GLUCOSE, CAPILLARY - Abnormal; Notable for the following components:   Glucose-Capillary 238 (*)    All other components within normal limits  GLUCOSE, CAPILLARY - Abnormal; Notable for the following components:   Glucose-Capillary 195 (*)    All other components within normal limits  CBC - Abnormal; Notable for  the following components:   WBC 13.8 (*)    All other components within normal limits  BASIC METABOLIC PANEL - Abnormal; Notable for the following components:   Potassium 3.4 (*)    Glucose, Bld 239 (*)    All other components within normal limits  GLUCOSE, CAPILLARY - Abnormal; Notable for the following components:   Glucose-Capillary 197 (*)    All other components within normal limits  I-STAT CHEM 8, ED - Abnormal; Notable for the following components:   Glucose, Bld 208 (*)    Calcium, Ion 1.08 (*)    All other components within normal limits  CBG MONITORING, ED - Abnormal; Notable for the following components:   Glucose-Capillary 206 (*)    All other components within normal limits  RESPIRATORY PANEL BY RT PCR (FLU A&B, COVID)  MRSA PCR SCREENING  ETHANOL  PROTIME-INR  APTT  CBC  DIFFERENTIAL  RAPID URINE DRUG SCREEN, HOSP PERFORMED  SODIUM  SODIUM  SODIUM  SODIUM  SODIUM    EKG None  Radiology CT Code Stroke CTA Head W/WO contrast  Result Date: 10/31/2019 CLINICAL DATA:  Last seen normal 5 hours ago. New onset left-sided weakness. Left facial droop and gaze. EXAM: CT ANGIOGRAPHY HEAD AND NECK CT PERFUSION BRAIN TECHNIQUE: Multidetector CT imaging of the head and neck was performed using the standard protocol during bolus administration of intravenous contrast. Multiplanar CT image reconstructions and MIPs were obtained to evaluate the vascular anatomy. Carotid stenosis measurements (when applicable) are obtained utilizing NASCET criteria, using the distal internal carotid diameter as the denominator. Multiphase CT imaging of the brain was performed following IV bolus contrast injection. Subsequent parametric perfusion maps were calculated using RAPID software. CONTRAST:  159mL OMNIPAQUE IOHEXOL 350 MG/ML SOLN COMPARISON:  CT head without contrast 10/31/2019. FINDINGS: CTA NECK FINDINGS Aortic arch: A 4 vessel  arch configuration is present. Left vertebral artery originates  directly from the arch. No significant atherosclerotic calcification or stenosis is present. Right carotid system: The right common carotid artery is within normal limits. Atherosclerotic changes are present at the proximal right ICA without a significant stenosis. Cervical right ICA is otherwise. Left carotid system: The left common carotid artery is within normal limits. Minimal atherosclerotic changes are present without significant stenosis. Cervical left ICA is otherwise normal. Vertebral arteries: Right vertebral artery originates from the subclavian artery without significant stenosis. It is the dominant vessel. No significant stenosis is present in either vertebral artery in the neck. Skeleton: Mild degenerative changes are present at C5-6. Uncovertebral spurring is present the right C4-5. No focal lytic or blastic lesions are present. Other neck: The soft tissues the neck are otherwise unremarkable. Upper chest: The lung apices are clear. Thoracic inlet is within normal limits. Review of the MIP images confirms the above findings CTA HEAD FINDINGS Anterior circulation: The right internal carotid artery is occluded at the terminus. Clot extends beyond the right MCA bifurcation. Anterior branches opacify. Poor collateralization is evident posteriorly. The left internal carotid artery is within normal limits through the terminus. Left A1 and M1 segments are normal. Anterior communicating artery is patent. Both ACA vessels fill from the left. Retrograde right A1 flow does not reach the ICA terminus secondary to the T occlusion. Left MCA bifurcation and branch vessels are within normal limits. Posterior circulation: The right vertebral artery is dominant vessel. PICA origins are visualized and within normal limits. Basilar artery is normal. The left posterior cerebral artery originates from the basilar tip. The right posterior cerebral artery is of fetal type is fed from just below the thrombus. PCA branch  vessels are within normal limits bilaterally. Venous sinuses: The dural sinuses are patent. Straight sinus deep cerebral veins are intact. Cortical veins are within normal limits. No vascular malformation is present. Anatomic variants: Fetal type right posterior cerebral artery. Review of the MIP images confirms the above findings CT Brain Perfusion Findings: ASPECTS: 4/10 CBF (<30%) Volume: 163mL Perfusion (Tmax>6.0s) volume: 2109mL Mismatch Volume: 34mL Infarction Location:Right MCA territory IMPRESSION: 1. Confirmed large right MCA territory infarct with core volume of 107 mL. 2. Large mismatch volume of 9.6 with a mismatch ratio of 1.9. 3. T occlusion of right ICA terminus. 4. Thrombus extends through the right MCA bifurcation with poor collaterals. 5. Minimal atherosclerotic changes at the carotid bifurcations bilaterally without significant stenosis. 6. No significant intracranial disease on the left or in the posterior circulation. 7. Fetal type right posterior cerebral artery originates just below the terminal ICA thrombus. Electronically Signed   By: San Morelle M.D.   On: 10/31/2019 18:17   CT HEAD WO CONTRAST  Result Date: 11/01/2019 CLINICAL DATA:  Stroke follow-up EXAM: CT HEAD WITHOUT CONTRAST TECHNIQUE: Contiguous axial images were obtained from the base of the skull through the vertex without intravenous contrast. COMPARISON:  CT perfusion scan 10/31/2019 FINDINGS: Brain: Large area of hypoattenuation within the right MCA territory. The right lateral ventricle is compressed. There is leftward midline shift that measures 6 mm at the level of the foramina of [place]. Multifocal hyperdensity within the right MCA territory is consistent with hemorrhage or contrast staining. The more peripheral hyperdensities are favored to be hemorrhagic. The largest of these measures 6 mm. Vascular: No hyperdense vessel or unexpected calcification. Skull: Normal. Negative for fracture or focal lesion.  Sinuses/Orbits: No acute finding. Other: None. IMPRESSION: 1. Large area of hypoattenuation  within the right MCA territory, consistent with subacute infarct. 2. Multifocal hyperdensity within the right MCA territory is consistent with hemorrhage or contrast staining. The more peripheral hyperdensities are favored to be hemorrhagic. 3. 6 mm leftward midline shift at the level of the foramina of [place]. Electronically Signed   By: Ulyses Jarred M.D.   On: 11/01/2019 02:45   CT Code Stroke CTA Neck W/WO contrast  Result Date: 10/31/2019 CLINICAL DATA:  Last seen normal 5 hours ago. New onset left-sided weakness. Left facial droop and gaze. EXAM: CT ANGIOGRAPHY HEAD AND NECK CT PERFUSION BRAIN TECHNIQUE: Multidetector CT imaging of the head and neck was performed using the standard protocol during bolus administration of intravenous contrast. Multiplanar CT image reconstructions and MIPs were obtained to evaluate the vascular anatomy. Carotid stenosis measurements (when applicable) are obtained utilizing NASCET criteria, using the distal internal carotid diameter as the denominator. Multiphase CT imaging of the brain was performed following IV bolus contrast injection. Subsequent parametric perfusion maps were calculated using RAPID software. CONTRAST:  165mL OMNIPAQUE IOHEXOL 350 MG/ML SOLN COMPARISON:  CT head without contrast 10/31/2019. FINDINGS: CTA NECK FINDINGS Aortic arch: A 4 vessel arch configuration is present. Left vertebral artery originates directly from the arch. No significant atherosclerotic calcification or stenosis is present. Right carotid system: The right common carotid artery is within normal limits. Atherosclerotic changes are present at the proximal right ICA without a significant stenosis. Cervical right ICA is otherwise. Left carotid system: The left common carotid artery is within normal limits. Minimal atherosclerotic changes are present without significant stenosis. Cervical left ICA is  otherwise normal. Vertebral arteries: Right vertebral artery originates from the subclavian artery without significant stenosis. It is the dominant vessel. No significant stenosis is present in either vertebral artery in the neck. Skeleton: Mild degenerative changes are present at C5-6. Uncovertebral spurring is present the right C4-5. No focal lytic or blastic lesions are present. Other neck: The soft tissues the neck are otherwise unremarkable. Upper chest: The lung apices are clear. Thoracic inlet is within normal limits. Review of the MIP images confirms the above findings CTA HEAD FINDINGS Anterior circulation: The right internal carotid artery is occluded at the terminus. Clot extends beyond the right MCA bifurcation. Anterior branches opacify. Poor collateralization is evident posteriorly. The left internal carotid artery is within normal limits through the terminus. Left A1 and M1 segments are normal. Anterior communicating artery is patent. Both ACA vessels fill from the left. Retrograde right A1 flow does not reach the ICA terminus secondary to the T occlusion. Left MCA bifurcation and branch vessels are within normal limits. Posterior circulation: The right vertebral artery is dominant vessel. PICA origins are visualized and within normal limits. Basilar artery is normal. The left posterior cerebral artery originates from the basilar tip. The right posterior cerebral artery is of fetal type is fed from just below the thrombus. PCA branch vessels are within normal limits bilaterally. Venous sinuses: The dural sinuses are patent. Straight sinus deep cerebral veins are intact. Cortical veins are within normal limits. No vascular malformation is present. Anatomic variants: Fetal type right posterior cerebral artery. Review of the MIP images confirms the above findings CT Brain Perfusion Findings: ASPECTS: 4/10 CBF (<30%) Volume: 144mL Perfusion (Tmax>6.0s) volume: 222mL Mismatch Volume: 68mL Infarction  Location:Right MCA territory IMPRESSION: 1. Confirmed large right MCA territory infarct with core volume of 107 mL. 2. Large mismatch volume of 9.6 with a mismatch ratio of 1.9. 3. T occlusion of right ICA terminus.  4. Thrombus extends through the right MCA bifurcation with poor collaterals. 5. Minimal atherosclerotic changes at the carotid bifurcations bilaterally without significant stenosis. 6. No significant intracranial disease on the left or in the posterior circulation. 7. Fetal type right posterior cerebral artery originates just below the terminal ICA thrombus. Electronically Signed   By: San Morelle M.D.   On: 10/31/2019 18:17   CT Code Stroke Cerebral Perfusion with contrast  Result Date: 10/31/2019 CLINICAL DATA:  Last seen normal 5 hours ago. New onset left-sided weakness. Left facial droop and gaze. EXAM: CT ANGIOGRAPHY HEAD AND NECK CT PERFUSION BRAIN TECHNIQUE: Multidetector CT imaging of the head and neck was performed using the standard protocol during bolus administration of intravenous contrast. Multiplanar CT image reconstructions and MIPs were obtained to evaluate the vascular anatomy. Carotid stenosis measurements (when applicable) are obtained utilizing NASCET criteria, using the distal internal carotid diameter as the denominator. Multiphase CT imaging of the brain was performed following IV bolus contrast injection. Subsequent parametric perfusion maps were calculated using RAPID software. CONTRAST:  158mL OMNIPAQUE IOHEXOL 350 MG/ML SOLN COMPARISON:  CT head without contrast 10/31/2019. FINDINGS: CTA NECK FINDINGS Aortic arch: A 4 vessel arch configuration is present. Left vertebral artery originates directly from the arch. No significant atherosclerotic calcification or stenosis is present. Right carotid system: The right common carotid artery is within normal limits. Atherosclerotic changes are present at the proximal right ICA without a significant stenosis. Cervical right  ICA is otherwise. Left carotid system: The left common carotid artery is within normal limits. Minimal atherosclerotic changes are present without significant stenosis. Cervical left ICA is otherwise normal. Vertebral arteries: Right vertebral artery originates from the subclavian artery without significant stenosis. It is the dominant vessel. No significant stenosis is present in either vertebral artery in the neck. Skeleton: Mild degenerative changes are present at C5-6. Uncovertebral spurring is present the right C4-5. No focal lytic or blastic lesions are present. Other neck: The soft tissues the neck are otherwise unremarkable. Upper chest: The lung apices are clear. Thoracic inlet is within normal limits. Review of the MIP images confirms the above findings CTA HEAD FINDINGS Anterior circulation: The right internal carotid artery is occluded at the terminus. Clot extends beyond the right MCA bifurcation. Anterior branches opacify. Poor collateralization is evident posteriorly. The left internal carotid artery is within normal limits through the terminus. Left A1 and M1 segments are normal. Anterior communicating artery is patent. Both ACA vessels fill from the left. Retrograde right A1 flow does not reach the ICA terminus secondary to the T occlusion. Left MCA bifurcation and branch vessels are within normal limits. Posterior circulation: The right vertebral artery is dominant vessel. PICA origins are visualized and within normal limits. Basilar artery is normal. The left posterior cerebral artery originates from the basilar tip. The right posterior cerebral artery is of fetal type is fed from just below the thrombus. PCA branch vessels are within normal limits bilaterally. Venous sinuses: The dural sinuses are patent. Straight sinus deep cerebral veins are intact. Cortical veins are within normal limits. No vascular malformation is present. Anatomic variants: Fetal type right posterior cerebral artery. Review  of the MIP images confirms the above findings CT Brain Perfusion Findings: ASPECTS: 4/10 CBF (<30%) Volume: 143mL Perfusion (Tmax>6.0s) volume: 266mL Mismatch Volume: 46mL Infarction Location:Right MCA territory IMPRESSION: 1. Confirmed large right MCA territory infarct with core volume of 107 mL. 2. Large mismatch volume of 9.6 with a mismatch ratio of 1.9. 3. T occlusion of  right ICA terminus. 4. Thrombus extends through the right MCA bifurcation with poor collaterals. 5. Minimal atherosclerotic changes at the carotid bifurcations bilaterally without significant stenosis. 6. No significant intracranial disease on the left or in the posterior circulation. 7. Fetal type right posterior cerebral artery originates just below the terminal ICA thrombus. Electronically Signed   By: San Morelle M.D.   On: 10/31/2019 18:17   DG Chest Port 1 View  Result Date: 10/31/2019 CLINICAL DATA:  Stroke. EXAM: PORTABLE CHEST 1 VIEW COMPARISON:  11/26/2008 FINDINGS: Right-sided pacemaker in place. Post median sternotomy. Mild cardiomegaly. No pulmonary edema, focal airspace disease, pleural effusion or pneumothorax. Bones appear under mineralized. IMPRESSION: Mild cardiomegaly. No acute chest findings. Electronically Signed   By: Keith Rake M.D.   On: 10/31/2019 21:54   CUP PACEART INCLINIC DEVICE CHECK  Result Date: 10/30/2019 Pacemaker check in clinic. Normal device function. Thresholds, sensing, impedances consistent with previous measurements. Device programmed to maximize longevity. No high ventricular rates noted. Device programmed at appropriate safety margins. Histogram  distribution appropriate for patient activity level. Device programmed to optimize intrinsic conduction. Estimated longevity4 years. Patient enrolled in remote follow-up. Patient education completed. ROV w/ WC.Lavenia Atlas, BSN, RN  CT HEAD CODE STROKE WO CONTRAST  Result Date: 10/31/2019 CLINICAL DATA:  Code stroke. Acute onset of  left-sided facial droop and abnormal gaze. EXAM: CT HEAD WITHOUT CONTRAST TECHNIQUE: Contiguous axial images were obtained from the base of the skull through the vertex without intravenous contrast. COMPARISON:  None. FINDINGS: Brain: A large right MCA infarct is noted. There is hypoattenuation involving the right caudate head, internal capsule, and lentiform nucleus. Hypoattenuation is seen at the insular ribbon, right superior temporal gyrus, and super ganglionic posterior right frontal lobe. No acute hemorrhage is present. No mass effect scratched at there is some partial effacement of the sulci. No midline shift is present. Basal ganglia are normal on the left. The brainstem and cerebellum are within normal limits. The ventricles are of normal size. No significant extraaxial fluid collection is present. Vascular: A hyperdense right MCA and ICA terminus is noted. Hyperdensity extends beyond the right MCA bifurcation. The left MCA is unremarkable. Atherosclerotic calcifications are present within the cavernous internal carotid arteries bilaterally. Skull: 1 Calvarium is intact. No focal lytic or blastic lesions are present. No significant extracranial soft tissue lesion is present. Sinuses/Orbits: The paranasal sinuses and mastoid air cells are clear. The globes and orbits are within normal limits. ASPECTS [facility] (1-Rh) Stroke Program Early CT Score) - Ganglionic level infarction (caudate, lentiform nuclei, internal capsule, insula, M1-M3 cortex): 2/7 - Supraganglionic infarction (M4-M6 cortex): 2/3 Total score (0-10 with 10 being normal): 4/10 IMPRESSION: 1. Large right MCA territory nonhemorrhagic infarct is described. 2. Hyperdense right MCA suggesting extensive thrombus from the right ICA terminus through the MCA bifurcation 3. Some mass effect with partial effacement the sulci on the right. No midline shift. 4. ASPECTS is 4/10 The above was relayed via text pager to Dr. Roland Rack on 10/31/2019 at 17:59 .  Electronically Signed   By: San Morelle M.D.   On: 10/31/2019 18:03   [place] EKG SITE RITE  Result Date: 11/01/2019 If Site Rite image not attached, placement could not be confirmed due to current cardiac rhythm.   Procedures Procedures (including critical care time)  Medications Ordered in ED Medications  verapamil (ISOPTIN) 2.5 MG/ML injection (has no administration in time range)  iohexol (OMNIPAQUE) 240 MG/ML injection (has no administration in time range)  acetaminophen (TYLENOL) tablet  650 mg ( Oral See Alternative 11/01/19 0921)    Or  acetaminophen (TYLENOL) 160 MG/5ML solution 650 mg ( Per Tube See Alternative 11/01/19 0921)    Or  acetaminophen (TYLENOL) suppository 650 mg (650 mg Rectal Given 11/01/19 0921)  fentaNYL citrate (PF) (SUBLIMAZE) 250 MCG/5ML injection (has no administration in time range)  Chlorhexidine Gluconate Cloth 2 % PADS 6 each (6 each Topical Given 11/01/19 0529)  clevidipine (CLEVIPREX) infusion 0.5 mg/mL (3 mg/hr Intravenous Restarted 11/01/19 1118)  sodium chloride (hypertonic) 3 % solution (75 mL/hr Intravenous New Bag/Given 11/01/19 0722)  insulin aspart (novoLOG) injection 0-15 Units (3 Units Subcutaneous Given 11/01/19 0850)  promethazine (PHENERGAN) injection 12.5 mg (has no administration in time range)  ondansetron (ZOFRAN) injection 4 mg (4 mg Intravenous Given 11/01/19 0640)  pantoprazole (PROTONIX) injection 40 mg (40 mg Intravenous Given 11/01/19 0939)  diltiazem (CARDIZEM) tablet 60 mg (60 mg Per Tube Not Given 11/01/19 1015)  atenolol (TENORMIN) tablet 50 mg (50 mg Per Tube Not Given 11/01/19 1015)  senna-docusate (Senokot-S) tablet 1 tablet (has no administration in time range)  potassium chloride 10 mEq in 100 mL IVPB (has no administration in time range)  iohexol (OMNIPAQUE) 350 MG/ML injection 100 mL (100 mLs Intravenous Contrast Given 10/31/19 1751)   stroke: mapping our early stages of recovery book ( Does not apply Given 10/31/19 2030)    iohexol (OMNIPAQUE) 300 MG/ML solution 50 mL (5 mLs Intra-arterial Contrast Given 10/31/19 2016)  iohexol (OMNIPAQUE) 300 MG/ML solution 150 mL (65 mLs Intra-arterial Contrast Given 10/31/19 2015)  verapamil (ISOPTIN) injection (5 mg Intra-arterial Given 10/31/19 1954)    ED Course  I have reviewed the triage vital signs and the nursing notes.  Pertinent labs & imaging results that were available during my care of the patient were reviewed by me and considered in my medical decision making (see chart for details).  64 yo female presenting as a code stroke, discovered around 3 pm by husband with left sided weakness, right deviated eye gaze.  On arrival I assessed airway which was patent, GCS 15 and speaking to me.  She went directly to CT scan with neurology team and myself.  Initial scans concerning for LVO.  Patient taken emergently to IR lab for endovascular thrombectomy.     Final Clinical Impression(s) / ED Diagnoses Final diagnoses:  Stroke [facility])  Cerebrovascular accident (CVA) due to embolism of right middle cerebral artery ([place])  Cerebrovascular accident (CVA), unspecified mechanism [facility])    Rx / DC Orders ED Discharge Orders    None       Wyvonnia Dusky, MD 11/01/19 1207

## 2019-10-31 NOTE — Progress Notes (Signed)
Orthopedic Tech Progress Note Patient Details:  Krystal Little 17-Sep-1955 [id number]  Ortho Devices Type of Ortho Device: Knee Immobilizer Ortho Device/Splint Location: RLE Ortho Device/Splint Interventions: Application   Post Interventions Patient Tolerated: Well Instructions Provided: Adjustment of device   [name] [name] 10/31/2019, 9:34 PM

## 2019-10-31 NOTE — H&P (Addendum)
NEURO HOSPITALIST H&P   Requesting Physician: Dr. Langston Masker    Chief Complaint: right gaze and left weakness/neglect  History obtained from:  Chart review/ EMS HPI:                                                                                                                                         Krystal Little is an 64 y.o. female  With PMH HTN, DM2, chronic a. Fib (coumadin) s/p pace maker who presented to [facility] ED as a code stroke for  Right gaze deviation and left side weakness.  Per EMS daughter was at the mother's house visiting. When the daughter left her mother at 27 she was completely normal. At 1500 when patient's husband returned he found his wife on the floor. She had been incontinent of urine. Patient is on coumadin.  ED course:  CTH: no hemorrhage; ASPECTS 4 CTA: M1 occlusion BP: 143/76 BG: 208  tPA Given: No: on coumadin  Modified Rankin: Rankin Score=0 NIHSS:14      Past Medical History:  Diagnosis Date  . Chronic atrial fibrillation ([place]) 1990s   s/p DCCV then attempted ablation of complex A Flutter (@ [facility] - Dr. Deno Etienne), failed antiarrhythmics --> INR folllwed @ [facility] FP ->> now status post pacemaker placement with underlying A. fib.  . Diabetes mellitus type 2, uncontrolled, without complications    On Oral Medications ([place] [facility])  . Essential hypertension   . History of cardiac catheterization 2001   R&LHC - normal Coronaries, no evidence of Restictive Cardiomyopathy or Constrictive Pericarditis (also @ [facility])  . Hx of sick sinus syndrome 07/1999   Wtih symptomatic bradycardia - syncope (Tachy-Brady)  . 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 CARDIAC CATHETERIZATION  08/03/1999   [place] - normal Coronaries; no sign of constriction or restrictive cardiomypathy  . MITRAL VALVE REPAIR  07/1999   Both Ant& Post leaflet repair - quadrangular resection&caudal transposition, #32 Sequin Annuloplasty ring  . PACEMAKER GENERATOR CHANGE  11/26/2008   Medtronic Adapta L(? if it has been checked since 02/2013)  . PACEMAKER INSERTION  07/1999   for SSS in setting of Chronic Afib; Medtronic Kappa [id number], [id number] H.  . TRANSTHORACIC  ECHOCARDIOGRAM  05/2018   Normal LV size and thickness.  EF estimated 50%.  Inferobasal HK and flattened septum c/w with elevated PA pressures. Mild functional mitral stenosis at rest (postoperative).  Moderate left atrial dilation and mild right atrial dilation.  Mean PA pressures estimated 52 mmHg.    Family History  Problem Relation Age of Onset  . Hypertension Mother   . Diabetes Sister   . Cancer Brother        Rectal  . Lung cancer Brother       Social History:  reports that she has never smoked. She has never used smokeless tobacco. She reports current alcohol use. She reports that she does not use drugs.  Allergies: No Known Allergies  Medications:                                                                                                                          No current facility-administered medications for this encounter.   Current Outpatient Medications  Medication Sig Dispense Refill  . atenolol (TENORMIN) 50 MG tablet TAKE 1 TABLET(50 MG) BY MOUTH DAILY 90 tablet 0  . diltiazem (CARDIZEM CD) 180 MG 24 hr capsule TAKE 1 CAPSULE(180 MG) BY MOUTH DAILY 90 capsule 2  . lisinopril (ZESTRIL) 5 MG tablet TAKE 1 TABLET(5 MG) BY MOUTH DAILY 90 tablet 0  . metFORMIN (GLUCOPHAGE-XR) 500 MG 24 hr tablet Take 500 mg by mouth 2 (two) times daily.   2  .  warfarin (COUMADIN) 5 MG tablet TAKE 1 TABLET BY MOUTH DAILY EXCEPT TAKE 1 AND 1/2 TABLET ON MONDAY AND FRIDAY 45 tablet 3     ROS:                                                                                                                                       ROS was performed and is negative except as noted in HPI    General Examination:  There were no vitals taken for this visit.  Physical Exam  Constitutional: Appears well-developed and well-nourished.  Psych: appropriate for situation Eyes: Normal external eye and conjunctiva. HENT: Normocephalic, no lesions, without obvious abnormality.   Musculoskeletal-no joint tenderness, deformity or swelling Cardiovascular: Normal rate and regular rhythm.  Respiratory: Effort normal, non-labored breathing saturations WNL GI: Soft.  No distension. There is no tenderness.  Skin: WDI  Neurological Examination Mental Status: Alert, preference to right side, oriented to name, age. Able to follow some simple commands. 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  She is dysarthric.  She has a forced gaze deviation to the right. Motor: She has a severe left hemiparesis with 2/5 strength in left upper extremity and 4/5 strength in left lower extremity. tone and bulk:normal tone throughout; no atrophy noted Sensory: Decreased on the left compared to the right. Plantars: Right: downgoing   Left: downgoing Cerebellar: No ataxia noted Gait: deferred   Lab Results: Basic Metabolic Panel: Recent Labs  Lab 10/31/19 1805  NA 138  K 4.6  CL 103  GLUCOSE 208*  BUN 13  CREATININE 0.50    CBC: Recent Labs  Lab 10/31/19 1805  HGB 14.6  HCT 43.0    CBG: Recent Labs  Lab 10/31/19 1759  GLUCAP 206*    Imaging: CUP PACEART INCLINIC DEVICE  CHECK  Result Date: 10/30/2019 Pacemaker check in clinic. Normal device function. Thresholds, sensing, impedances consistent with previous measurements. Device programmed to maximize longevity. No high ventricular rates noted. Device programmed at appropriate safety margins. Histogram  distribution appropriate for patient activity level. Device programmed to optimize intrinsic conduction. Estimated longevity4 years. Patient enrolled in remote follow-up. Patient education completed. ROV w/ WC.Lavenia Atlas, BSN, RN      Laurey Morale, MSN, NP-C [facility] [phone]  10/31/2019, 6:09 PM   Attending physician note to follow with Assessment and plan .   Assessment: 64 y.o. female with PMH HTN, DM and chronic a fib on coumadin s/p pacemaker who presented to  [facility] ED as a code stroke for right gaze and left side weakness. The [place] did not show a hemorrhage, ASPECTS: 4. NIHSS: 14. CTA did show a right M1 occlusion. CTP: penumbra of 203 and a core of 107. It was discussed with IR about possible thrombectomy given the ASPECTS of 4.  Stroke Risk Factors - diabetes mellitus and hypertension  Though trials have not historically included patient's such as this with a fairly large core, given the size of the ischemic penumbra and potential for salvage of this area, it was considered an option and discussed with the family.  We indicated that the patient would likely be severely impaired and need significant amounts of help, probably even nursing home level best case scenario.  It is possible, however that we could reduce the amount of edema that will be seen and improved outcome in that way by preventing further worsening.  There is a risk of hemorrhage, and this was discussed with the family as well.  After extensive discussion with the husband and daughter, they elected to proceed with the procedure.   CVA -- BP goal :per IR recommendations --Unable to get MRI Brain (unable to obtain d/t  pacemaker not compatible) -- repeat head CT in the morning --Echocardiogram -- Prophylactic therapy-Antiplatelet med -- High intensity Statin if LDL > 70 -- HgbA1c, fasting lipid panel -- PT consult, OT consult,  Speech consult --Telemetry monitoring --Frequent neuro checks --Stroke swallow screen   HTN: Per IR recommendations  DM: SSI  DVT prophylaxis: SCD's GI: doc/senna  Code status: Full code D/c disposition: TBD  This patient is critically ill and at significant risk of neurological worsening, death and care requires constant monitoring of vital signs, hemodynamics,respiratory and cardiac monitoring, neurological assessment, discussion with family, other specialists and medical decision making of high complexity. I spent 60 minutes of neurocritical care time  in the care of  this patient. This was time spent independent of any time provided by nurse practitioner or PA.  Roland Rack, MD [facility] [phone]  If 7pm- 7am, please page neurology on call as listed in [place]. 10/31/2019  7:30 PM

## 2019-10-31 NOTE — Progress Notes (Addendum)
S/O: Patient seen for follow up exam. Discussed case with Dr. Leonel Ramsay in sign out.   BP (!) 143/76   Pulse 71   Ht 5\' 4"  (1.626 m)   Wt 63.6 kg   SpO2 99%   BMI 24.07 kg/m   Ment: Awake but with eyes closed. Decreased level of alertness. Slow, hypophonic speech without errors of syntax or grammar. Oriented to situation and place. Keeps eyes closed. Mild slurring. Able to follow simple motor commands on the right.  CN: PERRL. Left facial droop. Mild dysarthria. Right gaze preference.  Motor: RUE 5/5 RLE can wiggle toes normally (has knee immobilizer) LUE 0.5 LLE 2/5 Sensory: Insensate to FT and pressure on the left.   A/R: -- Status post endovascular clot retrieval for large right MCA stroke -- Exam is stable, with left hemiplegia and sensory deficit. Speech is dysarthric but with no errors of grammar or syntax. Able to follow right sided commands.  -- Continue hypertonic saline.  -- Repeat CT head at 2:30 AM  20 additional minutes of critical care time. Time spent included coordination of care, CT review and repeat examination.   Electronically signed: Dr. Kerney Elbe

## 2019-10-31 NOTE — Procedures (Signed)
INTERVENTIONAL NEURORADIOLOGY BRIEF POSTPROCEDURE NOTE  Krystal Little  Attending: Dr. Pedro Earls  Assistant: None  Diagnosis: Right ICA terminus occlusion  Access site: Right common femoral artery  Access closure: Perclose Proglide  Anesthesia: General  Medication used: refer to anesthesia note for sedation medication.  Complications: None  Estimated blood loss: 150 mL  Specimen: None  Findings: ICA terminus occlusion. Mechanical thrombectomy performed with 2 stent retriever + aspiration passes in the MCA-ICA (solitaire and embotrap), 1 stent retriever + aspiration pass MCA (embotrap) and 1 direct contact aspiration in the right ACA. Complete recanalization achieved (TICI3).  Post procedural flat panel CT shows early cortical and basal ganglia swelling and contrast staining. No subarachnoid hemorrhage.  The patient tolerated the procedure well without incident or complication and was transferred in stable condition to ICU.

## 2019-10-31 NOTE — Transfer of Care (Signed)
Immediate Anesthesia Transfer of Care Note  Patient: Krystal Little  Procedure(s) Performed: IR WITH ANESTHESIA (N/A )  Patient Location: ICU  Anesthesia Type:General  Level of Consciousness: drowsy, patient cooperative and responds to stimulation  Airway & Oxygen Therapy: Patient Spontanous Breathing and Patient connected to face mask oxygen  Post-op Assessment: Report given to RN and Post -op Vital signs reviewed and stable  Post vital signs: Reviewed and stable  Last Vitals:  Vitals Value Taken Time  BP 140/97   Temp    Pulse 64   Resp 15   SpO2 100     Last Pain:  Vitals:   10/31/19 1906  PainSc: 0-No pain     Report to [place] in [place] ICU. Labetalol titrated to maintain SBP goals per Rodrigez MD of 120-140 mmHg. Patient following commands, moving right side only. Questions answered. VSS.    Complications: No apparent anesthesia complications

## 2019-10-31 NOTE — Sedation Documentation (Signed)
Groin and pulses assessed at beside with Rennis Golden, RN, admitting nurse. No change, see flowsheet. All questions answered to satisfaction.

## 2019-11-01 ENCOUNTER — Inpatient Hospital Stay (HOSPITAL_COMMUNITY): Payer: Medicare Other

## 2019-11-01 ENCOUNTER — Inpatient Hospital Stay: Payer: Self-pay

## 2019-11-01 DIAGNOSIS — E1165 Type 2 diabetes mellitus with hyperglycemia: Secondary | ICD-10-CM

## 2019-11-01 DIAGNOSIS — E7800 Pure hypercholesterolemia, unspecified: Secondary | ICD-10-CM

## 2019-11-01 DIAGNOSIS — R791 Abnormal coagulation profile: Secondary | ICD-10-CM

## 2019-11-01 DIAGNOSIS — I161 Hypertensive emergency: Secondary | ICD-10-CM

## 2019-11-01 DIAGNOSIS — R1312 Dysphagia, oropharyngeal phase: Secondary | ICD-10-CM

## 2019-11-01 DIAGNOSIS — I482 Chronic atrial fibrillation, unspecified: Secondary | ICD-10-CM | POA: Diagnosis not present

## 2019-11-01 DIAGNOSIS — I63411 Cerebral infarction due to embolism of right middle cerebral artery: Secondary | ICD-10-CM | POA: Diagnosis not present

## 2019-11-01 LAB — LIPID PANEL
Cholesterol: 239 mg/dL — ABNORMAL HIGH (ref 0–200)
HDL: 69 mg/dL (ref >40)
LDL Cholesterol: 158 mg/dL — ABNORMAL HIGH (ref 0–99)
Total CHOL/HDL Ratio: 3.5 RATIO
Triglycerides: 62 mg/dL (ref <150)
VLDL: 12 mg/dL (ref 0–40)

## 2019-11-01 LAB — CBC
HCT: 41.4 % (ref 36.0–46.0)
Hemoglobin: 14.5 g/dL (ref 12.0–15.0)
MCH: 31.6 pg (ref 26.0–34.0)
MCHC: 35 g/dL (ref 30.0–36.0)
MCV: 90.2 fL (ref 80.0–100.0)
Platelets: 168 10*3/uL (ref 150–400)
RBC: 4.59 MIL/uL (ref 3.87–5.11)
RDW: 12.8 % (ref 11.5–15.5)
WBC: 13.8 10*3/uL — ABNORMAL HIGH (ref 4.0–10.5)
nRBC: 0 % (ref 0.0–0.2)

## 2019-11-01 LAB — BASIC METABOLIC PANEL
Anion gap: 10 (ref 5–15)
BUN: 10 mg/dL (ref 8–23)
CO2: 24 mmol/L (ref 22–32)
Calcium: 9 mg/dL (ref 8.9–10.3)
Chloride: 110 mmol/L (ref 98–111)
Creatinine, Ser: 0.72 mg/dL (ref 0.44–1.00)
GFR calc Af Amer: >60 mL/min (ref >60)
GFR calc non Af Amer: >60 mL/min (ref >60)
Glucose, Bld: 239 mg/dL — ABNORMAL HIGH (ref 70–99)
Potassium: 3.4 mmol/L — ABNORMAL LOW (ref 3.5–5.1)
Sodium: 144 mmol/L (ref 135–145)

## 2019-11-01 LAB — HEMOGLOBIN A1C
Hgb A1c MFr Bld: 9.7 % — ABNORMAL HIGH (ref 4.8–5.6)
Mean Plasma Glucose: 231.69 mg/dL

## 2019-11-01 LAB — GLUCOSE, CAPILLARY
Glucose-Capillary: 238 mg/dL — ABNORMAL HIGH (ref 70–99)
Glucose-Capillary: 195 mg/dL — ABNORMAL HIGH (ref 70–99)
Glucose-Capillary: 197 mg/dL — ABNORMAL HIGH (ref 70–99)
Glucose-Capillary: 148 mg/dL — ABNORMAL HIGH (ref 70–99)
Glucose-Capillary: 150 mg/dL — ABNORMAL HIGH (ref 70–99)
Glucose-Capillary: 146 mg/dL — ABNORMAL HIGH (ref 70–99)

## 2019-11-01 LAB — SODIUM
Sodium: 141 mmol/L (ref 135–145)
Sodium: 143 mmol/L (ref 135–145)
Sodium: 148 mmol/L — ABNORMAL HIGH (ref 135–145)
Sodium: 151 mmol/L — ABNORMAL HIGH (ref 135–145)

## 2019-11-01 LAB — MRSA PCR SCREENING: MRSA by PCR: NEGATIVE

## 2019-11-01 MED ORDER — POTASSIUM CHLORIDE 10 MEQ/100ML IV SOLN
10.0000 meq | INTRAVENOUS | Status: AC
  Administered 2019-11-01 (×3): 10 meq via INTRAVENOUS
  Filled 2019-11-01 (×2): qty 100

## 2019-11-01 MED ORDER — DILTIAZEM HCL 60 MG PO TABS: 60.0000 mg | ORAL_TABLET | Freq: Three times a day (TID) | ORAL | Status: DC

## 2019-11-01 MED ORDER — ONDANSETRON HCL 4 MG/2ML IJ SOLN
4.0000 mg | Freq: Four times a day (QID) | INTRAMUSCULAR | Status: DC | PRN
  Administered 2019-11-01: 07:00:00 4 mg via INTRAVENOUS

## 2019-11-01 MED ORDER — SODIUM CHLORIDE 23.4 % INJECTION (4 MEQ/ML) FOR IV ADMINISTRATION
120.0000 meq | Freq: Once | INTRAVENOUS | Status: AC
  Administered 2019-11-01: 120 meq via INTRAVENOUS
  Filled 2019-11-01: qty 30

## 2019-11-01 MED ORDER — PANTOPRAZOLE SODIUM 40 MG IV SOLR
40.0000 mg | INTRAVENOUS | Status: DC
  Administered 2019-11-01 – 2019-11-03 (×2): 40 mg via INTRAVENOUS
  Filled 2019-11-01 (×2): qty 40

## 2019-11-01 MED ORDER — SODIUM CHLORIDE 0.9% FLUSH: 10.0000 mL | INTRAVENOUS | Status: DC | PRN

## 2019-11-01 MED ORDER — ORAL CARE MOUTH RINSE: 15.0000 mL | Freq: Two times a day (BID) | OROMUCOSAL | Status: DC

## 2019-11-01 MED ORDER — POTASSIUM CHLORIDE 10 MEQ/100ML IV SOLN
10.0000 meq | INTRAVENOUS | Status: AC
  Administered 2019-11-01: 10 meq via INTRAVENOUS
  Filled 2019-11-01 (×2): qty 100

## 2019-11-01 MED ORDER — SENNOSIDES-DOCUSATE SODIUM 8.6-50 MG PO TABS
1.0000 | ORAL_TABLET | Freq: Every evening | ORAL | Status: DC | PRN
  Filled 2019-11-01: qty 1

## 2019-11-01 MED ORDER — ONDANSETRON HCL 4 MG/2ML IJ SOLN
4.0000 mg | Freq: Four times a day (QID) | INTRAMUSCULAR | Status: DC
  Filled 2019-11-01: qty 2

## 2019-11-01 MED ORDER — ATENOLOL 50 MG PO TABS: 50.0000 mg | ORAL_TABLET | Freq: Every day | ORAL | Status: DC

## 2019-11-01 MED ORDER — PROMETHAZINE HCL 25 MG/ML IJ SOLN: 12.5000 mg | INTRAMUSCULAR | Status: DC | PRN

## 2019-11-01 MED ORDER — CHLORHEXIDINE GLUCONATE 0.12 % MT SOLN
15.0000 mL | Freq: Two times a day (BID) | OROMUCOSAL | Status: DC
  Administered 2019-11-01: 22:00:00 15 mL via OROMUCOSAL
  Filled 2019-11-01: qty 15

## 2019-11-01 MED ORDER — SODIUM CHLORIDE 0.9% FLUSH
10.0000 mL | Freq: Two times a day (BID) | INTRAVENOUS | Status: DC
  Administered 2019-11-03: 20 mL
  Administered 2019-11-03: 10 mL
  Administered 2019-11-04: 20 mL
  Administered 2019-11-04: 10 mL
  Administered 2019-11-05: 30 mL
  Administered 2019-11-06 – 2019-11-08 (×7): 10 mL
  Administered 2019-11-09: 20 mL
  Administered 2019-11-10: 10 mL
  Administered 2019-11-10: 20 mL
  Administered 2019-11-11 (×2): 10 mL
  Administered 2019-11-12: 30 mL
  Administered 2019-11-12: 10 mL
  Administered 2019-11-13: 30 mL
  Administered 2019-11-13 – 2019-11-15 (×4): 10 mL
  Administered 2019-11-15: 30 mL
  Administered 2019-11-16: 10 mL
  Administered 2019-11-16 – 2019-11-17 (×2): 20 mL
  Administered 2019-11-17 – 2019-11-20 (×6): 10 mL

## 2019-11-01 MED ORDER — DILTIAZEM HCL 60 MG PO TABS
60.0000 mg | ORAL_TABLET | Freq: Three times a day (TID) | ORAL | Status: DC
  Filled 2019-11-01 (×3): qty 1

## 2019-11-01 NOTE — Consult Note (Signed)
Chief Complaint   Chief Complaint  Patient presents with  . Code Stroke    History of Present Illness  Krystal Little is a 64 y.o. female with a medical history significant for diabetes and hypertension who presented yesterday with left hemiplegia and neglect.  She was found to harbor MCA occlusion with relatively large area of stroke and surrounding penumbra.  She therefore did elect to undergo endovascular thrombectomy with revascularization.  Initial and follow-up CT scans were indicative of relatively large right-sided MCA territory stroke with surrounding edema.  Neurosurgery was therefore consulted for possibility of decompressive hemicraniectomy.  Past Medical History   Past Medical History:  Diagnosis Date  . Chronic atrial fibrillation ([place]) 1990s   s/p DCCV then attempted ablation of complex A Flutter (@ [facility] - Dr. Deno Etienne), failed antiarrhythmics --> INR folllwed @ [facility] FP ->> now status post pacemaker placement with underlying A. fib.  . Diabetes mellitus type 2, uncontrolled, without complications    On Oral Medications ([place] [facility])  . Essential hypertension   . History of cardiac catheterization 2001   R&LHC - normal Coronaries, no evidence of Restictive Cardiomyopathy or Constrictive Pericarditis (also @ [facility])  . Hx of sick sinus syndrome 07/1999   Wtih symptomatic bradycardia - syncope (Tachy-Brady)  . 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 S/P placement of cardiac pacemaker 07/1999    Past Surgical History   Past Surgical History:  Procedure Laterality Date  . CARDIAC CATHETERIZATION  08/03/1999   [place] - normal Coronaries; no sign of  constriction or restrictive cardiomypathy  . IR PERCUTANEOUS ART THROMBECTOMY/INFUSION INTRACRANIAL INC DIAG ANGIO  10/31/2019      . MITRAL VALVE REPAIR  07/1999   Both Ant& Post leaflet repair - quadrangular resection&caudal transposition, #32 Sequin Annuloplasty ring  . PACEMAKER GENERATOR CHANGE  11/26/2008   Medtronic Adapta L(? if it has been checked since 02/2013)  . PACEMAKER INSERTION  07/1999   for SSS in setting of Chronic Afib; Medtronic Kappa [id number], [id number] H.  . TRANSTHORACIC ECHOCARDIOGRAM  05/2018   Normal LV size and thickness.  EF estimated 50%.  Inferobasal HK and flattened septum c/w with elevated PA pressures. Mild functional mitral stenosis at rest (postoperative).  Moderate left atrial dilation and mild right atrial dilation.  Mean PA pressures estimated 52 mmHg.    Social History   Social History   Tobacco Use  . Smoking status: Never Smoker  . Smokeless tobacco: Never Used  Substance Use Topics  . Alcohol use: Yes    Alcohol/week: 0.0 standard drinks    Comment: occasional, once every month or two- glass of wine   . Drug use: No    Medications   Prior to Admission medications   Medication Sig Start Date End Date Taking? Authorizing Provider  atenolol (TENORMIN) 50 MG tablet TAKE 1 TABLET(50 MG) BY MOUTH DAILY Patient taking differently: Take by mouth daily.  09/26/19   Leonie Man, MD  diltiazem [facility] CD) 180 MG 24 hr capsule TAKE 1 CAPSULE(180 MG) BY MOUTH DAILY 09/26/19   Leonie Man, MD  lisinopril (ZESTRIL) 5 MG tablet TAKE 1 TABLET(5 MG) BY MOUTH DAILY 08/26/19  Leonie Man, MD  metFORMIN (GLUCOPHAGE-XR) 500 MG 24 hr tablet Take 500 mg by mouth 2 (two) times daily.  01/25/18   [provider]  warfarin (COUMADIN) 5 MG tablet TAKE 1 TABLET BY MOUTH DAILY EXCEPT TAKE 1 AND 1/2 TABLET ON MONDAY AND FRIDAY 08/05/18   Bufford Lope, DO    Allergies  No Known Allergies  Review of Systems  ROS  Neurologic Exam  Easily arouses,  answers simple questions, oriented to name, place, year Left facial droop Good strength RUE/RLE No voluntary movement LUE/LLE   Imaging  CTH this am reviewed, does demonstrate right MCA territory infarct with surrounding edema. There is effacement of the right lateral ventricle and ~52mm R->L MLS. Basal cisterns appear open, no midbrain compression. No HCP.  Impression  - 64 y.o. female presenting with right MCA territory infarct and associated edema. At present, it appears her neurologic exam is somewhat stable without overt clinical signs of brainstem compression. I would therefore cont to treat medically with hypertonic saline, goal Na 150-155. Would plan on operative decompression if her neurologic exam deteriorates.  Plan  - Can increase HTS to 100cc/hr - Cont to monitor neurologic exam

## 2019-11-01 NOTE — Progress Notes (Signed)
Referring Physician(s): Code Stroke- Greta Doom  Supervising Physician: Pedro Earls  Patient Status:  [facility] - In-pt  Chief Complaint: None- lethargic  Subjective:  History of acute CVA s/p cerebral arteriogram with emergent mechanical thrombectomy of right ICA terminus occlusion, right MCA occlusion, and right ACA occlusion achieving a TICI 3 revascularization 10/31/2019 by Dr. Karenann Cai. Patient laying in bed resting comfortably. Appears lethargic- responds with voice to painful stimuli but quickly falls back asleep. Right gaze preference. Can spontaneously move right side, LLE withdraws from pain, no movements of LUE. Right groin incision c/d/i.   Allergies: Patient has no known allergies.  Medications: Prior to Admission medications   Medication Sig Start Date End Date Taking? Authorizing Provider  atenolol (TENORMIN) 50 MG tablet TAKE 1 TABLET(50 MG) BY MOUTH DAILY Patient taking differently: Take 50 mg by mouth daily.  09/26/19   Leonie Man, MD  diltiazem (CARDIZEM CD) 180 MG 24 hr capsule TAKE 1 CAPSULE(180 MG) BY MOUTH DAILY Patient taking differently: Take 180 mg by mouth daily.  09/26/19   Leonie Man, MD  lisinopril (ZESTRIL) 5 MG tablet TAKE 1 TABLET(5 MG) BY MOUTH DAILY Patient taking differently: Take 5 mg by mouth daily.  08/26/19   Leonie Man, MD  metFORMIN (GLUCOPHAGE) 1000 MG tablet Take 1,000 mg by mouth 2 (two) times daily. 09/12/19   [provider]  metFORMIN (GLUCOPHAGE-XR) 500 MG 24 hr tablet Take 500 mg by mouth 2 (two) times daily.  01/25/18   [provider]  warfarin (COUMADIN) 5 MG tablet TAKE 1 TABLET BY MOUTH DAILY EXCEPT TAKE 1 AND 1/2 TABLET ON MONDAY AND FRIDAY Patient taking differently: Take 5 mg by mouth See admin instructions. TAKE 1 TABLET BY MOUTH DAILY EXCEPT TAKE 1 AND 1/2 TABLET ON MONDAY AND FRIDAY 08/05/18   Bufford Lope, DO     Vital Signs: BP 105/74   Pulse 92    Temp 100 F (37.8 C) (Oral)   Resp 18   Ht 5\' 4"  (1.626 m)   Wt 140 lb 3.4 oz (63.6 kg)   SpO2 97%   BMI 24.07 kg/m   Physical Exam Constitutional:      General: She is not in acute distress.    Comments: Lethargic but responds to painful stimuli with voice but quickly falls back asleep.  Pulmonary:     Effort: Pulmonary effort is normal. No respiratory distress.  Skin:    General: Skin is warm and dry.     Comments: Right groin incision soft without active bleeding or hematoma.  Neurological:     Comments: Lethargic but responds to painful stimuli with voice but quickly falls back asleep. Speech clear. PERRL bilaterally. Right gaze preference, not able to cross midline. Can spontaneously move right side, LLE withdraws from pain, no movements of LUE. Distal pulses (DPs) 2+ bilaterally.     Imaging: CT Code Stroke CTA Head W/WO contrast  Result Date: 10/31/2019 CLINICAL DATA:  Last seen normal 5 hours ago. New onset left-sided weakness. Left facial droop and gaze. EXAM: CT ANGIOGRAPHY HEAD AND NECK CT PERFUSION BRAIN TECHNIQUE: Multidetector CT imaging of the head and neck was performed using the standard protocol during bolus administration of intravenous contrast. Multiplanar CT image reconstructions and MIPs were obtained to evaluate the vascular anatomy. Carotid stenosis measurements (when applicable) are obtained utilizing NASCET criteria, using the distal internal carotid diameter as the denominator. Multiphase CT imaging of the brain was performed  following IV bolus contrast injection. Subsequent parametric perfusion maps were calculated using RAPID software. CONTRAST:  124mL OMNIPAQUE IOHEXOL 350 MG/ML SOLN COMPARISON:  CT head without contrast 10/31/2019. FINDINGS: CTA NECK FINDINGS Aortic arch: A 4 vessel arch configuration is present. Left vertebral artery originates directly from the arch. No significant atherosclerotic calcification or stenosis is present. Right carotid  system: The right common carotid artery is within normal limits. Atherosclerotic changes are present at the proximal right ICA without a significant stenosis. Cervical right ICA is otherwise. Left carotid system: The left common carotid artery is within normal limits. Minimal atherosclerotic changes are present without significant stenosis. Cervical left ICA is otherwise normal. Vertebral arteries: Right vertebral artery originates from the subclavian artery without significant stenosis. It is the dominant vessel. No significant stenosis is present in either vertebral artery in the neck. Skeleton: Mild degenerative changes are present at C5-6. Uncovertebral spurring is present the right C4-5. No focal lytic or blastic lesions are present. Other neck: The soft tissues the neck are otherwise unremarkable. Upper chest: The lung apices are clear. Thoracic inlet is within normal limits. Review of the MIP images confirms the above findings CTA HEAD FINDINGS Anterior circulation: The right internal carotid artery is occluded at the terminus. Clot extends beyond the right MCA bifurcation. Anterior branches opacify. Poor collateralization is evident posteriorly. The left internal carotid artery is within normal limits through the terminus. Left A1 and M1 segments are normal. Anterior communicating artery is patent. Both ACA vessels fill from the left. Retrograde right A1 flow does not reach the ICA terminus secondary to the T occlusion. Left MCA bifurcation and branch vessels are within normal limits. Posterior circulation: The right vertebral artery is dominant vessel. PICA origins are visualized and within normal limits. Basilar artery is normal. The left posterior cerebral artery originates from the basilar tip. The right posterior cerebral artery is of fetal type is fed from just below the thrombus. PCA branch vessels are within normal limits bilaterally. Venous sinuses: The dural sinuses are patent. Straight sinus deep  cerebral veins are intact. Cortical veins are within normal limits. No vascular malformation is present. Anatomic variants: Fetal type right posterior cerebral artery. Review of the MIP images confirms the above findings CT Brain Perfusion Findings: ASPECTS: 4/10 CBF (<30%) Volume: 157mL Perfusion (Tmax>6.0s) volume: 22mL Mismatch Volume: 47mL Infarction Location:Right MCA territory IMPRESSION: 1. Confirmed large right MCA territory infarct with core volume of 107 mL. 2. Large mismatch volume of 9.6 with a mismatch ratio of 1.9. 3. T occlusion of right ICA terminus. 4. Thrombus extends through the right MCA bifurcation with poor collaterals. 5. Minimal atherosclerotic changes at the carotid bifurcations bilaterally without significant stenosis. 6. No significant intracranial disease on the left or in the posterior circulation. 7. Fetal type right posterior cerebral artery originates just below the terminal ICA thrombus. Electronically Signed   By: San Morelle M.D.   On: 10/31/2019 18:17   CT HEAD WO CONTRAST  Result Date: 11/01/2019 CLINICAL DATA:  Stroke follow-up EXAM: CT HEAD WITHOUT CONTRAST TECHNIQUE: Contiguous axial images were obtained from the base of the skull through the vertex without intravenous contrast. COMPARISON:  CT perfusion scan 10/31/2019 FINDINGS: Brain: Large area of hypoattenuation within the right MCA territory. The right lateral ventricle is compressed. There is leftward midline shift that measures 6 mm at the level of the foramina of [place]. Multifocal hyperdensity within the right MCA territory is consistent with hemorrhage or contrast staining. The more peripheral hyperdensities are favored  to be hemorrhagic. The largest of these measures 6 mm. Vascular: No hyperdense vessel or unexpected calcification. Skull: Normal. Negative for fracture or focal lesion. Sinuses/Orbits: No acute finding. Other: None. IMPRESSION: 1. Large area of hypoattenuation within the right MCA  territory, consistent with subacute infarct. 2. Multifocal hyperdensity within the right MCA territory is consistent with hemorrhage or contrast staining. The more peripheral hyperdensities are favored to be hemorrhagic. 3. 6 mm leftward midline shift at the level of the foramina of [place]. Electronically Signed   By: Ulyses Jarred M.D.   On: 11/01/2019 02:45   CT Code Stroke CTA Neck W/WO contrast  Result Date: 10/31/2019 CLINICAL DATA:  Last seen normal 5 hours ago. New onset left-sided weakness. Left facial droop and gaze. EXAM: CT ANGIOGRAPHY HEAD AND NECK CT PERFUSION BRAIN TECHNIQUE: Multidetector CT imaging of the head and neck was performed using the standard protocol during bolus administration of intravenous contrast. Multiplanar CT image reconstructions and MIPs were obtained to evaluate the vascular anatomy. Carotid stenosis measurements (when applicable) are obtained utilizing NASCET criteria, using the distal internal carotid diameter as the denominator. Multiphase CT imaging of the brain was performed following IV bolus contrast injection. Subsequent parametric perfusion maps were calculated using RAPID software. CONTRAST:  139mL OMNIPAQUE IOHEXOL 350 MG/ML SOLN COMPARISON:  CT head without contrast 10/31/2019. FINDINGS: CTA NECK FINDINGS Aortic arch: A 4 vessel arch configuration is present. Left vertebral artery originates directly from the arch. No significant atherosclerotic calcification or stenosis is present. Right carotid system: The right common carotid artery is within normal limits. Atherosclerotic changes are present at the proximal right ICA without a significant stenosis. Cervical right ICA is otherwise. Left carotid system: The left common carotid artery is within normal limits. Minimal atherosclerotic changes are present without significant stenosis. Cervical left ICA is otherwise normal. Vertebral arteries: Right vertebral artery originates from the subclavian artery without  significant stenosis. It is the dominant vessel. No significant stenosis is present in either vertebral artery in the neck. Skeleton: Mild degenerative changes are present at C5-6. Uncovertebral spurring is present the right C4-5. No focal lytic or blastic lesions are present. Other neck: The soft tissues the neck are otherwise unremarkable. Upper chest: The lung apices are clear. Thoracic inlet is within normal limits. Review of the MIP images confirms the above findings CTA HEAD FINDINGS Anterior circulation: The right internal carotid artery is occluded at the terminus. Clot extends beyond the right MCA bifurcation. Anterior branches opacify. Poor collateralization is evident posteriorly. The left internal carotid artery is within normal limits through the terminus. Left A1 and M1 segments are normal. Anterior communicating artery is patent. Both ACA vessels fill from the left. Retrograde right A1 flow does not reach the ICA terminus secondary to the T occlusion. Left MCA bifurcation and branch vessels are within normal limits. Posterior circulation: The right vertebral artery is dominant vessel. PICA origins are visualized and within normal limits. Basilar artery is normal. The left posterior cerebral artery originates from the basilar tip. The right posterior cerebral artery is of fetal type is fed from just below the thrombus. PCA branch vessels are within normal limits bilaterally. Venous sinuses: The dural sinuses are patent. Straight sinus deep cerebral veins are intact. Cortical veins are within normal limits. No vascular malformation is present. Anatomic variants: Fetal type right posterior cerebral artery. Review of the MIP images confirms the above findings CT Brain Perfusion Findings: ASPECTS: 4/10 CBF (<30%) Volume: 162mL Perfusion (Tmax>6.0s) volume: 243mL Mismatch Volume: 76mL  Infarction Location:Right MCA territory IMPRESSION: 1. Confirmed large right MCA territory infarct with core volume of 107  mL. 2. Large mismatch volume of 9.6 with a mismatch ratio of 1.9. 3. T occlusion of right ICA terminus. 4. Thrombus extends through the right MCA bifurcation with poor collaterals. 5. Minimal atherosclerotic changes at the carotid bifurcations bilaterally without significant stenosis. 6. No significant intracranial disease on the left or in the posterior circulation. 7. Fetal type right posterior cerebral artery originates just below the terminal ICA thrombus. Electronically Signed   By: San Morelle M.D.   On: 10/31/2019 18:17   CT Code Stroke Cerebral Perfusion with contrast  Result Date: 10/31/2019 CLINICAL DATA:  Last seen normal 5 hours ago. New onset left-sided weakness. Left facial droop and gaze. EXAM: CT ANGIOGRAPHY HEAD AND NECK CT PERFUSION BRAIN TECHNIQUE: Multidetector CT imaging of the head and neck was performed using the standard protocol during bolus administration of intravenous contrast. Multiplanar CT image reconstructions and MIPs were obtained to evaluate the vascular anatomy. Carotid stenosis measurements (when applicable) are obtained utilizing NASCET criteria, using the distal internal carotid diameter as the denominator. Multiphase CT imaging of the brain was performed following IV bolus contrast injection. Subsequent parametric perfusion maps were calculated using RAPID software. CONTRAST:  137mL OMNIPAQUE IOHEXOL 350 MG/ML SOLN COMPARISON:  CT head without contrast 10/31/2019. FINDINGS: CTA NECK FINDINGS Aortic arch: A 4 vessel arch configuration is present. Left vertebral artery originates directly from the arch. No significant atherosclerotic calcification or stenosis is present. Right carotid system: The right common carotid artery is within normal limits. Atherosclerotic changes are present at the proximal right ICA without a significant stenosis. Cervical right ICA is otherwise. Left carotid system: The left common carotid artery is within normal limits. Minimal  atherosclerotic changes are present without significant stenosis. Cervical left ICA is otherwise normal. Vertebral arteries: Right vertebral artery originates from the subclavian artery without significant stenosis. It is the dominant vessel. No significant stenosis is present in either vertebral artery in the neck. Skeleton: Mild degenerative changes are present at C5-6. Uncovertebral spurring is present the right C4-5. No focal lytic or blastic lesions are present. Other neck: The soft tissues the neck are otherwise unremarkable. Upper chest: The lung apices are clear. Thoracic inlet is within normal limits. Review of the MIP images confirms the above findings CTA HEAD FINDINGS Anterior circulation: The right internal carotid artery is occluded at the terminus. Clot extends beyond the right MCA bifurcation. Anterior branches opacify. Poor collateralization is evident posteriorly. The left internal carotid artery is within normal limits through the terminus. Left A1 and M1 segments are normal. Anterior communicating artery is patent. Both ACA vessels fill from the left. Retrograde right A1 flow does not reach the ICA terminus secondary to the T occlusion. Left MCA bifurcation and branch vessels are within normal limits. Posterior circulation: The right vertebral artery is dominant vessel. PICA origins are visualized and within normal limits. Basilar artery is normal. The left posterior cerebral artery originates from the basilar tip. The right posterior cerebral artery is of fetal type is fed from just below the thrombus. PCA branch vessels are within normal limits bilaterally. Venous sinuses: The dural sinuses are patent. Straight sinus deep cerebral veins are intact. Cortical veins are within normal limits. No vascular malformation is present. Anatomic variants: Fetal type right posterior cerebral artery. Review of the MIP images confirms the above findings CT Brain Perfusion Findings: ASPECTS: 4/10 CBF (<30%)  Volume: 117mL Perfusion (Tmax>6.0s) volume:  293mL Mismatch Volume: 23mL Infarction Location:Right MCA territory IMPRESSION: 1. Confirmed large right MCA territory infarct with core volume of 107 mL. 2. Large mismatch volume of 9.6 with a mismatch ratio of 1.9. 3. T occlusion of right ICA terminus. 4. Thrombus extends through the right MCA bifurcation with poor collaterals. 5. Minimal atherosclerotic changes at the carotid bifurcations bilaterally without significant stenosis. 6. No significant intracranial disease on the left or in the posterior circulation. 7. Fetal type right posterior cerebral artery originates just below the terminal ICA thrombus. Electronically Signed   By: San Morelle M.D.   On: 10/31/2019 18:17   DG Chest Port 1 View  Result Date: 10/31/2019 CLINICAL DATA:  Stroke. EXAM: PORTABLE CHEST 1 VIEW COMPARISON:  11/26/2008 FINDINGS: Right-sided pacemaker in place. Post median sternotomy. Mild cardiomegaly. No pulmonary edema, focal airspace disease, pleural effusion or pneumothorax. Bones appear under mineralized. IMPRESSION: Mild cardiomegaly. No acute chest findings. Electronically Signed   By: Keith Rake M.D.   On: 10/31/2019 21:54   CUP PACEART INCLINIC DEVICE CHECK  Result Date: 10/30/2019 Pacemaker check in clinic. Normal device function. Thresholds, sensing, impedances consistent with previous measurements. Device programmed to maximize longevity. No high ventricular rates noted. Device programmed at appropriate safety margins. Histogram  distribution appropriate for patient activity level. Device programmed to optimize intrinsic conduction. Estimated longevity4 years. Patient enrolled in remote follow-up. Patient education completed. ROV w/ WC.Lavenia Atlas, BSN, RN  CT HEAD CODE STROKE WO CONTRAST  Result Date: 10/31/2019 CLINICAL DATA:  Code stroke. Acute onset of left-sided facial droop and abnormal gaze. EXAM: CT HEAD WITHOUT CONTRAST TECHNIQUE: Contiguous  axial images were obtained from the base of the skull through the vertex without intravenous contrast. COMPARISON:  None. FINDINGS: Brain: A large right MCA infarct is noted. There is hypoattenuation involving the right caudate head, internal capsule, and lentiform nucleus. Hypoattenuation is seen at the insular ribbon, right superior temporal gyrus, and super ganglionic posterior right frontal lobe. No acute hemorrhage is present. No mass effect scratched at there is some partial effacement of the sulci. No midline shift is present. Basal ganglia are normal on the left. The brainstem and cerebellum are within normal limits. The ventricles are of normal size. No significant extraaxial fluid collection is present. Vascular: A hyperdense right MCA and ICA terminus is noted. Hyperdensity extends beyond the right MCA bifurcation. The left MCA is unremarkable. Atherosclerotic calcifications are present within the cavernous internal carotid arteries bilaterally. Skull: 1 Calvarium is intact. No focal lytic or blastic lesions are present. No significant extracranial soft tissue lesion is present. Sinuses/Orbits: The paranasal sinuses and mastoid air cells are clear. The globes and orbits are within normal limits. ASPECTS [facility] Stroke Program Early CT Score) - Ganglionic level infarction (caudate, lentiform nuclei, internal capsule, insula, M1-M3 cortex): 2/7 - Supraganglionic infarction (M4-M6 cortex): 2/3 Total score (0-10 with 10 being normal): 4/10 IMPRESSION: 1. Large right MCA territory nonhemorrhagic infarct is described. 2. Hyperdense right MCA suggesting extensive thrombus from the right ICA terminus through the MCA bifurcation 3. Some mass effect with partial effacement the sulci on the right. No midline shift. 4. ASPECTS is 4/10 The above was relayed via text pager to Dr. Roland Rack on 10/31/2019 at 17:59 . Electronically Signed   By: San Morelle M.D.   On: 10/31/2019 18:03     Labs:  CBC: Recent Labs    08/18/19 1120 10/31/19 1744 10/31/19 1805 11/01/19 0945  WBC 4.3 5.7  --  13.8*  HGB 15.2  14.3 14.6 14.5  HCT 45.9 43.1 43.0 41.4  PLT 168 151  --  168    COAGS: Recent Labs    08/18/19 1101 10/31/19 1744  INR 1.9* 1.2  APTT  --  27    BMP: Recent Labs    08/18/19 1120 10/31/19 1744 10/31/19 1744 10/31/19 1805 10/31/19 1805 10/31/19 2120 11/01/19 0348 11/01/19 0756 11/01/19 0945  NA 140 136   < > 138   < > 138 141 143 144  K 3.8 4.6  --  4.6  --   --   --   --  3.4*  CL 100 102  --  103  --   --   --   --  110  CO2 24 24  --   --   --   --   --   --  24  GLUCOSE 306* 214*  --  208*  --   --   --   --  239*  BUN 12 11  --  13  --   --   --   --  10  CALCIUM 9.2 9.0  --   --   --   --   --   --  9.0  CREATININE 0.79 0.69  --  0.50  --   --   --   --  0.72  GFRNONAA 79 >60  --   --   --   --   --   --  >60  GFRAA 91 >60  --   --   --   --   --   --  >60   < > = values in this interval not displayed.    LIVER FUNCTION TESTS: Recent Labs    08/18/19 1120 10/31/19 1744  BILITOT 0.6 1.1  AST 26 25  ALT 31 20  ALKPHOS 135* 92  PROT 7.1 7.4  ALBUMIN 4.4 4.1    Assessment and Plan:  History of acute CVA s/p cerebral arteriogram with emergent mechanical thrombectomy of right ICA terminus occlusion, right MCA occlusion, and right ACA occlusion achieving a TICI 3 revascularization 10/31/2019 by Dr. Karenann Cai. Patient's condition stable- appears lethargic, responds to painful stimuli but quickly falls back asleep, can spontaneously move right side, LLE withdraws from pain, no movements of LUE, right gaze preference. Right groin incision stable, distal pulses (DPs) 2+ bilaterally. Further plans per neurology/neurosurgery- appreciate and agree with management. NIR to follow.   Electronically Signed: Earley Abide, PA-C 11/01/2019, 10:46 AM   I spent a total of 25 Minutes at the the patient's bedside AND on the  patient's hospital floor or unit, greater than 50% of which was counseling/coordinating care for CVA s/p revascularization.

## 2019-11-01 NOTE — Progress Notes (Signed)
**Note Krystal-Identified via Obfuscation** STROKE TEAM PROGRESS NOTE   INTERVAL HISTORY Her nephew and husband are at the bedside.  Pt lying in bed, very lethargic, barely open eyes with voice. However, she is still orientated to time, place and people. Able to following commands on the right but flaccid on LUE and mild withdraw to pain on the LLE. CT repeat showed 10mm MLS and significant cerebral edema with mild HT. On 3% saline. NSG on board to consider hemicrani.    OBJECTIVE Vitals:   11/01/19 0430 11/01/19 0500 11/01/19 0525 11/01/19 0530  BP: 130/70 (!) 146/78 124/67 140/70  Pulse: 88 95 (!) 110 97  Resp: 18 (!) 22 19 16   Temp:      TempSrc:      SpO2: 96% 98% 97% 95%  Weight:      Height:        CBC:  Recent Labs  Lab 10/31/19 1744 10/31/19 1805  WBC 5.7  --   NEUTROABS 4.1  --   HGB 14.3 14.6  HCT 43.1 43.0  MCV 91.3  --   PLT 151  --     Basic Metabolic Panel:  Recent Labs  Lab 10/31/19 1744 10/31/19 1744 10/31/19 1805 10/31/19 1805 10/31/19 2120 11/01/19 0348  NA 136   < > 138   < > 138 141  K 4.6  --  4.6  --   --   --   CL 102  --  103  --   --   --   CO2 24  --   --   --   --   --   GLUCOSE 214*  --  208*  --   --   --   BUN 11  --  13  --   --   --   CREATININE 0.69  --  0.50  --   --   --   CALCIUM 9.0  --   --   --   --   --    < > = values in this interval not displayed.    Lipid Panel:     Component Value Date/Time   CHOL 239 (H) 11/01/2019 0348   CHOL 212 (H) 07/15/2018 0907   TRIG 62 11/01/2019 0348   HDL 69 11/01/2019 0348   HDL 50 07/15/2018 0907   CHOLHDL 3.5 11/01/2019 0348   VLDL 12 11/01/2019 0348   LDLCALC 158 (H) 11/01/2019 0348   LDLCALC 138 (H) 07/15/2018 0907   HgbA1c:  Lab Results  Component Value Date   HGBA1C 9.7 (H) 11/01/2019   Urine Drug Screen:     Component Value Date/Time   LABOPIA NONE DETECTED 10/31/2019 1820   COCAINSCRNUR NONE DETECTED 10/31/2019 1820   LABBENZ NONE DETECTED 10/31/2019 1820   AMPHETMU NONE DETECTED 10/31/2019 1820   THCU  NONE DETECTED 10/31/2019 1820   LABBARB NONE DETECTED 10/31/2019 1820    Alcohol Level     Component Value Date/Time   ETH <10 10/31/2019 1744    IMAGING  CT Code Stroke CTA Head W/WO contrast CT Code Stroke CTA Neck W/WO contrast CT Code Stroke Cerebral Perfusion with contrast 10/31/2019 IMPRESSION:  1. Confirmed large right MCA territory infarct with core volume of 107 mL.  2. Large mismatch volume of 9.6 with a mismatch ratio of 1.9.  3. T occlusion of right ICA terminus.  4. Thrombus extends through the right MCA bifurcation with poor collaterals.  5. Minimal atherosclerotic changes at the carotid bifurcations bilaterally without significant stenosis.  6. No significant intracranial disease on the left or in the posterior circulation.  7. Fetal type right posterior cerebral artery originates just below the terminal ICA thrombus.   CT HEAD WO CONTRAST 11/01/2019 IMPRESSION:  1. Large area of hypoattenuation within the right MCA territory, consistent with subacute infarct.  2. Multifocal hyperdensity within the right MCA territory is consistent with hemorrhage or contrast staining. The more peripheral hyperdensities are favored to be hemorrhagic.  3. 6 mm leftward midline shift at the level of the foramina of [place].   CT HEAD CODE STROKE WO CONTRAST 10/31/2019 IMPRESSION:  1. Large right MCA territory nonhemorrhagic infarct is described.  2. Hyperdense right MCA suggesting extensive thrombus from the right ICA terminus through the MCA bifurcation  3. Some mass effect with partial effacement the sulci on the right. No midline shift.  4. ASPECTS is 4/10   DG Chest Port 1 View 10/31/2019 IMPRESSION:  Mild cardiomegaly. No acute chest findings.    CUP PACEART INCLINIC DEVICE CHECK  10/30/2019 Pacemaker check in clinic. Normal device function. Thresholds, sensing, impedances consistent with previous measurements. Device programmed to maximize longevity. No high ventricular rates  noted. Device programmed at appropriate safety margins. Histogram  distribution appropriate for patient activity level. Device programmed to optimize intrinsic conduction. Estimated longevity4 years. Patient enrolled in remote follow-up. Patient education completed. ROV w/ WC.[facility], BSN, [facility]  Interventional Neuro Radiology - Cerebral Angiogram with Intervention - Krystal Rosario Jacks, MD 10/31/2019 8:20 PM ICA terminus occlusion. Mechanical thrombectomy performed with 2 stent retriever + aspiration passes in the MCA-ICA (solitaire and embotrap), 1 stent retriever + aspiration pass MCA (embotrap) and 1 direct contact aspiration in the right ACA. Complete recanalization achieved (TICI3).  Transthoracic Echocardiogram  Pending   PHYSICAL EXAM  Temp:  [98.2 F (36.8 C)-100 F (37.8 C)] 100 F (37.8 C) (04/17 0800) Pulse Rate:  [64-122] 89 (04/17 1345) Resp:  [12-30] 20 (04/17 1345) BP: (87-159)/(42-107) 119/61 (04/17 1345) SpO2:  [95 %-100 %] 97 % (04/17 1345) Weight:  [63.6 kg] 63.6 kg (04/16 1907)  General - Well nourished, well developed, lethargic and barely open eyes.  Ophthalmologic - fundi not visualized due to noncooperation.  Cardiovascular - irregularly irregular heart rate and rhythm.  Neuro - lethargic and barely open eyes on voice, not able to maintain eye opening without repetitive stimulation. Orientated to time, place and people. Moderate dysarthria. Able to name and repeat simple sentence but not very cooperative due to lethargy. Right gaze preference, not able to cross midline, not blinking to visual threat on the left. R pupil 2.52mm and left pupil 54mm, both reactive to light. Positive corneal. Left facial droop. Tongue protrusion not cooperative. LUE flaccid, 0/5. LLE mild withdraw to pain, not against gravity. RUE at least 4/5 and RLE at least 3/5. Sensation subjectively symmetrical. bilateral positive for babinski. Coordination not cooperative. Gait not  tested.    ASSESSMENT/PLAN [name] is a 64 y.o. female with history of HTN, DM2, hx of mitral valve repair, chronic a. Fib (coumadin - INR 1.2 on admission) s/p pace maker who presented to [facility] as a code stroke for right gaze deviation, urinary incontinence and left sided weakness. She did not receive IV t-PA due to anticoagulation with coumadin. IR -> ICA terminus occlusion. Mechanical thrombectomy performed with 2 stent retriever + aspiration passes in the MCA-ICA (solitaire and embotrap), 1 stent retriever + aspiration pass MCA (embotrap) and 1 direct contact aspiration in the right ACA. Complete  recanalization achieved (TICI3).   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 Hyperdense right MCA suggesting extensive thrombus from the right ICA terminus through the MCA bifurcation Some mass effect with partial effacement the sulci on the right. No midline shift. ASPECTS is 4/10   CTA H&N - Total occlusion of right ICA terminus. Thrombus extends through the right MCA bifurcation with poor collaterals.   CT Perfusion - Confirmed large right MCA territory infarct with core volume of 107 mL. Large mismatch volume of 9.6 with a mismatch ratio of 1.9.   CT head -  Large area of hypoattenuation within the right MCA territory, consistent with subacute infarct. Multifocal hyperdensity within the right MCA territory is consistent with hemorrhage or contrast staining. The more peripheral hyperdensities are favored to be hemorrhagic. 6 mm leftward midline shift  MRI head - not performed - PPM -  2D Echo - pending  Sars Corona Virus 2 - negative  LDL - 158  HgbA1c - 9.7  UDS - negative  VTE prophylaxis - SCDs  warfarin daily prior to admission, now on No  antithrombotic  Patient will be counseled to be compliant with her antithrombotic medications  Ongoing aggressive stroke risk factor management  Therapy recommendations:  pending  Disposition:  Pending  Cerebral edema  CT repeat showed 6 mm Midline Shift  3% saline @ 75 cc/hr  Na 138->141->148  NSG on board - hemicrani if mental status worse  Sodium goal 150-160  Sodium every 6 hours  PICC line requested  Consider 23.4% saline once access of central line  Atrial fibrillation  Telemetry showed A. fib, rate controlled  On Coumadin PTA, family stated compliance  Subtherapeutic INR 1.2 on presentation  No AC at the time due to large infarct and hemorrhagic conversion  Hypertension  Home BP meds: Atenolol; Cardizem ; lisinopril  Current BP meds: Cleviprex  Stable . SBP goal < 160 mm Hg . Consider to resume home meds once p.o. access . Long-term BP goal normotensive  Hyperlipidemia  Home Lipid lowering medication: none  LDL 158, goal < 70  Consider statin once p.o. access  Continue statin at discharge  Diabetes  Home diabetic meds: metformin  Current diabetic meds: SSI   HgbA1c 9.7, goal < 7.0  CBG monitoring  Dysphagia  N.p.o. for now  Hold off NG tube placement for now given potential hemicrai procedure  IV fluid  Other Stroke Risk Factors  Advanced age  ETOH use, advised to drink no more than 1 alcoholic beverage per day.  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 fib, dysphagia and at significant risk of neurological worsening, death form brain herniation, cerebral edema, recurrent stroke, hematoma expansion, heart failure, aspiration pneumonia, shock. This patient's care requires constant monitoring of vital signs,  hemodynamics, respiratory and cardiac monitoring, review of multiple databases, neurological assessment, discussion with family, other specialists and medical decision making of high complexity. I spent 50 minutes of neurocritical care time in the care of this patient. I had long discussion with husband and nephew at bedside, updated pt current condition, treatment plan and potential  prognosis, and answered all the questions.  They expressed understanding and appreciation.   Krystal Hawking, MD PhD Stroke Neurology 11/01/2019 6:44 PM    To contact Stroke Continuity provider, please refer to [email or web address] After hours, contact General Neurology

## 2019-11-01 NOTE — Progress Notes (Signed)
PT Cancellation Note  Patient Details Name: Krystal [facility] MRN: [id number] DOB: 02-03-1956   Cancelled Treatment:    Reason Eval/Treat Not Completed: Medical issues which prohibited therapy(pt with edema and mass effect with potential for crani with RN declining mobility at this time with request to hold therapy initiation until 4/19)   Jimy Gates B Sadeel Fiddler 11/01/2019, 9:17 AM  Bayard Males, PT Acute Rehabilitation Services Pager: [phone] Office: [phone]

## 2019-11-01 NOTE — Procedures (Signed)
Echo attempted. Patient moving too much to attempt echo. Will attempt again later.

## 2019-11-01 NOTE — Progress Notes (Signed)
Peripherally Inserted Central Catheter Placement  The IV Nurse has discussed with the patient and/or persons authorized to consent for the patient, the purpose of this procedure and the potential benefits and risks involved with this procedure.  The benefits include less needle sticks, lab draws from the catheter, and the patient may be discharged home with the catheter. Risks include, but not limited to, infection, bleeding, blood clot (thrombus formation), and puncture of an artery; nerve damage and irregular heartbeat and possibility to perform a PICC exchange if needed/ordered by physician.  Alternatives to this procedure were also discussed.  Bard Power PICC patient education guide, fact sheet on infection prevention and patient information card has been provided to patient /or left at bedside.    PICC Placement Documentation  PICC Triple Lumen 11/01/19 PICC Left Brachial 38 cm 0 cm (Active)  Indication for Insertion or Continuance of Line Vasoactive infusions 11/01/19 1700  Exposed Catheter (cm) 0 cm 11/01/19 1700  Site Assessment Clean;Dry;Intact 11/01/19 1700  Lumen #1 Status Flushed;Blood return noted;Saline locked 11/01/19 1700  Lumen #2 Status Flushed;Blood return noted;Saline locked 11/01/19 1700  Lumen #3 Status Flushed;Blood return noted;Saline locked 11/01/19 1700  Dressing Type Transparent 11/01/19 1700  Dressing Status Clean;Dry;Intact;Antimicrobial disc in place 11/01/19 [place] checked and tightened 11/01/19 1700  Dressing Change Due 11/08/19 11/01/19 1700       Lorenza Cambridge 11/01/2019, 5:28 PM

## 2019-11-01 NOTE — Progress Notes (Signed)
Placed TL PICC. Patient is in atrial fibrillation, so PCXR was used to confirm placement. Xray revealed that PICC is up in the right IJ. Attempt was made to power flush the tip down into recommended position. Patient sat up with two RN, leaned forward, and this author attempted commands to take a deep breath and hold, as I power flushed all three ports simultaneously.  PCXR ordered to follow up and evaluate intervention.

## 2019-11-01 NOTE — Progress Notes (Signed)
CT head completed:  1. Large area of hypoattenuation within the right MCA territory, consistent with subacute infarct. 2. Multifocal hyperdensity within the right MCA territory is consistent with hemorrhage or contrast staining. The more peripheral hyperdensities are favored to be hemorrhagic. 3. 6 mm leftward midline shift at the level of the foramina of [place].  A/R: 64 year old female s/p mechanical thrombectomy for large right MCA stroke with severe edema -- Worsening mass effect on follow up CT this AM -- Na at 0348 was 141 -- Increasing hypertonic saline infusion rate to 75 cc/hr -- Neurosurgery to evaluate for possible hemicraniectomy. Discussed with Dr. Kathyrn Sheriff  Electronically signed: Dr. Kerney Elbe

## 2019-11-01 NOTE — Progress Notes (Signed)
SLP Cancellation Note  Patient Details Name: Krystal [facility] MRN: [id number] DOB: 1955/11/14   Cancelled treatment:       Reason Eval/Treat Not Completed: Medical issues which prohibited therapy. Orders received for swallowing and cognitive-linguistic evaluations. Per RN report pt is very lethargic and there is ongoing concern for need to go to the OR. Will hold our evaluations at this time.     Osie Bond., M.A. [place] [facility] Pager [phone] Office [phone]  11/01/2019, 11:10 AM

## 2019-11-01 NOTE — Progress Notes (Signed)
Follow up [place] shows PICC terminating at the cavoatrial junction after power flushing maneuver.

## 2019-11-02 ENCOUNTER — Encounter (HOSPITAL_COMMUNITY)
Admission: EM | Disposition: A | Discharge status: Rehabilitation Facility | Payer: Self-pay | Source: Home / Self Care | Attending: Neurology

## 2019-11-02 ENCOUNTER — Inpatient Hospital Stay (HOSPITAL_COMMUNITY): Payer: Medicare Other

## 2019-11-02 ENCOUNTER — Inpatient Hospital Stay (HOSPITAL_COMMUNITY): Payer: Medicare Other | Admitting: Anesthesiology

## 2019-11-02 DIAGNOSIS — J9600 Acute respiratory failure, unspecified whether with hypoxia or hypercapnia: Secondary | ICD-10-CM | POA: Diagnosis not present

## 2019-11-02 DIAGNOSIS — I4891 Unspecified atrial fibrillation: Secondary | ICD-10-CM

## 2019-11-02 DIAGNOSIS — I6389 Other cerebral infarction: Secondary | ICD-10-CM | POA: Diagnosis not present

## 2019-11-02 DIAGNOSIS — E876 Hypokalemia: Secondary | ICD-10-CM | POA: Diagnosis not present

## 2019-11-02 DIAGNOSIS — I361 Nonrheumatic tricuspid (valve) insufficiency: Secondary | ICD-10-CM

## 2019-11-02 DIAGNOSIS — J9601 Acute respiratory failure with hypoxia: Secondary | ICD-10-CM

## 2019-11-02 DIAGNOSIS — I482 Chronic atrial fibrillation, unspecified: Secondary | ICD-10-CM | POA: Diagnosis not present

## 2019-11-02 DIAGNOSIS — I63411 Cerebral infarction due to embolism of right middle cerebral artery: Secondary | ICD-10-CM | POA: Diagnosis not present

## 2019-11-02 DIAGNOSIS — G936 Cerebral edema: Secondary | ICD-10-CM | POA: Diagnosis not present

## 2019-11-02 DIAGNOSIS — I10 Essential (primary) hypertension: Secondary | ICD-10-CM

## 2019-11-02 DIAGNOSIS — E1159 Type 2 diabetes mellitus with other circulatory complications: Secondary | ICD-10-CM

## 2019-11-02 HISTORY — PX: CRANIECTOMY FOR DEPRESSED SKULL FRACTURE: SHX5788

## 2019-11-02 LAB — POCT I-STAT 7, (LYTES, BLD GAS, ICA,H+H)
Acid-base deficit: 1 mmol/L (ref 0.0–2.0)
Acid-base deficit: 2 mmol/L (ref 0.0–2.0)
Bicarbonate: 23.4 mmol/L (ref 20.0–28.0)
Bicarbonate: 22.9 mmol/L (ref 20.0–28.0)
Calcium, Ion: 1.13 mmol/L — ABNORMAL LOW (ref 1.15–1.40)
Calcium, Ion: 1.22 mmol/L (ref 1.15–1.40)
HCT: 32 % — ABNORMAL LOW (ref 36.0–46.0)
HCT: 33 % — ABNORMAL LOW (ref 36.0–46.0)
Hemoglobin: 10.9 g/dL — ABNORMAL LOW (ref 12.0–15.0)
Hemoglobin: 11.2 g/dL — ABNORMAL LOW (ref 12.0–15.0)
O2 Saturation: 100 %
O2 Saturation: 100 %
Patient temperature: 36.3
Patient temperature: 98.2
Potassium: 2.8 mmol/L — ABNORMAL LOW (ref 3.5–5.1)
Potassium: 3.7 mmol/L (ref 3.5–5.1)
Sodium: 152 mmol/L — ABNORMAL HIGH (ref 135–145)
Sodium: 151 mmol/L — ABNORMAL HIGH (ref 135–145)
TCO2: 24 mmol/L (ref 22–32)
TCO2: 24 mmol/L (ref 22–32)
pCO2 arterial: 34.1 mmHg (ref 32.0–48.0)
pCO2 arterial: 39.1 mmHg (ref 32.0–48.0)
pH, Arterial: 7.442 (ref 7.350–7.450)
pH, Arterial: 7.375 (ref 7.350–7.450)
pO2, Arterial: 339 mmHg — ABNORMAL HIGH (ref 83.0–108.0)
pO2, Arterial: 286 mmHg — ABNORMAL HIGH (ref 83.0–108.0)

## 2019-11-02 LAB — PROTIME-INR
INR: 1.4 — ABNORMAL HIGH (ref 0.8–1.2)
Prothrombin Time: 17.4 seconds — ABNORMAL HIGH (ref 11.4–15.2)

## 2019-11-02 LAB — SODIUM
Sodium: 174 mmol/L (ref 135–145)
Sodium: 149 mmol/L — ABNORMAL HIGH (ref 135–145)
Sodium: 155 mmol/L — ABNORMAL HIGH (ref 135–145)
Sodium: 157 mmol/L — ABNORMAL HIGH (ref 135–145)

## 2019-11-02 LAB — CBC
HCT: 36.7 % (ref 36.0–46.0)
Hemoglobin: 12.5 g/dL (ref 12.0–15.0)
MCH: 31.5 pg (ref 26.0–34.0)
MCHC: 34.1 g/dL (ref 30.0–36.0)
MCV: 92.4 fL (ref 80.0–100.0)
Platelets: 134 10*3/uL — ABNORMAL LOW (ref 150–400)
RBC: 3.97 MIL/uL (ref 3.87–5.11)
RDW: 13.4 % (ref 11.5–15.5)
WBC: 14 10*3/uL — ABNORMAL HIGH (ref 4.0–10.5)
nRBC: 0 % (ref 0.0–0.2)

## 2019-11-02 LAB — BASIC METABOLIC PANEL
Anion gap: 8 (ref 5–15)
BUN: 7 mg/dL — ABNORMAL LOW (ref 8–23)
CO2: 24 mmol/L (ref 22–32)
Calcium: 8.5 mg/dL — ABNORMAL LOW (ref 8.9–10.3)
Chloride: 115 mmol/L — ABNORMAL HIGH (ref 98–111)
Creatinine, Ser: 0.58 mg/dL (ref 0.44–1.00)
GFR calc Af Amer: >60 mL/min (ref >60)
GFR calc non Af Amer: >60 mL/min (ref >60)
Glucose, Bld: 173 mg/dL — ABNORMAL HIGH (ref 70–99)
Potassium: 3.2 mmol/L — ABNORMAL LOW (ref 3.5–5.1)
Sodium: 147 mmol/L — ABNORMAL HIGH (ref 135–145)

## 2019-11-02 LAB — GLUCOSE, CAPILLARY
Glucose-Capillary: 154 mg/dL — ABNORMAL HIGH (ref 70–99)
Glucose-Capillary: 189 mg/dL — ABNORMAL HIGH (ref 70–99)
Glucose-Capillary: 145 mg/dL — ABNORMAL HIGH (ref 70–99)
Glucose-Capillary: 114 mg/dL — ABNORMAL HIGH (ref 70–99)
Glucose-Capillary: 177 mg/dL — ABNORMAL HIGH (ref 70–99)

## 2019-11-02 LAB — ECHOCARDIOGRAM COMPLETE
Height: 64 in
Weight: 2243.4 oz

## 2019-11-02 LAB — MAGNESIUM: Magnesium: 1.9 mg/dL (ref 1.7–2.4)

## 2019-11-02 LAB — TRIGLYCERIDES: Triglycerides: 206 mg/dL — ABNORMAL HIGH (ref <150)

## 2019-11-02 SURGERY — CRANIECTOMY FOR DEPRESSED SKULL FRACTURE
Anesthesia: General | Site: Head | Laterality: Right

## 2019-11-02 MED ORDER — 0.9 % SODIUM CHLORIDE (POUR BTL) OPTIME
TOPICAL | Status: DC | PRN
  Administered 2019-11-02: 1000 mL

## 2019-11-02 MED ORDER — BUPIVACAINE HCL (PF) 0.5 % IJ SOLN
INTRAMUSCULAR | Status: AC
  Filled 2019-11-02: qty 30

## 2019-11-02 MED ORDER — ORAL CARE MOUTH RINSE
15.0000 mL | OROMUCOSAL | Status: DC
  Administered 2019-11-02 – 2019-11-03 (×5): 15 mL via OROMUCOSAL

## 2019-11-02 MED ORDER — POTASSIUM CHLORIDE 10 MEQ/100ML IV SOLN
10.0000 meq | INTRAVENOUS | Status: AC
  Administered 2019-11-02 (×4): 10 meq via INTRAVENOUS
  Filled 2019-11-02 (×4): qty 100

## 2019-11-02 MED ORDER — CHLORHEXIDINE GLUCONATE 0.12% ORAL RINSE (MEDLINE KIT)
15.0000 mL | Freq: Two times a day (BID) | OROMUCOSAL | Status: DC
  Administered 2019-11-02 (×2): 15 mL via OROMUCOSAL

## 2019-11-02 MED ORDER — DEXAMETHASONE SODIUM PHOSPHATE 10 MG/ML IJ SOLN
INTRAMUSCULAR | Status: DC | PRN
  Administered 2019-11-02: 5 mg via INTRAVENOUS

## 2019-11-02 MED ORDER — THROMBIN 20000 UNITS EX SOLR
CUTANEOUS | Status: DC | PRN
  Administered 2019-11-02: 20 mL via TOPICAL

## 2019-11-02 MED ORDER — FENTANYL 2500MCG IN NS 250ML (10MCG/ML) PREMIX INFUSION
0.0000 ug/h | INTRAVENOUS | Status: DC
  Administered 2019-11-02: 50 ug/h via INTRAVENOUS
  Administered 2019-11-05: 25 ug/h via INTRAVENOUS
  Filled 2019-11-02 (×2): qty 250

## 2019-11-02 MED ORDER — PROPOFOL 10 MG/ML IV BOLUS
INTRAVENOUS | Status: DC | PRN
  Administered 2019-11-02: 50 mg via INTRAVENOUS
  Administered 2019-11-02: 80 mg via INTRAVENOUS

## 2019-11-02 MED ORDER — FENTANYL CITRATE (PF) 250 MCG/5ML IJ SOLN
INTRAMUSCULAR | Status: AC
  Filled 2019-11-02: qty 5

## 2019-11-02 MED ORDER — SODIUM CHLORIDE 0.9 % IV SOLN
INTRAVENOUS | Status: DC | PRN
  Administered 2019-11-02: 500 mL

## 2019-11-02 MED ORDER — PROPOFOL 10 MG/ML IV BOLUS
INTRAVENOUS | Status: AC
  Filled 2019-11-02: qty 20

## 2019-11-02 MED ORDER — ARTIFICIAL TEARS OPHTHALMIC OINT
TOPICAL_OINTMENT | OPHTHALMIC | Status: DC | PRN
  Administered 2019-11-02: 1 via OPHTHALMIC

## 2019-11-02 MED ORDER — METOPROLOL TARTRATE 25 MG/10 ML ORAL SUSPENSION
25.0000 mg | Freq: Two times a day (BID) | ORAL | Status: DC
  Administered 2019-11-02 – 2019-11-03 (×3): 25 mg
  Filled 2019-11-02 (×3): qty 10

## 2019-11-02 MED ORDER — SODIUM CHLORIDE 0.9 % IV SOLN: INTRAVENOUS | Status: DC | PRN

## 2019-11-02 MED ORDER — CEFAZOLIN SODIUM-DEXTROSE 2-3 GM-%(50ML) IV SOLR
INTRAVENOUS | Status: DC | PRN
  Administered 2019-11-02: 2 g via INTRAVENOUS

## 2019-11-02 MED ORDER — LIDOCAINE 2% (20 MG/ML) 5 ML SYRINGE
INTRAMUSCULAR | Status: DC | PRN
  Administered 2019-11-02: 60 mg via INTRAVENOUS

## 2019-11-02 MED ORDER — ROCURONIUM BROMIDE 50 MG/5ML IV SOSY
PREFILLED_SYRINGE | INTRAVENOUS | Status: DC | PRN
  Administered 2019-11-02: 20 mg via INTRAVENOUS
  Administered 2019-11-02: 70 mg via INTRAVENOUS

## 2019-11-02 MED ORDER — THROMBIN 5000 UNITS EX SOLR
OROMUCOSAL | Status: DC | PRN
  Administered 2019-11-02: 5 mL via TOPICAL

## 2019-11-02 MED ORDER — PROPOFOL 1000 MG/100ML IV EMUL
INTRAVENOUS | Status: AC
  Filled 2019-11-02: qty 100

## 2019-11-02 MED ORDER — LIDOCAINE-EPINEPHRINE 1 %-1:100000 IJ SOLN
INTRAMUSCULAR | Status: AC
  Filled 2019-11-02: qty 1

## 2019-11-02 MED ORDER — SODIUM CHLORIDE 23.4 % INJECTION (4 MEQ/ML) FOR IV ADMINISTRATION
120.0000 meq | Freq: Once | INTRAVENOUS | Status: AC
  Administered 2019-11-02: 12:00:00 120 meq via INTRAVENOUS
  Filled 2019-11-02: qty 30

## 2019-11-02 MED ORDER — FENTANYL CITRATE (PF) 100 MCG/2ML IJ SOLN
INTRAMUSCULAR | Status: DC | PRN
  Administered 2019-11-02 (×2): 50 ug via INTRAVENOUS
  Administered 2019-11-02: 100 ug via INTRAVENOUS
  Administered 2019-11-02: 50 ug via INTRAVENOUS

## 2019-11-02 MED ORDER — ONDANSETRON HCL 4 MG/2ML IJ SOLN
INTRAMUSCULAR | Status: DC | PRN
  Administered 2019-11-02: 4 mg via INTRAVENOUS

## 2019-11-02 MED ORDER — PROPOFOL 500 MG/50ML IV EMUL
INTRAVENOUS | Status: DC | PRN
  Administered 2019-11-02: 35 ug/kg/min via INTRAVENOUS

## 2019-11-02 MED ORDER — THROMBIN 5000 UNITS EX SOLR
CUTANEOUS | Status: AC
  Filled 2019-11-02: qty 5000

## 2019-11-02 MED ORDER — BUPIVACAINE HCL (PF) 0.5 % IJ SOLN
INTRAMUSCULAR | Status: DC | PRN
  Administered 2019-11-02: 6 mL

## 2019-11-02 MED ORDER — SODIUM CHLORIDE 3 % IV SOLN
INTRAVENOUS | Status: DC
  Administered 2019-11-02: 22:00:00 50 mL/h via INTRAVENOUS
  Filled 2019-11-02 (×4): qty 500

## 2019-11-02 MED ORDER — THROMBIN 20000 UNITS EX SOLR
CUTANEOUS | Status: AC
  Filled 2019-11-02: qty 20000

## 2019-11-02 MED ORDER — LIDOCAINE-EPINEPHRINE 1 %-1:100000 IJ SOLN
INTRAMUSCULAR | Status: DC | PRN
  Administered 2019-11-02: 6 mL via INTRADERMAL

## 2019-11-02 MED ORDER — SODIUM CHLORIDE 0.9 % IV SOLN: INTRAVENOUS | Status: DC

## 2019-11-02 MED ORDER — PHENYLEPHRINE HCL-NACL 10-0.9 MG/250ML-% IV SOLN
INTRAVENOUS | Status: DC | PRN
  Administered 2019-11-02: 50 ug/min via INTRAVENOUS

## 2019-11-02 MED ORDER — ORAL CARE MOUTH RINSE
15.0000 mL | OROMUCOSAL | Status: DC
  Administered 2019-11-02 – 2019-11-20 (×155): 15 mL via OROMUCOSAL

## 2019-11-02 MED ORDER — BACITRACIN ZINC 500 UNIT/GM EX OINT
TOPICAL_OINTMENT | CUTANEOUS | Status: DC | PRN
  Administered 2019-11-02: 1 via TOPICAL

## 2019-11-02 MED ORDER — CHLORHEXIDINE GLUCONATE 0.12% ORAL RINSE (MEDLINE KIT)
15.0000 mL | Freq: Two times a day (BID) | OROMUCOSAL | Status: DC
  Administered 2019-11-03 – 2019-11-20 (×34): 15 mL via OROMUCOSAL

## 2019-11-02 MED ORDER — BACITRACIN ZINC 500 UNIT/GM EX OINT
TOPICAL_OINTMENT | CUTANEOUS | Status: AC
  Filled 2019-11-02: qty 28.35

## 2019-11-02 MED ORDER — PROPOFOL 1000 MG/100ML IV EMUL
5.0000 ug/kg/min | INTRAVENOUS | Status: DC
  Administered 2019-11-02: 17:00:00 10 ug/kg/min via INTRAVENOUS
  Filled 2019-11-02: qty 100

## 2019-11-02 SURGICAL SUPPLY — 71 items
APL SKNCLS STERI-STRIP NONHPOA (GAUZE/BANDAGES/DRESSINGS)
BENZOIN TINCTURE PRP APPL 2/3 (GAUZE/BANDAGES/DRESSINGS) IMPLANT
BLADE CLIPPER SURG (BLADE) ×2 IMPLANT
BLADE ULTRA TIP 2M (BLADE) ×2 IMPLANT
BNDG GAUZE ELAST 4 BULKY (GAUZE/BANDAGES/DRESSINGS) IMPLANT
BUR ACORN 6.0 PRECISION (BURR) ×2 IMPLANT
BUR MATCHSTICK NEURO 3.0 LAGG (BURR) IMPLANT
BUR SPIRAL ROUTER 2.3 (BUR) ×1 IMPLANT
CANISTER SUCT 3000ML PPV (MISCELLANEOUS) ×3 IMPLANT
CARTRIDGE OIL MAESTRO DRILL (MISCELLANEOUS) ×1 IMPLANT
CLIP VESOCCLUDE MED 6/CT (CLIP) IMPLANT
COVER WAND RF STERILE (DRAPES) ×2 IMPLANT
DIFFUSER DRILL AIR PNEUMATIC (MISCELLANEOUS) ×2 IMPLANT
DRAPE INCISE IOBAN 66X45 STRL (DRAPES) ×1 IMPLANT
DRAPE NEUROLOGICAL W/INCISE (DRAPES) ×2 IMPLANT
DRAPE SURG 17X23 STRL (DRAPES) IMPLANT
DRAPE WARM FLUID 44X44 (DRAPES) ×2 IMPLANT
DRSG OPSITE POSTOP 4X6 (GAUZE/BANDAGES/DRESSINGS) ×1 IMPLANT
DRSG TELFA 3X8 NADH (GAUZE/BANDAGES/DRESSINGS) ×2 IMPLANT
DURAPREP 6ML APPLICATOR 50/CS (WOUND CARE) ×2 IMPLANT
ELECT REM PT RETURN 9FT ADLT (ELECTROSURGICAL) ×2
ELECTRODE REM PT RTRN 9FT ADLT (ELECTROSURGICAL) ×1 IMPLANT
EVACUATOR 1/8 PVC DRAIN (DRAIN) IMPLANT
EVACUATOR SILICONE 100CC (DRAIN) IMPLANT
GAUZE 4X4 16PLY RFD (DISPOSABLE) IMPLANT
GAUZE SPONGE 4X4 12PLY STRL (GAUZE/BANDAGES/DRESSINGS) ×2 IMPLANT
GLOVE BIO SURGEON STRL SZ7 (GLOVE) ×2 IMPLANT
GLOVE BIO SURGEON STRL SZ7.5 (GLOVE) ×1 IMPLANT
GLOVE BIOGEL PI IND STRL 7.5 (GLOVE) ×1 IMPLANT
GLOVE BIOGEL PI INDICATOR 7.5 (GLOVE) ×4
GLOVE ECLIPSE 7.0 STRL STRAW (GLOVE) ×5 IMPLANT
GLOVE EXAM NITRILE XL STR (GLOVE) IMPLANT
GOWN STRL REUS W/ TWL LRG LVL3 (GOWN DISPOSABLE) ×2 IMPLANT
GOWN STRL REUS W/ TWL XL LVL3 (GOWN DISPOSABLE) IMPLANT
GOWN STRL REUS W/TWL 2XL LVL3 (GOWN DISPOSABLE) IMPLANT
GOWN STRL REUS W/TWL LRG LVL3 (GOWN DISPOSABLE) ×8
GOWN STRL REUS W/TWL XL LVL3 (GOWN DISPOSABLE)
GRAFT DURAGEN MATRIX 5WX7L (Graft) ×1 IMPLANT
HEMOSTAT POWDER KIT SURGIFOAM (HEMOSTASIS) ×2 IMPLANT
HEMOSTAT SURGICEL 2X14 (HEMOSTASIS) IMPLANT
KIT BASIN OR (CUSTOM PROCEDURE TRAY) ×2 IMPLANT
KIT TURNOVER KIT B (KITS) ×2 IMPLANT
NDL HYPO 25X1 1.5 SAFETY (NEEDLE) ×1 IMPLANT
NEEDLE HYPO 25X1 1.5 SAFETY (NEEDLE) ×2 IMPLANT
NS IRRIG 1000ML POUR BTL (IV SOLUTION) ×2 IMPLANT
OIL CARTRIDGE MAESTRO DRILL (MISCELLANEOUS) ×2
PACK CRANIOTOMY CUSTOM (CUSTOM PROCEDURE TRAY) ×2 IMPLANT
PAD DRESSING TELFA 3X8 NADH (GAUZE/BANDAGES/DRESSINGS) IMPLANT
PATTIES SURGICAL .5 X.5 (GAUZE/BANDAGES/DRESSINGS) IMPLANT
PATTIES SURGICAL .5 X3 (DISPOSABLE) IMPLANT
PATTIES SURGICAL 1X1 (DISPOSABLE) IMPLANT
SPONGE NEURO XRAY DETECT 1X3 (DISPOSABLE) IMPLANT
SPONGE SURGIFOAM ABS GEL 100 (HEMOSTASIS) ×2 IMPLANT
STAPLER VISISTAT 35W (STAPLE) ×2 IMPLANT
STOCKINETTE 6  STRL (DRAPES)
STOCKINETTE 6 STRL (DRAPES) ×1 IMPLANT
SUT ETHILON 3 0 FSL (SUTURE) IMPLANT
SUT ETHILON 3 0 PS 1 (SUTURE) IMPLANT
SUT NURALON 4 0 TR CR/8 (SUTURE) ×6 IMPLANT
SUT STEEL 0 (SUTURE)
SUT STEEL 0 18XMFL TIE 17 (SUTURE) IMPLANT
SUT VIC AB 0 CT1 18XCR BRD8 (SUTURE) ×2 IMPLANT
SUT VIC AB 0 CT1 8-18 (SUTURE) ×4
SUT VIC AB 3-0 SH 8-18 (SUTURE) ×4 IMPLANT
SYR CONTROL 10ML LL (SYRINGE) ×1 IMPLANT
TOWEL GREEN STERILE (TOWEL DISPOSABLE) ×3 IMPLANT
TOWEL GREEN STERILE FF (TOWEL DISPOSABLE) ×2 IMPLANT
TRAY FOLEY MTR SLVR 16FR STAT (SET/KITS/TRAYS/PACK) ×1 IMPLANT
TUBE CONNECTING 12X1/4 (SUCTIONS) ×2 IMPLANT
UNDERPAD 30X30 (UNDERPADS AND DIAPERS) ×2 IMPLANT
WATER STERILE IRR 1000ML POUR (IV SOLUTION) ×2 IMPLANT

## 2019-11-02 NOTE — Anesthesia Procedure Notes (Signed)
Procedure Name: Intubation Date/Time: 11/02/2019 7:52 AM Performed by: Neldon Newport, CRNA Pre-anesthesia Checklist: Timeout performed, Patient being monitored, Suction available, Emergency Drugs available and Patient identified Patient Re-evaluated:Patient Re-evaluated prior to induction Oxygen Delivery Method: Circle system utilized Preoxygenation: Pre-oxygenation with 100% oxygen Induction Type: IV induction Ventilation: Mask ventilation without difficulty Laryngoscope Size: Mac and 3 Grade View: Grade I Tube type: Oral Tube size: 7.0 mm Number of attempts: 1 Placement Confirmation: ETT inserted through vocal cords under direct vision,  positive ETCO2 and breath sounds checked- equal and bilateral Secured at: 21 cm Tube secured with: Tape Dental Injury: Teeth and Oropharynx as per pre-operative assessment

## 2019-11-02 NOTE — Progress Notes (Addendum)
STROKE TEAM PROGRESS NOTE   INTERVAL HISTORY Her husband is at the bedside.  Pt just back from OR after [facility], still intubated on sedation. Her HR 100-140, will put on OG and start home meds with atenolol and cardizem. Pt able to move RUE on pain and slight withdraw BLEs.   OBJECTIVE Vitals:   11/02/19 0545 11/02/19 0600 11/02/19 0615 11/02/19 0630  BP: 128/71  137/60 139/65  Pulse: 87 91 89 85  Resp: (!) 21 (!) 22 20 16   Temp:      TempSrc:      SpO2: 98% 98% 98% 98%  Weight:      Height:        CBC:  Recent Labs  Lab 10/31/19 1744 10/31/19 1805 11/01/19 0945 11/02/19 0243  WBC 5.7   < > 13.8* 14.0*  NEUTROABS 4.1  --   --   --   HGB 14.3   < > 14.5 12.5  HCT 43.1   < > 41.4 36.7  MCV 91.3   < > 90.2 92.4  PLT 151   < > 168 134*   < > = values in this interval not displayed.    Basic Metabolic Panel:  Recent Labs  Lab 11/01/19 0945 11/01/19 1515 11/02/19 0243 11/02/19 0343  NA 144   < > 174* 147*  K 3.4*  --   --  3.2*  CL 110  --   --  115*  CO2 24  --   --  24  GLUCOSE 239*  --   --  173*  BUN 10  --   --  7*  CREATININE 0.72  --   --  0.58  CALCIUM 9.0  --   --  8.5*  MG  --   --  1.9  --    < > = values in this interval not displayed.    Lipid Panel:     Component Value Date/Time   CHOL 239 (H) 11/01/2019 0348   CHOL 212 (H) 07/15/2018 0907   TRIG 206 (H) 11/02/2019 0243   HDL 69 11/01/2019 0348   HDL 50 07/15/2018 0907   CHOLHDL 3.5 11/01/2019 0348   VLDL 12 11/01/2019 0348   LDLCALC 158 (H) 11/01/2019 0348   LDLCALC 138 (H) 07/15/2018 0907   HgbA1c:  Lab Results  Component Value Date   HGBA1C 9.7 (H) 11/01/2019   Urine Drug Screen:     Component Value Date/Time   LABOPIA NONE DETECTED 10/31/2019 1820   COCAINSCRNUR NONE DETECTED 10/31/2019 1820   LABBENZ NONE DETECTED 10/31/2019 1820   AMPHETMU NONE DETECTED 10/31/2019 1820   THCU NONE DETECTED 10/31/2019 1820   LABBARB NONE DETECTED 10/31/2019 1820    Alcohol Level     Component  Value Date/Time   ETH <10 10/31/2019 1744    IMAGING  CT Code Stroke CTA Head W/WO contrast CT Code Stroke CTA Neck W/WO contrast CT Code Stroke Cerebral Perfusion with contrast 10/31/2019 IMPRESSION:  1. Confirmed large right MCA territory infarct with core volume of 107 mL.  2. Large mismatch volume of 9.6 with a mismatch ratio of 1.9.  3. T occlusion of right ICA terminus.  4. Thrombus extends through the right MCA bifurcation with poor collaterals.  5. Minimal atherosclerotic changes at the carotid bifurcations bilaterally without significant stenosis.  6. No significant intracranial disease on the left or in the posterior circulation.  7. Fetal type right posterior cerebral artery originates just below the terminal ICA thrombus.  CT HEAD WO CONTRAST 11/01/2019 IMPRESSION:  1. Large area of hypoattenuation within the right MCA territory, consistent with subacute infarct.  2. Multifocal hyperdensity within the right MCA territory is consistent with hemorrhage or contrast staining. The more peripheral hyperdensities are favored to be hemorrhagic.  3. 6 mm leftward midline shift at the level of the foramina of Monro.   CT HEAD CODE STROKE WO CONTRAST 10/31/2019 IMPRESSION:  1. Large right MCA territory nonhemorrhagic infarct is described.  2. Hyperdense right MCA suggesting extensive thrombus from the right ICA terminus through the MCA bifurcation  3. Some mass effect with partial effacement the sulci on the right. No midline shift.  4. ASPECTS is 4/10   CT Head WO Contrast 11/02/2019 3:30 AM IMPRESSION: 1. Worsened edema within the right MCA territory with increased size of right basal ganglia intraparenchymal hematoma. 2. Increased amount of subarachnoid blood over the right hemisphere. 3. 11 mm of leftward midline shift at the level of the foramen of Monro with early herniation of the right cingulate gyrus beneath the falx cerebri  DG Chest [facility] 1  View 10/31/2019 IMPRESSION:  Mild cardiomegaly. No acute chest findings.    CUP PACEART INCLINIC DEVICE CHECK  10/30/2019 Pacemaker check in clinic. Normal device function. Thresholds, sensing, impedances consistent with previous measurements. Device programmed to maximize longevity. No high ventricular rates noted. Device programmed at appropriate safety margins. Histogram  distribution appropriate for patient activity level. Device programmed to optimize intrinsic conduction. Estimated longevity4 years. Patient enrolled in remote follow-up. Patient education completed. ROV w/ WC.Lavenia Atlas, BSN, RN  Interventional Neuro Radiology - Cerebral Angiogram with Intervention - de Rosario Jacks, MD 10/31/2019 8:20 PM ICA terminus occlusion. Mechanical thrombectomy performed with 2 stent retriever + aspiration passes in the MCA-ICA (solitaire and embotrap), 1 stent retriever + aspiration pass MCA (embotrap) and 1 direct contact aspiration in the right ACA. Complete recanalization achieved (TICI3).  Transthoracic Echocardiogram  Pending   PHYSICAL EXAM  Temp:  [98 F (36.7 C)-100.8 F (38.2 C)] 98.7 F (37.1 C) (04/18 0400) Pulse Rate:  [74-121] 85 (04/18 0630) Resp:  [16-29] 16 (04/18 0630) BP: (87-173)/(42-155) 139/65 (04/18 0630) SpO2:  [96 %-100 %] 98 % (04/18 0630)  General - Well nourished, well developed, intubated on sedation.  Ophthalmologic - fundi not visualized due to noncooperation.  Cardiovascular - irregularly irregular heart rate and rhythm with RVR.  Neuro - intubated on sedation, eyes closed, not following commands. With forced eye opening, eyes in mid position, not blinking to visual threat, doll's eyes absent, not tracking, PERRL. Corneal reflex absent on the left but weak on the right, gag and cough present. Breathing over the vent.  Facial symmetry not able to test due to ET tube.  Tongue protrusion not cooperative. On pain stimulation, RUE move against  gravity, LUE flaccid, BLEs slight withdraw to pain. DTR 1+ and bilateral babinski. Sensation, coordination and gait not tested.   ASSESSMENT/PLAN [name] is a 64 y.o. female with history of HTN, DM2, hx of mitral valve repair, chronic a. Fib (coumadin - INR 1.2 on admission) s/p pace maker who presented to [facility] ED as a code stroke for right gaze deviation, urinary incontinence and left sided weakness. She did not receive IV t-PA due to anticoagulation with coumadin. IR -> ICA terminus occlusion. Mechanical thrombectomy performed with 2 stent retriever + aspiration passes in the MCA-ICA (solitaire and embotrap), 1 stent retriever + aspiration pass MCA (embotrap) and 1 direct contact aspiration in  the right ACA. Complete recanalization achieved (TICI3).   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 Hyperdense right MCA suggesting extensive thrombus from the right ICA terminus through the MCA bifurcation Some mass effect with partial effacement the sulci on the right. No midline shift. ASPECTS is 4/10   CTA H&N - Total occlusion of right ICA terminus. Thrombus extends through the right MCA bifurcation with poor collaterals.   CT Perfusion - Confirmed large right MCA territory infarct with core volume of 107 mL. Large mismatch volume of 9.6 with a mismatch ratio of 1.9.   CT head -  Large area of hypoattenuation within the right MCA territory, consistent with subacute infarct. Multifocal hyperdensity within the right MCA territory is consistent with hemorrhage or contrast staining. The more peripheral hyperdensities are favored to be hemorrhagic. 6 mm leftward midline shift  CT Head 11/02/19 - Worsened edema within the right MCA territory with increased size of right basal ganglia  intraparenchymal hematoma. Increased amount of subarachnoid blood over the right hemisphere. 11 mm of leftward midline shift at the level of the foramen of Monro with early herniation of the right cingulate gyrus beneath the falx cerebri.  MRI head - not performed - PPM   2D Echo - pending  Sars Corona Virus 2 - negative  LDL - 158  HgbA1c - 9.7  UDS - negative  VTE prophylaxis - SCDs  warfarin daily prior to admission, now on No antithrombotic  Patient will be counseled to be compliant with her antithrombotic medications  Ongoing aggressive stroke risk factor management  Therapy recommendations:  pending  Disposition:  Pending  Cerebral edema  CT repeat showed 6 mm Midline Shift -> 11 mm  3% saline @ 75 cc/hr  Na 138->141->148->147  NSG on board s/p [facility] 4/18  Sodium goal 150-160  Sodium every 6 hours  PICC line placed  23.4% x 2  Atrial fibrillation  Telemetry showed A. fib, rate controlled  On Coumadin PTA, family stated compliance  Subtherapeutic INR 1.2 on presentation -> 1.4  No AC at the time due to large infarct and hemorrhagic conversion  On metoprolol 25 bid  Hypertension  Home BP meds: Atenolol; Cardizem ; lisinopril  Current BP meds: Cleviprex  Stable . SBP goal < 160 mm Hg . On metoprolol 25 bid . Long-term BP goal normotensive  Hyperlipidemia  Home Lipid lowering medication: none  LDL 158, goal < 70  Hold off statin for know due to left hemorrhagic conversion  Consider statin at discharge  Diabetes  Home diabetic meds: metformin  Current diabetic meds: SSI   HgbA1c 9.7, goal < 7.0  CBG monitoring  Dysphagia  N.p.o. for now  OG placed  Will consider TF tomorrow  On IV fluid  Other Stroke Risk Factors  Advanced age  ETOH use, advised to drink no more than 1 alcoholic beverage per day.  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 fib, dysphagia and at significant risk of neurological worsening, death form brain herniation, cerebral edema, recurrent stroke, hematoma expansion, heart failure,  aspiration pneumonia, shock. This patient's care requires constant monitoring of vital signs, hemodynamics, respiratory and cardiac monitoring, review of multiple databases, neurological assessment, discussion with family, other specialists and medical decision making of high complexity. I spent 40 minutes of neurocritical care time in the care of this patient. I had long discussion with husband at bedside, updated pt current condition, treatment plan and potential prognosis, and answered all the questions.  He expressed understanding and appreciation. I also discussed with Dr. Melvyn Novas from [facility].   Rosalin Hawking, MD PhD Stroke Neurology 11/02/2019 11:09 AM   To contact Stroke Continuity provider, please refer to [email or web address] After hours, contact General Neurology

## 2019-11-02 NOTE — Anesthesia Preprocedure Evaluation (Signed)
Anesthesia Evaluation  Patient identified by MRN, date of birth, ID band Patient unresponsive    Reviewed: Allergy & Precautions, NPO status , Patient's Chart, lab work & pertinent test results, reviewed documented beta blocker date and time , Unable to perform ROS - Chart review onlyPreop documentation limited or incomplete due to emergent nature of procedure.  History of Anesthesia Complications Negative for: history of anesthetic complications  Airway   TM Distance: >3 FB     Dental  (+) Dental Advisory Given, Teeth Intact   Pulmonary neg pulmonary ROS, neg recent URI,    breath sounds clear to auscultation       Cardiovascular hypertension, Pt. on medications and Pt. on home beta blockers + dysrhythmias Atrial Fibrillation + pacemaker + Valvular Problems/Murmurs  Rhythm:Irregular  S/p MVR  Normal LV size and thickness.  EF estimated 50%.  Inferobasal HK and flattened septum c/w with elevated PA pressures. Mild functional mitral stenosis at rest (postoperative).  Moderate left atrial dilation and mild right atrial dilation.  Mean PA pressures estimated 52 mmHg.     Neuro/Psych CVA, Residual Symptoms negative psych ROS   GI/Hepatic negative GI ROS, Neg liver ROS,   Endo/Other  diabetes, Type 2, Oral Hypoglycemic Agents  Renal/GU negative Renal ROS     Musculoskeletal   Abdominal   Peds  Hematology   Anesthesia Other Findings   Reproductive/Obstetrics                             Anesthesia Physical Anesthesia Plan  ASA: IV  Anesthesia Plan: General   Post-op Pain Management:    Induction: Intravenous  PONV Risk Score and Plan: 3 and Ondansetron, Dexamethasone and Treatment may vary due to age or medical condition  Airway Management Planned: Oral ETT  Additional Equipment: Arterial line  Intra-op Plan:   Post-operative Plan: Possible Post-op intubation/ventilation  Informed  Consent:     History available from chart only and Only emergency history available  Plan Discussed with: CRNA and Surgeon  Anesthesia Plan Comments:         Anesthesia Quick Evaluation

## 2019-11-02 NOTE — Progress Notes (Signed)
  Echocardiogram 2D Echocardiogram has been performed.  Krystal Little 11/02/2019, 2:24 PM

## 2019-11-02 NOTE — Anesthesia Postprocedure Evaluation (Signed)
Anesthesia Post Note  Patient: Krystal Little  Procedure(s) Performed: IR WITH ANESTHESIA (N/A )     Patient location during evaluation: SICU Anesthesia Type: General Level of consciousness: awake and patient cooperative Pain management: pain level controlled Vital Signs Assessment: post-procedure vital signs reviewed and stable Respiratory status: spontaneous breathing, respiratory function stable, nonlabored ventilation and patient connected to nasal cannula oxygen Cardiovascular status: stable Postop Assessment: no apparent nausea or vomiting Anesthetic complications: no    Last Vitals:  Vitals:   11/02/19 1415 11/02/19 1430  BP: 98/64 104/61  Pulse: 84 82  Resp: 15 15  Temp:    SpO2: 100% 100%    Last Pain:  Vitals:   11/02/19 0400  TempSrc: Oral  PainSc:                  Krystal Little

## 2019-11-02 NOTE — Progress Notes (Signed)
  NEUROSURGERY PROGRESS NOTE   Events of last pm reviewed, pt became much more lethargic overnight. Head CT repeated.  EXAM:  BP 139/65   Pulse 85   Temp 98.7 F (37.1 C) (Oral)   Resp 16   Ht 5\' 4"  (1.626 m)   Wt 63.6 kg   SpO2 98%   BMI 24.07 kg/m   Somnolent, groans to noxious stim Minimal eye opening Pupils sluggishly reactive W/d RUE/RLE   IMPRESSION:  64 y.o. female s/p large right MCA stroke with associated edema and progressive worsening clinical exam.  PLAN: - Will proceed with operative decompressive hemicraniectomy

## 2019-11-02 NOTE — OR Nursing (Signed)
Incision to Abdomen for Implantation of skull flap made at 0905. Closed at 0921. Count Correct.

## 2019-11-02 NOTE — Progress Notes (Signed)
Na 157, per Dr. Cheral Marker turn off 3% and check Na again in 6 hours.

## 2019-11-02 NOTE — Progress Notes (Signed)
SLP Cancellation Note  Patient Details Name: Krystal Little MRN: [id number] DOB: 1956/07/11   Cancelled treatment:       Reason Eval/Treat Not Completed: Patient at procedure or test/unavailable(Pt off unit at this time. SLP will follow up)  Kember Boch I. [name], [place], [place] Office number [phone] Pager [place] 11/02/2019, 8:45 AM

## 2019-11-02 NOTE — Progress Notes (Signed)
S/O: Called by RN to evaluate patient for acutely decreased responsiveness noted at 2:40 AM. Baseline earlier in shift was able to follow commands.   BP 136/63   Pulse 89   Temp 98 F (36.7 C) (Axillary)   Resp 20   Ht 5\' 4"  (1.626 m)   Wt 63.6 kg   SpO2 97%   BMI 24.07 kg/m   Ment: Minimal groaning to sustained sternal rub. Does not open eyes. Does not follow commands. No attempts to communicate.  CN: Will not blink to threat. PERRL at 3 mm but sluggish reactivity. No grimace to noxious.  Motor/Sensory: Flaccid tone x 4. Weak withdrawal of BLE to noxious plantar stimulation.   A/R: 1. STAT CT reveals worsened malignant edema from her stroke, relative to the prior scan from 24 hours ago, with 11 mm of right to left shift and early subfalcine herniation. Hemorrhagic products are now somewhat more prominent. Left lateral ventricle now slightly larger than on previous scan and right lateral ventricle is now completely effaced.  2. Neurosurgery has been notified. Discussed case with Ferne Reus, PA.   15 minutes of critical care time.   Electronically signed: Dr. Kerney Elbe

## 2019-11-02 NOTE — Anesthesia Preprocedure Evaluation (Deleted)
Anesthesia Evaluation  Patient identified by MRN, date of birth, ID band  Reviewed: Allergy & Precautions, NPO status   Airway        Dental   Pulmonary           Cardiovascular hypertension, + CAD       Neuro/Psych    GI/Hepatic   Endo/Other  diabetes  Renal/GU      Musculoskeletal   Abdominal   Peds  Hematology   Anesthesia Other Findings   Reproductive/Obstetrics                             Anesthesia Physical Anesthesia Plan Anesthesia Quick Evaluation

## 2019-11-02 NOTE — Consult Note (Signed)
NAME:  Krystal Little, MRN:  [id number], DOB:  1955-11-01, LOS: 2 ADMISSION DATE:  10/31/2019, CONSULTATION DATE:  4/18 REFERRING MD:  Neurology service CHIEF COMPLAINT:  Vent dep post craniotomy    Brief History   51 yobf with hbp, dm, caf on coumadin with pacemaker found down/incontinent  p 2 h since last observed on pm 4/16> to [facility] ED as code stroke with R MCA > IR endovascular thrombectomy with revascularization with large area of edema post pocedure and progressive decrease LOC  > to OR am 4/18 for decompressive hemicraniectomy and return on vent so PCCM service asked to consult  History of present illness   No independent hx obtainable  Past Medical History  HBP CAF on coumadin DM   Significant Hospital Events   4/16 IR thrombectomy  4/18 for decompressive hemicraniectomy  Consults:  NS[facility] Neurology PCCM   Procedures:  PIC LUE 4/17 Oral et 4/18 >>> L RA art line 4/18 >>>  Significant Diagnostic Tests:  CT head 4/18 1. Worsened edema within the right MCA territory with increased size of right basal ganglia intraparenchymal hematoma. 2. Increased amount of subarachnoid blood over the right hemisphere. 3. 11 mm of leftward midline shift at the level of the foramen of Monro with early herniation of the right cingulate gyrus beneath the falx cerebri  Micro Data:  RVP 4/16 neg  MRSA PCR  4/17 neg   Antimicrobials:      Scheduled Meds: . chlorhexidine  15 mL Mouth Rinse BID  . Chlorhexidine Gluconate Cloth  6 each Topical Q0600  . insulin aspart  0-15 Units Subcutaneous Q4H  . mouth rinse  15 mL Mouth Rinse q12n4p  . pantoprazole (PROTONIX) IV  40 mg Intravenous Q24H  . sodium chloride flush  10-40 mL Intracatheter Q12H   Continuous Infusions: . clevidipine Stopped (11/02/19 0901)  . potassium chloride    . sodium chloride (hypertonic) 75 mL/hr at 11/02/19 0600   PRN Meds:.acetaminophen **OR** acetaminophen (TYLENOL) oral liquid 160 mg/5 mL **OR**  acetaminophen, ondansetron (ZOFRAN) IV, promethazine, senna-docusate, sodium chloride flush   Interim history/subjective:  Just back from or/ sedated on diprovan   Objective   Blood pressure 139/65, pulse (!) 113, temperature 98.7 F (37.1 C), temperature source Oral, resp. rate 15, height 5\' 4"  (1.626 m), weight 63.6 kg, SpO2 100 %.    Vent Mode: PRVC FiO2 (%):  [60 %] 60 % Set Rate:  [15 bmp] 15 bmp Vt Set:  [430 mL] 430 mL PEEP:  [5 cmH20] 5 cmH20 Plateau Pressure:  [13 cmH20] 13 cmH20   Intake/Output Summary (Last 24 hours) at 11/02/2019 1041 Last data filed at 11/02/2019 1018 Gross per 24 hour  Intake 3847.15 ml  Output 3330 ml  Net 517.15 ml   Filed Weights   10/31/19 1907  Weight: 63.6 kg    Examination: General: sedated/intubated HENT: oral et/og Lungs: clear to A  Cardiovascular: IRIR  afib on monitor Abdomen: soft/ benign Extremities: warm/ pos pas    I personally reviewed images and agree with radiology impression as follows:  CXR:   Portable 4/18  1. Well-positioned support structures. No pneumothorax. 2. Stable mild cardiomegaly without overt pulmonary edema. No active pulmonary disease.    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 rx per NSG/ neuro    Best practice:  Diet: npo Pain/Anxiety/Delirium protocol (if indicated): diprovan/ fentanyl drip VAP protocol (if indicated): n/a DVT prophylaxis: pas  GI prophylaxis: ppi Glucose control: ssi Mobility: sbr  Code Status: full code Family Communication: per  Primary service Disposition:  ICU  Labs   CBC: Recent Labs  Lab 10/31/19 1744 10/31/19 1805 11/01/19 0945 11/02/19 0243  WBC 5.7  --  13.8* 14.0*  NEUTROABS 4.1  --   --   --   HGB 14.3 14.6 14.5 12.5  HCT 43.1 43.0 41.4 36.7  MCV 91.3  --  90.2 92.4  PLT 151  --  168 134*    Basic Metabolic Panel: Recent Labs  Lab 10/31/19 1744 10/31/19 1744 10/31/19 1805 10/31/19 2120 11/01/19 0945 11/01/19 1515 11/01/19 2018 11/02/19 0243 11/02/19 0343  NA 136   < > 138   < > 144 148* 151* 174* 147*  K 4.6  --  4.6  --  3.4*  --   --   --  3.2*  CL 102  --  103  --  110  --   --   --  115*  CO2 24  --   --   --  24  --   --   --  24  GLUCOSE 214*  --  208*  --  239*  --   --   --  173*  BUN 11  --  13  --  10  --   --   --  7*  CREATININE 0.69  --  0.50  --  0.72  --   --   --  0.58  CALCIUM 9.0  --   --   --  9.0  --   --   --  8.5*  MG  --   --   --   --   --   --   --  1.9  --    < > = values in this interval not displayed.   GFR: Estimated Creatinine Clearance: 60.5 mL/min (by C-G formula based on SCr of 0.58 mg/dL). Recent Labs  Lab 10/31/19 1744 11/01/19 0945 11/02/19 0243  WBC 5.7 13.8* 14.0*    Liver Function Tests: Recent Labs  Lab 10/31/19 1744  AST 25  ALT 20  ALKPHOS 92  BILITOT 1.1  PROT 7.4  ALBUMIN 4.1   No results for input(s): LIPASE, AMYLASE in the last 168 hours. No results for input(s): AMMONIA in the last 168 hours.  ABG    Component Value Date/Time   TCO2 29 10/31/2019 1805     Coagulation Profile: Recent Labs  Lab 10/31/19 1744 11/02/19 0243  INR 1.2 1.4*    Cardiac Enzymes: No results for input(s): CKTOTAL, CKMB, CKMBINDEX, TROPONINI in the last 168 hours.  HbA1C: HbA1c, POC (controlled diabetic range)  Date/Time Value Ref Range Status  08/18/2019 10:45 AM 12.2 (A) 0.0 - 7.0 % Final  02/04/2018 04:20 PM 8.0 (A) 0.0 - 7.0 % Final   Hgb A1c MFr Bld  Date/Time Value Ref Range Status  11/01/2019 03:48 AM 9.7 (H) 4.8 -  5.6 % Final    Comment:    (NOTE)  Pre diabetes:          5.7%-6.4% Diabetes:              >6.4% Glycemic control for   <7.0% adults with diabetes     CBG: Recent Labs  Lab 11/01/19 1203 11/01/19 1549 11/01/19 1927 11/01/19 2314 11/02/19 0330  GLUCAP 197* 148* 150* 146* 154*      Past Medical History  She,  has a past medical history of Chronic atrial fibrillation ([place]) (1990s), Diabetes mellitus type 2, uncontrolled, without complications, Essential hypertension, History of cardiac catheterization (2001), sick sinus syndrome (07/1999), S/P MVR (mitral valve repair) (08/09/1999), and 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   Surgical History    Past Surgical History:  Procedure Laterality Date  . CARDIAC CATHETERIZATION  08/03/1999   [place] - normal Coronaries; no sign of constriction or restrictive cardiomypathy  . IR PERCUTANEOUS ART THROMBECTOMY/INFUSION INTRACRANIAL INC DIAG ANGIO  10/31/2019      . MITRAL VALVE REPAIR  07/1999   Both Ant& Post leaflet repair - quadrangular resection&caudal transposition, #32 Sequin Annuloplasty ring  . PACEMAKER GENERATOR CHANGE  11/26/2008   Medtronic Adapta L(? if it has been checked since 02/2013)  . PACEMAKER INSERTION  07/1999   for SSS in setting of Chronic Afib; Medtronic Kappa [id number], [id number] H.  . TRANSTHORACIC ECHOCARDIOGRAM  05/2018   Normal LV size and thickness.  EF estimated 50%.  Inferobasal HK and flattened septum c/w with elevated PA pressures. Mild functional mitral stenosis at rest (postoperative).  Moderate left atrial dilation and mild right atrial dilation.  Mean PA pressures estimated 52 mmHg.     Social History   reports that she has never smoked. She has never used smokeless tobacco. She reports current alcohol use. She reports that she does not use drugs.   Family History   Her family history includes Cancer in her brother; Diabetes in her sister; Hypertension in her mother; Lung cancer in her brother.    Allergies No Known Allergies   Home Medications  Prior to Admission medications   Medication Sig Start Date End Date Taking? Authorizing Provider  acetaminophen (TYLENOL) 500 MG tablet Take 500-1,000 mg by mouth every 6 (six) hours as needed for headache (pain).   Yes [provider]  atenolol (TENORMIN) 50 MG tablet TAKE 1 TABLET(50 MG) BY MOUTH DAILY Patient taking differently: Take 50 mg by mouth daily.  09/26/19  Yes Leonie Man, MD  CHROMIUM PO Take 1 tablet by mouth daily.   Yes [provider]  diltiazem (CARDIZEM CD) 180 MG 24 hr capsule TAKE 1 CAPSULE(180 MG) BY MOUTH DAILY Patient taking differently: Take 180 mg by mouth daily.  09/26/19  Yes Leonie Man, MD  ezetimibe (ZETIA) 10 MG tablet Take 10 mg by mouth daily.   Yes [provider]  Gymnema Sylvestris Leaf POWD Take 1 capsule by mouth daily. "sugar destroyer"   Yes [provider]  lisinopril (ZESTRIL) 5 MG tablet TAKE 1 TABLET(5 MG) BY MOUTH DAILY Patient taking differently: Take 5 mg by mouth daily.  08/26/19  Yes Leonie Man, MD  metFORMIN (GLUCOPHAGE) 1000 MG tablet Take 1,000 mg by mouth 2 (two) times daily. 09/12/19  Yes [provider]  Multiple Vitamin (MULTIVITAMIN WITH MINERALS) TABS tablet Take 1 tablet by mouth daily.   Yes [provider]  warfarin (COUMADIN) 5 MG tablet TAKE 1 TABLET BY MOUTH DAILY EXCEPT TAKE 1 AND 1/2 TABLET ON MONDAY AND FRIDAY Patient taking differently:  Take 5 mg by mouth See admin instructions. Take 1 1/2 tablets (7.5 mg) by mouth on Monday and Friday, take 1 tablet (5 mg) on all other days of the week 08/05/18  Yes Bufford Lope, DO        The patient is critically ill with multiple organ systems failure and requires high complexity decision making for assessment and support, frequent evaluation and titration of therapies, application of advanced monitoring technologies and extensive interpretation of multiple databases.  Critical Care Time devoted to patient care services described in this note is 45 minutes.    Christinia Gully, MD Pulmonary and [place] [phone] After 6:00 PM or weekends, use [facility] [phone]  After 7:00 pm call Elink  [phone]

## 2019-11-02 NOTE — Progress Notes (Signed)
Secure chat with Dr. Erlinda Hong c/o PICC order. Patient has a defib/pacer on right. Remote placement, but asked Dr. Erlinda Hong if PICC could be placed on left side. Patient has left sided weakness, but placement on right side could disrupt pacer/defib wires. Dr. Erlinda Hong agreed and stated placing PICC on left side would be acceptable.

## 2019-11-02 NOTE — Progress Notes (Signed)
SLP Cancellation Note  Patient Details Name: Krystal Little MRN: [id number] DOB: [birth year]   Cancelled treatment:       Reason Eval/Treat Not Completed: Medical issues which prohibited therapy(Pt intubated at this time. SLP will follow up on subsequent date.)  [name] I. [name], [name], [name] Office number [phone] Pager [place] 11/02/2019, 1:28 PM

## 2019-11-02 NOTE — Anesthesia Postprocedure Evaluation (Signed)
Anesthesia Post Note  Patient: Krystal Little  Procedure(s) Performed: DECOMPRESSIVE HEMI -CRANIECTOMY, IMPLANTATION OF SKULL FLAP TO RIGHT ABDOMEN (Right Head)     Patient location during evaluation: SICU Anesthesia Type: General Level of consciousness: sedated Pain management: pain level controlled Vital Signs Assessment: post-procedure vital signs reviewed and stable Respiratory status: patient remains intubated per anesthesia plan Cardiovascular status: stable Postop Assessment: no apparent nausea or vomiting Anesthetic complications: no    Last Vitals:  Vitals:   11/02/19 1415 11/02/19 1430  BP: 98/64 104/61  Pulse: 84 82  Resp: 15 15  Temp:    SpO2: 100% 100%    Last Pain:  Vitals:   11/02/19 0400  TempSrc: Oral  PainSc:                  Wynne Jury

## 2019-11-02 NOTE — Anesthesia Procedure Notes (Signed)
Arterial Line Insertion Start/End4/18/2021 7:05 AM, 11/02/2019 7:10 AM Performed by: Neldon Newport, CRNA, CRNA  Patient location: Pre-op. Preanesthetic checklist: patient identified, IV checked, risks and benefits discussed, surgical consent, monitors and equipment checked, pre-op evaluation and timeout performed Lidocaine 1% used for infiltration Left, radial was placed Catheter size: 20 G Hand hygiene performed  and maximum sterile barriers used   Attempts: 1 Procedure performed without using ultrasound guided technique. Following insertion, dressing applied and Biopatch. Post procedure assessment: normal  Patient tolerated the procedure well with no immediate complications.

## 2019-11-02 NOTE — Transfer of Care (Signed)
Immediate Anesthesia Transfer of Care Note  Patient: Krystal Little  Procedure(s) Performed: DECOMPRESSIVE HEMI -CRANIECTOMY, IMPLANTATION OF SKULL FLAP TO RIGHT ABDOMEN (Right Head)  Patient Location: ICU  Anesthesia Type:General  Level of Consciousness: Patient remains intubated per anesthesia plan  Airway & Oxygen Therapy: Patient remains intubated per anesthesia plan and Patient placed on Ventilator (see vital sign flow sheet for setting)  Post-op Assessment: Report given to RN and Post -op Vital signs reviewed and stable  Post vital signs: Reviewed and stable  Last Vitals:  Vitals Value Taken Time  BP    Temp    Pulse    Resp    SpO2      Last Pain:  Vitals:   11/02/19 0400  TempSrc: Oral  PainSc:          Complications: No apparent anesthesia complications

## 2019-11-03 ENCOUNTER — Encounter: Payer: Self-pay | Admitting: *Deleted

## 2019-11-03 ENCOUNTER — Inpatient Hospital Stay (HOSPITAL_COMMUNITY): Payer: Medicare Other

## 2019-11-03 DIAGNOSIS — E876 Hypokalemia: Secondary | ICD-10-CM | POA: Diagnosis not present

## 2019-11-03 DIAGNOSIS — I482 Chronic atrial fibrillation, unspecified: Secondary | ICD-10-CM | POA: Diagnosis not present

## 2019-11-03 DIAGNOSIS — I63411 Cerebral infarction due to embolism of right middle cerebral artery: Secondary | ICD-10-CM | POA: Diagnosis not present

## 2019-11-03 DIAGNOSIS — J9601 Acute respiratory failure with hypoxia: Secondary | ICD-10-CM | POA: Diagnosis not present

## 2019-11-03 LAB — CBC
HCT: 35.6 % — ABNORMAL LOW (ref 36.0–46.0)
Hemoglobin: 11.5 g/dL — ABNORMAL LOW (ref 12.0–15.0)
MCH: 30.7 pg (ref 26.0–34.0)
MCHC: 32.3 g/dL (ref 30.0–36.0)
MCV: 94.9 fL (ref 80.0–100.0)
Platelets: 123 10*3/uL — ABNORMAL LOW (ref 150–400)
RBC: 3.75 MIL/uL — ABNORMAL LOW (ref 3.87–5.11)
RDW: 14.2 % (ref 11.5–15.5)
WBC: 15.3 10*3/uL — ABNORMAL HIGH (ref 4.0–10.5)
nRBC: 0 % (ref 0.0–0.2)

## 2019-11-03 LAB — SODIUM
Sodium: 153 mmol/L — ABNORMAL HIGH (ref 135–145)
Sodium: 153 mmol/L — ABNORMAL HIGH (ref 135–145)
Sodium: 157 mmol/L — ABNORMAL HIGH (ref 135–145)

## 2019-11-03 LAB — TRIGLYCERIDES: Triglycerides: 143 mg/dL (ref <150)

## 2019-11-03 LAB — BASIC METABOLIC PANEL
Anion gap: 8 (ref 5–15)
BUN: 14 mg/dL (ref 8–23)
CO2: 23 mmol/L (ref 22–32)
Calcium: 8.6 mg/dL — ABNORMAL LOW (ref 8.9–10.3)
Chloride: 124 mmol/L — ABNORMAL HIGH (ref 98–111)
Creatinine, Ser: 0.7 mg/dL (ref 0.44–1.00)
GFR calc Af Amer: >60 mL/min (ref >60)
GFR calc non Af Amer: >60 mL/min (ref >60)
Glucose, Bld: 148 mg/dL — ABNORMAL HIGH (ref 70–99)
Potassium: 3.3 mmol/L — ABNORMAL LOW (ref 3.5–5.1)
Sodium: 155 mmol/L — ABNORMAL HIGH (ref 135–145)

## 2019-11-03 LAB — GLUCOSE, CAPILLARY
Glucose-Capillary: 143 mg/dL — ABNORMAL HIGH (ref 70–99)
Glucose-Capillary: 114 mg/dL — ABNORMAL HIGH (ref 70–99)
Glucose-Capillary: 185 mg/dL — ABNORMAL HIGH (ref 70–99)
Glucose-Capillary: 179 mg/dL — ABNORMAL HIGH (ref 70–99)
Glucose-Capillary: 221 mg/dL — ABNORMAL HIGH (ref 70–99)
Glucose-Capillary: 227 mg/dL — ABNORMAL HIGH (ref 70–99)

## 2019-11-03 LAB — PROTIME-INR
INR: 1.3 — ABNORMAL HIGH (ref 0.8–1.2)
Prothrombin Time: 16.5 seconds — ABNORMAL HIGH (ref 11.4–15.2)

## 2019-11-03 MED ORDER — HEPARIN SODIUM (PORCINE) 5000 UNIT/ML IJ SOLN
5000.0000 [IU] | Freq: Three times a day (TID) | INTRAMUSCULAR | Status: DC
  Administered 2019-11-04: 5000 [IU] via SUBCUTANEOUS
  Filled 2019-11-03: qty 1

## 2019-11-03 MED ORDER — POTASSIUM CHLORIDE 20 MEQ/15ML (10%) PO SOLN: 40.0000 meq | Freq: Every day | ORAL | Status: DC

## 2019-11-03 MED ORDER — METOPROLOL TARTRATE 25 MG/10 ML ORAL SUSPENSION
50.0000 mg | Freq: Two times a day (BID) | ORAL | Status: DC
  Administered 2019-11-03 – 2019-11-04 (×2): 50 mg
  Filled 2019-11-03 (×2): qty 20

## 2019-11-03 MED ORDER — LISINOPRIL 10 MG PO TABS
10.0000 mg | ORAL_TABLET | Freq: Every day | ORAL | Status: DC
  Administered 2019-11-04: 10 mg
  Filled 2019-11-03: qty 1

## 2019-11-03 MED ORDER — PRO-STAT SUGAR FREE PO LIQD
30.0000 mL | Freq: Two times a day (BID) | ORAL | Status: DC
  Administered 2019-11-03 – 2019-11-04 (×2): 30 mL
  Filled 2019-11-03 (×3): qty 30

## 2019-11-03 MED ORDER — DILTIAZEM HCL ER COATED BEADS 180 MG PO CP24
180.0000 mg | ORAL_CAPSULE | Freq: Every day | ORAL | Status: DC
  Filled 2019-11-03: qty 1

## 2019-11-03 MED ORDER — SODIUM CHLORIDE 3 % IV SOLN
INTRAVENOUS | Status: DC
  Administered 2019-11-03: 50 mL/h via INTRAVENOUS
  Filled 2019-11-03 (×5): qty 500

## 2019-11-03 MED ORDER — LISINOPRIL 10 MG PO TABS
10.0000 mg | ORAL_TABLET | Freq: Every day | ORAL | Status: DC
  Administered 2019-11-03: 10 mg via ORAL
  Filled 2019-11-03: qty 1

## 2019-11-03 MED ORDER — SODIUM CHLORIDE 0.9 % IV SOLN: INTRAVENOUS | Status: DC

## 2019-11-03 MED ORDER — VITAL HIGH PROTEIN PO LIQD
1000.0000 mL | ORAL | Status: DC
  Administered 2019-11-03: 1000 mL

## 2019-11-03 MED ORDER — LISINOPRIL 5 MG PO TABS: 5.0000 mg | ORAL_TABLET | Freq: Every day | ORAL | Status: DC

## 2019-11-03 MED ORDER — DILTIAZEM 12 MG/ML ORAL SUSPENSION
60.0000 mg | Freq: Three times a day (TID) | ORAL | Status: DC
  Administered 2019-11-03 – 2019-11-05 (×6): 60 mg
  Filled 2019-11-03 (×8): qty 6

## 2019-11-03 MED ORDER — PANTOPRAZOLE SODIUM 40 MG PO PACK
40.0000 mg | PACK | Freq: Every day | ORAL | Status: DC
  Administered 2019-11-04 – 2019-11-20 (×17): 40 mg
  Filled 2019-11-03 (×17): qty 20

## 2019-11-03 MED ORDER — POTASSIUM CHLORIDE 20 MEQ/15ML (10%) PO SOLN
40.0000 meq | Freq: Once | ORAL | Status: AC
  Administered 2019-11-03: 40 meq
  Filled 2019-11-03: qty 30

## 2019-11-03 MED ORDER — POTASSIUM CHLORIDE 20 MEQ/15ML (10%) PO SOLN
40.0000 meq | Freq: Every day | ORAL | Status: DC
  Administered 2019-11-03: 40 meq
  Filled 2019-11-03 (×2): qty 30

## 2019-11-03 MED ORDER — SODIUM CHLORIDE 3 % IV SOLN
INTRAVENOUS | Status: DC
  Filled 2019-11-03 (×2): qty 500

## 2019-11-03 NOTE — Progress Notes (Addendum)
STROKE TEAM PROGRESS NOTE   INTERVAL HISTORY Daughter and husband at bedside.  Patient still intubated but working with PT/OT, able to follow commands on the right side although not open eyes on voice.  Had right decompressive hemicraniectomy yesterday, will repeat CT tomorrow.  Right Crani site tense but better than expected.  However, still has blood oozing from Crani site, neurosurgery contacted, will need more dressing.  OBJECTIVE Vitals:   11/03/19 0747 11/03/19 0800 11/03/19 0815 11/03/19 0830  BP:  123/62 114/80 115/65  Pulse: (!) 103 86 85 87  Resp: 12 13 13 13   Temp:  99.2 F (37.3 C)    TempSrc:      SpO2: 100% 100% 100% 99%  Weight:      Height:        CBC:  Recent Labs  Lab 10/31/19 1744 10/31/19 1805 11/02/19 0243 11/02/19 0848 11/02/19 1202 11/03/19 0408  WBC 5.7   < > 14.0*  --   --  15.3*  NEUTROABS 4.1  --   --   --   --   --   HGB 14.3   < > 12.5   < > 11.2* 11.5*  HCT 43.1   < > 36.7   < > 33.0* 35.6*  MCV 91.3   < > 92.4  --   --  94.9  PLT 151   < > 134*  --   --  123*   < > = values in this interval not displayed.    Basic Metabolic Panel:  Recent Labs  Lab 11/01/19 0945 11/02/19 0243 11/02/19 0343 11/02/19 0848 11/02/19 1202 11/02/19 1549 11/02/19 2200 11/03/19 0408  NA   < > 174* 147*   < > 151*   < > 157* 155*  K   < >  --  3.2*   < > 3.7  --   --  3.3*  CL   < >  --  115*  --   --   --   --  124*  CO2   < >  --  24  --   --   --   --  23  GLUCOSE   < >  --  173*  --   --   --   --  148*  BUN   < >  --  7*  --   --   --   --  14  CREATININE   < >  --  0.58  --   --   --   --  0.70  CALCIUM   < >  --  8.5*  --   --   --   --  8.6*  MG  --  1.9  --   --   --   --   --   --    < > = values in this interval not displayed.    Lipid Panel:     Component Value Date/Time   CHOL 239 (H) 11/01/2019 0348   CHOL 212 (H) 07/15/2018 0907   TRIG 143 11/03/2019 0408   HDL 69 11/01/2019 0348   HDL 50 07/15/2018 0907   CHOLHDL 3.5 11/01/2019  0348   VLDL 12 11/01/2019 0348   LDLCALC 158 (H) 11/01/2019 0348   LDLCALC 138 (H) 07/15/2018 0907   HgbA1c:  Lab Results  Component Value Date   HGBA1C 9.7 (H) 11/01/2019   Urine Drug Screen:     Component Value Date/Time   LABOPIA  NONE DETECTED 10/31/2019 1820   COCAINSCRNUR NONE DETECTED 10/31/2019 1820   LABBENZ NONE DETECTED 10/31/2019 1820   AMPHETMU NONE DETECTED 10/31/2019 1820   THCU NONE DETECTED 10/31/2019 1820   LABBARB NONE DETECTED 10/31/2019 1820    Alcohol Level     Component Value Date/Time   ETH <10 10/31/2019 1744    IMAGING CT HEAD CODE STROKE WO CONTRAST 10/31/2019 IMPRESSION:  1. Large right MCA territory nonhemorrhagic infarct is described.  2. Hyperdense right MCA suggesting extensive thrombus from the right ICA terminus through the MCA bifurcation  3. Some mass effect with partial effacement the sulci on the right. No midline shift.  4. ASPECTS is 4/10   CT Code Stroke CTA Head W/WO contrast CT Code Stroke CTA Neck W/WO contrast CT Code Stroke Cerebral Perfusion with contrast 10/31/2019 IMPRESSION:  1. Confirmed large right MCA territory infarct with core volume of 107 mL.  2. Large mismatch volume of 9.6 with a mismatch ratio of 1.9.  3. T occlusion of right ICA terminus.  4. Thrombus extends through the right MCA bifurcation with poor collaterals.  5. Minimal atherosclerotic changes at the carotid bifurcations bilaterally without significant stenosis.  6. No significant intracranial disease on the left or in the posterior circulation.  7. Fetal type right posterior cerebral artery originates just below the terminal ICA thrombus.   Interventional Neuro Radiology - Cerebral Angiogram with Intervention - de Rosario Jacks, MD 10/31/2019 8:20 PM ICA terminus occlusion. Mechanical thrombectomy performed with 2 stent retriever + aspiration passes in the MCA-ICA (solitaire and embotrap), 1 stent retriever + aspiration pass MCA (embotrap)  and 1 direct contact aspiration in the right ACA. Complete recanalization achieved (TICI3).  CT HEAD WO CONTRAST 11/01/2019 IMPRESSION:  1. Large area of hypoattenuation within the right MCA territory, consistent with subacute infarct.  2. Multifocal hyperdensity within the right MCA territory is consistent with hemorrhage or contrast staining. The more peripheral hyperdensities are favored to be hemorrhagic.  3. 6 mm leftward midline shift at the level of the foramina of Monro.   CT Head WO Contrast 11/02/2019 3:30 AM IMPRESSION: 1. Worsened edema within the right MCA territory with increased size of right basal ganglia intraparenchymal hematoma. 2. Increased amount of subarachnoid blood over the right hemisphere. 3. 11 mm of leftward midline shift at the level of the foramen of Monro with early herniation of the right cingulate gyrus beneath the falx cerebri  CT Head WO Contrast 11/04/2019  pending   DG Chest [facility] 11/03/2019 Stable support apparatus. No acute cardiopulmonary abnormality seen.  11/02/2019 1. Well-positioned support structures. No pneumothorax. 2. Stable mild cardiomegaly without overt pulmonary edema. No active pulmonary disease.  11/01/2019 1. Well-positioned left PICC with tip overlying the cavoatrial junction. 2. Borderline mild cardiomegaly. No pulmonary edema. No active pulmonary disease.  11/01/2019 1. Malpositioned left PICC with tip oriented superiorly in the high right mediastinum, recommend repositioning. 2. No active cardiopulmonary disease.  10/31/2019 Mild cardiomegaly. No acute chest findings.    CUP PACEART INCLINIC DEVICE CHECK  10/30/2019 Pacemaker check in clinic. Normal device function. Thresholds, sensing, impedances consistent with previous measurements. Device programmed to maximize longevity. No high ventricular rates noted. Device programmed at appropriate safety margins. Histogram  distribution appropriate for patient activity level. Device  programmed to optimize intrinsic conduction. Estimated longevity4 years. Patient enrolled in remote follow-up. Patient education completed. ROV w/ WC.Lavenia Atlas, BSN, RN  ECHOCARDIOGRAM COMPLETE 11/02/2019 1. Left ventricular ejection fraction, by estimation, is 50 to  55%. The left ventricle has low normal function. There is moderate left ventricular hypertrophy. Left ventricular diastolic parameters are indeterminate.  2. Right ventricular systolic function is moderately reduced. The right ventricular size is normal.  3. Left atrial size was moderately dilated.  4. The mitral valve has been repaired. There is a prosthetic annuloplasty ring present in the mitral position. No evidence of mitral valve regurgitation. The mean mitral valve gradient is 4.0 mmHg.  5. Tricuspid valve regurgitation is moderate.  6. The aortic valve was not well visualized. Aortic valve regurgitation is not visualized. No aortic stenosis is present.    PHYSICAL EXAM   Temp:  [98.1 F (36.7 C)-99.2 F (37.3 C)] 99.2 F (37.3 C) (04/19 0800) Pulse Rate:  [67-113] 87 (04/19 0830) Resp:  [12-25] 13 (04/19 0830) BP: (98-153)/(57-99) 115/65 (04/19 0830) SpO2:  [97 %-100 %] 99 % (04/19 0830) Arterial Line BP: (114-198)/(59-99) 140/60 (04/19 0830) FiO2 (%):  [30 %-60 %] 30 % (04/19 0747)  General - Well nourished, well developed, intubated on fentanyl.  Ophthalmologic - fundi not visualized due to noncooperation.  Cardiovascular - irregularly irregular heart rate and rhythm with RVR.  Neuro - intubated on fentanyl, eyes closed but able to open with repetitive voice stimulation, and able to follow simple commands on the right hand and foot. With eye opening, eyes in right gaze position, not blinking to visual threat, doll's eyes present, not tracking, right pupil 64mm, left 2.28mm, sluggish to light. Corneal reflex, gag and cough present. Breathing over the vent.  Facial symmetry not able to test due to ET tube.  Tongue  protrusion not cooperative. On pain stimulation, RUE move against gravity, able to follow command on right hand with repetitive verbal commands, LUE slight withdraw to pain, BLEs mild withdraw to pain R>L. DTR 1+ and bilateral babinski. Sensation, coordination and gait not tested.   ASSESSMENT/PLAN Krystal Little is a 64 y.o. female with history of HTN, DM2, hx of mitral valve repair, chronic a. Fib (coumadin - INR 1.2 on admission) s/p pacemaker who presented to [facility] ED as a code stroke for right gaze deviation, urinary incontinence and left sided weakness. She did not receive IV t-PA due to anticoagulation with coumadin. IR -> ICA terminus occlusion. Mechanical thrombectomy performed with 2 stent retriever + aspiration passes in the MCA-ICA (solitaire and embotrap), 1 stent retriever + aspiration pass MCA (embotrap) and 1 direct contact aspiration in the right ACA. Complete recanalization achieved (TICI3).  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 Hyperdense right MCA suggesting extensive thrombus from the right ICA terminus through the MCA bifurcation Some mass effect with partial effacement the sulci on the right. No midline shift. ASPECTS is 4/10   CTA H&N - Total occlusion of right ICA terminus. Thrombus extends through the right MCA bifurcation with poor collaterals.   CT Perfusion - Confirmed large right MCA territory infarct with core volume of 107 mL. Large mismatch volume of 9.6 with a mismatch ratio of 1.9.   CT head -  Large area of hypoattenuation within the right MCA territory, consistent with subacute infarct. Multifocal hyperdensity within the right MCA territory is consistent with hemorrhage or contrast staining. The more peripheral hyperdensities are favored to be  hemorrhagic. 6 mm leftward midline shift  CT Head 11/02/19 - Worsened  edema within the right MCA territory with increased size of right basal ganglia intraparenchymal hematoma. Increased amount of subarachnoid blood over the right hemisphere. 11 mm of leftward midline shift at the level of the foramen of Monro with early herniation of the right cingulate gyrus beneath the falx cerebri.  MRI head - not performed - PPM   CT head 4/20 pending   2D Echo - EF 50-55%. No source of embolus. 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   [employer] Virus 2 - negative  LDL - 158  HgbA1c - 9.7  UDS - negative  VTE prophylaxis - SCDs  warfarin daily prior to admission, now on No antithrombotic  Therapy recommendations:  CIR - admission coordinator following  Disposition:  Pending  Acute hypoxemia respiratory failure d/t stroke  Intubated for emergent hemicraniectomy for cerebral edema  Plan extubation with MS improvement  CCM on board  On fentanyl  Cerebral edema s/p decompressive hemicraniectomy  CT repeat showed 6 mm Midline Shift -> 11 mm  3% saline @ 50 cc/hr   Na 138->141->148->147->157->155->153  NSG on board s/p [facility] 4/18  Sodium goal 150-160  Sodium every 6 hours  PICC line placed  23.4% x 2  Atrial fibrillation w/ RVR  Telemetry showed A. fib, rate controlled  On Coumadin PTA, family stated compliance  Subtherapeutic INR 1.2 on presentation -> 1.4  No AC at the time due to large infarct and hemorrhagic conversion  On metoprolol 25 bid->50 bid  On cardizem 60 q 8h  Hypertension  Home BP meds: Atenolol; Cardizem ; lisinopril  Current BP meds: Cleviprex  Stable . SBP goal < 160 mm Hg . On metoprolol, cardizem and lisinopril . Long-term BP goal normotensive  Hyperlipidemia  Home Lipid lowering medication: zetia  LDL 158, goal < 70  Hold off statin for know due to left hemorrhagic conversion  Consider statin at  discharge  Diabetes  Home diabetic meds: metformin  Current diabetic meds: SSI   HgbA1c 9.7, goal < 7.0  CBG monitoring  Dysphagia  N.p.o. for now  OG placed  On TF @ 25  Speech following  Other Stroke Risk Factors  Advanced age  ETOH use, advised to drink no more than 1 alcoholic beverage per day.  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 fib, dysphagia and at significant risk of neurological worsening, death form brain herniation, cerebral edema, recurrent stroke, hematoma expansion, heart failure, aspiration pneumonia, shock. This patient's care requires constant monitoring of vital signs, hemodynamics, respiratory and cardiac monitoring, review of multiple databases, neurological assessment, discussion with family, other specialists and medical decision making of high complexity. I spent 40 minutes of neurocritical care time in the care of this patient. I had long discussion with husband at bedside, updated pt current condition, treatment plan and potential prognosis, and answered all the questions.  He expressed understanding and appreciation. I also discussed with NSG PA.   Rosalin Hawking, MD PhD Stroke Neurology 11/03/2019 8:51 AM   To contact Stroke Continuity provider, please refer to [email or web address] After hours, contact General Neurology

## 2019-11-03 NOTE — Progress Notes (Signed)
eLink Physician-Brief Progress Note Patient Name: CHASSIE PENNIX DOB: [birth year] MRN: [id number]   Date of Service  11/03/2019  HPI/Events of Note  Low K, normal cr. Has Tube.   eICU Interventions  Kcl 40 meq via tube ordered.      Intervention Category Minor Interventions: Electrolytes abnormality - evaluation and management  Elmer Sow 11/03/2019, 5:40 AM

## 2019-11-03 NOTE — Progress Notes (Signed)
Rehab Admissions Coordinator Note:  Patient was screened by Cleatrice Burke for appropriateness for an Inpatient Acute Rehab Consult per PT recs. Noted remains on vent. Await extubation and therapy progress before proceeding with rehab consult.Cleatrice Burke RN MSN 11/03/2019, 2:36 PM  I can be reached at [phone].

## 2019-11-03 NOTE — Progress Notes (Signed)
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 Karenann Cai. Post procedure edema and progressive clinical worsening. S/p hemicraniotomy. [name]   Allergies: Patient has no known allergies.  Medications:  Current Facility-Administered Medications:  .  0.9 %  sodium chloride infusion, , Intravenous, Continuous, Rosalin Hawking, MD, Last Rate: 25 mL/hr at 11/03/19 1000, Rate Verify at 11/03/19 1000 .  acetaminophen (TYLENOL) tablet 650 mg, 650 mg, Oral, Q4H PRN **OR** acetaminophen (TYLENOL) 160 MG/5ML solution 650 mg, 650 mg, Per Tube, Q4H PRN **OR** acetaminophen (TYLENOL) suppository 650 mg, 650 mg, Rectal, Q4H PRN, Vonzella Nipple, NP, 650 mg at 11/01/19 0921 .  chlorhexidine gluconate (MEDLINE KIT) (PERIDEX) 0.12 % solution 15 mL, 15 mL, Mouth Rinse, BID, Kerney Elbe, MD, 15 mL at 11/03/19 0735 .  Chlorhexidine Gluconate Cloth 2 % PADS 6 each, 6 each, Topical, Q0600, Greta Doom, MD, 6 each at 11/03/19 0550 .  clevidipine (CLEVIPREX) infusion 0.5 mg/mL, 0-21 mg/hr, Intravenous, Continuous, Rosalin Hawking, MD, Last Rate: 10 mL/hr at 11/03/19 1000, 5 mg/hr at 11/03/19 1000 .  diltiazem (CARDIZEM) 10 mg/ml oral suspension 60 mg, 60 mg, Per Tube, Q8H, Corey Harold, NP .  fentaNYL 2574mg in NS 2532m(1042mml) infusion-PREMIX, 0-400 mcg/hr, Intravenous, Continuous, WerTanda RockersD, Last Rate: 2.5 mL/hr at 11/03/19 1000, 25 mcg/hr at 11/03/19 1000 .  insulin aspart (novoLOG) injection 0-15 Units, 0-15 Units, Subcutaneous, Q4H, LinKerney ElbeD, 2 Units at 11/03/19 0406 .  MEDLINE mouth  rinse, 15 mL, Mouth Rinse, 10 times per day, Xu,Rosalin HawkingD, 15 mL at 11/03/19 1024 .  MEDLINE mouth rinse, 15 mL, Mouth Rinse, 10 times per day, LinKerney ElbeD, 15 mL at 11/03/19 0550 .  metoprolol tartrate (LOPRESSOR) 25 mg/10 mL oral suspension 50 mg, 50 mg, Per Tube, BID, Xu,Rosalin HawkingD .  ondansetron (ZOSouth[facility] Surgical Center Incnjection 4 mg, 4 mg, Intravenous, Q6H PRN, LinKerney ElbeD, 4 mg at 11/01/19 0640 .  pantoprazole (PROTONIX) injection 40 mg, 40 mg, Intravenous, Q24H, Xu,Rosalin HawkingD, 40 mg at 11/03/19 0842 .  potassium chloride 20 MEQ/15ML (10%) solution 40 mEq, 40 mEq, Per Tube, Daily, Xu,Rosalin HawkingD, 40 mEq at 11/03/19 0559 .  promethazine (PHENERGAN) injection 12.5 mg, 12.5 mg, Intravenous, Q4H PRN, LinKerney ElbeD .  propofol (DIPRIVAN) 1000 MG/100ML infusion, 5-80 mcg/kg/min, Intravenous, Titrated, WerTanda RockersD, Stopped at 11/02/19 2044 .  senna-docusate (Senokot-S) tablet 1 tablet, 1 tablet, Per Tube, QHS PRN, Xu,Rosalin HawkingD .  sodium chloride (hypertonic) 3 % solution, , Intravenous, Continuous, Xu,Rosalin HawkingD, Stopped at 11/02/19 2253 .  sodium chloride flush (NS) 0.9 % injection 10-40 mL, 10-40 mL, Intracatheter, Q12H, Xu,Rosalin HawkingD, 10 mL at 11/03/19 1023 .  sodium chloride flush (NS) 0.9 % injection 10-40 mL, 10-40 mL, Intracatheter, PRN, Xu,Rosalin HawkingD    Vital Signs: BP 131/76   Pulse (!) 126   Temp 99.2 F (37.3 C)   Resp 16   Ht '5\' 4"'  (1.626 m)   Wt 63.6 kg   SpO2 98%   BMI 24.07 kg/m   Physical Exam Constitutional:      General: She is not in acute  distress.    Comments: Sedated on vent  Pulmonary:     Effort: Pulmonary effort is normal. No respiratory distress.  Skin:    General: Skin is warm and dry.     Comments: Right groin incision soft without active bleeding or hematoma.  Neurological:     Comments: Sedated on vent. Did not follow commands. Distal pulses (DPs) 2+ bilaterally.     Imaging: CT Code Stroke CTA Head W/WO  contrast  Result Date: 10/31/2019 CLINICAL DATA:  Last seen normal 5 hours ago. New onset left-sided weakness. Left facial droop and gaze. EXAM: CT ANGIOGRAPHY HEAD AND NECK CT PERFUSION BRAIN TECHNIQUE: Multidetector CT imaging of the head and neck was performed using the standard protocol during bolus administration of intravenous contrast. Multiplanar CT image reconstructions and MIPs were obtained to evaluate the vascular anatomy. Carotid stenosis measurements (when applicable) are obtained utilizing NASCET criteria, using the distal internal carotid diameter as the denominator. Multiphase CT imaging of the brain was performed following IV bolus contrast injection. Subsequent parametric perfusion maps were calculated using RAPID software. CONTRAST:  156m OMNIPAQUE IOHEXOL 350 MG/ML SOLN COMPARISON:  CT head without contrast 10/31/2019. FINDINGS: CTA NECK FINDINGS Aortic arch: A 4 vessel arch configuration is present. Left vertebral artery originates directly from the arch. No significant atherosclerotic calcification or stenosis is present. Right carotid system: The right common carotid artery is within normal limits. Atherosclerotic changes are present at the proximal right ICA without a significant stenosis. Cervical right ICA is otherwise. Left carotid system: The left common carotid artery is within normal limits. Minimal atherosclerotic changes are present without significant stenosis. Cervical left ICA is otherwise normal. Vertebral arteries: Right vertebral artery originates from the subclavian artery without significant stenosis. It is the dominant vessel. No significant stenosis is present in either vertebral artery in the neck. Skeleton: Mild degenerative changes are present at C5-6. Uncovertebral spurring is present the right C4-5. No focal lytic or blastic lesions are present. Other neck: The soft tissues the neck are otherwise unremarkable. Upper chest: The lung apices are clear. Thoracic inlet  is within normal limits. Review of the MIP images confirms the above findings CTA HEAD FINDINGS Anterior circulation: The right internal carotid artery is occluded at the terminus. Clot extends beyond the right MCA bifurcation. Anterior branches opacify. Poor collateralization is evident posteriorly. The left internal carotid artery is within normal limits through the terminus. Left A1 and M1 segments are normal. Anterior communicating artery is patent. Both ACA vessels fill from the left. Retrograde right A1 flow does not reach the ICA terminus secondary to the T occlusion. Left MCA bifurcation and branch vessels are within normal limits. Posterior circulation: The right vertebral artery is dominant vessel. PICA origins are visualized and within normal limits. Basilar artery is normal. The left posterior cerebral artery originates from the basilar tip. The right posterior cerebral artery is of fetal type is fed from just below the thrombus. PCA branch vessels are within normal limits bilaterally. Venous sinuses: The dural sinuses are patent. Straight sinus deep cerebral veins are intact. Cortical veins are within normal limits. No vascular malformation is present. Anatomic variants: Fetal type right posterior cerebral artery. Review of the MIP images confirms the above findings CT Brain Perfusion Findings: ASPECTS: 4/10 CBF (<30%) Volume: 1083mPerfusion (Tmax>6.0s) volume: 20349mismatch Volume: 53m60mfarction Location:Right MCA territory IMPRESSION: 1. Confirmed large right MCA territory infarct with core volume of 107 mL. 2. Large mismatch volume of 9.6 with a mismatch ratio of  1.9. 3. T occlusion of right ICA terminus. 4. Thrombus extends through the right MCA bifurcation with poor collaterals. 5. Minimal atherosclerotic changes at the carotid bifurcations bilaterally without significant stenosis. 6. No significant intracranial disease on the left or in the posterior circulation. 7. Fetal type right posterior  cerebral artery originates just below the terminal ICA thrombus. Electronically Signed   By: San Morelle M.D.   On: 10/31/2019 18:17   CT HEAD WO CONTRAST  Result Date: 11/02/2019 CLINICAL DATA:  Stroke follow-up EXAM: CT HEAD WITHOUT CONTRAST TECHNIQUE: Contiguous axial images were obtained from the base of the skull through the vertex without intravenous contrast. COMPARISON:  None. FINDINGS: Brain: Worsened edema within the right MCA territory with increased size of hyperdense collection in the right basal ganglia consistent with worsening hemorrhage. The intraparenchymal hematoma now measures 4.5 x 2.0 cm. There is 11 mm of leftward midline shift at the level of the foramen of Monro. There is early herniation of the right cingulate gyrus beneath the falx cerebri. The right lateral ventricle is effaced. No ventricular entrapment at this time. Basal cisterns remain patent. Increased amount of subarachnoid blood over the right hemisphere. Vascular: No abnormal hyperdensity of the major intracranial arteries or dural venous sinuses. No intracranial atherosclerosis. Skull: The visualized skull base, calvarium and extracranial soft tissues are normal. Sinuses/Orbits: No fluid levels or advanced mucosal thickening of the visualized paranasal sinuses. No mastoid or middle ear effusion. The orbits are normal. IMPRESSION: 1. Worsened edema within the right MCA territory with increased size of right basal ganglia intraparenchymal hematoma. 2. Increased amount of subarachnoid blood over the right hemisphere. 3. 11 mm of leftward midline shift at the level of the foramen of Monro with early herniation of the right cingulate gyrus beneath the falx cerebri Critical Value/emergent results were called by telephone at the time of interpretation on 11/02/2019 at 3:31 am to provider ERIC [facility] , who verbally acknowledged these results. Electronically Signed   By: Ulyses Jarred M.D.   On: 11/02/2019 03:30   CT HEAD  WO CONTRAST  Result Date: 11/01/2019 CLINICAL DATA:  Stroke follow-up EXAM: CT HEAD WITHOUT CONTRAST TECHNIQUE: Contiguous axial images were obtained from the base of the skull through the vertex without intravenous contrast. COMPARISON:  CT perfusion scan 10/31/2019 FINDINGS: Brain: Large area of hypoattenuation within the right MCA territory. The right lateral ventricle is compressed. There is leftward midline shift that measures 6 mm at the level of the foramina of Monro. Multifocal hyperdensity within the right MCA territory is consistent with hemorrhage or contrast staining. The more peripheral hyperdensities are favored to be hemorrhagic. The largest of these measures 6 mm. Vascular: No hyperdense vessel or unexpected calcification. Skull: Normal. Negative for fracture or focal lesion. Sinuses/Orbits: No acute finding. Other: None. IMPRESSION: 1. Large area of hypoattenuation within the right MCA territory, consistent with subacute infarct. 2. Multifocal hyperdensity within the right MCA territory is consistent with hemorrhage or contrast staining. The more peripheral hyperdensities are favored to be hemorrhagic. 3. 6 mm leftward midline shift at the level of the foramina of Monro. Electronically Signed   By: Ulyses Jarred M.D.   On: 11/01/2019 02:45   CT Code Stroke CTA Neck W/WO contrast  Result Date: 10/31/2019 CLINICAL DATA:  Last seen normal 5 hours ago. New onset left-sided weakness. Left facial droop and gaze. EXAM: CT ANGIOGRAPHY HEAD AND NECK CT PERFUSION BRAIN TECHNIQUE: Multidetector CT imaging of the head and neck was performed using the standard protocol  during bolus administration of intravenous contrast. Multiplanar CT image reconstructions and MIPs were obtained to evaluate the vascular anatomy. Carotid stenosis measurements (when applicable) are obtained utilizing NASCET criteria, using the distal internal carotid diameter as the denominator. Multiphase CT imaging of the brain was  performed following IV bolus contrast injection. Subsequent parametric perfusion maps were calculated using RAPID software. CONTRAST:  155m OMNIPAQUE IOHEXOL 350 MG/ML SOLN COMPARISON:  CT head without contrast 10/31/2019. FINDINGS: CTA NECK FINDINGS Aortic arch: A 4 vessel arch configuration is present. Left vertebral artery originates directly from the arch. No significant atherosclerotic calcification or stenosis is present. Right carotid system: The right common carotid artery is within normal limits. Atherosclerotic changes are present at the proximal right ICA without a significant stenosis. Cervical right ICA is otherwise. Left carotid system: The left common carotid artery is within normal limits. Minimal atherosclerotic changes are present without significant stenosis. Cervical left ICA is otherwise normal. Vertebral arteries: Right vertebral artery originates from the subclavian artery without significant stenosis. It is the dominant vessel. No significant stenosis is present in either vertebral artery in the neck. Skeleton: Mild degenerative changes are present at C5-6. Uncovertebral spurring is present the right C4-5. No focal lytic or blastic lesions are present. Other neck: The soft tissues the neck are otherwise unremarkable. Upper chest: The lung apices are clear. Thoracic inlet is within normal limits. Review of the MIP images confirms the above findings CTA HEAD FINDINGS Anterior circulation: The right internal carotid artery is occluded at the terminus. Clot extends beyond the right MCA bifurcation. Anterior branches opacify. Poor collateralization is evident posteriorly. The left internal carotid artery is within normal limits through the terminus. Left A1 and M1 segments are normal. Anterior communicating artery is patent. Both ACA vessels fill from the left. Retrograde right A1 flow does not reach the ICA terminus secondary to the T occlusion. Left MCA bifurcation and branch vessels are within  normal limits. Posterior circulation: The right vertebral artery is dominant vessel. PICA origins are visualized and within normal limits. Basilar artery is normal. The left posterior cerebral artery originates from the basilar tip. The right posterior cerebral artery is of fetal type is fed from just below the thrombus. PCA branch vessels are within normal limits bilaterally. Venous sinuses: The dural sinuses are patent. Straight sinus deep cerebral veins are intact. Cortical veins are within normal limits. No vascular malformation is present. Anatomic variants: Fetal type right posterior cerebral artery. Review of the MIP images confirms the above findings CT Brain Perfusion Findings: ASPECTS: 4/10 CBF (<30%) Volume: 1075mPerfusion (Tmax>6.0s) volume: 20313mismatch Volume: 51m86mfarction Location:Right MCA territory IMPRESSION: 1. Confirmed large right MCA territory infarct with core volume of 107 mL. 2. Large mismatch volume of 9.6 with a mismatch ratio of 1.9. 3. T occlusion of right ICA terminus. 4. Thrombus extends through the right MCA bifurcation with poor collaterals. 5. Minimal atherosclerotic changes at the carotid bifurcations bilaterally without significant stenosis. 6. No significant intracranial disease on the left or in the posterior circulation. 7. Fetal type right posterior cerebral artery originates just below the terminal ICA thrombus. Electronically Signed   By: ChriSan Morelle.   On: 10/31/2019 18:17   CT Code Stroke Cerebral Perfusion with contrast  Result Date: 10/31/2019 CLINICAL DATA:  Last seen normal 5 hours ago. New onset left-sided weakness. Left facial droop and gaze. EXAM: CT ANGIOGRAPHY HEAD AND NECK CT PERFUSION BRAIN TECHNIQUE: Multidetector CT imaging of the head and neck was performed using  the standard protocol during bolus administration of intravenous contrast. Multiplanar CT image reconstructions and MIPs were obtained to evaluate the vascular anatomy. Carotid  stenosis measurements (when applicable) are obtained utilizing NASCET criteria, using the distal internal carotid diameter as the denominator. Multiphase CT imaging of the brain was performed following IV bolus contrast injection. Subsequent parametric perfusion maps were calculated using RAPID software. CONTRAST:  175m OMNIPAQUE IOHEXOL 350 MG/ML SOLN COMPARISON:  CT head without contrast 10/31/2019. FINDINGS: CTA NECK FINDINGS Aortic arch: A 4 vessel arch configuration is present. Left vertebral artery originates directly from the arch. No significant atherosclerotic calcification or stenosis is present. Right carotid system: The right common carotid artery is within normal limits. Atherosclerotic changes are present at the proximal right ICA without a significant stenosis. Cervical right ICA is otherwise. Left carotid system: The left common carotid artery is within normal limits. Minimal atherosclerotic changes are present without significant stenosis. Cervical left ICA is otherwise normal. Vertebral arteries: Right vertebral artery originates from the subclavian artery without significant stenosis. It is the dominant vessel. No significant stenosis is present in either vertebral artery in the neck. Skeleton: Mild degenerative changes are present at C5-6. Uncovertebral spurring is present the right C4-5. No focal lytic or blastic lesions are present. Other neck: The soft tissues the neck are otherwise unremarkable. Upper chest: The lung apices are clear. Thoracic inlet is within normal limits. Review of the MIP images confirms the above findings CTA HEAD FINDINGS Anterior circulation: The right internal carotid artery is occluded at the terminus. Clot extends beyond the right MCA bifurcation. Anterior branches opacify. Poor collateralization is evident posteriorly. The left internal carotid artery is within normal limits through the terminus. Left A1 and M1 segments are normal. Anterior communicating artery is  patent. Both ACA vessels fill from the left. Retrograde right A1 flow does not reach the ICA terminus secondary to the T occlusion. Left MCA bifurcation and branch vessels are within normal limits. Posterior circulation: The right vertebral artery is dominant vessel. PICA origins are visualized and within normal limits. Basilar artery is normal. The left posterior cerebral artery originates from the basilar tip. The right posterior cerebral artery is of fetal type is fed from just below the thrombus. PCA branch vessels are within normal limits bilaterally. Venous sinuses: The dural sinuses are patent. Straight sinus deep cerebral veins are intact. Cortical veins are within normal limits. No vascular malformation is present. Anatomic variants: Fetal type right posterior cerebral artery. Review of the MIP images confirms the above findings CT Brain Perfusion Findings: ASPECTS: 4/10 CBF (<30%) Volume: 1015mPerfusion (Tmax>6.0s) volume: 20362mismatch Volume: 68m3mfarction Location:Right MCA territory IMPRESSION: 1. Confirmed large right MCA territory infarct with core volume of 107 mL. 2. Large mismatch volume of 9.6 with a mismatch ratio of 1.9. 3. T occlusion of right ICA terminus. 4. Thrombus extends through the right MCA bifurcation with poor collaterals. 5. Minimal atherosclerotic changes at the carotid bifurcations bilaterally without significant stenosis. 6. No significant intracranial disease on the left or in the posterior circulation. 7. Fetal type right posterior cerebral artery originates just below the terminal ICA thrombus. Electronically Signed   By: ChriSan Morelle.   On: 10/31/2019 18:17   DG Chest Port 1 View  Result Date: 11/03/2019 CLINICAL DATA:  Acute respiratory failure with hypoxia. EXAM: PORTABLE CHEST 1 VIEW COMPARISON:  November 02, 2019. FINDINGS: Stable cardiomediastinal silhouette. No pneumothorax or pleural effusion is noted. Endotracheal and nasogastric tubes are unchanged  in position.  Left-sided PICC line is unchanged. Stable right-sided pacemaker. Both lungs are clear. The visualized skeletal structures are unremarkable. IMPRESSION: Stable support apparatus. No acute cardiopulmonary abnormality seen. Electronically Signed   By: Marijo Conception M.D.   On: 11/03/2019 08:10   DG CHEST PORT 1 VIEW  Result Date: 11/02/2019 CLINICAL DATA:  Intubated EXAM: PORTABLE CHEST 1 VIEW COMPARISON:  Chest radiograph from one day prior. FINDINGS: Stable configuration of 2 lead right subclavian pacemaker. Intact sternotomy wires. Left PICC terminates over the cavoatrial junction. Endotracheal tube tip is 2.1 cm above the carina. Enteric tube terminates in the proximal stomach. Stable cardiomediastinal silhouette with mild cardiomegaly. No pneumothorax. No pleural effusion. No overt pulmonary edema. No acute consolidative airspace disease. IMPRESSION: 1. Well-positioned support structures. No pneumothorax. 2. Stable mild cardiomegaly without overt pulmonary edema. No active pulmonary disease. Electronically Signed   By: Ilona Sorrel M.D.   On: 11/02/2019 11:51   DG CHEST PORT 1 VIEW  Result Date: 11/01/2019 CLINICAL DATA:  PICC repositioning EXAM: PORTABLE CHEST 1 VIEW COMPARISON:  Chest radiograph from earlier today. FINDINGS: Well-positioned left PICC with tip overlying the cavoatrial junction. Intact sternotomy wires. Stable configuration of 2 lead right subclavian pacemaker. Stable cardiomediastinal silhouette with borderline mild cardiomegaly. No pneumothorax. No pleural effusion. Lungs appear clear, with no acute consolidative airspace disease and no pulmonary edema. IMPRESSION: 1. Well-positioned left PICC with tip overlying the cavoatrial junction. 2. Borderline mild cardiomegaly. No pulmonary edema. No active pulmonary disease. Electronically Signed   By: Ilona Sorrel M.D.   On: 11/01/2019 18:47   DG CHEST PORT 1 VIEW  Result Date: 11/01/2019 CLINICAL DATA:  PICC placement EXAM:  PORTABLE CHEST 1 VIEW COMPARISON:  Chest radiograph from one day prior. FINDINGS: Stable configuration of 2 lead right subclavian pacemaker. Intact sternotomy wires. Malpositioned left PICC with tip oriented superiorly in high right mediastinum. Stable cardiomediastinal silhouette with top-normal heart size. No pneumothorax. No pleural effusion. No overt pulmonary edema. No acute consolidative airspace disease. IMPRESSION: 1. Malpositioned left PICC with tip oriented superiorly in the high right mediastinum, recommend repositioning. 2. No active cardiopulmonary disease. These results were called by telephone at the time of interpretation on 11/01/2019 at 5:59 pm to provider [facility] , who verbally acknowledged these results. Electronically Signed   By: Ilona Sorrel M.D.   On: 11/01/2019 18:04   DG Chest Port 1 View  Result Date: 10/31/2019 CLINICAL DATA:  Stroke. EXAM: PORTABLE CHEST 1 VIEW COMPARISON:  11/26/2008 FINDINGS: Right-sided pacemaker in place. Post median sternotomy. Mild cardiomegaly. No pulmonary edema, focal airspace disease, pleural effusion or pneumothorax. Bones appear under mineralized. IMPRESSION: Mild cardiomegaly. No acute chest findings. Electronically Signed   By: Keith Rake M.D.   On: 10/31/2019 21:54   ECHOCARDIOGRAM COMPLETE  Result Date: 11/02/2019    ECHOCARDIOGRAM REPORT   Patient Name:   Krystal Little Date of Exam: 11/02/2019 Medical Rec #:  [id number]        Height:       64.0 in Accession #:    [id number]       Weight:       140.2 lb Date of Birth:  14-Jul-1956         BSA:          1.682 m Patient Age:    64 years         BP:           111/73 mmHg Patient Gender: F  HR:           82 bpm. Exam Location:  Inpatient Procedure: 2D Echo, Cardiac Doppler and Color Doppler Indications:    Stroke 434.91/I163.9  History:        Patient has prior history of Echocardiogram examinations, most                 recent 05/27/2018. Pacemaker, Mitral Valve Disease; Risk                  Factors:Diabetes, Hypertension and Dyslipidemia. S/p mitral                 valve repair.  Sonographer:    Clayton Lefort RDCS (AE) Referring Phys: [id number] [place]  Sonographer Comments: Echo performed with patient supine and on artificial respirator. IMPRESSIONS  1. Left ventricular ejection fraction, by estimation, is 50 to 55%. The left ventricle has low normal function. There is moderate left ventricular hypertrophy. Left ventricular diastolic parameters are indeterminate.  2. Right ventricular systolic function is moderately reduced. The right ventricular size is normal.  3. Left atrial size was moderately dilated.  4. The mitral valve has been repaired. There is a prosthetic annuloplasty ring present in the mitral position. No evidence of mitral valve regurgitation. The mean mitral valve gradient is 4.0 mmHg.  5. Tricuspid valve regurgitation is moderate.  6. The aortic valve was not well visualized. Aortic valve regurgitation is not visualized. No aortic stenosis is present. FINDINGS  Left Ventricle: Left ventricular ejection fraction, by estimation, is 50 to 55%. The left ventricle has low normal function. The left ventricle has no regional wall motion abnormalities. The left ventricular internal cavity size was small. There is moderate left ventricular hypertrophy. Left ventricular diastolic parameters are indeterminate. Right Ventricle: The right ventricular size is normal. Right vetricular wall thickness was not assessed. Right ventricular systolic function is moderately reduced. There is mildly elevated pulmonary artery systolic pressure. The tricuspid regurgitant velocity is 2.76 m/s, and with an assumed right atrial pressure of 10 mmHg, the estimated right ventricular systolic pressure is 45.8 mmHg. Left Atrium: Left atrial size was moderately dilated. Right Atrium: Right atrial size was normal in size. Pericardium: Trivial pericardial effusion is present. Mitral Valve: The mitral  valve has been repaired/replaced. No evidence of mitral valve regurgitation. There is a prosthetic annuloplasty ring present in the mitral position. MV peak gradient, 10.5 mmHg. The mean mitral valve gradient is 4.0 mmHg. Tricuspid Valve: The tricuspid valve is normal in structure. Tricuspid valve regurgitation is moderate. Aortic Valve: The aortic valve was not well visualized. Aortic valve regurgitation is not visualized. No aortic stenosis is present. Aortic valve mean gradient measures 2.0 mmHg. Aortic valve peak gradient measures 4.5 mmHg. Aortic valve area, by VTI measures 1.89 cm. Pulmonic Valve: The pulmonic valve was not well visualized. Pulmonic valve regurgitation is not visualized. Aorta: The aortic root and ascending aorta are structurally normal, with no evidence of dilitation. Venous: IVC assessment for right atrial pressure unable to be performed due to mechanical ventilation. IAS/Shunts: No atrial level shunt detected by color flow Doppler.  LEFT VENTRICLE PLAX 2D LVIDd:         3.60 cm LVIDs:         2.70 cm LV PW:         2.00 cm LV IVS:        1.40 cm LVOT diam:     1.90 cm LV SV:  37 LV SV Index:   22 LVOT Area:     2.84 cm  RIGHT VENTRICLE            IVC RV Basal diam:  2.80 cm    IVC diam: 2.40 cm RV S prime:     5.51 cm/s TAPSE (M-mode): 1.0 cm LEFT ATRIUM              Index       RIGHT ATRIUM           Index LA diam:        4.60 cm  2.73 cm/m  RA Area:     15.00 cm LA Vol (A2C):   101.0 ml 60.04 ml/m RA Volume:   38.50 ml  22.89 ml/m LA Vol (A4C):   52.8 ml  31.39 ml/m LA Biplane Vol: 74.5 ml  44.29 ml/m  AORTIC VALVE AV Area (Vmax):    1.82 cm AV Area (Vmean):   1.77 cm AV Area (VTI):     1.89 cm AV Vmax:           106.00 cm/s AV Vmean:          74.400 cm/s AV VTI:            0.198 m AV Peak Grad:      4.5 mmHg AV Mean Grad:      2.0 mmHg LVOT Vmax:         68.14 cm/s LVOT Vmean:        46.340 cm/s LVOT VTI:          0.132 m LVOT/AV VTI ratio: 0.67  AORTA Ao Root diam:  3.00 cm Ao Asc diam:  3.00 cm MITRAL VALVE            TRICUSPID VALVE MV Peak grad: 10.5 mmHg TR Peak grad:   30.5 mmHg MV Mean grad: 4.0 mmHg  TR Vmax:        276.00 cm/s MV Vmax:      1.62 m/s MV Vmean:     91.8 cm/s SHUNTS                         Systemic VTI:  0.13 m                         Systemic Diam: 1.90 cm Oswaldo Milian MD Electronically signed by Oswaldo Milian MD Signature Date/Time: 11/02/2019/7:09:04 PM    Final    CUP PACEART INCLINIC DEVICE CHECK  Result Date: 10/30/2019 Pacemaker check in clinic. Normal device function. Thresholds, sensing, impedances consistent with previous measurements. Device programmed to maximize longevity. No high ventricular rates noted. Device programmed at appropriate safety margins. Histogram  distribution appropriate for patient activity level. Device programmed to optimize intrinsic conduction. Estimated longevity4 years. Patient enrolled in remote follow-up. Patient education completed. ROV w/ WC.Lavenia Atlas, BSN, RN  CT HEAD CODE STROKE WO CONTRAST  Result Date: 10/31/2019 CLINICAL DATA:  Code stroke. Acute onset of left-sided facial droop and abnormal gaze. EXAM: CT HEAD WITHOUT CONTRAST TECHNIQUE: Contiguous axial images were obtained from the base of the skull through the vertex without intravenous contrast. COMPARISON:  None. FINDINGS: Brain: A large right MCA infarct is noted. There is hypoattenuation involving the right caudate head, internal capsule, and lentiform nucleus. Hypoattenuation is seen at the insular ribbon, right superior temporal gyrus, and super ganglionic posterior right frontal lobe. No acute hemorrhage is present. No mass  effect scratched at there is some partial effacement of the sulci. No midline shift is present. Basal ganglia are normal on the left. The brainstem and cerebellum are within normal limits. The ventricles are of normal size. No significant extraaxial fluid collection is present. Vascular: A hyperdense  right MCA and ICA terminus is noted. Hyperdensity extends beyond the right MCA bifurcation. The left MCA is unremarkable. Atherosclerotic calcifications are present within the cavernous internal carotid arteries bilaterally. Skull: 1 Calvarium is intact. No focal lytic or blastic lesions are present. No significant extracranial soft tissue lesion is present. Sinuses/Orbits: The paranasal sinuses and mastoid air cells are clear. The globes and orbits are within normal limits. ASPECTS [facility] Stroke Program Early CT Score) - Ganglionic level infarction (caudate, lentiform nuclei, internal capsule, insula, M1-M3 cortex): 2/7 - Supraganglionic infarction (M4-M6 cortex): 2/3 Total score (0-10 with 10 being normal): 4/10 IMPRESSION: 1. Large right MCA territory nonhemorrhagic infarct is described. 2. Hyperdense right MCA suggesting extensive thrombus from the right ICA terminus through the MCA bifurcation 3. Some mass effect with partial effacement the sulci on the right. No midline shift. 4. ASPECTS is 4/10 The above was relayed via text pager to Dr. Roland Rack on 10/31/2019 at 17:59 . Electronically Signed   By: San Morelle M.D.   On: 10/31/2019 18:03   [place] EKG SITE RITE  Result Date: 11/01/2019 If Site Rite image not attached, placement could not be confirmed due to current cardiac rhythm.   Labs:  CBC: Recent Labs    10/31/19 1744 10/31/19 1805 11/01/19 0945 11/01/19 0945 11/02/19 0243 11/02/19 0848 11/02/19 1202 11/03/19 0408  WBC 5.7  --  13.8*  --  14.0*  --   --  15.3*  HGB 14.3   < > 14.5   < > 12.5 10.9* 11.2* 11.5*  HCT 43.1   < > 41.4   < > 36.7 32.0* 33.0* 35.6*  PLT 151  --  168  --  134*  --   --  123*   < > = values in this interval not displayed.    COAGS: Recent Labs    08/18/19 1101 10/31/19 1744 11/02/19 0243 11/03/19 0408  INR 1.9* 1.2 1.4* 1.3*  APTT  --  27  --   --     BMP: Recent Labs    10/31/19 1744 10/31/19 1744 10/31/19 1805  10/31/19 2120 11/01/19 0945 11/01/19 1515 11/02/19 0343 11/02/19 0343 11/02/19 0848 11/02/19 1037 11/02/19 1202 11/02/19 1549 11/02/19 2200 11/03/19 0408  NA 136   < > 138   < > 144   < > 147*   < > 152*   < > 151* 155* 157* 155*  K 4.6   < > 4.6   < > 3.4*  --  3.2*  --  2.8*  --  3.7  --   --  3.3*  CL 102   < > 103  --  110  --  115*  --   --   --   --   --   --  124*  CO2 24  --   --   --  24  --  24  --   --   --   --   --   --  23  GLUCOSE 214*   < > 208*  --  239*  --  173*  --   --   --   --   --   --  148*  BUN 11   < > 13  --  10  --  7*  --   --   --   --   --   --  14  CALCIUM 9.0  --   --   --  9.0  --  8.5*  --   --   --   --   --   --  8.6*  CREATININE 0.69   < > 0.50  --  0.72  --  0.58  --   --   --   --   --   --  0.70  GFRNONAA >60  --   --   --  >60  --  >60  --   --   --   --   --   --  >60  GFRAA >60  --   --   --  >60  --  >60  --   --   --   --   --   --  >60   < > = values in this interval not displayed.    LIVER FUNCTION TESTS: Recent Labs    08/18/19 1120 10/31/19 1744  BILITOT 0.6 1.1  AST 26 25  ALT 31 20  ALKPHOS 135* 92  PROT 7.1 7.4  ALBUMIN 4.4 4.1    Assessment and Plan:  History of acute CVA s/p cerebral arteriogram with emergent mechanical thrombectomy of right ICA terminus occlusion, right MCA occlusion, and right ACA occlusion achieving a TICI 3 revascularization 10/31/2019 by Dr. Karenann Cai. Post hemicraniotomy Stable on sedation/vent Further plans per neurology/neurosurgery- appreciate and agree with management. NIR to follow.   Electronically Signed: Ascencion Dike, PA-C 11/03/2019, 10:46 AM   I spent a total of 25 Minutes at the the patient's bedside AND on the patient's hospital floor or unit, greater than 50% of which was counseling/coordinating care for CVA s/p revascularization.

## 2019-11-03 NOTE — Progress Notes (Signed)
NAME:  Krystal Little, MRN:  [id number], DOB:  11-24-1955, LOS: 3 ADMISSION DATE:  10/31/2019, CONSULTATION DATE:  4/18 REFERRING MD:  Neurology service CHIEF COMPLAINT:  Vent dep post craniotomy    Brief History   21 yobf with hbp, dm, caf on coumadin with pacemaker found down/incontinent  p 2 h since last observed on pm 4/16> to [facility] ED as code stroke with R MCA > IR endovascular thrombectomy with revascularization with large area of edema post pocedure and progressive decrease LOC  > to OR am 4/18 for decompressive hemicraniectomy and return on vent so PCCM service asked to consult.  History of present illness   No independent hx obtainable  Past Medical History  HBP CAF on coumadin DM   Significant Hospital Events   4/16 IR thrombectomy  4/18 for decompressive hemicraniectomy  Consults:  [facility] Neurology PCCM   Procedures:  PIC LUE 4/17 > Oral et 4/18 > L RA art line 4/18 >  Significant Diagnostic Tests:  CT head 4/18 > Worsened edema within the right MCA territory with increased size. of right basal ganglia intraparenchymal hematoma. Increased amount of subarachnoid blood over the right hemisphere. 11 mm of leftward midline shift at the level of the foramen of Monro with early herniation of the right cingulate gyrus beneath the falx cerebri  Micro Data:  RVP 4/16 neg  MRSA PCR  4/17 neg   Antimicrobials:     Interim history/subjective:  Tolerating SBT very well. Has been following commands intermittently overnight. Remains on cleviprex.   Objective   Blood pressure 107/60, pulse (!) 103, temperature 98.7 F (37.1 C), temperature source Axillary, resp. rate 12, height 5\' 4"  (1.626 m), weight 63.6 kg, SpO2 100 %.    Vent Mode: CPAP;PSV FiO2 (%):  [30 %-60 %] 30 % Set Rate:  [15 bmp-115 bmp] 15 bmp Vt Set:  [430 mL] 430 mL PEEP:  [5 cmH20] 5 cmH20 Pressure Support:  [5 cmH20] 5 cmH20 Plateau Pressure:  [13 cmH20-17 cmH20] 17 cmH20   Intake/Output Summary (Last  24 hours) at 11/03/2019 0806 Last data filed at 11/03/2019 0600 Gross per 24 hour  Intake 3685.11 ml  Output 2105 ml  Net 1580.11 ml   Filed Weights   10/31/19 1907  Weight: 63.6 kg    Examination: General: middle aged female in NAD on vent HENT: Surgical dressing in place. ETT.  Lungs: Clear bilateral breath sounds. Unlabored on PSV Cardiovascular: IRIR, rate 100-110.  Abdomen: Soft, non-distended. Hypoactive.  Extremities: No acute deformity. ROM limited on L on WUA.    Resolved Hospital Problem list     Assessment & Plan:   Acute hypoxemic respiratory failure in setting of emergent hemicraniectomy for vasogenic cerebral edema p R MCA/ thrombectomy - Full vent support - SBT this morning - If her MS improves and OK with neuro she can be extubated.  - Fentanyl infusion for RASS goal 0 to -1.   Rapid afib in setting of CAF/ has pacemaker  - Remains rapid on tele with rates 100 - 110 - Continue lopressor - Add back home diltiazem   HTN - Cleviprex wean to off as able for SBP goal 100-140 mmHg  - Continue home lopressor - Add back home Dilt. 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  Pain/Anxiety/Delirium protocol (if indicated): Fentanyl drip VAP protocol (if indicated): n/a DVT prophylaxis:SCD  GI prophylaxis: ppi Glucose control: ssi Mobility: BR Code Status: full code Family Communication: per primary service Disposition:  ICU  Labs   CBC: Recent Labs  Lab 10/31/19 1744 10/31/19 1805 11/01/19 0945 11/02/19 0243 11/02/19 0848 11/02/19 1202 11/03/19 0408  WBC 5.7  --  13.8* 14.0*  --   --  15.3*  NEUTROABS 4.1  --   --   --   --   --   --   HGB 14.3   < > 14.5 12.5 10.9* 11.2* 11.5*  HCT 43.1   < >  41.4 36.7 32.0* 33.0* 35.6*  MCV 91.3  --  90.2 92.4  --   --  94.9  PLT 151  --  168 134*  --   --  123*   < > = values in this interval not displayed.    Basic Metabolic Panel: Recent Labs  Lab 10/31/19 1744 10/31/19 1744 10/31/19 1805 10/31/19 2120 11/01/19 0945 11/01/19 1515 11/02/19 0243 11/02/19 0243 11/02/19 0343 11/02/19 0343 11/02/19 0848 11/02/19 0848 11/02/19 1037 11/02/19 1202 11/02/19 1549 11/02/19 2200 11/03/19 0408  NA 136   < > 138   < > 144   < > 174*   < > 147*   < > 152*   < > 149* 151* 155* 157* 155*  K 4.6   < > 4.6   < > 3.4*  --   --   --  3.2*  --  2.8*  --   --  3.7  --   --  3.3*  CL 102  --  103  --  110  --   --   --  115*  --   --   --   --   --   --   --  124*  CO2 24  --   --   --  24  --   --   --  24  --   --   --   --   --   --   --  23  GLUCOSE 214*  --  208*  --  239*  --   --   --  173*  --   --   --   --   --   --   --  148*  BUN 11  --  13  --  10  --   --   --  7*  --   --   --   --   --   --   --  14  CREATININE 0.69  --  0.50  --  0.72  --   --   --  0.58  --   --   --   --   --   --   --  0.70  CALCIUM 9.0  --   --   --  9.0  --   --   --  8.5*  --   --   --   --   --   --   --  8.6*  MG  --   --   --   --   --   --  1.9  --   --   --   --   --   --   --   --   --   --    < > =  values in this interval not displayed.   GFR: Estimated Creatinine Clearance: 60.5 mL/min (by C-G formula based on SCr of 0.7 mg/dL). Recent Labs  Lab 10/31/19 1744 11/01/19 0945 11/02/19 0243 11/03/19 0408  WBC 5.7 13.8* 14.0* 15.3*    Liver Function Tests: Recent Labs  Lab 10/31/19 1744  AST 25  ALT 20  ALKPHOS 92  BILITOT 1.1  PROT 7.4  ALBUMIN 4.1   No results for input(s): LIPASE, AMYLASE in the last 168 hours. No results for input(s): AMMONIA in the last 168 hours.  ABG    Component Value Date/Time   PHART 7.375 11/02/2019 1202   PCO2ART 39.1 11/02/2019 1202   PO2ART 286.0 (H) 11/02/2019 1202   HCO3 22.9 11/02/2019 1202   TCO2  24 11/02/2019 1202   ACIDBASEDEF 2.0 11/02/2019 1202   O2SAT 100.0 11/02/2019 1202     Coagulation Profile: Recent Labs  Lab 10/31/19 1744 11/02/19 0243 11/03/19 0408  INR 1.2 1.4* 1.3*    Cardiac Enzymes: No results for input(s): CKTOTAL, CKMB, CKMBINDEX, TROPONINI in the last 168 hours.  HbA1C: HbA1c, POC (controlled diabetic range)  Date/Time Value Ref Range Status  08/18/2019 10:45 AM 12.2 (A) 0.0 - 7.0 % Final  02/04/2018 04:20 PM 8.0 (A) 0.0 - 7.0 % Final   Hgb A1c MFr Bld  Date/Time Value Ref Range Status  11/01/2019 03:48 AM 9.7 (H) 4.8 - 5.6 % Final    Comment:    (NOTE) Pre diabetes:          5.7%-6.4% Diabetes:              >6.4% Glycemic control for   <7.0% adults with diabetes     CBG: Recent Labs  Lab 11/02/19 1149 11/02/19 1545 11/02/19 2001 11/02/19 2325 11/03/19 0332  GLUCAP 189* 145* 114* 177* 143*    Critical care time: 35 minutes due to acute respiratory failure, cerebral edema.     Georgann Housekeeper, AGACNP-BC [place]  See Amion for personal pager PCCM on call pager [phone]  11/03/2019 8:25 AM

## 2019-11-03 NOTE — Evaluation (Signed)
Physical Therapy Evaluation Patient Details Name: Krystal Little MRN: [id number] DOB: 03/30/1956 Today's Date: 11/03/2019   History of Present Illness  Krystal Little is an 64 y.o. female  With PMH HTN, DM2, chronic a. Fib (coumadin) s/p pace maker who presented to [facility] ED as a code stroke for  Right gaze deviation and left side weakness. Pt s/p R hemicraniectomy with bone flap placed in abdomen on 4/18.    Clinical Impression  Pt admitted with above. Pt was indep PTA. Pt now very lethargic, on the vent, and with R craniectomy. Pt currently total Ax2 for all mobility as pt with minimal eye opening and no command follow at this time. Per RN pt was following commands earlier this morning however did not for therapy eval. Pt also with bleeding from craniectomy site, Dr. Erlinda Hong aware. Acute PT to cont to follow and re-assess d/c recommendations.    Follow Up Recommendations CIR(to continue to re-assess as pt progresses)    Equipment Recommendations  (TBD with progress)    Recommendations for Other Services Rehab consult     Precautions / Restrictions Precautions Precautions: Fall Precaution Comments: active bleeding from craniectomy site, on vent Restrictions Weight Bearing Restrictions: No      Mobility  Bed Mobility Overal bed mobility: Needs Assistance Bed Mobility: Rolling;Sidelying to Sit;Sit to Supine Rolling: Total assist;+2 for physical assistance Sidelying to sit: Total assist;+2 for physical assistance   Sit to supine: Total assist;+2 for physical assistance   General bed mobility comments: pt with no initiation or ability to help therapy, no eye opening  Transfers                 General transfer comment: not safe today  Ambulation/Gait             General Gait Details: not safe today  Stairs            Wheelchair Mobility    Modified Rankin (Stroke Patients Only) Modified Rankin (Stroke Patients Only) Pre-Morbid Rankin Score: No  symptoms Modified Rankin: Severe disability     Balance Overall balance assessment: Needs assistance Sitting-balance support: Feet supported;No upper extremity supported Sitting balance-Leahy Scale: Zero Sitting balance - Comments: pt dependent on physical assist                                     Pertinent Vitals/Pain Pain Assessment: Faces Faces Pain Scale: No hurt(did withdrawl to pain) Pain Location: x4 extremities    Home Living Family/patient expects to be discharged to:: Private residence Living Arrangements: Spouse/significant other;Children;Other relatives Available Help at Discharge: Family;Available 24 hours/day Type of Home: House Home Access: Stairs to enter   [employer] of Steps: 2 Home Layout: One level Home Equipment: None      Prior Function Level of Independence: Independent         Comments: ADLs, IADLs, driving, and enjoys playing basketball (favorite team [employer] and likes [employer] football).     Hand Dominance   Dominant Hand: Right    Extremity/Trunk Assessment   Upper Extremity Assessment Upper Extremity Assessment: Difficult to assess due to impaired cognition    Lower Extremity Assessment Lower Extremity Assessment: Difficult to assess due to impaired cognition(pt with bilat LE stiffness L worse than R)    Cervical / Trunk Assessment Cervical / Trunk Assessment: (R craniectomy)  Communication   Communication: No difficulties  Cognition Arousal/Alertness: Lethargic Behavior During Therapy:  Flat affect Overall Cognitive Status: Impaired/Different from baseline Area of Impairment: Following commands                       Following Commands: (pt with no command following)       General Comments: pt very lethargic today and unable to assess cognition accurately as pt with no eye opening today or command follow      General Comments General comments (skin integrity, edema, etc.): pt with  swelling on R side of head, active bleeding from incision, Dr. Erlinda Hong aware and to contact n/s    Exercises     Assessment/Plan    PT Assessment Patient needs continued PT services  PT Problem List Decreased strength;Decreased range of motion;Decreased activity tolerance;Decreased balance;Decreased mobility;Decreased coordination;Decreased cognition       PT Treatment Interventions DME instruction;Gait training;Stair training;Functional mobility training;Therapeutic activities;Balance training;Therapeutic exercise;Neuromuscular re-education;Cognitive remediation;Patient/family education    PT Goals (Current goals can be found in the Care Plan section)  Acute Rehab PT Goals PT Goal Formulation: With family Time For Goal Achievement: 11/17/19 Potential to Achieve Goals: Good    Frequency Min 3X/week   Barriers to discharge        Co-evaluation PT/OT/SLP Co-Evaluation/Treatment: Yes Reason for Co-Treatment: Complexity of the patient's impairments (multi-system involvement) PT goals addressed during session: Mobility/safety with mobility         AM-PAC PT "6 Clicks" Mobility  Outcome Measure Help needed turning from your back to your side while in a flat bed without using bedrails?: Total Help needed moving from lying on your back to sitting on the side of a flat bed without using bedrails?: Total Help needed moving to and from a bed to a chair (including a wheelchair)?: Total Help needed standing up from a chair using your arms (e.g., wheelchair or bedside chair)?: Total Help needed to walk in hospital room?: Total Help needed climbing 3-5 steps with a railing? : Total 6 Click Score: 6    End of Session Equipment Utilized During Treatment: (on vent) Activity Tolerance: Patient tolerated treatment well Patient left: in bed;with call bell/phone within reach;with bed alarm set;with family/visitor present Nurse Communication: Mobility status PT Visit Diagnosis: Difficulty in  walking, not elsewhere classified (R26.2)    Time: 9528-4132 PT Time Calculation (min) (ACUTE ONLY): 24 min   Charges:   PT Evaluation $PT Eval Moderate Complexity: 1 Mod          [name], PT, DPT Acute Rehabilitation Services Pager #: [phone] Office #: [phone]   [name] 11/03/2019, 2:04 PM

## 2019-11-03 NOTE — Evaluation (Signed)
Occupational Therapy Evaluation Patient Details Name: Krystal Little MRN: [id number] DOB: 04/17/1956 Today's Date: 11/03/2019    History of Present Illness 64 y.o. female presented to [facility] ED as a code stroke for  Right gaze deviation and left side weakness. Pt s/p R hemicraniectomy with bone flap placed in abdomen on 4/18.  PMH HTN, DM2, chronic a. Fib (coumadin) s/p pace maker.   Clinical Impression   PTA, pt was living with her husband, daughter, and nephew and was independent and working. Pt currently requiring Total A for ADLs and bed mobility. Pt presenting with decreased arousal, balance, strength, and activity tolerance. Pt with good family support and daughter providing home and PLOF information. Pt will require further acute OT to facilitate safe dc. Recommend dc to CIR for further OT to optimize safety, independence with ADLs, and return to PLOF.     Follow Up Recommendations  CIR;Supervision/Assistance - 24 hour    Equipment Recommendations  Other (comment)(defer to next venue)    Recommendations for Other Services PT consult;Rehab consult;Speech consult     Precautions / Restrictions Precautions Precautions: Fall Precaution Comments: Active bleeding from craniectomy site; RN aware. Intubated.  Restrictions Weight Bearing Restrictions: No      Mobility Bed Mobility Overal bed mobility: Needs Assistance Bed Mobility: Rolling;Sidelying to Sit;Sit to Supine Rolling: Total assist;+2 for physical assistance Sidelying to sit: Total assist;+2 for physical assistance   Sit to supine: Total assist;+2 for physical assistance   General bed mobility comments: pt with no initiation or ability to help therapy, no eye opening  Transfers                 General transfer comment: not safe today    Balance Overall balance assessment: Needs assistance Sitting-balance support: Feet supported;No upper extremity supported Sitting balance-Leahy Scale: Zero Sitting balance  - Comments: pt dependent on physical assist                                   ADL either performed or assessed with clinical judgement   ADL Overall ADL's : Needs assistance/impaired                                       General ADL Comments: Total A for ADLs and bed mobility. Decreased following commands, opening eyes, and active movement.     Vision Baseline Vision/History: Wears glasses Wears Glasses: At all times       Perception     Praxis      Pertinent Vitals/Pain Pain Assessment: Faces Faces Pain Scale: No hurt(did withdrawl to pain) Pain Location: x4 extremities Pain Intervention(s): Monitored during session     Hand Dominance Right   Extremity/Trunk Assessment Upper Extremity Assessment Upper Extremity Assessment: Difficult to assess due to impaired cognition   Lower Extremity Assessment Lower Extremity Assessment: Defer to PT evaluation   Cervical / Trunk Assessment Cervical / Trunk Assessment: Other exceptions(R craniectomy)   Communication Communication Communication: No difficulties   Cognition Arousal/Alertness: Lethargic Behavior During Therapy: Flat affect Overall Cognitive Status: Impaired/Different from baseline Area of Impairment: Following commands                       Following Commands: (pt with no command following)       General Comments: pt very lethargic today and  unable to assess cognition accurately as pt with no eye opening today or command follow   General Comments  pt with swelling on R side of head, active bleeding from incision, Dr. Erlinda Hong aware and to contact n/s. Daughter resent throughout    Exercises     Shoulder Instructions      Home Living Family/patient expects to be discharged to:: Private residence Living Arrangements: Spouse/significant other;Children;Other relatives Available Help at Discharge: Family;Available 24 hours/day Type of Home: House Home Access: Stairs to  enter [employer] of Steps: 2   Home Layout: One level     Bathroom Shower/Tub: Teacher, early years/pre: Standard     Home Equipment: None          Prior Functioning/Environment Level of Independence: Independent        Comments: ADLs, IADLs, driving, and enjoys playing basketball (favorite team [employer] basketball and likes [employer] football). Working at [employer] in Therapist, art.        OT Problem List: Decreased range of motion;Decreased strength;Decreased activity tolerance;Impaired balance (sitting and/or standing);Decreased cognition;Decreased knowledge of use of DME or AE;Decreased safety awareness;Decreased knowledge of precautions      OT Treatment/Interventions: Self-care/ADL training;Therapeutic exercise;Energy conservation;DME and/or AE instruction;Therapeutic activities;Patient/family education    OT Goals(Current goals can be found in the care plan section) Acute Rehab OT Goals Patient Stated Goal: Get better; per daughter OT Goal Formulation: With family Time For Goal Achievement: 11/17/19 Potential to Achieve Goals: Good  OT Frequency: Min 2X/week   Barriers to D/C:            Co-evaluation PT/OT/SLP Co-Evaluation/Treatment: Yes Reason for Co-Treatment: For patient/therapist safety;To address functional/ADL transfers PT goals addressed during session: Mobility/safety with mobility OT goals addressed during session: ADL's and self-care      AM-PAC OT "6 Clicks" Daily Activity     Outcome Measure Help from another person eating meals?: Total Help from another person taking care of personal grooming?: Total Help from another person toileting, which includes using toliet, bedpan, or urinal?: Total Help from another person bathing (including washing, rinsing, drying)?: Total Help from another person to put on and taking off regular upper body clothing?: Total Help from another person to put on and taking off regular lower  body clothing?: Total 6 Click Score: 6   End of Session Equipment Utilized During Treatment: Other (comment)(Vent) Nurse Communication: Mobility status  Activity Tolerance: Patient tolerated treatment well Patient left: with call bell/phone within reach;in bed;with bed alarm set  OT Visit Diagnosis: Unsteadiness on feet (R26.81);Other abnormalities of gait and mobility (R26.89);Muscle weakness (generalized) (M62.81)                Time: 9794-8016 OT Time Calculation (min): 30 min Charges:  OT General Charges $OT Visit: 1 Visit OT Evaluation $OT Eval High Complexity: 1 High  Mairin Lindsley MSOT, OTR/L Acute Rehab Pager: [phone] Office: [place] 11/03/2019, 2:36 PM

## 2019-11-03 NOTE — Social Work (Signed)
CSW was unable to complete sbirt due to pt being on vent. Please re consult at more appropriate time.   Chun Sellen, LCSWA, LCASA Clinical Social Worker [phone]  

## 2019-11-03 NOTE — Progress Notes (Signed)
  NEUROSURGERY PROGRESS NOTE   Patient is POD1 hemicraniectomy with bone flap placed in abdomen. I came by for a wound check this am. Abdomen is soft. Incision site looks good with minimal dried blood on bandage. Crani site is tense as expected. No active drainage.    No new NS recs. Continue care per [facility] and Neurology. Please call for any concerns.

## 2019-11-04 ENCOUNTER — Inpatient Hospital Stay (HOSPITAL_COMMUNITY): Payer: Medicare Other

## 2019-11-04 DIAGNOSIS — I482 Chronic atrial fibrillation, unspecified: Secondary | ICD-10-CM | POA: Diagnosis not present

## 2019-11-04 DIAGNOSIS — E876 Hypokalemia: Secondary | ICD-10-CM | POA: Diagnosis not present

## 2019-11-04 DIAGNOSIS — Z9889 Other specified postprocedural states: Secondary | ICD-10-CM

## 2019-11-04 DIAGNOSIS — G936 Cerebral edema: Secondary | ICD-10-CM

## 2019-11-04 DIAGNOSIS — I63411 Cerebral infarction due to embolism of right middle cerebral artery: Secondary | ICD-10-CM | POA: Diagnosis not present

## 2019-11-04 DIAGNOSIS — J9601 Acute respiratory failure with hypoxia: Secondary | ICD-10-CM | POA: Diagnosis not present

## 2019-11-04 DIAGNOSIS — Z9289 Personal history of other medical treatment: Secondary | ICD-10-CM

## 2019-11-04 LAB — BASIC METABOLIC PANEL
Anion gap: 9 (ref 5–15)
Anion gap: 6 (ref 5–15)
BUN: 23 mg/dL (ref 8–23)
BUN: 28 mg/dL — ABNORMAL HIGH (ref 8–23)
CO2: 22 mmol/L (ref 22–32)
CO2: 21 mmol/L — ABNORMAL LOW (ref 22–32)
Calcium: 8.7 mg/dL — ABNORMAL LOW (ref 8.9–10.3)
Calcium: 8.3 mg/dL — ABNORMAL LOW (ref 8.9–10.3)
Chloride: 125 mmol/L — ABNORMAL HIGH (ref 98–111)
Chloride: 126 mmol/L — ABNORMAL HIGH (ref 98–111)
Creatinine, Ser: 0.85 mg/dL (ref 0.44–1.00)
Creatinine, Ser: 0.83 mg/dL (ref 0.44–1.00)
GFR calc Af Amer: >60 mL/min (ref >60)
GFR calc Af Amer: >60 mL/min (ref >60)
GFR calc non Af Amer: >60 mL/min (ref >60)
GFR calc non Af Amer: >60 mL/min (ref >60)
Glucose, Bld: 249 mg/dL — ABNORMAL HIGH (ref 70–99)
Glucose, Bld: 313 mg/dL — ABNORMAL HIGH (ref 70–99)
Potassium: 3.2 mmol/L — ABNORMAL LOW (ref 3.5–5.1)
Potassium: 4.5 mmol/L (ref 3.5–5.1)
Sodium: 156 mmol/L — ABNORMAL HIGH (ref 135–145)
Sodium: 153 mmol/L — ABNORMAL HIGH (ref 135–145)

## 2019-11-04 LAB — CBC
HCT: 34.1 % — ABNORMAL LOW (ref 36.0–46.0)
Hemoglobin: 11.1 g/dL — ABNORMAL LOW (ref 12.0–15.0)
MCH: 30.9 pg (ref 26.0–34.0)
MCHC: 32.6 g/dL (ref 30.0–36.0)
MCV: 95 fL (ref 80.0–100.0)
Platelets: 109 10*3/uL — ABNORMAL LOW (ref 150–400)
RBC: 3.59 MIL/uL — ABNORMAL LOW (ref 3.87–5.11)
RDW: 14.3 % (ref 11.5–15.5)
WBC: 13.6 10*3/uL — ABNORMAL HIGH (ref 4.0–10.5)
nRBC: 0 % (ref 0.0–0.2)

## 2019-11-04 LAB — GLUCOSE, CAPILLARY
Glucose-Capillary: 221 mg/dL — ABNORMAL HIGH (ref 70–99)
Glucose-Capillary: 269 mg/dL — ABNORMAL HIGH (ref 70–99)
Glucose-Capillary: 334 mg/dL — ABNORMAL HIGH (ref 70–99)
Glucose-Capillary: 242 mg/dL — ABNORMAL HIGH (ref 70–99)
Glucose-Capillary: 231 mg/dL — ABNORMAL HIGH (ref 70–99)
Glucose-Capillary: 263 mg/dL — ABNORMAL HIGH (ref 70–99)

## 2019-11-04 LAB — SODIUM
Sodium: 155 mmol/L — ABNORMAL HIGH (ref 135–145)
Sodium: 154 mmol/L — ABNORMAL HIGH (ref 135–145)

## 2019-11-04 LAB — TRIGLYCERIDES: Triglycerides: 102 mg/dL (ref <150)

## 2019-11-04 MED ORDER — POLYETHYLENE GLYCOL 3350 17 G PO PACK
17.0000 g | PACK | Freq: Two times a day (BID) | ORAL | Status: DC
  Administered 2019-11-04 – 2019-11-20 (×14): 17 g
  Filled 2019-11-04 (×17): qty 1

## 2019-11-04 MED ORDER — SODIUM CHLORIDE 0.9 % IV BOLUS
500.0000 mL | Freq: Once | INTRAVENOUS | Status: AC
  Administered 2019-11-04: 500 mL via INTRAVENOUS

## 2019-11-04 MED ORDER — SENNOSIDES-DOCUSATE SODIUM 8.6-50 MG PO TABS
1.0000 | ORAL_TABLET | Freq: Two times a day (BID) | ORAL | Status: DC
  Administered 2019-11-04 – 2019-11-20 (×21): 1
  Filled 2019-11-04 (×20): qty 1

## 2019-11-04 MED ORDER — POTASSIUM CHLORIDE 20 MEQ/15ML (10%) PO SOLN
40.0000 meq | ORAL | Status: AC
  Administered 2019-11-04 (×3): 40 meq
  Filled 2019-11-04 (×3): qty 30

## 2019-11-04 MED ORDER — METOPROLOL TARTRATE 5 MG/5ML IV SOLN
2.5000 mg | Freq: Four times a day (QID) | INTRAVENOUS | Status: DC | PRN
  Administered 2019-11-05 – 2019-11-07 (×3): 2.5 mg via INTRAVENOUS
  Filled 2019-11-04 (×4): qty 5

## 2019-11-04 MED ORDER — OSMOLITE 1.2 CAL PO LIQD
1000.0000 mL | ORAL | Status: DC
  Administered 2019-11-04 – 2019-11-10 (×7): 1000 mL
  Filled 2019-11-04 (×10): qty 1000

## 2019-11-04 MED ORDER — INSULIN ASPART 100 UNIT/ML ~~LOC~~ SOLN
4.0000 [IU] | SUBCUTANEOUS | Status: DC
  Administered 2019-11-04 – 2019-11-05 (×5): 4 [IU] via SUBCUTANEOUS

## 2019-11-04 MED ORDER — INSULIN GLARGINE 100 UNIT/ML ~~LOC~~ SOLN
10.0000 [IU] | Freq: Every day | SUBCUTANEOUS | Status: DC
  Administered 2019-11-04 – 2019-11-05 (×2): 10 [IU] via SUBCUTANEOUS
  Filled 2019-11-04 (×2): qty 0.1

## 2019-11-04 NOTE — Progress Notes (Signed)
STROKE TEAM PROGRESS NOTE   INTERVAL HISTORY Husband and daughter at bedside.  Patient still intubated, more lethargic than yesterday, but still able to follow simple commands on the right side.  CT showed right large MCA with increased petechial hemorrhage conversion, still has significant midline shift.  OBJECTIVE Vitals:   11/04/19 0700 11/04/19 0800 11/04/19 0806 11/04/19 0900  BP: 121/84 119/75 (!) 194/70 114/75  Pulse: 73 77 98 87  Resp: (!) 22 (!) 22 (!) 28 (!) 21  Temp:  99.3 F (37.4 C)    TempSrc:      SpO2: 96% 95% 99% 93%  Weight:      Height:        CBC:  Recent Labs  Lab 10/31/19 1744 10/31/19 1805 11/03/19 0408 11/04/19 0400  WBC 5.7   < > 15.3* 13.6*  NEUTROABS 4.1  --   --   --   HGB 14.3   < > 11.5* 11.1*  HCT 43.1   < > 35.6* 34.1*  MCV 91.3   < > 94.9 95.0  PLT 151   < > 123* 109*   < > = values in this interval not displayed.    Basic Metabolic Panel:  Recent Labs  Lab 11/02/19 0243 11/02/19 0343 11/03/19 0408 11/03/19 1130 11/03/19 2212 11/04/19 0400  NA 174*   < > 155*   < > 157* 156*  K  --    < > 3.3*  --   --  3.2*  CL  --    < > 124*  --   --  125*  CO2  --    < > 23  --   --  22  GLUCOSE  --    < > 148*  --   --  249*  BUN  --    < > 14  --   --  23  CREATININE  --    < > 0.70  --   --  0.85  CALCIUM  --    < > 8.6*  --   --  8.7*  MG 1.9  --   --   --   --   --    < > = values in this interval not displayed.    Lipid Panel:     Component Value Date/Time   CHOL 239 (H) 11/01/2019 0348   CHOL 212 (H) 07/15/2018 0907   TRIG 102 11/04/2019 0639   HDL 69 11/01/2019 0348   HDL 50 07/15/2018 0907   CHOLHDL 3.5 11/01/2019 0348   VLDL 12 11/01/2019 0348   LDLCALC 158 (H) 11/01/2019 0348   LDLCALC 138 (H) 07/15/2018 0907   HgbA1c:  Lab Results  Component Value Date   HGBA1C 9.7 (H) 11/01/2019   Urine Drug Screen:     Component Value Date/Time   LABOPIA NONE DETECTED 10/31/2019 1820   COCAINSCRNUR NONE DETECTED 10/31/2019  1820   LABBENZ NONE DETECTED 10/31/2019 1820   AMPHETMU NONE DETECTED 10/31/2019 1820   THCU NONE DETECTED 10/31/2019 1820   LABBARB NONE DETECTED 10/31/2019 1820    Alcohol Level     Component Value Date/Time   ETH <10 10/31/2019 1744    IMAGING CT HEAD CODE STROKE WO CONTRAST 10/31/2019 IMPRESSION:  1. Large right MCA territory nonhemorrhagic infarct is described.  2. Hyperdense right MCA suggesting extensive thrombus from the right ICA terminus through the MCA bifurcation  3. Some mass effect with partial effacement the sulci on the right. No midline  shift.  4. ASPECTS is 4/10   CT Code Stroke CTA Head W/WO contrast CT Code Stroke CTA Neck W/WO contrast CT Code Stroke Cerebral Perfusion with contrast 10/31/2019 IMPRESSION:  1. Confirmed large right MCA territory infarct with core volume of 107 mL.  2. Large mismatch volume of 9.6 with a mismatch ratio of 1.9.  3. T occlusion of right ICA terminus.  4. Thrombus extends through the right MCA bifurcation with poor collaterals.  5. Minimal atherosclerotic changes at the carotid bifurcations bilaterally without significant stenosis.  6. No significant intracranial disease on the left or in the posterior circulation.  7. Fetal type right posterior cerebral artery originates just below the terminal ICA thrombus.   Interventional Neuro Radiology - Cerebral Angiogram with Intervention - de Rosario Jacks, MD 10/31/2019 8:20 PM ICA terminus occlusion. Mechanical thrombectomy performed with 2 stent retriever + aspiration passes in the MCA-ICA (solitaire and embotrap), 1 stent retriever + aspiration pass MCA (embotrap) and 1 direct contact aspiration in the right ACA. Complete recanalization achieved (TICI3).  CT HEAD WO CONTRAST 11/01/2019 IMPRESSION:  1. Large area of hypoattenuation within the right MCA territory, consistent with subacute infarct.  2. Multifocal hyperdensity within the right MCA territory is consistent  with hemorrhage or contrast staining. The more peripheral hyperdensities are favored to be hemorrhagic.  3. 6 mm leftward midline shift at the level of the foramina of Monro.   CT Head WO Contrast 11/02/2019 3:30 AM IMPRESSION: 1. Worsened edema within the right MCA territory with increased size of right basal ganglia intraparenchymal hematoma. 2. Increased amount of subarachnoid blood over the right hemisphere. 3. 11 mm of leftward midline shift at the level of the foramen of Monro with early herniation of the right cingulate gyrus beneath the falx cerebri  CT Head WO Contrast 11/04/2019  1. Postoperative sequela from interval decompressive right hemicraniectomy. 2. Continued interval evolution of a large right MCA vascular territory subacute infarct with continued interval increase in edema and swelling. Petechial hemorrhage throughout the infarct territory has increased. Small volume subarachnoid hemorrhage overlying the right cerebral hemisphere has also slightly increased. Parenchymal hemorrhage centered within the right basal ganglia is similar to slightly increased as compared to 11/02/2019. 3. Similar degree of mass effect with partial effacement of the ventricular system and 11 mm leftward midline shift. As before, there is early leftward subfalcine herniation and persistent partial effacement of the right basal cisterns.  DG Chest Port 1 View 11/04/2019 1. Complete opacification of the retrocardiac left lung base worrisome for left lower lobe atelectasis. 2. The left arm PICC line tip is at the cavoatrial junction.  11/03/2019 Stable support apparatus. No acute cardiopulmonary abnormality seen.  11/02/2019 1. Well-positioned support structures. No pneumothorax. 2. Stable mild cardiomegaly without overt pulmonary edema. No active pulmonary disease.  11/01/2019 1. Well-positioned left PICC with tip overlying the cavoatrial junction. 2. Borderline mild cardiomegaly. No pulmonary edema. No  active pulmonary disease.  11/01/2019 1. Malpositioned left PICC with tip oriented superiorly in the high right mediastinum, recommend repositioning. 2. No active cardiopulmonary disease.  10/31/2019 Mild cardiomegaly. No acute chest findings.    CUP PACEART INCLINIC DEVICE CHECK  10/30/2019 Pacemaker check in clinic. Normal device function. Thresholds, sensing, impedances consistent with previous measurements. Device programmed to maximize longevity. No high ventricular rates noted. Device programmed at appropriate safety margins. Histogram  distribution appropriate for patient activity level. Device programmed to optimize intrinsic conduction. Estimated longevity4 years. Patient enrolled in remote follow-up. Patient education completed. ROV  w/ Oren Binet, BSN, RN  ECHOCARDIOGRAM COMPLETE 11/02/2019 1. Left ventricular ejection fraction, by estimation, is 50 to 55%. The left ventricle has low normal function. There is moderate left ventricular hypertrophy. Left ventricular diastolic parameters are indeterminate.  2. Right ventricular systolic function is moderately reduced. The right ventricular size is normal.  3. Left atrial size was moderately dilated.  4. The mitral valve has been repaired. There is a prosthetic annuloplasty ring present in the mitral position. No evidence of mitral valve regurgitation. The mean mitral valve gradient is 4.0 mmHg.  5. Tricuspid valve regurgitation is moderate.  6. The aortic valve was not well visualized. Aortic valve regurgitation is not visualized. No aortic stenosis is present.    PHYSICAL EXAM   Temp:  [98.8 F (37.1 C)-101.4 F (38.6 C)] 99.3 F (37.4 C) (04/20 0800) Pulse Rate:  [66-126] 87 (04/20 0900) Resp:  [14-30] 21 (04/20 0900) BP: (96-194)/(37-84) 114/75 (04/20 0900) SpO2:  [87 %-100 %] 93 % (04/20 0900) Arterial Line BP: (114-165)/(45-73) 128/53 (04/20 0900) FiO2 (%):  [30 %-50 %] 50 % (04/20 0901)  General - Well nourished, well  developed, intubated on fentanyl.  Ophthalmologic - fundi not visualized due to noncooperation.  Cardiovascular - irregularly irregular heart rate and rhythm with RVR.  Neuro - intubated on fentanyl, eyes closed, barely open with repetitive voice stimulation. Right eyelid swollen. Still able to follow simple commands on the right hand and foot, but slow respond. With eye forced opening, eyes in right gaze position, not blinking to visual threat, doll's eyes sluggish, not tracking, right pupil 65mm, left 2.89mm, sluggish to light. Corneal reflex, gag and cough present. Breathing over the vent.  Facial symmetry not able to test due to ET tube.  Tongue protrusion not cooperative. On pain stimulation, RUE move against gravity, able to follow command on right hand with repetitive verbal commands, LUE flaccid, RLEs mild withdraw to pain, LLE not significant movement with pain. DTR 1+ and bilateral babinski. Sensation, coordination and gait not tested.   ASSESSMENT/PLAN Krystal Little is a 65 y.o. female with history of HTN, DM2, hx of mitral valve repair, chronic a. Fib (coumadin - INR 1.2 on admission) s/p pacemaker who presented to [facility] ED as a code stroke for right gaze deviation, urinary incontinence and left sided weakness. She did not receive IV t-PA due to anticoagulation with coumadin. IR -> ICA terminus occlusion. Mechanical thrombectomy performed with 2 stent retriever + aspiration passes in the MCA-ICA (solitaire and embotrap), 1 stent retriever + aspiration pass MCA (embotrap) and 1 direct contact aspiration in the right ACA. Complete recanalization achieved (TICI3).  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 Hyperdense right MCA suggesting extensive thrombus from the right ICA  terminus through the MCA bifurcation Some mass effect with partial effacement the sulci on the right. No midline shift. ASPECTS is 4/10   CTA H&N - Total occlusion of right ICA terminus. Thrombus extends through the right MCA bifurcation with poor collaterals.   CT Perfusion - Confirmed large right MCA territory infarct with core volume of 107 mL. Large mismatch volume of 9.6 with a mismatch ratio of 1.9.   CT head -  Large area of hypoattenuation within the right MCA territory, consistent with subacute infarct. Multifocal hyperdensity within the right MCA  territory is consistent with hemorrhage or contrast staining. The more peripheral hyperdensities are favored to be hemorrhagic. 6 mm leftward midline shift  CT Head 11/02/19 - Worsened edema within the right MCA territory with increased size of right basal ganglia intraparenchymal hematoma. Increased amount of subarachnoid blood over the right hemisphere. 11 mm of leftward midline shift at the level of the foramen of Monro with early herniation of the right cingulate gyrus beneath the falx cerebri.  MRI head - not performed - PPM   CT head 4/20 postop changed from R crani. Evolution large R MCA infarct w/ interval increase edema/swelling. Increased petechial hemorrhage throughout. SAH also increased. R BG HT slightly increased. Mass effect, partial effacement ventricles w/ 44mm L midline shift w/ subfalcine herniation and partial effacement R basal cistern.  2D Echo - EF 50-55%. No source of embolus. 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   [employer] Virus 2 - negative  LDL - 158  HgbA1c - 9.7  UDS - negative  VTE prophylaxis - SCDs  warfarin daily prior to admission, now on No antithrombotic  Therapy recommendations:  CIR - admission coordinator following  Disposition:  Pending  Acute hypoxemia respiratory failure d/t stroke  Intubated for emergent hemicraniectomy for cerebral edema  Plan extubation when MS  improves  CCM on board  On fentanyl  Cerebral edema w/ subfalcine herniation s/p decompressive hemicraniectomy  CT repeat showed 6 mm Midline Shift -> 11 mm  3% saline @ 50 cc/hr -> NS @ 50  Na 138->141->148->147->157->155->153->157->156->153  NSG on board s/p [facility] 4/18  Sodium goal 150-160  Sodium every 6 hours  PICC line placed  23.4% x 2  Atrial fibrillation w/ RVR  Telemetry showed A. fib, rate controlled  On Coumadin PTA, family stated compliance  Subtherapeutic INR 1.2 on presentation -> 1.4  No AC at the time due to large infarct and hemorrhagic conversion  On metoprolol 25 bid->50 bid -> d/c due to low BP  On metoprolol prn HR > 120  On cardizem 60 q 8h  Hypertension  Home BP meds: Atenolol; Cardizem ; lisinopril  Current BP meds: Cleviprex, cardizem  Metoprolol and lisinopril d/c due to low BP  Stable on the low end . SBP goal < 160 mm Hg . Long-term BP goal normotensive  Hyperlipidemia  Home Lipid lowering medication: zetia  LDL 158, goal < 70  Hold off statin for know due to left hemorrhagic conversion  Consider statin at discharge  Diabetes, uncontrolled  Home diabetic meds: metformin  Current diabetic meds: lantus 10, SSI   HgbA1c 9.7, goal < 7.0  CBG monitoring  Hyperglycemia - add lantus 10  Continue SSI  DM coordinator consulted  Dysphagia  N.p.o. for now  OG placed  On TF @ 30 -> 60  Speech following  Oligouria   Low urine output 4/20  Likely due to low BP  Give NS bolus 500  D/c metoprolol and lisinopril   BP monitoring with cuff pressure  Other Stroke Risk Factors  Advanced age  ETOH use, advised to drink no more than 1 alcoholic beverage per day.  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 fib, dysphagia and at significant risk of neurological  worsening, death form brain herniation, cerebral edema, recurrent stroke, hematoma expansion, heart failure, aspiration pneumonia, shock. This patient's care requires constant monitoring of vital signs, hemodynamics, respiratory and cardiac monitoring, review of multiple databases, neurological assessment, discussion with family, other specialists and medical decision making of high complexity. I spent 40 minutes of neurocritical care time in the care of this patient. I had long discussion with husband and daughter at bedside, updated pt current condition, treatment plan and potential prognosis, and answered all the questions.  They expressed understanding and appreciation. I also discussed with NSG PA.   Rosalin Hawking, MD PhD Stroke Neurology 11/04/2019 10:23 AM   To contact Stroke Continuity provider, please refer to [email or web address] After hours, contact General Neurology

## 2019-11-04 NOTE — Progress Notes (Signed)
Inpatient Diabetes Program Recommendations  AACE/ADA: New Consensus Statement on Inpatient Glycemic Control (2015)  Target Ranges:  Prepandial:   less than 140 mg/dL      Peak postprandial:   less than 180 mg/dL (1-2 hours)      Critically ill patients:  140 - 180 mg/dL   Lab Results  Component Value Date   GLUCAP 334 (H) 11/04/2019   HGBA1C 9.7 (H) 11/01/2019    Review of Glycemic Control Results for Krystal Little, Krystal Little (MRN [id number]) as of 11/04/2019 12:57  Ref. Range 11/03/2019 19:41 11/03/2019 23:20 11/04/2019 03:29 11/04/2019 08:41 11/04/2019 11:19  Glucose-Capillary Latest Ref Range: 70 - 99 mg/dL 221 (H) 227 (H) 221 (H) 269 (H) 334 (H)   Diabetes history: DM 2 Outpatient Diabetes medications:  Metformin 1000 mg bid Current orders for Inpatient glycemic control:  Novolog moderate q 4 hours Lantus 10 units daily (just added) Vital 25 cc/hr Inpatient Diabetes Program Recommendations:    Note blood sugars increased.  May also consider adding Novolog meal coverage 2 units q 4 hours.    Thanks  Adah Perl, RN, BC-ADM Inpatient Diabetes Coordinator Pager [phone] (8a-5p)

## 2019-11-04 NOTE — Progress Notes (Signed)
Initial Nutrition Assessment  DOCUMENTATION CODES:   Not applicable  INTERVENTION:   Transition to Osmolite 1.2 @ 60 ml/hr (1440 ml/day) via OG tube  Provides: 1728 kcal, 80 grams protein, and 1167 ml free water.    NUTRITION DIAGNOSIS:   Inadequate oral intake related to inability to eat as evidenced by NPO status.  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   4/18 s/p emergent decompressive hemicraniectomy due to vasogenic cerebral edema   Pt discussed during ICU rounds and with RN.  Husband at bedside and reports no recent weight changes and good appetite PTA. States pt works from home and has been walking in the neighborhood for exercise.   Patient is currently intubated on ventilator support MV: 10.9 L/min Temp (24hrs), Avg:99.7 F (37.6 C), Min:99 F (37.2 C), Max:101.4 F (38.6 C)  Propofol: off  Cleviprex now off Medications reviewed and include: SSI, 10 unit lantus daily, 40 mEq KCl every 4 hours x 4  Labs reviewed: Na 156 (H) CBG's: 221-269   TF: Vital High Protein @ 25 ml/hr with 30 ml Prostat BID Provides: 800 kcal and 52 grams protein  NUTRITION - FOCUSED PHYSICAL EXAM:    Most Recent Value  Orbital Region  No depletion  Upper Arm Region  No depletion  Thoracic and Lumbar Region  No depletion  Buccal Region  No depletion  Temple Region  No depletion  Clavicle Bone Region  No depletion  Clavicle and Acromion Bone Region  No depletion  Scapular Bone Region  No depletion  Dorsal Hand  No depletion  Patellar Region  No depletion  Anterior Thigh Region  No depletion  Posterior Calf Region  No depletion  Edema (RD Assessment)  None  Hair  Reviewed  Eyes  Unable to assess   Mouth  Unable to assess  Skin  Reviewed  Nails  Reviewed       Diet Order:   Diet Order            Diet NPO time specified  Diet effective now              EDUCATION NEEDS:   No education needs have been identified at this time  Skin:  Skin Assessment: Reviewed RN Assessment  Last BM:  unknown  Height:   Ht Readings from Last 1 Encounters:  10/31/19 5\' 4"  (1.626 m)    Weight:   Wt Readings from Last 1 Encounters:  10/31/19 63.6 kg    Ideal Body Weight:  54.5 kg  BMI:  Body mass index is 24.07 kg/m.  Estimated Nutritional Needs:   Kcal:  1025  Protein:  76-85 grams  Fluid:  >1.6 L/day  Lockie Pares., RD, LDN, CNSC See AMiON for contact information

## 2019-11-04 NOTE — Progress Notes (Addendum)
Referring Physician(s): Code Stroke- Greta Doom  Supervising Physician: Pedro Earls  Patient Status:  [facility] - In-pt  Chief Complaint: None- intubated with sedation  Subjective:  History of acute CVA s/p cerebral arteriogram with emergent mechanical thrombectomy of right ICA terminus occlusion, right MCA occlusion, and right ACA occlusion achieving a TICI 3 revascularization 10/31/2019 by Dr. Karenann Cai. Patient laying in bed intubated with sedation. She does not respond to voice or painful stimuli. [name]. No spontaneous movements of extremities. Right femoral incision site c/d/i.   Allergies: Patient has no known allergies.  Medications: Prior to Admission medications   Medication Sig Start Date End Date Taking? Authorizing Provider  acetaminophen (TYLENOL) 500 MG tablet Take 500-1,000 mg by mouth every 6 (six) hours as needed for headache (pain).   Yes [provider]  atenolol (TENORMIN) 50 MG tablet TAKE 1 TABLET(50 MG) BY MOUTH DAILY Patient taking differently: Take 50 mg by mouth daily.  09/26/19  Yes Leonie Man, MD  CHROMIUM PO Take 1 tablet by mouth daily.   Yes [provider]  diltiazem (CARDIZEM CD) 180 MG 24 hr capsule TAKE 1 CAPSULE(180 MG) BY MOUTH DAILY Patient taking differently: Take 180 mg by mouth daily.  09/26/19  Yes Leonie Man, MD  ezetimibe (ZETIA) 10 MG tablet Take 10 mg by mouth daily.   Yes [provider]  Gymnema Sylvestris Leaf POWD Take 1 capsule by mouth daily. "sugar destroyer"   Yes [provider]  lisinopril (ZESTRIL) 5 MG tablet TAKE 1 TABLET(5 MG) BY MOUTH DAILY Patient taking differently: Take 5 mg by mouth daily.  08/26/19  Yes Leonie Man, MD  metFORMIN (GLUCOPHAGE) 1000 MG tablet Take 1,000 mg by mouth 2 (two) times daily. 09/12/19  Yes [provider]  Multiple Vitamin (MULTIVITAMIN WITH MINERALS) TABS tablet Take 1 tablet by mouth  daily.   Yes [provider]  warfarin (COUMADIN) 5 MG tablet TAKE 1 TABLET BY MOUTH DAILY EXCEPT TAKE 1 AND 1/2 TABLET ON MONDAY AND FRIDAY Patient taking differently: Take 5 mg by mouth See admin instructions. Take 1 1/2 tablets (7.5 mg) by mouth on Monday and Friday, take 1 tablet (5 mg) on all other days of the week 08/05/18  Yes Bufford Lope, DO     Vital Signs: BP 114/75   Pulse 87   Temp 99.3 F (37.4 C)   Resp (!) 21   Ht _0  (1.626 m)   Wt 140 lb 3.4 oz (63.6 kg)   SpO2 93%   BMI 24.07 kg/m   Physical Exam Vitals and nursing note reviewed.  Constitutional:      General: She is not in acute distress.    Comments: Intubated with sedation.  Pulmonary:     Effort: Pulmonary effort is normal. No respiratory distress.     Comments: Intubated with sedation. Skin:    General: Skin is warm and dry.     Comments: Right groin incision soft without active bleeding or hematoma.   Neurological:     Comments: Intubated with sedation. She does not respond to voice or painful stimuli. No spontaneous movements of extremities. Distal pulses (DPs) 2+ bilaterally.     Imaging: CT Code Stroke CTA Head W/WO contrast  Result Date: 10/31/2019 CLINICAL DATA:  Last seen normal 5 hours ago. New onset left-sided weakness. Left facial droop and gaze. EXAM: CT ANGIOGRAPHY HEAD AND NECK CT PERFUSION BRAIN TECHNIQUE: Multidetector CT imaging  of the head and neck was performed using the standard protocol during bolus administration of intravenous contrast. Multiplanar CT image reconstructions and MIPs were obtained to evaluate the vascular anatomy. Carotid stenosis measurements (when applicable) are obtained utilizing NASCET criteria, using the distal internal carotid diameter as the denominator. Multiphase CT imaging of the brain was performed following IV bolus contrast injection. Subsequent parametric perfusion maps were calculated using RAPID software. CONTRAST:  131m OMNIPAQUE IOHEXOL  350 MG/ML SOLN COMPARISON:  CT head without contrast 10/31/2019. FINDINGS: CTA NECK FINDINGS Aortic arch: A 4 vessel arch configuration is present. Left vertebral artery originates directly from the arch. No significant atherosclerotic calcification or stenosis is present. Right carotid system: The right common carotid artery is within normal limits. Atherosclerotic changes are present at the proximal right ICA without a significant stenosis. Cervical right ICA is otherwise. Left carotid system: The left common carotid artery is within normal limits. Minimal atherosclerotic changes are present without significant stenosis. Cervical left ICA is otherwise normal. Vertebral arteries: Right vertebral artery originates from the subclavian artery without significant stenosis. It is the dominant vessel. No significant stenosis is present in either vertebral artery in the neck. Skeleton: Mild degenerative changes are present at C5-6. Uncovertebral spurring is present the right C4-5. No focal lytic or blastic lesions are present. Other neck: The soft tissues the neck are otherwise unremarkable. Upper chest: The lung apices are clear. Thoracic inlet is within normal limits. Review of the MIP images confirms the above findings CTA HEAD FINDINGS Anterior circulation: The right internal carotid artery is occluded at the terminus. Clot extends beyond the right MCA bifurcation. Anterior branches opacify. Poor collateralization is evident posteriorly. The left internal carotid artery is within normal limits through the terminus. Left A1 and M1 segments are normal. Anterior communicating artery is patent. Both ACA vessels fill from the left. Retrograde right A1 flow does not reach the ICA terminus secondary to the T occlusion. Left MCA bifurcation and branch vessels are within normal limits. Posterior circulation: The right vertebral artery is dominant vessel. PICA origins are visualized and within normal limits. Basilar artery is  normal. The left posterior cerebral artery originates from the basilar tip. The right posterior cerebral artery is of fetal type is fed from just below the thrombus. PCA branch vessels are within normal limits bilaterally. Venous sinuses: The dural sinuses are patent. Straight sinus deep cerebral veins are intact. Cortical veins are within normal limits. No vascular malformation is present. Anatomic variants: Fetal type right posterior cerebral artery. Review of the MIP images confirms the above findings CT Brain Perfusion Findings: ASPECTS: 4/10 CBF (<30%) Volume: 1026mPerfusion (Tmax>6.0s) volume: 20353mismatch Volume: 2m30mfarction Location:Right MCA territory IMPRESSION: 1. Confirmed large right MCA territory infarct with core volume of 107 mL. 2. Large mismatch volume of 9.6 with a mismatch ratio of 1.9. 3. T occlusion of right ICA terminus. 4. Thrombus extends through the right MCA bifurcation with poor collaterals. 5. Minimal atherosclerotic changes at the carotid bifurcations bilaterally without significant stenosis. 6. No significant intracranial disease on the left or in the posterior circulation. 7. Fetal type right posterior cerebral artery originates just below the terminal ICA thrombus. Electronically Signed   By: ChriSan Morelle.   On: 10/31/2019 18:17   CT HEAD WO CONTRAST  Addendum Date: 11/04/2019   ADDENDUM REPORT: 11/04/2019 08:31 ADDENDUM: These results were called by telephone at the time of interpretation on 11/04/2019 at 8:31 am to provider [facility] verbally acknowledged  these results. Electronically Signed   By: Kellie Simmering DO   On: 11/04/2019 08:31   Result Date: 11/04/2019 CLINICAL DATA:  Stroke, follow-up. EXAM: CT HEAD WITHOUT CONTRAST TECHNIQUE: Contiguous axial images were obtained from the base of the skull through the vertex without intravenous contrast. COMPARISON:  Head CT 11/02/2019. FINDINGS: Brain: Interval decompressive right hemicraniectomy.  Continued interval evolution of a large subacute right MCA vascular territory infarct. Continued interval increase in edema and swelling throughout much of the right MCA vascular territory. There is increased petechial hemorrhage within the infarct territory. Additionally, small volume subarachnoid hemorrhage overlying the right cerebral hemisphere has slightly increased, most notably within the right sylvian fissure. Parenchymal hemorrhage within the right basal ganglia is similar to minimally increased as compared to 11/02/2019. The largest parenchymal hematoma centered within the right lentiform nucleus now measures 4.6 x 2.2 cm (previously 4.5 x 2.0 cm). Similar degree of mass effect with partial effacement of the right greater than left lateral ventricles and third ventricle. Persistent 11 mm leftward midline shift. As before, there is early leftward subfalcine herniation. Persistent partial effacement of the right basal cisterns. No new demarcated cortical infarct is identified. No definite ventricular entrapment on the current exam. Small volume pneumocephalus along the right temporal lobe. Vascular: No hyperdense vessel. Skull: Right hemicraniectomy. Sinuses/Orbits: Visualized orbits demonstrate no acute abnormality. Mild scattered paranasal sinus mucosal thickening. No significant mastoid effusion. IMPRESSION: 1. Postoperative sequela from interval decompressive right hemicraniectomy. 2. Continued interval evolution of a large right MCA vascular territory subacute infarct with continued interval increase in edema and swelling. Petechial hemorrhage throughout the infarct territory has increased. Small volume subarachnoid hemorrhage overlying the right cerebral hemisphere has also slightly increased. Parenchymal hemorrhage centered within the right basal ganglia is similar to slightly increased as compared to 11/02/2019. 3. Similar degree of mass effect with partial effacement of the ventricular system and 11  mm leftward midline shift. As before, there is early leftward subfalcine herniation and persistent partial effacement of the right basal cisterns. Electronically Signed: By: Kellie Simmering DO On: 11/04/2019 07:41   CT HEAD WO CONTRAST  Result Date: 11/02/2019 CLINICAL DATA:  Stroke follow-up EXAM: CT HEAD WITHOUT CONTRAST TECHNIQUE: Contiguous axial images were obtained from the base of the skull through the vertex without intravenous contrast. COMPARISON:  None. FINDINGS: Brain: Worsened edema within the right MCA territory with increased size of hyperdense collection in the right basal ganglia consistent with worsening hemorrhage. The intraparenchymal hematoma now measures 4.5 x 2.0 cm. There is 11 mm of leftward midline shift at the level of the foramen of Monro. There is early herniation of the right cingulate gyrus beneath the falx cerebri. The right lateral ventricle is effaced. No ventricular entrapment at this time. Basal cisterns remain patent. Increased amount of subarachnoid blood over the right hemisphere. Vascular: No abnormal hyperdensity of the major intracranial arteries or dural venous sinuses. No intracranial atherosclerosis. Skull: The visualized skull base, calvarium and extracranial soft tissues are normal. Sinuses/Orbits: No fluid levels or advanced mucosal thickening of the visualized paranasal sinuses. No mastoid or middle ear effusion. The orbits are normal. IMPRESSION: 1. Worsened edema within the right MCA territory with increased size of right basal ganglia intraparenchymal hematoma. 2. Increased amount of subarachnoid blood over the right hemisphere. 3. 11 mm of leftward midline shift at the level of the foramen of Monro with early herniation of the right cingulate gyrus beneath the falx cerebri Critical Value/emergent results were called by telephone at the  time of interpretation on 11/02/2019 at 3:31 am to provider [facility] , who verbally acknowledged these results. Electronically  Signed   By: Ulyses Jarred M.D.   On: 11/02/2019 03:30   CT HEAD WO CONTRAST  Result Date: 11/01/2019 CLINICAL DATA:  Stroke follow-up EXAM: CT HEAD WITHOUT CONTRAST TECHNIQUE: Contiguous axial images were obtained from the base of the skull through the vertex without intravenous contrast. COMPARISON:  CT perfusion scan 10/31/2019 FINDINGS: Brain: Large area of hypoattenuation within the right MCA territory. The right lateral ventricle is compressed. There is leftward midline shift that measures 6 mm at the level of the foramina of Monro. Multifocal hyperdensity within the right MCA territory is consistent with hemorrhage or contrast staining. The more peripheral hyperdensities are favored to be hemorrhagic. The largest of these measures 6 mm. Vascular: No hyperdense vessel or unexpected calcification. Skull: Normal. Negative for fracture or focal lesion. Sinuses/Orbits: No acute finding. Other: None. IMPRESSION: 1. Large area of hypoattenuation within the right MCA territory, consistent with subacute infarct. 2. Multifocal hyperdensity within the right MCA territory is consistent with hemorrhage or contrast staining. The more peripheral hyperdensities are favored to be hemorrhagic. 3. 6 mm leftward midline shift at the level of the foramina of Monro. Electronically Signed   By: Ulyses Jarred M.D.   On: 11/01/2019 02:45   CT Code Stroke CTA Neck W/WO contrast  Result Date: 10/31/2019 CLINICAL DATA:  Last seen normal 5 hours ago. New onset left-sided weakness. Left facial droop and gaze. EXAM: CT ANGIOGRAPHY HEAD AND NECK CT PERFUSION BRAIN TECHNIQUE: Multidetector CT imaging of the head and neck was performed using the standard protocol during bolus administration of intravenous contrast. Multiplanar CT image reconstructions and MIPs were obtained to evaluate the vascular anatomy. Carotid stenosis measurements (when applicable) are obtained utilizing NASCET criteria, using the distal internal carotid  diameter as the denominator. Multiphase CT imaging of the brain was performed following IV bolus contrast injection. Subsequent parametric perfusion maps were calculated using RAPID software. CONTRAST:  163m OMNIPAQUE IOHEXOL 350 MG/ML SOLN COMPARISON:  CT head without contrast 10/31/2019. FINDINGS: CTA NECK FINDINGS Aortic arch: A 4 vessel arch configuration is present. Left vertebral artery originates directly from the arch. No significant atherosclerotic calcification or stenosis is present. Right carotid system: The right common carotid artery is within normal limits. Atherosclerotic changes are present at the proximal right ICA without a significant stenosis. Cervical right ICA is otherwise. Left carotid system: The left common carotid artery is within normal limits. Minimal atherosclerotic changes are present without significant stenosis. Cervical left ICA is otherwise normal. Vertebral arteries: Right vertebral artery originates from the subclavian artery without significant stenosis. It is the dominant vessel. No significant stenosis is present in either vertebral artery in the neck. Skeleton: Mild degenerative changes are present at C5-6. Uncovertebral spurring is present the right C4-5. No focal lytic or blastic lesions are present. Other neck: The soft tissues the neck are otherwise unremarkable. Upper chest: The lung apices are clear. Thoracic inlet is within normal limits. Review of the MIP images confirms the above findings CTA HEAD FINDINGS Anterior circulation: The right internal carotid artery is occluded at the terminus. Clot extends beyond the right MCA bifurcation. Anterior branches opacify. Poor collateralization is evident posteriorly. The left internal carotid artery is within normal limits through the terminus. Left A1 and M1 segments are normal. Anterior communicating artery is patent. Both ACA vessels fill from the left. Retrograde right A1 flow does not reach the ICA  terminus secondary to  the T occlusion. Left MCA bifurcation and branch vessels are within normal limits. Posterior circulation: The right vertebral artery is dominant vessel. PICA origins are visualized and within normal limits. Basilar artery is normal. The left posterior cerebral artery originates from the basilar tip. The right posterior cerebral artery is of fetal type is fed from just below the thrombus. PCA branch vessels are within normal limits bilaterally. Venous sinuses: The dural sinuses are patent. Straight sinus deep cerebral veins are intact. Cortical veins are within normal limits. No vascular malformation is present. Anatomic variants: Fetal type right posterior cerebral artery. Review of the MIP images confirms the above findings CT Brain Perfusion Findings: ASPECTS: 4/10 CBF (<30%) Volume: 168m Perfusion (Tmax>6.0s) volume: 206mMismatch Volume: 9698mnfarction Location:Right MCA territory IMPRESSION: 1. Confirmed large right MCA territory infarct with core volume of 107 mL. 2. Large mismatch volume of 9.6 with a mismatch ratio of 1.9. 3. T occlusion of right ICA terminus. 4. Thrombus extends through the right MCA bifurcation with poor collaterals. 5. Minimal atherosclerotic changes at the carotid bifurcations bilaterally without significant stenosis. 6. No significant intracranial disease on the left or in the posterior circulation. 7. Fetal type right posterior cerebral artery originates just below the terminal ICA thrombus. Electronically Signed   By: ChrSan MorelleD.   On: 10/31/2019 18:17   IR CT Head Ltd  Result Date: 11/03/2019 INDICATION: [age]ar old female with past medical history significant for hypertension, diabetes mellitus type 2 and chronic AFib on Coumadin, status post pacemaker. She presented to ED with sudden onset right gaze deviation and left-sided weakness on 10/31/2018. Her last seen well was at 1 p.m. on the same day. No intravenous tPA administered given use of Coumadin. NIHSS 14;  baseline modified Rankin scale 0. 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 Extensive discussion held with neuro hospitalist attending, patient's daughter and patient's husband. Family informed of the large infarct already established and the irreversible nature of the injury to most of the ischemic tissue. Expected outcomes discussed such as hemiplegia, possible need for neurosurgical intervention, dependency and even death. It was explained that revascularization could potentially save a small area of spared cortex in the high convexity, but that there was a significant chance that treatment could be futile or result in worsened outcome related to possible complication or increased chance of hemorrhagic transformation. Family understood and requested to proceed with endovascular intervention. Informed consent was signed by patient's husband. EXAM: Diagnostic cerebral angiogram Emergency mechanical thrombectomy Flat panel head CT COMPARISON:  CT/CT angiogram of the head and neck for 6 in 2021. MEDICATIONS: None. ANESTHESIA/SEDATION: General anesthesia performed. CONTRAST:  70 mL Omnipaque 240 FLUOROSCOPY TIME:  Fluoroscopy Time: 42 minutes 42 seconds (937 mGy). COMPLICATIONS: None immediate. TECHNIQUE: Informed written consent was obtained from the patient's husband after a thorough discussion of the procedural risks, benefits and alternatives. All questions were addressed. Maximal Sterile Barrier Technique was utilized including caps, mask, sterile gowns, sterile gloves, sterile drape, hand hygiene and skin antiseptic. A timeout was performed prior to the initiation of the procedure. Using a micropuncture kit and the modified Seldinger technique, access was gained to the right common femoral artery and an 8 French sheath was placed. Then, an 8 [place] guide catheter was navigated over a 6 [place] 2 catheter  and a 0.035 inch Terumo Glidewire into the aortic arch under fluoroscopic guidance.  The catheter was placed into the right common carotid artery and then advanced into the right internal carotid artery. Frontal and lateral angiograms of the head were obtained. FINDINGS: Right ICA occlusion just distal to the origin of the anterior choroidal artery. PROCEDURE: Under biplane roadmap, a large bore aspiration catheter was navigated over a phenom 21 microcatheter and a synchro support microguidewire into the cavernous segment of the right ICA. The microcatheter was then navigated over the wire into the right M1/MCA. Then, a 6 x 40 mm solitaire stent retriever was deployed spanning the supraclinoid right ICA and M1 segment. The device was allowed to intercalated with the clot for 4 minutes. The microcatheter was removed. The aspiration catheter was advanced to the level of occlusion and connected to a penumbra aspiration pump. The guiding catheter balloon was inflated. The thrombectomy device and aspiration catheter were removed under constant aspiration. Right ICA angiogram showed persistent left ICA terminus occlusion. Under biplane roadmap, a large bore aspiration catheter was navigated over a phenom 21 microcatheter and a synchro support microguidewire into the cavernous segment of the right ICA. The microcatheter was then navigated over the wire into the right M1/MCA. Then, a 5 x 37 mm embotrap stent retriever was deployed spanning the supraclinoid right ICA and M1 segment. The device was allowed to intercalated with the clot for 4 minutes. The microcatheter was removed. The aspiration catheter was advanced to the level of occlusion and connected to a penumbra aspiration pump. The guiding catheter balloon was inflated. The thrombectomy device and aspiration catheter were removed under constant aspiration. Right ICA angiogram showed recanalization of the ICA terminus and A1 segment with persistent occlusion of the M1  segment. Embolus to the right pericallosal artery (A3 segment) was noted. Under biplane roadmap, a large bore aspiration catheter was navigated over a phenom 21 microcatheter and a synchro support microguidewire into the cavernous segment of the right ICA. The microcatheter was then navigated over the wire into the right M1/MCA. Then, a 5 x 37 mm embotrap stent retriever was deployed spanning the supraclinoid right ICA and M1 segment. The device was allowed to intercalated with the clot for 4 minutes. The microcatheter was removed. The aspiration catheter was advanced to the level of occlusion and connected to a penumbra aspiration pump. The guiding catheter balloon was inflated. The thrombectomy device and aspiration catheter were removed under constant aspiration. Follow-up right ICA angiograms showed complete recanalization of the right MCA vascular tree with migration of the A3 segment embolus to the A4 segment. Under biplane roadmap, a large bore aspiration catheter was navigated over a 3 max aspiration catheter and a synchro support microguidewire into the cavernous segment of the right ICA. The 3 max was then navigated over the wire into the pericallosal artery A4 segment at the level of occlusionand connected to a penumbra aspiration pump. The guiding catheter balloon was inflated. After 5 minutes of continuous aspiration, the 3 max was retracted. Follow-up right ICA angiogram showed complete recanalization of the right ACA vascular tree with persistent recanalization of the MCA vascular tree (TICI 3). Flat panel CT of the head was obtained and post processed in a separate workstation with concurrent attending physician supervision. Selected images were sent to PACS. Mild swelling and contrast staining of the right MCA territory cortical ribbon, basal ganglia and amygdala was noted with partial effacement of the right lateral ventricle without midline shift. No subarachnoid hemorrhage seen. Right common  femoral artery angiograms with right anterior oblique and lateral views  showed access at the level of the common femoral artery which has normal caliber, adequate for for device utilization. The 8 French sheath was exchanged over the wire for a Perclose ProGlide which was utilized for access closure. Immediate hemostasis was achieved. IMPRESSION: 1. Successful mechanical thrombectomy/aspiration of a right ICA terminus "T" occlusion. A total of 3 stent retriever with combined aspiration passes and 1 direct contact aspiration performed for a complete (TICI3) recanalization. 2. No embolus to new territory. 3. No hemorrhagic complication on postprocedural flat panel CT. Although, early ischemic changes related to ongoing infarct with cortical and basal ganglia swelling and contrast staining are noted on postprocedural flat panel CT with partial effacement of the right lateral ventricle. PLAN: Transferred to ICU for close monitoring and swell watch. Electronically Signed   By: Pedro Earls M.D.   On: 11/03/2019 14:44   CT Code Stroke Cerebral Perfusion with contrast  Result Date: 10/31/2019 CLINICAL DATA:  Last seen normal 5 hours ago. New onset left-sided weakness. Left facial droop and gaze. EXAM: CT ANGIOGRAPHY HEAD AND NECK CT PERFUSION BRAIN TECHNIQUE: Multidetector CT imaging of the head and neck was performed using the standard protocol during bolus administration of intravenous contrast. Multiplanar CT image reconstructions and MIPs were obtained to evaluate the vascular anatomy. Carotid stenosis measurements (when applicable) are obtained utilizing NASCET criteria, using the distal internal carotid diameter as the denominator. Multiphase CT imaging of the brain was performed following IV bolus contrast injection. Subsequent parametric perfusion maps were calculated using RAPID software. CONTRAST:  173m OMNIPAQUE IOHEXOL 350 MG/ML SOLN COMPARISON:  CT head without contrast 10/31/2019.  FINDINGS: CTA NECK FINDINGS Aortic arch: A 4 vessel arch configuration is present. Left vertebral artery originates directly from the arch. No significant atherosclerotic calcification or stenosis is present. Right carotid system: The right common carotid artery is within normal limits. Atherosclerotic changes are present at the proximal right ICA without a significant stenosis. Cervical right ICA is otherwise. Left carotid system: The left common carotid artery is within normal limits. Minimal atherosclerotic changes are present without significant stenosis. Cervical left ICA is otherwise normal. Vertebral arteries: Right vertebral artery originates from the subclavian artery without significant stenosis. It is the dominant vessel. No significant stenosis is present in either vertebral artery in the neck. Skeleton: Mild degenerative changes are present at C5-6. Uncovertebral spurring is present the right C4-5. No focal lytic or blastic lesions are present. Other neck: The soft tissues the neck are otherwise unremarkable. Upper chest: The lung apices are clear. Thoracic inlet is within normal limits. Review of the MIP images confirms the above findings CTA HEAD FINDINGS Anterior circulation: The right internal carotid artery is occluded at the terminus. Clot extends beyond the right MCA bifurcation. Anterior branches opacify. Poor collateralization is evident posteriorly. The left internal carotid artery is within normal limits through the terminus. Left A1 and M1 segments are normal. Anterior communicating artery is patent. Both ACA vessels fill from the left. Retrograde right A1 flow does not reach the ICA terminus secondary to the T occlusion. Left MCA bifurcation and branch vessels are within normal limits. Posterior circulation: The right vertebral artery is dominant vessel. PICA origins are visualized and within normal limits. Basilar artery is normal. The left posterior cerebral artery originates from the  basilar tip. The right posterior cerebral artery is of fetal type is fed from just below the thrombus. PCA branch vessels are within normal limits bilaterally. Venous sinuses: The dural sinuses are patent. Straight sinus  deep cerebral veins are intact. Cortical veins are within normal limits. No vascular malformation is present. Anatomic variants: Fetal type right posterior cerebral artery. Review of the MIP images confirms the above findings CT Brain Perfusion Findings: ASPECTS: 4/10 CBF (<30%) Volume: 148m Perfusion (Tmax>6.0s) volume: 2031mMismatch Volume: 9669mnfarction Location:Right MCA territory IMPRESSION: 1. Confirmed large right MCA territory infarct with core volume of 107 mL. 2. Large mismatch volume of 9.6 with a mismatch ratio of 1.9. 3. T occlusion of right ICA terminus. 4. Thrombus extends through the right MCA bifurcation with poor collaterals. 5. Minimal atherosclerotic changes at the carotid bifurcations bilaterally without significant stenosis. 6. No significant intracranial disease on the left or in the posterior circulation. 7. Fetal type right posterior cerebral artery originates just below the terminal ICA thrombus. Electronically Signed   By: ChrSan MorelleD.   On: 10/31/2019 18:17   DG Chest Port 1 View  Result Date: 11/03/2019 CLINICAL DATA:  Acute respiratory failure with hypoxia. EXAM: PORTABLE CHEST 1 VIEW COMPARISON:  November 02, 2019. FINDINGS: Stable cardiomediastinal silhouette. No pneumothorax or pleural effusion is noted. Endotracheal and nasogastric tubes are unchanged in position. Left-sided PICC line is unchanged. Stable right-sided pacemaker. Both lungs are clear. The visualized skeletal structures are unremarkable. IMPRESSION: Stable support apparatus. No acute cardiopulmonary abnormality seen. Electronically Signed   By: JamMarijo ConceptionD.   On: 11/03/2019 08:10   DG CHEST PORT 1 VIEW  Result Date: 11/02/2019 CLINICAL DATA:  Intubated EXAM: PORTABLE CHEST  1 VIEW COMPARISON:  Chest radiograph from one day prior. FINDINGS: Stable configuration of 2 lead right subclavian pacemaker. Intact sternotomy wires. Left PICC terminates over the cavoatrial junction. Endotracheal tube tip is 2.1 cm above the carina. Enteric tube terminates in the proximal stomach. Stable cardiomediastinal silhouette with mild cardiomegaly. No pneumothorax. No pleural effusion. No overt pulmonary edema. No acute consolidative airspace disease. IMPRESSION: 1. Well-positioned support structures. No pneumothorax. 2. Stable mild cardiomegaly without overt pulmonary edema. No active pulmonary disease. Electronically Signed   By: JasIlona SorrelD.   On: 11/02/2019 11:51   DG CHEST PORT 1 VIEW  Result Date: 11/01/2019 CLINICAL DATA:  PICC repositioning EXAM: PORTABLE CHEST 1 VIEW COMPARISON:  Chest radiograph from earlier today. FINDINGS: Well-positioned left PICC with tip overlying the cavoatrial junction. Intact sternotomy wires. Stable configuration of 2 lead right subclavian pacemaker. Stable cardiomediastinal silhouette with borderline mild cardiomegaly. No pneumothorax. No pleural effusion. Lungs appear clear, with no acute consolidative airspace disease and no pulmonary edema. IMPRESSION: 1. Well-positioned left PICC with tip overlying the cavoatrial junction. 2. Borderline mild cardiomegaly. No pulmonary edema. No active pulmonary disease. Electronically Signed   By: JasIlona SorrelD.   On: 11/01/2019 18:47   DG CHEST PORT 1 VIEW  Result Date: 11/01/2019 CLINICAL DATA:  PICC placement EXAM: PORTABLE CHEST 1 VIEW COMPARISON:  Chest radiograph from one day prior. FINDINGS: Stable configuration of 2 lead right subclavian pacemaker. Intact sternotomy wires. Malpositioned left PICC with tip oriented superiorly in high right mediastinum. Stable cardiomediastinal silhouette with top-normal heart size. No pneumothorax. No pleural effusion. No overt pulmonary edema. No acute consolidative airspace  disease. IMPRESSION: 1. Malpositioned left PICC with tip oriented superiorly in the high right mediastinum, recommend repositioning. 2. No active cardiopulmonary disease. These results were called by telephone at the time of interpretation on 11/01/2019 at 5:59 pm to provider [facility]The [facility]who verbally acknowledged these results. Electronically Signed   By: JasJanina Mayo  On: 11/01/2019 18:04   DG Chest Port 1 View  Result Date: 10/31/2019 CLINICAL DATA:  Stroke. EXAM: PORTABLE CHEST 1 VIEW COMPARISON:  11/26/2008 FINDINGS: Right-sided pacemaker in place. Post median sternotomy. Mild cardiomegaly. No pulmonary edema, focal airspace disease, pleural effusion or pneumothorax. Bones appear under mineralized. IMPRESSION: Mild cardiomegaly. No acute chest findings. Electronically Signed   By: Keith Rake M.D.   On: 10/31/2019 21:54   ECHOCARDIOGRAM COMPLETE  Result Date: 11/02/2019    ECHOCARDIOGRAM REPORT   Patient Name:   Krystal Little Date of Exam: 11/02/2019 Medical Rec #:  [id number]        Height:       64.0 in Accession #:    [id number]       Weight:       140.2 lb Date of Birth:  1956/01/13         BSA:          1.682 m Patient Age:    64 years         BP:           111/73 mmHg Patient Gender: F                HR:           82 bpm. Exam Location:  Inpatient Procedure: 2D Echo, Cardiac Doppler and Color Doppler Indications:    Stroke 434.91/I163.9  History:        Patient has prior history of Echocardiogram examinations, most                 recent 05/27/2018. Pacemaker, Mitral Valve Disease; Risk                 Factors:Diabetes, Hypertension and Dyslipidemia. S/p mitral                 valve repair.  Sonographer:    Clayton Lefort RDCS (AE) Referring Phys: [id number] [place]  Sonographer Comments: Echo performed with patient supine and on artificial respirator. IMPRESSIONS  1. Left ventricular ejection fraction, by estimation, is 50 to 55%. The left ventricle has low normal function. There is  moderate left ventricular hypertrophy. Left ventricular diastolic parameters are indeterminate.  2. Right ventricular systolic function is moderately reduced. The right ventricular size is normal.  3. Left atrial size was moderately dilated.  4. The mitral valve has been repaired. There is a prosthetic annuloplasty ring present in the mitral position. No evidence of mitral valve regurgitation. The mean mitral valve gradient is 4.0 mmHg.  5. Tricuspid valve regurgitation is moderate.  6. The aortic valve was not well visualized. Aortic valve regurgitation is not visualized. No aortic stenosis is present. FINDINGS  Left Ventricle: Left ventricular ejection fraction, by estimation, is 50 to 55%. The left ventricle has low normal function. The left ventricle has no regional wall motion abnormalities. The left ventricular internal cavity size was small. There is moderate left ventricular hypertrophy. Left ventricular diastolic parameters are indeterminate. Right Ventricle: The right ventricular size is normal. Right vetricular wall thickness was not assessed. Right ventricular systolic function is moderately reduced. There is mildly elevated pulmonary artery systolic pressure. The tricuspid regurgitant velocity is 2.76 m/s, and with an assumed right atrial pressure of 10 mmHg, the estimated right ventricular systolic pressure is 95.0 mmHg. Left Atrium: Left atrial size was moderately dilated. Right Atrium: Right atrial size was normal in size. Pericardium: Trivial pericardial effusion is present. Mitral Valve: The mitral valve has been  repaired/replaced. No evidence of mitral valve regurgitation. There is a prosthetic annuloplasty ring present in the mitral position. MV peak gradient, 10.5 mmHg. The mean mitral valve gradient is 4.0 mmHg. Tricuspid Valve: The tricuspid valve is normal in structure. Tricuspid valve regurgitation is moderate. Aortic Valve: The aortic valve was not well visualized. Aortic valve  regurgitation is not visualized. No aortic stenosis is present. Aortic valve mean gradient measures 2.0 mmHg. Aortic valve peak gradient measures 4.5 mmHg. Aortic valve area, by VTI measures 1.89 cm. Pulmonic Valve: The pulmonic valve was not well visualized. Pulmonic valve regurgitation is not visualized. Aorta: The aortic root and ascending aorta are structurally normal, with no evidence of dilitation. Venous: IVC assessment for right atrial pressure unable to be performed due to mechanical ventilation. IAS/Shunts: No atrial level shunt detected by color flow Doppler.  LEFT VENTRICLE PLAX 2D LVIDd:         3.60 cm LVIDs:         2.70 cm LV PW:         2.00 cm LV IVS:        1.40 cm LVOT diam:     1.90 cm LV SV:         37 LV SV Index:   22 LVOT Area:     2.84 cm  RIGHT VENTRICLE            IVC RV Basal diam:  2.80 cm    IVC diam: 2.40 cm RV S prime:     5.51 cm/s TAPSE (M-mode): 1.0 cm LEFT ATRIUM              Index       RIGHT ATRIUM           Index LA diam:        4.60 cm  2.73 cm/m  RA Area:     15.00 cm LA Vol (A2C):   101.0 ml 60.04 ml/m RA Volume:   38.50 ml  22.89 ml/m LA Vol (A4C):   52.8 ml  31.39 ml/m LA Biplane Vol: 74.5 ml  44.29 ml/m  AORTIC VALVE AV Area (Vmax):    1.82 cm AV Area (Vmean):   1.77 cm AV Area (VTI):     1.89 cm AV Vmax:           106.00 cm/s AV Vmean:          74.400 cm/s AV VTI:            0.198 m AV Peak Grad:      4.5 mmHg AV Mean Grad:      2.0 mmHg LVOT Vmax:         68.14 cm/s LVOT Vmean:        46.340 cm/s LVOT VTI:          0.132 m LVOT/AV VTI ratio: 0.67  AORTA Ao Root diam: 3.00 cm Ao Asc diam:  3.00 cm MITRAL VALVE            TRICUSPID VALVE MV Peak grad: 10.5 mmHg TR Peak grad:   30.5 mmHg MV Mean grad: 4.0 mmHg  TR Vmax:        276.00 cm/s MV Vmax:      1.62 m/s MV Vmean:     91.8 cm/s SHUNTS                         Systemic VTI:  0.13 m  Systemic Diam: 1.90 cm Oswaldo Milian MD Electronically signed by Oswaldo Milian MD  Signature Date/Time: 11/02/2019/7:09:04 PM    Final    IR PERCUTANEOUS ART THROMBECTOMY/INFUSION INTRACRANIAL INC DIAG ANGIO  Result Date: 11/03/2019 INDICATION: 64 year old female with past medical history significant for hypertension, diabetes mellitus type 2 and chronic AFib on Coumadin, status post pacemaker. She presented to ED with sudden onset right gaze deviation and left-sided weakness on 10/31/2018. Her last seen well was at 1 p.m. on the same day. No intravenous tPA administered given use of Coumadin. NIHSS 14; baseline modified Rankin scale 0. 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 Extensive discussion held with neuro hospitalist attending, patient's daughter and patient's husband. Family informed of the large infarct already established and the irreversible nature of the injury to most of the ischemic tissue. Expected outcomes discussed such as hemiplegia, possible need for neurosurgical intervention, dependency and even death. It was explained that revascularization could potentially save a small area of spared cortex in the high convexity, but that there was a significant chance that treatment could be futile or result in worsened outcome related to possible complication or increased chance of hemorrhagic transformation. Family understood and requested to proceed with endovascular intervention. Informed consent was signed by patient's husband. EXAM: Diagnostic cerebral angiogram Emergency mechanical thrombectomy Flat panel head CT COMPARISON:  CT/CT angiogram of the head and neck for 6 in 2021. MEDICATIONS: None. ANESTHESIA/SEDATION: General anesthesia performed. CONTRAST:  70 mL Omnipaque 240 FLUOROSCOPY TIME:  Fluoroscopy Time: 42 minutes 42 seconds (937 mGy). COMPLICATIONS: None immediate. TECHNIQUE: Informed written consent was obtained from the patient's husband after a thorough discussion of the procedural  risks, benefits and alternatives. All questions were addressed. Maximal Sterile Barrier Technique was utilized including caps, mask, sterile gowns, sterile gloves, sterile drape, hand hygiene and skin antiseptic. A timeout was performed prior to the initiation of the procedure. Using a micropuncture kit and the modified Seldinger technique, access was gained to the right common femoral artery and an 8 French sheath was placed. Then, an [place] balloon guide catheter was navigated over a 6 [place] Berenstein 2 catheter and a 0.035 inch Terumo Glidewire into the aortic arch under fluoroscopic guidance. The catheter was placed into the right common carotid artery and then advanced into the right internal carotid artery. Frontal and lateral angiograms of the head were obtained. FINDINGS: Right ICA occlusion just distal to the origin of the anterior choroidal artery. PROCEDURE: Under biplane roadmap, a large bore aspiration catheter was navigated over a phenom 21 microcatheter and a synchro support microguidewire into the cavernous segment of the right ICA. The microcatheter was then navigated over the wire into the right M1/MCA. Then, a 6 x 40 mm solitaire stent retriever was deployed spanning the supraclinoid right ICA and M1 segment. The device was allowed to intercalated with the clot for 4 minutes. The microcatheter was removed. The aspiration catheter was advanced to the level of occlusion and connected to a penumbra aspiration pump. The guiding catheter balloon was inflated. The thrombectomy device and aspiration catheter were removed under constant aspiration. Right ICA angiogram showed persistent left ICA terminus occlusion. Under biplane roadmap, a large bore aspiration catheter was navigated over a phenom 21 microcatheter and a synchro support microguidewire into the cavernous segment of the right ICA. The microcatheter was then navigated over the wire into the right M1/MCA. Then, a 5 x 37  mm embotrap  stent retriever was deployed spanning the supraclinoid right ICA and M1 segment. The device was allowed to intercalated with the clot for 4 minutes. The microcatheter was removed. The aspiration catheter was advanced to the level of occlusion and connected to a penumbra aspiration pump. The guiding catheter balloon was inflated. The thrombectomy device and aspiration catheter were removed under constant aspiration. Right ICA angiogram showed recanalization of the ICA terminus and A1 segment with persistent occlusion of the M1 segment. Embolus to the right pericallosal artery (A3 segment) was noted. Under biplane roadmap, a large bore aspiration catheter was navigated over a phenom 21 microcatheter and a synchro support microguidewire into the cavernous segment of the right ICA. The microcatheter was then navigated over the wire into the right M1/MCA. Then, a 5 x 37 mm embotrap stent retriever was deployed spanning the supraclinoid right ICA and M1 segment. The device was allowed to intercalated with the clot for 4 minutes. The microcatheter was removed. The aspiration catheter was advanced to the level of occlusion and connected to a penumbra aspiration pump. The guiding catheter balloon was inflated. The thrombectomy device and aspiration catheter were removed under constant aspiration. Follow-up right ICA angiograms showed complete recanalization of the right MCA vascular tree with migration of the A3 segment embolus to the A4 segment. Under biplane roadmap, a large bore aspiration catheter was navigated over a 3 max aspiration catheter and a synchro support microguidewire into the cavernous segment of the right ICA. The 3 max was then navigated over the wire into the pericallosal artery A4 segment at the level of occlusionand connected to a penumbra aspiration pump. The guiding catheter balloon was inflated. After 5 minutes of continuous aspiration, the 3 max was retracted. Follow-up right ICA angiogram showed  complete recanalization of the right ACA vascular tree with persistent recanalization of the MCA vascular tree (TICI 3). Flat panel CT of the head was obtained and post processed in a separate workstation with concurrent attending physician supervision. Selected images were sent to PACS. Mild swelling and contrast staining of the right MCA territory cortical ribbon, basal ganglia and amygdala was noted with partial effacement of the right lateral ventricle without midline shift. No subarachnoid hemorrhage seen. Right common femoral artery angiograms with right anterior oblique and lateral views showed access at the level of the common femoral artery which has normal caliber, adequate for for device utilization. The 8 French sheath was exchanged over the wire for a Perclose ProGlide which was utilized for access closure. Immediate hemostasis was achieved. IMPRESSION: 1. Successful mechanical thrombectomy/aspiration of a right ICA terminus "T" occlusion. A total of 3 stent retriever with combined aspiration passes and 1 direct contact aspiration performed for a complete (TICI3) recanalization. 2. No embolus to new territory. 3. No hemorrhagic complication on postprocedural flat panel CT. Although, early ischemic changes related to ongoing infarct with cortical and basal ganglia swelling and contrast staining are noted on postprocedural flat panel CT with partial effacement of the right lateral ventricle. PLAN: Transferred to ICU for close monitoring and swell watch. Electronically Signed   By: Pedro Earls M.D.   On: 11/03/2019 14:44   CT HEAD CODE STROKE WO CONTRAST  Result Date: 10/31/2019 CLINICAL DATA:  Code stroke. Acute onset of left-sided facial droop and abnormal gaze. EXAM: CT HEAD WITHOUT CONTRAST TECHNIQUE: Contiguous axial images were obtained from the base of the skull through the vertex without intravenous contrast. COMPARISON:  None. FINDINGS: Brain: A large right MCA  infarct is  noted. There is hypoattenuation involving the right caudate head, internal capsule, and lentiform nucleus. Hypoattenuation is seen at the insular ribbon, right superior temporal gyrus, and super ganglionic posterior right frontal lobe. No acute hemorrhage is present. No mass effect scratched at there is some partial effacement of the sulci. No midline shift is present. Basal ganglia are normal on the left. The brainstem and cerebellum are within normal limits. The ventricles are of normal size. No significant extraaxial fluid collection is present. Vascular: A hyperdense right MCA and ICA terminus is noted. Hyperdensity extends beyond the right MCA bifurcation. The left MCA is unremarkable. Atherosclerotic calcifications are present within the cavernous internal carotid arteries bilaterally. Skull: 1 Calvarium is intact. No focal lytic or blastic lesions are present. No significant extracranial soft tissue lesion is present. Sinuses/Orbits: The paranasal sinuses and mastoid air cells are clear. The globes and orbits are within normal limits. ASPECTS [facility] Stroke Program Early CT Score) - Ganglionic level infarction (caudate, lentiform nuclei, internal capsule, insula, M1-M3 cortex): 2/7 - Supraganglionic infarction (M4-M6 cortex): 2/3 Total score (0-10 with 10 being normal): 4/10 IMPRESSION: 1. Large right MCA territory nonhemorrhagic infarct is described. 2. Hyperdense right MCA suggesting extensive thrombus from the right ICA terminus through the MCA bifurcation 3. Some mass effect with partial effacement the sulci on the right. No midline shift. 4. ASPECTS is 4/10 The above was relayed via text pager to Dr. Roland Rack on 10/31/2019 at 17:59 . Electronically Signed   By: San Morelle M.D.   On: 10/31/2019 18:03   [place] EKG SITE RITE  Result Date: 11/01/2019 If Site Rite image not attached, placement could not be confirmed due to current cardiac rhythm.   Labs:  CBC: Recent Labs     11/01/19 0945 11/01/19 0945 11/02/19 0243 11/02/19 0243 11/02/19 0848 11/02/19 1202 11/03/19 0408 11/04/19 0400  WBC 13.8*  --  14.0*  --   --   --  15.3* 13.6*  HGB 14.5   < > 12.5   < > 10.9* 11.2* 11.5* 11.1*  HCT 41.4   < > 36.7   < > 32.0* 33.0* 35.6* 34.1*  PLT 168  --  134*  --   --   --  123* 109*   < > = values in this interval not displayed.    COAGS: Recent Labs    08/18/19 1101 10/31/19 1744 11/02/19 0243 11/03/19 0408  INR 1.9* 1.2 1.4* 1.3*  APTT  --  27  --   --     BMP: Recent Labs    11/01/19 0945 11/01/19 1515 11/02/19 0343 11/02/19 0343 11/02/19 0848 11/02/19 1037 11/02/19 1202 11/02/19 1549 11/03/19 0408 11/03/19 0408 11/03/19 1130 11/03/19 1655 11/03/19 2212 11/04/19 0400  NA 144   < > 147*   < > 152*   < > 151*   < > 155*   < > 153* 153* 157* 156*  K 3.4*  --  3.2*   < > 2.8*  --  3.7  --  3.3*  --   --   --   --  3.2*  CL 110  --  115*  --   --   --   --   --  124*  --   --   --   --  125*  CO2 24  --  24  --   --   --   --   --  23  --   --   --   --  22  GLUCOSE 239*  --  173*  --   --   --   --   --  148*  --   --   --   --  249*  BUN 10  --  7*  --   --   --   --   --  14  --   --   --   --  23  CALCIUM 9.0  --  8.5*  --   --   --   --   --  8.6*  --   --   --   --  8.7*  CREATININE 0.72  --  0.58  --   --   --   --   --  0.70  --   --   --   --  0.85  GFRNONAA >60  --  >60  --   --   --   --   --  >60  --   --   --   --  >60  GFRAA >60  --  >60  --   --   --   --   --  >60  --   --   --   --  >60   < > = values in this interval not displayed.    LIVER FUNCTION TESTS: Recent Labs    08/18/19 1120 10/31/19 1744  BILITOT 0.6 1.1  AST 26 25  ALT 31 20  ALKPHOS 135* 92  PROT 7.1 7.4  ALBUMIN 4.4 4.1    Assessment and Plan:  History of acute CVA s/p cerebral arteriogram with emergent mechanical thrombectomy of right ICA terminus occlusion, right MCA occlusion, and right ACA occlusion achieving a TICI 3 revascularization  10/31/2019 by Dr. Karenann Cai. Patient's condition stable- intubated/sedated, does not respond to voice or painful stimuli, no spontaneous movements of extremities. Right groin incision stable, distal pulses (DPs) 2+ bilaterally. Further plans per neurology/neurosurgery- appreciate and agree with management. Please call NIR with questions/concerns.   Electronically Signed: Earley Abide, PA-C 11/04/2019, 10:10 AM   I spent a total of 25 Minutes at the the patient's bedside AND on the patient's hospital floor or unit, greater than 50% of which was counseling/coordinating care for CVA s/p revascularization.

## 2019-11-04 NOTE — Progress Notes (Signed)
SLP Cancellation Note  Patient Details Name: Krystal Little MRN: [id number] DOB: February 02, 1956   Cancelled treatment:       Reason Eval/Treat Not Completed: Medical issues which prohibited therapy (remains on vent). Will continue to follow.    Osie Bond., M.A. [facility] Pager [phone] Office [phone]  11/04/2019, 8:02 AM

## 2019-11-04 NOTE — Progress Notes (Addendum)
NAME:  Krystal Little, MRN:  [id number], DOB:  07/13/1956, LOS: 4 ADMISSION DATE:  10/31/2019, CONSULTATION DATE:  4/18 REFERRING MD:  Neurology service CHIEF COMPLAINT:  Vent dep post craniotomy    Brief History   20 yobf with hbp, dm, caf on coumadin with pacemaker found down/incontinent  p 2 h since last observed on pm 4/16> to [facility] ED as code stroke with R MCA > IR endovascular thrombectomy with revascularization with large area of edema post pocedure and progressive decrease LOC  > to OR am 4/18 for decompressive hemicraniectomy and return on vent so PCCM service asked to consult.  History of present illness   No independent hx obtainable  Past Medical History  HBP CAF on coumadin DM   Significant Hospital Events   4/16 IR thrombectomy  4/18 for decompressive hemicraniectomy  Consults:  [facility] PCCM   Procedures:  PIC LUE 4/17 > Oral et 4/18 > L RA art line 4/18 >  Significant Diagnostic Tests:  CT head 4/18 > Worsened edema within the right MCA territory with increased size. of right basal ganglia intraparenchymal hematoma. Increased amount of subarachnoid blood over the right hemisphere. 11 mm of leftward midline shift at the level of the foramen of Monro with early herniation of the right cingulate gyrus beneath the falx cerebri CT head 4/20 > continued interval evolution of large right MCA subacute infract with continued interval increase in edema and swelling.  Petechial hemorrhage throughout the infarct has increased.  Similar parenchymal hemorrhage centered within the right basal ganglia.  Micro Data:  RVP 4/16 neg  MRSA PCR  4/17 neg   Antimicrobials:     Interim history/subjective:  Cleviprex off overnight along with fentanyl.  Attempted PSV wean this AM but developed tachypnea and tachycardia so started back on cleviprex and fentanyl and switched back to full support for now.  Objective   Blood pressure (!) 194/70, pulse 98, temperature 99.6 F  (37.6 C), temperature source Axillary, resp. rate (!) 28, height 5\' 4"  (1.626 m), weight 63.6 kg, SpO2 99 %.    Vent Mode: PRVC FiO2 (%):  [30 %-40 %] 30 % Set Rate:  [15 bmp] 15 bmp Vt Set:  [430 mL] 430 mL PEEP:  [5 cmH20] 5 cmH20 Pressure Support:  [5 cmH20] 5 cmH20 Plateau Pressure:  [18 cmH20-23 cmH20] 18 cmH20   Intake/Output Summary (Last 24 hours) at 11/04/2019 0815 Last data filed at 11/04/2019 0600 Gross per 24 hour  Intake 1612.2 ml  Output 880 ml  Net 732.2 ml   Filed Weights   10/31/19 1907  Weight: 63.6 kg    Examination:  General: Adult female, resting in bed, in NAD. Neuro: Sedated, not responsive. HEENT: Right scalp incision with dried blood, dressing intact. Sclerae anicteric. ETT in place. Cardiovascular: IRIR, no M/R/G.  Lungs: Respirations even and unlabored.  CTA bilaterally, No W/R/R. Abdomen: BS x 4, soft, NT/ND.  Musculoskeletal: No gross deformities, no edema.  Skin: Intact, warm, no rashes.   Assessment & Plan:   Acute hypoxemic respiratory failure in setting of emergent hemicraniectomy for vasogenic cerebral edema s/p R MCA/ thrombectomy. - Full vent support with daily SBT as tolerated (failed this AM due to tachypnea and tachycardia). - Assess CXR due to transient desaturations this AM (FiO2 increased from 30% to 50%). - Continue fentanyl infusion for RASS goal 0 to -1.   Rapid afib in setting of CAF/ has pacemaker.  Remains rapid on tele with rates 100 - 110. -  Continue lopressor. - Add back home diltiazem. - Continue to hold home warfarin.  HTN. - Cleviprex wean to off as able for SBP goal 100-140 mmHg.  - Continue home lopressor (dose increased by neuro). - Add back home Dilt. Hopefully will help wean cleviprex.  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 - Per primary / stroke team. - Continue 3% saline per neuro.  Hypokalemia - being repleted. - Follow BMP.  Hyperglycemia. - SSI / lantus.  Best practice:  Diet: Tube  feeds. Pain/Anxiety/Delirium protocol (if indicated): Fentanyl drip. VAP protocol (if indicated): In place. DVT prophylaxis: SCD. GI prophylaxis: PPI. Glucose control: SSI / lantus. Mobility: BR. Code Status: full code. Family Communication: Daughter updated at bedside. Disposition:  ICU.  CC time: 35 min.   Montey Hora, [place] [facility] 11/04/2019, 8:41 AM

## 2019-11-05 DIAGNOSIS — I63411 Cerebral infarction due to embolism of right middle cerebral artery: Secondary | ICD-10-CM | POA: Diagnosis not present

## 2019-11-05 DIAGNOSIS — I959 Hypotension, unspecified: Secondary | ICD-10-CM

## 2019-11-05 DIAGNOSIS — Z978 Presence of other specified devices: Secondary | ICD-10-CM

## 2019-11-05 DIAGNOSIS — E876 Hypokalemia: Secondary | ICD-10-CM | POA: Diagnosis not present

## 2019-11-05 DIAGNOSIS — I482 Chronic atrial fibrillation, unspecified: Secondary | ICD-10-CM | POA: Diagnosis not present

## 2019-11-05 DIAGNOSIS — J9601 Acute respiratory failure with hypoxia: Secondary | ICD-10-CM | POA: Diagnosis not present

## 2019-11-05 LAB — CBC
HCT: 31 % — ABNORMAL LOW (ref 36.0–46.0)
Hemoglobin: 10.1 g/dL — ABNORMAL LOW (ref 12.0–15.0)
MCH: 31.6 pg (ref 26.0–34.0)
MCHC: 32.6 g/dL (ref 30.0–36.0)
MCV: 96.9 fL (ref 80.0–100.0)
Platelets: 87 10*3/uL — ABNORMAL LOW (ref 150–400)
RBC: 3.2 MIL/uL — ABNORMAL LOW (ref 3.87–5.11)
RDW: 14.7 % (ref 11.5–15.5)
WBC: 10.7 10*3/uL — ABNORMAL HIGH (ref 4.0–10.5)
nRBC: 0 % (ref 0.0–0.2)

## 2019-11-05 LAB — GLUCOSE, CAPILLARY
Glucose-Capillary: 285 mg/dL — ABNORMAL HIGH (ref 70–99)
Glucose-Capillary: 285 mg/dL — ABNORMAL HIGH (ref 70–99)
Glucose-Capillary: 231 mg/dL — ABNORMAL HIGH (ref 70–99)
Glucose-Capillary: 262 mg/dL — ABNORMAL HIGH (ref 70–99)
Glucose-Capillary: 292 mg/dL — ABNORMAL HIGH (ref 70–99)
Glucose-Capillary: 256 mg/dL — ABNORMAL HIGH (ref 70–99)

## 2019-11-05 LAB — BASIC METABOLIC PANEL
Anion gap: 4 — ABNORMAL LOW (ref 5–15)
BUN: 28 mg/dL — ABNORMAL HIGH (ref 8–23)
CO2: 24 mmol/L (ref 22–32)
Calcium: 8.4 mg/dL — ABNORMAL LOW (ref 8.9–10.3)
Chloride: 127 mmol/L — ABNORMAL HIGH (ref 98–111)
Creatinine, Ser: 0.95 mg/dL (ref 0.44–1.00)
GFR calc Af Amer: >60 mL/min (ref >60)
GFR calc non Af Amer: >60 mL/min (ref >60)
Glucose, Bld: 320 mg/dL — ABNORMAL HIGH (ref 70–99)
Potassium: 3.9 mmol/L (ref 3.5–5.1)
Sodium: 155 mmol/L — ABNORMAL HIGH (ref 135–145)

## 2019-11-05 LAB — SODIUM
Sodium: 154 mmol/L — ABNORMAL HIGH (ref 135–145)
Sodium: 155 mmol/L — ABNORMAL HIGH (ref 135–145)

## 2019-11-05 LAB — MAGNESIUM: Magnesium: 2.2 mg/dL (ref 1.7–2.4)

## 2019-11-05 MED ORDER — DILTIAZEM 12 MG/ML ORAL SUSPENSION
60.0000 mg | Freq: Four times a day (QID) | ORAL | Status: DC
  Administered 2019-11-05 – 2019-11-20 (×61): 60 mg
  Filled 2019-11-05 (×69): qty 6

## 2019-11-05 MED ORDER — INSULIN GLARGINE 100 UNIT/ML ~~LOC~~ SOLN
10.0000 [IU] | Freq: Once | SUBCUTANEOUS | Status: AC
  Administered 2019-11-05: 10 [IU] via SUBCUTANEOUS
  Filled 2019-11-05: qty 0.1

## 2019-11-05 MED ORDER — INSULIN ASPART 100 UNIT/ML ~~LOC~~ SOLN
7.0000 [IU] | SUBCUTANEOUS | Status: DC
  Administered 2019-11-05 – 2019-11-10 (×28): 7 [IU] via SUBCUTANEOUS

## 2019-11-05 MED ORDER — AMIODARONE HCL IN DEXTROSE 360-4.14 MG/200ML-% IV SOLN
60.0000 mg/h | INTRAVENOUS | Status: DC
  Administered 2019-11-05 – 2019-11-06 (×5): 60 mg/h via INTRAVENOUS
  Filled 2019-11-05 (×4): qty 200

## 2019-11-05 MED ORDER — AMIODARONE HCL IN DEXTROSE 360-4.14 MG/200ML-% IV SOLN
60.0000 mg/h | INTRAVENOUS | Status: DC
  Administered 2019-11-05 (×2): 60 mg/h via INTRAVENOUS
  Filled 2019-11-05 (×2): qty 200

## 2019-11-05 MED ORDER — AMIODARONE LOAD VIA INFUSION
150.0000 mg | Freq: Once | INTRAVENOUS | Status: AC
  Administered 2019-11-05: 150 mg via INTRAVENOUS
  Filled 2019-11-05: qty 83.34

## 2019-11-05 MED ORDER — INSULIN GLARGINE 100 UNIT/ML ~~LOC~~ SOLN
20.0000 [IU] | Freq: Every day | SUBCUTANEOUS | Status: DC
  Administered 2019-11-06: 20 [IU] via SUBCUTANEOUS
  Filled 2019-11-05: qty 0.2

## 2019-11-05 NOTE — Progress Notes (Signed)
[facility] Progress Note Patient Name: Krystal Little DOB: 1955-08-23 MRN: [id number]   Date of Service  11/05/2019  HPI/Events of Note  AGIB with RVR - Ventricular rate - 120's to 140's. Last K+ = 4.5  BP = 107/48 with MAP = 64.   eICU Interventions  Will order: 1. Amiodarone Load and IV infusion. 2. Nursing requested to send AM labs now.      Intervention Category Major Interventions: Arrhythmia - evaluation and management  [name] 11/05/2019, 3:22 AM

## 2019-11-05 NOTE — Progress Notes (Signed)
NAME:  Krystal Little, MRN:  [id number], DOB:  April 17, 1956, LOS: 5 ADMISSION DATE:  10/31/2019, CONSULTATION DATE:  4/18 REFERRING MD:  Neurology service CHIEF COMPLAINT:  Vent dep post craniotomy    Brief History   56 yobf with hbp, dm, caf on coumadin with pacemaker found down/incontinent  p 2 h since last observed on pm 4/16> to [facility] ED as code stroke with R MCA > IR endovascular thrombectomy with revascularization with large area of edema post pocedure and progressive decrease LOC  > to OR am 4/18 for decompressive hemicraniectomy and return on vent so PCCM service asked to consult.  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   has a past surgical history that includes Mitral valve repair (07/1999); transthoracic echocardiogram (05/2018); Cardiac catheterization (08/03/1999); Pacemaker insertion (07/1999); Pacemaker generator change (11/26/2008); IR PERCUTANEOUS ART THROMBECTOMY/INFUSION INTRACRANIAL INC DIAG ANGIO (10/31/2019); Radiology with anesthesia (N/A, 10/31/2019); IR PERCUTANEOUS ART THROMBECTOMY/INFUSION INTRACRANIAL INC DIAG ANGIO (10/31/2019); [place] (10/31/2019); and Craniectomy for depressed skull fracture (Right, 11/02/2019).   Significant Hospital Events   4/16 IR thrombectomy  4/18 for decompressive hemicraniectomy 4/20 - Cleviprex off overnight along with fentanyl.  Attempted PSV wean this AM but developed tachypnea and tachycardia so started back on cleviprex and fentanyl and switched back to full support for now.  Consults:  [facility] PCCM   Procedures:  PIC LUE 4/17 > Oral et 4/18 > L RA art line 4/18 >  Significant Diagnostic Tests:   CT head 4/18 > Worsened edema within the right MCA territory with increased size. of right basal ganglia intraparenchymal hematoma. Increased amount of subarachnoid blood over the right hemisphere. 11 mm of leftward midline shift at the level of the foramen of Monro with early herniation of the right cingulate gyrus beneath the falx cerebri CT head 4/20 > continued interval evolution of large right MCA subacute infract with continued interval increase in edema and swelling.  Petechial hemorrhage throughout the infarct has increased.  Similar parenchymal hemorrhage centered within the right basal ganglia.  Micro Data:  RVP 4/16 neg  MRSA PCR  4/17 neg   Antimicrobials:     Interim history/subjective:   4.21  - on vent. On fent gtt. Off cleviprex x few days per rN. Overngiht A fib RVR and started on amio gtt. HR still 127 with  A fib RVR but also hypotensive/soft spb 80s-120s. On scheduled cardizem po and prn lopressor  Na 155 - but off 3% saline. MAde only 400cc urine overnight. Husband Krystal Little at beside - reports hx of rheumatic heart disease  Per RN - moves right side purposefully. Per husband moved LLE spontaneously x 1  On low dose fent  Gtt on vent. Doing SBT  Objective   Blood pressure 92/65, pulse (!) 111, temperature 98.6 F (37 C), temperature source Axillary, resp. rate 17, height 5\' 4"  (1.626 m), weight 63.6 kg, SpO2 100 %.    Vent Mode: PSV;CPAP FiO2 (%):  [40 %-50 %] 40 % Set Rate:  [15 bmp] 15 bmp Vt Set:  [430 mL] 430 mL PEEP:  [5 cmH20] 5 cmH20 Pressure Support:  [10 cmH20] 10 cmH20 Plateau Pressure:  [9 cmH20-18 cmH20] 13 cmH20  Intake/Output Summary (Last 24 hours) at 11/05/2019 1107 Last data filed at 11/05/2019 0800 Gross per 24 hour  Intake 2962.78 ml  Output 950 ml  Net 2012.78 ml   Filed Weights   10/31/19 1907  Weight: 63.6 kg    Examination:  General: Adult female, resting in bed, in NAD. Neuro: Sedated, not responsive. HEENT: Right  scalp incision with dried blood, dressing intact. Sclerae anicteric. ETT in place. Cardiovascular: IRIR, no M/R/G.  Lungs: Respirations even and unlabored.  CTA bilaterally, No W/R/R. Abdomen: BS x 4, soft, NT/ND.  Musculoskeletal: No gross deformities, no edema.  Skin: Intact, warm, no rashes.   Overall no change in exam compared to yesterday  Assessment & Plan:   Acute hypoxemic respiratory failure in setting of emergent hemicraniectomy for vasogenic cerebral edema s/p R MCA/ thrombectomy.  11/05/2019 - > does not meet criteria for SBT/Extubation in setting of Acute Respiratory Failure due to acute encephalopathy due to debilitating stroke  Plan -PRVC with SBT as tolertaed - Continue fentanyl infusion for RASS goal 0 to -1.   Rapid afib in setting of CAF/ has pacemaker.  Remains rapid on tele with rates 100 - 140 despite amio gtt and cardizem po  Plan  - cards consult - regarding control and restart anticoagulation - called    HTN.  11/05/2019 - off cleviprex. 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 - Per primary / stroke team. - Na target per neuro  Hyperglycemia. - SSI / lantus.  Best practice:  Diet: Tube feeds. Pain/Anxiety/Delirium protocol (if indicated): Fentanyl drip. VAP protocol (if indicated): In place. DVT prophylaxis: SCD. GI prophylaxis: PPI. Glucose control: SSI / lantus. Mobility: BR. Code Status: full code. Family Communication: Husband at bdside  Disposition:  ICU.     ATTESTATION & SIGNATURE   The patient Krystal Little is critically ill with multiple organ systems failure and requires high complexity decision making for assessment and support, frequent evaluation and titration of therapies, application of advanced monitoring technologies and extensive interpretation of multiple databases.   Critical Care Time devoted to patient care services  described in this note is  31  Minutes. This time reflects time of care of this signee Dr Brand Males. This critical care time does not reflect procedure time, or teaching time or supervisory time of PA/NP/Med student/Med Resident etc but could involve care discussion time     Dr. Brand Males, M.D., [facility].C.P Pulmonary and Critical Care Medicine Staff Physician [place] [facility] Pager: [phone], If no answer or between  15:00h - 7:00h: call [phone]  11/05/2019 11:09 AM     LABS    PULMONARY Recent Labs  Lab 10/31/19 1805 11/02/19 0848 11/02/19 1202  PHART  --  7.442 7.375  PCO2ART  --  34.1 39.1  PO2ART  --  339.0* 286.0*  HCO3  --  23.4 22.9  TCO2 29 24 24   O2SAT  --  100.0 100.0    CBC Recent Labs  Lab 11/03/19 0408 11/04/19 0400 11/05/19 0324  HGB 11.5* 11.1* 10.1*  HCT 35.6* 34.1* 31.0*  WBC 15.3* 13.6* 10.7*  PLT 123* 109* 87*    COAGULATION Recent Labs  Lab 10/31/19 1744 11/02/19 0243 11/03/19 0408  INR 1.2 1.4* 1.3*    CARDIAC  No results for input(s): TROPONINI in the last 168  hours. No results for input(s): PROBNP in the last 168 hours.   CHEMISTRY Recent Labs  Lab 11/01/19 0945 11/02/19 0243 11/02/19 0343 11/02/19 0848 11/03/19 0408 11/03/19 1130 11/04/19 0400 11/04/19 0400 11/04/19 0938 11/04/19 1655 11/04/19 2232 11/05/19 0324  NA   < > 174* 147*   < > 155*   < > 156*  --  153* 155* 154* 155*  K   < >  --  3.2*   < > 3.3*  --  3.2*   < > 4.5  --   --  3.9  CL   < >  --  115*  --  124*  --  125*  --  126*  --   --  127*  CO2   < >  --  24  --  23  --  22  --  21*  --   --  24  GLUCOSE   < >  --  173*  --  148*  --  249*  --  313*  --   --  320*  BUN   < >  --  7*  --  14  --  23  --  28*  --   --  28*  CREATININE   < >  --  0.58  --  0.70  --  0.85  --  0.83  --   --  0.95  CALCIUM   < >  --  8.5*  --  8.6*  --  8.7*  --  8.3*  --   --  8.4*  MG  --  1.9  --   --   --    --   --   --   --   --   --  2.2   < > = values in this interval not displayed.   Estimated Creatinine Clearance: 51 mL/min (by C-G formula based on SCr of 0.95 mg/dL).   LIVER Recent Labs  Lab 10/31/19 1744 11/02/19 0243 11/03/19 0408  AST 25  --   --   ALT 20  --   --   ALKPHOS 92  --   --   BILITOT 1.1  --   --   PROT 7.4  --   --   ALBUMIN 4.1  --   --   INR 1.2 1.4* 1.3*     INFECTIOUS No results for input(s): LATICACIDVEN, PROCALCITON in the last 168 hours.   ENDOCRINE CBG (last 3)  Recent Labs    11/04/19 2326 11/05/19 0316 11/05/19 0738  GLUCAP 263* 285* 285*         IMAGING x48h  - image(s) personally visualized  -   highlighted in bold CT HEAD WO CONTRAST  Addendum Date: 11/04/2019   ADDENDUM REPORT: 11/04/2019 08:31 ADDENDUM: These results were called by telephone at the time of interpretation on 11/04/2019 at 8:31 am to provider [facility] , who verbally acknowledged these results. Electronically Signed   By: Kellie Simmering DO   On: 11/04/2019 08:31   Result Date: 11/04/2019 CLINICAL DATA:  Stroke, follow-up. EXAM: CT HEAD WITHOUT CONTRAST TECHNIQUE: Contiguous axial images were obtained from the base of the skull through the vertex without intravenous contrast. COMPARISON:  Head CT 11/02/2019. FINDINGS: Brain: Interval decompressive right hemicraniectomy. Continued interval evolution of a large subacute right MCA vascular territory infarct. Continued interval increase in edema and swelling throughout much of the right MCA vascular territory. There is increased petechial hemorrhage within the  infarct territory. Additionally, small volume subarachnoid hemorrhage overlying the right cerebral hemisphere has slightly increased, most notably within the right sylvian fissure. Parenchymal hemorrhage within the right basal ganglia is similar to minimally increased as compared to 11/02/2019. The largest parenchymal hematoma centered within the right lentiform nucleus now  measures 4.6 x 2.2 cm (previously 4.5 x 2.0 cm). Similar degree of mass effect with partial effacement of the right greater than left lateral ventricles and third ventricle. Persistent 11 mm leftward midline shift. As before, there is early leftward subfalcine herniation. Persistent partial effacement of the right basal cisterns. No new demarcated cortical infarct is identified. No definite ventricular entrapment on the current exam. Small volume pneumocephalus along the right temporal lobe. Vascular: No hyperdense vessel. Skull: Right hemicraniectomy. Sinuses/Orbits: Visualized orbits demonstrate no acute abnormality. Mild scattered paranasal sinus mucosal thickening. No significant mastoid effusion. IMPRESSION: 1. Postoperative sequela from interval decompressive right hemicraniectomy. 2. Continued interval evolution of a large right MCA vascular territory subacute infarct with continued interval increase in edema and swelling. Petechial hemorrhage throughout the infarct territory has increased. Small volume subarachnoid hemorrhage overlying the right cerebral hemisphere has also slightly increased. Parenchymal hemorrhage centered within the right basal ganglia is similar to slightly increased as compared to 11/02/2019. 3. Similar degree of mass effect with partial effacement of the ventricular system and 11 mm leftward midline shift. As before, there is early leftward subfalcine herniation and persistent partial effacement of the right basal cisterns. Electronically Signed: By: Kellie Simmering DO On: 11/04/2019 07:41   DG Chest Port 1 View  Result Date: 11/04/2019 CLINICAL DATA:  Respiratory failure with hypoxia. EXAM: PORTABLE CHEST 1 VIEW COMPARISON:  11/03/2019 FINDINGS: ET tube tip is above the carina. There is a left arm PICC line with tip at the cavoatrial junction. Right chest wall pacer device is noted with leads in the right atrial appendage and right ventricle. Cardiac enlargement. New left lung base  retrocardiac opacity is identified which obscures the left hemidiaphragm concerning for left lower lobe atelectasis and or consolidation. IMPRESSION: 1. Complete opacification of the retrocardiac left lung base worrisome for left lower lobe atelectasis. 2. The left arm PICC line tip is at the cavoatrial junction. Electronically Signed   By: Kerby Moors M.D.   On: 11/04/2019 10:13

## 2019-11-05 NOTE — Consult Note (Addendum)
ELECTROPHYSIOLOGY CONSULT NOTE    Patient ID: Krystal Little MRN: [id number], DOB/AGE: [birth year] 63 y.o.  Admit date: 10/31/2019 Date of Consult: 11/05/2019  Primary Physician: Stark Klein, MD Primary Cardiologist: Glenetta Hew, MD  Electrophysiologist: Dr. Curt Bears   Referring Provider: Dr. Chase Little Patient Profile: Krystal Little is a 64 y.o. female with a history of permanent AF, SSS s/p MDT dual chamber PPM, HTN, and DM2 who is being seen today for the evaluation of AF with RVR at the request of Dr. Chase Little.  HPI:  Krystal Little is a 64 y.o. female known to EP team with history as above.   Pt found down at home 4/16 with urinary incontinence and brought to [facility] via EMS. Code stroke called with Large R MCA. No TPA on coumadin, though INR low at 1.2 on admit. Pt taken to IR for endovascular thrombectomy with revascularization with large area of edema post procedure and progressive decrease LOC. CT showed worsening edema and taken to OR 4/18 for decompressive hemicraniectomy and placed on vent.  Pt in chronic AF with worsening rates overnight up into 150s requiring IV amiodarone. Metoprolol held due to soft pressures. Diltiazem given as well per tube.   Her rates have varied from 100-130s, but have overall slowed on IV amiodarone. At the time of my exam pt with rates in upper 100-110s.  Pt remains intubated. Arijana Narayan follow commands with persistent urging per RN. Resting comfortably currently.   History obtained through chart with pt intubated/encepholopathic.    Past Medical History:  Diagnosis Date  . Chronic atrial fibrillation ([place]) 1990s   s/p DCCV then attempted ablation of complex A Flutter (@ [facility] - Dr. Deno Etienne), failed antiarrhythmics --> INR folllwed @ [facility] ->> now status post pacemaker placement with underlying A. fib.  . Diabetes mellitus type 2, uncontrolled, without complications    On Oral Medications ([facility])  . Essential hypertension   .  History of cardiac catheterization 2001   R&LHC - normal Coronaries, no evidence of Restictive Cardiomyopathy or Constrictive Pericarditis (also @ [facility])  . Hx of sick sinus syndrome 07/1999   Wtih symptomatic bradycardia - syncope (Tachy-Brady)  . 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 S/P placement of cardiac pacemaker 07/1999     Surgical History:  Past Surgical History:  Procedure Laterality Date  . CARDIAC CATHETERIZATION  08/03/1999   [place] - normal Coronaries; no sign of constriction or restrictive cardiomypathy  . CRANIECTOMY FOR DEPRESSED SKULL FRACTURE Right 11/02/2019   Procedure: DECOMPRESSIVE HEMI -CRANIECTOMY, IMPLANTATION OF SKULL FLAP TO RIGHT ABDOMEN;  Surgeon: Consuella Lose, MD;  Location: [place];  Service: Neurosurgery;  Laterality: Right;  . IR CT HEAD LTD  10/31/2019  . IR PERCUTANEOUS ART THROMBECTOMY/INFUSION INTRACRANIAL INC DIAG ANGIO  10/31/2019      . IR PERCUTANEOUS ART THROMBECTOMY/INFUSION INTRACRANIAL INC DIAG ANGIO  10/31/2019  . MITRAL VALVE REPAIR  07/1999   Both Ant& Post leaflet repair - quadrangular resection&caudal transposition, #32 Sequin Annuloplasty ring  . PACEMAKER GENERATOR CHANGE  11/26/2008   Medtronic Adapta L(? if it has been checked since 02/2013)  . PACEMAKER INSERTION  07/1999   for SSS  in setting of Chronic Afib; Medtronic Kappa [id number], [id number] H.  Marland Kitchen RADIOLOGY WITH ANESTHESIA N/A 10/31/2019   Procedure: IR WITH ANESTHESIA;  Surgeon: Radiologist, Medication, MD;  Location: [place];  Service: Radiology;  Laterality: N/A;  . TRANSTHORACIC ECHOCARDIOGRAM  05/2018   Normal LV size and thickness.  EF estimated 50%.  Inferobasal  HK and flattened septum c/w with elevated PA pressures. Mild functional mitral stenosis at rest (postoperative).  Moderate left atrial dilation and mild right atrial dilation.  Mean PA pressures estimated 52 mmHg.     Medications Prior to Admission  Medication Sig Dispense Refill Last Dose  . acetaminophen (TYLENOL) 500 MG tablet Take 500-1,000 mg by mouth every 6 (six) hours as needed for headache (pain).   unknown  . atenolol (TENORMIN) 50 MG tablet TAKE 1 TABLET(50 MG) BY MOUTH DAILY (Patient taking differently: Take 50 mg by mouth daily. ) 90 tablet 0 unknown  . CHROMIUM PO Take 1 tablet by mouth daily.   unknown  . diltiazem (CARDIZEM CD) 180 MG 24 hr capsule TAKE 1 CAPSULE(180 MG) BY MOUTH DAILY (Patient taking differently: Take 180 mg by mouth daily. ) 90 capsule 2 unknown  . ezetimibe (ZETIA) 10 MG tablet Take 10 mg by mouth daily.   unknown  . Gymnema Sylvestris Leaf POWD Take 1 capsule by mouth daily. "sugar destroyer"   unknown  . lisinopril (ZESTRIL) 5 MG tablet TAKE 1 TABLET(5 MG) BY MOUTH DAILY (Patient taking differently: Take 5 mg by mouth daily. ) 90 tablet 0 unknown  . metFORMIN (GLUCOPHAGE) 1000 MG tablet Take 1,000 mg by mouth 2 (two) times daily.   unknown  . Multiple Vitamin (MULTIVITAMIN WITH MINERALS) TABS tablet Take 1 tablet by mouth daily.   unknown  . warfarin (COUMADIN) 5 MG tablet TAKE 1 TABLET BY MOUTH DAILY EXCEPT TAKE 1 AND 1/2 TABLET ON MONDAY AND FRIDAY (Patient taking differently: Take 5 mg by mouth See admin instructions. Take 1 1/2 tablets (7.5 mg) by mouth on Monday and Friday, take 1 tablet (5 mg) on all other days of the week) 45 tablet 3 unknown    Inpatient Medications:  . chlorhexidine gluconate (MEDLINE KIT)  15 mL Mouth Rinse BID  . Chlorhexidine Gluconate Cloth  6 each Topical Q0600  . diltiazem  60 mg Per Tube Q8H  . insulin aspart  0-15 Units Subcutaneous Q4H  . insulin aspart  7 Units Subcutaneous Q4H  . [START ON 11/06/2019] insulin glargine   20 Units Subcutaneous Daily  . mouth rinse  15 mL Mouth Rinse 10 times per day  . pantoprazole sodium  40 mg Per Tube Daily  . polyethylene glycol  17 g Per Tube BID  . senna-docusate  1 tablet Per Tube BID  . sodium chloride flush  10-40 mL Intracatheter Q12H    Allergies: No Known Allergies  Social History   Socioeconomic History  . Marital status: Married    Spouse name: Riki Rusk   . Number of children: 2  . Years of education: 16 years.  . Highest education level: Not on file  Occupational History  . Occupation: product development    Employer: [employer]    Comment: prev worked as Education officer, environmental   Tobacco Use  . Smoking status: Never Smoker  . Smokeless tobacco: Never Used  Substance and Sexual Activity  . Alcohol use: Yes    Alcohol/week: 0.0 standard drinks    Comment: occasional, once every month or two- glass of wine   .  Drug use: No  . Sexual activity: Not Currently    Partners: Male    Comment: 1st intercourse 45 yo-5 partners  Other Topics Concern  . Not on file  Social History Narrative   Lives with husband, great-niece (53)  and great-nephew (40).    Daughter 18  yo 4th year college basketball player in [place].   1 son died in an automobile accident @ age 70 (2010).    She raised several nieces & nephews - now lives with her 14 y/o daughter (who recently moved back in)   Routinely walks 2-3 days/week.   Started new job through the [employer].   Social Determinants of Health   Financial Resource Strain:   . Difficulty of Paying Living Expenses:   Food Insecurity:   . Worried About Charity fundraiser in the Last Year:   . Arboriculturist in the Last Year:   Transportation Needs:   . Film/video editor (Medical):   Marland Kitchen Lack of Transportation (Non-Medical):   Physical Activity:   . Days of Exercise per Week:   . Minutes of Exercise per Session:   Stress:   . Feeling of Stress :   Social Connections:     . Frequency of Communication with Friends and Family:   . Frequency of Social Gatherings with Friends and Family:   . Attends Religious Services:   . Active Member of Clubs or Organizations:   . Attends Archivist Meetings:   Marland Kitchen Marital Status:   Intimate Partner Violence:   . Fear of Current or Ex-Partner:   . Emotionally Abused:   Marland Kitchen Physically Abused:   . Sexually Abused:      Family History  Problem Relation Age of Onset  . Hypertension Mother   . Diabetes Sister   . Cancer Brother        Rectal  . Lung cancer Brother      Review of Systems: All other systems reviewed and are otherwise negative except as noted above.  Physical Exam: Vitals:   11/05/19 0800 11/05/19 0845 11/05/19 0900 11/05/19 1137  BP: (!) 126/48 114/77 92/65   Pulse: (!) 118 (!) 141 (!) 111 (!) 137  Resp: '18 17 17 ' (!) 28  Temp: 98.6 F (37 C)     TempSrc: Axillary     SpO2: 99% 99% 100% 100%  Weight:      Height:        GEN- The patient is acutely ill appearing and intubated.  HEENT: R temporal scar. ETT+ Lungs- Diminished throughout with + mechanical breathing sounds.  Heart- Rapid, and irregularly irregular.  GI- soft, non-distended bowel sounds present Extremities- no clubbing, cyanosis, or edema; DP/PT/radial pulses 2+ bilaterally MS- no significant deformity or atrophy Skin- warm and dry, no rash or lesion Neuro- Intubated and encephalopathic. Intermittently follows commands per staff.   Labs:   Lab Results  Component Value Date   WBC 10.7 (H) 11/05/2019   HGB 10.1 (L) 11/05/2019   HCT 31.0 (L) 11/05/2019   MCV 96.9 11/05/2019   PLT 87 (L) 11/05/2019    Recent Labs  Lab 10/31/19 1744 10/31/19 1805 11/05/19 0324  NA 136   < > 155*  K 4.6   < > 3.9  CL 102   < > 127*  CO2 24   < > 24  BUN 11   < > 28*  CREATININE 0.69   < > 0.95  CALCIUM 9.0   < >  8.4*  PROT 7.4  --   --   BILITOT 1.1  --   --   ALKPHOS 92  --   --   ALT 20  --   --   AST 25  --   --    GLUCOSE 214*   < > 320*   < > = values in this interval not displayed.      Radiology/Studies: CT Code Stroke CTA Head W/WO contrast  Result Date: 10/31/2019 CLINICAL DATA:  Last seen normal 5 hours ago. New onset left-sided weakness. Left facial droop and gaze. EXAM: CT ANGIOGRAPHY HEAD AND NECK CT PERFUSION BRAIN TECHNIQUE: Multidetector CT imaging of the head and neck was performed using the standard protocol during bolus administration of intravenous contrast. Multiplanar CT image reconstructions and MIPs were obtained to evaluate the vascular anatomy. Carotid stenosis measurements (when applicable) are obtained utilizing NASCET criteria, using the distal internal carotid diameter as the denominator. Multiphase CT imaging of the brain was performed following IV bolus contrast injection. Subsequent parametric perfusion maps were calculated using RAPID software. CONTRAST:  151m OMNIPAQUE IOHEXOL 350 MG/ML SOLN COMPARISON:  CT head without contrast 10/31/2019. FINDINGS: CTA NECK FINDINGS Aortic arch: A 4 vessel arch configuration is present. Left vertebral artery originates directly from the arch. No significant atherosclerotic calcification or stenosis is present. Right carotid system: The right common carotid artery is within normal limits. Atherosclerotic changes are present at the proximal right ICA without a significant stenosis. Cervical right ICA is otherwise. Left carotid system: The left common carotid artery is within normal limits. Minimal atherosclerotic changes are present without significant stenosis. Cervical left ICA is otherwise normal. Vertebral arteries: Right vertebral artery originates from the subclavian artery without significant stenosis. It is the dominant vessel. No significant stenosis is present in either vertebral artery in the neck. Skeleton: Mild degenerative changes are present at C5-6. Uncovertebral spurring is present the right C4-5. No focal lytic or blastic lesions are  present. Other neck: The soft tissues the neck are otherwise unremarkable. Upper chest: The lung apices are clear. Thoracic inlet is within normal limits. Review of the MIP images confirms the above findings CTA HEAD FINDINGS Anterior circulation: The right internal carotid artery is occluded at the terminus. Clot extends beyond the right MCA bifurcation. Anterior branches opacify. Poor collateralization is evident posteriorly. The left internal carotid artery is within normal limits through the terminus. Left A1 and M1 segments are normal. Anterior communicating artery is patent. Both ACA vessels fill from the left. Retrograde right A1 flow does not reach the ICA terminus secondary to the T occlusion. Left MCA bifurcation and branch vessels are within normal limits. Posterior circulation: The right vertebral artery is dominant vessel. PICA origins are visualized and within normal limits. Basilar artery is normal. The left posterior cerebral artery originates from the basilar tip. The right posterior cerebral artery is of fetal type is fed from just below the thrombus. PCA branch vessels are within normal limits bilaterally. Venous sinuses: The dural sinuses are patent. Straight sinus deep cerebral veins are intact. Cortical veins are within normal limits. No vascular malformation is present. Anatomic variants: Fetal type right posterior cerebral artery. Review of the MIP images confirms the above findings CT Brain Perfusion Findings: ASPECTS: 4/10 CBF (<30%) Volume: 1055mPerfusion (Tmax>6.0s) volume: 20327mismatch Volume: 34m80mfarction Location:Right MCA territory IMPRESSION: 1. Confirmed large right MCA territory infarct with core volume of 107 mL. 2. Large mismatch volume of 9.6 with a mismatch ratio of  1.9. 3. T occlusion of right ICA terminus. 4. Thrombus extends through the right MCA bifurcation with poor collaterals. 5. Minimal atherosclerotic changes at the carotid bifurcations bilaterally without  significant stenosis. 6. No significant intracranial disease on the left or in the posterior circulation. 7. Fetal type right posterior cerebral artery originates just below the terminal ICA thrombus. Electronically Signed   By: San Morelle M.D.   On: 10/31/2019 18:17   CT HEAD WO CONTRAST  Addendum Date: 11/04/2019   ADDENDUM REPORT: 11/04/2019 08:31 ADDENDUM: These results were called by telephone at the time of interpretation on 11/04/2019 at 8:31 am to provider [facility] , who verbally acknowledged these results. Electronically Signed   By: Kellie Simmering DO   On: 11/04/2019 08:31   Result Date: 11/04/2019 CLINICAL DATA:  Stroke, follow-up. EXAM: CT HEAD WITHOUT CONTRAST TECHNIQUE: Contiguous axial images were obtained from the base of the skull through the vertex without intravenous contrast. COMPARISON:  Head CT 11/02/2019. FINDINGS: Brain: Interval decompressive right hemicraniectomy. Continued interval evolution of a large subacute right MCA vascular territory infarct. Continued interval increase in edema and swelling throughout much of the right MCA vascular territory. There is increased petechial hemorrhage within the infarct territory. Additionally, small volume subarachnoid hemorrhage overlying the right cerebral hemisphere has slightly increased, most notably within the right sylvian fissure. Parenchymal hemorrhage within the right basal ganglia is similar to minimally increased as compared to 11/02/2019. The largest parenchymal hematoma centered within the right lentiform nucleus now measures 4.6 x 2.2 cm (previously 4.5 x 2.0 cm). Similar degree of mass effect with partial effacement of the right greater than left lateral ventricles and third ventricle. Persistent 11 mm leftward midline shift. As before, there is early leftward subfalcine herniation. Persistent partial effacement of the right basal cisterns. No new demarcated cortical infarct is identified. No definite ventricular  entrapment on the current exam. Small volume pneumocephalus along the right temporal lobe. Vascular: No hyperdense vessel. Skull: Right hemicraniectomy. Sinuses/Orbits: Visualized orbits demonstrate no acute abnormality. Mild scattered paranasal sinus mucosal thickening. No significant mastoid effusion. IMPRESSION: 1. Postoperative sequela from interval decompressive right hemicraniectomy. 2. Continued interval evolution of a large right MCA vascular territory subacute infarct with continued interval increase in edema and swelling. Petechial hemorrhage throughout the infarct territory has increased. Small volume subarachnoid hemorrhage overlying the right cerebral hemisphere has also slightly increased. Parenchymal hemorrhage centered within the right basal ganglia is similar to slightly increased as compared to 11/02/2019. 3. Similar degree of mass effect with partial effacement of the ventricular system and 11 mm leftward midline shift. As before, there is early leftward subfalcine herniation and persistent partial effacement of the right basal cisterns. Electronically Signed: By: Kellie Simmering DO On: 11/04/2019 07:41   CT HEAD WO CONTRAST  Result Date: 11/02/2019 CLINICAL DATA:  Stroke follow-up EXAM: CT HEAD WITHOUT CONTRAST TECHNIQUE: Contiguous axial images were obtained from the base of the skull through the vertex without intravenous contrast. COMPARISON:  None. FINDINGS: Brain: Worsened edema within the right MCA territory with increased size of hyperdense collection in the right basal ganglia consistent with worsening hemorrhage. The intraparenchymal hematoma now measures 4.5 x 2.0 cm. There is 11 mm of leftward midline shift at the level of the foramen of Monro. There is early herniation of the right cingulate gyrus beneath the falx cerebri. The right lateral ventricle is effaced. No ventricular entrapment at this time. Basal cisterns remain patent. Increased amount of subarachnoid blood over the right  hemisphere. Vascular:  No abnormal hyperdensity of the major intracranial arteries or dural venous sinuses. No intracranial atherosclerosis. Skull: The visualized skull base, calvarium and extracranial soft tissues are normal. Sinuses/Orbits: No fluid levels or advanced mucosal thickening of the visualized paranasal sinuses. No mastoid or middle ear effusion. The orbits are normal. IMPRESSION: 1. Worsened edema within the right MCA territory with increased size of right basal ganglia intraparenchymal hematoma. 2. Increased amount of subarachnoid blood over the right hemisphere. 3. 11 mm of leftward midline shift at the level of the foramen of Monro with early herniation of the right cingulate gyrus beneath the falx cerebri Critical Value/emergent results were called by telephone at the time of interpretation on 11/02/2019 at 3:31 am to provider ERIC [facility] , who verbally acknowledged these results. Electronically Signed   By: Ulyses Jarred M.D.   On: 11/02/2019 03:30   CT HEAD WO CONTRAST  Result Date: 11/01/2019 CLINICAL DATA:  Stroke follow-up EXAM: CT HEAD WITHOUT CONTRAST TECHNIQUE: Contiguous axial images were obtained from the base of the skull through the vertex without intravenous contrast. COMPARISON:  CT perfusion scan 10/31/2019 FINDINGS: Brain: Large area of hypoattenuation within the right MCA territory. The right lateral ventricle is compressed. There is leftward midline shift that measures 6 mm at the level of the foramina of Monro. Multifocal hyperdensity within the right MCA territory is consistent with hemorrhage or contrast staining. The more peripheral hyperdensities are favored to be hemorrhagic. The largest of these measures 6 mm. Vascular: No hyperdense vessel or unexpected calcification. Skull: Normal. Negative for fracture or focal lesion. Sinuses/Orbits: No acute finding. Other: None. IMPRESSION: 1. Large area of hypoattenuation within the right MCA territory, consistent with subacute  infarct. 2. Multifocal hyperdensity within the right MCA territory is consistent with hemorrhage or contrast staining. The more peripheral hyperdensities are favored to be hemorrhagic. 3. 6 mm leftward midline shift at the level of the foramina of Monro. Electronically Signed   By: Ulyses Jarred M.D.   On: 11/01/2019 02:45   CT Code Stroke CTA Neck W/WO contrast  Result Date: 10/31/2019 CLINICAL DATA:  Last seen normal 5 hours ago. New onset left-sided weakness. Left facial droop and gaze. EXAM: CT ANGIOGRAPHY HEAD AND NECK CT PERFUSION BRAIN TECHNIQUE: Multidetector CT imaging of the head and neck was performed using the standard protocol during bolus administration of intravenous contrast. Multiplanar CT image reconstructions and MIPs were obtained to evaluate the vascular anatomy. Carotid stenosis measurements (when applicable) are obtained utilizing NASCET criteria, using the distal internal carotid diameter as the denominator. Multiphase CT imaging of the brain was performed following IV bolus contrast injection. Subsequent parametric perfusion maps were calculated using RAPID software. CONTRAST:  112m OMNIPAQUE IOHEXOL 350 MG/ML SOLN COMPARISON:  CT head without contrast 10/31/2019. FINDINGS: CTA NECK FINDINGS Aortic arch: A 4 vessel arch configuration is present. Left vertebral artery originates directly from the arch. No significant atherosclerotic calcification or stenosis is present. Right carotid system: The right common carotid artery is within normal limits. Atherosclerotic changes are present at the proximal right ICA without a significant stenosis. Cervical right ICA is otherwise. Left carotid system: The left common carotid artery is within normal limits. Minimal atherosclerotic changes are present without significant stenosis. Cervical left ICA is otherwise normal. Vertebral arteries: Right vertebral artery originates from the subclavian artery without significant stenosis. It is the dominant  vessel. No significant stenosis is present in either vertebral artery in the neck. Skeleton: Mild degenerative changes are present at C5-6. Uncovertebral spurring is  present the right C4-5. No focal lytic or blastic lesions are present. Other neck: The soft tissues the neck are otherwise unremarkable. Upper chest: The lung apices are clear. Thoracic inlet is within normal limits. Review of the MIP images confirms the above findings CTA HEAD FINDINGS Anterior circulation: The right internal carotid artery is occluded at the terminus. Clot extends beyond the right MCA bifurcation. Anterior branches opacify. Poor collateralization is evident posteriorly. The left internal carotid artery is within normal limits through the terminus. Left A1 and M1 segments are normal. Anterior communicating artery is patent. Both ACA vessels fill from the left. Retrograde right A1 flow does not reach the ICA terminus secondary to the T occlusion. Left MCA bifurcation and branch vessels are within normal limits. Posterior circulation: The right vertebral artery is dominant vessel. PICA origins are visualized and within normal limits. Basilar artery is normal. The left posterior cerebral artery originates from the basilar tip. The right posterior cerebral artery is of fetal type is fed from just below the thrombus. PCA branch vessels are within normal limits bilaterally. Venous sinuses: The dural sinuses are patent. Straight sinus deep cerebral veins are intact. Cortical veins are within normal limits. No vascular malformation is present. Anatomic variants: Fetal type right posterior cerebral artery. Review of the MIP images confirms the above findings CT Brain Perfusion Findings: ASPECTS: 4/10 CBF (<30%) Volume: 19m Perfusion (Tmax>6.0s) volume: 2060mMismatch Volume: 9642mnfarction Location:Right MCA territory IMPRESSION: 1. Confirmed large right MCA territory infarct with core volume of 107 mL. 2. Large mismatch volume of 9.6 with a  mismatch ratio of 1.9. 3. T occlusion of right ICA terminus. 4. Thrombus extends through the right MCA bifurcation with poor collaterals. 5. Minimal atherosclerotic changes at the carotid bifurcations bilaterally without significant stenosis. 6. No significant intracranial disease on the left or in the posterior circulation. 7. Fetal type right posterior cerebral artery originates just below the terminal ICA thrombus. Electronically Signed   By: ChrSan MorelleD.   On: 10/31/2019 18:17   IR CT Head Ltd  Result Date: 11/03/2019 INDICATION: [age]ar old female with past medical history significant for hypertension, diabetes mellitus type 2 and chronic AFib on Coumadin, status post pacemaker. She presented to ED with sudden onset right gaze deviation and left-sided weakness on 10/31/2018. Her last seen well was at 1 p.m. on the same day. No intravenous tPA administered given use of Coumadin. NIHSS 14; baseline modified Rankin scale 0. 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 Extensive discussion held with neuro hospitalist attending, patient's daughter and patient's husband. Family informed of the large infarct already established and the irreversible nature of the injury to most of the ischemic tissue. Expected outcomes discussed such as hemiplegia, possible need for neurosurgical intervention, dependency and even death. It was explained that revascularization could potentially save a small area of spared cortex in the high convexity, but that there was a significant chance that treatment could be futile or result in worsened outcome related to possible complication or increased chance of hemorrhagic transformation. Family understood and requested to proceed with endovascular intervention. Informed consent was signed by patient's husband. EXAM: Diagnostic cerebral angiogram Emergency mechanical thrombectomy Flat panel head  CT COMPARISON:  CT/CT angiogram of the head and neck for 6 in 2021. MEDICATIONS: None. ANESTHESIA/SEDATION: General anesthesia performed. CONTRAST:  70 mL Omnipaque 240 FLUOROSCOPY TIME:  Fluoroscopy Time: 42 minutes 42 seconds (937 mGy). COMPLICATIONS:  None immediate. TECHNIQUE: Informed written consent was obtained from the patient's husband after a thorough discussion of the procedural risks, benefits and alternatives. All questions were addressed. Maximal Sterile Barrier Technique was utilized including caps, mask, sterile gowns, sterile gloves, sterile drape, hand hygiene and skin antiseptic. A timeout was performed prior to the initiation of the procedure. Using a micropuncture kit and the modified Seldinger technique, access was gained to the right common femoral artery and an 8 French sheath was placed. Then, an [place] balloon guide catheter was navigated over a 6 [place] Berenstein 2 catheter and a 0.035 inch Terumo Glidewire into the aortic arch under fluoroscopic guidance. The catheter was placed into the right common carotid artery and then advanced into the right internal carotid artery. Frontal and lateral angiograms of the head were obtained. FINDINGS: Right ICA occlusion just distal to the origin of the anterior choroidal artery. PROCEDURE: Under biplane roadmap, a large bore aspiration catheter was navigated over a phenom 21 microcatheter and a synchro support microguidewire into the cavernous segment of the right ICA. The microcatheter was then navigated over the wire into the right M1/MCA. Then, a 6 x 40 mm solitaire stent retriever was deployed spanning the supraclinoid right ICA and M1 segment. The device was allowed to intercalated with the clot for 4 minutes. The microcatheter was removed. The aspiration catheter was advanced to the level of occlusion and connected to a penumbra aspiration pump. The guiding catheter balloon was inflated. The thrombectomy device and aspiration catheter  were removed under constant aspiration. Right ICA angiogram showed persistent left ICA terminus occlusion. Under biplane roadmap, a large bore aspiration catheter was navigated over a phenom 21 microcatheter and a synchro support microguidewire into the cavernous segment of the right ICA. The microcatheter was then navigated over the wire into the right M1/MCA. Then, a 5 x 37 mm embotrap stent retriever was deployed spanning the supraclinoid right ICA and M1 segment. The device was allowed to intercalated with the clot for 4 minutes. The microcatheter was removed. The aspiration catheter was advanced to the level of occlusion and connected to a penumbra aspiration pump. The guiding catheter balloon was inflated. The thrombectomy device and aspiration catheter were removed under constant aspiration. Right ICA angiogram showed recanalization of the ICA terminus and A1 segment with persistent occlusion of the M1 segment. Embolus to the right pericallosal artery (A3 segment) was noted. Under biplane roadmap, a large bore aspiration catheter was navigated over a phenom 21 microcatheter and a synchro support microguidewire into the cavernous segment of the right ICA. The microcatheter was then navigated over the wire into the right M1/MCA. Then, a 5 x 37 mm embotrap stent retriever was deployed spanning the supraclinoid right ICA and M1 segment. The device was allowed to intercalated with the clot for 4 minutes. The microcatheter was removed. The aspiration catheter was advanced to the level of occlusion and connected to a penumbra aspiration pump. The guiding catheter balloon was inflated. The thrombectomy device and aspiration catheter were removed under constant aspiration. Follow-up right ICA angiograms showed complete recanalization of the right MCA vascular tree with migration of the A3 segment embolus to the A4 segment. Under biplane roadmap, a large bore aspiration catheter was navigated over a 3 max aspiration  catheter and a synchro support microguidewire into the cavernous segment of the right ICA. The 3 max was then navigated over the wire into the pericallosal artery A4 segment at the level of occlusionand connected to a penumbra  aspiration pump. The guiding catheter balloon was inflated. After 5 minutes of continuous aspiration, the 3 max was retracted. Follow-up right ICA angiogram showed complete recanalization of the right ACA vascular tree with persistent recanalization of the MCA vascular tree (TICI 3). Flat panel CT of the head was obtained and post processed in a separate workstation with concurrent attending physician supervision. Selected images were sent to PACS. Mild swelling and contrast staining of the right MCA territory cortical ribbon, basal ganglia and amygdala was noted with partial effacement of the right lateral ventricle without midline shift. No subarachnoid hemorrhage seen. Right common femoral artery angiograms with right anterior oblique and lateral views showed access at the level of the common femoral artery which has normal caliber, adequate for for device utilization. The 8 French sheath was exchanged over the wire for a Perclose ProGlide which was utilized for access closure. Immediate hemostasis was achieved. IMPRESSION: 1. Successful mechanical thrombectomy/aspiration of a right ICA terminus "T" occlusion. A total of 3 stent retriever with combined aspiration passes and 1 direct contact aspiration performed for a complete (TICI3) recanalization. 2. No embolus to new territory. 3. No hemorrhagic complication on postprocedural flat panel CT. Although, early ischemic changes related to ongoing infarct with cortical and basal ganglia swelling and contrast staining are noted on postprocedural flat panel CT with partial effacement of the right lateral ventricle. PLAN: Transferred to ICU for close monitoring and swell watch. Electronically Signed   By: Pedro Earls M.D.   On:  11/03/2019 14:44   CT Code Stroke Cerebral Perfusion with contrast  Result Date: 10/31/2019 CLINICAL DATA:  Last seen normal 5 hours ago. New onset left-sided weakness. Left facial droop and gaze. EXAM: CT ANGIOGRAPHY HEAD AND NECK CT PERFUSION BRAIN TECHNIQUE: Multidetector CT imaging of the head and neck was performed using the standard protocol during bolus administration of intravenous contrast. Multiplanar CT image reconstructions and MIPs were obtained to evaluate the vascular anatomy. Carotid stenosis measurements (when applicable) are obtained utilizing NASCET criteria, using the distal internal carotid diameter as the denominator. Multiphase CT imaging of the brain was performed following IV bolus contrast injection. Subsequent parametric perfusion maps were calculated using RAPID software. CONTRAST:  162m OMNIPAQUE IOHEXOL 350 MG/ML SOLN COMPARISON:  CT head without contrast 10/31/2019. FINDINGS: CTA NECK FINDINGS Aortic arch: A 4 vessel arch configuration is present. Left vertebral artery originates directly from the arch. No significant atherosclerotic calcification or stenosis is present. Right carotid system: The right common carotid artery is within normal limits. Atherosclerotic changes are present at the proximal right ICA without a significant stenosis. Cervical right ICA is otherwise. Left carotid system: The left common carotid artery is within normal limits. Minimal atherosclerotic changes are present without significant stenosis. Cervical left ICA is otherwise normal. Vertebral arteries: Right vertebral artery originates from the subclavian artery without significant stenosis. It is the dominant vessel. No significant stenosis is present in either vertebral artery in the neck. Skeleton: Mild degenerative changes are present at C5-6. Uncovertebral spurring is present the right C4-5. No focal lytic or blastic lesions are present. Other neck: The soft tissues the neck are otherwise  unremarkable. Upper chest: The lung apices are clear. Thoracic inlet is within normal limits. Review of the MIP images confirms the above findings CTA HEAD FINDINGS Anterior circulation: The right internal carotid artery is occluded at the terminus. Clot extends beyond the right MCA bifurcation. Anterior branches opacify. Poor collateralization is evident posteriorly. The left internal carotid artery is within  normal limits through the terminus. Left A1 and M1 segments are normal. Anterior communicating artery is patent. Both ACA vessels fill from the left. Retrograde right A1 flow does not reach the ICA terminus secondary to the T occlusion. Left MCA bifurcation and branch vessels are within normal limits. Posterior circulation: The right vertebral artery is dominant vessel. PICA origins are visualized and within normal limits. Basilar artery is normal. The left posterior cerebral artery originates from the basilar tip. The right posterior cerebral artery is of fetal type is fed from just below the thrombus. PCA branch vessels are within normal limits bilaterally. Venous sinuses: The dural sinuses are patent. Straight sinus deep cerebral veins are intact. Cortical veins are within normal limits. No vascular malformation is present. Anatomic variants: Fetal type right posterior cerebral artery. Review of the MIP images confirms the above findings CT Brain Perfusion Findings: ASPECTS: 4/10 CBF (<30%) Volume: 161m Perfusion (Tmax>6.0s) volume: 2079mMismatch Volume: 9681mnfarction Location:Right MCA territory IMPRESSION: 1. Confirmed large right MCA territory infarct with core volume of 107 mL. 2. Large mismatch volume of 9.6 with a mismatch ratio of 1.9. 3. T occlusion of right ICA terminus. 4. Thrombus extends through the right MCA bifurcation with poor collaterals. 5. Minimal atherosclerotic changes at the carotid bifurcations bilaterally without significant stenosis. 6. No significant intracranial disease on the  left or in the posterior circulation. 7. Fetal type right posterior cerebral artery originates just below the terminal ICA thrombus. Electronically Signed   By: ChrSan MorelleD.   On: 10/31/2019 18:17   DG Chest Port 1 View  Result Date: 11/04/2019 CLINICAL DATA:  Respiratory failure with hypoxia. EXAM: PORTABLE CHEST 1 VIEW COMPARISON:  11/03/2019 FINDINGS: ET tube tip is above the carina. There is a left arm PICC line with tip at the cavoatrial junction. Right chest wall pacer device is noted with leads in the right atrial appendage and right ventricle. Cardiac enlargement. New left lung base retrocardiac opacity is identified which obscures the left hemidiaphragm concerning for left lower lobe atelectasis and or consolidation. IMPRESSION: 1. Complete opacification of the retrocardiac left lung base worrisome for left lower lobe atelectasis. 2. The left arm PICC line tip is at the cavoatrial junction. Electronically Signed   By: TayKerby MoorsD.   On: 11/04/2019 10:13   DG Chest Port 1 View  Result Date: 11/03/2019 CLINICAL DATA:  Acute respiratory failure with hypoxia. EXAM: PORTABLE CHEST 1 VIEW COMPARISON:  November 02, 2019. FINDINGS: Stable cardiomediastinal silhouette. No pneumothorax or pleural effusion is noted. Endotracheal and nasogastric tubes are unchanged in position. Left-sided PICC line is unchanged. Stable right-sided pacemaker. Both lungs are clear. The visualized skeletal structures are unremarkable. IMPRESSION: Stable support apparatus. No acute cardiopulmonary abnormality seen. Electronically Signed   By: JamMarijo ConceptionD.   On: 11/03/2019 08:10   DG CHEST PORT 1 VIEW  Result Date: 11/02/2019 CLINICAL DATA:  Intubated EXAM: PORTABLE CHEST 1 VIEW COMPARISON:  Chest radiograph from one day prior. FINDINGS: Stable configuration of 2 lead right subclavian pacemaker. Intact sternotomy wires. Left PICC terminates over the cavoatrial junction. Endotracheal tube tip is 2.1 cm  above the carina. Enteric tube terminates in the proximal stomach. Stable cardiomediastinal silhouette with mild cardiomegaly. No pneumothorax. No pleural effusion. No overt pulmonary edema. No acute consolidative airspace disease. IMPRESSION: 1. Well-positioned support structures. No pneumothorax. 2. Stable mild cardiomegaly without overt pulmonary edema. No active pulmonary disease. Electronically Signed   By: JasIlona SorrelD.   On:  11/02/2019 11:51   DG CHEST PORT 1 VIEW  Result Date: 11/01/2019 CLINICAL DATA:  PICC repositioning EXAM: PORTABLE CHEST 1 VIEW COMPARISON:  Chest radiograph from earlier today. FINDINGS: Well-positioned left PICC with tip overlying the cavoatrial junction. Intact sternotomy wires. Stable configuration of 2 lead right subclavian pacemaker. Stable cardiomediastinal silhouette with borderline mild cardiomegaly. No pneumothorax. No pleural effusion. Lungs appear clear, with no acute consolidative airspace disease and no pulmonary edema. IMPRESSION: 1. Well-positioned left PICC with tip overlying the cavoatrial junction. 2. Borderline mild cardiomegaly. No pulmonary edema. No active pulmonary disease. Electronically Signed   By: Ilona Sorrel M.D.   On: 11/01/2019 18:47   DG CHEST PORT 1 VIEW  Result Date: 11/01/2019 CLINICAL DATA:  PICC placement EXAM: PORTABLE CHEST 1 VIEW COMPARISON:  Chest radiograph from one day prior. FINDINGS: Stable configuration of 2 lead right subclavian pacemaker. Intact sternotomy wires. Malpositioned left PICC with tip oriented superiorly in high right mediastinum. Stable cardiomediastinal silhouette with top-normal heart size. No pneumothorax. No pleural effusion. No overt pulmonary edema. No acute consolidative airspace disease. IMPRESSION: 1. Malpositioned left PICC with tip oriented superiorly in the high right mediastinum, recommend repositioning. 2. No active cardiopulmonary disease. These results were called by telephone at the time of  interpretation on 11/01/2019 at 5:59 pm to provider [facility] , who verbally acknowledged these results. Electronically Signed   By: Ilona Sorrel M.D.   On: 11/01/2019 18:04   DG Chest Port 1 View  Result Date: 10/31/2019 CLINICAL DATA:  Stroke. EXAM: PORTABLE CHEST 1 VIEW COMPARISON:  11/26/2008 FINDINGS: Right-sided pacemaker in place. Post median sternotomy. Mild cardiomegaly. No pulmonary edema, focal airspace disease, pleural effusion or pneumothorax. Bones appear under mineralized. IMPRESSION: Mild cardiomegaly. No acute chest findings. Electronically Signed   By: Keith Rake M.D.   On: 10/31/2019 21:54   ECHOCARDIOGRAM COMPLETE  Result Date: 11/02/2019    ECHOCARDIOGRAM REPORT   Patient Name:   ASIYAH PINEAU Date of Exam: 11/02/2019 Medical Rec #:  [id number]        Height:       64.0 in Accession #:    [id number]       Weight:       140.2 lb Date of Birth:  11-Oct-1955         BSA:          1.682 m Patient Age:    66 years         BP:           111/73 mmHg Patient Gender: F                HR:           82 bpm. Exam Location:  Inpatient Procedure: 2D Echo, Cardiac Doppler and Color Doppler Indications:    Stroke 434.91/I163.9  History:        Patient has prior history of Echocardiogram examinations, most                 recent 05/27/2018. Pacemaker, Mitral Valve Disease; Risk                 Factors:Diabetes, Hypertension and Dyslipidemia. S/p mitral                 valve repair.  Sonographer:    Clayton Lefort RDCS (AE) Referring Phys: [id number] [place]  Sonographer Comments: Echo performed with patient supine and on artificial respirator. IMPRESSIONS  1. Left ventricular ejection fraction, by  estimation, is 50 to 55%. The left ventricle has low normal function. There is moderate left ventricular hypertrophy. Left ventricular diastolic parameters are indeterminate.  2. Right ventricular systolic function is moderately reduced. The right ventricular size is normal.  3. Left atrial size was  moderately dilated.  4. The mitral valve has been repaired. There is a prosthetic annuloplasty ring present in the mitral position. No evidence of mitral valve regurgitation. The mean mitral valve gradient is 4.0 mmHg.  5. Tricuspid valve regurgitation is moderate.  6. The aortic valve was not well visualized. Aortic valve regurgitation is not visualized. No aortic stenosis is present. FINDINGS  Left Ventricle: Left ventricular ejection fraction, by estimation, is 50 to 55%. The left ventricle has low normal function. The left ventricle has no regional wall motion abnormalities. The left ventricular internal cavity size was small. There is moderate left ventricular hypertrophy. Left ventricular diastolic parameters are indeterminate. Right Ventricle: The right ventricular size is normal. Right vetricular wall thickness was not assessed. Right ventricular systolic function is moderately reduced. There is mildly elevated pulmonary artery systolic pressure. The tricuspid regurgitant velocity is 2.76 m/s, and with an assumed right atrial pressure of 10 mmHg, the estimated right ventricular systolic pressure is 16.3 mmHg. Left Atrium: Left atrial size was moderately dilated. Right Atrium: Right atrial size was normal in size. Pericardium: Trivial pericardial effusion is present. Mitral Valve: The mitral valve has been repaired/replaced. No evidence of mitral valve regurgitation. There is a prosthetic annuloplasty ring present in the mitral position. MV peak gradient, 10.5 mmHg. The mean mitral valve gradient is 4.0 mmHg. Tricuspid Valve: The tricuspid valve is normal in structure. Tricuspid valve regurgitation is moderate. Aortic Valve: The aortic valve was not well visualized. Aortic valve regurgitation is not visualized. No aortic stenosis is present. Aortic valve mean gradient measures 2.0 mmHg. Aortic valve peak gradient measures 4.5 mmHg. Aortic valve area, by VTI measures 1.89 cm. Pulmonic Valve: The pulmonic  valve was not well visualized. Pulmonic valve regurgitation is not visualized. Aorta: The aortic root and ascending aorta are structurally normal, with no evidence of dilitation. Venous: IVC assessment for right atrial pressure unable to be performed due to mechanical ventilation. IAS/Shunts: No atrial level shunt detected by color flow Doppler.  LEFT VENTRICLE PLAX 2D LVIDd:         3.60 cm LVIDs:         2.70 cm LV PW:         2.00 cm LV IVS:        1.40 cm LVOT diam:     1.90 cm LV SV:         37 LV SV Index:   22 LVOT Area:     2.84 cm  RIGHT VENTRICLE            IVC RV Basal diam:  2.80 cm    IVC diam: 2.40 cm RV S prime:     5.51 cm/s TAPSE (M-mode): 1.0 cm LEFT ATRIUM              Index       RIGHT ATRIUM           Index LA diam:        4.60 cm  2.73 cm/m  RA Area:     15.00 cm LA Vol (A2C):   101.0 ml 60.04 ml/m RA Volume:   38.50 ml  22.89 ml/m LA Vol (A4C):   52.8 ml  31.39 ml/m LA Biplane Vol:  74.5 ml  44.29 ml/m  AORTIC VALVE AV Area (Vmax):    1.82 cm AV Area (Vmean):   1.77 cm AV Area (VTI):     1.89 cm AV Vmax:           106.00 cm/s AV Vmean:          74.400 cm/s AV VTI:            0.198 m AV Peak Grad:      4.5 mmHg AV Mean Grad:      2.0 mmHg LVOT Vmax:         68.14 cm/s LVOT Vmean:        46.340 cm/s LVOT VTI:          0.132 m LVOT/AV VTI ratio: 0.67  AORTA Ao Root diam: 3.00 cm Ao Asc diam:  3.00 cm MITRAL VALVE            TRICUSPID VALVE MV Peak grad: 10.5 mmHg TR Peak grad:   30.5 mmHg MV Mean grad: 4.0 mmHg  TR Vmax:        276.00 cm/s MV Vmax:      1.62 m/s MV Vmean:     91.8 cm/s SHUNTS                         Systemic VTI:  0.13 m                         Systemic Diam: 1.90 cm Oswaldo Milian MD Electronically signed by Oswaldo Milian MD Signature Date/Time: 11/02/2019/7:09:04 PM    Final    CUP PACEART INCLINIC DEVICE CHECK  Result Date: 10/30/2019 Pacemaker check in clinic. Normal device function. Thresholds, sensing, impedances consistent with previous  measurements. Device programmed to maximize longevity. No high ventricular rates noted. Device programmed at appropriate safety margins. Histogram  distribution appropriate for patient activity level. Device programmed to optimize intrinsic conduction. Estimated longevity4 years. Patient enrolled in remote follow-up. Patient education completed. ROV w/ WC.Lavenia Atlas, BSN, RN  IR PERCUTANEOUS ART THROMBECTOMY/INFUSION INTRACRANIAL INC DIAG ANGIO  Result Date: 11/03/2019 INDICATION: 64 year old female with past medical history significant for hypertension, diabetes mellitus type 2 and chronic AFib on Coumadin, status post pacemaker. She presented to ED with sudden onset right gaze deviation and left-sided weakness on 10/31/2018. Her last seen well was at 1 p.m. on the same day. No intravenous tPA administered given use of Coumadin. NIHSS 14; baseline modified Rankin scale 0. 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 Extensive discussion held with neuro hospitalist attending, patient's daughter and patient's husband. Family informed of the large infarct already established and the irreversible nature of the injury to most of the ischemic tissue. Expected outcomes discussed such as hemiplegia, possible need for neurosurgical intervention, dependency and even death. It was explained that revascularization could potentially save a small area of spared cortex in the high convexity, but that there was a significant chance that treatment could be futile or result in worsened outcome related to possible complication or increased chance of hemorrhagic transformation. Family understood and requested to proceed with endovascular intervention. Informed consent was signed by patient's husband. EXAM: Diagnostic cerebral angiogram Emergency mechanical thrombectomy Flat panel head CT COMPARISON:  CT/CT angiogram of the head and neck for 6 in  2021. MEDICATIONS: None. ANESTHESIA/SEDATION: General anesthesia performed. CONTRAST:  70 mL  Omnipaque 240 FLUOROSCOPY TIME:  Fluoroscopy Time: 42 minutes 42 seconds (937 mGy). COMPLICATIONS: None immediate. TECHNIQUE: Informed written consent was obtained from the patient's husband after a thorough discussion of the procedural risks, benefits and alternatives. All questions were addressed. Maximal Sterile Barrier Technique was utilized including caps, mask, sterile gowns, sterile gloves, sterile drape, hand hygiene and skin antiseptic. A timeout was performed prior to the initiation of the procedure. Using a micropuncture kit and the modified Seldinger technique, access was gained to the right common femoral artery and an 8 French sheath was placed. Then, an [place] balloon guide catheter was navigated over a 6 [place] Berenstein 2 catheter and a 0.035 inch Terumo Glidewire into the aortic arch under fluoroscopic guidance. The catheter was placed into the right common carotid artery and then advanced into the right internal carotid artery. Frontal and lateral angiograms of the head were obtained. FINDINGS: Right ICA occlusion just distal to the origin of the anterior choroidal artery. PROCEDURE: Under biplane roadmap, a large bore aspiration catheter was navigated over a phenom 21 microcatheter and a synchro support microguidewire into the cavernous segment of the right ICA. The microcatheter was then navigated over the wire into the right M1/MCA. Then, a 6 x 40 mm solitaire stent retriever was deployed spanning the supraclinoid right ICA and M1 segment. The device was allowed to intercalated with the clot for 4 minutes. The microcatheter was removed. The aspiration catheter was advanced to the level of occlusion and connected to a penumbra aspiration pump. The guiding catheter balloon was inflated. The thrombectomy device and aspiration catheter were removed under constant aspiration. Right ICA angiogram  showed persistent left ICA terminus occlusion. Under biplane roadmap, a large bore aspiration catheter was navigated over a phenom 21 microcatheter and a synchro support microguidewire into the cavernous segment of the right ICA. The microcatheter was then navigated over the wire into the right M1/MCA. Then, a 5 x 37 mm embotrap stent retriever was deployed spanning the supraclinoid right ICA and M1 segment. The device was allowed to intercalated with the clot for 4 minutes. The microcatheter was removed. The aspiration catheter was advanced to the level of occlusion and connected to a penumbra aspiration pump. The guiding catheter balloon was inflated. The thrombectomy device and aspiration catheter were removed under constant aspiration. Right ICA angiogram showed recanalization of the ICA terminus and A1 segment with persistent occlusion of the M1 segment. Embolus to the right pericallosal artery (A3 segment) was noted. Under biplane roadmap, a large bore aspiration catheter was navigated over a phenom 21 microcatheter and a synchro support microguidewire into the cavernous segment of the right ICA. The microcatheter was then navigated over the wire into the right M1/MCA. Then, a 5 x 37 mm embotrap stent retriever was deployed spanning the supraclinoid right ICA and M1 segment. The device was allowed to intercalated with the clot for 4 minutes. The microcatheter was removed. The aspiration catheter was advanced to the level of occlusion and connected to a penumbra aspiration pump. The guiding catheter balloon was inflated. The thrombectomy device and aspiration catheter were removed under constant aspiration. Follow-up right ICA angiograms showed complete recanalization of the right MCA vascular tree with migration of the A3 segment embolus to the A4 segment. Under biplane roadmap, a large bore aspiration catheter was navigated over a 3 max aspiration catheter and a synchro support microguidewire into the  cavernous segment of the right ICA. The 3 max was then navigated over the wire into  the pericallosal artery A4 segment at the level of occlusionand connected to a penumbra aspiration pump. The guiding catheter balloon was inflated. After 5 minutes of continuous aspiration, the 3 max was retracted. Follow-up right ICA angiogram showed complete recanalization of the right ACA vascular tree with persistent recanalization of the MCA vascular tree (TICI 3). Flat panel CT of the head was obtained and post processed in a separate workstation with concurrent attending physician supervision. Selected images were sent to PACS. Mild swelling and contrast staining of the right MCA territory cortical ribbon, basal ganglia and amygdala was noted with partial effacement of the right lateral ventricle without midline shift. No subarachnoid hemorrhage seen. Right common femoral artery angiograms with right anterior oblique and lateral views showed access at the level of the common femoral artery which has normal caliber, adequate for for device utilization. The 8 French sheath was exchanged over the wire for a Perclose ProGlide which was utilized for access closure. Immediate hemostasis was achieved. IMPRESSION: 1. Successful mechanical thrombectomy/aspiration of a right ICA terminus "T" occlusion. A total of 3 stent retriever with combined aspiration passes and 1 direct contact aspiration performed for a complete (TICI3) recanalization. 2. No embolus to new territory. 3. No hemorrhagic complication on postprocedural flat panel CT. Although, early ischemic changes related to ongoing infarct with cortical and basal ganglia swelling and contrast staining are noted on postprocedural flat panel CT with partial effacement of the right lateral ventricle. PLAN: Transferred to ICU for close monitoring and swell watch. Electronically Signed   By: Pedro Earls M.D.   On: 11/03/2019 14:44   CT HEAD CODE STROKE WO  CONTRAST  Result Date: 10/31/2019 CLINICAL DATA:  Code stroke. Acute onset of left-sided facial droop and abnormal gaze. EXAM: CT HEAD WITHOUT CONTRAST TECHNIQUE: Contiguous axial images were obtained from the base of the skull through the vertex without intravenous contrast. COMPARISON:  None. FINDINGS: Brain: A large right MCA infarct is noted. There is hypoattenuation involving the right caudate head, internal capsule, and lentiform nucleus. Hypoattenuation is seen at the insular ribbon, right superior temporal gyrus, and super ganglionic posterior right frontal lobe. No acute hemorrhage is present. No mass effect scratched at there is some partial effacement of the sulci. No midline shift is present. Basal ganglia are normal on the left. The brainstem and cerebellum are within normal limits. The ventricles are of normal size. No significant extraaxial fluid collection is present. Vascular: A hyperdense right MCA and ICA terminus is noted. Hyperdensity extends beyond the right MCA bifurcation. The left MCA is unremarkable. Atherosclerotic calcifications are present within the cavernous internal carotid arteries bilaterally. Skull: 1 Calvarium is intact. No focal lytic or blastic lesions are present. No significant extracranial soft tissue lesion is present. Sinuses/Orbits: The paranasal sinuses and mastoid air cells are clear. The globes and orbits are within normal limits. ASPECTS [facility] Stroke Program Early CT Score) - Ganglionic level infarction (caudate, lentiform nuclei, internal capsule, insula, M1-M3 cortex): 2/7 - Supraganglionic infarction (M4-M6 cortex): 2/3 Total score (0-10 with 10 being normal): 4/10 IMPRESSION: 1. Large right MCA territory nonhemorrhagic infarct is described. 2. Hyperdense right MCA suggesting extensive thrombus from the right ICA terminus through the MCA bifurcation 3. Some mass effect with partial effacement the sulci on the right. No midline shift. 4. ASPECTS is 4/10 The  above was relayed via text pager to Dr. Roland Rack on 10/31/2019 at 17:59 . Electronically Signed   By: San Morelle M.D.   On: 10/31/2019  18:03   [place] EKG SITE RITE  Result Date: 11/01/2019 If Site Rite image not attached, placement could not be confirmed due to current cardiac rhythm.  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  Permanent AF with RVR Chronically, rates are well controlled.  Now RVR in the setting of acute illness/stroke.  Increase diltiazem to 60 mg q 6 hours.  Continue amiodarone gtt at 60 mg/hr for now as long as BP tolerates.  Violet resumption per neurology once stable.  She has previously refused NOACs due to side effect profiles, would be worth a re-discussion once patient awake and alert now due to "failed" coumadin.   2. 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 Acute hypoxemic respiratory failure Remains on vent. Not extubation candidate currently per CCM.  4. Hypotension Lopressor on hold.  Titrate diltiazem as needed for HR.   Dr. Curt Bears to see later today. I discussed the above plan with him in detail.   For questions or updates, please contact [place] Please consult [email or web address] for contact info under Cardiology/STEMI.  Signed, Shirley Friar, PA-C  11/05/2019 12:23 PM      I have seen and examined this patient with Oda Kilts.  Agree with above, note added to reflect my findings.  On exam, tachycardic, irregular, no murmurs. Patient presented with CVA, likely embolic from AF and subtherapeutic INR.   Her AF is permanent and this not a candidate for rhythm  control. Agree with amiodarone for rate control. Her BP has improved and thus on diltazem. She is volume 4L overloaded and as BP tolerates, would diurese. May require diltiazem ggt as BP tolerates. I would imagine that as she recovers, her HR Audric Venn also improve.  Emeree Mahler M. Carrin Vannostrand MD 11/05/2019 5:27 PM

## 2019-11-05 NOTE — Progress Notes (Signed)
PT Cancellation Note  Patient Details Name: Krystal Little MRN: [id number] DOB: Aug 19, 1955   Cancelled Treatment:    Reason Eval/Treat Not Completed: Medical issues which prohibited therapy. Pt with unstable HR despite medication, pt remains more lethargic than previous days and demo's minimal command following. PT to return as able, as appropriate to progress mobility.  [name], PT, DPT Acute Rehabilitation Services Pager #: [phone] Office #: [phone]    [name] 11/05/2019, 11:42 AM

## 2019-11-05 NOTE — Progress Notes (Signed)
STROKE TEAM PROGRESS NOTE   INTERVAL HISTORY [name].  Patient still intubated lying in bed, A. fib RVR and BP on the lower side.  Cardiology consulted for assistance.  Patient not as responsive as yesterday, not following commands on the right side today.  OBJECTIVE Vitals:   11/05/19 1200 11/05/19 1230 11/05/19 1300 11/05/19 1330  BP: 95/67 99/61 90/66  107/70  Pulse: (!) 128 (!) 113 (!) 121 (!) 107  Resp: 17 17 18 18   Temp: 99.4 F (37.4 C)     TempSrc: Axillary     SpO2: 99% 99% 99% 99%  Weight:      Height:        CBC:  Recent Labs  Lab 10/31/19 1744 10/31/19 1805 11/04/19 0400 11/05/19 0324  WBC 5.7   < > 13.6* 10.7*  NEUTROABS 4.1  --   --   --   HGB 14.3   < > 11.1* 10.1*  HCT 43.1   < > 34.1* 31.0*  MCV 91.3   < > 95.0 96.9  PLT 151   < > 109* 87*   < > = values in this interval not displayed.    Basic Metabolic Panel:  Recent Labs  Lab 11/02/19 0243 11/02/19 0343 11/04/19 0938 11/04/19 1655 11/05/19 0324 11/05/19 1219  NA 174*   < > 153*   < > 155* 154*  K  --    < > 4.5  --  3.9  --   CL  --    < > 126*  --  127*  --   CO2  --    < > 21*  --  24  --   GLUCOSE  --    < > 313*  --  320*  --   BUN  --    < > 28*  --  28*  --   CREATININE  --    < > 0.83  --  0.95  --   CALCIUM  --    < > 8.3*  --  8.4*  --   MG 1.9  --   --   --  2.2  --    < > = values in this interval not displayed.    Lipid Panel:     Component Value Date/Time   CHOL 239 (H) 11/01/2019 0348   CHOL 212 (H) 07/15/2018 0907   TRIG 102 11/04/2019 0639   HDL 69 11/01/2019 0348   HDL 50 07/15/2018 0907   CHOLHDL 3.5 11/01/2019 0348   VLDL 12 11/01/2019 0348   LDLCALC 158 (H) 11/01/2019 0348   LDLCALC 138 (H) 07/15/2018 0907   HgbA1c:  Lab Results  Component Value Date   HGBA1C 9.7 (H) 11/01/2019   Urine Drug Screen:     Component Value Date/Time   LABOPIA NONE DETECTED 10/31/2019 1820   COCAINSCRNUR NONE DETECTED 10/31/2019 1820   LABBENZ NONE DETECTED  10/31/2019 1820   AMPHETMU NONE DETECTED 10/31/2019 1820   THCU NONE DETECTED 10/31/2019 1820   LABBARB NONE DETECTED 10/31/2019 1820    Alcohol Level     Component Value Date/Time   ETH <10 10/31/2019 1744    IMAGING CT HEAD CODE STROKE WO CONTRAST 10/31/2019 IMPRESSION:  1. Large right MCA territory nonhemorrhagic infarct is described.  2. Hyperdense right MCA suggesting extensive thrombus from the right ICA terminus through the MCA bifurcation  3. Some mass effect with partial effacement the sulci on the right. No midline shift.  4. ASPECTS  is 4/10   CT Code Stroke CTA Head W/WO contrast CT Code Stroke CTA Neck W/WO contrast CT Code Stroke Cerebral Perfusion with contrast 10/31/2019 IMPRESSION:  1. Confirmed large right MCA territory infarct with core volume of 107 mL.  2. Large mismatch volume of 9.6 with a mismatch ratio of 1.9.  3. T occlusion of right ICA terminus.  4. Thrombus extends through the right MCA bifurcation with poor collaterals.  5. Minimal atherosclerotic changes at the carotid bifurcations bilaterally without significant stenosis.  6. No significant intracranial disease on the left or in the posterior circulation.  7. Fetal type right posterior cerebral artery originates just below the terminal ICA thrombus.   Interventional Neuro Radiology - Cerebral Angiogram with Intervention - de Rosario Jacks, MD 10/31/2019 8:20 PM ICA terminus occlusion. Mechanical thrombectomy performed with 2 stent retriever + aspiration passes in the MCA-ICA (solitaire and embotrap), 1 stent retriever + aspiration pass MCA (embotrap) and 1 direct contact aspiration in the right ACA. Complete recanalization achieved (TICI3).  CT HEAD WO CONTRAST 11/01/2019 IMPRESSION:  1. Large area of hypoattenuation within the right MCA territory, consistent with subacute infarct.  2. Multifocal hyperdensity within the right MCA territory is consistent with hemorrhage or contrast  staining. The more peripheral hyperdensities are favored to be hemorrhagic.  3. 6 mm leftward midline shift at the level of the foramina of [place].   CT Head WO Contrast 11/02/2019 3:30 AM IMPRESSION: 1. Worsened edema within the right MCA territory with increased size of right basal ganglia intraparenchymal hematoma. 2. Increased amount of subarachnoid blood over the right hemisphere. 3. 11 mm of leftward midline shift at the level of the foramen of [place]ro with early herniation of the right cingulate gyrus beneath the falx cerebri  CT Head WO Contrast 11/04/2019  1. Postoperative sequela from interval decompressive right hemicraniectomy. 2. Continued interval evolution of a large right MCA vascular territory subacute infarct with continued interval increase in edema and swelling. Petechial hemorrhage throughout the infarct territory has increased. Small volume subarachnoid hemorrhage overlying the right cerebral hemisphere has also slightly increased. Parenchymal hemorrhage centered within the right basal ganglia is similar to slightly increased as compared to 11/02/2019. 3. Similar degree of mass effect with partial effacement of the ventricular system and 11 mm leftward midline shift. As before, there is early leftward subfalcine herniation and persistent partial effacement of the right basal cisterns.  DG Chest Port 1 View 11/06/2019 pending 11/04/2019 1. Complete opacification of the retrocardiac left lung base worrisome for left lower lobe atelectasis. 2. The left arm PICC line tip is at the cavoatrial junction.  11/03/2019 Stable support apparatus. No acute cardiopulmonary abnormality seen.  11/02/2019 1. Well-positioned support structures. No pneumothorax. 2. Stable mild cardiomegaly without overt pulmonary edema. No active pulmonary disease.  11/01/2019 1. Well-positioned left PICC with tip overlying the cavoatrial junction. 2. Borderline mild cardiomegaly. No pulmonary edema. No active  pulmonary disease.  11/01/2019 1. Malpositioned left PICC with tip oriented superiorly in the high right mediastinum, recommend repositioning. 2. No active cardiopulmonary disease.  10/31/2019 Mild cardiomegaly. No acute chest findings.    CUP PACEART INCLINIC DEVICE CHECK  10/30/2019 Pacemaker check in clinic. Normal device function. Thresholds, sensing, impedances consistent with previous measurements. Device programmed to maximize longevity. No high ventricular rates noted. Device programmed at appropriate safety margins. Histogram  distribution appropriate for patient activity level. Device programmed to optimize intrinsic conduction. Estimated longevity4 years. Patient enrolled in remote follow-up. Patient education completed. ROV w/ WC.Leigh  Juleen China, BSN, RN  ECHOCARDIOGRAM COMPLETE 11/02/2019 1. Left ventricular ejection fraction, by estimation, is 50 to 55%. The left ventricle has low normal function. There is moderate left ventricular hypertrophy. Left ventricular diastolic parameters are indeterminate.  2. Right ventricular systolic function is moderately reduced. The right ventricular size is normal.  3. Left atrial size was moderately dilated.  4. The mitral valve has been repaired. There is a prosthetic annuloplasty ring present in the mitral position. No evidence of mitral valve regurgitation. The mean mitral valve gradient is 4.0 mmHg.  5. Tricuspid valve regurgitation is moderate.  6. The aortic valve was not well visualized. Aortic valve regurgitation is not visualized. No aortic stenosis is present.    PHYSICAL EXAM  Temp:  [98.6 F (37 C)-100.1 F (37.8 C)] 99.4 F (37.4 C) (04/21 1200) Pulse Rate:  [80-141] 107 (04/21 1330) Resp:  [16-29] 18 (04/21 1330) BP: (87-126)/(44-87) 107/70 (04/21 1330) SpO2:  [96 %-100 %] 99 % (04/21 1330) FiO2 (%):  [40 %] 40 % (04/21 1137)  General - Well nourished, well developed, intubated on fentanyl.  Ophthalmologic - fundi not visualized  due to noncooperation.  Cardiovascular - irregularly irregular heart rate and rhythm with RVR.  Neuro - intubated on fentanyl, eyes closed, not open with stimulation. Right eyelid swollen. Did not follow simple commands today. With eye forced opening, eyes in right gaze position, not blinking to visual threat, doll's eyes sluggish, not tracking, bilateral pupil 14mm, sluggish to light. Corneal reflex weak bilateral, gag and cough present. Breathing over the vent.  Facial symmetry not able to test due to ET tube.  Tongue protrusion not cooperative. On pain stimulation, RUE and RLE slight withdraw, no movement of LUE and LLE. DTR 1+ and bilateral babinski. Sensation, coordination and gait not tested.   ASSESSMENT/PLAN Ms. EMERALD SHOR is a 64 y.o. female with history of HTN, DM2, hx of mitral valve repair, chronic a. Fib (coumadin - INR 1.2 on admission) s/p pacemaker who presented to [facility] ED as a code stroke for right gaze deviation, urinary incontinence and left sided weakness. She did not receive IV t-PA due to anticoagulation with coumadin. IR -> ICA terminus occlusion. Mechanical thrombectomy performed with 2 stent retriever + aspiration passes in the MCA-ICA (solitaire and embotrap), 1 stent retriever + aspiration pass MCA (embotrap) and 1 direct contact aspiration in the right ACA. Complete recanalization achieved (TICI3).  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 Hyperdense right MCA suggesting extensive thrombus from the right ICA terminus through the MCA bifurcation Some mass effect with partial effacement the sulci on the right. No midline shift. ASPECTS is 4/10   CTA H&N - Total occlusion of right ICA terminus. Thrombus extends through the right MCA bifurcation with poor  collaterals.   CT Perfusion - Confirmed large right MCA territory infarct with core volume of 107 mL. Large mismatch volume of 9.6 with a mismatch ratio of 1.9.   CT head -  Large area of hypoattenuation within the right MCA territory, consistent with subacute infarct. Multifocal hyperdensity within the right MCA territory is consistent with hemorrhage or contrast staining. The more peripheral hyperdensities are favored to be hemorrhagic. 6 mm leftward midline shift  CT Head 11/02/19 - Worsened edema within the right MCA territory with increased size of right  basal ganglia intraparenchymal hematoma. Increased amount of subarachnoid blood over the right hemisphere. 11 mm of leftward midline shift  MRI head - not performed - PPM   CT head 4/20 postop changed from R crani. Evolution large R MCA infarct w/ interval increase edema/swelling. Increased petechial hemorrhage throughout. SAH also increased. R BG HT slightly increased. Mass effect, partial effacement ventricles w/ 20mm L midline shift   CT pending 4/22  2D Echo - EF 50-55%. No source of embolus. 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   Sars Corona Virus 2 - negative  LDL - 158  HgbA1c - 9.7  UDS - negative  VTE prophylaxis - SCDs  warfarin daily prior to admission, now on No antithrombotic  Therapy recommendations:  CIR - admission coordinator following  Disposition:  Pending  Acute hypoxemia respiratory failure d/t stroke  Intubated for emergent hemicraniectomy for cerebral edema  CCM on board  On fentanyl  CXR 4/20 - Complete opacification of the retrocardiac left lung base worrisome for left lower lobe atelectasis.  CXR 4/22 pending  Cerebral edema w/ subfalcine herniation s/p decompressive hemicraniectomy  CT repeat showed 6 mm Midline Shift -> 11 mm  3% saline @ 50 cc/hr -> NS @ 50  Na 138->141->148->147->157->155->153->157->156->153->154->155->154  NSG on board s/p [facility] 4/18  Sodium goal  150-160  Sodium every 6 hours  PICC line placed  23.4% x 2  Atrial fibrillation w/ RVR  Telemetry showed A. fib, rate controlled  On Coumadin PTA, family stated compliance  Subtherapeutic INR 1.2 on presentation -> 1.4  No AC at the time due to large infarct and hemorrhagic conversion (previously refused DOACs)  metoprolol 25 bid->50 bid -> d/c due to low BP  On cardizem 60 q 8h->60 q 6h  On amiodarone gtt  EP Cardiology consulted - appreciate recs  Hypertension->Hypotension  Home BP meds: Atenolol; Cardizem ; lisinopril  Current BP meds: cardizem   Off cleviprex  Metoprolol and lisinopril d/c due to low BP  Stable on the low end . SBP goal < 160 mm Hg . Long-term BP goal normotensive  Hyperlipidemia  Home Lipid lowering medication: zetia  LDL 158, goal < 70  Hold off statin for know due to left hemorrhagic conversion  Consider statin at discharge  Diabetes, uncontrolled  Home diabetic meds: metformin  Current diabetic meds: lantus 10, SSI   HgbA1c 9.7, goal < 7.0  CBG monitoring  Hyperglycemia - add lantus 10->20, novolog 4u->7u q4h  Continue SSI  DM coordinator consulted  Dysphagia  N.p.o. for now  OG placed  On TF @ 53  Speech following  Oligouria, improved  Low urine output 4/20  Likely due to low BP  Gave NS bolus 500  D/c metoprolol and lisinopril   BP monitoring with cuff pressure  Other Stroke Risk Factors  Advanced age  ETOH use, advised to drink no more than 1 alcoholic beverage per day.  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 fib, dysphagia and at  significant risk of neurological worsening, death form brain herniation, cerebral edema, recurrent stroke, hematoma expansion, heart failure, aspiration pneumonia, shock. This patient's care requires constant monitoring of vital signs, hemodynamics, respiratory and cardiac monitoring, review of multiple databases, neurological  assessment, discussion with family, other specialists and medical decision making of high complexity. I spent 40 minutes of neurocritical care time in the care of this patient. I had long discussion with daughter at bedside, updated pt current condition, treatment plan and potential prognosis, and answered all the questions.  She expressed understanding and appreciation.   Rosalin Hawking, MD PhD Stroke Neurology 11/05/2019 2:07 PM   To contact Stroke Continuity provider, please refer to [email or web address] After hours, contact General Neurology

## 2019-11-06 ENCOUNTER — Inpatient Hospital Stay (HOSPITAL_COMMUNITY): Payer: Medicare Other

## 2019-11-06 DIAGNOSIS — J9601 Acute respiratory failure with hypoxia: Secondary | ICD-10-CM | POA: Diagnosis not present

## 2019-11-06 DIAGNOSIS — I63411 Cerebral infarction due to embolism of right middle cerebral artery: Secondary | ICD-10-CM | POA: Diagnosis not present

## 2019-11-06 DIAGNOSIS — I482 Chronic atrial fibrillation, unspecified: Secondary | ICD-10-CM | POA: Diagnosis not present

## 2019-11-06 DIAGNOSIS — E876 Hypokalemia: Secondary | ICD-10-CM | POA: Diagnosis not present

## 2019-11-06 LAB — CBC
HCT: 27.7 % — ABNORMAL LOW (ref 36.0–46.0)
Hemoglobin: 9 g/dL — ABNORMAL LOW (ref 12.0–15.0)
MCH: 31.7 pg (ref 26.0–34.0)
MCHC: 32.5 g/dL (ref 30.0–36.0)
MCV: 97.5 fL (ref 80.0–100.0)
Platelets: 75 10*3/uL — ABNORMAL LOW (ref 150–400)
RBC: 2.84 MIL/uL — ABNORMAL LOW (ref 3.87–5.11)
RDW: 15.2 % (ref 11.5–15.5)
WBC: 8.7 10*3/uL (ref 4.0–10.5)
nRBC: 0.2 % (ref 0.0–0.2)

## 2019-11-06 LAB — BASIC METABOLIC PANEL
Anion gap: 6 (ref 5–15)
BUN: 16 mg/dL (ref 8–23)
CO2: 24 mmol/L (ref 22–32)
Calcium: 7.9 mg/dL — ABNORMAL LOW (ref 8.9–10.3)
Chloride: 123 mmol/L — ABNORMAL HIGH (ref 98–111)
Creatinine, Ser: 0.65 mg/dL (ref 0.44–1.00)
GFR calc Af Amer: >60 mL/min (ref >60)
GFR calc non Af Amer: >60 mL/min (ref >60)
Glucose, Bld: 271 mg/dL — ABNORMAL HIGH (ref 70–99)
Potassium: 3.3 mmol/L — ABNORMAL LOW (ref 3.5–5.1)
Sodium: 153 mmol/L — ABNORMAL HIGH (ref 135–145)

## 2019-11-06 LAB — GLUCOSE, CAPILLARY
Glucose-Capillary: 218 mg/dL — ABNORMAL HIGH (ref 70–99)
Glucose-Capillary: 191 mg/dL — ABNORMAL HIGH (ref 70–99)
Glucose-Capillary: 206 mg/dL — ABNORMAL HIGH (ref 70–99)
Glucose-Capillary: 270 mg/dL — ABNORMAL HIGH (ref 70–99)
Glucose-Capillary: 235 mg/dL — ABNORMAL HIGH (ref 70–99)
Glucose-Capillary: 244 mg/dL — ABNORMAL HIGH (ref 70–99)

## 2019-11-06 LAB — SODIUM
Sodium: 151 mmol/L — ABNORMAL HIGH (ref 135–145)
Sodium: 152 mmol/L — ABNORMAL HIGH (ref 135–145)
Sodium: 154 mmol/L — ABNORMAL HIGH (ref 135–145)

## 2019-11-06 LAB — TROPONIN I (HIGH SENSITIVITY): Troponin I (High Sensitivity): 39 ng/L — ABNORMAL HIGH (ref <18)

## 2019-11-06 LAB — MAGNESIUM: Magnesium: 2.2 mg/dL (ref 1.7–2.4)

## 2019-11-06 LAB — LACTIC ACID, PLASMA: Lactic Acid, Venous: 1.7 mmol/L (ref 0.5–1.9)

## 2019-11-06 LAB — PHOSPHORUS: Phosphorus: 2 mg/dL — ABNORMAL LOW (ref 2.5–4.6)

## 2019-11-06 LAB — PROCALCITONIN: Procalcitonin: 0.88 ng/mL

## 2019-11-06 MED ORDER — POTASSIUM PHOSPHATES 15 MMOLE/5ML IV SOLN
20.0000 mmol | Freq: Once | INTRAVENOUS | Status: AC
  Administered 2019-11-06: 11:00:00 20 mmol via INTRAVENOUS
  Filled 2019-11-06: qty 6.67

## 2019-11-06 MED ORDER — FENTANYL CITRATE (PF) 100 MCG/2ML IJ SOLN
25.0000 ug | INTRAMUSCULAR | Status: DC | PRN
  Administered 2019-11-13 – 2019-11-14 (×2): 25 ug via INTRAVENOUS
  Filled 2019-11-06 (×2): qty 2

## 2019-11-06 MED ORDER — AMIODARONE HCL 200 MG PO TABS: 400.0000 mg | ORAL_TABLET | Freq: Every day | ORAL | Status: DC

## 2019-11-06 MED ORDER — INSULIN GLARGINE 100 UNIT/ML ~~LOC~~ SOLN
15.0000 [IU] | Freq: Two times a day (BID) | SUBCUTANEOUS | Status: DC
  Administered 2019-11-06 – 2019-11-07 (×2): 15 [IU] via SUBCUTANEOUS
  Filled 2019-11-06 (×3): qty 0.15

## 2019-11-06 MED ORDER — METOPROLOL TARTRATE 25 MG PO TABS
25.0000 mg | ORAL_TABLET | Freq: Two times a day (BID) | ORAL | Status: DC
  Administered 2019-11-06 (×2): 25 mg
  Filled 2019-11-06 (×2): qty 1

## 2019-11-06 MED ORDER — FUROSEMIDE 10 MG/ML IJ SOLN
40.0000 mg | Freq: Three times a day (TID) | INTRAMUSCULAR | Status: DC
  Administered 2019-11-06 – 2019-11-10 (×12): 40 mg via INTRAVENOUS
  Filled 2019-11-06 (×12): qty 4

## 2019-11-06 MED ORDER — AMIODARONE HCL 200 MG PO TABS
400.0000 mg | ORAL_TABLET | Freq: Two times a day (BID) | ORAL | Status: DC
  Administered 2019-11-06 – 2019-11-10 (×10): 400 mg
  Filled 2019-11-06 (×10): qty 2

## 2019-11-06 MED ORDER — METOPROLOL TARTRATE 25 MG PO TABS: 25.0000 mg | ORAL_TABLET | Freq: Two times a day (BID) | ORAL | Status: DC

## 2019-11-06 MED ORDER — INSULIN ASPART 100 UNIT/ML ~~LOC~~ SOLN
0.0000 [IU] | SUBCUTANEOUS | Status: DC
  Administered 2019-11-06: 20:00:00 7 [IU] via SUBCUTANEOUS
  Administered 2019-11-06: 11 [IU] via SUBCUTANEOUS
  Administered 2019-11-06: 12:00:00 7 [IU] via SUBCUTANEOUS
  Administered 2019-11-07 (×3): 4 [IU] via SUBCUTANEOUS
  Administered 2019-11-07: 3 [IU] via SUBCUTANEOUS
  Administered 2019-11-07 (×2): 7 [IU] via SUBCUTANEOUS
  Administered 2019-11-08: 12:00:00 4 [IU] via SUBCUTANEOUS
  Administered 2019-11-08 (×2): 7 [IU] via SUBCUTANEOUS
  Administered 2019-11-08: 16:00:00 11 [IU] via SUBCUTANEOUS
  Administered 2019-11-08: 01:00:00 7 [IU] via SUBCUTANEOUS
  Administered 2019-11-08: 08:00:00 4 [IU] via SUBCUTANEOUS
  Administered 2019-11-09: 3 [IU] via SUBCUTANEOUS
  Administered 2019-11-09: 20:00:00 7 [IU] via SUBCUTANEOUS
  Administered 2019-11-09 (×2): 4 [IU] via SUBCUTANEOUS
  Administered 2019-11-09: 3 [IU] via SUBCUTANEOUS
  Administered 2019-11-10: 05:00:00 4 [IU] via SUBCUTANEOUS
  Administered 2019-11-10 (×2): 11 [IU] via SUBCUTANEOUS
  Administered 2019-11-10: 12:00:00 7 [IU] via SUBCUTANEOUS

## 2019-11-06 NOTE — Progress Notes (Signed)
Occupational Therapy Treatment Patient Details Name: Krystal Little MRN: [id number] DOB: [birth year] Today's Date: 11/06/2019    History of present illness 64 y.o. female presented to [facility] ED as a code stroke for  Right gaze deviation and left side weakness. Pt s/p R hemicraniectomy with bone flap placed in abdomen on 4/18.  PMH HTN, DM2, chronic a. Fib (coumadin) s/p pace maker.   OT comments  Pt progressing towards established OT goals. Pt requiring Total A for ADLs and bed mobility. Tolerating sitting at EOB for ~10 minutes with Max A for support. After oral care, pt with increased arousal and following commands to perform RUE and RLE exercises. Pt's family very supportive. Pt keeping her eyes closed, but with assist to maintain eye open, pt able to track R to midline with Max cues. Continue to recommend dc to CIR and will continue to follow acutely as admitted.    Follow Up Recommendations  CIR;Supervision/Assistance - 24 hour    Equipment Recommendations  Other (comment)(Defer to next venue)    Recommendations for Other Services PT consult;Rehab consult;Speech consult    Precautions / Restrictions Precautions Precautions: Fall Precaution Comments: previous bleeding from cariotomy site Restrictions Weight Bearing Restrictions: No       Mobility Bed Mobility Overal bed mobility: Needs Assistance Bed Mobility: Supine to Sit;Sit to Supine   Sidelying to sit: Total assist;+2 for physical assistance Supine to sit: Total assist;+2 for physical assistance     General bed mobility comments: Total A to manage trunk and BLEs  Transfers Overall transfer level: Needs assistance Equipment used: 2 person hand held assist Transfers: Sit to/from Stand Sit to Stand: Total assist;+2 physical assistance         General transfer comment: Total A +2 to stand with blocking at Bil knees and use of bad for elevating hips    Balance Overall balance assessment: Needs  assistance Sitting-balance support: Feet supported;Single extremity supported Sitting balance-Leahy Scale: Zero Sitting balance - Comments: totalA Postural control: Posterior lean;Right lateral lean Standing balance support: Bilateral upper extremity supported Standing balance-Leahy Scale: Zero Standing balance comment: totalA x 2                           ADL either performed or assessed with clinical judgement   ADL Overall ADL's : Needs assistance/impaired     Grooming: Oral care;Total assistance;Sitting Grooming Details (indicate cue type and reason): Max A for sitting balance. Total A for use of mouth swab                               General ADL Comments: Continues to require Total A for ADLs and bed mobility     Vision   Vision Assessment?: Vision impaired- to be further tested in functional context Additional Comments: Pt keeping her eye closed. With assistance to open eye lids, pt able to track her daughter from right to midline. Preference for right gaze.   Perception     Praxis      Cognition Arousal/Alertness: Lethargic(becomes more alert with stimulation) Behavior During Therapy: Flat affect Overall Cognitive Status: Difficult to assess Area of Impairment: Following commands                       Following Commands: Follows one step commands consistently;Follows one step commands with increased time(once alert after sitting up and performing oral care)  General Comments: Pt with decreased arousal initially. After oral care at EOB, pt more alert and following simple, one step commands to perform exercises at RUE/RLE and visual tracking        Exercises Exercises: General Upper Extremity;Other exercises;General Lower Extremity General Exercises - Upper Extremity Elbow Flexion: AROM;Right;10 reps;Seated Elbow Extension: AROM;Right;10 reps;Seated General Exercises - Lower Extremity Long Arc Quad: AROM;Right;5  reps;Seated Other Exercises Other Exercises: Cervical ROM; left and right; with assistance   Shoulder Instructions       General Comments Pt on VENT, 40% FiO2, 5 PEEP. Daughter present throughout and very supportive/involved.    Pertinent Vitals/ Pain       Pain Assessment: Faces Faces Pain Scale: No hurt Pain Intervention(s): Monitored during session  Home Living                                          Prior Functioning/Environment              Frequency  Min 2X/week        Progress Toward Goals  OT Goals(current goals can now be found in the care plan section)  Progress towards OT goals: Progressing toward goals  Acute Rehab OT Goals Patient Stated Goal: Get better; per daughter OT Goal Formulation: With family Time For Goal Achievement: 11/17/19 Potential to Achieve Goals: Good ADL Goals Pt Will Perform Grooming: with mod assist;sitting Pt Will Transfer to Toilet: with mod assist;stand pivot transfer;bedside commode Pt Will Perform Toileting - Clothing Manipulation and hygiene: with mod assist;sit to/from stand;sitting/lateral leans Additional ADL Goal #1: Pt will perform bed mobility with Min A +2 in preparation for ADLs Additional ADL Goal #2: Pt will tolerate sitting at EOB for 10 minute with Min A in preparation for ADLs Additional ADL Goal #3: Pt will follow one step commands with Mod cues during ADLs  Plan Discharge plan remains appropriate    Co-evaluation    PT/OT/SLP Co-Evaluation/Treatment: Yes Reason for Co-Treatment: For patient/therapist safety;To address functional/ADL transfers;Complexity of the patient's impairments (multi-system involvement)   OT goals addressed during session: ADL's and self-care      AM-PAC OT "6 Clicks" Daily Activity     Outcome Measure   Help from another person eating meals?: Total Help from another person taking care of personal grooming?: Total Help from another person toileting, which  includes using toliet, bedpan, or urinal?: Total Help from another person bathing (including washing, rinsing, drying)?: Total Help from another person to put on and taking off regular upper body clothing?: Total Help from another person to put on and taking off regular lower body clothing?: Total 6 Click Score: 6    End of Session Equipment Utilized During Treatment: Other (comment)(Vent)  OT Visit Diagnosis: Unsteadiness on feet (R26.81);Other abnormalities of gait and mobility (R26.89);Muscle weakness (generalized) (M62.81)   Activity Tolerance Patient limited by fatigue   Patient Left in bed;with call bell/phone within reach;with nursing/sitter in room;with bed alarm set;with restraints reapplied   Nurse Communication Mobility status        Time: 2595-6387 OT Time Calculation (min): 30 min  Charges: OT General Charges $OT Visit: 1 Visit OT Treatments $Self Care/Home Management : 8-22 mins  [place], OTR/L Acute Rehab Pager: [phone] Office: [place] 11/06/2019, 3:27 PM

## 2019-11-06 NOTE — Progress Notes (Signed)
SLP Cancellation Note  Patient Details Name: Krystal Little MRN: [id number] DOB: 10/01/1955   Cancelled treatment:  Pt remains intubated.  SLP will sign off. Please reconsult when indicated.   Jatorian Renault L. Tivis Ringer, [place] CCC/SLP Acute Rehabilitation Services Office number [phone] Pager [phone]          Juan Quam Laurice 11/06/2019, 9:23 AM

## 2019-11-06 NOTE — Progress Notes (Signed)
Neurology note reviewed Discussed with Dr Curt Bears - no new recs today - we will follow from a distance  Chanetta Marshall, NP 11/06/2019 8:16 AM

## 2019-11-06 NOTE — Progress Notes (Signed)
STROKE TEAM PROGRESS NOTE   INTERVAL HISTORY Husband and daughter are at the bedside. Pt still intubated on vent, but weaning, eyes closed but able to follow commands on the right, able to work with PT/OT a little. Na 153. CXR showed b/l pleural effusion. BP improved on metoprolol now, HR under control. However, her hb and platelet were down trending.   OBJECTIVE Vitals:   11/06/19 0700 11/06/19 0800 11/06/19 0807 11/06/19 0900  BP: 111/70 136/83 136/83 112/64  Pulse: 90 (!) 108 (!) 118 (!) 104  Resp: 18 18 (!) 21 18  Temp:  98.7 F (37.1 C)    TempSrc:  Axillary    SpO2: 100% 100% 100% 100%  Weight:      Height:        CBC:  Recent Labs  Lab 10/31/19 1744 10/31/19 1805 11/05/19 0324 11/06/19 0400  WBC 5.7   < > 10.7* 8.7  NEUTROABS 4.1  --   --   --   HGB 14.3   < > 10.1* 9.0*  HCT 43.1   < > 31.0* 27.7*  MCV 91.3   < > 96.9 97.5  PLT 151   < > 87* 75*   < > = values in this interval not displayed.    Basic Metabolic Panel:  Recent Labs  Lab 11/05/19 0324 11/05/19 1219 11/06/19 0007 11/06/19 0400  NA 155*   < > 154* 153*  K 3.9  --   --  3.3*  CL 127*  --   --  123*  CO2 24  --   --  24  GLUCOSE 320*  --   --  271*  BUN 28*  --   --  16  CREATININE 0.95  --   --  0.65  CALCIUM 8.4*  --   --  7.9*  MG 2.2  --   --  2.2  PHOS  --   --   --  2.0*   < > = values in this interval not displayed.    Lipid Panel:     Component Value Date/Time   CHOL 239 (H) 11/01/2019 0348   CHOL 212 (H) 07/15/2018 0907   TRIG 102 11/04/2019 0639   HDL 69 11/01/2019 0348   HDL 50 07/15/2018 0907   CHOLHDL 3.5 11/01/2019 0348   VLDL 12 11/01/2019 0348   LDLCALC 158 (H) 11/01/2019 0348   LDLCALC 138 (H) 07/15/2018 0907   HgbA1c:  Lab Results  Component Value Date   HGBA1C 9.7 (H) 11/01/2019   Urine Drug Screen:     Component Value Date/Time   LABOPIA NONE DETECTED 10/31/2019 1820   COCAINSCRNUR NONE DETECTED 10/31/2019 1820   LABBENZ NONE DETECTED 10/31/2019 1820    AMPHETMU NONE DETECTED 10/31/2019 1820   THCU NONE DETECTED 10/31/2019 1820   LABBARB NONE DETECTED 10/31/2019 1820    Alcohol Level     Component Value Date/Time   ETH <10 10/31/2019 1744    IMAGING CT HEAD CODE STROKE WO CONTRAST 10/31/2019 IMPRESSION:  1. Large right MCA territory nonhemorrhagic infarct is described.  2. Hyperdense right MCA suggesting extensive thrombus from the right ICA terminus through the MCA bifurcation  3. Some mass effect with partial effacement the sulci on the right. No midline shift.  4. ASPECTS is 4/10   CT Code Stroke CTA Head W/WO contrast CT Code Stroke CTA Neck W/WO contrast CT Code Stroke Cerebral Perfusion with contrast 10/31/2019 IMPRESSION:  1. Confirmed large right MCA territory infarct with  core volume of 107 mL.  2. Large mismatch volume of 9.6 with a mismatch ratio of 1.9.  3. T occlusion of right ICA terminus.  4. Thrombus extends through the right MCA bifurcation with poor collaterals.  5. Minimal atherosclerotic changes at the carotid bifurcations bilaterally without significant stenosis.  6. No significant intracranial disease on the left or in the posterior circulation.  7. Fetal type right posterior cerebral artery originates just below the terminal ICA thrombus.   Interventional Neuro Radiology - Cerebral Angiogram with Intervention - de Rosario Jacks, MD 10/31/2019 8:20 PM ICA terminus occlusion. Mechanical thrombectomy performed with 2 stent retriever + aspiration passes in the MCA-ICA (solitaire and embotrap), 1 stent retriever + aspiration pass MCA (embotrap) and 1 direct contact aspiration in the right ACA. Complete recanalization achieved (TICI3).  CT HEAD WO CONTRAST 11/01/2019 1. Large area of hypoattenuation within the right MCA territory, consistent with subacute infarct.  2. Multifocal hyperdensity within the right MCA territory is consistent with hemorrhage or contrast staining. The more peripheral  hyperdensities are favored to be hemorrhagic.  3. 6 mm leftward midline shift at the level of the foramina of [place]ro.  11/02/2019 3:30 AM 1. Worsened edema within the right MCA territory with increased size of right basal ganglia intraparenchymal hematoma. 2. Increased amount of subarachnoid blood over the right hemisphere. 3. 11 mm of leftward midline shift at the level of the foramen of [place]ro with early herniation of the right cingulate gyrus beneath the falx cerebri 11/04/2019  1. Postoperative sequela from interval decompressive right hemicraniectomy. 2. Continued interval evolution of a large right MCA vascular territory subacute infarct with continued interval increase in edema and swelling. Petechial hemorrhage throughout the infarct territory has increased. Small volume subarachnoid hemorrhage overlying the right cerebral hemisphere has also slightly increased. Parenchymal hemorrhage centered within the right basal ganglia is similar to slightly increased as compared to 11/02/2019. 3. Similar degree of mass effect with partial effacement of the ventricular system and 11 mm leftward midline shift. As before, there is early leftward subfalcine herniation and persistent partial effacement of the right basal cisterns. 11/06/2019  1. Stable appearance of large right MCA territory infarct with diffuse petechial hemorrhage. 2. Stable mass effect and midline shift. 3. Stable mild prominence of the left lateral ventricle.   DG Chest Port 1 View 11/06/2019 1.  Lines and tubes in stable position. 2. Bilateral pulmonary infiltrates/edema and bilateral pleural effusions noted on today's exam. 3. Cardiac pacer stable position. Prior median sternotomy. Heart size stable. 11/04/2019 1. Complete opacification of the retrocardiac left lung base worrisome for left lower lobe atelectasis. 2. The left arm PICC line tip is at the cavoatrial junction.  11/03/2019 Stable support apparatus. No acute cardiopulmonary  abnormality seen.  11/02/2019 1. Well-positioned support structures. No pneumothorax. 2. Stable mild cardiomegaly without overt pulmonary edema. No active pulmonary disease.  11/01/2019 1. Well-positioned left PICC with tip overlying the cavoatrial junction. 2. Borderline mild cardiomegaly. No pulmonary edema. No active pulmonary disease.  11/01/2019 1. Malpositioned left PICC with tip oriented superiorly in the high right mediastinum, recommend repositioning. 2. No active cardiopulmonary disease.  10/31/2019 Mild cardiomegaly. No acute chest findings.    CUP PACEART INCLINIC DEVICE CHECK  10/30/2019 Pacemaker check in clinic. Normal device function. Thresholds, sensing, impedances consistent with previous measurements. Device programmed to maximize longevity. No high ventricular rates noted. Device programmed at appropriate safety margins. Histogram  distribution appropriate for patient activity level. Device programmed to optimize intrinsic conduction. Estimated  longevity4 years. Patient enrolled in remote follow-up. Patient education completed. ROV w/ WC.Lavenia Atlas, BSN, RN  ECHOCARDIOGRAM COMPLETE 11/02/2019 1. Left ventricular ejection fraction, by estimation, is 50 to 55%. The left ventricle has low normal function. There is moderate left ventricular hypertrophy. Left ventricular diastolic parameters are indeterminate.  2. Right ventricular systolic function is moderately reduced. The right ventricular size is normal.  3. Left atrial size was moderately dilated.  4. The mitral valve has been repaired. There is a prosthetic annuloplasty ring present in the mitral position. No evidence of mitral valve regurgitation. The mean mitral valve gradient is 4.0 mmHg.  5. Tricuspid valve regurgitation is moderate.  6. The aortic valve was not well visualized. Aortic valve regurgitation is not visualized. No aortic stenosis is present.    PHYSICAL EXAM   Temp:  [98.6 F (37 C)-100.1 F (37.8 C)] 98.7  F (37.1 C) (04/22 0800) Pulse Rate:  [90-139] 104 (04/22 0900) Resp:  [17-30] 18 (04/22 0900) BP: (90-201)/(54-146) 112/64 (04/22 0900) SpO2:  [94 %-100 %] 100 % (04/22 0900) FiO2 (%):  [30 %-50 %] 50 % (04/22 0807) Weight:  [67.4 kg] 67.4 kg (04/22 0500)  General - Well nourished, well developed, intubated off sedation.  Ophthalmologic - fundi not visualized due to noncooperation.  Cardiovascular - irregularly irregular heart rate and rhythm.  Neuro - intubated off fentanyl, eyes closed, not open with stimulation. Right eyelid swollen. With eye forced opening, eyes in right gaze position, not blinking to visual threat, doll's eyes sluggish, not tracking, bilateral pupil 9mm, sluggish to light. Corneal reflex weak bilateral, gag and cough present. Breathing over the vent.  Facial symmetry not able to test due to ET tube.  Tongue protrusion not cooperative. However, she was able to follow commands on the right hand and foot. On pain stimulation, RUE and RLE mild withdraw 2/5, LUE extension posturing and LLE slight withdraw. DTR 1+ and bilateral babinski. Sensation, coordination and gait not tested.   ASSESSMENT/PLAN Ms. EMELLY WURTZ is a 64 y.o. female with history of HTN, DM2, hx of mitral valve repair, chronic a. Fib (coumadin - INR 1.2 on admission) s/p pacemaker who presented to [facility] ED as a code stroke for right gaze deviation, urinary incontinence and left sided weakness. She did not receive IV t-PA due to anticoagulation with coumadin. IR -> ICA terminus occlusion. Mechanical thrombectomy performed with 2 stent retriever + aspiration passes in the MCA-ICA (solitaire and embotrap), 1 stent retriever + aspiration pass MCA (embotrap) and 1 direct contact aspiration in the right ACA. Complete recanalization achieved (TICI3).  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 Hyperdense right MCA suggesting extensive thrombus from the right ICA terminus through the MCA bifurcation Some mass effect with partial effacement the sulci on the right. No midline shift. ASPECTS is 4/10   CTA H&N - Total occlusion of right ICA terminus. Thrombus extends through the right MCA bifurcation with poor collaterals.   CT Perfusion - Confirmed large right MCA territory infarct with core volume of 107 mL. Large mismatch volume of 9.6 with a mismatch ratio of 1.9.   CT head -  Large area of hypoattenuation within the right MCA territory, consistent with subacute infarct. Multifocal hyperdensity within the right MCA territory is consistent with hemorrhage or contrast staining. The  more peripheral hyperdensities are favored to be hemorrhagic. 6 mm leftward midline shift  CT Head 11/02/19 - Worsened edema within the right MCA territory with increased size of right basal ganglia intraparenchymal hematoma. Increased amount of subarachnoid blood over the right hemisphere. 11 mm of leftward midline shift  MRI head - not performed - PPM   CT head 4/20 postop changed from R crani. Evolution large R MCA infarct w/ interval increase edema/swelling. Increased petechial hemorrhage throughout. SAH also increased. R BG HT slightly increased. Mass effect, partial effacement ventricles w/ 24mm L midline shift   CT 4/22 stable large R MCA infarct w/ diffuse petechial hemorrhage   2D Echo - EF 50-55%. No source of embolus. 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   Sars Corona Virus 2 - negative  LDL - 158  HgbA1c - 9.7  UDS - negative  VTE prophylaxis - SCDs  warfarin daily prior to admission, now on No antithrombotic  Therapy recommendations:  CIR - admission coordinator following  Disposition:  Pending  Acute hypoxemia respiratory failure d/t stroke  Intubated for emergent  hemicraniectomy for cerebral edema  CCM on board  On fentanyl->prn sedation  CXR 4/20 - Complete opacification of the retrocardiac left lung base worrisome for left lower lobe atelectasis.  CXR 4/22 B pulm infiltrates / edema and B pleural effusions  Lasix 40 and d/c IVF  Cerebral edema w/ subfalcine herniation s/p decompressive hemicraniectomy  CT 4/22 stable large R MCA infarct w/ diffuse petechial hemorrhage  3% saline @ 50 cc/hr -> NS @ 50 -> off  Na 138->141->148->147->157->155->153->154->154->153  NSG on board s/p [facility] 4/18  Sodium goal 150-160  Sodium every 6 hours  PICC line placed  23.4% x 2  Atrial fibrillation w/ RVR  Telemetry showed A. fib, rate controlled  On Coumadin PTA, family stated compliance  Subtherapeutic INR 1.2 on presentation -> 1.4  No AC at the time due to large infarct and hemorrhagic conversion (previously refused DOACs)  metoprolol 25 bid->50 bid -> d/c -> 25 bid  On cardizem 60 q 8h->60 q 6h  On amiodarone gtt   EP Cardiology on board  Hypertension->Hypotension  Home BP meds: Atenolol; Cardizem ; lisinopril  Current BP meds: cardizem, metoprolol   Off cleviprex  lisinopril d/c due to low BP  Stable, improved  Resume metoprolol 25 bid . SBP goal < 160 mm Hg . Long-term BP goal normotensive  Hyperlipidemia  Home Lipid lowering medication: zetia  LDL 158, goal < 70  Hold off statin for know due to left hemorrhagic conversion  Consider statin at discharge  Diabetes, uncontrolled  Home diabetic meds: metformin  HgbA1c 9.7, goal < 7.0  CBG monitoring  Hyperglycemia - add lantus 10->20->15 bid, novolog 4u->7u q4h  Continue SSI 0-20  DM coordinator consulted  Anemia and thrombocytopenia   Platelet 168->134->123->109->87->75  Hb 11.5->11.1->10.1->9.0  Close monitoring  Dysphagia  N.p.o. for now  OG placed  On TF @ 87  Speech following  Other Stroke Risk Factors  Advanced age  ETOH use,  advised to drink no more than 1 alcoholic beverage per day.  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 fib, dysphagia and at significant risk  of neurological worsening, death form brain herniation, cerebral edema, recurrent stroke, hematoma expansion, heart failure, aspiration pneumonia, shock. This patient's care requires constant monitoring of vital signs, hemodynamics, respiratory and cardiac monitoring, review of multiple databases, neurological assessment, discussion with family, other specialists and medical decision making of high complexity. I spent 40 minutes of neurocritical care time in the care of this patient. I had long discussion with husband and daughter at bedside, updated pt current condition, treatment plan and potential prognosis, and answered all the questions.  They expressed understanding and appreciation.   Krystal Hawking, MD PhD Stroke Neurology 11/06/2019 11:30 AM   To contact Stroke Continuity provider, please refer to [email or web address] After hours, contact General Neurology

## 2019-11-06 NOTE — Progress Notes (Signed)
NAME:  Krystal Little, MRN:  [id number], DOB:  12-Aug-1955, LOS: 6 ADMISSION DATE:  10/31/2019, CONSULTATION DATE:  4/18 REFERRING MD:  Neurology service CHIEF COMPLAINT:  Vent dep post craniotomy    Brief History   62 yobf with hbp, dm, caf on coumadin with pacemaker found down/incontinent  p 2 h since last observed on pm 4/16> to [facility] ED as code stroke with R MCA > IR endovascular thrombectomy with revascularization with large area of edema post pocedure and progressive decrease LOC  > to OR am 4/18 for decompressive hemicraniectomy and return on vent so PCCM service asked to consult.  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   has a past surgical history that includes Mitral valve repair (07/1999); transthoracic echocardiogram (05/2018); Cardiac catheterization (08/03/1999); Pacemaker insertion (07/1999); Pacemaker generator change (11/26/2008); IR PERCUTANEOUS ART THROMBECTOMY/INFUSION INTRACRANIAL INC DIAG ANGIO (10/31/2019); Radiology with anesthesia (N/A, 10/31/2019); IR PERCUTANEOUS ART THROMBECTOMY/INFUSION INTRACRANIAL INC DIAG ANGIO (10/31/2019); [place] (10/31/2019); and Craniectomy for depressed skull fracture (Right, 11/02/2019).   Significant Hospital Events   4/16 IR thrombectomy  4/18 for decompressive hemicraniectomy 4/20 - Cleviprex off overnight along with fentanyl.  Attempted PSV wean this AM but developed tachypnea and tachycardia so started back on cleviprex and fentanyl and switched back to full support for now. 4.21  - on vent. On fent gtt. Off cleviprex x few days per rN. Overngiht A fib RVR and started on amio gtt. HR still 127 with  A fib RVR  but also hypotensive/soft spb 80s-120s. On scheduled cardizem po and prn lopressor. Na 155 - but off 3% saline. MAde only 400cc urine overnight. Husband Jess Sulak at beside - reports hx of rheumatic heart disease. Per RN - moves right side purposefully. Per husband moved LLE spontaneously x 1. On low dose fent  Gtt on vent. Doing SBT  Consults:  [facility]  EP Dr Curt Bears 4/21  Procedures:  PIC LUE 4/17 > Oral et 4/18 > L RA art line 4/18 >  Significant Diagnostic Tests:  CT head 4/18 > Worsened edema within the right MCA territory with increased size. of right basal ganglia intraparenchymal hematoma. Increased amount of subarachnoid blood over the right hemisphere. 11 mm of leftward midline shift at the level of the foramen of Monro with early herniation of the right cingulate gyrus beneath the falx cerebri CT head 4/20 > continued interval evolution of large right MCA subacute infract with continued interval increase in edema and swelling.  Petechial hemorrhage throughout the infarct has increased.  Similar parenchymal hemorrhage centered within the right basal ganglia.  Micro Data:  RVP 4/16 neg  MRSA PCR  4/17 neg   Antimicrobials:     Interim history/subjective:   11/06/2019 - seen by Dr Curt Bears yesterdya. Continue amio. Increased cardizem. Aim for diuresis when possible and consider NOAC for long term anticoagulation when stable. Off sedtaion. PT/OT workign with patient - > making her sit on side of bed but not natural tone  Objective   Blood pressure 112/64, pulse (!) 104, temperature 98.7 F (37.1 C), temperature source Axillary, resp. rate 18, height 5\' 4"  (1.626 m), weight 67.4 kg, SpO2 100 %.    Vent Mode: CPAP;PSV  FiO2 (%):  [30 %-50 %] 50 % Set Rate:  [15 bmp] 15 bmp Vt Set:  [430 mL] 430 mL PEEP:  [5 cmH20] 5 cmH20 Pressure Support:  [10 cmH20] 10 cmH20 Plateau Pressure:  [14 cmH20-17 cmH20] 16 cmH20   Intake/Output Summary (Last 24 hours) at  11/06/2019 0947 Last data filed at 11/06/2019 0800 Gross per 24 hour  Intake 3416.43 ml  Output 850 ml  Net 2566.43 ml   Filed Weights   10/31/19 1907 11/06/19 0500  Weight: 63.6 kg 67.4 kg    Examination:  General Appearance:  Looks criticall ill Head:   Rt side scalp exposed with sutures + Eyes:  PERRL - x, conjunctiva/corneas - muddy     Ears:  Normal external ear canals, both ears Nose:  G tube - no Throat:  ETT TUBE - yes , OG tube - yes Neck:  Supple,  No enlargement/tenderness/nodules Lungs: Clear to auscultation bilaterally, Ventilator   Synchrony - yes on PSV Heart:  S1 and S2 normal, no murmur, CVP - no.  Pressors - x Abdomen:  Soft, no masses, no organomegaly Genitalia / Rectal:  Not done Extremities:  Extremities- intact Skin:  ntact in exposed areas . Sacral area - not examined Neurologic:  Sedation - none -> RASS - -2 even with PT/OT making her sit with full support*      Assessment & Plan:   Acute hypoxemic respiratory failure in setting of emergent hemicraniectomy for vasogenic cerebral edema s/p R MCA/ thrombectomy.    11/06/2019 - > does not meet criteria for Extubation in setting of Acute Respiratory Failure due to acute encephalopathy due to stroke even if doing SBT    Plan -PRVC with SBT as tolertaed -- dc sedation gtt - just do prn sedation - rASS goal 0 to +1 (Currently -2)   Known permanent A Fib Rapid afib 4/21 in setting of CAF/ has pacemaker.   11/06/2019  - improved Rate with amio gtt and cardizem po scheduled and EP consult. 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 - Per primary / stroke team. - Na target per neuro  Hyperglycemia. - SSI / lantus.  Electrolyte imbalance  11/06/2019 - low K and low phos  Plan  - replete (d/w Pharmacy)   Anemia of critical illness  11/06/2019 0 no overt bleeding   Plan  - PRBC for  hgb </= 6.9gm%    - exceptions are   -  if ACS susepcted/confirmed then transfuse for hgb </= 8.0gm%,  or    -  active bleeding with hemodynamic instability, then transfuse regardless of hemoglobin value   At at all times try to transfuse 1 unit prbc as possible with exception of active hemorrhage   Thrombocytopenia  11/06/2019  - not on heparin  Plan  - monitir  - SCD  Best practice:  Diet: Tube feeds. Pain/Anxiety/Delirium protocol (if indicated):  Prn VAP protocol (if indicated): In place. DVT prophylaxis: SCD. GI prophylaxis: PPI. Glucose control: SSI / lantus. Mobility: BR. Code Status: full code. Family Communication: daughter   Disposition:  ICU.      ATTESTATION & SIGNATURE   The patient Krystal Little is critically ill with multiple organ systems failure and requires high complexity decision making for assessment and support, frequent evaluation and titration of therapies, application of advanced monitoring technologies and extensive interpretation of multiple databases.  Critical Care Time devoted to patient care services described in this note is  31  Minutes. This time reflects time of care of this signee Dr Brand Males. This critical care time does not reflect procedure time, or teaching time or supervisory time of PA/NP/Med student/Med Resident etc but could involve care discussion time     Dr. Brand Males, M.D., [facility].C.P Pulmonary and Critical Care Medicine Staff Physician [place] [facility] Pager: [phone], If no answer or between  15:00h - 7:00h: call [phone]  11/06/2019 9:52 AM      LABS    PULMONARY Recent Labs  Lab 10/31/19 1805 11/02/19 0848 11/02/19 1202  PHART  --  7.442 7.375  PCO2ART  --  34.1 39.1  PO2ART  --  339.0* 286.0*  HCO3  --  23.4 22.9  TCO2 29 24 24   O2SAT  --  100.0 100.0    CBC Recent Labs  Lab 11/04/19 0400 11/05/19 0324 11/06/19 0400  HGB 11.1*  10.1* 9.0*  HCT 34.1* 31.0* 27.7*  WBC 13.6* 10.7* 8.7  PLT 109* 87* 75*    COAGULATION Recent Labs  Lab 10/31/19 1744 11/02/19 0243 11/03/19 0408  INR 1.2 1.4* 1.3*    CARDIAC  No results for input(s): TROPONINI in the last 168 hours. No results for input(s): PROBNP in the last 168 hours.   CHEMISTRY Recent Labs  Lab 11/02/19 0243 11/02/19 0343 11/03/19 0408 11/03/19 1130 11/04/19 0400 11/04/19 0400 11/04/19 0938 11/04/19 1655 11/05/19 0324 11/05/19 1219 11/05/19 1833 11/06/19 0007 11/06/19 0400  NA 174*   < > 155*   < > 156*   < > 153*   < > 155* 154* 155* 154* 153*  K  --    < > 3.3*  --  3.2*   < > 4.5  --  3.9  --   --   --  3.3*  CL  --    < > 124*  --  125*  --  126*  --  127*  --   --   --  123*  CO2  --    < > 23  --  22  --  21*  --  24  --   --   --  24  GLUCOSE  --    < > 148*  --  249*  --  313*  --  320*  --   --   --  271*  BUN  --    < > 14  --  23  --  28*  --  28*  --   --   --  16  CREATININE  --    < > 0.70  --  0.85  --  0.83  --  0.95  --   --   --  0.65  CALCIUM  --    < > 8.6*  --  8.7*  --  8.3*  --  8.4*  --   --   --  7.9*  MG 1.9  --   --   --   --   --   --   --  2.2  --   --   --  2.2  PHOS  --   --   --   --   --   --   --   --   --   --   --   --  2.0*   < > =  values in this interval not displayed.   Estimated Creatinine Clearance: 66.2 mL/min (by C-G formula based on SCr of 0.65 mg/dL).   LIVER Recent Labs  Lab 10/31/19 1744 11/02/19 0243 11/03/19 0408  AST 25  --   --   ALT 20  --   --   ALKPHOS 92  --   --   BILITOT 1.1  --   --   PROT 7.4  --   --   ALBUMIN 4.1  --   --   INR 1.2 1.4* 1.3*     INFECTIOUS Recent Labs  Lab 11/06/19 0000 11/06/19 0400  LATICACIDVEN 1.7  --   PROCALCITON  --  0.88     ENDOCRINE CBG (last 3)  Recent Labs    11/05/19 2326 11/06/19 0324 11/06/19 0758  GLUCAP 256* 218* 191*         IMAGING x48h  - image(s) personally visualized  -   highlighted in bold CT HEAD WO  CONTRAST  Result Date: 11/06/2019 CLINICAL DATA:  Stroke. EXAM: CT HEAD WITHOUT CONTRAST TECHNIQUE: Contiguous axial images were obtained from the base of the skull through the vertex without intravenous contrast. COMPARISON:  CT head without contrast 11/04/2019, 11/02/2019, and 11/01/2019. FINDINGS: Brain: Large right MCA territory infarct is again seen. Hemorrhage the right lentiform nucleus is similar in size and configuration, measuring 4.2 x 2.2 cm on the axial images. Diffuse petechial hemorrhage is present throughout the infarct territory. Subarachnoid blood is noted within the right sylvian fissure. Right craniectomy is noted with expansion of the hemisphere through the craniotomy. Midline shift persists, measuring 11 mm, stable. Mild prominence of the left lateral ventricle is similar. No acute infarct or hemorrhage is present on the left. The brainstem and cerebellum are within normal limits. Vascular: Atherosclerotic calcifications are present within the cavernous internal carotid arteries bilaterally. Skull: Right craniectomy noted. Calvarium is otherwise unremarkable. Soft tissue changes are noted about the craniectomy. Brain is expanded into the craniectomy. Extracranial soft tissues are otherwise within normal limits. Sinuses/Orbits: The paranasal sinuses and mastoid air cells are clear. The globes and orbits are within normal limits. IMPRESSION: 1. Stable appearance of large right MCA territory infarct with diffuse petechial hemorrhage. 2. Stable mass effect and midline shift. 3. Stable mild prominence of the left lateral ventricle. Electronically Signed   By: San Morelle M.D.   On: 11/06/2019 05:48   DG CHEST PORT 1 VIEW  Result Date: 11/06/2019 CLINICAL DATA:  Intubation. EXAM: PORTABLE CHEST 1 VIEW COMPARISON:  Chest x-ray 11/04/2019. FINDINGS: Endotracheal tube, NG tube, left PICC line in stable position. Cardiac pacer in stable position. Prior median sternotomy. Heart size stable.  Bilateral pulmonary infiltrates/edema and bilateral pleural effusions. No pneumothorax. IMPRESSION: 1.  Lines and tubes in stable position. 2. Bilateral pulmonary infiltrates/edema and bilateral pleural effusions noted on today's exam. 3. Cardiac pacer stable position. Prior median sternotomy. Heart size stable. Electronically Signed   By: Marcello Moores  Register   On: 11/06/2019 05:45   DG Chest Port 1 View  Result Date: 11/04/2019 CLINICAL DATA:  Respiratory failure with hypoxia. EXAM: PORTABLE CHEST 1 VIEW COMPARISON:  11/03/2019 FINDINGS: ET tube tip is above the carina. There is a left arm PICC line with tip at the cavoatrial junction. Right chest wall pacer device is noted with leads in the right atrial appendage and right ventricle. Cardiac enlargement. New left lung base retrocardiac opacity is identified which obscures the left hemidiaphragm concerning for left lower lobe atelectasis and or consolidation.  IMPRESSION: 1. Complete opacification of the retrocardiac left lung base worrisome for left lower lobe atelectasis. 2. The left arm PICC line tip is at the cavoatrial junction. Electronically Signed   By: Kerby Moors M.D.   On: 11/04/2019 10:13

## 2019-11-06 NOTE — Progress Notes (Signed)
Orthopedic Tech Progress Note Patient Details:  Krystal Little 12/08/1955 [id number]  Ortho Devices Type of Ortho Device: Prafo boot/shoe Ortho Device/Splint Location: LLE Ortho Device/Splint Interventions: Ordered, Application   Post Interventions Patient Tolerated: Well Instructions Provided: Care of device   Braulio Bosch 11/06/2019, 12:32 PM

## 2019-11-06 NOTE — Progress Notes (Signed)
Physical Therapy Treatment Patient Details Name: Krystal Little MRN: [id number] DOB: [birth year] Today's Date: 11/06/2019    History of Present Illness 64 y.o. female presented to [facility] ED as a code stroke for  Right gaze deviation and left side weakness. Pt s/p R hemicraniectomy with bone flap placed in abdomen on 4/18.  PMH HTN, DM2, chronic a. Fib (coumadin) s/p pace maker.    PT Comments    Pt initially lethargic but becoming more alert with sitting edge of bed and with oral care. Pt becomes able to follow motor commands for RUE and RLE during session, but is unable with actively mobilize L side at this time. Pt requires totalA for all functional mobility. Pt demonstrates the ability to track to midline if PT assists with opening of eye, can not track left of midline at this time. Pt will continue to benefit from PT POC to improve activity tolerance, functional mobility, and balance. Pt with very supportive daughter present and providing encouragement during session.   Follow Up Recommendations  CIR;Supervision/Assistance - 24 hour(will need improved activity tolerance)     Equipment Recommendations  Hospital bed;Wheelchair (measurements PT);Wheelchair cushion (measurements PT);Other (comment)(mechanical lift if D/C today)    Recommendations for Other Services       Precautions / Restrictions Precautions Precautions: Fall Precaution Comments: previous bleeding from cariotomy site Restrictions Weight Bearing Restrictions: No    Mobility  Bed Mobility Overal bed mobility: Needs Assistance Bed Mobility: Supine to Sit;Sit to Supine   Sidelying to sit: Total assist;+2 for physical assistance Supine to sit: Total assist;+2 for physical assistance        Transfers Overall transfer level: Needs assistance Equipment used: 2 person hand held assist Transfers: Sit to/from Stand Sit to Stand: Total assist;+2 physical assistance            Ambulation/Gait                  Stairs             Wheelchair Mobility    Modified Rankin (Stroke Patients Only) Modified Rankin (Stroke Patients Only) Pre-Morbid Rankin Score: No symptoms Modified Rankin: Severe disability     Balance Overall balance assessment: Needs assistance Sitting-balance support: Feet supported;Single extremity supported Sitting balance-Leahy Scale: Zero Sitting balance - Comments: totalA Postural control: Posterior lean;Right lateral lean Standing balance support: Bilateral upper extremity supported Standing balance-Leahy Scale: Zero Standing balance comment: totalA x 2                            Cognition Arousal/Alertness: Lethargic(becomes more alert with stimulation) Behavior During Therapy: Flat affect Overall Cognitive Status: Difficult to assess                         Following Commands: Follows one step commands consistently(once alert after sitting up and performing oral care)              Exercises      General Comments General comments (skin integrity, edema, etc.): pt able to track to midline, not past midline to left, PT assisting with opening eyelid as pt unable to do so this session. Pt becomes more alert with sitting edge of bed and with oral care. Pt on VENT, 40% FiO2, 5 PEEP.      Pertinent Vitals/Pain Pain Assessment: Faces Pain Score: 0-No pain    Home Living  Prior Function            PT Goals (current goals can now be found in the care plan section) Acute Rehab PT Goals Patient Stated Goal: Get better; per daughter Progress towards PT goals: Progressing toward goals(very slowly)    Frequency    Min 3X/week      PT Plan Current plan remains appropriate    Co-evaluation PT/OT/SLP Co-Evaluation/Treatment: Yes Reason for Co-Treatment: Complexity of the patient's impairments (multi-system involvement);Necessary to address cognition/behavior during functional activity;For  patient/therapist safety;To address functional/ADL transfers PT goals addressed during session: Mobility/safety with mobility;Balance;Proper use of DME;Strengthening/ROM        AM-PAC PT "6 Clicks" Mobility   Outcome Measure  Help needed turning from your back to your side while in a flat bed without using bedrails?: Total Help needed moving from lying on your back to sitting on the side of a flat bed without using bedrails?: Total Help needed moving to and from a bed to a chair (including a wheelchair)?: Total Help needed standing up from a chair using your arms (e.g., wheelchair or bedside chair)?: Total Help needed to walk in hospital room?: Total Help needed climbing 3-5 steps with a railing? : Total 6 Click Score: 6    End of Session Equipment Utilized During Treatment: Oxygen Activity Tolerance: Patient tolerated treatment well Patient left: in bed;with call bell/phone within reach;with bed alarm set;with family/visitor present;with SCD's reapplied;with restraints reapplied Nurse Communication: Mobility status PT Visit Diagnosis: Difficulty in walking, not elsewhere classified (R26.2)     Time: 1007-1219 PT Time Calculation (min) (ACUTE ONLY): 29 min  Charges:  $Therapeutic Activity: 8-22 mins                     Zenaida Niece, PT, DPT Acute Rehabilitation Pager: [phone]    Zenaida Niece 11/06/2019, 11:37 AM

## 2019-11-07 ENCOUNTER — Inpatient Hospital Stay (HOSPITAL_COMMUNITY): Payer: Medicare Other

## 2019-11-07 DIAGNOSIS — I63411 Cerebral infarction due to embolism of right middle cerebral artery: Secondary | ICD-10-CM | POA: Diagnosis not present

## 2019-11-07 DIAGNOSIS — E876 Hypokalemia: Secondary | ICD-10-CM | POA: Diagnosis not present

## 2019-11-07 DIAGNOSIS — J9601 Acute respiratory failure with hypoxia: Secondary | ICD-10-CM | POA: Diagnosis not present

## 2019-11-07 DIAGNOSIS — J90 Pleural effusion, not elsewhere classified: Secondary | ICD-10-CM

## 2019-11-07 DIAGNOSIS — I482 Chronic atrial fibrillation, unspecified: Secondary | ICD-10-CM | POA: Diagnosis not present

## 2019-11-07 LAB — CBC
HCT: 32 % — ABNORMAL LOW (ref 36.0–46.0)
Hemoglobin: 10.4 g/dL — ABNORMAL LOW (ref 12.0–15.0)
MCH: 31 pg (ref 26.0–34.0)
MCHC: 32.5 g/dL (ref 30.0–36.0)
MCV: 95.5 fL (ref 80.0–100.0)
Platelets: 110 10*3/uL — ABNORMAL LOW (ref 150–400)
RBC: 3.35 MIL/uL — ABNORMAL LOW (ref 3.87–5.11)
RDW: 14.8 % (ref 11.5–15.5)
WBC: 14 10*3/uL — ABNORMAL HIGH (ref 4.0–10.5)
nRBC: 0.1 % (ref 0.0–0.2)

## 2019-11-07 LAB — BASIC METABOLIC PANEL
Anion gap: 10 (ref 5–15)
BUN: 14 mg/dL (ref 8–23)
CO2: 31 mmol/L (ref 22–32)
Calcium: 8.4 mg/dL — ABNORMAL LOW (ref 8.9–10.3)
Chloride: 109 mmol/L (ref 98–111)
Creatinine, Ser: 0.53 mg/dL (ref 0.44–1.00)
GFR calc Af Amer: >60 mL/min (ref >60)
GFR calc non Af Amer: >60 mL/min (ref >60)
Glucose, Bld: 166 mg/dL — ABNORMAL HIGH (ref 70–99)
Potassium: 3.2 mmol/L — ABNORMAL LOW (ref 3.5–5.1)
Sodium: 150 mmol/L — ABNORMAL HIGH (ref 135–145)

## 2019-11-07 LAB — PHOSPHORUS: Phosphorus: 2.7 mg/dL (ref 2.5–4.6)

## 2019-11-07 LAB — MAGNESIUM: Magnesium: 2.4 mg/dL (ref 1.7–2.4)

## 2019-11-07 LAB — SODIUM
Sodium: 150 mmol/L — ABNORMAL HIGH (ref 135–145)
Sodium: 148 mmol/L — ABNORMAL HIGH (ref 135–145)

## 2019-11-07 LAB — GLUCOSE, CAPILLARY
Glucose-Capillary: 137 mg/dL — ABNORMAL HIGH (ref 70–99)
Glucose-Capillary: 152 mg/dL — ABNORMAL HIGH (ref 70–99)
Glucose-Capillary: 170 mg/dL — ABNORMAL HIGH (ref 70–99)
Glucose-Capillary: 175 mg/dL — ABNORMAL HIGH (ref 70–99)
Glucose-Capillary: 224 mg/dL — ABNORMAL HIGH (ref 70–99)
Glucose-Capillary: 236 mg/dL — ABNORMAL HIGH (ref 70–99)

## 2019-11-07 LAB — LACTIC ACID, PLASMA: Lactic Acid, Venous: 1.4 mmol/L (ref 0.5–1.9)

## 2019-11-07 LAB — PROCALCITONIN: Procalcitonin: 0.63 ng/mL

## 2019-11-07 MED ORDER — INSULIN GLARGINE 100 UNIT/ML ~~LOC~~ SOLN
25.0000 [IU] | Freq: Two times a day (BID) | SUBCUTANEOUS | Status: DC
  Administered 2019-11-07 – 2019-11-15 (×16): 25 [IU] via SUBCUTANEOUS
  Filled 2019-11-07 (×19): qty 0.25

## 2019-11-07 MED ORDER — POTASSIUM CHLORIDE 20 MEQ/15ML (10%) PO SOLN
40.0000 meq | ORAL | Status: DC
  Administered 2019-11-07: 40 meq via ORAL
  Filled 2019-11-07 (×2): qty 30

## 2019-11-07 MED ORDER — POTASSIUM CHLORIDE 20 MEQ/15ML (10%) PO SOLN
40.0000 meq | ORAL | Status: AC
  Administered 2019-11-07: 40 meq

## 2019-11-07 MED ORDER — METOPROLOL TARTRATE 50 MG PO TABS
50.0000 mg | ORAL_TABLET | Freq: Two times a day (BID) | ORAL | Status: DC
  Administered 2019-11-08 – 2019-11-14 (×11): 50 mg
  Filled 2019-11-07 (×14): qty 1

## 2019-11-07 MED ORDER — METOPROLOL TARTRATE 25 MG PO TABS
25.0000 mg | ORAL_TABLET | Freq: Once | ORAL | Status: AC
  Administered 2019-11-07: 25 mg

## 2019-11-07 NOTE — Progress Notes (Signed)
STROKE TEAM PROGRESS NOTE   INTERVAL HISTORY [name]. Pt stable neurologically, still eyes closed vs. Opening apraxia. Still follows command on the right. On po amiodarone, still has afib RVR, BP improving, increase metoprolol dose. Continued hyperglycemia, will increase lantus dose.   OBJECTIVE Vitals:   11/07/19 0800 11/07/19 0822 11/07/19 0900 11/07/19 1000  BP: (!) 162/79  103/62 133/73  Pulse: (!) 113 (!) 105 91 92  Resp: (!) 25 20 (!) 22 (!) 23  Temp: 98.7 F (37.1 C)     TempSrc: Axillary     SpO2: 99% 100% 98% 99%  Weight:      Height:        CBC:  Recent Labs  Lab 10/31/19 1744 10/31/19 1805 11/06/19 0400 11/07/19 0605  WBC 5.7   < > 8.7 14.0*  NEUTROABS 4.1  --   --   --   HGB 14.3   < > 9.0* 10.4*  HCT 43.1   < > 27.7* 32.0*  MCV 91.3   < > 97.5 95.5  PLT 151   < > 75* 110*   < > = values in this interval not displayed.    Basic Metabolic Panel:  Recent Labs  Lab 11/06/19 0400 11/06/19 1210 11/07/19 0122 11/07/19 0605  NA 153*   < > 150* 150*  K 3.3*  --   --  3.2*  CL 123*  --   --  109  CO2 24  --   --  31  GLUCOSE 271*  --   --  166*  BUN 16  --   --  14  CREATININE 0.65  --   --  0.53  CALCIUM 7.9*  --   --  8.4*  MG 2.2  --   --  2.4  PHOS 2.0*  --   --  2.7   < > = values in this interval not displayed.    Lipid Panel:     Component Value Date/Time   CHOL 239 (H) 11/01/2019 0348   CHOL 212 (H) 07/15/2018 0907   TRIG 102 11/04/2019 0639   HDL 69 11/01/2019 0348   HDL 50 07/15/2018 0907   CHOLHDL 3.5 11/01/2019 0348   VLDL 12 11/01/2019 0348   LDLCALC 158 (H) 11/01/2019 0348   LDLCALC 138 (H) 07/15/2018 0907   HgbA1c:  Lab Results  Component Value Date   HGBA1C 9.7 (H) 11/01/2019   Urine Drug Screen:     Component Value Date/Time   LABOPIA NONE DETECTED 10/31/2019 1820   COCAINSCRNUR NONE DETECTED 10/31/2019 1820   LABBENZ NONE DETECTED 10/31/2019 1820   AMPHETMU NONE DETECTED 10/31/2019 1820   THCU NONE  DETECTED 10/31/2019 1820   LABBARB NONE DETECTED 10/31/2019 1820    Alcohol Level     Component Value Date/Time   ETH <10 10/31/2019 1744    IMAGING CT HEAD CODE STROKE WO CONTRAST 10/31/2019 IMPRESSION:  1. Large right MCA territory nonhemorrhagic infarct is described.  2. Hyperdense right MCA suggesting extensive thrombus from the right ICA terminus through the MCA bifurcation  3. Some mass effect with partial effacement the sulci on the right. No midline shift.  4. ASPECTS is 4/10   CT Code Stroke CTA Head W/WO contrast CT Code Stroke CTA Neck W/WO contrast CT Code Stroke Cerebral Perfusion with contrast 10/31/2019 IMPRESSION:  1. Confirmed large right MCA territory infarct with core volume of 107 mL.  2. Large mismatch volume of 9.6 with a mismatch ratio of 1.9.  3. T occlusion of right ICA terminus.  4. Thrombus extends through the right MCA bifurcation with poor collaterals.  5. Minimal atherosclerotic changes at the carotid bifurcations bilaterally without significant stenosis.  6. No significant intracranial disease on the left or in the posterior circulation.  7. Fetal type right posterior cerebral artery originates just below the terminal ICA thrombus.   Interventional Neuro Radiology - Cerebral Angiogram with Intervention - de Rosario Jacks, MD 10/31/2019 8:20 PM ICA terminus occlusion. Mechanical thrombectomy performed with 2 stent retriever + aspiration passes in the MCA-ICA (solitaire and embotrap), 1 stent retriever + aspiration pass MCA (embotrap) and 1 direct contact aspiration in the right ACA. Complete recanalization achieved (TICI3).  CT HEAD WO CONTRAST 11/01/2019 1. Large area of hypoattenuation within the right MCA territory, consistent with subacute infarct.  2. Multifocal hyperdensity within the right MCA territory is consistent with hemorrhage or contrast staining. The more peripheral hyperdensities are favored to be hemorrhagic.  3. 6 mm  leftward midline shift at the level of the foramina of Monro.  11/02/2019 3:30 AM 1. Worsened edema within the right MCA territory with increased size of right basal ganglia intraparenchymal hematoma. 2. Increased amount of subarachnoid blood over the right hemisphere. 3. 11 mm of leftward midline shift at the level of the foramen of [place]ro with early herniation of the right cingulate gyrus beneath the falx cerebri 11/04/2019  1. Postoperative sequela from interval decompressive right hemicraniectomy. 2. Continued interval evolution of a large right MCA vascular territory subacute infarct with continued interval increase in edema and swelling. Petechial hemorrhage throughout the infarct territory has increased. Small volume subarachnoid hemorrhage overlying the right cerebral hemisphere has also slightly increased. Parenchymal hemorrhage centered within the right basal ganglia is similar to slightly increased as compared to 11/02/2019. 3. Similar degree of mass effect with partial effacement of the ventricular system and 11 mm leftward midline shift. As before, there is early leftward subfalcine herniation and persistent partial effacement of the right basal cisterns. 11/06/2019  1. Stable appearance of large right MCA territory infarct with diffuse petechial hemorrhage. 2. Stable mass effect and midline shift. 3. Stable mild prominence of the left lateral ventricle.   DG Chest Port 1 View 11/06/2019 1.  Lines and tubes in stable position. 2. Bilateral pulmonary infiltrates/edema and bilateral pleural effusions noted on today's exam. 3. Cardiac pacer stable position. Prior median sternotomy. Heart size stable. 11/04/2019 1. Complete opacification of the retrocardiac left lung base worrisome for left lower lobe atelectasis. 2. The left arm PICC line tip is at the cavoatrial junction.  11/03/2019 Stable support apparatus. No acute cardiopulmonary abnormality seen.  11/02/2019 1. Well-positioned support  structures. No pneumothorax. 2. Stable mild cardiomegaly without overt pulmonary edema. No active pulmonary disease.  11/01/2019 1. Well-positioned left PICC with tip overlying the cavoatrial junction. 2. Borderline mild cardiomegaly. No pulmonary edema. No active pulmonary disease.  11/01/2019 1. Malpositioned left PICC with tip oriented superiorly in the high right mediastinum, recommend repositioning. 2. No active cardiopulmonary disease.  10/31/2019 Mild cardiomegaly. No acute chest findings.    CUP PACEART INCLINIC DEVICE CHECK  10/30/2019 Pacemaker check in clinic. Normal device function. Thresholds, sensing, impedances consistent with previous measurements. Device programmed to maximize longevity. No high ventricular rates noted. Device programmed at appropriate safety margins. Histogram  distribution appropriate for patient activity level. Device programmed to optimize intrinsic conduction. Estimated longevity4 years. Patient enrolled in remote follow-up. Patient education completed. ROV w/ WC.Lavenia Atlas, BSN, RN  ECHOCARDIOGRAM COMPLETE  11/02/2019 1. Left ventricular ejection fraction, by estimation, is 50 to 55%. The left ventricle has low normal function. There is moderate left ventricular hypertrophy. Left ventricular diastolic parameters are indeterminate.  2. Right ventricular systolic function is moderately reduced. The right ventricular size is normal.  3. Left atrial size was moderately dilated.  4. The mitral valve has been repaired. There is a prosthetic annuloplasty ring present in the mitral position. No evidence of mitral valve regurgitation. The mean mitral valve gradient is 4.0 mmHg.  5. Tricuspid valve regurgitation is moderate.  6. The aortic valve was not well visualized. Aortic valve regurgitation is not visualized. No aortic stenosis is present.    PHYSICAL EXAM   Temp:  [97.8 F (36.6 C)-101.2 F (38.4 C)] 98.7 F (37.1 C) (04/23 0800) Pulse Rate:  [87-119] 92  (04/23 1000) Resp:  [20-30] 23 (04/23 1000) BP: (99-169)/(48-102) 133/73 (04/23 1000) SpO2:  [98 %-100 %] 99 % (04/23 1000) FiO2 (%):  [40 %] 40 % (04/23 0822) Weight:  [67 kg] 67 kg (04/23 0500)  General - Well nourished, well developed, intubated off sedation.  Ophthalmologic - fundi not visualized due to noncooperation.  Cardiovascular - irregularly irregular heart rate and rhythm.  Neuro - intubated off fentanyl, eyes closed, not open with stimulation, possible eye opening apraxia. Right eyelid swollen nearly resolved. With eye forced opening, eyes in right gaze position, not blinking to visual threat, doll's eyes sluggish, bilateral pupil 18mm, sluggish to light. Corneal reflex weak bilateral, gag and cough present. Breathing over the vent.  Facial symmetry not able to test due to ET tube.  Tongue protrusion not cooperative. However, she was able to follow commands on the right hand and foot. On pain stimulation, RUE and RLE mild withdraw 2/5, LUE extension posturing and LLE slight withdraw. DTR 1+ and bilateral babinski. Sensation, coordination and gait not tested.   ASSESSMENT/PLAN Krystal Little is a 64 y.o. female with history of HTN, DM2, hx of mitral valve repair, chronic a. Fib (coumadin - INR 1.2 on admission) s/p pacemaker who presented to [facility] ED as a code stroke for right gaze deviation, urinary incontinence and left sided weakness. She did not receive IV t-PA due to anticoagulation with coumadin. IR -> ICA terminus occlusion. Mechanical thrombectomy performed with 2 stent retriever + aspiration passes in the MCA-ICA (solitaire and embotrap), 1 stent retriever + aspiration pass MCA (embotrap) and 1 direct contact aspiration in the right ACA. Complete recanalization achieved (TICI3).  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 Hyperdense right MCA suggesting extensive thrombus from the right ICA terminus through the MCA bifurcation Some mass effect with partial effacement the sulci on the right. No midline shift. ASPECTS is 4/10   CTA H&N - Total occlusion of right ICA terminus. Thrombus extends through the right MCA bifurcation with poor collaterals.   CT Perfusion - Confirmed large right MCA territory infarct with core volume of 107 mL. Large mismatch volume of 9.6 with a mismatch ratio of 1.9.   CT head -  Large area of hypoattenuation within the right MCA territory, consistent with subacute infarct. Multifocal hyperdensity within the right MCA territory is consistent with hemorrhage or contrast staining. The more peripheral hyperdensities are favored to be hemorrhagic. 6 mm leftward midline shift  CT Head  11/02/19 - Worsened edema within the right MCA territory with increased size of right basal ganglia intraparenchymal hematoma. Increased amount of subarachnoid blood over the right hemisphere. 11 mm of leftward midline shift  MRI head - not performed - PPM   CT head 4/20 postop changed from R crani. Evolution large R MCA infarct w/ interval increase edema/swelling. Increased petechial hemorrhage throughout. SAH also increased. R BG HT slightly increased. Mass effect, partial effacement ventricles w/ 61mm L midline shift   CT 4/22 stable large R MCA infarct w/ diffuse petechial hemorrhage   2D Echo - EF 50-55%. No source of embolus. 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   Sars Corona Virus 2 - negative  LDL - 158  HgbA1c - 9.7  UDS - negative  VTE prophylaxis - SCDs  warfarin daily prior to admission, now on No antithrombotic  Therapy recommendations:  CIR - admission coordinator following  Disposition:  Pending  Acute hypoxemia respiratory failure d/t stroke  Intubated for emergent hemicraniectomy for cerebral edema  CCM on  board  On fentanyl->prn sedation  CXR 4/20 - Complete opacification of the retrocardiac left lung base worrisome for left lower lobe atelectasis.  CXR 4/22 B pulm infiltrates / edema and B pleural effusions  CXR 4/23 pending  Lasix 40 and d/c IVF  Cerebral edema w/ subfalcine herniation s/p decompressive hemicraniectomy  CT 4/22 stable large R MCA infarct w/ diffuse petechial hemorrhage  3% saline @ 50 cc/hr -> NS @ 50 -> off  Na 138->...-151-150-150->148  NSG on board s/p [facility] 4/18  PICC line placed  23.4% x 2  Now 7 days out, will allow Na gradually trending down.  Atrial fibrillation w/ RVR  Telemetry showed A. fib, rate controlled  On Coumadin PTA, family stated compliance  Subtherapeutic INR 1.2 on presentation -> 1.4  No AC at the time due to large infarct and hemorrhagic conversion (previously refused DOACs)  metoprolol 25 bid->50 bid -> d/c -> 50 bid  On cardizem 60 q 8h->60 q 6h  amiodarone gtt converted to po 4/22 - 400 bid x 1 week then 400 daily  EP Cardiology on board  Hypertension->Hypotension  Home BP meds: Atenolol; Cardizem ; lisinopril  Current BP meds: cardizem, metoprolol 50 bid  Off cleviprex  lisinopril d/c due to low BP  BP improved  Resume metoprolol 25 bid -> 50 bid . SBP goal < 160 mm Hg . Long-term BP goal normotensive  Hyperlipidemia  Home Lipid lowering medication: zetia  LDL 158, goal < 70  Hold off statin for know due to left hemorrhagic conversion  Consider statin at discharge  Diabetes, uncontrolled  Home diabetic meds: metformin  HgbA1c 9.7, goal < 7.0  CBG monitoring  Hyperglycemia - on lantus 10->20->15 bid->25 bid, novolog 4u->7u q4h  Continue SSI 0-20  DM coordinator consulted  Anemia and thrombocytopenia, improving  Platelet 168->...->75->110  Hb 11.5->...9.0->10.4  Close monitoring  Dysphagia  N.p.o. for now  OG placed  On TF @ 34  Speech following  Other Stroke Risk  Factors  Advanced age  ETOH use, advised to drink no more than 1 alcoholic beverage per day.  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  fib, dysphagia and at significant risk of neurological worsening, death form brain herniation, cerebral edema, recurrent stroke, hematoma expansion, heart failure, aspiration pneumonia, shock. This patient's care requires constant monitoring of vital signs, hemodynamics, respiratory and cardiac monitoring, review of multiple databases, neurological assessment, discussion with family, other specialists and medical decision making of high complexity. I spent 35 minutes of neurocritical care time in the care of this patient. I had long discussion with daughter at bedside, updated pt current condition, treatment plan and potential prognosis, and answered all the questions.  She expressed understanding and appreciation.   Rosalin Hawking, MD PhD Stroke Neurology 11/07/2019 10:39 AM   To contact Stroke Continuity provider, please refer to [email or web address] After hours, contact General Neurology

## 2019-11-07 NOTE — Progress Notes (Signed)
Physical Therapy Treatment Patient Details Name: Krystal Little MRN: [id number] DOB: 1955-11-30 Today's Date: 11/07/2019    History of Present Illness 64 y.o. female presented to [facility] ED as a code stroke for  Right gaze deviation and left side weakness. Pt s/p R hemicraniectomy with bone flap placed in abdomen on 4/18.  PMH HTN, DM2, chronic a. Fib (coumadin) s/p pace maker.    PT Comments    Pt tolerated treatment well, becoming more alert and participatory with exercise with less stimulation. Pt able to perform exercise and follow commands with R side without change in position needed to arouse. Pt follows multi-step commands, performing exercises for 5 repetitions and counting reps on her own, stopping exercise after 5th rep. Pt does continues to track to midline when attempting to look at spouse during session. PT educated spouse on PROM HEP to maintain ROM. Pt does continue to require significant assistance to maintain balance and mobilize, falling over to left in bed in chair position despite back support. Pt will continue to benefit from aggressive mobilization and continued HEP to improve arousal and improve strength and motor control.   Follow Up Recommendations  CIR;Supervision/Assistance - 24 hour     Equipment Recommendations  Hospital bed;Wheelchair (measurements PT);Wheelchair cushion (measurements PT);Other (comment)(mechanical lift, if D/C home today)    Recommendations for Other Services       Precautions / Restrictions Precautions Precautions: Fall Restrictions Weight Bearing Restrictions: No    Mobility  Bed Mobility               General bed mobility comments: PT utilizing bed egress position to move into chair position for more upright posture. Pt requiring totalA to reposition in bed  Transfers                    Ambulation/Gait                 Stairs             Wheelchair Mobility    Modified Rankin (Stroke Patients  Only) Modified Rankin (Stroke Patients Only) Pre-Morbid Rankin Score: No symptoms Modified Rankin: Severe disability     Balance Overall balance assessment: Needs assistance Sitting-balance support: Feet supported;No upper extremity supported Sitting balance-Leahy Scale: Zero Sitting balance - Comments: pt sitting in bed in chair position, tending to fall to left side intermittently despite back support Postural control: Left lateral lean                                  Cognition Arousal/Alertness: Lethargic(pt responds to verbal cues with initial tactile stimulation) Behavior During Therapy: Flat affect Overall Cognitive Status: Difficult to assess Area of Impairment: Following commands                       Following Commands: Follows one step commands consistently;Follows multi-step commands inconsistently(pt does follow to perform 5 reps of exercise)       General Comments: pt more alert today without change in body position to arouse and follow commands      Exercises General Exercises - Upper Extremity Shoulder Flexion: Right;AAROM;5 reps;PROM;Left Elbow Flexion: AAROM;Right;5 reps;PROM;Left Elbow Extension: (flicker of L tricep) Wrist Flexion: AROM;Right;5 reps(gravity eliminated position) Digit Composite Flexion: AROM;Right;10 reps General Exercises - Lower Extremity Ankle Circles/Pumps: AROM;Right;10 reps;Left;PROM Short Arc Quad: AROM;Right;10 reps;Left;PROM Heel Slides: PROM;Left;10 reps Hip ABduction/ADduction: AAROM;Right;5 reps Other Exercises Other  Exercises: cervical rotation to left to look at spouse, rotates back to midline from left initially    General Comments General comments (skin integrity, edema, etc.): pt on VENT, full support, 5 PEEP, 40% FiO2      Pertinent Vitals/Pain Pain Assessment: Faces Faces Pain Scale: No hurt    Home Living                      Prior Function            PT Goals (current  goals can now be found in the care plan section) Acute Rehab PT Goals Patient Stated Goal: Get better; per daughter Progress towards PT goals: Progressing toward goals(very slowly)    Frequency    Min 3X/week      PT Plan Current plan remains appropriate    Co-evaluation              AM-PAC PT "6 Clicks" Mobility   Outcome Measure  Help needed turning from your back to your side while in a flat bed without using bedrails?: Total Help needed moving from lying on your back to sitting on the side of a flat bed without using bedrails?: Total Help needed moving to and from a bed to a chair (including a wheelchair)?: Total Help needed standing up from a chair using your arms (e.g., wheelchair or bedside chair)?: Total Help needed to walk in hospital room?: Total Help needed climbing 3-5 steps with a railing? : Total 6 Click Score: 6    End of Session Equipment Utilized During Treatment: Oxygen Activity Tolerance: Patient tolerated treatment well Patient left: in bed;with call bell/phone within reach;with bed alarm set;with family/visitor present Nurse Communication: Mobility status PT Visit Diagnosis: Difficulty in walking, not elsewhere classified (R26.2)     Time: 1165-7903 PT Time Calculation (min) (ACUTE ONLY): 35 min  Charges:  $Therapeutic Exercise: 23-37 mins                     Zenaida Niece, PT, DPT Acute Rehabilitation Pager: [phone]    Zenaida Niece 11/07/2019, 4:12 PM

## 2019-11-07 NOTE — Progress Notes (Signed)
NAME:  Krystal Little, MRN:  [id number], DOB:  [birth year], LOS: 7 ADMISSION DATE:  10/31/2019, CONSULTATION DATE:  4/18 REFERRING MD:  Neurology service CHIEF COMPLAINT:  Vent dep post craniotomy    Brief History   75 yobf with hbp, dm, caf on coumadin with pacemaker found down/incontinent  p 2 h since last observed on pm 4/16> to [facility] ED as code stroke with R MCA > IR endovascular thrombectomy with revascularization with large area of edema post pocedure and progressive decrease LOC  > to OR am 4/18 for decompressive hemicraniectomy and return on vent so PCCM service asked to consult.  Past Medical History  HBP CAF on coumadin DM    has a past medical history of Chronic atrial fibrillation ([place]) (1990s), Diabetes mellitus type 2, uncontrolled, without complications, Essential hypertension, History of cardiac catheterization (2001), sick sinus syndrome (07/1999), S/P MVR (mitral valve repair) (08/09/1999), and 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   has a past surgical history that includes Mitral valve repair (07/1999); transthoracic echocardiogram (05/2018); Cardiac catheterization (08/03/1999); Pacemaker insertion (07/1999); Pacemaker generator change (11/26/2008); IR PERCUTANEOUS ART THROMBECTOMY/INFUSION INTRACRANIAL INC DIAG ANGIO (10/31/2019); Radiology with anesthesia (N/A, 10/31/2019); IR PERCUTANEOUS ART THROMBECTOMY/INFUSION INTRACRANIAL INC DIAG ANGIO (10/31/2019); [place] (10/31/2019); and Craniectomy for depressed skull fracture (Right, 11/02/2019).   Significant Hospital Events   4/16 IR thrombectomy   4/18 for decompressive hemicraniectomy  4/20 - Cleviprex off overnight along with fentanyl.  Attempted PSV wean this AM but developed tachypnea and tachycardia so started back on cleviprex and fentanyl and switched back to full support for now.  4.21  - on vent. On fent gtt. Off cleviprex x few days per rN. Overngiht A fib RVR and started on amio gtt. HR still 127 with  A fib  RVR but also hypotensive/soft spb 80s-120s. On scheduled cardizem po and prn lopressor. Na 155 - but off 3% saline. MAde only 400cc urine overnight. Husband Evva Din at beside - reports hx of rheumatic heart disease. Per RN - moves right side purposefully. Per husband moved LLE spontaneously x 1. On low dose fent  Gtt on vent. Doing SBT  4/22 = seen by Dr Curt Bears yesterdya. Continue amio. Increased cardizem. Aim for diuresis when possible and consider NOAC for long term anticoagulation when stable. Off sedtaion. PT/OT workign with patient - > making her sit on side of bed but not natural tone  Consults:  NS[facility] Neurology PCCM  EP Dr Curt Bears 4/21  Procedures:  PIC LUE 4/17 > Oral et 4/18 > L RA art line 4/18 >  Significant Diagnostic Tests:  CT head 4/18 > Worsened edema within the right MCA territory with increased size. of right basal ganglia intraparenchymal hematoma. Increased amount of subarachnoid blood over the right hemisphere. 11 mm of leftward midline shift at the level of the foramen of Monro with early herniation of the right cingulate gyrus beneath the falx cerebri CT head 4/20 > continued interval evolution of large right MCA subacute infract with continued interval increase in edema and swelling.  Petechial hemorrhage throughout the infarct has increased.  Similar parenchymal hemorrhage centered within the right basal ganglia.  CT 4/22 -  1. Stable appearance of large right MCA territory infarct with diffuse petechial hemorrhage. 2. Stable mass effect and midline shift. 3. Stable mild prominence of the left lateral ventricle.    Micro Data:  RVP 4/16 neg  MRSA PCR  4/17 neg  xxx Blood 4/23 ua 4/23 Trach aspirate 4/23  Antimicrobials:   bactriacin 4/18 - 4/18   Interim history/subjective:   11/07/2019 -  Off amio gtt./on po amio with cardizem. HR 80s A Fib. On PSV  Via vent.. Off sedatoom gtt. Seems a bit more responsive.  Not fully opening eyes. Per RN -  can occ follow commands like wiggle toes. Waxes and wanes.  Husband at bedside - feels she is making good progress. Feels talk about trach might be premature. CT hjead yesterdya - with stable mass effect  Fever 101.2 last night with wbc 15K and PCT 0.88     Objective   Blood pressure (!) 120/55, pulse 84, temperature 98.7 F (37.1 C), temperature source Axillary, resp. rate 19, height 5\' 4"  (1.626 m), weight 67 kg, SpO2 99 %.    Vent Mode: CPAP;PSV FiO2 (%):  [40 %] 40 % Set Rate:  [15 bmp] 15 bmp Vt Set:  [450 mL] 450 mL PEEP:  [5 cmH20] 5 cmH20 Pressure Support:  [10 cmH20] 10 cmH20 Plateau Pressure:  [15 cmH20-17 cmH20] 17 cmH20   Intake/Output Summary (Last 24 hours) at 11/07/2019 1115 Last data filed at 11/07/2019 1000 Gross per 24 hour  Intake 2616.41 ml  Output 3326 ml  Net -709.59 ml   Filed Weights   10/31/19 1907 11/06/19 0500 11/07/19 0500  Weight: 63.6 kg 67.4 kg 67 kg     General Appearance:  Looks criticall ill Head:  RT scalp shaved Eyes:  PERRL - x, conjunctiva/corneas - muddy     Ears:  Normal external ear canals, both ears Nose:  G tube - no Throat:  ETT TUBE - yes , OG tube - yes, MODERATE GAG + per RN Neck:  Supple,  No enlargement/tenderness/nodules Lungs: Clear to auscultation bilaterally, Ventilator   Synchrony - doing PSV Heart:  S1 and S2 normal, no murmur, CVP - no.  Pressors - no Abdomen:  Soft, no masses, no organomegaly Genitalia / Rectal:  Not done Extremities:  Extremities- intact Skin:  ntact in exposed areas . Sacral area - not examined Neurologic:  Sedation - none -> RASS - -3 (RN has observed movement in right side strpong but mild flicker left side)    Assessment & Plan:   Acute hypoxemic respiratory failure in setting of emergent hemicraniectomy for vasogenic cerebral edema s/p R MCA/ thrombectomy.    11/07/2019 - > does not meet criteria for Extubation in setting of Acute Respiratory Failure due to stroke/encephalopathy    Plan PSV as tolerated PRVC to reset No extubation No discussion on trach for another several days to a week given improvement and per family goals   Known permanent A Fib Rapid afib 4/21 in setting of CAF/ has pacemaker. Seen by Dr Curt Bears 11/05/19  11/07/2019  - improved Rate HR 81. 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 - Per primary / stroke team.  11/07/2019 - Nas 150 (also on lasix)  Plan - Na target per neuro; 150-160  Hyperglycemia. - SSI / lantus.  Electrolyte imbalance  11/07/2019 - low K    Plan  - repleted    Anemia of critical illness  11/07/2019 - no overt bleeding   Plan  -  PRBC for hgb </= 6.9gm%    - exceptions are   -  if ACS susepcted/confirmed then transfuse for hgb </= 8.0gm%,  or    -  active bleeding with hemodynamic instability, then transfuse regardless of hemoglobin value   At at all times try to transfuse 1 unit prbc as possible with exception of active hemorrhage   Thrombocytopenia  11/07/2019  - not on heparin. improved  Plan  - monitir  - SCD   Concern for sepsis  11/07/2019 - SIRS 4/22 with PCT 0.88  Plan  - track sirs markets without antibiotics  - blood culture 4/22  - ua 4/22 - trach aspirate 4/22  Best practice:  Diet: Tube feeds. Pain/Anxiety/Delirium protocol (if indicated):  Prn VAP protocol (if indicated): In place. DVT prophylaxis: SCD. -> check with neuro 4/24 about timing of heparin start given improving platelet count GI prophylaxis: PPI. Glucose control: SSI / lantus. Mobility: BR. Code Status: full code. Family Communication: husband at bedside. Feels patient is making good prgress and trach conversation premature as  of 11/07/19  Disposition:  ICU.       ATTESTATION & SIGNATURE   The patient Krystal Little is critically ill with multiple organ systems failure and requires high complexity decision making for assessment and support, frequent evaluation and titration of therapies, application of advanced monitoring technologies and extensive interpretation of multiple databases.   Critical Care Time devoted to patient care services described in this note is  31  Minutes. This time reflects time of care of this signee Dr Brand Males. This critical care time does not reflect procedure time, or teaching time or supervisory time of PA/NP/Med student/Med Resident etc but could involve care discussion time     Dr. Brand Males, M.D., [facility].C.P Pulmonary and Critical Care Medicine Staff Physician [facility] Pager: [phone], If no answer or between  15:00h - 7:00h: call [phone]  11/07/2019 12:12 PM      LABS    PULMONARY Recent Labs  Lab 10/31/19 1805 11/02/19 0848 11/02/19 1202  PHART  --  7.442 7.375  PCO2ART  --  34.1 39.1  PO2ART  --  339.0* 286.0*  HCO3  --  23.4 22.9  TCO2 29 24 24   O2SAT  --  100.0 100.0    CBC Recent Labs  Lab 11/05/19 0324 11/06/19 0400 11/07/19 0605  HGB 10.1* 9.0* 10.4*  HCT 31.0* 27.7* 32.0*  WBC 10.7* 8.7 14.0*  PLT 87* 75* 110*    COAGULATION Recent Labs  Lab 10/31/19 1744 11/02/19 0243 11/03/19 0408  INR 1.2 1.4* 1.3*    CARDIAC  No results for input(s): TROPONINI in the last 168 hours. No results for input(s): PROBNP in the last 168 hours.   CHEMISTRY Recent Labs  Lab 11/02/19 0243 11/02/19 0343 11/04/19 0400 11/04/19 0400 11/04/19 0938 11/04/19 1655 11/05/19 0324 11/05/19 1219 11/06/19 0400 11/06/19 1210 11/06/19 1800 11/07/19 0122 11/07/19 0605  NA 174*   < > 156*   < > 153*   < > 155*   < > 153* 152* 151* 150* 150*  K  --    < > 3.2*   < > 4.5  --  3.9   --  3.3*  --   --   --  3.2*  CL  --    < > 125*  --  126*  --  127*  --  123*  --   --   --  109  CO2  --    < > 22  --  21*  --  24  --  24  --   --   --  31  GLUCOSE  --    < > 249*  --  313*  --  320*  --  271*  --   --   --  166*  BUN  --    < > 23  --  28*  --  28*  --  16  --   --   --  14  CREATININE  --    < > 0.85  --  0.83  --  0.95  --  0.65  --   --   --  0.53  CALCIUM  --    < > 8.7*  --  8.3*  --  8.4*  --  7.9*  --   --   --  8.4*  MG 1.9  --   --   --   --   --  2.2  --  2.2  --   --   --  2.4  PHOS  --   --   --   --   --   --   --   --  2.0*  --   --   --  2.7   < > = values in this interval not displayed.   Estimated Creatinine Clearance: 66 mL/min (by C-G formula based on SCr of 0.53 mg/dL).   LIVER Recent Labs  Lab 10/31/19 1744 11/02/19 0243 11/03/19 0408  AST 25  --   --   ALT 20  --   --   ALKPHOS 92  --   --   BILITOT 1.1  --   --   PROT 7.4  --   --   ALBUMIN 4.1  --   --   INR 1.2 1.4* 1.3*     INFECTIOUS Recent Labs  Lab 11/06/19 0000 11/06/19 0400 11/07/19 0122 11/07/19 0605  LATICACIDVEN 1.7  --  1.4  --   PROCALCITON  --  0.88  --  0.63     ENDOCRINE CBG (last 3)  Recent Labs    11/06/19 2307 11/07/19 0323 11/07/19 0758  GLUCAP 244* 236* 152*         IMAGING x48h  - image(s) personally visualized  -   highlighted in bold CT HEAD WO CONTRAST  Result Date: 11/06/2019 CLINICAL DATA:  Stroke. EXAM: CT HEAD WITHOUT CONTRAST TECHNIQUE: Contiguous axial images were obtained from the base of the skull through the vertex without intravenous contrast. COMPARISON:  CT head without contrast 11/04/2019, 11/02/2019, and 11/01/2019. FINDINGS: Brain: Large right MCA territory infarct is again seen. Hemorrhage the right lentiform nucleus is similar in size and configuration, measuring 4.2 x 2.2 cm on the axial images. Diffuse petechial hemorrhage is present throughout the infarct territory. Subarachnoid blood is noted within the right sylvian  fissure. Right craniectomy is noted with expansion of the hemisphere through the craniotomy. Midline shift persists, measuring 11 mm, stable. Mild prominence of the left lateral ventricle is similar. No acute infarct or hemorrhage is present on the left. The brainstem and cerebellum are within normal limits. Vascular: Atherosclerotic calcifications are present within the cavernous internal carotid arteries bilaterally. Skull: Right craniectomy noted. Calvarium is otherwise unremarkable. Soft tissue changes are noted about the craniectomy. Brain is expanded into the craniectomy. Extracranial soft tissues are otherwise within normal limits. Sinuses/Orbits: The paranasal sinuses  and mastoid air cells are clear. The globes and orbits are within normal limits. IMPRESSION: 1. Stable appearance of large right MCA territory infarct with diffuse petechial hemorrhage. 2. Stable mass effect and midline shift. 3. Stable mild prominence of the left lateral ventricle. Electronically Signed   By: San Morelle M.D.   On: 11/06/2019 05:48   DG CHEST PORT 1 VIEW  Result Date: 11/06/2019 CLINICAL DATA:  Intubation. EXAM: PORTABLE CHEST 1 VIEW COMPARISON:  Chest x-ray 11/04/2019. FINDINGS: Endotracheal tube, NG tube, left PICC line in stable position. Cardiac pacer in stable position. Prior median sternotomy. Heart size stable. Bilateral pulmonary infiltrates/edema and bilateral pleural effusions. No pneumothorax. IMPRESSION: 1.  Lines and tubes in stable position. 2. Bilateral pulmonary infiltrates/edema and bilateral pleural effusions noted on today's exam. 3. Cardiac pacer stable position. Prior median sternotomy. Heart size stable. Electronically Signed   By: Marcello Moores  Register   On: 11/06/2019 05:45

## 2019-11-07 NOTE — Progress Notes (Signed)
Inpatient Diabetes Program Recommendations  AACE/ADA: New Consensus Statement on Inpatient Glycemic Control  Target Ranges:  Prepandial:   less than 140 mg/dL      Peak postprandial:   less than 180 mg/dL (1-2 hours)      Critically ill patients:  140 - 180 mg/dL   Results for Krystal Little, Krystal Little (MRN [id number]) as of 11/07/2019 09:36  Ref. Range 11/06/2019 07:58 11/06/2019 11:27 11/06/2019 15:26 11/06/2019 19:17 11/06/2019 23:07 11/07/2019 03:23 11/07/2019 07:58  Glucose-Capillary Latest Ref Range: 70 - 99 mg/dL 191 (H)  Novolog 10 units  Lantus 20 units @9 :54 206 (H)  Novolog 14 units 270 (H)  Novolog 18 units 235 (H)  Novolog 14 units  Lantus 15 units @21 :44 244 (H)  Novolog 14 units 236 (H)  Novolog 14 units 152 (H)  Novolog 11 units   Review of Glycemic Control  Diabetes history: DM2 Outpatient Diabetes medications: Metformin 1000 mg BID Current orders for Inpatient glycemic control: Lantus 15 units BID, Novolog 0-20 units Q4H, Novolog 7 units Q4H for tube feeding coverage  Inpatient Diabetes Program Recommendations:   Insulin - Basal: Please consider increasing Lantus to 20 units BID.  Thanks, Barnie Alderman, RN, MSN, CDE Diabetes Coordinator Inpatient Diabetes Program [phone] (Team Pager from 8am to 5pm)

## 2019-11-08 ENCOUNTER — Inpatient Hospital Stay (HOSPITAL_COMMUNITY): Payer: Medicare Other

## 2019-11-08 DIAGNOSIS — I63411 Cerebral infarction due to embolism of right middle cerebral artery: Secondary | ICD-10-CM | POA: Diagnosis not present

## 2019-11-08 DIAGNOSIS — J9601 Acute respiratory failure with hypoxia: Secondary | ICD-10-CM | POA: Diagnosis not present

## 2019-11-08 DIAGNOSIS — E1159 Type 2 diabetes mellitus with other circulatory complications: Secondary | ICD-10-CM | POA: Diagnosis not present

## 2019-11-08 DIAGNOSIS — I10 Essential (primary) hypertension: Secondary | ICD-10-CM | POA: Diagnosis not present

## 2019-11-08 DIAGNOSIS — I482 Chronic atrial fibrillation, unspecified: Secondary | ICD-10-CM | POA: Diagnosis not present

## 2019-11-08 LAB — BASIC METABOLIC PANEL
Anion gap: 9 (ref 5–15)
BUN: 18 mg/dL (ref 8–23)
CO2: 33 mmol/L — ABNORMAL HIGH (ref 22–32)
Calcium: 8.4 mg/dL — ABNORMAL LOW (ref 8.9–10.3)
Chloride: 106 mmol/L (ref 98–111)
Creatinine, Ser: 0.61 mg/dL (ref 0.44–1.00)
GFR calc Af Amer: >60 mL/min (ref >60)
GFR calc non Af Amer: >60 mL/min (ref >60)
Glucose, Bld: 201 mg/dL — ABNORMAL HIGH (ref 70–99)
Potassium: 3.3 mmol/L — ABNORMAL LOW (ref 3.5–5.1)
Sodium: 148 mmol/L — ABNORMAL HIGH (ref 135–145)

## 2019-11-08 LAB — CBC
HCT: 30.3 % — ABNORMAL LOW (ref 36.0–46.0)
Hemoglobin: 9.7 g/dL — ABNORMAL LOW (ref 12.0–15.0)
MCH: 30.9 pg (ref 26.0–34.0)
MCHC: 32 g/dL (ref 30.0–36.0)
MCV: 96.5 fL (ref 80.0–100.0)
Platelets: 119 10*3/uL — ABNORMAL LOW (ref 150–400)
RBC: 3.14 MIL/uL — ABNORMAL LOW (ref 3.87–5.11)
RDW: 14.7 % (ref 11.5–15.5)
WBC: 12.5 10*3/uL — ABNORMAL HIGH (ref 4.0–10.5)
nRBC: 0 % (ref 0.0–0.2)

## 2019-11-08 LAB — MAGNESIUM: Magnesium: 2.5 mg/dL — ABNORMAL HIGH (ref 1.7–2.4)

## 2019-11-08 LAB — GLUCOSE, CAPILLARY
Glucose-Capillary: 211 mg/dL — ABNORMAL HIGH (ref 70–99)
Glucose-Capillary: 189 mg/dL — ABNORMAL HIGH (ref 70–99)
Glucose-Capillary: 170 mg/dL — ABNORMAL HIGH (ref 70–99)
Glucose-Capillary: 275 mg/dL — ABNORMAL HIGH (ref 70–99)
Glucose-Capillary: 208 mg/dL — ABNORMAL HIGH (ref 70–99)
Glucose-Capillary: 146 mg/dL — ABNORMAL HIGH (ref 70–99)

## 2019-11-08 LAB — PHOSPHORUS: Phosphorus: 2.7 mg/dL (ref 2.5–4.6)

## 2019-11-08 MED ORDER — SODIUM CHLORIDE 0.9 % IV SOLN
2.0000 g | Freq: Three times a day (TID) | INTRAVENOUS | Status: DC
  Administered 2019-11-08 – 2019-11-10 (×6): 2 g via INTRAVENOUS
  Filled 2019-11-08 (×8): qty 2

## 2019-11-08 MED ORDER — POTASSIUM CHLORIDE 20 MEQ/15ML (10%) PO SOLN
40.0000 meq | ORAL | Status: AC
  Administered 2019-11-08: 40 meq
  Filled 2019-11-08 (×2): qty 30

## 2019-11-08 MED ORDER — POTASSIUM CHLORIDE 20 MEQ/15ML (10%) PO SOLN
40.0000 meq | Freq: Once | ORAL | Status: AC
  Administered 2019-11-08: 40 meq

## 2019-11-08 NOTE — Progress Notes (Signed)
Pharmacy Antibiotic Note  Krystal Little is a 64 y.o. 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  Pharmacy has been consulted for cefepime dosing for sepsis. Tm 100.6, WBC elevated but down to 12.5, and renal function normal.  Plan: Cefepime 2 g IV q8h Monitor renal function, clinical progress, cultures/sensitivities F/U LOT and de-escalate as able  Height: 5\' 4"  (162.6 cm) Weight: 67 kg (147 lb 11.3 oz) IBW/kg (Calculated) : 54.7  Temp (24hrs), Avg:99.5 F (37.5 C), Min:98.1 F (36.7 C), Max:100.6 F (38.1 C)  Recent Labs  Lab 11/04/19 0400 11/04/19 0400 11/04/19 0938 11/05/19 0324 11/06/19 0000 11/06/19 0400 11/07/19 0122 11/07/19 0605 11/08/19 0550  WBC 13.6*  --   --  10.7*  --  8.7  --  14.0* 12.5*  CREATININE 0.85   < > 0.83 0.95  --  0.65  --  0.53 0.61  LATICACIDVEN  --   --   --   --  1.7  --  1.4  --   --    < > = values in this interval not displayed.    Estimated Creatinine Clearance: 66 mL/min (by C-G formula based on SCr of 0.61 mg/dL).    No Known Allergies  Antimicrobials this admission: cefepime 4/24 >>   Dose adjustments this admission:   Microbiology results: 4/13 COVID neg 4/17 MRSA PCR neg 7/23 TA: gram stain few GPC pairs - reinc 4/23 BCx: pending  Thank you for involving pharmacy in this patient's care.  Renold Genta, PharmD, BCPS Clinical Pharmacist Clinical phone for 11/08/2019 until 3p is [phone] 11/08/2019 10:33 AM  **Pharmacist phone directory can be found on [email or web address] listed under [place]**

## 2019-11-08 NOTE — Progress Notes (Signed)
NAME:  Krystal Little, MRN:  [id number], DOB:  07-10-1956, LOS: 8 ADMISSION DATE:  10/31/2019, CONSULTATION DATE:  4/18 REFERRING MD:  Neurology service CHIEF COMPLAINT:  Vent dep post craniotomy    Brief History   53 yobf with hbp, dm, caf on coumadin with pacemaker found down/incontinent  p 2 h since last observed on pm 4/16> to [facility] ED as code stroke with R MCA > IR endovascular thrombectomy with revascularization with large area of edema post pocedure and progressive decrease LOC  > to OR am 4/18 for decompressive hemicraniectomy and return on vent so PCCM service asked to consult.  Past Medical History  HBP CAF on coumadin DM    has a past medical history of Chronic atrial fibrillation ([place]) (1990s), Diabetes mellitus type 2, uncontrolled, without complications, Essential hypertension, History of cardiac catheterization (2001), sick sinus syndrome (07/1999), S/P MVR (mitral valve repair) (08/09/1999), and 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   has a past surgical history that includes Mitral valve repair (07/1999); transthoracic echocardiogram (05/2018); Cardiac catheterization (08/03/1999); Pacemaker insertion (07/1999); Pacemaker generator change (11/26/2008); IR PERCUTANEOUS ART THROMBECTOMY/INFUSION INTRACRANIAL INC DIAG ANGIO (10/31/2019); Radiology with anesthesia (N/A, 10/31/2019); IR PERCUTANEOUS ART THROMBECTOMY/INFUSION INTRACRANIAL INC DIAG ANGIO (10/31/2019); [place] (10/31/2019); and Craniectomy for depressed skull fracture (Right, 11/02/2019).   Significant Hospital Events   4/16 IR thrombectomy   4/18 for decompressive hemicraniectomy. ECHO ef 50% - low normal  4/20 - Cleviprex off overnight along with fentanyl.  Attempted PSV wean this AM but developed tachypnea and tachycardia so started back on cleviprex and fentanyl and switched back to full support for now.  4.21  - on vent. On fent gtt. Off cleviprex x few days per rN. Overngiht A fib RVR and started on amio  gtt. HR still 127 with  A fib RVR but also hypotensive/soft spb 80s-120s. On scheduled cardizem po and prn lopressor. Na 155 - but off 3% saline. MAde only 400cc urine overnight. Husband Krystal Little at beside - reports hx of rheumatic heart disease. Per RN - moves right side purposefully. Per husband moved LLE spontaneously x 1. On low dose fent  Gtt on vent. Doing SBT  4/22 = seen by Dr Curt Bears yesterdya. Continue amio. Increased cardizem. Aim for diuresis when possible and consider NOAC for long term anticoagulation when stable. Off sedtaion. PT/OT workign with patient - > making her sit on side of bed but not natural tone  4/23 -  Off amio gtt./on po amio with cardizem. HR 80s A Fib. On PSV  Via vent.. Off sedatoom gtt. Seems a bit more responsive.  Not fully opening eyes. Per RN - can occ follow commands like wiggle toes. Waxes and wanes.  Husband at bedside - feels she is making good progress. Feels talk about trach might be premature. CT hjead yesterdya - with stable mass effect. Fever 101.2 last night with wbc 15K and PCT 0.88   Consults:  [facility] Neurology PCCM  EP Dr Curt Bears 4/21  Procedures:  PIC LUE 4/17 > Oral et 4/18 > L RA art line 4/18 >  Significant Diagnostic Tests:  CT head 4/18 > Worsened edema within the right MCA territory with increased size. of right basal ganglia intraparenchymal hematoma. Increased amount of subarachnoid blood over the right hemisphere. 11 mm of leftward midline shift at the level of the foramen of Monro with early herniation of the right cingulate gyrus beneath the falx cerebri CT head 4/20 > continued interval evolution of  large right MCA subacute infract with continued interval increase in edema and swelling.  Petechial hemorrhage throughout the infarct has increased.  Similar parenchymal hemorrhage centered within the right basal ganglia.  CT 4/22 -  1. Stable appearance of large right MCA territory infarct with diffuse petechial hemorrhage. 2.  Stable mass effect and midline shift. 3. Stable mild prominence of the left lateral ventricle.    Micro Data:  RVP 4/16 neg  MRSA PCR  4/17 neg  xxx Blood 4/23 ua 4/23 Trach aspirate 4/23  Antimicrobials:   bactriacin 4/18 - 4/18  xxx cefepmine  No Known Allergies     Interim history/subjective:   11/08/2019 -  Temp 100.6 WBC 12.5 but down. PCT 0.6. CXR with R > L lower lowbe consolidation v effusion v atelectasis. Cultures still pending. Platelet better 119K. Witnesed to be moving Both [place] and Right lower extremity. Doing PSV .Eyes closed - per Dr Lavell Anchors - likely apraxia from stroke. Ongoing lasix, cardizenm, and  Lopressor and amio tab. Neuro plans CT head today     Objective   Blood pressure 122/74, pulse (!) 109, temperature (!) 100.6 F (38.1 C), temperature source Axillary, resp. rate (!) 23, height 5\' 4"  (1.626 m), weight 67 kg, SpO2 99 %.    Vent Mode: PSV;CPAP FiO2 (%):  [40 %] 40 % Set Rate:  [15 bmp] 15 bmp Vt Set:  [430 mL] 430 mL PEEP:  [5 cmH20] 5 cmH20 Pressure Support:  [10 cmH20] 10 cmH20 Plateau Pressure:  [14 cmH20-17 cmH20] 14 cmH20   Intake/Output Summary (Last 24 hours) at 11/08/2019 1020 Last data filed at 11/08/2019 0800 Gross per 24 hour  Intake 1539.81 ml  Output 3550 ml  Net -2010.19 ml   Filed Weights   11/06/19 0500 11/07/19 0500 11/08/19 0500  Weight: 67.4 kg 67 kg 67 kg   General Appearance:  Looks criticall ill  Head: evidence of right hemicraniectomy Eyes:  PERRL - x, conjunctiva/corneas - muddy     Ears:  Normal external ear canals, both ears Nose:  G tube - no Throat:  ETT TUBE - yes , OG tube - yes Neck:  Supple,  No enlargement/tenderness/nodules Lungs: Clear to auscultation bilaterally, Ventilator   Synchrony - yes on PSV Heart:  S1 and S2 normal, no murmur, CVP - no.  Pressors - no Abdomen:  Soft, no masses, no organomegaly Genitalia / Rectal:  Not done Extremities:  Extremities- intact Skin:  ntact in exposed  areas . Sacral area - not examind Neurologic:  Sedation - none -> RASS - -3 but gag +. Does not open eyes . Moves all 4s - RUE yes, LUE -  Yes and RLE - yes. CAM-ICU - cannot test . Orientation - not oriented. GAG +         Assessment & Plan:   Acute hypoxemic respiratory failure in setting of emergent hemicraniectomy for vasogenic cerebral edema s/p R MCA/ thrombectomy.    11/08/2019 - > Acute Respiratory Failure dong SBT . Concern might fail extubation given inability to open eyes and onging fevers   Plan PSV as tolerated PRVC to reset No extubation till head ct and abx started No discussion on trach aso f 11/07/19  for another several days to a week given improvement and per family goals   Known permanent A Fib Rapid afib 4/21 in setting of CAF/ has pacemaker. Seen by Dr Curt Bears 11/05/19  11/08/2019  - improved Rate. 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 - Per primary / stroke team.  11/08/2019 - Nas 148 (also on lasix)  Plan - Na target per neuro - CT head  Hyperglycemia. - SSI / lantus.  Electrolyte imbalance  11/08/2019 - low K    Plan  - repleted    Anemia of critical illness  11/08/2019 - no overt bleeding   Plan  - PRBC for hgb </= 6.9gm%    - exceptions are   -  if ACS susepcted/confirmed then transfuse for hgb </= 8.0gm%,  or    -  active bleeding with hemodynamic instability, then transfuse regardless of hemoglobin value   At at all times try to transfuse 1 unit prbc as possible with exception of active hemorrhage   Thrombocytopenia  11/08/2019  - not on heparin. improved  Plan  - monitir  - SCD - consider lovenox  after head ct   Concern for sepsis  11/08/2019 - SIRS 4/22 with PCT 0.88. Still febrile. PCT 0.6  Plan  -start emprici cefepime  -  track sirs markets without antibiotics  - blood culture 4/22  - ua 4/22 - trach aspirate 4/22  Best practice:  Diet: Tube feeds. Pain/Anxiety/Delirium protocol (if indicated):  Prn VAP protocol (if indicated): In place. DVT prophylaxis: SCD. -> check with neuro 4/24 about timing of heparin start given improving platelet count GI prophylaxis: PPI. Glucose control: SSI / lantus. Mobility: BR. Code Status: full code. Family Communication:  4/23 husband at bedside. Feels patient is making good prgress and trach conversation premature / On 4/24 - daughter updated  Disposition:  ICU.  D/w Dr Lavell Anchors at [place]   The patient Krystal Little is critically ill with multiple organ systems failure and requires high complexity decision making for assessment and support, frequent evaluation and titration of therapies, application of advanced monitoring technologies and extensive interpretation of multiple databases.   Critical Care Time devoted to patient care services described in this note is  31  Minutes. This time reflects time of care of this signee Dr Brand Males. This critical care time does not reflect procedure time, or teaching time or supervisory time of PA/NP/Med student/Med Resident etc but could involve care discussion time     Dr. Brand Males, M.D., [facility].C.P Pulmonary and Critical Care Medicine Staff Physician [place] [facility] Pager: [phone], If no answer or between  15:00h - 7:00h: call [phone]  11/08/2019 11:05 AM       LABS    PULMONARY Recent Labs  Lab 11/02/19 0848 11/02/19 1202  PHART 7.442 7.375  PCO2ART 34.1 39.1  PO2ART 339.0* 286.0*  HCO3 23.4 22.9  TCO2 24 24  O2SAT 100.0 100.0    CBC Recent Labs  Lab 11/06/19 0400  11/07/19 0605 11/08/19 0550  HGB 9.0* 10.4* 9.7*  HCT 27.7* 32.0* 30.3*  WBC 8.7 14.0* 12.5*  PLT 75* 110* 119*    COAGULATION Recent Labs  Lab 11/02/19 0243 11/03/19 0408  INR 1.4* 1.3*    CARDIAC  No results for input(s): TROPONINI in the  last 168 hours. No results for input(s): PROBNP in the last 168 hours.   CHEMISTRY Recent Labs  Lab 11/02/19 0243 11/02/19 0343 11/04/19 0938 11/04/19 1655 11/05/19 0324 11/05/19 1219 11/06/19 0400 11/06/19 0400 11/06/19 1210 11/06/19 1800 11/07/19 0122 11/07/19 0605 11/07/19 1303 11/08/19 0550  NA 174*   < > 153*   < > 155*   < > 153*  --    < > 151* 150* 150* 148* 148*  K  --    < > 4.5  --  3.9  --  3.3*   < >  --   --   --  3.2*  --  3.3*  CL  --    < > 126*  --  127*  --  123*  --   --   --   --  109  --  106  CO2  --    < > 21*  --  24  --  24  --   --   --   --  31  --  33*  GLUCOSE  --    < > 313*  --  320*  --  271*  --   --   --   --  166*  --  201*  BUN  --    < > 28*  --  28*  --  16  --   --   --   --  14  --  18  CREATININE  --    < > 0.83  --  0.95  --  0.65  --   --   --   --  0.53  --  0.61  CALCIUM  --    < > 8.3*  --  8.4*  --  7.9*  --   --   --   --  8.4*  --  8.4*  MG 1.9  --   --   --  2.2  --  2.2  --   --   --   --  2.4  --  2.5*  PHOS  --   --   --   --   --   --  2.0*  --   --   --   --  2.7  --  2.7   < > = values in this interval not displayed.   Estimated Creatinine Clearance: 66 mL/min (by C-G formula based on SCr of 0.61 mg/dL).   LIVER Recent Labs  Lab 11/02/19 0243 11/03/19 0408  INR 1.4* 1.3*     INFECTIOUS Recent Labs  Lab 11/06/19 0000 11/06/19 0400 11/07/19 0122 11/07/19 0605  LATICACIDVEN 1.7  --  1.4  --   PROCALCITON  --  0.88  --  0.63     ENDOCRINE CBG (last 3)  Recent Labs    11/07/19 2335 11/08/19 0338 11/08/19 0714  GLUCAP 224* 211* 189*         IMAGING x48h  - image(s) personally visualized  -   highlighted in bold DG CHEST PORT 1  VIEW  Result Date: 11/07/2019 CLINICAL DATA:  Evaluate pleural effusion. EXAM: PORTABLE CHEST 1 VIEW COMPARISON:  November 06, 2019 FINDINGS: The ETT is in good position. The NG tube terminates below today's film. A left PICC line is stable, terminating in the central SVC. No pneumothorax. A layering pleural effusion with underlying opacity in the right base is stable. A smaller layering effusion on the left with associated atelectasis is stable.  No other changes. IMPRESSION: 1. Support apparatus as above. 2. Bilateral pleural effusions, right greater than left, with associated opacity, likely atelectasis. Recommend clinical correlation and attention on follow-up. Electronically Signed   By: Dorise Bullion III M.D   On: 11/07/2019 12:46

## 2019-11-08 NOTE — Progress Notes (Signed)
STROKE TEAM PROGRESS NOTE   INTERVAL HISTORY Noone at bedside. Pt stable neurologically, still eyes closed vs. Opening apraxia. Still follows command on the right. On po amiodarone, still has afib RVR, BP improving, she is off of the cleviprex,  increase metoprolol dose. Continued hyperglycemia, will increase lantus dose, improved, also on tube feeds. repeat CT for the morning.  OBJECTIVE Vitals:   11/08/19 0500 11/08/19 0600 11/08/19 0700 11/08/19 0757  BP: 103/67 (!) 141/73 (!) 150/72 (!) 150/72  Pulse: (!) 103 (!) 120 (!) 115 (!) 111  Resp: (!) 23 (!) 26 (!) 21 (!) 25  Temp:      TempSrc:      SpO2: 98% 99% 97% 99%  Weight: 67 kg     Height:        CBC:  Recent Labs  Lab 11/07/19 0605 11/08/19 0550  WBC 14.0* 12.5*  HGB 10.4* 9.7*  HCT 32.0* 30.3*  MCV 95.5 96.5  PLT 110* 119*    Basic Metabolic Panel:  Recent Labs  Lab 11/07/19 0605 11/07/19 0605 11/07/19 1303 11/08/19 0550  NA 150*   < > 148* 148*  K 3.2*  --   --  3.3*  CL 109  --   --  106  CO2 31  --   --  33*  GLUCOSE 166*  --   --  201*  BUN 14  --   --  18  CREATININE 0.53  --   --  0.61  CALCIUM 8.4*  --   --  8.4*  MG 2.4  --   --  2.5*  PHOS 2.7  --   --  2.7   < > = values in this interval not displayed.    Lipid Panel:     Component Value Date/Time   CHOL 239 (H) 11/01/2019 0348   CHOL 212 (H) 07/15/2018 0907   TRIG 102 11/04/2019 0639   HDL 69 11/01/2019 0348   HDL 50 07/15/2018 0907   CHOLHDL 3.5 11/01/2019 0348   VLDL 12 11/01/2019 0348   LDLCALC 158 (H) 11/01/2019 0348   LDLCALC 138 (H) 07/15/2018 0907   HgbA1c:  Lab Results  Component Value Date   HGBA1C 9.7 (H) 11/01/2019   Urine Drug Screen:     Component Value Date/Time   LABOPIA NONE DETECTED 10/31/2019 1820   COCAINSCRNUR NONE DETECTED 10/31/2019 1820   LABBENZ NONE DETECTED 10/31/2019 1820   AMPHETMU NONE DETECTED 10/31/2019 1820   THCU NONE DETECTED 10/31/2019 1820   LABBARB NONE DETECTED 10/31/2019 1820     Alcohol Level     Component Value Date/Time   ETH <10 10/31/2019 1744    IMAGING CT HEAD CODE STROKE WO CONTRAST 10/31/2019 IMPRESSION:  1. Large right MCA territory nonhemorrhagic infarct is described.  2. Hyperdense right MCA suggesting extensive thrombus from the right ICA terminus through the MCA bifurcation  3. Some mass effect with partial effacement the sulci on the right. No midline shift.  4. ASPECTS is 4/10   CT Code Stroke CTA Head W/WO contrast CT Code Stroke CTA Neck W/WO contrast CT Code Stroke Cerebral Perfusion with contrast 10/31/2019 IMPRESSION:  1. Confirmed large right MCA territory infarct with core volume of 107 mL.  2. Large mismatch volume of 9.6 with a mismatch ratio of 1.9.  3. T occlusion of right ICA terminus.  4. Thrombus extends through the right MCA bifurcation with poor collaterals.  5. Minimal atherosclerotic changes at the carotid bifurcations bilaterally without significant stenosis.  6. No significant intracranial disease on the left or in the posterior circulation.  7. Fetal type right posterior cerebral artery originates just below the terminal ICA thrombus.   Interventional Neuro Radiology - Cerebral Angiogram with Intervention - de Rosario Jacks, MD 10/31/2019 8:20 PM ICA terminus occlusion. Mechanical thrombectomy performed with 2 stent retriever + aspiration passes in the MCA-ICA (solitaire and embotrap), 1 stent retriever + aspiration pass MCA (embotrap) and 1 direct contact aspiration in the right ACA. Complete recanalization achieved (TICI3).  CT HEAD WO CONTRAST 11/01/2019 1. Large area of hypoattenuation within the right MCA territory, consistent with subacute infarct.  2. Multifocal hyperdensity within the right MCA territory is consistent with hemorrhage or contrast staining. The more peripheral hyperdensities are favored to be hemorrhagic.  3. 6 mm leftward midline shift at the level of the foramina of [place].   11/02/2019 3:30 AM 1. Worsened edema within the right MCA territory with increased size of right basal ganglia intraparenchymal hematoma. 2. Increased amount of subarachnoid blood over the right hemisphere. 3. 11 mm of leftward midline shift at the level of the foramen of [place] with early herniation of the right cingulate gyrus beneath the falx cerebri 11/04/2019  1. Postoperative sequela from interval decompressive right hemicraniectomy. 2. Continued interval evolution of a large right MCA vascular territory subacute infarct with continued interval increase in edema and swelling. Petechial hemorrhage throughout the infarct territory has increased. Small volume subarachnoid hemorrhage overlying the right cerebral hemisphere has also slightly increased. Parenchymal hemorrhage centered within the right basal ganglia is similar to slightly increased as compared to 11/02/2019. 3. Similar degree of mass effect with partial effacement of the ventricular system and 11 mm leftward midline shift. As before, there is early leftward subfalcine herniation and persistent partial effacement of the right basal cisterns. 11/06/2019  1. Stable appearance of large right MCA territory infarct with diffuse petechial hemorrhage. 2. Stable mass effect and midline shift. 3. Stable mild prominence of the left lateral ventricle.   DG Chest Port 1 View 11/06/2019 1.  Lines and tubes in stable position. 2. Bilateral pulmonary infiltrates/edema and bilateral pleural effusions noted on today's exam. 3. Cardiac pacer stable position. Prior median sternotomy. Heart size stable. 11/04/2019 1. Complete opacification of the retrocardiac left lung base worrisome for left lower lobe atelectasis. 2. The left arm PICC line tip is at the cavoatrial junction.  11/03/2019 Stable support apparatus. No acute cardiopulmonary abnormality seen.  11/02/2019 1. Well-positioned support structures. No pneumothorax. 2. Stable mild cardiomegaly without  overt pulmonary edema. No active pulmonary disease.  11/01/2019 1. Well-positioned left PICC with tip overlying the cavoatrial junction. 2. Borderline mild cardiomegaly. No pulmonary edema. No active pulmonary disease.  11/01/2019 1. Malpositioned left PICC with tip oriented superiorly in the high right mediastinum, recommend repositioning. 2. No active cardiopulmonary disease.  10/31/2019 Mild cardiomegaly. No acute chest findings.     DG Chest Port 1 View 11/07/2019 IMPRESSION: 1. Support apparatus as above. 2. Bilateral pleural effusions, right greater than left, with associated opacity, likely atelectasis. Recommend clinical correlation and attention on follow-up.  DG Chest Port 1 View - pending 11/08/2019  CUP PACEART [facility] DEVICE CHECK  10/30/2019 Pacemaker check in clinic. Normal device function. Thresholds, sensing, impedances consistent with previous measurements. Device programmed to maximize longevity. No high ventricular rates noted. Device programmed at appropriate safety margins. Histogram  distribution appropriate for patient activity level. Device programmed to optimize intrinsic conduction. Estimated longevity4 years. Patient enrolled in remote follow-up. Patient education  completed. ROV w/ WC.Lavenia Atlas, BSN, RN  ECHOCARDIOGRAM COMPLETE 11/02/2019 1. Left ventricular ejection fraction, by estimation, is 50 to 55%. The left ventricle has low normal function. There is moderate left ventricular hypertrophy. Left ventricular diastolic parameters are indeterminate.  2. Right ventricular systolic function is moderately reduced. The right ventricular size is normal.  3. Left atrial size was moderately dilated.  4. The mitral valve has been repaired. There is a prosthetic annuloplasty ring present in the mitral position. No evidence of mitral valve regurgitation. The mean mitral valve gradient is 4.0 mmHg.  5. Tricuspid valve regurgitation is moderate.  6. The aortic valve was not  well visualized. Aortic valve regurgitation is not visualized. No aortic stenosis is present.    PHYSICAL EXAM   Temp:  [98.1 F (36.7 C)-100 F (37.8 C)] 99.9 F (37.7 C) (04/24 0400) Pulse Rate:  [83-120] 111 (04/24 0757) Resp:  [19-26] 25 (04/24 0757) BP: (89-150)/(48-95) 150/72 (04/24 0757) SpO2:  [97 %-100 %] 99 % (04/24 0757) FiO2 (%):  [40 %] 40 % (04/24 0757) Weight:  [67 kg] 67 kg (04/24 0500)  Neuro stable, no changes   General - Well nourished, well developed, intubated off sedation.  Ophthalmologic - fundi not visualized due to noncooperation.  Cardiovascular - irregularly irregular heart rate and rhythm.  Neuro -following commands on the right, intubated off fentanyl, eyes closed, not open with stimulation, possible eye opening apraxia. With eye forced opening, eyes in right gaze position, not blinking to visual threat, doll's eyes sluggish, bilateral pupil 25mm, sluggish to light. Corneal reflex present, gag and cough present. Breathing over the vent.  Facial symmetry not able to test due to ET tube.  Tongue protrusion not cooperative. However, she was able to follow commands on the right hand and foot. On pain stimulation, RUE and RLE mild withdraw 2/5, LUE extension posturing and LLE slight withdraw. DTR 1+ and bilateral upgoing toes. Sensation, coordination and gait not tested.   ASSESSMENT/PLAN Ms. SUDA FORBESS is a 64 y.o. female with history of HTN, DM2, hx of mitral valve repair, chronic a. Fib (coumadin - INR 1.2 on admission) s/p pacemaker who presented to [facility] ED as a code stroke for right gaze deviation, urinary incontinence and left sided weakness. She did not receive IV t-PA due to anticoagulation with coumadin. IR -> ICA terminus occlusion. Mechanical thrombectomy performed with 2 stent retriever + aspiration passes in the MCA-ICA (solitaire and embotrap), 1 stent retriever + aspiration pass MCA (embotrap) and 1 direct contact aspiration in the right ACA.  Complete recanalization achieved (TICI3).  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 Hyperdense right MCA suggesting extensive thrombus from the right ICA terminus through the MCA bifurcation Some mass effect with partial effacement the sulci on the right. No midline shift. ASPECTS is 4/10   CTA H&N - Total occlusion of right ICA terminus. Thrombus extends through the right MCA bifurcation with poor collaterals.   CT Perfusion - Confirmed large right MCA territory infarct with core volume of 107 mL. Large mismatch volume of 9.6 with a mismatch ratio of 1.9.   CT head -  Large area of hypoattenuation within the right MCA territory, consistent with subacute infarct. Multifocal hyperdensity within the right MCA territory is consistent with hemorrhage or contrast staining. The more  peripheral hyperdensities are favored to be hemorrhagic. 6 mm leftward midline shift  CT Head 11/02/19 - Worsened edema within the right MCA territory with increased size of right basal ganglia intraparenchymal hematoma. Increased amount of subarachnoid blood over the right hemisphere. 11 mm of leftward midline shift  MRI head - not performed - PPM   CT head 4/20 postop changed from R crani. Evolution large R MCA infarct w/ interval increase edema/swelling. Increased petechial hemorrhage throughout. SAH also increased. R BG HT slightly increased. Mass effect, partial effacement ventricles w/ 9mm L midline shift   CT 4/22 stable large R MCA infarct w/ diffuse petechial hemorrhage   2D Echo - EF 50-55%. No source of embolus. 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   Sars Corona Virus 2 - negative  LDL - 158  HgbA1c - 9.7  UDS - negative  VTE prophylaxis - SCDs  warfarin daily prior to admission,  now on No antithrombotic  Therapy recommendations:  CIR - admission coordinator following  Disposition:  Pending  Acute hypoxemia respiratory failure d/t stroke  Intubated for emergent hemicraniectomy for cerebral edema  CCM on board  On fentanyl->prn sedation  CXR 4/20 - Complete opacification of the retrocardiac left lung base worrisome for left lower lobe atelectasis.  CXR 4/22 B pulm infiltrates / edema and B pleural effusions  CXR 4/23 - Bilateral pleural effusions, right greater than left, with associated opacity, likely atelectasis.  CXR 11/08/19 - pending  Lasix 40 and d/c IVF  Cerebral edema w/ subfalcine herniation s/p decompressive hemicraniectomy  CT 4/22 stable large R MCA infarct w/ diffuse petechial hemorrhage  3% saline @ 50 cc/hr -> NS @ 50 -> off  Na 138->...-151-150-150->148->148  NSG on board s/p [facility] 4/18  PICC line placed  23.4% x 2  Now 7 days out, will allow Na to gradually trend down.  Atrial fibrillation w/ RVR  Telemetry showed A. fib, rate controlled  On Coumadin PTA, family stated compliance  Subtherapeutic INR 1.2 on presentation -> 1.4  No AC at the time due to large infarct and hemorrhagic conversion (previously refused DOACs)  metoprolol 25 bid->50 bid -> d/c -> 50 bid  On cardizem 60 q 8h->60 q 6h  amiodarone gtt converted to po 4/22 - 400 bid x 1 week then 400 daily  EP Cardiology on board  Hypertension->Hypotension  Home BP meds: Atenolol; Cardizem ; lisinopril  Current BP meds: cardizem, metoprolol 50 bid  Off cleviprex  lisinopril d/c due to low BP  BP improved  Resume metoprolol 25 bid -> 50 bid . SBP goal < 160 mm Hg (SBP 103 - 150 today) . Long-term BP goal normotensive  Hyperlipidemia  Home Lipid lowering medication: zetia  LDL 158, goal < 70  Hold off statin for know due to left hemorrhagic conversion  Consider statin at discharge  Diabetes, uncontrolled  Home diabetic meds:  metformin  HgbA1c 9.7, goal < 7.0  CBG monitoring  Hyperglycemia - on lantus 10->20->15 bid->25 bid, novolog 4u->7u q4h  Continue SSI 0-20  DM coordinator consulted  Anemia and thrombocytopenia, improving  Platelet 168->...->75->110->119  Hb 11.5->...9.0->10.4->9.7  Close monitoring  Dysphagia  N.p.o. for now  OG placed  On TF @ 68  Speech following  Other Stroke Risk Factors  Advanced age  ETOH use, advised to drink no more than 1 alcoholic beverage per day.  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 fib, dysphagia and at significant risk of neurological worsening, death form brain herniation, cerebral edema, recurrent stroke, hematoma expansion, heart failure, aspiration pneumonia, shock.   This patient is critically ill and at significant risk of neurological worsening, death and care requires constant monitoring of vital signs, hemodynamics,respiratory and cardiac monitoring,review of multiple databases, neurological assessment, discussion with family, other specialists and medical decision making of high complexity.I  I spent 35 minutes of neurocritical care time in the care of this patient.  Sarina Ill, MD [facility]      To contact Stroke Continuity provider, please refer to [email or web address] After hours, contact General Neurology

## 2019-11-09 ENCOUNTER — Inpatient Hospital Stay (HOSPITAL_COMMUNITY): Payer: Medicare Other

## 2019-11-09 DIAGNOSIS — J9601 Acute respiratory failure with hypoxia: Secondary | ICD-10-CM | POA: Diagnosis not present

## 2019-11-09 DIAGNOSIS — I482 Chronic atrial fibrillation, unspecified: Secondary | ICD-10-CM | POA: Diagnosis not present

## 2019-11-09 DIAGNOSIS — I63411 Cerebral infarction due to embolism of right middle cerebral artery: Secondary | ICD-10-CM | POA: Diagnosis not present

## 2019-11-09 LAB — BASIC METABOLIC PANEL
Anion gap: 11 (ref 5–15)
BUN: 22 mg/dL (ref 8–23)
CO2: 33 mmol/L — ABNORMAL HIGH (ref 22–32)
Calcium: 8.6 mg/dL — ABNORMAL LOW (ref 8.9–10.3)
Chloride: 105 mmol/L (ref 98–111)
Creatinine, Ser: 0.8 mg/dL (ref 0.44–1.00)
GFR calc Af Amer: >60 mL/min (ref >60)
GFR calc non Af Amer: >60 mL/min (ref >60)
Glucose, Bld: 175 mg/dL — ABNORMAL HIGH (ref 70–99)
Potassium: 3.6 mmol/L (ref 3.5–5.1)
Sodium: 149 mmol/L — ABNORMAL HIGH (ref 135–145)

## 2019-11-09 LAB — POCT I-STAT 7, (LYTES, BLD GAS, ICA,H+H)
Acid-Base Excess: 12 mmol/L — ABNORMAL HIGH (ref 0.0–2.0)
Bicarbonate: 35.5 mmol/L — ABNORMAL HIGH (ref 20.0–28.0)
Calcium, Ion: 1.09 mmol/L — ABNORMAL LOW (ref 1.15–1.40)
HCT: 41 % (ref 36.0–46.0)
Hemoglobin: 13.9 g/dL (ref 12.0–15.0)
O2 Saturation: 94 %
Patient temperature: 100.4
Potassium: 3.3 mmol/L — ABNORMAL LOW (ref 3.5–5.1)
Sodium: 149 mmol/L — ABNORMAL HIGH (ref 135–145)
TCO2: 37 mmol/L — ABNORMAL HIGH (ref 22–32)
pCO2 arterial: 40.2 mmHg (ref 32.0–48.0)
pH, Arterial: 7.558 — ABNORMAL HIGH (ref 7.350–7.450)
pO2, Arterial: 64 mmHg — ABNORMAL LOW (ref 83.0–108.0)

## 2019-11-09 LAB — CBC
HCT: 32.5 % — ABNORMAL LOW (ref 36.0–46.0)
Hemoglobin: 10.3 g/dL — ABNORMAL LOW (ref 12.0–15.0)
MCH: 30.9 pg (ref 26.0–34.0)
MCHC: 31.7 g/dL (ref 30.0–36.0)
MCV: 97.6 fL (ref 80.0–100.0)
Platelets: 157 10*3/uL (ref 150–400)
RBC: 3.33 MIL/uL — ABNORMAL LOW (ref 3.87–5.11)
RDW: 14.6 % (ref 11.5–15.5)
WBC: 14.5 10*3/uL — ABNORMAL HIGH (ref 4.0–10.5)
nRBC: 0.1 % (ref 0.0–0.2)

## 2019-11-09 LAB — MAGNESIUM: Magnesium: 2.5 mg/dL — ABNORMAL HIGH (ref 1.7–2.4)

## 2019-11-09 LAB — URINALYSIS, ROUTINE W REFLEX MICROSCOPIC
Bilirubin Urine: NEGATIVE
Glucose, UA: NEGATIVE mg/dL
Hgb urine dipstick: NEGATIVE
Ketones, ur: NEGATIVE mg/dL
Leukocytes,Ua: NEGATIVE
Nitrite: NEGATIVE
Protein, ur: NEGATIVE mg/dL
Specific Gravity, Urine: 1.015 (ref 1.005–1.030)
pH: 8 (ref 5.0–8.0)

## 2019-11-09 LAB — CULTURE, RESPIRATORY W GRAM STAIN: Culture: NORMAL

## 2019-11-09 LAB — GLUCOSE, CAPILLARY
Glucose-Capillary: 111 mg/dL — ABNORMAL HIGH (ref 70–99)
Glucose-Capillary: 145 mg/dL — ABNORMAL HIGH (ref 70–99)
Glucose-Capillary: 174 mg/dL — ABNORMAL HIGH (ref 70–99)
Glucose-Capillary: 199 mg/dL — ABNORMAL HIGH (ref 70–99)
Glucose-Capillary: 245 mg/dL — ABNORMAL HIGH (ref 70–99)
Glucose-Capillary: 264 mg/dL — ABNORMAL HIGH (ref 70–99)

## 2019-11-09 LAB — LACTIC ACID, PLASMA: Lactic Acid, Venous: 1.4 mmol/L (ref 0.5–1.9)

## 2019-11-09 LAB — PHOSPHORUS: Phosphorus: 3.7 mg/dL (ref 2.5–4.6)

## 2019-11-09 MED ORDER — POTASSIUM CHLORIDE 20 MEQ/15ML (10%) PO SOLN
40.0000 meq | Freq: Once | ORAL | Status: AC
  Administered 2019-11-09: 12:00:00 40 meq
  Filled 2019-11-09: qty 30

## 2019-11-09 MED ORDER — ENOXAPARIN SODIUM 40 MG/0.4ML ~~LOC~~ SOLN
40.0000 mg | SUBCUTANEOUS | Status: DC
  Administered 2019-11-09 – 2019-11-20 (×12): 40 mg via SUBCUTANEOUS
  Filled 2019-11-09 (×10): qty 0.4

## 2019-11-09 NOTE — Progress Notes (Signed)
STROKE TEAM PROGRESS NOTE   INTERVAL HISTORY No one at bedside. Pt stable neurologically, still eyes closed vs. Opening apraxia. Still follows command on the right. On po amiodarone and multiple rate controlling medications, hypotensive may consider decreasing metoprolol to 25, still has afib RVR, BP hypotensive, she is off of the cleviprex,  Continued hyperglycemia, will increase lantus dose, improved, also on tube feeds. repeat CT this morning was stable. Discussed with Dr. Dillard Essex today will try to extubate.   OBJECTIVE Vitals:   11/09/19 1200 11/09/19 1201 11/09/19 1300 11/09/19 1400  BP: 138/73  (!) 110/48 124/78  Pulse: (!) 107  60 95  Resp: (!) 24  (!) 25 20  Temp: (!) 100.4 F (38 C)     TempSrc: Axillary     SpO2: 97% 98% 98% 98%  Weight:      Height:        CBC:  Recent Labs  Lab 11/08/19 0550 11/09/19 0341  WBC 12.5* 14.5*  HGB 9.7* 10.3*  HCT 30.3* 32.5*  MCV 96.5 97.6  PLT 119* 622    Basic Metabolic Panel:  Recent Labs  Lab 11/08/19 0550 11/09/19 0341  NA 148* 149*  K 3.3* 3.6  CL 106 105  CO2 33* 33*  GLUCOSE 201* 175*  BUN 18 22  CREATININE 0.61 0.80  CALCIUM 8.4* 8.6*  MG 2.5* 2.5*  PHOS 2.7 3.7    Lipid Panel:     Component Value Date/Time   CHOL 239 (H) 11/01/2019 0348   CHOL 212 (H) 07/15/2018 0907   TRIG 102 11/04/2019 0639   HDL 69 11/01/2019 0348   HDL 50 07/15/2018 0907   CHOLHDL 3.5 11/01/2019 0348   VLDL 12 11/01/2019 0348   LDLCALC 158 (H) 11/01/2019 0348   LDLCALC 138 (H) 07/15/2018 0907   HgbA1c:  Lab Results  Component Value Date   HGBA1C 9.7 (H) 11/01/2019   Urine Drug Screen:     Component Value Date/Time   LABOPIA NONE DETECTED 10/31/2019 1820   COCAINSCRNUR NONE DETECTED 10/31/2019 1820   LABBENZ NONE DETECTED 10/31/2019 1820   AMPHETMU NONE DETECTED 10/31/2019 1820   THCU NONE DETECTED 10/31/2019 1820   LABBARB NONE DETECTED 10/31/2019 1820    Alcohol Level     Component Value Date/Time   ETH <10  10/31/2019 1744    IMAGING CT HEAD CODE STROKE WO CONTRAST 10/31/2019 IMPRESSION:  1. Large right MCA territory nonhemorrhagic infarct is described.  2. Hyperdense right MCA suggesting extensive thrombus from the right ICA terminus through the MCA bifurcation  3. Some mass effect with partial effacement the sulci on the right. No midline shift.  4. ASPECTS is 4/10   CT Code Stroke CTA Head W/WO contrast CT Code Stroke CTA Neck W/WO contrast CT Code Stroke Cerebral Perfusion with contrast 10/31/2019 IMPRESSION:  1. Confirmed large right MCA territory infarct with core volume of 107 mL.  2. Large mismatch volume of 9.6 with a mismatch ratio of 1.9.  3. T occlusion of right ICA terminus.  4. Thrombus extends through the right MCA bifurcation with poor collaterals.  5. Minimal atherosclerotic changes at the carotid bifurcations bilaterally without significant stenosis.  6. No significant intracranial disease on the left or in the posterior circulation.  7. Fetal type right posterior cerebral artery originates just below the terminal ICA thrombus.   Interventional Neuro Radiology - Cerebral Angiogram with Intervention - de Rosario Jacks, MD 10/31/2019 8:20 PM ICA terminus occlusion. Mechanical thrombectomy performed with 2 stent  retriever + aspiration passes in the Rafael Gonzalez (solitaire and embotrap), 1 stent retriever + aspiration pass MCA (embotrap) and 1 direct contact aspiration in the right ACA. Complete recanalization achieved (TICI3).  CT HEAD WO CONTRAST 11/01/2019 1. Large area of hypoattenuation within the right MCA territory, consistent with subacute infarct.  2. Multifocal hyperdensity within the right MCA territory is consistent with hemorrhage or contrast staining. The more peripheral hyperdensities are favored to be hemorrhagic.  3. 6 mm leftward midline shift at the level of the foramina of [place].  11/02/2019 3:30 AM 1. Worsened edema within the right MCA territory  with increased size of right basal ganglia intraparenchymal hematoma. 2. Increased amount of subarachnoid blood over the right hemisphere. 3. 11 mm of leftward midline shift at the level of the foramen of [place] with early herniation of the right cingulate gyrus beneath the falx cerebri 11/04/2019  1. Postoperative sequela from interval decompressive right hemicraniectomy. 2. Continued interval evolution of a large right MCA vascular territory subacute infarct with continued interval increase in edema and swelling. Petechial hemorrhage throughout the infarct territory has increased. Small volume subarachnoid hemorrhage overlying the right cerebral hemisphere has also slightly increased. Parenchymal hemorrhage centered within the right basal ganglia is similar to slightly increased as compared to 11/02/2019. 3. Similar degree of mass effect with partial effacement of the ventricular system and 11 mm leftward midline shift. As before, there is early leftward subfalcine herniation and persistent partial effacement of the right basal cisterns. 11/06/2019  1. Stable appearance of large right MCA territory infarct with diffuse petechial hemorrhage. 2. Stable mass effect and midline shift. 3. Stable mild prominence of the left lateral ventricle.   DG Chest Port 1 View 11/06/2019 1.  Lines and tubes in stable position. 2. Bilateral pulmonary infiltrates/edema and bilateral pleural effusions noted on today's exam. 3. Cardiac pacer stable position. Prior median sternotomy. Heart size stable. 11/04/2019 1. Complete opacification of the retrocardiac left lung base worrisome for left lower lobe atelectasis. 2. The left arm PICC line tip is at the cavoatrial junction.  11/03/2019 Stable support apparatus. No acute cardiopulmonary abnormality seen.  11/02/2019 1. Well-positioned support structures. No pneumothorax. 2. Stable mild cardiomegaly without overt pulmonary edema. No active pulmonary disease.  11/01/2019 1.  Well-positioned left PICC with tip overlying the cavoatrial junction. 2. Borderline mild cardiomegaly. No pulmonary edema. No active pulmonary disease.  11/01/2019 1. Malpositioned left PICC with tip oriented superiorly in the high right mediastinum, recommend repositioning. 2. No active cardiopulmonary disease.  10/31/2019 Mild cardiomegaly. No acute chest findings.     DG Chest Port 1 View 11/07/2019 IMPRESSION: 1. Support apparatus as above. 2. Bilateral pleural effusions, right greater than left, with associated opacity, likely atelectasis. Recommend clinical correlation and attention on follow-up.  DG Chest Port 1 View  11/08/2019 IMPRESSION: 1. Support apparatus as above. 2. Small bilateral pleural effusions with underlying opacities, likely atelectasis. No other changes.  DG Chest Port 1 View 11/09/2019 IMPRESSION: 1. Support apparatus as above. 2. Stable small right effusion with underlying atelectasis. Stableeft retrocardiac opacity. The small layering left effusion seen previously is less prominent.  CT Head WO Contrast 11/09/2019 IMPRESSION: 1. Continued interval evolution of large right MCA territory infarct with associated hemorrhagic transformation, relatively stable as compared to 11/06/2019. No evidence for interval hemorrhage or re-bleeding. Associated mass effect with up to 12 mm of right-to-left shift not significantly changed. Stable asymmetric dilatation of the left lateral ventricle. 2. Right craniectomy without adverse features.   CUP PACEART  INCLINIC DEVICE CHECK  10/30/2019 Pacemaker check in clinic. Normal device function. Thresholds, sensing, impedances consistent with previous measurements. Device programmed to maximize longevity. No high ventricular rates noted. Device programmed at appropriate safety margins. Histogram  distribution appropriate for patient activity level. Device programmed to optimize intrinsic conduction. Estimated longevity4 years. Patient  enrolled in remote follow-up. Patient education completed. ROV w/ WC.Lavenia Atlas, BSN, RN  ECHOCARDIOGRAM COMPLETE 11/02/2019 1. Left ventricular ejection fraction, by estimation, is 50 to 55%. The left ventricle has low normal function. There is moderate left ventricular hypertrophy. Left ventricular diastolic parameters are indeterminate.  2. Right ventricular systolic function is moderately reduced. The right ventricular size is normal.  3. Left atrial size was moderately dilated.  4. The mitral valve has been repaired. There is a prosthetic annuloplasty ring present in the mitral position. No evidence of mitral valve regurgitation. The mean mitral valve gradient is 4.0 mmHg.  5. Tricuspid valve regurgitation is moderate.  6. The aortic valve was not well visualized. Aortic valve regurgitation is not visualized. No aortic stenosis is present.    PHYSICAL EXAM   Temp:  [99 F (37.2 C)-100.4 F (38 C)] 100.4 F (38 C) (04/25 1200) Pulse Rate:  [60-117] 95 (04/25 1400) Resp:  [20-30] 20 (04/25 1400) BP: (88-147)/(48-86) 124/78 (04/25 1400) SpO2:  [94 %-100 %] 98 % (04/25 1400) FiO2 (%):  [40 %] 40 % (04/25 1135) Weight:  [67 kg] 67 kg (04/25 0500)  Neuro stable, no changes   General - Well nourished, well developed, intubated off sedation.  Ophthalmologic - fundi not visualized due to noncooperation.  Cardiovascular - irregularly irregular heart rate and rhythm.  Neuro -following commands on the right, intubated off fentanyl, eyes closed, not open with stimulation, possible eye opening apraxia. With eye forced opening, eyes still in right gaze position, not blinking to visual threat on left, doll's eyes +, bilateral pupil 26mm, sluggish to light. Corneal reflex present, gag and cough present. Breathing over the vent.  Facial symmetry not able to test due to ET tube.  Tongue protrusion not cooperative. However, she was able to follow commands on the right hand and foot. On pain  stimulation, RUE and RLE mild withdraw 2/5, LUE extension posturing and LLE slight withdraw. DTR 1+ and bilateral upgoing toes. Sensation, coordination and gait not tested.   ASSESSMENT/PLAN Krystal Little is a 64 y.o. female with history of HTN, DM2, hx of mitral valve repair, chronic a. Fib (coumadin - INR 1.2 on admission) s/p pacemaker who presented to [facility] ED as a code stroke for right gaze deviation, urinary incontinence and left sided weakness. She did not receive IV t-PA due to anticoagulation with coumadin. IR -> ICA terminus occlusion. Mechanical thrombectomy performed with 2 stent retriever + aspiration passes in the MCA-ICA (solitaire and embotrap), 1 stent retriever + aspiration pass MCA (embotrap) and 1 direct contact aspiration in the right ACA. Complete recanalization achieved (TICI3).  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 Hyperdense right MCA suggesting extensive thrombus from the right ICA terminus through the MCA bifurcation Some mass effect with partial effacement the sulci on the right. No midline shift. ASPECTS is 4/10   CTA H&N - Total occlusion of right ICA terminus. Thrombus extends through the right MCA bifurcation with poor collaterals.  CT Perfusion - Confirmed large right MCA territory infarct with core volume of 107 mL. Large mismatch volume of 9.6 with a mismatch ratio of 1.9.   CT head -  Large area of hypoattenuation within the right MCA territory, consistent with subacute infarct. Multifocal hyperdensity within the right MCA territory is consistent with hemorrhage or contrast staining. The more peripheral hyperdensities are favored to be hemorrhagic. 6 mm leftward midline shift  CT Head 11/02/19 - Worsened edema within the right MCA territory with  increased size of right basal ganglia intraparenchymal hematoma. Increased amount of subarachnoid blood over the right hemisphere. 11 mm of leftward midline shift  MRI head - not performed - PPM   CT head 4/20 postop changed from R crani. Evolution large R MCA infarct w/ interval increase edema/swelling. Increased petechial hemorrhage throughout. SAH also increased. R BG HT slightly increased. Mass effect, partial effacement ventricles w/ 76mm L midline shift   CT 4/22 stable large R MCA infarct w/ diffuse petechial hemorrhage   CT Head 11/09/19 - Continued interval evolution of large right MCA territory infarct with associated hemorrhagic transformation, relatively stable as compared to 11/06/2019. No evidence for interval hemorrhage or re-bleeding. Associated mass effect with up to 12 mm of right-to-left shift not significantly changed. Stable asymmetric dilatation of the left lateral ventricle.  2D Echo - EF 50-55%. No source of embolus. 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   Sars Corona Virus 2 - negative  LDL - 158  HgbA1c - 9.7  UDS - negative  VTE prophylaxis - SCDs  warfarin daily prior to admission, now on No antithrombotic  Therapy recommendations:  CIR - admission coordinator following  Disposition:  Pending  Acute hypoxemia respiratory failure d/t stroke  Intubated for emergent hemicraniectomy for cerebral edema  CCM on board  On fentanyl->prn sedation  CXR 4/20 - Complete opacification of the retrocardiac left lung base worrisome for left lower lobe atelectasis.  CXR 4/22 B pulm infiltrates / edema and B pleural effusions  CXR 4/23 - Bilateral pleural effusions, right greater than left, with associated opacity, likely atelectasis.  CXR 11/09/19 - Stable small right effusion with underlying atelectasis. Stableeft retrocardiac opacity. The small layering left effusion seen previously is less prominent.  Lasix 40 and d/c IVF  Cerebral edema w/  subfalcine herniation s/p decompressive hemicraniectomy  CT 4/22 stable large R MCA infarct w/ diffuse petechial hemorrhage  3% saline @ 50 cc/hr -> NS @ 50 -> off  Na 138->...-151-150-150->148->148  NSG on board s/p [facility] 4/18  PICC line placed  23.4% x 2  Now 7 days out, will allow Na to gradually trend down.  Atrial fibrillation w/ RVR  Telemetry showed A. fib, rate controlled  On Coumadin PTA, family stated compliance  Subtherapeutic INR 1.2 on presentation -> 1.4  No AC at the time due to large infarct and hemorrhagic conversion (previously refused DOACs)  metoprolol 25 bid->50 bid -> d/c -> 50 bid  On cardizem 60 q 8h->60 q 6h  amiodarone gtt converted to po 4/22 - 400 bid x 1 week then 400 daily  EP Cardiology on board  Hypertension->Hypotension  Home BP meds: Atenolol; Cardizem ; lisinopril  Current BP meds: cardizem, metoprolol 50 bid  Off cleviprex  lisinopril d/c due to low BP  BP improved  Resume metoprolol 25 bid -> 50 bid . SBP goal < 160 mm Hg (SBP 100 - 128 today) . Long-term BP goal normotensive  Hyperlipidemia  Home Lipid lowering medication: zetia  LDL 158, goal < 70  Hold off statin for know due to left hemorrhagic conversion  Consider statin at discharge  Diabetes, uncontrolled  Home diabetic meds: metformin  HgbA1c 9.7, goal < 7.0  CBG monitoring  Hyperglycemia - on lantus 10->20->15 bid->25 bid, novolog 4u->7u q4h  Continue SSI 0-20  DM coordinator consulted  Anemia and thrombocytopenia, improving  Platelet 168->...->75->110->119->157  Hb 11.5->...9.0->10.4->9.7->10.3  Close monitoring  Dysphagia  N.p.o. for now  OG placed  On TF @ 89  Speech following  Other Stroke Risk Factors  Advanced age  ETOH use, advised to drink no more than 1 alcoholic beverage per day.  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 fib, dysphagia and at significant risk of neurological worsening, death form brain herniation, cerebral edema, recurrent stroke, hematoma expansion, heart failure, aspiration pneumonia, shock.   This patient is critically ill and at significant risk of neurological worsening, death and care requires constant monitoring of vital signs, hemodynamics,respiratory and cardiac monitoring,review of multiple databases, neurological assessment, discussion with family, other specialists and medical decision making of high complexity.I  I spent 30 minutes of neurocritical care time in the care of this patient.       To contact Stroke Continuity provider, please refer to [email or web address] After hours, contact General Neurology

## 2019-11-09 NOTE — Progress Notes (Signed)
Transport to CT and back with RN without any complications

## 2019-11-09 NOTE — Procedures (Signed)
Extubation Procedure Note  Patient Details:   Name: MARYFER TAUZIN DOB: 1955/09/03 MRN: [id number]   Airway Documentation:  Patient extubated per orders. Patient had positive cuff leak prior to extubation. Placed on 4lpm nasal cannula. Will continue to monitor.  Vent end date: 11/09/19 Vent end time: 1200   Evaluation  O2 sats: stable throughout Complications: No apparent complications Patient did tolerate procedure well. Bilateral Breath Sounds: Clear, Diminished   Yes  Ander Purpura 11/09/2019, 12:00 PM

## 2019-11-09 NOTE — Progress Notes (Addendum)
NAME:  Krystal Little, MRN:  [id number], DOB:  Jun 30, 1956, LOS: 9 ADMISSION DATE:  10/31/2019, CONSULTATION DATE:  4/18 REFERRING MD:  Neurology service CHIEF COMPLAINT:  Vent dep post craniotomy    Brief History   58 yobf with hbp, dm, caf on coumadin with pacemaker found down/incontinent  p 2 h since last observed on pm 4/16> to [facility] ED as code stroke with R MCA > IR endovascular thrombectomy with revascularization with large area of edema post pocedure and progressive decrease LOC  > to OR am 4/18 for decompressive hemicraniectomy and return on vent so PCCM service asked to consult.  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   has a past surgical history that includes Mitral valve repair (07/1999); transthoracic echocardiogram (05/2018); Cardiac catheterization (08/03/1999); Pacemaker insertion (07/1999); Pacemaker generator change (11/26/2008); IR PERCUTANEOUS ART THROMBECTOMY/INFUSION INTRACRANIAL INC DIAG ANGIO (10/31/2019); Radiology with anesthesia (N/A, 10/31/2019); IR PERCUTANEOUS ART THROMBECTOMY/INFUSION INTRACRANIAL INC DIAG ANGIO (10/31/2019); [place] (10/31/2019); and Craniectomy for depressed skull fracture (Right, 11/02/2019).   Significant Hospital Events   4/16 IR thrombectomy   4/18 for decompressive hemicraniectomy. ECHO ef 50% - low normal  4/20 - Cleviprex off overnight along with fentanyl.  Attempted PSV wean this AM but developed tachypnea and tachycardia so started back on cleviprex and fentanyl and switched back to full support for now.  4.21  - on vent. On fent gtt. Off cleviprex x few days per rN. Overngiht A fib RVR and started on amio  gtt. HR still 127 with  A fib RVR but also hypotensive/soft spb 80s-120s. On scheduled cardizem po and prn lopressor. Na 155 - but off 3% saline. MAde only 400cc urine overnight. Husband Krystal Little at beside - reports hx of rheumatic heart disease. Per RN - moves right side purposefully. Per husband moved LLE spontaneously x 1. On low dose fent  Gtt on vent. Doing SBT  4/22 = seen by Dr Curt Bears yesterdya. Continue amio. Increased cardizem. Aim for diuresis when possible and consider NOAC for long term anticoagulation when stable. Off sedtaion. PT/OT workign with patient - > making her sit on side of bed but not natural tone  4/23 -  Off amio gtt./on po amio with cardizem. HR 80s A Fib. On PSV  Via vent.. Off sedatoom gtt. Seems a bit more responsive.  Not fully opening eyes. Per RN - can occ follow commands like wiggle toes. Waxes and wanes.  Husband at bedside - feels she is making good progress. Feels talk about trach might be premature. CT hjead yesterdya - with stable mass effect. Fever 101.2 last night with wbc 15K and PCT 0.88  4/24 - Temp 100.6 WBC 12.5 but down. PCT 0.6. CXR with R > L lower lowbe consolidation v effusion v atelectasis. Cultures still pending. Platelet better 119K. Witnesed to be moving Both [place] and Right lower extremity. Doing PSV .Eyes closed - per Dr Lavell Anchors - likely apraxia from stroke. Ongoing lasix, cardizenm, and  Lopressor and amio tab. Neuro plans CT head today   Consults:  NSG Neurology PCCM  EP Dr Curt Bears 4/21  Procedures:  PIC LUE 4/17 > Oral et 4/18 > L RA art line 4/18 >  Significant  Diagnostic Tests:  CT head 4/18 > Worsened edema within the right MCA territory with increased size. of right basal ganglia intraparenchymal hematoma. Increased amount of subarachnoid blood over the right hemisphere. 11 mm of leftward midline shift at the level of the foramen of [place] with early herniation of the right cingulate gyrus beneath the falx cerebri CT head  4/20 > continued interval evolution of large right MCA subacute infract with continued interval increase in edema and swelling.  Petechial hemorrhage throughout the infarct has increased.  Similar parenchymal hemorrhage centered within the right basal ganglia.  CT 4/22 -  1. Stable appearance of large right MCA territory infarct with diffuse petechial hemorrhage. 2. Stable mass effect and midline shift. 3. Stable mild prominence of the left lateral ventricle.  CT head 4/25 1. Continued interval evolution of large right MCA territory infarct with associated hemorrhagic transformation, relatively stable as compared to 11/06/2019. No evidence for interval hemorrhage or re-bleeding. Associated mass effect with up to 12 mm of right-to-left shift not significantly changed. Stable asymmetric dilatation of the left lateral ventricle. 2. Right craniectomy without adverse features.    Micro Data:  RVP 4/16 neg  MRSA PCR  4/17 neg  xxx Blood 4/23 ua 4/23 Trach aspirate 4/23  Antimicrobials:   bactriacin 4/18 - 4/18  xxx cefepmine  No Known Allergies     Interim history/subjective:   11/09/2019 -  Per RN Dr Lavell Anchors ok with extubation. CT head stable per rerport. Doing SBT . Eyes closed . No sedation. Gag +. Follows commands on right but no movement on left. Low grade fever + despite abx . Culture neg so far    Objective   Blood pressure 138/76, pulse (!) 107, temperature 99.8 F (37.7 C), temperature source Axillary, resp. rate (!) 25, height 5\' 4"  (1.626 m), weight 67 kg, SpO2 98 %.    Vent Mode: PSV;CPAP FiO2 (%):  [40 %] 40 % Set Rate:  [15 bmp] 15 bmp Vt Set:  [430 mL] 430 mL PEEP:  [5 cmH20] 5 cmH20 Pressure Support:  [10 cmH20] 10 cmH20 Plateau Pressure:  [14 cmH20-16 cmH20] 14 cmH20   Intake/Output Summary (Last 24 hours) at 11/09/2019 1126 Last data filed at 11/09/2019 1100 Gross per 24 hour  Intake 2177.18 ml  Output 800 ml  Net 1377.18 ml   Filed Weights    11/07/19 0500 11/08/19 0500 11/09/19 0500  Weight: 67 kg 67 kg 67 kg    General Appearance:  onv ent Head:  Evidence of rt hemicaniectomy Eyes:  PERRL - yes, conjunctiva/corneas - muddy but eyes close     Ears:  Normal external ear canals, both ears Nose:  G tube - no Throat:  ETT TUBE - yes , OG tube - yes Neck:  Supple,  No enlargement/tenderness/nodules Lungs: Clear to auscultation bilaterally, Ventilator   Synchrony - yes on PSV Heart:  S1 and S2 normal, no murmur, CVP - no.  Pressors - no Abdomen:  Soft, no masses, no organomegaly Genitalia / Rectal:  Not done Extremities:  Extremities- intact Skin:  ntact in exposed areas . Sacral area - not examined Neurologic:  Sedation - none -> RASS - -3 . Moves all 4s - only right side. CAM-ICU - follows commands on right. . Orientation - no. Gag +            Assessment & Plan:   Acute hypoxemic respiratory failure in setting of emergent hemicraniectomy for vasogenic cerebral edema s/p R MCA/ thrombectomy.  11/09/2019 - > Acute Respiratory Failure dong SBT .    Plan Extubate 11/09/19 Reintubate if fails - the needs trach  Known permanent A Fib Rapid afib 4/21 in setting of CAF/ has pacemaker. Seen by Dr Curt Bears 11/05/19  11/09/2019  - improved Rate. 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 - Per primary / stroke team.  11/09/2019 - Nas 149 (also on lasix)  Plan - Na target per neuro - CT head  Hyperglycemia. - SSI / lantus.  Electrolyte imbalance  11/09/2019 - low K    Plan  - replete   Anemia of critical illness  11/09/2019 - no overt bleeding   Plan  - PRBC for hgb </= 6.9gm%    -  exceptions are   -  if ACS susepcted/confirmed then transfuse for hgb </= 8.0gm%,  or    -  active bleeding with hemodynamic instability, then transfuse regardless of hemoglobin value   At at all times try to transfuse 1 unit prbc as possible with exception of active hemorrhage   Thrombocytopenia  11/09/2019  - not on heparin. Resolved on 11/09/19   Plan  - monitir  - SCD -need timing on sq lovenox/heparin from neuro   Concern for sepsis  11/09/2019 - SIRS 4/22 with PCT 0.88. Still febrile. PCT 0.6  Plan  -start emprici cefepime  -  track sirs markets without antibiotics  - blood culture 4/22  - ua 4/22 - trach aspirate 4/22  Best practice:  Diet: Tube feeds.=> hold for extubation Pain/Anxiety/Delirium protocol (if indicated):  Prn VAP protocol (if indicated): In place. DVT prophylaxis: SCD. ->  lovenox/heparin sq (plat normal 4/26) start . D/w Dr Lavell Anchors GI prophylaxis: PPI. Glucose control: SSI / lantus.  Mobility: BR.  Code Status: full code.  Family Communication:  4/23 husband at bedside. Feels patient is making good prgress and trach conversation premature / On 4/24 and 4/25 - daughter updated  Disposition:  ICU.        ATTESTATION & SIGNATURE   The patient Krystal Little is critically ill with multiple organ systems failure and requires high complexity decision making for assessment and support, frequent evaluation and titration of therapies, application of advanced monitoring technologies and extensive interpretation of multiple databases.   Critical Care Time devoted to patient care services described in this note is  31  Minutes. This time reflects time of care of this signee Dr Brand Males. This critical care time does not reflect procedure time, or teaching time or supervisory time of PA/NP/Med student/Med Resident etc but could involve care discussion time     Dr. Brand Males, M.D., [facility].C.P Pulmonary and Critical Care Medicine Staff  Physician [facility] Pager: [phone], If no answer or between  15:00h - 7:00h: call [phone]  11/09/2019 11:35 AM        LABS    PULMONARY Recent Labs  Lab 11/02/19 1202  PHART 7.375  PCO2ART 39.1  PO2ART 286.0*  HCO3 22.9  TCO2 24  O2SAT 100.0    CBC Recent Labs  Lab 11/07/19 0605 11/08/19 0550 11/09/19 0341  HGB 10.4* 9.7* 10.3*  HCT 32.0* 30.3* 32.5*  WBC 14.0* 12.5* 14.5*  PLT 110* 119* 157    COAGULATION Recent Labs  Lab 11/03/19 0408  INR 1.3*    CARDIAC  No results for input(s): TROPONINI in the last 168 hours. No results for input(s): PROBNP in the last 168 hours.   CHEMISTRY Recent Labs  Lab 11/05/19 0324 11/05/19 1219 11/06/19 0400 11/06/19 0400 11/06/19 1210 11/07/19 0122 11/07/19 0605 11/07/19 0605 11/07/19 1303 11/08/19 0550 11/09/19 0341  NA 155*   < > 153*  --    < > 150* 150*  --  148* 148* 149*  K 3.9  --  3.3*   < >  --   --  3.2*   < >  --  3.3* 3.6  CL 127*  --  123*  --   --   --  109  --   --  106 105  CO2 24  --  24  --   --   --  31  --   --  33* 33*  GLUCOSE 320*  --  271*  --   --   --  166*  --   --  201* 175*  BUN 28*  --  16  --   --   --  14  --   --  18 22  CREATININE 0.95  --  0.65  --   --   --  0.53  --   --  0.61 0.80  CALCIUM 8.4*  --  7.9*  --   --   --  8.4*  --   --  8.4* 8.6*  MG 2.2  --  2.2  --   --   --  2.4  --   --  2.5* 2.5*  PHOS  --   --  2.0*  --   --   --  2.7  --   --  2.7 3.7   < > = values in this interval not displayed.   Estimated Creatinine Clearance: 66 mL/min (by C-G formula based on SCr of 0.8 mg/dL).   LIVER Recent Labs  Lab 11/03/19 0408  INR 1.3*     INFECTIOUS Recent Labs  Lab 11/06/19 0000 11/06/19 0400 11/07/19 0122 11/07/19 0605 11/09/19 0041  LATICACIDVEN 1.7  --  1.4  --  1.4  PROCALCITON  --  0.88  --  0.63  --      ENDOCRINE CBG (last 3)  Recent Labs    11/08/19 2317 11/09/19 0314  11/09/19 0715  GLUCAP 146* 174* 145*         IMAGING x48h  - image(s) personally visualized  -   highlighted in bold CT HEAD WO CONTRAST  Result Date: 11/09/2019 CLINICAL DATA:  Follow-up examination for acute stroke. EXAM: CT HEAD WITHOUT CONTRAST TECHNIQUE: Contiguous axial images were obtained from the base of the skull through the vertex without intravenous contrast. COMPARISON:  Prior head CT from 11/06/2019. FINDINGS: Brain: There has been continued interval evolution of large right MCA territory infarct, stable in size and distribution as compared to previous. Associated edema is more hypodense as compared to previous, although relatively similar in size and morphology. Associated hemorrhagic transformation with hemorrhage involving the right caudate and lentiform nuclei is not significantly changed. Additional scattered hemorrhage throughout the infarcted territory also little interval changed. Blood again noted along the right sylvian fissure. Prior right craniectomy with  similar swelling of the brain through the craniectomy defect. Persistent right-to-left midline shift measures up to 12 mm, little interval change from previous. Asymmetric dilatation of the left lateral ventricle is stable. Similar basilar cistern crowding. No new findings seen within the left cerebral hemisphere, brainstem, or cerebellum. Vascular: No new hyperdense vessel. Scattered vascular calcifications noted within the carotid siphons. Skull: Right craniectomy without adverse features. Skin staples remain in place. Sinuses/Orbits: Globes and orbital soft tissues within normal limits.Paranasal sinuses remain largely clear. No mastoid effusion. Other: None. IMPRESSION: 1. Continued interval evolution of large right MCA territory infarct with associated hemorrhagic transformation, relatively stable as compared to 11/06/2019. No evidence for interval hemorrhage or re-bleeding. Associated mass effect with up to 12 mm of  right-to-left shift not significantly changed. Stable asymmetric dilatation of the left lateral ventricle. 2. Right craniectomy without adverse features. Electronically Signed   By: Jeannine Boga M.D.   On: 11/09/2019 06:15   DG CHEST PORT 1 VIEW  Result Date: 11/08/2019 CLINICAL DATA:  ETT EXAM: PORTABLE CHEST 1 VIEW COMPARISON:  November 07, 2019 FINDINGS: Stable left PICC line. No pneumothorax. The ETT is in good position. The NG tube terminates below today's film. The pacemaker stable. Bilateral pleural effusions, right greater than left, with underlying opacity, likely atelectasis. No overt edema. No other abnormalities or changes. IMPRESSION: 1. Support apparatus as above. 2. Small bilateral pleural effusions with underlying opacities, likely atelectasis. No other changes. Electronically Signed   By: Dorise Bullion III M.D   On: 11/08/2019 12:00

## 2019-11-10 DIAGNOSIS — J9601 Acute respiratory failure with hypoxia: Secondary | ICD-10-CM | POA: Diagnosis not present

## 2019-11-10 DIAGNOSIS — I63411 Cerebral infarction due to embolism of right middle cerebral artery: Secondary | ICD-10-CM | POA: Diagnosis not present

## 2019-11-10 LAB — CBC
HCT: 32.3 % — ABNORMAL LOW (ref 36.0–46.0)
Hemoglobin: 10.1 g/dL — ABNORMAL LOW (ref 12.0–15.0)
MCH: 30.2 pg (ref 26.0–34.0)
MCHC: 31.3 g/dL (ref 30.0–36.0)
MCV: 96.7 fL (ref 80.0–100.0)
Platelets: 215 10*3/uL (ref 150–400)
RBC: 3.34 MIL/uL — ABNORMAL LOW (ref 3.87–5.11)
RDW: 14.6 % (ref 11.5–15.5)
WBC: 16.2 10*3/uL — ABNORMAL HIGH (ref 4.0–10.5)
nRBC: 0.1 % (ref 0.0–0.2)

## 2019-11-10 LAB — GLUCOSE, CAPILLARY
Glucose-Capillary: 153 mg/dL — ABNORMAL HIGH (ref 70–99)
Glucose-Capillary: 188 mg/dL — ABNORMAL HIGH (ref 70–99)
Glucose-Capillary: 199 mg/dL — ABNORMAL HIGH (ref 70–99)
Glucose-Capillary: 217 mg/dL — ABNORMAL HIGH (ref 70–99)
Glucose-Capillary: 219 mg/dL — ABNORMAL HIGH (ref 70–99)
Glucose-Capillary: 286 mg/dL — ABNORMAL HIGH (ref 70–99)

## 2019-11-10 LAB — BASIC METABOLIC PANEL
Anion gap: 11 (ref 5–15)
BUN: 30 mg/dL — ABNORMAL HIGH (ref 8–23)
CO2: 32 mmol/L (ref 22–32)
Calcium: 8.4 mg/dL — ABNORMAL LOW (ref 8.9–10.3)
Chloride: 104 mmol/L (ref 98–111)
Creatinine, Ser: 0.94 mg/dL (ref 0.44–1.00)
GFR calc Af Amer: >60 mL/min (ref >60)
GFR calc non Af Amer: >60 mL/min (ref >60)
Glucose, Bld: 257 mg/dL — ABNORMAL HIGH (ref 70–99)
Potassium: 3.7 mmol/L (ref 3.5–5.1)
Sodium: 147 mmol/L — ABNORMAL HIGH (ref 135–145)

## 2019-11-10 MED ORDER — POTASSIUM CHLORIDE 20 MEQ/15ML (10%) PO SOLN
30.0000 meq | Freq: Once | ORAL | Status: AC
  Administered 2019-11-10: 30 meq
  Filled 2019-11-10: qty 30

## 2019-11-10 MED ORDER — SODIUM CHLORIDE 0.9 % IV SOLN
2.0000 g | Freq: Two times a day (BID) | INTRAVENOUS | Status: DC
  Filled 2019-11-10: qty 2

## 2019-11-10 MED ORDER — INSULIN ASPART 100 UNIT/ML ~~LOC~~ SOLN
12.0000 [IU] | SUBCUTANEOUS | Status: DC
  Administered 2019-11-10 – 2019-11-15 (×30): 12 [IU] via SUBCUTANEOUS

## 2019-11-10 MED ORDER — JEVITY 1.2 CAL PO LIQD
1000.0000 mL | ORAL | Status: DC
  Administered 2019-11-10 – 2019-11-13 (×3): 1000 mL
  Filled 2019-11-10 (×8): qty 1000

## 2019-11-10 MED ORDER — INSULIN ASPART 100 UNIT/ML ~~LOC~~ SOLN
0.0000 [IU] | SUBCUTANEOUS | Status: DC
  Administered 2019-11-10: 4 [IU] via SUBCUTANEOUS
  Administered 2019-11-10: 7 [IU] via SUBCUTANEOUS
  Administered 2019-11-10: 4 [IU] via SUBCUTANEOUS
  Administered 2019-11-11: 3 [IU] via SUBCUTANEOUS
  Administered 2019-11-11: 4 [IU] via SUBCUTANEOUS
  Administered 2019-11-11: 3 [IU] via SUBCUTANEOUS
  Administered 2019-11-11: 4 [IU] via SUBCUTANEOUS
  Administered 2019-11-11 – 2019-11-12 (×6): 3 [IU] via SUBCUTANEOUS
  Administered 2019-11-13 (×4): 4 [IU] via SUBCUTANEOUS
  Administered 2019-11-13: 3 [IU] via SUBCUTANEOUS
  Administered 2019-11-14: 4 [IU] via SUBCUTANEOUS
  Administered 2019-11-14 – 2019-11-15 (×5): 3 [IU] via SUBCUTANEOUS
  Administered 2019-11-15: 4 [IU] via SUBCUTANEOUS

## 2019-11-10 NOTE — Progress Notes (Addendum)
Occupational Therapy Treatment Patient Details Name: Krystal Little MRN: [id number] DOB: 08-09-1955 Today's Date: 11/10/2019    History of present illness 64 y.o. female presented to [facility] ED as a code stroke for  Right gaze deviation and left side weakness. Pt s/p R hemicraniectomy with bone flap placed in abdomen on 4/18.  PMH HTN, DM2, chronic a. Fib (coumadin) s/p pace maker.   OT comments  Upon arrival, pt supine in bed and easy to arouse. Pt continues to present with motivation to participate in therapy despite fatigue. Pt continuing to present with difficulty opening eyes and communicating verbally; pt able to perform "thumbs up or down" for answering yes/no questions. Pt following simple commands. Pt requiring Max A +2 for bed mobility and to maintain sitting balance at EOB. Pt participating in cervical rotation/stretches, visual tracking task, RUE AAROM/AROM, and reaching task with trunk rotation. SpO2 stable on RA throughout session; RN notified. Continue to recommend dc to CIR and will continue to follow acutely as admitted.    Follow Up Recommendations  CIR;Supervision/Assistance - 24 hour    Equipment Recommendations  Other (comment)(Defer to next venue)    Recommendations for Other Services PT consult;Rehab consult;Speech consult    Precautions / Restrictions Precautions Precautions: Fall Precaution Comments: R hemicraniectomy Restrictions Weight Bearing Restrictions: No       Mobility Bed Mobility Overal bed mobility: Needs Assistance Bed Mobility: Supine to Sit;Sit to Supine Rolling: Max assist;+2 for physical assistance Sidelying to sit: Max assist;+2 for physical assistance Supine to sit: Max assist;+2 for physical assistance Sit to supine: Max assist;+2 for physical assistance   General bed mobility comments: pt did attempt to move R LE off EOB, continue to require maxAX2 for trunk elevation  Transfers Overall transfer level: Needs assistance Equipment used:  2 person hand held assist(with bed pad under bottom) Transfers: Sit to/from Stand Sit to Stand: Total assist;+2 physical assistance         General transfer comment: L knee blocked, max tactile cues at PSIS and anterior shoulder to achieve full upright standing, pt tolerated x 30 sec, pt did attempt to hold head up    Balance Overall balance assessment: Needs assistance Sitting-balance support: Single extremity supported;Feet supported Sitting balance-Leahy Scale: Poor Sitting balance - Comments: pt attempted to hold head up and maintain back extension however overall continues ot require maxA to maintain mid line posture. worked on dynamic reaching to the L, scapular mobility and anterior pelvic tilt. Postural control: Left lateral lean Standing balance support: Single extremity supported Standing balance-Leahy Scale: Zero Standing balance comment: dependent on physical assist                           ADL either performed or assessed with clinical judgement   ADL Overall ADL's : Needs assistance/impaired                                       General ADL Comments: Focused session on sitting balance at EOB, stretching, and exercises.     Vision   Vision Assessment?: Vision impaired- to be further tested in functional context Additional Comments: Able to track from right to midline. Participating in tracking task to move from R visual field and then transition to midline; 3 reps   Perception     Praxis      Cognition Arousal/Alertness: Awake/alert Behavior During  Therapy: Flat affect Overall Cognitive Status: Difficult to assess Area of Impairment: Following commands;Problem solving                       Following Commands: Follows one step commands with increased time;Follows one step commands consistently;Follows multi-step commands inconsistently     Problem Solving: Slow processing;Decreased initiation;Difficulty  sequencing;Requires verbal cues;Requires tactile cues General Comments: Difficult to assess as pt continues to not be able to keep eyes open and verablize. Pt following simple commands and using thumbs up and down to answer yes/no questions. Pt answering questions about her name and her location. Following cues for exercises and visual tracking task. Will continue to assess        Exercises Exercises: General Upper Extremity;General Lower Extremity;Other exercises General Exercises - Upper Extremity Shoulder Flexion: Right;AAROM;5 reps;PROM;Left Elbow Flexion: AAROM;Right;5 reps;PROM;Left Other Exercises Other Exercises: cervical rotation to left to look at spouse, rotates back to midline from left initially Other Exercises: Trunk rotation with reaching of RUE towards red flash light in left visual field. PRovdiing Max A for trunk rotation and assistance to maintain eyes open. Hand over hand for reach towards flash light. 5 reps   Shoulder Instructions       General Comments Daughter present throughout. VSS on RA    Pertinent Vitals/ Pain       Pain Assessment: No/denies pain Faces Pain Scale: No hurt Pain Intervention(s): Other (comment)(Pt denies pain)  Home Living                                          Prior Functioning/Environment              Frequency  Min 2X/week        Progress Toward Goals  OT Goals(current goals can now be found in the care plan section)  Progress towards OT goals: Progressing toward goals  Acute Rehab OT Goals Patient Stated Goal: Get better; per daughter OT Goal Formulation: With family Time For Goal Achievement: 11/17/19 Potential to Achieve Goals: Good ADL Goals Pt Will Perform Grooming: with mod assist;sitting Pt Will Transfer to Toilet: with mod assist;stand pivot transfer;bedside commode Pt Will Perform Toileting - Clothing Manipulation and hygiene: with mod assist;sit to/from stand;sitting/lateral  leans Additional ADL Goal #1: Pt will perform bed mobility with Min A +2 in preparation for ADLs Additional ADL Goal #2: Pt will tolerate sitting at EOB for 10 minute with Min A in preparation for ADLs Additional ADL Goal #3: Pt will follow one step commands with Mod cues during ADLs  Plan Discharge plan remains appropriate    Co-evaluation    PT/OT/SLP Co-Evaluation/Treatment: Yes Reason for Co-Treatment: For patient/therapist safety;To address functional/ADL transfers;Complexity of the patient's impairments (multi-system involvement)   OT goals addressed during session: ADL's and self-care      AM-PAC OT "6 Clicks" Daily Activity     Outcome Measure   Help from another person eating meals?: Total Help from another person taking care of personal grooming?: Total Help from another person toileting, which includes using toliet, bedpan, or urinal?: Total Help from another person bathing (including washing, rinsing, drying)?: Total Help from another person to put on and taking off regular upper body clothing?: Total Help from another person to put on and taking off regular lower body clothing?: Total 6 Click Score: 6    End of Session  OT Visit Diagnosis: Unsteadiness on feet (R26.81);Other abnormalities of gait and mobility (R26.89);Muscle weakness (generalized) (M62.81)   Activity Tolerance Patient limited by fatigue   Patient Left in bed;with call bell/phone within reach;with nursing/sitter in room;with bed alarm set;with restraints reapplied   Nurse Communication Mobility status        Time: 2076-1915 OT Time Calculation (min): 28 min  Charges: OT General Charges $OT Visit: 1 Visit OT Treatments $Therapeutic Activity: 8-22 mins  [place], OTR/L Acute Rehab Pager: [phone] Office: [place] 11/10/2019, 1:21 PM

## 2019-11-10 NOTE — Progress Notes (Signed)
Inpatient Diabetes Program Recommendations  AACE/ADA: New Consensus Statement on Inpatient Glycemic Control (2015)  Target Ranges:  Prepandial:   less than 140 mg/dL      Peak postprandial:   less than 180 mg/dL (1-2 hours)      Critically ill patients:  140 - 180 mg/dL   Lab Results  Component Value Date   GLUCAP 286 (H) 11/10/2019   HGBA1C 9.7 (H) 11/01/2019    Review of Glycemic Control Results for MALEIAH, DULA (MRN [id number]) as of 11/10/2019 10:20  Ref. Range 11/09/2019 15:11 11/09/2019 19:51 11/09/2019 23:37 11/10/2019 03:27 11/10/2019 07:56  Glucose-Capillary Latest Ref Range: 70 - 99 mg/dL 111 (H) 245 (H) 264 (H) 199 (H) 286 (H)   Inpatient Diabetes Program Recommendations:  With increase in tube feed coverage to 12 units, -Decrease Novolog correction to sensitive Secure chat sent to Dr. Leonie Man.  Thank you, Nani Gasser. Mailynn Everly, RN, MSN, CDE  Diabetes Coordinator Inpatient Glycemic Control Team Team Pager [phone] (8am-5pm) 11/10/2019 10:26 AM

## 2019-11-10 NOTE — Progress Notes (Signed)
Nutrition Follow-up  DOCUMENTATION CODES:   Not applicable  INTERVENTION:   D/C Osmolite 1.2  Jevity 1.2 @ 60 ml/hr via NG/Cortrak tube  Provides: 1728 kcal, 80 grams protein, and 1167 ml free water.    NUTRITION DIAGNOSIS:   Inadequate oral intake related to inability to eat as evidenced by NPO status. Ongoing.   GOAL:   Patient will meet greater than or equal to 90% of their needs Meeting with TF  MONITOR:   TF tolerance, Diet advancement, Labs  REASON FOR ASSESSMENT:   Consult, Ventilator Enteral/tube feeding initiation and management  ASSESSMENT:   Pt with PMH of DM, chronic AF with pacemaker found down with R MCA stroke s/p IR thrombectomy.   4/18 s/p emergent decompressive hemicraniectomy due to vasogenic cerebral edema  4/25 extubated   Pt discussed during ICU rounds and with RN.  Recommend change NG to Cortrak tube to assist with swallow function.   Medications reviewed and include: SSI, 12 units novolog every 4 hours, 25 units lantus BID, miralax, senokot-s  Labs reviewed: Na 147 (H) CBG's: [phone] NG tube in place; tip in stomach    TF: Osmolite 1.2 @ 60 ml/hr Provides: 1728 kcal and 80 grams protein  Diet Order:   Diet Order            Diet NPO time specified  Diet effective now              EDUCATION NEEDS:   No education needs have been identified at this time  Skin:  Skin Assessment: (venous stasis ulcer)  Last BM:  4/24  Height:   Ht Readings from Last 1 Encounters:  10/31/19 5\' 4"  (1.626 m)    Weight:   Wt Readings from Last 1 Encounters:  11/10/19 60.3 kg    Ideal Body Weight:  54.5 kg  BMI:  Body mass index is 22.82 kg/m.  Estimated Nutritional Needs:   Kcal:  1500-1700  Protein:  76-85 grams  Fluid:  >1.6 L/day  Lockie Pares., RD, LDN, CNSC See AMiON for contact information

## 2019-11-10 NOTE — Op Note (Signed)
NEUROSURGERY OPERATIVE NOTE   PREOP DIAGNOSIS:  1. Intracranial Hypertension 2. Right MCA stroke   POSTOP DIAGNOSIS: Same  PROCEDURE: 1. Decompressive right hemicraniectomy 2. Placement of bone flap into abdominal subcutaneous pocket  SURGEON: Dr. Consuella Lose, MD  ASSISTANT: Vicnent Costella, PA-C  ANESTHESIA: General Endotracheal  EBL: 100cc  SPECIMENS: None  DRAINS: None  COMPLICATIONS: None immediate  CONDITION: Hemodynamically stable to ICU  HISTORY: Krystal Little is a 64 y.o. female initially presenting to the hospital with left-sided neglect and left hemiparesis.  Work-up included CT angiogram and MRI demonstrating a right MCA territory stroke.  She did undergo mechanical thrombectomy.  Unfortunately, she was noted to have progressive worsening edema involving the right middle cerebral artery territory causing significant radiologic mass-effect.  Her clinical exam deteriorated to the point where she was very somnolent indicating progressive brainstem compression.  After discussion with the primary neurology service and the patient's family, we elected to proceed with decompressive hemicraniectomy.  The risks and benefits of the operation as well as the expected postoperative course was discussed with the patient's family.  After all their questions were answered, informed consent was obtained and witnessed.  PROCEDURE IN DETAIL: The patient was brought to the operating room. After induction of general anesthesia, the patient was positioned on the operative table in the supine position. All pressure points were meticulously padded. Skin incision was then marked out and prepped and draped in the usual sterile fashion.  After timeout was conducted, a standard right sided reverse?  Hemicraniectomy incision was marked out.  This was infiltrated with local anesthetic with epinephrine.  Incision was then made sharply and carried down through the galea.  Raney clips were  applied.  A single piece myocutaneous flap was then elevated after the temporalis muscle was incised and elevated.  Multiple bur holes were then created and connected with the craniotome to elevate a single piece large frontotemporoparietal craniotomy flap.  The underlying dura was noted to be quite tense.  Hemostasis on the epidural plane was achieved with a combination of bipolar electrocautery and morselized Gelfoam with thrombin.  The dura was then opened in stellate fashion.  The underlying brain was noted to be under significant amount of pressure and slowly began to herniate through the large dural and bony defect.  The frontotemporoparietal regions did appear to be quite dusky and gray in color.  As the brain partially herniated through the craniectomy defect, a small opening in the temporal region spontaneously occurred, and a small amount of hematoma was expressed.  I therefore did evacuate any easily visible hematoma from the temporal lobe.  No active bleeding was identified.  At this point a large piece of collagen onlay graft was placed over the brain surface.  Hemostasis was again confirmed.  The temporalis muscle was then closed with interrupted 0 Vicryl stitches.  The galea was closed with interrupted 0 Vicryl stitches, and the skin was closed with staples.  Bacitracin ointment and sterile dressing was applied.  A right lower quadrant linear incision was then infiltrated with local anesthetic with epinephrine.  Incision was made sharply and carried down through the subcutaneous tissue.  A subcutaneous pocket was then created.  Good hemostasis was achieved.  The large bone flap was then placed in the subcutaneous pocket.  The wound was then irrigated with copious amounts of normal saline irrigation.  This wound was then closed with a combination of interrupted 0 and 3-0 Vicryl stitches.  Skin was closed with staples.  Bacitracin ointment and sterile dressing was applied.  At the end of the  case all sponge, needle, and instrument counts were correct. The patient was then transferred to the stretcher and taken to the intensive care unit in stable hemodynamic condition.

## 2019-11-10 NOTE — Progress Notes (Signed)
STROKE TEAM PROGRESS NOTE   INTERVAL HISTORY Patient was extubated yesterday.  She is sitting up in bed.  Her daughter is at the bedside.  Speech therapist is doing bedside swallow eval.  CT scan of the head done yesterday showed stable 12 mm shift.  White count today is slightly elevated at 16.2.  Serum sodium is 147.  Vital signs are stable.  OBJECTIVE Vitals:   11/10/19 0600 11/10/19 0700 11/10/19 0800 11/10/19 0900  BP: (!) 116/48 (!) 120/52 (!) 140/56 123/69  Pulse: 60 60 62 99  Resp: (!) 24 (!) 28 20 (!) 28  Temp:   99.4 F (37.4 C)   TempSrc:   Axillary   SpO2: 99% 99% 97% 99%  Weight:      Height:        CBC:  Recent Labs  Lab 11/09/19 0341 11/09/19 0341 11/09/19 1711 11/10/19 0457  WBC 14.5*  --   --  16.2*  HGB 10.3*   < > 13.9 10.1*  HCT 32.5*   < > 41.0 32.3*  MCV 97.6  --   --  96.7  PLT 157  --   --  215   < > = values in this interval not displayed.   Basic Metabolic Panel:  Recent Labs  Lab 11/08/19 0550 11/08/19 0550 11/09/19 0341 11/09/19 0341 11/09/19 1711 11/10/19 0457  NA 148*   < > 149*   < > 149* 147*  K 3.3*   < > 3.6   < > 3.3* 3.7  CL 106   < > 105  --   --  104  CO2 33*   < > 33*  --   --  32  GLUCOSE 201*   < > 175*  --   --  257*  BUN 18   < > 22  --   --  30*  CREATININE 0.61   < > 0.80  --   --  0.94  CALCIUM 8.4*   < > 8.6*  --   --  8.4*  MG 2.5*  --  2.5*  --   --   --   PHOS 2.7  --  3.7  --   --   --    < > = values in this interval not displayed.   IMAGING past 24h  DG Abd 1 View  Result Date: 11/09/2019 CLINICAL DATA:  Evaluate NG tube EXAM: ABDOMEN - 1 VIEW COMPARISON:  None. FINDINGS: The NG tube terminates in the stomach in good position. Postoperative changes are seen over the right lower quadrant. IMPRESSION: The NG tube terminates in the stomach, in good position. Electronically Signed   By: Dorise Bullion III M.D   On: 11/09/2019 17:23   DG CHEST PORT 1 VIEW  Result Date: 11/09/2019 CLINICAL DATA:  S/p  extubation. EXAM: PORTABLE CHEST 1 VIEW COMPARISON:  11/09/2019 FINDINGS: An endotracheal tube and NG tube have been removed. A RIGHT pleural effusion, RIGHT basilar atelectasis, and LEFT LOWER lung consolidation/atelectasis again noted. Median sternotomy, RIGHT-sided pacemaker and LEFT PICC line again identified. IMPRESSION: Endotracheal tube and NG tube removal without other significant change. Electronically Signed   By: Margarette Canada M.D.   On: 11/09/2019 17:02     PHYSICAL EXAM     Temp:  [98.5 F (36.9 C)-100.4 F (38 C)] 99.4 F (37.4 C) (04/26 0800) Pulse Rate:  [60-119] 99 (04/26 0900) Resp:  [20-30] 28 (04/26 0900) BP: (92-140)/(46-81) 123/69 (04/26 0900) SpO2:  [87 %-100 %]  99 % (04/26 0900) FiO2 (%):  [40 %] 40 % (04/25 1135) Weight:  [60.3 kg] 60.3 kg (04/26 0500)  Middle-aged African-American Krystal Little not in distress.  Sitting up in bed. . Afebrile. Head is nontraumatic. Neck is supple without bruit.    Cardiac exam irregular heart sounds no murmur or gallop. Lungs are clear to auscultation. Distal pulses are well felt.   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  Follows commands well on the right side but has left hemiplegia with eye forced opening, eyes still in right gaze position, not blinking to visual threat on left, doll's eyes +, bilateral pupil 54mm, sluggish to light. Corneal reflex present, ..  Tongue protrusion not cooperative. However, she was able to follow commands on the right hand and foot. On pain stimulation, RUE and RLE mild withdraw 2/5, LUE extension posturing and LLE slight withdraw. DTR 1+ and bilateral upgoing toes. Sensation, coordination and gait not tested.   ASSESSMENT/PLAN Krystal Little is a 64 y.o. female with history of HTN, DM2, hx of mitral valve repair, chronic a. Fib (coumadin - INR 1.2 on admission) s/p pacemaker who presented to [facility] ED as a code stroke for right gaze  deviation, urinary incontinence and left sided weakness. She did not receive IV t-PA due to anticoagulation with coumadin. IR -> ICA terminus occlusion. Mechanical thrombectomy performed with 2 stent retriever + aspiration passes in the MCA-ICA (solitaire and embotrap), 1 stent retriever + aspiration pass MCA (embotrap) and 1 direct contact aspiration in the right ACA. Complete recanalization achieved (TICI3).  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 Hyperdense right MCA suggesting extensive thrombus from the right ICA terminus through the MCA bifurcation Some mass effect with partial effacement the sulci on the right. No midline shift. ASPECTS is 4/10   CTA H&N - Total occlusion of right ICA terminus. Thrombus extends through the right MCA bifurcation with poor collaterals.   CT Perfusion - Confirmed large right MCA territory infarct with core volume of 107 mL. Large mismatch volume of 9.6 with a mismatch ratio of 1.9.   CT head -  Large area of hypoattenuation within the right MCA territory, consistent with subacute infarct. Multifocal hyperdensity within the right MCA territory is consistent with hemorrhage or contrast staining. The more peripheral hyperdensities are favored to be hemorrhagic. 6 mm leftward midline shift  CT Head 11/02/19 - Worsened edema within the right MCA territory with increased size of right basal ganglia intraparenchymal hematoma. Increased amount of subarachnoid blood over the right hemisphere. 11 mm of leftward midline shift  MRI head - not performed - PPM   CT head 4/20 postop changed from R crani. Evolution large R MCA infarct w/ interval increase edema/swelling. Increased petechial hemorrhage throughout. SAH also increased. R BG HT slightly increased. Mass  effect, partial effacement ventricles w/ 61mm L midline shift   CT 4/22 stable large R MCA infarct w/ diffuse petechial hemorrhage   CT Head 11/09/19 - Continued interval evolution of large right MCA territory infarct with associated hemorrhagic transformation, relatively stable as compared to 11/06/2019. No evidence for interval hemorrhage or re-bleeding. Associated mass effect with up to 12 mm of right-to-left shift not significantly changed. Stable asymmetric dilatation of the left lateral ventricle.  2D Echo - EF 50-55%. No source of embolus. 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   Sars Corona Virus 2 - negative  LDL - 158  HgbA1c - 9.7  UDS - negative  VTE prophylaxis - SCDs  warfarin daily prior to admission, now on No antithrombotic  Therapy recommendations:  CIR - admission coordinator following  Disposition:  Pending  Acute hypoxemia respiratory failure d/t stroke  Intubated for emergent hemicraniectomy for cerebral edema  CCM on board  On fentanyl->prn sedation  CXR 4/20 - Complete opacification of the retrocardiac left lung base worrisome for left lower lobe atelectasis.  CXR 4/22 B pulm infiltrates / edema and B pleural effusions  CXR 4/23 - Bilateral pleural effusions, right greater than left, with associated opacity, likely atelectasis.  CXR 11/09/19 - Stable small right effusion with underlying atelectasis. Stableeft retrocardiac opacity. The small layering left effusion seen previously is less prominent.  Lasix 40 and d/c IVF  Cerebral edema w/ subfalcine herniation s/p decompressive hemicraniectomy  CT 4/22 stable large R MCA infarct w/ diffuse petechial hemorrhage  3% saline @ 50 cc/hr -> NS @ 50 -> off  Na 138->...-151-150-150->148->148  NSG on board s/p [facility] 4/18  PICC line placed  23.4% x 2  Now 7 days out, will allow Na to gradually trend down.  Atrial fibrillation w/ RVR  Telemetry showed A. fib, rate controlled  On Coumadin  PTA, family stated compliance  Subtherapeutic INR 1.2 on presentation -> 1.4  No AC at the time due to large infarct and hemorrhagic conversion (previously refused DOACs)  metoprolol 50 bid    On cardizem 60 q 8h->60 q 6h  amiodarone gtt converted to po 4/22 - 400 bid x 1 week then 400 daily  EP Cardiology on board  Hypertension->Hypotension  Home BP meds: Atenolol; Cardizem ; lisinopril  Current BP meds: cardizem, metoprolol 50 bid  Off cleviprex  lisinopril d/c due to low BP  BP improved  Resume metoprolol 25 bid -> 50 bid . SBP goal < 160 mm Hg . Long-term BP goal normotensive  Hyperlipidemia  Home Lipid lowering medication: zetia  LDL 158, goal < 70  Hold off statin for know due to left hemorrhagic conversion  Consider statin at discharge  Diabetes, uncontrolled  Home diabetic meds: metformin  HgbA1c 9.7, goal < 7.0  CBG monitoring  Hyperglycemia - on lantus 25 bid, novolog 7u q4h     Continue SSI 0-20  DM coordinator consulted  Anemia and thrombocytopenia, improving  Platelet 157    Hb 10.3    Close monitoring  Dysphagia  NPO  Has NG  On TF @ 40  Speech following  Other Stroke Risk Factors  Advanced age  ETOH use, advised to drink no more than 1 alcoholic beverage per day.  Other Active Problems  Code status - Full code  SSS with pacemaker, not compatible with MRI  Hypokalemia - 3.6     Hypophosphatemia 2.7    - supplement   Leukocytosis WBC 4.5    Hypermagnesemia - 2.5     Temperature - 100.4 (UA still pending from 4/23 (will ask nurses to check) ; Blood cultures no growth from 4/23 ; CXR today not c/w pneumonia ; tracheal aspirate 4/23 - normal))  Continue mobilization out of bed.  Ongoing therapy consults.  Speech therapy for swallow eval.  Continue cefepime for presumed sepsis though blood cultures and chest x-ray appear unremarkable.  Increase insulin coverage sliding scale.  Long discussion of the bedside  with  the patient's daughter about her prognosis plan of care and answered questions.  Discussed with speech therapist. This patient is critically ill and at significant risk of neurological worsening, death and care requires constant monitoring of vital signs, hemodynamics,respiratory and cardiac monitoring, extensive review of multiple databases, frequent neurological assessment, discussion with family, other specialists and medical decision making of high complexity.I have made any additions or clarifications directly to the above note.This critical care time does not reflect procedure time, or teaching time or supervisory time of PA/NP/Med Resident etc but could involve care discussion time.  I spent 30 minutes of neurocritical care time  in the care of  this patient.     Hospital day # [place], MD  To contact Stroke Continuity provider, please refer to [email or web address] After hours, contact General Neurology

## 2019-11-10 NOTE — Progress Notes (Signed)
Pharmacy Antibiotic Note  Krystal Little is a 64 y.o. 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  Pharmacy has been consulted for cefepime dosing for sepsis. Patient has been receiving cefepime 2g IV q8h. Today, Scr increased to 0.94 from 0.80 with current CrCl of 51 ml/min. WBC 16.2. Tmax 100.4. Blood cultures are no growth to date and tracheal aspirate consistent with normal flora.   Plan: Adjust cefepime to 2g IV q12h  Monitor renal function, cultures/sensitivites, length of therapy, and clinical progression   Height: 5\' 4"  (162.6 cm) Weight: 60.3 kg (132 lb 15 oz) IBW/kg (Calculated) : 54.7  Temp (24hrs), Avg:99.7 F (37.6 C), Min:98.5 F (36.9 C), Max:100.4 F (38 C)  Recent Labs  Lab 11/05/19 0324 11/06/19 0000 11/06/19 0400 11/07/19 0122 11/07/19 0605 11/08/19 0550 11/09/19 0041 11/09/19 0341 11/10/19 0457  WBC   < >  --  8.7  --  14.0* 12.5*  --  14.5* 16.2*  CREATININE   < >  --  0.65  --  0.53 0.61  --  0.80 0.94  LATICACIDVEN  --  1.7  --  1.4  --   --  1.4  --   --    < > = values in this interval not displayed.    Estimated Creatinine Clearance: 51.5 mL/min (by C-G formula based on SCr of 0.94 mg/dL).    No Known Allergies  Antimicrobials this admission: Cefepime 4/24 >>   Dose adjustments this admission: 4/26: Adjust cefepime to 2g IV q12h   Microbiology results: 4/13 COVID neg 4/17 MRSA PCR neg 7/23 TA: normal flora  4/23 BCx: ngtd   Cristela Felt, PharmD PGY1 Pharmacy Resident [employer]: [phone]  11/10/2019 7:05 AM

## 2019-11-10 NOTE — Progress Notes (Signed)
Physical Therapy Treatment Patient Details Name: Krystal Little MRN: [id number] DOB: [birth year] Today's Date: 11/10/2019    History of Present Illness 64 y.o. female presented to [facility] ED as a code stroke for  Right gaze deviation and left side weakness. Pt s/p R hemicraniectomy with bone flap placed in abdomen on 4/18.  PMH HTN, DM2, chronic a. Fib (coumadin) s/p pace maker.    PT Comments    Pt more alert today despite not being able to open eyes. Pt following commands consistently and initiating movement for transfers. Pt remains to demo L LE flaccidity and continues to require maxA for transfers and to maintain midline and upright posture. Continue to recommend CIR upon d/c. Acute PT to cont to follow.  Follow Up Recommendations  CIR;Supervision/Assistance - 24 hour     Equipment Recommendations  Hospital bed;Wheelchair (measurements PT);Wheelchair cushion (measurements PT);Other (comment)    Recommendations for Other Services Rehab consult     Precautions / Restrictions Precautions Precautions: Fall Precaution Comments: R hemicraniectomy Restrictions Weight Bearing Restrictions: No    Mobility  Bed Mobility Overal bed mobility: Needs Assistance Bed Mobility: Supine to Sit;Sit to Supine Rolling: Max assist;+2 for physical assistance Sidelying to sit: Max assist;+2 for physical assistance Supine to sit: Max assist;+2 for physical assistance Sit to supine: Max assist;+2 for physical assistance   General bed mobility comments: pt did attempt to move R LE off EOB, continue to require maxAX2 for trunk elevation  Transfers Overall transfer level: Needs assistance Equipment used: 2 person hand held assist(with bed pad under bottom) Transfers: Sit to/from Stand Sit to Stand: Total assist;+2 physical assistance         General transfer comment: L knee blocked, max tactile cues at PSIS and anterior shoulder to achieve full upright standing, pt tolerated x 30 sec, pt did  attempt to hold head up  Ambulation/Gait             General Gait Details: unable at this time   Stairs             Wheelchair Mobility    Modified Rankin (Stroke Patients Only) Modified Rankin (Stroke Patients Only) Pre-Morbid Rankin Score: No symptoms Modified Rankin: Severe disability     Balance Overall balance assessment: Needs assistance Sitting-balance support: Single extremity supported;Feet supported Sitting balance-Leahy Scale: Poor Sitting balance - Comments: pt attempted to hold head up and maintain back extension however overall continues ot require maxA to maintain mid line posture. worked on dynamic reaching to the L, scapular mobility and anterior pelvic tilt.   Standing balance support: Single extremity supported Standing balance-Leahy Scale: Zero Standing balance comment: dependent on physical assist                            Cognition Arousal/Alertness: Awake/alert(pt appears lethargic however pt is unable to open eyes) Behavior During Therapy: Flat affect Overall Cognitive Status: Impaired/Different from baseline Area of Impairment: Following commands;Problem solving                       Following Commands: Follows one step commands with increased time;Follows one step commands consistently;Follows multi-step commands inconsistently     Problem Solving: Slow processing;Decreased initiation;Difficulty sequencing;Requires verbal cues;Requires tactile cues General Comments: pt remains non-verbal but is following commands      Exercises      General Comments General comments (skin integrity, edema, etc.): pt with dressing on R LE, VSS, Pt >  93% on RA      Pertinent Vitals/Pain Pain Assessment: Faces Faces Pain Scale: No hurt Pain Location: pt able to shake head no when asked if she was in pain    Home Living                      Prior Function            PT Goals (current goals can now be found in  the care plan section) Progress towards PT goals: Progressing toward goals    Frequency    Min 3X/week      PT Plan Current plan remains appropriate    Co-evaluation PT/OT/SLP Co-Evaluation/Treatment: Yes Reason for Co-Treatment: For patient/therapist safety PT goals addressed during session: Mobility/safety with mobility        AM-PAC PT "6 Clicks" Mobility   Outcome Measure  Help needed turning from your back to your side while in a flat bed without using bedrails?: Total Help needed moving from lying on your back to sitting on the side of a flat bed without using bedrails?: Total Help needed moving to and from a bed to a chair (including a wheelchair)?: Total Help needed standing up from a chair using your arms (e.g., wheelchair or bedside chair)?: Total Help needed to walk in hospital room?: Total Help needed climbing 3-5 steps with a railing? : Total 6 Click Score: 6    End of Session   Activity Tolerance: Patient tolerated treatment well Patient left: in bed;with call bell/phone within reach;with bed alarm set;with family/visitor present Nurse Communication: Mobility status PT Visit Diagnosis: Difficulty in walking, not elsewhere classified (R26.2)     Time: 7893-8101 PT Time Calculation (min) (ACUTE ONLY): 27 min  Charges:  $Neuromuscular Re-education: 8-22 mins                     [name], PT, DPT Acute Rehabilitation Services Pager #: [phone] Office #: [phone]    [name] 11/10/2019, 10:31 AM

## 2019-11-10 NOTE — Evaluation (Signed)
Clinical/Bedside Swallow Evaluation Patient Details  Name: Krystal Little MRN: [id number] Date of Birth: 1955-12-12  Today's Date: 11/10/2019 Time: SLP Start Time (ACUTE ONLY): 0954 SLP Stop Time (ACUTE ONLY): 1022 SLP Time Calculation (min) (ACUTE ONLY): 28 min  Past Medical History:  Past Medical History:  Diagnosis Date  . Chronic atrial fibrillation ([place]) 1990s   s/p DCCV then attempted ablation of complex A Flutter (@ [facility] - Dr. Deno Etienne), failed antiarrhythmics --> INR folllwed @ [facility] ->> now status post pacemaker placement with underlying A. fib.  . Diabetes mellitus type 2, uncontrolled, without complications    On Oral Medications ([place] [facility])  . Essential hypertension   . History of cardiac catheterization 2001   R&LHC - normal Coronaries, no evidence of Restictive Cardiomyopathy or Constrictive Pericarditis (also @ [facility])  . Hx of sick sinus syndrome 07/1999   Wtih symptomatic bradycardia - syncope (Tachy-Brady)  . 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 CARDIAC CATHETERIZATION  08/03/1999   [place] - normal Coronaries; no sign of constriction or restrictive cardiomypathy  . CRANIECTOMY FOR DEPRESSED SKULL FRACTURE Right 11/02/2019   Procedure: DECOMPRESSIVE HEMI -CRANIECTOMY, IMPLANTATION OF SKULL FLAP TO RIGHT ABDOMEN;  Surgeon: Consuella Lose, MD;  Location: [place];  Service: Neurosurgery;  Laterality: Right;  . IR CT HEAD LTD  10/31/2019  . IR PERCUTANEOUS ART THROMBECTOMY/INFUSION INTRACRANIAL INC DIAG ANGIO  10/31/2019      . IR  PERCUTANEOUS ART THROMBECTOMY/INFUSION INTRACRANIAL INC DIAG ANGIO  10/31/2019  . MITRAL VALVE REPAIR  07/1999   Both Ant& Post leaflet repair - quadrangular resection&caudal transposition, #32 Sequin Annuloplasty ring  . PACEMAKER GENERATOR CHANGE  11/26/2008   Medtronic Adapta L(? if it has been checked since 02/2013)  . PACEMAKER INSERTION  07/1999   for SSS in setting of Chronic Afib; Medtronic Kappa [id number], [id number].  Marland Kitchen RADIOLOGY WITH ANESTHESIA N/A 10/31/2019   Procedure: IR WITH ANESTHESIA;  Surgeon: Radiologist, Medication, MD;  Location: [place];  Service: Radiology;  Laterality: N/A;  . TRANSTHORACIC ECHOCARDIOGRAM  05/2018   Normal LV size and thickness.  EF estimated 50%.  Inferobasal HK and flattened septum c/w with elevated PA pressures. Mild functional mitral stenosis at rest (postoperative).  Moderate left atrial dilation and mild right atrial dilation.  Mean PA pressures estimated 52 mmHg.   HPI:  64 y.o. female presented to [facility] ED as a code stroke for R gaze deviation and L-sided weakness. CT revealed a large R MCA infarct. Pt s/p R hemicraniectomy with bone flap placed in abdomen on 4/18. ETT 4/18-4/25. PMH HTN, DM2, chronic a. Fib (coumadin) s/p pace maker.   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 When provided with stimulation to increase alertness she will follow most one-step commands and respond with brief one to two-word phrases. Her voice is moderately hoarse and with low vocal intensity s/p prolonged intubation. Ice chips were provided with anterior spillage noted on her  L side (CN VII impairment) and mild L-sided buccal pocketing of thin liquids/secretions. Pt had oral holding with initial ice chip, responding better to tactile cue from spoon to initiate posterior transit as opposed to verbal prompting. She had increased  automaticity with her second trial but could not clear her mouth completely. Her vocal quality is wet s/p ice chips and her spontaneous and volitional coughs both appear to be quite weak. Pt quickly started falling back asleep but education was continued with her daughter about likely multifactorial nature of pt's current dysphagia. Would continue with temporary alternative means of nutrition for now, although would consider transitioning from larger bore NGT to Cortrak if able. Will f/u for potential to complete instrumental testing and speech-language evaluation.     SLP Visit Diagnosis: Dysphagia, unspecified (R13.10)    Aspiration Risk  Moderate aspiration risk    Diet Recommendation NPO;Alternative means - temporary   Medication Administration: Via alternative means    Other  Recommendations Oral Care Recommendations: Oral care QID Other Recommendations: Have oral suction available   Follow up Recommendations Inpatient Rehab      Frequency and Duration min 2x/week  2 weeks       Prognosis Prognosis for Safe Diet Advancement: Good      Swallow Study   General HPI: 64 y.o. female presented to [facility] ED as a code stroke for R gaze deviation and L-sided weakness. CT revealed a large R MCA infarct. Pt s/p R hemicraniectomy with bone flap placed in abdomen on 4/18. ETT 4/18-4/25. PMH HTN, DM2, chronic a. Fib (coumadin) s/p pace maker. Type of Study: Bedside Swallow Evaluation Previous Swallow Assessment: none in chart Diet Prior to this Study: NPO;NG Tube Temperature Spikes Noted: Yes(100.4) Respiratory Status: Nasal cannula History of Recent Intubation: Yes Length of Intubations (days): 7 days Date extubated: 11/09/19 Behavior/Cognition: Lethargic/Drowsy;Cooperative;Requires cueing Oral Cavity Assessment: Within Functional Limits Oral Care Completed by SLP: Yes Oral Cavity - Dentition: Adequate natural dentition Vision: Impaired for self-feeding(eyes closed, does not look to L  when eyes openend for her) Self-Feeding Abilities: Total assist Patient Positioning: Upright in bed Baseline Vocal Quality: Hoarse;Low vocal intensity Volitional Cough: Weak Volitional Swallow: (intermittently able to elicit)    Oral/Motor/Sensory Function Overall Oral Motor/Sensory Function: Moderate impairment(mod-severe) Facial ROM: Reduced left;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 Facial Symmetry: Abnormal symmetry left;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 Facial Strength: Reduced left;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 [place] Pager [phone] Office [phone]  11/10/2019,10:27 AM

## 2019-11-10 NOTE — Progress Notes (Signed)
NAME:  LEVETTA BOGNAR, MRN:  [id number], DOB:  14-Apr-1956, LOS: 24 ADMISSION DATE:  10/31/2019, CONSULTATION DATE:  4/18 REFERRING MD:  Neurology service CHIEF COMPLAINT:  Vent dep post craniotomy    Brief History   64 yo F w T2Dm, HTN, Chronic A-fib (coumadin and s/p pacemaker) found unconscious/incontinent on 4/16 and presented to [facility] ED code stroke w right gaze deviation and left sided weakness. IR endovascular thrombectomy w revascularization demonstrated post procedure edema. Pt required decompressive hemicraniectomy and ventilation. PCCM consult was ordered.  Past Medical History  HTN CAFib on coumadin T2DM    has a past medical history of Chronic atrial fibrillation ([place]) (1990s), Diabetes mellitus type 2, uncontrolled, without complications, Essential hypertension, History of cardiac catheterization (2001), sick sinus syndrome (07/1999), S/P MVR (mitral valve repair) (08/09/1999), and 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   has a past surgical history that includes Mitral valve repair (07/1999); transthoracic echocardiogram (05/2018); Cardiac catheterization (08/03/1999); Pacemaker insertion (07/1999); Pacemaker generator change (11/26/2008); IR PERCUTANEOUS ART THROMBECTOMY/INFUSION INTRACRANIAL INC DIAG ANGIO (10/31/2019); Radiology with anesthesia (N/A, 10/31/2019); IR PERCUTANEOUS ART THROMBECTOMY/INFUSION INTRACRANIAL INC DIAG ANGIO (10/31/2019); [place] (10/31/2019); and Craniectomy for depressed skull fracture (Right, 11/02/2019).   Significant Hospital Events   4/16 IR thrombectomy  4/18 for decompressive hemicraniectomy. ECHO ef 50% - low normal 4.21 - A fib RVR On scheduled cardizem po and prn lopressor 4/25- extubated   Consults:  [facility] Neurology PCCM  EP Dr Curt Bears 4/21 InPt Glycemic control  Procedures:  PIC LUE 4/17 > Oral et 4/18 > 4/25 L RA art line 4/18 > 4/20   Significant Diagnostic Tests:  CT head 4/18 > Worsened edema within the right MCA territory  with increased size. of right basal ganglia intraparenchymal hematoma. Increased amount of subarachnoid blood over the right hemisphere. 11 mm of leftward midline shift at the level of the foramen of Monro with early herniation of the right cingulate gyrus beneath the falx cerebri CT head 4/20 > continued interval evolution of large right MCA subacute infract with continued interval increase in edema and swelling.  Petechial hemorrhage throughout the infarct has increased.  Similar parenchymal hemorrhage centered within the right basal ganglia. CT 4/22 - 1. Stable appearance of large right MCA territory infarct with diffuse petechial hemorrhage. 2. Stable mass effect and midline shift. 3. Stable mild prominence of the left lateral ventricle. CT head 4/25 1. Continued interval evolution of large right MCA territory infarct with associated hemorrhagic transformation, relatively stable as compared to 11/06/2019. No evidence for interval hemorrhage or re-bleeding. Associated mass effect with up to 12 mm of right-to-left shift not significantly changed. Stable asymmetric dilatation of the left lateral ventricle. 2. Right craniectomy without adverse features.  Micro Data:  RVP 4/16 neg  MRSA PCR  4/17 neg  Blood 4/23 ua 4/23 Trach aspirate 4/23 Blood cultures negative growth 3 day mark reached 4/23  Antimicrobials:  bactriacin 4/18 - 4/18  cefepmine 4/24-4/26  Interim history/subjective:  No events overnight. Remains extubation.   Objective   Blood pressure 121/73, pulse 88, temperature 99.4 F (37.4 C), temperature source Axillary, resp. rate (!) 28, height 5\' 4"  (1.626 m), weight 60.3 kg, SpO2 98 %.        Intake/Output Summary (Last 24 hours) at 11/10/2019 1140 Last data filed at 11/10/2019 1000 Gross per 24 hour  Intake 1490.12 ml  Output 2525 ml  Net -1034.88 ml   Filed Weights   11/08/19 0500 11/09/19 0500 11/10/19 0500  Weight: 67 kg 67 kg 60.3 kg    General  Appearance: Elderly female, sitting in bed HEENT: NG in place  Lungs: Clear breath sounds, no crackles, wheeze  Heart: Irregular, Rate Controlled  Abdomen:  Soft, non-distended, active bowel sounds  Genitalia / Rectal:  Intact  Extremities:  -edema  Skin:  Intact Neurologic:  Alert, follows commands to right side. Left side hemiplegia, pupils intact, sluggish  Assessment & Plan:   Acute hypoxemic respiratory failure (resolved) in setting of emergent hemicraniectomy for vasogenic cerebral edema s/p R MCA/ thrombectomy. -extubated 4/25 Plan -Titrate Supplemental Oxygen for Saturation Goal >92 -Encourage good pulmonary hygiene     A.Fib RVR  >> Resolved  -Seen by Dr Curt Bears 11/05/19 H/O PAF Plan  --Per EP - Continue diltiazem (cardizem) po and lopressor Po   Electrolyte imbalance (Uremia) Plan -Trend BMP -D/C Lasix  -Replacing K now   Severe RMCA CVA 4/16 s/p thrombectomy. Plan -Per Neuro Stroke  -PT/OT/ST   Hyperglycemia. Plan -Trend Glucose  - Increased scheduled TF coverage  - SSI / lantus.  Anemia of critical illness Thrombocytopenia (resolved) Plan -Trend CBC -Transfuse for hemoglobin <7 (Currently 10.1)  -SCD for DVT ppx   Best practice:  Diet: Tube feeds Pain/Anxiety/Delirium protocol (if indicated):  Prn VAP protocol (if indicated): In place. DVT prophylaxis: lovenox GI prophylaxis: PPI. Glucose control: SSI / lantus. Mobility: BR. Code Status: full code.  Family Communication: Family updated at bedside 4/26   Disposition: Critical Care to sign off now. Please re-consult if needed.   Hayden Pedro, AGACNP-BC [facility]  Pgr: [phone]  PCCM Pgr: [phone]   LABS    PULMONARY Recent Labs  Lab 11/09/19 1711  PHART 7.558*  PCO2ART 40.2  PO2ART 64*  HCO3 35.5*  TCO2 37*  O2SAT 94.0    CBC Recent Labs  Lab 11/08/19 0550 11/08/19 0550 11/09/19 0341 11/09/19 1711 11/10/19 0457  HGB 9.7*   < >  10.3* 13.9 10.1*  HCT 30.3*   < > 32.5* 41.0 32.3*  WBC 12.5*  --  14.5*  --  16.2*  PLT 119*  --  157  --  215   < > = values in this interval not displayed.    COAGULATION No results for input(s): INR in the last 168 hours.  CARDIAC  No results for input(s): TROPONINI in the last 168 hours. No results for input(s): PROBNP in the last 168 hours.   CHEMISTRY Recent Labs  Lab 11/05/19 0324 11/05/19 1219 11/06/19 0400 11/06/19 1210 11/07/19 0605 11/07/19 0605 11/07/19 1303 11/08/19 0550 11/08/19 0550 11/09/19 0341 11/09/19 0341 11/09/19 1711 11/10/19 0457  NA 155*   < > 153*   < > 150*   < > 148* 148*  --  149*  --  149* 147*  K 3.9  --  3.3*  --  3.2*   < >  --  3.3*   < > 3.6   < > 3.3* 3.7  CL 127*  --  123*  --  109  --   --  106  --  105  --   --  104  CO2 24  --  24  --  31  --   --  33*  --  33*  --   --  32  GLUCOSE 320*  --  271*  --  166*  --   --  201*  --  175*  --   --  257*  BUN 28*  --  16  --  14  --   --  18  --  22  --   --  30*  CREATININE 0.95  --  0.65  --  0.53  --   --  0.61  --  0.80  --   --  0.94  CALCIUM 8.4*  --  7.9*  --  8.4*  --   --  8.4*  --  8.6*  --   --  8.4*  MG 2.2  --  2.2  --  2.4  --   --  2.5*  --  2.5*  --   --   --   PHOS  --   --  2.0*  --  2.7  --   --  2.7  --  3.7  --   --   --    < > = values in this interval not displayed.   Estimated Creatinine Clearance: 51.5 mL/min (by C-G formula based on SCr of 0.94 mg/dL).   LIVER No results for input(s): AST, ALT, ALKPHOS, BILITOT, PROT, ALBUMIN, INR in the last 168 hours.   INFECTIOUS Recent Labs  Lab 11/06/19 0000 11/06/19 0400 11/07/19 0122 11/07/19 0605 11/09/19 0041  LATICACIDVEN 1.7  --  1.4  --  1.4  PROCALCITON  --  0.88  --  0.63  --      ENDOCRINE CBG (last 3)  Recent Labs    11/10/19 0327 11/10/19 0756 11/10/19 1130  GLUCAP 199* 286* 217*         IMAGING x48h  - image(s) personally visualized  -   highlighted in bold DG Abd 1 View  Result  Date: 11/09/2019 CLINICAL DATA:  Evaluate NG tube EXAM: ABDOMEN - 1 VIEW COMPARISON:  None. FINDINGS: The NG tube terminates in the stomach in good position. Postoperative changes are seen over the right lower quadrant. IMPRESSION: The NG tube terminates in the stomach, in good position. Electronically Signed   By: Dorise Bullion III M.D   On: 11/09/2019 17:23   CT HEAD WO CONTRAST  Result Date: 11/09/2019 CLINICAL DATA:  Follow-up examination for acute stroke. EXAM: CT HEAD WITHOUT CONTRAST TECHNIQUE: Contiguous axial images were obtained from the base of the skull through the vertex without intravenous contrast. COMPARISON:  Prior head CT from 11/06/2019. FINDINGS: Brain: There has been continued interval evolution of large right MCA territory infarct, stable in size and distribution as compared to previous. Associated edema is more hypodense as compared to previous, although relatively similar in size and morphology. Associated hemorrhagic transformation with hemorrhage involving the right caudate and lentiform nuclei is not significantly changed. Additional scattered hemorrhage throughout the infarcted territory also little interval changed. Blood again noted along the right sylvian fissure. Prior right craniectomy with similar swelling of the brain through the craniectomy defect. Persistent right-to-left midline shift measures up to 12 mm, little interval change from previous. Asymmetric dilatation of the left lateral ventricle is stable. Similar basilar cistern crowding. No new findings seen within the left cerebral hemisphere, brainstem, or cerebellum. Vascular: No new hyperdense vessel. Scattered vascular calcifications noted within the carotid siphons. Skull: Right craniectomy without adverse features. Skin staples remain in place. Sinuses/Orbits: Globes and orbital soft tissues within normal limits.Paranasal sinuses remain largely clear. No mastoid effusion. Other: None. IMPRESSION: 1. Continued  interval evolution of large right MCA territory infarct with associated hemorrhagic transformation, relatively stable as compared to 11/06/2019. No evidence for interval hemorrhage or re-bleeding. Associated mass effect with up to 12  mm of right-to-left shift not significantly changed. Stable asymmetric dilatation of the left lateral ventricle. 2. Right craniectomy without adverse features. Electronically Signed   By: Jeannine Boga M.D.   On: 11/09/2019 06:15   DG CHEST PORT 1 VIEW  Result Date: 11/09/2019 CLINICAL DATA:  S/p extubation. EXAM: PORTABLE CHEST 1 VIEW COMPARISON:  11/09/2019 FINDINGS: An endotracheal tube and NG tube have been removed. A RIGHT pleural effusion, RIGHT basilar atelectasis, and LEFT LOWER lung consolidation/atelectasis again noted. Median sternotomy, RIGHT-sided pacemaker and LEFT PICC line again identified. IMPRESSION: Endotracheal tube and NG tube removal without other significant change. Electronically Signed   By: Margarette Canada M.D.   On: 11/09/2019 17:02   DG CHEST PORT 1 VIEW  Result Date: 11/09/2019 CLINICAL DATA:  Code stroke.  Patient intubated. EXAM: PORTABLE CHEST 1 VIEW COMPARISON:  November 08, 2019 FINDINGS: The ETT is in good position. The left PICC line is in good position. The NG tube terminates below today's film. Stable pacemaker. Small right effusion with underlying opacity, similar in the interval. Left retrocardiac opacity remains with a probable small associated left effusion which is less prominent the interval. No other interval changes. IMPRESSION: 1. Support apparatus as above. 2. Stable small right effusion with underlying atelectasis. Stable left retrocardiac opacity. The small layering left effusion seen previously is less prominent. Electronically Signed   By: Dorise Bullion III M.D   On: 11/09/2019 11:54

## 2019-11-10 NOTE — Consult Note (Signed)
Physical Medicine and Rehabilitation Consult Reason for Consult: Left side weakness with right gaze deviation Referring Physician: Dr. Leonie Man   HPI: Krystal Little is a 64 y.o. right-handed female with history of hypertension, diabetes mellitus, chronic atrial fibrillation on Coumadin therapy as well as pacemaker and mitral valve repair.  Per chart review patient lives with spouse.  1 level home 2 steps to entry.  Independent and active prior to admission.  Presented 10/31/2019 with left-sided weakness neglect and right gaze deviation.  Cranial CT scan as well as CT angiogram head and neck showed a large right MCA territory nonhemorrhagic infarction.  Hyperdense right MCA suggesting extensive thrombus from the right ICA terminus to the MCA bifurcation.  Echocardiogram with ejection fraction of 55%.  Patient underwent endovascular thrombectomy revascularization per interventional radiology.  Follow-up imaging showed worsening edema involving the right middle cerebral artery causing significant radiologic mass-effect and patient underwent decompressive right hemicraniectomy placement of bone flap into abdominal subcutaneous pocket 11/02/2019 per Dr. Kathyrn Sheriff.  Currently n.p.o. with alternative means of nutritional support.  Lovenox initiated for DVT prophylaxis 11/09/2019.  Blood pressures continue to be monitored initially in need of Cleviprex.  She was weaned from ventilator.  Cardiology follow-up for atrial fibrillation maintained on amiodarone.  She did spike a low-grade fever WBC 15,000 and currently maintained on Maxipime.  Therapy evaluations completed with recommendations of physical medicine rehab consult.   Per nurse, pt has been off any sedating meds but hasn't woken up well- amantadine was started today.  LBM yesterday and voiding with purewick- haven't checked PVRs.   Review of Systems  Unable to perform ROS: Acuity of condition   Past Medical History:  Diagnosis Date  . Chronic  atrial fibrillation ([place]) 1990s   s/p DCCV then attempted ablation of complex A Flutter (@ [facility] - Dr. Deno Etienne), failed antiarrhythmics --> INR folllwed @ [facility] ->> now status post pacemaker placement with underlying A. fib.  . Diabetes mellitus type 2, uncontrolled, without complications    On Oral Medications ([facility])  . Essential hypertension   . History of cardiac catheterization 2001   R&LHC - normal Coronaries, no evidence of Restictive Cardiomyopathy or Constrictive Pericarditis (also @ [facility])  . Hx of sick sinus syndrome 07/1999   Wtih symptomatic bradycardia - syncope (Tachy-Brady)  . 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 CARDIAC CATHETERIZATION  08/03/1999   [place] - normal Coronaries; no sign of constriction or restrictive cardiomypathy  . CRANIECTOMY FOR DEPRESSED SKULL FRACTURE Right 11/02/2019   Procedure: DECOMPRESSIVE HEMI -CRANIECTOMY, IMPLANTATION OF SKULL FLAP TO RIGHT ABDOMEN;  Surgeon: Consuella Lose, MD;  Location: [place];  Service: Neurosurgery;  Laterality: Right;  . IR CT HEAD LTD  10/31/2019  . IR PERCUTANEOUS ART THROMBECTOMY/INFUSION INTRACRANIAL INC DIAG ANGIO  10/31/2019      . IR PERCUTANEOUS ART THROMBECTOMY/INFUSION INTRACRANIAL INC DIAG ANGIO  10/31/2019  . MITRAL VALVE REPAIR  07/1999   Both Ant& Post leaflet repair - quadrangular resection&caudal transposition, #32 Sequin Annuloplasty ring  . PACEMAKER GENERATOR CHANGE  11/26/2008  Medtronic Adapta L(? if it has been checked since 02/2013)  . PACEMAKER INSERTION  07/1999   for SSS in  setting of Chronic Afib; Medtronic Kappa [id number], [id number].  Marland Kitchen RADIOLOGY WITH ANESTHESIA N/A 10/31/2019   Procedure: IR WITH ANESTHESIA;  Surgeon: Radiologist, Medication, MD;  Location: [place];  Service: Radiology;  Laterality: N/A;  . TRANSTHORACIC ECHOCARDIOGRAM  05/2018   Normal LV size and thickness.  EF estimated 50%.  Inferobasal HK and flattened septum c/w with elevated PA pressures. Mild functional mitral stenosis at rest (postoperative).  Moderate left atrial dilation and mild right atrial dilation.  Mean PA pressures estimated 52 mmHg.   Family History  Problem Relation Age of Onset  . Hypertension Mother   . Diabetes Sister   . Cancer Brother        Rectal  . Lung cancer Brother    Social History:  reports that she has never smoked. She has never used smokeless tobacco. She reports current alcohol use. She reports that she does not use drugs. Allergies: No Known Allergies Medications Prior to Admission  Medication Sig Dispense Refill  . acetaminophen (TYLENOL) 500 MG tablet Take 500-1,000 mg by mouth every 6 (six) hours as needed for headache (pain).    Marland Kitchen atenolol (TENORMIN) 50 MG tablet TAKE 1 TABLET(50 MG) BY MOUTH DAILY (Patient taking differently: Take 50 mg by mouth daily. ) 90 tablet 0  . CHROMIUM PO Take 1 tablet by mouth daily.    Marland Kitchen diltiazem (CARDIZEM CD) 180 MG 24 hr capsule TAKE 1 CAPSULE(180 MG) BY MOUTH DAILY (Patient taking differently: Take 180 mg by mouth daily. ) 90 capsule 2  . ezetimibe (ZETIA) 10 MG tablet Take 10 mg by mouth daily.    Haydee Salter Leaf POWD Take 1 capsule by mouth daily. "sugar destroyer"    . lisinopril (ZESTRIL) 5 MG tablet TAKE 1 TABLET(5 MG) BY MOUTH DAILY (Patient taking differently: Take 5 mg by mouth daily. ) 90 tablet 0  . metFORMIN (GLUCOPHAGE) 1000 MG tablet Take 1,000 mg by mouth 2 (two) times daily.    . Multiple Vitamin (MULTIVITAMIN WITH MINERALS) TABS tablet Take 1 tablet by mouth daily.    Marland Kitchen warfarin (COUMADIN) 5  MG tablet TAKE 1 TABLET BY MOUTH DAILY EXCEPT TAKE 1 AND 1/2 TABLET ON MONDAY AND FRIDAY (Patient taking differently: Take 5 mg by mouth See admin instructions. Take 1 1/2 tablets (7.5 mg) by mouth on Monday and Friday, take 1 tablet (5 mg) on all other days of the week) 45 tablet 3    Home: [place] expects to be discharged to:: Private residence Living Arrangements: Spouse/significant other, Children, Other relatives Available Help at Discharge: Family, Available 24 hours/day Type of Home: House Home Access: Stairs to enter [employer] of Steps: 2 Home Layout: One level Bathroom Shower/Tub: Chiropodist: Standard Home Equipment: None  Functional History: Prior Function Level of Independence: Independent Comments: ADLs, IADLs, driving, and enjoys playing basketball (favorite team [employer] basketball and likes [employer] football). Working at [employer] in Therapist, art. Functional Status:  Mobility: Bed Mobility Overal bed mobility: Needs Assistance Bed Mobility: Supine to Sit, Sit to Supine Rolling: Max assist, +2 for physical assistance Sidelying to sit: Max assist, +2 for physical assistance Supine to sit: Max assist, +2 for physical assistance Sit to supine: Max assist, +2 for physical assistance General bed mobility comments: pt did attempt to move R LE off EOB, continue to require maxAX2 for trunk elevation  Transfers Overall transfer level: Needs assistance Equipment used: 2 person hand held assist(with bed pad under bottom) Transfers: Sit to/from Stand Sit to Stand: Total assist, +2 physical assistance General transfer comment: L knee blocked, max tactile cues at PSIS and anterior shoulder to achieve full upright standing, pt tolerated x 30 sec, pt did attempt to hold head up Ambulation/Gait General Gait Details: unable at this time    ADL: ADL Overall ADL's : Needs assistance/impaired Grooming: Oral care, Total  assistance, Sitting Grooming Details (indicate cue type and reason): Max A for sitting balance. Total A for use of mouth swab General ADL Comments: Continues to require Total A for ADLs and bed mobility  Cognition: Cognition Overall Cognitive Status: Impaired/Different from baseline Orientation Level: Oriented to person, Oriented to place, Disoriented to time, Disoriented to situation Cognition Arousal/Alertness: Awake/alert(pt appears lethargic however pt is unable to open eyes) Behavior During Therapy: Flat affect Overall Cognitive Status: Impaired/Different from baseline Area of Impairment: Following commands, Problem solving Following Commands: Follows one step commands with increased time, Follows one step commands consistently, Follows multi-step commands inconsistently Problem Solving: Slow processing, Decreased initiation, Difficulty sequencing, Requires verbal cues, Requires tactile cues General Comments: pt remains non-verbal but is following commands Difficult to assess due to: Intubated  Blood pressure 123/69, pulse 99, temperature 99.4 F (37.4 C), temperature source Axillary, resp. rate (!) 28, height 5\' 4"  (1.626 m), weight 60.3 kg, SpO2 99 %. Physical Exam  Nursing note and vitals reviewed. Constitutional: She appears well-nourished. No distress.  Older female laying supine, NGT in place with O2 by [place], pulse 61, sats 97% and RR 27, no acute distress Would not wake to verbal or light tactile stimuli  HENT:  Head: Normocephalic.  u shaped incision on R top of head to R temple- dried/clotted blood noted- covered with dressings  NGT in place as stated above and O2 by [place]  Eyes:  Eyes closed - doesn't wake to stimulation- could not get pt to open eyes Appears to have L facial droop at rest  Neck: No tracheal deviation present.  Cardiovascular:  RRR- borderline bradycardia- regular rhythm  Respiratory: No stridor.  Coarse, junky- but good air movement B/L  GI:  Soft,  NT, ND; (+)BS hypoactive  Musculoskeletal:     Cervical back: Normal range of motion and neck supple.     Comments: No movement volitional movement seen Has mitten on R hand only- per nurse, no volitional movement on L- has movement normally on R side  Neurological:  Patient is lethargic but arousable.  Nasogastric tube in place.  She would not open her eyes but she did raise her right hand on verbal command.  She remained nonverbal throughout exam No vocalizations, no verbalizations Didn't respond to ticklish movements on hands and feet B/L; asleep- would not wake  Skin:  Head incision as stated before 2 IVs LUE- no signs of infiltration  Psychiatric:  Asleep     Results for orders placed or performed during the hospital encounter of 10/31/19 (from the past 24 hour(s))  Glucose, capillary     Status: Abnormal   Collection Time: 11/09/19 11:15 AM  Result Value Ref Range   Glucose-Capillary 199 (H) 70 - 99 mg/dL  Glucose, capillary     Status: Abnormal   Collection Time: 11/09/19  3:11 PM  Result Value Ref Range   Glucose-Capillary 111 (H) 70 - 99 mg/dL  I-STAT 7, (LYTES, BLD GAS, ICA, H+H)     Status: Abnormal  Collection Time: 11/09/19  5:11 PM  Result Value Ref Range   pH, Arterial 7.558 (H) 7.350 - 7.450   pCO2 arterial 40.2 32.0 - 48.0 mmHg   pO2, Arterial 64 (L) 83.0 - 108.0 mmHg   Bicarbonate 35.5 (H) 20.0 - 28.0 mmol/L   TCO2 37 (H) 22 - 32 mmol/L   O2 Saturation 94.0 %   Acid-Base Excess 12.0 (H) 0.0 - 2.0 mmol/L   Sodium 149 (H) 135 - 145 mmol/L   Potassium 3.3 (L) 3.5 - 5.1 mmol/L   Calcium, Ion 1.09 (L) 1.15 - 1.40 mmol/L   HCT 41.0 36.0 - 46.0 %   Hemoglobin 13.9 12.0 - 15.0 g/dL   Patient temperature 100.4 F    Collection site Radial    Drawn by RT    Sample type ARTERIAL   Urinalysis, Routine w reflex microscopic     Status: None   Collection Time: 11/09/19  5:52 PM  Result Value Ref Range   Color, Urine YELLOW YELLOW   APPearance CLEAR CLEAR    Specific Gravity, Urine 1.015 1.005 - 1.030   pH 8.0 5.0 - 8.0   Glucose, UA NEGATIVE NEGATIVE mg/dL   Hgb urine dipstick NEGATIVE NEGATIVE   Bilirubin Urine NEGATIVE NEGATIVE   Ketones, ur NEGATIVE NEGATIVE mg/dL   Protein, ur NEGATIVE NEGATIVE mg/dL   Nitrite NEGATIVE NEGATIVE   Leukocytes,Ua NEGATIVE NEGATIVE  Glucose, capillary     Status: Abnormal   Collection Time: 11/09/19  7:51 PM  Result Value Ref Range   Glucose-Capillary 245 (H) 70 - 99 mg/dL  Glucose, capillary     Status: Abnormal   Collection Time: 11/09/19 11:37 PM  Result Value Ref Range   Glucose-Capillary 264 (H) 70 - 99 mg/dL  Glucose, capillary     Status: Abnormal   Collection Time: 11/10/19  3:27 AM  Result Value Ref Range   Glucose-Capillary 199 (H) 70 - 99 mg/dL  CBC     Status: Abnormal   Collection Time: 11/10/19  4:57 AM  Result Value Ref Range   WBC 16.2 (H) 4.0 - 10.5 K/uL   RBC 3.34 (L) 3.87 - 5.11 MIL/uL   Hemoglobin 10.1 (L) 12.0 - 15.0 g/dL   HCT 32.3 (L) 36.0 - 46.0 %   MCV 96.7 80.0 - 100.0 fL   MCH 30.2 26.0 - 34.0 pg   MCHC 31.3 30.0 - 36.0 g/dL   RDW 14.6 11.5 - 15.5 %   Platelets 215 150 - 400 K/uL   nRBC 0.1 0.0 - 0.2 %  Basic metabolic panel     Status: Abnormal   Collection Time: 11/10/19  4:57 AM  Result Value Ref Range   Sodium 147 (H) 135 - 145 mmol/L   Potassium 3.7 3.5 - 5.1 mmol/L   Chloride 104 98 - 111 mmol/L   CO2 32 22 - 32 mmol/L   Glucose, Bld 257 (H) 70 - 99 mg/dL   BUN 30 (H) 8 - 23 mg/dL   Creatinine, Ser 0.94 0.44 - 1.00 mg/dL   Calcium 8.4 (L) 8.9 - 10.3 mg/dL   GFR calc non Af Amer >60 >60 mL/min   GFR calc Af Amer >60 >60 mL/min   Anion gap 11 5 - 15  Glucose, capillary     Status: Abnormal   Collection Time: 11/10/19  7:56 AM  Result Value Ref Range   Glucose-Capillary 286 (H) 70 - 99 mg/dL   Comment 1 Notify RN    Comment 2 Document in  Chart    DG Abd 1 View  Result Date: 11/09/2019 CLINICAL DATA:  Evaluate NG tube EXAM: ABDOMEN - 1 VIEW  COMPARISON:  None. FINDINGS: The NG tube terminates in the stomach in good position. Postoperative changes are seen over the right lower quadrant. IMPRESSION: The NG tube terminates in the stomach, in good position. Electronically Signed   By: Dorise Bullion III M.D   On: 11/09/2019 17:23   CT HEAD WO CONTRAST  Result Date: 11/09/2019 CLINICAL DATA:  Follow-up examination for acute stroke. EXAM: CT HEAD WITHOUT CONTRAST TECHNIQUE: Contiguous axial images were obtained from the base of the skull through the vertex without intravenous contrast. COMPARISON:  Prior head CT from 11/06/2019. FINDINGS: Brain: There has been continued interval evolution of large right MCA territory infarct, stable in size and distribution as compared to previous. Associated edema is more hypodense as compared to previous, although relatively similar in size and morphology. Associated hemorrhagic transformation with hemorrhage involving the right caudate and lentiform nuclei is not significantly changed. Additional scattered hemorrhage throughout the infarcted territory also little interval changed. Blood again noted along the right sylvian fissure. Prior right craniectomy with similar swelling of the brain through the craniectomy defect. Persistent right-to-left midline shift measures up to 12 mm, little interval change from previous. Asymmetric dilatation of the left lateral ventricle is stable. Similar basilar cistern crowding. No new findings seen within the left cerebral hemisphere, brainstem, or cerebellum. Vascular: No new hyperdense vessel. Scattered vascular calcifications noted within the carotid siphons. Skull: Right craniectomy without adverse features. Skin staples remain in place. Sinuses/Orbits: Globes and orbital soft tissues within normal limits.Paranasal sinuses remain largely clear. No mastoid effusion. Other: None. IMPRESSION: 1. Continued interval evolution of large right MCA territory infarct with associated  hemorrhagic transformation, relatively stable as compared to 11/06/2019. No evidence for interval hemorrhage or re-bleeding. Associated mass effect with up to 12 mm of right-to-left shift not significantly changed. Stable asymmetric dilatation of the left lateral ventricle. 2. Right craniectomy without adverse features. Electronically Signed   By: Jeannine Boga M.D.   On: 11/09/2019 06:15   DG CHEST PORT 1 VIEW  Result Date: 11/09/2019 CLINICAL DATA:  S/p extubation. EXAM: PORTABLE CHEST 1 VIEW COMPARISON:  11/09/2019 FINDINGS: An endotracheal tube and NG tube have been removed. A RIGHT pleural effusion, RIGHT basilar atelectasis, and LEFT LOWER lung consolidation/atelectasis again noted. Median sternotomy, RIGHT-sided pacemaker and LEFT PICC line again identified. IMPRESSION: Endotracheal tube and NG tube removal without other significant change. Electronically Signed   By: Margarette Canada M.D.   On: 11/09/2019 17:02   DG CHEST PORT 1 VIEW  Result Date: 11/09/2019 CLINICAL DATA:  Code stroke.  Patient intubated. EXAM: PORTABLE CHEST 1 VIEW COMPARISON:  November 08, 2019 FINDINGS: The ETT is in good position. The left PICC line is in good position. The NG tube terminates below today's film. Stable pacemaker. Small right effusion with underlying opacity, similar in the interval. Left retrocardiac opacity remains with a probable small associated left effusion which is less prominent the interval. No other interval changes. IMPRESSION: 1. Support apparatus as above. 2. Stable small right effusion with underlying atelectasis. Stable left retrocardiac opacity. The small layering left effusion seen previously is less prominent. Electronically Signed   By: Dorise Bullion III M.D   On: 11/09/2019 11:54     Assessment/Plan: Diagnosis: Large R MCA infarct s/p thrombectomy and R hemicraniectomy with bone flap in abdomen L hemiplegia with sedation 1. Does the need for close,  24 hr/day medical supervision in  concert with the patient's rehab needs make it unreasonable for this patient to be served in a less intensive setting? Potentially 2. Co-Morbidities requiring supervision/potential complications: DM, HTN, A fib, pacemaker, L hemiplegia 3. Due to bladder management, bowel management, safety, skin/wound care, disease management, medication administration, pain management and patient education, does the patient require 24 hr/day rehab nursing? Potentially 4. Does the patient require coordinated care of a physician, rehab nurse, therapy disciplines of PT, OT, and SLP to address physical and functional deficits in the context of the above medical diagnosis(es)? Yes Addressing deficits in the following areas: balance, endurance, locomotion, strength, transferring, bathing, dressing, feeding, grooming, toileting, cognition, speech, language and swallowing 5. Can the patient actively participate in an intensive therapy program of at least 3 hrs of therapy per day at least 5 days per week? Yes 6. The potential for patient to make measurable gains while on inpatient rehab is good and fair 7. Anticipated functional outcomes upon discharge from inpatient rehab are min assist  with PT, min assist with OT, min assist with SLP. 8. Estimated rehab length of stay to reach the above functional goals is: 3 weeks 9. Anticipated discharge destination: Home 10. Overall Rehab/Functional Prognosis: good and fair  RECOMMENDATIONS: This patient's condition is appropriate for continued rehabilitative care in the following setting: CIR Patient has agreed to participate in recommended program. Potentially Note that insurance prior authorization may be required for reimbursement for recommended care.  Comment:  1. If Amantadine doesn't wake pt enough, since BP is well controlled, can think about low dose Ritalin 5mg  BID with breakfast, lunch timing.   2. Sounded a little junky today  3. Thank you for this consult- will  con't to follow remotely for CIR.    Lavon Paganini Angiulli, PA-C   I have personally performed a face to face diagnostic evaluation of this patient and formulated the key components of the plan.  Additionally, I have personally reviewed laboratory data, imaging studies, as well as relevant notes and concur with the physician assistant's documentation above.    11/10/2019

## 2019-11-11 ENCOUNTER — Inpatient Hospital Stay (HOSPITAL_COMMUNITY): Payer: Medicare Other

## 2019-11-11 DIAGNOSIS — I482 Chronic atrial fibrillation, unspecified: Secondary | ICD-10-CM | POA: Diagnosis not present

## 2019-11-11 DIAGNOSIS — I63411 Cerebral infarction due to embolism of right middle cerebral artery: Secondary | ICD-10-CM | POA: Diagnosis not present

## 2019-11-11 LAB — BASIC METABOLIC PANEL
Anion gap: 9 (ref 5–15)
BUN: 32 mg/dL — ABNORMAL HIGH (ref 8–23)
CO2: 30 mmol/L (ref 22–32)
Calcium: 8.2 mg/dL — ABNORMAL LOW (ref 8.9–10.3)
Chloride: 110 mmol/L (ref 98–111)
Creatinine, Ser: 0.86 mg/dL (ref 0.44–1.00)
GFR calc Af Amer: >60 mL/min (ref >60)
GFR calc non Af Amer: >60 mL/min (ref >60)
Glucose, Bld: 186 mg/dL — ABNORMAL HIGH (ref 70–99)
Potassium: 3.9 mmol/L (ref 3.5–5.1)
Sodium: 149 mmol/L — ABNORMAL HIGH (ref 135–145)

## 2019-11-11 LAB — CBC
HCT: 31.8 % — ABNORMAL LOW (ref 36.0–46.0)
Hemoglobin: 9.9 g/dL — ABNORMAL LOW (ref 12.0–15.0)
MCH: 30.5 pg (ref 26.0–34.0)
MCHC: 31.1 g/dL (ref 30.0–36.0)
MCV: 97.8 fL (ref 80.0–100.0)
Platelets: 231 10*3/uL (ref 150–400)
RBC: 3.25 MIL/uL — ABNORMAL LOW (ref 3.87–5.11)
RDW: 14.7 % (ref 11.5–15.5)
WBC: 16.3 10*3/uL — ABNORMAL HIGH (ref 4.0–10.5)
nRBC: 0.2 % (ref 0.0–0.2)

## 2019-11-11 LAB — GLUCOSE, CAPILLARY
Glucose-Capillary: 142 mg/dL — ABNORMAL HIGH (ref 70–99)
Glucose-Capillary: 165 mg/dL — ABNORMAL HIGH (ref 70–99)
Glucose-Capillary: 145 mg/dL — ABNORMAL HIGH (ref 70–99)
Glucose-Capillary: 170 mg/dL — ABNORMAL HIGH (ref 70–99)
Glucose-Capillary: 127 mg/dL — ABNORMAL HIGH (ref 70–99)
Glucose-Capillary: 149 mg/dL — ABNORMAL HIGH (ref 70–99)

## 2019-11-11 LAB — MAGNESIUM: Magnesium: 2.8 mg/dL — ABNORMAL HIGH (ref 1.7–2.4)

## 2019-11-11 LAB — PHOSPHORUS: Phosphorus: 3.5 mg/dL (ref 2.5–4.6)

## 2019-11-11 LAB — BRAIN NATRIURETIC PEPTIDE: B Natriuretic Peptide: 93.9 pg/mL (ref 0.0–100.0)

## 2019-11-11 MED ORDER — AMANTADINE HCL 50 MG/5ML PO SYRP
100.0000 mg | ORAL_SOLUTION | Freq: Two times a day (BID) | ORAL | Status: DC
  Administered 2019-11-11 – 2019-11-20 (×19): 100 mg
  Filled 2019-11-11 (×21): qty 10

## 2019-11-11 MED ORDER — FUROSEMIDE 10 MG/ML IJ SOLN: 20.0000 mg | Freq: Once | INTRAMUSCULAR | Status: DC

## 2019-11-11 MED ORDER — FUROSEMIDE 10 MG/ML IJ SOLN
40.0000 mg | Freq: Once | INTRAMUSCULAR | Status: AC
  Administered 2019-11-11: 40 mg via INTRAVENOUS
  Filled 2019-11-11: qty 4

## 2019-11-11 NOTE — Progress Notes (Signed)
Family at the bedside updated of patient's new room assignment. Husband requested I call pt's daughter which I have done. Deven Audi, Rande Brunt, RN

## 2019-11-11 NOTE — Progress Notes (Signed)
CXR with continued pleural effusions, slightly tachypnic but in no acute distress. suspect still could benefit from further diuresis, will order lasix 40mg  x1.   Roland Rack, MD [facility] [phone]  If 7pm- 7am, please page neurology on call as listed in [place].

## 2019-11-11 NOTE — Progress Notes (Signed)
Pt transferred from 4N. Pt responds to voice. Call bell in reach and bed alarm on. Skin assessment done with Merleen Nicely, RN. Old ulcer to RLL. Protective foam in place and on buttock. Triple lumen picc in LUA. SCD's in place. Bedside Tele monitor in place.  Vital Signs @MEWSNOTE @  11/11/19 1620  Assess: MEWS Score  Temp 99.1 F (37.3 C)  BP 127/75  Pulse Rate (!) 108  ECG Heart Rate 95  Resp (!) 21  Level of Consciousness Responds to Voice  SpO2 99 %  Assess: MEWS Score  MEWS Temp 0  MEWS Systolic 0  MEWS Pulse 0  MEWS RR 1  MEWS LOC 1  MEWS Score 2  MEWS Score Color Yellow  Assess: if the MEWS score is Yellow or Red  Were vital signs taken at a resting state? Yes  Focused Assessment Documented focused assessment  Early Detection of Sepsis Score *See Row Information* Low  MEWS guidelines implemented *See Row Information* Yes  Treat  MEWS Interventions Other (Comment) (Will reassess in 2 hrs.)  Take Vital Signs  Increase Vital Sign Frequency  Yellow: Q 2hr X 2 then Q 4hr X 2, if remains yellow, continue Q 4hrs  Escalate  MEWS: Escalate Yellow: discuss with charge nurse/RN and consider discussing with provider and RRT  Notify: Charge Nurse/RN  Name of Charge Nurse/RN Notified Erika, RN  Date Charge Nurse/RN Notified 11/11/19  Time Charge Nurse/RN Notified 1631     Krystal Little 11/11/2019,4:32 PM

## 2019-11-11 NOTE — Progress Notes (Signed)
  Speech Language Pathology Treatment: Dysphagia  Patient Details Name: Krystal Little MRN: [id number] DOB: 11/13/1955 Today's Date: 11/11/2019 Time: 9381-0175 SLP Time Calculation (min) (ACUTE ONLY): 11 min  Assessment / Plan / Recommendation Clinical Impression  Pt is less interactive today compared to previous date; discussed with RN. Today she did not verbalize at all, and only followed commands to open her mouth and stick out her tongue. Oral movements are generally limited in ROM. She bit down on the swab during oral care, but showed no signs of awareness when presented with PO trials regardless of consistency, gustatory stimulation, and Max cues. Speech/language evaluation deferred given less responses noted today. Will continue to follow.     HPI HPI: 64 y.o. female presented to [facility] ED as Krystal code stroke for R gaze deviation and L-sided weakness. CT revealed Krystal large R MCA infarct. Pt s/p R hemicraniectomy with bone flap placed in abdomen on 4/18. ETT 4/18-4/25. PMH HTN, DM2, chronic Krystal. Fib (coumadin) s/p pace maker.      SLP Plan  Continue with current plan of care       Recommendations  Diet recommendations: NPO Medication Administration: Via alternative means                Oral Care Recommendations: Oral care QID Follow up Recommendations: Inpatient Rehab SLP Visit Diagnosis: Dysphagia, unspecified (R13.10) Plan: Continue with current plan of care       GO                 Osie Bond., M.[place]. [place] [facility] Pager [phone] Office [phone]  11/11/2019, 9:23 AM

## 2019-11-11 NOTE — Progress Notes (Signed)
Kathlee Nations Rt came to see the pt and recommended a chest x-ray and possible lasix. RN paged MD Aroor. MD aware no new orders yet. [name] gave her an update.

## 2019-11-11 NOTE — Progress Notes (Signed)
Called Kathlee Nations RT to come assess pt when available. Pt is congested and RN able to suction orally. RT will assess when able to or will let night shift RT know.

## 2019-11-11 NOTE — Progress Notes (Signed)
Chest xray done.

## 2019-11-11 NOTE — Progress Notes (Signed)
RT called to bedside to assess pt. Pt with coarse crackles in bases as well as RUL. LUL sounds diminished. RT will put in NTS PRN order but pt could benefit from CXR and possibly lasix as she sounds more "wet" than rhonchous from secretions. RN informed

## 2019-11-11 NOTE — Progress Notes (Signed)
STROKE TEAM PROGRESS NOTE   INTERVAL HISTORY Patient is sitting up in bed.  She remains with eyes closed and is difficult to arouse but does follow commands on the right side.  Her voice is extremely hypophonic and difficult to hear.  Vital signs are stable.  Her husband is at the bedside.  Speech therapy has seen him and recommend continue nil by mouth. Cardiology recommend discontinuing amiodarone and continuing Cardizem.  Vital signs are stable OBJECTIVE Vitals:   11/11/19 0600 11/11/19 0700 11/11/19 0800 11/11/19 0900  BP: (!) 145/65 (!) 134/56 129/65 134/64  Pulse: 70 68 93 (!) 109  Resp: (!) 31 (!) 31 (!) 27 (!) 30  Temp:   (!) 100.9 F (38.3 C)   TempSrc:   Axillary   SpO2: 97% 97% 98% 99%  Weight:      Height:        CBC:  Recent Labs  Lab 11/10/19 0457 11/11/19 0454  WBC 16.2* 16.3*  HGB 10.1* 9.9*  HCT 32.3* 31.8*  MCV 96.7 97.8  PLT 215 267   Basic Metabolic Panel:  Recent Labs  Lab 11/09/19 0341 11/09/19 1711 11/10/19 0457 11/11/19 0454  NA 149*   < > 147* 149*  K 3.6   < > 3.7 3.9  CL 105  --  104 110  CO2 33*  --  32 30  GLUCOSE 175*  --  257* 186*  BUN 22  --  30* 32*  CREATININE 0.80  --  0.94 0.86  CALCIUM 8.6*  --  8.4* 8.2*  MG 2.5*  --   --  2.8*  PHOS 3.7  --   --  3.5   < > = values in this interval not displayed.   IMAGING past 24h  No results found.   PHYSICAL EXAM      Middle-aged African-American lady not in distress.  Sitting up in bed. . Afebrile. Head is nontraumatic. Neck is supple without bruit.    Cardiac exam irregular heart sounds no murmur or gallop. Lungs are clear to auscultation. Distal pulses are well felt.   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  Follows commands   on the right side but has left hemiplegia with eye forced opening, eyes still in right gaze position, not blinking to visual threat on left, doll's eyes +, bilateral pupil  31mm, sluggish to light. Corneal reflex present, ..  Tongue protrusion not cooperative. However, she was able to follow commands on the right hand and foot. On pain stimulation, RUE and RLE mild withdraw 2/5, LUE extension posturing and LLE slight withdraw. DTR 1+ and bilateral upgoing toes. Sensation, coordination and gait not tested.   ASSESSMENT/PLAN Krystal Little is a 64 y.o. female with history of HTN, DM2, hx of mitral valve repair, chronic a. Fib (coumadin - INR 1.2 on admission) s/p pacemaker who presented to [facility] ED as a code stroke for right gaze deviation, urinary incontinence and left sided weakness. She did not receive IV t-PA due to anticoagulation with coumadin. IR -> ICA terminus occlusion. Mechanical thrombectomy performed with 2 stent retriever + aspiration passes in the MCA-ICA (solitaire and embotrap), 1 stent retriever + aspiration pass MCA (embotrap) and 1 direct contact aspiration in the right ACA. Complete recanalization achieved (TICI3).  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 Hyperdense right MCA suggesting extensive thrombus from the right ICA terminus through the MCA bifurcation Some mass effect with partial effacement the sulci on the right. No midline shift. ASPECTS is 4/10   CTA H&N - Total occlusion of right ICA terminus. Thrombus extends through the right MCA bifurcation with poor collaterals.   CT Perfusion - Confirmed large right MCA territory infarct with core volume of 107 mL. Large mismatch volume of 9.6 with a mismatch ratio of 1.9.   CT head -  Large area of hypoattenuation within the right MCA territory, consistent with subacute infarct. Multifocal hyperdensity within the right MCA territory is consistent with hemorrhage or contrast staining.  The more peripheral hyperdensities are favored to be hemorrhagic. 6 mm leftward midline shift  CT Head 11/02/19 - Worsened edema within the right MCA territory with increased size of right basal ganglia intraparenchymal hematoma. Increased amount of subarachnoid blood over the right hemisphere. 11 mm of leftward midline shift  MRI head - not performed - PPM   CT head 4/20 postop changed from R crani. Evolution large R MCA infarct w/ interval increase edema/swelling. Increased petechial hemorrhage throughout. SAH also increased. R BG HT slightly increased. Mass effect, partial effacement ventricles w/ 32mm L midline shift   CT 4/22 stable large R MCA infarct w/ diffuse petechial hemorrhage   CT Head 11/09/19 - Continued interval evolution of large right MCA territory infarct with associated hemorrhagic transformation, relatively stable as compared to 11/06/2019. No evidence for interval hemorrhage or re-bleeding. Associated mass effect with up to 12 mm of right-to-left shift not significantly changed. Stable asymmetric dilatation of the left lateral ventricle.  2D Echo - EF 50-55%. No source of embolus. 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   Sars Corona Virus 2 - negative  LDL - 158  HgbA1c - 9.7  UDS - negative  VTE prophylaxis - SCDs  warfarin daily prior to admission, now on No antithrombotic  Therapy recommendations:  CIR - admission coordinator following  Disposition:  Pending  Acute hypoxemia respiratory failure d/t stroke  Intubated for emergent hemicraniectomy for cerebral edema  CCM on board  On fentanyl->prn sedation  CXR 4/20 - Complete opacification of the retrocardiac left lung base worrisome for left lower lobe atelectasis.  CXR 4/22 B pulm infiltrates / edema and B pleural effusions  CXR 4/23 - Bilateral pleural effusions, right greater than left, with associated opacity, likely atelectasis.  CXR 11/09/19 - Stable small right effusion with underlying  atelectasis. Stableeft retrocardiac opacity. The small layering left effusion seen previously is less prominent.  Lasix 40 and d/c IVF  Cerebral edema w/ subfalcine herniation s/p decompressive hemicraniectomy  CT 4/22 stable large R MCA infarct w/ diffuse petechial hemorrhage  3% saline @ 50 cc/hr -> NS @ 50 -> off  Na 138->...-151-150-150->148->148  NSG on board s/p [facility] 4/18  PICC line placed  23.4% x 2  Now 7 days out, will allow Na to gradually trend down.  Atrial fibrillation w/ RVR  Telemetry showed A. fib, rate controlled  On Coumadin PTA, family stated compliance  Subtherapeutic INR 1.2 on presentation -> 1.4  No AC at the time due to large infarct and hemorrhagic conversion (previously refused DOACs)  metoprolol 50 bid    On cardizem 60 q 8h->60 q 6h  amiodarone gtt converted to po 4/22 - 400 bid x 1 week then 400 daily  EP Cardiology on board  Hypertension->Hypotension  Home BP meds: Atenolol; Cardizem ; lisinopril  Current BP meds: cardizem, metoprolol 50 bid  Off cleviprex  lisinopril d/c due to low BP  BP improved  Resume metoprolol 25 bid -> 50 bid . SBP goal < 160 mm Hg . Long-term BP goal normotensive  Hyperlipidemia  Home Lipid lowering medication: zetia  LDL 158, goal < 70  Hold off statin for know due to left hemorrhagic conversion  Consider statin at discharge  Diabetes, uncontrolled  Home diabetic meds: metformin  HgbA1c 9.7, goal < 7.0  CBG monitoring  Hyperglycemia - on lantus 25 bid, novolog 7u q4h     Continue SSI 0-20  DM coordinator consulted  Anemia and thrombocytopenia, improving  Platelet 157    Hb 10.3    Close monitoring  Dysphagia  NPO  Has NG  On TF @ 54  Speech following  Other Stroke Risk Factors  Advanced age  ETOH use, advised to drink no more than 1 alcoholic beverage per day.  Other Active Problems  Code status - Full code  SSS with pacemaker, not compatible with  MRI  Hypokalemia - 3.6     Hypophosphatemia 2.7    - supplement   Leukocytosis WBC 4.5    Hypermagnesemia - 2.5     Temperature - 100.4 (UA still pending from 4/23 (will ask nurses to check) ; Blood cultures no growth from 4/23 ; CXR today not c/w pneumonia ; tracheal aspirate 4/23 - normal))  Add amandtadine to assist with wakeful state and rehab    Continue tube feeds for now.  Mobilize out of bed.  Continue ongoing therapies.  Transfer to neurology stepdown bed if available.  Has low-grade fever.  Will check UA and follow blood cultures.  Add amantadine to promote wakefulness.  Appreciate cardiology help.  Long discussion with patient and husband at the bedside and answered questions.  This patient is critically ill and at significant risk of neurological worsening, death and care requires constant monitoring of vital signs, hemodynamics,respiratory and cardiac monitoring, extensive review of multiple databases, frequent neurological assessment, discussion with family, other specialists and medical decision making of high complexity.I have made any additions or clarifications directly to the above note.This critical care time does not reflect procedure time, or teaching time or supervisory time of PA/NP/Med Resident etc but could involve care discussion time.  I spent 30 minutes of neurocritical care time  in the care of  this patient.     Hospital day # 11  Antony Contras, MD  To contact Stroke Continuity provider, please refer to [email or web address] After hours, contact General Neurology

## 2019-11-11 NOTE — Progress Notes (Addendum)
Progress Note  Patient Name: Krystal Little Date of Encounter: 11/11/2019  Primary Cardiologist: Glenetta Hew, MD   Subjective   Extubated 2 days ago.  Working with speech therapy.  Unfortunately continues to have weakness on the left side.  Inpatient Medications    Scheduled Meds: . amiodarone  400 mg Per Tube BID   Followed by  . [START ON 11/13/2019] amiodarone  400 mg Per Tube Daily  . chlorhexidine gluconate (MEDLINE KIT)  15 mL Mouth Rinse BID  . Chlorhexidine Gluconate Cloth  6 each Topical Q0600  . diltiazem  60 mg Per Tube Q6H  . enoxaparin (LOVENOX) injection  40 mg Subcutaneous Q24H  . insulin aspart  0-9 Units Subcutaneous Q4H  . insulin aspart  12 Units Subcutaneous Q4H  . insulin glargine  25 Units Subcutaneous BID  . mouth rinse  15 mL Mouth Rinse 10 times per day  . metoprolol tartrate  50 mg Per Tube BID  . pantoprazole sodium  40 mg Per Tube Daily  . polyethylene glycol  17 g Per Tube BID  . senna-docusate  1 tablet Per Tube BID  . sodium chloride flush  10-40 mL Intracatheter Q12H   Continuous Infusions: . sodium chloride 10 mL/hr at 11/11/19 0700  . feeding supplement (JEVITY 1.2 CAL) 1,000 mL (11/10/19 1954)   PRN Meds: acetaminophen **OR** acetaminophen (TYLENOL) oral liquid 160 mg/5 mL **OR** acetaminophen, fentaNYL (SUBLIMAZE) injection, metoprolol tartrate, ondansetron (ZOFRAN) IV, senna-docusate, sodium chloride flush   Vital Signs    Vitals:   11/11/19 0458 11/11/19 0500 11/11/19 0600 11/11/19 0700  BP:  (!) 113/52 (!) 145/65 (!) 134/56  Pulse:  66 70 68  Resp:  (!) 27 (!) 31 (!) 31  Temp:      TempSrc:      SpO2:  99% 97% 97%  Weight: 60.5 kg     Height:        Intake/Output Summary (Last 24 hours) at 11/11/2019 0744 Last data filed at 11/11/2019 0700 Gross per 24 hour  Intake 1295.92 ml  Output 800 ml  Net 495.92 ml   Last 3 Weights 11/11/2019 11/10/2019 11/09/2019  Weight (lbs) 133 lb 6.1 oz 132 lb 15 oz 147 lb 11.3 oz  Weight  (kg) 60.5 kg 60.3 kg 67 kg      Telemetry    Atrial fibrillation, rates 70-100- Personally Reviewed  ECG    None new- Personally Reviewed  Physical Exam   GEN: No acute distress.   Neck: No JVD Cardiac:  Irregular, no murmurs, rubs, or gallops.  Respiratory: Clear to auscultation bilaterally. GI: Soft, nontender, non-distended  MS: No edema; No deformity. Neuro:   Does not open eyes to stimulation left upper extremity not moving Psych: Normal affect   Labs    High Sensitivity Troponin:   Recent Labs  Lab 11/06/19 0400  TROPONINIHS 39*      Chemistry Recent Labs  Lab 11/09/19 0341 11/09/19 0341 11/09/19 1711 11/10/19 0457 11/11/19 0454  NA 149*   < > 149* 147* 149*  K 3.6   < > 3.3* 3.7 3.9  CL 105  --   --  104 110  CO2 33*  --   --  32 30  GLUCOSE 175*  --   --  257* 186*  BUN 22  --   --  30* 32*  CREATININE 0.80  --   --  0.94 0.86  CALCIUM 8.6*  --   --  8.4* 8.2*  GFRNONAA >60  --   --  >60 >60  GFRAA >60  --   --  >60 >60  ANIONGAP 11  --   --  11 9   < > = values in this interval not displayed.     Hematology Recent Labs  Lab 11/09/19 0341 11/09/19 0341 11/09/19 1711 11/10/19 0457 11/11/19 0454  WBC 14.5*  --   --  16.2* 16.3*  RBC 3.33*  --   --  3.34* 3.25*  HGB 10.3*   < > 13.9 10.1* 9.9*  HCT 32.5*   < > 41.0 32.3* 31.8*  MCV 97.6  --   --  96.7 97.8  MCH 30.9  --   --  30.2 30.5  MCHC 31.7  --   --  31.3 31.1  RDW 14.6  --   --  14.6 14.7  PLT 157  --   --  215 231   < > = values in this interval not displayed.    BNPNo results for input(s): BNP, PROBNP in the last 168 hours.   DDimer No results for input(s): DDIMER in the last 168 hours.   Radiology    DG Abd 1 View  Result Date: 11/09/2019 CLINICAL DATA:  Evaluate NG tube EXAM: ABDOMEN - 1 VIEW COMPARISON:  None. FINDINGS: The NG tube terminates in the stomach in good position. Postoperative changes are seen over the right lower quadrant. IMPRESSION: The NG tube terminates  in the stomach, in good position. Electronically Signed   By: Dorise Bullion III M.D   On: 11/09/2019 17:23   DG CHEST PORT 1 VIEW  Result Date: 11/09/2019 CLINICAL DATA:  S/p extubation. EXAM: PORTABLE CHEST 1 VIEW COMPARISON:  11/09/2019 FINDINGS: An endotracheal tube and NG tube have been removed. A RIGHT pleural effusion, RIGHT basilar atelectasis, and LEFT LOWER lung consolidation/atelectasis again noted. Median sternotomy, RIGHT-sided pacemaker and LEFT PICC line again identified. IMPRESSION: Endotracheal tube and NG tube removal without other significant change. Electronically Signed   By: Margarette Canada M.D.   On: 11/09/2019 17:02    Cardiac Studies   TTE 11/02/19 1. Left ventricular ejection fraction, by estimation, is 50 to 55%. The  left ventricle has low normal function. There is moderate left ventricular  hypertrophy. Left ventricular diastolic parameters are indeterminate.  2. Right ventricular systolic function is moderately reduced. The right  ventricular size is normal.  3. Left atrial size was moderately dilated.  4. The mitral valve has been repaired. There is a prosthetic annuloplasty  ring present in the mitral position. No evidence of mitral valve  regurgitation. The mean mitral valve gradient is 4.0 mmHg.  5. Tricuspid valve regurgitation is moderate.  6. The aortic valve was not well visualized. Aortic valve regurgitation  is not visualized. No aortic stenosis is present.   Patient Profile     64 y.o. female with a history of atrial fibrillation on Coumadin presented to the hospital with right MCA stroke status post endovascular thrombectomy with revascularization status post decompressive hemicraniectomy.  Assessment & Plan    1.  Permanent atrial fibrillation: Patient's heart rates have been better controlled.  Currently on 60 mg of diltiazem every 6 hours, 50 mg twice daily of metoprolol, and 400 mg twice daily.  At this point, since her rates are well  controlled, I do not feel that she needs the amiodarone and Brookelyn Gaynor stop it today.  Okay with heart rates up to 100-110 intermittently, though her heart rates have generally  been in the 70s to 37s.  When she is taking oral medications, if her heart rate stayed well controlled, would switch her diltiazem to 240 mg of the long-acting.  If she continues to have issues with rate control, and can increase that dose up to 360 mg.  Agree with continued anticoagulation with warfarin.  This point cardiology to sign off.  If further questions about rate control, up, please do not hesitate to call us back.  [facility] Paullette Mckain sign off.   Medication Recommendations: Metoprolol and diltiazem as above, stop amiodarone Other recommendations (labs, testing, etc): None Follow up as an outpatient: As previously scheduled  For questions or updates, please contact [place] Please consult [email or web address] for contact info under        Signed, Aryiah Monterosso Meredith Leeds, MD  11/11/2019, 7:44 AM

## 2019-11-12 DIAGNOSIS — I63411 Cerebral infarction due to embolism of right middle cerebral artery: Secondary | ICD-10-CM | POA: Diagnosis not present

## 2019-11-12 LAB — CBC
HCT: 32.2 % — ABNORMAL LOW (ref 36.0–46.0)
Hemoglobin: 10.3 g/dL — ABNORMAL LOW (ref 12.0–15.0)
MCH: 31.5 pg (ref 26.0–34.0)
MCHC: 32 g/dL (ref 30.0–36.0)
MCV: 98.5 fL (ref 80.0–100.0)
Platelets: 294 10*3/uL (ref 150–400)
RBC: 3.27 MIL/uL — ABNORMAL LOW (ref 3.87–5.11)
RDW: 15 % (ref 11.5–15.5)
WBC: 14.2 10*3/uL — ABNORMAL HIGH (ref 4.0–10.5)
nRBC: 0.8 % — ABNORMAL HIGH (ref 0.0–0.2)

## 2019-11-12 LAB — BASIC METABOLIC PANEL
Anion gap: 9 (ref 5–15)
BUN: 28 mg/dL — ABNORMAL HIGH (ref 8–23)
CO2: 31 mmol/L (ref 22–32)
Calcium: 8.4 mg/dL — ABNORMAL LOW (ref 8.9–10.3)
Chloride: 111 mmol/L (ref 98–111)
Creatinine, Ser: 0.89 mg/dL (ref 0.44–1.00)
GFR calc Af Amer: >60 mL/min (ref >60)
GFR calc non Af Amer: >60 mL/min (ref >60)
Glucose, Bld: 135 mg/dL — ABNORMAL HIGH (ref 70–99)
Potassium: 3.6 mmol/L (ref 3.5–5.1)
Sodium: 151 mmol/L — ABNORMAL HIGH (ref 135–145)

## 2019-11-12 LAB — GLUCOSE, CAPILLARY
Glucose-Capillary: 124 mg/dL — ABNORMAL HIGH (ref 70–99)
Glucose-Capillary: 149 mg/dL — ABNORMAL HIGH (ref 70–99)
Glucose-Capillary: 113 mg/dL — ABNORMAL HIGH (ref 70–99)
Glucose-Capillary: 146 mg/dL — ABNORMAL HIGH (ref 70–99)
Glucose-Capillary: 130 mg/dL — ABNORMAL HIGH (ref 70–99)
Glucose-Capillary: 155 mg/dL — ABNORMAL HIGH (ref 70–99)

## 2019-11-12 LAB — PHOSPHORUS: Phosphorus: 4 mg/dL (ref 2.5–4.6)

## 2019-11-12 LAB — CULTURE, BLOOD (ROUTINE X 2)
Culture: NO GROWTH
Culture: NO GROWTH
Special Requests: ADEQUATE
Special Requests: ADEQUATE

## 2019-11-12 LAB — MAGNESIUM: Magnesium: 2.5 mg/dL — ABNORMAL HIGH (ref 1.7–2.4)

## 2019-11-12 MED ORDER — PIPERACILLIN-TAZOBACTAM 3.375 G IVPB
3.3750 g | Freq: Three times a day (TID) | INTRAVENOUS | Status: AC
  Administered 2019-11-12 – 2019-11-18 (×20): 3.375 g via INTRAVENOUS
  Filled 2019-11-12 (×17): qty 50

## 2019-11-12 NOTE — Progress Notes (Signed)
Occupational Therapy Treatment Patient Details Name: Krystal Little MRN: [id number] DOB: 05/02/1956 Today's Date: 11/12/2019    History of present illness Pt is 64 y.o. female presented to [facility] ED as a code stroke for  Right gaze deviation and left side weakness. 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 Pt s/p R hemicraniectomy with bone flap placed in abdomen on 4/18.  PMH HTN, DM2, chronic a. Fib (coumadin) s/p pace maker.   OT comments  Pt following 1 step commands with visual attention < 25 % throughout session. Pt requires max (A) at times to open eyes by therapist. Pt worked on eob sitting with facilitation of trunk extension, cervical extension and anterior pelvic tilt. Recommending CIR. Spouse present throughout and encouraged by patient's engagement.    Follow Up Recommendations  CIR;Supervision/Assistance - 24 hour    Equipment Recommendations  Wheelchair (measurements OT);Wheelchair cushion (measurements OT)    Recommendations for Other Services PT consult;Rehab consult;Speech consult    Precautions / Restrictions Precautions Precautions: Fall Precaution Comments: R hemicraniectomy- skull in R abdomen       Mobility Bed Mobility Overal bed mobility: Needs Assistance Bed Mobility: Supine to Sit;Sit to Supine;Rolling Rolling: Max assist;+2 for physical assistance Sidelying to sit: Max assist;+2 for physical assistance Supine to sit: Max assist;+2 for physical assistance Sit to supine: Max assist;+2 for physical assistance   General bed mobility comments: Pt able to reach with R UE with cues but otherwise required max A x 2 for transfers  Transfers                 General transfer comment: Unable to attempt standing or pivots today - not able to maintain EOB balance    Balance Overall balance assessment: Needs assistance Sitting-balance support: Single extremity supported;Feet supported Sitting balance-Leahy Scale: Poor Sitting  balance - Comments: require (A) to sustain statics sitting. pt unable to sustain neck extension     Standing balance-Leahy Scale: Zero                             ADL either performed or assessed with clinical judgement   ADL Overall ADL's : Needs assistance/impaired     Grooming: Wash/dry face;Maximal assistance Grooming Details (indicate cue type and reason): told to wash face and washing only R eye                               General ADL Comments: pt requires max cues throughtout session to prompt visual attention to task. pt reachign adn pulling with R UE for mobility. pt unabl e to sustain. pt static sitting eob > 10 minutes. spouse present playing music. pt with positive response to music     Vision       Perception     Praxis      Cognition Arousal/Alertness: Lethargic Behavior During Therapy: Flat affect Overall Cognitive Status: Difficult to assess Area of Impairment: Following commands;Problem solving                       Following Commands: Follows one step commands inconsistently     Problem Solving: Slow processing;Decreased initiation;Difficulty sequencing;Requires verbal cues;Requires tactile cues General Comments: pt following 1 step commands with visual occlusion. pt with decreased ability to sustain eye opening. pt inconsistent with thumbs up and down. pt no verbalziations but spouse  reports verbalized 11/11/19.         Exercises General Exercises - Lower Extremity Ankle Circles/Pumps: AROM;Right;10 reps;Left;PROM Long Arc Quad: AROM;Right;5 reps;Seated Heel Slides: PROM;Left;10 reps Other Exercises Other Exercises: cervical rotation  Other Exercises: trunk rotation to reaching Other Exercises: L plantar flexion stretch 30 sec   Shoulder Instructions       General Comments VSS    Pertinent Vitals/ Pain       Pain Assessment: No/denies pain  Home Living                                           Prior Functioning/Environment              Frequency  Min 2X/week        Progress Toward Goals  OT Goals(current goals can now be found in the care plan section)  Progress towards OT goals: Progressing toward goals  Acute Rehab OT Goals Patient Stated Goal: to listen to music per spuse OT Goal Formulation: With family Time For Goal Achievement: 11/17/19 Potential to Achieve Goals: Good ADL Goals Pt Will Perform Grooming: with mod assist;sitting Pt Will Transfer to Toilet: with mod assist;stand pivot transfer;bedside commode Pt Will Perform Toileting - Clothing Manipulation and hygiene: with mod assist;sit to/from stand;sitting/lateral leans Additional ADL Goal #1: Pt will perform bed mobility with Min A +2 in preparation for ADLs Additional ADL Goal #2: Pt will tolerate sitting at EOB for 10 minute with Min A in preparation for ADLs Additional ADL Goal #3: Pt will follow one step commands with Mod cues during ADLs  Plan Discharge plan remains appropriate    Co-evaluation    PT/OT/SLP Co-Evaluation/Treatment: Yes Reason for Co-Treatment: Complexity of the patient's impairments (multi-system involvement);Necessary to address cognition/behavior during functional activity;For patient/therapist safety;To address functional/ADL transfers PT goals addressed during session: Mobility/safety with mobility;Balance OT goals addressed during session: ADL's and self-care;Proper use of Adaptive equipment and DME;Strengthening/ROM      AM-PAC OT "6 Clicks" Daily Activity     Outcome Measure   Help from another person eating meals?: Total Help from another person taking care of personal grooming?: A Lot Help from another person toileting, which includes using toliet, bedpan, or urinal?: Total Help from another person bathing (including washing, rinsing, drying)?: Total Help from another person to put on and taking off regular upper body clothing?: Total Help from another  person to put on and taking off regular lower body clothing?: Total 6 Click Score: 7    End of Session    OT Visit Diagnosis: Unsteadiness on feet (R26.81);Other abnormalities of gait and mobility (R26.89);Muscle weakness (generalized) (M62.81)   Activity Tolerance Patient tolerated treatment well   Patient Left in bed;with call bell/phone within reach;with family/visitor present;with bed alarm set   Nurse Communication Mobility status;Precautions        Time: 6606-3016 OT Time Calculation (min): 36 min  Charges: OT General Charges $OT Visit: 1 Visit OT Treatments $Neuromuscular Re-education: 8-22 mins   Brynn, OTR/L  Acute Rehabilitation Services Pager: [phone] Office: [phone] .    Jeri Modena 11/12/2019, 4:01 PM

## 2019-11-12 NOTE — Progress Notes (Signed)
Physical Therapy Treatment Patient Details Name: Krystal Little MRN: [id number] DOB: 1956/03/14 Today's Date: 11/12/2019    History of Present Illness Pt is 64 y.o. female presented to [facility] ED as a code stroke for  Right gaze deviation and left side weakness. 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 Pt s/p R hemicraniectomy with bone flap placed in abdomen on 4/18.  PMH HTN, DM2, chronic a. Fib (coumadin) s/p pace maker.    PT Comments    Pt continues to required max A x 2 for transfers to EOB.  Required max A at EOB for balance with assist for head/neck control.  Pt unable to attempt standing or pivots today.  She followed commands for exercises with R UE and LE.  Pt unable to vocalize and was inconsistent with thumbs up/down today for communication. L LE PROM remains WFL - no contractures or tone noted at this time.  Pt with slow progress, as expected with extensive CVA and hemicraniectomy.  Pts spouse present and encouraging pt.    Follow Up Recommendations  CIR;Supervision/Assistance - 24 hour     Equipment Recommendations  Hospital bed;Wheelchair (measurements PT);Wheelchair cushion (measurements PT);Other (comment)(hoyer; to be further assesed)    Recommendations for Other Services Rehab consult     Precautions / Restrictions Precautions Precautions: Fall Precaution Comments: R hemicraniectomy    Mobility  Bed Mobility Overal bed mobility: Needs Assistance Bed Mobility: Supine to Sit;Sit to Supine;Rolling Rolling: Max assist;+2 for physical assistance Sidelying to sit: Max assist;+2 for physical assistance   Sit to supine: Max assist;+2 for physical assistance   General bed mobility comments: Pt able to reach with R UE with cues but otherwise required max A x 2 for transfers  Transfers                 General transfer comment: Unable to attempt standing or pivots today - not able to maintain EOB balance  Ambulation/Gait                  Stairs             Wheelchair Mobility    Modified Rankin (Stroke Patients Only) Modified Rankin (Stroke Patients Only) Pre-Morbid Rankin Score: No symptoms Modified Rankin: Severe disability     Balance Overall balance assessment: Needs assistance Sitting-balance support: Single extremity supported;Feet supported Sitting balance-Leahy Scale: Poor Sitting balance - Comments: Pt required max A for EOB balance - required assist for trunk and to hold up head.  Worked on anterior pelvic tilt with towel to facilitate with OT, weight shifting and weight bearing on L UE, and activity with R side.  EOB for 15-20 mins.     Standing balance-Leahy Scale: Zero                              Cognition Arousal/Alertness: Lethargic Behavior During Therapy: Flat affect Overall Cognitive Status: Difficult to assess Area of Impairment: Following commands;Problem solving                       Following Commands: Follows one step commands inconsistently     Problem Solving: Slow processing;Decreased initiation;Difficulty sequencing;Requires verbal cues;Requires tactile cues General Comments: Difficult to assess as pt continues to not be able to keep eyes open and verablize. Pt following simple commands at times and using thumbs up and down to answer yes/no questions but inconsistent today.  Pt was not able to verbalize or answer questions about name or location tody. Following cues for exercises and visual tracking task. Will continue to assess      Exercises General Exercises - Lower Extremity Ankle Circles/Pumps: AROM;Right;10 reps;Left;PROM Long Arc Quad: AROM;Right;5 reps;Seated Heel Slides: PROM;Left;10 reps Other Exercises Other Exercises: L plantar flexion stretch 30 sec    General Comments General comments (skin integrity, edema, etc.): VSS on RA      Pertinent Vitals/Pain Pain Assessment: No/denies pain    Home Living                       Prior Function            PT Goals (current goals can now be found in the care plan section) Acute Rehab PT Goals Patient Stated Goal: Get better; per daughter PT Goal Formulation: With family Time For Goal Achievement: 11/17/19 Potential to Achieve Goals: Good Progress towards PT goals: Progressing toward goals    Frequency    Min 3X/week      PT Plan Current plan remains appropriate    Co-evaluation PT/OT/SLP Co-Evaluation/Treatment: Yes Reason for Co-Treatment: Complexity of the patient's impairments (multi-system involvement);For patient/therapist safety PT goals addressed during session: Mobility/safety with mobility;Balance OT goals addressed during session: ADL's and self-care      AM-PAC PT "6 Clicks" Mobility   Outcome Measure  Help needed turning from your back to your side while in a flat bed without using bedrails?: Total Help needed moving from lying on your back to sitting on the side of a flat bed without using bedrails?: Total Help needed moving to and from a bed to a chair (including a wheelchair)?: Total Help needed standing up from a chair using your arms (e.g., wheelchair or bedside chair)?: Total Help needed to walk in hospital room?: Total Help needed climbing 3-5 steps with a railing? : Total 6 Click Score: 6    End of Session   Activity Tolerance: Patient limited by lethargy Patient left: in bed;with call bell/phone within reach;with bed alarm set;with family/visitor present Nurse Communication: Mobility status PT Visit Diagnosis: Difficulty in walking, not elsewhere classified (R26.2)     Time: 1275-1700 PT Time Calculation (min) (ACUTE ONLY): 37 min  Charges:  $Therapeutic Activity: 8-22 mins                     Maggie Font, PT Acute Rehab Services Pager [phone] [facility] [phone] [facility] [place] 11/12/2019, 1:51 PM

## 2019-11-12 NOTE — Progress Notes (Signed)
Inpatient Rehabilitation Admissions Coordinator  I met at bedside with patient's spouse with pt sleeping. We discussed goals and expectations of a possible inpt rehab admit . I will follow her progress as we await further abilities to participate with therapies.  Danne Baxter, RN, MSN Rehab Admissions Coordinator [phone] 11/12/2019 3:26 PM

## 2019-11-12 NOTE — Progress Notes (Addendum)
STROKE TEAM PROGRESS NOTE   INTERVAL HISTORY Patient is sitting up in bed. CXR still with infiltrates, received IV lasix. Vital signs are stable. More somnolent today.  Her husband is at the bedside OBJECTIVE Vitals:   11/12/19 0821 11/12/19 1049 11/12/19 1103 11/12/19 1221  BP: 123/69 (!) 118/55 127/63 120/70  Pulse: 99 63 65   Resp:      Temp:      TempSrc:      SpO2: 96% 99% 97%   Weight:      Height:        CBC:  Recent Labs  Lab 11/11/19 0454 11/12/19 0445  WBC 16.3* 14.2*  HGB 9.9* 10.3*  HCT 31.8* 32.2*  MCV 97.8 98.5  PLT 231 161   Basic Metabolic Panel:  Recent Labs  Lab 11/11/19 0454 11/12/19 0445  NA 149* 151*  K 3.9 3.6  CL 110 111  CO2 30 31  GLUCOSE 186* 135*  BUN 32* 28*  CREATININE 0.86 0.89  CALCIUM 8.2* 8.4*  MG 2.8* 2.5*  PHOS 3.5 4.0   IMAGING past 24h  DG Chest 2 View  Result Date: 11/11/2019 CLINICAL DATA:  Shortness of breath EXAM: CHEST - 2 VIEW COMPARISON:  11/09/2019, 11/08/2019, 11/07/2019 FINDINGS: Esophageal tube tip is below the diaphragm but incompletely visualized. Post sternotomy changes. Similar positioning of right-sided pacing device. Enlarged cardiomediastinal silhouette. Small right and moderate left pleural effusion. Similar dense airspace disease at the left base. Effusion on the left may be slightly increased. IMPRESSION: 1. Esophageal tube tip below the diaphragm but incompletely visualized 2. Left greater than right pleural effusion, possibly slightly increased on the left side 3. Dense airspace disease at the left base without change 4. Mild cardiomegaly Electronically Signed   By: Donavan Foil M.D.   On: 11/11/2019 21:21     PHYSICAL EXAM      Middle-aged African-American lady not in distress.  Sitting up in bed. . Afebrile. Head is nontraumatic. Neck is supple without bruit.    Cardiac exam irregular heart sounds no murmur or gallop. Lungs are clear to auscultation. Distal pulses are well felt.   Neurological Exam :  She remains with eyes closed during exam and is difficult to arouse but did follow one command on the right side. 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 Her voice is extremely hypophonic and difficult to hear. Follows commands on the right side but has left hemiplegia with eye forced opening, eyes still in right gaze position, not blinking to visual threat on left. Bilateral pupil 7mm, sluggish to light. Corneal reflex presen. Tngue protrusion not cooperative. On pain stimulation, RUE and RLE mild withdraw 2/5, LUE extension posturing and LLE slight withdraw. DTR 1+ and bilateral upgoing toes. Sensation, coordination and gait not tested.   ASSESSMENT/PLAN [name] is a 64 y.o. female with history of HTN, DM2, hx of mitral valve repair, chronic a. Fib (coumadin - INR 1.2 on admission) s/p pacemaker who presented to [facility] ED as a code stroke for right gaze deviation, urinary incontinence and left sided weakness. She did not receive IV t-PA due to anticoagulation with coumadin. IR -> ICA terminus occlusion. Mechanical thrombectomy performed with 2 stent retriever + aspiration passes in the MCA-ICA (solitaire and embotrap), 1 stent retriever + aspiration pass MCA (embotrap) and 1 direct contact aspiration in the right ACA. Complete recanalization achieved (TICI3).  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 Hyperdense right MCA suggesting extensive thrombus from the right ICA terminus through the MCA bifurcation Some mass effect with partial effacement the sulci on the right. No midline shift. ASPECTS is 4/10   CTA H&N - Total occlusion of right ICA terminus. Thrombus extends through the right MCA bifurcation with poor collaterals.   CT Perfusion - Confirmed large right MCA territory infarct with core  volume of 107 mL. Large mismatch volume of 9.6 with a mismatch ratio of 1.9.   CT head -  Large area of hypoattenuation within the right MCA territory, consistent with subacute infarct. Multifocal hyperdensity within the right MCA territory is consistent with hemorrhage or contrast staining. The more peripheral hyperdensities are favored to be hemorrhagic. 6 mm leftward midline shift  CT Head 11/02/19 - Worsened edema within the right MCA territory with increased size of right basal ganglia intraparenchymal hematoma. Increased amount of subarachnoid blood over the right hemisphere. 11 mm of leftward midline shift  MRI head - not performed - PPM   CT head 4/20 postop changed from R crani. Evolution large R MCA infarct w/ interval increase edema/swelling. Increased petechial hemorrhage throughout. SAH also increased. R BG HT slightly increased. Mass effect, partial effacement ventricles w/ 24mm L midline shift   CT 4/22 stable large R MCA infarct w/ diffuse petechial hemorrhage   CT Head 11/09/19 - Continued interval evolution of large right MCA territory infarct with associated hemorrhagic transformation, relatively stable as compared to 11/06/2019. No evidence for interval hemorrhage or re-bleeding. Associated mass effect with up to 12 mm of right-to-left shift not significantly changed. Stable asymmetric dilatation of the left lateral ventricle.  2D Echo - EF 50-55%. No source of embolus. 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   Sars Corona Virus 2 - negative  LDL - 158  HgbA1c - 9.7  UDS - negative  VTE prophylaxis - SCDs  warfarin daily prior to admission, now on No antithrombotic  Therapy recommendations:  CIR - admission coordinator following. Pre-Cert needed for insuraced  Disposition:  Pending  Acute hypoxemia respiratory failure d/t stroke  Intubated for emergent hemicraniectomy for cerebral edema  Now extubated  CXR 4/20 - Complete opacification of the  retrocardiac left lung base worrisome for left lower lobe atelectasis.  CXR 4/22 B pulm infiltrates / edema and B pleural effusions  CXR 4/23 - Bilateral pleural effusions, right greater than left, with associated opacity, likely atelectasis.  CXR 11/09/19 - Stable small right effusion with underlying atelectasis. Stableeft retrocardiac opacity. The small layering left effusion seen previously is less prominent.  CXR 4/27: pleural effusions. Pt tachyonic, but no acute distress  Lasix 40 IV given x1  Cerebral edema w/ subfalcine herniation s/p decompressive hemicraniectomy  CT 4/22 stable large R MCA infarct w/ diffuse petechial hemorrhage  3% saline @ 50 cc/hr -> NS @ 50 -> off  Na 138->...-151-150-150->148->148->151  NSG on board s/p [facility] 4/18  PICC line placed  23.4% x 2  Now >7 days out, will allow Na to gradually trend down.  Atrial fibrillation w/ RVR  Telemetry showed A. fib, rate controlled  On Coumadin PTA, family stated compliance  Subtherapeutic INR 1.2 on presentation -> 1.4  No AC at the time due to large infarct and hemorrhagic conversion (previously refused DOACs)  metoprolol 50 bid    On cardizem 60 q 8h->60 q 6h  amiodarone gtt converted to  po 4/22 - 400 bid x 1 week then 400 daily  EP Cardiology on board  Hypertension->Hypotension  Home BP meds: Atenolol; Cardizem ; lisinopril  Current BP meds: cardizem, metoprolol 50 bid  Off cleviprex  lisinopril d/c due to low BP  BP improved  Resume metoprolol 25 bid -> 50 bid . SBP goal < 160 mm Hg . Long-term BP goal normotensive  Hyperlipidemia  Home Lipid lowering medication: zetia  LDL 158, goal < 70  Hold off statin for know due to left hemorrhagic conversion  Consider statin at discharge  Diabetes, uncontrolled  Home diabetic meds: metformin  HgbA1c 9.7, goal < 7.0  CBG monitoring  Hyperglycemia - on lantus 25 bid, novolog 7u q4h     Continue SSI 0-20  DM coordinator  consulted  Anemia and thrombocytopenia, improving  Platelet 157    Hb 10.3    Close monitoring  Dysphagia  NPO  Has NG  On TF @ 44  Speech following  Other Stroke Risk Factors  Advanced age  ETOH use, advised to drink no more than 1 alcoholic beverage per day.  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 Will start Zosyn IV  Add amandtadine to assist with wakeful state and rehab      Continue tube feeds for now.  Mobilize out of bed.  Continue ongoing therapies.  She continues to have low-grade fever and climbing WBCs. Increased HR and some dyspnoea noted. CXR with infiltrates again. Fear she may be getting aspiration pna. Will start Zosyn. UA and  blood cultures neg. Appreciate cardiology help.    Hospital day # [address] Metzger-Cihelka, ARNP-C, ANVP-BC Pager: [phone] I have personally obtained history,examined this patient, reviewed notes, independently viewed imaging studies, participated in medical decision making and plan of care.ROS completed by me personally and pertinent positives fully documented  I have made any additions or clarifications directly to the above note. Agree with note above.  Long discussion with husband and answered questions.  Await transfer to rehab when bed available in the next few days.  Greater than 50% time during this 25-minute visit was spent in counseling and coordination of care and discussion with care team.  Antony Contras, MD Medical Director [place] Pager: [phone] 11/12/2019 3:33 PM  To contact Stroke Continuity provider, please refer to [email or web address] After hours, contact General  Neurology

## 2019-11-13 DIAGNOSIS — I63411 Cerebral infarction due to embolism of right middle cerebral artery: Secondary | ICD-10-CM | POA: Diagnosis not present

## 2019-11-13 LAB — GLUCOSE, CAPILLARY
Glucose-Capillary: 153 mg/dL — ABNORMAL HIGH (ref 70–99)
Glucose-Capillary: 152 mg/dL — ABNORMAL HIGH (ref 70–99)
Glucose-Capillary: 102 mg/dL — ABNORMAL HIGH (ref 70–99)
Glucose-Capillary: 129 mg/dL — ABNORMAL HIGH (ref 70–99)
Glucose-Capillary: 171 mg/dL — ABNORMAL HIGH (ref 70–99)

## 2019-11-13 LAB — CBC
HCT: 32.6 % — ABNORMAL LOW (ref 36.0–46.0)
Hemoglobin: 10 g/dL — ABNORMAL LOW (ref 12.0–15.0)
MCH: 30.5 pg (ref 26.0–34.0)
MCHC: 30.7 g/dL (ref 30.0–36.0)
MCV: 99.4 fL (ref 80.0–100.0)
Platelets: 294 10*3/uL (ref 150–400)
RBC: 3.28 MIL/uL — ABNORMAL LOW (ref 3.87–5.11)
RDW: 15.5 % (ref 11.5–15.5)
WBC: 12.7 10*3/uL — ABNORMAL HIGH (ref 4.0–10.5)
nRBC: 0.7 % — ABNORMAL HIGH (ref 0.0–0.2)

## 2019-11-13 NOTE — Consult Note (Signed)
[facility] Surgery Consult Note  Krystal Little Feb 05, 1956  [id number].    Requesting MD: Antony Contras Chief Complaint/Reason for Consult: PEG consult  HPI:  Krystal Little is a 64yo female PMH HTN, DM, hx mitral valve repair, chronic A fib on coumadin, s/p pacemaker, who was admitted to [facility] 10/31/2019 after suffering an acute stroke. She presented to the ED with right gaze deviation, urinary incontinence and left sided weakness. 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 She did not receive IV t-PA due to anticoagulation with coumadin. She underwent decompressive right hemicraniectomy, placement of bone flap into abdominal subcutaneous pocket 11/02/2019 by Dr. Kathyrn Sheriff. She has made very slow improvements from neurological standpoint. She has worked with speech therapy and remains an aspiration risk. She is currently tolerating tube feedings via NG. Family would like to continue aggressive care, and trauma was asked to see for consideration of PEG tube.  -Abdominal surgical history: cholecystectomy, hernia repair, placement bone flap into abdominal subcutaneous pocket 11/02/2019  ROS: Review of Systems  Unable to perform ROS: Patient nonverbal   All systems reviewed and otherwise negative except for as above  Family History  Problem Relation Age of Onset  . Hypertension Mother   . Diabetes Sister   . Cancer Brother        Rectal  . Lung cancer Brother     Past Medical History:  Diagnosis Date  . Chronic atrial fibrillation ([place]) 1990s   s/p DCCV then attempted ablation of complex A Flutter (@ [facility] - Dr. Deno Etienne), failed antiarrhythmics --> INR folllwed @ [facility] ->> now status post pacemaker placement with underlying A. fib.  . Diabetes mellitus type 2, uncontrolled, without complications    On Oral Medications ([facility])  . Essential hypertension   . History of cardiac catheterization 2001   R&LHC - normal Coronaries, no evidence of Restictive Cardiomyopathy or  Constrictive Pericarditis (also @ [facility])  . Hx of sick sinus syndrome 07/1999   Wtih symptomatic bradycardia - syncope ([place])  . 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 CARDIAC CATHETERIZATION  08/03/1999   [place] - normal Coronaries; no sign of constriction or restrictive cardiomypathy  . CRANIECTOMY FOR DEPRESSED SKULL FRACTURE Right 11/02/2019   Procedure: DECOMPRESSIVE HEMI -CRANIECTOMY, IMPLANTATION OF SKULL FLAP TO RIGHT ABDOMEN;  Surgeon: Consuella Lose, MD;  Location: [place];  Service: Neurosurgery;  Laterality: Right;  . IR CT HEAD LTD  10/31/2019  . IR PERCUTANEOUS ART THROMBECTOMY/INFUSION INTRACRANIAL INC DIAG ANGIO  10/31/2019      . IR PERCUTANEOUS ART THROMBECTOMY/INFUSION INTRACRANIAL INC DIAG ANGIO  10/31/2019  . MITRAL VALVE REPAIR  07/1999   Both Ant& Post leaflet repair - quadrangular resection&caudal transposition, #32 Sequin Annuloplasty ring  . PACEMAKER GENERATOR CHANGE  11/26/2008   Medtronic Adapta L(? if it has been checked since 02/2013)  . PACEMAKER INSERTION  07/1999   for SSS in setting of Chronic Afib; Medtronic Kappa [id number], [id number].  Marland Kitchen RADIOLOGY WITH ANESTHESIA N/A 10/31/2019   Procedure: IR WITH ANESTHESIA;  Surgeon: Radiologist, Medication, MD;  Location:  [facility];  Service: Radiology;  Laterality: N/A;  . TRANSTHORACIC ECHOCARDIOGRAM  05/2018   Normal LV size and thickness.  EF estimated 50%.  Inferobasal HK and flattened septum c/w with elevated PA pressures. Mild functional mitral stenosis at rest (postoperative).  Moderate left atrial  dilation and mild right atrial dilation.  Mean PA pressures estimated 52 mmHg.    Social History:  reports that she has never smoked. She has never used smokeless tobacco. She reports current alcohol use. She reports that she does not use drugs.  Allergies: No Known Allergies  Medications Prior to Admission  Medication Sig Dispense Refill  . acetaminophen (TYLENOL) 500 MG tablet Take 500-1,000 mg by mouth every 6 (six) hours as needed for headache (pain).    Marland Kitchen atenolol (TENORMIN) 50 MG tablet TAKE 1 TABLET(50 MG) BY MOUTH DAILY (Patient taking differently: Take 50 mg by mouth daily. ) 90 tablet 0  . CHROMIUM PO Take 1 tablet by mouth daily.    Marland Kitchen diltiazem (CARDIZEM CD) 180 MG 24 hr capsule TAKE 1 CAPSULE(180 MG) BY MOUTH DAILY (Patient taking differently: Take 180 mg by mouth daily. ) 90 capsule 2  . ezetimibe (ZETIA) 10 MG tablet Take 10 mg by mouth daily.    Haydee Salter Leaf POWD Take 1 capsule by mouth daily. "sugar destroyer"    . lisinopril (ZESTRIL) 5 MG tablet TAKE 1 TABLET(5 MG) BY MOUTH DAILY (Patient taking differently: Take 5 mg by mouth daily. ) 90 tablet 0  . metFORMIN (GLUCOPHAGE) 1000 MG tablet Take 1,000 mg by mouth 2 (two) times daily.    . Multiple Vitamin (MULTIVITAMIN WITH MINERALS) TABS tablet Take 1 tablet by mouth daily.    Marland Kitchen warfarin (COUMADIN) 5 MG tablet TAKE 1 TABLET BY MOUTH DAILY EXCEPT TAKE 1 AND 1/2 TABLET ON MONDAY AND FRIDAY (Patient taking differently: Take 5 mg by mouth See admin instructions. Take 1 1/2 tablets (7.5 mg) by mouth on Monday and Friday, take 1 tablet (5 mg) on all other days of the week) 45 tablet 3    Prior to Admission medications   Medication Sig Start Date End Date Taking? Authorizing Provider  acetaminophen (TYLENOL) 500 MG tablet Take 500-1,000 mg by mouth every 6 (six) hours as needed for headache (pain).   Yes [provider]  atenolol (TENORMIN) 50 MG tablet TAKE 1 TABLET(50 MG) BY MOUTH DAILY Patient taking  differently: Take 50 mg by mouth daily.  09/26/19  Yes Leonie Man, MD  CHROMIUM PO Take 1 tablet by mouth daily.   Yes [provider]  diltiazem (CARDIZEM CD) 180 MG 24 hr capsule TAKE 1 CAPSULE(180 MG) BY MOUTH DAILY Patient taking differently: Take 180 mg by mouth daily.  09/26/19  Yes Leonie Man, MD  ezetimibe (ZETIA) 10 MG tablet Take 10 mg by mouth daily.   Yes [provider]  Gymnema Sylvestris Leaf POWD Take 1 capsule by mouth daily. "sugar destroyer"   Yes [provider]  lisinopril (ZESTRIL) 5 MG tablet TAKE 1 TABLET(5 MG) BY MOUTH DAILY Patient taking differently: Take 5 mg by mouth daily.  08/26/19  Yes Leonie Man, MD  metFORMIN (GLUCOPHAGE) 1000 MG tablet Take 1,000 mg by mouth 2 (two) times daily. 09/12/19  Yes [provider]  Multiple Vitamin (MULTIVITAMIN WITH MINERALS) TABS tablet Take 1 tablet by mouth daily.   Yes [provider]  warfarin (COUMADIN) 5 MG tablet TAKE 1 TABLET BY MOUTH DAILY EXCEPT TAKE 1 AND  1/2 TABLET ON MONDAY AND FRIDAY Patient taking differently: Take 5 mg by mouth See admin instructions. Take 1 1/2 tablets (7.5 mg) by mouth on Monday and Friday, take 1 tablet (5 mg) on all other days of the week 08/05/18  Yes Orson Eva J, DO    Blood pressure 116/68, pulse 97, temperature 98.1 F (36.7 C), temperature source Axillary, resp. rate 13, height 5\' 4"  (1.626 m), weight 60.5 kg, SpO2 100 %. Physical Exam: General: WD/WN black female who is laying in bed in NAD HEENT: multiple incisions with dressings cdi, decompressed skull without bone flap.  Sclera are noninjected.  Will open eyes, pupils equal and round.  Ears and nose without any masses or lesions.  Mouth is dry. Dentition fair Heart: irregular.  Palpable pedal pulses bilaterally  Lungs: CTAB, no wheezes, rhonchi, or rales noted.  Respiratory effort nonlabored Abd: RUQ incision cdi with staples intact and no erythema or drainage. previous well  healed laparoscopic incisions cdi. soft, NT/ND, +BS, no hernias or organomegaly. Palpable bone flap RUQ MS: no BUE/BLE edema, calves soft and nontender Skin: warm and dry with no masses, lesions, or rashes Psych: unable to assess Neuro: nonverbal, opens eyes, moves LUE/LLE on pain stimulation  Results for orders placed or performed during the hospital encounter of 10/31/19 (from the past 48 hour(s))  Glucose, capillary     Status: Abnormal   Collection Time: 11/11/19  8:04 PM  Result Value Ref Range   Glucose-Capillary 127 (H) 70 - 99 mg/dL    Comment: Glucose reference range applies only to samples taken after fasting for at least 8 hours.  Brain natriuretic peptide     Status: None   Collection Time: 11/11/19 10:26 PM  Result Value Ref Range   B Natriuretic Peptide 93.9 0.0 - 100.0 pg/mL    Comment: Performed at [facility] [address], [place], [place] [address]  Glucose, capillary     Status: Abnormal   Collection Time: 11/11/19 11:03 PM  Result Value Ref Range   Glucose-Capillary 149 (H) 70 - 99 mg/dL    Comment: Glucose reference range applies only to samples taken after fasting for at least 8 hours.  CBC     Status: Abnormal   Collection Time: 11/12/19  4:45 AM  Result Value Ref Range   WBC 14.2 (H) 4.0 - 10.5 K/uL   RBC 3.27 (L) 3.87 - 5.11 MIL/uL   Hemoglobin 10.3 (L) 12.0 - 15.0 g/dL   HCT 32.2 (L) 36.0 - 46.0 %   MCV 98.5 80.0 - 100.0 fL   MCH 31.5 26.0 - 34.0 pg   MCHC 32.0 30.0 - 36.0 g/dL   RDW 15.0 11.5 - 15.5 %   Platelets 294 150 - 400 K/uL   nRBC 0.8 (H) 0.0 - 0.2 %    Comment: Performed at [facility] [address] [place], [place] [address]  Basic metabolic panel     Status: Abnormal   Collection Time: 11/12/19  4:45 AM  Result Value Ref Range   Sodium 151 (H) 135 - 145 mmol/L   Potassium 3.6 3.5 - 5.1 mmol/L   Chloride 111 98 - 111 mmol/L   CO2 31 22 - 32 mmol/L   Glucose, Bld 135 (H) 70 - 99 mg/dL    Comment: Glucose reference  range applies only to samples taken after fasting for at least 8 hours.   BUN 28 (H) 8 - 23 mg/dL   Creatinine, Ser 0.89 0.44 -  1.00 mg/dL   Calcium 8.4 (L) 8.9 - 10.3 mg/dL   GFR calc non Af Amer >60 >60 mL/min   GFR calc Af Amer >60 >60 mL/min   Anion gap 9 5 - 15    Comment: Performed at [facility] [address], [place], [place] [address]  Magnesium     Status: Abnormal   Collection Time: 11/12/19  4:45 AM  Result Value Ref Range   Magnesium 2.5 (H) 1.7 - 2.4 mg/dL    Comment: Performed at [facility] [address] [place], [place] [address]  Phosphorus     Status: None   Collection Time: 11/12/19  4:45 AM  Result Value Ref Range   Phosphorus 4.0 2.5 - 4.6 mg/dL    Comment: Performed at [facility] [address], [place], [place] [address]  Glucose, capillary     Status: Abnormal   Collection Time: 11/12/19  4:58 AM  Result Value Ref Range   Glucose-Capillary 124 (H) 70 - 99 mg/dL    Comment: Glucose reference range applies only to samples taken after fasting for at least 8 hours.  Glucose, capillary     Status: Abnormal   Collection Time: 11/12/19  7:57 AM  Result Value Ref Range   Glucose-Capillary 149 (H) 70 - 99 mg/dL    Comment: Glucose reference range applies only to samples taken after fasting for at least 8 hours.  Glucose, capillary     Status: Abnormal   Collection Time: 11/12/19 12:34 PM  Result Value Ref Range   Glucose-Capillary 113 (H) 70 - 99 mg/dL    Comment: Glucose reference range applies only to samples taken after fasting for at least 8 hours.  Glucose, capillary     Status: Abnormal   Collection Time: 11/12/19  4:06 PM  Result Value Ref Range   Glucose-Capillary 146 (H) 70 - 99 mg/dL    Comment: Glucose reference range applies only to samples taken after fasting for at least 8 hours.  Glucose, capillary     Status: Abnormal   Collection Time: 11/12/19  8:34 PM  Result Value Ref Range   Glucose-Capillary 130 (H) 70 - 99  mg/dL    Comment: Glucose reference range applies only to samples taken after fasting for at least 8 hours.   Comment 1 Notify RN    Comment 2 Document in Chart   Glucose, capillary     Status: Abnormal   Collection Time: 11/12/19 11:21 PM  Result Value Ref Range   Glucose-Capillary 155 (H) 70 - 99 mg/dL    Comment: Glucose reference range applies only to samples taken after fasting for at least 8 hours.   Comment 1 Notify RN    Comment 2 Document in Chart   Glucose, capillary     Status: Abnormal   Collection Time: 11/13/19  3:38 AM  Result Value Ref Range   Glucose-Capillary 153 (H) 70 - 99 mg/dL    Comment: Glucose reference range applies only to samples taken after fasting for at least 8 hours.   Comment 1 Notify RN    Comment 2 Document in Chart   CBC     Status: Abnormal   Collection Time: 11/13/19  5:00 AM  Result Value Ref Range   WBC 12.7 (H) 4.0 - 10.5 K/uL   RBC 3.28 (L) 3.87 - 5.11 MIL/uL   Hemoglobin 10.0 (L) 12.0 - 15.0 g/dL   HCT 32.6 (L) 36.0 - 46.0 %  MCV 99.4 80.0 - 100.0 fL   MCH 30.5 26.0 - 34.0 pg   MCHC 30.7 30.0 - 36.0 g/dL   RDW 15.5 11.5 - 15.5 %   Platelets 294 150 - 400 K/uL   nRBC 0.7 (H) 0.0 - 0.2 %    Comment: Performed at [facility] [address], [place], [place] [address]  Glucose, capillary     Status: Abnormal   Collection Time: 11/13/19  8:23 AM  Result Value Ref Range   Glucose-Capillary 152 (H) 70 - 99 mg/dL    Comment: Glucose reference range applies only to samples taken after fasting for at least 8 hours.  Glucose, capillary     Status: Abnormal   Collection Time: 11/13/19 11:33 AM  Result Value Ref Range   Glucose-Capillary 102 (H) 70 - 99 mg/dL    Comment: Glucose reference range applies only to samples taken after fasting for at least 8 hours.  Glucose, capillary     Status: Abnormal   Collection Time: 11/13/19  3:58 PM  Result Value Ref Range   Glucose-Capillary 129 (H) 70 - 99 mg/dL    Comment: Glucose reference  range applies only to samples taken after fasting for at least 8 hours.   DG Chest 2 View  Result Date: 11/11/2019 CLINICAL DATA:  Shortness of breath EXAM: CHEST - 2 VIEW COMPARISON:  11/09/2019, 11/08/2019, 11/07/2019 FINDINGS: Esophageal tube tip is below the diaphragm but incompletely visualized. Post sternotomy changes. Similar positioning of right-sided pacing device. Enlarged cardiomediastinal silhouette. Small right and moderate left pleural effusion. Similar dense airspace disease at the left base. Effusion on the left may be slightly increased. IMPRESSION: 1. Esophageal tube tip below the diaphragm but incompletely visualized 2. Left greater than right pleural effusion, possibly slightly increased on the left side 3. Dense airspace disease at the left base without change 4. Mild cardiomegaly Electronically Signed   By: Donavan Foil M.D.   On: 11/11/2019 21:21    Anti-infectives (From admission, onward)   Start     Dose/Rate Route Frequency Ordered Stop   11/12/19 1515  piperacillin-tazobactam (ZOSYN) IVPB 3.375 g     3.375 g 12.5 mL/hr over 240 Minutes Intravenous Every 8 hours 11/12/19 1500     11/10/19 1656  ceFEPIme (MAXIPIME) 2 g in sodium chloride 0.9 % 100 mL IVPB  Status:  Discontinued     2 g 200 mL/hr over 30 Minutes Intravenous Every 12 hours 11/10/19 0711 11/10/19 1341   11/08/19 1100  ceFEPIme (MAXIPIME) 2 g in sodium chloride 0.9 % 100 mL IVPB  Status:  Discontinued     2 g 200 mL/hr over 30 Minutes Intravenous Every 8 hours 11/08/19 1032 11/10/19 0711   11/02/19 0854  bacitracin 50,000 Units in sodium chloride 0.9 % 500 mL irrigation  Status:  Discontinued       As needed 11/02/19 0854 11/02/19 1006       Assessment/Plan HTN DM Hx mitral valve repair Chronic A fib on coumadin - hold upon admission S/p pacemaker  Large right MCA infarct Dysphagia - s/p R hemicraniectomy with bone flap placed in abdomen on 4/18 - Spoke with husband at bedside. Can plan for PEG  placement early next weekend, tentatively Monday 11/17/2019. Continue tube feedings and working with SLP for now.  ID - currently zosyn 4/28>> for PNA VTE - SCDs, lovenox FEN - NPO, TF Foley - none Follow up - TBD  Wellington Hampshire, [facility] Surgery 11/13/2019, 4:18  PM Please see Amion for pager number during day hours 7:00am-4:30pm

## 2019-11-13 NOTE — Progress Notes (Addendum)
  Speech Language Pathology Treatment:    Patient Details Name: Krystal Little MRN: [id number] DOB: 09-Feb-1956 Today's Date: 11/13/2019 Time: 0626-9485 SLP Time Calculation (min) (ACUTE ONLY): 18 min  Assessment / Plan / Recommendation Clinical Impression  Krystal Little was seen for skilled PO trials/PO readiness. Husband was present at beginning of session. Pt required mod-max cues for alertness. Oral care was provided. Pt was seen with small sip from cup of water with immediate weak cough response. She was cued to cough harder and was able to cough on demand, however, never brought up anything with the cough. She was seen with small ice chip trials with immediate cough response noted in 3/4 trials. Pt is initiating a swallow and shows improved alertness and participation. Recommend objective swallow study (MBSS) to assess swallowing physiology and determine if any consistency can be safely consumed for therapy. Pt/husband were educated on plan. Husband verbalized understanding and agreement; pt did a "thumbs up" to confirm. Of note, pt was noted to be responsive t/o session both verbally and with gestures. She stated her name and made approximations of her husband and family's names.     HPI HPI: 64 y.o. female presented to [facility] ED as a code stroke for R gaze deviation and L-sided weakness. CT revealed a large R MCA infarct. Pt s/p R hemicraniectomy with bone flap placed in abdomen on 4/18. ETT 4/18-4/25. PMH HTN, DM2, chronic a. Fib (coumadin) s/p pace maker. Continues with chest infiltrates.      SLP Plan  MBS;Continue with current plan of care;New goals to be determined pending instrumental study        Recommendations  Diet recommendations: NPO Medication Administration: Via alternative means         Oral Care Recommendations: Oral care QID Follow up Recommendations: Inpatient Rehab SLP Visit Diagnosis: Dysphagia, unspecified (R13.10) Plan: MBS;Continue with current plan of  care;New goals to be determined pending instrumental study                  Citlalic Norlander P. Brycin Kille, M.S., [name] Pathologist Acute Rehabilitation Services Pager: [place] 11/13/2019, 2:39 PM

## 2019-11-13 NOTE — Progress Notes (Addendum)
STROKE TEAM PROGRESS NOTE   INTERVAL HISTORY Her husband is at the bedside. Pt is more alert today, following commands slowly.  He continues to want full aggressive care. She will need PEG placement for d/c to facility for rehab. Trauma consulted for this.  OBJECTIVE Vitals:   11/13/19 0500 11/13/19 0608 11/13/19 0828 11/13/19 1300  BP:  (!) 121/57 116/68   Pulse: 65 63 97   Resp: (!) 28 (!) 22 (!) 24 13  Temp:  98.7 F (37.1 C) (!) 97.1 F (36.2 C) 98.1 F (36.7 C)  TempSrc:  Oral Axillary Axillary  SpO2: 100% 99% 100%   Weight:      Height:        CBC:  Recent Labs  Lab 11/12/19 0445 11/13/19 0500  WBC 14.2* 12.7*  HGB 10.3* 10.0*  HCT 32.2* 32.6*  MCV 98.5 99.4  PLT 294 938   Basic Metabolic Panel:  Recent Labs  Lab 11/11/19 0454 11/12/19 0445  NA 149* 151*  K 3.9 3.6  CL 110 111  CO2 30 31  GLUCOSE 186* 135*  BUN 32* 28*  CREATININE 0.86 0.89  CALCIUM 8.2* 8.4*  MG 2.8* 2.5*  PHOS 3.5 4.0   IMAGING past 24h  No results found.   PHYSICAL EXAM      Middle-aged African-American lady not in distress. Afebrile. Head is traumatic with multiple incisions and dressings and a decompressed skull w/o bone flap. Dressings are melded to head d/t hematoma drainage that is now crusted. Unable to remove by RN as it is entangled with incision. Cardiac exam irregular heart sounds no murmur or gallop. Lungs are clear to auscultation.  Distal pulses are well felt.  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 Eyes still in right gaze position, not blinking to visual threat on left.  Mostly remains with eyes closed during exam. She has considerable psycho-motor slowing, but will follow simple commands on the right side. Her voice is extremely hypophonic and difficult to hear. Dysarthric. Tngue protrusion not cooperative. On pain stimulation,  LUE and LLE slight withdraw. DTR 1+ and bilateral upgoing toes. coordination and gait not  tested.  ASSESSMENT/PLAN Krystal Little is a 64 y.o. female with history of HTN, DM2, hx of mitral valve repair, chronic a. Fib (coumadin - INR 1.2 on admission) s/p pacemaker who presented to [facility] ED as a code stroke for right gaze deviation, urinary incontinence and left sided weakness. She did not receive IV t-PA due to anticoagulation with coumadin. IR -> ICA terminus occlusion. Mechanical thrombectomy performed with 2 stent retriever + aspiration passes in the MCA-ICA (solitaire and embotrap), 1 stent retriever + aspiration pass MCA (embotrap) and 1 direct contact aspiration in the right ACA. Complete recanalization achieved (TICI3).  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 Cannot exclude etiology as embolic secondary to atrial fibrillation on warfarin with subtherapeutic INR  CT Head -  Large right MCA territory nonhemorrhagic infarct is described. Hyperdense right MCA suggesting extensive thrombus from the right ICA terminus through the MCA bifurcation Some mass effect with partial effacement the sulci on the right. No midline shift. ASPECTS is 4/10   CTA H&N - Total occlusion of right ICA terminus. Thrombus extends through the right MCA bifurcation with poor collaterals.   CT Perfusion - Confirmed large right MCA territory infarct with core volume of 107 mL. Large mismatch volume of 9.6 with  a mismatch ratio of 1.9.   CT head -  Large area of hypoattenuation within the right MCA territory, consistent with subacute infarct. Multifocal hyperdensity within the right MCA territory is consistent with hemorrhage or contrast staining. The more peripheral hyperdensities are favored to be hemorrhagic. 6 mm leftward midline shift  CT Head 11/02/19 - Worsened edema within the right MCA territory with increased size of right basal ganglia intraparenchymal hematoma. Increased amount of subarachnoid blood over the right hemisphere. 11 mm of  leftward midline shift  MRI head - not performed - PPM   CT head 4/20 postop changed from R crani. Evolution large R MCA infarct w/ interval increase edema/swelling. Increased petechial hemorrhage throughout. SAH also increased. R BG HT slightly increased. Mass effect, partial effacement ventricles w/ 23mm L midline shift   CT 4/22 stable large R MCA infarct w/ diffuse petechial hemorrhage   CT Head 11/09/19 - Continued interval evolution of large right MCA territory infarct with associated hemorrhagic transformation, relatively stable as compared to 11/06/2019. No evidence for interval hemorrhage or re-bleeding. Associated mass effect with up to 12 mm of right-to-left shift not significantly changed. Stable asymmetric dilatation of the left lateral ventricle.  2D Echo - EF 50-55%. No source of embolus. 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   Sars Corona Virus 2 - negative  LDL - 158  HgbA1c - 9.7  UDS - negative  VTE prophylaxis - SCDs  warfarin daily prior to admission, now on No antithrombotic  Therapy recommendations:  CIR vs LTAC - Pre-Cert needed for insurance. D/w Case mgt today. If PEG cannot be placed d/t national shortage of this supply, then she can d/c to LTAC and still get PEG once supplies are received for this procedure.  Disposition:  Pending  Acute hypoxemia respiratory failure d/t stroke  Intubated for emergent hemicraniectomy for cerebral edema  Now extubated  CXR 4/20 - Complete opacification of the retrocardiac left lung base worrisome for left lower lobe atelectasis.  CXR 4/22 B pulm infiltrates / edema and B pleural effusions  CXR 4/23 - Bilateral pleural effusions, right greater than left, with associated opacity, likely atelectasis.  CXR 11/09/19 - Stable small right effusion with underlying atelectasis. Stableeft retrocardiac opacity. The small layering left effusion seen previously is less prominent.  CXR 4/27: pleural effusions. Pt  tachyonic, but no acute distress  Lasix 40 IV given x1  Cerebral edema w/ subfalcine herniation s/p decompressive hemicraniectomy  CT 4/22 stable large R MCA infarct w/ diffuse petechial hemorrhage  3% saline @ 50 cc/hr -> NS @ 50 -> off  Na 138->...-151-150-150->148->148->151  NSG on board s/p [facility] 4/18  PICC line placed  23.4% x 2  Now >7 days out, will allow Na to gradually trend down.  Atrial fibrillation w/ RVR  Telemetry showed A. fib, rate controlled  On Coumadin PTA, family stated compliance  Subtherapeutic INR 1.2 on presentation -> 1.4  No AC at the time due to large infarct and hemorrhagic conversion (previously refused DOACs)  metoprolol 50 bid    On cardizem 60 q 8h->60 q 6h  amiodarone gtt converted to po 4/22 - 400 bid x 1 week then 400 daily  EP Cardiology on board  Will need to discuss timing of restarting [place] down the line once PEG is done and she is more stable, recovering and rehabing  Hypertension->Hypotension  Home BP meds: Atenolol; Cardizem ; lisinopril  Current BP meds: cardizem, metoprolol 50 bid  Off cleviprex  lisinopril d/c due  to low BP  BP improved  Resume metoprolol 25 bid -> 50 bid . SBP goal < 160 mm Hg . Long-term BP goal normotensive  Hyperlipidemia  Home Lipid lowering medication: zetia  LDL 158, goal < 70  Hold off statin for know due to left hemorrhagic conversion  Consider statin at discharge  Diabetes, uncontrolled  Home diabetic meds: metformin  HgbA1c 9.7, goal < 7.0  CBG monitoring  Hyperglycemia - on lantus 25 bid, novolog 7u q4h     Continue SSI 0-20  DM coordinator consulted  Anemia and thrombocytopenia, improving  Platelet 157    Hb 10.3    Close monitoring  Dysphagia  NPO  Has NG  On TF @ 59  Speech following  Other Stroke Risk Factors  Advanced age  ETOH use, advised to drink no more than 1 alcoholic beverage per day.  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 Zosyn IV started  Add amandtadine to assist with wakeful state and rehab      Continue tube feeds for now.  Mobilize out of bed.  Continue ongoing therapies.  She continues to have low-grade fever and climbing WBCs. Increased HR and some dyspnoea noted. CXR with infiltrates again. Fear she may be getting aspiration pna. Will start Zosyn. UA and  blood cultures neg. Appreciate cardiology help.    Hospital day # [id number] Metz[place], ARNP-C, ANVP-BC Pager: [phone] I have personally obtained history,examined this patient, reviewed notes, independently viewed imaging studies, participated in medical decision making and plan of care.ROS completed by me personally and pertinent positives fully documented  I have made any additions or clarifications directly to the above note. Agree with note above.  Patient continues to make slow progress but has significant dysphagia and will likely need PEG tube placement.  Long discussion with patient and husband at the bedside and they are in agreement with plan.  Consult trauma team for PEG.  Greater than 50% time during this 25-minute visit was spent on counseling and coordination of care about her stroke, dysphagia and answering questions  Antony Contras, MD Medical Director [place] Pager: [phone] 11/13/2019 4:06 PM  11/13/2019 1:57 PM  To contact Stroke Continuity provider, please refer to [email or web address] After hours, contact General Neurology

## 2019-11-13 NOTE — Progress Notes (Signed)
Pt has been tachycardic and tachypnea with hr in 110s to 120  dnd respiration in 27-30s bpm.  This has been pt's baseline most of the dayst with high mews score. Per reporting RN Patientt was monitored per Mews guidelines.  Needed scheduled  and prn medication.complements with all necessary nursing care renderedd. Charge nurse made aware  . Ice pack and tylenol  for temperature control and pain medication  for pain given. Antibiotic therapy  per orders. HR and respiration rate came under control. and mews score  down to green and Yellow.  Pt.s Vs are stable.   RN will coatinue to monitor pt closely.Marland Kitchen

## 2019-11-14 ENCOUNTER — Inpatient Hospital Stay (HOSPITAL_COMMUNITY): Payer: Medicare Other

## 2019-11-14 DIAGNOSIS — I63411 Cerebral infarction due to embolism of right middle cerebral artery: Secondary | ICD-10-CM | POA: Diagnosis not present

## 2019-11-14 LAB — GLUCOSE, CAPILLARY
Glucose-Capillary: 118 mg/dL — ABNORMAL HIGH (ref 70–99)
Glucose-Capillary: 122 mg/dL — ABNORMAL HIGH (ref 70–99)
Glucose-Capillary: 125 mg/dL — ABNORMAL HIGH (ref 70–99)
Glucose-Capillary: 135 mg/dL — ABNORMAL HIGH (ref 70–99)
Glucose-Capillary: 141 mg/dL — ABNORMAL HIGH (ref 70–99)
Glucose-Capillary: 182 mg/dL — ABNORMAL HIGH (ref 70–99)
Glucose-Capillary: 99 mg/dL (ref 70–99)

## 2019-11-14 LAB — CBC
HCT: 31.9 % — ABNORMAL LOW (ref 36.0–46.0)
Hemoglobin: 9.9 g/dL — ABNORMAL LOW (ref 12.0–15.0)
MCH: 30.7 pg (ref 26.0–34.0)
MCHC: 31 g/dL (ref 30.0–36.0)
MCV: 98.8 fL (ref 80.0–100.0)
Platelets: 315 10*3/uL (ref 150–400)
RBC: 3.23 MIL/uL — ABNORMAL LOW (ref 3.87–5.11)
RDW: 15.3 % (ref 11.5–15.5)
WBC: 12.6 10*3/uL — ABNORMAL HIGH (ref 4.0–10.5)
nRBC: 0.7 % — ABNORMAL HIGH (ref 0.0–0.2)

## 2019-11-14 NOTE — Progress Notes (Addendum)
STROKE TEAM PROGRESS NOTE   INTERVAL HISTORY Pt remains drowsy but intermittently arouses.  Following commands this morning. Plans for PEG placement on Monday with trauma team.  Speech therapy plan to do modified barium later today.  Discussed with speech therapist  OBJECTIVE Vitals:   11/14/19 0408 11/14/19 0500 11/14/19 0855 11/14/19 0900  BP:  (!) 132/93 95/76   Pulse:  72 67   Resp:  (!) 21 (!) 22   Temp: 98.4 F (36.9 C)   98.5 F (36.9 C)  TempSrc: Oral   Axillary  SpO2:  100% 100%   Weight:      Height:        CBC:  Recent Labs  Lab 11/13/19 0500 11/14/19 0428  WBC 12.7* 12.6*  HGB 10.0* 9.9*  HCT 32.6* 31.9*  MCV 99.4 98.8  PLT 294 161   Basic Metabolic Panel:  Recent Labs  Lab 11/11/19 0454 11/12/19 0445  NA 149* 151*  K 3.9 3.6  CL 110 111  CO2 30 31  GLUCOSE 186* 135*  BUN 32* 28*  CREATININE 0.86 0.89  CALCIUM 8.2* 8.4*  MG 2.8* 2.5*  PHOS 3.5 4.0   IMAGING past 24h  No results found.   PHYSICAL EXAM      Middle-aged African-American lady not in distress. Afebrile. Head is traumatic with multiple incisions and dressings and a decompressed skull w/o bone flap. Dressings are melded to head d/t hematoma drainage that is now crusted. Unable to remove by RN as it is entangled with incision. Cardiac exam irregular heart sounds no murmur or gallop. Lungs are clear to auscultation.  Distal pulses are well felt.  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 Eyes still in right gaze position, not blinking to visual threat on left.  Mostly remains with eyes closed during exam. She has considerable psycho-motor slowing, but will follow simple commands on the right side. Her voice is extremely hypophonic and difficult to hear. Dysarthric. Tngue protrusion not cooperative. On pain stimulation,  LUE and LLE slight withdraw. DTR 1+ and bilateral upgoing toes. coordination and gait not tested.  ASSESSMENT/PLAN [name] is a 64 y.o. female with history of HTN, DM2, hx of mitral valve repair, chronic a. Fib (coumadin - INR 1.2 on admission) s/p pacemaker who presented to [facility] ED as a code stroke for right gaze deviation, urinary incontinence and left sided weakness. She did not receive IV t-PA due to anticoagulation with coumadin. IR -> ICA terminus occlusion. Mechanical thrombectomy performed with 2 stent retriever + aspiration passes in the MCA-ICA (solitaire and embotrap), 1 stent retriever + aspiration pass MCA (embotrap) and 1 direct contact aspiration in the right ACA. Complete recanalization achieved (TICI3).  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 Cannot exclude etiology as embolic secondary to atrial fibrillation on warfarin with subtherapeutic INR CT Head -  Large right MCA territory nonhemorrhagic infarct is described. Hyperdense right MCA suggesting extensive thrombus from the right ICA terminus through the MCA bifurcation Some mass effect with partial effacement the sulci on the right. No midline shift. ASPECTS is 4/10  CTA H&N - Total occlusion of right ICA terminus. Thrombus extends through the right MCA bifurcation with poor collaterals.  CT Perfusion - Confirmed large right MCA territory infarct with core volume of 107 mL. Large mismatch volume of 9.6 with a mismatch ratio of 1.9.  CT head -  Large area of hypoattenuation within the right MCA territory, consistent with subacute infarct. Multifocal hyperdensity within the right MCA territory is consistent with hemorrhage or contrast staining. The more peripheral hyperdensities are favored to be hemorrhagic. 6 mm leftward midline shift CT Head 11/02/19 - Worsened edema within the right MCA territory with increased size of right basal ganglia intraparenchymal hematoma. Increased amount of subarachnoid blood over the right hemisphere. 11 mm of leftward midline shift MRI head - not performed -  PPM  CT head 4/20 postop changed from R crani. Evolution large R MCA infarct w/ interval increase edema/swelling. Increased petechial hemorrhage throughout. SAH also increased. R BG HT slightly increased. Mass effect, partial effacement ventricles w/ 55mm L midline shift  CT 4/22 stable large R MCA infarct w/ diffuse petechial hemorrhage  CT Head 11/09/19 - Continued interval evolution of large right MCA territory infarct with associated hemorrhagic transformation, relatively stable as compared to 11/06/2019. No evidence for interval hemorrhage or re-bleeding. Associated mass effect with up to 12 mm of right-to-left shift not significantly changed. Stable asymmetric dilatation of the left lateral ventricle. 2D Echo - EF 50-55%. No source of embolus. 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  Sars Corona Virus 2 - negative LDL - 158 HgbA1c - 9.7 UDS - negative VTE prophylaxis - SCDs warfarin daily prior to admission, now on No antithrombotic Therapy recommendations:  CIR vs LTAC - Pre-Cert needed for insurance. D/w Case mgt today. If PEG cannot be placed d/t national shortage of this supply, then she can d/c to LTAC and still get PEG once supplies are received for this procedure. Disposition:  Pending  Acute hypoxemia respiratory failure d/t stroke Intubated for emergent hemicraniectomy for cerebral edema Now extubated CXR 4/20 - Complete opacification of the retrocardiac left lung base worrisome for left lower lobe atelectasis. CXR 4/22 B pulm infiltrates / edema and B pleural effusions CXR 4/23 - Bilateral pleural effusions, right greater than left, with associated opacity, likely atelectasis. CXR 11/09/19 - Stable small right effusion with underlying atelectasis. Stableeft retrocardiac opacity. The small layering left effusion seen previously is less prominent. CXR 4/27: pleural effusions. Pt tachyonic, but no acute distress Lasix 40 IV given x1  Cerebral edema w/ subfalcine herniation  s/p decompressive hemicraniectomy CT 4/22 stable large R MCA infarct w/ diffuse petechial hemorrhage 3% saline @ 50 cc/hr -> NS @ 50 -> off Na 138->...-151-150-150->148->148->151 NSG on board s/p [facility] 4/18 PICC line placed 23.4% x 2 Now >8 days out, will allow Na to gradually trend down.  Atrial fibrillation w/ RVR Telemetry showed A. fib, rate controlled On Coumadin PTA, family stated compliance Subtherapeutic INR 1.2 on presentation -> 1.4 No AC at the time due to large infarct and hemorrhagic conversion (previously refused DOACs) metoprolol 50 bid   On cardizem 60 q 8h->60 q 6h amiodarone gtt converted to po 4/22 - 400 bid x 1 week then 400 daily EP Cardiology on board Will need to discuss timing of restarting [place] down the line once PEG is done and she is more stable, recovering and rehabing  Hypertension->Hypotension Home BP meds: Atenolol; Cardizem ; lisinopril Current BP meds: cardizem, metoprolol 50 bid Off cleviprex lisinopril d/c due to low BP BP improved Resume metoprolol 25 bid -> 50 bid SBP goal < 160 mm Hg Long-term BP goal normotensive  Hyperlipidemia Home Lipid lowering medication: zetia LDL 158, goal < 70 Hold off statin for know due to left hemorrhagic conversion Consider statin at discharge  Diabetes, uncontrolled Home diabetic  meds: metformin HgbA1c 9.7, goal < 7.0 CBG monitoring Hyperglycemia - on lantus 25 bid, novolog 7u q4h    Continue SSI 0-20 DM coordinator consulted  Anemia and thrombocytopenia, improving Platelet 157   Hb 10.3   Close monitoring  Dysphagia NPO Has cortrak, PEG placement on Monday 5/3 On TF @ 66 Speech following  Other Stroke Risk Factors Advanced age ETOH use, advised to drink no more than 1 alcoholic beverage per day.  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 Zosyn IV started Add amandtadine to assist with wakeful state and rehab     Continue tube feeds for now.  Mobilize out of bed.  Continue ongoing therapies.  Continue Zosyn for concern for PNA. UA and  blood cultures neg. Appreciate cardiology help.  Speech therapy to do swallow eval and if she is improving over the weekend with her swallowing may defer PEG tube.  Discussed with trauma team and speech therapist.   Hospital day # [id number] PA-C [facility] [phone] I have personally obtained history,examined this patient, reviewed notes, independently viewed imaging studies, participated in medical decision making and plan of care.ROS completed by me personally and pertinent positives fully documented  I have made any additions or clarifications directly to the above note. Agree with note above.  Greater than 50% time during this 25-minute visit was spent on counseling and coordination of care and discussion with care team both of stroke and dysphagia and need for PEG tube  Antony Contras, MD Medical Director [place] Pager: [phone] 11/14/2019 3:23 PM  To contact Stroke Continuity provider, please refer to [email or web address] After hours, contact General Neurology

## 2019-11-14 NOTE — Progress Notes (Signed)
Occupational Therapy Treatment Patient Details Name: Krystal Little MRN: [id number] DOB: 1956-04-19 Today's Date: 11/14/2019    History of present illness Pt is 64 y.o. female presented to [facility] ED as a code stroke for  Right gaze deviation and left side weakness. 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 Pt s/p R hemicraniectomy with bone flap placed in abdomen on 4/18.  PMH HTN, DM2, chronic a. Fib (coumadin) s/p pace maker.   OT comments  Patient continues to make steady progress towards goals in skilled OT session. Patient's session encompassed co-treat with PT in order to address functional deficits and complete NDT techniques and strengthening. Pt is able to follow one step commands with increased time, but requires mod/max A in order to complete ADLs due to neglect. Pt able to demonstrate activation at the trunk to weight shift while sitting EOB with max A, however fatigues quickly when completing NDT techniques. Noted to be guarding neck in session to date, with steady gaze to left. Pt attempted to complete AAROM with therapist, but minimal gains made in ranging due to pain and guarding. Pt would continue to benefit from skilled services in order to address functional deficits; will continue to follow acutely.    Follow Up Recommendations  CIR;Supervision/Assistance - 24 hour    Equipment Recommendations  Wheelchair (measurements OT);Wheelchair cushion (measurements OT)    Recommendations for Other Services      Precautions / Restrictions Precautions Precautions: Fall Precaution Comments: R hemicraniectomy- skull in R abdomen Restrictions Weight Bearing Restrictions: No       Mobility Bed Mobility Overal bed mobility: Needs Assistance Bed Mobility: Supine to Sit;Sit to Supine Rolling: Max assist Sidelying to sit: Max assist;+2 for safety/equipment;+2 for physical assistance Supine to sit: Max assist;+2 for physical assistance;+2 for  safety/equipment Sit to supine: Max assist;+2 for physical assistance;+2 for safety/equipment   General bed mobility comments: with rolling used RUE to reach and assist with trunk  Transfers                 General transfer comment: unable to generate enough movement to attempt    Balance Overall balance assessment: Needs assistance Sitting-balance support: Bilateral upper extremity supported;Feet supported Sitting balance-Leahy Scale: Poor Sitting balance - Comments: cues and assist for neck movement in any direction and trunk control in any direction Postural control: Posterior lean Standing balance support: Single extremity supported Standing balance-Leahy Scale: Zero Standing balance comment: dependent on physical assist                           ADL either performed or assessed with clinical judgement   ADL Overall ADL's : Needs assistance/impaired     Grooming: Wash/dry face;Maximal assistance Grooming Details (indicate cue type and reason): told to wash face and washing only R eye, with tactile cues able to wash forehead minimally on L side                     Toileting- Clothing Manipulation and Hygiene: Total assistance;+2 for physical assistance;+2 for safety/equipment;Bed level Toileting - Clothing Manipulation Details (indicate cue type and reason): rolling and peri care       General ADL Comments: Pt requires max cues to keep eyes open but more more receptive to commands to attempt to weight shift at trunk in each plane, pt able to sit EOB for < 15 minutes, pt with increased tightness in neck, ranging  attempted, but pt would gaurd with movement     Vision       Perception     Praxis      Cognition Arousal/Alertness: Lethargic Behavior During Therapy: Flat affect Overall Cognitive Status: Difficult to assess Area of Impairment: Awareness;Problem solving;Attention                   Current Attention Level: Selective    Following Commands: Follows one step commands inconsistently;Follows one step commands with increased time   Awareness: Intellectual Problem Solving: Slow processing;Requires verbal cues;Requires tactile cues;Decreased initiation General Comments: non verbal, following one step commands, slow to respond and trying to follow directions from therapists        Exercises Exercises: General Lower Extremity General Exercises - Lower Extremity Long Arc Quad: AROM;PROM;5 reps Hip Flexion/Marching: AROM;PROM;10 reps Other Exercises Other Exercises: cervical SB, extension Other Exercises: trunk flexion SB and ext   Shoulder Instructions       General Comments      Pertinent Vitals/ Pain       Pain Assessment: Faces Faces Pain Scale: Hurts little more Pain Location: nods yes and points to R craniectomy scarm noted gaurding when ranging neck Pain Descriptors / Indicators: Discomfort Pain Intervention(s): Limited activity within patient's tolerance;Monitored during session;Repositioned  Home Living Family/patient expects to be discharged to:: Skilled nursing facility Living Arrangements: Spouse/significant other;Children;Other relatives Available Help at Discharge: Family;Available 24 hours/day Type of Home: House Home Access: Stairs to enter [employer] of Steps: 2   Home Layout: One level     Bathroom Shower/Tub: Teacher, early years/pre: Standard     Home Equipment: None   Additional Comments: information fr      Prior Functioning/Environment              Frequency  Min 2X/week        Progress Toward Goals  OT Goals(current goals can now be found in the care plan section)  Progress towards OT goals: Progressing toward goals  Acute Rehab OT Goals Patient Stated Goal: none stated OT Goal Formulation: Patient unable to participate in goal setting Time For Goal Achievement: 11/17/19 Potential to Achieve Goals: Good  Plan Discharge plan  remains appropriate    Co-evaluation    PT/OT/SLP Co-Evaluation/Treatment: Yes Reason for Co-Treatment: Complexity of the patient's impairments (multi-system involvement);Necessary to address cognition/behavior during functional activity;For patient/therapist safety PT goals addressed during session: Mobility/safety with mobility;Balance OT goals addressed during session: ADL's and self-care      AM-PAC OT "6 Clicks" Daily Activity     Outcome Measure   Help from another person eating meals?: Total Help from another person taking care of personal grooming?: A Lot Help from another person toileting, which includes using toliet, bedpan, or urinal?: Total Help from another person bathing (including washing, rinsing, drying)?: Total Help from another person to put on and taking off regular upper body clothing?: Total Help from another person to put on and taking off regular lower body clothing?: Total 6 Click Score: 7    End of Session    OT Visit Diagnosis: Unsteadiness on feet (R26.81);Other abnormalities of gait and mobility (R26.89);Muscle weakness (generalized) (M62.81)   Activity Tolerance Patient limited by fatigue   Patient Left in bed;with call bell/phone within reach;with family/visitor present;with bed alarm set;with nursing/sitter in room   Nurse Communication Mobility status;Precautions        Time: 1761-6073 OT Time Calculation (min): 36 min  Charges: OT General Charges $OT Visit:  1 Visit OT Treatments $Self Care/Home Management : 8-22 mins  [place]. Reni Hausner, COTA/L Acute Rehabilitation Services [place] 11/14/2019, 2:49 PM

## 2019-11-14 NOTE — Evaluation (Signed)
Physical Therapy Evaluation Patient Details Name: Krystal Little MRN: [id number] DOB: [birth year] Today's Date: 11/14/2019   History of Present Illness  Pt is 64 y.o. female presented to [facility] ED as a code stroke for  Right gaze deviation and left side weakness. 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 Pt s/p R hemicraniectomy with bone flap placed in abdomen on 4/18.  PMH HTN, DM2, chronic a. Fib (coumadin) s/p pace maker.  Clinical Impression  Pt was seen with OT to work on her progression to side of bed with 2 assist, to work on sitting balance recovery and UE/LE exercises.  Her tolerance is limited but did get activation of trunk control to both extend neck and thorax.  Pt is with husband when PT arrived with OT, but did step out for the end of the session.  Pt returned to bed due to significant fatigue.  Follow acutely as planned, asking for CIR to make as much progress as possible with strength and balance.    Follow Up Recommendations CIR;Supervision/Assistance - 24 hour    Equipment Recommendations  Hospital bed;Wheelchair (measurements PT);Wheelchair cushion (measurements PT);Other (comment)(hoyer)    Recommendations for Other Services Rehab consult     Precautions / Restrictions Precautions Precautions: Fall Precaution Comments: R hemicraniectomy- skull in R abdomen Restrictions Weight Bearing Restrictions: No      Mobility  Bed Mobility Overal bed mobility: Needs Assistance Bed Mobility: Supine to Sit;Sit to Supine Rolling: Max assist Sidelying to sit: Max assist;+2 for safety/equipment;+2 for physical assistance Supine to sit: Max assist;+2 for physical assistance;+2 for safety/equipment Sit to supine: Max assist;+2 for physical assistance;+2 for safety/equipment   General bed mobility comments: with rolling used RUE to reach and assist with trunk  Transfers                 General transfer comment: unable to generate enough movement  to attempt  Ambulation/Gait                Stairs            Wheelchair Mobility    Modified Rankin (Stroke Patients Only)       Balance Overall balance assessment: Needs assistance Sitting-balance support: Bilateral upper extremity supported;Feet supported Sitting balance-Leahy Scale: Poor Sitting balance - Comments: cues and assist for neck movement in any direction and trunk control in any direction                                     Pertinent Vitals/Pain Pain Assessment: Faces Faces Pain Scale: Hurts little more Pain Location: nods yes and points to R craniectomy scar    Home Living Family/patient expects to be discharged to:: Skilled nursing facility Living Arrangements: Spouse/significant other;Children;Other relatives Available Help at Discharge: Family;Available 24 hours/day Type of Home: House Home Access: Stairs to enter   [employer] of Steps: 2 Home Layout: One level Home Equipment: None Additional Comments: information fr    Prior Function                 Hand Dominance        Extremity/Trunk Assessment                Communication      Cognition Arousal/Alertness: Lethargic Behavior During Therapy: Flat affect Overall Cognitive Status: Difficult to assess Area of Impairment: Awareness;Problem solving;Attention  Current Attention Level: Selective   Following Commands: Follows one step commands inconsistently;Follows one step commands with increased time   Awareness: Intellectual Problem Solving: Slow processing;Requires verbal cues;Requires tactile cues;Decreased initiation General Comments: non verbal, following one step commands, slow to respond and trying to follow directions from therapists      General Comments      Exercises General Exercises - Lower Extremity Long Arc Quad: AROM;PROM;5 reps Hip Flexion/Marching: AROM;PROM;10 reps Other Exercises Other  Exercises: cervical SB, extension Other Exercises: trunk flexion SB and ext   Assessment/Plan    PT Assessment    PT Problem List         PT Treatment Interventions      PT Goals (Current goals can be found in the Care Plan section)  Acute Rehab PT Goals Patient Stated Goal: none stated PT Goal Formulation: With family    Frequency Min 3X/week   Barriers to discharge        Co-evaluation PT/OT/SLP Co-Evaluation/Treatment: Yes Reason for Co-Treatment: Complexity of the patient's impairments (multi-system involvement);For patient/therapist safety;To address functional/ADL transfers PT goals addressed during session: Mobility/safety with mobility;Balance         AM-PAC PT "6 Clicks" Mobility  Outcome Measure Help needed turning from your back to your side while in a flat bed without using bedrails?: Total Help needed moving from lying on your back to sitting on the side of a flat bed without using bedrails?: Total Help needed moving to and from a bed to a chair (including a wheelchair)?: Total Help needed standing up from a chair using your arms (e.g., wheelchair or bedside chair)?: Total Help needed to walk in hospital room?: Total Help needed climbing 3-5 steps with a railing? : Total 6 Click Score: 6    End of Session Equipment Utilized During Treatment: Oxygen Activity Tolerance: Patient limited by lethargy;Treatment limited secondary to medical complications (Comment) Patient left: in bed;with call bell/phone within reach;with nursing/sitter in room(leaving for swallow study) Nurse Communication: Mobility status;Other (comment)(had loose bowel movement) PT Visit Diagnosis: Difficulty in walking, not elsewhere classified (R26.2)    Time: 4920-1007 PT Time Calculation (min) (ACUTE ONLY): 36 min   Charges:     PT Treatments $Therapeutic Activity: 8-22 mins       Ramond Dial 11/14/2019, 2:42 PM  Mee Hives, PT MS Acute Rehab Dept. Number: [place] and  [place]

## 2019-11-14 NOTE — Procedures (Signed)
Cortrak  Tube Type:  Cortrak - 43 inches Tube Location:  Left nare Initial Placement:  Stomach Secured by: Bridle Technique Used to Measure Tube Placement:  Documented cm marking at nare/ corner of mouth Cortrak Secured At:  65 cm    Cortrak Tube Team Note:  Consult received to place a Cortrak feeding tube.   No x-ray is required. RN may begin using tube.    If the tube becomes dislodged please keep the tube and contact the Cortrak team at [email or web address] (password [email or web address]) for replacement.  If after hours and replacement cannot be delayed, place a NG tube and confirm placement with an abdominal x-ray.    Krystal Arreola MS, RD, LDN Please refer to AMION for RD and/or RD on-call/weekend/after hours pager   

## 2019-11-14 NOTE — Progress Notes (Signed)
Nutrition Follow-up  **RD working remotely**  DOCUMENTATION CODES:   Not applicable  INTERVENTION:  Continue Jevity 1.2 @ 60 ml/hr via Cortrak   140m free water flush QID via Cortrak  Provides: 1728 kcal, 80 grams protein, and 1167 ml free water. (17251mfree water)   NUTRITION DIAGNOSIS:   Inadequate oral intake related to inability to eat as evidenced by NPO status.  Progressing, pt now on Dysphagia 1 diet  GOAL:   Patient will meet greater than or equal to 90% of their needs  Met with TF.   MONITOR:   TF tolerance, Diet advancement, Labs  REASON FOR ASSESSMENT:   Consult, Ventilator Enteral/tube feeding initiation and management  ASSESSMENT:   Pt with PMH of DM, chronic AF with pacemaker found down with R MCA stroke s/p IR thrombectomy.  4/18 s/p emergent decompressive hemicraniectomy due to vasogenic cerebral edema  4/25 extubated  4/30 Cortrak (tip in stomach); MBS, recommending D1/Nectars  Unable to reach pt via phone.   Per MD, plan for PEG placement next week. Pt noted to be tolerating tube feeding well.   Of note, pt now on a Dysphagia 1 diet with Nectar thick liquids after MBS today; however, recommend continuing tube feeding until adequacy of po intake can be observed. RD will adjust tube feeding based on pt's po intake.   Current tube feeding orders: Jevity 1.2 cal @ 603mr  Labs: Na 151 (H), Mg 2.5 (H), CBGs 125-118-122 Medications reviewed and include: Novolog, Miralax, Senokot-S   Diet Order:   Diet Order            DIET - DYS 1 Room service appropriate? Yes; Fluid consistency: Nectar Thick  Diet effective now              EDUCATION NEEDS:   No education needs have been identified at this time  Skin:  Skin Assessment: Skin Integrity Issues: Skin Integrity Issues:: Incisions, Other (Comment) Incisions: pretibial venous stasis ulcer Other: incision groin, head, abdomen  Last BM:  4/30 type 7  Height:   Ht Readings from  Last 1 Encounters:  10/31/19 '5\' 4"'  (1.626 m)    Weight:   Wt Readings from Last 1 Encounters:  11/11/19 60.5 kg    BMI:  Body mass index is 22.89 kg/m.  Estimated Nutritional Needs:   Kcal:  1500-1700  Protein:  76-85 grams  Fluid:  >1.6 L/day   AmaLarkin InaS, RD, LDN RD pager number and weekend/on-call pager number located in [facility]

## 2019-11-14 NOTE — Progress Notes (Addendum)
[facility] Progress Note  12 Days Post-Op  Subjective: CC:  NAEO. Patient not FC for me this morning.  Objective: Vital signs in last 24 hours: Temp:  [97.1 F (36.2 C)-98.6 F (37 C)] 98.4 F (36.9 C) (04/30 0408) Pulse Rate:  [60-100] 72 (04/30 0500) Resp:  [13-24] 21 (04/30 0500) BP: (99-140)/(55-93) 132/93 (04/30 0500) SpO2:  [97 %-100 %] 100 % (04/30 0500) Last BM Date: 11/13/19  Intake/Output from previous day: 04/29 0701 - 04/30 0700 In: 1001.5 [I.V.:120; NG/GT:831.5; IV Piggyback:50] Out: -  Intake/Output this shift: No intake/output data recorded.  PE: Gen:  Resting comfortably, eyes closed  HEENT: TF running at goal rate  Card:  Regular rate and rhythm, pedal pulses 2+ BL Pulm:  Normal effort, clear to auscultation bilaterally Abd: Soft, nondistended, appropriately tender RUQ around incision with palpable underlying bone, RUQ incision with staples c/d/i Skin: warm and dry, no rashes  Psych: A&Ox3   Lab Results:  Recent Labs    11/13/19 0500 11/14/19 0428  WBC 12.7* 12.6*  HGB 10.0* 9.9*  HCT 32.6* 31.9*  PLT 294 315   BMET Recent Labs    11/12/19 0445  NA 151*  K 3.6  CL 111  CO2 31  GLUCOSE 135*  BUN 28*  CREATININE 0.89  CALCIUM 8.4*   PT/INR No results for input(s): LABPROT, INR in the last 72 hours. CMP     Component Value Date/Time   NA 151 (H) 11/12/2019 0445   NA 140 08/18/2019 1120   K 3.6 11/12/2019 0445   CL 111 11/12/2019 0445   CO2 31 11/12/2019 0445   GLUCOSE 135 (H) 11/12/2019 0445   BUN 28 (H) 11/12/2019 0445   BUN 12 08/18/2019 1120   CREATININE 0.89 11/12/2019 0445   CREATININE 0.73 05/18/2016 0919   CALCIUM 8.4 (L) 11/12/2019 0445   PROT 7.4 10/31/2019 1744   PROT 7.1 08/18/2019 1120   ALBUMIN 4.1 10/31/2019 1744   ALBUMIN 4.4 08/18/2019 1120   AST 25 10/31/2019 1744   ALT 20 10/31/2019 1744   ALKPHOS 92 10/31/2019 1744   BILITOT 1.1 10/31/2019 1744   BILITOT 0.6 08/18/2019 1120   GFRNONAA  >60 11/12/2019 0445   GFRNONAA 89 05/18/2016 0919   GFRAA >60 11/12/2019 0445   GFRAA >89 05/18/2016 0919   Lipase  No results found for: LIPASE     Studies/Results: No results found.  Anti-infectives: Anti-infectives (From admission, onward)   Start     Dose/Rate Route Frequency Ordered Stop   11/12/19 1515  piperacillin-tazobactam (ZOSYN) IVPB 3.375 g     3.375 g 12.5 mL/hr over 240 Minutes Intravenous Every 8 hours 11/12/19 1500     11/10/19 1656  ceFEPIme (MAXIPIME) 2 g in sodium chloride 0.9 % 100 mL IVPB  Status:  Discontinued     2 g 200 mL/hr over 30 Minutes Intravenous Every 12 hours 11/10/19 0711 11/10/19 1341   11/08/19 1100  ceFEPIme (MAXIPIME) 2 g in sodium chloride 0.9 % 100 mL IVPB  Status:  Discontinued     2 g 200 mL/hr over 30 Minutes Intravenous Every 8 hours 11/08/19 1032 11/10/19 0711   11/02/19 0854  bacitracin 50,000 Units in sodium chloride 0.9 % 500 mL irrigation  Status:  Discontinued       As needed 11/02/19 0854 11/02/19 1006       Assessment/Plan HTN DM Hx mitral valve repair Chronic A fib on coumadin - hold upon admission S/p pacemaker  Large right MCA  infarct Dysphagia - s/p R hemicraniectomy with bone flap placed in abdomen on 4/18 - Spoke with husband at bedside. Can plan for PEG placement Monday 11/17/2019 at 10 AM by Dr. Grandville Silos.  - Hold tube feeds 5/3 at 0001 for procedure   - procedure consent ordered   ID - currently zosyn 4/28>> for PNA VTE - SCDs, lovenox FEN - NPO, TF Foley - none Follow up - TBD  LOS: 14 days    Krystal Little, [facility]

## 2019-11-14 NOTE — Progress Notes (Signed)
  Speech Language Pathology Treatment:    Patient Details Name: Krystal Little MRN: [id number] DOB: January 12, 1956 Today's Date: 11/14/2019 Time: 1410-1430 SLP Time Calculation (min) (ACUTE ONLY): 20 min  Assessment / Plan / Recommendation Clinical Impression  Followed up at bedside to reassess pt with family present. Reviewed results of MBS with husband and RN, showed husband the importance of upright position. Pt able to sustain eye opening and grasp cup to self feed straw sips of nectar thick water. Several seconds between intake and swallow initiation, but no coughing or signs of aspiration observed. Hopeful for interest in PO and assistance from family and staff over the weekend to progress pt with PO intake.   HPI HPI: 64 y.o. female presented to [facility] ED as a code stroke for R gaze deviation and L-sided weakness. CT revealed a large R MCA infarct. Pt s/p R hemicraniectomy with bone flap placed in abdomen on 4/18. ETT 4/18-4/25. PMH HTN, DM2, chronic a. Fib (coumadin) s/p pace maker.      SLP Plan  Continue with current plan of care       Recommendations  Diet recommendations: Dysphagia 1 (puree);Nectar-thick liquid Liquids provided via: Straw Medication Administration: Crushed with puree Supervision: Patient able to self feed;Full supervision/cueing for compensatory strategies Compensations: Slow rate;Small sips/bites                Follow up Recommendations: Inpatient Rehab SLP Visit Diagnosis: Dysphagia, oropharyngeal phase (R13.12) Plan: Continue with current plan of care       GO               Herbie Baltimore, MA [place] Pager [phone] Office [phone]  Lynann Beaver 11/14/2019, 2:35 PM

## 2019-11-14 NOTE — Progress Notes (Addendum)
Modified Barium Swallow Progress Note  Patient Details  Name: Krystal Little MRN: [id number] Date of Birth: [birth year]  Today's Date: 11/14/2019  Modified Barium Swallow completed.  Full report located under Chart Review in the Imaging Section.  Brief recommendations include the following:  Clinical Impression  Pt demonstrates excellent arousal and participation in MBS today. Pt able to sustain eye opening once up in MBS chair, grasp cup lift head and initaite straw sips or self feed puree with a spoon. She had more significant struggle paricipating when being fed with total assist with a spoon. Primary impairment is oral phase dysphagia with decreased automaticity of lingual paricipation in bolus formation and posterior propulsion. Pt uses a slow thrusting movement for gradual spillage to the pharynx. Upright, neutral head posture is important to aid oral transit. There is pooling in the pharynx with all boluses as pt completes oral transit and triggers swallow. This process is slowest with spoons of puree and nectar and improved with self fed straw sips of nectar. Sensed aspiration only occurred with thin liquids via straw, though cough was insufficient to eject. Pt also had some mild oral residuals with all boluses that coated base of tongue and pharyngeal sinuses post swallow. Working on a dry swallow would be a good therapy target. Will recommend puree diet with nectar thick liquids via straw and f/u closely for tolerance.    Swallow Evaluation Recommendations       SLP Diet Recommendations: Dysphagia 1 (Puree) solids   Liquid Administration via: Spoon       Supervision: Staff to assist with self feeding;Full supervision/cueing for compensatory strategies   Compensations: Slow rate;Small sips/bites           Other Recommendations: Have oral suction available  Krystal Baltimore, MA [place] Pager [phone] Office [phone]    Krystal Little,  Katherene Ponto 11/14/2019,1:51 PM

## 2019-11-15 ENCOUNTER — Encounter (HOSPITAL_COMMUNITY): Payer: Self-pay | Admitting: Certified Registered"

## 2019-11-15 ENCOUNTER — Inpatient Hospital Stay (HOSPITAL_COMMUNITY): Payer: Medicare Other

## 2019-11-15 DIAGNOSIS — E1165 Type 2 diabetes mellitus with hyperglycemia: Secondary | ICD-10-CM | POA: Diagnosis not present

## 2019-11-15 DIAGNOSIS — I63411 Cerebral infarction due to embolism of right middle cerebral artery: Secondary | ICD-10-CM | POA: Diagnosis not present

## 2019-11-15 DIAGNOSIS — E876 Hypokalemia: Secondary | ICD-10-CM | POA: Diagnosis not present

## 2019-11-15 DIAGNOSIS — I482 Chronic atrial fibrillation, unspecified: Secondary | ICD-10-CM | POA: Diagnosis not present

## 2019-11-15 DIAGNOSIS — Z9289 Personal history of other medical treatment: Secondary | ICD-10-CM

## 2019-11-15 DIAGNOSIS — D72829 Elevated white blood cell count, unspecified: Secondary | ICD-10-CM

## 2019-11-15 LAB — CBC
HCT: 31.3 % — ABNORMAL LOW (ref 36.0–46.0)
Hemoglobin: 9.8 g/dL — ABNORMAL LOW (ref 12.0–15.0)
MCH: 31.2 pg (ref 26.0–34.0)
MCHC: 31.3 g/dL (ref 30.0–36.0)
MCV: 99.7 fL (ref 80.0–100.0)
Platelets: 346 10*3/uL (ref 150–400)
RBC: 3.14 MIL/uL — ABNORMAL LOW (ref 3.87–5.11)
RDW: 15.7 % — ABNORMAL HIGH (ref 11.5–15.5)
WBC: 11.9 10*3/uL — ABNORMAL HIGH (ref 4.0–10.5)
nRBC: 0.4 % — ABNORMAL HIGH (ref 0.0–0.2)

## 2019-11-15 LAB — BASIC METABOLIC PANEL
Anion gap: 11 (ref 5–15)
BUN: 19 mg/dL (ref 8–23)
CO2: 26 mmol/L (ref 22–32)
Calcium: 8.1 mg/dL — ABNORMAL LOW (ref 8.9–10.3)
Chloride: 107 mmol/L (ref 98–111)
Creatinine, Ser: 0.63 mg/dL (ref 0.44–1.00)
GFR calc Af Amer: >60 mL/min (ref >60)
GFR calc non Af Amer: >60 mL/min (ref >60)
Glucose, Bld: 198 mg/dL — ABNORMAL HIGH (ref 70–99)
Potassium: 3.5 mmol/L (ref 3.5–5.1)
Sodium: 144 mmol/L (ref 135–145)

## 2019-11-15 LAB — GLUCOSE, CAPILLARY
Glucose-Capillary: 182 mg/dL — ABNORMAL HIGH (ref 70–99)
Glucose-Capillary: 132 mg/dL — ABNORMAL HIGH (ref 70–99)
Glucose-Capillary: 123 mg/dL — ABNORMAL HIGH (ref 70–99)
Glucose-Capillary: 121 mg/dL — ABNORMAL HIGH (ref 70–99)
Glucose-Capillary: 90 mg/dL (ref 70–99)

## 2019-11-15 MED ORDER — INSULIN ASPART 100 UNIT/ML ~~LOC~~ SOLN
0.0000 [IU] | Freq: Three times a day (TID) | SUBCUTANEOUS | Status: DC
  Administered 2019-11-15: 3 [IU] via SUBCUTANEOUS
  Administered 2019-11-16: 4 [IU] via SUBCUTANEOUS
  Administered 2019-11-16: 3 [IU] via SUBCUTANEOUS
  Administered 2019-11-17: 7 [IU] via SUBCUTANEOUS
  Administered 2019-11-17: 4 [IU] via SUBCUTANEOUS
  Administered 2019-11-18: 16:00:00 3 [IU] via SUBCUTANEOUS
  Administered 2019-11-18: 12:00:00 7 [IU] via SUBCUTANEOUS
  Administered 2019-11-18: 4 [IU] via SUBCUTANEOUS
  Administered 2019-11-19: 3 [IU] via SUBCUTANEOUS
  Administered 2019-11-19: 2 [IU] via SUBCUTANEOUS
  Administered 2019-11-19 – 2019-11-20 (×2): 7 [IU] via SUBCUTANEOUS

## 2019-11-15 MED ORDER — SODIUM CHLORIDE 0.9 % IV SOLN: INTRAVENOUS | Status: DC

## 2019-11-15 MED ORDER — FREE WATER
150.0000 mL | Status: DC
  Administered 2019-11-15 – 2019-11-17 (×14): 150 mL

## 2019-11-15 MED ORDER — INSULIN ASPART 100 UNIT/ML ~~LOC~~ SOLN
9.0000 [IU] | Freq: Three times a day (TID) | SUBCUTANEOUS | Status: DC
  Administered 2019-11-16 – 2019-11-17 (×4): 9 [IU] via SUBCUTANEOUS

## 2019-11-15 MED ORDER — SODIUM CHLORIDE 3 % IN NEBU
4.0000 mL | INHALATION_SOLUTION | Freq: Three times a day (TID) | RESPIRATORY_TRACT | Status: DC
  Administered 2019-11-15 – 2019-11-17 (×7): 4 mL via RESPIRATORY_TRACT
  Filled 2019-11-15 (×8): qty 4

## 2019-11-15 MED ORDER — INSULIN GLARGINE 100 UNIT/ML ~~LOC~~ SOLN
20.0000 [IU] | Freq: Two times a day (BID) | SUBCUTANEOUS | Status: DC
  Administered 2019-11-15 – 2019-11-17 (×4): 20 [IU] via SUBCUTANEOUS
  Filled 2019-11-15 (×5): qty 0.2

## 2019-11-15 MED ORDER — METOPROLOL TARTRATE 25 MG PO TABS
25.0000 mg | ORAL_TABLET | Freq: Two times a day (BID) | ORAL | Status: DC
  Administered 2019-11-15 – 2019-11-20 (×11): 25 mg
  Filled 2019-11-15 (×12): qty 1

## 2019-11-15 MED ORDER — GLUCERNA SHAKE PO LIQD
237.0000 mL | Freq: Three times a day (TID) | ORAL | Status: DC
  Administered 2019-11-16 – 2019-11-17 (×4): 237 mL via ORAL

## 2019-11-15 MED ORDER — GLUCERNA 1.5 CAL PO LIQD
700.0000 mL | ORAL | Status: DC
  Administered 2019-11-15 – 2019-11-16 (×2): 700 mL
  Filled 2019-11-15: qty 711

## 2019-11-15 NOTE — Progress Notes (Addendum)
STROKE TEAM PROGRESS NOTE   INTERVAL HISTORY RN at bedside. Pt is lethargic but eyes spontaneously open, following commands on the right and able to name and repeat. Passed swallow yesterday but as per RN, pt did not eat much. Will change her tube feeding to nocturnal feeding to improve her appetite. Will consult dietitian for caloric count. Glucose better controlled, will decreased insulin dose.  OBJECTIVE Vitals:   11/15/19 0213 11/15/19 0314 11/15/19 0400 11/15/19 0413  BP: (!) 97/56 127/66  140/79  Pulse: 60 69 61 60  Resp: (!) 21 (!) 21 16 20   Temp: 98 F (36.7 C) 98.9 F (37.2 C)  98.5 F (36.9 C)  TempSrc: Axillary Oral    SpO2: 100% 100% 100% 100%  Weight:      Height:        CBC:  Recent Labs  Lab 11/13/19 0500 11/14/19 0428  WBC 12.7* 12.6*  HGB 10.0* 9.9*  HCT 32.6* 31.9*  MCV 99.4 98.8  PLT 294 970   Basic Metabolic Panel:  Recent Labs  Lab 11/11/19 0454 11/12/19 0445  NA 149* 151*  K 3.9 3.6  CL 110 111  CO2 30 31  GLUCOSE 186* 135*  BUN 32* 28*  CREATININE 0.86 0.89  CALCIUM 8.2* 8.4*  MG 2.8* 2.5*  PHOS 3.5 4.0   IMAGING past 24h  DG Swallowing Func-Speech Pathology  Result Date: 11/14/2019 Objective Swallowing Evaluation: Type of Study: MBS-Modified Barium Swallow Study  Patient Details Name: Krystal Little MRN: [id number] Date of Birth: May 01, 1956 Today's Date: 11/14/2019 Time: SLP Start Time (ACUTE ONLY): 1230 -SLP Stop Time (ACUTE ONLY): 1300 SLP Time Calculation (min) (ACUTE ONLY): 30 min Past Medical History: Past Medical History: Diagnosis Date . Chronic atrial fibrillation ([place]) 1990s  s/p DCCV then attempted ablation of complex A Flutter (@ [facility] - Dr. Deno Etienne), failed antiarrhythmics --> INR folllwed @ [facility] ->> now status post pacemaker placement with underlying A. fib. . Diabetes mellitus type 2, uncontrolled, without complications   On Oral Medications ([facility]) . Essential hypertension  . History of cardiac catheterization 2001  R&LHC -  normal Coronaries, no evidence of Restictive Cardiomyopathy or Constrictive Pericarditis (also @ [facility]) . Hx of sick sinus syndrome 07/1999  Wtih symptomatic bradycardia - syncope ([place]) . 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 CARDIAC CATHETERIZATION  08/03/1999  [employer] - normal Coronaries; no sign of constriction or restrictive cardiomypathy . CRANIECTOMY FOR DEPRESSED SKULL FRACTURE Right 11/02/2019  Procedure: DECOMPRESSIVE HEMI -CRANIECTOMY, IMPLANTATION OF SKULL FLAP TO RIGHT ABDOMEN;  Surgeon: Consuella Lose, MD;  Location: [place];  Service: Neurosurgery;  Laterality: Right; . IR CT HEAD LTD  10/31/2019 . IR PERCUTANEOUS ART THROMBECTOMY/INFUSION INTRACRANIAL INC DIAG ANGIO  10/31/2019    . IR PERCUTANEOUS ART THROMBECTOMY/INFUSION INTRACRANIAL INC DIAG ANGIO  10/31/2019 . MITRAL VALVE REPAIR  07/1999  Both Ant& Post leaflet repair - quadrangular resection&caudal transposition, #32 Sequin Annuloplasty ring . PACEMAKER GENERATOR CHANGE  11/26/2008  Medtronic Adapta L(? if it has been checked since 02/2013) . PACEMAKER INSERTION  07/1999  for SSS in setting of  Chronic Afib; Medtronic Kappa [id number], [id number] H. Marland Kitchen RADIOLOGY WITH ANESTHESIA N/A 10/31/2019  Procedure: IR WITH ANESTHESIA;  Surgeon: Radiologist, Medication, MD;  Location: [place];  Service: Radiology;  Laterality: N/A; . TRANSTHORACIC ECHOCARDIOGRAM  05/2018  Normal LV size and thickness.  EF estimated 50%.  Inferobasal HK and flattened septum c/w with elevated PA pressures. Mild functional mitral stenosis at rest  (postoperative).  Moderate left atrial dilation and mild right atrial dilation.  Mean PA pressures estimated 52 mmHg. HPI: 64 y.o. female presented [facility] ED as a code stroke for R gaze deviation and L-sided weakness. CT revealed a large R MCA infarct. Pt s/p R hemicraniectomy with bone flap placed in abdomen on 4/18. ETT 4/18-4/25. PMH HTN, DM2, chronic a. Fib (coumadin) s/p pace maker.  Subjective: pt mostly keeps eyes closed, does follow some commands Assessment / Plan / Recommendation CHL IP CLINICAL IMPRESSIONS 11/14/2019 Clinical Impression   Pt demonstrates excellent arousal and participation in MBS today. Pt able to sustain eye opening once up in MBS chair, grasp cup lift head and initaite straw sips or self feed puree with a spoon. She had more significant struggle paricipating when being fed with total assist with a spoon. Primary impairment is oral phase dysphagia with decreased automaticity of lingual paricipation in bolus formation and posterior propulsion. Pt uses a slow thrusting movement for gradual spillage to the pharynx. Upright, neutral head posture is important to aid oral transit. There is pooling in the pharynx with all boluses as pt completes oral transit and triggers swallow. This process is slowest with spoons of puree and nectar and improved with self fed straw sips of nectar. Sensed aspiration only occurred with thin liquids via straw, though cough was insufficient to eject. Pt also had some mild oral residuals with all boluses that coated base of tongue and pharyngeal sinuses post swallow. Working on a dry swallow would be a good therapy target. Will recommend puree diet with nectar thick liquids via straw and f/u closely for tolerance.  SLP Visit Diagnosis Dysphagia, oropharyngeal phase (R13.12) Attention and concentration deficit following -- Frontal lobe and executive function deficit following -- Impact on safety and function Moderate aspiration risk   CHL IP TREATMENT RECOMMENDATION  11/14/2019 Treatment Recommendations Therapy as outlined in treatment plan below   Prognosis 11/14/2019 Prognosis for Safe Diet Advancement Good Barriers to Reach Goals -- Barriers/Prognosis Comment -- CHL IP DIET RECOMMENDATION 11/14/2019 SLP Diet Recommendations Dysphagia 1 (Puree) solids Liquid Administration via Spoon Medication Administration -- Compensations Slow rate;Small sips/bites Postural Changes --   CHL IP OTHER RECOMMENDATIONS 11/14/2019 Recommended Consults -- Oral Care Recommendations -- Other Recommendations Have oral suction available   CHL IP FOLLOW UP RECOMMENDATIONS 11/14/2019 Follow up Recommendations Inpatient Rehab   CHL IP FREQUENCY AND DURATION 11/14/2019 Speech Therapy Frequency (ACUTE ONLY) min 2x/week Treatment Duration 2 weeks      CHL IP ORAL PHASE 11/14/2019 Oral Phase Impaired Oral - Pudding Teaspoon -- Oral - Pudding Cup -- Oral - Honey Teaspoon -- Oral - Honey Cup -- Oral - Nectar Teaspoon Lingual pumping;Reduced posterior propulsion;Left anterior bolus loss;Right anterior bolus loss;Lingual/palatal residue;Decreased bolus cohesion;Delayed oral transit;Premature spillage Oral - Nectar Cup Lingual pumping;Reduced posterior propulsion;Left anterior bolus loss;Right anterior bolus loss;Lingual/palatal residue;Decreased bolus cohesion;Delayed oral transit;Premature spillage Oral - Nectar Straw Lingual pumping;Reduced posterior propulsion;Lingual/palatal residue;Decreased bolus cohesion;Delayed oral transit;Premature spillage Oral - Thin Teaspoon Lingual pumping;Reduced posterior propulsion;Lingual/palatal residue;Decreased bolus cohesion;Delayed oral transit;Premature spillage Oral - Thin Cup -- Oral - Thin Straw Lingual pumping;Reduced  posterior propulsion;Lingual/palatal residue;Decreased bolus cohesion;Delayed oral transit;Premature spillage Oral - Puree Lingual pumping;Reduced posterior propulsion;Lingual/palatal residue;Decreased bolus cohesion;Delayed oral transit;Premature spillage  Oral - Mech Soft -- Oral - Regular -- Oral - Multi-Consistency -- Oral - Pill -- Oral Phase - Comment --  CHL IP PHARYNGEAL PHASE 11/14/2019 Pharyngeal Phase Impaired Pharyngeal- Pudding Teaspoon -- Pharyngeal -- Pharyngeal- Pudding Cup -- Pharyngeal -- Pharyngeal- Honey Teaspoon -- Pharyngeal -- Pharyngeal- Honey Cup -- Pharyngeal -- Pharyngeal- Nectar Teaspoon Delayed swallow initiation-pyriform sinuses Pharyngeal -- Pharyngeal- Nectar Cup Delayed swallow initiation-pyriform sinuses Pharyngeal -- Pharyngeal- Nectar Straw Delayed swallow initiation-pyriform sinuses Pharyngeal -- Pharyngeal- Thin Teaspoon Delayed swallow initiation-pyriform sinuses Pharyngeal -- Pharyngeal- Thin Cup -- Pharyngeal -- Pharyngeal- Thin Straw Delayed swallow initiation-pyriform sinuses;Penetration/Aspiration during swallow;Penetration/Apiration after swallow;Moderate aspiration Pharyngeal Material enters airway, passes BELOW cords and not ejected out despite cough attempt by patient Pharyngeal- Puree Delayed swallow initiation-pyriform sinuses Pharyngeal -- Pharyngeal- Mechanical Soft -- Pharyngeal -- Pharyngeal- Regular -- Pharyngeal -- Pharyngeal- Multi-consistency -- Pharyngeal -- Pharyngeal- Pill -- Pharyngeal -- Pharyngeal Comment --  No flowsheet data found. DeBlois, Katherene Ponto 11/14/2019, 1:57 PM                PHYSICAL EXAM       Temp:  [97.7 F (36.5 C)-98.9 F (37.2 C)] 98.2 F (36.8 C) (05/01 1248) Pulse Rate:  [37-117] 100 (05/01 1248) Resp:  [16-28] 24 (05/01 1248) BP: (97-145)/(48-107) 132/88 (05/01 1213) SpO2:  [96 %-100 %] 100 % (05/01 1248)  General - Well nourished, well developed, lethargic but eyes open.  Ophthalmologic - fundi not visualized due to noncooperation.  Cardiovascular - irregularly irregular heart rate and rhythm.  Neuro - awake, eyes open, lethargic, following simple commands on the right, able to repeat and name, but moderate dysarthria. Left lower quadrantanopia. Right gaze  preference, barely cross midline. Left facial droop, left UE 0/5 flaccid LLE slight withdraw to pain. RUE proximal 3/5 and distal 4/5 and RLE 3/5 proximal and 4/5 distally. Left side diminished muscle tone. Negative babinski. Sensation, coordination not cooperative and gait not tested.    ASSESSMENT/PLAN Krystal Little is a 64 y.o. female with history of HTN, DM2, hx of mitral valve repair, chronic a. Fib (coumadin - INR 1.2 on admission) s/p pacemaker who presented to [facility] ED as a code stroke for right gaze deviation, urinary incontinence and left sided weakness. She did not receive IV t-PA due to anticoagulation with coumadin. IR -> ICA terminus occlusion. Mechanical thrombectomy performed with 2 stent retriever + aspiration passes in the MCA-ICA (solitaire and embotrap), 1 stent retriever + aspiration pass MCA (embotrap) and 1 direct contact aspiration in the right ACA. Complete recanalization achieved (TICI3).  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 Cannot exclude etiology as embolic secondary to atrial fibrillation on warfarin with subtherapeutic INR  CT Head -  Large right MCA territory nonhemorrhagic infarct is described. Hyperdense right MCA suggesting extensive thrombus from the right ICA terminus through the MCA bifurcation Some mass effect with partial effacement the sulci on the right. No midline shift. ASPECTS is 4/10   CTA H&N - Total occlusion of right ICA terminus. Thrombus extends through the right MCA bifurcation with poor collaterals.   CT Perfusion - Confirmed large right MCA territory infarct with core volume of 107 mL. Large mismatch volume of 9.6 with a mismatch ratio of 1.9.   CT head -  Large area of hypoattenuation within the  right MCA territory, consistent with subacute infarct. Multifocal hyperdensity within the right MCA territory is consistent with hemorrhage or contrast staining. The more peripheral  hyperdensities are favored to be hemorrhagic. 6 mm leftward midline shift  CT Head 11/02/19 - Worsened edema within the right MCA territory with increased size of right basal ganglia intraparenchymal hematoma. Increased amount of subarachnoid blood over the right hemisphere. 11 mm of leftward midline shift  MRI head - not performed - PPM   CT head 4/20 postop changed from R crani. Evolution large R MCA infarct w/ interval increase edema/swelling. Increased petechial hemorrhage throughout. SAH also increased. R BG HT slightly increased. Mass effect, partial effacement ventricles w/ 26mm L midline shift   CT 4/22 stable large R MCA infarct w/ diffuse petechial hemorrhage   CT Head 11/09/19 - Continued interval evolution of large right MCA territory infarct with associated hemorrhagic transformation, relatively stable as compared to 11/06/2019. Associated mass effect with up to 12 mm of right-to-left shift not significantly changed. Stable asymmetric dilatation of the left lateral ventricle.  CT head pending  2D Echo - EF 50-55%. No source of embolus. 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   Sars Corona Virus 2 - negative  LDL - 158  HgbA1c - 9.7  UDS - negative  VTE prophylaxis - SCDs  warfarin daily prior to admission, now on No antithrombotic. Will consider ASA or AC based on CT repeat tomorrow.  Therapy recommendations:  CIR  Disposition:  Pending  Acute hypoxemia respiratory failure d/t stroke  Intubated for emergent hemicraniectomy for cerebral edema  Now extubated  CXR 4/20 - Complete opacification of the retrocardiac left lung base worrisome for left lower lobe atelectasis.  CXR 4/22 B pulm infiltrates / edema and B pleural effusions  CXR 4/23 - Bilateral pleural effusions, right greater than left, with associated opacity, likely atelectasis.  CXR 11/09/19 - Stable small right effusion with underlying atelectasis. Stableeft retrocardiac opacity. The small  layering left effusion seen previously is less prominent.  CXR 4/27: pleural effusions. Pt tachyonic, but no acute distress  Lasix 40 IV given x1  CXR pending  Cerebral edema w/ subfalcine herniation s/p decompressive hemicraniectomy  CT 4/22 stable large R MCA infarct w/ diffuse petechial hemorrhage  3% saline @ 50 cc/hr -> NS @ 50 -> off  Na 138->...-151-150-150->148->148->151->144  NSG on board s/p [facility] 4/18  PICC line placed  23.4% x 2  Atrial fibrillation w/ RVR  Telemetry showed A. fib, rate controlled  On Coumadin PTA, family stated compliance  Subtherapeutic INR 1.2 on presentation -> 1.4  No AC at the time due to large infarct and hemorrhagic conversion (previously refused DOACs)  metoprolol 50 bid -> 25 bid  On cardizem 60 q 8h->60 q 6h  amiodarone gtt converted to po 4/22 - 400 bid x 1 week then 400 daily -> now discontinued  EP Cardiology on board  HR stable, on the low side - decrease metoprolol to 25 bid  Hypertension->Hypotension  Home BP meds: Atenolol; Cardizem ; lisinopril  Current BP meds: cardizem, metoprolol 50 bid  Off cleviprex  lisinopril d/c due to low BP  BP improved  On metoprolol 25 bid -> 50 bid -> 25 bid . SBP goal < 160 mm Hg . Long-term BP goal normotensive  Hyperlipidemia  Home Lipid lowering medication: zetia  LDL 158, goal < 70  Hold off statin for know due to left hemorrhagic conversion  Consider statin at discharge  Diabetes, uncontrolled  Home diabetic  meds: metformin  HgbA1c 9.7, goal < 7.0  CBG monitoring  Hyperglycemia - on lantus 20 bid, novolog 9u nocturnal TF coverage     Continue SSI 0-9  DM coordinator consulted  Dysphagia  NPO  Has cortrak, start on nocturnal feeding  Insulin adjusted for nocturnal feeding  Free water  Dietitian consult for caloric count  Speech following  Other Stroke Risk Factors  Advanced age  ETOH use, advised to drink no more than 1 alcoholic beverage  per day.  Other Active Problems  Code status - Full code  SSS with pacemaker, not compatible with MRI  Hypokalemia - 3.6 ->resolved   Leukocytosis WBC 12.7->12.6->11.9  Add amandtadine      Hospital day # 15  I spent  35 minutes in total face-to-face time with the patient, more than 50% of which was spent in counseling and coordination of care, reviewing test results, images and medication, and discussing the diagnosis of large right MCA stroke s/p craniectomy, hyperglycemia, brain edema, dysphagia, treatment plan and potential prognosis. This patient's care requiresreview of multiple databases, neurological assessment, discussion with family, other specialists and medical decision making of high complexity.  Krystal Hawking, MD PhD Stroke Neurology 11/15/2019 1:38 PM  To contact Stroke Continuity provider, please refer to [email or web address] After hours, contact General Neurology

## 2019-11-15 NOTE — Plan of Care (Signed)
   Spoke with Dr Donne Hazel from Trauma service and cancelled PEG placement for Monday as per Dr Erlinda Hong.  Mikey Bussing PA-C [facility] Pager [phone] 11/15/2019, 2:18 PM

## 2019-11-15 NOTE — Plan of Care (Signed)
  Problem: Education: Goal: Knowledge of disease or condition will improve Outcome: Progressing   Problem: Coping: Goal: Will verbalize positive feelings about self Outcome: Progressing   Problem: Nutrition: Goal: Dietary intake will improve Outcome: Progressing

## 2019-11-16 ENCOUNTER — Inpatient Hospital Stay (HOSPITAL_COMMUNITY): Payer: Medicare Other

## 2019-11-16 DIAGNOSIS — I63411 Cerebral infarction due to embolism of right middle cerebral artery: Secondary | ICD-10-CM | POA: Diagnosis not present

## 2019-11-16 DIAGNOSIS — I482 Chronic atrial fibrillation, unspecified: Secondary | ICD-10-CM | POA: Diagnosis not present

## 2019-11-16 DIAGNOSIS — E1165 Type 2 diabetes mellitus with hyperglycemia: Secondary | ICD-10-CM | POA: Diagnosis not present

## 2019-11-16 DIAGNOSIS — E1159 Type 2 diabetes mellitus with other circulatory complications: Secondary | ICD-10-CM | POA: Diagnosis not present

## 2019-11-16 LAB — CBC
HCT: 30.3 % — ABNORMAL LOW (ref 36.0–46.0)
Hemoglobin: 9.5 g/dL — ABNORMAL LOW (ref 12.0–15.0)
MCH: 30.5 pg (ref 26.0–34.0)
MCHC: 31.4 g/dL (ref 30.0–36.0)
MCV: 97.4 fL (ref 80.0–100.0)
Platelets: 353 10*3/uL (ref 150–400)
RBC: 3.11 MIL/uL — ABNORMAL LOW (ref 3.87–5.11)
RDW: 15.9 % — ABNORMAL HIGH (ref 11.5–15.5)
WBC: 11.5 10*3/uL — ABNORMAL HIGH (ref 4.0–10.5)
nRBC: 0.3 % — ABNORMAL HIGH (ref 0.0–0.2)

## 2019-11-16 LAB — GLUCOSE, CAPILLARY
Glucose-Capillary: 102 mg/dL — ABNORMAL HIGH (ref 70–99)
Glucose-Capillary: 104 mg/dL — ABNORMAL HIGH (ref 70–99)
Glucose-Capillary: 122 mg/dL — ABNORMAL HIGH (ref 70–99)
Glucose-Capillary: 127 mg/dL — ABNORMAL HIGH (ref 70–99)
Glucose-Capillary: 153 mg/dL — ABNORMAL HIGH (ref 70–99)
Glucose-Capillary: 167 mg/dL — ABNORMAL HIGH (ref 70–99)

## 2019-11-16 LAB — BASIC METABOLIC PANEL
Anion gap: 7 (ref 5–15)
BUN: 18 mg/dL (ref 8–23)
CO2: 25 mmol/L (ref 22–32)
Calcium: 7.9 mg/dL — ABNORMAL LOW (ref 8.9–10.3)
Chloride: 107 mmol/L (ref 98–111)
Creatinine, Ser: 0.59 mg/dL (ref 0.44–1.00)
GFR calc Af Amer: >60 mL/min (ref >60)
GFR calc non Af Amer: >60 mL/min (ref >60)
Glucose, Bld: 144 mg/dL — ABNORMAL HIGH (ref 70–99)
Potassium: 3.5 mmol/L (ref 3.5–5.1)
Sodium: 139 mmol/L (ref 135–145)

## 2019-11-16 MED ORDER — ATORVASTATIN CALCIUM 40 MG PO TABS
40.0000 mg | ORAL_TABLET | Freq: Every day | ORAL | Status: DC
  Filled 2019-11-16: qty 1

## 2019-11-16 MED ORDER — ASPIRIN 325 MG PO TABS
325.0000 mg | ORAL_TABLET | Freq: Every day | ORAL | Status: DC
  Filled 2019-11-16: qty 1

## 2019-11-16 MED ORDER — ASPIRIN 325 MG PO TABS
325.0000 mg | ORAL_TABLET | Freq: Every day | ORAL | Status: DC
  Administered 2019-11-16 – 2019-11-20 (×5): 325 mg
  Filled 2019-11-16 (×4): qty 1

## 2019-11-16 MED ORDER — ATORVASTATIN CALCIUM 40 MG PO TABS
40.0000 mg | ORAL_TABLET | Freq: Every day | ORAL | Status: DC
  Administered 2019-11-16 – 2019-11-20 (×5): 40 mg
  Filled 2019-11-16 (×4): qty 1

## 2019-11-16 NOTE — Anesthesia Preprocedure Evaluation (Deleted)
Anesthesia Evaluation    Reviewed: Allergy & Precautions, H&P , Patient's Chart, lab work & pertinent test results  Airway        Dental   Pulmonary neg pulmonary ROS,           Cardiovascular Exercise Tolerance: Good hypertension, Pt. on medications + dysrhythmias Atrial Fibrillation + pacemaker   S/p MVR  Normal LV size and thickness.  EF estimated 50%.  Inferobasal HK and flattened septum c/w with elevated PA pressures. Mild functional mitral stenosis at rest (postoperative).  Moderate left atrial dilation and mild right atrial dilation.  Mean PA pressures estimated 52 mmHg.   Neuro/Psych CVA negative psych ROS   GI/Hepatic negative GI ROS, Neg liver ROS,   Endo/Other  negative endocrine ROSdiabetes, Type 2  Renal/GU negative Renal ROS  negative genitourinary   Musculoskeletal   Abdominal   Peds  Hematology negative hematology ROS (+)   Anesthesia Other Findings   Reproductive/Obstetrics negative OB ROS                             Anesthesia Physical Anesthesia Plan  ASA: III  Anesthesia Plan: MAC   Post-op Pain Management:    Induction:   PONV Risk Score and Plan: 2  Airway Management Planned: Nasal Cannula, Simple Face Mask and Mask  Additional Equipment:   Intra-op Plan:   Post-operative Plan:   Informed Consent: I have reviewed the patients History and Physical, chart, labs and discussed the procedure including the risks, benefits and alternatives for the proposed anesthesia with the patient or authorized representative who has indicated his/her understanding and acceptance.       Plan Discussed with: Anesthesiologist  Anesthesia Plan Comments:         Anesthesia Quick Evaluation

## 2019-11-16 NOTE — Plan of Care (Signed)
  Problem: Education: ?Goal: Knowledge of disease or condition will improve ?Outcome: Progressing ?  ?Problem: Self-Care: ?Goal: Ability to communicate needs accurately will improve ?Outcome: Progressing ?  ?Problem: Nutrition: ?Goal: Dietary intake will improve ?Outcome: Progressing ?  ?

## 2019-11-16 NOTE — Progress Notes (Signed)
STROKE TEAM PROGRESS NOTE   INTERVAL HISTORY [name].  Patient lethargic but open eyes on voice, able to eat small amount of lunch with assistance from nurse aide.  On nocturnal tube feeding and calorie count.  Glucose stable.  No fever.  OBJECTIVE Vitals:   11/16/19 0725 11/16/19 0745 11/16/19 0927 11/16/19 1154  BP: 113/73  116/61 (!) 142/71  Pulse: 77   64  Resp: 20   (!) 22  Temp: 98.9 F (37.2 C)   98.6 F (37 C)  TempSrc: Oral   Oral  SpO2:  100%  100%  Weight:      Height:        CBC:  Recent Labs  Lab 11/15/19 0520 11/16/19 0420  WBC 11.9* 11.5*  HGB 9.8* 9.5*  HCT 31.3* 30.3*  MCV 99.7 97.4  PLT 346 626   Basic Metabolic Panel:  Recent Labs  Lab 11/11/19 0454 11/11/19 0454 11/12/19 0445 11/12/19 0445 11/15/19 0520 11/16/19 0420  NA 149*   < > 151*   < > 144 139  K 3.9   < > 3.6   < > 3.5 3.5  CL 110   < > 111   < > 107 107  CO2 30   < > 31   < > 26 25  GLUCOSE 186*   < > 135*   < > 198* 144*  BUN 32*   < > 28*   < > 19 18  CREATININE 0.86   < > 0.89   < > 0.63 0.59  CALCIUM 8.2*   < > 8.4*   < > 8.1* 7.9*  MG 2.8*  --  2.5*  --   --   --   PHOS 3.5  --  4.0  --   --   --    < > = values in this interval not displayed.   IMAGING past 24h   CT HEAD WO CONTRAST  Result Date: 11/16/2019 CLINICAL DATA:  Stroke. Postop. EXAM: CT HEAD WITHOUT CONTRAST TECHNIQUE: Contiguous axial images were obtained from the base of the skull through the vertex without intravenous contrast. COMPARISON:  11/09/2019 FINDINGS: Brain: There is decreased right hemispheric edema with decreased leftward midline shift that now measures 5 mm, previously 10 mm. Decreased density of hemorrhage in the right hemisphere. Vascular: Unchanged Skull: Large right craniectomy, unchanged. Sinuses/Orbits: No fluid levels or advanced mucosal thickening of the visualized paranasal sinuses. No mastoid or middle ear effusion. The orbits are normal. IMPRESSION: Decreased right hemispheric  edema and decreased leftward midline shift. Electronically Signed   By: Ulyses Jarred M.D.   On: 11/16/2019 04:42     PHYSICAL EXAM   Temp:  [97.5 F (36.4 C)-98.9 F (37.2 C)] 98.6 F (37 C) (05/02 1154) Pulse Rate:  [60-96] 64 (05/02 1154) Resp:  [18-22] 22 (05/02 1154) BP: (98-149)/(56-104) 142/71 (05/02 1154) SpO2:  [100 %] 100 % (05/02 1154) FiO2 (%):  [21 %] 21 % (05/01 2046) Weight:  [62.2 kg] 62.2 kg (05/02 0500)  General - Well nourished, well developed, lethargic but eyes open.  Ophthalmologic - fundi not visualized due to noncooperation.  Cardiovascular - irregularly irregular heart rate and rhythm.  Neuro - awake, eyes open, lethargic, following simple commands on the right, able to repeat and name, but moderate dysarthria. Left lower quadrantanopia. Right gaze preference, barely cross midline. Left facial droop, left UE 0/5 flaccid LLE slight withdraw to pain. RUE proximal 3/5 and distal 4/5 and RLE 3/5  proximal and 4/5 distally. Left side diminished muscle tone. Negative babinski. Sensation, coordination not cooperative and gait not tested.    ASSESSMENT/PLAN [name] is a 64 y.o. female with history of HTN, DM2, hx of mitral valve repair, chronic a. Fib (coumadin - INR 1.2 on admission) s/p pacemaker who presented to [facility] ED as a code stroke for right gaze deviation, urinary incontinence and left sided weakness. She did not receive IV t-PA due to anticoagulation with coumadin. IR -> ICA terminus occlusion. Mechanical thrombectomy performed with 2 stent retriever + aspiration passes in the MCA-ICA (solitaire and embotrap), 1 stent retriever + aspiration pass MCA (embotrap) and 1 direct contact aspiration in the right ACA. Complete recanalization achieved (TICI3).  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 Cannot exclude etiology as embolic secondary to atrial fibrillation on warfarin with subtherapeutic  INR  CT Head -  Large right MCA territory nonhemorrhagic infarct is described. Hyperdense right MCA suggesting extensive thrombus from the right ICA terminus through the MCA bifurcation Some mass effect with partial effacement the sulci on the right. No midline shift. ASPECTS is 4/10   CTA H&N - Total occlusion of right ICA terminus. Thrombus extends through the right MCA bifurcation with poor collaterals.   CT Perfusion - Confirmed large right MCA territory infarct with core volume of 107 mL. Large mismatch volume of 9.6 with a mismatch ratio of 1.9.   CT head -  Large area of hypoattenuation within the right MCA territory, consistent with subacute infarct. Multifocal hyperdensity within the right MCA territory is consistent with hemorrhage or contrast staining. The more peripheral hyperdensities are favored to be hemorrhagic. 6 mm leftward midline shift  CT Head 11/02/19 - Worsened edema within the right MCA territory with increased size of right basal ganglia intraparenchymal hematoma. Increased amount of subarachnoid blood over the right hemisphere. 11 mm of leftward midline shift  MRI head - not performed - PPM   CT head 4/20 postop changed from R crani. Evolution large R MCA infarct w/ interval increase edema/swelling. Increased petechial hemorrhage throughout. SAH also increased. R BG HT slightly increased. Mass effect, partial effacement ventricles w/ 69mm L midline shift   CT 4/22 stable large R MCA infarct w/ diffuse petechial hemorrhage   CT Head 11/09/19 - Continued interval evolution of large right MCA territory infarct with associated hemorrhagic transformation, relatively stable as compared to 11/06/2019. Associated mass effect with up to 12 mm of right-to-left shift not significantly changed. Stable asymmetric dilatation of the left lateral ventricle.  CT head - 11/16/19 - Decreased right hemispheric edema and decreased leftward midline shift.  2D Echo - EF 50-55%. No source of  embolus. 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   Sars Corona Virus 2 - negative  LDL - 158  HgbA1c - 9.7  UDS - negative  VTE prophylaxis - SCDs  warfarin daily prior to admission, now on ASA 325mg . Will consider resume AC in a week or so.  Therapy recommendations:  CIR  Disposition:  Pending  Acute hypoxemia respiratory failure d/t stroke  Intubated for emergent hemicraniectomy for cerebral edema  Now extubated  CXR 4/20 - Complete opacification of the retrocardiac left lung base worrisome for left lower lobe atelectasis.  CXR 4/22 B pulm infiltrates / edema and B pleural effusions  CXR 4/23 - Bilateral pleural effusions, right greater than left, with associated opacity, likely atelectasis.  CXR 11/09/19 - Stable small  right effusion with underlying atelectasis. Stableeft retrocardiac opacity. The small layering left effusion seen previously is less prominent.  CXR 4/27: pleural effusions. Pt tachyonic, but no acute distress  Lasix 40 IV given x1  CXR 11/15/19 - Persistent bilateral pleural effusions, left greater than right. Persistent retrocardiac opacity favored to represent atelectasis.  Cerebral edema w/ subfalcine herniation s/p decompressive hemicraniectomy  CT 11/16/19 - Decreased right hemispheric edema and decreased leftward midline shift.  3% saline @ 50 cc/hr -> NS @ 50 -> off  Na 138->...-151-150-150->148->148->151->144->139  NSG on board s/p [facility] 4/18  PICC line placed  23.4% x 2  Atrial fibrillation w/ RVR  Telemetry showed A. fib, rate controlled  On Coumadin PTA, family stated compliance  Subtherapeutic INR 1.2 on presentation -> 1.4  No AC at the time due to large infarct and hemorrhagic conversion (previously refused DOACs)  metoprolol 50 bid -> 25 bid  On cardizem 60 q 8h->60 q 6h  amiodarone gtt converted to po 4/22 - 400 bid x 1 week then 400 daily -> now discontinued  EP Cardiology on board  HR stable, on the low side  - decrease metoprolol to 25 bid  On aspirin 325, will consider resume AC in 1 week  Hypertension->Hypotension  Home BP meds: Atenolol; Cardizem ; lisinopril  Current BP meds: cardizem, metoprolol 50 bid  Off cleviprex  lisinopril d/c due to low BP  BP improved and stable  On metoprolol 25 bid -> 50 bid -> 25 bid . SBP goal < 160 mm Hg . Long-term BP goal normotensive  Hyperlipidemia  Home Lipid lowering medication: zetia  LDL 158, goal < 70  Now on lipitor 40  Continue statin at discharge  Diabetes, uncontrolled  Home diabetic meds: metformin  HgbA1c 9.7, goal < 7.0  CBG monitoring  Hyperglycemia - on lantus 20 bid, novolog 9u nocturnal TF coverage     Continue SSI 0-9  DM coordinator consulted  Dysphagia  NPO  Has cortrak, start on nocturnal feeding  Insulin adjusted for nocturnal feeding  Free water  Dietitian consult for caloric count  Speech following  Other Stroke Risk Factors  Advanced age  ETOH use, advised to drink no more than 1 alcoholic beverage per day.  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 After hours, contact General Neurology

## 2019-11-17 ENCOUNTER — Encounter (HOSPITAL_COMMUNITY)
Admission: EM | Disposition: A | Discharge status: Rehabilitation Facility | Payer: Self-pay | Source: Home / Self Care | Attending: Neurology

## 2019-11-17 DIAGNOSIS — E1165 Type 2 diabetes mellitus with hyperglycemia: Secondary | ICD-10-CM | POA: Diagnosis not present

## 2019-11-17 DIAGNOSIS — I63411 Cerebral infarction due to embolism of right middle cerebral artery: Secondary | ICD-10-CM | POA: Diagnosis not present

## 2019-11-17 DIAGNOSIS — I482 Chronic atrial fibrillation, unspecified: Secondary | ICD-10-CM | POA: Diagnosis not present

## 2019-11-17 DIAGNOSIS — E876 Hypokalemia: Secondary | ICD-10-CM | POA: Diagnosis not present

## 2019-11-17 LAB — BASIC METABOLIC PANEL
Anion gap: 6 (ref 5–15)
BUN: 17 mg/dL (ref 8–23)
CO2: 27 mmol/L (ref 22–32)
Calcium: 7.9 mg/dL — ABNORMAL LOW (ref 8.9–10.3)
Chloride: 104 mmol/L (ref 98–111)
Creatinine, Ser: 0.65 mg/dL (ref 0.44–1.00)
GFR calc Af Amer: >60 mL/min (ref >60)
GFR calc non Af Amer: >60 mL/min (ref >60)
Glucose, Bld: 175 mg/dL — ABNORMAL HIGH (ref 70–99)
Potassium: 3.5 mmol/L (ref 3.5–5.1)
Sodium: 137 mmol/L (ref 135–145)

## 2019-11-17 LAB — GLUCOSE, CAPILLARY
Glucose-Capillary: 200 mg/dL — ABNORMAL HIGH (ref 70–99)
Glucose-Capillary: 205 mg/dL — ABNORMAL HIGH (ref 70–99)
Glucose-Capillary: 223 mg/dL — ABNORMAL HIGH (ref 70–99)
Glucose-Capillary: 243 mg/dL — ABNORMAL HIGH (ref 70–99)
Glucose-Capillary: 248 mg/dL — ABNORMAL HIGH (ref 70–99)
Glucose-Capillary: 260 mg/dL — ABNORMAL HIGH (ref 70–99)
Glucose-Capillary: 263 mg/dL — ABNORMAL HIGH (ref 70–99)
Glucose-Capillary: 287 mg/dL — ABNORMAL HIGH (ref 70–99)
Glucose-Capillary: 76 mg/dL (ref 70–99)

## 2019-11-17 LAB — CBC
HCT: 29.5 % — ABNORMAL LOW (ref 36.0–46.0)
Hemoglobin: 9.4 g/dL — ABNORMAL LOW (ref 12.0–15.0)
MCH: 30.9 pg (ref 26.0–34.0)
MCHC: 31.9 g/dL (ref 30.0–36.0)
MCV: 97 fL (ref 80.0–100.0)
Platelets: 335 10*3/uL (ref 150–400)
RBC: 3.04 MIL/uL — ABNORMAL LOW (ref 3.87–5.11)
RDW: 15.9 % — ABNORMAL HIGH (ref 11.5–15.5)
WBC: 9.5 10*3/uL (ref 4.0–10.5)
nRBC: 0 % (ref 0.0–0.2)

## 2019-11-17 SURGERY — ESOPHAGOGASTRODUODENOSCOPY (EGD) WITH PROPOFOL
Anesthesia: Monitor Anesthesia Care

## 2019-11-17 MED ORDER — SODIUM CHLORIDE 3 % IN NEBU
4.0000 mL | INHALATION_SOLUTION | RESPIRATORY_TRACT | Status: DC | PRN
  Filled 2019-11-17: qty 4

## 2019-11-17 MED ORDER — INSULIN GLARGINE 100 UNIT/ML ~~LOC~~ SOLN
25.0000 [IU] | Freq: Two times a day (BID) | SUBCUTANEOUS | Status: DC
  Administered 2019-11-17 – 2019-11-19 (×4): 25 [IU] via SUBCUTANEOUS
  Filled 2019-11-17 (×5): qty 0.25

## 2019-11-17 MED ORDER — ADULT MULTIVITAMIN W/MINERALS CH
1.0000 | ORAL_TABLET | Freq: Every day | ORAL | Status: DC
  Administered 2019-11-17 – 2019-11-20 (×4): 1 via ORAL
  Filled 2019-11-17 (×4): qty 1

## 2019-11-17 MED ORDER — INSULIN ASPART 100 UNIT/ML ~~LOC~~ SOLN
10.0000 [IU] | Freq: Two times a day (BID) | SUBCUTANEOUS | Status: DC
  Administered 2019-11-17 – 2019-11-18 (×3): 10 [IU] via SUBCUTANEOUS

## 2019-11-17 MED ORDER — INSULIN ASPART 100 UNIT/ML ~~LOC~~ SOLN: 9.0000 [IU] | Freq: Two times a day (BID) | SUBCUTANEOUS | Status: DC

## 2019-11-17 MED ORDER — RESOURCE THICKENUP CLEAR PO POWD
ORAL | Status: DC | PRN
  Filled 2019-11-17: qty 125

## 2019-11-17 MED ORDER — GLUCERNA 1.5 CAL PO LIQD
500.0000 mL | ORAL | Status: DC
  Administered 2019-11-17 – 2019-11-19 (×3): 500 mL
  Filled 2019-11-17 (×4): qty 711

## 2019-11-17 NOTE — Progress Notes (Signed)
  Speech Language Pathology Treatment: Dysphagia;Cognitive-Linquistic  Patient Details Name: Krystal Little MRN: [id number] DOB: 06-Aug-1955 Today's Date: 11/17/2019 Time: 3128-1188 SLP Time Calculation (min) (ACUTE ONLY): 17 min  Assessment / Plan / Recommendation Clinical Impression  Therapy session targeting 1) facilitating safe self feeding: total assist for safe position, 2 verbal and tactile cues to help pt position straw to right side, pt carried over x1. 2)Attention to left visual field, placed motivating items at midline or left with constant verbal cueing needed with visual cues as well to draw gaze left x10. 3) following two step directions pt needed three repetitions to correctly follow task. 4) increased volume counting to 5, repeated x4 with visual feedback (video), carried over to requests x1. 5) awareness of environment and self care, pt requested instruction on how to call for help and and answer the phone, environment set up for increased independence. 6) short term memory; pt able to recall items consumed at am meal x3.  Pt making fantastic gains. Recommend CIR  HPI HPI: 64 y.o. female presented to [facility] ED as a code stroke for R gaze deviation and L-sided weakness. CT revealed a large R MCA infarct. Pt s/p R hemicraniectomy with bone flap placed in abdomen on 4/18. ETT 4/18-4/25. PMH HTN, DM2, chronic a. Fib (coumadin) s/p pace maker.      SLP Plan  Continue with current plan of care       Recommendations  Diet recommendations: Dysphagia 1 (puree);Nectar-thick liquid Liquids provided via: Straw Medication Administration: Crushed with puree Supervision: Patient able to self feed;Full supervision/cueing for compensatory strategies Compensations: Slow rate;Small sips/bites Postural Changes and/or Swallow Maneuvers: Seated upright 90 degrees                General recommendations: Rehab consult Oral Care Recommendations: Oral care BID Follow up Recommendations: Inpatient  Rehab Plan: Continue with current plan of care       GO               Herbie Baltimore, MA [place] Pager [phone] Office [phone]  Lynann Beaver 11/17/2019, 9:18 AM

## 2019-11-17 NOTE — Progress Notes (Signed)
Nutrition Follow-up  RD working remotely.  DOCUMENTATION CODES:   Not applicable  INTERVENTION:   -Initiate 48 hour calorie count per MD -Feeding assistance with meals -D/c Glucerna shake -MVI with minerals daily -Magic cup TID with meals, each supplement provides 290 kcal and 9 grams of protein -Adjust nocturnal TF:   Glucerna 1.5 @ 50 ml/hr via cortrak tube over 10 hour period (2000-0600)  150 ml free water flush every 4 hours  Tube feeding regimen provides 1750 kcal (50% of needs), 41 grams of protein, and 380 ml of H2O. Total free water: 1280 ml daily  NUTRITION DIAGNOSIS:   Inadequate oral intake related to inability to eat as evidenced by NPO status.  Progressing; advanced to dysphagia 1 diet with nectar thick liquids, transitioned to nocturnal TF  GOAL:   Patient will meet greater than or equal to 90% of their needs  Progressing   MONITOR:   TF tolerance, Diet advancement, Labs  REASON FOR ASSESSMENT:   Consult Calorie Count  ASSESSMENT:   Pt with PMH of DM, chronic AF with pacemaker found down with R MCA stroke s/p IR thrombectomy.  4/18 s/p emergent decompressive hemicraniectomy due to vasogenic cerebral edema 4/25 extubated 4/30 Cortrak (tip in stomach); MBS, recommending D1/Nectars 5/1- MD transitioned to nocturnal feedings, requesting calorie count, PEG placement cancelled  Attempted to speak with pt via phone, however, no answer.   Per chart review, PEG placement has been cancelled. Pt remains somewhat lethargic and is being fed by staff. She is tolerating current diet texture well.   5/2 Breakfast: 328 kcals, 11 grams protein Lunch: 338 kcals, 14 grams protein Dinner: 196 kcals, 7 grams protein Supplements: 3 Glucerna shakes (750 kcals, 27 grams protein)  Total intake: 1612 kcal (100% of minimum estimated needs)  59 grams protein (78% of minimum estimated needs)  Pt consuming about 57% of estimated kcal needs and 41% of protein needs  via meals alone. She consumed about 25% of her breakfast today (191 kcals, 11 grams protein).   RD to continue calorie count and nocturnal feedings to ensure reliable adequacy of oral intake.   Labs reviewed: CBGS: 76-243 (inpatient orders for glycemic control are 0-9 units insulin aspart TID with meals, 9 units insulin aspart TID with meals, and 20 units insulin glargine BID).   Diet Order:   Diet Order            DIET - DYS 1 Room service appropriate? Yes; Fluid consistency: Nectar Thick  Diet effective now              EDUCATION NEEDS:   No education needs have been identified at this time  Skin:  Skin Assessment: Skin Integrity Issues: Skin Integrity Issues:: Incisions, Other (Comment) Incisions: pretibial venous stasis ulcer Other: incision groin, head, abdomen  Last BM:  11/16/19  Height:   Ht Readings from Last 1 Encounters:  10/31/19 5\' 4"  (1.626 m)    Weight:   Wt Readings from Last 1 Encounters:  11/16/19 62.2 kg    Ideal Body Weight:  54.5 kg  BMI:  Body mass index is 23.54 kg/m.  Estimated Nutritional Needs:   Kcal:  1500-1700  Protein:  76-85 grams  Fluid:  >1.6 L/day    Loistine Chance, RD, LDN, CDCES Registered Dietitian II Certified Diabetes Care and Education Specialist Please refer to [facility] for RD and/or RD on-call/weekend/after hours pager

## 2019-11-17 NOTE — Progress Notes (Addendum)
Inpatient Diabetes Program Recommendations  AACE/ADA: New Consensus Statement on Inpatient Glycemic Control (2015)  Target Ranges:  Prepandial:   less than 140 mg/dL      Peak postprandial:   less than 180 mg/dL (1-2 hours)      Critically ill patients:  140 - 180 mg/dL   Lab Results  Component Value Date   GLUCAP 76 11/17/2019   HGBA1C 9.7 (H) 11/01/2019    Review of Glycemic Control Results for Krystal Little, Krystal Little (MRN [id number]) as of 11/17/2019 09:19  Ref. Range 11/16/2019 04:12 11/16/2019 07:39 11/16/2019 11:34 11/16/2019 15:41 11/16/2019 21:20 11/17/2019 00:05 11/17/2019 00:24 11/17/2019 04:10 11/17/2019 08:06  Glucose-Capillary Latest Ref Range: 70 - 99 mg/dL 122 (H) 127 (H) 104 (H) 153 (H) 102 (H) 223 (H) 243 (H) 205 (H) 76   Diabetes history: DM 2 Outpatient Diabetes medications: metformin 1000 mg bid Current orders for Inpatient glycemic control:  Novolog 9 units tube feed coverage tid at 2000 0000 0400 Lantus 20 units bid Novolog 0-9 units tid  Inpatient Diabetes Program Recommendations:  Glucose high overnight after first dose of Tube feed coverage not given at 2000 even though glucose was 102 (may want to place parameters for tube feed coverage to give if glucose is > 90 mg/dl call physician if in doubt)  Watch for now  Thanks,  Tama Headings RN, MSN, BC-ADM Inpatient Diabetes Coordinator Team Pager [phone] (8a-5p)

## 2019-11-17 NOTE — Progress Notes (Signed)
Patient ID: Krystal Little, female   DOB: 22-Sep-1955, 64 y.o.   MRN: [id number] Now on D1 nectar thick diet. Doing fairly well per RN. Awake and F/C for me on R side. Cancel PEG. Please let us know if things change.  Georganna Skeans, MD, MPH, FACS Please use [email or web address] to contact on call provider

## 2019-11-17 NOTE — Progress Notes (Signed)
STROKE TEAM PROGRESS NOTE   INTERVAL HISTORY RN at bedside.  Patient awake alert, eyes open, right skull flap sunken, will remove dressing staples today. On nocturnal tube feeding, still has hyperglycemia, diabetic coordinator on board.  Dietitian on board for calorie count.  Pending CIR placement.  OBJECTIVE Vitals:   11/17/19 0806 11/17/19 0852 11/17/19 0903 11/17/19 1203  BP: 118/69  140/63 121/67  Pulse: 72 84  83  Resp: 18 19  18   Temp: 97.8 F (36.6 C)   98.7 F (37.1 C)  TempSrc: Axillary   Oral  SpO2: 99% 99%  98%  Weight:      Height:        CBC:  Recent Labs  Lab 11/16/19 0420 11/17/19 0500  WBC 11.5* 9.5  HGB 9.5* 9.4*  HCT 30.3* 29.5*  MCV 97.4 97.0  PLT 353 417   Basic Metabolic Panel:  Recent Labs  Lab 11/11/19 0454 11/11/19 0454 11/12/19 0445 11/15/19 0520 11/16/19 0420 11/17/19 0500  NA 149*   < > 151*   < > 139 137  K 3.9   < > 3.6   < > 3.5 3.5  CL 110   < > 111   < > 107 104  CO2 30   < > 31   < > 25 27  GLUCOSE 186*   < > 135*   < > 144* 175*  BUN 32*   < > 28*   < > 18 17  CREATININE 0.86   < > 0.89   < > 0.59 0.65  CALCIUM 8.2*   < > 8.4*   < > 7.9* 7.9*  MG 2.8*  --  2.5*  --   --   --   PHOS 3.5  --  4.0  --   --   --    < > = values in this interval not displayed.   IMAGING past 24h   No results found.   PHYSICAL EXAM   Temp:  [97.8 F (36.6 C)-99 F (37.2 C)] 98.7 F (37.1 C) (05/03 1203) Pulse Rate:  [59-85] 83 (05/03 1203) Resp:  [17-19] 18 (05/03 1203) BP: (108-140)/(63-82) 121/67 (05/03 1203) SpO2:  [98 %-100 %] 98 % (05/03 1203)  General - Well nourished, well developed, lethargic but eyes open.  Ophthalmologic - fundi not visualized due to noncooperation.  Cardiovascular - irregularly irregular heart rate and rhythm.  Neuro - awake, alert, eyes open, lethargic, following simple commands on the right, able to say short sentences, able to repeat and name, but moderate dysarthria. Left lower quadrantanopia. Right  gaze preference, barely cross midline. Left facial droop, left UE 0/5 flaccid LLE slight withdraw to pain. RUE proximal 3/5 and distal 4/5 and RLE 3/5 proximal and 4/5 distally. Left side diminished muscle tone. Negative babinski. Sensation, coordination not cooperative and gait not tested.   ASSESSMENT/PLAN [name] is a 64 y.o. female with history of HTN, DM2, hx of mitral valve repair, chronic a. Fib (coumadin - INR 1.2 on admission) s/p pacemaker who presented to [facility] ED as a code stroke for right gaze deviation, urinary incontinence and left sided weakness. She did not receive IV t-PA due to anticoagulation with coumadin. IR -> ICA terminus occlusion. Mechanical thrombectomy performed with 2 stent retriever + aspiration passes in the MCA-ICA (solitaire and embotrap), 1 stent retriever + aspiration pass MCA (embotrap) and 1 direct contact aspiration in the right ACA. Complete recanalization achieved (TICI3).  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 Cannot exclude etiology as embolic secondary to atrial fibrillation on warfarin with subtherapeutic INR  CT Head -  Large right MCA territory nonhemorrhagic infarct is described. Hyperdense right MCA suggesting extensive thrombus from the right ICA terminus through the MCA bifurcation Some mass effect with partial effacement the sulci on the right. No midline shift. ASPECTS is 4/10   CTA H&N - Total occlusion of right ICA terminus. Thrombus extends through the right MCA bifurcation with poor collaterals.   CT Perfusion - Confirmed large right MCA territory infarct with core volume of 107 mL. Large mismatch volume of 9.6 with a mismatch ratio of 1.9.   CT head -  Large area of hypoattenuation within the right MCA territory, consistent with subacute infarct. Multifocal hyperdensity within the right MCA territory is consistent with hemorrhage or contrast staining. The more peripheral  hyperdensities are favored to be hemorrhagic. 6 mm leftward midline shift  CT Head 11/02/19 - Worsened edema within the right MCA territory with increased size of right basal ganglia intraparenchymal hematoma. Increased amount of subarachnoid blood over the right hemisphere. 11 mm of leftward midline shift  MRI head - not performed - PPM   CT head 4/20 postop changed from R crani. Evolution large R MCA infarct w/ interval increase edema/swelling. Increased petechial hemorrhage throughout. SAH also increased. R BG HT slightly increased. Mass effect, partial effacement ventricles w/ 2mm L midline shift   CT 4/22 stable large R MCA infarct w/ diffuse petechial hemorrhage   CT Head 11/09/19 - Continued interval evolution of large right MCA territory infarct with associated hemorrhagic transformation, relatively stable as compared to 11/06/2019. Associated mass effect with up to 12 mm of right-to-left shift not significantly changed. Stable asymmetric dilatation of the left lateral ventricle.  CT head - 11/16/19 - Decreased right hemispheric edema and decreased leftward midline shift.  2D Echo - EF 50-55%. No source of embolus. 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   Sars Corona Virus 2 - negative  LDL - 158  HgbA1c - 9.7  UDS - negative  VTE prophylaxis - SCDs  warfarin daily prior to admission, now on ASA 325mg . Will consider resume AC in a week or so.  Therapy recommendations:  CIR - admission coordinator following  Disposition:  Pending  Acute hypoxemia respiratory failure d/t stroke  Intubated for emergent hemicraniectomy for cerebral edema  Now extubated  CXR 4/20 - Complete opacification of the retrocardiac left lung base worrisome for left lower lobe atelectasis.  CXR 4/22 B pulm infiltrates / edema and B pleural effusions  CXR 4/23 - Bilateral pleural effusions, right greater than left, with associated opacity, likely atelectasis.  CXR 11/09/19 - Stable small  right effusion with underlying atelectasis. Stableeft retrocardiac opacity. The small layering left effusion seen previously is less prominent.  CXR 4/27: pleural effusions. Pt tachyonic, but no acute distress  Lasix 40 IV given x1  CXR 11/15/19 - Persistent bilateral pleural effusions, left greater than right. Persistent retrocardiac opacity favored to represent atelectasis.  Chest PT and 3% nebs PRN  Cerebral edema w/ subfalcine herniation s/p decompressive hemicraniectomy  CT 11/16/19 - Decreased right hemispheric edema and decreased leftward midline shift.  3% saline @ 50 cc/hr -> NS @ 50 -> off  Na 138->...-151-150-150->148->148->151->144->139->137  NSG on board s/p [facility] 4/18  PICC line placed  23.4% x 2  Atrial fibrillation w/ RVR  Telemetry showed A. fib, rate controlled  On  Coumadin PTA, family stated compliance  Subtherapeutic INR 1.2 on presentation -> 1.4  No AC at the time due to large infarct and hemorrhagic conversion (previously refused DOACs)  metoprolol 50 bid -> 25 bid  On cardizem 60 q 8h->60 q 6h  amiodarone gtt converted to po 4/22 - 400 bid x 1 week then 400 daily -> now discontinued  EP Cardiology on board  HR stable, on the low side - decrease metoprolol to 25 bid  On aspirin 325, will consider resume AC in 1 week  Hypertension->Hypotension  Home BP meds: Atenolol; Cardizem ; lisinopril  Current BP meds: cardizem, metoprolol 50 bid  Off cleviprex  lisinopril d/c due to low BP  BP improved and stable  On metoprolol 25 bid -> 50 bid -> 25 bid . SBP goal < 160 mm Hg . Long-term BP goal normotensive  Hyperlipidemia  Home Lipid lowering medication: zetia  LDL 158, goal < 70  Now on lipitor 40  Continue statin at discharge  Diabetes, uncontrolled  Home diabetic meds: metformin  HgbA1c 9.7, goal < 7.0  CBG monitoring  Hyperglycemia  on lantus 25 bid, novolog 1u nocturnal TF coverage     Continue SSI 0-9  DM coordinator  consulted  Dysphagia  NPO  Has cortrak, start on nocturnal feeding  Insulin adjusted for nocturnal feeding  Free water  Cleared for D1 nectar diet  Dietitian consult for caloric count  Speech following  Other Stroke Risk Factors  Advanced age  ETOH use, advised to drink no more than 1 alcoholic beverage per day.  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 After hours, contact General Neurology

## 2019-11-17 NOTE — Progress Notes (Signed)
Inpatient Rehabilitation Admissions Coordinator  I met with patient with her daughter, Jeannetta Nap, at bedside. I await updated therapy treatment today and then will begin insurance authorization for a possible CIR admit pending their approval. Patient complaining about the surgical staples.I have alerted Dr. Erlinda Hong.   Danne Baxter, RN, MSN Rehab Admissions Coordinator [phone] 11/17/2019 11:11 AM

## 2019-11-17 NOTE — Progress Notes (Signed)
Occupational Therapy Treatment Patient Details Name: Krystal Little MRN: [id number] DOB: 1956-04-24 Today's Date: 11/17/2019    History of present illness Pt is 64 y.o. female presented to [facility] ED as a code stroke for  Right gaze deviation and left side weakness. 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 Pt s/p R hemicraniectomy with bone flap placed in abdomen on 4/18.  PMH HTN, DM2, chronic a. Fib (coumadin) s/p pace maker.   OT comments  This 64 yo female admitted with above presents to acute OT today with being seen in conjunction with PT so we could focus on sitting balance and self feeding. Pt awake and alert throughout session and she attempted to do everything asked of her. She sat EOB for 30+ minutes working on self feeding, balance, and mobility. She still needs A for all mobility, but is getting better with her rolling and overall sitting balance. She will continue to benefit from acute OT with follow up on CIR.  Follow Up Recommendations  CIR;Supervision/Assistance - 24 hour    Equipment Recommendations  Other (comment)(TBD next venue)       Precautions / Restrictions Precautions Precautions: Fall Precaution Comments: R hemicraniectomy- skull in R abdomen, right shoulder subluxation Restrictions Weight Bearing Restrictions: No       Mobility Bed Mobility Overal bed mobility: Needs Assistance Bed Mobility: Rolling;Sidelying to Sit;Sit to Sidelying Rolling: (Mod A to left, Max A to right) Sidelying to sit: Total assist;+2 for physical assistance     Sit to sidelying: Total assist;+2 for physical assistance       Balance Overall balance assessment: Needs assistance Sitting-balance support: Single extremity supported;Feet supported Sitting balance-Leahy Scale: Poor Sitting balance - Comments: Mod A overall for sitting balance statically and dynamically (for self feeding EOB and working on kicking her right and left leg sitting EOB). Had pt work  on right and left propping on forearm and coming back up to sit. From left made sure humerus was inline due to subluxation--pt able to come from left and right propping back to midline with Mod A.                                   ADL either performed or assessed with clinical judgement   ADL Overall ADL's : Needs assistance/impaired Eating/Feeding: Maximal assistance;Sitting Eating/Feeding Details (indicate cue type and reason): EOB, pt working on self feeding with spoon. Pt able to self feed right side of plate of food with min A in supported sitting, VCs for getting all food off of spoon. Pt did not see other food on left side of plate--needed to shift food more in front of her to see it or hold it midline or to her right for her to see and work on self feeding. Pt's tendency in sitting is to have a flexed neck posture, when asked to look up there is minmal movement of her neck but she does shift her gaze upward.                                         Vision Baseline Vision/History: Wears glasses Wears Glasses: At all times Additional Comments: See self feeding section. Pt's strong tendency is for eyes to be to right .  Cognition Arousal/Alertness: Awake/alert Behavior During Therapy: Flat affect Overall Cognitive Status: Impaired/Different from baseline Area of Impairment: Orientation;Attention;Following commands;Safety/judgement;Problem solving                 Orientation Level: Disoriented to;Situation(knew she had had a stroke but unaware of the craniectomy) Current Attention Level: Selective   Following Commands: Follows one step commands consistently;Follows one step commands with increased time Safety/Judgement: Decreased awareness of safety;Decreased awareness of deficits   Problem Solving: Slow processing;Decreased initiation;Difficulty sequencing;Requires verbal cues;Requires tactile cues General Comments: Pt asking to wipe  her hands before she ate. She told us her dtr's name and her husband's name                   Pertinent Vitals/ Pain       Pain Assessment: Faces Faces Pain Scale: Hurts a little bit Pain Descriptors / Indicators: Headache Pain Intervention(s): Monitored during session         Frequency  Min 2X/week        Progress Toward Goals  OT Goals(current goals can now be found in the care plan section)  Progress towards OT goals: Progressing toward goals     Plan Discharge plan remains appropriate    Co-evaluation    PT/OT/SLP Co-Evaluation/Treatment: Yes Reason for Co-Treatment: Complexity of the patient's impairments (multi-system involvement);Necessary to address cognition/behavior during functional activity;For patient/therapist safety;To address functional/ADL transfers PT goals addressed during session: Mobility/safety with mobility;Balance;Strengthening/ROM OT goals addressed during session: ADL's and self-care;Strengthening/ROM      AM-PAC OT "6 Clicks" Daily Activity     Outcome Measure   Help from another person eating meals?: A Lot Help from another person taking care of personal grooming?: A Lot Help from another person toileting, which includes using toliet, bedpan, or urinal?: Total Help from another person bathing (including washing, rinsing, drying)?: A Lot Help from another person to put on and taking off regular upper body clothing?: Total Help from another person to put on and taking off regular lower body clothing?: Total 6 Click Score: 9    End of Session    OT Visit Diagnosis: Unsteadiness on feet (R26.81);Other abnormalities of gait and mobility (R26.89);Muscle weakness (generalized) (M62.81);Pain;Low vision, both eyes (H54.2);Other symptoms and signs involving cognitive function Pain - Right/Left: Right Pain - part of body: (headache)   Activity Tolerance Patient tolerated treatment well   Patient Left in bed;with call bell/phone within  reach;with bed alarm set   Nurse Communication (Pt needed A to finish eating--NT in to help her before we left)        Time: 1017-5102 OT Time Calculation (min): 43 min  Charges: OT General Charges $OT Visit: 1 Visit OT Treatments $Self Care/Home Management : 23-37 mins  Golden Circle, OTR/L Acute [employer] Pager [phone] Office [phone]      Almon Register 11/17/2019, 1:38 PM

## 2019-11-17 NOTE — Progress Notes (Signed)
11/17/19 1335  PT Visit Information  Last PT Received On 11/17/19  Assistance Needed +2  PT/OT/SLP Co-Evaluation/Treatment Yes  Reason for Co-Treatment To address functional/ADL transfers;Necessary to address cognition/behavior during functional activity;Complexity of the patient's impairments (multi-system involvement)  PT goals addressed during session Mobility/safety with mobility;Balance  History of Present Illness Pt is 64 y.o. female presented to [facility] ED as a code stroke for  Right gaze deviation and left side weakness. 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 Pt s/p R hemicraniectomy with bone flap placed in abdomen on 4/18.  PMH HTN, DM2, chronic a. Fib (coumadin) s/p pace maker.  Subjective Data  Patient Stated Goal none stated  Precautions  Precautions Fall  Precaution Comments R hemicraniectomy- skull in R abdomen, right shoulder subluxation  Restrictions  Weight Bearing Restrictions No  Pain Assessment  Pain Assessment Faces  Faces Pain Scale 2  Pain Descriptors / Indicators Headache  Pain Intervention(s) Limited activity within patient's tolerance;Monitored during session;Repositioned  Cognition  Arousal/Alertness Awake/alert  Behavior During Therapy Flat affect  Overall Cognitive Status Impaired/Different from baseline  Area of Impairment Orientation;Attention;Following commands;Safety/judgement;Problem solving  Orientation Level Disoriented to;Situation (knew she had had a stroke but unaware of the craniectomy)  Current Attention Level Selective  Following Commands Follows one step commands consistently;Follows one step commands with increased time  Safety/Judgement Decreased awareness of safety;Decreased awareness of deficits  Problem Solving Slow processing;Decreased initiation;Difficulty sequencing;Requires verbal cues;Requires tactile cues  General Comments Pt asking to wipe her hands before she ate. She told us her dtr's name and her  husband's name  Bed Mobility  Overal bed mobility Needs Assistance  Bed Mobility Rolling;Sidelying to Sit;Sit to Sidelying  Rolling Mod assist;Max assist (Mod A to left; max A to right)  Sidelying to sit Total assist;+2 for physical assistance  Sit to sidelying Total assist;+2 for physical assistance  General bed mobility comments Was able to assist with rolling A using RUE and RLE. Required mod A to maintain sitting balance.   Modified Rankin (Stroke Patients Only)  Pre-Morbid Rankin Score 0  Modified Rankin 5  Balance  Overall balance assessment Needs assistance  Sitting-balance support Single extremity supported;Feet supported  Sitting balance-Leahy Scale Poor  Sitting balance - Comments Mod A overall for sitting balance statically and dynamically (for self feeding EOB and working on kicking her right and left leg sitting EOB). Had pt work on right and left propping on forearm and coming back up to sit. From left made sure humerus was inline due to subluxation--pt able to come from left and right propping back to midline with Mod A.  General Exercises - Lower Extremity  Long Arc Quad AROM;Left;5 reps;Limitations  Quad Sets AROM;Left;5 reps  Long Arc Quad Limitations partial ROM secondary to weakness  PT - End of Session  Activity Tolerance Patient tolerated treatment well  Patient left in bed;with call bell/phone within reach;with bed alarm set  Nurse Communication Mobility status;Other (comment) (pt had loose bowel movement )   PT - Assessment/Plan  PT Plan Frequency needs to be updated  PT Visit Diagnosis Difficulty in walking, not elsewhere classified (R26.2)  PT Frequency (ACUTE ONLY) Min 4X/week  Recommendations for Other Services Rehab consult  Follow Up Recommendations CIR;Supervision/Assistance - 24 hour  PT equipment Hospital bed;Wheelchair (measurements PT);Wheelchair cushion (measurements PT);Other (comment) (hoyer)  AM-PAC PT "6 Clicks" Mobility Outcome Measure  (Version 2)  Help needed turning from your back to your side while in a flat  bed without using bedrails? 2  Help needed moving from lying on your back to sitting on the side of a flat bed without using bedrails? 1  Help needed moving to and from a bed to a chair (including a wheelchair)? 1  Help needed standing up from a chair using your arms (e.g., wheelchair or bedside chair)? 1  Help needed to walk in hospital room? 1  Help needed climbing 3-5 steps with a railing?  1  6 Click Score 7  Consider Recommendation of Discharge To: CIR/SNF/LTACH  PT Goal Progression  Progress towards PT goals Progressing toward goals  Acute Rehab PT Goals  PT Goal Formulation With family  Time For Goal Achievement 12/01/19  Potential to Achieve Goals Good  PT Time Calculation  PT Start Time (ACUTE ONLY) 1210  PT Stop Time (ACUTE ONLY) 1253  PT Time Calculation (min) (ACUTE ONLY) 43 min  PT General Charges  $$ ACUTE PT VISIT 1 Visit  PT Treatments  $Neuromuscular Re-education 8-22 mins   Pt progressing towards goals. Pt awake and alert throughout session and she attempted to do everything asked of her. She sat EOB for 30+ minutes working on self feeding, balance, and mobility. Required mod A to sit at EOB and required assist for neck extension. Did not activation of L quadricep and was able to perform partial ROM of LAQ in sitting. Noted active movement at big toe on the L as well. Feel pt would benefit from CIR level therapies to increase independence and safety with mobility. Will continue to follow acutely to maximize functional mobility independence and safety.   Reuel Derby, PT, DPT  Acute Rehabilitation Services  Pager: [phone] Office: [phone]

## 2019-11-18 DIAGNOSIS — E876 Hypokalemia: Secondary | ICD-10-CM | POA: Diagnosis not present

## 2019-11-18 DIAGNOSIS — I63411 Cerebral infarction due to embolism of right middle cerebral artery: Secondary | ICD-10-CM | POA: Diagnosis not present

## 2019-11-18 DIAGNOSIS — E1165 Type 2 diabetes mellitus with hyperglycemia: Secondary | ICD-10-CM | POA: Diagnosis not present

## 2019-11-18 DIAGNOSIS — I482 Chronic atrial fibrillation, unspecified: Secondary | ICD-10-CM | POA: Diagnosis not present

## 2019-11-18 LAB — GLUCOSE, CAPILLARY
Glucose-Capillary: 145 mg/dL — ABNORMAL HIGH (ref 70–99)
Glucose-Capillary: 147 mg/dL — ABNORMAL HIGH (ref 70–99)
Glucose-Capillary: 189 mg/dL — ABNORMAL HIGH (ref 70–99)
Glucose-Capillary: 191 mg/dL — ABNORMAL HIGH (ref 70–99)
Glucose-Capillary: 241 mg/dL — ABNORMAL HIGH (ref 70–99)
Glucose-Capillary: 85 mg/dL (ref 70–99)

## 2019-11-18 LAB — BASIC METABOLIC PANEL
Anion gap: 9 (ref 5–15)
BUN: 8 mg/dL (ref 8–23)
CO2: 27 mmol/L (ref 22–32)
Calcium: 8.2 mg/dL — ABNORMAL LOW (ref 8.9–10.3)
Chloride: 101 mmol/L (ref 98–111)
Creatinine, Ser: 0.56 mg/dL (ref 0.44–1.00)
GFR calc Af Amer: >60 mL/min (ref >60)
GFR calc non Af Amer: >60 mL/min (ref >60)
Glucose, Bld: 191 mg/dL — ABNORMAL HIGH (ref 70–99)
Potassium: 3.8 mmol/L (ref 3.5–5.1)
Sodium: 137 mmol/L (ref 135–145)

## 2019-11-18 LAB — CBC
HCT: 30.3 % — ABNORMAL LOW (ref 36.0–46.0)
Hemoglobin: 9.7 g/dL — ABNORMAL LOW (ref 12.0–15.0)
MCH: 31.1 pg (ref 26.0–34.0)
MCHC: 32 g/dL (ref 30.0–36.0)
MCV: 97.1 fL (ref 80.0–100.0)
Platelets: 378 10*3/uL (ref 150–400)
RBC: 3.12 MIL/uL — ABNORMAL LOW (ref 3.87–5.11)
RDW: 15.9 % — ABNORMAL HIGH (ref 11.5–15.5)
WBC: 8.4 10*3/uL (ref 4.0–10.5)
nRBC: 0 % (ref 0.0–0.2)

## 2019-11-18 NOTE — Progress Notes (Signed)
Physical Therapy Treatment Patient Details Name: Krystal Little MRN: [id number] DOB: 12/16/1955 Today's Date: 11/18/2019    History of Present Illness Pt is 64 y.o. female presented to [facility] ED as a code stroke for  Right gaze deviation and left side weakness. 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 Pt s/p R hemicraniectomy with bone flap placed in abdomen on 4/18.  PMH HTN, DM2, chronic a. Fib (coumadin) s/p pace maker.    PT Comments    Patient is making progress toward PT goals and able to stand X 2 trials with +2 assist and Stedy standing frame. Pt continues to require mod A for sitting balance EOB and assist for cervical extension. Continue to recommend CIR for further skilled PT services to maximize independence with mobility and decreased caregiver burden.     Follow Up Recommendations  CIR;Supervision/Assistance - 24 hour     Equipment Recommendations  Hospital bed;Wheelchair (measurements PT);Wheelchair cushion (measurements PT);Other (comment)(hoyer)    Recommendations for Other Services Rehab consult     Precautions / Restrictions Precautions Precautions: Fall Precaution Comments: R hemicraniectomy- skull in R abdomen, L shoulder subluxation Restrictions Weight Bearing Restrictions: No    Mobility  Bed Mobility Overal bed mobility: Needs Assistance Bed Mobility: Sit to Sidelying;Supine to Sit     Supine to sit: Mod assist;+2 for safety/equipment   Sit to sidelying: Total assist;+2 for physical assistance General bed mobility comments: pt able to bring R LE to EOB and assisting with R UE; assist to bring hips to EOB and to elevate trunk into sitting   Transfers Overall transfer level: Needs assistance   Transfers: Sit to/from Stand Sit to Stand: Mod assist;+2 physical assistance;From elevated surface         General transfer comment: pt able to stand X 2 using stedy standing frame; assist to power up with L UE supported and to  maintain balance in standing   Ambulation/Gait                 Stairs             Wheelchair Mobility    Modified Rankin (Stroke Patients Only) Modified Rankin (Stroke Patients Only) Pre-Morbid Rankin Score: No symptoms Modified Rankin: Severe disability     Balance Overall balance assessment: Needs assistance Sitting-balance support: Single extremity supported;Feet supported Sitting balance-Leahy Scale: Poor Sitting balance - Comments: pt able to maintain balance with mod A; assist for cervical extension    Standing balance support: Bilateral upper extremity supported;During functional activity Standing balance-Leahy Scale: Poor                              Cognition Arousal/Alertness: Awake/alert Behavior During Therapy: Flat affect Overall Cognitive Status: Impaired/Different from baseline Area of Impairment: Orientation;Attention;Following commands;Safety/judgement;Problem solving                 Orientation Level: Disoriented to;Situation Current Attention Level: Sustained   Following Commands: Follows one step commands consistently;Follows one step commands with increased time Safety/Judgement: Decreased awareness of safety;Decreased awareness of deficits   Problem Solving: Slow processing;Decreased initiation;Difficulty sequencing;Requires verbal cues;Requires tactile cues General Comments: cues to open eyes      Exercises      General Comments        Pertinent Vitals/Pain Pain Assessment: Faces Faces Pain Scale: Hurts a little bit Pain Descriptors / Indicators: Headache    Home Living  Prior Function            PT Goals (current goals can now be found in the care plan section) Acute Rehab PT Goals Patient Stated Goal: none stated Progress towards PT goals: Progressing toward goals    Frequency    Min 4X/week      PT Plan Current plan remains appropriate    Co-evaluation               AM-PAC PT "6 Clicks" Mobility   Outcome Measure  Help needed turning from your back to your side while in a flat bed without using bedrails?: A Lot Help needed moving from lying on your back to sitting on the side of a flat bed without using bedrails?: A Lot Help needed moving to and from a bed to a chair (including a wheelchair)?: A Lot Help needed standing up from a chair using your arms (e.g., wheelchair or bedside chair)?: A Lot Help needed to walk in hospital room?: Total Help needed climbing 3-5 steps with a railing? : Total 6 Click Score: 10    End of Session   Activity Tolerance: Patient tolerated treatment well Patient left: in bed;with call bell/phone within reach;with bed alarm set Nurse Communication: Mobility status PT Visit Diagnosis: Difficulty in walking, not elsewhere classified (R26.2)     Time: 1517-6160 PT Time Calculation (min) (ACUTE ONLY): 31 min  Charges:  $Therapeutic Activity: 23-37 mins                     Earney Navy, PTA Acute Rehabilitation Services Pager: [phone] Office: [phone]     Darliss Cheney 11/18/2019, 5:05 PM

## 2019-11-18 NOTE — Progress Notes (Signed)
STROKE TEAM PROGRESS NOTE   INTERVAL HISTORY No family at bedside. Pt still lethargic but open eyes on voice, orientated to place and year, but said "April" instead of May. She still has nocturnal tube feeding and glucose much improved. However, po intake still not ideal, on calore count. Pending insurance approval for CIR.    OBJECTIVE Vitals:   11/17/19 2357 11/18/19 0315 11/18/19 0858 11/18/19 1125  BP: 120/80 (!) 145/76 116/60 133/79  Pulse: 60 62 89 62  Resp:  20 18 20   Temp: 98.1 F (36.7 C) 98 F (36.7 C) 98.9 F (37.2 C) 98.4 F (36.9 C)  TempSrc: Oral Oral Oral Oral  SpO2: 98% 98% 96% 96%  Weight:      Height:        CBC:  Recent Labs  Lab 11/17/19 0500 11/18/19 0500  WBC 9.5 8.4  HGB 9.4* 9.7*  HCT 29.5* 30.3*  MCV 97.0 97.1  PLT 335 875   Basic Metabolic Panel:  Recent Labs  Lab 11/12/19 0445 11/15/19 0520 11/17/19 0500 11/18/19 0500  NA 151*   < > 137 137  K 3.6   < > 3.5 3.8  CL 111   < > 104 101  CO2 31   < > 27 27  GLUCOSE 135*   < > 175* 191*  BUN 28*   < > 17 8  CREATININE 0.89   < > 0.65 0.56  CALCIUM 8.4*   < > 7.9* 8.2*  MG 2.5*  --   --   --   PHOS 4.0  --   --   --    < > = values in this interval not displayed.   IMAGING past 24h   No results found.   PHYSICAL EXAM   Temp:  [98 F (36.7 C)-98.9 F (37.2 C)] 98.4 F (36.9 C) (05/04 1125) Pulse Rate:  [60-89] 62 (05/04 1125) Resp:  [18-20] 20 (05/04 1125) BP: (116-145)/(54-80) 133/79 (05/04 1125) SpO2:  [96 %-99 %] 96 % (05/04 1125)  General - Well nourished, well developed, lethargic but eyes open on voice.  Ophthalmologic - fundi not visualized due to noncooperation.  Cardiovascular - irregularly irregular heart rate and rhythm.  Neuro - lethargic with eyes open on voice, following simple commands on the right, able to say short sentences and answer questions with brief answer, able to repeat and name, but moderate dysarthria. Left lower quadrantanopia. Right gaze  preference, barely cross midline. Left facial droop, left UE 0/5 flaccid LLE slight withdraw to pain. RUE proximal 3/5 and distal 4/5 and RLE 3/5 proximal and 4/5 distally. Left side diminished muscle tone. Negative babinski. Sensation, coordination not cooperative and gait not tested.   ASSESSMENT/PLAN Krystal Little is a 64 y.o. female with history of HTN, DM2, hx of mitral valve repair, chronic a. Fib (coumadin - INR 1.2 on admission) s/p pacemaker who presented to [facility] ED as a code stroke for right gaze deviation, urinary incontinence and left sided weakness. She did not receive IV t-PA due to anticoagulation with coumadin. IR -> ICA terminus occlusion. Mechanical thrombectomy performed with 2 stent retriever + aspiration passes in the MCA-ICA (solitaire and embotrap), 1 stent retriever + aspiration pass MCA (embotrap) and 1 direct contact aspiration in the right ACA. Complete recanalization achieved (TICI3).  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 Cannot exclude etiology as embolic secondary to atrial fibrillation on warfarin with subtherapeutic INR  CT Head -  Large right MCA territory nonhemorrhagic infarct is described. Hyperdense right MCA suggesting extensive thrombus from the right ICA terminus through the MCA bifurcation Some mass effect with partial effacement the sulci on the right. No midline shift. ASPECTS is 4/10   CTA H&N - Total occlusion of right ICA terminus. Thrombus extends through the right MCA bifurcation with poor collaterals.   CT Perfusion - Confirmed large right MCA territory infarct with core volume of 107 mL. Large mismatch volume of 9.6 with a mismatch ratio of 1.9.   CT head -  Large area of hypoattenuation within the right MCA territory, consistent with subacute infarct. Multifocal hyperdensity within the right MCA territory is consistent with hemorrhage or contrast staining. The more peripheral  hyperdensities are favored to be hemorrhagic. 6 mm leftward midline shift  CT Head 11/02/19 - Worsened edema within the right MCA territory with increased size of right basal ganglia intraparenchymal hematoma. Increased amount of subarachnoid blood over the right hemisphere. 11 mm of leftward midline shift  MRI head - not performed - PPM   CT head 4/20 postop changed from R crani. Evolution large R MCA infarct w/ interval increase edema/swelling. Increased petechial hemorrhage throughout. SAH also increased. R BG HT slightly increased. Mass effect, partial effacement ventricles w/ 53mm L midline shift   CT 4/22 stable large R MCA infarct w/ diffuse petechial hemorrhage   CT Head 11/09/19 - Continued interval evolution of large right MCA territory infarct with associated hemorrhagic transformation, relatively stable as compared to 11/06/2019. Associated mass effect with up to 12 mm of right-to-left shift not significantly changed. Stable asymmetric dilatation of the left lateral ventricle.  CT head - 11/16/19 - Decreased right hemispheric edema and decreased leftward midline shift.  2D Echo - EF 50-55%. No source of embolus. 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   Sars Corona Virus 2 - negative  LDL - 158  HgbA1c - 9.7  UDS - negative  VTE prophylaxis - SCDs  warfarin daily prior to admission, now on ASA 325mg . Will consider resume AC around 5/7 after CT repeat.  Therapy recommendations:  CIR - admission coordinator following - beginning insurance approval process for CIR  Disposition:  Pending  Acute hypoxemia respiratory failure d/t stroke  Intubated for emergent hemicraniectomy for cerebral edema  Now extubated  CXR 4/20 - Complete opacification of the retrocardiac left lung base worrisome for left lower lobe atelectasis.  CXR 4/22 B pulm infiltrates / edema and B pleural effusions  CXR 4/23 - Bilateral pleural effusions, right greater than left, with associated  opacity, likely atelectasis.  CXR 11/09/19 - Stable small right effusion with underlying atelectasis. Stableeft retrocardiac opacity. The small layering left effusion seen previously is less prominent.  CXR 4/27: pleural effusions. Pt tachyonic, but no acute distress  Lasix 40 IV given x1  CXR 11/15/19 - Persistent bilateral pleural effusions, left greater than right. Persistent retrocardiac opacity favored to represent atelectasis.  Chest PT and 3% saline nebs PRN  Cerebral edema w/ subfalcine herniation s/p decompressive hemicraniectomy  CT 11/16/19 - Decreased right hemispheric edema and decreased leftward midline shift.  3% saline @ 50 cc/hr -> NS @ 50 -> off  Na 138->...-151-150-150->148->148->151->144->139->137->137  NSG on board s/p [facility] 4/18  PICC line placed  23.4% x 2  Atrial fibrillation w/ RVR  Telemetry showed A. fib, rate controlled  On Coumadin PTA, family stated compliance  Subtherapeutic INR 1.2 on presentation -> 1.4  No AC at the time due  to large infarct and hemorrhagic conversion (previously refused DOACs)  metoprolol 50 bid -> 25 bid  On cardizem 60 q 8h->60 q 6h  amiodarone gtt converted to po 4/22 - 400 bid x 1 week then 400 daily -> now discontinued  EP Cardiology on board  HR stable, on the low side - decrease metoprolol to 25 bid  On aspirin 325, will consider resume AC around 5/7  Hypertension->Hypotension  Home BP meds: Atenolol; Cardizem ; lisinopril  Current BP meds: cardizem, metoprolol 50 bid  Off cleviprex  lisinopril d/c due to low BP  BP improved and stable  On metoprolol 25 bid -> 50 bid -> 25 bid . SBP goal < 160 mm Hg . Long-term BP goal normotensive  Hyperlipidemia  Home Lipid lowering medication: zetia  LDL 158, goal < 70  Now on lipitor 40  Continue statin at discharge  Diabetes, uncontrolled  Home diabetic meds: metformin  HgbA1c 9.7, goal < 7.0  CBG monitoring  Hyperglycemia  on lantus 25 bid,  novolog 10u nocturnal TF coverage     Continue SSI 0-9  DM coordinator consulted  Dysphagia  Has cortrak, start on nocturnal feeding  Insulin adjusted for nocturnal feeding  Cleared for D1 nectar diet  Dietitian consult for caloric count, ok oral intake thus far - RD to follow up tomorrow for final readhing  Speech following  Other Stroke Risk Factors  Advanced age  ETOH use, advised to drink no more than 1 alcoholic beverage per day.  Other Active Problems  Code status - Full code  SSS with pacemaker, not compatible with MRI  Hypokalemia - 3.6 ->resolved ->3.5->3.5->3.8  Leukocytosis WBC 12.7->12.6->11.9->11.5->9.5->8.4  Add amandtadine      Anemia - Hb - 10.3->10.0->9.8->9.5->9.4->9.7  Hospital day # 18  Rosalin Hawking, MD PhD Stroke Neurology 11/18/2019 1:54 PM  To contact Stroke Continuity provider, please refer to [email or web address] After hours, contact General Neurology

## 2019-11-18 NOTE — Progress Notes (Addendum)
Calorie Count Note  72 hour calorie count ordered.  Diet: Dysphagia 1 with Nectar Thick Liquids Supplements: Magic Cup TID    Day 1 Results (5/2) Breakfast: 328 kcals, 11 grams protein Lunch: 338 kcals, 14 grams protein Dinner: 196 kcals, 7 grams protein Supplements: 3 Glucerna shakes (750 kcals, 27 grams protein)  Total intake: 1612 kcal (100% of minimum estimated needs)  59 grams protein (78% of minimum estimated needs)  Day 2 Results (5/3) Breakfast: 191 kcals, 11 grams protein Lunch:  563 kcals, 28 grams protein Dinner: 688 kcals, 30 grams protein Supplements: Magic Cup TID (included in meal completion information)  Total intake: 1442 kcal (96% of minimum estimated needs)  69 grams protein (90% of minimum estimated needs)   Today, she ate 512 kcals and 10 grams of protein for breakfast. RD will follow-up tomorrow with the remaining results.   Nutrition Dx: Inadequate oral intake related to inability to eat as evidenced by NPO status.  Progressing; advanced to dysphagia 1 diet with nectar thick liquids, transitioned to nocturnal TF   Goal: Patient will meet greater than or equal to 90% of their needs  Progressing   Intervention:  -Continue feeding assistance with meals -Continue Magic cup TID with meals, each supplement provides 290 kcal and 9 grams of protein -Continue nocturnal TF:  Glucerna 1.5 @ 50 ml/hr via cortrak tube over 10 hour period (2000-0600)  150 ml free water flush every 4 hours  Tube feeding regimen provides 1750 kcal (50% of needs), 41 grams of protein, and 380 ml of H2O. Total free water: 1280 ml daily  Larkin Ina, MS, RD, LDN RD pager number and weekend/on-call pager number located in [place].

## 2019-11-19 ENCOUNTER — Ambulatory Visit: Payer: Medicare Other

## 2019-11-19 DIAGNOSIS — E1165 Type 2 diabetes mellitus with hyperglycemia: Secondary | ICD-10-CM | POA: Diagnosis not present

## 2019-11-19 DIAGNOSIS — I482 Chronic atrial fibrillation, unspecified: Secondary | ICD-10-CM | POA: Diagnosis not present

## 2019-11-19 DIAGNOSIS — E876 Hypokalemia: Secondary | ICD-10-CM | POA: Diagnosis not present

## 2019-11-19 DIAGNOSIS — I63411 Cerebral infarction due to embolism of right middle cerebral artery: Secondary | ICD-10-CM | POA: Diagnosis not present

## 2019-11-19 LAB — CBC
HCT: 30.7 % — ABNORMAL LOW (ref 36.0–46.0)
Hemoglobin: 9.7 g/dL — ABNORMAL LOW (ref 12.0–15.0)
MCH: 30.9 pg (ref 26.0–34.0)
MCHC: 31.6 g/dL (ref 30.0–36.0)
MCV: 97.8 fL (ref 80.0–100.0)
Platelets: 390 10*3/uL (ref 150–400)
RBC: 3.14 MIL/uL — ABNORMAL LOW (ref 3.87–5.11)
RDW: 16.2 % — ABNORMAL HIGH (ref 11.5–15.5)
WBC: 9.1 10*3/uL (ref 4.0–10.5)
nRBC: 0 % (ref 0.0–0.2)

## 2019-11-19 LAB — GLUCOSE, CAPILLARY
Glucose-Capillary: 132 mg/dL — ABNORMAL HIGH (ref 70–99)
Glucose-Capillary: 121 mg/dL — ABNORMAL HIGH (ref 70–99)
Glucose-Capillary: 129 mg/dL — ABNORMAL HIGH (ref 70–99)
Glucose-Capillary: 219 mg/dL — ABNORMAL HIGH (ref 70–99)
Glucose-Capillary: 261 mg/dL — ABNORMAL HIGH (ref 70–99)

## 2019-11-19 LAB — BASIC METABOLIC PANEL
Anion gap: 11 (ref 5–15)
BUN: 9 mg/dL (ref 8–23)
CO2: 26 mmol/L (ref 22–32)
Calcium: 8.5 mg/dL — ABNORMAL LOW (ref 8.9–10.3)
Chloride: 99 mmol/L (ref 98–111)
Creatinine, Ser: 0.65 mg/dL (ref 0.44–1.00)
GFR calc Af Amer: >60 mL/min (ref >60)
GFR calc non Af Amer: >60 mL/min (ref >60)
Glucose, Bld: 150 mg/dL — ABNORMAL HIGH (ref 70–99)
Potassium: 4 mmol/L (ref 3.5–5.1)
Sodium: 136 mmol/L (ref 135–145)

## 2019-11-19 MED ORDER — PRO-STAT SUGAR FREE PO LIQD
30.0000 mL | Freq: Two times a day (BID) | ORAL | Status: DC
  Administered 2019-11-19 – 2019-11-20 (×3): 30 mL via ORAL
  Filled 2019-11-19 (×3): qty 30

## 2019-11-19 MED ORDER — INSULIN GLARGINE 100 UNIT/ML ~~LOC~~ SOLN
20.0000 [IU] | Freq: Two times a day (BID) | SUBCUTANEOUS | Status: DC
  Administered 2019-11-19 – 2019-11-20 (×2): 20 [IU] via SUBCUTANEOUS
  Filled 2019-11-19 (×3): qty 0.2

## 2019-11-19 MED ORDER — INSULIN ASPART 100 UNIT/ML ~~LOC~~ SOLN
8.0000 [IU] | Freq: Two times a day (BID) | SUBCUTANEOUS | Status: DC
  Administered 2019-11-19 – 2019-11-20 (×2): 8 [IU] via SUBCUTANEOUS

## 2019-11-19 NOTE — Progress Notes (Signed)
Inpatient Rehabilitation Admissions Coordinator  I have insurance approval for CIR but no bed available to admit her to today. I met with patient at bedside with daughter and spouse. They are aware. I will follow up tomorrow.  Danne Baxter, RN, MSN Rehab Admissions Coordinator [phone] 11/19/2019 11:13 AM

## 2019-11-19 NOTE — PMR Pre-admission (Signed)
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 Date: 07/18/2019     Deduct: none      Out of Pocket Max: $3600      Life Max: none  CIR: $295 co pay per day days 1 until 5      SNF: no copay days 1 until 20; $184 co pay per day days 21 until 40; no cap[y days 41 until 100 Outpatient: $30 per visit     Co-Pay: visits per medical neccesity Home Health: 100%      Co-Pay: visits per medical necessity DME: 80%     Co-Pay: 20% Providers: in network  SECONDARY: none  Financial Counselor:       Phone#:   [employer]" for patients in Inpatient Rehabilitation Facilities with attached "Privacy Act [place] Records" was provided and verbally reviewed with: Family  Emergency Contact Information Contact Information    Name Relation Home Work [place] [phone]  [phone]   Yalexa, Blust Daughter [phone]     Mariea Clonts   [phone]     Current Medical History  Patient Admitting Diagnosis: right MCA infarct and right hemicraniectomy  History of Present Illness: 64 year old right-handed female with history of hypertension, diabetes mellitus, chronic atrial fibrillation on Coumadin therapy as well as pacemaker and mitral valve repair.   Presented 10/31/2019 left-sided weakness neglect and right gaze deviation.  Admission chemistries with hemoglobin 14.3, glucose 214, INR 1.2, urine drug screen negative.  Cranial CT scan as well as CT angiogram head and neck  showed a large right MCA territory nonhemorrhagic infarction.  Hyperdense right MCA suggesting extensive thrombus from the right ICA terminus to the MCA bifurcation.  Echocardiogram with ejection fraction of 55%.  Patient underwent endovascular thrombectomy revascularization per interventional radiology.  Follow-up imaging showed worsening edema involving the right middle cerebral artery causing significant radiologic mass-effect and patient underwent decompressive right hemicraniectomy placement of bone flap into abdominal subcutaneous pocket 11/02/2019 per Dr. Kathyrn Sheriff.  Patient was cleared to begin subcutaneous Lovenox for DVT prophylaxis 11/09/2019.  CT scan follow-up 11/16/2019 decreased right hemispheric edema and decreased left midline shift.  Initially maintained on Cleviprex for blood pressure control.  Follow cardiology service for atrial fibrillation maintained on Cardizem as well as Lopressor and her chronic Coumadin has been discontinued at this present time with aspirin 325 mg initiated 11/17/2019 and consideration is being made to resuming anticoagulation 11/21/2019 based on CT results..  Acute blood loss anemia 9.7 and monitored.  Currently maintained on a dysphagia #1 nectar thick liquid diet with calorie counts and nasogastric tube have been in place for nutritional support for nocturnal supplementation.   Complete NIHSS TOTAL: 8 Glasgow Coma Scale Score: 15  Past Medical History  Past Medical History:  Diagnosis Date  . Chronic atrial fibrillation ([place]) 1990s   s/p DCCV then attempted ablation of complex A Flutter (@ [facility] - Dr. Deno Etienne),  failed antiarrhythmics --> INR folllwed @ [facility] ->> now status post pacemaker placement with underlying A. fib.  . Diabetes mellitus type 2, uncontrolled, without complications    On Oral Medications ([facility])  . Essential hypertension   . History of cardiac catheterization 2001   R&LHC - normal Coronaries, no evidence of Restictive Cardiomyopathy or  Constrictive Pericarditis (also @ [facility])  . Hx of sick sinus syndrome 07/1999   Wtih symptomatic bradycardia - syncope (Tachy-Brady)  . 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  Prior Rehab/Hospitalizations:  Has the patient had prior rehab or hospitalizations prior to admission? Yes  Has the patient had major surgery during 100 days prior to admission? Yes  Current Medications   Current Facility-Administered Medications:  .  0.9 %  sodium chloride infusion, , Intravenous, Continuous, Donzetta Starch, NP, Stopped at 11/18/19 2252 .  acetaminophen (TYLENOL) tablet 650 mg, 650 mg, Oral, Q4H PRN **OR** acetaminophen (TYLENOL) 160 MG/5ML solution 650 mg, 650 mg, Per Tube, Q4H PRN, 650 mg at 11/17/19 1937 **OR** acetaminophen (TYLENOL) suppository 650 mg, 650 mg, Rectal, Q4H PRN, Donzetta Starch, NP, 650 mg at 11/01/19 0921 .  amantadine (SYMMETREL) 50 MG/5ML solution 100 mg, 100 mg, Per Tube, BID, Biby, Sharon L, NP, 100 mg at 11/20/19 0943 .  aspirin tablet 325 mg, 325 mg, Per Tube, Daily, Blenda Nicely, RPH, 325 mg at 11/20/19 8657 .  atorvastatin (LIPITOR) tablet 40 mg, 40 mg, Per Tube, Daily, Blenda Nicely, RPH, 40 mg at 11/20/19 0944 .  chlorhexidine gluconate (MEDLINE KIT) (PERIDEX) 0.12 % solution 15 mL, 15 mL, Mouth Rinse, BID, Biby, Sharon L, NP, 15 mL at 11/20/19 0942 .  Chlorhexidine Gluconate Cloth 2 % PADS 6 each, 6 each, Topical, Q0600, Donzetta Starch, NP, 6 each at 11/20/19  0550 .  diltiazem (CARDIZEM) 10 mg/ml oral suspension 60 mg, 60 mg, Per Tube, Q6H, Biby, Sharon L, NP, 60 mg at 11/20/19 0549 .  enoxaparin (LOVENOX) injection 40 mg, 40 mg, Subcutaneous, Q24H, Biby, Sharon L, NP, 40 mg at 11/19/19 1311 .  feeding supplement (GLUCERNA 1.5 CAL) liquid 500 mL, 500 mL, Per Tube, Q24H, Rosalin Hawking, MD, 500 mL at 11/19/19 2057 .  feeding supplement (PRO-STAT SUGAR FREE 64) liquid 30 mL, 30 mL, Oral, BID, Rosalin Hawking, MD, 30 mL at 11/20/19 0942 .  fentaNYL (SUBLIMAZE) injection 25-100 mcg, 25-100 mcg, Intravenous, Q2H PRN, Biby, Sharon L, NP, 25 mcg at 11/14/19 0230 .  insulin aspart (novoLOG) injection 0-9 Units, 0-9 Units, Subcutaneous, TID WC, Rosalin Hawking, MD, 7 Units at 11/19/19 1648 .  insulin aspart (novoLOG) injection 8 Units, 8 Units, Subcutaneous, BID, Rosalin Hawking, MD, 8 Units at 11/20/19 0028 .  insulin glargine (LANTUS) injection 20 Units, 20 Units, Subcutaneous, BID, Rosalin Hawking, MD, 20 Units at 11/20/19 [phone] .  MEDLINE mouth rinse, 15 mL, Mouth Rinse, 10 times per day, Burnetta Sabin L, NP, 15 mL at 11/20/19 0944 .  metoprolol tartrate (LOPRESSOR)  injection 2.5 mg, 2.5 mg, Intravenous, Q6H PRN, Burnetta Sabin L, NP, 2.5 mg at 11/07/19 0640 .  metoprolol tartrate (LOPRESSOR) tablet 25 mg, 25 mg, Per Tube, BID, Rosalin Hawking, MD, 25 mg at 11/20/19 0943 .  multivitamin with minerals tablet 1 tablet, 1 tablet, Oral, Daily, Rosalin Hawking, MD, 1 tablet at 11/20/19 0943 .  ondansetron (ZOFRAN) injection 4 mg, 4 mg, Intravenous, Q6H PRN, Burnetta Sabin L, NP, 4 mg at 11/01/19 0640 .  pantoprazole sodium (PROTONIX) 40 mg/20 mL oral suspension 40 mg, 40 mg, Per Tube, Daily, Biby, Sharon L, NP, 40 mg at 11/20/19 0944 .  polyethylene glycol (MIRALAX / GLYCOLAX) packet 17 g, 17 g, Per Tube, BID, Biby, Sharon L, NP, 17 g at 11/20/19 0942 .  [employer], , Oral, PRN, Rosalin Hawking, MD .  senna-docusate (Senokot-S) tablet 1 tablet, 1 tablet, Per Tube, QHS PRN, Miachel Roux, Sharon  L, NP .  senna-docusate (Senokot-S) tablet 1 tablet, 1 tablet, Per Tube, BID, Donzetta Starch, NP, 1 tablet at 11/20/19 0943 .  sodium chloride flush (NS) 0.9 % injection 10-40 mL, 10-40 mL, Intracatheter, Q12H, Biby, Sharon L, NP, 10 mL at 11/20/19 0944 .  sodium chloride flush (NS) 0.9 % injection 10-40 mL, 10-40 mL, Intracatheter, PRN, Biby, Sharon L, NP .  sodium chloride HYPERTONIC 3 % nebulizer solution 4 mL, 4 mL, Nebulization, PRN, Rosalin Hawking, MD  Patients Current Diet:  Diet Order            DIET - DYS 1 Room service appropriate? Yes; Fluid consistency: Nectar Thick  Diet effective now              Precautions / Restrictions Precautions Precautions: Fall Precaution Comments: R hemicraniectomy- skull in R abdomen, L shoulder subluxation Restrictions Weight Bearing Restrictions: No   Has the patient had 2 or more falls or a fall with injury in the past year?No  Prior Activity Level Community (5-7x/wk): Independent, driving and working  Prior Functional Level Prior Function Level of Independence: Independent Comments: ADLs, IADLs, driving, and enjoys playing basketball (favorite team [employer] basketball and likes [employer] football). Working at [employer] in Therapist, art.  Self Care: Did the patient need help bathing, dressing, using the toilet or eating?  Independent  Indoor Mobility: Did the patient need assistance with walking from room to room (with or without device)? Independent  Stairs: Did the patient need assistance with internal or external stairs (with or without device)? Independent  Functional Cognition: Did the patient need help planning regular tasks such as shopping or remembering to take medications? Independent  Home Assistive Devices / Equipment Home Assistive Devices/Equipment: None Home Equipment: None  Prior Device Use: Indicate devices/aids used by the patient prior to current illness, exacerbation or injury? None of the above  Current  Functional Level Cognition  Overall Cognitive Status: Impaired/Different from baseline Difficult to assess due to: Impaired communication Current Attention Level: Sustained Orientation Level: Oriented X4 Following Commands: Follows one step commands consistently, Follows one step commands with increased time Safety/Judgement: Decreased awareness of safety, Decreased awareness of deficits General Comments: keeps eyes closed most of the time; soft voice and slow to respond but appropriate and oriented      Extremity Assessment (includes Sensation/Coordination)  Upper Extremity Assessment: LUE deficits/detail, RUE deficits/detail RUE Deficits / Details: thumbs up and down, reaching for therapist in midline.  RUE Coordination: decreased fine motor, decreased gross motor LUE Deficits / Details: no activation LUE Coordination: decreased fine motor, decreased gross  motor  Lower Extremity Assessment: Defer to PT evaluation    ADLs  Overall ADL's : Needs assistance/impaired Eating/Feeding: Maximal assistance, Sitting Eating/Feeding Details (indicate cue type and reason): EOB, pt working on self feeding with spoon. Pt able to self feed right side of plate of food with min A in supported sitting, VCs for getting all food off of spoon. Pt did not see other food on left side of plate--needed to shift food more in front of her to see it or hold it midline or to her right for her to see and work on self feeding. Pt's tendency in sitting is to have a flexed neck posture, when asked to look up there is minmal movement of her neck but she does shift her gaze upward. Grooming: Wash/dry face, Maximal assistance Grooming Details (indicate cue type and reason): told to wash face and washing only R eye, with tactile cues able to wash forehead minimally on L side Toileting- Clothing Manipulation and Hygiene: Total assistance, +2 for physical assistance, +2 for safety/equipment, Bed level Toileting - Clothing  Manipulation Details (indicate cue type and reason): rolling and peri care General ADL Comments: Pt requires max cues to keep eyes open but more more receptive to commands to attempt to weight shift at trunk in each plane, pt able to sit EOB for < 15 minutes, pt with increased tightness in neck, ranging attempted, but pt would gaurd with movement    Mobility  Overal bed mobility: Needs Assistance Bed Mobility: Rolling, Sidelying to Sit Rolling: Min assist, Max assist Sidelying to sit: Max assist, +2 for safety/equipment Supine to sit: Mod assist, +2 for safety/equipment Sit to supine: Max assist, +2 for physical assistance, +2 for safety/equipment Sit to sidelying: Total assist, +2 for physical assistance General bed mobility comments: cues for sequencing; pt able to roll to L side with min A and max A to roll to R side; pt using rail with R UE; assist to bring bilat LE to EOB and to elevate trunk into sitting     Transfers  Overall transfer level: Needs assistance Equipment used: 2 person hand held assist(with bed pad under bottom) Transfer via Lift Equipment: Stedy Transfers: Sit to/from Stand Sit to Stand: Mod assist, +2 physical assistance, From elevated surface General transfer comment: pt able to stand X 2 using stedy standing frame; assist to power up with L UE supported and to maintain balance in standing     Ambulation / Gait / Stairs / Wheelchair Mobility  Ambulation/Gait General Gait Details: unable at this time    Posture / Balance Dynamic Sitting Balance Sitting balance - Comments: pt able to maintain balance with mod A; assist for cervical extension  Balance Overall balance assessment: Needs assistance Sitting-balance support: Single extremity supported, Feet supported Sitting balance-Leahy Scale: Poor Sitting balance - Comments: pt able to maintain balance with mod A; assist for cervical extension  Postural control: Posterior lean Standing balance support: Bilateral  upper extremity supported, During functional activity Standing balance-Leahy Scale: Poor Standing balance comment: dependent on physical assist    Special needs/care consideration  Skin surgical incision with staples; right abdomen with bone flap; right distal leg with venous stasis ulcer 2 cm x 2 cm x0 cm Cortrak placed 4/30 43 inches left nare Diabetic management with Hgb A1c 9.7 Designated visitors are spouse, [name] and daughter, Jeannetta Nap   Previous Home Environment   Living Arrangements: Spouse/significant other, Children, Other relatives  Lives With: Spouse, Daughter Available Help at Discharge: Family, Available  24 hours/day Type of Home: House Home Layout: One level Home Access: Stairs to enter [employer] of Steps: 2 Bathroom Shower/Tub: Optometrist: Yes How Accessible: Accessible via walker Home Care Services: No Additional Comments: verified by spouse  Discharge Living Setting Plans for Discharge Living Setting: Patient's home, Lives with (comment)(spouse and daughter) Type of Home at Discharge: House Discharge Home Layout: One level Discharge Home Access: Stairs to enter Entrance Stairs-Rails: None Entrance Stairs-Number of Steps: 2 Discharge Bathroom Shower/Tub: Tub/shower unit Discharge Bathroom Toilet: Standard Discharge Bathroom Accessibility: Yes How Accessible: Accessible via walker Does the patient have any problems obtaining your medications?: No  Social/Family/Support Systems Patient Roles: Spouse, Parent(employee) Contact Information: spouse milton Anticipated Caregiver: spouse and daughter Anticipated Caregiver's Contact Information: see above Ability/Limitations of Caregiver: no limitations Caregiver Availability: 24/7 Discharge Plan Discussed with Primary Caregiver: Yes Is Caregiver In Agreement with Plan?: Yes Does Caregiver/Family have Issues with Lodging/Transportation while Pt  is in Rehab?: No  Goals Patient/Family Goal for Rehab: min PT and OT, supervision with SLP Expected length of stay: ELOS 2 to 3 weeks Pt/Family Agrees to Admission and willing to participate: Yes Program Orientation Provided & Reviewed with Pt/Caregiver Including Roles  & Responsibilities: Yes  Decrease burden of Care through IP rehab admission: n/a  Possible need for SNF placement upon discharge:not anticipated  Patient Condition: This patient's medical and functional status has changed since the consult dated: 11/11/2019 in which the Rehabilitation Physician determined and documented that the patient's condition is appropriate for intensive rehabilitative care in an inpatient rehabilitation facility. See "History of Present Illness" (above) for medical update. Functional changes are: mod to max assist. Patient's medical and functional status update has been discussed with the Rehabilitation physician and patient remains appropriate for inpatient rehabilitation. Will admit to inpatient rehab today.  Preadmission Screen Completed By:  Cleatrice Burke, RN, 11/20/2019 11:24 AM ______________________________________________________________________   Discussed status with Dr. Dagoberto Ligas on 11/20/2019 at  1125 and received approval for admission today.  Admission Coordinator:  Cleatrice Burke, time 3361 Date 11/20/2019

## 2019-11-19 NOTE — Progress Notes (Signed)
Calorie Count Note  72 hour calorie count ordered.  Diet: Dysphagia 1 with Nectar Thick Liquids Supplements: Magic Cup TID  Day 1 Results (5/2) Breakfast:328 kcals, 11 grams protein Lunch:338 kcals, 14 grams protein Dinner:196 kcals, 7 grams protein Supplements:3 Glucerna shakes (750 kcals, 27 grams protein)  Total intake: 1612kcal (100% of minimum estimated needs) 59 gramsprotein (78% of minimum estimated needs)  Day 2 Results (5/3) Breakfast: 191 kcals, 11 grams protein Lunch:  563 kcals, 28 grams protein Dinner: 688 kcals, 30 grams protein Supplements: Magic Cup TID (included in meal completion information)  Total intake: 1442 kcal (96% of minimum estimated needs)  69 grams protein (90% of minimum estimated needs)  Day 3 Results (5/4) Breakfast: 512 kcals, 10 grams protein Lunch: 282 kcals, 11 grams protein Dinner: 35 kcals, 2 grams protein Supplements: Magic Cup TID (included in meal completion information)  Total intake: 829 kcal (55% of minimum estimated needs)  23 protein (30% of minimum estimated needs)    Nutrition Dx: Inadequate oral intakerelated to inability to eatas evidenced by NPO status.  Progressing; advanced to dysphagia 1 diet with nectar thick liquids, transitioned to nocturnal TF  Goal: Patient will meet greater than or equal to 90% of their needs  Progressing    Intervention:  -Continue feeding assistance with meals -Continue Magic cup TID with meals, each supplement provides 290 kcal and 9 grams of protein -57ml Pro-stat BID po, each supplement provides 100 kcal and 15 grams protein -Continue nocturnal TF as pt's po intake appears to be trending down:  Glucerna 1.5@ 4ml/hr via cortrak tube over 10 hour period (2000-0600)  150 ml free water flush every 4 hours  Tube feeding regimen provides750kcal (50% of needs),41grams of protein, and 38ml of H2O. Total free water: 1280 ml daily    Larkin Ina, MS,  RD, LDN RD pager number and weekend/on-call pager number located in [place].

## 2019-11-19 NOTE — Progress Notes (Signed)
Physical Therapy Treatment Patient Details Name: Krystal Little MRN: [id number] DOB: May 26, 1956 Today's Date: 11/19/2019    History of Present Illness Pt is 64 y.o. female presented to [facility] ED as a code stroke for  Right gaze deviation and left side weakness. 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 Pt s/p R hemicraniectomy with bone flap placed in abdomen on 4/18.  PMH HTN, DM2, chronic a. Fib (coumadin) s/p pace maker.    PT Comments    Patient continues to make steady progress toward PT goals. Pt with more cervical AROM and able to transfer OOB to recliner utilizing Stedy standing frame. Pt continues to be a good candidate for CIR level therapies.    Follow Up Recommendations  CIR;Supervision/Assistance - 24 hour     Equipment Recommendations  Hospital bed;Wheelchair (measurements PT);Wheelchair cushion (measurements PT);Other (comment)(hoyer)    Recommendations for Other Services Rehab consult     Precautions / Restrictions Precautions Precautions: Fall Precaution Comments: R hemicraniectomy- skull in R abdomen, L shoulder subluxation Restrictions Weight Bearing Restrictions: No    Mobility  Bed Mobility Overal bed mobility: Needs Assistance Bed Mobility: Rolling;Sidelying to Sit Rolling: Min assist;Max assist Sidelying to sit: Max assist;+2 for safety/equipment       General bed mobility comments: cues for sequencing; pt able to roll to L side with min A and max A to roll to R side; pt using rail with R UE; assist to bring bilat LE to EOB and to elevate trunk into sitting   Transfers Overall transfer level: Needs assistance   Transfers: Sit to/from Stand Sit to Stand: Mod assist;+2 physical assistance;From elevated surface         General transfer comment: pt able to stand X 2 using stedy standing frame; assist to power up with L UE supported and to maintain balance in standing   Ambulation/Gait                 Stairs              Wheelchair Mobility    Modified Rankin (Stroke Patients Only) Modified Rankin (Stroke Patients Only) Pre-Morbid Rankin Score: No symptoms Modified Rankin: Severe disability     Balance Overall balance assessment: Needs assistance Sitting-balance support: Single extremity supported;Feet supported Sitting balance-Leahy Scale: Poor     Standing balance support: Bilateral upper extremity supported;During functional activity Standing balance-Leahy Scale: Poor                              Cognition Arousal/Alertness: Awake/alert Behavior During Therapy: Flat affect Overall Cognitive Status: Impaired/Different from baseline Area of Impairment: Attention;Following commands;Problem solving                   Current Attention Level: Sustained   Following Commands: Follows one step commands consistently;Follows one step commands with increased time     Problem Solving: Slow processing;Difficulty sequencing;Requires verbal cues;Requires tactile cues General Comments: keeps eyes closed most of the time; soft voice and slow to respond but appropriate and oriented        Exercises Other Exercises Other Exercises: cervical ROM    General Comments        Pertinent Vitals/Pain Pain Assessment: Faces Faces Pain Scale: Hurts little more Pain Location: L shoulder  Pain Descriptors / Indicators: Discomfort;Guarding;Sore Pain Intervention(s): Limited activity within patient's tolerance;Monitored during session;Repositioned    Home Living  Prior Function            PT Goals (current goals can now be found in the care plan section) Progress towards PT goals: Progressing toward goals    Frequency    Min 4X/week      PT Plan Current plan remains appropriate    Co-evaluation              AM-PAC PT "6 Clicks" Mobility   Outcome Measure  Help needed turning from your back to your side while in a flat bed  without using bedrails?: A Lot Help needed moving from lying on your back to sitting on the side of a flat bed without using bedrails?: A Lot Help needed moving to and from a bed to a chair (including a wheelchair)?: A Lot Help needed standing up from a chair using your arms (e.g., wheelchair or bedside chair)?: A Lot Help needed to walk in hospital room?: Total Help needed climbing 3-5 steps with a railing? : Total 6 Click Score: 10    End of Session   Activity Tolerance: Patient tolerated treatment well Patient left: with call bell/phone within reach;in chair;with chair alarm set Nurse Communication: Mobility status PT Visit Diagnosis: Difficulty in walking, not elsewhere classified (R26.2)     Time: 1127-1202 PT Time Calculation (min) (ACUTE ONLY): 35 min  Charges:  $Therapeutic Activity: 23-37 mins                     Earney Navy, PTA Acute Rehabilitation Services Pager: [phone] Office: [phone]     Darliss Cheney 11/19/2019, 4:58 PM

## 2019-11-19 NOTE — Plan of Care (Signed)
  Problem: Coping: Goal: Will verbalize positive feelings about self Outcome: Progressing Goal: Will identify appropriate support needs Outcome: Progressing   Problem: Self-Care: Goal: Ability to communicate needs accurately will improve Outcome: Progressing

## 2019-11-19 NOTE — Progress Notes (Signed)
  Speech Language Pathology Treatment: Dysphagia  Patient Details Name: Krystal Little MRN: [id number] DOB: 1956-01-05 Today's Date: 11/19/2019 Time: [id number] SLP Time Calculation (min) (ACUTE ONLY): 25 min  Assessment / Plan / Recommendation Clinical Impression  Nurse tech assisting with feeding pt lunch upon arrival of SLP. Pt accepted trials of puree and nectar thick liquid via straw. Slight left anterior leakage noted intermittently. No overt s/s aspiration. Pt's husband was present, and indicated she can feed herself. SLP provided education that assistance is often given primarily for energy conservation to maximize swallow safety.     HPI HPI: 64 y.o. female presented to [facility] ED as a code stroke for R gaze deviation and L-sided weakness. CT revealed a large R MCA infarct. Pt s/p R hemicraniectomy with bone flap placed in abdomen on 4/18. ETT 4/18-4/25. PMH HTN, DM2, chronic a. Fib (coumadin) s/p pace maker.      SLP Plan  Continue with current plan of care       Recommendations  Diet recommendations: Dysphagia 1 (puree);Nectar-thick liquid Liquids provided via: Straw Medication Administration: Crushed with puree Supervision: Patient able to self feed;Full supervision/cueing for compensatory strategies Compensations: Slow rate;Small sips/bites;Minimize environmental distractions Postural Changes and/or Swallow Maneuvers: Seated upright 90 degrees;Upright 30-60 min after meal                Oral Care Recommendations: Oral care BID Follow up Recommendations: Inpatient Rehab SLP Visit Diagnosis: Dysphagia, oropharyngeal phase (R13.12) Plan: Continue with current plan of care       GO              [name] B. Quentin Ore, [facility], [place] Speech Language Pathologist Office: [phone] Pager: [phone]  Shonna Chock 11/19/2019, 2:28 PM

## 2019-11-19 NOTE — Progress Notes (Signed)
STROKE TEAM PROGRESS NOTE   INTERVAL HISTORY Pt husband at bedside. Pt sitting in chair, more responsive than yesterday, still lethargic and worked with PT/OT today. Pending CIR. As per husband, pt ate 100% of breakfast and 50% of lunch. Dietitian still recommend nocturnal feeding as her oral intake is down trending.    OBJECTIVE Vitals:   11/19/19 0315 11/19/19 0756 11/19/19 1017 11/19/19 1202  BP: 139/61 130/63  (!) 127/59  Pulse: 60 60 78 (!) 59  Resp: (!) 23 (!) 22  16  Temp: 98.6 F (37 C) 98.8 F (37.1 C)  98.6 F (37 C)  TempSrc: Axillary Oral  Oral  SpO2: 100% 98%  98%  Weight:      Height:        CBC:  Recent Labs  Lab 11/18/19 0500 11/19/19 0500  WBC 8.4 9.1  HGB 9.7* 9.7*  HCT 30.3* 30.7*  MCV 97.1 97.8  PLT 378 761   Basic Metabolic Panel:  Recent Labs  Lab 11/18/19 0500 11/19/19 0500  NA 137 136  K 3.8 4.0  CL 101 99  CO2 27 26  GLUCOSE 191* 150*  BUN 8 9  CREATININE 0.56 0.65  CALCIUM 8.2* 8.5*   IMAGING past 24h   No results found.   PHYSICAL EXAM   Temp:  [98.4 F (36.9 C)-98.8 F (37.1 C)] 98.6 F (37 C) (05/05 1202) Pulse Rate:  [59-89] 59 (05/05 1202) Resp:  [16-23] 16 (05/05 1202) BP: (125-153)/(59-88) 127/59 (05/05 1202) SpO2:  [98 %-100 %] 98 % (05/05 1202)  General - Well nourished, well developed, lethargic but eyes open.  Ophthalmologic - fundi not visualized due to noncooperation.  Cardiovascular - irregularly irregular heart rate and rhythm.  Neuro - lethargic with eyes open, following simple commands on the right, able to say short sentences and answer questions with brief answer, able to repeat and name, but moderate dysarthria. Left lower quadrantanopia. Right gaze preference, barely cross midline. Left facial droop, left UE 0/5 flaccid LLE slight withdraw to pain. RUE proximal 3/5 and distal 4/5 and RLE 3/5 proximal and 4/5 distally. Left side diminished muscle tone. Negative babinski. Sensation, coordination not  cooperative and gait not tested.   ASSESSMENT/PLAN Krystal Little is a 64 y.o. female with history of HTN, DM2, hx of mitral valve repair, chronic a. Fib (coumadin - INR 1.2 on admission) s/p pacemaker who presented to [facility] ED as a code stroke for right gaze deviation, urinary incontinence and left sided weakness. She did not receive IV t-PA due to anticoagulation with coumadin. IR -> ICA terminus occlusion. Mechanical thrombectomy performed with 2 stent retriever + aspiration passes in the MCA-ICA (solitaire and embotrap), 1 stent retriever + aspiration pass MCA (embotrap) and 1 direct contact aspiration in the right ACA. Complete recanalization achieved (TICI3).  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 Cannot exclude etiology as embolic secondary to atrial fibrillation on warfarin with subtherapeutic INR  CT Head -  Large right MCA territory nonhemorrhagic infarct is described. Hyperdense right MCA suggesting extensive thrombus from the right ICA terminus through the MCA bifurcation Some mass effect with partial effacement the sulci on the right. No midline shift. ASPECTS is 4/10   CTA H&N - Total occlusion of right ICA terminus. Thrombus extends through the right MCA bifurcation with poor collaterals.   CT Perfusion - Confirmed large right MCA territory infarct with core volume of 107 mL. Large mismatch  volume of 9.6 with a mismatch ratio of 1.9.   CT head -  Large area of hypoattenuation within the right MCA territory, consistent with subacute infarct. Multifocal hyperdensity within the right MCA territory is consistent with hemorrhage or contrast staining. The more peripheral hyperdensities are favored to be hemorrhagic. 6 mm leftward midline shift  CT Head 11/02/19 - Worsened edema within the right MCA territory with increased size of right basal ganglia intraparenchymal hematoma. Increased amount of subarachnoid blood over the  right hemisphere. 11 mm of leftward midline shift  MRI head - not performed - PPM   CT head 4/20 postop changed from R crani. Evolution large R MCA infarct w/ interval increase edema/swelling. Increased petechial hemorrhage throughout. SAH also increased. R BG HT slightly increased. Mass effect, partial effacement ventricles w/ 32mm L midline shift   CT 4/22 stable large R MCA infarct w/ diffuse petechial hemorrhage   CT Head 11/09/19 - Continued interval evolution of large right MCA territory infarct with associated hemorrhagic transformation, relatively stable as compared to 11/06/2019. Associated mass effect with up to 12 mm of right-to-left shift not significantly changed. Stable asymmetric dilatation of the left lateral ventricle.  CT head - 11/16/19 - Decreased right hemispheric edema and decreased leftward midline shift.  2D Echo - EF 50-55%. No source of embolus. 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   Sars Corona Virus 2 - negative  LDL - 158  HgbA1c - 9.7  UDS - negative  VTE prophylaxis - SCDs  warfarin daily prior to admission, now on ASA 325mg . Will consider resume AC around 5/7 after CT repeat.  Therapy recommendations:  CIR - admission coordinator following - insurance approval process for CIR underway  Disposition:  Pending  Acute hypoxemia respiratory failure d/t stroke  Intubated for emergent hemicraniectomy for cerebral edema  Now extubated  CXR 4/20 - Complete opacification of the retrocardiac left lung base worrisome for left lower lobe atelectasis.  CXR 4/22 B pulm infiltrates / edema and B pleural effusions  CXR 4/23 - Bilateral pleural effusions, right greater than left, with associated opacity, likely atelectasis.  CXR 11/09/19 - Stable small right effusion with underlying atelectasis. Stableeft retrocardiac opacity. The small layering left effusion seen previously is less prominent.  CXR 4/27: pleural effusions. Pt tachyonic, but no acute  distress  Lasix 40 IV given x1  CXR 11/15/19 - Persistent bilateral pleural effusions, left greater than right. Persistent retrocardiac opacity favored to represent atelectasis.  Chest PT and 3% saline nebs PRN  Cerebral edema w/ subfalcine herniation s/p decompressive hemicraniectomy  CT 11/16/19 - Decreased right hemispheric edema and decreased leftward midline shift.  3% saline @ 50 cc/hr -> NS @ 50 -> off  Na 138->...-151-150-150->148->148->151->144->139->137->137->136  NSG on board s/p [facility] 4/18  PICC line placed  23.4% x 2  Atrial fibrillation w/ RVR  Telemetry showed A. fib, rate controlled  On Coumadin PTA, family stated compliance  Subtherapeutic INR 1.2 on presentation -> 1.4  No AC at the time due to large infarct and hemorrhagic conversion (previously refused DOACs)  metoprolol 50 bid -> 25 bid  On cardizem 60 q 8h->60 q 6h  amiodarone gtt converted to po 4/22 - 400 bid x 1 week then 400 daily -> now discontinued  EP Cardiology on board  HR stable, on the low side - decrease metoprolol to 25 bid  On aspirin 325, will consider resume AC around 5/7  Hypertension->Hypotension  Home BP meds: Atenolol; Cardizem ; lisinopril  Current BP  meds: cardizem, metoprolol 50 bid  Off cleviprex  lisinopril d/c due to low BP  BP improved and stable  On metoprolol 25 bid -> 50 bid -> 25 bid . SBP goal < 160 mm Hg . Long-term BP goal normotensive  Hyperlipidemia  Home Lipid lowering medication: zetia  LDL 158, goal < 70  Now on lipitor 40  Continue statin at discharge  Diabetes, uncontrolled  Home diabetic meds: metformin  HgbA1c 9.7, goal < 7.0  CBG monitoring  Hyperglycemia  on lantus 20 bid, novolog 8u nocturnal TF coverage     Continue SSI 0-9  DM coordinator consulted  Dysphagia  Has cortrak, start on nocturnal feeding  Insulin adjusted for nocturnal feeding  Cleared for D1 nectar diet  Dietitian consult for caloric count -  continue TF w/ prostat and magic sup - intake trending down  Speech following  Other Stroke Risk Factors  Advanced age  ETOH use, advised to drink no more than 1 alcoholic beverage per day.  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 After hours, contact General Neurology

## 2019-11-20 ENCOUNTER — Other Ambulatory Visit: Payer: Self-pay

## 2019-11-20 ENCOUNTER — Encounter (HOSPITAL_COMMUNITY): Payer: Self-pay | Admitting: Physical Medicine & Rehabilitation

## 2019-11-20 ENCOUNTER — Inpatient Hospital Stay (HOSPITAL_COMMUNITY)
Admission: RE | Admit: 2019-11-20 | Discharge: 2019-12-14 | DRG: 057 | Disposition: A | Discharge status: Other Acute Inpatient Hospital | Payer: Medicare Other | Source: Intra-hospital | Attending: Physical Medicine & Rehabilitation | Admitting: Physical Medicine & Rehabilitation

## 2019-11-20 DIAGNOSIS — IMO0002 Reserved for concepts with insufficient information to code with codable children: Secondary | ICD-10-CM | POA: Diagnosis present

## 2019-11-20 DIAGNOSIS — Z8249 Family history of ischemic heart disease and other diseases of the circulatory system: Secondary | ICD-10-CM

## 2019-11-20 DIAGNOSIS — Z9889 Other specified postprocedural states: Secondary | ICD-10-CM | POA: Diagnosis not present

## 2019-11-20 DIAGNOSIS — I482 Chronic atrial fibrillation, unspecified: Secondary | ICD-10-CM | POA: Diagnosis present

## 2019-11-20 DIAGNOSIS — E785 Hyperlipidemia, unspecified: Secondary | ICD-10-CM

## 2019-11-20 DIAGNOSIS — G8194 Hemiplegia, unspecified affecting left nondominant side: Secondary | ICD-10-CM

## 2019-11-20 DIAGNOSIS — Z7901 Long term (current) use of anticoagulants: Secondary | ICD-10-CM | POA: Diagnosis not present

## 2019-11-20 DIAGNOSIS — Z79899 Other long term (current) drug therapy: Secondary | ICD-10-CM | POA: Diagnosis not present

## 2019-11-20 DIAGNOSIS — I69322 Dysarthria following cerebral infarction: Secondary | ICD-10-CM

## 2019-11-20 DIAGNOSIS — Z952 Presence of prosthetic heart valve: Secondary | ICD-10-CM | POA: Diagnosis not present

## 2019-11-20 DIAGNOSIS — I639 Cerebral infarction, unspecified: Secondary | ICD-10-CM | POA: Diagnosis not present

## 2019-11-20 DIAGNOSIS — R0989 Other specified symptoms and signs involving the circulatory and respiratory systems: Secondary | ICD-10-CM

## 2019-11-20 DIAGNOSIS — D62 Acute posthemorrhagic anemia: Secondary | ICD-10-CM

## 2019-11-20 DIAGNOSIS — E876 Hypokalemia: Secondary | ICD-10-CM | POA: Diagnosis not present

## 2019-11-20 DIAGNOSIS — E1165 Type 2 diabetes mellitus with hyperglycemia: Secondary | ICD-10-CM | POA: Diagnosis present

## 2019-11-20 DIAGNOSIS — R414 Neurologic neglect syndrome: Secondary | ICD-10-CM | POA: Diagnosis present

## 2019-11-20 DIAGNOSIS — R7309 Other abnormal glucose: Secondary | ICD-10-CM

## 2019-11-20 DIAGNOSIS — I69390 Apraxia following cerebral infarction: Secondary | ICD-10-CM | POA: Diagnosis not present

## 2019-11-20 DIAGNOSIS — Z833 Family history of diabetes mellitus: Secondary | ICD-10-CM

## 2019-11-20 DIAGNOSIS — J9601 Acute respiratory failure with hypoxia: Secondary | ICD-10-CM

## 2019-11-20 DIAGNOSIS — I63511 Cerebral infarction due to unspecified occlusion or stenosis of right middle cerebral artery: Secondary | ICD-10-CM | POA: Diagnosis not present

## 2019-11-20 DIAGNOSIS — J90 Pleural effusion, not elsewhere classified: Secondary | ICD-10-CM | POA: Diagnosis not present

## 2019-11-20 DIAGNOSIS — I10 Essential (primary) hypertension: Secondary | ICD-10-CM | POA: Diagnosis not present

## 2019-11-20 DIAGNOSIS — I69392 Facial weakness following cerebral infarction: Secondary | ICD-10-CM

## 2019-11-20 DIAGNOSIS — Z794 Long term (current) use of insulin: Secondary | ICD-10-CM

## 2019-11-20 DIAGNOSIS — R0682 Tachypnea, not elsewhere classified: Secondary | ICD-10-CM

## 2019-11-20 DIAGNOSIS — I69391 Dysphagia following cerebral infarction: Secondary | ICD-10-CM

## 2019-11-20 DIAGNOSIS — Z801 Family history of malignant neoplasm of trachea, bronchus and lung: Secondary | ICD-10-CM

## 2019-11-20 DIAGNOSIS — I69354 Hemiplegia and hemiparesis following cerebral infarction affecting left non-dominant side: Secondary | ICD-10-CM | POA: Diagnosis not present

## 2019-11-20 DIAGNOSIS — J942 Hemothorax: Secondary | ICD-10-CM | POA: Diagnosis not present

## 2019-11-20 DIAGNOSIS — L97819 Non-pressure chronic ulcer of other part of right lower leg with unspecified severity: Secondary | ICD-10-CM | POA: Diagnosis present

## 2019-11-20 DIAGNOSIS — R1311 Dysphagia, oral phase: Secondary | ICD-10-CM | POA: Diagnosis not present

## 2019-11-20 DIAGNOSIS — I4891 Unspecified atrial fibrillation: Secondary | ICD-10-CM

## 2019-11-20 DIAGNOSIS — I63411 Cerebral infarction due to embolism of right middle cerebral artery: Secondary | ICD-10-CM | POA: Diagnosis not present

## 2019-11-20 DIAGNOSIS — I050 Rheumatic mitral stenosis: Secondary | ICD-10-CM | POA: Diagnosis present

## 2019-11-20 DIAGNOSIS — K602 Anal fissure, unspecified: Secondary | ICD-10-CM | POA: Diagnosis not present

## 2019-11-20 DIAGNOSIS — E1169 Type 2 diabetes mellitus with other specified complication: Secondary | ICD-10-CM

## 2019-11-20 DIAGNOSIS — Z950 Presence of cardiac pacemaker: Secondary | ICD-10-CM | POA: Diagnosis present

## 2019-11-20 DIAGNOSIS — G4700 Insomnia, unspecified: Secondary | ICD-10-CM | POA: Diagnosis not present

## 2019-11-20 DIAGNOSIS — I83018 Varicose veins of right lower extremity with ulcer other part of lower leg: Secondary | ICD-10-CM | POA: Diagnosis present

## 2019-11-20 DIAGNOSIS — I619 Nontraumatic intracerebral hemorrhage, unspecified: Secondary | ICD-10-CM | POA: Diagnosis not present

## 2019-11-20 DIAGNOSIS — G936 Cerebral edema: Secondary | ICD-10-CM | POA: Diagnosis not present

## 2019-11-20 LAB — GLUCOSE, CAPILLARY
Glucose-Capillary: 114 mg/dL — ABNORMAL HIGH (ref 70–99)
Glucose-Capillary: 118 mg/dL — ABNORMAL HIGH (ref 70–99)
Glucose-Capillary: 136 mg/dL — ABNORMAL HIGH (ref 70–99)
Glucose-Capillary: 189 mg/dL — ABNORMAL HIGH (ref 70–99)
Glucose-Capillary: 207 mg/dL — ABNORMAL HIGH (ref 70–99)
Glucose-Capillary: 237 mg/dL — ABNORMAL HIGH (ref 70–99)
Glucose-Capillary: 246 mg/dL — ABNORMAL HIGH (ref 70–99)
Glucose-Capillary: 287 mg/dL — ABNORMAL HIGH (ref 70–99)

## 2019-11-20 LAB — CREATININE, SERUM
Creatinine, Ser: 0.65 mg/dL (ref 0.44–1.00)
GFR calc Af Amer: >60 mL/min (ref >60)
GFR calc non Af Amer: >60 mL/min (ref >60)

## 2019-11-20 LAB — CBC
HCT: 32.9 % — ABNORMAL LOW (ref 36.0–46.0)
Hemoglobin: 10.4 g/dL — ABNORMAL LOW (ref 12.0–15.0)
MCH: 31 pg (ref 26.0–34.0)
MCHC: 31.6 g/dL (ref 30.0–36.0)
MCV: 98.2 fL (ref 80.0–100.0)
Platelets: 364 10*3/uL (ref 150–400)
RBC: 3.35 MIL/uL — ABNORMAL LOW (ref 3.87–5.11)
RDW: 16.3 % — ABNORMAL HIGH (ref 11.5–15.5)
WBC: 7.4 10*3/uL (ref 4.0–10.5)
nRBC: 0 % (ref 0.0–0.2)

## 2019-11-20 MED ORDER — AMANTADINE HCL 50 MG/5ML PO SYRP: 100.0000 mg | ORAL_SOLUTION | Freq: Two times a day (BID) | ORAL | 0 refills | Status: AC

## 2019-11-20 MED ORDER — PANTOPRAZOLE SODIUM 40 MG PO PACK
40.0000 mg | PACK | Freq: Every day | ORAL | Status: DC
  Administered 2019-11-21 – 2019-11-28 (×8): 40 mg
  Filled 2019-11-20 (×5): qty 20

## 2019-11-20 MED ORDER — POLYETHYLENE GLYCOL 3350 17 G PO PACK: 17.0000 g | PACK | Freq: Two times a day (BID) | ORAL | 0 refills | Status: AC

## 2019-11-20 MED ORDER — ACETAMINOPHEN 325 MG PO TABS
650.0000 mg | ORAL_TABLET | ORAL | Status: DC | PRN
  Administered 2019-11-22 – 2019-11-28 (×5): 650 mg via ORAL
  Filled 2019-11-20 (×5): qty 2

## 2019-11-20 MED ORDER — ACETAMINOPHEN 160 MG/5ML PO SOLN: 650.0000 mg | ORAL | Status: DC | PRN

## 2019-11-20 MED ORDER — ATORVASTATIN CALCIUM 40 MG PO TABS: 40.0000 mg | ORAL_TABLET | Freq: Every day | ORAL | Status: AC

## 2019-11-20 MED ORDER — SENNOSIDES-DOCUSATE SODIUM 8.6-50 MG PO TABS: 1.0000 | ORAL_TABLET | Freq: Every evening | ORAL | Status: AC | PRN

## 2019-11-20 MED ORDER — DILTIAZEM 12 MG/ML ORAL SUSPENSION
60.0000 mg | Freq: Four times a day (QID) | ORAL | Status: DC
  Administered 2019-11-21 – 2019-11-29 (×34): 60 mg
  Filled 2019-11-20 (×36): qty 6

## 2019-11-20 MED ORDER — GLUCERNA 1.5 CAL PO LIQD
400.0000 mL | ORAL | Status: DC
  Filled 2019-11-20: qty 474

## 2019-11-20 MED ORDER — ACETAMINOPHEN 650 MG RE SUPP: 650.0000 mg | RECTAL | Status: DC | PRN

## 2019-11-20 MED ORDER — PRO-STAT SUGAR FREE PO LIQD
30.0000 mL | Freq: Two times a day (BID) | ORAL | Status: DC
  Administered 2019-11-20 – 2019-12-13 (×46): 30 mL via ORAL
  Filled 2019-11-20 (×47): qty 30

## 2019-11-20 MED ORDER — ENOXAPARIN SODIUM 40 MG/0.4ML ~~LOC~~ SOLN
40.0000 mg | SUBCUTANEOUS | Status: DC
  Administered 2019-11-21 – 2019-11-23 (×3): 40 mg via SUBCUTANEOUS
  Filled 2019-11-20 (×3): qty 0.4

## 2019-11-20 MED ORDER — SENNOSIDES-DOCUSATE SODIUM 8.6-50 MG PO TABS
1.0000 | ORAL_TABLET | Freq: Two times a day (BID) | ORAL | Status: DC
  Administered 2019-11-20 – 2019-11-28 (×17): 1
  Filled 2019-11-20 (×17): qty 1

## 2019-11-20 MED ORDER — DILTIAZEM 12 MG/ML ORAL SUSPENSION: 60.0000 mg | Freq: Four times a day (QID) | ORAL | Status: AC

## 2019-11-20 MED ORDER — PRO-STAT SUGAR FREE PO LIQD: 30.0000 mL | Freq: Two times a day (BID) | ORAL | 0 refills | Status: AC

## 2019-11-20 MED ORDER — INSULIN ASPART 100 UNIT/ML ~~LOC~~ SOLN: 8.0000 [IU] | Freq: Two times a day (BID) | SUBCUTANEOUS | 11 refills | Status: AC

## 2019-11-20 MED ORDER — METOPROLOL TARTRATE 25 MG PO TABS: 25.0000 mg | ORAL_TABLET | Freq: Two times a day (BID) | ORAL | Status: AC

## 2019-11-20 MED ORDER — ENOXAPARIN SODIUM 40 MG/0.4ML ~~LOC~~ SOLN: 40.0000 mg | SUBCUTANEOUS | Status: AC

## 2019-11-20 MED ORDER — INSULIN ASPART 100 UNIT/ML ~~LOC~~ SOLN
8.0000 [IU] | Freq: Two times a day (BID) | SUBCUTANEOUS | Status: DC
  Administered 2019-11-20 – 2019-11-23 (×6): 8 [IU] via SUBCUTANEOUS

## 2019-11-20 MED ORDER — INSULIN GLARGINE 100 UNIT/ML ~~LOC~~ SOLN
20.0000 [IU] | Freq: Two times a day (BID) | SUBCUTANEOUS | Status: DC
  Administered 2019-11-20 – 2019-11-22 (×3): 20 [IU] via SUBCUTANEOUS
  Filled 2019-11-20 (×5): qty 0.2

## 2019-11-20 MED ORDER — INSULIN GLARGINE 100 UNIT/ML ~~LOC~~ SOLN: 20.0000 [IU] | Freq: Two times a day (BID) | SUBCUTANEOUS | 11 refills | Status: AC

## 2019-11-20 MED ORDER — ATORVASTATIN CALCIUM 40 MG PO TABS
40.0000 mg | ORAL_TABLET | Freq: Every day | ORAL | Status: DC
  Administered 2019-11-21 – 2019-11-28 (×8): 40 mg
  Filled 2019-11-20 (×8): qty 1

## 2019-11-20 MED ORDER — CHLORHEXIDINE GLUCONATE CLOTH 2 % EX PADS: 6.0000 | MEDICATED_PAD | Freq: Every day | CUTANEOUS | Status: AC

## 2019-11-20 MED ORDER — METOPROLOL TARTRATE 25 MG PO TABS
25.0000 mg | ORAL_TABLET | Freq: Two times a day (BID) | ORAL | Status: DC
  Administered 2019-11-20 – 2019-11-28 (×17): 25 mg
  Filled 2019-11-20 (×18): qty 1

## 2019-11-20 MED ORDER — ASPIRIN 325 MG PO TABS: 325.0000 mg | ORAL_TABLET | Freq: Every day | ORAL | Status: AC

## 2019-11-20 MED ORDER — RESOURCE THICKENUP CLEAR PO POWD: 1.0000 | ORAL | Status: AC | PRN

## 2019-11-20 MED ORDER — ASPIRIN 325 MG PO TABS
325.0000 mg | ORAL_TABLET | Freq: Every day | ORAL | Status: DC
  Administered 2019-11-21 – 2019-11-24 (×4): 325 mg
  Filled 2019-11-20 (×4): qty 1

## 2019-11-20 MED ORDER — RESOURCE THICKENUP CLEAR PO POWD
ORAL | Status: DC | PRN
  Filled 2019-11-20: qty 125

## 2019-11-20 MED ORDER — POLYETHYLENE GLYCOL 3350 17 G PO PACK
17.0000 g | PACK | Freq: Two times a day (BID) | ORAL | Status: DC
  Administered 2019-11-20 – 2019-11-28 (×16): 17 g
  Filled 2019-11-20 (×16): qty 1

## 2019-11-20 MED ORDER — SENNOSIDES-DOCUSATE SODIUM 8.6-50 MG PO TABS: 1.0000 | ORAL_TABLET | Freq: Two times a day (BID) | ORAL | Status: AC

## 2019-11-20 MED ORDER — INSULIN ASPART 100 UNIT/ML ~~LOC~~ SOLN: 0.0000 [IU] | Freq: Three times a day (TID) | SUBCUTANEOUS | 11 refills | Status: AC

## 2019-11-20 MED ORDER — AMANTADINE HCL 50 MG/5ML PO SYRP
100.0000 mg | ORAL_SOLUTION | Freq: Two times a day (BID) | ORAL | Status: DC
  Administered 2019-11-20 – 2019-11-28 (×17): 100 mg
  Filled 2019-11-20 (×18): qty 10

## 2019-11-20 MED ORDER — SENNOSIDES-DOCUSATE SODIUM 8.6-50 MG PO TABS
1.0000 | ORAL_TABLET | Freq: Every evening | ORAL | Status: DC | PRN
  Filled 2019-11-20: qty 1

## 2019-11-20 MED ORDER — GLUCERNA 1.5 CAL PO LIQD: 500.0000 mL | ORAL | Status: AC

## 2019-11-20 MED ORDER — ENOXAPARIN SODIUM 40 MG/0.4ML ~~LOC~~ SOLN
40.0000 mg | SUBCUTANEOUS | Status: DC
Start: 2019-11-20 — End: 2019-11-20

## 2019-11-20 MED ORDER — CHLORHEXIDINE GLUCONATE 0.12% ORAL RINSE (MEDLINE KIT): 15.0000 mL | Freq: Two times a day (BID) | OROMUCOSAL | 0 refills | Status: AC

## 2019-11-20 NOTE — Discharge Summary (Addendum)
Stroke Discharge Summary  Patient ID: Krystal Little   MRN: [id number]      DOB: 25-May-1956  Date of Admission: 10/31/2019 Date of Discharge: 11/20/2019  Attending Physician:  Rosalin Hawking, MD, Stroke MD Consultant(s):  Consuella Lose MD (neurosurgery), Christinia Gully MD ( pulmonary/intensive care ), Will Curt Bears MD (electrophysiology), Courtney Heys MD (Physical Medicine & Rehabilitation), Margie Billet PA for Georganna Skeans MD (trauma/surgery) Patient's PCP:  Stark Klein, MD  Discharge Diagnoses:  Principal Problem:  Cerebrovascular accident (CVA) due to embolism of right middle cerebral artery ([place])  Cerebral edema  S/p craniectomy  Hemorrhagic transformation  Active Problems:   DM (diabetes mellitus), type 2, uncontrolled ([place])   MITRAL REGURGITATION - status post MVR   Atrial fibrillation with RVR ([place])   Long term current use of anticoagulant therapy   Hypertension associated with diabetes ([place])   Diastolic dysfunction without heart failure   Hyperlipidemia associated with type 2 diabetes mellitus ([place])   Acute respiratory failure with hypoxemia ([place])   Hypokalemia  Dysphagia due to recent cerebral infarction   Acute blood loss anemia   Medications to be continued on Rehab Allergies as of 11/20/2019   No Known Allergies     Medication List    STOP taking these medications   atenolol 50 MG tablet Commonly known as: TENORMIN   CHROMIUM PO   diltiazem 180 MG 24 hr capsule Commonly known as: CARDIZEM CD   ezetimibe 10 MG tablet Commonly known as: ZETIA   Gymnema Sylvestris Leaf Powd   lisinopril 5 MG tablet Commonly known as: ZESTRIL   metFORMIN 1000 MG tablet Commonly known as: GLUCOPHAGE   multivitamin with minerals Tabs tablet   warfarin 5 MG tablet Commonly known as: COUMADIN     TAKE these medications   acetaminophen 500 MG tablet Commonly known as: TYLENOL Take 500-1,000 mg by mouth every 6 (six) hours as needed for headache (pain).    amantadine 50 MG/5ML solution Commonly known as: SYMMETREL Place 10 mLs (100 mg total) into feeding tube 2 (two) times daily.   aspirin 325 MG tablet Place 1 tablet (325 mg total) into feeding tube daily. Start taking on: Nov 21, 2019   atorvastatin 40 MG tablet Commonly known as: LIPITOR Place 1 tablet (40 mg total) into feeding tube daily. Start taking on: Nov 21, 2019   chlorhexidine gluconate (MEDLINE KIT) 0.12 % solution Commonly known as: PERIDEX 15 mLs by Mouth Rinse route 2 (two) times daily.   Chlorhexidine Gluconate Cloth 2 % Pads Apply 6 each topically daily at 6 (six) AM. Start taking on: Nov 21, 2019   diltiazem 10 mg/ml  oral suspension Commonly known as: CARDIZEM Place 6 mLs (60 mg total) into feeding tube every 6 (six) hours.   enoxaparin 40 MG/0.4ML injection Commonly known as: LOVENOX Inject 0.4 mLs (40 mg total) into the skin daily.   feeding supplement (GLUCERNA 1.5 CAL) Liqd Place 500 mLs into feeding tube daily.   feeding supplement (PRO-STAT SUGAR FREE 64) Liqd Take 30 mLs by mouth 2 (two) times daily.   insulin aspart 100 UNIT/ML injection Commonly known as: novoLOG Inject 0-9 Units into the skin 3 (three) times daily with meals.   insulin aspart 100 UNIT/ML injection Commonly known as: novoLOG Inject 8 Units into the skin 2 (two) times daily.   insulin glargine 100 UNIT/ML injection Commonly known as: LANTUS Inject 0.2 mLs (20 Units total) into the skin 2 (two) times daily.   metoprolol  tartrate 25 MG tablet Commonly known as: LOPRESSOR Place 1 tablet (25 mg total) into feeding tube 2 (two) times daily.   polyethylene glycol 17 g packet Commonly known as: MIRALAX / GLYCOLAX Place 17 g into feeding tube 2 (two) times daily.   Resource ThickenUp Clear Powd Take 120 g by mouth as needed (nectar thick liquids).   senna-docusate 8.6-50 MG tablet Commonly known as: Senokot-S Place 1 tablet into feeding tube at bedtime as needed for mild  constipation.   senna-docusate 8.6-50 MG tablet Commonly known as: Senokot-S Place 1 tablet into feeding tube 2 (two) times daily.       LABORATORY STUDIES CBC    Component Value Date/Time   WBC 9.1 11/19/2019 0500   RBC 3.14 (L) 11/19/2019 0500   HGB 9.7 (L) 11/19/2019 0500   HGB 15.2 08/18/2019 1120   HCT 30.7 (L) 11/19/2019 0500   HCT 45.9 08/18/2019 1120   PLT 390 11/19/2019 0500   PLT 168 08/18/2019 1120   MCV 97.8 11/19/2019 0500   MCV 94 08/18/2019 1120   MCH 30.9 11/19/2019 0500   MCHC 31.6 11/19/2019 0500   RDW 16.2 (H) 11/19/2019 0500   RDW 12.2 08/18/2019 1120   LYMPHSABS 1.2 10/31/2019 1744   MONOABS 0.3 10/31/2019 1744   EOSABS 0.1 10/31/2019 1744   BASOSABS 0.0 10/31/2019 1744   CMP    Component Value Date/Time   NA 136 11/19/2019 0500   NA 140 08/18/2019 1120   K 4.0 11/19/2019 0500   CL 99 11/19/2019 0500   CO2 26 11/19/2019 0500   GLUCOSE 150 (H) 11/19/2019 0500   BUN 9 11/19/2019 0500   BUN 12 08/18/2019 1120   CREATININE 0.65 11/19/2019 0500   CREATININE 0.73 05/18/2016 0919   CALCIUM 8.5 (L) 11/19/2019 0500   PROT 7.4 10/31/2019 1744   PROT 7.1 08/18/2019 1120   ALBUMIN 4.1 10/31/2019 1744   ALBUMIN 4.4 08/18/2019 1120   AST 25 10/31/2019 1744   ALT 20 10/31/2019 1744   ALKPHOS 92 10/31/2019 1744   BILITOT 1.1 10/31/2019 1744   BILITOT 0.6 08/18/2019 1120   GFRNONAA >60 11/19/2019 0500   GFRNONAA 89 05/18/2016 0919   GFRAA >60 11/19/2019 0500   GFRAA >89 05/18/2016 0919   COAGS Lab Results  Component Value Date   INR 1.3 (H) 11/03/2019   INR 1.4 (H) 11/02/2019   INR 1.2 10/31/2019   Lipid Panel    Component Value Date/Time   CHOL 239 (H) 11/01/2019 0348   CHOL 212 (H) 07/15/2018 0907   TRIG 102 11/04/2019 0639   HDL 69 11/01/2019 0348   HDL 50 07/15/2018 0907   CHOLHDL 3.5 11/01/2019 0348   VLDL 12 11/01/2019 0348   LDLCALC 158 (H) 11/01/2019 0348   LDLCALC 138 (H) 07/15/2018 0907   HgbA1C  Lab Results  Component  Value Date   HGBA1C 9.7 (H) 11/01/2019   Urinalysis    Component Value Date/Time   COLORURINE YELLOW 11/09/2019 1752   APPEARANCEUR CLEAR 11/09/2019 1752   LABSPEC 1.015 11/09/2019 1752   PHURINE 8.0 11/09/2019 1752   GLUCOSEU NEGATIVE 11/09/2019 1752   HGBUR NEGATIVE 11/09/2019 1752   BILIRUBINUR NEGATIVE 11/09/2019 1752   KETONESUR NEGATIVE 11/09/2019 1752   PROTEINUR NEGATIVE 11/09/2019 1752   NITRITE NEGATIVE 11/09/2019 1752   LEUKOCYTESUR NEGATIVE 11/09/2019 1752   Urine Drug Screen     Component Value Date/Time   LABOPIA NONE DETECTED 10/31/2019 [place] DETECTED 10/31/2019 1820  LABBENZ NONE DETECTED 10/31/2019 1820   AMPHETMU NONE DETECTED 10/31/2019 1820   THCU NONE DETECTED 10/31/2019 1820   LABBARB NONE DETECTED 10/31/2019 1820    Alcohol Level    Component Value Date/Time   ETH <10 10/31/2019 1744     SIGNIFICANT DIAGNOSTIC STUDIES CT Code Stroke CTA Head W/WO contrast  Result Date: 10/31/2019 CLINICAL DATA:  Last seen normal 5 hours ago. New onset left-sided weakness. Left facial droop and gaze. EXAM: CT ANGIOGRAPHY HEAD AND NECK CT PERFUSION BRAIN TECHNIQUE: Multidetector CT imaging of the head and neck was performed using the standard protocol during bolus administration of intravenous contrast. Multiplanar CT image reconstructions and MIPs were obtained to evaluate the vascular anatomy. Carotid stenosis measurements (when applicable) are obtained utilizing NASCET criteria, using the distal internal carotid diameter as the denominator. Multiphase CT imaging of the brain was performed following IV bolus contrast injection. Subsequent parametric perfusion maps were calculated using RAPID software. CONTRAST:  159m OMNIPAQUE IOHEXOL 350 MG/ML SOLN COMPARISON:  CT head without contrast 10/31/2019. FINDINGS: CTA NECK FINDINGS Aortic arch: A 4 vessel arch configuration is present. Left vertebral artery originates directly from the arch. No significant  atherosclerotic calcification or stenosis is present. Right carotid system: The right common carotid artery is within normal limits. Atherosclerotic changes are present at the proximal right ICA without a significant stenosis. Cervical right ICA is otherwise. Left carotid system: The left common carotid artery is within normal limits. Minimal atherosclerotic changes are present without significant stenosis. Cervical left ICA is otherwise normal. Vertebral arteries: Right vertebral artery originates from the subclavian artery without significant stenosis. It is the dominant vessel. No significant stenosis is present in either vertebral artery in the neck. Skeleton: Mild degenerative changes are present at C5-6. Uncovertebral spurring is present the right C4-5. No focal lytic or blastic lesions are present. Other neck: The soft tissues the neck are otherwise unremarkable. Upper chest: The lung apices are clear. Thoracic inlet is within normal limits. Review of the MIP images confirms the above findings CTA HEAD FINDINGS Anterior circulation: The right internal carotid artery is occluded at the terminus. Clot extends beyond the right MCA bifurcation. Anterior branches opacify. Poor collateralization is evident posteriorly. The left internal carotid artery is within normal limits through the terminus. Left A1 and M1 segments are normal. Anterior communicating artery is patent. Both ACA vessels fill from the left. Retrograde right A1 flow does not reach the ICA terminus secondary to the T occlusion. Left MCA bifurcation and branch vessels are within normal limits. Posterior circulation: The right vertebral artery is dominant vessel. PICA origins are visualized and within normal limits. Basilar artery is normal. The left posterior cerebral artery originates from the basilar tip. The right posterior cerebral artery is of fetal type is fed from just below the thrombus. PCA branch vessels are within normal limits  bilaterally. Venous sinuses: The dural sinuses are patent. Straight sinus deep cerebral veins are intact. Cortical veins are within normal limits. No vascular malformation is present. Anatomic variants: Fetal type right posterior cerebral artery. Review of the MIP images confirms the above findings CT Brain Perfusion Findings: ASPECTS: 4/10 CBF (<30%) Volume: 1041mPerfusion (Tmax>6.0s) volume: 203103mismatch Volume: 76m79mfarction Location:Right MCA territory IMPRESSION: 1. Confirmed large right MCA territory infarct with core volume of 107 mL. 2. Large mismatch volume of 9.6 with a mismatch ratio of 1.9. 3. T occlusion of right ICA terminus. 4. Thrombus extends through the right MCA bifurcation with poor collaterals. 5. Minimal  atherosclerotic changes at the carotid bifurcations bilaterally without significant stenosis. 6. No significant intracranial disease on the left or in the posterior circulation. 7. Fetal type right posterior cerebral artery originates just below the terminal ICA thrombus. Electronically Signed   By: San Morelle M.D.   On: 10/31/2019 18:17   DG Chest 2 View  Result Date: 11/11/2019 CLINICAL DATA:  Shortness of breath EXAM: CHEST - 2 VIEW COMPARISON:  11/09/2019, 11/08/2019, 11/07/2019 FINDINGS: Esophageal tube tip is below the diaphragm but incompletely visualized. Post sternotomy changes. Similar positioning of right-sided pacing device. Enlarged cardiomediastinal silhouette. Small right and moderate left pleural effusion. Similar dense airspace disease at the left base. Effusion on the left may be slightly increased. IMPRESSION: 1. Esophageal tube tip below the diaphragm but incompletely visualized 2. Left greater than right pleural effusion, possibly slightly increased on the left side 3. Dense airspace disease at the left base without change 4. Mild cardiomegaly Electronically Signed   By: Donavan Foil M.D.   On: 11/11/2019 21:21   DG Abd 1 View  Result Date:  11/09/2019 CLINICAL DATA:  Evaluate NG tube EXAM: ABDOMEN - 1 VIEW COMPARISON:  None. FINDINGS: The NG tube terminates in the stomach in good position. Postoperative changes are seen over the right lower quadrant. IMPRESSION: The NG tube terminates in the stomach, in good position. Electronically Signed   By: Dorise Bullion III M.D   On: 11/09/2019 17:23   CT HEAD WO CONTRAST  Result Date: 11/16/2019 CLINICAL DATA:  Stroke. Postop. EXAM: CT HEAD WITHOUT CONTRAST TECHNIQUE: Contiguous axial images were obtained from the base of the skull through the vertex without intravenous contrast. COMPARISON:  11/09/2019 FINDINGS: Brain: There is decreased right hemispheric edema with decreased leftward midline shift that now measures 5 mm, previously 10 mm. Decreased density of hemorrhage in the right hemisphere. Vascular: Unchanged Skull: Large right craniectomy, unchanged. Sinuses/Orbits: No fluid levels or advanced mucosal thickening of the visualized paranasal sinuses. No mastoid or middle ear effusion. The orbits are normal. IMPRESSION: Decreased right hemispheric edema and decreased leftward midline shift. Electronically Signed   By: Ulyses Jarred M.D.   On: 11/16/2019 04:42   CT HEAD WO CONTRAST  Result Date: 11/09/2019 CLINICAL DATA:  Follow-up examination for acute stroke. EXAM: CT HEAD WITHOUT CONTRAST TECHNIQUE: Contiguous axial images were obtained from the base of the skull through the vertex without intravenous contrast. COMPARISON:  Prior head CT from 11/06/2019. FINDINGS: Brain: There has been continued interval evolution of large right MCA territory infarct, stable in size and distribution as compared to previous. Associated edema is more hypodense as compared to previous, although relatively similar in size and morphology. Associated hemorrhagic transformation with hemorrhage involving the right caudate and lentiform nuclei is not significantly changed. Additional scattered hemorrhage throughout the  infarcted territory also little interval changed. Blood again noted along the right sylvian fissure. Prior right craniectomy with similar swelling of the brain through the craniectomy defect. Persistent right-to-left midline shift measures up to 12 mm, little interval change from previous. Asymmetric dilatation of the left lateral ventricle is stable. Similar basilar cistern crowding. No new findings seen within the left cerebral hemisphere, brainstem, or cerebellum. Vascular: No new hyperdense vessel. Scattered vascular calcifications noted within the carotid siphons. Skull: Right craniectomy without adverse features. Skin staples remain in place. Sinuses/Orbits: Globes and orbital soft tissues within normal limits.Paranasal sinuses remain largely clear. No mastoid effusion. Other: None. IMPRESSION: 1. Continued interval evolution of large right MCA territory infarct with associated  hemorrhagic transformation, relatively stable as compared to 11/06/2019. No evidence for interval hemorrhage or re-bleeding. Associated mass effect with up to 12 mm of right-to-left shift not significantly changed. Stable asymmetric dilatation of the left lateral ventricle. 2. Right craniectomy without adverse features. Electronically Signed   By: Jeannine Boga M.D.   On: 11/09/2019 06:15   CT HEAD WO CONTRAST  Result Date: 11/06/2019 CLINICAL DATA:  Stroke. EXAM: CT HEAD WITHOUT CONTRAST TECHNIQUE: Contiguous axial images were obtained from the base of the skull through the vertex without intravenous contrast. COMPARISON:  CT head without contrast 11/04/2019, 11/02/2019, and 11/01/2019. FINDINGS: Brain: Large right MCA territory infarct is again seen. Hemorrhage the right lentiform nucleus is similar in size and configuration, measuring 4.2 x 2.2 cm on the axial images. Diffuse petechial hemorrhage is present throughout the infarct territory. Subarachnoid blood is noted within the right sylvian fissure. Right craniectomy is  noted with expansion of the hemisphere through the craniotomy. Midline shift persists, measuring 11 mm, stable. Mild prominence of the left lateral ventricle is similar. No acute infarct or hemorrhage is present on the left. The brainstem and cerebellum are within normal limits. Vascular: Atherosclerotic calcifications are present within the cavernous internal carotid arteries bilaterally. Skull: Right craniectomy noted. Calvarium is otherwise unremarkable. Soft tissue changes are noted about the craniectomy. Brain is expanded into the craniectomy. Extracranial soft tissues are otherwise within normal limits. Sinuses/Orbits: The paranasal sinuses and mastoid air cells are clear. The globes and orbits are within normal limits. IMPRESSION: 1. Stable appearance of large right MCA territory infarct with diffuse petechial hemorrhage. 2. Stable mass effect and midline shift. 3. Stable mild prominence of the left lateral ventricle. Electronically Signed   By: San Morelle M.D.   On: 11/06/2019 05:48   CT HEAD WO CONTRAST  Addendum Date: 11/04/2019   ADDENDUM REPORT: 11/04/2019 08:31 ADDENDUM: These results were called by telephone at the time of interpretation on 11/04/2019 at 8:31 am to provider [facility] , who verbally acknowledged these results. Electronically Signed   By: Kellie Simmering DO   On: 11/04/2019 08:31   Result Date: 11/04/2019 CLINICAL DATA:  Stroke, follow-up. EXAM: CT HEAD WITHOUT CONTRAST TECHNIQUE: Contiguous axial images were obtained from the base of the skull through the vertex without intravenous contrast. COMPARISON:  Head CT 11/02/2019. FINDINGS: Brain: Interval decompressive right hemicraniectomy. Continued interval evolution of a large subacute right MCA vascular territory infarct. Continued interval increase in edema and swelling throughout much of the right MCA vascular territory. There is increased petechial hemorrhage within the infarct territory. Additionally, small volume  subarachnoid hemorrhage overlying the right cerebral hemisphere has slightly increased, most notably within the right sylvian fissure. Parenchymal hemorrhage within the right basal ganglia is similar to minimally increased as compared to 11/02/2019. The largest parenchymal hematoma centered within the right lentiform nucleus now measures 4.6 x 2.2 cm (previously 4.5 x 2.0 cm). Similar degree of mass effect with partial effacement of the right greater than left lateral ventricles and third ventricle. Persistent 11 mm leftward midline shift. As before, there is early leftward subfalcine herniation. Persistent partial effacement of the right basal cisterns. No new demarcated cortical infarct is identified. No definite ventricular entrapment on the current exam. Small volume pneumocephalus along the right temporal lobe. Vascular: No hyperdense vessel. Skull: Right hemicraniectomy. Sinuses/Orbits: Visualized orbits demonstrate no acute abnormality. Mild scattered paranasal sinus mucosal thickening. No significant mastoid effusion. IMPRESSION: 1. Postoperative sequela from interval decompressive right hemicraniectomy. 2. Continued interval evolution of  a large right MCA vascular territory subacute infarct with continued interval increase in edema and swelling. Petechial hemorrhage throughout the infarct territory has increased. Small volume subarachnoid hemorrhage overlying the right cerebral hemisphere has also slightly increased. Parenchymal hemorrhage centered within the right basal ganglia is similar to slightly increased as compared to 11/02/2019. 3. Similar degree of mass effect with partial effacement of the ventricular system and 11 mm leftward midline shift. As before, there is early leftward subfalcine herniation and persistent partial effacement of the right basal cisterns. Electronically Signed: By: Kellie Simmering DO On: 11/04/2019 07:41   CT HEAD WO CONTRAST  Result Date: 11/02/2019 CLINICAL DATA:  Stroke  follow-up EXAM: CT HEAD WITHOUT CONTRAST TECHNIQUE: Contiguous axial images were obtained from the base of the skull through the vertex without intravenous contrast. COMPARISON:  None. FINDINGS: Brain: Worsened edema within the right MCA territory with increased size of hyperdense collection in the right basal ganglia consistent with worsening hemorrhage. The intraparenchymal hematoma now measures 4.5 x 2.0 cm. There is 11 mm of leftward midline shift at the level of the foramen of Monro. There is early herniation of the right cingulate gyrus beneath the falx cerebri. The right lateral ventricle is effaced. No ventricular entrapment at this time. Basal cisterns remain patent. Increased amount of subarachnoid blood over the right hemisphere. Vascular: No abnormal hyperdensity of the major intracranial arteries or dural venous sinuses. No intracranial atherosclerosis. Skull: The visualized skull base, calvarium and extracranial soft tissues are normal. Sinuses/Orbits: No fluid levels or advanced mucosal thickening of the visualized paranasal sinuses. No mastoid or middle ear effusion. The orbits are normal. IMPRESSION: 1. Worsened edema within the right MCA territory with increased size of right basal ganglia intraparenchymal hematoma. 2. Increased amount of subarachnoid blood over the right hemisphere. 3. 11 mm of leftward midline shift at the level of the foramen of Monro with early herniation of the right cingulate gyrus beneath the falx cerebri Critical Value/emergent results were called by telephone at the time of interpretation on 11/02/2019 at 3:31 am to provider [facility] , who verbally acknowledged these results. Electronically Signed   By: Ulyses Jarred M.D.   On: 11/02/2019 03:30   CT HEAD WO CONTRAST  Result Date: 11/01/2019 CLINICAL DATA:  Stroke follow-up EXAM: CT HEAD WITHOUT CONTRAST TECHNIQUE: Contiguous axial images were obtained from the base of the skull through the vertex without intravenous  contrast. COMPARISON:  CT perfusion scan 10/31/2019 FINDINGS: Brain: Large area of hypoattenuation within the right MCA territory. The right lateral ventricle is compressed. There is leftward midline shift that measures 6 mm at the level of the foramina of Monro. Multifocal hyperdensity within the right MCA territory is consistent with hemorrhage or contrast staining. The more peripheral hyperdensities are favored to be hemorrhagic. The largest of these measures 6 mm. Vascular: No hyperdense vessel or unexpected calcification. Skull: Normal. Negative for fracture or focal lesion. Sinuses/Orbits: No acute finding. Other: None. IMPRESSION: 1. Large area of hypoattenuation within the right MCA territory, consistent with subacute infarct. 2. Multifocal hyperdensity within the right MCA territory is consistent with hemorrhage or contrast staining. The more peripheral hyperdensities are favored to be hemorrhagic. 3. 6 mm leftward midline shift at the level of the foramina of Monro. Electronically Signed   By: Ulyses Jarred M.D.   On: 11/01/2019 02:45   CT Code Stroke CTA Neck W/WO contrast  Result Date: 10/31/2019 CLINICAL DATA:  Last seen normal 5 hours ago. New onset left-sided weakness. Left  facial droop and gaze. EXAM: CT ANGIOGRAPHY HEAD AND NECK CT PERFUSION BRAIN TECHNIQUE: Multidetector CT imaging of the head and neck was performed using the standard protocol during bolus administration of intravenous contrast. Multiplanar CT image reconstructions and MIPs were obtained to evaluate the vascular anatomy. Carotid stenosis measurements (when applicable) are obtained utilizing NASCET criteria, using the distal internal carotid diameter as the denominator. Multiphase CT imaging of the brain was performed following IV bolus contrast injection. Subsequent parametric perfusion maps were calculated using RAPID software. CONTRAST:  115m OMNIPAQUE IOHEXOL 350 MG/ML SOLN COMPARISON:  CT head without contrast 10/31/2019.  FINDINGS: CTA NECK FINDINGS Aortic arch: A 4 vessel arch configuration is present. Left vertebral artery originates directly from the arch. No significant atherosclerotic calcification or stenosis is present. Right carotid system: The right common carotid artery is within normal limits. Atherosclerotic changes are present at the proximal right ICA without a significant stenosis. Cervical right ICA is otherwise. Left carotid system: The left common carotid artery is within normal limits. Minimal atherosclerotic changes are present without significant stenosis. Cervical left ICA is otherwise normal. Vertebral arteries: Right vertebral artery originates from the subclavian artery without significant stenosis. It is the dominant vessel. No significant stenosis is present in either vertebral artery in the neck. Skeleton: Mild degenerative changes are present at C5-6. Uncovertebral spurring is present the right C4-5. No focal lytic or blastic lesions are present. Other neck: The soft tissues the neck are otherwise unremarkable. Upper chest: The lung apices are clear. Thoracic inlet is within normal limits. Review of the MIP images confirms the above findings CTA HEAD FINDINGS Anterior circulation: The right internal carotid artery is occluded at the terminus. Clot extends beyond the right MCA bifurcation. Anterior branches opacify. Poor collateralization is evident posteriorly. The left internal carotid artery is within normal limits through the terminus. Left A1 and M1 segments are normal. Anterior communicating artery is patent. Both ACA vessels fill from the left. Retrograde right A1 flow does not reach the ICA terminus secondary to the T occlusion. Left MCA bifurcation and branch vessels are within normal limits. Posterior circulation: The right vertebral artery is dominant vessel. PICA origins are visualized and within normal limits. Basilar artery is normal. The left posterior cerebral artery originates from the  basilar tip. The right posterior cerebral artery is of fetal type is fed from just below the thrombus. PCA branch vessels are within normal limits bilaterally. Venous sinuses: The dural sinuses are patent. Straight sinus deep cerebral veins are intact. Cortical veins are within normal limits. No vascular malformation is present. Anatomic variants: Fetal type right posterior cerebral artery. Review of the MIP images confirms the above findings CT Brain Perfusion Findings: ASPECTS: 4/10 CBF (<30%) Volume: 107mPerfusion (Tmax>6.0s) volume: 20341mismatch Volume: 54m1mfarction Location:Right MCA territory IMPRESSION: 1. Confirmed large right MCA territory infarct with core volume of 107 mL. 2. Large mismatch volume of 9.6 with a mismatch ratio of 1.9. 3. T occlusion of right ICA terminus. 4. Thrombus extends through the right MCA bifurcation with poor collaterals. 5. Minimal atherosclerotic changes at the carotid bifurcations bilaterally without significant stenosis. 6. No significant intracranial disease on the left or in the posterior circulation. 7. Fetal type right posterior cerebral artery originates just below the terminal ICA thrombus. Electronically Signed   By: ChriSan Morelle.   On: 10/31/2019 18:17   IR CT Head Ltd  Result Date: 11/03/2019 INDICATION: 64 y14r old female with past medical history significant for hypertension, diabetes mellitus type 2  and chronic AFib on Coumadin, status post pacemaker. She presented to ED with sudden onset right gaze deviation and left-sided weakness on 10/31/2018. Her last seen well was at 1 p.m. on the same day. No intravenous tPA administered given use of Coumadin. NIHSS 14; baseline modified Rankin scale 0. 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 Extensive discussion held with neuro hospitalist attending, patient's daughter and patient's husband. Family informed of the  large infarct already established and the irreversible nature of the injury to most of the ischemic tissue. Expected outcomes discussed such as hemiplegia, possible need for neurosurgical intervention, dependency and even death. It was explained that revascularization could potentially save a small area of spared cortex in the high convexity, but that there was a significant chance that treatment could be futile or result in worsened outcome related to possible complication or increased chance of hemorrhagic transformation. Family understood and requested to proceed with endovascular intervention. Informed consent was signed by patient's husband. EXAM: Diagnostic cerebral angiogram Emergency mechanical thrombectomy Flat panel head CT COMPARISON:  CT/CT angiogram of the head and neck for 6 in 2021. MEDICATIONS: None. ANESTHESIA/SEDATION: General anesthesia performed. CONTRAST:  70 mL Omnipaque 240 FLUOROSCOPY TIME:  Fluoroscopy Time: 42 minutes 42 seconds (937 mGy). COMPLICATIONS: None immediate. TECHNIQUE: Informed written consent was obtained from the patient's husband after a thorough discussion of the procedural risks, benefits and alternatives. All questions were addressed. Maximal Sterile Barrier Technique was utilized including caps, mask, sterile gowns, sterile gloves, sterile drape, hand hygiene and skin antiseptic. A timeout was performed prior to the initiation of the procedure. Using a micropuncture kit and the modified Seldinger technique, access was gained to the right common femoral artery and an 8 French sheath was placed. Then, an [place] balloon guide catheter was navigated over a 6 [place] Berenstein 2 catheter and a 0.035 inch Terumo Glidewire into the aortic arch under fluoroscopic guidance. The catheter was placed into the right common carotid artery and then advanced into the right internal carotid artery. Frontal and lateral angiograms of the head were obtained. FINDINGS: Right ICA  occlusion just distal to the origin of the anterior choroidal artery. PROCEDURE: Under biplane roadmap, a large bore aspiration catheter was navigated over a phenom 21 microcatheter and a synchro support microguidewire into the cavernous segment of the right ICA. The microcatheter was then navigated over the wire into the right M1/MCA. Then, a 6 x 40 mm solitaire stent retriever was deployed spanning the supraclinoid right ICA and M1 segment. The device was allowed to intercalated with the clot for 4 minutes. The microcatheter was removed. The aspiration catheter was advanced to the level of occlusion and connected to a penumbra aspiration pump. The guiding catheter balloon was inflated. The thrombectomy device and aspiration catheter were removed under constant aspiration. Right ICA angiogram showed persistent left ICA terminus occlusion. Under biplane roadmap, a large bore aspiration catheter was navigated over a phenom 21 microcatheter and a synchro support microguidewire into the cavernous segment of the right ICA. The microcatheter was then navigated over the wire into the right M1/MCA. Then, a 5 x 37 mm embotrap stent retriever was deployed spanning the supraclinoid right ICA and M1 segment. The device was allowed to intercalated with the clot for 4 minutes. The microcatheter was removed. The aspiration catheter was advanced to the level of occlusion and connected to a penumbra aspiration pump. The guiding catheter  balloon was inflated. The thrombectomy device and aspiration catheter were removed under constant aspiration. Right ICA angiogram showed recanalization of the ICA terminus and A1 segment with persistent occlusion of the M1 segment. Embolus to the right pericallosal artery (A3 segment) was noted. Under biplane roadmap, a large bore aspiration catheter was navigated over a phenom 21 microcatheter and a synchro support microguidewire into the cavernous segment of the right ICA. The microcatheter was  then navigated over the wire into the right M1/MCA. Then, a 5 x 37 mm embotrap stent retriever was deployed spanning the supraclinoid right ICA and M1 segment. The device was allowed to intercalated with the clot for 4 minutes. The microcatheter was removed. The aspiration catheter was advanced to the level of occlusion and connected to a penumbra aspiration pump. The guiding catheter balloon was inflated. The thrombectomy device and aspiration catheter were removed under constant aspiration. Follow-up right ICA angiograms showed complete recanalization of the right MCA vascular tree with migration of the A3 segment embolus to the A4 segment. Under biplane roadmap, a large bore aspiration catheter was navigated over a 3 max aspiration catheter and a synchro support microguidewire into the cavernous segment of the right ICA. The 3 max was then navigated over the wire into the pericallosal artery A4 segment at the level of occlusionand connected to a penumbra aspiration pump. The guiding catheter balloon was inflated. After 5 minutes of continuous aspiration, the 3 max was retracted. Follow-up right ICA angiogram showed complete recanalization of the right ACA vascular tree with persistent recanalization of the MCA vascular tree (TICI 3). Flat panel CT of the head was obtained and post processed in a separate workstation with concurrent attending physician supervision. Selected images were sent to PACS. Mild swelling and contrast staining of the right MCA territory cortical ribbon, basal ganglia and amygdala was noted with partial effacement of the right lateral ventricle without midline shift. No subarachnoid hemorrhage seen. Right common femoral artery angiograms with right anterior oblique and lateral views showed access at the level of the common femoral artery which has normal caliber, adequate for for device utilization. The 8 French sheath was exchanged over the wire for a Perclose ProGlide which was utilized  for access closure. Immediate hemostasis was achieved. IMPRESSION: 1. Successful mechanical thrombectomy/aspiration of a right ICA terminus "T" occlusion. A total of 3 stent retriever with combined aspiration passes and 1 direct contact aspiration performed for a complete (TICI3) recanalization. 2. No embolus to new territory. 3. No hemorrhagic complication on postprocedural flat panel CT. Although, early ischemic changes related to ongoing infarct with cortical and basal ganglia swelling and contrast staining are noted on postprocedural flat panel CT with partial effacement of the right lateral ventricle. PLAN: Transferred to ICU for close monitoring and swell watch. Electronically Signed   By: Pedro Earls M.D.   On: 11/03/2019 14:44   CT Code Stroke Cerebral Perfusion with contrast  Result Date: 10/31/2019 CLINICAL DATA:  Last seen normal 5 hours ago. New onset left-sided weakness. Left facial droop and gaze. EXAM: CT ANGIOGRAPHY HEAD AND NECK CT PERFUSION BRAIN TECHNIQUE: Multidetector CT imaging of the head and neck was performed using the standard protocol during bolus administration of intravenous contrast. Multiplanar CT image reconstructions and MIPs were obtained to evaluate the vascular anatomy. Carotid stenosis measurements (when applicable) are obtained utilizing NASCET criteria, using the distal internal carotid diameter as the denominator. Multiphase CT imaging of the brain was performed following IV bolus contrast injection. Subsequent parametric  perfusion maps were calculated using RAPID software. CONTRAST:  159m OMNIPAQUE IOHEXOL 350 MG/ML SOLN COMPARISON:  CT head without contrast 10/31/2019. FINDINGS: CTA NECK FINDINGS Aortic arch: A 4 vessel arch configuration is present. Left vertebral artery originates directly from the arch. No significant atherosclerotic calcification or stenosis is present. Right carotid system: The right common carotid artery is within normal limits.  Atherosclerotic changes are present at the proximal right ICA without a significant stenosis. Cervical right ICA is otherwise. Left carotid system: The left common carotid artery is within normal limits. Minimal atherosclerotic changes are present without significant stenosis. Cervical left ICA is otherwise normal. Vertebral arteries: Right vertebral artery originates from the subclavian artery without significant stenosis. It is the dominant vessel. No significant stenosis is present in either vertebral artery in the neck. Skeleton: Mild degenerative changes are present at C5-6. Uncovertebral spurring is present the right C4-5. No focal lytic or blastic lesions are present. Other neck: The soft tissues the neck are otherwise unremarkable. Upper chest: The lung apices are clear. Thoracic inlet is within normal limits. Review of the MIP images confirms the above findings CTA HEAD FINDINGS Anterior circulation: The right internal carotid artery is occluded at the terminus. Clot extends beyond the right MCA bifurcation. Anterior branches opacify. Poor collateralization is evident posteriorly. The left internal carotid artery is within normal limits through the terminus. Left A1 and M1 segments are normal. Anterior communicating artery is patent. Both ACA vessels fill from the left. Retrograde right A1 flow does not reach the ICA terminus secondary to the T occlusion. Left MCA bifurcation and branch vessels are within normal limits. Posterior circulation: The right vertebral artery is dominant vessel. PICA origins are visualized and within normal limits. Basilar artery is normal. The left posterior cerebral artery originates from the basilar tip. The right posterior cerebral artery is of fetal type is fed from just below the thrombus. PCA branch vessels are within normal limits bilaterally. Venous sinuses: The dural sinuses are patent. Straight sinus deep cerebral veins are intact. Cortical veins are within normal  limits. No vascular malformation is present. Anatomic variants: Fetal type right posterior cerebral artery. Review of the MIP images confirms the above findings CT Brain Perfusion Findings: ASPECTS: 4/10 CBF (<30%) Volume: 1043mPerfusion (Tmax>6.0s) volume: 20384mismatch Volume: 22m15mfarction Location:Right MCA territory IMPRESSION: 1. Confirmed large right MCA territory infarct with core volume of 107 mL. 2. Large mismatch volume of 9.6 with a mismatch ratio of 1.9. 3. T occlusion of right ICA terminus. 4. Thrombus extends through the right MCA bifurcation with poor collaterals. 5. Minimal atherosclerotic changes at the carotid bifurcations bilaterally without significant stenosis. 6. No significant intracranial disease on the left or in the posterior circulation. 7. Fetal type right posterior cerebral artery originates just below the terminal ICA thrombus. Electronically Signed   By: ChriSan Morelle.   On: 10/31/2019 18:17   DG CHEST PORT 1 VIEW  Result Date: 11/15/2019 CLINICAL DATA:  Line placement EXAM: PORTABLE CHEST 1 VIEW COMPARISON:  11/11/2019 FINDINGS: The enteric tube extends below the left hemidiaphragm. The tip is not visualized on this exam. There is a left-sided PICC line that is stable in positioning. There is a right-sided dual chamber pacemaker in place with stable positioning of the leads. The heart size is stable. There are bilateral pleural effusions, left greater than right. There is a dense retrocardiac opacity favored to represent atelectasis. There is no pneumothorax. There is mild volume overload without overt pulmonary edema. IMPRESSION:  1. Lines and tubes as above. 2. Persistent bilateral pleural effusions, left greater than right. 3. Persistent retrocardiac opacity favored to represent atelectasis. Electronically Signed   By: Constance Holster M.D.   On: 11/15/2019 17:20   DG CHEST PORT 1 VIEW  Result Date: 11/09/2019 CLINICAL DATA:  S/p extubation. EXAM: PORTABLE  CHEST 1 VIEW COMPARISON:  11/09/2019 FINDINGS: An endotracheal tube and NG tube have been removed. A RIGHT pleural effusion, RIGHT basilar atelectasis, and LEFT LOWER lung consolidation/atelectasis again noted. Median sternotomy, RIGHT-sided pacemaker and LEFT PICC line again identified. IMPRESSION: Endotracheal tube and NG tube removal without other significant change. Electronically Signed   By: Margarette Canada M.D.   On: 11/09/2019 17:02   DG CHEST PORT 1 VIEW  Result Date: 11/09/2019 CLINICAL DATA:  Code stroke.  Patient intubated. EXAM: PORTABLE CHEST 1 VIEW COMPARISON:  November 08, 2019 FINDINGS: The ETT is in good position. The left PICC line is in good position. The NG tube terminates below today's film. Stable pacemaker. Small right effusion with underlying opacity, similar in the interval. Left retrocardiac opacity remains with a probable small associated left effusion which is less prominent the interval. No other interval changes. IMPRESSION: 1. Support apparatus as above. 2. Stable small right effusion with underlying atelectasis. Stable left retrocardiac opacity. The small layering left effusion seen previously is less prominent. Electronically Signed   By: Dorise Bullion III M.D   On: 11/09/2019 11:54   DG CHEST PORT 1 VIEW  Result Date: 11/08/2019 CLINICAL DATA:  ETT EXAM: PORTABLE CHEST 1 VIEW COMPARISON:  November 07, 2019 FINDINGS: Stable left PICC line. No pneumothorax. The ETT is in good position. The NG tube terminates below today's film. The pacemaker stable. Bilateral pleural effusions, right greater than left, with underlying opacity, likely atelectasis. No overt edema. No other abnormalities or changes. IMPRESSION: 1. Support apparatus as above. 2. Small bilateral pleural effusions with underlying opacities, likely atelectasis. No other changes. Electronically Signed   By: Dorise Bullion III M.D   On: 11/08/2019 12:00   DG CHEST PORT 1 VIEW  Result Date: 11/07/2019 CLINICAL DATA:   Evaluate pleural effusion. EXAM: PORTABLE CHEST 1 VIEW COMPARISON:  November 06, 2019 FINDINGS: The ETT is in good position. The NG tube terminates below today's film. A left PICC line is stable, terminating in the central SVC. No pneumothorax. A layering pleural effusion with underlying opacity in the right base is stable. A smaller layering effusion on the left with associated atelectasis is stable. No other changes. IMPRESSION: 1. Support apparatus as above. 2. Bilateral pleural effusions, right greater than left, with associated opacity, likely atelectasis. Recommend clinical correlation and attention on follow-up. Electronically Signed   By: Dorise Bullion III M.D   On: 11/07/2019 12:46   DG CHEST PORT 1 VIEW  Result Date: 11/06/2019 CLINICAL DATA:  Intubation. EXAM: PORTABLE CHEST 1 VIEW COMPARISON:  Chest x-ray 11/04/2019. FINDINGS: Endotracheal tube, NG tube, left PICC line in stable position. Cardiac pacer in stable position. Prior median sternotomy. Heart size stable. Bilateral pulmonary infiltrates/edema and bilateral pleural effusions. No pneumothorax. IMPRESSION: 1.  Lines and tubes in stable position. 2. Bilateral pulmonary infiltrates/edema and bilateral pleural effusions noted on today's exam. 3. Cardiac pacer stable position. Prior median sternotomy. Heart size stable. Electronically Signed   By: Marcello Moores  Register   On: 11/06/2019 05:45   DG Chest Port 1 View  Result Date: 11/04/2019 CLINICAL DATA:  Respiratory failure with hypoxia. EXAM: PORTABLE CHEST 1 VIEW COMPARISON:  11/03/2019 FINDINGS: ET tube tip is above the carina. There is a left arm PICC line with tip at the cavoatrial junction. Right chest wall pacer device is noted with leads in the right atrial appendage and right ventricle. Cardiac enlargement. New left lung base retrocardiac opacity is identified which obscures the left hemidiaphragm concerning for left lower lobe atelectasis and or consolidation. IMPRESSION: 1. Complete  opacification of the retrocardiac left lung base worrisome for left lower lobe atelectasis. 2. The left arm PICC line tip is at the cavoatrial junction. Electronically Signed   By: Kerby Moors M.D.   On: 11/04/2019 10:13   DG Chest Port 1 View  Result Date: 11/03/2019 CLINICAL DATA:  Acute respiratory failure with hypoxia. EXAM: PORTABLE CHEST 1 VIEW COMPARISON:  November 02, 2019. FINDINGS: Stable cardiomediastinal silhouette. No pneumothorax or pleural effusion is noted. Endotracheal and nasogastric tubes are unchanged in position. Left-sided PICC line is unchanged. Stable right-sided pacemaker. Both lungs are clear. The visualized skeletal structures are unremarkable. IMPRESSION: Stable support apparatus. No acute cardiopulmonary abnormality seen. Electronically Signed   By: Marijo Conception M.D.   On: 11/03/2019 08:10   DG CHEST PORT 1 VIEW  Result Date: 11/02/2019 CLINICAL DATA:  Intubated EXAM: PORTABLE CHEST 1 VIEW COMPARISON:  Chest radiograph from one day prior. FINDINGS: Stable configuration of 2 lead right subclavian pacemaker. Intact sternotomy wires. Left PICC terminates over the cavoatrial junction. Endotracheal tube tip is 2.1 cm above the carina. Enteric tube terminates in the proximal stomach. Stable cardiomediastinal silhouette with mild cardiomegaly. No pneumothorax. No pleural effusion. No overt pulmonary edema. No acute consolidative airspace disease. IMPRESSION: 1. Well-positioned support structures. No pneumothorax. 2. Stable mild cardiomegaly without overt pulmonary edema. No active pulmonary disease. Electronically Signed   By: Ilona Sorrel M.D.   On: 11/02/2019 11:51   DG CHEST PORT 1 VIEW  Result Date: 11/01/2019 CLINICAL DATA:  PICC repositioning EXAM: PORTABLE CHEST 1 VIEW COMPARISON:  Chest radiograph from earlier today. FINDINGS: Well-positioned left PICC with tip overlying the cavoatrial junction. Intact sternotomy wires. Stable configuration of 2 lead right subclavian  pacemaker. Stable cardiomediastinal silhouette with borderline mild cardiomegaly. No pneumothorax. No pleural effusion. Lungs appear clear, with no acute consolidative airspace disease and no pulmonary edema. IMPRESSION: 1. Well-positioned left PICC with tip overlying the cavoatrial junction. 2. Borderline mild cardiomegaly. No pulmonary edema. No active pulmonary disease. Electronically Signed   By: Ilona Sorrel M.D.   On: 11/01/2019 18:47   DG CHEST PORT 1 VIEW  Result Date: 11/01/2019 CLINICAL DATA:  PICC placement EXAM: PORTABLE CHEST 1 VIEW COMPARISON:  Chest radiograph from one day prior. FINDINGS: Stable configuration of 2 lead right subclavian pacemaker. Intact sternotomy wires. Malpositioned left PICC with tip oriented superiorly in high right mediastinum. Stable cardiomediastinal silhouette with top-normal heart size. No pneumothorax. No pleural effusion. No overt pulmonary edema. No acute consolidative airspace disease. IMPRESSION: 1. Malpositioned left PICC with tip oriented superiorly in the high right mediastinum, recommend repositioning. 2. No active cardiopulmonary disease. These results were called by telephone at the time of interpretation on 11/01/2019 at 5:59 pm to provider [facility] , who verbally acknowledged these results. Electronically Signed   By: Ilona Sorrel M.D.   On: 11/01/2019 18:04   DG Chest Port 1 View  Result Date: 10/31/2019 CLINICAL DATA:  Stroke. EXAM: PORTABLE CHEST 1 VIEW COMPARISON:  11/26/2008 FINDINGS: Right-sided pacemaker in place. Post median sternotomy. Mild cardiomegaly. No pulmonary edema, focal airspace disease, pleural effusion or pneumothorax.  Bones appear under mineralized. IMPRESSION: Mild cardiomegaly. No acute chest findings. Electronically Signed   By: Keith Rake M.D.   On: 10/31/2019 21:54   DG Swallowing Func-Speech Pathology  Result Date: 11/14/2019 Objective Swallowing Evaluation: Type of Study: MBS-Modified Barium Swallow Study  Patient  Details Name: Krystal Little MRN: [id number] Date of Birth: 01-01-1956 Today's Date: 11/14/2019 Time: SLP Start Time (ACUTE ONLY): 68 -SLP Stop Time (ACUTE ONLY): 1300 SLP Time Calculation (min) (ACUTE ONLY): 30 min Past Medical History: Past Medical History: Diagnosis Date . Chronic atrial fibrillation ([place]) 1990s  s/p DCCV then attempted ablation of complex A Flutter (@ [facility] - Dr. Deno Etienne), failed antiarrhythmics --> INR folllwed @ [facility] ->> now status post pacemaker placement with underlying A. fib. . Diabetes mellitus type 2, uncontrolled, without complications   On Oral Medications ([facility]) . Essential hypertension  . History of cardiac catheterization 2001  R&LHC - normal Coronaries, no evidence of Restictive Cardiomyopathy or Constrictive Pericarditis (also @ [facility]) . Hx of sick sinus syndrome 07/1999  Wtih symptomatic bradycardia - syncope ([place]) . 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 CARDIAC CATHETERIZATION  08/03/1999  [place] - normal Coronaries; no sign of constriction or restrictive cardiomypathy . CRANIECTOMY FOR DEPRESSED SKULL FRACTURE Right 11/02/2019  Procedure: DECOMPRESSIVE HEMI -CRANIECTOMY, IMPLANTATION OF SKULL FLAP TO RIGHT ABDOMEN;  Surgeon: Consuella Lose, MD;  Location: [place];  Service: Neurosurgery;  Laterality: Right; . IR CT HEAD LTD  10/31/2019 . IR PERCUTANEOUS ART THROMBECTOMY/INFUSION INTRACRANIAL INC DIAG ANGIO  10/31/2019    . IR PERCUTANEOUS ART THROMBECTOMY/INFUSION INTRACRANIAL INC DIAG ANGIO  10/31/2019 . MITRAL VALVE REPAIR  07/1999   Both Ant& Post leaflet repair - quadrangular resection&caudal transposition, #32 Sequin Annuloplasty ring . PACEMAKER GENERATOR CHANGE  11/26/2008  Medtronic Adapta L(? if it has been checked since 02/2013) . PACEMAKER INSERTION  07/1999  for SSS in setting of Chronic Afib; Medtronic Kappa [id number], [id number] H. Marland Kitchen RADIOLOGY WITH ANESTHESIA N/A 10/31/2019  Procedure: IR WITH ANESTHESIA;  Surgeon: Radiologist, Medication, MD;  Location: [place];  Service: Radiology;  Laterality: N/A; . TRANSTHORACIC ECHOCARDIOGRAM  05/2018  Normal LV size and thickness.  EF estimated 50%.  Inferobasal HK and flattened septum c/w with elevated PA pressures. Mild functional mitral stenosis at rest (postoperative).  Moderate left atrial dilation and mild right atrial dilation.  Mean PA pressures estimated 52 mmHg. HPI: 64 y.o. female presented to [facility] ED as a code stroke for R gaze deviation and L-sided weakness. CT revealed a large R MCA infarct. Pt s/p R hemicraniectomy with bone flap placed in abdomen on 4/18. ETT 4/18-4/25. PMH HTN, DM2, chronic a. Fib (coumadin) s/p pace maker.  Subjective: pt mostly keeps eyes closed, does follow some commands Assessment / Plan / Recommendation CHL IP CLINICAL IMPRESSIONS 11/14/2019 Clinical Impression   Pt demonstrates excellent arousal and participation in MBS today. Pt able to sustain eye opening once up in MBS chair, grasp cup lift head and initaite straw sips or self feed puree with a spoon. She had more significant struggle paricipating when being fed with total assist  with a spoon. Primary impairment is oral phase dysphagia with decreased automaticity of lingual paricipation in bolus formation and posterior propulsion. Pt uses a slow thrusting movement for gradual spillage to the pharynx. Upright, neutral head posture is important to aid oral transit. There is pooling in the pharynx with all boluses as pt completes oral transit and triggers swallow. This process is slowest with spoons of puree and  nectar and improved with self fed straw sips of nectar. Sensed aspiration only occurred with thin liquids via straw, though cough was insufficient to eject. Pt also had some mild oral residuals with all boluses that coated base of tongue and pharyngeal sinuses post swallow. Working on a dry swallow would be a good therapy target. Will recommend puree diet with nectar thick liquids via straw and f/u closely for tolerance.  SLP Visit Diagnosis Dysphagia, oropharyngeal phase (R13.12) Attention and concentration deficit following -- Frontal lobe and executive function deficit following -- Impact on safety and function Moderate aspiration risk   CHL IP TREATMENT RECOMMENDATION 11/14/2019 Treatment Recommendations Therapy as outlined in treatment plan below   Prognosis 11/14/2019 Prognosis for Safe Diet Advancement Good Barriers to Reach Goals -- Barriers/Prognosis Comment -- CHL IP DIET RECOMMENDATION 11/14/2019 SLP Diet Recommendations Dysphagia 1 (Puree) solids Liquid Administration via Spoon Medication Administration -- Compensations Slow rate;Small sips/bites Postural Changes --   CHL IP OTHER RECOMMENDATIONS 11/14/2019 Recommended Consults -- Oral Care Recommendations -- Other Recommendations Have oral suction available   CHL IP FOLLOW UP RECOMMENDATIONS 11/14/2019 Follow up Recommendations Inpatient Rehab   CHL IP FREQUENCY AND DURATION 11/14/2019 Speech Therapy Frequency (ACUTE ONLY) min 2x/week Treatment Duration 2 weeks      CHL IP ORAL PHASE 11/14/2019 Oral Phase Impaired Oral - Pudding Teaspoon -- Oral - Pudding Cup -- Oral - Honey Teaspoon -- Oral - Honey Cup -- Oral - Nectar Teaspoon Lingual pumping;Reduced posterior propulsion;Left anterior bolus loss;Right anterior bolus loss;Lingual/palatal residue;Decreased bolus cohesion;Delayed oral transit;Premature spillage Oral - Nectar Cup Lingual pumping;Reduced posterior propulsion;Left anterior bolus loss;Right anterior bolus loss;Lingual/palatal residue;Decreased  bolus cohesion;Delayed oral transit;Premature spillage Oral - Nectar Straw Lingual pumping;Reduced posterior propulsion;Lingual/palatal residue;Decreased bolus cohesion;Delayed oral transit;Premature spillage Oral - Thin Teaspoon Lingual pumping;Reduced posterior propulsion;Lingual/palatal residue;Decreased bolus cohesion;Delayed oral transit;Premature spillage Oral - Thin Cup -- Oral - Thin Straw Lingual pumping;Reduced posterior propulsion;Lingual/palatal residue;Decreased bolus cohesion;Delayed oral transit;Premature spillage Oral - Puree Lingual pumping;Reduced posterior propulsion;Lingual/palatal residue;Decreased bolus cohesion;Delayed oral transit;Premature spillage Oral - Mech Soft -- Oral - Regular -- Oral - Multi-Consistency -- Oral - Pill -- Oral Phase - Comment --  CHL IP PHARYNGEAL PHASE 11/14/2019 Pharyngeal Phase Impaired Pharyngeal- Pudding Teaspoon -- Pharyngeal -- Pharyngeal- Pudding Cup -- Pharyngeal -- Pharyngeal- Honey Teaspoon -- Pharyngeal -- Pharyngeal- Honey Cup -- Pharyngeal -- Pharyngeal- Nectar Teaspoon Delayed swallow initiation-pyriform sinuses Pharyngeal -- Pharyngeal- Nectar Cup Delayed swallow initiation-pyriform sinuses Pharyngeal -- Pharyngeal- Nectar Straw Delayed swallow initiation-pyriform sinuses Pharyngeal -- Pharyngeal- Thin Teaspoon Delayed swallow initiation-pyriform sinuses Pharyngeal -- Pharyngeal- Thin Cup -- Pharyngeal -- Pharyngeal- Thin Straw Delayed swallow initiation-pyriform sinuses;Penetration/Aspiration during swallow;Penetration/Apiration after swallow;Moderate aspiration Pharyngeal Material enters airway, passes BELOW cords and not ejected out despite cough attempt by patient Pharyngeal- Puree Delayed swallow initiation-pyriform sinuses Pharyngeal -- Pharyngeal- Mechanical Soft -- Pharyngeal -- Pharyngeal- Regular -- Pharyngeal -- Pharyngeal- Multi-consistency -- Pharyngeal -- Pharyngeal- Pill -- Pharyngeal -- Pharyngeal Comment --  No flowsheet data found.  DeBlois, Katherene Ponto 11/14/2019, 1:57 PM              ECHOCARDIOGRAM  COMPLETE  Result Date: 11/02/2019    ECHOCARDIOGRAM REPORT   Patient Name:   Krystal Little Date of Exam: 11/02/2019 Medical Rec #:  [id number]        Height:       64.0 in Accession #:    [id number]       Weight:       140.2 lb Date of Birth:  [birth year]         BSA:          1.682 m Patient Age:    35 years         BP:           111/73 mmHg Patient Gender: F                HR:           82 bpm. Exam Location:  Inpatient Procedure: 2D Echo, Cardiac Doppler and Color Doppler Indications:    Stroke 434.91/I163.9  History:        Patient has prior history of Echocardiogram examinations, most                 recent 05/27/2018. Pacemaker, Mitral Valve Disease; Risk                 Factors:Diabetes, Hypertension and Dyslipidemia. S/p mitral                 valve repair.  Sonographer:    Clayton Lefort RDCS (AE) Referring Phys: [id number] [place]  Sonographer Comments: Echo performed with patient supine and on artificial respirator. IMPRESSIONS  1. Left ventricular ejection fraction, by estimation, is 50 to 55%. The left ventricle has low normal function. There is moderate left ventricular hypertrophy. Left ventricular diastolic parameters are indeterminate.  2. Right ventricular systolic function is moderately reduced. The right ventricular size is normal.  3. Left atrial size was moderately dilated.  4. The mitral valve has been repaired. There is a prosthetic annuloplasty ring present in the mitral position. No evidence of mitral valve regurgitation. The mean mitral valve gradient is 4.0 mmHg.  5. Tricuspid valve regurgitation is moderate.  6. The aortic valve was not well visualized. Aortic valve regurgitation is not visualized. No aortic stenosis is present. FINDINGS  Left Ventricle: Left ventricular ejection fraction, by estimation, is 50 to 55%. The left ventricle has low normal function. The left ventricle has no regional wall motion  abnormalities. The left ventricular internal cavity size was small. There is moderate left ventricular hypertrophy. Left ventricular diastolic parameters are indeterminate. Right Ventricle: The right ventricular size is normal. Right vetricular wall thickness was not assessed. Right ventricular systolic function is moderately reduced. There is mildly elevated pulmonary artery systolic pressure. The tricuspid regurgitant velocity is 2.76 m/s, and with an assumed right atrial pressure of 10 mmHg, the estimated right ventricular systolic pressure is 37.8 mmHg. Left Atrium: Left atrial size was moderately dilated. Right Atrium: Right atrial size was normal in size. Pericardium: Trivial pericardial effusion is present. Mitral Valve: The mitral valve has been repaired/replaced. No evidence of mitral valve regurgitation. There is a prosthetic annuloplasty ring present in the mitral position. MV peak gradient, 10.5 mmHg. The mean mitral valve gradient is 4.0 mmHg. Tricuspid Valve: The tricuspid valve is normal in structure. Tricuspid valve regurgitation is moderate. Aortic Valve: The aortic valve was not well visualized. Aortic valve regurgitation is not visualized. No aortic stenosis is present. Aortic valve mean gradient measures 2.0 mmHg. Aortic valve  peak gradient measures 4.5 mmHg. Aortic valve area, by VTI measures 1.89 cm. Pulmonic Valve: The pulmonic valve was not well visualized. Pulmonic valve regurgitation is not visualized. Aorta: The aortic root and ascending aorta are structurally normal, with no evidence of dilitation. Venous: IVC assessment for right atrial pressure unable to be performed due to mechanical ventilation. IAS/Shunts: No atrial level shunt detected by color flow Doppler.  LEFT VENTRICLE PLAX 2D LVIDd:         3.60 cm LVIDs:         2.70 cm LV PW:         2.00 cm LV IVS:        1.40 cm LVOT diam:     1.90 cm LV SV:         37 LV SV Index:   22 LVOT Area:     2.84 cm  RIGHT VENTRICLE             IVC RV Basal diam:  2.80 cm    IVC diam: 2.40 cm RV S prime:     5.51 cm/s TAPSE (M-mode): 1.0 cm LEFT ATRIUM              Index       RIGHT ATRIUM           Index LA diam:        4.60 cm  2.73 cm/m  RA Area:     15.00 cm LA Vol (A2C):   101.0 ml 60.04 ml/m RA Volume:   38.50 ml  22.89 ml/m LA Vol (A4C):   52.8 ml  31.39 ml/m LA Biplane Vol: 74.5 ml  44.29 ml/m  AORTIC VALVE AV Area (Vmax):    1.82 cm AV Area (Vmean):   1.77 cm AV Area (VTI):     1.89 cm AV Vmax:           106.00 cm/s AV Vmean:          74.400 cm/s AV VTI:            0.198 m AV Peak Grad:      4.5 mmHg AV Mean Grad:      2.0 mmHg LVOT Vmax:         68.14 cm/s LVOT Vmean:        46.340 cm/s LVOT VTI:          0.132 m LVOT/AV VTI ratio: 0.67  AORTA Ao Root diam: 3.00 cm Ao Asc diam:  3.00 cm MITRAL VALVE            TRICUSPID VALVE MV Peak grad: 10.5 mmHg TR Peak grad:   30.5 mmHg MV Mean grad: 4.0 mmHg  TR Vmax:        276.00 cm/s MV Vmax:      1.62 m/s MV Vmean:     91.8 cm/s SHUNTS                         Systemic VTI:  0.13 m                         Systemic Diam: 1.90 cm Oswaldo Milian MD Electronically signed by Oswaldo Milian MD Signature Date/Time: 11/02/2019/7:09:04 PM    Final    CUP PACEART INCLINIC DEVICE CHECK  Result Date: 10/30/2019 Pacemaker check in clinic. Normal device function. Thresholds, sensing, impedances consistent with previous measurements. Device programmed to maximize longevity. No high ventricular rates  noted. Device programmed at appropriate safety margins. Histogram  distribution appropriate for patient activity level. Device programmed to optimize intrinsic conduction. Estimated longevity4 years. Patient enrolled in remote follow-up. Patient education completed. ROV w/ WC.Lavenia Atlas, BSN, RN  IR PERCUTANEOUS ART THROMBECTOMY/INFUSION INTRACRANIAL INC DIAG ANGIO  Result Date: 11/03/2019 INDICATION: 64 year old female with past medical history significant for hypertension, diabetes mellitus  type 2 and chronic AFib on Coumadin, status post pacemaker. She presented to ED with sudden onset right gaze deviation and left-sided weakness on 10/31/2018. Her last seen well was at 1 p.m. on the same day. No intravenous tPA administered given use of Coumadin. NIHSS 14; baseline modified Rankin scale 0. 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 Extensive discussion held with neuro hospitalist attending, patient's daughter and patient's husband. Family informed of the large infarct already established and the irreversible nature of the injury to most of the ischemic tissue. Expected outcomes discussed such as hemiplegia, possible need for neurosurgical intervention, dependency and even death. It was explained that revascularization could potentially save a small area of spared cortex in the high convexity, but that there was a significant chance that treatment could be futile or result in worsened outcome related to possible complication or increased chance of hemorrhagic transformation. Family understood and requested to proceed with endovascular intervention. Informed consent was signed by patient's husband. EXAM: Diagnostic cerebral angiogram Emergency mechanical thrombectomy Flat panel head CT COMPARISON:  CT/CT angiogram of the head and neck for 6 in 2021. MEDICATIONS: None. ANESTHESIA/SEDATION: General anesthesia performed. CONTRAST:  70 mL Omnipaque 240 FLUOROSCOPY TIME:  Fluoroscopy Time: 42 minutes 42 seconds (937 mGy). COMPLICATIONS: None immediate. TECHNIQUE: Informed written consent was obtained from the patient's husband after a thorough discussion of the procedural risks, benefits and alternatives. All questions were addressed. Maximal Sterile Barrier Technique was utilized including caps, mask, sterile gowns, sterile gloves, sterile drape, hand hygiene and skin antiseptic. A timeout was performed prior to the  initiation of the procedure. Using a micropuncture kit and the modified Seldinger technique, access was gained to the right common femoral artery and an 8 French sheath was placed. Then, an [place] balloon guide catheter was navigated over a 6 [place] Berenstein 2 catheter and a 0.035 inch Terumo Glidewire into the aortic arch under fluoroscopic guidance. The catheter was placed into the right common carotid artery and then advanced into the right internal carotid artery. Frontal and lateral angiograms of the head were obtained. FINDINGS: Right ICA occlusion just distal to the origin of the anterior choroidal artery. PROCEDURE: Under biplane roadmap, a large bore aspiration catheter was navigated over a phenom 21 microcatheter and a synchro support microguidewire into the cavernous segment of the right ICA. The microcatheter was then navigated over the wire into the right M1/MCA. Then, a 6 x 40 mm solitaire stent retriever was deployed spanning the supraclinoid right ICA and M1 segment. The device was allowed to intercalated with the clot for 4 minutes. The microcatheter was removed. The aspiration catheter was advanced to the level of occlusion and connected to a penumbra aspiration pump. The guiding catheter balloon was inflated. The thrombectomy device and aspiration catheter were removed under constant aspiration. Right ICA angiogram showed persistent left ICA terminus occlusion. Under biplane roadmap, a large bore aspiration catheter was navigated over a phenom 21 microcatheter and a synchro support microguidewire into the cavernous segment of the right ICA. The microcatheter  was then navigated over the wire into the right M1/MCA. Then, a 5 x 37 mm embotrap stent retriever was deployed spanning the supraclinoid right ICA and M1 segment. The device was allowed to intercalated with the clot for 4 minutes. The microcatheter was removed. The aspiration catheter was advanced to the level of occlusion and  connected to a penumbra aspiration pump. The guiding catheter balloon was inflated. The thrombectomy device and aspiration catheter were removed under constant aspiration. Right ICA angiogram showed recanalization of the ICA terminus and A1 segment with persistent occlusion of the M1 segment. Embolus to the right pericallosal artery (A3 segment) was noted. Under biplane roadmap, a large bore aspiration catheter was navigated over a phenom 21 microcatheter and a synchro support microguidewire into the cavernous segment of the right ICA. The microcatheter was then navigated over the wire into the right M1/MCA. Then, a 5 x 37 mm embotrap stent retriever was deployed spanning the supraclinoid right ICA and M1 segment. The device was allowed to intercalated with the clot for 4 minutes. The microcatheter was removed. The aspiration catheter was advanced to the level of occlusion and connected to a penumbra aspiration pump. The guiding catheter balloon was inflated. The thrombectomy device and aspiration catheter were removed under constant aspiration. Follow-up right ICA angiograms showed complete recanalization of the right MCA vascular tree with migration of the A3 segment embolus to the A4 segment. Under biplane roadmap, a large bore aspiration catheter was navigated over a 3 max aspiration catheter and a synchro support microguidewire into the cavernous segment of the right ICA. The 3 max was then navigated over the wire into the pericallosal artery A4 segment at the level of occlusionand connected to a penumbra aspiration pump. The guiding catheter balloon was inflated. After 5 minutes of continuous aspiration, the 3 max was retracted. Follow-up right ICA angiogram showed complete recanalization of the right ACA vascular tree with persistent recanalization of the MCA vascular tree (TICI 3). Flat panel CT of the head was obtained and post processed in a separate workstation with concurrent attending physician  supervision. Selected images were sent to PACS. Mild swelling and contrast staining of the right MCA territory cortical ribbon, basal ganglia and amygdala was noted with partial effacement of the right lateral ventricle without midline shift. No subarachnoid hemorrhage seen. Right common femoral artery angiograms with right anterior oblique and lateral views showed access at the level of the common femoral artery which has normal caliber, adequate for for device utilization. The 8 French sheath was exchanged over the wire for a Perclose ProGlide which was utilized for access closure. Immediate hemostasis was achieved. IMPRESSION: 1. Successful mechanical thrombectomy/aspiration of a right ICA terminus "T" occlusion. A total of 3 stent retriever with combined aspiration passes and 1 direct contact aspiration performed for a complete (TICI3) recanalization. 2. No embolus to new territory. 3. No hemorrhagic complication on postprocedural flat panel CT. Although, early ischemic changes related to ongoing infarct with cortical and basal ganglia swelling and contrast staining are noted on postprocedural flat panel CT with partial effacement of the right lateral ventricle. PLAN: Transferred to ICU for close monitoring and swell watch. Electronically Signed   By: Pedro Earls M.D.   On: 11/03/2019 14:44   CT HEAD CODE STROKE WO CONTRAST  Result Date: 10/31/2019 CLINICAL DATA:  Code stroke. Acute onset of left-sided facial droop and abnormal gaze. EXAM: CT HEAD WITHOUT CONTRAST TECHNIQUE: Contiguous axial images were obtained from the base of the skull  through the vertex without intravenous contrast. COMPARISON:  None. FINDINGS: Brain: A large right MCA infarct is noted. There is hypoattenuation involving the right caudate head, internal capsule, and lentiform nucleus. Hypoattenuation is seen at the insular ribbon, right superior temporal gyrus, and super ganglionic posterior right frontal lobe. No acute  hemorrhage is present. No mass effect scratched at there is some partial effacement of the sulci. No midline shift is present. Basal ganglia are normal on the left. The brainstem and cerebellum are within normal limits. The ventricles are of normal size. No significant extraaxial fluid collection is present. Vascular: A hyperdense right MCA and ICA terminus is noted. Hyperdensity extends beyond the right MCA bifurcation. The left MCA is unremarkable. Atherosclerotic calcifications are present within the cavernous internal carotid arteries bilaterally. Skull: 1 Calvarium is intact. No focal lytic or blastic lesions are present. No significant extracranial soft tissue lesion is present. Sinuses/Orbits: The paranasal sinuses and mastoid air cells are clear. The globes and orbits are within normal limits. ASPECTS [facility] Stroke Program Early CT Score) - Ganglionic level infarction (caudate, lentiform nuclei, internal capsule, insula, M1-M3 cortex): 2/7 - Supraganglionic infarction (M4-M6 cortex): 2/3 Total score (0-10 with 10 being normal): 4/10 IMPRESSION: 1. Large right MCA territory nonhemorrhagic infarct is described. 2. Hyperdense right MCA suggesting extensive thrombus from the right ICA terminus through the MCA bifurcation 3. Some mass effect with partial effacement the sulci on the right. No midline shift. 4. ASPECTS is 4/10 The above was relayed via text pager to Dr. Roland Rack on 10/31/2019 at 17:59 . Electronically Signed   By: San Morelle M.D.   On: 10/31/2019 18:03   [place] EKG SITE RITE  Result Date: 11/01/2019 If Site Rite image not attached, placement could not be confirmed due to current cardiac rhythm.      HISTORY OF PRESENT ILLNESS Krystal Little is an 64 y.o. female with PMH HTN, DM2, chronic A Fib (on coumadin) s/p pacemaker who presented to [facility] ED as a code stroke for right gaze deviation and left side weakness. Per EMS, daughter was at the  mother's house visiting. When the daughter left her mother at 33 she was completely normal. At 1500 when patient's husband returned he found his wife on the floor. She had been incontinent of urine. CTH: no hemorrhage; ASPECTS 4. CTA: M1 occlusion. BP: 143/76 BG: 208. tPA not given as on coumadin. Modified Rankin Score=0. NIHSS:14.   HOSPITAL COURSE Ms. AYANE DELANCEY is a 64 y.o. female with history of HTN, DM2, hx of mitral valve repair, chronic a. Fib (coumadin - INR 1.2 on admission)s/p pacemakerwho presented to [facility] ED as a code stroke for right gaze deviation, urinary incontinence and left sided weakness. She did not receive IV t-PA due to anticoagulation with coumadin. Found to have a R M1 occlusion. Taken to IR with mechanical thrombectomy performed with 2 stent retriever + aspiration passes in the MCA-ICA (solitaire and embotrap), 1 stent retriever + aspiration pass MCA (embotrap) and 1 direct contact aspiration in the right ACA. Complete recanalization achieved (TICI3). Admitted to neuro ICU where she had neuro worsening with increased petechial hemorrhage. Taken for hemicraniectomy by neurosurgeon. Progress slow with lethargy. To CIR for therapy.   Stroke: Large right MCA infarct due to right ICA occlusion s/p IR with TICI3 reperfusion and hemorrhagic transformation, SAH. Infarct etiology likely embolic secondary to atrial fibrillation on warfarin with subtherapeutic INR  CT Head -  Large  R MCA infarct. Hyperdense R MCA w/ thrombus from R ICA terminus through the MCA bifurcation Some mass effect with partial effacement the sulci on the right. ASPECTS is 4/10   CTA H&N - Total occlusion of R ICA terminus. Thrombus extends through the right MCA bifurcation with poor collaterals.   CT Perfusion - Confirmed large R MCA territory infarct with core volume of 107 mL. Large mismatch volume of 9.6 with a mismatch ratio of 1.9.   CT head -  Large area of  hypoattenuation within the R MCA territory. Multifocal hyperdensity within the R MCA territory c/w hemorrhage or contrast staining. The more peripheral hyperdensities are favored to be hemorrhagic. 6 mm leftward midline shift  CT Head 11/02/19 - Worsened edema within the R MCA territory with increased size of R basal ganglia intraparenchymal hematoma. Increased amount of subarachnoid blood over the R hemisphere. 11 mm of leftward midline shift  MRI head - not performed - PPM   CT head 4/20 postop changed from R crani. Evolution large R MCA infarct w/ interval increase edema/swelling. Increased petechial hemorrhage throughout. SAH also increased. R BG HT slightly increased. Mass effect, partial effacement ventricles w/ 5m L midline shift   CT 4/22 stable large R MCA infarct w/ diffuse petechial hemorrhage   CT Head 11/09/19 - Continued interval evolution of large R  MCA territory infarct with associated hemorrhagic transformation, relatively stable as compared to 11/06/2019. Mass effect w/ 12 mm of right-to-left shift stable.. Stable asymmetric dilatation of the left lateral ventricle.  CT head - 11/16/19 - Decreased R hemispheric edema and decreased leftward midline shift.  2D Echo - EF 50-55%. No source of embolus. 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   Sars Corona Virus 2 - negative  LDL - 158  HgbA1c - 9.7  UDS - negative  VTE prophylaxis - SCDs  warfarin daily prior to admission but INR subtherapeutic at 1.2 on admission, now on ASA 3297m Consider resume AC around 5/7 after CT repeat 5/7 am.  Therapy recommendations:  CIR  Disposition:  CIR  Acute hypoxemia respiratory failure d/t stroke  Intubated for emergent hemicraniectomy for cerebral edema  Now extubated  CXR 4/20 - Complete opacification of the retrocardiac left lung base worrisome for left lower lobe atelectasis.  CXR 4/22 B pulm infiltrates / edema and B pleural effusions  CXR 4/23 - Bilateral  pleural effusions, right greater than left, with associated opacity, likely atelectasis.  CXR 11/09/19 - Stable small right effusion with underlying atelectasis. Stableeft retrocardiac opacity. The small layering left effusion seen previously is less prominent.  CXR 4/27: pleural effusions. Pt tachyonic, but no acute distress  Lasix 40 IV given x1  CXR 11/15/19 - Persistent bilateral pleural effusions, left greater than right. Persistent retrocardiac opacity favored to represent atelectasis.  Chest PT and 3% saline nebs PRN  Cerebral edema w/ subfalcine herniation s/p decompressive hemicraniectomy  [facility]  [facility] 11/16/19 - Decreased right hemispheric edema and decreased leftward midline shift.  3% saline @ 50 cc/hr -> NS @ 50 -> off  PICC line placed  23.4% x 2  Na normalized  Atrial fibrillation w/ RVR  Telemetry showed A. fib, rate controlled  On Coumadin PTA, family stated compliance  Subtherapeutic INR 1.2 on presentation -> 1.4  No AC at the time due to large infarct and hemorrhagic conversion (previously refused DOACs)  metoprolol 50 bid -> 25 bid  On cardizem 60 q 8h->60 q 6h  amiodarone gtt  converted to po 4/22 - 400 bid x 1 week then 400 daily -> now discontinued  EP Cardiology on board  HR stable, on the low side - decrease metoprolol to 25 bid  On aspirin 325, will consider resume AC around 5/7  Repeat CT head 5/7 am  Hypertension->Hypotension  Home BP meds: Atenolol; Cardizem ; lisinopril  Current BP meds: cardizem, metoprolol 50 bid  Off cleviprex  lisinopril d/c due to low BP  BP improved and stable  On metoprolol 25 bid -> 50 bid -> 25 bid  SBP goal < 160 mm Hg  Long-term BP goal normotensive  Hyperlipidemia  Home Lipid lowering medication: zetia  LDL 158, goal < 70  Now on lipitor 40  Continue statin at discharge  Diabetes, uncontrolled  Home diabetic meds: metformin  HgbA1c 9.7, goal < 7.0  CBG  monitoring  Hyperglycemia  on lantus 20 bid, novolog 8u nocturnal TF coverage     Continue SSI 0-9  DM coordinator consulted  Dysphagia Malnutrition  Has cortrak, start on nocturnal feeding  Insulin adjusted for nocturnal feeding  Cleared for D1 nectar diet  Dietitian consult for caloric count - intake trending down - continue TF w/ prostat and magic sup   Speech following  Other Stroke Risk Factors  Advanced age  ETOH use, advised to drink no more than 1 alcoholic beverage per day.  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 rate 19, height _0  (1.626 m), weight 66 kg, SpO2 98 %. General - Well nourished, well developed, lethargic but eyes open.  Ophthalmologic - fundi not visualized due to noncooperation.  Cardiovascular - irregularly irregular heart rate and rhythm.  Neuro - lethargic with eyes open, following simple commands on the right, able to say short sentences and answer questions with brief answer, able to repeat and name, but moderate dysarthria. Left lower quadrantanopia. Right gaze preference, barely cross midline. Left facial droop, left UE 0/5 flaccid LLE slight withdraw to pain. RUE proximal 3/5 and distal 4/5 and RLE 3/5 proximal and 4/5 distally. Left side diminished muscle tone. Negative babinski. Sensation, coordination not cooperative and gait not tested.  Discharge Diet  Dysphagia I nectar thick liquids  DISCHARGE PLAN  Disposition:  Transfer to [place] for ongoing PT, OT and ST  aspirin 325 mg daily for secondary stroke prevention  Repeat CT head 5/7 am. Consider AC at that time if hemorrhage resolving. Call Stroke team for direction if  needed.  Recommend ongoing stroke risk factor control by Primary Care Physician at time of discharge from inpatient rehabilitation.  Follow-up PCP Stark Klein, MD in 2 weeks following discharge from rehab.  Follow-up in [facility] in 4 weeks following discharge from rehab, office to schedule an appointment.   40 minutes were spent preparing discharge.  Rosalin Hawking, MD PhD Stroke Neurology 11/20/2019 6:16 PM

## 2019-11-20 NOTE — Progress Notes (Signed)
Nutrition Follow-up  DOCUMENTATION CODES:   Not applicable  INTERVENTION:   Vital Cuisine shakes po TID, each supplement provides 500 kcal and 22 grams protein  Continue feeding assistance with meals  Continue MVI daily  Continue 14ml Pro-stat BID po, each supplement provides 100 kcal and 15 grams protein  Continue Magic cup TID with meals, each supplement provides 290 kcal and 9 grams of protein  Adjust nocturnal TF:   Glucerna 1.5 @ 79ml/hr via Cortrak over 8 hour period (2000-0400)   Tube feeding regimen provides 600 kcal (40% of needs), 33 grams protein, 320ml free water  If pt's po intake continues to improve over next couple of days, may d/c Cortrak and nocturnal feeding.    NUTRITION DIAGNOSIS:   Inadequate oral intake related to inability to eat as evidenced by NPO status.  Progressing; advanced to dysphagia 1 diet with nectar thick liquids, transitioned to nocturnal TF  GOAL:   Patient will meet greater than or equal to 90% of their needs  Progressing.  MONITOR:   TF tolerance, Diet advancement, Labs  REASON FOR ASSESSMENT:   Consult Calorie Count  ASSESSMENT:   Pt with PMH of DM, chronic AF with pacemaker found down with R MCA stroke s/p IR thrombectomy.  4/18 s/p emergent decompressive hemicraniectomy due to vasogenic cerebral edema 4/25 extubated 4/30 Cortrak (tip in stomach); MBS, recommending D1/Nectars 5/1- MD transitioned to nocturnal feedings, requesting calorie count, PEG placement cancelled  Pt to transfer to CIR.   Pt reports appetite is pretty good and that she is eating ~70% of meals (documented as less). Discussed importance of adequate po intake with pt in order to remove Cortrak. Pt agreeable to use of supplements.   PO Intake: 10-50% documented x last 8 meals (40% average meal intake)  Current tube feeding order: Glucerna 1.5 @ 66ml/hr from 2000-0600, 59ml Pro-stat BID  RD will adjust nocturnal tube feeding to provide less  calories/protein as pt's po intake appears to be improving again. If po intake remains improved, may d/c Cortrak and tube feeding in a couple of days.   UOP: 767ml x24 hours I/O: +8,802.13ml since admit  Labs: CBGs [phone] Medications reviewed and include: Novolog, Lantus, MVI, Miralax, Senokot-S  Diet Order:   Diet Order            DIET - DYS 1 Room service appropriate? Yes; Fluid consistency: Nectar Thick  Diet effective now              EDUCATION NEEDS:   No education needs have been identified at this time  Skin:  Skin Assessment: Skin Integrity Issues: Skin Integrity Issues:: Incisions, Other (Comment) Incisions: pretibial venous stasis ulcer Other: incision groin, head, abdomen  Last BM:  5/5 type 6  Height:   Ht Readings from Last 1 Encounters:  10/31/19 5\' 4"  (1.626 m)    Weight:   Wt Readings from Last 1 Encounters:  11/20/19 66 kg    BMI:  Body mass index is 24.98 kg/m.  Estimated Nutritional Needs:   Kcal:  1500-1700  Protein:  76-85 grams  Fluid:  >1.6 L/day    Larkin Ina, MS, RD, LDN RD pager number and weekend/on-call pager number located in [place].

## 2019-11-20 NOTE — Progress Notes (Signed)
Orthopedic Tech Progress Note Patient Details:  Krystal Little October 12, 1955 [id number] Called in order to [employer] for a SAFETY HELMET  Patient ID: Krystal Little, female   DOB: 1955/08/04, 64 y.o.   MRN: [id number]   Janit Pagan 11/20/2019, 6:51 PM

## 2019-11-20 NOTE — H&P (Signed)
Physical Medicine and Rehabilitation Admission H&P    No chief complaint on file. : HPI: Krystal Little is a 64 year old right-handed female with history of hypertension, diabetes mellitus, chronic atrial fibrillation on Coumadin therapy as well as pacemaker and mitral valve repair.  Per chart review lives with spouse.  1 level home 2 steps to entry.  Independent and active prior to admission.  Presented 10/31/2019 left-sided weakness neglect and right gaze deviation.  Admission chemistries with hemoglobin 14.3, glucose 214, INR 1.2, urine drug screen negative.  Cranial CT scan as well as CT angiogram head and neck showed a large right MCA territory nonhemorrhagic infarction.  Hyperdense right MCA suggesting extensive thrombus from the right ICA terminus to the MCA bifurcation.  Echocardiogram with ejection fraction of 55%.  Patient underwent endovascular thrombectomy revascularization per interventional radiology.  Follow-up imaging showed worsening edema involving the right middle cerebral artery causing significant radiologic mass-effect and patient underwent decompressive right hemicraniectomy placement of bone flap into abdominal subcutaneous pocket 11/02/2019 per Dr. Kathyrn Sheriff.  Patient was cleared to begin subcutaneous Lovenox for DVT prophylaxis 11/09/2019.  CT scan follow-up 11/16/2019 decreased right hemispheric edema and decreased left midline shift.  Initially maintained on Cleviprex for blood pressure control.  Follow cardiology service for atrial fibrillation maintained on Cardizem as well as Lopressor and her chronic Coumadin has been discontinued at this present time with aspirin 325 mg initiated 11/17/2019 and consideration is being made to resuming anticoagulation 11/21/2019 based on CT results..  Acute blood loss anemia 9.7 and monitored.  Currently maintained on a dysphagia #1 nectar thick liquid diet with calorie counts and nasogastric tube have been in place for nutritional support.   Therapy evaluations completed and patient was admitted for a comprehensive rehab program.   Husband was at bedside-doesn't know when LBM was.    Review of Systems  Unable to perform ROS: Acuity of condition   Past Medical History:  Diagnosis Date  . Chronic atrial fibrillation ([place]) 1990s   s/p DCCV then attempted ablation of complex A Flutter (@ [facility] - Dr. Deno Etienne), failed antiarrhythmics --> INR folllwed @ [facility] ->> now status post pacemaker placement with underlying A. fib.  . Diabetes mellitus type 2, uncontrolled, without complications    On Oral Medications ([place] [facility])  . Essential hypertension   . History of cardiac catheterization 2001   R&LHC - normal Coronaries, no evidence of Restictive Cardiomyopathy or Constrictive Pericarditis (also @ [facility])  . Hx of sick sinus syndrome 07/1999   Wtih symptomatic bradycardia - syncope (Tachy-Brady)  . 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 CARDIAC CATHETERIZATION  08/03/1999   [place] - normal Coronaries; no sign of constriction or restrictive cardiomypathy  . CRANIECTOMY FOR DEPRESSED SKULL FRACTURE Right 11/02/2019   Procedure: DECOMPRESSIVE HEMI -CRANIECTOMY, IMPLANTATION OF SKULL FLAP TO RIGHT ABDOMEN;  Surgeon: Consuella Lose, MD;  Location: [place];  Service: Neurosurgery;  Laterality: Right;  . IR CT HEAD LTD  10/31/2019  . IR PERCUTANEOUS ART THROMBECTOMY/INFUSION INTRACRANIAL INC DIAG ANGIO  10/31/2019      .  IR PERCUTANEOUS ART THROMBECTOMY/INFUSION INTRACRANIAL INC DIAG ANGIO  10/31/2019  . MITRAL VALVE REPAIR   07/1999   Both Ant& Post leaflet repair - quadrangular resection&caudal transposition, #32 Sequin Annuloplasty ring  . PACEMAKER GENERATOR CHANGE  11/26/2008   Medtronic Adapta L(? if it has been checked since 02/2013)  . PACEMAKER INSERTION  07/1999   for SSS in setting of Chronic Afib; Medtronic Kappa [id number], [id number] H.  Marland Kitchen RADIOLOGY WITH ANESTHESIA N/A 10/31/2019   Procedure: IR WITH ANESTHESIA;  Surgeon: Radiologist, Medication, MD;  Location: [place];  Service: Radiology;  Laterality: N/A;  . TRANSTHORACIC ECHOCARDIOGRAM  05/2018   Normal LV size and thickness.  EF estimated 50%.  Inferobasal HK and flattened septum c/w with elevated PA pressures. Mild functional mitral stenosis at rest (postoperative).  Moderate left atrial dilation and mild right atrial dilation.  Mean PA pressures estimated 52 mmHg.   Family History  Problem Relation Age of Onset  . Hypertension Mother   . Diabetes Sister   . Cancer Brother        Rectal  . Lung cancer Brother    Social History:  reports that she has never smoked. She has never used smokeless tobacco. She reports current alcohol use. She reports that she does not use drugs. Allergies: No Known Allergies Medications Prior to Admission  Medication Sig Dispense Refill  . acetaminophen (TYLENOL) 500 MG tablet Take 500-1,000 mg by mouth every 6 (six) hours as needed for headache (pain).    Marland Kitchen amantadine (SYMMETREL) 50 MG/5ML solution Place 10 mLs (100 mg total) into feeding tube 2 (two) times daily. 140 mL 0  . Amino Acids-Protein Hydrolys (FEEDING SUPPLEMENT, PRO-STAT SUGAR FREE 64,) LIQD Take 30 mLs by mouth 2 (two) times daily. 887 mL 0  . [START ON 11/21/2019] aspirin 325 MG tablet Place 1 tablet (325 mg total) into feeding tube daily.    Derrill Memo ON 11/21/2019] atorvastatin (LIPITOR) 40 MG tablet Place 1 tablet (40 mg total) into feeding tube daily.    Derrill Memo ON 11/21/2019] Chlorhexidine Gluconate Cloth 2 % PADS Apply 6 each topically daily at 6 (six) AM.     . chlorhexidine gluconate, MEDLINE KIT, (PERIDEX) 0.12 % solution 15 mLs by Mouth Rinse route 2 (two) times daily. 120 mL 0  . diltiazem (CARDIZEM) 10 mg/ml oral suspension Place 6 mLs (60 mg total) into feeding tube every 6 (six) hours.    . enoxaparin (LOVENOX) 40 MG/0.4ML injection Inject 0.4 mLs (40 mg total) into the skin daily. 0 mL   . insulin aspart (NOVOLOG) 100 UNIT/ML injection Inject 0-9 Units into the skin 3 (three) times daily with meals. 10 mL 11  . insulin aspart (NOVOLOG) 100 UNIT/ML injection Inject 8 Units into the skin 2 (two) times daily. 10 mL 11  . insulin glargine (LANTUS) 100 UNIT/ML injection Inject 0.2 mLs (20 Units total) into the skin 2 (two) times daily. 10 mL 11  . Maltodextrin-Xanthan Gum (RESOURCE THICKENUP CLEAR) POWD Take 120 g by mouth as needed (nectar thick liquids).    . metoprolol tartrate (LOPRESSOR) 25 MG tablet Place 1 tablet (25 mg total) into feeding tube 2 (two) times daily.    . Nutritional Supplements (FEEDING SUPPLEMENT, GLUCERNA 1.5 CAL,) LIQD Place 500 mLs into feeding tube daily.    . polyethylene glycol (MIRALAX / GLYCOLAX) 17 g packet Place 17 g into feeding tube 2 (two) times daily. 14 each 0  . senna-docusate (SENOKOT-S) 8.6-50 MG tablet Place 1 tablet into feeding tube  at bedtime as needed for mild constipation.    . senna-docusate (SENOKOT-S) 8.6-50 MG tablet Place 1 tablet into feeding tube 2 (two) times daily.      Drug Regimen Review Drug regimen was reviewed and remains appropriate with no significant issues identified  Home: Home Living Family/patient expects to be discharged to:: Private residence Living Arrangements: Spouse/significant other, Children, Other relatives   Functional History:    Functional Status:  Mobility:          ADL:    Cognition: Cognition Orientation Level: Oriented X4    Physical Exam: Blood pressure (!) 144/71, pulse 62, temperature 98 F (36.7 C), resp. rate 17, height '5\' 4"'  (1.626  m), weight 66.7 kg, SpO2 99 %. Physical Exam  Nursing note and vitals reviewed. Constitutional:  Asleep for most of exam- would participate, but kept eyes closed 95% of time and then barely opened. Husband at side for part of exam, otherwise, elderly female with L arm propped on pillow, NAD  HENT:  Craniotomy site with staples intact On R side- C shaped- no drainage or erythema- mild depression in skull Has cortrak in place L facial droop at rest- worse with smiling L tongue deviation  Eyes: Right eye exhibits no discharge. Left eye exhibits no discharge.  Opened eyes to half mast x 10-15 seconds- no more- pupils small, but reactive to light somewhat- did not appear to have conjugate gaze on short peek-   Neck: No tracheal deviation present.  Cardiovascular:  RRR- no JVD  Respiratory: No stridor.  CTA B/L  good air movement at rest  GI:  Soft except bone flap in RLQ- staples intact- look good NT, ND, (+) hypoactive BS  Musculoskeletal:     Cervical back: Normal range of motion and neck supple.     Comments: R side, which is supposedly unaffected, pt was able to squeeze R hands, do Wrist extension and DF and PF at least 4/5 LUE/LLE- when asked to move, 0/5 however could have been due to not following all commands L hand slightly swollen  Neurological:  Patient is sitting up at edge of bed with therapies at bedside.  Patient is alert.  She provides her name and place.  Mood is flat.  Follows simple commands.  Pt very sedated (had been up and in chair for 1+ hrs prior) But following ~ 60+% of 1 step simple commands Decreased sensation to light touch in LUE and LLE   Skin:  As above with crani and bone flap- not able to assess backside since supine/sedated  Psychiatric:  Flat, sedated    Results for orders placed or performed during the hospital encounter of 10/31/19 (from the past 48 hour(s))  Glucose, capillary     Status: Abnormal   Collection Time: 11/18/19  8:24 PM  Result  Value Ref Range   Glucose-Capillary 145 (H) 70 - 99 mg/dL    Comment: Glucose reference range applies only to samples taken after fasting for at least 8 hours.   Comment 1 Notify RN    Comment 2 Document in Chart   Glucose, capillary     Status: None   Collection Time: 11/18/19 11:38 PM  Result Value Ref Range   Glucose-Capillary 85 70 - 99 mg/dL    Comment: Glucose reference range applies only to samples taken after fasting for at least 8 hours.   Comment 1 Notify RN    Comment 2 Document in Chart   Glucose, capillary     Status:  Abnormal   Collection Time: 11/19/19  3:45 AM  Result Value Ref Range   Glucose-Capillary 132 (H) 70 - 99 mg/dL    Comment: Glucose reference range applies only to samples taken after fasting for at least 8 hours.   Comment 1 Notify RN    Comment 2 Document in Chart   CBC     Status: Abnormal   Collection Time: 11/19/19  5:00 AM  Result Value Ref Range   WBC 9.1 4.0 - 10.5 K/uL   RBC 3.14 (L) 3.87 - 5.11 MIL/uL   Hemoglobin 9.7 (L) 12.0 - 15.0 g/dL   HCT 30.7 (L) 36.0 - 46.0 %   MCV 97.8 80.0 - 100.0 fL   MCH 30.9 26.0 - 34.0 pg   MCHC 31.6 30.0 - 36.0 g/dL   RDW 16.2 (H) 11.5 - 15.5 %   Platelets 390 150 - 400 K/uL   nRBC 0.0 0.0 - 0.2 %    Comment: Performed at [facility] [address], [place], [place] [address]  Basic metabolic panel     Status: Abnormal   Collection Time: 11/19/19  5:00 AM  Result Value Ref Range   Sodium 136 135 - 145 mmol/L   Potassium 4.0 3.5 - 5.1 mmol/L   Chloride 99 98 - 111 mmol/L   CO2 26 22 - 32 mmol/L   Glucose, Bld 150 (H) 70 - 99 mg/dL    Comment: Glucose reference range applies only to samples taken after fasting for at least 8 hours.   BUN 9 8 - 23 mg/dL   Creatinine, Ser 0.65 0.44 - 1.00 mg/dL   Calcium 8.5 (L) 8.9 - 10.3 mg/dL   GFR calc non Af Amer >60 >60 mL/min   GFR calc Af Amer >60 >60 mL/min   Anion gap 11 5 - 15    Comment: Performed at [facility] [address]  [place], [place] [address]  Glucose, capillary     Status: Abnormal   Collection Time: 11/19/19  8:54 AM  Result Value Ref Range   Glucose-Capillary 121 (H) 70 - 99 mg/dL    Comment: Glucose reference range applies only to samples taken after fasting for at least 8 hours.   Comment 1 Notify RN    Comment 2 Document in Chart   Glucose, capillary     Status: Abnormal   Collection Time: 11/19/19 12:07 PM  Result Value Ref Range   Glucose-Capillary 129 (H) 70 - 99 mg/dL    Comment: Glucose reference range applies only to samples taken after fasting for at least 8 hours.  Glucose, capillary     Status: Abnormal   Collection Time: 11/19/19  4:15 PM  Result Value Ref Range   Glucose-Capillary 219 (H) 70 - 99 mg/dL    Comment: Glucose reference range applies only to samples taken after fasting for at least 8 hours.   Comment 1 Notify RN    Comment 2 Document in Chart   Glucose, capillary     Status: Abnormal   Collection Time: 11/19/19  8:07 PM  Result Value Ref Range   Glucose-Capillary 261 (H) 70 - 99 mg/dL    Comment: Glucose reference range applies only to samples taken after fasting for at least 8 hours.  Glucose, capillary     Status: Abnormal   Collection Time: 11/20/19 12:16 AM  Result Value Ref Range   Glucose-Capillary 207 (H) 70 - 99 mg/dL    Comment: Glucose reference range  applies only to samples taken after fasting for at least 8 hours.  Glucose, capillary     Status: Abnormal   Collection Time: 11/20/19  3:59 AM  Result Value Ref Range   Glucose-Capillary 136 (H) 70 - 99 mg/dL    Comment: Glucose reference range applies only to samples taken after fasting for at least 8 hours.  Glucose, capillary     Status: Abnormal   Collection Time: 11/20/19  6:19 AM  Result Value Ref Range   Glucose-Capillary 118 (H) 70 - 99 mg/dL    Comment: Glucose reference range applies only to samples taken after fasting for at least 8 hours.  Glucose, capillary     Status: Abnormal   Collection  Time: 11/20/19  7:58 AM  Result Value Ref Range   Glucose-Capillary 114 (H) 70 - 99 mg/dL    Comment: Glucose reference range applies only to samples taken after fasting for at least 8 hours.   Comment 1 Notify RN    Comment 2 Document in Chart   Glucose, capillary     Status: Abnormal   Collection Time: 11/20/19 11:25 AM  Result Value Ref Range   Glucose-Capillary 189 (H) 70 - 99 mg/dL    Comment: Glucose reference range applies only to samples taken after fasting for at least 8 hours.   Comment 1 Notify RN    Comment 2 Document in Chart   Glucose, capillary     Status: Abnormal   Collection Time: 11/20/19  4:30 PM  Result Value Ref Range   Glucose-Capillary 246 (H) 70 - 99 mg/dL    Comment: Glucose reference range applies only to samples taken after fasting for at least 8 hours.  Glucose, capillary     Status: Abnormal   Collection Time: 11/20/19  4:45 PM  Result Value Ref Range   Glucose-Capillary 237 (H) 70 - 99 mg/dL    Comment: Glucose reference range applies only to samples taken after fasting for at least 8 hours.   No results found.     Medical Problem List and Plan: 1.  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  Order placed for safety helmet  -patient may  Shower if crani covered, or staples removed  -ELOS/Goals: 3.5 to 4 weeks- min-supervision 2.  Antithrombotics: -DVT/anticoagulation: Lovenox initiated 11/09/2019  -antiplatelet therapy: Aspirin 325 mg daily initiated 11/17/2019 3. Pain Management: Tylenol as needed 4. Mood: Amantadine 100 mg twice daily  -antipsychotic agents: N/A 5. Neuropsych: This patient is not capable of making decisions on her own behalf. 6. Skin/Wound Care:  Routine skin checks 7. Fluids/Electrolytes/Nutrition: Routine in and outs with follow-up chemistries 8.  Atrial fibrillation/pacemaker.  Chronic Coumadin currently on hold.PLAN TO RESUME ANTICOAGULATION 11/21/2019.   Cardiac rate controlled.  Continue Cardizem 60 mg every 6 hours as well as Lopressor 25 mg twice daily.  Follow-up cardiology services as needed 9.  Acute blood loss anemia.  Follow-up CBC 10.  Diabetes mellitus.  Hemoglobin A1c 9.7.  NovoLog 8 units twice daily, Lantus insulin 20 units twice daily.  Check blood sugars before meals and at bedtime. 11.  Dysphagia.  Dysphagia #1 nectar liquids.  Follow-up speech therapy .Nocturnal tube feeds if needed- per Neurology, continuing Night time TFs since poor intake- husband says eating well? 12.  Hyperlipidemia.  Lipitor  Lavon Paganini. [place], PA-C 11/20/2019   I have personally performed a face to face diagnostic evaluation of this patient and formulated the key components of the plan.  Additionally, I have personally reviewed laboratory data, imaging studies, as well as relevant notes and concur with the physician assistant's documentation above.   The patient's status has not changed from the original H&P.  Any changes in documentation from the acute care chart have been noted above.     Courtney Heys, MD 11/20/2019

## 2019-11-20 NOTE — Progress Notes (Signed)
Occupational Therapy Treatment Patient Details Name: Krystal Little MRN: [id number] DOB: 04-06-1956 Today's Date: 11/20/2019    History of present illness Pt is 64 y.o. female presented to [facility] ED as a code stroke for  Right gaze deviation and left side weakness. 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 Pt s/p R hemicraniectomy with bone flap placed in abdomen on 4/18.  PMH HTN, DM2, chronic a. Fib (coumadin) s/p pace maker.   OT comments  Patient continues to make steady progress towards goals in skilled OT session. Patient's session encompassed co-treat with PT to further address functional deficits. Pt verbalized need to use commode once at EOB, therefore stedy used to transfer to [facility]. Pt continues to require cues for cervical extension and assist in ranging (signifcant L gaze noted) with ice applied at end of session. Pt with increased ability to power up to standing with cues for hand placement on R, however unable to complete peri-care due to current functional deficits. Discharge plan remains appropriate; will continue to follow acutely.    Follow Up Recommendations  CIR;Supervision/Assistance - 24 hour    Equipment Recommendations  Other (comment)    Recommendations for Other Services      Precautions / Restrictions Precautions Precautions: Fall Precaution Comments: R hemicraniectomy- skull in R abdomen, L shoulder subluxation Restrictions Weight Bearing Restrictions: No       Mobility Bed Mobility Overal bed mobility: Needs Assistance Bed Mobility: Rolling;Sidelying to Sit Rolling: Mod assist Sidelying to sit: Max assist;+2 for safety/equipment       General bed mobility comments: cues for sequencing; pt able to roll to L side with mod A and max A to roll to R side; pt using rail with R UE; assist to bring bilat LE to EOB and to elevate trunk into sitting   Transfers Overall transfer level: Needs assistance   Transfers: Sit to/from  Stand Sit to Stand: Mod assist;+2 physical assistance;Max assist;+2 safety/equipment         General transfer comment: pt transferred bed to [facility] using Stedy standing frame; pt able to stand multiple times for transfer and peri care; mod A +2-max A +2 required due to L lateral lean, L UE support, and difficulty with cervical extension    Balance Overall balance assessment: Needs assistance Sitting-balance support: Single extremity supported;Feet supported Sitting balance-Leahy Scale: Poor Sitting balance - Comments: pt able to maintain balance with mod A; assist for cervical extension  Postural control: Left lateral lean Standing balance support: Bilateral upper extremity supported;During functional activity Standing balance-Leahy Scale: Poor Standing balance comment: increased ability to power up to standing, but demonstrates significant L lateral lean                           ADL either performed or assessed with clinical judgement   ADL Overall ADL's : Needs assistance/impaired                         Toilet Transfer: +2 for physical assistance;+2 for safety/equipment;Maximal assistance;BSC Toilet Transfer Details (indicate cue type and reason): Utilization of stedy to complete to [facility] Toileting- Clothing Manipulation and Hygiene: Total assistance;+2 for physical assistance;+2 for safety/equipment;Cueing for safety;Cueing for sequencing;Sit to/from stand Toileting - Clothing Manipulation Details (indicate cue type and reason): Total to complete peri care in stedy       General ADL Comments: Pt able to verbalize need to go  to the bathroom, but is unable to coordinate movement to complete peri-care, requires increased cues for R hand placement and to increase attention to L especially to protect L arm     Vision       Perception     Praxis      Cognition Arousal/Alertness: Awake/alert Behavior During Therapy: Flat affect Overall Cognitive Status:  Impaired/Different from baseline Area of Impairment: Attention;Following commands;Problem solving                   Current Attention Level: Sustained   Following Commands: Follows one step commands consistently;Follows one step commands with increased time Safety/Judgement: Decreased awareness of safety;Decreased awareness of deficits Awareness: Intellectual Problem Solving: Slow processing;Difficulty sequencing;Requires verbal cues General Comments: pt more alert, talkative, and keeping eyes open more during session        Exercises     Shoulder Instructions       General Comments      Pertinent Vitals/ Pain       Pain Assessment: Faces Faces Pain Scale: Hurts a little bit Pain Location: L shoulder  Pain Descriptors / Indicators: Discomfort;Guarding;Sore Pain Intervention(s): Limited activity within patient's tolerance;Monitored during session;Repositioned  Home Living                                          Prior Functioning/Environment              Frequency  Min 2X/week        Progress Toward Goals  OT Goals(current goals can now be found in the care plan section)  Progress towards OT goals: Progressing toward goals  Acute Rehab OT Goals Patient Stated Goal: to go to rehab OT Goal Formulation: With patient Time For Goal Achievement: 11/20/19 Potential to Achieve Goals: Good  Plan Discharge plan remains appropriate    Co-evaluation    PT/OT/SLP Co-Evaluation/Treatment: Yes Reason for Co-Treatment: Complexity of the patient's impairments (multi-system involvement);For patient/therapist safety;To address functional/ADL transfers PT goals addressed during session: Mobility/safety with mobility;Balance OT goals addressed during session: ADL's and self-care;Strengthening/ROM      AM-PAC OT "6 Clicks" Daily Activity     Outcome Measure   Help from another person eating meals?: A Lot Help from another person taking care of  personal grooming?: A Lot Help from another person toileting, which includes using toliet, bedpan, or urinal?: Total Help from another person bathing (including washing, rinsing, drying)?: A Lot Help from another person to put on and taking off regular upper body clothing?: Total Help from another person to put on and taking off regular lower body clothing?: Total 6 Click Score: 9    End of Session Equipment Utilized During Treatment: Other (comment)(Stedy)  OT Visit Diagnosis: Unsteadiness on feet (R26.81);Other abnormalities of gait and mobility (R26.89);Muscle weakness (generalized) (M62.81);Pain;Low vision, both eyes (H54.2);Other symptoms and signs involving cognitive function Pain - Right/Left: Right   Activity Tolerance Patient tolerated treatment well   Patient Left in chair;with call bell/phone within reach;with chair alarm set   Nurse Communication Mobility status;Other (comment)(Pure wick placement)        Time: 1001-1047 OT Time Calculation (min): 46 min  Charges: OT General Charges $OT Visit: 1 Visit OT Treatments $Self Care/Home Management : 23-37 mins  [place]. [place], [place] Acute Rehabilitation Services [place] 11/20/2019, 3:00 PM

## 2019-11-20 NOTE — Progress Notes (Signed)
Physical Therapy Treatment Patient Details Name: Krystal Little MRN: [id number] DOB: 06-04-1956 Today's Date: 11/20/2019    History of Present Illness Pt is 64 y.o. female presented to [facility] ED as a code stroke for  Right gaze deviation and left side weakness. 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 Pt s/p R hemicraniectomy with bone flap placed in abdomen on 4/18.  PMH HTN, DM2, chronic a. Fib (coumadin) s/p pace maker.    PT Comments    Patient continues to make progress with cognition and mobility. Continue to recommend CIR level therapies to maximize independence and safety with mobility.   Follow Up Recommendations  CIR;Supervision/Assistance - 24 hour     Equipment Recommendations  Hospital bed;Wheelchair (measurements PT);Wheelchair cushion (measurements PT);Other (comment)(hoyer)    Recommendations for Other Services       Precautions / Restrictions Precautions Precautions: Fall Precaution Comments: R hemicraniectomy- skull in R abdomen, L shoulder subluxation Restrictions Weight Bearing Restrictions: No    Mobility  Bed Mobility Overal bed mobility: Needs Assistance Bed Mobility: Rolling;Sidelying to Sit Rolling: Mod assist Sidelying to sit: Max assist;+2 for safety/equipment       General bed mobility comments: cues for sequencing; pt able to roll to L side with mod A and max A to roll to R side; pt using rail with R UE; assist to bring bilat LE to EOB and to elevate trunk into sitting   Transfers Overall transfer level: Needs assistance   Transfers: Sit to/from Stand Sit to Stand: Mod assist;+2 physical assistance;Max assist;+2 safety/equipment         General transfer comment: pt transferred bed to [facility] using Stedy standing frame; pt able to stand multiple times for transfer and peri care; mod A +2-max A +2 required due to L lateral lean, L UE support, and difficulty with cervical extension  Ambulation/Gait                  Stairs             Wheelchair Mobility    Modified Rankin (Stroke Patients Only) Modified Rankin (Stroke Patients Only) Pre-Morbid Rankin Score: No symptoms Modified Rankin: Severe disability     Balance Overall balance assessment: Needs assistance Sitting-balance support: Single extremity supported;Feet supported Sitting balance-Leahy Scale: Poor   Postural control: Left lateral lean Standing balance support: Bilateral upper extremity supported;During functional activity Standing balance-Leahy Scale: Poor                              Cognition Arousal/Alertness: Awake/alert Behavior During Therapy: Flat affect Overall Cognitive Status: Impaired/Different from baseline Area of Impairment: Attention;Following commands;Problem solving                   Current Attention Level: Sustained   Following Commands: Follows one step commands consistently;Follows one step commands with increased time     Problem Solving: Slow processing;Difficulty sequencing;Requires verbal cues General Comments: pt more alert, talkative, and keeping eyes open more during session      Exercises      General Comments        Pertinent Vitals/Pain Pain Assessment: Faces Faces Pain Scale: Hurts a little bit Pain Location: L shoulder  Pain Descriptors / Indicators: Discomfort;Guarding;Sore Pain Intervention(s): Monitored during session;Repositioned    Home Living  Prior Function            PT Goals (current goals can now be found in the care plan section) Progress towards PT goals: Progressing toward goals    Frequency    Min 4X/week      PT Plan Current plan remains appropriate    Co-evaluation PT/OT/SLP Co-Evaluation/Treatment: Yes Reason for Co-Treatment: Complexity of the patient's impairments (multi-system involvement);For patient/therapist safety;To address functional/ADL transfers PT goals addressed during  session: Mobility/safety with mobility;Balance        AM-PAC PT "6 Clicks" Mobility   Outcome Measure  Help needed turning from your back to your side while in a flat bed without using bedrails?: A Lot Help needed moving from lying on your back to sitting on the side of a flat bed without using bedrails?: A Lot Help needed moving to and from a bed to a chair (including a wheelchair)?: A Lot Help needed standing up from a chair using your arms (e.g., wheelchair or bedside chair)?: A Lot Help needed to walk in hospital room?: Total Help needed climbing 3-5 steps with a railing? : Total 6 Click Score: 10    End of Session   Activity Tolerance: Patient tolerated treatment well Patient left: with call bell/phone within reach;in chair;with chair alarm set Nurse Communication: Mobility status PT Visit Diagnosis: Difficulty in walking, not elsewhere classified (R26.2)     Time: 1610-9604 PT Time Calculation (min) (ACUTE ONLY): 47 min  Charges:  $Gait Training: 8-22 mins                     Earney Navy, PTA Acute Rehabilitation Services Pager: [phone] Office: [phone]     Darliss Cheney 11/20/2019, 2:33 PM

## 2019-11-20 NOTE — IPOC Note (Signed)
Individualized overall Plan of Care (IPOC) Patient Details Name: Krystal Little MRN: [id number] DOB: [birth year]  Admitting Diagnosis: Right middle cerebral artery stroke [facility])  Hospital Problems: Principal Problem:   Right middle cerebral artery stroke ([place]) Active Problems:   DM (diabetes mellitus), type 2, uncontrolled ([place])   Long term current use of anticoagulant therapy   S/P placement of cardiac pacemaker   Status post craniectomy   Dysphagia, post-stroke   Atrial fibrillation (H[facility])   Left hemiparesis ([place])     Functional Problem List: Nursing Bladder, Safety, Skin Integrity, Perception, Nutrition  PT Balance, Endurance, Motor, Perception, Safety, Sensory  OT Balance, Endurance, Motor, Perception, Safety, Cognition, Sensory, Vision  SLP Cognition, Linguistic, Nutrition  TR         Basic ADL's: OT Eating, Grooming, Bathing, Dressing, Toileting     Advanced  ADL's: OT       Transfers: PT Bed Mobility, Bed to Chair, Car, Manufacturing systems engineer, Metallurgist: PT Ambulation, Emergency planning/management officer, Stairs     Additional Impairments: OT Fuctional Use of Upper Extremity  SLP Swallowing, Communication, Social Cognition expression Problem Solving, Memory, Attention, Awareness  TR      Anticipated Outcomes Item Anticipated Outcome  Self Feeding supervision  Swallowing  Supervision   Basic self-care  Min A  Toileting  Min A   Bathroom Transfers Min A  Bowel/Bladder  cont x 2, min assist  Transfers  MinA LRAD  Locomotion  MinA LRAD, S in [place]  Communication  Supervision  Cognition  Min assist  Pain  less than 3  Safety/Judgment  cues/reminders   Therapy Plan: PT Intensity: Minimum of 1-2 x/day ,45 to 90 minutes PT Frequency: 5 out of 7 days PT Duration Estimated Length of Stay: 3.5-4 weeks OT Intensity: Minimum of 1-2 x/day, 45 to 90 minutes OT Frequency: 5 out of 7 days OT Duration/Estimated Length of Stay: 28-30 days SLP Intensity:  Minumum of 1-2 x/day, 30 to 90 minutes SLP Frequency: 3 to 5 out of 7 days SLP Duration/Estimated Length of Stay: 21 days    Team Interventions: Nursing Interventions Patient/Family Education, Dysphagia/Aspiration Precaution Training, Medication Management, Bladder Management, Discharge Planning, Disease Management/Prevention  PT interventions Ambulation/gait training, Discharge planning, Functional mobility training, Psychosocial support, Therapeutic Activities, Visual/perceptual remediation/compensation, Balance/vestibular training, Disease management/prevention, Neuromuscular re-education, Skin care/wound management, Therapeutic Exercise, Wheelchair propulsion/positioning, Cognitive remediation/compensation, DME/adaptive equipment instruction, Pain management, Splinting/orthotics, UE/LE Strength taining/ROM, Community reintegration, Technical sales engineer stimulation, Barrister's clerk education, IT trainer, UE/LE Coordination activities  OT Interventions Training and development officer, Cognitive remediation/compensation, Discharge planning, DME/adaptive equipment instruction, Functional mobility training, Neuromuscular re-education, Functional electrical stimulation, Patient/family education, Psychosocial support, Therapeutic Activities, Splinting/orthotics, Self Care/advanced ADL retraining, Therapeutic Exercise, UE/LE Strength taining/ROM, UE/LE Coordination activities, Visual/perceptual remediation/compensation  SLP Interventions Cognitive remediation/compensation, Cueing hierarchy, Patient/family education, Environmental controls, Dysphagia/aspiration precaution training, Internal/external aids, Speech/Language facilitation  TR Interventions    SW/CM Interventions Discharge Planning, Psychosocial Support, Patient/Family Education   Barriers to Discharge MD  Medical stability, Incontinence, Wound care, Behavior, and Nutritional means  Nursing      PT Inaccessible home environment, Medical  stability, Home environment access/layout need to clarify steps/rails  OT      SLP      SW Medical stability SW not aware of any barriers currently   Team Discharge Planning: Destination: PT-Home ,OT- Home , SLP-Home Projected Follow-up: PT-Home health PT, 24 hour supervision/assistance, OT-  Home health OT, SLP-Home Health SLP, 24 hour supervision/assistance Projected Equipment Needs: PT-To be determined, OT-  Tub/shower bench, 3 in 1 bedside comode, SLP-To be determined Equipment Details: PT- , OT-  Patient/family involved in discharge planning: PT- Patient, Family member/caregiver,  OT-Family member/caregiver, SLP-Patient  MD ELOS: 21-25 days. Medical Rehab Prognosis:  Good Assessment: Right-handed female with history of hypertension, diabetes mellitus, chronic atrial fibrillation on Coumadin therapy as well as pacemaker and mitral valve repair.  Presented 10/31/2019 left-sided weakness neglect and right gaze deviation.  Admission chemistries with hemoglobin 14.3, glucose 214, INR 1.2, urine drug screen negative.  Cranial CT scan as well as CT angiogram head and neck showed a large right MCA territory nonhemorrhagic infarction.  Hyperdense right MCA suggesting extensive thrombus from the right ICA terminus to the MCA bifurcation.  Echocardiogram with ejection fraction of 55%.  Patient underwent endovascular thrombectomy revascularization per interventional radiology.  Follow-up imaging showed worsening edema involving the right middle cerebral artery causing significant radiologic mass-effect and patient underwent decompressive right hemicraniectomy placement of bone flap into abdominal subcutaneous pocket 11/02/2019 per Dr. Kathyrn Sheriff.  Patient was cleared to begin subcutaneous Lovenox for DVT prophylaxis 11/09/2019.  CT scan follow-up 11/16/2019 decreased right hemispheric edema and decreased left midline shift.  Initially maintained on Cleviprex for blood pressure control.  Follow cardiology service  for atrial fibrillation maintained on Cardizem as well as Lopressor and her chronic Coumadin has been discontinued at this present time with aspirin 325 mg initiated 11/17/2019 and consideration is being made to resuming anticoagulation 11/21/2019 based on CT results..  Acute blood loss anemia monitored.  Currently maintained on a dysphagia #1 nectar thick liquid diet with calorie counts and nasogastric tube have been in place for nutritional support.  Patient with resulting functional deficits with mobility, transfers, endurance, swallowing, self-care.  Will set goals for Min A with PT/OT and Supervision with SLP.  Due to the current state of emergency, patients may not be receiving their 3-hours of Medicare-mandated therapy.  See Team Conference Notes for weekly updates to the plan of care

## 2019-11-20 NOTE — Plan of Care (Signed)
  Problem: Consults Goal: RH STROKE PATIENT EDUCATION Description: See Patient Education module for education specifics  Outcome: Progressing Goal: Nutrition Consult-if indicated Outcome: Progressing   Problem: RH BLADDER ELIMINATION Goal: RH STG MANAGE BLADDER WITH ASSISTANCE Description: STG Manage Bladder With Assistance Outcome: Progressing   Problem: RH SKIN INTEGRITY Goal: RH STG SKIN FREE OF INFECTION/BREAKDOWN Outcome: Progressing   Problem: RH SAFETY Goal: RH STG ADHERE TO SAFETY PRECAUTIONS W/ASSISTANCE/DEVICE Description: STG Adhere to Safety Precautions With Assistance/Device. Outcome: Progressing   Problem: RH KNOWLEDGE DEFICIT Goal: RH STG INCREASE KNOWLEDGE OF DIABETES Outcome: Progressing   Problem: RH KNOWLEDGE DEFICIT Goal: RH STG INCREASE KNOWLEDGE OF DIABETES Outcome: Progressing Goal: RH STG INCREASE KNOWLEDGE OF HYPERTENSION Outcome: Progressing Goal: RH STG INCREASE KNOWLEDGE OF DYSPHAGIA/FLUID INTAKE Outcome: Progressing Goal: RH STG INCREASE KNOWLEGDE OF HYPERLIPIDEMIA Outcome: Progressing Goal: RH STG INCREASE KNOWLEDGE OF STROKE PROPHYLAXIS Outcome: Progressing   Problem: RH KNOWLEDGE DEFICIT Goal: RH STG INCREASE KNOWLEDGE OF HYPERTENSION Outcome: Progressing   Problem: RH KNOWLEDGE DEFICIT Goal: RH STG INCREASE KNOWLEDGE OF DYSPHAGIA/FLUID INTAKE Outcome: Progressing   Problem: RH KNOWLEDGE DEFICIT Goal: RH STG INCREASE KNOWLEGDE OF HYPERLIPIDEMIA Outcome: Progressing   Problem: RH KNOWLEDGE DEFICIT Goal: RH STG INCREASE KNOWLEDGE OF STROKE PROPHYLAXIS Outcome: Progressing   Problem: RH KNOWLEDGE DEFICIT Goal: RH STG INCREASE KNOWLEDGE OF STROKE PROPHYLAXIS Outcome: Progressing

## 2019-11-20 NOTE — Progress Notes (Signed)
Krystal Heys, MD  Physician  Physical Medicine and Rehabilitation  Consult Note     Signed  Date of Service:  11/11/2019  5:04 AM      Related encounter: ED to Hosp-Admission (Current) from 10/31/2019 in [facility]      Signed      Expand AllCollapse All   Show:Clear all [x] Manual[x] Template[] Copied  Added by: [x] Angiulli, Lavon Paganini, PA-C[x] Krystal Heys, MD  [] Hover for details          Physical Medicine and Rehabilitation Consult Reason for Consult: Left side weakness with right gaze deviation Referring Physician: Dr. Leonie Man     HPI: Krystal Little is a 64 y.o. right-handed female with history of hypertension, diabetes mellitus, chronic atrial fibrillation on Coumadin therapy as well as pacemaker and mitral valve repair.  Per chart review patient lives with spouse.  1 level home 2 steps to entry.  Independent and active prior to admission.  Presented 10/31/2019 with left-sided weakness neglect and right gaze deviation.  Cranial CT scan as well as CT angiogram head and neck showed a large right MCA territory nonhemorrhagic infarction.  Hyperdense right MCA suggesting extensive thrombus from the right ICA terminus to the MCA bifurcation.  Echocardiogram with ejection fraction of 55%.  Patient underwent endovascular thrombectomy revascularization per interventional radiology.  Follow-up imaging showed worsening edema involving the right middle cerebral artery causing significant radiologic mass-effect and patient underwent decompressive right hemicraniectomy placement of bone flap into abdominal subcutaneous pocket 11/02/2019 per Dr. Kathyrn Sheriff.  Currently n.p.o. with alternative means of nutritional support.  Lovenox initiated for DVT prophylaxis 11/09/2019.  Blood pressures continue to be monitored initially in need of Cleviprex.  She was weaned from ventilator.  Cardiology follow-up for atrial fibrillation maintained on amiodarone.  She did spike a low-grade fever WBC  15,000 and currently maintained on Maxipime.  Therapy evaluations completed with recommendations of physical medicine rehab consult.     Per nurse, pt has been off any sedating meds but hasn't woken up well- amantadine was started today.  LBM yesterday and voiding with purewick- haven't checked PVRs.    Review of Systems  Unable to perform ROS: Acuity of condition        Past Medical History:  Diagnosis Date  . Chronic atrial fibrillation ([place]) 1990s    s/p DCCV then attempted ablation of complex A Flutter (@ [facility] - Dr. Deno Etienne), failed antiarrhythmics --> INR folllwed @ [facility] ->> now status post pacemaker placement with underlying A. fib.  . Diabetes mellitus type 2, uncontrolled, without complications      On Oral Medications ([facility])  . Essential hypertension    . History of cardiac catheterization 2001    R&LHC - normal Coronaries, no evidence of Restictive Cardiomyopathy or Constrictive Pericarditis (also @ [facility])  . Hx of sick sinus syndrome 07/1999    Wtih symptomatic bradycardia - syncope (Tachy-Brady)  . 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 CARDIAC CATHETERIZATION   08/03/1999    [place] - normal Coronaries; no  sign of constriction or restrictive cardiomypathy  . CRANIECTOMY FOR DEPRESSED SKULL FRACTURE Right 11/02/2019    Procedure: DECOMPRESSIVE HEMI -CRANIECTOMY, IMPLANTATION OF SKULL FLAP TO RIGHT ABDOMEN;  Surgeon: Consuella Lose, MD;  Location: [place];  Service: Neurosurgery;  Laterality: Right;  . IR CT HEAD LTD   10/31/2019  . IR  PERCUTANEOUS ART THROMBECTOMY/INFUSION INTRACRANIAL INC DIAG ANGIO   10/31/2019       . IR PERCUTANEOUS ART THROMBECTOMY/INFUSION INTRACRANIAL INC DIAG ANGIO   10/31/2019  . MITRAL VALVE REPAIR   07/1999    Both Ant& Post leaflet repair - quadrangular resection&caudal transposition, #32 Sequin Annuloplasty ring  . PACEMAKER GENERATOR CHANGE   11/26/2008    Medtronic Adapta L(? if it has been checked since 02/2013)  . PACEMAKER INSERTION   07/1999    for SSS in setting of Chronic Afib; Medtronic Kappa [id number], [id number].  Marland Kitchen RADIOLOGY WITH ANESTHESIA N/A 10/31/2019    Procedure: IR WITH ANESTHESIA;  Surgeon: Radiologist, Medication, MD;  Location: [place];  Service: Radiology;  Laterality: N/A;  . TRANSTHORACIC ECHOCARDIOGRAM   05/2018    Normal LV size and thickness.  EF estimated 50%.  Inferobasal HK and flattened septum c/w with elevated PA pressures. Mild functional mitral stenosis at rest (postoperative).  Moderate left atrial dilation and mild right atrial dilation.  Mean PA pressures estimated 52 mmHg.         Family History  Problem Relation Age of Onset  . Hypertension Mother    . Diabetes Sister    . Cancer Brother          Rectal  . Lung cancer Brother      Social History:  reports that she has never smoked. She has never used smokeless tobacco. She reports current alcohol use. She reports that she does not use drugs. Allergies: No Known Allergies       Medications Prior to Admission  Medication Sig Dispense Refill  . acetaminophen (TYLENOL) 500 MG tablet Take 500-1,000 mg by mouth every 6 (six) hours as needed for headache (pain).      Marland Kitchen atenolol (TENORMIN) 50 MG tablet TAKE 1 TABLET(50 MG) BY MOUTH DAILY (Patient taking differently: Take 50 mg by mouth daily. ) 90 tablet 0  . CHROMIUM PO Take 1 tablet by mouth daily.      Marland Kitchen diltiazem (CARDIZEM CD) 180 MG 24 hr capsule TAKE 1 CAPSULE(180 MG) BY MOUTH DAILY (Patient taking differently: Take 180 mg by mouth daily. ) 90 capsule 2  .  ezetimibe (ZETIA) 10 MG tablet Take 10 mg by mouth daily.      Haydee Salter Leaf POWD Take 1 capsule by mouth daily. "sugar destroyer"      . lisinopril (ZESTRIL) 5 MG tablet TAKE 1 TABLET(5 MG) BY MOUTH DAILY (Patient taking differently: Take 5 mg by mouth daily. ) 90 tablet 0  . metFORMIN (GLUCOPHAGE) 1000 MG tablet Take 1,000 mg by mouth 2 (two) times daily.      . Multiple Vitamin (MULTIVITAMIN WITH MINERALS) TABS tablet Take 1 tablet by mouth daily.      Marland Kitchen warfarin (COUMADIN) 5 MG tablet TAKE 1 TABLET BY MOUTH DAILY EXCEPT TAKE 1 AND 1/2 TABLET ON MONDAY AND FRIDAY (Patient taking differently: Take 5 mg by mouth See admin instructions. Take 1 1/2 tablets (7.5 mg) by mouth on Monday and Friday, take 1 tablet (5 mg) on all other days of the week) 45 tablet 3      Home: Home Living  Family/patient expects to be discharged to:: Private residence Living Arrangements: Spouse/significant other, Children, Other relatives Available Help at Discharge: Family, Available 24 hours/day Type of Home: House Home Access: Stairs to enter [employer] of Steps: 2 Home Layout: One level Bathroom Shower/Tub: Chiropodist: Standard Home Equipment: None  Functional History: Prior Function Level of Independence: Independent Comments: ADLs, IADLs, driving, and enjoys playing basketball (favorite team [employer] basketball and likes [employer] football). Working at [employer] in Therapist, art. Functional Status:  Mobility: Bed Mobility Overal bed mobility: Needs Assistance Bed Mobility: Supine to Sit, Sit to Supine Rolling: Max assist, +2 for physical assistance Sidelying to sit: Max assist, +2 for physical assistance Supine to sit: Max assist, +2 for physical assistance Sit to supine: Max assist, +2 for physical assistance General bed mobility comments: pt did attempt to move R LE off EOB, continue to require maxAX2 for trunk elevation Transfers Overall transfer  level: Needs assistance Equipment used: 2 person hand held assist(with bed pad under bottom) Transfers: Sit to/from Stand Sit to Stand: Total assist, +2 physical assistance General transfer comment: L knee blocked, max tactile cues at PSIS and anterior shoulder to achieve full upright standing, pt tolerated x 30 sec, pt did attempt to hold head up Ambulation/Gait General Gait Details: unable at this time   ADL: ADL Overall ADL's : Needs assistance/impaired Grooming: Oral care, Total assistance, Sitting Grooming Details (indicate cue type and reason): Max A for sitting balance. Total A for use of mouth swab General ADL Comments: Continues to require Total A for ADLs and bed mobility   Cognition: Cognition Overall Cognitive Status: Impaired/Different from baseline Orientation Level: Oriented to person, Oriented to place, Disoriented to time, Disoriented to situation Cognition Arousal/Alertness: Awake/alert(pt appears lethargic however pt is unable to open eyes) Behavior During Therapy: Flat affect Overall Cognitive Status: Impaired/Different from baseline Area of Impairment: Following commands, Problem solving Following Commands: Follows one step commands with increased time, Follows one step commands consistently, Follows multi-step commands inconsistently Problem Solving: Slow processing, Decreased initiation, Difficulty sequencing, Requires verbal cues, Requires tactile cues General Comments: pt remains non-verbal but is following commands Difficult to assess due to: Intubated   Blood pressure 123/69, pulse 99, temperature 99.4 F (37.4 C), temperature source Axillary, resp. rate (!) 28, height 5\' 4"  (1.626 m), weight 60.3 kg, SpO2 99 %. Physical Exam  Nursing note and vitals reviewed. Constitutional: She appears well-nourished. No distress.  Older female laying supine, NGT in place with O2 by [place], pulse 61, sats 97% and RR 27, no acute distress Would not wake to verbal or light  tactile stimuli  HENT:  Head: Normocephalic.  u shaped incision on R top of head to R temple- dried/clotted blood noted- covered with dressings  NGT in place as stated above and O2 by [place]  Eyes:  Eyes closed - doesn't wake to stimulation- could not get pt to open eyes Appears to have L facial droop at rest  Neck: No tracheal deviation present.  Cardiovascular:  RRR- borderline bradycardia- regular rhythm  Respiratory: No stridor.  Coarse, junky- but good air movement B/L  GI:  Soft, NT, ND; (+)BS hypoactive  Musculoskeletal:     Cervical back: Normal range of motion and neck supple.     Comments: No movement volitional movement seen Has mitten on R hand only- per nurse, no volitional movement on L- has movement normally on R side  Neurological:  Patient is lethargic but arousable.  Nasogastric tube in place.  She would not open her eyes but she did raise her right hand on verbal command.  She remained nonverbal throughout exam No vocalizations, no verbalizations Didn't respond to ticklish movements on hands and feet B/L; asleep- would not wake  Skin:  Head incision as stated before 2 IVs LUE- no signs of infiltration  Psychiatric:  Asleep       Lab Results Last 24 Hours       Results for orders placed or performed during the hospital encounter of 10/31/19 (from the past 24 hour(s))  Glucose, capillary     Status: Abnormal    Collection Time: 11/09/19 11:15 AM  Result Value Ref Range    Glucose-Capillary 199 (H) 70 - 99 mg/dL  Glucose, capillary     Status: Abnormal    Collection Time: 11/09/19  3:11 PM  Result Value Ref Range    Glucose-Capillary 111 (H) 70 - 99 mg/dL  I-STAT 7, (LYTES, BLD GAS, ICA, H+H)     Status: Abnormal    Collection Time: 11/09/19  5:11 PM  Result Value Ref Range    pH, Arterial 7.558 (H) 7.350 - 7.450    pCO2 arterial 40.2 32.0 - 48.0 mmHg    pO2, Arterial 64 (L) 83.0 - 108.0 mmHg    Bicarbonate 35.5 (H) 20.0 - 28.0 mmol/L    TCO2 37 (H) 22 -  32 mmol/L    O2 Saturation 94.0 %    Acid-Base Excess 12.0 (H) 0.0 - 2.0 mmol/L    Sodium 149 (H) 135 - 145 mmol/L    Potassium 3.3 (L) 3.5 - 5.1 mmol/L    Calcium, Ion 1.09 (L) 1.15 - 1.40 mmol/L    HCT 41.0 36.0 - 46.0 %    Hemoglobin 13.9 12.0 - 15.0 g/dL    Patient temperature 100.4 F      Collection site Radial      Drawn by RT      Sample type ARTERIAL    Urinalysis, Routine w reflex microscopic     Status: None    Collection Time: 11/09/19  5:52 PM  Result Value Ref Range    Color, Urine YELLOW YELLOW    APPearance CLEAR CLEAR    Specific Gravity, Urine 1.015 1.005 - 1.030    pH 8.0 5.0 - 8.0    Glucose, UA NEGATIVE NEGATIVE mg/dL    Hgb urine dipstick NEGATIVE NEGATIVE    Bilirubin Urine NEGATIVE NEGATIVE    Ketones, ur NEGATIVE NEGATIVE mg/dL    Protein, ur NEGATIVE NEGATIVE mg/dL    Nitrite NEGATIVE NEGATIVE    Leukocytes,Ua NEGATIVE NEGATIVE  Glucose, capillary     Status: Abnormal    Collection Time: 11/09/19  7:51 PM  Result Value Ref Range    Glucose-Capillary 245 (H) 70 - 99 mg/dL  Glucose, capillary     Status: Abnormal    Collection Time: 11/09/19 11:37 PM  Result Value Ref Range    Glucose-Capillary 264 (H) 70 - 99 mg/dL  Glucose, capillary     Status: Abnormal    Collection Time: 11/10/19  3:27 AM  Result Value Ref Range    Glucose-Capillary 199 (H) 70 - 99 mg/dL  CBC     Status: Abnormal    Collection Time: 11/10/19  4:57 AM  Result Value Ref Range    WBC 16.2 (H) 4.0 - 10.5 K/uL    RBC 3.34 (L) 3.87 - 5.11 MIL/uL    Hemoglobin 10.1 (L) 12.0 - 15.0 g/dL    HCT  32.3 (L) 36.0 - 46.0 %    MCV 96.7 80.0 - 100.0 fL    MCH 30.2 26.0 - 34.0 pg    MCHC 31.3 30.0 - 36.0 g/dL    RDW 14.6 11.5 - 15.5 %    Platelets 215 150 - 400 K/uL    nRBC 0.1 0.0 - 0.2 %  Basic metabolic panel     Status: Abnormal    Collection Time: 11/10/19  4:57 AM  Result Value Ref Range    Sodium 147 (H) 135 - 145 mmol/L    Potassium 3.7 3.5 - 5.1 mmol/L    Chloride 104 98 -  111 mmol/L    CO2 32 22 - 32 mmol/L    Glucose, Bld 257 (H) 70 - 99 mg/dL    BUN 30 (H) 8 - 23 mg/dL    Creatinine, Ser 0.94 0.44 - 1.00 mg/dL    Calcium 8.4 (L) 8.9 - 10.3 mg/dL    GFR calc non Af Amer >60 >60 mL/min    GFR calc Af Amer >60 >60 mL/min    Anion gap 11 5 - 15  Glucose, capillary     Status: Abnormal    Collection Time: 11/10/19  7:56 AM  Result Value Ref Range    Glucose-Capillary 286 (H) 70 - 99 mg/dL    Comment 1 Notify RN      Comment 2 Document in Chart         Imaging Results (Last 48 hours)  DG Abd 1 View   Result Date: 11/09/2019 CLINICAL DATA:  Evaluate NG tube EXAM: ABDOMEN - 1 VIEW COMPARISON:  None. FINDINGS: The NG tube terminates in the stomach in good position. Postoperative changes are seen over the right lower quadrant. IMPRESSION: The NG tube terminates in the stomach, in good position. Electronically Signed   By: Dorise Bullion III M.D   On: 11/09/2019 17:23    CT HEAD WO CONTRAST   Result Date: 11/09/2019 CLINICAL DATA:  Follow-up examination for acute stroke. EXAM: CT HEAD WITHOUT CONTRAST TECHNIQUE: Contiguous axial images were obtained from the base of the skull through the vertex without intravenous contrast. COMPARISON:  Prior head CT from 11/06/2019. FINDINGS: Brain: There has been continued interval evolution of large right MCA territory infarct, stable in size and distribution as compared to previous. Associated edema is more hypodense as compared to previous, although relatively similar in size and morphology. Associated hemorrhagic transformation with hemorrhage involving the right caudate and lentiform nuclei is not significantly changed. Additional scattered hemorrhage throughout the infarcted territory also little interval changed. Blood again noted along the right sylvian fissure. Prior right craniectomy with similar swelling of the brain through the craniectomy defect. Persistent right-to-left midline shift measures up to 12 mm, little  interval change from previous. Asymmetric dilatation of the left lateral ventricle is stable. Similar basilar cistern crowding. No new findings seen within the left cerebral hemisphere, brainstem, or cerebellum. Vascular: No new hyperdense vessel. Scattered vascular calcifications noted within the carotid siphons. Skull: Right craniectomy without adverse features. Skin staples remain in place. Sinuses/Orbits: Globes and orbital soft tissues within normal limits.Paranasal sinuses remain largely clear. No mastoid effusion. Other: None. IMPRESSION: 1. Continued interval evolution of large right MCA territory infarct with associated hemorrhagic transformation, relatively stable as compared to 11/06/2019. No evidence for interval hemorrhage or re-bleeding. Associated mass effect with up to 12 mm of right-to-left shift not significantly changed. Stable asymmetric dilatation of the left lateral ventricle. 2. Right craniectomy without adverse  features. Electronically Signed   By: Jeannine Boga M.D.   On: 11/09/2019 06:15    DG CHEST PORT 1 VIEW   Result Date: 11/09/2019 CLINICAL DATA:  S/p extubation. EXAM: PORTABLE CHEST 1 VIEW COMPARISON:  11/09/2019 FINDINGS: An endotracheal tube and NG tube have been removed. A RIGHT pleural effusion, RIGHT basilar atelectasis, and LEFT LOWER lung consolidation/atelectasis again noted. Median sternotomy, RIGHT-sided pacemaker and LEFT PICC line again identified. IMPRESSION: Endotracheal tube and NG tube removal without other significant change. Electronically Signed   By: Margarette Canada M.D.   On: 11/09/2019 17:02    DG CHEST PORT 1 VIEW   Result Date: 11/09/2019 CLINICAL DATA:  Code stroke.  Patient intubated. EXAM: PORTABLE CHEST 1 VIEW COMPARISON:  November 08, 2019 FINDINGS: The ETT is in good position. The left PICC line is in good position. The NG tube terminates below today's film. Stable pacemaker. Small right effusion with underlying opacity, similar in the interval.  Left retrocardiac opacity remains with a probable small associated left effusion which is less prominent the interval. No other interval changes. IMPRESSION: 1. Support apparatus as above. 2. Stable small right effusion with underlying atelectasis. Stable left retrocardiac opacity. The small layering left effusion seen previously is less prominent. Electronically Signed   By: Dorise Bullion III M.D   On: 11/09/2019 11:54         Assessment/Plan: Diagnosis: Large R MCA infarct s/p thrombectomy and R hemicraniectomy with bone flap in abdomen L hemiplegia with sedation 1. Does the need for close, 24 hr/day medical supervision in concert with the patient's rehab needs make it unreasonable for this patient to be served in a less intensive setting? Potentially 2. Co-Morbidities requiring supervision/potential complications: DM, HTN, A fib, pacemaker, L hemiplegia 3. Due to bladder management, bowel management, safety, skin/wound care, disease management, medication administration, pain management and patient education, does the patient require 24 hr/day rehab nursing? Potentially 4. Does the patient require coordinated care of a physician, rehab nurse, therapy disciplines of PT, OT, and SLP to address physical and functional deficits in the context of the above medical diagnosis(es)? Yes Addressing deficits in the following areas: balance, endurance, locomotion, strength, transferring, bathing, dressing, feeding, grooming, toileting, cognition, speech, language and swallowing 5. Can the patient actively participate in an intensive therapy program of at least 3 hrs of therapy per day at least 5 days per week? Yes 6. The potential for patient to make measurable gains while on inpatient rehab is good and fair 7. Anticipated functional outcomes upon discharge from inpatient rehab are min assist  with PT, min assist with OT, min assist with SLP. 8. Estimated rehab length of stay to reach the above functional  goals is: 3 weeks 9. Anticipated discharge destination: Home 10. Overall Rehab/Functional Prognosis: good and fair   RECOMMENDATIONS: This patient's condition is appropriate for continued rehabilitative care in the following setting: CIR Patient has agreed to participate in recommended program. Potentially Note that insurance prior authorization may be required for reimbursement for recommended care.   Comment:  1. If Amantadine doesn't wake pt enough, since BP is well controlled, can think about low dose Ritalin 5mg  BID with breakfast, lunch timing.    2. Sounded a little junky today   3. Thank you for this consult- will con't to follow remotely for CIR.      Lavon Paganini Angiulli, PA-C    I have personally performed a face to face diagnostic evaluation of this patient and formulated the key  components of the plan.  Additionally, I have personally reviewed laboratory data, imaging studies, as well as relevant notes and concur with the physician assistant's documentation above.     11/10/2019        Revision History                     Routing History

## 2019-11-20 NOTE — Progress Notes (Signed)
Cristina Gong, RN  Rehab Admission Coordinator  Physical Medicine and Rehabilitation  PMR Pre-admission     Signed  Date of Service:  11/19/2019  5:21 PM      Related encounter: ED to Hosp-Admission (Current) from 10/31/2019 in [facility]      Signed        Show:Clear all _0 Manual_1 Template_2 Copied  Added by: _3 Cristina Gong, RN  _4 Hover for details PMR Admission Coordinator Pre-Admission Assessment   Patient: Krystal Little is an 64 y.o., female MRN: [id number] DOB: Dec 13, 1955 Height: _5  (162.6 cm) Weight: 66 kg                                                                                                                                                  Insurance Information   PRIMARY: [facility]      Policy#: [id number]      Subscriber: pt CM Name: Alba Cory  Phone#: [phone] option 3     Fax#: [phone] Pre-Cert#: [id number] approved for 7 days      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 Date: 07/18/2019     Deduct: none      Out of Pocket Max: $3600      Life Max: none  CIR: $295 co pay per day days 1 until 5      SNF: no copay days 1 until 20; $184 co pay per day days 21 until 40; no cap[y days 41 until 100 Outpatient: $30 per visit     Co-Pay: visits per medical neccesity Home Health: 100%      Co-Pay: visits per medical necessity DME: 80%     Co-Pay: 20% Providers: in network  SECONDARY: none   Financial Counselor:       Phone#:    The Engineer, petroleum" for patients in Inpatient Rehabilitation Facilities with attached "Privacy Act [place] Records" was provided and verbally reviewed with: Family   Emergency Contact Information         Contact Information     Name Relation Home Work Clay [phone]   [phone]    Jessyca, Sloan Daughter [phone]        Mariea Clonts     [phone]       Current Medical History  Patient Admitting Diagnosis: right MCA infarct and right hemicraniectomy   History of Present Illness: 64 year old right-handed female with history of hypertension, diabetes mellitus, chronic atrial fibrillation on Coumadin therapy as well as pacemaker and mitral valve repair.   Presented 10/31/2019 left-sided weakness neglect and right gaze deviation.  Admission chemistries with hemoglobin 14.3, glucose 214, INR 1.2, urine drug screen negative.  Cranial CT scan as well as CT angiogram head and neck showed  a large right MCA territory nonhemorrhagic infarction.  Hyperdense right MCA suggesting extensive thrombus from the right ICA terminus to the MCA bifurcation.  Echocardiogram with ejection fraction of 55%.  Patient underwent endovascular thrombectomy revascularization per interventional radiology.  Follow-up imaging showed worsening edema involving the right middle cerebral artery causing significant radiologic mass-effect and patient underwent decompressive right hemicraniectomy placement of bone flap into abdominal subcutaneous pocket 11/02/2019 per Dr. Kathyrn Sheriff.  Patient was cleared to begin subcutaneous Lovenox for DVT prophylaxis 11/09/2019.  CT scan follow-up 11/16/2019 decreased right hemispheric edema and decreased left midline shift.  Initially maintained on Cleviprex for blood pressure control.  Follow cardiology service for atrial fibrillation maintained on Cardizem as well as Lopressor and her chronic Coumadin has been discontinued at this present time with aspirin 325 mg initiated 11/17/2019 and consideration is being made to resuming anticoagulation 11/21/2019 based on CT results..  Acute blood loss anemia 9.7 and monitored.  Currently maintained on a dysphagia #1 nectar thick liquid diet with calorie counts and nasogastric tube have been in place for nutritional support for nocturnal supplementation.    Complete NIHSS TOTAL: 8 Glasgow Coma  Scale Score: 15   Past Medical History      Past Medical History:  Diagnosis Date  . Chronic atrial fibrillation ([place]) 1990s    s/p DCCV then attempted ablation of complex A Flutter (@ [facility] - Dr. Deno Etienne), failed antiarrhythmics --> INR folllwed @ [facility] ->> now status post pacemaker placement with underlying A. fib.  . Diabetes mellitus type 2, uncontrolled, without complications      On Oral Medications ([facility])  . Essential hypertension    . History of cardiac catheterization 2001    R&LHC - normal Coronaries, no evidence of Restictive Cardiomyopathy or Constrictive Pericarditis (also @  [facility])  . Hx of sick sinus syndrome 07/1999    Wtih symptomatic bradycardia - syncope (Tachy-Brady)  . 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   Prior Rehab/Hospitalizations:  Has the patient had prior rehab or hospitalizations prior to admission? Yes   Has the patient had major surgery during 100 days prior to admission? Yes   Current Medications    Current Facility-Administered Medications:  .  0.9 %  sodium chloride infusion, , Intravenous, Continuous, Donzetta Starch, NP, Stopped at 11/18/19 2252 .  acetaminophen (TYLENOL) tablet 650 mg, 650 mg, Oral, Q4H PRN **OR** acetaminophen (TYLENOL) 160 MG/5ML solution 650 mg, 650 mg, Per Tube, Q4H PRN, 650 mg at 11/17/19 1937 **OR** acetaminophen (TYLENOL) suppository 650 mg, 650 mg, Rectal, Q4H PRN, Donzetta Starch, NP, 650 mg at 11/01/19  0921 .  amantadine (SYMMETREL) 50 MG/5ML solution 100 mg, 100 mg, Per Tube, BID, Biby, Sharon L, NP, 100 mg at 11/20/19 0943 .  aspirin tablet 325 mg, 325 mg, Per Tube, Daily, Blenda Nicely, RPH, 325 mg at 11/20/19 2297 .  atorvastatin (LIPITOR) tablet 40 mg, 40 mg, Per Tube, Daily, Blenda Nicely, RPH, 40  mg at 11/20/19 0944 .  chlorhexidine gluconate (MEDLINE KIT) (PERIDEX) 0.12 % solution 15 mL, 15 mL, Mouth Rinse, BID, Biby, Sharon L, NP, 15 mL at 11/20/19 0942 .  Chlorhexidine Gluconate Cloth 2 % PADS 6 each, 6 each, Topical, Q0600, Donzetta Starch, NP, 6 each at 11/20/19 0550 .  diltiazem (CARDIZEM) 10 mg/ml oral suspension 60 mg, 60 mg, Per Tube, Q6H, Biby, Sharon L, NP, 60 mg at 11/20/19 0549 .  enoxaparin (LOVENOX) injection 40 mg, 40 mg, Subcutaneous, Q24H, Biby, Sharon L, NP, 40 mg at 11/19/19 1311 .  feeding supplement (GLUCERNA 1.5 CAL) liquid 500 mL, 500 mL, Per Tube, Q24H, Rosalin Hawking, MD, 500 mL at 11/19/19 2057 .  feeding supplement (PRO-STAT SUGAR FREE 64) liquid 30 mL, 30 mL, Oral, BID, Rosalin Hawking, MD, 30 mL at 11/20/19 0942 .  fentaNYL (SUBLIMAZE) injection 25-100 mcg, 25-100 mcg, Intravenous, Q2H PRN, Biby, Sharon L, NP, 25 mcg at 11/14/19 0230 .  insulin aspart (novoLOG) injection 0-9 Units, 0-9 Units, Subcutaneous, TID WC, Rosalin Hawking, MD, 7 Units at 11/19/19 1648 .  insulin aspart (novoLOG) injection 8 Units, 8 Units, Subcutaneous, BID, Rosalin Hawking, MD, 8 Units at 11/20/19 0028 .  insulin glargine (LANTUS) injection 20 Units, 20 Units, Subcutaneous, BID, Rosalin Hawking, MD, 20 Units at 11/20/19 [phone] .  MEDLINE mouth rinse, 15 mL, Mouth Rinse, 10 times per day, Burnetta Sabin L, NP, 15 mL at 11/20/19 0944 .  metoprolol tartrate (LOPRESSOR) injection 2.5 mg, 2.5 mg, Intravenous, Q6H PRN, Burnetta Sabin L, NP, 2.5 mg at 11/07/19 0640 .  metoprolol tartrate (LOPRESSOR) tablet 25 mg, 25 mg, Per Tube, BID, Rosalin Hawking, MD, 25 mg at 11/20/19 0943 .  multivitamin with minerals tablet 1  tablet, 1 tablet, Oral, Daily, Rosalin Hawking, MD, 1 tablet at 11/20/19 0943 .  ondansetron (ZOFRAN) injection 4 mg, 4 mg, Intravenous, Q6H PRN, Burnetta Sabin L, NP, 4 mg at 11/01/19 0640 .  pantoprazole sodium (PROTONIX) 40 mg/20 mL oral suspension 40 mg, 40 mg, Per Tube, Daily, Biby, Sharon L, NP, 40 mg at 11/20/19 0944 .  polyethylene glycol (MIRALAX / GLYCOLAX) packet 17 g, 17 g, Per Tube, BID, Biby, Sharon L, NP, 17 g at 11/20/19 0942 .  Resource [employer], , Oral, PRN, Rosalin Hawking, MD .  senna-docusate (Senokot-S) tablet 1 tablet, 1 tablet, Per Tube, QHS PRN, Miachel Roux, Sharon L, NP .  senna-docusate (Senokot-S) tablet 1 tablet, 1 tablet, Per Tube, BID, Donzetta Starch, NP, 1 tablet at 11/20/19 0943 .  sodium chloride flush (NS) 0.9 % injection 10-40 mL, 10-40 mL, Intracatheter, Q12H, Biby, Sharon L, NP, 10 mL at 11/20/19 0944 .  sodium chloride flush (NS) 0.9 % injection 10-40 mL, 10-40 mL, Intracatheter, PRN, Biby, Sharon L, NP .  sodium chloride HYPERTONIC 3 % nebulizer solution 4 mL, 4 mL, Nebulization, PRN, Rosalin Hawking, MD   Patients Current Diet:     Diet Order                      DIET - DYS 1 Room service appropriate? Yes; Fluid consistency: Nectar Thick  Diet effective now                   Precautions / Restrictions Precautions Precautions: Fall Precaution Comments: R hemicraniectomy- skull in R abdomen, L shoulder subluxation Restrictions Weight Bearing Restrictions: No    Has the patient had 2 or more falls or a fall with injury in the past year?No  Prior Activity Level Community (5-7x/wk): Independent, driving and working   Prior Functional Level Prior Function Level of Independence: Independent Comments: ADLs, IADLs, driving, and enjoys playing basketball (favorite team [employer] basketball and likes [employer] football). Working at [employer] in Therapist, art.   Self Care: Did the patient need help bathing, dressing, using the toilet or eating?  Independent    Indoor Mobility: Did the patient need assistance with walking from room to room (with or without device)? Independent   Stairs: Did the patient need assistance with internal or external stairs (with or without device)? Independent   Functional Cognition: Did the patient need help planning regular tasks such as shopping or remembering to take medications? Independent   Home Assistive Devices / Equipment Home Assistive Devices/Equipment: None Home Equipment: None   Prior Device Use: Indicate devices/aids used by the patient prior to current illness, exacerbation or injury? None of the above   Current Functional Level Cognition   Overall Cognitive Status: Impaired/Different from baseline Difficult to assess due to: Impaired communication Current Attention Level: Sustained Orientation Level: Oriented X4 Following Commands: Follows one step commands consistently, Follows one step commands with increased time Safety/Judgement: Decreased awareness of safety, Decreased awareness of deficits General Comments: keeps eyes closed most of the time; soft voice and slow to respond but appropriate and oriented      Extremity Assessment (includes Sensation/Coordination)   Upper Extremity Assessment: LUE deficits/detail, RUE deficits/detail RUE Deficits / Details: thumbs up and down, reaching for therapist in midline.  RUE Coordination: decreased fine motor, decreased gross motor LUE Deficits / Details: no activation LUE Coordination: decreased fine motor, decreased gross motor  Lower Extremity Assessment: Defer to PT evaluation     ADLs   Overall ADL's : Needs assistance/impaired Eating/Feeding: Maximal assistance, Sitting Eating/Feeding Details (indicate cue type and reason): EOB, pt working on self feeding with spoon. Pt able to self feed right side of plate of food with min A in supported sitting, VCs for getting all food off of spoon. Pt did not see other food on left side of plate--needed to  shift food more in front of her to see it or hold it midline or to her right for her to see and work on self feeding. Pt's tendency in sitting is to have a flexed neck posture, when asked to look up there is minmal movement of her neck but she does shift her gaze upward. Grooming: Wash/dry face, Maximal assistance Grooming Details (indicate cue type and reason): told to wash face and washing only R eye, with tactile cues able to wash forehead minimally on L side Toileting- Clothing Manipulation and Hygiene: Total assistance, +2 for physical assistance, +2 for safety/equipment, Bed level Toileting - Clothing Manipulation Details (indicate cue type and reason): rolling and peri care General ADL Comments: Pt requires max cues to keep eyes open but more more receptive to commands to attempt to weight shift at trunk in each plane, pt able to sit EOB for < 15 minutes, pt with increased tightness in neck, ranging attempted, but pt would gaurd with movement     Mobility   Overal bed mobility: Needs Assistance Bed Mobility: Rolling, Sidelying to Sit Rolling: Min assist, Max assist Sidelying to sit: Max assist, +2 for safety/equipment Supine to sit: Mod assist, +2 for safety/equipment Sit to supine: Max assist, +2 for physical assistance, +2 for safety/equipment Sit to sidelying: Total assist, +2 for physical assistance General bed mobility comments: cues for sequencing; pt able to roll to  L side with min A and max A to roll to R side; pt using rail with R UE; assist to bring bilat LE to EOB and to elevate trunk into sitting      Transfers   Overall transfer level: Needs assistance Equipment used: 2 person hand held assist(with bed pad under bottom) Transfer via Lift Equipment: Stedy Transfers: Sit to/from Stand Sit to Stand: Mod assist, +2 physical assistance, From elevated surface General transfer comment: pt able to stand X 2 using stedy standing frame; assist to power up with L UE supported and to  maintain balance in standing      Ambulation / Gait / Stairs / Wheelchair Mobility   Ambulation/Gait General Gait Details: unable at this time     Posture / Balance Dynamic Sitting Balance Sitting balance - Comments: pt able to maintain balance with mod A; assist for cervical extension  Balance Overall balance assessment: Needs assistance Sitting-balance support: Single extremity supported, Feet supported Sitting balance-Leahy Scale: Poor Sitting balance - Comments: pt able to maintain balance with mod A; assist for cervical extension  Postural control: Posterior lean Standing balance support: Bilateral upper extremity supported, During functional activity Standing balance-Leahy Scale: Poor Standing balance comment: dependent on physical assist     Special needs/care consideration   Skin surgical incision with staples; right abdomen with bone flap; right distal leg with venous stasis ulcer 2 cm x 2 cm x0 cm Cortrak placed 4/30 43 inches left nare Diabetic management with Hgb A1c 9.7 Designated visitors are spouse, Programmer, systems and daughter, Jeannetta Nap    Previous Home Environment    Living Arrangements: Spouse/significant other, Children, Other relatives  Lives With: Spouse, Daughter Available Help at Discharge: Family, Available 24 hours/day Type of Home: House Home Layout: One level Home Access: Stairs to enter Technical brewer of Steps: 2 Bathroom Shower/Tub: Chiropodist: Standard Bathroom Accessibility: Yes How Accessible: Accessible via walker Home Care Services: No Additional Comments: verified by spouse   Discharge Living Setting Plans for Discharge Living Setting: Patient's home, Lives with (comment)(spouse and daughter) Type of Home at Discharge: House Discharge Home Layout: One level Discharge Home Access: Stairs to enter Entrance Stairs-Rails: None Entrance Stairs-Number of Steps: 2 Discharge Bathroom Shower/Tub: Tub/shower unit Discharge  Bathroom Toilet: Standard Discharge Bathroom Accessibility: Yes How Accessible: Accessible via walker Does the patient have any problems obtaining your medications?: No   Social/Family/Support Systems Patient Roles: Spouse, Parent(employee) Contact Information: spouse milton Anticipated Caregiver: spouse and daughter Anticipated Caregiver's Contact Information: see above Ability/Limitations of Caregiver: no limitations Caregiver Availability: 24/7 Discharge Plan Discussed with Primary Caregiver: Yes Is Caregiver In Agreement with Plan?: Yes Does Caregiver/Family have Issues with Lodging/Transportation while Pt is in Rehab?: No   Goals Patient/Family Goal for Rehab: min PT and OT, supervision with SLP Expected length of stay: ELOS 2 to 3 weeks Pt/Family Agrees to Admission and willing to participate: Yes Program Orientation Provided & Reviewed with Pt/Caregiver Including Roles  & Responsibilities: Yes   Decrease burden of Care through IP rehab admission: n/a   Possible need for SNF placement upon discharge:not anticipated   Patient Condition: This patient's medical and functional status has changed since the consult dated: 11/11/2019 in which the Rehabilitation Physician determined and documented that the patient's condition is appropriate for intensive rehabilitative care in an inpatient rehabilitation facility. See "History of Present Illness" (above) for medical update. Functional changes are: mod to max assist. Patient's medical and functional status update has been discussed with the Rehabilitation  physician and patient remains appropriate for inpatient rehabilitation. Will admit to inpatient rehab today.   Preadmission Screen Completed By:  Cleatrice Burke, RN, 11/20/2019 11:24 AM ______________________________________________________________________   Discussed status with Dr. Dagoberto Ligas on 11/20/2019 at  1125 and received approval for admission today.   Admission Coordinator:   Cleatrice Burke, time [id number] Date 11/20/2019             Cosigned by: Courtney Heys, MD at 11/20/2019 11:44 AM  Revision History

## 2019-11-20 NOTE — Progress Notes (Signed)
Pt arrived to unit approx 1820 via bed. Belongings at bedside. Resting quietly in bed. Oriented to unit and safety precautions. No visitors present. Denies pain. Crani site and bone flap site OTA with staples intact. purewick removed and peri care provided. Call bell placed in reach. Bed in lowest position with bed alarm in place. Will cont to monitor.  Erie Noe, RN

## 2019-11-20 NOTE — Progress Notes (Signed)
Inpatient Rehabilitation Admissions Coordinator  I have insurance approval and inpt rehab bed available to admit patient to today. No family at bedside, but I have spoken with daughter, Jeannetta Nap by phone to make the arrangements. I have notified Burnetta Sabin, [facility] as well as TOC team. I will make the arrangements to admit today.  Danne Baxter, RN, MSN Rehab Admissions Coordinator [phone] 11/20/2019 10:27 AM

## 2019-11-20 NOTE — TOC Transition Note (Signed)
Transition of Care [facility]) - CM/SW Discharge Note   Patient Details  Name: Krystal Little MRN: [id number] Date of Birth: 1955-09-18  [facility]) CM/SW Contact:  Pollie Friar, RN Phone Number: 11/20/2019, 1:33 PM   Clinical Narrative:    Pt is discharging to CIR today. CM signing off.    Final next level of care: IP Rehab Facility Barriers to Discharge: No Barriers Identified   Patient Goals and CMS Choice        Discharge Placement                       Discharge Plan and Services                                     Social Determinants of Health (SDOH) Interventions     Readmission Risk Interventions No flowsheet data found.

## 2019-11-21 ENCOUNTER — Inpatient Hospital Stay (HOSPITAL_COMMUNITY): Payer: Medicare Other | Admitting: Physical Therapy

## 2019-11-21 ENCOUNTER — Inpatient Hospital Stay (HOSPITAL_COMMUNITY): Payer: Medicare Other

## 2019-11-21 ENCOUNTER — Inpatient Hospital Stay (HOSPITAL_COMMUNITY): Payer: Medicare Other | Admitting: Speech Pathology

## 2019-11-21 ENCOUNTER — Inpatient Hospital Stay (HOSPITAL_COMMUNITY): Payer: Medicare Other | Admitting: Occupational Therapy

## 2019-11-21 DIAGNOSIS — Z7901 Long term (current) use of anticoagulants: Secondary | ICD-10-CM | POA: Diagnosis not present

## 2019-11-21 DIAGNOSIS — E1165 Type 2 diabetes mellitus with hyperglycemia: Secondary | ICD-10-CM | POA: Diagnosis not present

## 2019-11-21 DIAGNOSIS — D62 Acute posthemorrhagic anemia: Secondary | ICD-10-CM

## 2019-11-21 DIAGNOSIS — I63511 Cerebral infarction due to unspecified occlusion or stenosis of right middle cerebral artery: Secondary | ICD-10-CM | POA: Diagnosis not present

## 2019-11-21 DIAGNOSIS — Z9889 Other specified postprocedural states: Secondary | ICD-10-CM

## 2019-11-21 DIAGNOSIS — G8194 Hemiplegia, unspecified affecting left nondominant side: Secondary | ICD-10-CM

## 2019-11-21 DIAGNOSIS — I4891 Unspecified atrial fibrillation: Secondary | ICD-10-CM

## 2019-11-21 DIAGNOSIS — I69391 Dysphagia following cerebral infarction: Secondary | ICD-10-CM

## 2019-11-21 LAB — COMPREHENSIVE METABOLIC PANEL
ALT: 23 U/L (ref 0–44)
AST: 30 U/L (ref 15–41)
Albumin: 2.4 g/dL — ABNORMAL LOW (ref 3.5–5.0)
Alkaline Phosphatase: 179 U/L — ABNORMAL HIGH (ref 38–126)
Anion gap: 11 (ref 5–15)
BUN: 8 mg/dL (ref 8–23)
CO2: 25 mmol/L (ref 22–32)
Calcium: 8.7 mg/dL — ABNORMAL LOW (ref 8.9–10.3)
Chloride: 102 mmol/L (ref 98–111)
Creatinine, Ser: 0.53 mg/dL (ref 0.44–1.00)
GFR calc Af Amer: >60 mL/min (ref >60)
GFR calc non Af Amer: >60 mL/min (ref >60)
Glucose, Bld: 76 mg/dL (ref 70–99)
Potassium: 4 mmol/L (ref 3.5–5.1)
Sodium: 138 mmol/L (ref 135–145)
Total Bilirubin: 0.6 mg/dL (ref 0.3–1.2)
Total Protein: 7.3 g/dL (ref 6.5–8.1)

## 2019-11-21 LAB — CBC WITH DIFFERENTIAL/PLATELET
Abs Immature Granulocytes: 0.04 10*3/uL (ref 0.00–0.07)
Basophils Absolute: 0 10*3/uL (ref 0.0–0.1)
Basophils Relative: 0 %
Eosinophils Absolute: 0.1 10*3/uL (ref 0.0–0.5)
Eosinophils Relative: 1 %
HCT: 35.2 % — ABNORMAL LOW (ref 36.0–46.0)
Hemoglobin: 11.4 g/dL — ABNORMAL LOW (ref 12.0–15.0)
Immature Granulocytes: 1 %
Lymphocytes Relative: 17 %
Lymphs Abs: 1.4 10*3/uL (ref 0.7–4.0)
MCH: 31.8 pg (ref 26.0–34.0)
MCHC: 32.4 g/dL (ref 30.0–36.0)
MCV: 98.1 fL (ref 80.0–100.0)
Monocytes Absolute: 0.7 10*3/uL (ref 0.1–1.0)
Monocytes Relative: 9 %
Neutro Abs: 5.9 10*3/uL (ref 1.7–7.7)
Neutrophils Relative %: 72 %
Platelets: 346 10*3/uL (ref 150–400)
RBC: 3.59 MIL/uL — ABNORMAL LOW (ref 3.87–5.11)
RDW: 16.2 % — ABNORMAL HIGH (ref 11.5–15.5)
WBC: 8.2 10*3/uL (ref 4.0–10.5)
nRBC: 0 % (ref 0.0–0.2)

## 2019-11-21 LAB — GLUCOSE, CAPILLARY
Glucose-Capillary: 230 mg/dL — ABNORMAL HIGH (ref 70–99)
Glucose-Capillary: 242 mg/dL — ABNORMAL HIGH (ref 70–99)
Glucose-Capillary: 77 mg/dL (ref 70–99)

## 2019-11-21 MED ORDER — CHLORHEXIDINE GLUCONATE CLOTH 2 % EX PADS: 6.0000 | MEDICATED_PAD | Freq: Two times a day (BID) | CUTANEOUS | Status: DC

## 2019-11-21 MED ORDER — GLUCERNA 1.5 CAL PO LIQD
500.0000 mL | ORAL | Status: DC
  Administered 2019-11-21 – 2019-11-22 (×2): 500 mL
  Filled 2019-11-21 (×2): qty 711

## 2019-11-21 NOTE — Evaluation (Signed)
Physical Therapy Assessment and Plan  Patient Details  Name: Krystal Little MRN: [id number] Date of Birth: 1955-12-25  PT Diagnosis: Abnormal posture, Abnormality of gait, Coordination disorder, Difficulty walking, Hemiplegia non-dominant, Impaired cognition and Muscle weakness Rehab Potential: Good ELOS: 3.5-4 weeks   Today's Date: 11/21/2019 PT Individual Time: 1050-1200 PT Individual Time Calculation (min): 70 min    Problem List:  Patient Active Problem List   Diagnosis Date Noted  . Status post craniectomy   . Dysphagia, post-stroke   . Atrial fibrillation ([place])   . Left hemiparesis ([place])   . Cerebral edema ([place]) 11/20/2019  . ICH (intracerebral hemorrhage), SAH ([place]) result of ischemic stroke s/p crani 11/20/2019  . Dysphagia due to recent cerebral infarction 11/20/2019  . Acute blood loss anemia 11/20/2019  . 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 History of ETT   . Hypokalemia   . Acute respiratory failure with hypoxemia ([place]) 11/02/2019  . Cerebrovascular accident (CVA) due to embolism of right middle cerebral artery ([place]) 10/31/2019  . Chronic venous insufficiency 10/07/2019  . Wound of RLE  08/18/2019  . Hyperlipidemia associated with type 2 diabetes mellitus ([place]) 09/25/2016  . Diastolic dysfunction without heart failure 06/02/2015  . Health care maintenance 03/24/2013  . Hypertension associated with diabetes ([place]) 10/16/2011  . Long term current use of anticoagulant therapy 08/27/2010  . DM (diabetes mellitus), type 2, uncontrolled ([place]) 02/11/2008  . MITRAL REGURGITATION - status post MVR 09/14/1999  . S/P MVR (mitral valve repair) 08/09/1999  . Atrial fibrillation with RVR ([place]) 08/03/1999  . S/P placement of cardiac pacemaker 07/18/1999    Past Medical History:  Past Medical History:  Diagnosis Date  . Chronic atrial fibrillation ([place]) 1990s   s/p DCCV then attempted ablation of complex A Flutter (@ [facility] - Dr. Deno Etienne), failed  antiarrhythmics --> INR folllwed @ [facility] ->> now status post pacemaker placement with underlying A. fib.  . Diabetes mellitus type 2, uncontrolled, without complications    On Oral Medications ([place] [facility])  . Essential hypertension   . History of cardiac catheterization 2001   R&LHC - normal Coronaries, no evidence of Restictive Cardiomyopathy or Constrictive Pericarditis (also @ [facility])  . Hx of sick sinus syndrome 07/1999   Wtih symptomatic bradycardia - syncope ([place])  . 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 CARDIAC CATHETERIZATION  08/03/1999   [place] - normal Coronaries; no sign of constriction or restrictive cardiomypathy  . CRANIECTOMY FOR DEPRESSED SKULL FRACTURE Right 11/02/2019   Procedure: DECOMPRESSIVE HEMI -CRANIECTOMY, IMPLANTATION OF SKULL FLAP TO RIGHT ABDOMEN;  Surgeon: Consuella Lose, MD;  Location: [place];  Service: Neurosurgery;  Laterality: Right;  . IR CT HEAD LTD  10/31/2019  . IR PERCUTANEOUS ART THROMBECTOMY/INFUSION INTRACRANIAL INC DIAG ANGIO  10/31/2019      . IR PERCUTANEOUS ART THROMBECTOMY/INFUSION INTRACRANIAL INC DIAG ANGIO  10/31/2019  . MITRAL VALVE REPAIR  07/1999   Both Ant& Post leaflet repair - quadrangular resection&caudal transposition, #32 Sequin Annuloplasty ring  . PACEMAKER GENERATOR CHANGE  11/26/2008  Medtronic Adapta L(? if it has been checked since 02/2013)  . PACEMAKER INSERTION  07/1999   for SSS in setting of Chronic Afib; Medtronic Kappa [id number], [id number].  Marland Kitchen RADIOLOGY WITH ANESTHESIA N/A 10/31/2019    Procedure: IR WITH ANESTHESIA;  Surgeon: Radiologist, Medication, MD;  Location: [place];  Service: Radiology;  Laterality: N/A;  . TRANSTHORACIC ECHOCARDIOGRAM  05/2018   Normal LV size and thickness.  EF estimated 50%.  Inferobasal HK and flattened septum c/w with elevated PA pressures. Mild functional mitral stenosis at rest (postoperative).  Moderate left atrial dilation and mild right atrial dilation.  Mean PA pressures estimated 52 mmHg.    Assessment & Plan Clinical Impression:  Krystal Severa. Little is a 64 year old right-handed female with history of hypertension, diabetes mellitus, chronic atrial fibrillation on Coumadin therapy as well as pacemaker and mitral valve repair.  Per chart review lives with spouse.  1 level home 2 steps to entry.  Independent and active prior to admission.  Presented 10/31/2019 left-sided weakness neglect and right gaze deviation.  Admission chemistries with hemoglobin 14.3, glucose 214, INR 1.2, urine drug screen negative.  Cranial CT scan as well as CT angiogram head and neck showed a large right MCA territory nonhemorrhagic infarction.  Hyperdense right MCA suggesting extensive thrombus from the right ICA terminus to the MCA bifurcation.  Echocardiogram with ejection fraction of 55%.  Patient underwent endovascular thrombectomy revascularization per interventional radiology.  Follow-up imaging showed worsening edema involving the right middle cerebral artery causing significant radiologic mass-effect and patient underwent decompressive right hemicraniectomy placement of bone flap into abdominal subcutaneous pocket 11/02/2019 per Dr. Kathyrn Sheriff.  Patient was cleared to begin subcutaneous Lovenox for DVT prophylaxis 11/09/2019.  CT scan follow-up 11/16/2019 decreased right hemispheric edema and decreased left midline shift.  Initially maintained on Cleviprex for blood pressure control.  Follow cardiology service for atrial fibrillation maintained on Cardizem as well as Lopressor  and her chronic Coumadin has been discontinued at this present time with aspirin 325 mg initiated 11/17/2019 and consideration is being made to resuming anticoagulation 11/21/2019 based on CT results..  Acute blood loss anemia 9.7 and monitored.  Currently maintained on a dysphagia #1 nectar thick liquid diet with calorie counts and nasogastric tube have been in place for nutritional support.  Therapy evaluations completed and patient was admitted for a comprehensive rehab program. Patient transferred to CIR on 11/20/2019 .   Patient currently requires total with mobility secondary to muscle weakness, decreased cardiorespiratoy endurance, impaired timing and sequencing, unbalanced muscle activation, motor apraxia, decreased coordination and decreased motor planning, decreased midline orientation, decreased attention to left and decreased motor planning, decreased initiation, decreased attention, decreased awareness, decreased problem solving, decreased safety awareness, decreased memory and delayed processing and decreased sitting balance, decreased standing balance, decreased postural control, hemiplegia and decreased balance strategies.  Prior to hospitalization, patient was independent  with mobility and lived with Spouse in a House home.  Home access is 2Stairs to enter.  Patient will benefit from skilled PT intervention to maximize safe functional mobility, minimize fall risk and decrease caregiver burden for planned discharge home with 24 hour assist.  Anticipate patient will benefit from follow up [facility] at discharge.  PT - End of Session Activity Tolerance: Decreased this session Endurance Deficit: No PT Assessment Rehab Potential (ACUTE/IP ONLY): Good PT Barriers to Discharge: Inaccessible home environment;Medical stability;Home environment access/layout PT Barriers to Discharge Comments: need to clarify steps/rails PT Patient demonstrates impairments in the following area(s):  Balance;Endurance;Motor;Perception;Safety;Sensory PT Transfers  Functional Problem(s): Bed Mobility;Bed to Chair;Car;Furniture PT Locomotion Functional Problem(s): Ambulation;Wheelchair Mobility;Stairs PT Plan PT Intensity: Minimum of 1-2 x/day ,45 to 90 minutes PT Frequency: 5 out of 7 days PT Duration Estimated Length of Stay: 3.5-4 weeks PT Treatment/Interventions: Ambulation/gait training;Discharge planning;Functional mobility training;Psychosocial support;Therapeutic Activities;Visual/perceptual remediation/compensation;Balance/vestibular training;Disease management/prevention;Neuromuscular re-education;Skin care/wound management;Therapeutic Exercise;Wheelchair propulsion/positioning;Cognitive remediation/compensation;DME/adaptive equipment instruction;Pain management;Splinting/orthotics;UE/LE Strength taining/ROM;Community reintegration;Functional electrical stimulation;Patient/family education;Stair training;UE/LE Coordination activities PT Transfers Anticipated Outcome(s): MinA LRAD PT Locomotion Anticipated Outcome(s): MinA LRAD, S in [facility] PT Recommendation Recommendations for Other Services: Therapeutic Recreation consult Therapeutic Recreation Interventions: Pet therapy;Kitchen group;Stress management;Outing/community reintergration Follow Up Recommendations: Home health PT;24 hour supervision/assistance Patient destination: Home Equipment Recommended: To be determined  Skilled Therapeutic Interventions  Patient received in bed, lethargic today and requiring heavy max stimulation to awaken including tactile stimulation, shaking mattress, cold washcloth, stimulating environment, and sternal rub; continues to demonstrate L hemiplegia and L inattention. Requires 2 helpers for functional bed mobility and fluctuates between Min-MaxA to maintain balance (averaging generally mod assist); able to perform pivot transfer to [place] chair with [place] of 2 people in the stedy. Unable to activate musculature  in L UE however noted slight quad contraction in L quad during session. Worked on Conservation officer, nature of position/posture in [facility] as well as general activation of L LE in [place] chair today, also correcting significant cervical flexion while up in chair. Left positioned to comfort in TIS [facility] with husband providing direct supervision, all needs otherwise met this morning.   PT Evaluation Precautions/Restrictions Precautions Precautions: Fall Precaution Comments: R hemicraniectomy- skull in R abdomen, L shoulder subluxation, L lateral lean and inattention, needs helmet for OOB, L weakness Restrictions Weight Bearing Restrictions: No General Chart Reviewed: Yes Response to Previous Treatment: Patient with no complaints from previous session. Family/Caregiver Present: Yes Vital Signs Pain Pain Assessment Pain Scale: Faces Pain Score: 0-No pain Faces Pain Scale: No hurt Multiple Pain Sites: No Home Living/Prior Functioning Home Living Available Help at Discharge: Family;Available 24 hours/day Type of Home: House Home Access: Stairs to enter [employer] of Steps: 2 Home Layout: One level Bathroom Shower/Tub: Chiropodist: Standard Bathroom Accessibility: Yes  Lives With: Spouse Prior Function Level of Independence: Independent with basic ADLs;Independent with gait;Independent with transfers;Independent with homemaking with ambulation  Able to Take Stairs?: Yes Driving: Yes Vocation: Full time employment Comments: ADLs, IADLs, driving, and enjoys playing basketball (favorite team [employer] basketball and likes Panthers football). Working at [employer] in Therapist, art. Vision/Perception  Vision - Assessment Additional Comments: difficult to assess due to lethargy and mixed command following today Perception Perception: Impaired Inattention/Neglect: Impaired-to be further tested in functional context Praxis Praxis: Impaired Praxis Impairment Details:  Initiation;Motor planning  Cognition Overall Cognitive Status: Impaired/Different from baseline Arousal/Alertness: Lethargic Orientation Level: Oriented to person;Oriented to situation Attention: Focused Focused Attention: Impaired Sustained Attention: Impaired Sustained Attention Impairment: Verbal basic;Functional basic Memory: Impaired Memory Impairment: Decreased recall of new information Awareness: Impaired Awareness Impairment: Intellectual impairment Problem Solving: Impaired Problem Solving Impairment: Functional basic Executive Function: Self Monitoring;Self Correcting Self Monitoring: Impaired Self Monitoring Impairment: Functional basic Self Correcting: Impaired Self Correcting Impairment: Functional basic Safety/Judgment: Impaired Comments: left inattention Sensation Sensation Light Touch: Impaired by gross assessment Hot/Cold: Not tested Proprioception: Impaired by gross assessment Stereognosis: Impaired by gross assessment Additional Comments: most impaired on L UE/LE Coordination Gross Motor Movements are Fluid and Coordinated: No Fine Motor Movements are Fluid and Coordinated: No Finger Nose Finger Test: limited by L hemiplegia Heel Shin Test: limited by L  hemiplegia Motor  Motor Motor: Hemiplegia;Abnormal postural alignment and control Motor - Skilled Clinical Observations: L hemiplegia and abnormal postural alignment- difficulty with cervical extension and strong L lean  Mobility Bed Mobility Bed Mobility: Rolling Right;Rolling Left;Supine to Sit;Sit to Supine Rolling Right: Total Assistance - Patient < 25% Rolling Left: Total Assistance - Patient < 25% Supine to Sit: 2 Helpers Sit to Supine: 2 Helpers Transfers Transfers: Sit to [employer] Sit to Stand: Dependent - mechanical lift;2 Helpers(2 helpers in stedy) Stand Pivot Transfers: Dependent - mechanical lift;2 Helpers(2 helpers in stedy) Transfer (Assistive device): Other  (Comment)(stedy) Transfer via Lift Equipment: Animal nutritionist: No Gait Gait: No Stairs / Additional Locomotion Stairs: No Architect: Yes Wheelchair Assistance: Dependent - Patient 0% Wheelchair Parts Management: Needs assistance Distance: 125f  Trunk/Postural Assessment  Cervical Assessment Cervical Assessment: Exceptions to WFL(head in cervical flexion, difficulty in correcting to neutral position against gravity) Thoracic Assessment Thoracic Assessment: Exceptions to WFL(kyphotic) Lumbar Assessment Lumbar Assessment: Exceptions to WFL(posterior pelvic tilt) Postural Control Postural Control: Deficits on evaluation Head Control: difficulty maintaining neutral positioning of cervical spine- in flexed position and Max cues to correct/unable to maintain for long Trunk Control: fluctutating between Min-maxA, tendency for posterior and L lean Righting Reactions: significantly delayed and at times absent Protective Responses: significantly delayed and at times absent  Balance Balance Balance Assessed: Yes Static Sitting Balance Static Sitting - Level of Assistance: 3: Mod assist Dynamic Sitting Balance Dynamic Sitting - Level of Assistance: 1: +1 Total assist Sitting balance - Comments: fluctuated between min-MaxA, Mod on average- L lateral and posterior lean Static Standing Balance Static Standing - Level of Assistance: 1: +2 Total assist Dynamic Standing Balance Dynamic Standing - Level of Assistance: 1: +2 Total assist Extremity Assessment  RUE Assessment RUE Assessment: Not tested LUE Assessment General Strength Comments: dense hemiplegia RLE Assessment RLE Assessment: Within Functional Limits LLE Assessment LLE Assessment: Exceptions to [facility] Strength Comments: hemiplegic but trace activations of quad noted- perhaps 2-/5, otherwise unable to active L LE    Refer to Care Plan for Long Term  Goals  Recommendations for other services: Therapeutic Recreation  Pet therapy, Kitchen group, Stress management and Outing/community reintegration  Discharge Criteria: Patient will be discharged from PT if patient refuses treatment 3 consecutive times without medical reason, if treatment goals not met, if there is a change in medical status, if patient makes no progress towards goals or if patient is discharged from hospital.  The above assessment, treatment plan, treatment alternatives and goals were discussed and mutually agreed upon: by patient and by family  KAnn LionsPT, DPT, PN1   Supplemental Physical Therapist [place]   Pager [phone] Rehab Office [phone]

## 2019-11-21 NOTE — Progress Notes (Signed)
Patient information reviewed and entered into eRehab System by Becky Eliabeth Shoff, PPS coordinator. Information including medical coding, function ability, and quality indicators will be reviewed and updated through discharge.   

## 2019-11-21 NOTE — Care Management (Signed)
[facility] Individual Statement of Services  Patient Name:  Krystal Little  Date:  11/21/2019  Welcome to the [facility].  Our goal is to provide you with an individualized program based on your diagnosis and situation, designed to meet your specific needs.  With this comprehensive rehabilitation program, you will be expected to participate in at least 3 hours of rehabilitation therapies Monday-Friday, with modified therapy programming on the weekends.  Your rehabilitation program will include the following services:  Physical Therapy (PT), Occupational Therapy (OT), Speech Therapy (ST), 24 hour per day rehabilitation nursing, Therapeutic Recreaction (TR), Neuropsychology, Case Management (Social Worker), Rehabilitation Medicine, Nutrition Services, Pharmacy Services and Other  Weekly team conferences will be held on Wednesdays to discuss your progress.  Your Social Worker will talk with you frequently to get your input and to update you on team discussions.  Team conferences with you and your family in attendance may also be held.  Expected length of stay: 2-3Weeks  Overall anticipated outcome: Supervision  Depending on your progress and recovery, your program may change. Your Social Worker will coordinate services and will keep you informed of any changes. Your Social Worker's name and contact numbers are listed  below.  The following services may also be recommended but are not provided by the [place]:    [place] will be made to provide these services after discharge if needed.  Arrangements include referral to agencies that provide these services.  Your insurance has been verified to be:  [employer] Your primary doctor is:  Stark Klein, MD  Pertinent information will be shared with your doctor and your insurance  company.  Social Worker:  Erlene Quan, [place] or ([phone]   Information discussed with and copy given to patient by: Dyanne Iha, 11/21/2019, 12:03 PM

## 2019-11-21 NOTE — Evaluation (Signed)
Occupational Therapy Assessment and Plan  Patient Details  Name: Krystal Little MRN: [id number] Date of Birth: Oct 13, 1955  OT Diagnosis: abnormal posture, apraxia, cognitive deficits, disturbance of vision, flaccid hemiplegia and hemiparesis and hemiplegia affecting non-dominant side Rehab Potential: Rehab Potential (ACUTE ONLY): Fair ELOS: 28-30 days   Today's Date: 11/21/2019 OT Individual Time: 1300-1400 OT Individual Time Calculation (min): 60 min     Problem List:  Patient Active Problem List   Diagnosis Date Noted  . Status post craniectomy   . Dysphagia, post-stroke   . Atrial fibrillation ([place])   . Left hemiparesis ([place])   . Cerebral edema ([place]) 11/20/2019  . ICH (intracerebral hemorrhage), SAH ([place]) result of ischemic stroke s/p crani 11/20/2019  . Dysphagia due to recent cerebral infarction 11/20/2019  . Acute blood loss anemia 11/20/2019  . Right middle cerebral artery stroke ([place]) 11/20/2019  . History of ETT   . Hypokalemia   . Acute respiratory failure with hypoxemia ([place]) 11/02/2019  . Cerebrovascular accident (CVA) due to embolism of right middle cerebral artery ([place]) 10/31/2019  . Chronic venous insufficiency 10/07/2019  . Wound of RLE  08/18/2019  . Hyperlipidemia associated with type 2 diabetes mellitus ([place]) 09/25/2016  . Diastolic dysfunction without heart failure 06/02/2015  . Health care maintenance 03/24/2013  . Hypertension associated with diabetes ([place]) 10/16/2011  . Long term current use of anticoagulant therapy 08/27/2010  . DM (diabetes mellitus), type 2, uncontrolled ([place]) 02/11/2008  . MITRAL REGURGITATION - status post MVR 09/14/1999  . S/P MVR (mitral valve repair) 08/09/1999  . Atrial fibrillation with RVR ([place]) 08/03/1999  . S/P placement of cardiac pacemaker 07/18/1999    Past Medical History:  Past Medical History:  Diagnosis Date  . Chronic atrial fibrillation ([place]) 1990s   s/p DCCV then attempted ablation of complex A Flutter (@ [facility] - Dr. Deno Etienne), failed antiarrhythmics --> INR folllwed @ [facility] FP ->> now status post pacemaker placement with underlying A. fib.  . Diabetes mellitus type 2, uncontrolled, without complications    On Oral Medications ([place] [facility])  . Essential hypertension   . History of cardiac catheterization 2001   R&LHC - normal Coronaries, no evidence of Restictive Cardiomyopathy or Constrictive Pericarditis (also @ [facility])  . Hx of sick sinus syndrome 07/1999   Wtih symptomatic bradycardia - syncope (Tachy-Brady)  . 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 CARDIAC CATHETERIZATION  08/03/1999   [place] - normal Coronaries; no sign of constriction or restrictive cardiomypathy  . CRANIECTOMY FOR DEPRESSED SKULL FRACTURE Right 11/02/2019   Procedure: DECOMPRESSIVE HEMI -CRANIECTOMY, IMPLANTATION OF SKULL FLAP TO RIGHT ABDOMEN;  Surgeon: Consuella Lose, MD;  Location: [place];  Service: Neurosurgery;  Laterality: Right;  . IR CT HEAD LTD  10/31/2019  . IR PERCUTANEOUS ART THROMBECTOMY/INFUSION INTRACRANIAL INC DIAG ANGIO  10/31/2019      . IR PERCUTANEOUS ART THROMBECTOMY/INFUSION INTRACRANIAL INC DIAG ANGIO  10/31/2019  . MITRAL VALVE REPAIR  07/1999   Both Ant& Post leaflet repair - quadrangular resection&caudal transposition, #32 Sequin Annuloplasty ring  .  PACEMAKER GENERATOR CHANGE  11/26/2008   Medtronic Adapta L(? if it has been checked since 02/2013)  . PACEMAKER INSERTION  07/1999   for SSS in setting of Chronic Afib; Medtronic Kappa [id number], [id number] H.  Marland Kitchen RADIOLOGY  WITH ANESTHESIA N/A 10/31/2019   Procedure: IR WITH ANESTHESIA;  Surgeon: Radiologist, Medication, MD;  Location: [place];  Service: Radiology;  Laterality: N/A;  . TRANSTHORACIC ECHOCARDIOGRAM  05/2018   Normal LV size and thickness.  EF estimated 50%.  Inferobasal HK and flattened septum c/w with elevated PA pressures. Mild functional mitral stenosis at rest (postoperative).  Moderate left atrial dilation and mild right atrial dilation.  Mean PA pressures estimated 52 mmHg.    Assessment & Plan Clinical Impression:  Krystal Little. Krystal Little is a 64 year old right-handed female with history of hypertension, diabetes mellitus, chronic atrial fibrillation on Coumadin therapy as well as pacemaker and mitral valve repair. Per chart review lives with spouse. 1 level home 2 steps to entry. Independent and active prior to admission. Presented 10/31/2019 left-sided weakness neglect and right gaze deviation. Admission chemistries with hemoglobin 14.3, glucose 214, INR 1.2, urine drug screen negative. Cranial CT scan as well as CT angiogram head and neck showed a large right MCA territory nonhemorrhagic infarction. Hyperdense right MCA suggesting extensive thrombus from the right ICA terminus to the MCA bifurcation. Echocardiogram with ejection fraction of 55%. Patient underwent endovascular thrombectomy revascularization per interventional radiology. Follow-up imaging showed worsening edema involving the right middle cerebral artery causing significant radiologic mass-effect and patient underwent decompressive right hemicraniectomy placement of bone flap into abdominal subcutaneous pocket 11/02/2019 per Dr. Kathyrn Sheriff. Patient was cleared to begin subcutaneous Lovenox for DVT prophylaxis 11/09/2019. CT scan follow-up 11/16/2019 decreased right hemispheric edema and decreased left midline shift. Initially maintained on Cleviprex for blood pressure control. Follow cardiology service for atrial fibrillation maintained on Cardizem as  well as Lopressor and her chronic Coumadin has been discontinued at this present time with aspirin 325 mg initiated 11/17/2019 and consideration is being made to resuming anticoagulation 11/21/2019 based on CT results.. Acute blood loss anemia 9.7 and monitored. Currently maintained on a dysphagia #1 nectar thick liquid diet with calorie counts and nasogastric tube have been in place for nutritional support. Therapy evaluations completed and patient was admitted for a comprehensive rehab program.   Patient transferred to CIR on 11/20/2019 .    Patient currently requires total with basic self-care skills secondary to muscle weakness, decreased cardiorespiratoy endurance, abnormal tone, decreased visual acuity, decreased visual perceptual skills, decreased visual motor skills and hemianopsia, left side neglect, decreased initiation, decreased attention, decreased awareness, decreased problem solving, decreased memory and delayed processing and decreased sitting balance, decreased standing balance, decreased postural control and hemiplegia.  Prior to hospitalization, patient could was fully independent.  Patient will benefit from skilled intervention to increase independence with basic self-care skills prior to discharge home with care partner.  Anticipate patient will require minimal physical assistance and follow up home health.  OT - End of Session Activity Tolerance: Tolerates < 10 min activity, no significant change in vital signs Endurance Deficit: Yes OT Assessment Rehab Potential (ACUTE ONLY): Fair OT Patient demonstrates impairments in the following area(s): Balance;Endurance;Motor;Perception;Safety;Cognition;Sensory;Vision OT Basic ADL's Functional Problem(s): Eating;Grooming;Bathing;Dressing;Toileting OT Transfers Functional Problem(s): Toilet;Tub/Shower OT Additional Impairment(s): Fuctional Use of Upper Extremity OT Plan OT Intensity: Minimum of 1-2 x/day, 45 to 90 minutes OT Frequency: 5 out  of 7 days OT Duration/Estimated Length of Stay: 28-30 days OT Treatment/Interventions: Balance/vestibular training;Cognitive remediation/compensation;Discharge planning;DME/adaptive equipment instruction;Functional  mobility training;Neuromuscular re-education;Functional electrical stimulation;Patient/family education;Psychosocial support;Therapeutic Activities;Splinting/orthotics;Self Care/advanced ADL retraining;Therapeutic Exercise;UE/LE Strength taining/ROM;UE/LE Coordination activities;Visual/perceptual remediation/compensation OT Self Feeding Anticipated Outcome(s): supervision OT Basic Self-Care Anticipated Outcome(s): Min A OT Toileting Anticipated Outcome(s): Min A OT Bathroom Transfers Anticipated Outcome(s): Min A OT Recommendation Patient destination: Home Follow Up Recommendations: Home health OT Equipment Recommended: Tub/shower bench;3 in 1 bedside comode   Skilled Therapeutic Intervention Pt seen for initial evaluation and mobility training with a focus on safe movement with her LUE and postural control.  At start of session, nurse tech needed A to transfer pt from w/c to bed as transport arrived to take pt to CT scan.  Performed part of evaluation during this time. Used stedy lift with total A of 2 as pt needed A to support L arm and postural control as she could not hold head or trunk upright.  Transferred to bed and then we realized bed was wet, so pt had to rise to stand in stedy 2nd time while bed was changed with total A to support balance in stedy.   From EOB to supine, total A of 2.  Rolling with max A and max VC to doff wet brief, cleanse pt and don new brief and hospital gown.   Transport took pt to CT scan for 20 minutes, during this time OT obtained education sheets for family on safe positioning of hemiplegic arm and also provided education to husband on decreasing stimulation (tv, phone conversations) during therapy and while pt is eating.  Discussed subluxation and  precautions to take.   Pt arrived back and placed resting hand splint on her L hand. Daughter also arrived.  Educated family about use of resting hand splint at night and wrist cock up during the day and use of a sling for transfers.   Had pt work on visual scanning to her left.  Pt with great difficulty as she has misalignment of her eyes.  Daughter will bring in patient's glasses so we can work on patching of a lense.   PROM to LUE with good scapular gliding.  Demonstrated how to postion her arm with pillows in the bed.   Discussed role of OT, OT POC, pt's goals with husband and daughter. Pt in bed with all needs met and bed alarm set.     OT Evaluation Precautions/Restrictions  Precautions Precautions: Fall Precaution Comments: R hemicraniectomy- skull in R abdomen, L shoulder subluxation, L lateral lean and inattention, needs helmet for OOB, L weakness Restrictions Weight Bearing Restrictions: No  Therapy Vitals Temp: 99 F (37.2 C) Temp Source: Oral Pulse Rate: 94 Resp: 17 BP: 137/77 Patient Position (if appropriate): Lying Oxygen Therapy SpO2: 99 % O2 Device: Room Air Pain Pain Assessment Pain Scale: Faces Pain Score: 0-No pain Faces Pain Scale: No hurt Multiple Pain Sites: No Home Living/Prior Functioning Home Living Family/patient expects to be discharged to:: Private residence Living Arrangements: Spouse/significant other, Children, Other relatives Available Help at Discharge: Family, Available 24 hours/day Type of Home: House Home Access: Stairs to enter [employer] of Steps: 2 Home Layout: One level Bathroom Shower/Tub: Optometrist: Yes  Lives With: Spouse Prior Function Level of Independence: Independent with basic ADLs, Independent with gait, Independent with transfers, Independent with homemaking with ambulation  Able to Take Stairs?: Yes Driving: Yes Vocation: Full time  employment Comments: ADLs, IADLs, driving, and enjoys playing basketball (favorite team [employer] basketball and likes [employer] football). Working at [employer] in Therapist, art. ADL  total A Vision Baseline Vision/History: Wears glasses Wears Glasses: At all times Vision Assessment?: Yes Eye Alignment: Impaired (comment)(R eye diverged to R, L eye only tracks from midline to R) Ocular Range of Motion: Restricted on the left Alignment/Gaze Preference: Gaze right Tracking/Visual Pursuits: Unable to hold eye position out of midline Convergence: Impaired (comment) Visual Fields: (unable to formally assess, but appears to have a 90 degree field cut) Diplopia Assessment: Other (comment)(pt does not report diplopia, but she keeps her R eye shut) Additional Comments: difficult to assess due to lethargy and mixed command following today Perception  Perception: Impaired Inattention/Neglect: Impaired-to be further tested in functional context Praxis Praxis: Impaired Praxis Impairment Details: Initiation;Motor planning Cognition Overall Cognitive Status: Impaired/Different from baseline Arousal/Alertness: Lethargic Orientation Level: Person;Place Year: 2021 Month: May Day of Week: Correct Memory: Impaired Memory Impairment: Decreased recall of new information Immediate Memory Recall: (unable to recall, very lethargic) Attention: Focused Focused Attention: Impaired Sustained Attention: Impaired Sustained Attention Impairment: Verbal basic;Functional basic Awareness: Impaired Awareness Impairment: Intellectual impairment Problem Solving: Impaired Problem Solving Impairment: Functional basic Executive Function: Self Monitoring;Self Correcting Self Monitoring: Impaired Self Monitoring Impairment: Functional basic Self Correcting: Impaired Self Correcting Impairment: Functional basic Safety/Judgment: Impaired Comments: left inattention Sensation Sensation Light Touch: (should could  tell that her elbow was being touched) Hot/Cold: Not tested Proprioception: Impaired by gross assessment Stereognosis: Impaired by gross assessment Additional Comments: most impaired on L UE/LE Coordination Gross Motor Movements are Fluid and Coordinated: No Fine Motor Movements are Fluid and Coordinated: No Finger Nose Finger Test: no active LUE movement Heel Shin Test: limited by L hemiplegia Motor  Motor Motor: Hemiplegia;Abnormal postural alignment and control Motor - Skilled Clinical Observations: L hemiplegia and abnormal postural alignment- difficulty with cervical extension and strong L lean Mobility  Bed Mobility Bed Mobility: Rolling Right;Rolling Left;Supine to Sit;Sit to Supine Rolling Right: Total Assistance - Patient < 25% Rolling Left: Total Assistance - Patient < 25% Supine to Sit: 2 Helpers Sit to Supine: 2 Helpers Transfers Sit to Stand: Dependent - mechanical lift;2 Helpers(2 helpers in stedy)  Trunk/Postural Assessment  Cervical Assessment Cervical Assessment: Exceptions to WFL(head in cervical flexion, difficulty in correcting to neutral position against gravity) Thoracic Assessment Thoracic Assessment: Exceptions to WFL(kyphotic) Lumbar Assessment Lumbar Assessment: Exceptions to WFL(posterior pelvic tilt) Postural Control Postural Control: Deficits on evaluation Head Control: difficulty maintaining neutral positioning of cervical spine- in flexed position and Max cues to correct/unable to maintain for long Trunk Control: fluctutating between Min-maxA, tendency for posterior and L lean Righting Reactions: significantly delayed and at times absent Protective Responses: significantly delayed and at times absent  Balance Balance Balance Assessed: Yes Static Sitting Balance Static Sitting - Level of Assistance: 1: +1 Total assist Dynamic Sitting Balance Dynamic Sitting - Level of Assistance: 1: +1 Total assist Sitting balance - Comments: fluctuated between  min-MaxA, Mod on average- L lateral and posterior lean Static Standing Balance Static Standing - Level of Assistance: 1: +2 Total assist Dynamic Standing Balance Dynamic Standing - Level of Assistance: 1: +2 Total assist Extremity/Trunk Assessment RUE Assessment RUE Assessment: Not tested General Strength Comments: difficult to assess due to lethargy, appears to have functional range and strength LUE Assessment General Strength Comments: dense hemiplegia LUE Body System: Neuro Brunstrum levels for arm and hand: Arm;Hand Brunstrum level for arm: Stage I Presynergy Brunstrum level for hand: Stage I Flaccidity LUE Tone LUE Tone: Flaccid     Refer to Care Plan for Long Term Goals  Recommendations for other services:  None    Discharge Criteria: Patient will be discharged from OT if patient refuses treatment 3 consecutive times without medical reason, if treatment goals not met, if there is a change in medical status, if patient makes no progress towards goals or if patient is discharged from hospital.  The above assessment, treatment plan, treatment alternatives and goals were discussed and mutually agreed upon: by family  [place] 11/21/2019, 3:53 PM

## 2019-11-21 NOTE — Progress Notes (Signed)
Social Work Assessment and Plan   Patient Details  Name: Krystal Little MRN: [id number] Date of Birth: 1956-03-08  Today's Date: 11/21/2019  Problem List:  Patient Active Problem List   Diagnosis Date Noted  . Status post craniectomy   . Dysphagia, post-stroke   . Atrial fibrillation ([place])   . Left hemiparesis ([place])   . Cerebral edema ([place]) 11/20/2019  . ICH (intracerebral hemorrhage), SAH ([place]) result of ischemic stroke s/p crani 11/20/2019  . Dysphagia due to recent cerebral infarction 11/20/2019  . Acute blood loss anemia 11/20/2019  . 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 History of ETT   . Hypokalemia   . Acute respiratory failure with hypoxemia ([place]) 11/02/2019  . Cerebrovascular accident (CVA) due to embolism of right middle cerebral artery ([place]) 10/31/2019  . Chronic venous insufficiency 10/07/2019  . Wound of RLE  08/18/2019  . Hyperlipidemia associated with type 2 diabetes mellitus ([place]) 09/25/2016  . Diastolic dysfunction without heart failure 06/02/2015  . Health care maintenance 03/24/2013  . Hypertension associated with diabetes ([place]) 10/16/2011  . Long term current use of anticoagulant therapy 08/27/2010  . DM (diabetes mellitus), type 2, uncontrolled ([place]) 02/11/2008  . MITRAL REGURGITATION - status post MVR 09/14/1999  . S/P MVR (mitral valve repair) 08/09/1999  . Atrial fibrillation with RVR ([place]) 08/03/1999  . S/P placement of cardiac pacemaker 07/18/1999   Past Medical History:  Past Medical History:  Diagnosis Date  . Chronic atrial fibrillation ([place]) 1990s   s/p DCCV then attempted ablation of complex A Flutter (@ [facility] - Dr. Deno Etienne), failed antiarrhythmics --> INR folllwed @ [facility] FP ->> now status post pacemaker placement with underlying A. fib.  . Diabetes mellitus type 2, uncontrolled, without complications    On Oral Medications ([place] [facility])  . Essential hypertension   . History of cardiac catheterization 2001   R&LHC - normal  Coronaries, no evidence of Restictive Cardiomyopathy or Constrictive Pericarditis (also @ [facility])  . Hx of sick sinus syndrome 07/1999   Wtih symptomatic bradycardia - syncope (Tachy-Brady)  . 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 CARDIAC CATHETERIZATION  08/03/1999   [place] - normal Coronaries; no sign of constriction or restrictive cardiomypathy  . CRANIECTOMY FOR DEPRESSED SKULL FRACTURE Right 11/02/2019   Procedure: DECOMPRESSIVE HEMI -CRANIECTOMY, IMPLANTATION OF SKULL FLAP TO RIGHT ABDOMEN;  Surgeon: Consuella Lose, MD;  Location: [place];  Service: Neurosurgery;  Laterality: Right;  . IR CT HEAD LTD  10/31/2019  . IR PERCUTANEOUS ART THROMBECTOMY/INFUSION INTRACRANIAL INC DIAG ANGIO  10/31/2019      . IR PERCUTANEOUS ART THROMBECTOMY/INFUSION INTRACRANIAL INC DIAG ANGIO  10/31/2019  . MITRAL VALVE REPAIR  07/1999   Both Ant& Post leaflet repair - quadrangular resection&caudal transposition, #32 Sequin Annuloplasty ring  . PACEMAKER GENERATOR CHANGE  11/26/2008   Medtronic Adapta L(? if it has been checked since 02/2013)  . PACEMAKER INSERTION  07/1999   for SSS in setting of Chronic Afib; Medtronic Kappa [id number], [id number] H.  Marland Kitchen RADIOLOGY WITH ANESTHESIA N/A 10/31/2019  Procedure: IR WITH ANESTHESIA;  Surgeon: Radiologist, Medication, MD;  Location: [place];  Service: Radiology;  Laterality: N/A;  . TRANSTHORACIC ECHOCARDIOGRAM  05/2018   Normal LV size and thickness.  EF estimated 50%.  Inferobasal HK and flattened septum c/w with elevated PA pressures.  Mild functional mitral stenosis at rest (postoperative).  Moderate left atrial dilation and mild right atrial dilation.  Mean PA pressures estimated 52 mmHg.   Social History:  reports that she has never smoked. She has never used smokeless tobacco. She reports current alcohol use. She reports that she does not use drugs.  Family / Support Systems Patient Roles: Spouse Spouse/Significant Other: Krystal Little Children: Nephew 108 and Daughter 58, Lives with patient and spouse Anticipated Caregiver: Spouse, Daughter and nephew (has not decided who will take on role primary) Ability/Limitations of Caregiver: none Caregiver Availability: 24/7  Social History Preferred language: English Religion: Baptist Cultural Background: Worked on farm Education: BA Read: Yes Write: Yes Employment Status: Disabled   Abuse/Neglect Abuse/Neglect Assessment Can Be Completed: Yes Physical Abuse: Denies Verbal Abuse: Denies Sexual Abuse: Denies Exploitation of patient/patient's resources: Denies Self-Neglect: Denies  Emotional Status Pt's affect, behavior and adjustment status: no Recent Psychosocial Issues: no Psychiatric History: no Substance Abuse History: no  Patient / Family Perceptions, Expectations & Goals Pt/Family understanding of illness & functional limitations: yes Pt/family expectations/goals: goal to discharge home with spouse  [employer]: None Transportation available at discharge: Spouse able to transport at discharge  Discharge Planning Living Arrangements: Spouse/significant other, Children, Other relatives Support Systems: Spouse/significant other, Children, Other relatives Type of Residence: Private residence(1 Level home 4-5 Steps to enter, no rails. No steps on back entrance. Bathroom walking distance from bedroom) Insurance Resources: Multimedia programmer (specify)([employer]) Financial Screen Referred: Yes Living Expenses: Own Money  Management: Patient, Spouse Does the patient have any problems obtaining your medications?: No Sw Barriers to Discharge: Medical stability Sw Barriers to Discharge Comments: SW not aware of any barriers currently Social Work Anticipated Follow Up Needs: [place] Additional Notes/Comments: Spouse goal is for patient to dischrage home independent Expected length of stay: 2-3 Weeks  Clinical Impression SW entered room patient sitting up in [facility], spouse at bedside. Sw introduced self, explained role and process. Patient lunch arrived and also getting fitted for new wrist brace. SW completed assessment with spouse. Spouse wants to ensure goals are patient discharge home independently and this is his only focus. Sw will continue to follow up with questions or concerns.  Dyanne Iha 11/21/2019, 12:45 PM

## 2019-11-21 NOTE — Progress Notes (Signed)
Initial Nutrition Assessment  DOCUMENTATION CODES:   Not applicable  INTERVENTION:   Nocturnal tube feeds via Cortrak: - Glucerna 1.5 @ 50 ml/hr to run for 10 hours from 2000 to 0600 (total of 500 ml)  Nocturnal tube feeding regimen provides 750 kcal, 41 grams of protein, and 380 ml of H2O (48% of kcal needs, 55% of protein needs).  - Vital Cuisine Shake TID, each supplement provides 520 kcal and 22 grams of protein  - Pro-stat 30 ml po BID, each supplement provides 100 kcal and 15 grams of protein  - Continue feeding assistance with meals  - Continue MVI with minerals daily  - Magic Cup TID with meals, each supplement provides 290 kcal and 9 grams of protein  - Encourage adequate PO intake  NUTRITION DIAGNOSIS:   Inadequate oral intake related to dysphagia as evidenced by meal completion < 50%, per patient/family report.  GOAL:   Patient will meet greater than or equal to 90% of their needs  MONITOR:   PO intake, Supplement acceptance, Diet advancement, Labs, Weight trends, TF tolerance, Skin  REASON FOR ASSESSMENT:   Consult Enteral/tube feeding initiation and management  ASSESSMENT:   64 year old female with PMH of HTN, DM, chronic atrial fibrillation. Presented 10/31/19 with left-sided weakness, neglect, and right gaze deviation. Cranial CT scan as well as CT angiogram head and neck showed a large right MCA territory nonhemorrhagic infarction. Hyperdense right MCA suggesting extensive thrombus from the right ICA terminus to the MCA bifurcation. Pt underwent endovascular thrombectomy revascularization per IR. Follow-up imaging showed worsening edema involving the right middle cerebral artery causing significant radiologic mass-effect and pt underwent decompressive right hemicraniectomy placement of bone flap into abdominal subcutaneous pocket on 11/02/19. Currently maintained on a dysphagia 1 diet with nectar-thick liquids and nasogastric tube feeds for nutritional  support. Admitted to CIR on 5/06.   Spoke with pt's family members at bedside. Pt's family members state that pt was never "a big eater." They had not noticed any changes in her appetite recently. They report pt consumed a lot of vegetables and chicken and had recently started trying to eat healthier by cutting out fast food. They report pt had lost a little weight recently due to healthy diet changes and walking more.  Reviewed weight history in chart. Pt's weight trending up over the last few months.  Noted breakfast and lunch meal trays at bedside. Pt had completed ~25% of each meal tray.  Discussed plan for nocturnal tube feeds with RN and with pt and family.  Medications reviewed and include: Pro-stat BID, Novolog 8 units BID, Lantus 20 units BID, protonix, miralax, senna  Labs reviewed. CBG's: 77-287 x 24 hours  NUTRITION - FOCUSED PHYSICAL EXAM:    Most Recent Value  Orbital Region  No depletion  Upper Arm Region  No depletion  Thoracic and Lumbar Region  No depletion  Buccal Region  No depletion  Temple Region  No depletion  Clavicle Bone Region  Mild depletion  Clavicle and Acromion Bone Region  Mild depletion  Scapular Bone Region  No depletion  Dorsal Hand  No depletion  Patellar Region  No depletion  Anterior Thigh Region  Mild depletion  Posterior Calf Region  Mild depletion  Edema (RD Assessment)  Mild [BLE]  Hair  Reviewed  Eyes  Reviewed  Mouth  Reviewed  Skin  Reviewed  Nails  Reviewed       Diet Order:   Diet Order  DIET - DYS 1 Room service appropriate? Yes; Fluid consistency: Nectar Thick  Diet effective now              EDUCATION NEEDS:   No education needs have been identified at this time  Skin:  Skin Assessment: Skin Integrity Issues: Incisions: incision to groin, head, abdomen Other: pretibial venous stasis ulcer  Last BM:  11/20/19  Height:   Ht Readings from Last 1 Encounters:  11/20/19 5\' 4"  (1.626 m)    Weight:    Wt Readings from Last 1 Encounters:  11/21/19 67.5 kg    Ideal Body Weight:  54.5 kg  BMI:  Body mass index is 25.54 kg/m.  Estimated Nutritional Needs:   Kcal:  1550-1750  Protein:  75-90 grams  Fluid:  >/= 1.6 L    Gaynell Face, MS, RD, LDN Inpatient Clinical Dietitian Pager: [phone] Weekend/After Hours: [phone]

## 2019-11-21 NOTE — Evaluation (Signed)
Speech Language Pathology Assessment and Plan  Patient Details  Name: Krystal Little MRN: [id number] Date of Birth: [birth year]  SLP Diagnosis: Dysphagia;Dysarthria;Cognitive Impairments  Rehab Potential: Good ELOS: 21 days    Today's Date: 11/21/2019 SLP Individual Time: 0804-0900 SLP Individual Time Calculation (min): 56 min   Problem List:  Patient Active Problem List   Diagnosis Date Noted  . Status post craniectomy   . Dysphagia, post-stroke   . Atrial fibrillation ([place])   . Left hemiparesis ([place])   . Cerebral edema ([place]) 11/20/2019  . ICH (intracerebral hemorrhage), SAH ([place]) result of ischemic stroke s/p crani 11/20/2019  . Dysphagia due to recent cerebral infarction 11/20/2019  . Acute blood loss anemia 11/20/2019  . 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 History of ETT   . Hypokalemia   . Acute respiratory failure with hypoxemia ([place]) 11/02/2019  . Cerebrovascular accident (CVA) due to embolism of right middle cerebral artery ([place]) 10/31/2019  . Chronic venous insufficiency 10/07/2019  . Wound of RLE  08/18/2019  . Hyperlipidemia associated with type 2 diabetes mellitus ([place]) 09/25/2016  . Diastolic dysfunction without heart failure 06/02/2015  . Health care maintenance 03/24/2013  . Hypertension associated with diabetes ([place]) 10/16/2011  . Long term current use of anticoagulant therapy 08/27/2010  . DM (diabetes mellitus), type 2, uncontrolled ([place]) 02/11/2008  . MITRAL REGURGITATION - status post MVR 09/14/1999  . S/P MVR (mitral valve repair) 08/09/1999  . Atrial fibrillation with RVR ([place]) 08/03/1999  . S/P placement of cardiac pacemaker 07/18/1999   Past Medical History:  Past Medical History:  Diagnosis Date  . Chronic atrial fibrillation ([place]) 1990s   s/p DCCV then attempted ablation of complex A Flutter (@ [facility] - Dr. Deno Etienne), failed antiarrhythmics --> INR folllwed @ [facility] ->> now status post pacemaker placement with underlying A.  fib.  . Diabetes mellitus type 2, uncontrolled, without complications    On Oral Medications ([place] [facility])  . Essential hypertension   . History of cardiac catheterization 2001   R&LHC - normal Coronaries, no evidence of Restictive Cardiomyopathy or Constrictive Pericarditis (also @ [facility])  . Hx of sick sinus syndrome 07/1999   Wtih symptomatic bradycardia - syncope ([place])  . 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 CARDIAC CATHETERIZATION  08/03/1999   [place] - normal Coronaries; no sign of constriction or restrictive cardiomypathy  . CRANIECTOMY FOR DEPRESSED SKULL FRACTURE Right 11/02/2019   Procedure: DECOMPRESSIVE HEMI -CRANIECTOMY, IMPLANTATION OF SKULL FLAP TO RIGHT ABDOMEN;  Surgeon: Consuella Lose, MD;  Location: [facility];  Service: Neurosurgery;  Laterality: Right;  . IR CT HEAD LTD  10/31/2019  . IR PERCUTANEOUS ART THROMBECTOMY/INFUSION INTRACRANIAL INC DIAG ANGIO  10/31/2019      . IR PERCUTANEOUS ART THROMBECTOMY/INFUSION INTRACRANIAL INC DIAG ANGIO  10/31/2019  . MITRAL VALVE REPAIR  07/1999   Both Ant& Post leaflet repair - quadrangular resection&caudal transposition, #32 Sequin Annuloplasty ring  . PACEMAKER GENERATOR CHANGE  11/26/2008   Medtronic Adapta L(? if it has been checked since 02/2013)  .  PACEMAKER INSERTION  07/1999   for SSS in setting of Chronic Afib; Medtronic Kappa [id number], [id number].  Marland Kitchen RADIOLOGY WITH ANESTHESIA N/A 10/31/2019   Procedure: IR WITH ANESTHESIA;  Surgeon: Radiologist, Medication, MD;  Location: [facility];   Service: Radiology;  Laterality: N/A;  . TRANSTHORACIC ECHOCARDIOGRAM  05/2018   Normal LV size and thickness.  EF estimated 50%.  Inferobasal HK and flattened septum c/w with elevated PA pressures. Mild functional mitral stenosis at rest (postoperative).  Moderate left atrial dilation and mild right atrial dilation.  Mean PA pressures estimated 52 mmHg.    Assessment / Plan / Recommendation Clinical Impression   Krystal Little is a 64 year old right-handed female with history of hypertension, diabetes mellitus, chronic atrial fibrillation on Coumadin therapy as well as pacemaker and mitral valve repair.  Per chart review lives with spouse.  1 level home 2 steps to entry.  Independent and active prior to admission.  Presented 10/31/2019 left-sided weakness neglect and right gaze deviation.  Admission chemistries with hemoglobin 14.3, glucose 214, INR 1.2, urine drug screen negative.  Cranial CT scan as well as CT angiogram head and neck showed a large right MCA territory nonhemorrhagic infarction.  Hyperdense right MCA suggesting extensive thrombus from the right ICA terminus to the MCA bifurcation.  Echocardiogram with ejection fraction of 55%.  Patient underwent endovascular thrombectomy revascularization per interventional radiology.  Follow-up imaging showed worsening edema involving the right middle cerebral artery causing significant radiologic mass-effect and patient underwent decompressive right hemicraniectomy placement of bone flap into abdominal subcutaneous pocket 11/02/2019 per Dr. Kathyrn Sheriff.  Patient was cleared to begin subcutaneous Lovenox for DVT prophylaxis 11/09/2019.  CT scan follow-up 11/16/2019 decreased right hemispheric edema and decreased left midline shift.  Initially maintained on Cleviprex for blood pressure control.  Follow cardiology service for atrial fibrillation maintained on Cardizem as well as Lopressor and her chronic Coumadin has been discontinued at this present time with  aspirin 325 mg initiated 11/17/2019 and consideration is being made to resuming anticoagulation 11/21/2019 based on CT results..  Acute blood loss anemia 9.7 and monitored.  Currently maintained on a dysphagia #1 nectar thick liquid diet with calorie counts and nasogastric tube have been in place for nutritional support.  Therapy evaluations completed and patient was admitted for a comprehensive rehab program.  SLP evaluation was completed on 11/21/2019 with results as follows:   Bedside swallow evaluation  Pt presents with moderate left sided oral weakness which lead to a prolonged oral phase and anterior spillage of pureed boluses from the cavity for which pt required mod assist verbal cues to correct.  Use of a straw prevented any spillage with nectar thick liquids.  No overt s/s of aspiration were evident with purees or thickened liquids.  I would recommend that pt continue on her currently prescribed diet with focus on trials of thin liquids and toleration of current solid textures in future therapy sessions.    Cognitive-linguistic evaluation Pt additionally presents with moderate cognitive deficits with the most impactful being left inattention and decreased sustained attention to tasks.  As a result pt required max to total assist for visual scanning to midline and basic, familiar problem solving.  Furthermore, pt also presents with a moderate dysarthria resulting from imprecise articulation of consonants in the setting of oral motor weakness, decreased vocal intensity, and rapid rushes of speech which impact her intelligibility at the phrase level.    Given the abovementioned deficits, pt would benefit from skilled ST while inpatient in order  to maximize functional independence and reduce burden of care prior to discharge.  Anticipate that pt will need 24/7 supervision at discharge in addition to [place] follow up at next level of care.      Skilled Therapeutic Interventions          Cognitive-linguistic  and bedside swallow evaluation completed with results and recommendations reviewed with patient and family.     SLP Assessment  Patient will need skilled Speech Lanaguage Pathology Services during CIR admission    Recommendations  SLP Diet Recommendations: Dysphagia 1 (Puree);Nectar Liquid Administration via: Straw Medication Administration: Crushed with puree Supervision: Patient able to self feed;Full supervision/cueing for compensatory strategies Compensations: Slow rate;Small sips/bites;Minimize environmental distractions Postural Changes and/or Swallow Maneuvers: Seated upright 90 degrees;Upright 30-60 min after meal Oral Care Recommendations: Oral care BID Patient destination: Home Follow up Recommendations: Home Health SLP;24 hour supervision/assistance Equipment Recommended: To be determined    SLP Frequency 3 to 5 out of 7 days   SLP Duration  SLP Intensity  SLP Treatment/Interventions 21 days  Minumum of 1-2 x/day, 30 to 90 minutes  Cognitive remediation/compensation;Cueing hierarchy;Patient/family education;Environmental controls;Dysphagia/aspiration precaution training;Internal/external aids;Speech/Language facilitation    Pain Pain Assessment Pain Scale: Faces Pain Score: 0-No pain Faces Pain Scale: No hurt Multiple Pain Sites: No  Prior Functioning Cognitive/Linguistic Baseline: Within functional limits Type of Home: House  Lives With: Spouse Available Help at Discharge: Family;Available 24 hours/day Vocation: Full time employment  SLP Evaluation Cognition Overall Cognitive Status: Impaired/Different from baseline Arousal/Alertness: Lethargic Orientation Level: Oriented to person;Oriented to situation Attention: Focused Focused Attention: Impaired Sustained Attention: Impaired Sustained Attention Impairment: Verbal basic;Functional basic Memory: Impaired Memory Impairment: Decreased recall of new information Awareness: Impaired Awareness  Impairment: Intellectual impairment Problem Solving: Impaired Problem Solving Impairment: Functional basic Executive Function: Self Monitoring;Self Correcting Self Monitoring: Impaired Self Monitoring Impairment: Functional basic Self Correcting: Impaired Self Correcting Impairment: Functional basic Safety/Judgment: Impaired Comments: left inattention  Comprehension Auditory Comprehension Overall Auditory Comprehension: Appears within functional limits for tasks assessed Expression Expression Primary Mode of Expression: Verbal Verbal Expression Overall Verbal Expression: Appears within functional limits for tasks assessed Oral Motor Oral Motor/Sensory Function Overall Oral Motor/Sensory Function: Moderate impairment Facial ROM: Reduced left;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 Facial Symmetry: Abnormal symmetry left;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 Facial Strength: Reduced left;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 Lingual ROM: Reduced right;Reduced left Lingual Symmetry: Within Functional Limits Lingual Strength: Reduced Motor Speech Overall Motor Speech: Impaired Respiration: Within functional limits Phonation: Low vocal intensity Articulation: Impaired Level of Impairment: Phrase Intelligibility: Intelligibility reduced Phrase: 50-74% accurate Motor Planning: Witnin functional limits    Bedside Swallowing Assessment General Previous Swallow Assessment: MBS 11/14/19 Diet Prior to this Study: Dysphagia 1 (puree);Nectar-thick liquids Temperature Spikes Noted: No Respiratory Status: Room air History of Recent Intubation: Yes Length of Intubations (days): 7 days Date extubated: 11/09/19 Behavior/Cognition: Alert;Cooperative;Pleasant mood;Requires cueing Oral Cavity - Dentition: Adequate natural dentition Self-Feeding Abilities: Able to feed self;Needs assist;Needs set up Vision: Functional for self-feeding(impacted by left inattention) Patient Positioning:  Upright in bed Baseline Vocal Quality: Low vocal intensity  Oral Care Assessment   Ice Chips   Thin Liquid   Nectar Thick Nectar Thick Liquid: Within functional limits Honey Thick   Puree Puree: Impaired Oral Phase Impairments: Reduced labial seal Oral Phase Functional Implications: Left anterior spillage;Prolonged oral transit Pharyngeal Phase Impairments: Suspected delayed Swallow Solid   BSE Assessment Risk for Aspiration Impact on safety and function: Moderate aspiration risk Other Related Risk Factors: Prolonged intubation;Deconditioning;Cognitive impairment  Short  Term Goals: Week 1: SLP Short Term Goal 1 (Week 1): Pt will consume dys 1 textures and nectar thick liquids with min cues for use of swallowing precautions to clear residue from the oral cavity. SLP Short Term Goal 2 (Week 1): Pt will consume therapeutic trials of thin liquids with minimal overt s/s of aspiration and min cues for use of swallowing precautions over 3 consecutive sessions. SLP Short Term Goal 3 (Week 1): Pt will locate items needed for functional tasks at midline in >50% of opportunities with max assist multimodal cues. SLP Short Term Goal 4 (Week 1): Pt will sustain her attention to basic, familiar tasks for 3-5 minute intervals with mod cues needed for redirection. SLP Short Term Goal 5 (Week 1): Pt will complete basic, familiar tasks with mod assist multimodal cues for functional problem solving SLP Short Term Goal 6 (Week 1): Pt will utilize an increased vocal intensity and slow rate of speech to achieve intelligibility at the phrase/short sentence level with min assist verbal cues.  Refer to Care Plan for Long Term Goals  Recommendations for other services: None   Discharge Criteria: Patient will be discharged from SLP if patient refuses treatment 3 consecutive times without medical reason, if treatment goals not met, if there is a change in medical status, if patient makes no progress towards  goals or if patient is discharged from hospital.  The above assessment, treatment plan, treatment alternatives and goals were discussed and mutually agreed upon: by patient  Emilio Math 11/21/2019, 12:33 PM

## 2019-11-21 NOTE — Progress Notes (Signed)
Orthopedic Tech Progress Note Patient Details:  NICK ARMEL 10-24-1955 [id number] Called in order to [facility] for a [place] (WHO/COCK-UP and PRAFO) Patient ID: KEWANA SANON, female   DOB: 05-06-1956, 64 y.o.   MRN: [id number]   Janit Pagan 11/21/2019, 11:00 AM

## 2019-11-21 NOTE — Progress Notes (Signed)
[facility] PHYSICAL MEDICINE & REHABILITATION PROGRESS NOTE  Subjective/Complaints: Patient seen laying in bed this morning.  No reported issues overnight.  She states she slept well.  She states she is ready to begin therapies.  No reported issues/history overnight, cognitive deficits noted.  ROS: Denies CP, SOB, N/V/D  Objective: Vital Signs: Blood pressure (!) 149/65, pulse 63, temperature 98.5 F (36.9 C), resp. rate 18, height 5\' 4"  (1.626 m), weight 67.5 kg, SpO2 96 %. No results found. Recent Labs    11/20/19 1930 11/21/19 0658  WBC 7.4 8.2  HGB 10.4* 11.4*  HCT 32.9* 35.2*  PLT 364 346   Recent Labs    11/19/19 0500 11/19/19 0500 11/20/19 1930 11/21/19 0658  NA 136  --   --  138  K 4.0  --   --  4.0  CL 99  --   --  102  CO2 26  --   --  25  GLUCOSE 150*  --   --  76  BUN 9  --   --  8  CREATININE 0.65   < > 0.65 0.53  CALCIUM 8.5*  --   --  8.7*   < > = values in this interval not displayed.    Physical Exam: BP (!) 149/65   Pulse 63   Temp 98.5 F (36.9 C)   Resp 18   Ht 5\' 4"  (1.626 m)   Wt 67.5 kg   SpO2 96%   BMI 25.54 kg/m  Constitutional: No distress . Vital signs reviewed. HENT: Craniectomy on the right with staples C/D/I + NG Eyes: EOMI. No discharge. Cardiovascular: No JVD. Respiratory: Normal effort.  No stridor. GI: Non-distended.  Bone flap in right lower quadrant Skin: See above Right lower quadrant C/C/I Psych: Flat. Slowed. Musc: No edema in extremities.  No tenderness in extremities. Neurological:  Alert and oriented x4 Motor: RUE: 4/5 proximal distal RLE: 4/5 proximal distal LUE: 0/5 proximal distal LLE: Hip flexion, knee extension 2/5, wiggles toes with apraxia Facial weakness   Assessment/Plan: 1. Functional deficits secondary to large right MCA infarct due to right ICA occlusion status post revascularization/endovascular thrombectomy status post decompressive right hemicraniectomy which require 3+ hours per day of  interdisciplinary therapy in a comprehensive inpatient rehab setting.  Physiatrist is providing close team supervision and 24 hour management of active medical problems listed below.  Physiatrist and rehab team continue to assess barriers to discharge/monitor patient progress toward functional and medical goals  Care Tool:  Bathing              Bathing assist       Upper Body Dressing/Undressing Upper body dressing   What is the patient wearing?: Hospital gown only    Upper body assist Assist Level: Moderate Assistance - Patient 50 - 74%    Lower Body Dressing/Undressing Lower body dressing      What is the patient wearing?: Incontinence brief     Lower body assist Assist for lower body dressing: Total Assistance - Patient < 25%     Toileting Toileting    Toileting assist Assist for toileting: Total Assistance - Patient < 25%     Transfers Chair/bed transfer  Transfers assist           Locomotion Ambulation   Ambulation assist              Walk 10 feet activity   Assist           Walk 50 feet  activity   Assist           Walk 150 feet activity   Assist           Walk 10 feet on uneven surface  activity   Assist           Wheelchair     Assist               Wheelchair 50 feet with 2 turns activity    Assist            Wheelchair 150 feet activity     Assist          Medical Problem List and Plan: 1. 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    Helmet for safety when OOB.  Begin CIR evaluations  Discussed with neurology, repeat head CT ordered with plans for anticoagulation if improvement.  Acute  therapy notes reviewed, requiring mod to max assist with therapies for transfers and bed mobility.  WHO/PRAFO ordered 2.  Antithrombotics: -DVT/anticoagulation: Lovenox initiated 11/09/2019. Will start Eliquis if head CT improved.             -antiplatelet therapy: Aspirin 325 mg daily initiated 11/17/2019 3. Pain Management: Tylenol as needed 4. Mood: Amantadine 100 mg twice daily             -antipsychotic agents: N/A 5. Neuropsych: This patient is not fully capable of making decisions on her own behalf. 6. Skin/Wound Care: Routine skin checks 7. Fluids/Electrolytes/Nutrition: Routine in and outs.    BMP within acceptable range on 5/7 8.  Atrial fibrillation/pacemaker.    Failed Coumadin, will start Eliquis if CT shows improvement  Cardiac rate controlled.    Continue Cardizem 60 mg every 6 hours as well as Lopressor 25 mg twice daily.    Follow-up cardiology services as needed 9.  Acute blood loss anemia.    Hemoglobin 11.4 on 5/7  Continue to monitor 10. Uncontrolled diabetes mellitus type 2 with hyperglycemia.  Hemoglobin A1c 9.7.    NovoLog 8 units twice daily, Lantus insulin 20 units twice daily.    Check blood sugars before meals and at bedtime.  Lantus held on 5/7 due to low a.m. CBG, will monitor with increased mobility and consider further dose adjustments to avoid hypoglycemia 11.  Post stroke dysphagia.  Dysphagia #1 nectar liquids.  Follow-up speech therapy  Continue nocturnal tube feeds if needed   Will monitor oral intake 12.  Hyperlipidemia.  Lipitor  LOS: 1 days A FACE TO FACE EVALUATION WAS PERFORMED  Krystal Little Krystal Little 11/21/2019, 10:08 AM

## 2019-11-21 NOTE — Plan of Care (Signed)
  Problem: Consults Goal: RH STROKE PATIENT EDUCATION Description: See Patient Education module for education specifics  Outcome: Progressing Goal: Nutrition Consult-if indicated Outcome: Progressing   Problem: RH BLADDER ELIMINATION Goal: RH STG MANAGE BLADDER WITH ASSISTANCE Description: STG Manage Bladder With min Assistance Outcome: Progressing   Problem: RH SKIN INTEGRITY Goal: RH STG SKIN FREE OF INFECTION/BREAKDOWN Description: Pt will be free of skin breakdown/infection with min assist while in CIR Outcome: Progressing   Problem: RH SAFETY Goal: RH STG ADHERE TO SAFETY PRECAUTIONS W/ASSISTANCE/DEVICE Description: STG Adhere to Safety Precautions With supervision Assistance/Device. Outcome: Progressing   Problem: RH KNOWLEDGE DEFICIT Goal: RH STG INCREASE KNOWLEDGE OF DIABETES Description: Pt/family will be able to demonstrate understanding of DM management with mod I assist using handouts/booklets prior to DC Outcome: Progressing Goal: RH STG INCREASE KNOWLEDGE OF HYPERTENSION Description: Pt/family will be able to demonstrate understanding of HTN management with mod I assist using handouts/booklets prior to DC Outcome: Progressing Goal: RH STG INCREASE KNOWLEDGE OF DYSPHAGIA/FLUID INTAKE Description: Pt/family will be able to demonstrate understanding of dysphagia management and aspiration risk with mod I assist using handouts/booklets prior to DC Outcome: Progressing Goal: RH STG INCREASE KNOWLEGDE OF HYPERLIPIDEMIA Description: Pt/family will be able to demonstrate understanding of HLD management with mod I assist using handouts/booklets prior to DC Outcome: Progressing Goal: RH STG INCREASE KNOWLEDGE OF STROKE PROPHYLAXIS Description: Pt/family will be able to demonstrate understanding of stroke prevention with mod I assist using handouts/booklets prior to DC Outcome: Progressing   Problem: RH Vision Goal: RH LTG Vision (Specify) Outcome: Progressing

## 2019-11-22 ENCOUNTER — Inpatient Hospital Stay (HOSPITAL_COMMUNITY): Payer: Medicare Other | Admitting: Speech Pathology

## 2019-11-22 ENCOUNTER — Inpatient Hospital Stay (HOSPITAL_COMMUNITY): Payer: Medicare Other

## 2019-11-22 ENCOUNTER — Inpatient Hospital Stay (HOSPITAL_COMMUNITY): Payer: Medicare Other | Admitting: Physical Therapy

## 2019-11-22 DIAGNOSIS — I63511 Cerebral infarction due to unspecified occlusion or stenosis of right middle cerebral artery: Secondary | ICD-10-CM | POA: Diagnosis not present

## 2019-11-22 LAB — GLUCOSE, CAPILLARY
Glucose-Capillary: 100 mg/dL — ABNORMAL HIGH (ref 70–99)
Glucose-Capillary: 174 mg/dL — ABNORMAL HIGH (ref 70–99)
Glucose-Capillary: 190 mg/dL — ABNORMAL HIGH (ref 70–99)
Glucose-Capillary: 251 mg/dL — ABNORMAL HIGH (ref 70–99)
Glucose-Capillary: 272 mg/dL — ABNORMAL HIGH (ref 70–99)

## 2019-11-22 MED ORDER — INSULIN GLARGINE 100 UNIT/ML ~~LOC~~ SOLN
23.0000 [IU] | Freq: Two times a day (BID) | SUBCUTANEOUS | Status: DC
  Administered 2019-11-22 – 2019-11-23 (×2): 23 [IU] via SUBCUTANEOUS
  Filled 2019-11-22 (×3): qty 0.23

## 2019-11-22 NOTE — Progress Notes (Signed)
Speech Language Pathology Daily Session Note  Patient Details  Name: Krystal Little MRN: [id number] Date of Birth: 07-28-1955  Today's Date: 11/22/2019 SLP Individual Time: 1000-1044 SLP Individual Time Calculation (min): 44 min  Short Term Goals: Week 1: SLP Short Term Goal 1 (Week 1): Pt will consume dys 1 textures and nectar thick liquids with min cues for use of swallowing precautions to clear residue from the oral cavity. SLP Short Term Goal 2 (Week 1): Pt will consume therapeutic trials of thin liquids with minimal overt s/s of aspiration and min cues for use of swallowing precautions over 3 consecutive sessions. SLP Short Term Goal 3 (Week 1): Pt will locate items needed for functional tasks at midline in >50% of opportunities with max assist multimodal cues. SLP Short Term Goal 4 (Week 1): Pt will sustain her attention to basic, familiar tasks for 3-5 minute intervals with mod cues needed for redirection. SLP Short Term Goal 5 (Week 1): Pt will complete basic, familiar tasks with mod assist multimodal cues for functional problem solving SLP Short Term Goal 6 (Week 1): Pt will utilize an increased vocal intensity and slow rate of speech to achieve intelligibility at the phrase/short sentence level with min assist verbal cues.  Skilled Therapeutic Interventions: Pt was seen for skilled ST targeting dysphagia and cognitive goals. Pt's husband present throughout ~2/3 of session. SLP set up suction for pt's use, given decreased management of secretions noted (mild pooling of saliva in oral cavity) and preference for her to complete oral care via suction. Pt required overall Moderate verbal and visual cues for basic problem solving during self care tasks and use of suction toothbrush for oral care. She also required Max A multimodal cues to locate items at midline in functional situations and scan left to right to interpret a basic weekly weather forecast. Pt accepted trials of upgraded thin  liquids via tsp with 1 episode of prolonged coughing following 3rd presentation. Pt did orally expectorate some thin H2O during coughing episode. Recommend continue current diet. Pt left sitting in tilt in space chair with alarm set and needs within reach, husband still present.      Pain Pain Assessment Pain Scale: Faces Faces Pain Scale: No hurt  Therapy/Group: Individual Therapy  [name] 11/22/2019, 7:06 AM

## 2019-11-22 NOTE — Plan of Care (Signed)
  Problem: RH SAFETY Goal: RH STG ADHERE TO SAFETY PRECAUTIONS W/ASSISTANCE/DEVICE Description: STG Adhere to Safety Precautions With supervision Assistance/Device. Outcome: Progressing   Problem: RH BLADDER ELIMINATION Goal: RH STG MANAGE BLADDER WITH ASSISTANCE Description: STG Manage Bladder With min Assistance Outcome: Progressing

## 2019-11-22 NOTE — Progress Notes (Signed)
Occupational Therapy Session Note  Patient Details  Name: Krystal Little MRN: [id number] Date of Birth: 02-13-1956  Today's Date: 11/22/2019 OT Individual Time: 0845-1000 OT Individual Time Calculation (min): 75 min    Short Term Goals: Week 1:  OT Short Term Goal 1 (Week 1): Pt will be able to actively turn her head to the left with min Cues. OT Short Term Goal 2 (Week 1): Pt will be able to wash her L arm with mod A. OT Short Term Goal 3 (Week 1): Pt will be able to sit to EOB with mod A of 1. OT Short Term Goal 4 (Week 1): Pt will be able to rise to stand in stedy lift with max A of 1. OT Short Term Goal 5 (Week 1): Pt will be able to self feed with min A.  Skilled Therapeutic Interventions/Progress Updates:    OT session focused on self-feeding skills, transfers, body awareness, and visual skills. Pt received supine in bed agreeable to transfer to w/c for breakfast. Completed supine>sit EOB with total A then transferred to tilt in space w/c with total A +2 using Stedy. OT provided support to LUE throughout transfer. During meal, pt required total assist to locate items at midline and to the left of midline. Pt utilized spook and fork to self-feed with ~65% accuracy. Pt retrieved drinks positioned on R side and brought to mouth 10+ times with supervision. Following meal, pt had incontinent episode, therefore completed hygiene and donning of new brief with total A +2 using the stedy. At end of session, pt left reclined in w/c and all needs in reach.   Therapy Documentation Precautions:  Precautions Precautions: Fall Precaution Comments: R hemicraniectomy- skull in R abdomen, L shoulder subluxation, L lateral lean and inattention, needs helmet for OOB, L weakness Restrictions Weight Bearing Restrictions: No General:   Vital Signs:  Pain:   ADL:   Vision   Perception    Praxis   Exercises:   Other Treatments:     Therapy/Group: Individual Therapy  Duayne Cal 11/22/2019, 12:15 PM

## 2019-11-22 NOTE — Progress Notes (Signed)
[facility] PHYSICAL MEDICINE & REHABILITATION PROGRESS NOTE  Subjective/Complaints: Lying in bed. No obvious compalint. Nurses note no new problems  ROS: limited d/t cognitive.    Objective: Vital Signs: Blood pressure (!) 152/61, pulse 65, temperature 98.1 F (36.7 C), temperature source Oral, resp. rate 20, height 5\' 4"  (1.626 m), weight 67.5 kg, SpO2 98 %. CT HEAD WO CONTRAST  Result Date: 11/21/2019 CLINICAL DATA:  Recent CVA with edema EXAM: CT HEAD WITHOUT CONTRAST TECHNIQUE: Contiguous axial images were obtained from the base of the skull through the vertex without intravenous contrast. COMPARISON:  Nov 16, 2019 FINDINGS: Brain: In comparison with the most recent study, there is less mass effect on the right lateral ventricle. There is no longer a face mint of the third ventricle. There is 3 mm of midline shift toward the left, slightly less than on most recent study. Decreased attenuation is seen throughout the right mid and posterior frontal lobe regions as well as much of the right temporal lobe, stable, as well as cytotoxic edema throughout essentially the entire right basal ganglia, stable. Scattered areas of hemorrhage within this infarct remain. A more discrete area of hemorrhage seen previously in the periphery of the anterior right temporal lobe has essentially resolved. No new hemorrhage evident. No new infarct is evident. No subdural or epidural fluid collections are evident. Vascular: No appreciable hyperdense vessel. There is calcification in each carotid siphon region. Skull: Right frontal and cranial bony defects are again noted without change. No new bony defects. Sinuses/Orbits: There is mucosal thickening in several ethmoid air cells. Other visualized paranasal sinuses are clear. Orbits appear symmetric bilaterally. Other: Mastoid air cells are clear. IMPRESSION: 1. Less mass effect on the right lateral and third ventricles. 3 mm of midline shift to the left, less than on most  recent study. 2. Further decrease in hemorrhage in the right temporal lobe compared to previous study. Scattered areas of hemorrhage noted, primarily in the periphery of the large right hemispheric infarct. 3. Distribution of infarct is essentially stable involving much of the mid and posterior right frontal lobe as well as much of the right temporal lobe and essentially the entire right basal ganglia region involve. No new infarct evident. 4.  Sizable postoperative bony defect on the right, stable. 5.  Foci of arterial vascular calcification noted. 6.  Mucosal thickening in several ethmoid air cells. Electronically Signed   By: Lowella Grip III M.D.   On: 11/21/2019 13:56   Recent Labs    11/20/19 1930 11/21/19 0658  WBC 7.4 8.2  HGB 10.4* 11.4*  HCT 32.9* 35.2*  PLT 364 346   Recent Labs    11/20/19 1930 11/21/19 0658  NA  --  138  K  --  4.0  CL  --  102  CO2  --  25  GLUCOSE  --  76  BUN  --  8  CREATININE 0.65 0.53  CALCIUM  --  8.7*    Physical Exam: BP (!) 152/61 (BP Location: Right Arm)   Pulse 65   Temp 98.1 F (36.7 C) (Oral)   Resp 20   Ht 5\' 4"  (1.626 m)   Wt 67.5 kg   SpO2 98%   BMI 25.54 kg/m  Constitutional: No distress . Vital signs reviewed. HEENT: EOMI, oral membranes moist, crani staples/incisions intact Neck: supple Cardiovascular: RRR without murmur. No JVD    Respiratory/Chest: CTA Bilaterally without wheezes or rales. Normal effort    GI/Abdomen: BS +, non-tender, non-distended,  bone flap Ext: no clubbing, cyanosis, or edema Psych: pleasant and cooperative Skin: See above Right lower quadrant C/C/I Psych: Flat. Slowed. Musc: No edema in extremities.  No tenderness in extremities. Neurological:  Alert and oriented x4, very slow to process Motor: RUE: 4/5 proximal distal RLE: 4/5 proximal distal LUE: 0/5 proximal distal--no real change LLE: Hip flexion, knee extension 2/5, wiggles toes with apraxia Facial  weakness   Assessment/Plan: 1. 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  Physiatrist is providing close team supervision and 24 hour management of active medical problems listed below.  Physiatrist and rehab team continue to assess barriers to discharge/monitor patient progress toward functional and medical goals  Care Tool:  Bathing              Bathing assist Assist Level: 2 Helpers     Upper Body Dressing/Undressing Upper body dressing   What is the patient wearing?: Hospital gown only    Upper body assist Assist Level: Dependent - Patient 0%    Lower Body Dressing/Undressing Lower body dressing      What is the patient wearing?: Incontinence brief     Lower body assist Assist for lower body dressing: 2 Helpers     Toileting Toileting    Toileting assist Assist for toileting: Total Assistance - Patient < 25%     Transfers Chair/bed transfer  Transfers assist     Chair/bed transfer assist level: Dependent - mechanical lift     Locomotion Ambulation   Ambulation assist   Ambulation activity did not occur: Safety/medical concerns(lethargy)          Walk 10 feet activity   Assist  Walk 10 feet activity did not occur: Safety/medical concerns(lethargy)        Walk 50 feet activity   Assist Walk 50 feet with 2 turns activity did not occur: Safety/medical concerns(lethargy)         Walk 150 feet activity   Assist Walk 150 feet activity did not occur: Safety/medical concerns(lethargy)         Walk 10 feet on uneven surface  activity   Assist Walk 10 feet on uneven surfaces activity did not occur: Safety/medical concerns(lethargy)         Wheelchair     Assist Will  patient use wheelchair at discharge?: Yes Type of Wheelchair: (TBD)    Wheelchair assist level: Dependent - Patient 0%      Wheelchair 50 feet with 2 turns activity    Assist        Assist Level: Dependent - Patient 0%   Wheelchair 150 feet activity     Assist     Assist Level: Dependent - Patient 0%    Medical Problem List and Plan: 1. 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    Helmet for safety when OOB.  Begin CIR evaluations  Discussed with neurology, repeat head CT ordered with plans for anticoagulation if improvement.    -5/8 CT does show some improvement compared to 5/2 studay  Acute therapy notes reviewed, requiring mod to max assist with therapies for transfers and bed mobility.  WHO/PRAFO ordered  2.  Antithrombotics: -DVT/anticoagulation: Lovenox initiated 11/09/2019. Await word from neurology re: re starting eliquis. Will not resume yet although CT does demonstrate interval improvement             -antiplatelet therapy: Aspirin 325 mg daily initiated 11/17/2019 3. Pain Management: Tylenol as needed 4. Mood: Amantadine 100 mg twice daily             -antipsychotic agents: N/A 5. Neuropsych: This patient is not fully capable of making decisions on her own behalf. 6. Skin/Wound Care: Routine skin checks 7. Fluids/Electrolytes/Nutrition: Routine in and outs.    BMP within acceptable range on 5/7 8.  Atrial fibrillation/pacemaker.    Failed Coumadin, will start Eliquis if CT shows improvement  Cardiac rate controlled.    Continue Cardizem 60 mg every 6 hours as well as Lopressor 25 mg twice daily.    Follow-up cardiology services as needed 9.  Acute blood loss anemia.     Hemoglobin 11.4 on 5/7  Continue to monitor 10. Uncontrolled diabetes mellitus type 2 with hyperglycemia.  Hemoglobin A1c 9.7.    NovoLog 8 units twice daily, Lantus insulin 20 units twice daily.    Check blood sugars before meals and at bedtime.  cbg's (except for 1) consistently elevated. Increase lantus to 23 u bid 11.  Post stroke dysphagia.  Dysphagia #1 nectar liquids.  Follow-up speech therapy  Continue nocturnal tube feeds if needed   Will monitor oral intake 12.  Hyperlipidemia.  Lipitor  LOS: 2 days A FACE TO FACE EVALUATION WAS PERFORMED  Meredith Staggers 11/22/2019, 10:39 AM

## 2019-11-22 NOTE — Progress Notes (Signed)
Physical Therapy Session Note  Patient Details  Name: Krystal Little MRN: [id number] Date of Birth: 1955-11-06  Today's Date: 11/22/2019 PT Individual Time: 1700-1749 PT Individual Time Calculation (min): 63 min   and  Today's Date: 11/22/2019 PT Missed Time: 12 Minutes Missed Time Reason: Patient fatigue  Short Term Goals: Week 1:  PT Short Term Goal 1 (Week 1): Patient to be able to maintain midline sitting with no more than MinA and Min cues PT Short Term Goal 2 (Week 1): Patient to be able to tolerate standing in standing frame or stedy for at least 5 minutes PT Short Term Goal 3 (Week 1): Patient to require only Min cues to correct L lateral lean in sitting or standing PT Short Term Goal 4 (Week 1): Patient to be able to transfer with MinAx2 in stedy  Skilled Therapeutic Interventions/Progress Updates:    Pt received supine in bed reporting she needs to use bathroom and agreeable to therapy session. Therapist donned helmet for OOB mobility. Supine>sitting R EOB with max assist for rolling into R sidelying and total assist for trunk upright. Pt demonstrates strong L lateral trunk lean with pushing. Sit<>stand using stedy with +2 max assist for lifting into standing and trunk control due to heavy L lateral trunk flexed posture in stance with inability to correct despite manual facilitation. Stedy transfer to [facility]. Standing with max assist of 1 for trunk control/mildline orientation due to strong L lateral trunk lean due to pushing while +2 assist performed total assist LB clothing management and peri-care for cleanliness - pt unable to void despite increased time on BSC. Pt requires max assist to maintain upright while sitting on BSC due to L lateral trunk lean. Stedy transfer back to EOB and no +2 available for remainder of session therefore focused on sitting head and trunk control, midline orientation, decreased pushing, and bed mobility. Sit>supine with max assist for B LE management into the  bed and cuing for R UE to assist with trunk control. In supine performed bridging with manual facilitation for L LE positioning and to prevent hip adduction x10reps with pt unable to clear hips and becoming lethargic while in supine due to fatigue from transfer to [facility] requiring increased cuing to maintain alertness to participate attempted supine LLE heel slides with pt able to perform ~10% of the movement requiring active assist to complete. Returned to R EOB as described above. Performed trunk control/sitting balance and midline orientation tasks sitting EOB with visual feedback for midline while transitioning from R lateral trunk lean on forearm support to upright in midline to sustaining midline while holding therapist with R hand to stop the pushing. Progressed to maintaining midline in static sitting for ~15seconds with CGA prior to L lateral LOB requiring total assist to maintain upright. Pt continues to demonstrate poor head control throughout with cervical flexion and downward gaze. Returned to supine as described above and then therapeutically positioning in L sidelying for pressure relief and increased pt comfort with pt falling asleep due to fatigue. Helmet doffed at end of session. Left with needs in reach and bed alarm on. Missed 12 minutes of skilled physical therapy.  Therapy Documentation Precautions:  Precautions Precautions: Fall Precaution Comments: R hemicraniectomy- skull in R abdomen, L shoulder subluxation, L lateral lean and inattention, needs helmet for OOB, L weakness Restrictions Weight Bearing Restrictions: No  Pain: Reports headache pain is worse when initially returning to supine - provided time for pain to lessen before performing additional mobility tasks.  Therapy/Group: Individual Therapy  Tawana Scale, PT, DPT 11/22/2019, 2:52 PM

## 2019-11-23 DIAGNOSIS — I63511 Cerebral infarction due to unspecified occlusion or stenosis of right middle cerebral artery: Secondary | ICD-10-CM | POA: Diagnosis not present

## 2019-11-23 LAB — GLUCOSE, CAPILLARY
Glucose-Capillary: 117 mg/dL — ABNORMAL HIGH (ref 70–99)
Glucose-Capillary: 134 mg/dL — ABNORMAL HIGH (ref 70–99)
Glucose-Capillary: 193 mg/dL — ABNORMAL HIGH (ref 70–99)
Glucose-Capillary: 250 mg/dL — ABNORMAL HIGH (ref 70–99)
Glucose-Capillary: 306 mg/dL — ABNORMAL HIGH (ref 70–99)

## 2019-11-23 MED ORDER — GLUCERNA 1.5 CAL PO LIQD
250.0000 mL | ORAL | Status: DC
  Administered 2019-11-23 – 2019-11-25 (×3): 250 mL
  Filled 2019-11-23: qty 1000
  Filled 2019-11-23 (×4): qty 474

## 2019-11-23 MED ORDER — INSULIN ASPART 100 UNIT/ML ~~LOC~~ SOLN
8.0000 [IU] | Freq: Two times a day (BID) | SUBCUTANEOUS | Status: DC
  Administered 2019-11-23 – 2019-12-10 (×29): 8 [IU] via SUBCUTANEOUS

## 2019-11-23 MED ORDER — INSULIN GLARGINE 100 UNIT/ML ~~LOC~~ SOLN
21.0000 [IU] | Freq: Two times a day (BID) | SUBCUTANEOUS | Status: DC
  Administered 2019-11-23 – 2019-11-28 (×10): 21 [IU] via SUBCUTANEOUS
  Filled 2019-11-23 (×11): qty 0.21

## 2019-11-23 NOTE — Progress Notes (Addendum)
[place] PHYSICAL MEDICINE & REHABILITATION PROGRESS NOTE  Subjective/Complaints: No new issues this morning. Appears comfortable  ROS: Limited due to cognitive/behavioral    Objective: Vital Signs: Blood pressure 120/62, pulse (!) 59, temperature 99.1 F (37.3 C), temperature source Oral, resp. rate 17, height 5\' 4"  (1.626 m), weight 62.2 kg, SpO2 98 %. CT HEAD WO CONTRAST  Result Date: 11/21/2019 CLINICAL DATA:  Recent CVA with edema EXAM: CT HEAD WITHOUT CONTRAST TECHNIQUE: Contiguous axial images were obtained from the base of the skull through the vertex without intravenous contrast. COMPARISON:  Nov 16, 2019 FINDINGS: Brain: In comparison with the most recent study, there is less mass effect on the right lateral ventricle. There is no longer a face mint of the third ventricle. There is 3 mm of midline shift toward the left, slightly less than on most recent study. Decreased attenuation is seen throughout the right mid and posterior frontal lobe regions as well as much of the right temporal lobe, stable, as well as cytotoxic edema throughout essentially the entire right basal ganglia, stable. Scattered areas of hemorrhage within this infarct remain. A more discrete area of hemorrhage seen previously in the periphery of the anterior right temporal lobe has essentially resolved. No new hemorrhage evident. No new infarct is evident. No subdural or epidural fluid collections are evident. Vascular: No appreciable hyperdense vessel. There is calcification in each carotid siphon region. Skull: Right frontal and cranial bony defects are again noted without change. No new bony defects. Sinuses/Orbits: There is mucosal thickening in several ethmoid air cells. Other visualized paranasal sinuses are clear. Orbits appear symmetric bilaterally. Other: Mastoid air cells are clear. IMPRESSION: 1. Less mass effect on the right lateral and third ventricles. 3 mm of midline shift to the left, less than on most  recent study. 2. Further decrease in hemorrhage in the right temporal lobe compared to previous study. Scattered areas of hemorrhage noted, primarily in the periphery of the large right hemispheric infarct. 3. Distribution of infarct is essentially stable involving much of the mid and posterior right frontal lobe as well as much of the right temporal lobe and essentially the entire right basal ganglia region involve. No new infarct evident. 4.  Sizable postoperative bony defect on the right, stable. 5.  Foci of arterial vascular calcification noted. 6.  Mucosal thickening in several ethmoid air cells. Electronically Signed   By: Lowella Grip III M.D.   On: 11/21/2019 13:56   Recent Labs    11/20/19 1930 11/21/19 0658  WBC 7.4 8.2  HGB 10.4* 11.4*  HCT 32.9* 35.2*  PLT 364 346   Recent Labs    11/20/19 1930 11/21/19 0658  NA  --  138  K  --  4.0  CL  --  102  CO2  --  25  GLUCOSE  --  76  BUN  --  8  CREATININE 0.65 0.53  CALCIUM  --  8.7*    Physical Exam: BP 120/62 (BP Location: Right Arm)   Pulse (!) 59   Temp 99.1 F (37.3 C) (Oral)   Resp 17   Ht 5\' 4"  (1.626 m)   Wt 62.2 kg   SpO2 98%   BMI 23.54 kg/m  Constitutional: No distress . Vital signs reviewed. HEENT: EOMI, oral membranes moist, scalp incisions cdi with staples Neck: supple Cardiovascular: RRR without murmur. No JVD    Respiratory/Chest: CTA Bilaterally without wheezes or rales. Normal effort    GI/Abdomen: BS +, non-tender, non-distended Ext:  no clubbing, cyanosis, or edema Psych: pleasant and cooperative Skin: See above Right lower quadrant C/D/I Psych: Flat. Slowed. Musc: No edema in extremities.  No tenderness in extremities. Neurological:  Alert and oriented x4, very slow to process Motor: RUE: 4/5 proximal distal RLE: 4/5 proximal distal LUE: 0/5 proximal distal--no real change LLE: Hip flexion, knee extension 2/5, wiggles toes with apraxia---no change Facial  weakness   Assessment/Plan: 1. 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  Physiatrist is providing close team supervision and 24 hour management of active medical problems listed below.  Physiatrist and rehab team continue to assess barriers to discharge/monitor patient progress toward functional and medical goals  Care Tool:  Bathing              Bathing assist Assist Level: 2 Helpers     Upper Body Dressing/Undressing Upper body dressing   What is the patient wearing?: Hospital gown only    Upper body assist Assist Level: Total Assistance - Patient < 25%    Lower Body Dressing/Undressing Lower body dressing      What is the patient wearing?: Incontinence brief     Lower body assist Assist for lower body dressing: 2 Helpers     Toileting Toileting    Toileting assist Assist for toileting: Total Assistance - Patient < 25%     Transfers Chair/bed transfer  Transfers assist     Chair/bed transfer assist level: 2 Helpers(stedy)     Locomotion Ambulation   Ambulation assist   Ambulation activity did not occur: Safety/medical concerns(lethargy)          Walk 10 feet activity   Assist  Walk 10 feet activity did not occur: Safety/medical concerns(lethargy)        Walk 50 feet activity   Assist Walk 50 feet with 2 turns activity did not occur: Safety/medical concerns(lethargy)         Walk 150 feet activity   Assist Walk 150 feet activity did not occur: Safety/medical concerns(lethargy)         Walk 10 feet on uneven surface  activity   Assist Walk 10 feet on uneven surfaces activity did not occur: Safety/medical concerns(lethargy)         Wheelchair     Assist Will patient  use wheelchair at discharge?: Yes Type of Wheelchair: (TBD)    Wheelchair assist level: Dependent - Patient 0%      Wheelchair 50 feet with 2 turns activity    Assist        Assist Level: Dependent - Patient 0%   Wheelchair 150 feet activity     Assist     Assist Level: Dependent - Patient 0%    Medical Problem List and Plan: 1. 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    Helmet for safety when OOB.  -Continue CIR therapies including PT, OT, and SLP   [facility]  Discussed with neurology, repeat head CT ordered with plans for anticoagulation if improvement.    -5/9 HCT demonstrates interval improvement. Discuss with neurologists familiar with this case tomorrow re: starting eliquis       2.  Antithrombotics: -DVT/anticoagulation: Lovenox initiated 11/09/2019. Consider starting eliquis after discussing with neuro tomorrow             -antiplatelet therapy: Aspirin 325 mg daily initiated 11/17/2019 3. Pain Management: Tylenol as needed 4. Mood: Amantadine 100 mg twice daily             -antipsychotic agents: N/A 5. Neuropsych: This patient is not fully capable of making decisions on her own behalf. 6. Skin/Wound Care: Routine skin checks 7. Fluids/Electrolytes/Nutrition: Routine in and outs.    BMP within acceptable range on 5/7---recheck tomorrow 5/9 8.  Atrial fibrillation/pacemaker.    Failed Coumadin, will start Eliquis if CT shows improvement  Cardiac rate controlled.    Continue Cardizem 60 mg every 6 hours as well as Lopressor 25 mg twice daily.    Follow-up cardiology services as needed 9.  Acute blood loss anemia.    Hemoglobin 11.4 on 5/7  Continue to monitor 10.  Uncontrolled diabetes mellitus type 2 with hyperglycemia.  Hemoglobin A1c 9.7.    NovoLog 8 units twice daily, Lantus insulin 20 units twice daily.    Check blood sugars before meals and at bedtime.  5/9 cbg's controlled. Will reduce lantus to 21 u bid given reduction of TF   -continue novolog 8u, change to meal time admin 11.  Post stroke dysphagia.  Dysphagia #1 nectar liquids.  Follow-up speech therapy  Continue nocturnal tube feeds if needed   Intake borderline, picked up a little on 5/8  5/9 will reduce glucerna TF to 25cc/hr to stimulate appetite 12.  Hyperlipidemia.  Lipitor  LOS: 3 days A FACE TO [place] 11/23/2019, 9:38 AM

## 2019-11-23 NOTE — Plan of Care (Signed)
  Problem: RH SAFETY Goal: RH STG ADHERE TO SAFETY PRECAUTIONS W/ASSISTANCE/DEVICE Description: STG Adhere to Safety Precautions With supervision Assistance/Device. Outcome: Progressing   Problem: RH BLADDER ELIMINATION Goal: RH STG MANAGE BLADDER WITH ASSISTANCE Description: STG Manage Bladder With min Assistance Outcome: Progressing

## 2019-11-24 ENCOUNTER — Inpatient Hospital Stay (HOSPITAL_COMMUNITY): Payer: Medicare Other | Admitting: Occupational Therapy

## 2019-11-24 ENCOUNTER — Inpatient Hospital Stay (HOSPITAL_COMMUNITY): Payer: Medicare Other | Admitting: Physical Therapy

## 2019-11-24 ENCOUNTER — Inpatient Hospital Stay (HOSPITAL_COMMUNITY): Payer: Medicare Other | Admitting: Speech Pathology

## 2019-11-24 DIAGNOSIS — I4891 Unspecified atrial fibrillation: Secondary | ICD-10-CM | POA: Diagnosis not present

## 2019-11-24 DIAGNOSIS — I63511 Cerebral infarction due to unspecified occlusion or stenosis of right middle cerebral artery: Secondary | ICD-10-CM | POA: Diagnosis not present

## 2019-11-24 DIAGNOSIS — Z950 Presence of cardiac pacemaker: Secondary | ICD-10-CM

## 2019-11-24 DIAGNOSIS — E1165 Type 2 diabetes mellitus with hyperglycemia: Secondary | ICD-10-CM | POA: Diagnosis not present

## 2019-11-24 DIAGNOSIS — I69391 Dysphagia following cerebral infarction: Secondary | ICD-10-CM | POA: Diagnosis not present

## 2019-11-24 DIAGNOSIS — R7309 Other abnormal glucose: Secondary | ICD-10-CM

## 2019-11-24 LAB — GLUCOSE, CAPILLARY
Glucose-Capillary: 134 mg/dL — ABNORMAL HIGH (ref 70–99)
Glucose-Capillary: 139 mg/dL — ABNORMAL HIGH (ref 70–99)
Glucose-Capillary: 183 mg/dL — ABNORMAL HIGH (ref 70–99)
Glucose-Capillary: 224 mg/dL — ABNORMAL HIGH (ref 70–99)
Glucose-Capillary: 282 mg/dL — ABNORMAL HIGH (ref 70–99)

## 2019-11-24 LAB — BASIC METABOLIC PANEL
Anion gap: 9 (ref 5–15)
BUN: 12 mg/dL (ref 8–23)
CO2: 26 mmol/L (ref 22–32)
Calcium: 8.7 mg/dL — ABNORMAL LOW (ref 8.9–10.3)
Chloride: 105 mmol/L (ref 98–111)
Creatinine, Ser: 0.52 mg/dL (ref 0.44–1.00)
GFR calc Af Amer: >60 mL/min (ref >60)
GFR calc non Af Amer: >60 mL/min (ref >60)
Glucose, Bld: 179 mg/dL — ABNORMAL HIGH (ref 70–99)
Potassium: 3.9 mmol/L (ref 3.5–5.1)
Sodium: 140 mmol/L (ref 135–145)

## 2019-11-24 MED ORDER — APIXABAN 5 MG PO TABS
5.0000 mg | ORAL_TABLET | Freq: Two times a day (BID) | ORAL | Status: DC
  Administered 2019-11-24 – 2019-12-12 (×38): 5 mg via ORAL
  Filled 2019-11-24 (×38): qty 1

## 2019-11-24 NOTE — Progress Notes (Signed)
[place] PHYSICAL MEDICINE & REHABILITATION PROGRESS NOTE  Subjective/Complaints: Patient seen laying in bed this morning.  [name].  Patient states she slept well overnight.  She states had a good weekend.  She has questions regarding her CT from last week.  ROS: Denies CP, SOB, N/V/D  Objective: Vital Signs: Blood pressure (!) 141/68, pulse 60, temperature 98.4 F (36.9 C), temperature source Oral, resp. rate 16, height 5\' 4"  (1.626 m), weight 62.2 kg, SpO2 99 %. No results found. No results for input(s): WBC, HGB, HCT, PLT in the last 72 hours. Recent Labs    11/24/19 0555  NA 140  K 3.9  CL 105  CO2 26  GLUCOSE 179*  BUN 12  CREATININE 0.52  CALCIUM 8.7*    Physical Exam: BP (!) 141/68 (BP Location: Right Arm)   Pulse 60   Temp 98.4 F (36.9 C) (Oral)   Resp 16   Ht 5\' 4"  (1.626 m)   Wt 62.2 kg   SpO2 99%   BMI 23.54 kg/m  Constitutional: No distress . Vital signs reviewed. HENT: Craniectomy on right C/D/I.  + NG. Eyes: EOMI. No discharge. Cardiovascular: No JVD. Respiratory: Normal effort.  No stridor. GI: Non-distended. Bone flap right lower quadrant C/D/I Skin: Warm and dry.  Intact. Psych: Normal mood.  Normal behavior. Musc: No edema in extremities.  No tenderness in extremities. Skin: See above Psych: Flat.  Slowed. Musc: No edema in extremities.  No tenderness in extremities. Neurological: Alert and oriented, except for date of month Motor: RUE: 4/5 proximal distal RLE: 4/5 proximal distal LUE: 0/5 proximal distal LLE: Hip flexion, knee extension 2 +/5, wiggles toes with apraxia Left facial weakness Dysarthria  Assessment/Plan: 1. 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  Physiatrist  is providing close team supervision and 24 hour management of active medical problems listed below.  Physiatrist and rehab team continue to assess barriers to discharge/monitor patient progress toward functional and medical goals  Care Tool:  Bathing              Bathing assist Assist Level: 2 Helpers     Upper Body Dressing/Undressing Upper body dressing   What is the patient wearing?: Hospital gown only    Upper body assist Assist Level: Total Assistance - Patient < 25%    Lower Body Dressing/Undressing Lower body dressing      What is the patient wearing?: Incontinence brief     Lower body assist Assist for lower body dressing: 2 Helpers     Toileting Toileting    Toileting assist Assist for toileting: Total Assistance - Patient < 25%     Transfers Chair/bed transfer  Transfers assist     Chair/bed transfer assist level: 2 Helpers(stedy)     Locomotion Ambulation   Ambulation assist   Ambulation activity did not occur: Safety/medical concerns(lethargy)          Walk 10 feet activity   Assist  Walk 10 feet activity did not occur: Safety/medical concerns(lethargy)        Walk 50 feet activity   Assist Walk 50 feet with 2 turns activity did not occur: Safety/medical concerns(lethargy)         Walk 150 feet activity   Assist Walk 150 feet activity did not occur: Safety/medical concerns(lethargy)  Walk 10 feet on uneven surface  activity   Assist Walk 10 feet on uneven surfaces activity did not occur: Safety/medical concerns(lethargy)         Wheelchair     Assist Will patient use wheelchair at discharge?: Yes Type of Wheelchair: (TBD)    Wheelchair assist level: Dependent - Patient 0%      Wheelchair 50 feet with 2 turns activity    Assist        Assist Level: Dependent - Patient 0%   Wheelchair 150 feet activity     Assist     Assist Level: Dependent - Patient 0%    Medical  Problem List and Plan: 1. 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    Repeat head CT personally reviewed, and reviewed with patient, showing some improvement  Helmet for safety when OOB.  Continue CIR  [facility]  Communicated with therapies, shoulder sling ordered. 2.  Antithrombotics:  -DVT/anticoagulation:    Discussed with neurology, started Eliquis             -antiplatelet therapy: Aspirin 325 mg daily initiated 11/17/2019 3. Pain Management: Tylenol as needed 4. Mood: Amantadine 100 mg twice daily             -antipsychotic agents: N/A 5. Neuropsych: This patient is not fully capable of making decisions on her own behalf. 6. Skin/Wound Care: Routine skin checks 7. Fluids/Electrolytes/Nutrition: Routine in and outs.    BMP within acceptable range, except for glucose on 5/10 8.  Atrial fibrillation/pacemaker.    Failed Coumadin, started Eliquis  Cardiac rate controlled.    Continue Cardizem 60 mg every 6 hours as well as Lopressor 25 mg twice daily.    Follow-up cardiology services as needed 9.  Acute blood loss anemia.    Hemoglobin 11.4 on 5/7  Continue to monitor 10. Uncontrolled diabetes mellitus type 2 with hyperglycemia.  Hemoglobin A1c 9.7.    NovoLog 8 units with meals  Lantus insulin 21 units twice daily.    Check blood sugars before meals and at bedtime.  Labile on 5/10 11.  Post stroke dysphagia.  Dysphagia #1 nectar liquids.  Follow-up speech therapy  Continue nocturnal tube feeds    P.o. intake relatively poor  Reduced glucerna TF to 25cc/hr to stimulate appetite 12.  Hyperlipidemia.  Lipitor   LOS: 4 days A FACE TO FACE EVALUATION WAS PERFORMED  Caine Barfield Lorie Phenix 11/24/2019,  11:13 AM

## 2019-11-24 NOTE — Progress Notes (Signed)
Physical Therapy Session Note  Patient Details  Name: Krystal Little MRN: [id number] Date of Birth: 05-09-1956  Today's Date: 11/24/2019 PT Individual Time: 1116-1205 PT Individual Time Calculation (min): 49 min   Short Term Goals: Week 1:  PT Short Term Goal 1 (Week 1): Patient to be able to maintain midline sitting with no more than MinA and Min cues PT Short Term Goal 2 (Week 1): Patient to be able to tolerate standing in standing frame or stedy for at least 5 minutes PT Short Term Goal 3 (Week 1): Patient to require only Min cues to correct L lateral lean in sitting or standing PT Short Term Goal 4 (Week 1): Patient to be able to transfer with MinAx2 in stedy  Skilled Therapeutic Interventions/Progress Updates:    Patient received in bed, willing to participate in second session this morning. Requires totalA of one person for all functional bed mobility, and 2 person assist to adjust/reposition in bed. 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 TotalA for return to supine and 2 person assist to slide up in bed; during session, ortho tech arrived and donned sling as well. Otherwise returned to EOB with totalAx1, performed stedy transfer with MaxAx2 and heavy L lean and left up in [place] positioned to comfort. Lots of education provided to husband who became very aggressive in asking questions about her therapy time and challenging therapist about her scheduled times- stated "I want her getting out of the room, I don't want her to not get therapy because of time". Educated that she has scheduled therapy every day- while schedule may be different day to day she will always be getting her therapy wether it is in the room on gym and at the functional level she is at, we would be working on the exact same things that we just worked  on in the room. Husband insistent that PT ask patient if she wants to keep her glasses on or take them off at EOS- patient chose to leave them on. Left up in [facility] WC with all needs met and seat belt alarm active, husband present.   Therapy Documentation Precautions:  Precautions Precautions: Fall Precaution Comments: R hemicraniectomy- skull in R abdomen, L shoulder subluxation, L lateral lean and inattention, needs helmet for OOB, L weakness Restrictions Weight Bearing Restrictions: No   Pain: Pain Assessment Pain Scale: Faces Pain Score: 0-No pain Faces Pain Scale: No hurt Pain Type: Surgical pain(surgical site/staples) Pain Location: Head Pain Orientation: Right Pain Descriptors / Indicators: Aching Pain Intervention(s): Emotional support;Distraction(pt denied offer to ask RN for pain medication)    Therapy/Group: Individual Therapy   Windell Norfolk, DPT, PN1   Supplemental Physical Therapist [place]    Pager [phone] Acute Rehab Office [phone]    11/24/2019, 12:43 PM

## 2019-11-24 NOTE — Progress Notes (Signed)
Occupational Therapy Session Note  Patient Details  Name: Krystal Little MRN: [id number] Date of Birth: 08/24/1955  Today's Date: 11/24/2019 OT Individual Time: 1350-1450 OT Individual Time Calculation (min): 60 min    Short Term Goals: Week 1:  OT Short Term Goal 1 (Week 1): Pt will be able to actively turn her head to the left with min Cues. OT Short Term Goal 2 (Week 1): Pt will be able to wash her L arm with mod A. OT Short Term Goal 3 (Week 1): Pt will be able to sit to EOB with mod A of 1. OT Short Term Goal 4 (Week 1): Pt will be able to rise to stand in stedy lift with max A of 1. OT Short Term Goal 5 (Week 1): Pt will be able to self feed with min A.  Skilled Therapeutic Interventions/Progress Updates:    Pt received in tilt and space w/c and agreeable to working on self care tasks.  positioned at sink and tilted forward to enable her to see herself in mirror. Pt unable to sit up straight and pushing herself to her L.  Cued pt to pull herself to her R but she said "I am already leaning to my right".  Tried to use mirror for reference but pt unable to visually attend.  Max A to wash UB and total A to don shirt.   Pt stated " I need to pee".  Removed pt's gown to see she was incontinent of loose bowel and it had leaked out of her brief all over the w.c cushion.  +2 a from nurse tech to get pt out of chair, to stedy, to the bed with TOTAL A as pt leaning severely to her left, unable to respond to verbal and tactile cues. +2 to cleanse pt, change brief, don pants from bed level. For rolling, cued pt to support her L arm at the elbow with her R arm and she followed directions well.  Encouraged pt to actively use her R leg when we were adjusting her up in the bed. Pt resting in bed with alarm set, positioned with pillows. All needs met. Bed alarm set.    Therapy Documentation Precautions:  Precautions Precautions: Fall Precaution Comments: R hemicraniectomy- skull in R abdomen, L  shoulder subluxation, L lateral lean and inattention, needs helmet for OOB, L weakness Restrictions Weight Bearing Restrictions: No  Pain: Pain Assessment Pain Scale: 0-10 Pain Score: 0-No pain   Therapy/Group: Individual Therapy  [place] 11/24/2019, 10:15 AM

## 2019-11-24 NOTE — Progress Notes (Addendum)
Speech Language Pathology Daily Session Note  Patient Details  Name: Krystal Little MRN: [id number] Date of Birth: [birth year]  Today's Date: 11/24/2019 SLP Individual Time: 0932-1030 SLP Individual Time Calculation (min): 58 min  Short Term Goals: Week 1: SLP Short Term Goal 1 (Week 1): Pt will consume dys 1 textures and nectar thick liquids with min cues for use of swallowing precautions to clear residue from the oral cavity. SLP Short Term Goal 2 (Week 1): Pt will consume therapeutic trials of thin liquids with minimal overt s/s of aspiration and min cues for use of swallowing precautions over 3 consecutive sessions. SLP Short Term Goal 3 (Week 1): Pt will locate items needed for functional tasks at midline in >50% of opportunities with max assist multimodal cues. SLP Short Term Goal 4 (Week 1): Pt will sustain her attention to basic, familiar tasks for 3-5 minute intervals with mod cues needed for redirection. SLP Short Term Goal 5 (Week 1): Pt will complete basic, familiar tasks with mod assist multimodal cues for functional problem solving SLP Short Term Goal 6 (Week 1): Pt will utilize an increased vocal intensity and slow rate of speech to achieve intelligibility at the phrase/short sentence level with min assist verbal cues.  Skilled Therapeutic Interventions: Pt was seen for skilled ST targeting dysphagia and cognition. Pt with no overt s/sx aspiration across nectar liquids via straw or puree solids, however moderate anterior loss of puree noted. Hand over hand assistance and verbal cueing provided for problem solving self feeding to minimize anterior loss (placement of food at midline or directed to right side of oral cavity. Pt unable to execute accurately with some degree of tactile assist with spoon placement. Recommend continue current diet. Pt required overall Max A multimodal cueing to locate items at midline and left of midline throughout session. Moderate cues also required for  sustained attention and initiation during structured tasks. Mod A verbal and visual cues provided for problem solving and error awareness when matching colored blocks to a color pattern board. Pt expressed need to void, however found to be incontinent of bowel and bladder before pt could be transferred. With RN's assistance with hygiene, ST provided overall Max A multimodal cueing for initiation and sequencing steps of bed mobility. Pt left laying in bed with alarm set and needs within reach. Continue per current plan of care.        Pain Pain Assessment Pain Score: 0-No pain Faces Pain Scale: Hurts a little bit Pain Type: Surgical pain(surgical site/staples) Pain Location: Head Pain Orientation: Right Pain Descriptors / Indicators: Aching Pain Intervention(s): Emotional support;Distraction(pt denied offer to ask RN for pain medication)  Therapy/Group: Individual Therapy  [name] 11/24/2019, 7:04 AM

## 2019-11-24 NOTE — Progress Notes (Signed)
Orthopedic Tech Progress Note Patient Details:  Krystal Little 07-12-1956 [id number] Applied while therapy was working with patient. Ortho Devices Type of Ortho Device: Shoulder immobilizer Ortho Device/Splint Location: LUE Ortho Device/Splint Interventions: Ordered, Adjustment   Post Interventions Patient Tolerated: Well Instructions Provided: Adjustment of device, Care of device, Poper ambulation with device   Krystal Little 11/24/2019, 11:55 AM

## 2019-11-24 NOTE — Progress Notes (Signed)
Physical Therapy Session Note  Patient Details  Name: Krystal Little MRN: [id number] Date of Birth: 1956-03-20  Today's Date: 11/24/2019 PT Individual Time: 0800-0840 PT Individual Time Calculation (min): 40 min   Short Term Goals: Week 1:  PT Short Term Goal 1 (Week 1): Patient to be able to maintain midline sitting with no more than MinA and Min cues PT Short Term Goal 2 (Week 1): Patient to be able to tolerate standing in standing frame or stedy for at least 5 minutes PT Short Term Goal 3 (Week 1): Patient to require only Min cues to correct L lateral lean in sitting or standing PT Short Term Goal 4 (Week 1): Patient to be able to transfer with MinAx2 in stedy  Skilled Therapeutic Interventions/Progress Updates:    Patient received in bed, more awake today and able to answer simple questions from therapist more consistently, also demonstrates increased active movement in L LE today as well. On bedpan and completely soiled so focused session on rolling back and forth in bed- totalA for pericare today and required heavy maxA for rolling side to side this morning. Took quite a bit of time to fully clean her as she kept actively having small amounts of liquid bowel movement. No +2 help available so focused on bed level session. Otherwise worked on muscle activation of L LE- able to much more active motion from quads, hamstrings, and ankle plantar flexors today. Left in bed positioned to comfort with all needs met, bed alarm active.   Therapy Documentation Precautions:  Precautions Precautions: Fall Precaution Comments: R hemicraniectomy- skull in R abdomen, L shoulder subluxation, L lateral lean and inattention, needs helmet for OOB, L weakness Restrictions Weight Bearing Restrictions: No Pain: Pain Assessment Pain Scale: Faces Pain Score: 0-No pain Faces Pain Scale: No hurt     Therapy/Group: Individual Therapy   Windell Norfolk, DPT, PN1   Supplemental Physical Therapist [place]    Pager [phone] Acute Rehab Office [phone]    11/24/2019, 12:16 PM

## 2019-11-24 NOTE — Progress Notes (Signed)
Transitions of Care Pharmacist Note  Krystal Little is a 64 y.o. female that has been diagnosed with A Fib and will be prescribed Eliquis (apixaban) at discharge.   Patient Education: I provided the following education on Eliquis 11/24/19 to the patient: How to take the medication Described what the medication is Signs of bleeding Signs/symptoms of VTE and stroke  Answered their questions  Discharge Medications Plan: The patient wants to have their discharge medications filled by the Transitions of Care pharmacy rather than their usual pharmacy.  The discharge orders pharmacy has been changed to the Transitions of Care pharmacy, the patient will receive a phone call regarding co-pay, and their medications will be delivered by the Transitions of Care pharmacy.    Thank you,   Sherren Kerns, PharmD PGY1 Acute Care Pharmacy Resident Nov 24, 2019

## 2019-11-25 ENCOUNTER — Inpatient Hospital Stay (HOSPITAL_COMMUNITY): Payer: Medicare Other | Admitting: Physical Therapy

## 2019-11-25 ENCOUNTER — Inpatient Hospital Stay (HOSPITAL_COMMUNITY): Payer: Medicare Other | Admitting: Occupational Therapy

## 2019-11-25 ENCOUNTER — Inpatient Hospital Stay (HOSPITAL_COMMUNITY): Payer: Medicare Other | Admitting: Speech Pathology

## 2019-11-25 DIAGNOSIS — I10 Essential (primary) hypertension: Secondary | ICD-10-CM

## 2019-11-25 LAB — GLUCOSE, CAPILLARY
Glucose-Capillary: 204 mg/dL — ABNORMAL HIGH (ref 70–99)
Glucose-Capillary: 266 mg/dL — ABNORMAL HIGH (ref 70–99)
Glucose-Capillary: 164 mg/dL — ABNORMAL HIGH (ref 70–99)

## 2019-11-25 NOTE — Progress Notes (Signed)
Speech Language Pathology Daily Session Note  Patient Details  Name: Krystal Little MRN: [id number] Date of Birth: 01/17/1956  Today's Date: 11/25/2019 SLP Individual Time: 4098-1191 SLP Individual Time Calculation (min): 45 min  Short Term Goals: Week 1: SLP Short Term Goal 1 (Week 1): Pt will consume dys 1 textures and nectar thick liquids with min cues for use of swallowing precautions to clear residue from the oral cavity. SLP Short Term Goal 2 (Week 1): Pt will consume therapeutic trials of thin liquids with minimal overt s/s of aspiration and min cues for use of swallowing precautions over 3 consecutive sessions. SLP Short Term Goal 3 (Week 1): Pt will locate items needed for functional tasks at midline in >50% of opportunities with max assist multimodal cues. SLP Short Term Goal 4 (Week 1): Pt will sustain her attention to basic, familiar tasks for 3-5 minute intervals with mod cues needed for redirection. SLP Short Term Goal 5 (Week 1): Pt will complete basic, familiar tasks with mod assist multimodal cues for functional problem solving SLP Short Term Goal 6 (Week 1): Pt will utilize an increased vocal intensity and slow rate of speech to achieve intelligibility at the phrase/short sentence level with min assist verbal cues.  Skilled Therapeutic Interventions:  Pt was seen for skilled ST targeting dysphagia and cognitive goals. Pt was limited by fatigue, requiring extra time and tactile stimulation (wet wash rag to face, soft touch to hand) in order to achieve appropriate level of arousal for participation. She did achieve appropriately alert state but became increasingly fatigued throughout session, ultimately resulting in 15 missed minutes of skilled ST.  When pt was fully alert, she performed oral care with suction toothbrush with Min A verbal cues for accurate hand placement for use of suction function. She then accepted upgraded trails of thin H2O, self fed via straw (per last MBSS  recommendations). She exhibited only 1 instance of wet vocal quality at beginning of consumption of around [facility]. Pt utilized small, slow sips and maintained neutral head position with bed set in chair position with only 1 initial verbal cue. She politely decline advanced solid trials. SLP reinforced request for participation as she is able to work toward advancement. Recommend continue current diet for now. Pt also expressed difficulty using call bell to request assistance overnight due to what she perceived as her visual impairments. SLP provided Mod A verbal and visual cues for problem solving accurate use of call bell with tactile strategy. Ultimately, textured tape applied to the "help" button to assist with more functional use. Pt left laying in bed with alarm set and needs within reach. Continue per current plan of care.      Pain Pain Assessment Pain Scale: 0-10 Pain Score: 0-No pain  Therapy/Group: Individual Therapy  Krystal Little 11/25/2019, 7:23 AM

## 2019-11-25 NOTE — Progress Notes (Signed)
Physical Therapy Session Note  Patient Details  Name: Krystal Little MRN: [id number] Date of Birth: 07/23/1955  Today's Date: 11/25/2019 PT Individual Time: 0800-0855 PT Individual Time Calculation (min): 55 min   Short Term Goals: Week 1:  PT Short Term Goal 1 (Week 1): Patient to be able to maintain midline sitting with no more than MinA and Min cues PT Short Term Goal 2 (Week 1): Patient to be able to tolerate standing in standing frame or stedy for at least 5 minutes PT Short Term Goal 3 (Week 1): Patient to require only Min cues to correct L lateral lean in sitting or standing PT Short Term Goal 4 (Week 1): Patient to be able to transfer with MinAx2 in stedy  Skilled Therapeutic Interventions/Progress Updates:    Patient received in bed, alert and able to answer simple questions from therapist this morning. Worked on brushing teeth from bed level with close supervision and cues to clear L side of mouth/get all toothpaste out of mouth, otherwise worked on activation of L LE in supine- patient actually able to participate in heel slides, supine hip ABD/ADD, SAQs, ankle pumps, and small SLR with min-modA depending on difficulty of exercise! Applied sling in bed with HOB elevated, then able to get to EOB with totalA of 1 but continues to require at least [place] of 2 and stedy to perform sit to stand and pivots to chair, but with much less L lateral lean and pushing this morning. Positioned to comfort in chair with all needs met, NT present and directly attending.   Therapy Documentation Precautions:  Precautions Precautions: Fall Precaution Comments: R hemicraniectomy- skull in R abdomen, L shoulder subluxation, L lateral lean and inattention, needs helmet for OOB, L weakness Restrictions Weight Bearing Restrictions: No   Pain: Pain Assessment Pain Scale: 0-10 Pain Score: 0-No pain    Therapy/Group: Individual Therapy   Windell Norfolk, DPT, PN1   Supplemental Physical  Therapist [place]    Pager [phone] Acute Rehab Office [phone]    11/25/2019, 12:45 PM

## 2019-11-25 NOTE — Progress Notes (Signed)
Physical Therapy Session Note  Patient Details  Name: Krystal Little MRN: [id number] Date of Birth: 1956/06/24  Today's Date: 11/25/2019 PT Individual Time: 3846-6599 PT Individual Time Calculation (min): 28 min   Short Term Goals: Week 1:  PT Short Term Goal 1 (Week 1): Patient to be able to maintain midline sitting with no more than MinA and Min cues PT Short Term Goal 2 (Week 1): Patient to be able to tolerate standing in standing frame or stedy for at least 5 minutes PT Short Term Goal 3 (Week 1): Patient to require only Min cues to correct L lateral lean in sitting or standing PT Short Term Goal 4 (Week 1): Patient to be able to transfer with MinAx2 in stedy  Skilled Therapeutic Interventions/Progress Updates:    Patient received in bed finishing lunch with NT, very fatigued this afternoon. Needed heavy totalA for supine to sit and donned sling in sitting today; otherwise performed multiple sit to stands in stedy- required Max-totalAx2 in stedy and demonstrated heavy L lateral lean as well as inability to maintain trunk/cervical extension due to fatigue. Required MaxA to maintain upright midline sitting at EOB due to fatigue and heavy L lean and pushing. Needed 2 helpers to return to bed and position to comfort. Left in bed with all needs met, bed alarm active this afternoon.   Therapy Documentation Precautions:  Precautions Precautions: Fall Precaution Comments: R hemicraniectomy- skull in R abdomen, L shoulder subluxation, L lateral lean and inattention, needs helmet for OOB, L weakness Restrictions Weight Bearing Restrictions: No   Pain: Pain Assessment Pain Scale: Faces Pain Score: 6  Pain Type: Acute pain Pain Location: Shoulder Pain Orientation: Left Pain Descriptors / Indicators: Aching;Sore Pain Onset: On-going Patients Stated Pain Goal: 0 Pain Intervention(s): Repositioned    Therapy/Group: Individual Therapy   Windell Norfolk, DPT, PN1   Supplemental Physical  Therapist [place]    Pager [phone] Acute Rehab Office [phone]    11/25/2019, 3:37 PM

## 2019-11-25 NOTE — Plan of Care (Signed)
  Problem: RH SAFETY Goal: RH STG ADHERE TO SAFETY PRECAUTIONS W/ASSISTANCE/DEVICE Description: STG Adhere to Safety Precautions With supervision Assistance/Device. Outcome: Progressing   Problem: RH BLADDER ELIMINATION Goal: RH STG MANAGE BLADDER WITH ASSISTANCE Description: STG Manage Bladder With min Assistance Outcome: Progressing

## 2019-11-25 NOTE — Progress Notes (Signed)
Occupational Therapy Session Note  Patient Details  Name: Krystal Little MRN: [id number] Date of Birth: 1955/10/17  Today's Date: 11/25/2019 OT Individual Time: 7654-6503 OT Individual Time Calculation (min): 60 min    Short Term Goals: Week 1:  OT Short Term Goal 1 (Week 1): Pt will be able to actively turn her head to the left with min Cues. OT Short Term Goal 2 (Week 1): Pt will be able to wash her L arm with mod A. OT Short Term Goal 3 (Week 1): Pt will be able to sit to EOB with mod A of 1. OT Short Term Goal 4 (Week 1): Pt will be able to rise to stand in stedy lift with max A of 1. OT Short Term Goal 5 (Week 1): Pt will be able to self feed with min A.  Skilled Therapeutic Interventions/Progress Updates:    Pt seen this session to facilitate postural control, L visual scanning, LUE NMR.  Pt received in tilt and space w.c and taken to gym.  Pt c/o helmet rubbing her R ear. Placed foam padding inside helmet.   With +2 A pt worked on reciprocal scoot forward in w.c to allow feet to touch floor, squat pivot to mat with total A and sitting balance on mat.   Pt leaning to L so had her place R forearm on tray table at chest height. This tactile/perceptual feedback allowed her to sit statically with min A.   In this position worked on: -upright posture with retracting shoulders and lifting chest with min A -LUE wt bearing on hand on mat, adding in small elbow flexion and extension with full A to achieve extension.   -LUE on ball to facilitate rolling ball forward and back (observed trace scapular elevation and retraction) -LUE on tray table to move towel forward and back with full A and elbow flex/ext PROM, pt was able to actively flex elbow 3x 20-30 degrees in gravity eliminated positon -L visual scanning and focus to attend to L arm  Sit to stand 2x with max A from EOM with pt using R leg well to push up to stand, max cues to lift chest to extend spine but pt unable to come to a full  stand.   Pt continues to hold R eye closed but denies diplopia.  Pt alert during session.     Completed squat pivot back to w.c using reciprocal scoots with total A to get back into seat.  Pt reported need to toilet.    Stand pivot transfer to [facility] with +2 (3rd person A with clothing).  Pt was continent and brief was dry.  Nursing called in to assist pt as therapy session was over.  Nursing staff with patient.   Therapy Documentation Precautions:  Precautions Precautions: Fall Precaution Comments: R hemicraniectomy- skull in R abdomen, L shoulder subluxation, L lateral lean and inattention, needs helmet for OOB, L weakness Restrictions Weight Bearing Restrictions: No Pain: Pain Assessment Pain Scale: 0-10 Pain Score: 0-No pain   Therapy/Group: Individual Therapy  [place] 11/25/2019, 12:50 PM

## 2019-11-25 NOTE — Progress Notes (Signed)
[place] PHYSICAL MEDICINE & REHABILITATION PROGRESS NOTE  Subjective/Complaints: Patient seen laying in bed this AM.  She states she slept well overnight.  [name].    ROS: Denies CP, SOB, N/V/D  Objective: Vital Signs: Blood pressure (!) 161/94, pulse 63, temperature 98.8 F (37.1 C), resp. rate 15, height 5\' 4"  (1.626 m), weight 62.2 kg, SpO2 97 %. No results found. No results for input(s): WBC, HGB, HCT, PLT in the last 72 hours. Recent Labs    11/24/19 0555  NA 140  K 3.9  CL 105  CO2 26  GLUCOSE 179*  BUN 12  CREATININE 0.52  CALCIUM 8.7*    Physical Exam: BP (!) 161/94 (BP Location: Right Arm)   Pulse 63   Temp 98.8 F (37.1 C)   Resp 15   Ht 5\' 4"  (1.626 m)   Wt 62.2 kg   SpO2 97%   BMI 23.54 kg/m   Constitutional: No distress . Vital signs reviewed. HENT: Craniectomy on right with staples C/D/I +NG Eyes: EOMI. No discharge. Cardiovascular: No JVD. Respiratory: Normal effort.  No stridor. GI: Non-distended. Bone flap RLE with staples C/D/I Skin: See above Psych: Flat. Slowed. Musc: No edema in extremities.  No tenderness in extremities. Neurological: Alert Motor: RUE: 4/5 proximal distal RLE: 4/5 proximal distal LUE: 0/5 proximal distal, unchanged LLE: Hip flexion, knee extension 2/5, wiggles toes with apraxia, stable Left facial weakness Dysarthria, unchanged  Assessment/Plan: 1. 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  Physiatrist is providing close team supervision and 24 hour management of active medical problems listed below.  Physiatrist and rehab team continue to assess barriers to discharge/monitor patient progress toward functional and medical goals  Care Tool:  Bathing     Body parts bathed by patient: Chest, Face   Body parts bathed by helper: Right arm, Left arm, Front perineal area, Buttocks, Right upper leg, Left upper leg, Right lower leg, Left lower leg, Abdomen     Bathing assist Assist Level: 2 Helpers     Upper Body Dressing/Undressing Upper body dressing   What is the patient wearing?: Pull over shirt    Upper body assist Assist Level: Total Assistance - Patient < 25%    Lower Body Dressing/Undressing Lower body dressing      What is the patient wearing?: Incontinence brief, Pants     Lower body assist Assist for lower body dressing: 2 Helpers     Toileting Toileting    Toileting assist Assist for toileting: Total Assistance - Patient < 25%     Transfers Chair/bed transfer  Transfers assist     Chair/bed transfer assist level: Dependent - mechanical lift     Locomotion Ambulation   Ambulation assist   Ambulation activity did not occur: Safety/medical concerns(lethargy)          Walk 10 feet activity   Assist  Walk 10 feet activity did not occur: Safety/medical concerns(lethargy)        Walk 50 feet activity   Assist Walk 50 feet with 2 turns activity did not occur: Safety/medical concerns(lethargy)         Walk 150 feet activity   Assist Walk 150 feet activity did not occur: Safety/medical concerns(lethargy)         Walk 10 feet on uneven surface  activity  Assist Walk 10 feet on uneven surfaces activity did not occur: Safety/medical concerns(lethargy)         Wheelchair     Assist Will patient use wheelchair at discharge?: Yes Type of Wheelchair: (TBD)    Wheelchair assist level: Dependent - Patient 0%      Wheelchair 50 feet with 2 turns activity    Assist        Assist Level: Dependent - Patient 0%   Wheelchair 150 feet activity     Assist     Assist Level: Dependent - Patient 0%    Medical Problem List and Plan: 1. Left-sided hemiparesis and  right gaze deviation secondary to large right MCA infarct due to right ICA occlusion status post revascularization/endovascular thrombectomy complicated by worsening edema and mass-effect status post decompressive right hemicraniectomy placement of bone flap into abdominal subcutaneous pocket 11/02/2019.    Repeat head CT personally reviewed, and reviewed with patient, showing some improvement  Helmet for safety when OOB.  Continue CIR  WHO/PRAFO  Communicated with therapies, shoulder sling ordered. 2.  Antithrombotics:  -DVT/anticoagulation:    Discussed with neurology, started Eliquis             -antiplatelet therapy: Aspirin 325 mg daily initiated 11/17/2019 3. Pain Management: Tylenol as needed 4. Mood: Amantadine 100 mg twice daily             -antipsychotic agents: N/A 5. Neuropsych: This patient is not fully capable of making decisions on her own behalf. 6. Skin/Wound Care: Routine skin checks 7. Fluids/Electrolytes/Nutrition: Routine in and outs.    BMP within acceptable range, except for glucose on 5/10 8.  Atrial fibrillation/pacemaker.    Failed Coumadin, started Eliquis  Cardiac rate controlled.    Continue Cardizem 60 mg every 6 hours as well as Lopressor 25 mg twice daily.    Follow-up cardiology services as needed 9.  Acute blood loss anemia.    Hemoglobin 11.4 on 5/7  Continue to monitor 10. Uncontrolled diabetes mellitus type 2 with hyperglycemia.  Hemoglobin A1c 9.7.    NovoLog 8 units with meals  Lantus insulin 21 units twice daily.    Check blood sugars before meals and at bedtime.  Labile on 5/11 11.  Post stroke dysphagia.  Dysphagia #1 nectar liquids.  Follow-up speech therapy  Continue nocturnal tube feeds    Reduced glucerna TF to 25cc/hr to stimulate appetite 12.  Hyperlipidemia.  Lipitor 13. Essential HTN  Labile on 5/11, monitor for trend  See #8   LOS: 5 days A FACE TO FACE EVALUATION WAS PERFORMED  Darby Shadwick Lorie [place]ix 11/25/2019, 10:55 AM

## 2019-11-25 NOTE — Discharge Instructions (Signed)
Inpatient Rehab Discharge Instructions  Krystal Little Discharge date and time: No discharge date for patient encounter.   Activities/Precautions/ Functional Status: Activity: activity as tolerated Diet:  Wound Care: keep wound clean and dry Functional status:  ___ No restrictions     ___ Walk up steps independently ___ 24/7 supervision/assistance   ___ Walk up steps with assistance ___ Intermittent supervision/assistance  ___ Bathe/dress independently ___ Walk with walker     _x__ Bathe/dress with assistance ___ Walk Independently    ___ Shower independently ___ Walk with assistance    ___ Shower with assistance ___ No alcohol     ___ Return to work/school ________  Special Instructions: No driving smoking or alcohol  Continue safety helmet as directed STROKE/TIA DISCHARGE INSTRUCTIONS SMOKING Cigarette smoking nearly doubles your risk of having a stroke & is the single most alterable risk factor  If you smoke or have smoked in the last 12 months, you are advised to quit smoking for your health.  Most of the excess cardiovascular risk related to smoking disappears within a year of stopping.  Ask you doctor about anti-smoking medications  [place] Quit Line: 1-800-QUIT NOW  Free Smoking Cessation Classes [phone]  CHOLESTEROL Know your levels; limit fat & cholesterol in your diet  Lipid Panel     Component Value Date/Time   CHOL 239 (H) 11/01/2019 0348   CHOL 212 (H) 07/15/2018 0907   TRIG 102 11/04/2019 0639   HDL 69 11/01/2019 0348   HDL 50 07/15/2018 0907   CHOLHDL 3.5 11/01/2019 0348   VLDL 12 11/01/2019 0348   LDLCALC 158 (H) 11/01/2019 0348   LDLCALC 138 (H) 07/15/2018 0907      Many patients benefit from treatment even if their cholesterol is at goal.  Goal: Total Cholesterol (CHOL) less than 160  Goal:  Triglycerides (TRIG) less than 150  Goal:  HDL greater than 40  Goal:  LDL (LDLCALC) less than 100   BLOOD PRESSURE American Stroke Association blood  pressure target is less that 120/80 mm/Hg  Your discharge blood pressure is:  BP: (!) 149/65  Monitor your blood pressure  Limit your salt and alcohol intake  Many individuals will require more than one medication for high blood pressure  DIABETES (A1c is a blood sugar average for last 3 months) Goal HGBA1c is under 7% (HBGA1c is blood sugar average for last 3 months)  Diabetes:    Lab Results  Component Value Date   HGBA1C 9.7 (H) 11/01/2019     Your HGBA1c can be lowered with medications, healthy diet, and exercise.  Check your blood sugar as directed by your physician  Call your physician if you experience unexplained or low blood sugars.  PHYSICAL ACTIVITY/REHABILITATION Goal is 30 minutes at least 4 days per week  Activity: Increase activity slowly, Therapies: Physical Therapy: Home Health Return to work:   Activity decreases your risk of heart attack and stroke and makes your heart stronger.  It helps control your weight and blood pressure; helps you relax and can improve your mood.  Participate in a regular exercise program.  Talk with your doctor about the best form of exercise for you (dancing, walking, swimming, cycling).  DIET/WEIGHT Goal is to maintain a healthy weight  Your discharge diet is:  Diet Order            DIET - DYS 1 Room service appropriate? Yes; Fluid consistency: Nectar Thick  Diet effective now  liquids Your height is:  Height: 5\' 4"  (162.6 cm) Your current weight is: Weight: 66.7 kg Your Body Mass Index (BMI) is:  BMI (Calculated): 25.23  Following the type of diet specifically designed for you will help prevent another stroke.  Your goal weight range is:    Your goal Body Mass Index (BMI) is 19-24.  Healthy food habits can help reduce 3 risk factors for stroke:  High cholesterol, hypertension, and excess weight.  RESOURCES Stroke/Support Group:  Call [phone]   STROKE EDUCATION PROVIDED/REVIEWED AND GIVEN TO PATIENT  Stroke warning signs and symptoms How to activate emergency medical system (call 911). Medications prescribed at discharge. Need for follow-up after discharge. Personal risk factors for stroke. Pneumonia vaccine given:  Flu vaccine given:  My questions have been answered, the writing is legible, and I understand these instructions.  I will adhere to these goals & educational materials that have been provided to me after my discharge from the hospital.      My questions have been answered and I understand these instructions. I will adhere to these goals and the provided educational materials after my discharge from the hospital.  Patient/Caregiver Signature _______________________________ Date __________  Clinician Signature _______________________________________ Date __________  Please bring this form and your medication list with you to all your follow-up doctor's appointments.   ====================================================== Information on my medicine - ELIQUIS (apixaban)  This medication education was reviewed with me or my healthcare representative as part of my discharge preparation.  The pharmacist that spoke with me during my hospital stay was:  Onnie Boer, RPH-CPP  Why was Eliquis prescribed for you? Eliquis was prescribed for you to reduce the risk of a blood clot forming that can cause a stroke if you have a medical condition called atrial fibrillation (a type of irregular heartbeat).  What do You need to know about Eliquis ? Take your Eliquis TWICE DAILY - one tablet in the morning and one tablet in the evening with or without food. If you have difficulty swallowing the tablet whole please discuss with your pharmacist how to take the medication safely.  Take Eliquis exactly as prescribed by your doctor and DO NOT stop taking Eliquis without talking to the doctor who prescribed the medication.  Stopping may increase your risk of developing a stroke.  Refill  your prescription before you run out.  After discharge, you should have regular check-up appointments with your healthcare provider that is prescribing your Eliquis.  In the future your dose may need to be changed if your kidney function or weight changes by a significant amount or as you get older.  What do you do if you miss a dose? If you miss a dose, take it as soon as you remember on the same day and resume taking twice daily.  Do not take more than one dose of ELIQUIS at the same time to make up a missed dose.  Important Safety Information A possible side effect of Eliquis is bleeding. You should call your healthcare provider right away if you experience any of the following: ? Bleeding from an injury or your nose that does not stop. ? Unusual colored urine (red or dark brown) or unusual colored stools (red or black). ? Unusual bruising for unknown reasons. ? A serious fall or if you hit your head (even if there is no bleeding).  Some medicines may interact with Eliquis and might increase your risk of bleeding or clotting while on Eliquis. To help avoid this, consult  your healthcare provider or pharmacist prior to using any new prescription or non-prescription medications, including herbals, vitamins, non-steroidal anti-inflammatory drugs (NSAIDs) and supplements.  This website has more information on Eliquis (apixaban): http://www.eliquis.com/eliquis/home  ===================================================== Atrial Fibrillation    Atrial fibrillation is a type of heartbeat that is irregular or fast. If you have this condition, your heart beats without any order. This makes it hard for your heart to pump blood in a normal way. Atrial fibrillation may come and go, or it may become a long-lasting problem. If this condition is not treated, it can put you at higher risk for stroke, heart failure, and other heart problems.  What are the causes? This condition may be caused by  diseases that damage the heart. They include:  High blood pressure.  Heart failure.  Heart valve disease.  Heart surgery. Other causes include:  Diabetes.  Thyroid disease.  Being overweight.  Kidney disease. Sometimes the cause is not known.  What increases the risk? You are more likely to develop this condition if:  You are older.  You smoke.  You exercise often and very hard.  You have a family history of this condition.  You are a man.  You use drugs.  You drink a lot of alcohol.  You have lung conditions, such as emphysema, pneumonia, or COPD.  You have sleep apnea.   What are the signs or symptoms? Common symptoms of this condition include:  A feeling that your heart is beating very fast.  Chest pain or discomfort.  Feeling short of breath.  Suddenly feeling light-headed or weak.  Getting tired easily during activity.  Fainting.  Sweating. In some cases, there are no symptoms.  How is this treated? Treatment for this condition depends on underlying conditions and how you feel when you have atrial fibrillation. They include: 1. Medicines to: ? Prevent blood clots. ? Treat heart rate or heart rhythm problems. 2. Using devices, such as a pacemaker, to correct heart rhythm problems. 3. Doing surgery to remove the part of the heart that sends bad signals. 4. Closing an area where clots can form in the heart (left atrial appendage). In some cases, your doctor will treat other underlying conditions.  Follow these instructions at home:  Medicines 1. Take over-the-counter and prescription medicines only as told by your doctor. 2. Do not take any new medicines without first talking to your doctor. 3. If you are taking blood thinners: ? Talk with your doctor before you take any medicines that have aspirin or NSAIDs, such as ibuprofen, in them. ? Take your medicine exactly as told by your doctor. Take it at the same time each day. ? Avoid  activities that could hurt or bruise you. Follow instructions about how to prevent falls. ? Wear a bracelet that says you are taking blood thinners. Or, carry a card that lists what medicines you take. Lifestyle          Do not use any products that have nicotine or tobacco in them. These include cigarettes, e-cigarettes, and chewing tobacco. If you need help quitting, ask your doctor.  Eat heart-healthy foods. Talk with your doctor about the right eating plan for you.  Exercise regularly as told by your doctor.  Do not drink alcohol.  Lose weight if you are overweight.  Do not use drugs, including cannabis.  General instructions  If you have a condition that causes breathing to stop for a short period of time (apnea), treat it as told by  your doctor.  Keep a healthy weight. Do not use diet pills unless your doctor says they are safe for you. Diet pills may make heart problems worse.  Keep all follow-up visits as told by your doctor. This is important.  Contact a doctor if:  You notice a change in the speed, rhythm, or strength of your heartbeat.  You are taking a blood-thinning medicine and you get more bruising.  You get tired more easily when you move or exercise.  You have a sudden change in weight.  Get help right away if:    1. You have pain in your chest or your belly (abdomen). 2. You have trouble breathing. 3. You have side effects of blood thinners, such as blood in your vomit, poop (stool), or pee (urine), or bleeding that cannot stop. 4. You have any signs of a stroke. "BE FAST" is an easy way to remember the main warning signs: ? B - Balance. Signs are dizziness, sudden trouble walking, or loss of balance. ? E - Eyes. Signs are trouble seeing or a change in how you see. ? F - Face. Signs are sudden weakness or loss of feeling in the face, or the face or eyelid drooping on one side. ? A - Arms. Signs are weakness or loss of feeling in an arm. This happens  suddenly and usually on one side of the body. ? S - Speech. Signs are sudden trouble speaking, slurred speech, or trouble understanding what people say. ? T - Time. Time to call emergency services. Write down what time symptoms started. 5. You have other signs of a stroke, such as: ? A sudden, very bad headache with no known cause. ? Feeling like you may vomit (nausea). ? Vomiting. ? A seizure.  These symptoms may be an emergency. Do not wait to see if the symptoms will go away. Get medical help right away. Call your local emergency services (911 in the [place] Do not drive yourself to the hospital. Summary  Atrial fibrillation is a type of heartbeat that is irregular or fast.  You are at higher risk of this condition if you smoke, are older, have diabetes, or are overweight.  Follow your doctor's instructions about medicines, diet, exercise, and follow-up visits.  Get help right away if you have signs or symptoms of a stroke.  Get help right away if you cannot catch your breath, or you have chest pain or discomfort. This information is not intended to replace advice given to you by your health care provider. Make sure you discuss any questions you have with your health care provider. Document Revised: 12/25/2018 Document Reviewed: 12/25/2018 Elsevier Patient Education  [place].

## 2019-11-26 ENCOUNTER — Inpatient Hospital Stay (HOSPITAL_COMMUNITY): Payer: Medicare Other | Admitting: Speech Pathology

## 2019-11-26 ENCOUNTER — Inpatient Hospital Stay (HOSPITAL_COMMUNITY): Payer: Medicare Other | Admitting: Physical Therapy

## 2019-11-26 ENCOUNTER — Inpatient Hospital Stay (HOSPITAL_COMMUNITY): Payer: Medicare Other | Admitting: Occupational Therapy

## 2019-11-26 LAB — GLUCOSE, CAPILLARY
Glucose-Capillary: 211 mg/dL — ABNORMAL HIGH (ref 70–99)
Glucose-Capillary: 311 mg/dL — ABNORMAL HIGH (ref 70–99)
Glucose-Capillary: 279 mg/dL — ABNORMAL HIGH (ref 70–99)
Glucose-Capillary: 304 mg/dL — ABNORMAL HIGH (ref 70–99)

## 2019-11-26 NOTE — Progress Notes (Signed)
[place] PHYSICAL MEDICINE & REHABILITATION PROGRESS NOTE  Subjective/Complaints: Patient seen laying in bed this morning.  [name].  Patient states she slept well overnight.  Daughter has questions regarding discharge.  Patient states her throat feels mildly irritated from NG.  Discussed nutrition with patient and daughter.  ROS: Denies CP, SOB, N/V/D  Objective: Vital Signs: Blood pressure (!) 165/79, pulse 69, temperature 99.1 F (37.3 C), temperature source Oral, resp. rate 18, height 5\' 4"  (1.626 m), weight 62.2 kg, SpO2 99 %. No results found. No results for input(s): WBC, HGB, HCT, PLT in the last 72 hours. Recent Labs    11/24/19 0555  NA 140  K 3.9  CL 105  CO2 26  GLUCOSE 179*  BUN 12  CREATININE 0.52  CALCIUM 8.7*    Physical Exam: BP (!) 165/79 (BP Location: Right Arm)   Pulse 69   Temp 99.1 F (37.3 C) (Oral)   Resp 18   Ht 5\' 4"  (1.626 m)   Wt 62.2 kg   SpO2 99%   BMI 23.54 kg/m   Constitutional: No distress . Vital signs reviewed. HENT: Craniectomy on right with staples C/D/I + NG Eyes: EOMI. No discharge. Cardiovascular: No JVD. Respiratory: Normal effort.  No stridor. GI: Non-distended. Bone flap right lower quadrant with staples C/D/I Skin: See above Psych: Flat.  Delayed. Musc: No edema in extremities.  No tenderness in extremities. Neurological: Alert Motor: RUE: 4/5 proximal distal RLE: 4/5 proximal distal LUE: 0/5 proximal distal, stable LLE: Hip flexion, knee extension 2/5, wiggles toes with apraxia, stable Left facial weakness Dysarthria, stable  Assessment/Plan: 1. 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  Physiatrist is providing close team supervision and 24 hour  management of active medical problems listed below.  Physiatrist and rehab team continue to assess barriers to discharge/monitor patient progress toward functional and medical goals  Care Tool:  Bathing    Body parts bathed by patient: Chest, Face   Body parts bathed by helper: Right arm, Left arm, Front perineal area, Buttocks, Right upper leg, Left upper leg, Right lower leg, Left lower leg, Abdomen     Bathing assist Assist Level: 2 Helpers     Upper Body Dressing/Undressing Upper body dressing   What is the patient wearing?: Pull over shirt    Upper body assist Assist Level: Total Assistance - Patient < 25%    Lower Body Dressing/Undressing Lower body dressing      What is the patient wearing?: Incontinence brief     Lower body assist Assist for lower body dressing: 2 Helpers     Toileting Toileting    Toileting assist Assist for toileting: 2 Helpers     Transfers Chair/bed transfer  Transfers assist     Chair/bed transfer assist level: Dependent - mechanical lift     Locomotion Ambulation   Ambulation assist   Ambulation activity did not occur: Safety/medical concerns          Walk 10 feet activity   Assist  Walk 10 feet activity did not occur: Safety/medical concerns        Walk 50 feet activity   Assist Walk 50 feet with 2 turns activity did not occur: Safety/medical concerns         Walk 150 feet activity   Assist Walk 150 feet  activity did not occur: Safety/medical concerns(lethargy)         Walk 10 feet on uneven surface  activity   Assist Walk 10 feet on uneven surfaces activity did not occur: Safety/medical concerns(lethargy)         Wheelchair     Assist Will patient use wheelchair at discharge?: Yes Type of Wheelchair: (TBD)    Wheelchair assist level: Dependent - Patient 0%      Wheelchair 50 feet with 2 turns activity    Assist        Assist Level: Dependent - Patient 0%    Wheelchair 150 feet activity     Assist     Assist Level: Dependent - Patient 0%    Medical Problem List and Plan: 1. 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    Repeat head CT personally reviewed, and reviewed with patient, showing some improvement  Helmet for safety when OOB.  Continue CIR  WHO/PRAFO  Communicated with therapies, shoulder sling ordered.  Team conference today to discuss current and goals and coordination of care, home and environmental barriers, and discharge planning with nursing, case manager, and therapies.  2.  Antithrombotics:  -DVT/anticoagulation:    Discussed with neurology, started Eliquis             -antiplatelet therapy: Aspirin 325 mg daily initiated 11/17/2019 3. Pain Management: Tylenol as needed 4. Mood: Amantadine 100 mg twice daily             -antipsychotic agents: N/A 5. Neuropsych: This patient is not fully capable of making decisions on her own behalf. 6. Skin/Wound Care: Routine skin checks 7. Fluids/Electrolytes/Nutrition: Routine in and outs.    BMP within acceptable range, except for glucose on 5/10 8.  Atrial fibrillation/pacemaker.    Failed Coumadin, started Eliquis  Cardiac rate controlled.    Continue Cardizem 60 mg every 6 hours as well as Lopressor 25 mg twice daily.    Follow-up cardiology services as needed 9.  Acute blood loss anemia.    Hemoglobin 11.4 on 5/7  Continue to monitor 10. Uncontrolled diabetes mellitus type 2 with hyperglycemia.  Hemoglobin A1c 9.7.    NovoLog 8 units with meals  Lantus insulin 21 units twice daily.    Check blood sugars before meals and at bedtime.  Labile on 5/12 11.  Post stroke dysphagia.  Dysphagia  #1 nectar liquids.  Follow-up speech therapy  Variable Little.o. intake  TFs DC'd on 5/12, plan to DC NG tomorrow if able to maintain oral nutrition 12.  Hyperlipidemia.  Lipitor 13. Essential HTN  ?  Trending up on 5/12, will consider medication adjustments if persistent  See #8   LOS: 6 days A FACE TO FACE EVALUATION WAS PERFORMED  Krystal Little Krystal Phenix 11/26/2019, 9:17 AM

## 2019-11-26 NOTE — Progress Notes (Signed)
Physical Therapy Session Note  Patient Details  Name: Krystal Little MRN: [id number] Date of Birth: 1955/12/09  Today's Date: 11/26/2019 PT Individual Time: 1135-1200 PT Individual Time Calculation (min): 25 min   Short Term Goals: Week 1:  PT Short Term Goal 1 (Week 1): Patient to be able to maintain midline sitting with no more than MinA and Min cues PT Short Term Goal 2 (Week 1): Patient to be able to tolerate standing in standing frame or stedy for at least 5 minutes PT Short Term Goal 3 (Week 1): Patient to require only Min cues to correct L lateral lean in sitting or standing PT Short Term Goal 4 (Week 1): Patient to be able to transfer with MinAx2 in stedy  Skilled Therapeutic Interventions/Progress Updates:    Patient received up in TIS [facility], pleasant and willing to work with therapy today; daughter Jeannetta Nap present for session and invited down to gym to help motivate patient which did seem to result in improved performance today! Continues to be dependent for WC mobility, and required ModAx2 for sit to stands in stedy today but able to maintain upright standing for approximately 1-2  minutes twice with Min-ModAx2 and Mod-max cues for postural corrections and to reduce L lean. Had Miranda stand in front of patient and interact with her/cheer her on for increased motivation which helped to encourage patient to reach full upright posture much better today. Returned to room totalA in [facility] and left up in TIS [facility] with all needs met, safety seatbelt active and family present this morning.   Therapy Documentation Precautions:  Precautions Precautions: Fall Precaution Comments: R hemicraniectomy- skull in R abdomen, L shoulder subluxation, L lateral lean and inattention, needs helmet for OOB, L weakness Restrictions Weight Bearing Restrictions: No   Pain: Pain Assessment Pain Scale: Faces Pain Score: 0-No pain Faces Pain Scale: No hurt    Therapy/Group: Individual Therapy   Windell Norfolk, DPT, PN1   Supplemental Physical Therapist [place]    Pager [phone] Acute Rehab Office [phone]    11/26/2019, 12:42 PM

## 2019-11-26 NOTE — Progress Notes (Signed)
Occupational Therapy Session Note  Patient Details  Name: Krystal Little MRN: [id number] Date of Birth: October 06, 1955  Today's Date: 11/26/2019 OT Individual Time: 1305-1400 OT Individual Time Calculation (min): 55 min    Short Term Goals: Week 1:  OT Short Term Goal 1 (Week 1): Pt will be able to actively turn her head to the left with min Cues. OT Short Term Goal 2 (Week 1): Pt will be able to wash her L arm with mod A. OT Short Term Goal 3 (Week 1): Pt will be able to sit to EOB with mod A of 1. OT Short Term Goal 4 (Week 1): Pt will be able to rise to stand in stedy lift with max A of 1. OT Short Term Goal 5 (Week 1): Pt will be able to self feed with min A.  Skilled Therapeutic Interventions/Progress Updates:  Patient met seated in TIS wc in agreement with OT treatment session with focus on self-care re-education, NMR, and functional transfers as detailed below. Patient's husband present throughout treatment session. OT removed RUE orthosis for hand over hand assistance to hold magic cup. Patient with very flat affect. Frequent verbal/tactile cues for upright head throughout meal. Patient able to indicate when she was finished with meal and no longer wished to eat. Patient demonstrates L inattention when wiping/washing face requiring multimodal cueing for thoroughness. UB dressing to doff/don shirt with patient completing 0% of the task. Total A for wc transport to and from dayroom. Sit to stand x4 with use of stedy and +2 assist with L arm sling in place secondary to 2 finger subluxation. Patient with drastic L lateral lean requiring multimodal cueing for correction. Line drawn on mirror for orientation to midline. Session concluded with patient seated in TIS wc with call bell within reach, husband present at bedside, and all needs met.   Therapy Documentation Precautions:  Precautions Precautions: Fall Precaution Comments: R hemicraniectomy- skull in R abdomen, L shoulder subluxation, L  lateral lean and inattention, needs helmet for OOB, L weakness Restrictions Weight Bearing Restrictions: No  Therapy/Group: Individual Therapy  Benedetta Sundstrom [name] 11/26/2019, 12:31 PM

## 2019-11-26 NOTE — Progress Notes (Signed)
Physical Therapy Session Note  Patient Details  Name: Krystal Little MRN: [id number] Date of Birth: 1955-11-20  Today's Date: 11/26/2019 PT Individual Time: 1135-1200 PT Individual Time Calculation (min): 25 min   Short Term Goals: Week 1:  PT Short Term Goal 1 (Week 1): Patient to be able to maintain midline sitting with no more than MinA and Min cues PT Short Term Goal 2 (Week 1): Patient to be able to tolerate standing in standing frame or stedy for at least 5 minutes PT Short Term Goal 3 (Week 1): Patient to require only Min cues to correct L lateral lean in sitting or standing PT Short Term Goal 4 (Week 1): Patient to be able to transfer with MinAx2 in stedy  Skilled Therapeutic Interventions/Progress Updates: Pt presents supine in bed, fatigued and nursing beginning to change brief after incontinence.  PT assisted w/ turns and NT states no urine.  Pt states to PT that she needs to go and needs bedpan.  PT assists into hook-lying position to place bedpan, but urine noted in brief.  Pt assisted w/ turns using siderails for total assist for pericare and changing brief.  Pt rolls to B sides, but requires increased assist to right, unable to bring LUE across.  Pants donned and pt able to assist w/ pulling up, even attempting bridging to bring up past hips, but unable to clear.  Pt then assisted w/ Total assist to roll side to side w/ attempt in left side-lying to use RUE to pull pants over hips.  Pt performed sup to sit w/ total assist but pt assists using right UE after bringing LEs to EOB (AROM and verbal cues for RLE and AAROM to PROM LLE.  Pt sat EOB w/ max A initially d/t heavy pushing to left, but does decrease to CGA w/ right UE extended to side and eventually able to perform for short periods of 10-20 seconds before using visual and manual assist to regain mid-line sitting.  Pt performing upright posture w/ cervical flex/ext, rotation as well as reaching forward and to right w/ RUE, lists to  left.  Pt required max to total assist for sit to stand w/ Stedy.  Pt transferred to TIS w/c and tilted back and chair alarm placed w/ all needs in reach.  Daughter present in room throughout session and will remain in room.  Spoke w/ nursing and NT re: soiled brief and changed.     Therapy Documentation Precautions:  Precautions Precautions: Fall Precaution Comments: R hemicraniectomy- skull in R abdomen, L shoulder subluxation, L lateral lean and inattention, needs helmet for OOB, L weakness Restrictions Weight Bearing Restrictions: No General:   Vital Signs:  Pain:states no pain and does not appear to be in pain. Pain Assessment Pain Scale: Faces Pain Score: 0-No pain Faces Pain Scale: No hurt Mobility:      Therapy/Group: Individual Therapy  Krystal Little 11/26/2019, 12:49 PM

## 2019-11-26 NOTE — Progress Notes (Signed)
Team Conference Report to Patient/Family  Team Conference discussion was reviewed with the patient and caregiver, including goals, any changes in plan of care and target discharge date.  Patient and caregiver express understanding and are in agreement.  The patient has a target discharge date of 12/18/19  .  Dyanne Iha 11/26/2019, 2:48 PM

## 2019-11-26 NOTE — Plan of Care (Signed)
  Problem: Consults Goal: RH STROKE PATIENT EDUCATION Description: See Patient Education module for education specifics  Outcome: Progressing Goal: Nutrition Consult-if indicated Outcome: Progressing

## 2019-11-26 NOTE — Patient Care Conference (Signed)
Inpatient RehabilitationTeam Conference and Plan of Care Update Date: 11/26/2019   Time: 4:04 PM    Patient Name: Krystal Little      Medical Record Number: [id number]  Date of Birth: September 21, 1955 Sex: Female         Room/Bed: 4W14C/4W14C-02 Payor Info: Payor: [employer] MEDICARE / Plan: [facility] MEDICARE / Product Type: *No Product type* /    Admit Date/Time:  11/20/2019  6:17 PM  Primary Diagnosis:  Right middle cerebral artery stroke [facility])  Patient Active Problem List   Diagnosis Date Noted  . Benign essential HTN   . Labile blood glucose   . Status post craniectomy   . Dysphagia, post-stroke   . Atrial fibrillation ([place])   . Left hemiparesis ([place])   . Cerebral edema ([place]) 11/20/2019  . ICH (intracerebral hemorrhage), SAH ([place]) result of ischemic stroke s/p crani 11/20/2019  . Dysphagia due to recent cerebral infarction 11/20/2019  . Acute blood loss anemia 11/20/2019  . Right middle cerebral artery stroke ([place]) 11/20/2019  . History of ETT   . Hypokalemia   . Acute respiratory failure with hypoxemia ([place]) 11/02/2019  . Cerebrovascular accident (CVA) due to embolism of right middle cerebral artery ([place]) 10/31/2019  . Chronic venous insufficiency 10/07/2019  . Wound of RLE  08/18/2019  . Hyperlipidemia associated with type 2 diabetes mellitus ([place]) 09/25/2016  . Diastolic dysfunction without heart failure 06/02/2015  . Health care maintenance 03/24/2013  . Hypertension associated with diabetes ([place]) 10/16/2011  . Long term current use of anticoagulant therapy 08/27/2010  . DM (diabetes mellitus), type 2, uncontrolled ([place]) 02/11/2008  . MITRAL REGURGITATION - status post MVR 09/14/1999  . S/P MVR (mitral valve repair) 08/09/1999  . Atrial fibrillation with RVR ([place]) 08/03/1999  . S/P placement of cardiac pacemaker 07/18/1999    Expected Discharge Date: Expected Discharge Date: 12/18/19  Team Members Present: Physician leading conference: Dr. Delice Lesch Care Coodinator  Present: Erlene Quan, BSW;Wenceslao Loper Tobin Chad, RN, BSN, CRRN Nurse Present: Debroah Loop, RN PT Present: Deniece Ree, PT OT Present: Willeen Cass, OT SLP Present: Jettie Booze, CF-SLP PPS Coordinator present : Ileana Ladd, Burna Mortimer, SLP     Current Status/Progress Goal Weekly Team Focus  Bowel/Bladder   Patient is incontinent of urine HS.  Patient will not be incontinemt.  Time toileting   Swallow/Nutrition/ Hydration   DYS 1 (puree) nectar, full supervision//assistance with self feeding.  Supervision  Upgraded trials of solids and thins, independence with self feeding and swallow strategies.   ADL's   max - total A UB self care, total A LB self care bed level, +2 transfers max A to [facility] and for toileting; dense L hemiplegia with trace scapular elevation and biceps; L visual field deficits  min A overall  postural control, L visual scanning, LUE NMR, family education   Mobility   max-totalA of 1-2 people for bed mobility, heavy L lean and L push in sitting and standing (intensity varies depending on fatigue), 2 person Mod-maxA for sit to stand and transfers in stedy  MinA LRAD  basic bed mobility and transfers, maintaining static sitting/standing balance, encouraging/progressing OOB time in TIS [facility]   Communication   Mod A intelligibility strategies  Supervision  increased vocal intensity for speech intellgibility at phrase/sentence level   Safety/Cognition/ Behavioral Observations  Mod A  Min A  sustained attention, functional problem solving   Pain   Patient is pain free.  To remain pain free.  Keeping patient pain free.  Skin   Patient has breakdown on sacral area.  Q2 turns, foam dressings, keep dry  Reduce risk of breakdown.    Rehab Goals Patient on target to meet rehab goals: Yes Rehab Goals Revised: Patient on target with current set goals *See Care Plan and progress notes for long and short-term goals.     Barriers to Discharge  Current  Status/Progress Possible Resolutions Date Resolved   Nursing                  PT  Inaccessible home environment;Medical stability;Home environment access/layout  need to clarify steps/rails, still a very high level of physical assist              OT                  SLP                SW Medical stability SW not aware of barriers On target          Discharge Planning/Teaching Needs:  Patient and spouse plan to discharge home  Will schedule education if recommended   Team Discussion:  Tube feeding stopped 11/26/19; hopeful to remove tube 11/27/19. Blood pressure trending upwards, decline in function to max-total assist, dense hemiplegia on left and left field cut.   Revisions to Treatment Plan:  Will need at least 4 weeks with minimal assist goals set    Medical Summary Current Status: Left-sided hemiparesis and right gaze deviation secondary to large right MCA infarct due to right ICA occlusion status post revascularization/endovascular thrombectomy complicated by worsening edema and mass-effect status post decompressive right hemicraniectomy placement of bone flap into abdominal subcutaneous pocket 11/02/2019. Weekly Focus/Goal: Improve mobility, HTN, PO intake, DM  Barriers to Discharge: Incontinence;Medical stability;Other (comments)  Barriers to Discharge Comments: +NG Possible Resolutions to Barriers: Therapies, optimize HTN/DM meds, advance diet as tolerated - TFs d/ced   Continued Need for Acute Rehabilitation Level of Care: The patient requires daily medical management by a physician with specialized training in physical medicine and rehabilitation for the following reasons: Direction of a multidisciplinary physical rehabilitation program to maximize functional independence : Yes Medical management of patient stability for increased activity during participation in an intensive rehabilitation regime.: Yes Analysis of laboratory values and/or radiology reports with any subsequent  need for medication adjustment and/or medical intervention. : Yes   I attest that I was present, lead the team conference, and concur with the assessment and plan of the team.   Dorien Chihuahua B 11/26/2019, 4:04 PM

## 2019-11-26 NOTE — Progress Notes (Signed)
Speech Language Pathology Daily Session Note  Patient Details  Name: Krystal Little MRN: [id number] Date of Birth: 03/22/1956  Today's Date: 11/26/2019 SLP Individual Time: 9371-6967 SLP Individual Time Calculation (min): 43 min  Short Term Goals: Week 1: SLP Short Term Goal 1 (Week 1): Pt will consume dys 1 textures and nectar thick liquids with min cues for use of swallowing precautions to clear residue from the oral cavity. SLP Short Term Goal 2 (Week 1): Pt will consume therapeutic trials of thin liquids with minimal overt s/s of aspiration and min cues for use of swallowing precautions over 3 consecutive sessions. SLP Short Term Goal 3 (Week 1): Pt will locate items needed for functional tasks at midline in >50% of opportunities with max assist multimodal cues. SLP Short Term Goal 4 (Week 1): Pt will sustain her attention to basic, familiar tasks for 3-5 minute intervals with mod cues needed for redirection. SLP Short Term Goal 5 (Week 1): Pt will complete basic, familiar tasks with mod assist multimodal cues for functional problem solving SLP Short Term Goal 6 (Week 1): Pt will utilize an increased vocal intensity and slow rate of speech to achieve intelligibility at the phrase/short sentence level with min assist verbal cues.  Skilled Therapeutic Interventions: Pt was seen for skilled ST targeting dysphagia and speech goals. She was more alert today than yesterday, and pt's daughter was present throughout session, receptive to all education. Pt preformed oral care with set up assist of suction toothbrush and Min A verbal cues for accurate use of suction function. She then accepted ~4 oz thin H2O vis straw with only 1 immediate cough response. Pt also consumed upgraded dysphagia 2 (minced/ground) solid trials with mildly prolonged mastication observed, but pt with excellent ability to clear trace oral residue with Min A verbal cues. Pt also with improved ability to self feed, with Moderate  verbal cues, less tactile interventions required for spoon placement to midline. Recommend continue current diet and ST will continue to provide opportunities to work toward advancement. Pt with good recall of speech intelligibility strategies (increased vocal intensity and overarticulate), but required Mod A verbal cues to implement them for increased intelligibility in quiet environment throughout session (~80-85% intelligible at sentence level). Pt left laying in bed with alarm set and needs within reach, daughter still present. Continue per current plan of care.        Pain Pain Assessment Pain Scale: 0-10 Pain Score: 0-No pain  Therapy/Group: Individual Therapy  [name] 11/26/2019, 6:54 AM

## 2019-11-27 ENCOUNTER — Inpatient Hospital Stay (HOSPITAL_COMMUNITY): Payer: Medicare Other | Admitting: Occupational Therapy

## 2019-11-27 ENCOUNTER — Inpatient Hospital Stay (HOSPITAL_COMMUNITY): Payer: Medicare Other

## 2019-11-27 ENCOUNTER — Inpatient Hospital Stay (HOSPITAL_COMMUNITY): Payer: Medicare Other | Admitting: Physical Therapy

## 2019-11-27 DIAGNOSIS — I10 Essential (primary) hypertension: Secondary | ICD-10-CM

## 2019-11-27 LAB — GLUCOSE, CAPILLARY
Glucose-Capillary: 126 mg/dL — ABNORMAL HIGH (ref 70–99)
Glucose-Capillary: 113 mg/dL — ABNORMAL HIGH (ref 70–99)
Glucose-Capillary: 189 mg/dL — ABNORMAL HIGH (ref 70–99)
Glucose-Capillary: 168 mg/dL — ABNORMAL HIGH (ref 70–99)

## 2019-11-27 NOTE — Plan of Care (Signed)
  Problem: RH BLADDER ELIMINATION Goal: RH STG MANAGE BLADDER WITH ASSISTANCE Description: STG Manage Bladder With min Assistance 11/27/2019 1322 by Ander Slade, RN Outcome: Not Progressing; incontinence Flowsheets (Taken 11/27/2019 1322) STG: Pt will manage bladder with assistance: 2-Maximum assistance

## 2019-11-27 NOTE — Progress Notes (Signed)
Physical Therapy Session Note  Patient Details  Name: Krystal Little MRN: [id number] Date of Birth: 11/11/1955  Today's Date: 11/27/2019 PT Individual Time: 1550-1615 PT Individual Time Calculation (min): 25 min   Short Term Goals: Week 1:  PT Short Term Goal 1 (Week 1): Patient to be able to maintain midline sitting with no more than MinA and Min cues PT Short Term Goal 2 (Week 1): Patient to be able to tolerate standing in standing frame or stedy for at least 5 minutes PT Short Term Goal 3 (Week 1): Patient to require only Min cues to correct L lateral lean in sitting or standing PT Short Term Goal 4 (Week 1): Patient to be able to transfer with MinAx2 in stedy   Skilled Therapeutic Interventions/Progress Updates:   Pt received sitting in [facility] and agreeable to PT. Sit<>stand transfer training and midline orientation training in steady with visual feedback from mirror. Pt performed sit<>stand in steady x 4 with max assist of 1. Sustained standing balance 3 x 1 min with max assist+2 for safety to block LLE and improve weight shift R. Reciprocal weight shift R and L with Max assist and max cues attention to mirror to initiate R lateral lean. Pt engaged in active rest break to sustain semi-stand in steady with visual feedback from mirror between bouts of standing. Mod assist overall to sustain midline in semistand. Patient returned to room and left sitting in [facility] with call bell in reach and all needs met.         Therapy Documentation Precautions:  Precautions Precautions: Fall Precaution Comments: R hemicraniectomy- skull in R abdomen, L shoulder subluxation, L lateral lean and inattention, needs helmet for OOB, L weakness Restrictions Weight Bearing Restrictions: No    Vital Signs: Therapy Vitals Temp: 99 F (37.2 C) Pulse Rate: 63 Resp: 16 BP: (!) 157/63 Patient Position (if appropriate): Sitting Oxygen Therapy SpO2: 97 % O2 Device: Room Air Pain: Pain Assessment Pain  Score: 0-No pain    Therapy/Group: Individual Therapy  Lorie Phenix 11/27/2019, 4:13 PM

## 2019-11-27 NOTE — Progress Notes (Addendum)
Physical Therapy Session Note  Patient Details  Name: Krystal Little MRN: [id number] Date of Birth: 1955/09/20  Today's Date: 11/27/2019 PT Individual Time: 5035-4656 PT Individual Time Calculation: 54 min  Short Term Goals: Week 1:  PT Short Term Goal 1 (Week 1): Patient to be able to maintain midline sitting with no more than MinA and Min cues PT Short Term Goal 2 (Week 1): Patient to be able to tolerate standing in standing frame or stedy for at least 5 minutes PT Short Term Goal 3 (Week 1): Patient to require only Min cues to correct L lateral lean in sitting or standing PT Short Term Goal 4 (Week 1): Patient to be able to transfer with MinAx2 in stedy  Skilled Therapeutic Interventions/Progress Updates:     Patient in bed with her daughter at bedside upon PT arrival. Patient alert and agreeable to PT session. Patient denied pain during session, requested to go to the bathroom at beginning of session.   Therapeutic Activity: Bed Mobility: Patient performed supine to sit with mod A with HOB elevated and sit to supine with max A+2 for trunk and LE managment. Provided verbal cues for attention and management for L UE throughout and use of R UE to push up to sitting. Patient sat EOB with mod-max A with significant L lean, able to initiate trunk shift toward midline, but unable to find midline without additional assist. Patient performed rolling in bed following toileting, see below, for additional peri-care. Rolling R with mod A and rolling L with max-total A of 1-2 people. Peri-care and LB dressing performed with total A. Educated about donned pants on L LE first. Transfers: Patient performed bed<>toilet transfers using the [facility] with mod A +2 with heavy L lean, improved with hand-over-hand assist to place patient's R hand to contralateral side of the bar to reduce pushing. Provided verbal cues for hand placement, foot placement, midline orientation with manual facilitation, and hip extension  in standing with manual facilitation. Patient required mod-max A for trunk control and min A for head/neck control while seated on the toilet. Patient was continent/incontinent of bowl, started in brief prior to sitting on the toilet. Required total A for peri-care and doffing/donning brief.   Patient in bed with her daughter in the room with her R UE elevated and wrist brace donned at end of session with breaks locked, bed alarm set, and all needs within reach.    Therapy Documentation Precautions:  Precautions Precautions: Fall Precaution Comments: R hemicraniectomy- skull in R abdomen, L shoulder subluxation, L lateral lean and inattention, needs helmet for OOB, L weakness Restrictions Weight Bearing Restrictions: No    Therapy/Group: Individual Therapy  Alto Gandolfo L Jibran Crookshanks PT, DPT  11/27/2019, 3:45 PM

## 2019-11-27 NOTE — Progress Notes (Signed)
[place] PHYSICAL MEDICINE & REHABILITATION PROGRESS NOTE  Subjective/Complaints: Patient seen laying in bed this morning.  She states she slept well overnight.  Discussed deseeding NG tube today, patient placed.  ROS: Denies CP, SOB, N/V/D  Objective: Vital Signs: Blood pressure 140/80, pulse 81, temperature 99 F (37.2 C), temperature source Oral, resp. rate 19, height 5\' 4"  (1.626 m), weight 62.8 kg, SpO2 96 %. No results found. No results for input(s): WBC, HGB, HCT, PLT in the last 72 hours. No results for input(s): NA, K, CL, CO2, GLUCOSE, BUN, CREATININE, CALCIUM in the last 72 hours.  Physical Exam: BP 140/80 (BP Location: Right Arm)   Pulse 81   Temp 99 F (37.2 C) (Oral)   Resp 19   Ht 5\' 4"  (1.626 m)   Wt 62.8 kg   SpO2 96%   BMI 23.76 kg/m   Constitutional: No distress . Vital signs reviewed.  HENT: Right craniectomy with staples C/D/I + NG Eyes: EOMI. No discharge.  Right gaze preference. Cardiovascular: No JVD. Respiratory: Normal effort.  No stridor. GI: Non-distended. Bone flap in right lower quadrant with staples C/D/I Skin: See above Psych: Flat.  Slowed. Musc: No edema in extremities.  No tenderness in extremities. Neurological: Alert Motor: RUE: 4/5 proximal distal RLE: 4/5 proximal distal LUE: 0/5 proximal distal, unchanged LLE: Hip flexion, knee extension 2/5, wiggles toes with apraxia, unchanged Left facial weakness Dysarthria, stable  Assessment/Plan: 1. 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  Physiatrist is providing close team supervision and 24 hour management of active medical problems listed below.  Physiatrist and rehab team continue to assess barriers to discharge/monitor patient  progress toward functional and medical goals  Care Tool:  Bathing    Body parts bathed by patient: Chest, Face   Body parts bathed by helper: Right arm, Left arm, Front perineal area, Buttocks, Right upper leg, Left upper leg, Right lower leg, Left lower leg, Abdomen     Bathing assist Assist Level: 2 Helpers     Upper Body Dressing/Undressing Upper body dressing   What is the patient wearing?: Pull over shirt    Upper body assist Assist Level: Total Assistance - Patient < 25%    Lower Body Dressing/Undressing Lower body dressing      What is the patient wearing?: Incontinence brief     Lower body assist Assist for lower body dressing: 2 Helpers     Toileting Toileting    Toileting assist Assist for toileting: 2 Helpers     Transfers Chair/bed transfer  Transfers assist     Chair/bed transfer assist level: Dependent - mechanical lift     Locomotion Ambulation   Ambulation assist   Ambulation activity did not occur: Safety/medical concerns          Walk 10 feet activity   Assist  Walk 10 feet activity did not occur: Safety/medical concerns        Walk 50 feet activity   Assist Walk 50 feet with 2 turns activity did not occur: Safety/medical concerns         Walk 150 feet activity   Assist Walk 150 feet activity did not occur: Safety/medical concerns(lethargy)         Walk 10 feet on uneven surface  activity   Assist Walk 10 feet on uneven surfaces activity  did not occur: Safety/medical concerns(lethargy)         Wheelchair     Assist Will patient use wheelchair at discharge?: Yes Type of Wheelchair: (TBD)    Wheelchair assist level: Dependent - Patient 0%      Wheelchair 50 feet with 2 turns activity    Assist        Assist Level: Dependent - Patient 0%   Wheelchair 150 feet activity     Assist     Assist Level: Dependent - Patient 0%    Medical Problem List and Plan: 1. Left-sided  hemiparesis and right gaze deviation secondary to large right MCA infarct due to right ICA occlusion status post revascularization/endovascular thrombectomy complicated by worsening edema and mass-effect status post decompressive right hemicraniectomy placement of bone flap into abdominal subcutaneous pocket 11/02/2019.    Repeat head CT personally reviewed, and reviewed with patient, showing some improvement  Helmet for safety when OOB.  Continue CIR  WHO/PRAFO  Communicated with therapies, shoulder sling ordered. 2.  Antithrombotics:  -DVT/anticoagulation:    Discussed with neurology, started Eliquis             -antiplatelet therapy: Aspirin 325 mg daily initiated 11/17/2019 3. Pain Management: Tylenol as needed 4. Mood: Amantadine 100 mg twice daily             -antipsychotic agents: N/A 5. Neuropsych: This patient is not fully capable of making decisions on her own behalf. 6. Skin/Wound Care: Routine skin checks 7. Fluids/Electrolytes/Nutrition: Routine in and outs.    BMP within acceptable range, except for glucose on 5/10 8. Atrial fibrillation/pacemaker.    Failed Coumadin, started Eliquis  Cardiac rate controlled.    Continue Cardizem 60 mg every 6 hours as well as Lopressor 25 mg twice daily.    Follow-up cardiology services as needed 9. Acute blood loss anemia.    Hemoglobin 11.4 on 5/7  Continue to monitor 10. Uncontrolled diabetes mellitus type 2 with hyperglycemia.  Hemoglobin A1c 9.7.    NovoLog 8 units with meals  Lantus insulin 21 units twice daily.    Check blood sugars before meals and at bedtime.  Labile on 5/13, confounded by tube feeds 11. Post stroke dysphagia.  Dysphagia #1 nectar liquids.  Follow-up speech therapy  P.o. intake overall improving  NG DC'd on 5/13 12. Hyperlipidemia.  Lipitor 13. Essential HTN  Controlled on 5/13  See #8   LOS: 7 days A FACE TO FACE EVALUATION WAS PERFORMED  Ankit Lorie Phenix 11/27/2019, 10:00 AM

## 2019-11-27 NOTE — Progress Notes (Signed)
Speech Language Pathology Daily Session Note  Patient Details  Name: Krystal Little MRN: [id number] Date of Birth: Nov 20, 1955  Today's Date: 11/27/2019 SLP Individual Time: 1110-1210 SLP Individual Time Calculation (min): 60 min  Short Term Goals: Week 1: SLP Short Term Goal 1 (Week 1): Pt will consume dys 1 textures and nectar thick liquids with min cues for use of swallowing precautions to clear residue from the oral cavity. SLP Short Term Goal 2 (Week 1): Pt will consume therapeutic trials of thin liquids with minimal overt s/s of aspiration and min cues for use of swallowing precautions over 3 consecutive sessions. SLP Short Term Goal 3 (Week 1): Pt will locate items needed for functional tasks at midline in >50% of opportunities with max assist multimodal cues. SLP Short Term Goal 4 (Week 1): Pt will sustain her attention to basic, familiar tasks for 3-5 minute intervals with mod cues needed for redirection. SLP Short Term Goal 5 (Week 1): Pt will complete basic, familiar tasks with mod assist multimodal cues for functional problem solving SLP Short Term Goal 6 (Week 1): Pt will utilize an increased vocal intensity and slow rate of speech to achieve intelligibility at the phrase/short sentence level with min assist verbal cues.  Skilled Therapeutic Interventions: Skilled ST services focused on swallow and cognitive skills. Pt's daughter and husband present for treatment session. SLP facilitated thin trials via straw following oral care. Pt consumed 8 oz of thin with no overt s/s aspiration noted. Pt did require increased cueing as the trials continued likely due to reduced attention/fatigue, min A increasing to mod A verbal/visual for attention, min A verbal cues to initiate swallow (oral holding likely due to reduced attention) and min A increasing to mod A verbal/visual cues for neutral head position.  SLP also facilitated sustained attention, basic problem solving and visual scanning during  card sorting task, pt sorted cards (with various shapes and numbers of shapes present on cards) by a field of 2, 4 and then 6 with supervision A verbal cues for problem solving.  Pt demonstrated sustained attention during sorting task up to 10 minutes with min A verbal cues for redirection, however after this time required mod A increasing to Max A verbal cues for redirection and to scan left. Pt demonstrated ability to locate cards at midline Mod I during task.  Pt was left in room with family, call bell within reach and bed alarm set. ST recommends to continue skilled ST services.      Pain Pain Assessment Pain Score: 0-No pain  Therapy/Group: Individual Therapy  Mattia Osterman  [facility] 11/27/2019, 4:11 PM

## 2019-11-27 NOTE — Progress Notes (Signed)
Nutrition Follow-up  RD working remotely.  DOCUMENTATION CODES:   Not applicable  INTERVENTION:   - Vital Cuisine Shake TID, each supplement provides 520 kcal and 22 grams of protein  - Pro-stat 30 ml po BID, each supplement provides 100 kcal and 15 grams of protein  - Continue feeding assistance with meals  - Continue MVI with minerals daily  - Magic Cup TID with meals, each supplement provides 290 kcal and 9 grams of protein  - Encourage adequate PO intake  NUTRITION DIAGNOSIS:   Inadequate oral intake related to dysphagia as evidenced by meal completion < 50%, per patient/family report.  Progressing  GOAL:   Patient will meet greater than or equal to 90% of their needs  Progressing  MONITOR:   PO intake, Supplement acceptance, Diet advancement, Labs, Weight trends, TF tolerance, Skin  REASON FOR ASSESSMENT:   Consult Enteral/tube feeding initiation and management  ASSESSMENT:   64 year old female with PMH of HTN, DM, chronic atrial fibrillation. Presented 10/31/19 with left-sided weakness, neglect, and right gaze deviation. Cranial CT scan as well as CT angiogram head and neck showed a large right MCA territory nonhemorrhagic infarction. Hyperdense right MCA suggesting extensive thrombus from the right ICA terminus to the MCA bifurcation. Pt underwent endovascular thrombectomy revascularization per IR. Follow-up imaging showed worsening edema involving the right middle cerebral artery causing significant radiologic mass-effect and pt underwent decompressive right hemicraniectomy placement of bone flap into abdominal subcutaneous pocket on 11/02/19. Currently maintained on a dysphagia 1 diet with nectar-thick liquids and nasogastric tube feeds for nutritional support. Admitted to CIR on 5/06.  5/09 - nocturnal TF rate decreased to stimulate appetite 5/12 - TF d/c 5/13 - Cortrak d/c  Pt with improved PO intake so TF and Cortrak have been d/c. RD will continue  with current oral nutrition supplements to aid pt in meeting kcal and protein needs.  Current weight: 62.8 kg Admit weight: 66.7 kg  Pt with a 3.9 kg weight loss since 11/20/19. Question accuracy of weights given significant drop in such a short period of time. Will continue to monitor trends.  Per RN edema assessment, pt with mild pitting generalized edema and non-pitting edema to BUE.  Meal Completion: 15-75% x last 8 meals  Medications reviewed and include: Pro-stat BID, Novolog 8 units BID with breakfast and lunch, Lantus 21 units BID, protonix, miralax, senna  Labs reviewed. CBG's: 113-304 x 24 hours  Diet Order:   Diet Order            DIET - DYS 1 Room service appropriate? Yes; Fluid consistency: Nectar Thick  Diet effective now              EDUCATION NEEDS:   No education needs have been identified at this time  Skin:  Skin Assessment: Skin Integrity Issues: Incisions: incision to groin, head, abdomen Other: pretibial venous stasis ulcer  Last BM:  11/26/19 large type 6  Height:   Ht Readings from Last 1 Encounters:  11/20/19 5\' 4"  (1.626 m)    Weight:   Wt Readings from Last 1 Encounters:  11/27/19 62.8 kg    Ideal Body Weight:  54.5 kg  BMI:  Body mass index is 23.76 kg/m.  Estimated Nutritional Needs:   Kcal:  1550-1750  Protein:  75-90 grams  Fluid:  >/= 1.6 L    Gaynell Face, MS, RD, LDN Inpatient Clinical Dietitian Pager: [phone] Weekend/After Hours: [phone]

## 2019-11-27 NOTE — Progress Notes (Signed)
Occupational Therapy Session Note  Patient Details  Name: Krystal Little MRN: [id number] Date of Birth: 06-Aug-1955  Today's Date: 11/27/2019 OT Individual Time: 1300-1345 OT Individual Time Calculation (min): 45 min    Short Term Goals: Week 1:  OT Short Term Goal 1 (Week 1): Pt will be able to actively turn her head to the left with min Cues. OT Short Term Goal 2 (Week 1): Pt will be able to wash her L arm with mod A. OT Short Term Goal 3 (Week 1): Pt will be able to sit to EOB with mod A of 1. OT Short Term Goal 4 (Week 1): Pt will be able to rise to stand in stedy lift with max A of 1. OT Short Term Goal 5 (Week 1): Pt will be able to self feed with min A.  Skilled Therapeutic Interventions/Progress Updates:  Patient met lying supine in bed in agreement with OT treatment session with focus on head/neck/postural control, functional transfers, and self-care re-educaiton as detailed below. Patient's daughter and husband present at bedside. Patient indicating need for BM. Supine to EOB with Mod-Max A. Sit to stand in EOB>Stedy>TIS wc with Mod A+2. Maximal multimodal cueing for orientation to midline and upright position throughout treatment session. Toilet transfer to [facility] positioned near foot of bed with patient required Max for sit to stand from perched position and stand to sit. Patient continent of bowel and bladder requiring +2 helpers for hygiene/clothing management. OT provided family education on sitting to patients left to encourage attention to L visual field. Session concluded with patient seated in TIS wc with call bell within reach, belt alarm activated, and all needs met.   Therapy Documentation Precautions:  Precautions Precautions: Fall Precaution Comments: R hemicraniectomy- skull in R abdomen, L shoulder subluxation, L lateral lean and inattention, needs helmet for OOB, L weakness Restrictions Weight Bearing Restrictions: No  Therapy/Group: Individual Therapy  Kit Brubacher R  [place] 11/27/2019, 12:59 PM

## 2019-11-28 ENCOUNTER — Inpatient Hospital Stay (HOSPITAL_COMMUNITY): Payer: Medicare Other | Admitting: Occupational Therapy

## 2019-11-28 ENCOUNTER — Inpatient Hospital Stay (HOSPITAL_COMMUNITY): Payer: Medicare Other | Admitting: Physical Therapy

## 2019-11-28 ENCOUNTER — Inpatient Hospital Stay (HOSPITAL_COMMUNITY): Payer: Medicare Other | Admitting: Speech Pathology

## 2019-11-28 LAB — GLUCOSE, CAPILLARY
Glucose-Capillary: 92 mg/dL (ref 70–99)
Glucose-Capillary: 212 mg/dL — ABNORMAL HIGH (ref 70–99)
Glucose-Capillary: 207 mg/dL — ABNORMAL HIGH (ref 70–99)
Glucose-Capillary: 253 mg/dL — ABNORMAL HIGH (ref 70–99)

## 2019-11-28 MED ORDER — INSULIN GLARGINE 100 UNIT/ML ~~LOC~~ SOLN
16.0000 [IU] | Freq: Two times a day (BID) | SUBCUTANEOUS | Status: DC
  Administered 2019-11-28 – 2019-11-29 (×2): 16 [IU] via SUBCUTANEOUS
  Filled 2019-11-28 (×4): qty 0.16

## 2019-11-28 NOTE — Progress Notes (Signed)
[place] PHYSICAL MEDICINE & REHABILITATION PROGRESS NOTE  Subjective/Complaints: Patient seen laying in bed this morning.  She states he slept well overnight.  She still sleepy this morning.  [name].  Patient states he feels better without the NG tube in place.  ROS: Denies CP, SOB, N/V/D  Objective: Vital Signs: Blood pressure (!) 141/78, pulse 67, temperature 98.3 F (36.8 C), temperature source Oral, resp. rate 19, height 5\' 4"  (1.626 m), weight 62.8 kg, SpO2 96 %. No results found. No results for input(s): WBC, HGB, HCT, PLT in the last 72 hours. No results for input(s): NA, K, CL, CO2, GLUCOSE, BUN, CREATININE, CALCIUM in the last 72 hours.  Physical Exam: BP (!) 141/78 (BP Location: Right Arm)   Pulse 67   Temp 98.3 F (36.8 C) (Oral)   Resp 19   Ht 5\' 4"  (1.626 m)   Wt 62.8 kg   SpO2 96%   BMI 23.76 kg/m   Constitutional: No distress . Vital signs reviewed. HENT: Normocephalic.  Atraumatic.  Right craniectomy with staples C/D/I Eyes: EOMI. No discharge.  Right gaze preference. Cardiovascular: No JVD. Respiratory: Normal effort.  No stridor. GI: Non-distended. Bone flap right lower quadrant with staples C/D/I Skin: Warm and dry.  Intact. Psych: Flat. Musc: No edema in extremities.  No tenderness in extremities. Neuro: Somnolent Motor: RUE: 4/5 proximal distal RLE: 4/5 proximal distal LUE: 0/5 proximal distal, stable LLE: Hip flexion, knee extension 2/5, wiggles toes with apraxia, stable Left facial weakness Dysarthria, stable  Assessment/Plan: 1. 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  Physiatrist is providing close team supervision and 24 hour management of active medical problems listed  below.  Physiatrist and rehab team continue to assess barriers to discharge/monitor patient progress toward functional and medical goals  Care Tool:  Bathing    Body parts bathed by patient: Chest, Face   Body parts bathed by helper: Right arm, Left arm, Front perineal area, Buttocks, Right upper leg, Left upper leg, Right lower leg, Left lower leg, Abdomen     Bathing assist Assist Level: 2 Helpers     Upper Body Dressing/Undressing Upper body dressing   What is the patient wearing?: Pull over shirt    Upper body assist Assist Level: Total Assistance - Patient < 25%    Lower Body Dressing/Undressing Lower body dressing      What is the patient wearing?: Pants, Incontinence brief     Lower body assist Assist for lower body dressing: 2 Helpers     Toileting Toileting    Toileting assist Assist for toileting: 2 Helpers     Transfers Chair/bed transfer  Transfers assist     Chair/bed transfer assist level: Dependent - mechanical lift     Locomotion Ambulation   Ambulation assist   Ambulation activity did not occur: Safety/medical concerns          Walk 10 feet activity   Assist  Walk 10 feet activity did not occur: Safety/medical concerns        Walk 50 feet activity   Assist Walk 50 feet with 2 turns activity did not occur: Safety/medical concerns         Walk 150 feet activity   Assist Walk 150 feet activity did not occur: Safety/medical concerns(lethargy)  Walk 10 feet on uneven surface  activity   Assist Walk 10 feet on uneven surfaces activity did not occur: Safety/medical concerns(lethargy)         Wheelchair     Assist Will patient use wheelchair at discharge?: Yes Type of Wheelchair: (TBD)    Wheelchair assist level: Dependent - Patient 0%      Wheelchair 50 feet with 2 turns activity    Assist        Assist Level: Dependent - Patient 0%   Wheelchair 150 feet activity     Assist      Assist Level: Dependent - Patient 0%    Medical Problem List and Plan: 1. 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    Repeat head CT personally reviewed, and reviewed with patient, showing some improvement  Helmet for safety when OOB.  Continue CIR  WHO/PRAFO  Communicated with therapies, shoulder sling ordered. 2.  Antithrombotics:  -DVT/anticoagulation:    Discussed with neurology, started Eliquis             -antiplatelet therapy: Aspirin 325 mg daily initiated 11/17/2019 3. Pain Management: Tylenol as needed 4. Mood: Amantadine 100 mg twice daily             -antipsychotic agents: N/A 5. Neuropsych: This patient is not fully capable of making decisions on her own behalf. 6. Skin/Wound Care: Routine skin checks 7. Fluids/Electrolytes/Nutrition: Routine in and outs.    BMP within acceptable range, except for glucose on 5/10 8. Atrial fibrillation/pacemaker.    Failed Coumadin, started Eliquis  Cardiac rate controlled.    Continue Cardizem 60 mg every 6 hours as well as Lopressor 25 mg twice daily.    Follow-up cardiology services as needed 9. Acute blood loss anemia.    Hemoglobin 11.4 on 5/7, labs ordered for Monday  Continue to monitor 10. Uncontrolled diabetes mellitus type 2 with hyperglycemia.  Hemoglobin A1c 9.7.    NovoLog 8 units BID with breakfast and lunch  Lantus insulin 21 units twice daily, decreased to 18 twice daily on 5/14 to avoid overcorrection.    Check blood sugars before meals and at bedtime.  Labile, but improving on 5/14 11. Post stroke dysphagia.  Dysphagia #1 nectar liquids.  Follow-up speech therapy  P.o. intake improving  NG DC'd on 5/13 12. Hyperlipidemia.   Lipitor 13. Essential HTN  Slightly elevated on 5/14   See #8   LOS: 8 days A FACE TO FACE EVALUATION WAS PERFORMED  Krystal Little 11/28/2019, 9:51 AM

## 2019-11-28 NOTE — Progress Notes (Signed)
Speech Language Pathology Weekly Progress and Session Note  Patient Details  Name: Krystal Little MRN: [id number] Date of Birth: Aug 20, 1955  Beginning of progress report period:  11/21/2019 End of progress report period:  11/28/2019  Today's Date: 11/28/2019 SLP Individual Time: 1120-1205 SLP Individual Time Calculation (min): 45 min  Short Term Goals: Week 1: SLP Short Term Goal 1 (Week 1): Pt will consume dys 1 textures and nectar thick liquids with min cues for use of swallowing precautions to clear residue from the oral cavity. SLP Short Term Goal 1 - Progress (Week 1): Met SLP Short Term Goal 2 (Week 1): Pt will consume therapeutic trials of thin liquids with minimal overt s/s of aspiration and min cues for use of swallowing precautions over 3 consecutive sessions. SLP Short Term Goal 2 - Progress (Week 1): Met SLP Short Term Goal 3 (Week 1): Pt will locate items needed for functional tasks at midline in >50% of opportunities with max assist multimodal cues. SLP Short Term Goal 3 - Progress (Week 1): Met SLP Short Term Goal 4 (Week 1): Pt will sustain her attention to basic, familiar tasks for 3-5 minute intervals with mod cues needed for redirection. SLP Short Term Goal 4 - Progress (Week 1): Met SLP Short Term Goal 5 (Week 1): Pt will complete basic, familiar tasks with mod assist multimodal cues for functional problem solving SLP Short Term Goal 5 - Progress (Week 1): Progressing toward goal SLP Short Term Goal 6 (Week 1): Pt will utilize an increased vocal intensity and slow rate of speech to achieve intelligibility at the phrase/short sentence level with min assist verbal cues. SLP Short Term Goal 6 - Progress (Week 1): Progressing toward goal    New Short Term Goals: Week 2: SLP Short Term Goal 1 (Week 2): Pt will consume therapeutic trials of thin liquids with minimal overt s/s of aspiration and supervision cues for use of swallowing precautions over 3 consecutive sessions. SLP  Short Term Goal 2 (Week 2): Pt will consume therapeutic trials of dys 2 textures with min cues to clear solids from the oral cavity and minimal overt s/s of aspiration over 3 consecutive sessions prior to advancement. SLP Short Term Goal 3 (Week 2): Pt will locate items needed for functional tasks at midline in >50% of opportunities with mod assist multimodal cues. SLP Short Term Goal 4 (Week 2): Pt will sustain her attention to basic, familiar tasks for 7 minute intervals with mod cues needed for redirection. SLP Short Term Goal 5 (Week 2): Pt will complete basic, familiar tasks with mod assist multimodal cues for functional problem solving SLP Short Term Goal 6 (Week 2): Pt will utilize an increased vocal intensity and slow rate of speech to achieve intelligibility at the phrase/short sentence level with min assist verbal cues.  Weekly Progress Updates: Pt has made slow, functional gains this reporting period and has met 4 out of 6 short term goals.  Pt is currently mod-max assist for tasks due to cognitive deficits.  Pt has demonstrated improved visual scanning to midline, sustained attention to tasks and toleration of trials of thin liquids.  Pt is consuming dys 1, nectar thick liquids diet with min cues for use of swallowing precautions and has been started on the water protocol to continue working towards liquids progression.  Pt and family education is ongoing.  Pt would continue to benefit from skilled ST while inpatient in order to maximize functional independence and reduce burden of care prior to discharge.  Anticipate that pt will need 24/7 supervision at discharge in addition to [place] follow up at next level of care   Intensity: Minumum of 1-2 x/day, 30 to 90 minutes Frequency: 3 to 5 out of 7 days Duration/Length of Stay: 21 days Treatment/Interventions: Cognitive remediation/compensation;Cueing hierarchy;Patient/family education;Environmental controls;Dysphagia/aspiration precaution  training;Internal/external aids;Speech/Language facilitation   Daily Session  Skilled Therapeutic Interventions: Pt was seen for skilled ST targeting goals for dysphagia and cognition.  SLP facilitated the session with trials of thin liquids via straw to continue working towards diet advancement.  Pt was able to complete her own oral care following set up assist with min assist verbal cues for task sequencing.  Pt consumed ~8oz of thin liquids via straw without overt s/s of aspiration and supervision cues for use of universal swallowing precautions.  At this time, pt would benefit from incorporating the water protocol in her treatment plan to maximize hydration, improve pt comfort, and continue working towards liquids progression.  Discussed recommendations with pt and family who were in agreement with plan of care.  SLP also facilitated the session with a basic card game to address sustained attention and visual scanning goals. Pt was able to sustain her attention to tasks for ~3 minute intervals with min cues needed for redirection.  She needed mod assist verbal cues to locate cards at midline.  Pt was left in her wheelchair with chair alarm set and call bell within reach.  Continue per current plan of care.       Pain Pain Assessment Pain Scale: 0-10 Pain Score: 6  Pain Location: Head Pain Descriptors / Indicators: Headache Pain Intervention(s): Other (Comment)(declined interventions at this time)  Therapy/Group: Individual Therapy  Shamekia Tippets, Selinda Orion 11/28/2019, 4:19 PM

## 2019-11-28 NOTE — Progress Notes (Signed)
Occupational Therapy Session Note  Patient Details  Name: Krystal Little MRN: [id number] Date of Birth: 01-Oct-1955  Today's Date: 11/28/2019 OT Individual Time: 1001-1107 OT Individual Time Calculation (min): 66 min    Short Term Goals: Week 1:  OT Short Term Goal 1 (Week 1): Pt will be able to actively turn her head to the left with min Cues. OT Short Term Goal 2 (Week 1): Pt will be able to wash her L arm with mod A. OT Short Term Goal 3 (Week 1): Pt will be able to sit to EOB with mod A of 1. OT Short Term Goal 4 (Week 1): Pt will be able to rise to stand in stedy lift with max A of 1. OT Short Term Goal 5 (Week 1): Pt will be able to self feed with min A.  Skilled Therapeutic Interventions/Progress Updates:    Pt in bed to start session with orientation to place and situation when asked.  She was not able to state physical deficits from her stroke with the exception of stating that her balance was off.  She transitioned to sitting on the EOB with max assist.  She then needed max assist for static and dynamic sitting balance while working on donning her pull up pants.  Total assist following hemi technique for donning her pants sit to stand.  She then completed stand pivot transfer to the wheelchair at total assist as well.  Pt maintains right head turn and right gaze in the chair with cervical flexion as well.  Took pt down to the therapy gym with total assist stand pivot transfer to the therapy mat completed.  She then worked on [employer] and dynamic sitting balance on the mat with mirror used for feedback.  Pt unable to accurately use the mirror for determination of midline orientation as she pushes herself to the left in sitting but when asked, states that she is leaning to the right.  Had her work on maintaining sitting balance while completing functional reach to the bedside table to pick up colored blocks stated by therapist.  Max instructional cueing with occasional min demonstrational  cueing to scan left of midline for locating blocks secondary to visual field cut.  She needed overall mod assist for static sitting balance with max assist for dynamic when reaching for blocks and attempting to return back to midline.  Max demonstrational cueing was needed for maintaining upright posture and anterior pelvic tilt, as she could achieve it but could not maintain longer than 5 seconds.  Finished session with transfer back to the wheelchair at total assist level and return to the room.  Pt left up in the wheelchair with spouse present and safety belt in place.    Therapy Documentation Precautions:  Precautions Precautions: Fall Precaution Comments: R hemicraniectomy- skull in R abdomen, L shoulder subluxation, L lateral lean and inattention, needs helmet for OOB, L weakness Restrictions Weight Bearing Restrictions: No  Pain: Pain Assessment Pain Scale: Faces Pain Score: 0-No pain ADL: See Care Tool Section for some details of mobility and selfcare  Therapy/Group: Individual Therapy  Kristofer Schaffert OTR/L 11/28/2019, 12:46 PM

## 2019-11-28 NOTE — Progress Notes (Signed)
Physical Therapy Session Note  Patient Details  Name: Krystal Little MRN: [id number] Date of Birth: 1956-03-25  Today's Date: 11/28/2019 PT Individual Time: 8295-6213 PT Individual Time Calculation (min): 71 min   Short Term Goals: Week 1:  PT Short Term Goal 1 (Week 1): Patient to be able to maintain midline sitting with no more than MinA and Min cues PT Short Term Goal 2 (Week 1): Patient to be able to tolerate standing in standing frame or stedy for at least 5 minutes PT Short Term Goal 3 (Week 1): Patient to require only Min cues to correct L lateral lean in sitting or standing PT Short Term Goal 4 (Week 1): Patient to be able to transfer with MinAx2 in stedy  Skilled Therapeutic Interventions/Progress Updates: Pt presents sitting in TIS w/c and agreeable to therapy.  Spouse present.  Pt wheeled to gym for time conservation.  Pt transferred to mat table w/ stedy and assist of 2, requiring assist to maintain LUE on stedy and upright posture.  Pt performed seated static balance up to 5' w/ facilitation at left elbow for approximation of shoulder.  Pt maintaining balance w/ right UE on lap, decreased pushing.  Pt performed neck flex/ext and rotation maintaining midline posture, using mirror for visual feedback as well as verbal cues.  Pt reaching forward and down as well as cross midline for cones to challenge balance w/ mod A and facilitation at left elbow.   Pt encouraged to come to upright posture before handing cone to left.  Pt does appear to have some discomfort at abd incision w/ bending.  Pt tolerated chest/shoulder stretch against largeT-ball behind to improve posture.  Pt performed leaning on right FA and pushes up w/ mod A, max A from left elbow.  Pt requires facilitation at left elbow and shoulder to maintain position.  Pt returned to TIS w/ stedy and total assist.  Pt transferred to bed w/ stedy and nursing assist.  Pt rolled multiple times for total assist for clothing and placement of  bedpan.  Pt left in room w/ nursing, bed alarm on.     Therapy Documentation Precautions:  Precautions Precautions: Fall Precaution Comments: R hemicraniectomy- skull in R abdomen, L shoulder subluxation, L lateral lean and inattention, needs helmet for OOB, L weakness Restrictions Weight Bearing Restrictions: No General:   Vital Signs:  Pain:Pt states no pain at initiation of rx, but appears to have some during, w/ abd incision. Pain Assessment Pain Scale: 0-10 Pain Score: 6  Pain Location: Head Pain Descriptors / Indicators: Headache Pain Intervention(s): Other (Comment)(declined interventions at this time) Mobility:      Therapy/Group: Individual Therapy  Ladoris Gene 11/28/2019, 2:16 PM

## 2019-11-29 ENCOUNTER — Inpatient Hospital Stay (HOSPITAL_COMMUNITY): Payer: Medicare Other

## 2019-11-29 LAB — GLUCOSE, CAPILLARY
Glucose-Capillary: 153 mg/dL — ABNORMAL HIGH (ref 70–99)
Glucose-Capillary: 249 mg/dL — ABNORMAL HIGH (ref 70–99)
Glucose-Capillary: 268 mg/dL — ABNORMAL HIGH (ref 70–99)
Glucose-Capillary: 293 mg/dL — ABNORMAL HIGH (ref 70–99)

## 2019-11-29 MED ORDER — SENNOSIDES-DOCUSATE SODIUM 8.6-50 MG PO TABS: 1.0000 | ORAL_TABLET | Freq: Two times a day (BID) | ORAL | Status: DC

## 2019-11-29 MED ORDER — ACETAMINOPHEN 160 MG/5ML PO SOLN
650.0000 mg | ORAL | Status: DC | PRN
  Administered 2019-12-10 – 2019-12-13 (×2): 650 mg via ORAL
  Filled 2019-11-29 (×2): qty 20.3

## 2019-11-29 MED ORDER — PANTOPRAZOLE SODIUM 40 MG PO PACK
40.0000 mg | PACK | Freq: Every day | ORAL | Status: DC
  Administered 2019-11-30 – 2019-12-13 (×13): 40 mg via ORAL
  Filled 2019-11-29 (×13): qty 20

## 2019-11-29 MED ORDER — AMANTADINE HCL 50 MG/5ML PO SYRP
100.0000 mg | ORAL_SOLUTION | Freq: Two times a day (BID) | ORAL | Status: DC
  Administered 2019-11-29 – 2019-12-13 (×29): 100 mg via ORAL
  Filled 2019-11-29 (×34): qty 10

## 2019-11-29 MED ORDER — METOPROLOL TARTRATE 25 MG PO TABS
25.0000 mg | ORAL_TABLET | Freq: Two times a day (BID) | ORAL | Status: DC
  Administered 2019-11-29 – 2019-12-14 (×30): 25 mg via ORAL
  Filled 2019-11-29 (×30): qty 1

## 2019-11-29 MED ORDER — INSULIN GLARGINE 100 UNIT/ML ~~LOC~~ SOLN
17.0000 [IU] | Freq: Two times a day (BID) | SUBCUTANEOUS | Status: DC
  Administered 2019-11-29 – 2019-11-30 (×2): 17 [IU] via SUBCUTANEOUS
  Filled 2019-11-29 (×5): qty 0.17

## 2019-11-29 MED ORDER — ATORVASTATIN CALCIUM 40 MG PO TABS
40.0000 mg | ORAL_TABLET | Freq: Every day | ORAL | Status: DC
  Administered 2019-11-30 – 2019-12-14 (×15): 40 mg via ORAL
  Filled 2019-11-29 (×15): qty 1

## 2019-11-29 MED ORDER — ACETAMINOPHEN 650 MG RE SUPP: 650.0000 mg | RECTAL | Status: DC | PRN

## 2019-11-29 MED ORDER — POLYETHYLENE GLYCOL 3350 17 G PO PACK: 17.0000 g | PACK | Freq: Two times a day (BID) | ORAL | Status: DC

## 2019-11-29 MED ORDER — SENNOSIDES-DOCUSATE SODIUM 8.6-50 MG PO TABS: 1.0000 | ORAL_TABLET | Freq: Every evening | ORAL | Status: DC | PRN

## 2019-11-29 MED ORDER — ACETAMINOPHEN 325 MG PO TABS
650.0000 mg | ORAL_TABLET | ORAL | Status: DC | PRN
  Administered 2019-12-04 – 2019-12-13 (×3): 650 mg via ORAL
  Filled 2019-11-29 (×6): qty 2

## 2019-11-29 MED ORDER — DILTIAZEM 12 MG/ML ORAL SUSPENSION
60.0000 mg | Freq: Four times a day (QID) | ORAL | Status: DC
  Administered 2019-11-29 – 2019-12-14 (×59): 60 mg via ORAL
  Filled 2019-11-29 (×61): qty 6

## 2019-11-29 NOTE — Progress Notes (Signed)
Occupational Therapy Session Note  Patient Details  Name: Krystal Little MRN: [id number] Date of Birth: 21-Feb-1956  Today's Date: 11/29/2019 OT Individual Time: 1000-1100 OT Individual Time Calculation (min): 60 min    Short Term Goals: Week 1:  OT Short Term Goal 1 (Week 1): Pt will be able to actively turn her head to the left with min Cues. OT Short Term Goal 2 (Week 1): Pt will be able to wash her L arm with mod A. OT Short Term Goal 3 (Week 1): Pt will be able to sit to EOB with mod A of 1. OT Short Term Goal 4 (Week 1): Pt will be able to rise to stand in stedy lift with max A of 1. OT Short Term Goal 5 (Week 1): Pt will be able to self feed with min A.  Skilled Therapeutic Interventions/Progress Updates:  Pt received supine in bed with daughter present agreeable to OT intervention. Session focus on functional transfer training and dynamic sitting balance tasks. Overall, pt required MAX A for bed mobility with pt initating advancing RLE to EOB however ultimately needing MAX A to scoot hips to EOB and elevate trunk. Pt required MAX- MOD A for sitting balance EOB and total A to don shorts EOB. MAX A for stand pivot transfer to TIS to pts R side. Pt transported to therapy gym with total A where remainder of session to focus on dynamic sitting balance and functional sit<>stands. Utilized mirror to provide visual feedback as pt with heavy L lateral lean in sitting. Pt seemed to benefit from visual cue of lining up pts R shoulder to therapy techs. Pt instructed to shift weight anteriorly to retrieve block from table and place on L side of mat with RUE. Positioned pts LUE laterally to facilitate NMR when reaching. Pt required MOD- MAX A for sitting balance during task. Pt with tendency to stay in cervical flexion needing MAX cues to keep neck in neutral position. Pt complete sit<>stand from EOM with MAX A +2. Pt required multimodal cues to shift hips anteriorly and elevate trunk in standing while  OTA to block L knee. Pt remained up in TIS at end of session with daughter present, alarm belt activated and all needs within reach.   Therapy Documentation Precautions:  Precautions Precautions: Fall Precaution Comments: R hemicraniectomy- skull in R abdomen, L shoulder subluxation, L lateral lean and inattention, needs helmet for OOB, L weakness Restrictions Weight Bearing Restrictions: No General:   Vital Signs:   Pain: Pt reports no pain during session.   Therapy/Group: Individual Therapy  Ihor Gully 11/29/2019, 12:38 PM

## 2019-11-29 NOTE — Progress Notes (Signed)
[facility] PHYSICAL MEDICINE & REHABILITATION PROGRESS NOTE  Subjective/Complaints: Patient transferring with use of stander. No complaints.   ROS: Denies CP, SOB, N/V/D  Objective: Vital Signs: Blood pressure 125/60, pulse 62, temperature 98.7 F (37.1 C), resp. rate 17, height 5\' 4"  (1.626 m), weight 61.4 kg, SpO2 100 %. No results found. No results for input(s): WBC, HGB, HCT, PLT in the last 72 hours. No results for input(s): NA, K, CL, CO2, GLUCOSE, BUN, CREATININE, CALCIUM in the last 72 hours.  Physical Exam: BP 125/60 (BP Location: Right Arm)   Pulse 62   Temp 98.7 F (37.1 C)   Resp 17   Ht 5\' 4"  (1.626 m)   Wt 61.4 kg   SpO2 100%   BMI 23.23 kg/m   Constitutional: No distress . Vital signs reviewed. Transferring in stander to commode HENT: Normocephalic.  Atraumatic.  Right craniectomy with staples C/D/I Eyes: EOMI. No discharge.  Right gaze preference. Cardiovascular: No JVD. Respiratory: Normal effort.  No stridor. GI: Non-distended. Bone flap right lower quadrant with staples C/D/I Skin: Warm and dry.  Intact. Psych: Flat. Musc: No edema in extremities.  No tenderness in extremities. Neuro: Somnolent Motor: RUE: 4/5 proximal distal RLE: 4/5 proximal distal LUE: 0/5 proximal distal, stable LLE: Hip flexion, knee extension 2/5, wiggles toes with apraxia, stable Left facial weakness Dysarthria, stable  Assessment/Plan: 1. 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  Physiatrist is providing close team supervision and 24 hour management of active medical problems listed below.  Physiatrist and rehab team continue to assess barriers to discharge/monitor patient progress toward functional and medical goals  Care  Tool:  Bathing    Body parts bathed by patient: Chest, Face   Body parts bathed by helper: Right arm, Left arm, Front perineal area, Buttocks, Right upper leg, Left upper leg, Right lower leg, Left lower leg, Abdomen     Bathing assist Assist Level: 2 Helpers     Upper Body Dressing/Undressing Upper body dressing   What is the patient wearing?: Pull over shirt    Upper body assist Assist Level: Total Assistance - Patient < 25%    Lower Body Dressing/Undressing Lower body dressing      What is the patient wearing?: Pants     Lower body assist Assist for lower body dressing: Total Assistance - Patient < 25%     Toileting Toileting    Toileting assist Assist for toileting: 2 Helpers     Transfers Chair/bed transfer  Transfers assist     Chair/bed transfer assist level: Dependent - mechanical lift     Locomotion Ambulation   Ambulation assist   Ambulation activity did not occur: Safety/medical concerns          Walk 10 feet activity   Assist  Walk 10 feet activity did not occur: Safety/medical concerns        Walk 50 feet activity   Assist Walk 50 feet with 2 turns activity did not occur: Safety/medical concerns         Walk 150 feet activity   Assist Walk 150 feet activity did not occur: Safety/medical concerns(lethargy)         Walk 10 feet on uneven surface  activity   Assist Walk 10 feet on uneven surfaces activity did not occur: Safety/medical concerns(lethargy)  Wheelchair     Assist Will patient use wheelchair at discharge?: Yes Type of Wheelchair: (TBD)    Wheelchair assist level: Dependent - Patient 0%      Wheelchair 50 feet with 2 turns activity    Assist        Assist Level: Dependent - Patient 0%   Wheelchair 150 feet activity     Assist     Assist Level: Dependent - Patient 0%    Medical Problem List and Plan: 1. Left-sided hemiparesis and right gaze deviation secondary to  large right MCA infarct due to right ICA occlusion status post revascularization/endovascular thrombectomy complicated by worsening edema and mass-effect status post decompressive right hemicraniectomy placement of bone flap into abdominal subcutaneous pocket 11/02/2019.    Repeat head CT personally reviewed, and reviewed with patient, showing some improvement  Helmet for safety when OOB.  Continue CIR  WHO/PRAFO  Communicated with therapies, shoulder sling ordered. 2.  Antithrombotics:  -DVT/anticoagulation:    Discussed with neurology, started Eliquis             -antiplatelet therapy: Aspirin 325 mg daily initiated 11/17/2019 3. Pain Management: Tylenol as needed. Well controlled 4. Mood: Amantadine 100 mg twice daily             -antipsychotic agents: N/A 5. Neuropsych: This patient is not fully capable of making decisions on her own behalf. 6. Skin/Wound Care: Routine skin checks 7. Fluids/Electrolytes/Nutrition: Routine in and outs.    BMP within acceptable range, except for glucose on 5/10 8. Atrial fibrillation/pacemaker.    Failed Coumadin, started Eliquis  Cardiac rate controlled.    Continue Cardizem 60 mg every 6 hours as well as Lopressor 25 mg twice daily.    Follow-up cardiology services as needed  5/15: HR well controlled 9. Acute blood loss anemia.    Hemoglobin 11.4 on 5/7, labs ordered for Monday  Continue to monitor 10. Uncontrolled diabetes mellitus type 2 with hyperglycemia.  Hemoglobin A1c 9.7.    NovoLog 8 units BID with breakfast and lunch  Lantus insulin 21 units twice daily, decreased to 18 twice daily on 5/14 to avoid overcorrection.    Check blood sugars before meals and at bedtime.  Labile, but improving on 5/14  5/15: elevated: increased Lantus to 17U 11. Post stroke dysphagia.  Dysphagia #1 nectar liquids.  Follow-up speech therapy  P.o. intake improving  NG DC'd on 5/13 12. Hyperlipidemia.  Lipitor 13. Essential HTN  Slightly elevated on 5/14   See  #8  5/15: well controlled   LOS: 9 days A FACE TO FACE EVALUATION WAS PERFORMED  Martha Clan P Sherrol Vicars 11/29/2019, 11:57 AM

## 2019-11-30 ENCOUNTER — Inpatient Hospital Stay (HOSPITAL_COMMUNITY): Payer: Medicare Other | Admitting: Speech Pathology

## 2019-11-30 LAB — GLUCOSE, CAPILLARY
Glucose-Capillary: 155 mg/dL — ABNORMAL HIGH (ref 70–99)
Glucose-Capillary: 178 mg/dL — ABNORMAL HIGH (ref 70–99)
Glucose-Capillary: 163 mg/dL — ABNORMAL HIGH (ref 70–99)
Glucose-Capillary: 265 mg/dL — ABNORMAL HIGH (ref 70–99)

## 2019-11-30 MED ORDER — LISINOPRIL 2.5 MG PO TABS
2.5000 mg | ORAL_TABLET | Freq: Every day | ORAL | Status: DC
  Administered 2019-11-30 – 2019-12-12 (×13): 2.5 mg via ORAL
  Filled 2019-11-30 (×13): qty 1

## 2019-11-30 MED ORDER — INSULIN GLARGINE 100 UNIT/ML ~~LOC~~ SOLN
18.0000 [IU] | Freq: Two times a day (BID) | SUBCUTANEOUS | Status: DC
  Administered 2019-11-30 – 2019-12-02 (×4): 18 [IU] via SUBCUTANEOUS
  Filled 2019-11-30 (×5): qty 0.18

## 2019-11-30 MED ORDER — GERHARDT'S BUTT CREAM
TOPICAL_CREAM | Freq: Every day | CUTANEOUS | Status: DC
  Filled 2019-11-30 (×2): qty 1

## 2019-11-30 MED ORDER — GUAIFENESIN-DM 100-10 MG/5ML PO SYRP
10.0000 mL | ORAL_SOLUTION | Freq: Three times a day (TID) | ORAL | Status: DC | PRN
  Administered 2019-11-30 – 2019-12-13 (×13): 10 mL via ORAL
  Filled 2019-11-30 (×14): qty 10

## 2019-11-30 MED ORDER — CAMPHOR-MENTHOL 0.5-0.5 % EX LOTN
TOPICAL_LOTION | CUTANEOUS | Status: DC | PRN
  Filled 2019-11-30: qty 222

## 2019-11-30 MED ORDER — POLYETHYLENE GLYCOL 3350 17 G PO PACK: 17.0000 g | PACK | Freq: Every day | ORAL | Status: DC | PRN

## 2019-11-30 NOTE — Progress Notes (Signed)
[facility] PHYSICAL MEDICINE & REHABILITATION PROGRESS NOTE  Subjective/Complaints: No complaints. [name]. Nurse reports anal fissure- Gerhardt's but cream ordered Had 5 BM yesterday  ROS: Denies CP, SOB, N/V/D  Objective: Vital Signs: Blood pressure (!) 150/71, pulse 63, temperature 99 F (37.2 C), resp. rate 16, height 5\' 4"  (1.626 m), weight 62 kg, SpO2 98 %. No results found. No results for input(s): WBC, HGB, HCT, PLT in the last 72 hours. No results for input(s): NA, K, CL, CO2, GLUCOSE, BUN, CREATININE, CALCIUM in the last 72 hours.  Physical Exam: BP (!) 150/71 (BP Location: Left Arm)   Pulse 63   Temp 99 F (37.2 C)   Resp 16   Ht 5\' 4"  (1.626 m)   Wt 62 kg   SpO2 98%   BMI 23.46 kg/m   Constitutional: No distress . Vital signs reviewed. Lying in bed comfortably HENT: Normocephalic.  Atraumatic.  Right craniectomy with staples C/D/I Eyes: EOMI. No discharge.  Right gaze preference. Cardiovascular: No JVD. Respiratory: Normal effort.  No stridor. GI: Non-distended. Bone flap right lower quadrant with staples C/D/I Skin: Warm and dry.  Intact. Psych: Flat. Musc: No edema in extremities.  No tenderness in extremities. Neuro: Somnolent Motor: RUE: 4/5 proximal distal RLE: 4/5 proximal distal LUE: 0/5 proximal distal, stable LLE: Hip flexion, knee extension 2/5, wiggles toes with apraxia, stable Left facial weakness Dysarthria, stable  Assessment/Plan: 1. 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  Physiatrist is providing close team supervision and 24 hour management of active medical problems listed below.  Physiatrist and rehab team continue to assess barriers to discharge/monitor patient progress  toward functional and medical goals  Care Tool:  Bathing    Body parts bathed by patient: Chest, Face   Body parts bathed by helper: Right arm, Left arm, Front perineal area, Buttocks, Right upper leg, Left upper leg, Right lower leg, Left lower leg, Abdomen     Bathing assist Assist Level: 2 Helpers     Upper Body Dressing/Undressing Upper body dressing   What is the patient wearing?: Pull over shirt    Upper body assist Assist Level: Total Assistance - Patient < 25%    Lower Body Dressing/Undressing Lower body dressing      What is the patient wearing?: Pants     Lower body assist Assist for lower body dressing: Total Assistance - Patient < 25%     Toileting Toileting    Toileting assist Assist for toileting: 2 Helpers     Transfers Chair/bed transfer  Transfers assist     Chair/bed transfer assist level: Dependent - mechanical lift     Locomotion Ambulation   Ambulation assist   Ambulation activity did not occur: Safety/medical concerns          Walk 10 feet activity   Assist  Walk 10 feet activity did not occur: Safety/medical concerns        Walk 50 feet activity   Assist Walk 50 feet with 2 turns activity did not occur: Safety/medical concerns         Walk 150 feet activity   Assist Walk 150 feet activity did not occur: Safety/medical concerns(lethargy)         Walk 10 feet on uneven surface  activity   Assist Walk 10 feet on uneven surfaces  activity did not occur: Safety/medical concerns(lethargy)         Wheelchair     Assist Will patient use wheelchair at discharge?: Yes Type of Wheelchair: (TBD)    Wheelchair assist level: Dependent - Patient 0%      Wheelchair 50 feet with 2 turns activity    Assist        Assist Level: Dependent - Patient 0%   Wheelchair 150 feet activity     Assist     Assist Level: Dependent - Patient 0%    Medical Problem List and Plan: 1. Left-sided  hemiparesis and right gaze deviation secondary to large right MCA infarct due to right ICA occlusion status post revascularization/endovascular thrombectomy complicated by worsening edema and mass-effect status post decompressive right hemicraniectomy placement of bone flap into abdominal subcutaneous pocket 11/02/2019.    Repeat head CT personally reviewed, and reviewed with patient, showing some improvement  Helmet for safety when OOB.  Continue CIR  WHO/PRAFO  Communicated with therapies, shoulder sling ordered. 2.  Antithrombotics:  -DVT/anticoagulation:    Discussed with neurology, started Eliquis             -antiplatelet therapy: Aspirin 325 mg daily initiated 11/17/2019 3. Pain Management: Tylenol as needed. Well controlled 4. Mood: Amantadine 100 mg twice daily             -antipsychotic agents: N/A 5. Neuropsych: This patient is not fully capable of making decisions on her own behalf. 6. Skin/Wound Care: Routine skin checks  5/16: Gerhardt's butt cream ordered for anal fissure.  7. Fluids/Electrolytes/Nutrition: Routine in and outs.    BMP within acceptable range, except for glucose on 5/10 8. Atrial fibrillation/pacemaker.    Failed Coumadin, started Eliquis  Cardiac rate controlled.    Continue Cardizem 60 mg every 6 hours as well as Lopressor 25 mg twice daily.    Follow-up cardiology services as needed  5/15: HR well controlled 9. Acute blood loss anemia.    Hemoglobin 11.4 on 5/7, labs ordered for Monday  Continue to monitor 10. Uncontrolled diabetes mellitus type 2 with hyperglycemia.  Hemoglobin A1c 9.7.    NovoLog 8 units BID with breakfast and lunch  Lantus insulin 21 units twice daily, decreased to 18 twice daily on 5/14 to avoid overcorrection.    Check blood sugars before meals and at bedtime.  Labile, but improving on 5/14  5/15: elevated: increased Lantus to 17U  5/16: elevated. Increase Lantus to 18U. 11. Post stroke dysphagia.  Dysphagia #1 nectar liquids.   Follow-up speech therapy  P.o. intake improving  NG DC'd on 5/13 12. Hyperlipidemia.  Lipitor 13. Essential HTN  Slightly elevated on 5/14   See #8  5/16 elevated: start Lisinopril 2.5 daily   LOS: 10 days A FACE TO FACE EVALUATION WAS PERFORMED  Clide Deutscher Liese Dizdarevic 11/30/2019, 4:52 PM

## 2019-11-30 NOTE — Progress Notes (Signed)
Speech Language Pathology Daily Session Note  Patient Details  Name: Krystal Little MRN: [id number] Date of Birth: [birth year]  Today's Date: 11/30/2019 SLP Individual Time: 7972-8206 SLP Individual Time Calculation (min): 45 min  Short Term Goals: Week 2: SLP Short Term Goal 1 (Week 2): Pt will consume therapeutic trials of thin liquids with minimal overt s/s of aspiration and supervision cues for use of swallowing precautions over 3 consecutive sessions. SLP Short Term Goal 2 (Week 2): Pt will consume therapeutic trials of dys 2 textures with min cues to clear solids from the oral cavity and minimal overt s/s of aspiration over 3 consecutive sessions prior to advancement. SLP Short Term Goal 3 (Week 2): Pt will locate items needed for functional tasks at midline in >50% of opportunities with mod assist multimodal cues. SLP Short Term Goal 4 (Week 2): Pt will sustain her attention to basic, familiar tasks for 7 minute intervals with mod cues needed for redirection. SLP Short Term Goal 5 (Week 2): Pt will complete basic, familiar tasks with mod assist multimodal cues for functional problem solving SLP Short Term Goal 6 (Week 2): Pt will utilize an increased vocal intensity and slow rate of speech to achieve intelligibility at the phrase/short sentence level with min assist verbal cues.  Skilled Therapeutic Interventions: Patient received skilled SLP services targeting cognitive goals. Patient required mod verbal cues for problem solving during toileting task. Max verbal cues were required to locate items at midline on her breakfast tray with 50% accuracy. Patient participated in a sustained attention task for 10 minutes requiring mod verbal cues for redirection to task. At the end of therapy session patient was upright in bed, bed alarm activated, and all needs within reach.  Therapy/Group: Individual Therapy  Cristy Folks 11/30/2019, 12:26 PM

## 2019-12-01 ENCOUNTER — Inpatient Hospital Stay (HOSPITAL_COMMUNITY): Payer: Medicare Other | Admitting: Occupational Therapy

## 2019-12-01 ENCOUNTER — Inpatient Hospital Stay (HOSPITAL_COMMUNITY): Payer: Medicare Other | Admitting: Physical Therapy

## 2019-12-01 ENCOUNTER — Inpatient Hospital Stay (HOSPITAL_COMMUNITY): Payer: Medicare Other

## 2019-12-01 LAB — GLUCOSE, CAPILLARY
Glucose-Capillary: 150 mg/dL — ABNORMAL HIGH (ref 70–99)
Glucose-Capillary: 249 mg/dL — ABNORMAL HIGH (ref 70–99)
Glucose-Capillary: 215 mg/dL — ABNORMAL HIGH (ref 70–99)
Glucose-Capillary: 283 mg/dL — ABNORMAL HIGH (ref 70–99)

## 2019-12-01 NOTE — Progress Notes (Signed)
Physical Therapy Weekly Progress Note  Patient Details  Name: Krystal Little MRN: [id number] Date of Birth: 05-30-1956  Beginning of progress report period: Nov 21, 2019 End of progress report period: Dec 01, 2019  Today's Date: 12/01/2019 PT Individual Time: 0900-1010 PT Individual Time Calculation (min): 70 min   Patient has met 0 of 4 short term goals, but is making excellent progress towards all of them- she has progressed from being a heavy max-totalAx2 for all mobility and transfers to requiring only MaxAx1 for bed mobility, Min-ModAx2 for functional transfers in stedy, and was able to initiate gait training in the hallway with railing today. Remains very motivated and feel that she continues to have potential to progress well despite slow progress at beginning of care.   Patient continues to demonstrate the following deficits muscle weakness, decreased cardiorespiratoy endurance, impaired timing and sequencing, decreased coordination and decreased motor planning, decreased midline orientation, decreased attention to left and decreased motor planning, decreased initiation, decreased attention, decreased awareness, decreased problem solving, decreased safety awareness and delayed processing and decreased sitting balance, decreased standing balance, decreased postural control, hemiplegia, decreased balance strategies and difficulty maintaining precautions and therefore will continue to benefit from skilled PT intervention to increase functional independence with mobility.  Patient progressing toward long term goals..  Continue plan of care.  PT Short Term Goals Week 1:  PT Short Term Goal 1 (Week 1): Patient to be able to maintain midline sitting with no more than MinA and Min cues PT Short Term Goal 1 - Progress (Week 1): Progressing toward goal PT Short Term Goal 2 (Week 1): Patient to be able to tolerate standing in standing frame or stedy for at least 5 minutes PT Short Term Goal 2 -  Progress (Week 1): Progressing toward goal PT Short Term Goal 3 (Week 1): Patient to require only Min cues to correct L lateral lean in sitting or standing PT Short Term Goal 3 - Progress (Week 1): Progressing toward goal PT Short Term Goal 4 (Week 1): Patient to be able to transfer with MinAx2 in stedy PT Short Term Goal 4 - Progress (Week 1): Progressing toward goal Week 2:  PT Short Term Goal 1 (Week 2): Patient to be able to gait train 60f with LRAD and Mod assist PT Short Term Goal 2 (Week 2): Patient to be able to perform bed  <-> chair transfers with MinAx2, LRAD PT Short Term Goal 3 (Week 2): Patient to be able to maintain midline sitting and standing iwth no more than MinA/Min cues  Skilled Therapeutic Interventions/Progress Updates:    Patient received in bed, pleasant and motivated to participate in session today. Able to complete bed mobility with maxAx1, but continues to require Min-ModA of 1 to maintain midline sitting at EOB; continued to use stedy [facility]Mod[facility] to boost to full upright with cues to correct postural lean and promote full upright standing while in stedy. Introduced gait training today, tolerated 3 bouts of approximately 7-821fwith R railing in hallway and Max assist to maintain balance, for appropriate weight shifting, and for progression of L LE/facilitation of L quad activation however this faded to heavy ModA by the third bout of gait. Otherwise worked on upright standing in midline position and practicing shifting weight L and R with hemiwalker and Min-ModA on both sides for safety. Finished by working on L LE muscle activation from chair level including SAQs, ankle pumps, and seated marches with L LE and Min-ModA.  Noted small wound had opened up on medial R calf- RN alerted. Left up in TIS [facility] with all needs met, seatbelt alarm active and daughter present/RN attending.   Therapy Documentation Precautions:  Precautions Precautions:  Fall Precaution Comments: R hemicraniectomy- skull in R abdomen, L shoulder subluxation, L lateral lean and inattention, needs helmet for OOB, L weakness Restrictions Weight Bearing Restrictions: No     Pain: Pain Assessment Pain Scale: 0-10 Pain Score: 0-No pain    Therapy/Group: Individual Therapy  Windell Norfolk, DPT, PN1   Supplemental Physical Therapist [place]    Pager [phone] Acute Rehab Office [phone]

## 2019-12-01 NOTE — Progress Notes (Signed)
[place] PHYSICAL MEDICINE & REHABILITATION PROGRESS NOTE  Subjective/Complaints: Patient seen laying in bed this morning.  Daughter not at bedside this morning.  She states he slept well overnight.  She states she had a good weekend.  ROS: Denies CP, SOB, N/V/D  Objective: Vital Signs: Blood pressure 138/66, pulse 64, temperature 98 F (36.7 C), resp. rate 17, height 5\' 4"  (1.626 m), weight 62.5 kg, SpO2 97 %. No results found. No results for input(s): WBC, HGB, HCT, PLT in the last 72 hours. No results for input(s): NA, K, CL, CO2, GLUCOSE, BUN, CREATININE, CALCIUM in the last 72 hours.  Physical Exam: BP 138/66   Pulse 64   Temp 98 F (36.7 C)   Resp 17   Ht 5\' 4"  (1.626 m)   Wt 62.5 kg   SpO2 97%   BMI 23.65 kg/m   Constitutional: No distress . Vital signs reviewed. HENT: Normocephalic.  Atraumatic.  Right craniectomy with staples C/D/I Eyes: EOMI. No discharge.  Right gaze preference Cardiovascular: No JVD. Respiratory: Normal effort.  No stridor. GI: Non-distended. Bone cuts right lower quadrant with staples C/D/I Skin: Warm and dry.  Intact. Psych: Flat. Musc: No edema in extremities.  No tenderness in extremities. Neuro: Alert Motor: RUE: 4/5 proximal distal RLE: 4/5 proximal distal LUE: 0/5 proximal distal, unchanged LLE: Hip flexion, knee extension 2/5, wiggles toes with apraxia, unchanged Left facial weakness Dysarthria, stable  Assessment/Plan: 1. 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  Physiatrist is providing close team supervision and 24 hour management of active medical problems listed below.  Physiatrist and rehab team continue to assess barriers to discharge/monitor patient progress toward  functional and medical goals  Care Tool:  Bathing    Body parts bathed by patient: Chest, Face   Body parts bathed by helper: Right arm, Left arm, Front perineal area, Buttocks, Right upper leg, Left upper leg, Right lower leg, Left lower leg, Abdomen     Bathing assist Assist Level: 2 Helpers     Upper Body Dressing/Undressing Upper body dressing   What is the patient wearing?: Pull over shirt    Upper body assist Assist Level: Total Assistance - Patient < 25%    Lower Body Dressing/Undressing Lower body dressing      What is the patient wearing?: Pants     Lower body assist Assist for lower body dressing: Total Assistance - Patient < 25%     Toileting Toileting    Toileting assist Assist for toileting: 2 Helpers     Transfers Chair/bed transfer  Transfers assist     Chair/bed transfer assist level: Dependent - mechanical lift     Locomotion Ambulation   Ambulation assist   Ambulation activity did not occur: Safety/medical concerns          Walk 10 feet activity   Assist  Walk 10 feet activity did not occur: Safety/medical concerns        Walk 50 feet activity   Assist Walk 50 feet with 2 turns activity did not occur: Safety/medical concerns         Walk 150 feet activity   Assist Walk 150 feet activity did not occur: Safety/medical concerns(lethargy)         Walk 10 feet on uneven surface  activity   Assist Walk 10 feet on  uneven surfaces activity did not occur: Safety/medical concerns(lethargy)         Wheelchair     Assist Will patient use wheelchair at discharge?: Yes Type of Wheelchair: (TBD)    Wheelchair assist level: Dependent - Patient 0%      Wheelchair 50 feet with 2 turns activity    Assist        Assist Level: Dependent - Patient 0%   Wheelchair 150 feet activity     Assist     Assist Level: Dependent - Patient 0%    Medical Problem List and Plan: 1. Left-sided hemiparesis  and right gaze deviation secondary to large right MCA infarct due to right ICA occlusion status post revascularization/endovascular thrombectomy complicated by worsening edema and mass-effect status post decompressive right hemicraniectomy placement of bone flap into abdominal subcutaneous pocket 11/02/2019.    Repeat head CT personally reviewed, and reviewed with patient, showing some improvement  Helmet for safety when OOB.  Continue CIR  WHO/PRAFO  Communicated with therapies, shoulder sling ordered. 2.  Antithrombotics:  -DVT/anticoagulation:    Discussed with neurology, started Eliquis             -antiplatelet therapy: Aspirin 325 mg daily initiated 11/17/2019 3. Pain Management: Tylenol as needed. Well controlled 4. Mood: Amantadine 100 mg twice daily             -antipsychotic agents: N/A 5. Neuropsych: This patient is not fully capable of making decisions on her own behalf. 6. Skin/Wound Care: Routine skin checks  5/16: Gerhardt's butt cream ordered for anal fissure.  7. Fluids/Electrolytes/Nutrition: Routine in and outs.    BMP within acceptable range, except for glucose on 5/10 8. Atrial fibrillation/pacemaker.    Failed Coumadin, started Eliquis  Cardiac rate controlled.    Continue Cardizem 60 mg every 6 hours as well as Lopressor 25 mg twice daily.    Follow-up cardiology services as needed 9. Acute blood loss anemia.    Hemoglobin 11.4 on 5/7, labs ordered for tomorrow   Continue to monitor 10. Uncontrolled diabetes mellitus type 2 with hyperglycemia.  Hemoglobin A1c 9.7.    NovoLog 8 units BID with breakfast and lunch  Lantus insulin 21 units twice daily, decreased to 16 twice daily on 5/14, increased to 18 twice daily on 5/16   Check blood sugars before meals and at bedtime.  Labile on 5/17  11. Post stroke dysphagia.  Dysphagia #1 nectar liquids.  Follow-up speech therapy  P.o. intake improving  NG DC'd on 5/13 12. Hyperlipidemia.  Lipitor 13. Essential HTN  See  #8  Started lisinopril 2.5 daily on 5/16  Controlled on 5/17   LOS: 11 days A FACE TO FACE EVALUATION WAS PERFORMED  Kiran Carline Lorie Phenix 12/01/2019, 12:33 PM

## 2019-12-01 NOTE — Progress Notes (Signed)
Speech Language Pathology Daily Session Note  Patient Details  Name: Krystal Little MRN: [id number] Date of Birth: 15-Jul-1956  Today's Date: 12/01/2019 SLP Individual Time: 3614-4315 SLP Individual Time Calculation (min): 26 min  Short Term Goals: Week 2: SLP Short Term Goal 1 (Week 2): Pt will consume therapeutic trials of thin liquids with minimal overt s/s of aspiration and supervision cues for use of swallowing precautions over 3 consecutive sessions. SLP Short Term Goal 2 (Week 2): Pt will consume therapeutic trials of dys 2 textures with min cues to clear solids from the oral cavity and minimal overt s/s of aspiration over 3 consecutive sessions prior to advancement. SLP Short Term Goal 3 (Week 2): Pt will locate items needed for functional tasks at midline in >50% of opportunities with mod assist multimodal cues. SLP Short Term Goal 4 (Week 2): Pt will sustain her attention to basic, familiar tasks for 7 minute intervals with mod cues needed for redirection. SLP Short Term Goal 5 (Week 2): Pt will complete basic, familiar tasks with mod assist multimodal cues for functional problem solving SLP Short Term Goal 6 (Week 2): Pt will utilize an increased vocal intensity and slow rate of speech to achieve intelligibility at the phrase/short sentence level with min assist verbal cues.  Skilled Therapeutic Interventions: Skilled ST services focused on swallow and cognitive skills. OT was finishing toilet transfer and providing education to husband upon entering room. SLP facilitated oral care prior to PO consumption of thin via straw. Pt completed oral care with suction toothbrush requiring min A for efficient cleaning of oral cavity. Pt consumed 6 oz with no overt s/s aspiration and supervision A verbal cues for use of swallow precautions. SLP also facilitated basic problem solving, sustained attention and visual scanning in card sorting task by 6 shapes (with various colors and numbers of shapes  present on cards), pt demonstrated mod I for problem solving, sustained attention in 10 minute intervals with min A verbal cues, min A verbal cues to locate cards at midline and max A visual cues to locate cards left of midline. Pt was left in room with husband, call bell within reach and chair alarm set. ST recommends to continue skilled ST services.      Pain Pain Assessment Pain Scale: 0-10 Pain Score: 0-No pain  Therapy/Group: Individual Therapy  Charna Neeb  [facility] 12/01/2019, 3:40 PM

## 2019-12-01 NOTE — Progress Notes (Signed)
Occupational Therapy Session Note  Patient Details  Name: Krystal Little MRN: [id number] Date of Birth: July 11, 1956  Today's Date: 12/01/2019 OT Individual Time: 8850-2774 OT Individual Time Calculation (min): 77 min    Short Term Goals: Week 1:  OT Short Term Goal 1 (Week 1): Pt will be able to actively turn her head to the left with min Cues. OT Short Term Goal 2 (Week 1): Pt will be able to wash her L arm with mod A. OT Short Term Goal 3 (Week 1): Pt will be able to sit to EOB with mod A of 1. OT Short Term Goal 4 (Week 1): Pt will be able to rise to stand in stedy lift with max A of 1. OT Short Term Goal 5 (Week 1): Pt will be able to self feed with min A.  Skilled Therapeutic Interventions/Progress Updates:    Treatment session with focus on trunk control in sitting and standing, midline orientation, and Lt attention during self-care tasks and therapeutic activities.  Pt received in TIS w/c agreeable to therapy session.  Pt reports need to toilet.  Completed transfer to [employer] with use of Stedy pt requiring max assist +2 for sit > stand from w/c but then only max assist of 1 when standing from [facility].  Pt with no void on [facility] therefore completed clothing management total assist of one while 2nd person present of safety and to assist with Stedy.  Engaged in dynamic sitting balance activity from edge of therapy mat with focus on visual scanning to Lt with weight shift to obtain bean bag and then returning to midline to toss into basketball hoop on mirror for visual feedback.  Therapist positioned to Lt to provide support at LUE to promote WB while also facilitating increased awareness of LUE.  Therapist providing facilitation at trunk to increase lateral weight shift during reaching.  Utilized large therapy ball to facilitate weight shifting to Rt for further Lt trunk elongation.  Utilized music throughout session as well to increase active participation in treatment session.  At end of session, pt  reports need to toilet therefore completed sit > stand in [place] with Max assist with increased Lt lean and pushing due to fatigue.  Transferred to regular toilet in [place] with max assist for stand to sit on toilet.  Pt able to void urine this attempt.  Required total assist +2 for standard commode and total assist for hygiene and clothing management while pt maintained standing in [place] with max assist.  Pt returned to w/c and left upright with SLP arriving for next session.  Therapy Documentation Precautions:  Precautions Precautions: Fall Precaution Comments: R hemicraniectomy- skull in R abdomen, L shoulder subluxation, L lateral lean and inattention, needs helmet for OOB, L weakness Restrictions Weight Bearing Restrictions: No General:   Vital Signs: Therapy Vitals Temp: 98 F (36.7 C) Temp Source: Oral Pulse Rate: 62 BP: (!) 152/70 Patient Position (if appropriate): Sitting Oxygen Therapy SpO2: 98 % O2 Device: Room Air Pain: Pain Assessment Pain Scale: 0-10 Pain Score: 0-No pain   Therapy/Group: Individual Therapy  Simonne Come 12/01/2019, 3:36 PM

## 2019-12-01 NOTE — Plan of Care (Signed)
  Problem: Consults Goal: RH STROKE PATIENT EDUCATION Description: See Patient Education module for education specifics  Outcome: Progressing Goal: Nutrition Consult-if indicated Outcome: Progressing   Problem: RH BLADDER ELIMINATION Goal: RH STG MANAGE BLADDER WITH ASSISTANCE Description: STG Manage Bladder With min Assistance Outcome: Progressing   Problem: RH SKIN INTEGRITY Goal: RH STG SKIN FREE OF INFECTION/BREAKDOWN Description: Pt will be free of skin breakdown/infection with min assist while in CIR Outcome: Progressing   Problem: RH SAFETY Goal: RH STG ADHERE TO SAFETY PRECAUTIONS W/ASSISTANCE/DEVICE Description: STG Adhere to Safety Precautions With supervision Assistance/Device. Outcome: Progressing   Problem: RH KNOWLEDGE DEFICIT Goal: RH STG INCREASE KNOWLEDGE OF DIABETES Description: Pt/family will be able to demonstrate understanding of DM management with mod I assist using handouts/booklets prior to DC Outcome: Progressing Goal: RH STG INCREASE KNOWLEDGE OF HYPERTENSION Description: Pt/family will be able to demonstrate understanding of HTN management with mod I assist using handouts/booklets prior to DC Outcome: Progressing Goal: RH STG INCREASE KNOWLEDGE OF DYSPHAGIA/FLUID INTAKE Description: Pt/family will be able to demonstrate understanding of dysphagia management and aspiration risk with mod I assist using handouts/booklets prior to DC Outcome: Progressing Goal: RH STG INCREASE KNOWLEGDE OF HYPERLIPIDEMIA Description: Pt/family will be able to demonstrate understanding of HLD management with mod I assist using handouts/booklets prior to DC Outcome: Progressing Goal: RH STG INCREASE KNOWLEDGE OF STROKE PROPHYLAXIS Description: Pt/family will be able to demonstrate understanding of stroke prevention with mod I assist using handouts/booklets prior to DC Outcome: Progressing   Problem: RH Vision Goal: RH LTG Vision (Specify) Outcome: Progressing

## 2019-12-02 ENCOUNTER — Inpatient Hospital Stay (HOSPITAL_COMMUNITY): Payer: Medicare Other

## 2019-12-02 ENCOUNTER — Inpatient Hospital Stay (HOSPITAL_COMMUNITY): Payer: Medicare Other | Admitting: Occupational Therapy

## 2019-12-02 ENCOUNTER — Inpatient Hospital Stay (HOSPITAL_COMMUNITY): Payer: Medicare Other | Admitting: Speech Pathology

## 2019-12-02 LAB — CBC WITH DIFFERENTIAL/PLATELET
Abs Immature Granulocytes: 0.03 10*3/uL (ref 0.00–0.07)
Basophils Absolute: 0 10*3/uL (ref 0.0–0.1)
Basophils Relative: 0 %
Eosinophils Absolute: 0.1 10*3/uL (ref 0.0–0.5)
Eosinophils Relative: 2 %
HCT: 35.7 % — ABNORMAL LOW (ref 36.0–46.0)
Hemoglobin: 11.6 g/dL — ABNORMAL LOW (ref 12.0–15.0)
Immature Granulocytes: 1 %
Lymphocytes Relative: 29 %
Lymphs Abs: 1.5 10*3/uL (ref 0.7–4.0)
MCH: 30.8 pg (ref 26.0–34.0)
MCHC: 32.5 g/dL (ref 30.0–36.0)
MCV: 94.7 fL (ref 80.0–100.0)
Monocytes Absolute: 0.5 10*3/uL (ref 0.1–1.0)
Monocytes Relative: 9 %
Neutro Abs: 3.1 10*3/uL (ref 1.7–7.7)
Neutrophils Relative %: 59 %
Platelets: 196 10*3/uL (ref 150–400)
RBC: 3.77 MIL/uL — ABNORMAL LOW (ref 3.87–5.11)
RDW: 15.1 % (ref 11.5–15.5)
WBC: 5.3 10*3/uL (ref 4.0–10.5)
nRBC: 0 % (ref 0.0–0.2)

## 2019-12-02 LAB — GLUCOSE, CAPILLARY
Glucose-Capillary: 104 mg/dL — ABNORMAL HIGH (ref 70–99)
Glucose-Capillary: 193 mg/dL — ABNORMAL HIGH (ref 70–99)
Glucose-Capillary: 189 mg/dL — ABNORMAL HIGH (ref 70–99)
Glucose-Capillary: 160 mg/dL — ABNORMAL HIGH (ref 70–99)

## 2019-12-02 MED ORDER — INSULIN GLARGINE 100 UNIT/ML ~~LOC~~ SOLN
17.0000 [IU] | Freq: Two times a day (BID) | SUBCUTANEOUS | Status: DC
  Administered 2019-12-02 – 2019-12-04 (×4): 17 [IU] via SUBCUTANEOUS
  Filled 2019-12-02 (×5): qty 0.17

## 2019-12-02 NOTE — Progress Notes (Signed)
Speech Language Pathology Daily Session Note  Patient Details  Name: Krystal Little MRN: [id number] Date of Birth: [birth year]  Today's Date: 12/02/2019 SLP Individual Time: 0812-0900 SLP Individual Time Calculation (min): 48 min  Short Term Goals: Week 2: SLP Short Term Goal 1 (Week 2): Pt will consume therapeutic trials of thin liquids with minimal overt s/s of aspiration and supervision cues for use of swallowing precautions over 3 consecutive sessions. SLP Short Term Goal 2 (Week 2): Pt will consume therapeutic trials of dys 2 textures with min cues to clear solids from the oral cavity and minimal overt s/s of aspiration over 3 consecutive sessions prior to advancement. SLP Short Term Goal 3 (Week 2): Pt will locate items needed for functional tasks at midline in >50% of opportunities with mod assist multimodal cues. SLP Short Term Goal 4 (Week 2): Pt will sustain her attention to basic, familiar tasks for 7 minute intervals with mod cues needed for redirection. SLP Short Term Goal 5 (Week 2): Pt will complete basic, familiar tasks with mod assist multimodal cues for functional problem solving SLP Short Term Goal 6 (Week 2): Pt will utilize an increased vocal intensity and slow rate of speech to achieve intelligibility at the phrase/short sentence level with min assist verbal cues.  Skilled Therapeutic Interventions: Skilled treatment session focused on cognitive and dysphagia goals. SLP facilitated session by providing overall Mod A verbal cues for sustained attention to self-feeding and to attend to meal items at midline. Mod-Max A verbal cues were also needed to locate items within her right visual field. Patient consumed breakfast meal of Dys. 1 textures with nectar-thick liquids with an intermittent wet cough which patient reports is baseline. Patient also had moderate right anterior spillage that she cleared with moderate verbal cues. Recommend repeat MBS to assess swallow function and  possible diet upgrade.  Patient performed basic self-care tasks (washing her face and oral care via the suction toothbrush) with Mod verbal and tactile cues for termination of task. Patient left upright in wheelchair with alarm on and all needs within reach. Continue with current plan of care.      Pain No/Denies Pain   Therapy/Group: Individual Therapy  Yohanna Tow 12/02/2019, 9:34 AM

## 2019-12-02 NOTE — Progress Notes (Signed)
Physical Therapy Session Note  Patient Details  Name: Krystal Little MRN: [id number] Date of Birth: 03-Mar-1956  Today's Date: 12/02/2019 PT Individual Time: 4098-1191 and 4782-9562  PT Individual Time Calculation (min): 59 min and 44 min  Short Term Goals: Week 1:  PT Short Term Goal 1 (Week 1): Patient to be able to maintain midline sitting with no more than MinA and Min cues PT Short Term Goal 1 - Progress (Week 1): Progressing toward goal PT Short Term Goal 2 (Week 1): Patient to be able to tolerate standing in standing frame or stedy for at least 5 minutes PT Short Term Goal 2 - Progress (Week 1): Progressing toward goal PT Short Term Goal 3 (Week 1): Patient to require only Min cues to correct L lateral lean in sitting or standing PT Short Term Goal 3 - Progress (Week 1): Progressing toward goal PT Short Term Goal 4 (Week 1): Patient to be able to transfer with MinAx2 in stedy PT Short Term Goal 4 - Progress (Week 1): Progressing toward goal Week 2:  PT Short Term Goal 1 (Week 2): Patient to be able to gait train 37ft with LRAD and Mod assist PT Short Term Goal 2 (Week 2): Patient to be able to perform bed  <-> chair transfers with MinAx2, LRAD PT Short Term Goal 3 (Week 2): Patient to be able to maintain midline sitting and standing iwth no more than MinA/Min cues  Skilled Therapeutic Interventions/Progress Updates:   Treatment Session 1: 1308-6578 59 min Received pt sitting in TIS WC, pt agreeable to therapy, and denied any pain during session. Session focused on functional mobility/transfers, LE strength, dynamic sitting balance/coodination, ambulation, NMR, motor control/planning, L attention, midline orientation, and improved activity tolerance. Donned shoes and L sling total assist for time management purposes. Pt transported to therapy gym in [facility] total assist for time management purposes. Pt transferred sit<>stand mod A and worked on gait training 87ft x1, 66ft x 1, and 46ft x 1 with  shoe cover over L LE mod A +2. Pt performed trial 1 of gait with rail on R and additional 2 trials using 3 musketeer assist and mirror for visual feedback with improved gait pattern. Pt required manual facilitation to advance L LE and maintain quad activation with +2 assist providing cues for upright posture and weight shifting to R. Pt required multiple rest breaks throughout session due to increased fatigue. Pt transferred TIS WC<>mat via slideboard min A +2 with cues for hand placement, head/hips relationship, and scooting. Worked on dynamic sitting/standing balance reaching to R with R UE to grab checkers piece and then transferring to standing to place piece in bucket on R side with mod A +2 with emphasis on R weight shifting, reaching outside BOS, and retuning to midline position. Pt reported urge to use restroom and transferred mat<>TIS WC via slideboard mod A +2 to L. Pt transported back to room in [facility] total assist. Pt transferred stand<>pivot to/from toilet with bedside commode over top with mod A +2. Pt required total assist to doff/don brief and pants. Pt unable to void or have BM. Concluded session with pt semi-reclined in TIS WC, needs within reach, and seatbelt alarm on.   Treatment Session 2: 1445-1529 44 min Received pt sitting in TIS WC, pt agreeable to therapy, and denied any pain during session. Session focused on functional mobility/transfers, LE strength, dynamic sitting/standing balance/coodination, NMR, motor control/planning, L attention, midline orientation, and improved activity tolerance. Donned sling sitting in TIS WC  total assist. Pt transported to therapy gym in [facility] total assist for time management purposes. Pt transferred TIS WC<>mat via slideboard mod A +2. Pt transferred sit<>stand max A +2 x 3 trials using mirror for visual feedback. Pt unable to stand completely upright and extend bilateral knees and pushing strong to L. Pt required manual facilitation to extend L knee and weight  shift to R and verbal cues for upright posture and bilateral hip extension. Returned to sitting position for safety and due to pt fatigue. Worked on dynamic sitting balance and reaching outside BOS to R to grab horseshoes and throw them with R UE x 2 trials with min A/CGA using mirror for visual feedback and cues for midline orientation. Pt transferred mat<>TIS WC via slideboard max A of 1 to R side. Pt transported back to room in TIS WC total assist. Concluded session with pt semi-reclined in TIS WC, needs within reach, and seatbelt alarm on. Therapist provided pillow to support L UE.   Therapy Documentation Precautions:  Precautions Precautions: Fall Precaution Comments: R hemicraniectomy- skull in R abdomen, L shoulder subluxation, L lateral lean and inattention, needs helmet for OOB, L weakness Restrictions Weight Bearing Restrictions: No  Therapy/Group: Individual Therapy Alfonse Alpers PT, DPT   12/02/2019, 7:23 AM

## 2019-12-02 NOTE — Progress Notes (Signed)
[place] PHYSICAL MEDICINE & REHABILITATION PROGRESS NOTE  Subjective/Complaints: Patient seen laying in b patient seen laying in bed this morning.  She states she slept well overnight.  She is still sleepy this morning.  ROS: Denies CP, SOB, N/V/D  Objective: Vital Signs: Blood pressure (!) 141/78, pulse 61, temperature 98 F (36.7 C), resp. rate 18, height 5\' 4"  (1.626 m), weight 63 kg, SpO2 97 %. No results found. Recent Labs    12/02/19 0612  WBC 5.3  HGB 11.6*  HCT 35.7*  PLT 196   No results for input(s): NA, K, CL, CO2, GLUCOSE, BUN, CREATININE, CALCIUM in the last 72 hours.  Physical Exam: BP (!) 141/78   Pulse 61   Temp 98 F (36.7 C)   Resp 18   Ht 5\' 4"  (1.626 m)   Wt 63 kg   SpO2 97%   BMI 23.84 kg/m   Constitutional: No distress . Vital signs reviewed. HENT: Normocephalic.  Atraumatic.  Right craniectomy with staples C/D/I Eyes: EOMI. No discharge.  Right gaze preference Cardiovascular: No JVD. Respiratory: Normal effort.  No stridor. GI: Non-distended. Bone flap right lower quadrant with staples C/D/I Skin: See above. Psych: Flat. Musc: No edema in extremities.  No tenderness in extremities. Neuro: Alert Motor: RUE: 4/5 proximal distal RLE: 4/5 proximal distal LUE: 0/5 proximal distal, stable LLE: Hip flexion, knee extension 2/5, wiggles toes with apraxia, unchanged Left facial weakness Dysarthria, stable  Assessment/Plan: 1. 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  Physiatrist is providing close team supervision and 24 hour management of active medical problems listed below.  Physiatrist and rehab team continue to assess barriers to discharge/monitor patient progress toward functional and medical  goals  Care Tool:  Bathing    Body parts bathed by patient: Chest, Face   Body parts bathed by helper: Right arm, Left arm, Front perineal area, Buttocks, Right upper leg, Left upper leg, Right lower leg, Left lower leg, Abdomen     Bathing assist Assist Level: 2 Helpers     Upper Body Dressing/Undressing Upper body dressing   What is the patient wearing?: Pull over shirt    Upper body assist Assist Level: Total Assistance - Patient < 25%    Lower Body Dressing/Undressing Lower body dressing      What is the patient wearing?: Pants     Lower body assist Assist for lower body dressing: Total Assistance - Patient < 25%     Toileting Toileting    Toileting assist Assist for toileting: 2 Helpers     Transfers Chair/bed transfer  Transfers assist     Chair/bed transfer assist level: Dependent - mechanical lift     Locomotion Ambulation   Ambulation assist   Ambulation activity did not occur: Safety/medical concerns  Assist level: 2 helpers(rail and close WC follow) Assistive device: Other (comment)(rail in hall) Max distance: 12ft   Walk 10 feet activity   Assist  Walk 10 feet activity did not occur: Safety/medical concerns        Walk 50 feet activity   Assist Walk 50 feet with 2 turns activity did not occur: Safety/medical concerns         Walk 150 feet activity   Assist Walk 150 feet activity did not occur: Safety/medical concerns(lethargy)  Walk 10 feet on uneven surface  activity   Assist Walk 10 feet on uneven surfaces activity did not occur: Safety/medical concerns(lethargy)         Wheelchair     Assist Will patient use wheelchair at discharge?: Yes Type of Wheelchair: (TBD)    Wheelchair assist level: Dependent - Patient 0%      Wheelchair 50 feet with 2 turns activity    Assist        Assist Level: Dependent - Patient 0%   Wheelchair 150 feet activity     Assist     Assist Level:  Dependent - Patient 0%    Medical Problem List and Plan: 1. 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    Repeat head CT personally reviewed, and reviewed with patient, showing some improvement  Helmet for safety when OOB.  WHO/PRAFO  Communicated with therapies, shoulder sling ordered.  Continue CIR 2.  Antithrombotics:  -DVT/anticoagulation:    Discussed with neurology, continue Eliquis             -antiplatelet therapy: Aspirin 325 mg daily initiated 11/17/2019 3. Pain Management: Tylenol as needed. Well controlled 4. Mood: Amantadine 100 mg twice daily             -antipsychotic agents: N/A 5. Neuropsych: This patient is not fully capable of making decisions on her own behalf. 6. Skin/Wound Care: Routine skin checks  5/16: Gerhardt's butt cream ordered for anal fissure.  7. Fluids/Electrolytes/Nutrition: Routine in and outs.    BMP within acceptable range, except for glucose on 5/10 8. Atrial fibrillation/pacemaker.    Failed Coumadin, started Eliquis  Cardiac rate controlled.    Continue Cardizem 60 mg every 6 hours as well as Lopressor 25 mg twice daily.    Follow-up cardiology services as needed 9. Acute blood loss anemia.    Hemoglobin 11.6 on 5/18   Continue to monitor 10. Uncontrolled diabetes mellitus type 2 with hyperglycemia.  Hemoglobin A1c 9.7.    NovoLog 8 units BID with breakfast and lunch  Lantus insulin 21 units twice daily, decreased to 16 twice daily on 5/14, increased to 18 twice daily on 5/16, decreased to 17 twice daily on 5/18  Check blood sugars before meals and at bedtime.  Labile on 5/18, will consider increasing NovoLog if persistent 11. Post stroke dysphagia.  Dysphagia  #1 nectar liquids.  Follow-up speech therapy  P.o. intake improving  NG DC'd on 5/13 12. Hyperlipidemia.  Lipitor 13. Essential HTN  See #8  Started lisinopril 2.5 daily on 5/16  Slightly elevated on 5/18, will consider further adjustments if persistent   LOS: 12 days A FACE TO [place] 12/02/2019, 9:00 AM

## 2019-12-02 NOTE — Progress Notes (Signed)
Occupational Therapy Weekly Progress Note  Patient Details  Name: Krystal Little MRN: [id number] Date of Birth: 15-Feb-1956  Beginning of progress report period: Nov 21, 2019 End of progress report period: Dec 02, 2019  Today's Date: 12/02/2019 OT Individual Time: 6720-9470 OT Individual Time Calculation (min): 60 min    Patient has met 3 of 5 short term goals.  She is progressing with her visual scanning skills, attention to L arm during self care, and self feeding to meet her STG.  She is progressing but has not reached STG with sitting balance or sit to stands.  She has very poor strength in LLE and significant hemiplegia in LUE with trace movements noted in shoulder.  She continues to have difficulty with midline awareness, postural control, but is now keeping her head up and in midline more.   Patient continues to demonstrate the following deficits: muscle weakness, decreased cardiorespiratoy endurance, abnormal tone, unbalanced muscle activation and motor apraxia, decreased visual acuity, decreased visual perceptual skills, decreased visual motor skills and hemianopsia, decreased midline orientation and decreased attention to left, decreased attention, decreased awareness, decreased problem solving and delayed processing and decreased sitting balance, decreased standing balance, decreased postural control, hemiplegia and decreased balance strategies and therefore will continue to benefit from skilled OT intervention to enhance overall performance with BADL and Reduce care partner burden.  Patient progressing toward long term goals..  Continue plan of care.  OT Short Term Goals Week 1:  OT Short Term Goal 1 (Week 1): Pt will be able to actively turn her head to the left with min Cues. OT Short Term Goal 1 - Progress (Week 1): Met OT Short Term Goal 2 (Week 1): Pt will be able to wash her L arm with mod A. OT Short Term Goal 2 - Progress (Week 1): Met OT Short Term Goal 3 (Week 1): Pt will  be able to sit to EOB with mod A of 1. OT Short Term Goal 3 - Progress (Week 1): Progressing toward goal OT Short Term Goal 4 (Week 1): Pt will be able to rise to stand in stedy lift with max A of 1. OT Short Term Goal 4 - Progress (Week 1): Progressing toward goal OT Short Term Goal 5 (Week 1): Pt will be able to self feed with min A. OT Short Term Goal 5 - Progress (Week 1): Met Week 2:  OT Short Term Goal 1 (Week 2): Pt will be able to sit at EOB with mod A of 1 to prepare for transfers. OT Short Term Goal 2 (Week 2): Pt will be able to sit to stand at sink with mod A of 1. OT Short Term Goal 3 (Week 2): Pt will don shirt with mod A. OT Short Term Goal 4 (Week 2): Pt will complete toilet transfers with max A.  Skilled Therapeutic Interventions/Progress Updates:    Pt received in w/c ready for therapy. Pt agreeable to brush teeth so she could drink water.  Placed items on L side of sink so pt would scan to find items. She initially needed a few cues to find items and then could turn her head to find items fairly easily.  She brushed teeth and then drank 3 oz of water with a straw.   Engaged pt in UB self care (new shorts had just been put on patient).  She is attending to L arm with less cues and could wash it with A to lift arm and donned shirt with max  A.  Worked on sit to stands at sink with mod A to rise to stand with +2 but then needed max A to maintain balance with +2 A due to pushing towards her L.    Sitting in wc pt continues to lean to her L, placed towel wedge under L side of cushion to facilitate her pelvis to be in a neutral position.    In w.c, worked on [employer] with guiding towel pushing on a board.  Pt had trace movement in shoulder and bicep. 2 x she was able to slide her hand on board a few inches.    Pt resting in wc with all needs met and belt alarm on with pillow support under her arm.    Therapy Documentation Precautions:  Precautions Precautions: Fall Precaution  Comments: R hemicraniectomy- skull in R abdomen, L shoulder subluxation, L lateral lean and inattention, needs helmet for OOB, L weakness Restrictions Weight Bearing Restrictions: No    Vital Signs: Therapy Vitals Temp: 98.7 F (37.1 C) Pulse Rate: 60 Resp: (!) 24 BP: 140/70 Patient Position (if appropriate): Sitting Oxygen Therapy SpO2: 100 % O2 Device: Room Air Pain:  no c/o pain   Therapy/Group: Individual Therapy  [place] 12/02/2019, 3:38 PM

## 2019-12-02 NOTE — Plan of Care (Signed)
  Problem: Consults Goal: RH STROKE PATIENT EDUCATION Description: See Patient Education module for education specifics  Outcome: Progressing Goal: Nutrition Consult-if indicated Outcome: Progressing   Problem: RH BLADDER ELIMINATION Goal: RH STG MANAGE BLADDER WITH ASSISTANCE Description: STG Manage Bladder With min Assistance Outcome: Progressing   Problem: RH SKIN INTEGRITY Goal: RH STG SKIN FREE OF INFECTION/BREAKDOWN Description: Pt will be free of skin breakdown/infection with min assist while in CIR Outcome: Progressing   Problem: RH SAFETY Goal: RH STG ADHERE TO SAFETY PRECAUTIONS W/ASSISTANCE/DEVICE Description: STG Adhere to Safety Precautions With supervision Assistance/Device. Outcome: Progressing   Problem: RH KNOWLEDGE DEFICIT Goal: RH STG INCREASE KNOWLEDGE OF DIABETES Description: Pt/family will be able to demonstrate understanding of DM management with mod I assist using handouts/booklets prior to DC Outcome: Progressing Goal: RH STG INCREASE KNOWLEDGE OF HYPERTENSION Description: Pt/family will be able to demonstrate understanding of HTN management with mod I assist using handouts/booklets prior to DC Outcome: Progressing Goal: RH STG INCREASE KNOWLEDGE OF DYSPHAGIA/FLUID INTAKE Description: Pt/family will be able to demonstrate understanding of dysphagia management and aspiration risk with mod I assist using handouts/booklets prior to DC Outcome: Progressing Goal: RH STG INCREASE KNOWLEGDE OF HYPERLIPIDEMIA Description: Pt/family will be able to demonstrate understanding of HLD management with mod I assist using handouts/booklets prior to DC Outcome: Progressing Goal: RH STG INCREASE KNOWLEDGE OF STROKE PROPHYLAXIS Description: Pt/family will be able to demonstrate understanding of stroke prevention with mod I assist using handouts/booklets prior to DC Outcome: Progressing   Problem: RH Vision Goal: RH LTG Vision (Specify) Outcome: Progressing

## 2019-12-02 NOTE — Progress Notes (Signed)
Patient's staples removed on right head and right lower abdomen. No drainage, foul smell, or irritation. Areas left open to air.

## 2019-12-03 ENCOUNTER — Inpatient Hospital Stay (HOSPITAL_COMMUNITY): Payer: Medicare Other | Admitting: Physical Therapy

## 2019-12-03 ENCOUNTER — Inpatient Hospital Stay (HOSPITAL_COMMUNITY): Payer: Medicare Other

## 2019-12-03 ENCOUNTER — Inpatient Hospital Stay (HOSPITAL_COMMUNITY): Payer: Medicare Other | Admitting: Occupational Therapy

## 2019-12-03 ENCOUNTER — Encounter (HOSPITAL_COMMUNITY): Payer: Medicare Other | Admitting: Speech Pathology

## 2019-12-03 LAB — GLUCOSE, CAPILLARY
Glucose-Capillary: 71 mg/dL (ref 70–99)
Glucose-Capillary: 296 mg/dL — ABNORMAL HIGH (ref 70–99)
Glucose-Capillary: 243 mg/dL — ABNORMAL HIGH (ref 70–99)
Glucose-Capillary: 168 mg/dL — ABNORMAL HIGH (ref 70–99)

## 2019-12-03 MED ORDER — ENSURE ENLIVE PO LIQD
237.0000 mL | Freq: Three times a day (TID) | ORAL | Status: DC
  Administered 2019-12-03 – 2019-12-09 (×14): 237 mL via ORAL

## 2019-12-03 NOTE — Progress Notes (Signed)
Occupational Therapy Session Note  Patient Details  Name: Krystal Little MRN: [id number] Date of Birth: [birth year]  Today's Date: 12/03/2019 OT Individual Time: 1445-1530 OT Individual Time Calculation (min): 45 min    Short Term Goals: Week 2:  OT Short Term Goal 1 (Week 2): Pt will be able to sit at EOB with mod A of 1 to prepare for transfers. OT Short Term Goal 2 (Week 2): Pt will be able to sit to stand at sink with mod A of 1. OT Short Term Goal 3 (Week 2): Pt will don shirt with mod A. OT Short Term Goal 4 (Week 2): Pt will complete toilet transfers with max A.  Skilled Therapeutic Interventions/Progress Updates:  Patient met seated EOB at conclusion of previous OT treatment session. Patient reports slight pain in RLE and discomfort in L scapula. Return to supine with +2 assist at trunk and BLE. Patient in agreement with OT treatment session with focus on self-care re-education, bed mobility, and NMR as detailed below. Max A to doff/don UB clothing with use of hemi technique and to wash UB. Please refer to caretool for level of assistance. Max A required for rolling to wash buttocks and don brief. NMR with focus on scapular mobility in supine. Session concluded with patient lying supine in bed with call bell within reach, bed alarm activated, and all needs met.   Therapy Documentation Precautions:  Precautions Precautions: Fall Precaution Comments: R hemicraniectomy- skull in R abdomen, L shoulder subluxation, L lateral lean and inattention, needs helmet for OOB, L weakness Restrictions Weight Bearing Restrictions: No General:     Therapy/Group: Individual Therapy  [name] [name] 12/03/2019, 3:19 PM

## 2019-12-03 NOTE — Patient Care Conference (Signed)
Inpatient Rehabilitation Team Conference and Plan of Care Update Date: 12/03/2019   Time: 2:27 PM    Patient Name: Krystal Little      Medical Record Number: [id number]  Date of Birth: 1956/05/02 Sex: Female         Room/Bed: 4W14C/4W14C-02 Payor Info: Payor: [employer] MEDICARE / Plan: [facility] MEDICARE / Product Type: *No Product type* /    Admit Date/Time:  11/20/2019  6:17 PM  Primary Diagnosis:  Right middle cerebral artery stroke [facility])  Patient Active Problem List   Diagnosis Date Noted  . Essential hypertension   . Benign essential HTN   . Labile blood glucose   . Status post craniectomy   . Dysphagia, post-stroke   . Atrial fibrillation ([place])   . Left hemiparesis ([place])   . Cerebral edema ([place]) 11/20/2019  . ICH (intracerebral hemorrhage), SAH ([place]) result of ischemic stroke s/p crani 11/20/2019  . Dysphagia due to recent cerebral infarction 11/20/2019  . Acute blood loss anemia 11/20/2019  . Right middle cerebral artery stroke ([place]) 11/20/2019  . History of ETT   . Hypokalemia   . Acute respiratory failure with hypoxemia ([place]) 11/02/2019  . Cerebrovascular accident (CVA) due to embolism of right middle cerebral artery ([place]) 10/31/2019  . Chronic venous insufficiency 10/07/2019  . Wound of RLE  08/18/2019  . Hyperlipidemia associated with type 2 diabetes mellitus ([place]) 09/25/2016  . Diastolic dysfunction without heart failure 06/02/2015  . Health care maintenance 03/24/2013  . Hypertension associated with diabetes ([place]) 10/16/2011  . Long term current use of anticoagulant therapy 08/27/2010  . DM (diabetes mellitus), type 2, uncontrolled ([place]) 02/11/2008  . MITRAL REGURGITATION - status post MVR 09/14/1999  . S/P MVR (mitral valve repair) 08/09/1999  . Atrial fibrillation with RVR ([place]) 08/03/1999  . S/P placement of cardiac pacemaker 07/18/1999    Expected Discharge Date: Expected Discharge Date: 12/25/19(tentitive discharge extension to 12/25/19)  Team Members  Present: Physician leading conference: Dr. Delice Lesch Care Coodinator Present: Nestor Lewandowsky, RN, BSN, CRRN;Christina Sampson Goon, [place] Nurse Present: Genene Churn, RN PT Present: Deniece Ree, PT OT Present: Meriel Pica, OT SLP Present: Jettie Booze, CF-SLP PPS Coordinator present : Gunnar Fusi, SLP     Current Status/Progress Goal Weekly Team Focus  Bowel/Bladder   Pt in continent/incontinent. LBM 12/01/19  Patient will not be incontinemt.  Timed toileting Q2h   Swallow/Nutrition/ Hydration   Dysphagia 2, thin, full supervision  Supervision  tolerance upgraded diet, independence with self feeding and swallow precautions   ADL's             Mobility   mod-maxX2 for bed mobility, ModAx2 transfers, gait 8-35ft at rail or 3 musketeers  MinA LRAD  still working on bed mobility/transfers, sitting/standing balance, gait, maintaining midline   Communication   Min-Mod A intelligbility  Supervision  increased vocal intensity   Safety/Cognition/ Behavioral Observations  Mod A  Min A  Sustained attention, functional problem solving   Pain   Pt denies being in pain at this time  To remain pain free.  Keeping the pts pain below 3   Skin   Pt has skin breakdown noted to the sacral area, foam dressing in place. Pt has incision on head and abd= OTA.  Q2 turns, foam dressings, keep dry  reduce risk of further breakdown/infection.    Rehab Goals Patient on target to meet rehab goals: Yes Rehab Goals Revised: Patient on target with current set goals *See Care Plan and progress notes for  long and short-term goals.     Barriers to Discharge  Current Status/Progress Possible Resolutions Date Resolved   Nursing                  PT  Inaccessible home environment;Medical stability;Home environment access/layout  still a fairly high level of physical assist, may need ramp for home entry              [place] stability   on target           Discharge Planning/Teaching Needs:  Patient and spouse plan to discharge home  Will schedule education if recommended   Team Discussion:  Stasis ulcer right shin healing, Cognition is improving along with head and neck control, midline awareness and vision. Discussed approval for E-Stim on left shoulder given patient has a pacemaker on the right side of chest.  Revisions to Treatment Plan:  Discussed extension of ELOS to 12/25/19. SLP upgraded goals for cognition to supervision and Diet advanced after MBS to D2 thin.    Medical Summary Current Status: Left-sided hemiparesis and right gaze deviation secondary to large right MCA infarct due to right ICA occlusion status post revascularization/endovascular thrombectomy complicated by worsening edema and mass-effect status post decompressive right hemicraniectomy placement of bone flap into abdominal subcutaneous pocket 11/02/2019. Weekly Focus/Goal: Improve mobility, CBGs, dysphagia, HTN  Barriers to Discharge: Incontinence;Medical stability   Possible Resolutions to Barriers: Therapies, optimize HTN/DM meds, continue to advance diet as tolerated   Continued Need for Acute Rehabilitation Level of Care: The patient requires daily medical management by a physician with specialized training in physical medicine and rehabilitation for the following reasons: Direction of a multidisciplinary physical rehabilitation program to maximize functional independence : Yes Medical management of patient stability for increased activity during participation in an intensive rehabilitation regime.: Yes Analysis of laboratory values and/or radiology reports with any subsequent need for medication adjustment and/or medical intervention. : Yes   I attest that I was present, lead the team conference, and concur with the assessment and plan of the team.   Dorien Chihuahua B 12/03/2019, 2:27 PM

## 2019-12-03 NOTE — Progress Notes (Signed)
Occupational Therapy Session Note  Patient Details  Name: Krystal Little MRN: [id number] Date of Birth: June 01, 1956  Today's Date: 12/03/2019 OT Individual Time: 5686-1683 OT Individual Time Calculation (min): 60 min    Short Term Goals: Week 2:  OT Short Term Goal 1 (Week 2): Pt will be able to sit at EOB with mod A of 1 to prepare for transfers. OT Short Term Goal 2 (Week 2): Pt will be able to sit to stand at sink with mod A of 1. OT Short Term Goal 3 (Week 2): Pt will don shirt with mod A. OT Short Term Goal 4 (Week 2): Pt will complete toilet transfers with max A.  Skilled Therapeutic Interventions/Progress Updates:    Pt received in w/c ready for therapy. This session focused on NMR for LUE and postural control.   [place] stim one placed on L forearm to stimulate L wrist and finger extension. Pt tolerated well, used estim for 25 minutes while working on hand over hand facilitation for grasp and release of a squeeze ball.  No active finger movement but used it as a functional task for estim. Facilitated biceps but no activity noted today.    Estim unit moved to L deltoid for passive cyclic deltoid stimulation for 30 minutes while pt worked on sitting balance unsupported at edge of wc with dynamic reaching to her R to offset her lean to the left. Pt able to maintain static sit with CGA and dynamic sit with R reach with min A.  Improved trunk elongation with upright sitting. Excellent attention to tasks.   Pt completed squat pivot transfer to R with mod A of 1 with 2nd person present for safety.    Sit to sidelying with max A of 2.  Pt supine in bed and next OT arrived for her next therapy session.     Therapy Documentation Precautions:  Precautions Precautions: Fall Precaution Comments: R hemicraniectomy- skull in R abdomen, L shoulder subluxation, L lateral lean and inattention, needs helmet for OOB, L weakness Restrictions Weight Bearing Restrictions: No    Pain: Pain  Assessment Pain Scale: 0-10 Pain Score: 0-No pain   Therapy/Group: Individual Therapy  [place] 12/03/2019, 3:31 PM

## 2019-12-03 NOTE — Progress Notes (Addendum)
[facility] PHYSICAL MEDICINE & REHABILITATION PROGRESS NOTE  Subjective/Complaints: Patient seen laying in bed this AM. She states she slept well overnight.  She has questions regarding discharge date.   ROS: Denies CP, SOB, N/V/D  Objective: Vital Signs: Blood pressure 132/77, pulse 66, temperature 98.2 F (36.8 C), resp. rate 18, height 5\' 4"  (1.626 m), weight 63.3 kg, SpO2 98 %. No results found. Recent Labs    12/02/19 0612  WBC 5.3  HGB 11.6*  HCT 35.7*  PLT 196   No results for input(s): NA, K, CL, CO2, GLUCOSE, BUN, CREATININE, CALCIUM in the last 72 hours.  Physical Exam: BP 132/77 (BP Location: Right Arm)   Pulse 66   Temp 98.2 F (36.8 C)   Resp 18   Ht 5\' 4"  (1.626 m)   Wt 63.3 kg   SpO2 98%   BMI 23.95 kg/m   Constitutional: No distress . Vital signs reviewed. HENT: Right craniectomy C/D/I Eyes: Right gaze preference. No discharge. Cardiovascular: No JVD. Respiratory: Normal effort.  No stridor. GI:  Bone flap right lower quadrant C/D/I Skin: RLE venous stasis ulcer improving Psych: Flat. Musc: No edema in extremities.  No tenderness in extremities. Neuro: Alert Motor: RUE: 4/5 proximal distal RLE: 4/5 proximal distal LUE: 0/5 proximal distal, unchanged LLE: Hip flexion, knee extension 2/5, wiggles toes with apraxia, unchanged Left facial weakness Dysarthria, stable  Assessment/Plan: 1. 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  Physiatrist is providing close team supervision and 24 hour management of active medical problems listed below.  Physiatrist and rehab team continue to assess barriers to discharge/monitor patient progress toward functional and medical goals  Care Tool:  Bathing    Body  parts bathed by patient: Chest, Face, Left arm, Abdomen(UB only)   Body parts bathed by helper: Right arm     Bathing assist Assist Level: 2 Helpers     Upper Body Dressing/Undressing Upper body dressing   What is the patient wearing?: Pull over shirt    Upper body assist Assist Level: Maximal Assistance - Patient 25 - 49%    Lower Body Dressing/Undressing Lower body dressing      What is the patient wearing?: Pants     Lower body assist Assist for lower body dressing: Total Assistance - Patient < 25%     Toileting Toileting    Toileting assist Assist for toileting: 2 Helpers     Transfers Chair/bed transfer  Transfers assist     Chair/bed transfer assist level: 2 Helpers     Locomotion Ambulation   Ambulation assist   Ambulation activity did not occur: Safety/medical concerns  Assist level: 2 helpers Assistive device: Other (comment)(3 muskateer assist) Max distance: 60ft   Walk 10 feet activity   Assist  Walk 10 feet activity did not occur: Safety/medical concerns  Assist level: 2 helpers Assistive device: Other (comment)(3 muskateer assist)   Walk 50 feet activity   Assist Walk 50 feet with 2 turns activity did not occur: Safety/medical concerns         Walk 150 feet activity   Assist Walk 150 feet activity did not occur: Safety/medical concerns(lethargy)         Walk 10 feet on uneven surface  activity   Assist Walk 10 feet on uneven surfaces activity did not occur: Safety/medical concerns(lethargy)  Wheelchair     Assist Will patient use wheelchair at discharge?: Yes Type of Wheelchair: (TBD)    Wheelchair assist level: Dependent - Patient 0%      Wheelchair 50 feet with 2 turns activity    Assist        Assist Level: Dependent - Patient 0%   Wheelchair 150 feet activity     Assist     Assist Level: Dependent - Patient 0%    Medical Problem List and Plan: 1. Left-sided hemiparesis  and right gaze deviation secondary to large right MCA infarct due to right ICA occlusion status post revascularization/endovascular thrombectomy complicated by worsening edema and mass-effect status post decompressive right hemicraniectomy placement of bone flap into abdominal subcutaneous pocket 11/02/2019.    Repeat head CT personally reviewed, and reviewed with patient, showing some improvement  Helmet for safety when OOB.  WHO/PRAFO  Communicated with therapies, shoulder sling ordered.  Continue CIR  Team conference today to discuss current and goals and coordination of care, home and environmental barriers, and discharge planning with nursing, case manager, and therapies.  2.  Antithrombotics:  -DVT/anticoagulation:    Discussed with neurology, continue Eliquis             -antiplatelet therapy: Aspirin 325 mg daily initiated 11/17/2019 3. Pain Management: Tylenol as needed. Well controlled 4. Mood: Amantadine 100 mg twice daily             -antipsychotic agents: N/A 5. Neuropsych: This patient is not fully capable of making decisions on her own behalf. 6. Skin/Wound Care: Routine skin checks  5/16: Gerhardt's butt cream ordered for anal fissure.  7. Fluids/Electrolytes/Nutrition: Routine in and outs.    BMP within acceptable range, except for glucose on 5/10 8. Atrial fibrillation/pacemaker.    Failed Coumadin, started Eliquis  Cardiac rate controlled.    Continue Cardizem 60 mg every 6 hours as well as Lopressor 25 mg twice daily.    Follow-up cardiology services as needed 9. Acute blood loss anemia.    Hemoglobin 11.6 on 5/18   Continue to monitor 10. Uncontrolled diabetes mellitus type 2 with hyperglycemia.  Hemoglobin A1c 9.7.    NovoLog 8 units BID with breakfast and lunch  Lantus insulin 21 units twice daily, decreased to 16 twice daily on 5/14, increased to 18 twice daily on 5/16, decreased to 17 twice daily on 5/18  Check blood sugars before meals and at bedtime.  Labile on  5/19, will not make changes today, given changes yesterday, may need to increase Novolog after AM CBGs improve 11. Post stroke dysphagia.  Dysphagia #1 nectar liquids.  Follow-up speech therapy  P.o. intake improving  MBS today  NG DC'd on 5/13 12. Hyperlipidemia.  Lipitor 13. Essential HTN  See #8  Started lisinopril 2.5 daily on 5/16  Controlled on 5/19   LOS: 13 days A FACE TO FACE EVALUATION WAS PERFORMED  Michalina Calbert Lorie Phenix 12/03/2019, 8:43 AM

## 2019-12-03 NOTE — Progress Notes (Signed)
Nutrition Follow-up  RD working remotely.  DOCUMENTATION CODES:   Not applicable  INTERVENTION:   -Ensure Enlive po TID, each supplement provides 350 kcal and 20 grams of protein  - Pro-stat 30 ml po BID, each supplement provides 100 kcal and 15 grams of protein  - Continue feeding assistance with meals  -MagicCup BID with lunch and dinner meals, each supplement provides 290 kcal and 9 grams of protein  -Encourage adequate PO intake  NUTRITION DIAGNOSIS:   Inadequate oral intake related to dysphagia as evidenced by meal completion < 50%, per patient/family report.  Progressing  GOAL:   Patient will meet greater than or equal to 90% of their needs  Progressing  MONITOR:   PO intake, Supplement acceptance, Diet advancement, Labs, Weight trends, TF tolerance, Skin  REASON FOR ASSESSMENT:   Consult Enteral/tube feeding initiation and management  ASSESSMENT:   64 year old female with PMH of HTN, DM, chronic atrial fibrillation. Presented 10/31/19 with left-sided weakness, neglect, and right gaze deviation. Cranial CT scan as well as CT angiogram head and neck showed a large right MCA territory nonhemorrhagic infarction. Hyperdense right MCA suggesting extensive thrombus from the right ICA terminus to the MCA bifurcation. Pt underwent endovascular thrombectomy revascularization per IR. Follow-up imaging showed worsening edema involving the right middle cerebral artery causing significant radiologic mass-effect and pt underwent decompressive right hemicraniectomy placement of bone flap into abdominal subcutaneous pocket on 11/02/19. Currently maintained on a dysphagia 1 diet with nectar-thick liquids and nasogastric tube feeds for nutritional support. Admitted to CIR on 5/06.  5/09 - nocturnal TF rate decreased to stimulate appetite 5/12 - TF d/c 5/13 - Cortrak d/c 5/19 - MBS, diet advanced to dysphagia 2 with thin liquids  Unable to reach pt via phone call to  room.  Pt accepting 100% of Pro-stat supplements per MAR. Will continue with Pro-stat. Given diet advancement, RD will d/c Hormel shakes and order Ensure Enlive between meals.  Current weight: 63.3 kg Admit weight: 66.7 kg  Weight trending up over the last 4 days. Per RN edema assessment, pt with non-pitting edema to BUE.  Meal Completion: 25-75% (averaging 50%)  Medications reviewed and include: Pro-stat BID, SSI, Lantus 17 units daily, protonix  Labs reviewed. CBG's: 71-193 x 24 hours  Diet Order:   Diet Order            DIET DYS 2 Room service appropriate? Yes; Fluid consistency: Thin  Diet effective now              EDUCATION NEEDS:   No education needs have been identified at this time  Skin:  Skin Assessment: Skin Integrity Issues: Incisions: incision to groin, head, abdomen Other: pretibial venous stasis ulcer  Last BM:  12/02/19 large type 4  Height:   Ht Readings from Last 1 Encounters:  11/20/19 5\' 4"  (1.626 m)    Weight:   Wt Readings from Last 1 Encounters:  12/03/19 63.3 kg    Ideal Body Weight:  54.5 kg  BMI:  Body mass index is 23.95 kg/m.  Estimated Nutritional Needs:   Kcal:  1550-1750  Protein:  75-90 grams  Fluid:  >/= 1.6 L    Gaynell Face, MS, RD, LDN Inpatient Clinical Dietitian Pager: [phone] Weekend/After Hours: [phone]

## 2019-12-03 NOTE — Progress Notes (Signed)
Modified Barium Swallow Progress Note  Patient Details  Name: Krystal Little MRN: [id number] Date of Birth: 11/02/1955  Today's Date: 12/03/2019  Modified Barium Swallow completed.  Full report located under Chart Review in the Imaging Section.  Brief recommendations include the following:  Clinical Impression  Pt continues to present with mild pharyngeal and moderate oral dysphagia, however improved ability to protect airway in comparison to previous MBSS. No aspiration events were observed throughout today's study. Although thin barium pools in the pyriform sinuses prior to swallow initiation, only 1 instance of trace penetration was noted. Penetration above the level of the vocal folds also observed with dual consistency PO. Also of note, pt has been tolerating free water protocol without clinical signs of respiratory distress and very minimal s/sx aspiration. Pt's oral phase impairments include decreased bolus cohesion, reduced AP transit of advanced solids and dual consistencies, and anterior loss. Pt's mastication was more efficient and more complete oral clearance achieved with Dysphagia 2 (minced) textures in comparison to Dysphagia 3 (mech soft). Pt ultimately expectorated barium pill after 2 unsuccessful attempts to deglutate pill with thin barium due to decreased bolus cohesion and AP transit. Would recommend pt upgrade to Dysphagia 2 (minced/ground) solids and thin liquids, avoid dual consistency POs (ex: cereal, fruit cocktail, etc.), and continue medications crushed in puree. Full supervision should still be provided during meals. Encourage use of straw and self feeding.   Swallow Evaluation Recommendations       SLP Diet Recommendations: Dysphagia 2 (Fine chop) solids;Thin liquid   Liquid Administration via: Straw;Cup   Medication Administration: Crushed with puree   Supervision: Staff to assist with self feeding;Full supervision/cueing for compensatory strategies    Compensations: Slow rate;Small sips/bites;Minimize environmental distractions   Postural Changes: Remain semi-upright after after feeds/meals (Comment);Seated upright at 90 degrees   Oral Care Recommendations: Oral care BID   Other Recommendations: Have oral suction available    [name] 12/03/2019,9:49 AM

## 2019-12-03 NOTE — Progress Notes (Signed)
Physical Therapy Session Note  Patient Details  Name: Krystal Little MRN: [id number] Date of Birth: 1955/12/01  Today's Date: 12/03/2019 PT Individual Time: 0950-1100 PT Individual Time Calculation (min): 70 min   Short Term Goals: Week 2:  PT Short Term Goal 1 (Week 2): Patient to be able to gait train 77f with LRAD and Mod assist PT Short Term Goal 2 (Week 2): Patient to be able to perform bed  <-> chair transfers with MinAx2, LRAD PT Short Term Goal 3 (Week 2): Patient to be able to maintain midline sitting and standing iwth no more than MinA/Min cues  Skilled Therapeutic Interventions/Progress Updates:    Patient received in bed, pleasant and willing to work with therapy today. Able to complete bed mobility with ModA today, then able to perform stand-pivot transfer with ModAx[facility] to [facility], however started urinating before we had the chance to scoot her hips all the way back on the commode and ended up soiling the floor- totalA for clothing management to put on clean socks and change brief. Performed multiple [facility]AX1 while second person assisted in pulling up new brief and shorts and switched out BSC for WC. Did spend time selecting standard height WC with back support and full lap tray to attempt to progress from TIS WC. Otherwise worked on maintaining midline in sitting and standing today- continues to have difficulty with push/heavy lean to L and finding/maintaining midline and needed max cues/Mod-maxA to maintain midline in standing as well. Tends to lean/push back to left when cues are removed and has poor awareness to upright. Left up in new WC with daughter [name] and all needs otherwise met this morning, seatbelt alarm active.   Therapy Documentation Precautions:  Precautions Precautions: Fall Precaution Comments: R hemicraniectomy- skull in R abdomen, L shoulder subluxation, L lateral lean and inattention, needs helmet for OOB, L  weakness Restrictions Weight Bearing Restrictions: No    Pain: Pain Assessment Pain Scale: 0-10 Pain Score: 0-No pain    Therapy/Group: Individual Therapy   KWindell Norfolk DPT, PN1   Supplemental Physical Therapist [place]   Pager [phone] Rehab Office [phone]   12/03/2019, 12:18 PM

## 2019-12-04 ENCOUNTER — Inpatient Hospital Stay (HOSPITAL_COMMUNITY): Payer: Medicare Other | Admitting: Occupational Therapy

## 2019-12-04 ENCOUNTER — Inpatient Hospital Stay (HOSPITAL_COMMUNITY): Payer: Medicare Other | Admitting: Speech Pathology

## 2019-12-04 ENCOUNTER — Inpatient Hospital Stay (HOSPITAL_COMMUNITY): Payer: Medicare Other | Admitting: Physical Therapy

## 2019-12-04 LAB — GLUCOSE, CAPILLARY
Glucose-Capillary: 113 mg/dL — ABNORMAL HIGH (ref 70–99)
Glucose-Capillary: 140 mg/dL — ABNORMAL HIGH (ref 70–99)
Glucose-Capillary: 133 mg/dL — ABNORMAL HIGH (ref 70–99)
Glucose-Capillary: 246 mg/dL — ABNORMAL HIGH (ref 70–99)

## 2019-12-04 MED ORDER — INSULIN GLARGINE 100 UNIT/ML ~~LOC~~ SOLN
16.0000 [IU] | Freq: Two times a day (BID) | SUBCUTANEOUS | Status: DC
  Administered 2019-12-04 – 2019-12-05 (×2): 16 [IU] via SUBCUTANEOUS
  Filled 2019-12-04 (×3): qty 0.16

## 2019-12-04 MED ORDER — DICLOFENAC SODIUM 1 % EX GEL
2.0000 g | Freq: Four times a day (QID) | CUTANEOUS | Status: DC
  Administered 2019-12-04 – 2019-12-13 (×32): 2 g via TOPICAL
  Filled 2019-12-04 (×2): qty 100

## 2019-12-04 NOTE — Progress Notes (Signed)
Physical Therapy Session Note  Patient Details  Name: Krystal Little MRN: [id number] Date of Birth: 1956-03-02  Today's Date: 12/04/2019 PT Individual Time: 0800-0858 PT Individual Time Calculation (min): 58 min   Short Term Goals: Week 2:  PT Short Term Goal 1 (Week 2): Patient to be able to gait train 70f with LRAD and Mod assist PT Short Term Goal 2 (Week 2): Patient to be able to perform bed  <-> chair transfers with MinAx2, LRAD PT Short Term Goal 3 (Week 2): Patient to be able to maintain midline sitting and standing iwth no more than MinA/Min cues  Skilled Therapeutic Interventions/Progress Updates:    Patient received in bed, pleasant and motivated to work with therapy today; daughter [name] and able to assist with mobility today. Able to roll to L side with MinA but required ModA for rolling to R side for donning of shorts in bed, then donned shirt/helmet/sling with max-totalA. From there able to get to EOB with ModA and cues for attending to/bringing L LE along with her and most assist was for managing L side today. Used stedy to transfer from bed to chair with MinAx2 and Miranda's assistance. Otherwise spent session continuing to work on finding and maintaining midline at rViningsin the hallway- actually able to come to full stand and take steps with only MinA from therapist today with mod cues for upright and weight shifting/min-mod facilitation for progressing L LE and weight shifts. Introduced L AFO today- seemed to help some but need further assessment and likely assessment from [employer] rep. Able to maintain balance very well in standard WC without full lap tray today so left up with seatbelt alarm active and daughter present, all needs otherwise met this morning.   Therapy Documentation Precautions:  Precautions Precautions: Fall Precaution Comments: R hemicraniectomy- skull in R abdomen, L shoulder subluxation, L lateral lean and inattention, needs helmet for OOB, L  weakness Restrictions Weight Bearing Restrictions: No Pain: Pain Assessment Pain Scale: 0-10 Pain Score: 0-No pain    Therapy/Group: Individual Therapy   KWindell Norfolk DPT, PN1   Supplemental Physical Therapist [place]   Pager [phone] Rehab Office [phone]   12/04/2019, 12:10 PM

## 2019-12-04 NOTE — Progress Notes (Signed)
Occupational Therapy Session Note  Patient Details  Name: Krystal Little MRN: [id number] Date of Birth: [birth year]  Today's Date: 12/04/2019 OT Individual Time: 7408-1448 OT Individual Time Calculation (min): 60 min    Short Term Goals: Week 3:     Skilled Therapeutic Interventions/Progress Updates:  Pt received up in w/c agreeable to participating in bathing interventions this morning.  Requesting to toilet as well.    Pt completed SPT w/c using grab bar to 3 in 1 commode with min assist to block and assist LLE.  Pt reporting right hip pain while on commode and intermittently throughout session.    Stedy used for STS with max assist to maintain static standing and for dependent pericare.  3 in 1 commode to shower bench with max VCs and mod assist to maintain upright posture secondary to left lateral leaning.    Educated pt with visual demonstration on use of long handled sponge using hemitechnique due to current flaccidity in LUE.  Neuro re-ed to facilitate attention to left side when washing UB needing mod verbal and tactile cues.  Provided dependent hand over hand technique to left hand to facilitate normal and purposeful movement patterns to wash left upper leg.  Pt needing continuous cues to initiate each step of bathing task  Pt exhibited frequent and intermittent left lateral leaning throughout bathing however self corrects with VCs using external targets.  Min assist +2 STS using stedy to transfer from shower chair to EOB due to pt unable to sit fully upright despite cues. Bed level dressing complete with max assist due to pt with increased fatigue and decreased attention and slower processing after shower.   Pt needed more cueing today to initiate steps of tasks and to self correct upright trunk posture as well as attend to left side of body.  Call bell in reach, bed rails placed, bed lock on.         Therapy Documentation Precautions:  Precautions Precautions: Fall Precaution  Comments: R hemicraniectomy- skull in R abdomen, L shoulder subluxation, L lateral lean and inattention, needs helmet for OOB, L weakness Restrictions Weight Bearing Restrictions: No   Pain: Pain Assessment Pain Scale: 0-10 Pain Score: 0-No pain     Therapy/Group: Individual Therapy  Ezekiel Slocumb 12/04/2019, 1:03 PM

## 2019-12-04 NOTE — Progress Notes (Signed)
Resting throughout shift, up the majority of shift, denies pain states she just want to watch her favorite basketball team play tonight, No acute changes in status, Repositioned and turned,,Surgical  Incision to head and abdomen healing well , intact no drainage or irritation, Continue medical regime, call bell paced on right side of bed and within reach, bed alarm on, monitor and assisted prn

## 2019-12-04 NOTE — Progress Notes (Signed)
Speech Language Pathology Daily Session Note  Patient Details  Name: Krystal Little MRN: [id number] Date of Birth: 1955/08/02  Today's Date: 12/04/2019 SLP Individual Time: 1430-1450 SLP Individual Time Calculation (min): 20 min  Short Term Goals: Week 2: SLP Short Term Goal 1 (Week 2): Pt will consume therapeutic trials of thin liquids with minimal overt s/s of aspiration and supervision cues for use of swallowing precautions over 3 consecutive sessions. SLP Short Term Goal 2 (Week 2): Pt will consume therapeutic trials of dys 2 textures with min cues to clear solids from the oral cavity and minimal overt s/s of aspiration over 3 consecutive sessions prior to advancement. SLP Short Term Goal 3 (Week 2): Pt will locate items needed for functional tasks at midline in >50% of opportunities with mod assist multimodal cues. SLP Short Term Goal 4 (Week 2): Pt will sustain her attention to basic, familiar tasks for 7 minute intervals with mod cues needed for redirection. SLP Short Term Goal 5 (Week 2): Pt will complete basic, familiar tasks with mod assist multimodal cues for functional problem solving SLP Short Term Goal 6 (Week 2): Pt will utilize an increased vocal intensity and slow rate of speech to achieve intelligibility at the phrase/short sentence level with min assist verbal cues.  Skilled Therapeutic Interventions: Pt was seen for skilled ST targeting speech goals. She was very limited by fatigue today, ultimately leading to 10 missed minutes of ST. However, when full alertness achieved, pt implemented increased vocal intensity to achieve ~75% intelligibility at the sentence level with Mod A verbal cues. Pt left laying in bed with alarm set and needs within reach. Continue per current plan of care.        Pain Pain Assessment Pain Scale: 0-10 Pain Score: 0-No pain   Therapy/Group: Individual Therapy  [name] 12/04/2019, 7:28 AM

## 2019-12-04 NOTE — Progress Notes (Signed)
[place] PHYSICAL MEDICINE & REHABILITATION PROGRESS NOTE  Subjective/Complaints: Patient seen sitting up in bed this morning, daughter bedside.  Patient states she slept well overnight.  She is quicker to engage and respond this morning.  She states her orthoses were not placed overnight, discussed with nursing.  Patient also states she was able to wiggle her fingers yesterday, but unfortunately, not reproducible.  ROS: Denies CP, SOB, N/V/D  Objective: Vital Signs: Blood pressure (!) (P) 141/67, pulse (P) 68, temperature (!) (P) 97.4 F (36.3 C), temperature source (P) Oral, resp. rate (P) 17, height 5\' 4"  (1.626 m), weight 61 kg, SpO2 (P) 98 %. DG Swallowing Func-Speech Pathology  Result Date: 12/03/2019 Objective Swallowing Evaluation: Type of Study: MBS-Modified Barium Swallow Study  Patient Details Name: Krystal Little MRN: [id number] Date of Birth: 10-02-1955 Today's Date: 12/03/2019 Past Medical History: Past Medical History: Diagnosis Date . Chronic atrial fibrillation ([place]) 1990s  s/p DCCV then attempted ablation of complex A Flutter (@ [facility] - Dr. Deno Etienne), failed antiarrhythmics --> INR folllwed @ [facility] ->> now status post pacemaker placement with underlying A. fib. . Diabetes mellitus type 2, uncontrolled, without complications   On Oral Medications ([place] [facility]) . Essential hypertension  . History of cardiac catheterization 2001  R&LHC - normal Coronaries, no evidence of Restictive Cardiomyopathy or Constrictive Pericarditis (also @ [facility]) . Hx of sick sinus syndrome 07/1999  Wtih symptomatic bradycardia - syncope ([place]) . 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 CARDIAC CATHETERIZATION  08/03/1999  [place] - normal Coronaries; no sign of constriction or restrictive cardiomypathy . CRANIECTOMY FOR DEPRESSED SKULL FRACTURE Right 11/02/2019  Procedure: DECOMPRESSIVE HEMI -CRANIECTOMY, IMPLANTATION OF SKULL FLAP TO RIGHT ABDOMEN;  Surgeon: Consuella Lose, MD;  Location: [place];  Service: Neurosurgery;  Laterality: Right; . IR CT HEAD LTD  10/31/2019 . IR PERCUTANEOUS ART THROMBECTOMY/INFUSION INTRACRANIAL INC DIAG ANGIO  10/31/2019    . IR PERCUTANEOUS ART THROMBECTOMY/INFUSION INTRACRANIAL INC DIAG ANGIO  10/31/2019 . MITRAL VALVE REPAIR  07/1999  Both Ant& Post leaflet repair - quadrangular resection&caudal transposition, #32 Sequin Annuloplasty ring . PACEMAKER GENERATOR CHANGE  11/26/2008  Medtronic Adapta L(? if it has been checked since 02/2013) . PACEMAKER INSERTION  07/1999  for SSS in setting of Chronic Afib; Medtronic Kappa [id number], [id number] H. Marland Kitchen RADIOLOGY WITH ANESTHESIA N/A 10/31/2019  Procedure: IR WITH ANESTHESIA;  Surgeon: Radiologist, Medication, MD;  Location: [place];  Service: Radiology;  Laterality: N/A; . TRANSTHORACIC ECHOCARDIOGRAM  05/2018  Normal LV size and thickness.  EF estimated 50%.  Inferobasal HK and flattened septum c/w with elevated PA pressures. Mild functional mitral stenosis at rest (postoperative).  Moderate left atrial dilation and mild right atrial dilation.  Mean PA pressures estimated 52 mmHg. HPI: 64 y.o. female presented to [facility] ED as a code stroke for R gaze deviation and L-sided weakness. CT revealed a large R MCA infarct. Pt s/p R hemicraniectomy with bone flap placed in abdomen on 4/18. ETT 4/18-4/25. PMH HTN, DM2, chronic a. Fib (coumadin) s/p pace maker. Pt admitted to [facility] 11/20/19.  Assessment / Plan / Recommendation  CHL IP CLINICAL IMPRESSIONS 12/03/2019 Clinical Impression Pt continues to present with mild pharyngeal and moderate  oral dysphagia, however improved ability to protect airway in comparison to previous MBSS. No aspiration events were observed throughout today's study. Although thin barium pools in the pyriform sinuses prior to swallow initiation, only 1 instance of trace penetration was noted. Penetration above the level of the vocal folds also observed with dual consistency PO. Also of note, pt has been tolerating free water protocol without clinical signs of respiratory distress and very minimal s/sx aspiration. Pt's oral phase impairments include decreased bolus cohesion, reduced AP transit of advanced solids and dual consistencies, and anterior loss. Pt's mastication was more efficient and more complete oral clearance achieved with Dysphagia 2 (minced) textures in comparison to Dysphagia 3 (mech soft). Pt ultimately expectorated barium pill after 2 unsuccessful attempts to deglutate pill with thin barium due to decreased bolus cohesion and AP transit. Would recommend pt upgrade to Dysphagia 2 (minced/ground) solids and thin liquids, avoid dual consistency POs (ex: cereal, fruit cocktail, etc.), and continue medications crushed in puree. Full supervision should still be provided during meals. Encourage use of straw and self feeding. SLP Visit Diagnosis Dysphagia, oropharyngeal phase (R13.12) Attention and concentration deficit following -- Frontal lobe and executive function deficit following -- Impact on safety and function Mild aspiration risk   CHL IP TREATMENT RECOMMENDATION 11/14/2019 Treatment Recommendations Therapy as outlined in treatment plan below   Prognosis 11/14/2019 Prognosis for Safe Diet Advancement Good Barriers to Reach Goals -- Barriers/Prognosis Comment -- CHL IP DIET RECOMMENDATION 12/03/2019 SLP Diet Recommendations Dysphagia 2 (Fine chop) solids;Thin liquid Liquid Administration via Straw;Cup Medication Administration Crushed with puree Compensations Slow rate;Small sips/bites;Minimize environmental  distractions Postural Changes Remain semi-upright after after feeds/meals (Comment);Seated upright at 90 degrees   CHL IP OTHER RECOMMENDATIONS 12/03/2019 Recommended Consults -- Oral Care Recommendations Oral care BID Other Recommendations Have oral suction available   CHL IP FOLLOW UP RECOMMENDATIONS 11/19/2019 Follow up Recommendations Inpatient Rehab   CHL IP FREQUENCY AND DURATION 11/14/2019 Speech Therapy Frequency (ACUTE ONLY) min 2x/week Treatment Duration 2 weeks      CHL IP ORAL PHASE 12/03/2019 Oral Phase Impaired Oral - Pudding Teaspoon -- Oral - Pudding Cup -- Oral - Honey Teaspoon -- Oral - Honey Cup -- Oral - Nectar Teaspoon NT Oral - Nectar Cup NT Oral - Nectar Straw NT Oral - Thin Teaspoon NT Oral - Thin Cup Lingual/palatal residue;Piecemeal swallowing;Delayed oral transit;Premature spillage Oral - Thin Straw Premature spillage;Piecemeal swallowing Oral - Puree NT Oral - Mech Soft Delayed oral transit;Decreased bolus cohesion;Impaired mastication;Weak lingual manipulation;Lingual pumping Oral - Regular -- Oral - Multi-Consistency Piecemeal swallowing;Decreased bolus cohesion Oral - Pill Reduced posterior propulsion;Lingual pumping Oral Phase - Comment --  CHL IP PHARYNGEAL PHASE 12/03/2019 Pharyngeal Phase Impaired Pharyngeal- Pudding Teaspoon -- Pharyngeal -- Pharyngeal- Pudding Cup -- Pharyngeal -- Pharyngeal- Honey Teaspoon -- Pharyngeal -- Pharyngeal- Honey Cup -- Pharyngeal -- Pharyngeal- Nectar Teaspoon NT Pharyngeal -- Pharyngeal- Nectar Cup -- Pharyngeal -- Pharyngeal- Nectar Straw NT Pharyngeal -- Pharyngeal- Thin Teaspoon NT Pharyngeal -- Pharyngeal- Thin Cup -- Pharyngeal -- Pharyngeal- Thin Straw Delayed swallow initiation-pyriform sinuses;Penetration/Aspiration before swallow Pharyngeal Material enters airway, CONTACTS cords and then ejected out Pharyngeal- Puree NT Pharyngeal -- Pharyngeal- Mechanical Soft Delayed swallow initiation-vallecula Pharyngeal -- Pharyngeal- Regular -- Pharyngeal  -- Pharyngeal- Multi-consistency Delayed swallow initiation-vallecula;Penetration/Aspiration during swallow Pharyngeal Material enters airway, remains ABOVE vocal cords then ejected out Pharyngeal- Pill Other (Comment) Pharyngeal -- Pharyngeal Comment --  CHL  IP CERVICAL ESOPHAGEAL PHASE 12/03/2019 Cervical Esophageal Phase WFL Pudding Teaspoon -- Pudding Cup -- Honey Teaspoon -- Honey Cup -- Nectar Teaspoon -- Nectar Cup -- Nectar Straw -- Thin Teaspoon -- Thin Cup -- Thin Straw -- Puree -- Mechanical Soft -- Regular -- Multi-consistency -- Pill -- Cervical Esophageal Comment -- [name] 12/03/2019, 9:50 AM              Recent Labs    12/02/19 0612  WBC 5.3  HGB 11.6*  HCT 35.7*  PLT 196   No results for input(s): NA, K, CL, CO2, GLUCOSE, BUN, CREATININE, CALCIUM in the last 72 hours.  Physical Exam: BP (!) (P) 141/67 (BP Location: Right Arm)   Pulse (P) 68   Temp (!) (P) 97.4 F (36.3 C) (Oral)   Resp (P) 17   Ht 5\' 4"  (1.626 m)   Wt 61 kg   SpO2 (P) 98%   BMI 23.08 kg/m   Constitutional: No distress . Vital signs reviewed. HENT: Right craniectomy C/D/I Eyes: Right gaze preference.  No discharge. Cardiovascular: No JVD. Respiratory: Normal effort.  No stridor. GI: Non-distended. Bone flap to right lower quadrant C/D/I Skin: Warm and dry.  Intact. Psych: Flat. Musc: No edema in extremities.  No tenderness in extremities. Neuro: Alert Motor: RUE: 4/5 proximal distal RLE: 4/5 proximal distal LUE: 0/5 proximal distal, persistent LLE: Hip flexion, knee extension, ankle dorsiflexion 2/5, improving Left facial weakness Dysarthria, stable  Assessment/Plan: 1. 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  Physiatrist is providing close  team supervision and 24 hour management of active medical problems listed below.  Physiatrist and rehab team continue to assess barriers to discharge/monitor patient progress toward functional and medical goals  Care Tool:  Bathing    Body parts bathed by patient: Chest, Face, Left arm, Abdomen, Front perineal area, Right upper leg   Body parts bathed by helper: Buttocks, Right arm, Left upper leg, Right lower leg, Left lower leg     Bathing assist Assist Level: Maximal Assistance - Patient 24 - 49%     Upper Body Dressing/Undressing Upper body dressing   What is the patient wearing?: Hospital gown only    Upper body assist Assist Level: Maximal Assistance - Patient 25 - 49%    Lower Body Dressing/Undressing Lower body dressing      What is the patient wearing?: Incontinence brief     Lower body assist Assist for lower body dressing: Total Assistance - Patient < 25%     Toileting Toileting    Toileting assist Assist for toileting: 2 Helpers     Transfers Chair/bed transfer  Transfers assist     Chair/bed transfer assist level: 2 Helpers     Locomotion Ambulation   Ambulation assist   Ambulation activity did not occur: Safety/medical concerns  Assist level: 2 helpers Assistive device: Other (comment)(3 muskateer assist) Max distance: 14ft   Walk 10 feet activity   Assist  Walk 10 feet activity did not occur: Safety/medical concerns  Assist level: 2 helpers Assistive device: Other (comment)(3 muskateer assist)   Walk 50 feet activity   Assist Walk 50 feet with 2 turns activity did not occur: Safety/medical concerns         Walk 150 feet activity   Assist Walk 150 feet activity did not occur:  Safety/medical concerns(lethargy)         Walk 10 feet on uneven surface  activity   Assist Walk 10 feet on uneven surfaces activity did not occur: Safety/medical concerns(lethargy)         Wheelchair     Assist Will patient use  wheelchair at discharge?: Yes Type of Wheelchair: (TBD)    Wheelchair assist level: Dependent - Patient 0%      Wheelchair 50 feet with 2 turns activity    Assist        Assist Level: Dependent - Patient 0%   Wheelchair 150 feet activity     Assist     Assist Level: Dependent - Patient 0%    Medical Problem List and Plan: 1. 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    Repeat head CT personally reviewed, and reviewed with patient, showing some improvement  Helmet for safety when OOB.  WHO/PRAFO nightly  Communicated with therapies, shoulder sling ordered.  Continue CIR 2.  Antithrombotics:  -DVT/anticoagulation:    Discussed with neurology, continue Eliquis             -antiplatelet therapy: Aspirin 325 mg daily initiated 11/17/2019 3. Pain Management: Tylenol as needed. Well controlled 4. Mood: Amantadine 100 mg twice daily             -antipsychotic agents: N/A 5. Neuropsych: This patient is not fully capable of making decisions on her own behalf. 6. Skin/Wound Care: Routine skin checks  5/16: Gerhardt's butt cream ordered for anal fissure.  7. Fluids/Electrolytes/Nutrition: Routine in and outs.    BMP within acceptable range, except for glucose on 5/10, labs ordered for tomorrow 8. Atrial fibrillation/pacemaker.    Failed Coumadin, started Eliquis  Cardiac rate controlled.    Continue Cardizem 60 mg every 6 hours as well as Lopressor 25 mg twice daily.    Follow-up cardiology services as needed 9. Acute blood loss anemia.    Hemoglobin 11.6 on 5/18   Continue to monitor 10. Uncontrolled diabetes mellitus type 2 with hyperglycemia.  Hemoglobin A1c 9.7.    NovoLog 8 units BID  with breakfast and lunch  Lantus insulin 21 units twice daily, decreased to 16 twice daily on 5/14, increased to 18 twice daily on 5/16, decreased to 17 twice daily on 5/18, decreased back to 16 twice daily on 5/20  Check blood sugars before meals and at bedtime.  Labile on 5/20, will consider increase of mealtime coverage in the next couple days 11. Post stroke dysphagia.    Advance diet to D2 thins   Continue to advance as tolerated 12. Hyperlipidemia.  Lipitor 13. Essential HTN  See #8  Started lisinopril 2.5 daily on 5/16  Appears to be relatively controlled on 5/20, vital signs?  Pending   LOS: 14 days A FACE TO FACE EVALUATION WAS PERFORMED  Neziah Braley Lorie Phenix 12/04/2019, 8:42 AM

## 2019-12-04 NOTE — Progress Notes (Addendum)
Occupational Therapy Session Note  Patient Details  Name: Krystal Little MRN: [id number] Date of Birth: 07-Nov-1955  Today's Date: 12/04/2019 OT Individual Time: 1315-1400 OT Individual Time Calculation (min): 45 min (unattended estim 1400 - 1445)   Short Term Goals: Week 2:  OT Short Term Goal 1 (Week 2): Pt will be able to sit at EOB with mod A of 1 to prepare for transfers. OT Short Term Goal 2 (Week 2): Pt will be able to sit to stand at sink with mod A of 1. OT Short Term Goal 3 (Week 2): Pt will don shirt with mod A. OT Short Term Goal 4 (Week 2): Pt will complete toilet transfers with max A.  Skilled Therapeutic Interventions/Progress Updates:    Pt received in bed completing her lunch. Pt agreeable to starting therapy to focus on LUE.  Due to B hip pain, worked with pt from bed level in "chair" position. To stimulate her L wrist and finger extension to facilitate grasp and release used estim with Saebo stim one on her forearm.  To facilitate a grasp, therapist provided hand over hand A to grasp a bottle and pull her arm back (no active finger movement) then pt worked on pushing arm straight (trace shoulder movement) and releasing bottle during on phase of stimulation.  Continued this exercise for 10 minutes and then had pt work on rolling a basketball back and forth with total A to manipulate ball. Pt needed mod to max cues to visually attend to her hand. Pt was quite lethargic this afternoon.  estim moved from forearm to L deltoid to stimulate shoulder muscles to decrease subluxation.  Used cyclic estim for 1 hour (45 minutes of which was unattended as it was passive stimulation).   Saebo Stim One 330 pulse width 35 Hz pulse rate On 8 sec/ off 8 sec Ramp up/ down 2 sec Symmetrical Biphasic wave form  Max intensity 148m at 500 Ohm load  (estim removed 45 minutes later and she tolerated it well with no adverse effects.)    During the last 10 minutes of her session, worked on B hip  stretches as pt c/o hip pain but pt stated the stretches aggravated the pain.  To take pressure off her sacrum, elevated legs on a pillow and elevated foot of bed.    Pt resting in bed with all needs met.      Therapy Documentation Precautions:  Precautions Precautions: Fall Precaution Comments: R hemicraniectomy- skull in R abdomen, L shoulder subluxation, L lateral lean and inattention, needs helmet for OOB, L weakness Restrictions Weight Bearing Restrictions: No  Pain: pt stated her hips were still "sore". unrated pain.  Worked on stretches but did not help her pain.  Nursing applied voltaren gel before session.   Therapy/Group: Individual Therapy  STift5/20/2021, 12:31 PM

## 2019-12-05 ENCOUNTER — Inpatient Hospital Stay (HOSPITAL_COMMUNITY): Payer: Medicare Other | Admitting: Physical Therapy

## 2019-12-05 ENCOUNTER — Inpatient Hospital Stay (HOSPITAL_COMMUNITY): Payer: Medicare Other | Admitting: Occupational Therapy

## 2019-12-05 LAB — BASIC METABOLIC PANEL
Anion gap: 11 (ref 5–15)
BUN: 12 mg/dL (ref 8–23)
CO2: 27 mmol/L (ref 22–32)
Calcium: 8.5 mg/dL — ABNORMAL LOW (ref 8.9–10.3)
Chloride: 100 mmol/L (ref 98–111)
Creatinine, Ser: 0.71 mg/dL (ref 0.44–1.00)
GFR calc Af Amer: >60 mL/min (ref >60)
GFR calc non Af Amer: >60 mL/min (ref >60)
Glucose, Bld: 250 mg/dL — ABNORMAL HIGH (ref 70–99)
Potassium: 3.2 mmol/L — ABNORMAL LOW (ref 3.5–5.1)
Sodium: 138 mmol/L (ref 135–145)

## 2019-12-05 LAB — GLUCOSE, CAPILLARY
Glucose-Capillary: 229 mg/dL — ABNORMAL HIGH (ref 70–99)
Glucose-Capillary: 243 mg/dL — ABNORMAL HIGH (ref 70–99)
Glucose-Capillary: 99 mg/dL (ref 70–99)
Glucose-Capillary: 131 mg/dL — ABNORMAL HIGH (ref 70–99)

## 2019-12-05 MED ORDER — INSULIN GLARGINE 100 UNIT/ML ~~LOC~~ SOLN
17.0000 [IU] | Freq: Two times a day (BID) | SUBCUTANEOUS | Status: DC
  Administered 2019-12-05 – 2019-12-12 (×14): 17 [IU] via SUBCUTANEOUS
  Filled 2019-12-05 (×15): qty 0.17

## 2019-12-05 MED ORDER — MELATONIN 3 MG PO TABS
3.0000 mg | ORAL_TABLET | Freq: Every day | ORAL | Status: DC
  Administered 2019-12-05 – 2019-12-13 (×9): 3 mg via ORAL
  Filled 2019-12-05 (×9): qty 1

## 2019-12-05 NOTE — Progress Notes (Signed)
Physical Therapy Session Note  Patient Details  Name: Krystal Little MRN: [id number] Date of Birth: 1956/01/13  Today's Date: 12/05/2019 PT Individual Time: 1101-1156 PT Individual Time Calculation (min): 55 min   Short Term Goals: Week 2:  PT Short Term Goal 1 (Week 2): Patient to be able to gait train 10f with LRAD and Mod assist PT Short Term Goal 2 (Week 2): Patient to be able to perform bed  <-> chair transfers with MinAx2, LRAD PT Short Term Goal 3 (Week 2): Patient to be able to maintain midline sitting and standing iwth no more than MinA/Min cues  Skilled Therapeutic Interventions/Progress Updates:    [facility] but willing to participate in therapy- very tired today and did not sleep well due to BMs last night. Able to come to standing with ModA and hemiwalker, and actually tolerated gait training approximately 17ftotal with hemiwalker, ModAx1/standby of second person for safety and chair follow! Needed lots of rest breaks this session as well as Mod-max cues for weight shifting and upright posture with standing and gait based activities. Also practiced stand-pivot transfer with hemiwalker with Mod-max cues for sequencing/weight shifting/upright posture with modAx1 and standby of second person for safety. Very fatigued and required +2 assist for sit to supine. Family educated on progress thus far however did educated that PT may be recommending a ramp for home entry for safety; husband emphatic that they expect her to be walking when she is discharged- PT educated that given progress so far, walking itself by time of DC is likely a reasonable goal but ramp may be safest way to enter home with family assisting patient, family agreeable to this education. Left in bed positioned to comfort with all needs met, bed alarm active and NT attending.   Therapy Documentation Precautions:  Precautions Precautions: Fall Precaution Comments: R hemicraniectomy- skull  in R abdomen, L shoulder subluxation, L lateral lean and inattention, needs helmet for OOB, L weakness Restrictions Weight Bearing Restrictions: No Pain: Pain Assessment Pain Scale: 0-10 Pain Score: 3  Pain Type: Acute pain Pain Location: Hip Pain Orientation: Left;Anterior Pain Descriptors / Indicators: Aching;Sore Pain Onset: On-going Patients Stated Pain Goal: 0 Pain Intervention(s): Ambulation/increased activity;Repositioned    Therapy/Group: Individual Therapy   KrWindell NorfolkDPT, PN1   Supplemental Physical Therapist [facility]  Pager [phone] [phone]  12/05/2019, 12:41 PM

## 2019-12-05 NOTE — Progress Notes (Signed)
Physical Therapy Session Note  Patient Details  Name: Krystal Little MRN: [id number] Date of Birth: 14-Jul-1956  Today's Date: 12/05/2019 PT Individual Time: 5176-1607 PT Individual Time Calculation (min): 44 min   Short Term Goals: Week 2:  PT Short Term Goal 1 (Week 2): Patient to be able to gait train 75ft with LRAD and Mod assist PT Short Term Goal 2 (Week 2): Patient to be able to perform bed  <-> chair transfers with MinAx2, LRAD PT Short Term Goal 3 (Week 2): Patient to be able to maintain midline sitting and standing iwth no more than MinA/Min cues  Skilled Therapeutic Interventions/Progress Updates: Pt presents sitting in bed w/ head elevated.  Pt is very fatigued, not sleeping well last night, but agreeable to participate in therapy.  Pt total assist to don shorts, socks and shoes in supine, although does lift LEs to place in pant legs.  Pt will flex knees and pull pants past knees w/ right hand.  Pt rolls side to side w/ cueing for hook-lying position and mod A to right and min A to left using side rails.  Pt was total A for pulling pants over hips.  Pt required mod A for right sidelying to sit.  Pt required min A sit to stand and mod to max A for step-pivot to w/c w/ blocking left knee to allow advancement of right foot.  Left knee weak, but not totally buckling w/ WB.  Pt wheeled to gym for energy and time conservation.  Pt performed sit to stand w/ mod A and blocking of L knee and mirror for input.  Pt stood w/ HW and decreased assist when at handrail in hallway.  Facilitation at left quads for increased extension.  Pt returned to room and remained in w/c w/ chair alarm on and needs in reach.  Daughter present in room.      Therapy Documentation Precautions:  Precautions Precautions: Fall Precaution Comments: R hemicraniectomy- skull in R abdomen, L shoulder subluxation, L lateral lean and inattention, needs helmet for OOB, L weakness Restrictions Weight Bearing Restrictions:  No General:   Vital Signs: Therapy Vitals Temp: 98.1 F (36.7 C) Pulse Rate: 63 Resp: 18 BP: 131/67 Patient Position (if appropriate): Lying Oxygen Therapy SpO2: 95 % O2 Device: Room Air Pain:  pt c/o soreness left hip initially, but no further.    Therapy/Group: Individual Therapy  Ladoris Gene 12/05/2019, 9:39 AM

## 2019-12-05 NOTE — Progress Notes (Signed)
Speech Language Pathology Weekly Progress Note  Patient Details  Name: Krystal Little MRN: [id number] Date of Birth: 12-26-1955  Beginning of progress report period: Nov 28, 2019 End of progress report period: Dec 05, 2019  Short Term Goals: Week 2: SLP Short Term Goal 1 (Week 2): Pt will consume therapeutic trials of thin liquids with minimal overt s/s of aspiration and supervision cues for use of swallowing precautions over 3 consecutive sessions. SLP Short Term Goal 1 - Progress (Week 2): Met SLP Short Term Goal 2 (Week 2): Pt will consume therapeutic trials of dys 2 textures with min cues to clear solids from the oral cavity and minimal overt s/s of aspiration over 3 consecutive sessions prior to advancement. SLP Short Term Goal 2 - Progress (Week 2): Met SLP Short Term Goal 3 (Week 2): Pt will locate items needed for functional tasks at midline in >50% of opportunities with mod assist multimodal cues. SLP Short Term Goal 3 - Progress (Week 2): Met SLP Short Term Goal 4 (Week 2): Pt will sustain her attention to basic, familiar tasks for 7 minute intervals with mod cues needed for redirection. SLP Short Term Goal 4 - Progress (Week 2): Met SLP Short Term Goal 5 (Week 2): Pt will complete basic, familiar tasks with mod assist multimodal cues for functional problem solving SLP Short Term Goal 5 - Progress (Week 2): Met SLP Short Term Goal 6 (Week 2): Pt will utilize an increased vocal intensity and slow rate of speech to achieve intelligibility at the phrase/short sentence level with min assist verbal cues. SLP Short Term Goal 6 - Progress (Week 2): Met    New Short Term Goals: Week 3: SLP Short Term Goal 1 (Week 3): Patient will consume current diet with minimal overt s/s of aspiration with supervision level verbal cues for use of swallowing compensatory strategies. SLP Short Term Goal 2 (Week 3): Pt will consume therapeutic trials of dys 3 textures with min cues to clear solids from the  oral cavity and minimal overt s/s of aspiration over 3 consecutive sessions prior to advancement. SLP Short Term Goal 3 (Week 3): Pt will locate items needed for functional tasks at midline in >75% of opportunities with min assist multimodal cues. SLP Short Term Goal 4 (Week 3): Pt will sustain her attention to basic, familiar tasks for 15 minute intervals with min cues needed for redirection. SLP Short Term Goal 5 (Week 3): Pt will complete basic, familiar tasks with min assist multimodal cues for functional problem solving SLP Short Term Goal 6 (Week 3): Pt will utilize an increased vocal intensity and slow rate of speech to achieve intelligibility at the phrase/short sentence level with supervision assist verbal cues to achieve 90% intelligibility.  Weekly Progress Updates: Patient has made excellent gains and has met 6 of 6 STGs this reporting period. Currently, patient is consuming Dys. 2 textures with thin liquids with minimal overt s/s of aspiration and Min verbal cues for use of swallowing compensatory strategies. Patient demonstrates improved speech intelligibility and is ~75% intelligible at the phrase/short sentence level with overall Min verbal cues for use of compensatory strategies. Patient also requires overall Mod A verbal cues to complete functional and familiar tasks safely in regards to sustained attention, visual scanning and basic problem solving. Patient and family education is ongoing. Patient would benefit from continued skilled SLP intervention to maximize her cognitive, swallowing and speech function prior to discharge.      Intensity: Minumum of 1-2 x/day, 30  to 90 minutes Frequency: 3 to 5 out of 7 days Duration/Length of Stay: 12/18/19 Treatment/Interventions: Cognitive remediation/compensation;Cueing hierarchy;Patient/family education;Environmental controls;Dysphagia/aspiration precaution training;Internal/external aids;Speech/Language facilitation;Functional  tasks;Therapeutic Activities    [place], [place] 12/05/2019, 7:04 AM

## 2019-12-05 NOTE — Progress Notes (Signed)
Occupational Therapy Session Note  Patient Details  Name: Krystal Little MRN: [id number] Date of Birth: Aug 03, 1955  Today's Date: 12/05/2019 OT Individual Time: 1400-1445 OT Individual Time Calculation (min): 45 min    Short Term Goals: Week 2:  OT Short Term Goal 1 (Week 2): Pt will be able to sit at EOB with mod A of 1 to prepare for transfers. OT Short Term Goal 2 (Week 2): Pt will be able to sit to stand at sink with mod A of 1. OT Short Term Goal 3 (Week 2): Pt will don shirt with mod A. OT Short Term Goal 4 (Week 2): Pt will complete toilet transfers with max A.  Skilled Therapeutic Interventions/Progress Updates:    Pt received in bed sleeping but woke easily.  She is quite tired but agreeable to therapy.  Pt taken to therapy gym for NMR.   ADL Retraining: From bed level, donned shorts with max A using rolling and partial bridging. Pt used R hand to partially pull up pants. Transfers: Bed >< w/c with sliding board mod-max A of 2 Balance: Standing max , sitting EOB CGA static, dynamic sit mod A Neuromuscular Re-Education: Sitting at table in gym, hand over hand facilitation with sliding arm back and forth on table with trace movement felt in shoulder.   Therapeutic Activity/ Exercise: Sitting at table in gym, worked on L visual scanning by completing a peg puzzle with min-mod A on pieces in her R visual field fading to mod-max cues on L visual field,  She worked on first half of puzzle standing for 3 minutes with max A as she was leaning to her L and needed total A to maintain L knee extension   Pt very tired during session and not able to put in her full effort.  Returned to room and resting in bed    Therapy Documentation Precautions:  Precautions Precautions: Fall Precaution Comments: R hemicraniectomy- skull in R abdomen, L shoulder subluxation, L lateral lean and inattention, needs helmet for OOB, L weakness Restrictions Weight Bearing Restrictions: No    Vital  Signs: Therapy Vitals Temp: 97.7 F (36.5 C) Temp Source: Oral Pulse Rate: 60 Resp: 20 BP: (!) 143/60 Patient Position (if appropriate): Lying Oxygen Therapy SpO2: 98 % O2 Device: Room Air Pain: Pain Assessment Pain Scale: 0-10 Pain Score: 3  Pain Type: Acute pain Pain Location: Hip Pain Orientation: Left;Anterior Pain Descriptors / Indicators: Aching;Sore Pain Onset: On-going Patients Stated Pain Goal: 0 Pain Intervention(s): Ambulation/increased activity;Repositioned   Therapy/Group: Individual Therapy  [place] 12/05/2019, 2:56 PM

## 2019-12-05 NOTE — Progress Notes (Signed)
Physical Therapy Session Note  Patient Details  Name: Krystal Little MRN: [id number] Date of Birth: 06-21-1956  Today's Date: 12/05/2019 PT Individual Time: 2094-7096 PT Individual Time Calculation (min): 45 min   Short Term Goals: Week 2:  PT Short Term Goal 1 (Week 2): Patient to be able to gait train 35f with LRAD and Mod assist PT Short Term Goal 2 (Week 2): Patient to be able to perform bed  <-> chair transfers with MinAx2, LRAD PT Short Term Goal 3 (Week 2): Patient to be able to maintain midline sitting and standing iwth no more than MinA/Min cues  Skilled Therapeutic Interventions/Progress Updates:    Patient received in bed, very fatigued and somnolent- spent first part of session educating husband on stretches and exercises they can do in the bed for her L LE including gastroc stretching, heel slides, supine hip abduction, SAQs, and ankle pumps with husband demonstrating correct hand placement and technique for all. Patient then requested to go to the bathroom- needed MinA to roll to the L and MaxA to roll to the R today, sheets were soiled and so had to roll multiple times for changing of brief and bedding with  Assist of nursing staff. Left in bed with all needs met, bed alarm active and husband present this afternoon.   Therapy Documentation Precautions:  Precautions Precautions: Fall Precaution Comments: R hemicraniectomy- skull in R abdomen, L shoulder subluxation, L lateral lean and inattention, needs helmet for OOB, L weakness Restrictions Weight Bearing Restrictions: No  Pain Assessment Pain Scale: 0-10 Pain Score: 3  Pain Type: Acute pain Pain Location: Hip Pain Orientation: Left;Anterior Pain Descriptors / Indicators: Aching;Sore Pain Onset: On-going Patients Stated Pain Goal: 0 Pain Intervention(s): Repositioned Multiple Pain Sites: No   15 minutes of therapy time lost due to fatigue.     Therapy/Group: Individual Therapy   KWindell [place] DPT, PN1    Supplemental Physical Therapist [place]   Pager [phone] Rehab Office [phone]   12/05/2019, 3:41 PM

## 2019-12-05 NOTE — Progress Notes (Signed)
Pt continues to have multiple type 6 BMs. Pt denies abd pain. Pt had 3 episodes of incontinence. Pt unsure what is causing the loose stool.Provider will be made aware during morning rounds.

## 2019-12-05 NOTE — Progress Notes (Signed)
[facility] PHYSICAL MEDICINE & REHABILITATION PROGRESS NOTE  Subjective/Complaints: Slept poorly overnight; agreeable to starting melatonin Denies pain, constipation. Vitals stable.   ROS: Denies CP, SOB, N/V/D  Objective: Vital Signs: Blood pressure 131/67, pulse 63, temperature 98.1 F (36.7 C), resp. rate 18, height 5\' 4"  (1.626 m), weight 63.9 kg, SpO2 95 %. DG Swallowing Func-Speech Pathology  Result Date: 12/03/2019 Objective Swallowing Evaluation: Type of Study: MBS-Modified Barium Swallow Study  Patient Details Name: Krystal Little MRN: [id number] Date of Birth: 10/07/1955 Today's Date: 12/03/2019 Past Medical History: Past Medical History: Diagnosis Date . Chronic atrial fibrillation ([place]) 1990s  s/p DCCV then attempted ablation of complex A Flutter (@ [facility] - Dr. Deno Etienne), failed antiarrhythmics --> INR folllwed @ [facility] ->> now status post pacemaker placement with underlying A. fib. . Diabetes mellitus type 2, uncontrolled, without complications   On Oral Medications ([facility]) . Essential hypertension  . History of cardiac catheterization 2001  R&LHC - normal Coronaries, no evidence of Restictive Cardiomyopathy or Constrictive Pericarditis (also @ [facility]) . Hx of sick sinus syndrome 07/1999  Wtih symptomatic bradycardia - syncope ([place]) . 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 CARDIAC CATHETERIZATION  08/03/1999  [place] - normal Coronaries; no sign of constriction or restrictive cardiomypathy . CRANIECTOMY FOR DEPRESSED SKULL FRACTURE Right 11/02/2019   Procedure: DECOMPRESSIVE HEMI -CRANIECTOMY, IMPLANTATION OF SKULL FLAP TO RIGHT ABDOMEN;  Surgeon: Consuella Lose, MD;  Location: [place];  Service: Neurosurgery;  Laterality: Right; . IR CT HEAD LTD  10/31/2019 . IR PERCUTANEOUS ART THROMBECTOMY/INFUSION INTRACRANIAL INC DIAG ANGIO  10/31/2019    . IR PERCUTANEOUS ART THROMBECTOMY/INFUSION INTRACRANIAL INC DIAG ANGIO  10/31/2019 . MITRAL VALVE REPAIR  07/1999  Both Ant& Post leaflet repair - quadrangular resection&caudal transposition, #32 Sequin Annuloplasty ring . PACEMAKER GENERATOR CHANGE  11/26/2008  Medtronic Adapta L(? if it has been checked since 02/2013) . PACEMAKER INSERTION  07/1999  for SSS in setting of Chronic Afib; Medtronic Kappa [id number], [id number] H. Marland Kitchen RADIOLOGY WITH ANESTHESIA N/A 10/31/2019  Procedure: IR WITH ANESTHESIA;  Surgeon: Radiologist, Medication, MD;  Location: [place];  Service: Radiology;  Laterality: N/A; . TRANSTHORACIC ECHOCARDIOGRAM  05/2018  Normal LV size and thickness.  EF estimated 50%.  Inferobasal HK and flattened septum c/w with elevated PA pressures. Mild functional mitral stenosis at rest (postoperative).  Moderate left atrial dilation and mild right atrial dilation.  Mean PA pressures estimated 52 mmHg. HPI: 64 y.o. female presented [facility] ED as a code stroke for R gaze deviation and L-sided weakness. CT revealed a large R MCA infarct. Pt s/p R hemicraniectomy with bone flap placed in abdomen on 4/18. ETT 4/18-4/25. PMH HTN, DM2, chronic a. Fib (coumadin) s/p pace maker. Pt admitted to [facility] 11/20/19.  Assessment / Plan / Recommendation CHL IP CLINICAL IMPRESSIONS 12/03/2019 Clinical Impression Pt continues to present with mild pharyngeal and moderate oral dysphagia, however improved ability to protect airway in comparison to previous MBSS. No aspiration events were observed throughout today's study. Although thin barium pools in the pyriform sinuses prior to swallow initiation, only 1 instance of  trace penetration was noted.  Penetration above the level of the vocal folds also observed with dual consistency PO. Also of note, pt has been tolerating free water protocol without clinical signs of respiratory distress and very minimal s/sx aspiration. Pt's oral phase impairments include decreased bolus cohesion, reduced AP transit of advanced solids and dual consistencies, and anterior loss. Pt's mastication was more efficient and more complete oral clearance achieved with Dysphagia 2 (minced) textures in comparison to Dysphagia 3 (mech soft). Pt ultimately expectorated barium pill after 2 unsuccessful attempts to deglutate pill with thin barium due to decreased bolus cohesion and AP transit. Would recommend pt upgrade to Dysphagia 2 (minced/ground) solids and thin liquids, avoid dual consistency POs (ex: cereal, fruit cocktail, etc.), and continue medications crushed in puree. Full supervision should still be provided during meals. Encourage use of straw and self feeding. SLP Visit Diagnosis Dysphagia, oropharyngeal phase (R13.12) Attention and concentration deficit following -- Frontal lobe and executive function deficit following -- Impact on safety and function Mild aspiration risk   CHL IP TREATMENT RECOMMENDATION 11/14/2019 Treatment Recommendations Therapy as outlined in treatment plan below   Prognosis 11/14/2019 Prognosis for Safe Diet Advancement Good Barriers to Reach Goals -- Barriers/Prognosis Comment -- CHL IP DIET RECOMMENDATION 12/03/2019 SLP Diet Recommendations Dysphagia 2 (Fine chop) solids;Thin liquid Liquid Administration via Straw;Cup Medication Administration Crushed with puree Compensations Slow rate;Small sips/bites;Minimize environmental distractions Postural Changes Remain semi-upright after after feeds/meals (Comment);Seated upright at 90 degrees   CHL IP OTHER RECOMMENDATIONS 12/03/2019 Recommended Consults -- Oral Care Recommendations Oral care BID Other Recommendations Have oral suction available   CHL IP FOLLOW UP  RECOMMENDATIONS 11/19/2019 Follow up Recommendations Inpatient Rehab   CHL IP FREQUENCY AND DURATION 11/14/2019 Speech Therapy Frequency (ACUTE ONLY) min 2x/week Treatment Duration 2 weeks      CHL IP ORAL PHASE 12/03/2019 Oral Phase Impaired Oral - Pudding Teaspoon -- Oral - Pudding Cup -- Oral - Honey Teaspoon -- Oral - Honey Cup -- Oral - Nectar Teaspoon NT Oral - Nectar Cup NT Oral - Nectar Straw NT Oral - Thin Teaspoon NT Oral - Thin Cup Lingual/palatal residue;Piecemeal swallowing;Delayed oral transit;Premature spillage Oral - Thin Straw Premature spillage;Piecemeal swallowing Oral - Puree NT Oral - Mech Soft Delayed oral transit;Decreased bolus cohesion;Impaired mastication;Weak lingual manipulation;Lingual pumping Oral - Regular -- Oral - Multi-Consistency Piecemeal swallowing;Decreased bolus cohesion Oral - Pill Reduced posterior propulsion;Lingual pumping Oral Phase - Comment --  CHL IP PHARYNGEAL PHASE 12/03/2019 Pharyngeal Phase Impaired Pharyngeal- Pudding Teaspoon -- Pharyngeal -- Pharyngeal- Pudding Cup -- Pharyngeal -- Pharyngeal- Honey Teaspoon -- Pharyngeal -- Pharyngeal- Honey Cup -- Pharyngeal -- Pharyngeal- Nectar Teaspoon NT Pharyngeal -- Pharyngeal- Nectar Cup -- Pharyngeal -- Pharyngeal- Nectar Straw NT Pharyngeal -- Pharyngeal- Thin Teaspoon NT Pharyngeal -- Pharyngeal- Thin Cup -- Pharyngeal -- Pharyngeal- Thin Straw Delayed swallow initiation-pyriform sinuses;Penetration/Aspiration before swallow Pharyngeal Material enters airway, CONTACTS cords and then ejected out Pharyngeal- Puree NT Pharyngeal -- Pharyngeal- Mechanical Soft Delayed swallow initiation-vallecula Pharyngeal -- Pharyngeal- Regular -- Pharyngeal -- Pharyngeal- Multi-consistency Delayed swallow initiation-vallecula;Penetration/Aspiration during swallow Pharyngeal Material enters airway, remains ABOVE vocal cords then ejected out Pharyngeal- Pill Other (Comment) Pharyngeal -- Pharyngeal Comment --  CHL IP CERVICAL ESOPHAGEAL PHASE  12/03/2019 Cervical Esophageal Phase WFL Pudding Teaspoon -- Pudding Cup -- Honey Teaspoon -- Honey Cup -- Nectar Teaspoon -- Nectar Cup -- Nectar Straw -- Thin Teaspoon -- Thin Cup -- Thin Straw -- Puree -- Mechanical Soft -- Regular -- Multi-consistency -- Pill -- Cervical Esophageal Comment --  [name] 12/03/2019, 9:50 AM              No results for input(s): WBC, HGB, HCT, PLT in the last 72 hours. No results for input(s): NA, K, CL, CO2, GLUCOSE, BUN, CREATININE, CALCIUM in the last 72 hours.  Physical Exam: BP 131/67 (BP Location: Right Arm)   Pulse 63   Temp 98.1 F (36.7 C)   Resp 18   Ht 5\' 4"  (1.626 m)   Wt 63.9 kg   SpO2 95%   BMI 24.18 kg/m   Constitutional: No distress . Vital signs reviewed. Sitting up in bed comfortably.  HENT: Right craniectomy C/D/I Eyes: Right gaze preference.  No discharge. Cardiovascular: No JVD. Respiratory: Normal effort.  No stridor. GI: Non-distended. Bone flap to right lower quadrant C/D/I Skin: Warm and dry.  Intact. Psych: Flat. Musc: No edema in extremities.  No tenderness in extremities. Neuro: Alert Motor: RUE: 4/5 proximal distal RLE: 4/5 proximal distal LUE: 0/5 proximal distal, persistent LLE: Hip flexion, knee extension, ankle dorsiflexion 2/5, improving Left facial weakness Dysarthria, stable  Assessment/Plan: 1. 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  Physiatrist is providing close team supervision and 24 hour management of active medical problems listed below.  Physiatrist and rehab team continue to assess barriers to discharge/monitor patient progress toward functional and medical goals  Care Tool:  Bathing    Body parts bathed by patient: Chest, Face, Left arm,  Abdomen, Front perineal area, Right upper leg   Body parts bathed by helper: Buttocks, Right arm, Left upper leg, Right lower leg, Left lower leg     Bathing assist Assist Level: Maximal Assistance - Patient 24 - 49%     Upper Body Dressing/Undressing Upper body dressing   What is the patient wearing?: Hospital gown only    Upper body assist Assist Level: Maximal Assistance - Patient 25 - 49%    Lower Body Dressing/Undressing Lower body dressing      What is the patient wearing?: Incontinence brief     Lower body assist Assist for lower body dressing: Total Assistance - Patient < 25%     Toileting Toileting    Toileting assist Assist for toileting: 2 Helpers     Transfers Chair/bed transfer  Transfers assist     Chair/bed transfer assist level: 2 Helpers     Locomotion Ambulation   Ambulation assist   Ambulation activity did not occur: Safety/medical concerns  Assist level: 2 helpers Assistive device: Other (comment)(rail in hallway) Max distance: 40ft   Walk 10 feet activity   Assist  Walk 10 feet activity did not occur: Safety/medical concerns  Assist level: 2 helpers Assistive device: Other (comment)(3 muskateer assist)   Walk 50 feet activity   Assist Walk 50 feet with 2 turns activity did not occur: Safety/medical concerns         Walk 150 feet activity   Assist Walk 150 feet activity did not occur: Safety/medical concerns(lethargy)         Walk 10 feet on uneven surface  activity   Assist Walk 10 feet on uneven surfaces activity did not occur: Safety/medical concerns(lethargy)         Wheelchair     Assist Will patient use wheelchair at discharge?: Yes Type of Wheelchair: (TBD)    Wheelchair assist level:  Dependent - Patient 0%      Wheelchair 50 feet with 2 turns activity    Assist        Assist Level: Dependent - Patient 0%   Wheelchair 150 feet activity     Assist     Assist Level:  Dependent - Patient 0%    Medical Problem List and Plan: 1. 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    Repeat head CT personally reviewed, and reviewed with patient, showing some improvement  Helmet for safety when OOB.  WHO/PRAFO nightly  Communicated with therapies, shoulder sling ordered.  Continue CIR 2.  Antithrombotics:  -DVT/anticoagulation:    Discussed with neurology, continue Eliquis             -antiplatelet therapy: Aspirin 325 mg daily initiated 11/17/2019 3. Pain Management: Tylenol as needed. Well controlled 4. Mood: Amantadine 100 mg twice daily             -antipsychotic agents: N/A 5. Neuropsych: This patient is not fully capable of making decisions on her own behalf. 6. Skin/Wound Care: Routine skin checks  5/16: Gerhardt's butt cream ordered for anal fissure.  7. Fluids/Electrolytes/Nutrition: Routine in and outs.    BMP within acceptable range, except for glucose on 5/10, labs ordered for tomorrow 8. Atrial fibrillation/pacemaker.    Failed Coumadin, started Eliquis  Cardiac rate controlled.    Continue Cardizem 60 mg every 6 hours as well as Lopressor 25 mg twice daily.    Follow-up cardiology services as needed 9. Acute blood loss anemia.    Hemoglobin 11.6 on 5/18   Continue to monitor 10. Uncontrolled diabetes mellitus type 2 with hyperglycemia.  Hemoglobin A1c 9.7.    NovoLog 8 units BID with breakfast and lunch  Lantus insulin 21 units twice daily, decreased to 16 twice daily on 5/14, increased to 18 twice daily on 5/16, decreased to 17 twice daily on 5/18, decreased back to 16 twice daily on 5/20  Check blood sugars before meals and at bedtime.  Labile on 5/20, will  consider increase of mealtime coverage in the next couple days  5/21: Sugars elevated. Increase Lantus to 17U BID.  11. Post stroke dysphagia.    Advance diet to D2 thins   Continue to advance as tolerated 12. Hyperlipidemia.  Lipitor 13. Essential HTN  See #8  Started lisinopril 2.5 daily on 5/16  Appears to be relatively controlled on 5/20, vital signs?  Pending 14. Insomnia: started melatonin 3mg  HS 15. MEWS 1: monitor vitals closely   LOS: 15 days A FACE TO FACE EVALUATION WAS PERFORMED  Clide Deutscher Jaesean Litzau 12/05/2019, 8:44 AM

## 2019-12-06 ENCOUNTER — Inpatient Hospital Stay (HOSPITAL_COMMUNITY): Payer: Medicare Other

## 2019-12-06 LAB — GLUCOSE, CAPILLARY
Glucose-Capillary: 119 mg/dL — ABNORMAL HIGH (ref 70–99)
Glucose-Capillary: 272 mg/dL — ABNORMAL HIGH (ref 70–99)
Glucose-Capillary: 344 mg/dL — ABNORMAL HIGH (ref 70–99)
Glucose-Capillary: 320 mg/dL — ABNORMAL HIGH (ref 70–99)

## 2019-12-06 MED ORDER — POTASSIUM CHLORIDE CRYS ER 20 MEQ PO TBCR
40.0000 meq | EXTENDED_RELEASE_TABLET | Freq: Two times a day (BID) | ORAL | Status: AC
  Administered 2019-12-06 – 2019-12-07 (×2): 40 meq via ORAL
  Filled 2019-12-06 (×2): qty 2

## 2019-12-06 NOTE — Progress Notes (Signed)
High blood sugar reported to MD, no new orders given but monitor at bedtime CBG check.

## 2019-12-06 NOTE — Progress Notes (Signed)
[facility] PHYSICAL MEDICINE & REHABILITATION PROGRESS NOTE  Subjective/Complaints:  Pt reports bowels OK- slept better last night per pt and daughter.  Pt heading to therapy with PT.    ROS:  P.mlr    Objective: Vital Signs: Blood pressure 138/66, pulse 62, temperature (!) 97.5 F (36.4 C), temperature source Oral, resp. rate 17, height 5\' 4"  (1.626 m), weight 63.2 kg, SpO2 98 %. No results found. No results for input(s): WBC, HGB, HCT, PLT in the last 72 hours. Recent Labs    12/05/19 0757  NA 138  K 3.2*  CL 100  CO2 27  GLUCOSE 250*  BUN 12  CREATININE 0.71  CALCIUM 8.5*    Physical Exam: BP 138/66 (BP Location: Right Arm)   Pulse 62   Temp (!) 97.5 F (36.4 C) (Oral)   Resp 17   Ht 5\' 4"  (1.626 m)   Wt 63.2 kg   SpO2 98%   BMI 23.92 kg/m   Constitutional: No distress . Vital signs reviewed. Laying in bed- a little more appropriate, no spontaneous speech; daughter at bedside, NAD HENT: Right craniectomy C/D/I- no change- missing bone flap- in abd Eyes: Right gaze preference.  - no change Cardiovascular: RRR Respiratory: CTA B/L GI: soft, NT< ND, (+)BS Bone flap to right lower quadrant C/D/I- sutures out Skin: Warm and dry.  Intact. Psych: Flat. Musc: No edema in extremities.  No tenderness in extremities. Neuro: Alert Motor: RUE: 4/5 proximal distal RLE: 4/5 proximal distal LUE: 0/5 proximal distal, persistent LLE: Hip flexion, knee extension, ankle dorsiflexion 2/5, improving Left facial weakness- still notable Dysarthria, slightly improved from last time I saw pt  Assessment/Plan: 1. 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  Physiatrist is providing close team supervision and 24 hour management of  active medical problems listed below.  Physiatrist and rehab team continue to assess barriers to discharge/monitor patient progress toward functional and medical goals  Care Tool:  Bathing    Body parts bathed by patient: Chest, Face, Left arm, Abdomen, Front perineal area, Right upper leg   Body parts bathed by helper: Buttocks, Right arm, Left upper leg, Right lower leg, Left lower leg     Bathing assist Assist Level: Maximal Assistance - Patient 24 - 49%     Upper Body Dressing/Undressing Upper body dressing   What is the patient wearing?: Hospital gown only    Upper body assist Assist Level: Maximal Assistance - Patient 25 - 49%    Lower Body Dressing/Undressing Lower body dressing      What is the patient wearing?: Pants, Incontinence brief     Lower body assist Assist for lower body dressing: Maximal Assistance - Patient 25 - 49%     Toileting Toileting    Toileting assist Assist for toileting: 2 Helpers     Transfers Chair/bed transfer  Transfers assist     Chair/bed transfer assist level: 2 Helpers(slide board)     Locomotion Ambulation   Ambulation assist   Ambulation activity did not occur: Safety/medical concerns  Assist level: 2 helpers Assistive device: Other (comment)(hemiwalker) Max distance: 8ft   Walk 10 feet activity   Assist  Walk 10 feet activity did not occur: Safety/medical concerns  Assist level: 2 helpers Assistive device: Walker-hemi   Walk 50 feet activity   Assist Walk 50  feet with 2 turns activity did not occur: Safety/medical concerns         Walk 150 feet activity   Assist Walk 150 feet activity did not occur: Safety/medical concerns(lethargy)         Walk 10 feet on uneven surface  activity   Assist Walk 10 feet on uneven surfaces activity did not occur: Safety/medical concerns(lethargy)         Wheelchair     Assist Will patient use wheelchair at discharge?: Yes Type of Wheelchair:  (TBD)    Wheelchair assist level: Dependent - Patient 0%      Wheelchair 50 feet with 2 turns activity    Assist        Assist Level: Dependent - Patient 0%   Wheelchair 150 feet activity     Assist     Assist Level: Dependent - Patient 0%    Medical Problem List and Plan: 1. 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    Repeat head CT personally reviewed, and reviewed with patient, showing some improvement  Helmet for safety when OOB.  WHO/PRAFO nightly  Communicated with therapies, shoulder sling ordered.  Continue CIR 2.  Antithrombotics:  -DVT/anticoagulation:    Discussed with neurology, continue Eliquis             -antiplatelet therapy: Aspirin 325 mg daily initiated 11/17/2019 3. Pain Management: Tylenol as needed. Well controlled 4. Mood: Amantadine 100 mg twice daily             -antipsychotic agents: N/A 5. Neuropsych: This patient is not fully capable of making decisions on her own behalf. 6. Skin/Wound Care: Routine skin checks  5/16: Gerhardt's butt cream ordered for anal fissure.  7. Fluids/Electrolytes/Nutrition: Routine in and outs.    BMP within acceptable range, except for glucose on 5/10, labs ordered for tomorrow  5/22- K+ 3.2- will replete and recheck Monday  8. Atrial fibrillation/pacemaker.    Failed Coumadin, started Eliquis  Cardiac rate controlled.    Continue Cardizem 60 mg every 6 hours as well as Lopressor 25 mg twice daily.    Follow-up cardiology services as needed 9. Acute blood loss anemia.    Hemoglobin 11.6 on 5/18   Continue to monitor 10. Uncontrolled diabetes mellitus type 2 with hyperglycemia.  Hemoglobin A1c 9.7.    NovoLog 8 units BID  with breakfast and lunch  Lantus insulin 21 units twice daily, decreased to 16 twice daily on 5/14, increased to 18 twice daily on 5/16, decreased to 17 twice daily on 5/18, decreased back to 16 twice daily on 5/20  Check blood sugars before meals and at bedtime.  Labile on 5/20, will consider increase of mealtime coverage in the next couple days  5/21: Sugars elevated. Increase Lantus to 17U BID.   CBG (last 3)  Recent Labs    12/05/19 2049 12/06/19 0622 12/06/19 1200  GLUCAP 131* 119* 272*    5/22- better since Insulin increased  11. Post stroke dysphagia.    Advance diet to D2 thins   Continue to advance as tolerated 12. Hyperlipidemia.  Lipitor 13. Essential HTN  See #8  Started lisinopril 2.5 daily on 5/16  Appears to be relatively controlled on 5/20, vital signs?  Pending 14. Insomnia: started melatonin 3mg  HS  5/22- slept better  last night.  15. MEWS 1: monitor vitals closely   LOS: 16 days A FACE TO FACE EVALUATION WAS PERFORMED  Megan Lovorn 12/06/2019, 4:22 PM

## 2019-12-06 NOTE — Progress Notes (Signed)
Occupational Therapy Session Note  Patient Details  Name: Krystal Little MRN: [id number] Date of Birth: 04/11/1956  Today's Date: 12/06/2019 OT Individual Time: 1100-1158 OT Individual Time Calculation (min): 58 min    Short Term Goals: Week 1:  OT Short Term Goal 1 (Week 1): Pt will be able to actively turn her head to the left with min Cues. OT Short Term Goal 1 - Progress (Week 1): Met OT Short Term Goal 2 (Week 1): Pt will be able to wash her L arm with mod A. OT Short Term Goal 2 - Progress (Week 1): Met OT Short Term Goal 3 (Week 1): Pt will be able to sit to EOB with mod A of 1. OT Short Term Goal 3 - Progress (Week 1): Progressing toward goal OT Short Term Goal 4 (Week 1): Pt will be able to rise to stand in stedy lift with max A of 1. OT Short Term Goal 4 - Progress (Week 1): Progressing toward goal OT Short Term Goal 5 (Week 1): Pt will be able to self feed with min A. OT Short Term Goal 5 - Progress (Week 1): Met  Skilled Therapeutic Interventions/Progress Updates:    1:1. Pt received in bed agreeable to OT. Pt with no pain reported. OT dons pants total A rolling in B directions with min-MAX A overall to advance pants past hips. Pt supine>sitting EOB with A for BLE management and VC for sequencing. Pt able to doff sock in seated figure 4 at EOB with MOD A for sitting balance and A to hold LE in position. tp requires min A to don sock. Pt dons R shoe and requires A for L and AFO. Pt completes lateral scoot transfer with MOD A overall to R with VC for head hips relationship as well as hand placement. Pt completes grooming at isnk with pt scanning L for needed items and OT instructs in 1 handed technique to have sink hold toothbrush and HOH A for bimanual opening of toothpaste. Pt requires cuing for termination at sink. Pt completes 4 sit to stands with focus on hand placement, active knee extension on L, lateral weight shifting and standing unsupported at midline iwht mirror for  visual feedback. Exited session wih tpt seated in w/c, with exit alarm on and call light ti reach  Therapy Documentation Precautions:  Precautions Precautions: Fall Precaution Comments: R hemicraniectomy- skull in R abdomen, L shoulder subluxation, L lateral lean and inattention, needs helmet for OOB, L weakness Restrictions Weight Bearing Restrictions: No General:   Vital Signs: Therapy Vitals Pulse Rate: 60 BP: (!) 134/56 Pain: Pain Assessment Pain Scale: 0-10 Pain Score: 0-No pain ADL:   Vision   Perception    Praxis   Exercises:   Other Treatments:     Therapy/Group: Individual Therapy  Tonny Branch 12/06/2019, 12:01 PM

## 2019-12-07 ENCOUNTER — Inpatient Hospital Stay (HOSPITAL_COMMUNITY): Payer: Medicare Other

## 2019-12-07 LAB — GLUCOSE, CAPILLARY
Glucose-Capillary: 121 mg/dL — ABNORMAL HIGH (ref 70–99)
Glucose-Capillary: 106 mg/dL — ABNORMAL HIGH (ref 70–99)
Glucose-Capillary: 75 mg/dL (ref 70–99)
Glucose-Capillary: 226 mg/dL — ABNORMAL HIGH (ref 70–99)

## 2019-12-07 NOTE — Progress Notes (Signed)
PHYSICAL MEDICINE & REHABILITATION PROGRESS NOTE  Subjective/Complaints:  Nursing reports pt's daughter wants to wash her hair- they were concerned due to lack of bone flap- explained multiple pts get haired washed at home when waiting to get bone flap put on- it's OK as long as they are careful and don't push/prod directly on brain/where skull is missing.     ROS:   Pt denies SOB, abd pain, CP, N/V/C/D, and vision changes    Objective: Vital Signs: Blood pressure 138/67, pulse 64, temperature 98.8 F (37.1 C), temperature source Oral, resp. rate 18, height 5\' 4"  (1.626 m), weight 63.2 kg, SpO2 96 %. No results found. No results for input(s): WBC, HGB, HCT, PLT in the last 72 hours. Recent Labs    12/05/19 0757  NA 138  K 3.2*  CL 100  CO2 27  GLUCOSE 250*  BUN 12  CREATININE 0.71  CALCIUM 8.5*    Physical Exam: BP 138/67 (BP Location: Right Arm)   Pulse 64   Temp 98.8 F (37.1 C) (Oral)   Resp 18   Ht 5\' 4"  (1.626 m)   Wt 63.2 kg   SpO2 96%   BMI 23.92 kg/m   Constitutional: pt supine, sleepy, but just finished ~25% of breakfast, NAD HENT: Right craniectomy C/D/I- no change-missing R bone flap- in abd Eyes: Right gaze preference. No change since yesterday Cardiovascular: RRR Respiratory: CTA B/L- no W/R/R- good air movement GI: Soft, NT, ND, (+)BS  Bone flap to right lower quadrant C/D/I- sutures out- stable Skin: Warm and dry.  Intact. Psych: flat affect Musc: No edema in extremities.  No tenderness in extremities. Neuro: Alert Motor: RUE: 4/5 proximal distal RLE: 4/5 proximal distal LUE: 0/5 proximal distal, persistent LLE: Hip flexion, knee extension, ankle dorsiflexion 2/5, improving Left facial weakness- still notable Dysarthria, slightly improved from last time I saw pt  Assessment/Plan: 1. Functional deficits secondary to large right MCA infarct due to right ICA occlusion status post revascularization/endovascular thrombectomy status  post decompressive right hemicraniectomy which require 3+ hours per day of interdisciplinary therapy in a comprehensive inpatient rehab setting.  Physiatrist is providing close team supervision and 24 hour management of active medical problems listed below.  Physiatrist and rehab team continue to assess barriers to discharge/monitor patient progress toward functional and medical goals  Care Tool:  Bathing    Body parts bathed by patient: Chest, Face, Left arm, Abdomen, Front perineal area, Right upper leg   Body parts bathed by helper: Buttocks, Right arm, Left upper leg, Right lower leg, Left lower leg     Bathing assist Assist Level: Maximal Assistance - Patient 24 - 49%     Upper Body Dressing/Undressing Upper body dressing   What is the patient wearing?: Hospital gown only    Upper body assist Assist Level: Maximal Assistance - Patient 25 - 49%    Lower Body Dressing/Undressing Lower body dressing      What is the patient wearing?: Pants, Incontinence brief     Lower body assist Assist for lower body dressing: Maximal Assistance - Patient 25 - 49%     Toileting Toileting    Toileting assist Assist for toileting: 2 Helpers     Transfers Chair/bed transfer  Transfers assist     Chair/bed transfer assist level: 2 Helpers(slide board)     Locomotion Ambulation   Ambulation assist   Ambulation activity did not occur: Safety/medical concerns  Assist level: 2 helpers Assistive device: Other (comment)(hemiwalker) Max distance:  70ft   Walk 10 feet activity   Assist  Walk 10 feet activity did not occur: Safety/medical concerns  Assist level: 2 helpers Assistive device: Walker-hemi   Walk 50 feet activity   Assist Walk 50 feet with 2 turns activity did not occur: Safety/medical concerns         Walk 150 feet activity   Assist Walk 150 feet activity did not occur: Safety/medical concerns(lethargy)         Walk 10 feet on uneven surface   activity   Assist Walk 10 feet on uneven surfaces activity did not occur: Safety/medical concerns(lethargy)         Wheelchair     Assist Will patient use wheelchair at discharge?: Yes Type of Wheelchair: (TBD)    Wheelchair assist level: Dependent - Patient 0%      Wheelchair 50 feet with 2 turns activity    Assist        Assist Level: Dependent - Patient 0%   Wheelchair 150 feet activity     Assist     Assist Level: Dependent - Patient 0%    Medical Problem List and Plan: 1. 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    Repeat head CT personally reviewed, and reviewed with patient, showing some improvement  Helmet for safety when OOB.  5/23- OK'd daughter washing hair with close supervision- monitor closely  WHO/PRAFO nightly  Communicated with therapies, shoulder sling ordered.  Continue CIR 2.  Antithrombotics:  -DVT/anticoagulation:    Discussed with neurology, continue Eliquis             -antiplatelet therapy: Aspirin 325 mg daily initiated 11/17/2019 3. Pain Management: Tylenol as needed. Well controlled  5/23- pt reports pain on tailbone- treated with with tylenol 4. Mood: Amantadine 100 mg twice daily             -antipsychotic agents: N/A 5. Neuropsych: This patient is not fully capable of making decisions on her own behalf. 6. Skin/Wound Care: Routine skin checks  5/16: Gerhardt's butt cream ordered for anal fissure.  7. Fluids/Electrolytes/Nutrition: Routine in and outs.    BMP within acceptable range, except for glucose on 5/10, labs ordered for tomorrow  5/22- K+ 3.2- will replete and recheck Monday  8. Atrial fibrillation/pacemaker.    Failed  Coumadin, started Eliquis  Cardiac rate controlled.    Continue Cardizem 60 mg every 6 hours as well as Lopressor 25 mg twice daily.    Follow-up cardiology services as needed 9. Acute blood loss anemia.    Hemoglobin 11.6 on 5/18   Continue to monitor 10. Uncontrolled diabetes mellitus type 2 with hyperglycemia.  Hemoglobin A1c 9.7.    NovoLog 8 units BID with breakfast and lunch  Lantus insulin 21 units twice daily, decreased to 16 twice daily on 5/14, increased to 18 twice daily on 5/16, decreased to 17 twice daily on 5/18, decreased back to 16 twice daily on 5/20  Check blood sugars before meals and at bedtime.  Labile on 5/20, will consider increase of mealtime coverage in the next couple days  5/21: Sugars elevated. Increase Lantus to 17U BID.   CBG (last 3)  Recent Labs    12/06/19 1947 12/07/19 0606 12/07/19 1202  GLUCAP 320* 121* 106*    5/23- BGs good  overall except x1- wasn't sure if pt got outside snack brought in since everything else looks good. 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    Advance diet to D2 thins   Continue to advance as tolerated 12. Hyperlipidemia.  Lipitor 13. Essential HTN  See #8  Started lisinopril 2.5 daily on 5/16  Appears to be relatively controlled on 5/20, vital signs?  Pending 14. Insomnia: started melatonin 3mg  HS  5/22- slept better last night.     LOS: 17 days A FACE TO FACE EVALUATION WAS PERFORMED  Megan Lovorn 12/07/2019, 2:43 PM

## 2019-12-08 ENCOUNTER — Inpatient Hospital Stay (HOSPITAL_COMMUNITY): Payer: Medicare Other | Admitting: Physical Therapy

## 2019-12-08 ENCOUNTER — Inpatient Hospital Stay (HOSPITAL_COMMUNITY): Payer: Medicare Other | Admitting: Speech Pathology

## 2019-12-08 ENCOUNTER — Inpatient Hospital Stay (HOSPITAL_COMMUNITY): Payer: Medicare Other | Admitting: Occupational Therapy

## 2019-12-08 LAB — CBC WITH DIFFERENTIAL/PLATELET
Abs Immature Granulocytes: 0.02 10*3/uL (ref 0.00–0.07)
Basophils Absolute: 0 10*3/uL (ref 0.0–0.1)
Basophils Relative: 0 %
Eosinophils Absolute: 0.1 10*3/uL (ref 0.0–0.5)
Eosinophils Relative: 1 %
HCT: 36.3 % (ref 36.0–46.0)
Hemoglobin: 11.3 g/dL — ABNORMAL LOW (ref 12.0–15.0)
Immature Granulocytes: 0 %
Lymphocytes Relative: 21 %
Lymphs Abs: 1.4 10*3/uL (ref 0.7–4.0)
MCH: 30.1 pg (ref 26.0–34.0)
MCHC: 31.1 g/dL (ref 30.0–36.0)
MCV: 96.5 fL (ref 80.0–100.0)
Monocytes Absolute: 0.7 10*3/uL (ref 0.1–1.0)
Monocytes Relative: 11 %
Neutro Abs: 4.5 10*3/uL (ref 1.7–7.7)
Neutrophils Relative %: 67 %
Platelets: 199 10*3/uL (ref 150–400)
RBC: 3.76 MIL/uL — ABNORMAL LOW (ref 3.87–5.11)
RDW: 15.2 % (ref 11.5–15.5)
WBC: 6.7 10*3/uL (ref 4.0–10.5)
nRBC: 0 % (ref 0.0–0.2)

## 2019-12-08 LAB — GLUCOSE, CAPILLARY
Glucose-Capillary: 146 mg/dL — ABNORMAL HIGH (ref 70–99)
Glucose-Capillary: 174 mg/dL — ABNORMAL HIGH (ref 70–99)
Glucose-Capillary: 184 mg/dL — ABNORMAL HIGH (ref 70–99)
Glucose-Capillary: 176 mg/dL — ABNORMAL HIGH (ref 70–99)

## 2019-12-08 LAB — BASIC METABOLIC PANEL
Anion gap: 10 (ref 5–15)
BUN: 10 mg/dL (ref 8–23)
CO2: 26 mmol/L (ref 22–32)
Calcium: 8.9 mg/dL (ref 8.9–10.3)
Chloride: 104 mmol/L (ref 98–111)
Creatinine, Ser: 0.58 mg/dL (ref 0.44–1.00)
GFR calc Af Amer: >60 mL/min (ref >60)
GFR calc non Af Amer: >60 mL/min (ref >60)
Glucose, Bld: 133 mg/dL — ABNORMAL HIGH (ref 70–99)
Potassium: 3.5 mmol/L (ref 3.5–5.1)
Sodium: 140 mmol/L (ref 135–145)

## 2019-12-08 NOTE — Progress Notes (Signed)
Physical Therapy Session Note  Patient Details  Name: Krystal Little MRN: [id number] Date of Birth: [birth year]  Today's Date: 12/08/2019 PT Individual Time: 1594-5859 PT Individual Time Calculation (min): 27 min   Short Term Goals: Week 3:  PT Short Term Goal 1 (Week 3): Patient to be able to complete all bed mobility with min assist PT Short Term Goal 2 (Week 3): Patient to be able to complete all bed-chair transfers with MinAx1 PT Short Term Goal 3 (Week 3): Patient to be able to gait train at least 67f with hemiwalker and Min-ModA of 1 PT Short Term Goal 4 (Week 3): Paitent to be able to self propel WC at least 737fwiht no more than MinA  Skilled Therapeutic Interventions/Progress Updates:    [facility] for afternoon session. Sling donned with totalA, then worked on functional sit to stands with MinAx2- continues to need cues for upright posture and avoiding twisting trunk/hips when standing, did fairly well with visual feedback including mirror and line on floor placed in front of her toes to keep her from moving her feet. Tolerated multiple stands for about 30 seconds-1 minute. Attempted gait with hemiwalker this afternoon but too fatigued/unsafe. Otherwise practiced squat-pivot transfers- able to perform with MinAx2 this afternoon, increased assist likely due to fatigue. [facility] needs met, chair alarm active.   Therapy Documentation Precautions:  Precautions Precautions: Fall Precaution Comments: R hemicraniectomy- skull in R abdomen, L shoulder subluxation, L lateral lean and inattention, needs helmet for OOB, L weakness Restrictions Weight Bearing Restrictions: No Pain: Pain Assessment Pain Scale: 0-10 Pain Score: 0-No pain    Therapy/Group: Individual Therapy   KrWindell NorfolkDPT, PN1   Supplemental Physical Therapist [place]  Pager [phone] [phone]  12/08/2019, 3:59 PM

## 2019-12-08 NOTE — Progress Notes (Signed)
Occupational Therapy Session Note  Patient Details  Name: Krystal Little MRN: [id number] Date of Birth: 1955/12/01  Today's Date: 12/08/2019 OT Individual Time: 1015-1100 OT Individual Time Calculation (min): 45 min    Short Term Goals: Week 2:  OT Short Term Goal 1 (Week 2): Pt will be able to sit at EOB with mod A of 1 to prepare for transfers. OT Short Term Goal 2 (Week 2): Pt will be able to sit to stand at sink with mod A of 1. OT Short Term Goal 3 (Week 2): Pt will don shirt with mod A. OT Short Term Goal 4 (Week 2): Pt will complete toilet transfers with max A.  Skilled Therapeutic Interventions/Progress Updates:   Pt up in w/c with dtr present in room.  Pt requesting to brush her teeth sinkside.  Pt wanting to use oral swab instead of toothbrush.  Educated pt on benefits of thorough oral hygiene with toothbrush to promote good health.  Pt agreeable to using toothbrush but requiring max VCs and encouragement from dtr to follow through.  Pt appearing to perseverate on brushing teeth and needing repetitive VCs to transition to next step of rinsing mouth.  Provided two compensatory techniques for pt to choose from to doff shirt to facilitate pt autonomy.  Pt preferred grasping neck of shirt and pulling over head and required min assist with intermittent VCs to complete doffing over LUE.   Pt washed UB with assist needed to raise LUE to reach under arm.  Pt self initiating attempts to place washcloth in left hand and max manual assist provided to wash right side using LUE.  Pt required min assist STS at sink while blocking left knee in order to pull pants/brief down.  Mod VCs to attend to mirror for visual feedback and to increase upright posture during dependent posterior perihygiene. Educated pt on use of half lap tray to support LUE and encourage upright posture.     Pt had improved bilateral eye control and attention to midline without cues today.  Pt initiating next step in sequence of  tasks with less VCs.  Pt still needing some cues and training on self managing LUE, however improved left sided attention noted today.   Therapy Documentation Precautions:  Precautions Precautions: Fall Precaution Comments: R hemicraniectomy- skull in R abdomen, L shoulder subluxation, L lateral lean and inattention, needs helmet for OOB, L weakness Restrictions Weight Bearing Restrictions: No General: General PT Missed Treatment Reason: Other (Comment)(breakfast just arrived) Vital Signs:  Pain: Pain Assessment Pain Scale: 0-10 Pain Score: 0-No pain     Therapy/Group: Individual Therapy  Ezekiel Slocumb 12/08/2019, 2:21 PM

## 2019-12-08 NOTE — Plan of Care (Signed)
Mod assist with adls 

## 2019-12-08 NOTE — Progress Notes (Signed)
Occupational Therapy Note  Patient Details  Name: ORETA SOLOWAY MRN: [id number] Date of Birth: 1955-08-15  Today's Date: 12/08/2019 OT Individual Time: 1215-1300  UNATTENDED ESTIM OT Individual Time Calculation (min): 45 min   Pt received in wc with no c/o pain.  Agreeable to application of estim on L deltoids to reduce subluxation and increase muscle activity. estim applied and pt able to tolerate stimulation well.   Husband present. Educated pt and spouse on how to turn off unit if she became fatigued.  Otherwise, informed pt I would return at 1300 to remove unit.   Sa[employer]o Stim One 330 pulse width 35 Hz pulse rate On 8 sec/ off 8 sec Ramp up/ down 2 sec Symmetrical Biphasic wave form  Max intensity 14mA at 500 Ohm load  Returned at 1300 to remove estim. Per husband, one of the nurses turned the unit off early but after asking several nurses, no nurse stated they did that.  So unsure how long the stimulation was actually on her arm.      [place] 12/08/2019, 12:36 PM

## 2019-12-08 NOTE — Progress Notes (Signed)
Physical Therapy Weekly Progress Note  Patient Details  Name: Krystal Little MRN: [id number] Date of Birth: 08-25-1955  Beginning of progress report period: Dec 01, 2019 End of progress report period: Dec 08, 2019  Today's Date: 12/08/2019 PT Individual Time: 0815-0900 PT Individual Time Calculation (min): 45 min   Patient has met 1 of 3 short term goals, and has made considerable progress towards/has partly met the other 2 out of 3 goals set for this week. She has been able to perform bed to chair transfers with MinA of 2 in stedy, or ModAx1/standby of second person with hemiwalker recently, and has been able to to maintain midline sitting with as little as Min guard-MinA when not fatigued but as much as ModA when fatigued. Continues to progress well overall.   Patient continues to demonstrate the following deficits muscle weakness, decreased cardiorespiratoy endurance, unbalanced muscle activation, decreased coordination and decreased motor planning, decreased midline orientation, decreased attention to left and decreased motor planning and decreased sitting balance, decreased standing balance, decreased postural control, hemiplegia, decreased balance strategies and difficulty maintaining precautions and therefore will continue to benefit from skilled PT intervention to increase functional independence with mobility.  Patient progressing toward long term goals..  Continue plan of care.  PT Short Term Goals Week 2:  PT Short Term Goal 1 (Week 2): Patient to be able to gait train 30f with LRAD and Mod assist PT Short Term Goal 1 - Progress (Week 2): Met PT Short Term Goal 2 (Week 2): Patient to be able to perform bed  <-> chair transfers with MinAx2, LRAD PT Short Term Goal 2 - Progress (Week 2): Partly met PT Short Term Goal 3 (Week 2): Patient to be able to maintain midline sitting and standing iwth no more than MinA/Min cues PT Short Term Goal 3 - Progress (Week 2): Partly met Week 3:   PT Short Term Goal 1 (Week 3): Patient to be able to complete all bed mobility with min assist PT Short Term Goal 2 (Week 3): Patient to be able to complete all bed-chair transfers with MinAx1 PT Short Term Goal 3 (Week 3): Patient to be able to gait train at least 282fwith hemiwalker and Min-ModA of 1 PT Short Term Goal 4 (Week 3): Paitent to be able to self propel WC at least 7536fiht no more than MinA  Skilled Therapeutic Interventions/Progress Updates:    Patient received in bed today, pleasant and willing to participate in session. Able to roll to L side with MinA but continues to require heavy Mod-sometimes MaxA for rolling to right due to hemiparesis. Able to complete supine to sit with modA and use of bed features and needed Min-ModA to maintain upright sitting at midline today, continues with L lean but this is improving and better able to correct. Performed multiple sit to stands at EOB with ModAnstedd then maxA to maintain balance due to posterior lean- tends to almost push herself backwards while working hard to correct posture and was educated on this with improved posture/less posterior lean noted. Able to squat pivot to R with MinA. Continued with gait training at rail in hallway- actually able to gait train 32f41fth R rail, min guard, WC follow, and min cues for gait pattern this morning!!! Introduced WC mobility and tolerated self-propulsion using R LE/UE approximately 30ft65fh Min-ModA for straight line navigation before fatiguing. [facility], safety seatbelt on and daughter present.   Therapy Documentation  Precautions:  Precautions Precautions: Fall Precaution Comments: R hemicraniectomy- skull in R abdomen, L shoulder subluxation, L lateral lean and inattention, needs helmet for OOB, L weakness Restrictions Weight Bearing Restrictions: No General: PT Amount of Missed Time (min): 15 Minutes PT Missed Treatment Reason: Other (Comment)(breakfast just  arrived) Vital Signs:  Pain: Pain Assessment Pain Scale: 0-10 Pain Score: 0-No pain   Therapy/Group: Individual Therapy   Windell Norfolk, DPT, PN1   Supplemental Physical Therapist [place]    Pager [phone] Acute Rehab Office [phone]    12/08/2019, 12:15 PM

## 2019-12-08 NOTE — Progress Notes (Signed)
Speech Language Pathology Daily Session Note  Patient Details  Name: Krystal Little MRN: [id number] Date of Birth: 03-14-1956  Today's Date: 12/08/2019 SLP Individual Time: 1300-1359 SLP Individual Time Calculation (min): 59 min  Short Term Goals: Week 3: SLP Short Term Goal 1 (Week 3): Patient will consume current diet with minimal overt s/s of aspiration with supervision level verbal cues for use of swallowing compensatory strategies. SLP Short Term Goal 2 (Week 3): Pt will consume therapeutic trials of dys 3 textures with min cues to clear solids from the oral cavity and minimal overt s/s of aspiration over 3 consecutive sessions prior to advancement. SLP Short Term Goal 3 (Week 3): Pt will locate items needed for functional tasks at midline in >75% of opportunities with min assist multimodal cues. SLP Short Term Goal 4 (Week 3): Pt will sustain her attention to basic, familiar tasks for 15 minute intervals with min cues needed for redirection. SLP Short Term Goal 5 (Week 3): Pt will complete basic, familiar tasks with min assist multimodal cues for functional problem solving SLP Short Term Goal 6 (Week 3): Pt will utilize an increased vocal intensity and slow rate of speech to achieve intelligibility at the phrase/short sentence level with supervision assist verbal cues to achieve 90% intelligibility.  Skilled Therapeutic Interventions: Pt was seen for skilled ST targeting dysphagia and cognition. Pt's husband was present throughout session. Pt found consuming puree and dysphagia 2 lunch solids, during which she demonstrated efficient mastication and oral clearance with only Min A verbal cues to modify bite size. Large bites did results in min-mod anterior loss. Verbal cues provided to correct spoon placement to midline. During upgraded trial of Dysphagia 3 solid, pt exhibited 1 immediate cough when mixed thin H2O with cracker. Otherwise, pt's mastication was functional, although slightly  prolonged. Discussed importance of avoiding dual consistencies with pt and her husband. Recommend continue current diet.  During basic money management tasks, pt's verbal problem solving abilities were 100% accurate with no more than Supervision A verbal cues for problem solving. However, increased Mod A verbal and visual cues were required for functional problem solving and organization when manipulating coins to display intended amounts, as well as awareness of her errors. Fluctuating Mod-Max A multimodal cues provided for visual scanning throughout task. She sustained her attention with no more than Min A verbal cues for redirection throughout session. Pt left sitting in chair with alarm set and needs within reach, RN present. Continue per current plan of care.      Pain Pain Assessment Pain Scale: 0-10 Pain Score: 0-No pain  Therapy/Group: Individual Therapy  [name] 12/08/2019, 7:13 AM

## 2019-12-08 NOTE — Progress Notes (Signed)
[place] PHYSICAL MEDICINE & REHABILITATION PROGRESS NOTE  Subjective/Complaints: Patient seen sitting up in bed this morning eating breakfast.  [name].  She has questions regarding washing her hair.  She states she slept well overnight and had a good weekend.  ROS: Denies CP, SOB, N/V/D  Objective: Vital Signs: Blood pressure (!) 154/71, pulse 60, temperature 98.2 F (36.8 C), resp. rate 18, height 5\' 4"  (1.626 m), weight 63.2 kg, SpO2 96 %. No results found. Recent Labs    12/08/19 0620  WBC 6.7  HGB 11.3*  HCT 36.3  PLT 199   Recent Labs    12/08/19 0620  NA 140  K 3.5  CL 104  CO2 26  GLUCOSE 133*  BUN 10  CREATININE 0.58  CALCIUM 8.9    Physical Exam: BP (!) 154/71 (BP Location: Right Arm)   Pulse 60   Temp 98.2 F (36.8 C)   Resp 18   Ht 5\' 4"  (1.626 m)   Wt 63.2 kg   SpO2 96%   BMI 23.92 kg/m   Constitutional: No distress . Vital signs reviewed. HENT: Right craniectomy site C/D/I Eyes: EOMI. No discharge. Cardiovascular: No JVD. Respiratory: Normal effort.  No stridor. GI: Non-distended. Bone flap right lower quadrant C/D/I Skin: Warm and dry.  Intact. Psych: Flat Musc: No edema in extremities.  No tenderness in extremities. Neuro: Alert Motor: RUE: 4/5 proximal distal RLE: 4/5 proximal distal LUE: 0/5 proximal distal, unchanged LLE: Hip flexion, knee extension, ankle dorsiflexion 2/5, improving Left facial weakness  Assessment/Plan: 1. 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  Physiatrist is providing close team supervision and 24 hour management of active medical problems listed below.  Physiatrist and rehab team continue to assess barriers to discharge/monitor patient progress  toward functional and medical goals  Care Tool:  Bathing    Body parts bathed by patient: Chest, Face, Left arm, Abdomen, Front perineal area, Right upper leg   Body parts bathed by helper: Buttocks, Right arm, Left upper leg, Right lower leg, Left lower leg     Bathing assist Assist Level: Maximal Assistance - Patient 24 - 49%     Upper Body Dressing/Undressing Upper body dressing   What is the patient wearing?: Hospital gown only    Upper body assist Assist Level: Maximal Assistance - Patient 25 - 49%    Lower Body Dressing/Undressing Lower body dressing      What is the patient wearing?: Pants, Incontinence brief     Lower body assist Assist for lower body dressing: Maximal Assistance - Patient 25 - 49%     Toileting Toileting    Toileting assist Assist for toileting: 2 Helpers     Transfers Chair/bed transfer  Transfers assist     Chair/bed transfer assist level: Minimal Assistance - Patient > 75%(squat pivot to R)     Locomotion Ambulation   Ambulation assist   Ambulation activity did not occur: Safety/medical concerns  Assist level: 2 helpers Assistive device: Other (comment)(rail in hall) Max distance: 42ft   Walk 10 feet activity   Assist  Walk 10 feet activity did not occur: Safety/medical concerns  Assist level: 2 helpers Assistive device: Other (comment)(rail in hall)   Walk 50 feet activity   Assist Walk 50 feet with 2 turns activity did not occur: Safety/medical concerns  Walk 150 feet activity   Assist Walk 150 feet activity did not occur: Safety/medical concerns(lethargy)         Walk 10 feet on uneven surface  activity   Assist Walk 10 feet on uneven surfaces activity did not occur: Safety/medical concerns(lethargy)         Wheelchair     Assist Will patient use wheelchair at discharge?: Yes Type of Wheelchair: Manual    Wheelchair assist level: Moderate Assistance - Patient 50 - 74% Max  wheelchair distance: 7ft    Wheelchair 50 feet with 2 turns activity    Assist        Assist Level: Dependent - Patient 0%   Wheelchair 150 feet activity     Assist     Assist Level: Dependent - Patient 0%    Medical Problem List and Plan: 1. 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    Repeat head CT personally reviewed, and reviewed with patient, showing some improvement  Helmet for safety when OOB.  WHO/PRAFO nightly  Communicated with therapies, shoulder sling ordered.  Continue CIR 2.  Antithrombotics:  -DVT/anticoagulation:    Discussed with neurology, continue Eliquis             -antiplatelet therapy: Aspirin 325 mg daily initiated 11/17/2019 3. Pain Management: Tylenol as needed.  4. Mood: Amantadine 100 mg twice daily             -antipsychotic agents: N/A 5. Neuropsych: This patient is not fully capable of making decisions on her own behalf. 6. Skin/Wound Care: Routine skin checks  5/16: Gerhardt's butt cream ordered for anal fissure.  7. Fluids/Electrolytes/Nutrition: Routine in and outs.    BMP within acceptable range, except for glucose on 5/24  5/22- K+ 3.2- will replete and recheck Monday  8. Atrial fibrillation/pacemaker.    Failed Coumadin, started Eliquis  Cardiac rate controlled.    Continue Cardizem 60 mg every 6 hours as well as Lopressor 25 mg twice daily.    Follow-up cardiology services as needed 9. Acute blood loss anemia.    Hemoglobin 11.3 on 5/24   Continue to monitor 10. Uncontrolled diabetes mellitus type 2 with hyperglycemia.  Hemoglobin A1c 9.7.    NovoLog 8 units BID with breakfast and lunch  Lantus insulin 17 twice daily  Check blood sugars  before meals and at bedtime.  Labile on 5/24   CBG (last 3)  Recent Labs    12/07/19 2021 12/08/19 0555 12/08/19 1143  GLUCAP 226* 146* 174*   11. Post stroke dysphagia.    Advance diet to D2 thins  Continue to advance as tolerated 12. Hyperlipidemia.  Lipitor 13. Essential HTN  See #8  Started lisinopril 2.5 daily on 5/16  Slightly labile on 5/24 14. Insomnia: started melatonin 3mg  HS  5/22- slept better last night.     LOS: 18 days A FACE TO FACE EVALUATION WAS PERFORMED  Keiland Pickering Lorie Phenix 12/08/2019, 1:15 PM

## 2019-12-09 ENCOUNTER — Inpatient Hospital Stay (HOSPITAL_COMMUNITY): Payer: Medicare Other | Admitting: Speech Pathology

## 2019-12-09 ENCOUNTER — Inpatient Hospital Stay (HOSPITAL_COMMUNITY): Payer: Medicare Other | Admitting: Physical Therapy

## 2019-12-09 ENCOUNTER — Inpatient Hospital Stay (HOSPITAL_COMMUNITY): Payer: Medicare Other | Admitting: Occupational Therapy

## 2019-12-09 DIAGNOSIS — E876 Hypokalemia: Secondary | ICD-10-CM

## 2019-12-09 DIAGNOSIS — R0989 Other specified symptoms and signs involving the circulatory and respiratory systems: Secondary | ICD-10-CM

## 2019-12-09 LAB — GLUCOSE, CAPILLARY
Glucose-Capillary: 157 mg/dL — ABNORMAL HIGH (ref 70–99)
Glucose-Capillary: 213 mg/dL — ABNORMAL HIGH (ref 70–99)
Glucose-Capillary: 283 mg/dL — ABNORMAL HIGH (ref 70–99)
Glucose-Capillary: 342 mg/dL — ABNORMAL HIGH (ref 70–99)

## 2019-12-09 MED ORDER — ENSURE ENLIVE PO LIQD
237.0000 mL | Freq: Three times a day (TID) | ORAL | Status: DC
  Administered 2019-12-09 – 2019-12-13 (×12): 237 mL via ORAL

## 2019-12-09 MED ORDER — SENNOSIDES-DOCUSATE SODIUM 8.6-50 MG PO TABS
1.0000 | ORAL_TABLET | Freq: Two times a day (BID) | ORAL | Status: DC
  Administered 2019-12-09 – 2019-12-14 (×11): 1 via ORAL
  Filled 2019-12-09 (×11): qty 1

## 2019-12-09 MED ORDER — POLYETHYLENE GLYCOL 3350 17 G PO PACK
17.0000 g | PACK | Freq: Every day | ORAL | Status: DC
  Administered 2019-12-09 – 2019-12-13 (×5): 17 g via ORAL
  Filled 2019-12-09 (×5): qty 1

## 2019-12-09 NOTE — Plan of Care (Signed)
  Problem: RH Awareness Goal: LTG: Patient will demonstrate awareness during functional activites type of (SLP) Description: LTG: Patient will demonstrate awareness during functional activites type of (SLP) Flowsheets (Taken 12/09/2019 1238) Patient will demonstrate during cognitive/linguistic activities awareness type of: Emergent LTG: Patient will demonstrate awareness during cognitive/linguistic activities with assistance of (SLP): Minimal Assistance - Patient > 75%  Goal initiated due to impairments seen in therapy

## 2019-12-09 NOTE — Progress Notes (Signed)
Nutrition Follow-up  DOCUMENTATION CODES:   Not applicable  INTERVENTION:   -Ensure Enlive po TID, each supplement provides 350 kcal and 20 grams of protein  - Pro-stat 30 ml po BID, each supplement provides 100 kcal and 15 grams of protein  - Continue feeding assistance with meals  -MagicCup BID with lunch and dinner meals, each supplement provides 290 kcal and 9 grams of protein  -Encourage adequate PO intake  NUTRITION DIAGNOSIS:   Inadequate oral intake related to dysphagia as evidenced by meal completion < 50%, per patient/family report.  Progressing  GOAL:   Patient will meet greater than or equal to 90% of their needs  Progressing  MONITOR:   PO intake, Supplement acceptance, Diet advancement, Labs, Weight trends, TF tolerance, Skin  REASON FOR ASSESSMENT:   Consult Enteral/tube feeding initiation and management  ASSESSMENT:   64 year old female with PMH of HTN, DM, chronic atrial fibrillation. Presented 10/31/19 with left-sided weakness, neglect, and right gaze deviation. Cranial CT scan as well as CT angiogram head and neck showed a large right MCA territory nonhemorrhagic infarction. Hyperdense right MCA suggesting extensive thrombus from the right ICA terminus to the MCA bifurcation. Pt underwent endovascular thrombectomy revascularization per IR. Follow-up imaging showed worsening edema involving the right middle cerebral artery causing significant radiologic mass-effect and pt underwent decompressive right hemicraniectomy placement of bone flap into abdominal subcutaneous pocket on 11/02/19. Currently maintained on a dysphagia 1 diet with nectar-thick liquids and nasogastric tube feeds for nutritional support. Admitted to CIR on 5/06.  5/09 - nocturnal TF rate decreased to stimulate appetite 5/12 - TF d/c 5/13 - Cortrak d/c 5/19 - MBS, diet advanced to dysphagia 2 with thin liquids  Spoke with pt and daughter at bedside. Pt reports appetite is good  and that she is eating well. Pt states that she eats about 50% of her meals. RD encouraged pt to eat a bit more at meals. Discussed pt's weight loss. Pt reports drinking 1 Ensure Enlive shake daily. RD encouraged pt to drink at least 2. Will make shakes to come with meals rather than between meals per pt's request.  Current weight: 61.9 kg Admit weight: 66.7 kg  Meal Completion: 25-60% x last 8 meals  Medications reviewed and include: Ensure Enlive TID, Pro-stat BID, SSI, Lantus 17 units BID, protonix  Labs reviewed. CBG's: 157-184 x 24 hours  Diet Order:   Diet Order            DIET DYS 2 Room service appropriate? Yes; Fluid consistency: Thin  Diet effective now              EDUCATION NEEDS:   No education needs have been identified at this time  Skin:  Skin Assessment: Skin Integrity Issues: Incisions: incision to groin, head, abdomen Other: pretibial venous stasis ulcer  Last BM:  12/06/19  Height:   Ht Readings from Last 1 Encounters:  11/20/19 5\' 4"  (1.626 m)    Weight:   Wt Readings from Last 1 Encounters:  12/09/19 61.9 kg    Ideal Body Weight:  54.5 kg  BMI:  Body mass index is 23.42 kg/m.  Estimated Nutritional Needs:   Kcal:  1550-1750  Protein:  75-90 grams  Fluid:  >/= 1.6 L    Gaynell Face, MS, RD, LDN Inpatient Clinical Dietitian Pager: [phone] Weekend/After Hours: [phone]

## 2019-12-09 NOTE — Progress Notes (Signed)
Speech Language Pathology Daily Session Note  Patient Details  Name: ROSAMUND NYLAND MRN: [id number] Date of Birth: June 26, 1956  Today's Date: 12/09/2019 SLP Individual Time: 0730-0829 SLP Individual Time Calculation (min): 59 min  Short Term Goals: Week 3: SLP Short Term Goal 1 (Week 3): Patient will consume current diet with minimal overt s/s of aspiration with supervision level verbal cues for use of swallowing compensatory strategies. SLP Short Term Goal 2 (Week 3): Pt will consume therapeutic trials of dys 3 textures with min cues to clear solids from the oral cavity and minimal overt s/s of aspiration over 3 consecutive sessions prior to advancement. SLP Short Term Goal 3 (Week 3): Pt will locate items needed for functional tasks at midline in >75% of opportunities with min assist multimodal cues. SLP Short Term Goal 4 (Week 3): Pt will sustain her attention to basic, familiar tasks for 15 minute intervals with min cues needed for redirection. SLP Short Term Goal 5 (Week 3): Pt will complete basic, familiar tasks with min assist multimodal cues for functional problem solving SLP Short Term Goal 6 (Week 3): Pt will utilize an increased vocal intensity and slow rate of speech to achieve intelligibility at the phrase/short sentence level with supervision assist verbal cues to achieve 90% intelligibility.  Skilled Therapeutic Interventions: Pt was seen for skilled ST targeting dysphagia and cognition. Pt required Min A verbal cues for use of swallow strategies and recall of recommendation to avoid dual consistencies during consumption of current diet breakfast tray (dys 2/thin). Min anterior loss noted with solids. Pt with intermittent baseline congested cough, which made it difficult to differentiate overt s/sx aspiration, however did not appear clearly linked to PO intake. MD made aware of congested cough, he noted lung sounded clear. Recommend continue current diet.  Pt somewhat more lethargic  this AM in comparison to yesterday, requiring increased Min-Mod verbal cueing/prompts in order to sustain attention to functional tasks. During a basic 3-step action card sequencing task, pt required Mod A cues for use of a visual anchor for scanning and error awareness, Min A verbal cues for problem solving. Pt left laying in bed with alarm set and needs within reach. Continue per current plan of care.          Pain Pain Assessment Pain Scale: 0-10 Pain Score: 0-No pain  Therapy/Group: Individual Therapy  [name] 12/09/2019, 7:04 AM

## 2019-12-09 NOTE — Progress Notes (Signed)
Physical Therapy Session Note  Patient Details  Name: Krystal Little MRN: [id number] Date of Birth: 1956/01/15  Today's Date: 12/09/2019 PT Individual Time: 1315-1415 PT Individual Time Calculation (min): 60 min   Short Term Goals: Week 3:  PT Short Term Goal 1 (Week 3): Patient to be able to complete all bed mobility with min assist PT Short Term Goal 2 (Week 3): Patient to be able to complete all bed-chair transfers with MinAx1 PT Short Term Goal 3 (Week 3): Patient to be able to gait train at least 61f with hemiwalker and Min-ModA of 1 PT Short Term Goal 4 (Week 3): Paitent to be able to self propel WC at least 754fwiht no more than MinA  Skilled Therapeutic Interventions/Progress Updates:    [facility] and willing to participate in therapy. Spent a good bit of time working on squat pivot transfers to improve functional mobility- able to perform squat pivot to R with Min-ModA but needed MaxA to the left today; also practiced sliding board and able to perform multiple bed<-> chair transfers with Min-ModA but max cues for technique of SBTs. Otherwise continued working on static standing and postural corrections in static standing with hemiwalker, continues to have difficulty with R weight shift as well as fully extending L knee in stance. Continued gait training at rail in hallway with min guard-MinA with Mod cues for posture and technique with close WC follow. Left up in chair with all needs met, seatbelt alarm active this afternoon.   Therapy Documentation Precautions:  Precautions Precautions: Fall Precaution Comments: R hemicraniectomy- skull in R abdomen, L shoulder subluxation, L lateral lean and inattention, needs helmet for OOB, L weakness Restrictions Weight Bearing Restrictions: No General: PT Amount of Missed Time (min): 15 Minutes PT Missed Treatment Reason: Other (Comment)(dietary and nursing care) Pain: Pain Assessment Pain Scale: 0-10 Pain Score:  6  Pain Type: Acute pain Pain Location: Hip Pain Orientation: Left;Right;Anterior Pain Descriptors / Indicators: Aching;Sore Pain Onset: On-going Patients Stated Pain Goal: 0 Pain Intervention(s): RN made aware;Medication (See eMAR);Repositioned;Ambulation/increased activity    Therapy/Group: Individual Therapy   KrWindell NorfolkDPT, PN1   Supplemental Physical Therapist [place]  Pager [phone] Re[facility] Office [phone]  12/09/2019, 3:40 PM

## 2019-12-09 NOTE — Progress Notes (Signed)
[place] PHYSICAL MEDICINE & REHABILITATION PROGRESS NOTE  Subjective/Complaints: Patient seen sitting up in bed this morning eating breakfast with SLP.  Discussed swallowing and cough with therapies.  She did not get her hair washed yesterday, but hopes to get it washed today.  ROS: Denies CP, SOB, N/V/D  Objective: Vital Signs: Blood pressure 140/62, pulse 87, temperature 99.3 F (37.4 C), temperature source Oral, resp. rate 16, height 5\' 4"  (1.626 m), weight 61.9 kg, SpO2 96 %. No results found. Recent Labs    12/08/19 0620  WBC 6.7  HGB 11.3*  HCT 36.3  PLT 199   Recent Labs    12/08/19 0620  NA 140  K 3.5  CL 104  CO2 26  GLUCOSE 133*  BUN 10  CREATININE 0.58  CALCIUM 8.9    Physical Exam: BP 140/62 (BP Location: Right Arm)   Pulse 87   Temp 99.3 F (37.4 C) (Oral)   Resp 16   Ht 5\' 4"  (1.626 m)   Wt 61.9 kg   SpO2 96%   BMI 23.42 kg/m   Constitutional: No distress . Vital signs reviewed. HENT: Right craniectomy site C/D/I Eyes: EOMI. No discharge. Cardiovascular: No JVD. Respiratory: Normal effort.  No stridor.  Bilaterally clear to auscultation. GI: Non-distended. Bone flap right lower quadrant C/D/I Skin: Warm and dry.  Intact. Psych: Flat. Musc: No edema in extremities.  No tenderness in extremities. Neuro: Alert Motor: RUE: 4/5 proximal distal RLE: 4/5 proximal distal LUE: 0/5 proximal distal, stable LLE: Hip flexion, knee extension, ankle dorsiflexion 2+-3 -/5 Left facial weakness  Assessment/Plan: 1. 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  Physiatrist is providing close team supervision and 24 hour management of active medical problems listed below.  Physiatrist and rehab team continue to  assess barriers to discharge/monitor patient progress toward functional and medical goals  Care Tool:  Bathing    Body parts bathed by patient: Chest, Face, Left arm, Abdomen, Front perineal area, Right upper leg   Body parts bathed by helper: Buttocks, Right arm, Left upper leg, Right lower leg, Left lower leg     Bathing assist Assist Level: Maximal Assistance - Patient 24 - 49%     Upper Body Dressing/Undressing Upper body dressing   What is the patient wearing?: Hospital gown only    Upper body assist Assist Level: Maximal Assistance - Patient 25 - 49%    Lower Body Dressing/Undressing Lower body dressing      What is the patient wearing?: Pants, Incontinence brief     Lower body assist Assist for lower body dressing: Maximal Assistance - Patient 25 - 49%     Toileting Toileting    Toileting assist Assist for toileting: 2 Helpers     Transfers Chair/bed transfer  Transfers assist     Chair/bed transfer assist level: Minimal Assistance - Patient > 75%(squat pivot to R)     Locomotion Ambulation   Ambulation assist   Ambulation activity did not occur: Safety/medical concerns  Assist level: 2 helpers Assistive device: Other (comment)(rail in hall) Max distance: 52ft   Walk 10 feet activity   Assist  Walk 10 feet activity did not occur: Safety/medical concerns  Assist level: 2 helpers Assistive device: Other (comment)(rail in hall)   Walk 50 feet activity   Assist Walk 50 feet with  2 turns activity did not occur: Safety/medical concerns         Walk 150 feet activity   Assist Walk 150 feet activity did not occur: Safety/medical concerns(lethargy)         Walk 10 feet on uneven surface  activity   Assist Walk 10 feet on uneven surfaces activity did not occur: Safety/medical concerns(lethargy)         Wheelchair     Assist Will patient use wheelchair at discharge?: Yes Type of Wheelchair: Manual    Wheelchair assist  level: Moderate Assistance - Patient 50 - 74% Max wheelchair distance: 43ft    Wheelchair 50 feet with 2 turns activity    Assist        Assist Level: Dependent - Patient 0%   Wheelchair 150 feet activity     Assist     Assist Level: Dependent - Patient 0%    Medical Problem List and Plan: 1. 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    Repeat head CT personally reviewed, and reviewed with patient, showing some improvement  Helmet for safety when OOB.  WHO/PRAFO nightly  Communicated with therapies, shoulder sling ordered.  Continue CIR 2.  Antithrombotics:  -DVT/anticoagulation:    Discussed with neurology, continue Eliquis             -antiplatelet therapy: Aspirin 325 mg daily initiated 11/17/2019 3. Pain Management: Tylenol as needed.  4. Mood: Amantadine 100 mg twice daily             -antipsychotic agents: N/A 5. Neuropsych: This patient is not fully capable of making decisions on her own behalf. 6. Skin/Wound Care: Routine skin checks  5/16: Gerhardt's butt cream ordered for anal fissure.  7. Fluids/Electrolytes/Nutrition: Routine in and outs.    BMP within acceptable range, except for glucose on 5/24 8. Atrial fibrillation/pacemaker.    Failed Coumadin, started Eliquis  Cardiac rate controlled.    Continue Cardizem 60 mg every 6 hours as well as Lopressor 25 mg twice daily.    Follow-up cardiology services as needed 9. Acute blood loss anemia.    Hemoglobin 11.3 on 5/24   Continue to monitor 10. Uncontrolled diabetes mellitus type 2 with hyperglycemia.  Hemoglobin A1c 9.7.    NovoLog 8 units BID with breakfast and lunch  Lantus insulin 17 twice daily  Check blood sugars  before meals and at bedtime.  Elevated, stabilizing on 5/25, will consider further adjustments tomorrow   CBG (last 3)  Recent Labs    12/08/19 1646 12/08/19 1952 12/09/19 0556  GLUCAP 184* 176* 157*   11. Post stroke dysphagia.    Advance diet to D2 thins  Continue to advance as tolerated 12. Hyperlipidemia.  Lipitor 13. Essential HTN  See #8  Started lisinopril 2.5 daily on 5/16  Labile on 5/25 14.  Sleep disturbance:   Melatonin 3mg  HS  Improving 15.  Hypokalemia  Potassium 3.5 on 5/24 after supplementation   LOS: 19 days A FACE TO FACE EVALUATION WAS PERFORMED  Renelda Kilian Lorie Phenix 12/09/2019, 8:57 AM

## 2019-12-09 NOTE — Progress Notes (Signed)
Occupational Therapy Note  Patient Details  Name: Krystal Little MRN: [id number] Date of Birth: 1956-07-01  Today's Date: 12/09/2019  Unattended estim for 60 minutes 4098-1191  Estim applied to L deltoid for cyclic estim to decrease subluxation in shoulder.  Pt tolerated estim well and no adverse skin reactions.  Used Saebo stim One for the stimulation  Saebo Stim One 330 pulse width 35 Hz pulse rate On 8 sec/ off 8 sec Ramp up/ down 2 sec Symmetrical Biphasic wave form  Max intensity 185mA at 500 Ohm load  SAGUIER,JULIA 12/09/2019, 12:29 PM

## 2019-12-09 NOTE — Progress Notes (Signed)
Occupational Therapy Session Note  Patient Details  Name: Krystal Little MRN: [id number] Date of Birth: 09-05-1955  Today's Date: 12/09/2019 OT Individual Time: 5093-2671 OT Individual Time Calculation (min): 60 min    Short Term Goals: Week 2:  OT Short Term Goal 1 (Week 2): Pt will be able to sit at EOB with mod A of 1 to prepare for transfers. OT Short Term Goal 2 (Week 2): Pt will be able to sit to stand at sink with mod A of 1. OT Short Term Goal 3 (Week 2): Pt will don shirt with mod A. OT Short Term Goal 4 (Week 2): Pt will complete toilet transfers with max A.  Skilled Therapeutic Interventions/Progress Updates:  Pt in w/c with dtr present upon OT arrival.  Pt with no complaints of pain today. Min assist for STS at [facility] and total assist from w/c to shower bench in prep for UB/LB dressing and bathing.  Pt self initiating compensatory technique to doff shirt overhead needing mod VCs and hand over hand assist to grasp shirt efficiently and to complete un-threading of LUE.  Pt needed max assist to doff pants in standing at [facility].  Pt exhibited improved sitting posture and self righting to midline throughout shower needing less VCs to maintain.  Pt did need intermittent VCs to right self usually when dividing attention with bathing specific tasks.  Pt also self initiated washing LUE without cues needed from therapist today.  Manual assist to lift LUE for pt to wash underarm.  Re-educated pt on use of long handled sponge to wash RUE due to pt stating she forgot how.  Pt washed LB with intermittent VCs to ensure all body parts bathed and min assist and max VCs to wash buttocks with weight shifting technique in sitting.  Pts dtr present to gently wash pts hair with OT providing supervision to ensure safety and upright posture.  Pt able to recall proper compensatory technique to donn shorts, however needing re-ed on threading affected side of shirt first.  Pt donned shirt, brief, and shorts with max  assist.  Socks and shoes donned with max assist and cues to recruit BLE AROM to assist.  Pt exhibited good awareness of deficits stating "My balance is getting better but it still needs some work".  Pt up in w/c, dtr present, seat belt alarm donned.       Therapy Documentation Precautions:  Precautions Precautions: Fall Precaution Comments: R hemicraniectomy- skull in R abdomen, L shoulder subluxation, L lateral lean and inattention, needs helmet for OOB, L weakness Restrictions Weight Bearing Restrictions: No       Therapy/Group: Individual Therapy  Ezekiel Slocumb 12/09/2019, 3:11 PM

## 2019-12-10 ENCOUNTER — Inpatient Hospital Stay (HOSPITAL_COMMUNITY): Payer: Medicare Other | Admitting: Speech Pathology

## 2019-12-10 ENCOUNTER — Inpatient Hospital Stay (HOSPITAL_COMMUNITY): Payer: Medicare Other | Admitting: Occupational Therapy

## 2019-12-10 ENCOUNTER — Inpatient Hospital Stay (HOSPITAL_COMMUNITY): Payer: Medicare Other | Admitting: *Deleted

## 2019-12-10 LAB — GLUCOSE, CAPILLARY
Glucose-Capillary: 147 mg/dL — ABNORMAL HIGH (ref 70–99)
Glucose-Capillary: 277 mg/dL — ABNORMAL HIGH (ref 70–99)
Glucose-Capillary: 332 mg/dL — ABNORMAL HIGH (ref 70–99)
Glucose-Capillary: 354 mg/dL — ABNORMAL HIGH (ref 70–99)

## 2019-12-10 MED ORDER — INSULIN ASPART 100 UNIT/ML ~~LOC~~ SOLN
8.0000 [IU] | Freq: Three times a day (TID) | SUBCUTANEOUS | Status: DC
  Administered 2019-12-10 – 2019-12-11 (×4): 8 [IU] via SUBCUTANEOUS

## 2019-12-10 NOTE — Progress Notes (Signed)
Physical Therapy Session Note  Patient Details  Name: Krystal Little MRN: [id number] Date of Birth: Jun 03, 1956  Today's Date: 12/10/2019 PT Individual Time: 1332-1445 PT Individual Time Calculation (min): 73 min    Short Term Goals: Week 3:  PT Short Term Goal 1 (Week 3): Patient to be able to complete all bed mobility with min assist PT Short Term Goal 2 (Week 3): Patient to be able to complete all bed-chair transfers with MinAx1 PT Short Term Goal 3 (Week 3): Patient to be able to gait train at least 57ft with hemiwalker and Min-ModA of 1 PT Short Term Goal 4 (Week 3): Paitent to be able to self propel WC at least 61ft wiht no more than MinA  Skilled Therapeutic Interventions/Progress Updates:    Pt seated in w/c upon PT arrival, agreeable to therapy tx and denies pain. Pt's daughter present for this session as well as rec therapist for first half of session. Donned sling for L UE total assist, transported to the gym. Pt performed lateral scoot to the mat with min assist. This session pt worked on standing balance without UE support while performing reaching activity to facilitate R lateral weightshift x 3 trials with min-mod assist for standing balance, therapist providing facilitation for increased L knee extension with some active knee extension noted when patient would reach over head. Pt with occasional L lean when L knee flexed but with facilitation for L knee extension, pt able to maintain midline standing. Pt worked on standing balance, L LE weightbearing and stance control to perform R LE sidesteps in place x 10 without UE support, therapist continues to provide facilitation for L LE knee/hip extension during this. Pt worked on pre-gait and standing balance this session with use of eva walker to incorportate LUE weightbearing, min assist to stand and min assist for standing balance with this. Pt ambulated x 14 ft this session with max assist and use of eva walker for UE support, +2 to  help with steering the eva walker and pt's daughter brings w/c for w/c follow. During gait with eva walker cues for step length, faciltiation for L LE knee extension/hip extension during stance, and facilitation for L hip flexion/knee flexion during swing for LE advancement - pt able to initiate L step and able to activate quads in stance. Pt transported back to room. Pt transferred w/c<>toilet with stedy this session, continent of bladder and bowel, total assist for pericare and clothing management. Pt left in w/c at end of session with daughter present and needs in reach.   Therapy Documentation Precautions:  Precautions Precautions: Fall Precaution Comments: R hemicraniectomy- skull in R abdomen, L shoulder subluxation, L lateral lean and inattention, needs helmet for OOB, L weakness Restrictions Weight Bearing Restrictions: No    Therapy/Group: Individual Therapy  Netta Corrigan, PT, DPT, CSRS 12/10/2019, 8:00 AM

## 2019-12-10 NOTE — Progress Notes (Signed)
Team Conference Report to Patient/Family  Team Conference discussion was reviewed with the patient and caregiver, including goals, any changes in plan of care and target discharge date.  Patient and caregiver express understanding and are in agreement.  The patient has a target discharge date of 01/01/20. Family education session scheduled for June 9th 9-11AM spouse and daughter will be present  Dyanne Iha 12/10/2019, 2:06 PM Patient ID: Krystal Little, female   DOB: August 31, 1955, 64 y.o.   MRN: [id number]

## 2019-12-10 NOTE — Patient Care Conference (Signed)
Inpatient RehabilitationTeam Conference and Plan of Care Update Date: 12/10/2019   Time: 2:38 PM    Patient Name: Krystal Little      Medical Record Number: [id number]  Date of Birth: 1955-12-13 Sex: Female         Room/Bed: 4W14C/4W14C-02 Payor Info: Payor: [employer] MEDICARE / Plan: [facility] MEDICARE / Product Type: *No Product type* /    Admit Date/Time:  11/20/2019  6:17 PM  Primary Diagnosis:  Right middle cerebral artery stroke [facility])  Patient Active Problem List   Diagnosis Date Noted  . Labile blood pressure   . Essential hypertension   . Benign essential HTN   . Labile blood glucose   . Status post craniectomy   . Dysphagia, post-stroke   . Atrial fibrillation ([place])   . Left hemiparesis ([place])   . Cerebral edema ([place]) 11/20/2019  . ICH (intracerebral hemorrhage), SAH ([place]) result of ischemic stroke s/p crani 11/20/2019  . Dysphagia due to recent cerebral infarction 11/20/2019  . Acute blood loss anemia 11/20/2019  . Right middle cerebral artery stroke ([place]) 11/20/2019  . History of ETT   . Hypokalemia   . Acute respiratory failure with hypoxemia ([place]) 11/02/2019  . Cerebrovascular accident (CVA) due to embolism of right middle cerebral artery ([place]) 10/31/2019  . Chronic venous insufficiency 10/07/2019  . Wound of RLE  08/18/2019  . Hyperlipidemia associated with type 2 diabetes mellitus ([place]) 09/25/2016  . Diastolic dysfunction without heart failure 06/02/2015  . Health care maintenance 03/24/2013  . Hypertension associated with diabetes ([place]) 10/16/2011  . Long term current use of anticoagulant therapy 08/27/2010  . DM (diabetes mellitus), type 2, uncontrolled ([place]) 02/11/2008  . MITRAL REGURGITATION - status post MVR 09/14/1999  . S/P MVR (mitral valve repair) 08/09/1999  . Atrial fibrillation with RVR ([place]) 08/03/1999  . S/P placement of cardiac pacemaker 07/18/1999    Expected Discharge Date: Expected Discharge Date: 01/01/20  Team Members  Present: Physician leading conference: Dr. Delice Lesch Care Coodinator Present: Nestor Lewandowsky, RN, BSN, CRRN;Christina Sampson Goon, [place] Nurse Present: Mohammed Kindle, RN PT Present: Deniece Ree, PT OT Present: Meriel Pica, OT SLP Present: Jettie Booze, CF-SLP PPS Coordinator present : Ileana Ladd, PT     Current Status/Progress Goal Weekly Team Focus  Bowel/Bladder   Patient continent of bowel and bladder with periods of incontinence. LBM 5/25  q2 toileting while awake.  assess bowle and bladder needs qshift and PRN   Swallow/Nutrition/ Hydration   Dysphagia 2, thin, full supervision, Min  Supervision  tolerance current diet, continue trials Dys 3, self feeding and swallow precautions   ADL's   mod A overall UB self care, max LB self care with sit to stands requiring max A for balance, improving squat pivot transfers,  no change in LUE volitional movement.  improving L visual scanning  min A overall  Postural control, L visual scanning, LUE NMR, family education, ADL training   Mobility   ModA bed mobility, up to MinAx2 with transfers if really fatigued but able to perofrm squat pivot and sliding board trnasfers with ModA, gait at rail 7ft with [place]  still fine tuning all aspects of functional mobility, working towards transfers and gait with hemi walker, squat and sliding board transfers   Communication   Min A intelligibility  Supervision  increased vocal intensity   Safety/Cognition/ Behavioral Observations  Min-Mod (although visual scanning can further impact at times, depending on task)  Min A  functionla problem solving,  sustained attention, error awareness   Pain   No c/o pain  To remain pain free.  assess pain qshift and PRN   Skin   fissure to bottom gerharts crean applied with brief changes. abrasion to lower right leg scabed over. foam dressings in place.  Q2 turns, foam dressings, keep dry  assess skin qshift and PRN    Rehab Goals Patient on  target to meet rehab goals: Yes Rehab Goals Revised: Patient on target with current set goals *See Care Plan and progress notes for long and short-term goals.     Barriers to Discharge  Current Status/Progress Possible Resolutions Date Resolved   Nursing                  PT  Inaccessible home environment;Home environment access/layout  will likely need ramp for home entry; progresisng slowly and would really benefit from extended time on unit to maximize function with family              OT                  SLP                Care Coordinator Medical stability              Discharge Planning/Teaching Needs:  Patient and spouse plan to discharge home  Will schedule education if recommended   Team Discussion:  Slow steady progress affected by fatigue and visual deficits. Note memory, attention and processing have improved. Noted cough and congestion with questionable correlation with D2/thin diet. Also note left UE improvement with e-stim treatments. ELOS extended due to steady progression noted with plan for discharge home.  Revisions to Treatment Plan:  SLP: cognition goals upgraded due to progress noted.     Medical Summary Current Status: Left-sided hemiparesis and right gaze deviation secondary to large right MCA infarct due to right ICA occlusion status post revascularization/endovascular thrombectomy complicated by worsening edema and mass-effect status post decompressive right hemicraniectomy placement of bone flap into abdominal subcutaneous pocket 11/02/2019. Weekly Focus/Goal: Improve mobility, CBGs, BP, hypokalemia, swallowing  Barriers to Discharge: Incontinence;Medical stability   Possible Resolutions to Barriers: Therapies, optimize HTN/DM meds, continue to advance diet as tolerated   Continued Need for Acute Rehabilitation Level of Care: The patient requires daily medical management by a physician with specialized training in physical medicine and rehabilitation for  the following reasons: Direction of a multidisciplinary physical rehabilitation program to maximize functional independence : Yes Medical management of patient stability for increased activity during participation in an intensive rehabilitation regime.: Yes Analysis of laboratory values and/or radiology reports with any subsequent need for medication adjustment and/or medical intervention. : Yes   I attest that I was present, lead the team conference, and concur with the assessment and plan of the team.   Dorien Chihuahua B 12/10/2019, 2:38 PM

## 2019-12-10 NOTE — Progress Notes (Signed)
Occupational Therapy Session Note  Patient Details  Name: Krystal Little MRN: [id number] Date of Birth: 1956-01-17  Today's Date: 12/10/2019 OT Individual Time: 1102-1205 OT Individual Time Calculation (min): 63 min    Short Term Goals: Week 2:  OT Short Term Goal 1 (Week 2): Pt will be able to sit at EOB with mod A of 1 to prepare for transfers. OT Short Term Goal 2 (Week 2): Pt will be able to sit to stand at sink with mod A of 1. OT Short Term Goal 3 (Week 2): Pt will don shirt with mod A. OT Short Term Goal 4 (Week 2): Pt will complete toilet transfers with max A.  Skilled Therapeutic Interventions/Progress Updates:  Pt up in w/c upon OT arrival.  Pt reports she wants to wash up at the sink and brush her teeth.  Pt with no pain reported or exhibited throughout session.  ADL items placed to left side of sink to facilitate left attention.  Pt required increased time, intermittent VCs to instruct in scanning to left, and therapist pointing to item to locate.  Pt used hemitechnique to doff cap of toothpaste and apply to toothbrush.  OT noted pt spitting out significant amount of chewed food during oral care.  Notified SLP of OTs observations at end of session.  Pt self initiated steps of sinkside bathing including turning on water, grasping washcloth, and washing most areas of upper body.  Pt needing cue to utilize long handled sponge and re-education on compensatory techniques to wash RUE.  Pt required min assist + 2 sit<> stand and maintain static/dynamic standing while pt washed periarea and buttocks with washcloth.  Pt instructed intermittently throughout session to look in the mirror to facilitate self righting to midline.    Pt needing re-education on hemitechnique to donn shirt over head, and needed less assist for follow through of mod assist.  Pt able to recall proper hemi strategy to donn brief and pants without cues. OT donned pants, brief, socks, and shoes with max assist (min assist + 2  for sit<>stand),secondary to time constraint.  Educated pt on hemitechnique to donn socks with visual demonstration, and plan to trial in future sessions.  Pt more engaged with ongoing conversations throughout this session and exhibited improved ability to divide attention.  Pt in w/c with call bell in reach, pts husband and dtr in room.     Therapy Documentation Precautions:  Precautions Precautions: Fall Precaution Comments: R hemicraniectomy- skull in R abdomen, L shoulder subluxation, L lateral lean and inattention, needs helmet for OOB, L weakness Restrictions Weight Bearing Restrictions: No   Therapy/Group: Individual Therapy  Ezekiel Slocumb 12/10/2019, 12:46 PM

## 2019-12-10 NOTE — Progress Notes (Signed)
Speech Language Pathology Daily Session Note  Patient Details  Name: Krystal Little MRN: [id number] Date of Birth: 08/21/1955  Today's Date: 12/10/2019 SLP Individual Time: 0930-1029 SLP Individual Time Calculation (min): 59 min  Short Term Goals: Week 3: SLP Short Term Goal 1 (Week 3): Patient will consume current diet with minimal overt s/s of aspiration with supervision level verbal cues for use of swallowing compensatory strategies. SLP Short Term Goal 2 (Week 3): Pt will consume therapeutic trials of dys 3 textures with min cues to clear solids from the oral cavity and minimal overt s/s of aspiration over 3 consecutive sessions prior to advancement. SLP Short Term Goal 3 (Week 3): Pt will locate items needed for functional tasks at midline in >75% of opportunities with min assist multimodal cues. SLP Short Term Goal 4 (Week 3): Pt will sustain her attention to basic, familiar tasks for 15 minute intervals with min cues needed for redirection. SLP Short Term Goal 5 (Week 3): Pt will complete basic, familiar tasks with min assist multimodal cues for functional problem solving SLP Short Term Goal 6 (Week 3): Pt will utilize an increased vocal intensity and slow rate of speech to achieve intelligibility at the phrase/short sentence level with supervision assist verbal cues to achieve 90% intelligibility.  Skilled Therapeutic Interventions: Pt was seen for skilled ST targeting dysphagia and cognition. SLP provided upgraded trial of dysphagia 3 (mechnical soft) solids, during which pt's lingual manipulation appeared weak and mastication somewhat prolonged but functional. Pt is slow to consume POs across consistencies, and there was no appreciable difference in length of time it took her to consume snack today in comparison to snack of current recommended textures. Pt continues to have baseline intermittent congested cough, but not cough appeared directly linked to solid or liquid intake. Pt with good  recall of swallow recommendations, and utilized them with Supervision A verbal cues. Recommend continue current diet for now, and ST will continue to provide opportunities for advancement as appropriate.  Pt was somewhat internally distracted (but appropriate) by discharge plans and anxious about getting her home ready/equiptment secured, etc., however once talked through/answered pt and daughter's immediate questions regarding current plan pt easily redirected to other tasks with Min A verbal cues for redirection throughout session. During a basic functional medication management task, pt required Mod A for visual scanning and use of visual anchor and finger for tracking to read and interpret medication labels. Overall Mod A verbal and visual cues provided for problem solving when organizing a basic QID pill chart according to the labels. Pt left sitting in chair with alarm set and needs within reach. Continue per current plan of care.      Pain Pain Assessment Pain Scale: 0-10 Pain Score: 0-No pain  Therapy/Group: Individual Therapy  [name] 12/10/2019, 7:05 AM

## 2019-12-10 NOTE — Care Management (Signed)
Patient ID: Krystal Little, female   DOB: [birth year], 64 y.o.   MRN: [id number]  Met with patient's nurse to review nursing concerns. Constipation addressed with the PA for stool softener and /or laxative order. Patient with frequent urination however no other symptoms of UTI; denies burning, pain with urination, fever, etc. CBGs trending up. Trouble with eating due to left neglect; SLP reassessed and restarted full supervision for meals.

## 2019-12-10 NOTE — Progress Notes (Signed)
[facility] PHYSICAL MEDICINE & REHABILITATION PROGRESS NOTE  Subjective/Complaints: Patient seen sitting up in bed this morning.  She states she slept fairly overnight.  She states she has a headache this morning, but has not asked for pain medications.  ROS: Denies CP, SOB, N/V/D  Objective: Vital Signs: Blood pressure 129/70, pulse 64, temperature 98.7 F (37.1 C), temperature source Oral, resp. rate 18, height 5\' 4"  (1.626 m), weight 61.7 kg, SpO2 97 %. No results found. Recent Labs    12/08/19 0620  WBC 6.7  HGB 11.3*  HCT 36.3  PLT 199   Recent Labs    12/08/19 0620  NA 140  K 3.5  CL 104  CO2 26  GLUCOSE 133*  BUN 10  CREATININE 0.58  CALCIUM 8.9    Physical Exam: BP 129/70   Pulse 64   Temp 98.7 F (37.1 C) (Oral)   Resp 18   Ht 5\' 4"  (1.626 m)   Wt 61.7 kg   SpO2 97%   BMI 23.35 kg/m   Constitutional: No distress . Vital signs reviewed. HENT: Right craniectomy site C/D/I Eyes: EOMI. No discharge. Cardiovascular: No JVD. Irregularly irregular Respiratory: Normal effort.  No stridor. Bilaterally clear to auscultation GI: Non-distended. Bone flap right lower quadrant C/D/I Skin: Warm and dry.  Intact. Psych: Flat. Musc: No edema in extremities.  No tenderness in extremities. Neuro: Alert Motor: RUE: 4/5 proximal distal RLE: 4/5 proximal distal LUE: 0/5 proximal distal, brief trace movement noted in fingers LLE: Hip flexion, knee extension, ankle dorsiflexion 2+-3 -/5, stable Left facial weakness  Assessment/Plan: 1. 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  Physiatrist is providing close team supervision and 24 hour management of active medical problems listed below.  Physiatrist and rehab team  continue to assess barriers to discharge/monitor patient progress toward functional and medical goals  Care Tool:  Bathing    Body parts bathed by patient: Left arm, Chest, Abdomen, Front perineal area, Right upper leg, Left upper leg, Face, Right lower leg, Left lower leg   Body parts bathed by helper: Buttocks, Right arm     Bathing assist Assist Level: Moderate Assistance - Patient 50 - 74%     Upper Body Dressing/Undressing Upper body dressing   What is the patient wearing?: Pull over shirt    Upper body assist Assist Level: Maximal Assistance - Patient 25 - 49%    Lower Body Dressing/Undressing Lower body dressing      What is the patient wearing?: Pants, Incontinence brief     Lower body assist Assist for lower body dressing: Maximal Assistance - Patient 25 - 49%     Toileting Toileting    Toileting assist Assist for toileting: 2 Helpers     Transfers Chair/bed transfer  Transfers assist     Chair/bed transfer assist level: Moderate Assistance - Patient 50 - 74%(sliding board)     Locomotion Ambulation   Ambulation assist   Ambulation activity did not occur: Safety/medical concerns  Assist level: 2 helpers Assistive device: Other (comment)(rail in hallway) Max distance: 16ft   Walk 10 feet activity   Assist  Walk 10 feet activity did not occur: Safety/medical concerns  Assist level: 2 helpers Assistive device: Other (comment)(rail in hallway)   Walk 50 feet activity   Assist Walk 50 feet with 2 turns  activity did not occur: Safety/medical concerns         Walk 150 feet activity   Assist Walk 150 feet activity did not occur: Safety/medical concerns(lethargy)         Walk 10 feet on uneven surface  activity   Assist Walk 10 feet on uneven surfaces activity did not occur: Safety/medical concerns(lethargy)         Wheelchair     Assist Will patient use wheelchair at discharge?: Yes Type of Wheelchair: Manual     Wheelchair assist level: Moderate Assistance - Patient 50 - 74% Max wheelchair distance: 70ft    Wheelchair 50 feet with 2 turns activity    Assist        Assist Level: Dependent - Patient 0%   Wheelchair 150 feet activity     Assist     Assist Level: Dependent - Patient 0%    Medical Problem List and Plan: 1. 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    Repeat head CT personally reviewed, and reviewed with patient, showing some improvement  Helmet for safety when OOB.  WHO/PRAFO nightly  Communicated with therapies, shoulder sling ordered.  Continue CIR  Team conference today to discuss current and goals and coordination of care, home and environmental barriers, and discharge planning with nursing, case manager, and therapies.  2.  Antithrombotics:  -DVT/anticoagulation:    Discussed with neurology, continue Eliquis             -antiplatelet therapy: Aspirin 325 mg daily initiated 11/17/2019 3. Pain Management: Tylenol as needed.  4. Mood: Amantadine 100 mg twice daily             -antipsychotic agents: N/A 5. Neuropsych: This patient is not fully capable of making decisions on her own behalf. 6. Skin/Wound Care: Routine skin checks 7. Fluids/Electrolytes/Nutrition: Routine in and outs.    BMP within acceptable range, except for glucose on 5/24 8. Atrial fibrillation/pacemaker.    Failed Coumadin, started Eliquis  Cardiac rate controlled.    Continue Cardizem 60 mg every 6 hours as well as Lopressor 25 mg twice daily.    Follow-up cardiology services as needed 9. Acute blood loss anemia.    Hemoglobin 11.3 on 5/24   Continue to monitor 10. Uncontrolled diabetes mellitus type 2  with hyperglycemia.  Hemoglobin A1c 9.7.    NovoLog 8 units BID, changed to 3 times daily on 5/26  Lantus insulin 17 twice daily  Check blood sugars before meals and at bedtime.  Labile on 5/26   CBG (last 3)  Recent Labs    12/09/19 1723 12/09/19 1949 12/10/19 0611  GLUCAP 283* 342* 147*   11. Post stroke  dysphagia.    Advance diet to D2 thins  Continue to advance as tolerated 12. Hyperlipidemia.  Lipitor 13. Essential HTN  See #8  Started lisinopril 2.5 daily on 5/16  Relatively controlled on 5/26 14.  Sleep disturbance:   Melatonin 3mg  HS  Improving 15.  Hypokalemia  Potassium 3.5 on 5/24 after supplementation   LOS: 20 days A FACE TO FACE EVALUATION WAS PERFORMED  Tannisha Kennington Lorie Phenix 12/10/2019, 9:18 AM

## 2019-12-11 ENCOUNTER — Inpatient Hospital Stay (HOSPITAL_COMMUNITY): Payer: Medicare Other | Admitting: Speech Pathology

## 2019-12-11 ENCOUNTER — Inpatient Hospital Stay (HOSPITAL_COMMUNITY): Payer: Medicare Other | Admitting: *Deleted

## 2019-12-11 ENCOUNTER — Inpatient Hospital Stay (HOSPITAL_COMMUNITY): Payer: Medicare Other | Admitting: Occupational Therapy

## 2019-12-11 ENCOUNTER — Inpatient Hospital Stay (HOSPITAL_COMMUNITY): Payer: Medicare Other | Admitting: Physical Therapy

## 2019-12-11 LAB — GLUCOSE, CAPILLARY
Glucose-Capillary: 171 mg/dL — ABNORMAL HIGH (ref 70–99)
Glucose-Capillary: 370 mg/dL — ABNORMAL HIGH (ref 70–99)
Glucose-Capillary: 289 mg/dL — ABNORMAL HIGH (ref 70–99)
Glucose-Capillary: 121 mg/dL — ABNORMAL HIGH (ref 70–99)

## 2019-12-11 MED ORDER — INSULIN ASPART 100 UNIT/ML ~~LOC~~ SOLN
10.0000 [IU] | Freq: Three times a day (TID) | SUBCUTANEOUS | Status: DC
  Administered 2019-12-11: 10 [IU] via SUBCUTANEOUS

## 2019-12-11 NOTE — Plan of Care (Signed)
  Problem: Consults Goal: RH STROKE PATIENT EDUCATION Description: See Patient Education module for education specifics  Outcome: Progressing Goal: Nutrition Consult-if indicated Outcome: Progressing   Problem: RH BLADDER ELIMINATION Goal: RH STG MANAGE BLADDER WITH ASSISTANCE Description: STG Manage Bladder With min Assistance Outcome: Progressing   Problem: RH SKIN INTEGRITY Goal: RH STG SKIN FREE OF INFECTION/BREAKDOWN Description: Pt will be free of skin breakdown/infection with min assist while in CIR Outcome: Progressing   Problem: RH SAFETY Goal: RH STG ADHERE TO SAFETY PRECAUTIONS W/ASSISTANCE/DEVICE Description: STG Adhere to Safety Precautions With supervision Assistance/Device. Outcome: Progressing   Problem: RH KNOWLEDGE DEFICIT Goal: RH STG INCREASE KNOWLEDGE OF DIABETES Description: Pt/family will be able to demonstrate understanding of DM management with mod I assist using handouts/booklets prior to DC Outcome: Progressing Goal: RH STG INCREASE KNOWLEDGE OF HYPERTENSION Description: Pt/family will be able to demonstrate understanding of HTN management with mod I assist using handouts/booklets prior to DC Outcome: Progressing Goal: RH STG INCREASE KNOWLEDGE OF DYSPHAGIA/FLUID INTAKE Description: Pt/family will be able to demonstrate understanding of dysphagia management and aspiration risk with mod I assist using handouts/booklets prior to DC Outcome: Progressing Goal: RH STG INCREASE KNOWLEGDE OF HYPERLIPIDEMIA Description: Pt/family will be able to demonstrate understanding of HLD management with mod I assist using handouts/booklets prior to DC Outcome: Progressing Goal: RH STG INCREASE KNOWLEDGE OF STROKE PROPHYLAXIS Description: Pt/family will be able to demonstrate understanding of stroke prevention with mod I assist using handouts/booklets prior to DC Outcome: Progressing

## 2019-12-11 NOTE — Progress Notes (Signed)
Physical Therapy Session Note  Patient Details  Name: Krystal Little MRN: [id number] Date of Birth: Dec 14, 1955  Today's Date: 12/11/2019 PT Individual Time: 0900-0958 PT Individual Time Calculation (min): 58 min   Short Term Goals: Week 3:  PT Short Term Goal 1 (Week 3): Patient to be able to complete all bed mobility with min assist PT Short Term Goal 2 (Week 3): Patient to be able to complete all bed-chair transfers with MinAx1 PT Short Term Goal 3 (Week 3): Patient to be able to gait train at least 31f with hemiwalker and Min-ModA of 1 PT Short Term Goal 4 (Week 3): Paitent to be able to self propel WC at least 784fwiht no more than MinA  Skilled Therapeutic Interventions/Progress Updates:    Patient received in bed, pleasant and willing to participate in session. Able to get to EOB with mostly MinA, really just needed ModA to pivot L hip to EOB, then able to use sliding board to scoot into WC with ModA. ModA to stand at sink so therapist could straighten shorts, then needed Min-ModA for WC propulsion approximately 7560fith R UE/LE with assist provided to reduce heavy swerve to L. Continued working on gait today with eva walker- more fatigued this morning and had a harder time with weight shifting and BLE progression even in [place] but tolerated gait training [id number] MinA/Max cues for upright posture, weight shifting, and L knee extension and extended time. Otherwise worked on cross midline reaches to the left for increased WB on L LE and to facilitate extension of L knee. [facility] needs met, seatbelt alarm active and daughter present.   Therapy Documentation Precautions:  Precautions Precautions: Fall Precaution Comments: R hemicraniectomy- skull in R abdomen, L shoulder subluxation, L lateral lean and inattention, needs helmet for OOB, L weakness Restrictions Weight Bearing Restrictions: No Pain: Pain Assessment Pain Scale: 0-10 Pain Score: 0-No  pain    Therapy/Group: Individual Therapy   [name], PN1   Supplemental Physical Therapist [facility] [phone] Rehab Office [phone] 9558/27/2021, 4:00 PM

## 2019-12-11 NOTE — Progress Notes (Signed)
Speech Language Pathology Daily Session Note  Patient Details  Name: Krystal Little MRN: [id number] Date of Birth: 1956-07-16  Today's Date: 12/11/2019 SLP Individual Time: 1030-1100 SLP Individual Time Calculation (min): 30 min  Short Term Goals: Week 3: SLP Short Term Goal 1 (Week 3): Patient will consume current diet with minimal overt s/s of aspiration with supervision level verbal cues for use of swallowing compensatory strategies. SLP Short Term Goal 2 (Week 3): Pt will consume therapeutic trials of dys 3 textures with min cues to clear solids from the oral cavity and minimal overt s/s of aspiration over 3 consecutive sessions prior to advancement. SLP Short Term Goal 3 (Week 3): Pt will locate items needed for functional tasks at midline in >75% of opportunities with min assist multimodal cues. SLP Short Term Goal 4 (Week 3): Pt will sustain her attention to basic, familiar tasks for 15 minute intervals with min cues needed for redirection. SLP Short Term Goal 5 (Week 3): Pt will complete basic, familiar tasks with min assist multimodal cues for functional problem solving SLP Short Term Goal 6 (Week 3): Pt will utilize an increased vocal intensity and slow rate of speech to achieve intelligibility at the phrase/short sentence level with supervision assist verbal cues to achieve 90% intelligibility.  Skilled Therapeutic Interventions: Pt was seen for skilled ST targeting speech and family education/training with daughter. SLP introduced EMST device to facilitated increased breath support for more efficient use of increased vocal intensity. Pt performed 5 sets of 5 EMST exercises with device set to 8 cm H2O resistance with self perceived difficulty rating of 7 out of 10. She required Moderate verbal and visual cueing for accuracy in use of the device. Instructions and handout provided with recommendation to complete 5 sets of 5 each day. SLP also provided training with pt's daughter Krystal Little)  regarding swallow precautions in order for her to be approved to provide supervision during meals when she is here at mealtime. Handout also provided to daughter to reinforce strategies and key considerations, posted near safety plan. Pt became increasingly fatigued throughout session, unable to keep her head up or be appropriately engaged, therefore session ended 15 mins early. Pt left relaxing in chair with alarm set and needs within reach, daughter still present. Continue per current plan of care.        Pain Pain Assessment Pain Scale: 0-10 Pain Score: 0-No pain  Therapy/Group: Individual Therapy  [name] 12/11/2019, 7:05 AM

## 2019-12-11 NOTE — Progress Notes (Signed)
Physical Therapy Session Note  Patient Details  Name: Krystal Little MRN: [id number] Date of Birth: 06-26-1956  Today's Date: 12/11/2019 PT Individual Time: 2947-6546 PT Individual Time Calculation (min): 30 min   Short Term Goals: Week 3:  PT Short Term Goal 1 (Week 3): Patient to be able to complete all bed mobility with min assist PT Short Term Goal 2 (Week 3): Patient to be able to complete all bed-chair transfers with MinAx1 PT Short Term Goal 3 (Week 3): Patient to be able to gait train at least 11f with hemiwalker and Min-ModA of 1 PT Short Term Goal 4 (Week 3): Paitent to be able to self propel WC at least 768fwiht no more than MinA  Skilled Therapeutic Interventions/Progress Updates:    [facility] willing to work in session with co-treat with recreational therapist. Focused session on standing with hemiwalker and reaching to the right to facilitate R weight shift and sinking slam dunks in basketball hoop with Mod facilitation at L knee for full extension, had a harder time with full knee extension today with heavy facilitation from PT. Able to perform squat-pivot transfer to recliner with MinA to L side today. Left up in recliner with all needs met, seat belt alarm active this afternoon.   Therapy Documentation Precautions:  Precautions Precautions: Fall Precaution Comments: R hemicraniectomy- skull in R abdomen, L shoulder subluxation, L lateral lean and inattention, needs helmet for OOB, L weakness Restrictions Weight Bearing Restrictions: No Pain: Pain Assessment Pain Scale: 0-10 Pain Score: 0-No pain    Therapy/Group: Individual Therapy   KrWindell NorfolkDPT, PN1   Supplemental Physical Therapist [place]  Pager [phone] Rehab Office [phone]  12/11/2019, 3:38 PM

## 2019-12-11 NOTE — Progress Notes (Signed)
Recreational Therapy Session Note  Patient Details  Name: Krystal Little MRN: [id number] Date of Birth: 02/21/1956 Today's Date: 12/11/2019  Pain: c/o congestion/wet cough, requesting Robitussin, nursing made aware. Skilled Therapeutic Interventions/Progress Updates: Session focused on activity tolerance, visual scanning and dynamic standing balance during co-treat with PT.  Pt stood for modified basketball task- slam dunks with Mod assist.  Pt required increased verbal cues today and only able to maintain balance for 20-30 seconds.  Returned to the room and transferred to recliner with min assist squat pivot. Therapy/Group: Co-Treatment Pricella Gaugh 12/11/2019, 4:21 PM

## 2019-12-11 NOTE — Progress Notes (Signed)
[place] PHYSICAL MEDICINE & REHABILITATION PROGRESS NOTE  Subjective/Complaints: Patient seen sitting up in bed this morning, eating breakfast.  She states she slept well overnight.  She has several questions regarding multiple medications.  ROS: Denies CP, SOB, N/V/D  Objective: Vital Signs: Blood pressure (!) 155/76, pulse (!) 109, temperature 98.1 F (36.7 C), resp. rate 18, height 5\' 4"  (1.626 m), weight 61.7 kg, SpO2 97 %. No results found. No results for input(s): WBC, HGB, HCT, PLT in the last 72 hours. No results for input(s): NA, K, CL, CO2, GLUCOSE, BUN, CREATININE, CALCIUM in the last 72 hours.  Physical Exam: BP (!) 155/76   Pulse (!) 109   Temp 98.1 F (36.7 C)   Resp 18   Ht 5\' 4"  (1.626 m)   Wt 61.7 kg   SpO2 97%   BMI 23.35 kg/m   Constitutional: No distress . Vital signs reviewed. HENT: Right craniectomy site C/D/I Eyes: EOMI. No discharge. Cardiovascular: No JVD. Respiratory: Normal effort.  No stridor. GI: Non-distended. Bone flap right lower quadrant C/D/I Skin: Warm and dry.  Intact. Psych: Flat. Musc: No edema in extremities.  No tenderness in extremities. Neuro: Alert Motor: RUE: 4/5 proximal distal RLE: 4/5 proximal distal LUE: 0/5 proximal distal, unchanged LLE: Hip flexion, knee extension, ankle dorsiflexion 2+-3 -/5, unchanged Left facial weakness  Assessment/Plan: 1. 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  Physiatrist is providing close team supervision and 24 hour management of active medical problems listed below.  Physiatrist and rehab team continue to assess barriers to discharge/monitor patient progress toward functional and medical goals  Care Tool:  Bathing    Body parts  bathed by patient: Left arm, Chest, Abdomen, Front perineal area, Right upper leg, Left upper leg, Face, Right lower leg, Left lower leg   Body parts bathed by helper: Buttocks, Right arm     Bathing assist Assist Level: Moderate Assistance - Patient 50 - 74%     Upper Body Dressing/Undressing Upper body dressing   What is the patient wearing?: Pull over shirt    Upper body assist Assist Level: Moderate Assistance - Patient 50 - 74%    Lower Body Dressing/Undressing Lower body dressing      What is the patient wearing?: Incontinence brief, Pants     Lower body assist Assist for lower body dressing: Maximal Assistance - Patient 25 - 49%     Toileting Toileting    Toileting assist Assist for toileting: 2 Helpers     Transfers Chair/bed transfer  Transfers assist     Chair/bed transfer assist level: Minimal Assistance - Patient > 75%     Locomotion Ambulation   Ambulation assist   Ambulation activity did not occur: Safety/medical concerns  Assist level: 2 helpers Assistive device: Ethelene Hal Max distance: 14 ft   Walk 10 feet activity   Assist  Walk 10 feet activity did not occur: Safety/medical concerns  Assist level: 2 helpers Assistive device: Walker-Eva   Walk 50 feet activity   Assist Walk 50 feet with 2 turns activity did not occur: Safety/medical concerns         Walk 150 feet activity   Assist Walk 150 feet activity did not occur: Safety/medical concerns(lethargy)         Walk 10 feet on uneven surface  activity  Assist Walk 10 feet on uneven surfaces activity did not occur: Safety/medical concerns(lethargy)         Wheelchair     Assist Will patient use wheelchair at discharge?: Yes Type of Wheelchair: Manual    Wheelchair assist level: Moderate Assistance - Patient 50 - 74% Max wheelchair distance: 42ft    Wheelchair 50 feet with 2 turns activity    Assist        Assist Level: Dependent - Patient 0%    Wheelchair 150 feet activity     Assist     Assist Level: Dependent - Patient 0%    Medical Problem List and Plan: 1. 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    Repeat head CT personally reviewed, and reviewed with patient, showing some improvement  Helmet for safety when OOB.  WHO/PRAFO nightly  Communicated with therapies, shoulder sling ordered.  Continue CIR 2.  Antithrombotics:  -DVT/anticoagulation:    Discussed with neurology, continue Eliquis             -antiplatelet therapy: Aspirin 325 mg daily initiated 11/17/2019 3. Pain Management: Tylenol as needed.  4. Mood: Amantadine 100 mg twice daily             -antipsychotic agents: N/A 5. Neuropsych: This patient is not fully capable of making decisions on her own behalf. 6. Skin/Wound Care: Routine skin checks 7. Fluids/Electrolytes/Nutrition: Routine in and outs.    BMP within acceptable range, except for glucose on 5/24 8. Atrial fibrillation/pacemaker.    Failed Coumadin, started Eliquis  Cardiac rate controlled.    Continue Cardizem 60 mg every 6 hours as well as Lopressor 25 mg twice daily.    Follow-up cardiology services as needed 9. Acute blood loss anemia.    Hemoglobin 11.3 on 5/24   Continue to monitor 10. Uncontrolled diabetes mellitus type 2 with hyperglycemia.  Hemoglobin A1c 9.7.    NovoLog 8 units BID, changed to 3 times daily on 5/26, increased to 10 units 3 times daily on 5/27  Lantus insulin 17 twice daily  Check blood sugars before meals and at bedtime.  Labile on 5/27 with variable p.o. intake   CBG (last 3)  Recent Labs    12/10/19 2044 12/11/19 0631 12/11/19 1136  GLUCAP 354* 171* 370*   11. Post  stroke  dysphagia.    Advance diet to D2 thins  Continue to advance as tolerated 12. Hyperlipidemia.  Lipitor 13. Essential HTN  See #8  Started lisinopril 2.5 daily on 5/16  Labile and?  Trending up on 5/27, monitor for trend 14.  Sleep disturbance:   Melatonin 3mg  HS  Improving 15.  Hypokalemia  Potassium 3.5 on 5/24 after supplementation, labs ordered for tomorrow   LOS: 21 days A FACE TO FACE EVALUATION WAS PERFORMED  Krystal Little Lorie Phenix 12/11/2019, 2:07 PM

## 2019-12-11 NOTE — Progress Notes (Addendum)
Occupational Therapy Weekly Progress Note  Patient Details  Name: Krystal Little MRN: [id number] Date of Birth: 1955/08/23  Beginning of progress report period: Dec 02, 2019 End of progress report period: Dec 11, 2019  Today's Date: 12/11/2019 OT Individual Time: 1305-1400 OT Individual Time Calculation (min): 55 min    Patient has met 2 of 4 short term goals.  Pt progressing steadily and exhibiting improved awareness, divided attention and left sided attention.  Pt does still need some cues to attend fully to left side.  Improved self righting to midline with less VCs needed.  Trace movement in periscapular musculature and shoulder noted, however distal to this pt remains flaccid. Pt needing repetitive training on hemi techniques for dressing tasks likely due to short term memory impairments. Pt exhibits improved sitting and standing balance and requires less assist during static standing at sinkside for self care tasks and during squat pivot functional transfers from max assist to mod assist.  Patient continues to demonstrate the following deficits: muscle paralysis, impaired timing and sequencing, decreased coordination and decreased motor planning, decreased visual acuity, decreased visual perceptual skills and field cut, left side neglect, decreased attention, decreased problem solving, decreased safety awareness and decreased memory and decreased sitting balance and decreased standing balance and therefore will continue to benefit from skilled OT intervention to enhance overall performance with BADL.  Patient progressing toward long term goals..  Continue plan of care.  OT Short Term Goals Week 2:  OT Short Term Goal 1 (Week 2): Pt will be able to sit at EOB with mod A of 1 to prepare for transfers. OT Short Term Goal 1 - Progress (Week 2): Progressing toward goal OT Short Term Goal 2 (Week 2): Pt will be able to sit to stand at sink with mod A of 1. OT Short Term Goal 2 - Progress (Week  2): Met OT Short Term Goal 3 (Week 2): Pt will don shirt with mod A. OT Short Term Goal 3 - Progress (Week 2): Progressing toward goal OT Short Term Goal 4 (Week 2): Pt will complete toilet transfers with max A. OT Short Term Goal 4 - Progress (Week 2): Met  Week 3:  OT Short Term Goal 1 (Week 3): Pt will be able to sit at EOB with mod A of 1 to prepare for transfers. OT Short Term Goal 2 (Week 3): Pt will don shirt with mod A. OT Short Term Goal 3 (Week 3): Pt will complete toilet transfers with min A. OT Short Term Goal 4 (Week 3): Pt will be able to sit to stand at sink with min A of 1.  Skilled Therapeutic Interventions/Progress Updates:    Pt up in w/c eating lunch with husband present. Pt needing VCs to facilitate full scanning to left to identify cup on tray multiple times.  OT noted pt had eaten food on right side of plate only.  Verbal and visual cues to bring pt's attention to left side of plate.  Pt reports she was aware of food on left side but feeling full so does not wish to finish.  Minimal spillage noted during completion of desert using standard utensils and dishware.  Pt needing visual feedback using mirror to wipe food off of left side of face.   Pt completed transfer training w/c<>EOM in preparation for toilet and shower bench transfers. Trialled stand pivot w/c to EOM using hemiwalker, however pt requiring max VCs and significant tactile cueing to shift weight and sequence BLE and  hemiwalker placement throughout.  Instruction of proper weight shifting technique to move posteriorly on mat. Pt completed weightbearing through LUE with OT manually supporting at shoulder and elbow while scanning to left and grasp cup and place on right side of mat.  Pt needing mod VCs to initiate left sided scanning and able to fade to occasional VCing to complete.  Squat pivot transfer with mod assist EOM to w/c.  Pt returned to room with seat belt alarm donned, needs in reach.    Therapy  Documentation Precautions:  Precautions Precautions: Fall Precaution Comments: R hemicraniectomy- skull in R abdomen, L shoulder subluxation, L lateral lean and inattention, needs helmet for OOB, L weakness Restrictions Weight Bearing Restrictions: No  Pain: Pain Assessment Pain Scale: 0-10 Pain Score: 0-No pain  Therapy/Group: Individual Therapy  Ezekiel Slocumb 12/11/2019, 4:29 PM

## 2019-12-12 ENCOUNTER — Inpatient Hospital Stay (HOSPITAL_COMMUNITY): Payer: Medicare Other | Admitting: Occupational Therapy

## 2019-12-12 ENCOUNTER — Inpatient Hospital Stay (HOSPITAL_COMMUNITY): Payer: Medicare Other | Admitting: Speech Pathology

## 2019-12-12 ENCOUNTER — Inpatient Hospital Stay (HOSPITAL_COMMUNITY): Payer: Medicare Other

## 2019-12-12 LAB — BASIC METABOLIC PANEL
Anion gap: 11 (ref 5–15)
BUN: 11 mg/dL (ref 8–23)
CO2: 27 mmol/L (ref 22–32)
Calcium: 8.9 mg/dL (ref 8.9–10.3)
Chloride: 100 mmol/L (ref 98–111)
Creatinine, Ser: 0.58 mg/dL (ref 0.44–1.00)
GFR calc Af Amer: >60 mL/min (ref >60)
GFR calc non Af Amer: >60 mL/min (ref >60)
Glucose, Bld: 100 mg/dL — ABNORMAL HIGH (ref 70–99)
Potassium: 3.7 mmol/L (ref 3.5–5.1)
Sodium: 138 mmol/L (ref 135–145)

## 2019-12-12 LAB — GLUCOSE, CAPILLARY
Glucose-Capillary: 95 mg/dL (ref 70–99)
Glucose-Capillary: 311 mg/dL — ABNORMAL HIGH (ref 70–99)
Glucose-Capillary: 256 mg/dL — ABNORMAL HIGH (ref 70–99)
Glucose-Capillary: 106 mg/dL — ABNORMAL HIGH (ref 70–99)

## 2019-12-12 LAB — CBC WITH DIFFERENTIAL/PLATELET
Abs Immature Granulocytes: 0.04 10*3/uL (ref 0.00–0.07)
Basophils Absolute: 0 10*3/uL (ref 0.0–0.1)
Basophils Relative: 0 %
Eosinophils Absolute: 0 10*3/uL (ref 0.0–0.5)
Eosinophils Relative: 0 %
HCT: 32.1 % — ABNORMAL LOW (ref 36.0–46.0)
Hemoglobin: 10.2 g/dL — ABNORMAL LOW (ref 12.0–15.0)
Immature Granulocytes: 0 %
Lymphocytes Relative: 12 %
Lymphs Abs: 1.1 10*3/uL (ref 0.7–4.0)
MCH: 29.3 pg (ref 26.0–34.0)
MCHC: 31.8 g/dL (ref 30.0–36.0)
MCV: 92.2 fL (ref 80.0–100.0)
Monocytes Absolute: 1.4 10*3/uL — ABNORMAL HIGH (ref 0.1–1.0)
Monocytes Relative: 14 %
Neutro Abs: 7.2 10*3/uL (ref 1.7–7.7)
Neutrophils Relative %: 74 %
Platelets: 230 10*3/uL (ref 150–400)
RBC: 3.48 MIL/uL — ABNORMAL LOW (ref 3.87–5.11)
RDW: 15 % (ref 11.5–15.5)
WBC: 9.8 10*3/uL (ref 4.0–10.5)
nRBC: 0 % (ref 0.0–0.2)

## 2019-12-12 MED ORDER — LISINOPRIL 2.5 MG PO TABS
2.5000 mg | ORAL_TABLET | Freq: Once | ORAL | Status: AC
  Administered 2019-12-12: 2.5 mg via ORAL
  Filled 2019-12-12: qty 1

## 2019-12-12 MED ORDER — INSULIN GLARGINE 100 UNIT/ML ~~LOC~~ SOLN
15.0000 [IU] | Freq: Two times a day (BID) | SUBCUTANEOUS | Status: DC
  Administered 2019-12-12 – 2019-12-14 (×4): 15 [IU] via SUBCUTANEOUS
  Filled 2019-12-12 (×5): qty 0.15

## 2019-12-12 MED ORDER — LISINOPRIL 5 MG PO TABS
5.0000 mg | ORAL_TABLET | Freq: Every day | ORAL | Status: DC
  Administered 2019-12-13 – 2019-12-14 (×2): 5 mg via ORAL
  Filled 2019-12-12 (×2): qty 1

## 2019-12-12 MED ORDER — INSULIN ASPART 100 UNIT/ML ~~LOC~~ SOLN
12.0000 [IU] | Freq: Three times a day (TID) | SUBCUTANEOUS | Status: DC
  Administered 2019-12-12 – 2019-12-13 (×3): 12 [IU] via SUBCUTANEOUS

## 2019-12-12 MED ORDER — APIXABAN 5 MG PO TABS: 5.0000 mg | ORAL_TABLET | Freq: Two times a day (BID) | ORAL | Status: DC

## 2019-12-12 NOTE — Plan of Care (Signed)
  Problem: Consults Goal: RH STROKE PATIENT EDUCATION Description: See Patient Education module for education specifics  Outcome: Progressing Goal: Nutrition Consult-if indicated Outcome: Progressing   Problem: RH BLADDER ELIMINATION Goal: RH STG MANAGE BLADDER WITH ASSISTANCE Description: STG Manage Bladder With min Assistance Outcome: Progressing   Problem: RH SKIN INTEGRITY Goal: RH STG SKIN FREE OF INFECTION/BREAKDOWN Description: Pt will be free of skin breakdown/infection with min assist while in CIR Outcome: Progressing   Problem: RH SAFETY Goal: RH STG ADHERE TO SAFETY PRECAUTIONS W/ASSISTANCE/DEVICE Description: STG Adhere to Safety Precautions With supervision Assistance/Device. Outcome: Progressing   Problem: RH KNOWLEDGE DEFICIT Goal: RH STG INCREASE KNOWLEDGE OF DIABETES Description: Pt/family will be able to demonstrate understanding of DM management with mod I assist using handouts/booklets prior to DC Outcome: Progressing Goal: RH STG INCREASE KNOWLEDGE OF HYPERTENSION Description: Pt/family will be able to demonstrate understanding of HTN management with mod I assist using handouts/booklets prior to DC Outcome: Progressing Goal: RH STG INCREASE KNOWLEDGE OF DYSPHAGIA/FLUID INTAKE Description: Pt/family will be able to demonstrate understanding of dysphagia management and aspiration risk with mod I assist using handouts/booklets prior to DC Outcome: Progressing Goal: RH STG INCREASE KNOWLEGDE OF HYPERLIPIDEMIA Description: Pt/family will be able to demonstrate understanding of HLD management with mod I assist using handouts/booklets prior to DC Outcome: Progressing Goal: RH STG INCREASE KNOWLEDGE OF STROKE PROPHYLAXIS Description: Pt/family will be able to demonstrate understanding of stroke prevention with mod I assist using handouts/booklets prior to DC Outcome: Progressing

## 2019-12-12 NOTE — Progress Notes (Signed)
[place] PHYSICAL MEDICINE & REHABILITATION PROGRESS NOTE  Subjective/Complaints: Patient seen sitting up at the edge of the bed this AM, working with therapies. Difficulty with sitting balance.  She states she slept well overnight.  He denies headaches, notes some pressure when she coughs. She states she was able to wash her hair yesterday and feels better.   ROS: Denies CP, SOB, N/V/D  Objective: Vital Signs: Blood pressure (!) 154/130, pulse 81, temperature 98.9 F (37.2 C), temperature source Oral, resp. rate 16, height 5\' 4"  (1.626 m), weight 62.7 kg, SpO2 97 %. No results found. No results for input(s): WBC, HGB, HCT, PLT in the last 72 hours. Recent Labs    12/12/19 0726  NA 138  K 3.7  CL 100  CO2 27  GLUCOSE 100*  BUN 11  CREATININE 0.58  CALCIUM 8.9    Physical Exam: BP (!) 154/130   Pulse 81   Temp 98.9 F (37.2 C) (Oral)   Resp 16   Ht 5\' 4"  (1.626 m)   Wt 62.7 kg   SpO2 97%   BMI 23.73 kg/m   Constitutional: No distress . Vital signs reviewed. HENT: Right craniectomy site C/D/I. Eyes: EOMI. No discharge. Cardiovascular: No JVD. Respiratory: Normal effort.  No stridor. GI: Non-distended. Bone flap in RLQ C/D/I Skin: Warm and dry.  Intact. Psych: Flat. Musc: No edema in extremities.  No tenderness in extremities. Neuro: Alert Motor: RUE: 4/5 proximal distal RLE: 4/5 proximal distal LUE: 1+/5 shoulder abduction, distally 0/5, except for some finger flicker LLE: Hip flexion, knee extension, ankle dorsiflexion 2+-3 -/5, unchanged Left facial weakness  Assessment/Plan: 1. 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  Physiatrist is providing close team supervision and 24 hour management of active medical  problems listed below.  Physiatrist and rehab team continue to assess barriers to discharge/monitor patient progress toward functional and medical goals  Care Tool:  Bathing    Body parts bathed by patient: Left arm, Chest, Abdomen, Front perineal area, Right upper leg, Left upper leg, Face, Right lower leg, Left lower leg   Body parts bathed by helper: Buttocks, Right arm     Bathing assist Assist Level: Moderate Assistance - Patient 50 - 74%     Upper Body Dressing/Undressing Upper body dressing   What is the patient wearing?: Pull over shirt    Upper body assist Assist Level: Moderate Assistance - Patient 50 - 74%    Lower Body Dressing/Undressing Lower body dressing      What is the patient wearing?: Incontinence brief, Pants     Lower body assist Assist for lower body dressing: Maximal Assistance - Patient 25 - 49%     Toileting Toileting    Toileting assist Assist for toileting: 2 Helpers     Transfers Chair/bed transfer  Transfers assist     Chair/bed transfer assist level: Moderate Assistance - Patient 50 - 74%     Locomotion Ambulation   Ambulation assist   Ambulation activity did not occur: Safety/medical concerns  Assist level: 2 helpers Assistive device: Ethelene Hal Max distance: 69ft   Walk 10 feet activity   Assist  Walk 10 feet activity did not occur: Safety/medical concerns  Assist level: 2 helpers Assistive device: Walker-Eva   Walk 50 feet activity   Assist Walk 50 feet with 2 turns  activity did not occur: Safety/medical concerns         Walk 150 feet activity   Assist Walk 150 feet activity did not occur: Safety/medical concerns(lethargy)         Walk 10 feet on uneven surface  activity   Assist Walk 10 feet on uneven surfaces activity did not occur: Safety/medical concerns(lethargy)         Wheelchair     Assist Will patient use wheelchair at discharge?: Yes Type of Wheelchair: Manual     Wheelchair assist level: Moderate Assistance - Patient 50 - 74% Max wheelchair distance: 21ft    Wheelchair 50 feet with 2 turns activity    Assist        Assist Level: Moderate Assistance - Patient 50 - 74%   Wheelchair 150 feet activity     Assist     Assist Level: Dependent - Patient 0%    Medical Problem List and Plan: 1. 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    Repeat head CT personally reviewed, and reviewed with patient, showing some improvement  Helmet for safety when OOB.  WHO/PRAFO nightly  Communicated with therapies, shoulder sling ordered.  Continue CIR 2.  Antithrombotics:  -DVT/anticoagulation:    Discussed with neurology, continue Eliquis             -antiplatelet therapy: Aspirin 325 mg daily initiated 11/17/2019 3. Pain Management: Tylenol as needed.  4. Mood: Amantadine 100 mg twice daily             -antipsychotic agents: N/A 5. Neuropsych: This patient is not fully capable of making decisions on her own behalf. 6. Skin/Wound Care: Routine skin checks 7. Fluids/Electrolytes/Nutrition: Routine in and outs.    BMP within acceptable range on 5/28 8. Atrial fibrillation/pacemaker.    Failed Coumadin, started Eliquis  Cardiac rate controlled.    Continue Cardizem 60 mg every 6 hours as well as Lopressor 25 mg twice daily.    Follow-up cardiology services as needed 9. Acute blood loss anemia.    Hemoglobin 11.3 on 5/24   Continue to monitor 10. Uncontrolled diabetes mellitus type 2 with hyperglycemia.  Hemoglobin A1c 9.7.    NovoLog 8 units BID, changed to 3 times daily on 5/26, increased to 10 units 3 times daily on 5/27, increased to 12 TID on 5/28  Lantus  insulin 17 twice daily, decreased to 15 BID on 5/28  Check blood sugars before meals and at bedtime.  Very Labile on 5/28 with variable PO/supplement intake   CBG (last 3)  Recent Labs    12/11/19 1623 12/11/19 2102 12/12/19 0557  GLUCAP 289* 121* 95   11. Post stroke  dysphagia.    Advance diet to D2 thins  Continue to advance as tolerated 12. Hyperlipidemia.  Lipitor 13. Essential HTN  See #8  Started lisinopril 2.5 daily on 5/16, increased to 5 on 5/28 14.  Sleep disturbance:   Melatonin 3mg  HS  Improving 15.  Hypokalemia  Potassium 3.7 on 5/28   LOS: 22 days A FACE TO FACE EVALUATION WAS PERFORMED  Diontre Harps Lorie Phenix 12/12/2019, 10:06 AM

## 2019-12-12 NOTE — Progress Notes (Signed)
Patient ID: Krystal Little, female   DOB: [birth year], 63 y.o.   MRN: [id number]   Sw followed up with family/patient for any questions/concerns we transition into weekend, no concerns. Family able to come in for education June 10th

## 2019-12-12 NOTE — Progress Notes (Signed)
Order given for O2 @ 2 L [place]. No signs of distress noted at this time,

## 2019-12-12 NOTE — Progress Notes (Signed)
Pt has yellow MEWS due to HR and RR. Vitals rechecked. Pt had chest xray and EKG completed. Dr. Ella Bodo made aware. Pt denies CP/SOB at this time.

## 2019-12-12 NOTE — Progress Notes (Signed)
Patient had a MEWS of yellow. First set of VS taken at 1700. VS taken again at 17:45. On call MD notified. Ordered EKG. Will continue to monitor. Amanda Cockayne, LPN

## 2019-12-12 NOTE — Progress Notes (Signed)
Physical Therapy Session Note  Patient Details  Name: Krystal Little MRN: [id number] Date of Birth: 08-18-1955  Today's Date: 12/12/2019 PT Individual Time: 0800-0857 PT Individual Time Calculation (min): 57 min   Short Term Goals: Week 3:  PT Short Term Goal 1 (Week 3): Patient to be able to complete all bed mobility with min assist PT Short Term Goal 2 (Week 3): Patient to be able to complete all bed-chair transfers with MinAx1 PT Short Term Goal 3 (Week 3): Patient to be able to gait train at least 45f with hemiwalker and Min-ModA of 1 PT Short Term Goal 4 (Week 3): Paitent to be able to self propel WC at least [id number] no more than MinA  Skilled Therapeutic Interventions/Progress Updates:    Patient received in bed, BP found to be 126/74 in supine. Generally required ModA for functional bed mobility and squat pivot transfers today, able to roll to L side with MinA but still needs ModA for rolling to R. TotalA for donning shoes/AFO and helmet. Focused session on gait training in lite gait today- tolerated gait training [id number] lite gait with ModA/Max cues for weight shifting and step progression in lite gait, also facilitation for upright posture and reduced trunk rotation with gait. Able to progress L LE with AFO and no additional assist at that LE however continues to require heavy facilitation at quad for knee extension, able to achieve full knee extension 3-4 times during session today. [facility], all needs otherwise met this morning.   Therapy Documentation Precautions:  Precautions Precautions: Fall Precaution Comments: R hemicraniectomy- skull in R abdomen, L shoulder subluxation, L lateral lean and inattention, needs helmet for OOB, L weakness Restrictions Weight Bearing Restrictions: No   Pain: Pain Assessment Pain Scale: 0-10 Pain Score: 0-No pain    Therapy/Group: Individual Therapy  KriWindell NorfolkPT, PN1   Supplemental Physical  Therapist ConHarrison Pager [phone] Re[facility] Office [phone]/28/2021, 12:16 PM

## 2019-12-12 NOTE — Progress Notes (Signed)
Occupational Therapy Session Note  Patient Details  Name: Krystal Little MRN: [id number] Date of Birth: [birth year]  Today's Date: 12/12/2019 OT Individual Time: 1303-1400 and 1415-1450 OT Individual Time Calculation (min): 57 min and 35 min   Short Term Goals: Week 1:  OT Short Term Goal 1 (Week 1): Pt will be able to actively turn her head to the left with min Cues. OT Short Term Goal 1 - Progress (Week 1): Met OT Short Term Goal 2 (Week 1): Pt will be able to wash her L arm with mod A. OT Short Term Goal 2 - Progress (Week 1): Met OT Short Term Goal 3 (Week 1): Pt will be able to sit to EOB with mod A of 1. OT Short Term Goal 3 - Progress (Week 1): Progressing toward goal OT Short Term Goal 4 (Week 1): Pt will be able to rise to stand in stedy lift with max A of 1. OT Short Term Goal 4 - Progress (Week 1): Progressing toward goal OT Short Term Goal 5 (Week 1): Pt will be able to self feed with min A. OT Short Term Goal 5 - Progress (Week 1): Met Week 2:  OT Short Term Goal 1 (Week 2): Pt will be able to sit at EOB with mod A of 1 to prepare for transfers. OT Short Term Goal 1 - Progress (Week 2): Progressing toward goal OT Short Term Goal 2 (Week 2): Pt will be able to sit to stand at sink with mod A of 1. OT Short Term Goal 2 - Progress (Week 2): Met OT Short Term Goal 3 (Week 2): Pt will don shirt with mod A. OT Short Term Goal 3 - Progress (Week 2): Progressing toward goal OT Short Term Goal 4 (Week 2): Pt will complete toilet transfers with max A. OT Short Term Goal 4 - Progress (Week 2): Met Week 3:  OT Short Term Goal 1 (Week 3): Pt will be able to sit at EOB with mod A of 1 to prepare for transfers. OT Short Term Goal 2 (Week 3): Pt will don shirt with mod A. OT Short Term Goal 3 (Week 3): Pt will complete toilet transfers with min A. OT Short Term Goal 4 (Week 3): Pt will be able to sit to stand at sink with min A of 1.  Skilled Therapeutic Interventions/Progress Updates:     Visit 1:  Pain: no c/o pain  Pt seen this session with a focus on standing balance, attention to left side and postural control during self care. Pt received in wc and opted to bathe at sink today.  She continues to need mod A with UB self care and max with LB self care.  Positioned at sink to complete grooming and upper body self care in sitting with occasional A for sit balance and guidance with hemiplegic techniques.  Sit to stand at sink with CGA and them min to mod to maintain stand with support at LLE.  Pt was not able to let go with R hand from hemiwalker to wash bottom but she was able to do so briefly to pull pants over R side of hips.   Obtained a Give Mohr sling for pt to trial as she is now standing more often with hemiwalker and initiating walking with PT. Sling adjusted and educated pt and family on use of sling.  estim placed on L shoulder for unattended estim for 45 minutes with saebo stim one to reduce subluxation.  Saebo Stim One 330 pulse width 35 Hz pulse rate On 8 sec/ off 8 sec Ramp up/ down 2 sec Symmetrical Biphasic wave form  Max intensity 118m at 500 Ohm load Pt resting in w/c with family present, belt alarm on, lap tray in place.   Visit 2:  Pain: no c/o pain  Pt received in w/c and declined need to toilet even with several prompts to try.  Plan was to transfer pt to bed to work on LUE NMR exercises from supine.  Pt completed squat pivot with mod A and moved to supine with mod A. Helmet and shoes removed and then pt stated "actually I do need to go to the bathroom".  Used stedy lift [facility], toileting, and then back to bed with min A as pt is able to pull self into standing well. Max A with cleansing and clothing management.  Pt was able to void B and B.   Pt positioned in bed with pillows supporting LUE, all needs met, bed alarm set.    Therapy Documentation Precautions:  Precautions Precautions: Fall Precaution Comments: R  hemicraniectomy- skull in R abdomen, L shoulder subluxation, L lateral lean and inattention, needs helmet for OOB, L weakness Restrictions Weight Bearing Restrictions: No    Vital Signs: Therapy Vitals Pulse Rate: 81 BP: (!) 154/130 Pain: Pain Assessment Pain Scale: 0-10 Pain Score: 0-No pain   Therapy/Group: Individual Therapy  SAdvance5/28/2021, 10:12 AM

## 2019-12-12 NOTE — Progress Notes (Signed)
Occupational Therapy Session Note  Patient Details  Name: Krystal Little MRN: [id number] Date of Birth: Mar 27, 1956  Today's Date: 12/12/2019 OT Individual Time: 1303-1400 OT Individual Time Calculation (min): 57 min    Short Term Goals: Week 3:  OT Short Term Goal 1 (Week 3): Pt will be able to sit at EOB with mod A of 1 to prepare for transfers. OT Short Term Goal 2 (Week 3): Pt will don shirt with mod A. OT Short Term Goal 3 (Week 3): Pt will complete toilet transfers with min A. OT Short Term Goal 4 (Week 3): Pt will be able to sit to stand at sink with min A of 1.  Skilled Therapeutic Interventions/Progress Updates:  Patient met seated in wc in agreement with OT treatment session with focus on NMR, sit to stand transfers, and static standing balance in prep for BADLs at sink level as detailed below. Total A for wc transport to therapy gym. SB transfer wc <> mat table with cueing for hand placement after positioning of board. Patient completed sit <> stand x8 with Give Mohr sling on LUE and focus on static standing balance, orientation to midline, and LLE strengthening in prep for BADLs. NMR with use of Estim (Saebo unit) on wrist extensors for 15 minutes in prep for functional use of LUE. Session concluded with patient seated in wc with call bell within reach, belt alarm activated, and all needs met.   Saebo Stim One 330 pulse width 35 Hz pulse rate On 8 sec/ off 8 sec Ramp up/ down 2 sec Symmetrical Biphasic wave form  Max intensity 113m at 500 Ohm load  Therapy Documentation Precautions:  Precautions Precautions: Fall Precaution Comments: R hemicraniectomy- skull in R abdomen, L shoulder subluxation, L lateral lean and inattention, needs helmet for OOB, L weakness Restrictions Weight Bearing Restrictions: No General:    Therapy/Group: Individual Therapy  [name] [name] 12/12/2019, 8:39 AM

## 2019-12-12 NOTE — Progress Notes (Addendum)
  Called by RN regarding tachypnea without resp distress.  Also with tachycardia.  Has hx of chronic Afib with RVR.  CXR demonstrates large left pleural effusion which is likely cause of tachypnea.  Will need thoracentesis,  discussed with pulmonary/CCM Dr Lucile Shutters.  Pt not requiring ICU setting at this time.  Will order O2 , will  need to be off Eliquis for procedure.  Ordered am dose of Eliquis to be held.  Pulmonary will see in am for consult and  probable thoracentesis Will need to be at bedside tx for tomorrow .

## 2019-12-12 NOTE — Progress Notes (Signed)
Speech Language Pathology Weekly Progress and Session Note  Patient Details  Name: Krystal Little MRN: [id number] Date of Birth: 1955/09/28  Beginning of progress report period: Dec 05, 2019 End of progress report period: Dec 12, 2019  Today's Date: 12/12/2019 SLP Individual Time: 1010-1045 SLP Individual Time Calculation (min): 35 min  Short Term Goals: Week 3: SLP Short Term Goal 1 (Week 3): Patient will consume current diet with minimal overt s/s of aspiration with supervision level verbal cues for use of swallowing compensatory strategies. SLP Short Term Goal 1 - Progress (Week 3): Progressing toward goal SLP Short Term Goal 2 (Week 3): Pt will consume therapeutic trials of dys 3 textures with min cues to clear solids from the oral cavity and minimal overt s/s of aspiration over 3 consecutive sessions prior to advancement. SLP Short Term Goal 2 - Progress (Week 3): Progressing toward goal SLP Short Term Goal 3 (Week 3): Pt will locate items needed for functional tasks at midline in >75% of opportunities with min assist multimodal cues. SLP Short Term Goal 3 - Progress (Week 3): Progressing toward goal SLP Short Term Goal 4 (Week 3): Pt will sustain her attention to basic, familiar tasks for 15 minute intervals with min cues needed for redirection. SLP Short Term Goal 4 - Progress (Week 3): Met SLP Short Term Goal 5 (Week 3): Pt will complete basic, familiar tasks with min assist multimodal cues for functional problem solving SLP Short Term Goal 5 - Progress (Week 3): Met SLP Short Term Goal 6 (Week 3): Pt will utilize an increased vocal intensity and slow rate of speech to achieve intelligibility at the phrase/short sentence level with supervision assist verbal cues to achieve 90% intelligibility. SLP Short Term Goal 6 - Progress (Week 3): Progressing toward goal    New Short Term Goals: Week 4: SLP Short Term Goal 1 (Week 4): Patient will consume current diet with minimal overt s/s  of aspiration with supervision level verbal cues for use of swallowing compensatory strategies. SLP Short Term Goal 2 (Week 4): Pt will consume trials of dys 3 textures with min cues to clear solids from the oral cavity and minimal overt s/s of aspiration over 3 consecutive sessions prior to advancement. SLP Short Term Goal 3 (Week 4): Pt will locate items needed for functional tasks at midline in >75% of opportunities with min assist multimodal cues. SLP Short Term Goal 4 (Week 4): Pt will sustain her attention to basic, familiar tasks for 20 minute intervals with min cues needed for redirection. SLP Short Term Goal 5 (Week 4): Pt will complete mildly complex tasks with min assist multimodal cues for functional problem solving SLP Short Term Goal 6 (Week 4): Pt will utilize an increased vocal intensity and slow rate of speech to achieve intelligibility at the phrase/short sentence level with supervision assist verbal cues to achieve 90% intelligibility.  Weekly Progress Updates: Pt has made slow but functional gains this reporting period and met 2 out of 6 short term goals. Pt is currently Min-Mod assist for basic tasks due to cognitive impairments impacting her sustained attention, problem solving, visual scanning, and error awareness. Pt is consuming dysphagia 2 diet with thin liquids, and has been working toward solid advancement, however due to prolonged mastication and oral residue, has not yet advanced. She continues to required Min A for use of increased vocal intensity for speech ineligibility at the phrase/sentence level. EMST interventions have been initiated in hopes to increase her breath support and aid  in more efficient use of increased vocal intensity and reduce congestion. Pt and family education is ongoing. Pt would continue to benefit from skilled ST while inpatient in order to maximize functional independence and reduce burden of care prior to discharge. Anticipate that pt will need 24/7  supervision at discharge in addition to [place] follow up at next level of care.      Intensity: Minumum of 1-2 x/day, 30 to 90 minutes Frequency: 3 to 5 out of 7 days Duration/Length of Stay: 01/01/20 Treatment/Interventions: Cognitive remediation/compensation;Cueing hierarchy;Patient/family education;Environmental controls;Dysphagia/aspiration precaution training;Internal/external aids;Speech/Language facilitation;Functional tasks;Therapeutic Activities   Daily Session  Skilled Therapeutic Interventions: Pt was seen for skilled ST targeting dysphagia. Pt's daughter present throughout session. Pt expressed desire to focus on dysphagia interventions upon SLP's arrival, as she had not gotten a change to consume breakfast yet this morning. Pt consumed snack and breakfast tray containing current diet (dys 2) solids and thin liquids. No overt s/sx aspiration observed throughout intake. Mastication was slightly prolonged but full oral clearance achieved. During upgraded trials of dysphagia 3 (mech soft) solids, pt required increased Mod A cueing for clearance of Min left buccal pocketing. Pt politely requested to cease upgraded trials and focus on purees and Dys 2 items from breakfast tray. Recommend continue current diet. Pt left sitting in chair with alarm set and needs within reach, daughter still present and taking over full supervision of PO consumption. Continue per current plan of care.      Pain Pain Assessment Pain Scale: 0-10 Pain Score: 0-No pain  Therapy/Group: Individual Therapy  [name] 12/12/2019, 7:08 AM

## 2019-12-12 NOTE — Progress Notes (Signed)
[facility] Progress Note Patient Name: Krystal Little DOB: 24-Apr-1956 MRN: [id number]   Date of Service  12/12/2019  HPI/Events of Note  Physical medicine and rehabilitation physician Dr. Lynford Citizen requesting routine consult for rehab patient with some shortness of breath and a CXR that shows a pleural effusion, per Dr. Read Drivers patient without concern for acute decompensation and may be seen as a routine consult in the a.m. Pt is on Eliquis.  eICU Interventions  I recommended that MD put in a consult request in Epic and specify that it is non-urgent and can be seen in a.m. I also asked him to hold the a.m. Eliquis dose in case the patient needs a thoracentesis. I added patient to the PCCM list.         Frederik Pear 12/12/2019, 9:41 PM

## 2019-12-13 ENCOUNTER — Inpatient Hospital Stay (HOSPITAL_COMMUNITY): Payer: Medicare Other

## 2019-12-13 DIAGNOSIS — J90 Pleural effusion, not elsewhere classified: Secondary | ICD-10-CM

## 2019-12-13 LAB — BODY FLUID CELL COUNT WITH DIFFERENTIAL
Eos, Fluid: 0 %
Lymphs, Fluid: 20 %
Monocyte-Macrophage-Serous Fluid: 2 % — ABNORMAL LOW (ref 50–90)
Neutrophil Count, Fluid: 78 % — ABNORMAL HIGH (ref 0–25)
Total Nucleated Cell Count, Fluid: 8667 cu mm — ABNORMAL HIGH (ref 0–1000)

## 2019-12-13 LAB — GLUCOSE, CAPILLARY
Glucose-Capillary: 80 mg/dL (ref 70–99)
Glucose-Capillary: 138 mg/dL — ABNORMAL HIGH (ref 70–99)
Glucose-Capillary: 261 mg/dL — ABNORMAL HIGH (ref 70–99)
Glucose-Capillary: 286 mg/dL — ABNORMAL HIGH (ref 70–99)

## 2019-12-13 LAB — PROTEIN, PLEURAL OR PERITONEAL FLUID: Total protein, fluid: 4 g/dL

## 2019-12-13 LAB — GLUCOSE, PLEURAL OR PERITONEAL FLUID: Glucose, Fluid: 92 mg/dL

## 2019-12-13 LAB — LACTATE DEHYDROGENASE, PLEURAL OR PERITONEAL FLUID: LD, Fluid: 182 U/L — ABNORMAL HIGH (ref 3–23)

## 2019-12-13 LAB — HEMATOCRIT: HCT: 36.9 % (ref 36.0–46.0)

## 2019-12-13 MED ORDER — TRAMADOL HCL 50 MG PO TABS
50.0000 mg | ORAL_TABLET | Freq: Four times a day (QID) | ORAL | Status: DC | PRN
  Administered 2019-12-13 – 2019-12-14 (×2): 50 mg via ORAL
  Filled 2019-12-13 (×2): qty 1

## 2019-12-13 NOTE — Progress Notes (Signed)
[facility] PHYSICAL MEDICINE & REHABILITATION PROGRESS NOTE  Subjective/Complaints:  Pt is awake and alert , has L neglect as expected  No c/o SOB but is aware her HR is up No cough, no fever or chills   ROS: Denies CP, SOB, N/V/D  Objective: Vital Signs: Blood pressure 110/69, pulse 63, temperature 98.7 F (37.1 C), temperature source Oral, resp. rate (!) 23, height 5\' 4"  (1.626 m), weight 63.9 kg, SpO2 98 %. DG Chest 2 View  Result Date: 12/12/2019 CLINICAL DATA:  Tachypnea EXAM: CHEST - 2 VIEW COMPARISON:  11/15/2019 FINDINGS: Post sternotomy changes. Right-sided pacing device as before. Right lung is grossly clear. Large left-sided pleural effusion, increased compared to prior with small amount of aerated lung at the apex. Enlarged cardiomediastinal silhouette. No pneumothorax. IMPRESSION: 1. Large left-sided pleural effusion, increased compared to prior with minimal aerated lung at the left apex. 2. Cardiomegaly Electronically Signed   By: Donavan Foil M.D.   On: 12/12/2019 20:54   Recent Labs    12/12/19 2216  WBC 9.8  HGB 10.2*  HCT 32.1*  PLT 230   Recent Labs    12/12/19 0726  NA 138  K 3.7  CL 100  CO2 27  GLUCOSE 100*  BUN 11  CREATININE 0.58  CALCIUM 8.9    Physical Exam: BP 110/69 (BP Location: Right Arm)   Pulse 63   Temp 98.7 F (37.1 C) (Oral)   Resp (!) 23   Ht 5\' 4"  (1.626 m)   Wt 63.9 kg   SpO2 98%   BMI 24.18 kg/m    General: No acute distress Mood and affect are appropriate Heart: Regular rate and rhythm no rubs murmurs or extra sounds Lungs: Clear to auscultation, breathing unlabored, no rales or wheezes Abdomen: Positive bowel sounds, soft nontender to palpation, nondistended Extremities: No clubbing, cyanosis, or edema Skin: No evidence of breakdown, no evidence of rash  Neuro: Alert Motor: RUE: 4/5 proximal distal RLE: 4/5 proximal distal LUE: 1+/5 shoulder abduction, distally 0/5, except for some finger flicker LLE: Hip  flexion, knee extension, ankle dorsiflexion 2+-3 -/5, unchanged Left facial weakness  Assessment/Plan: 1. 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  Physiatrist is providing close team supervision and 24 hour management of active medical problems listed below.  Physiatrist and rehab team continue to assess barriers to discharge/monitor patient progress toward functional and medical goals  Care Tool:  Bathing    Body parts bathed by patient: Left arm, Chest, Abdomen, Front perineal area, Right upper leg, Left upper leg, Face, Right lower leg, Left lower leg   Body parts bathed by helper: Buttocks, Right arm     Bathing assist Assist Level: Moderate Assistance - Patient 50 - 74%     Upper Body Dressing/Undressing Upper body dressing   What is the patient wearing?: Pull over shirt    Upper body assist Assist Level: Moderate Assistance - Patient 50 - 74%    Lower Body Dressing/Undressing Lower body dressing      What is the patient wearing?: Incontinence brief, Pants     Lower body assist Assist for lower body dressing: Maximal Assistance - Patient 25 - 49%     Toileting Toileting    Toileting assist Assist for toileting: Maximal Assistance - Patient 25 - 49%     Transfers  Chair/bed transfer  Transfers assist     Chair/bed transfer assist level: Moderate Assistance - Patient 50 - 74%     Locomotion Ambulation   Ambulation assist   Ambulation activity did not occur: Safety/medical concerns  Assist level: 2 helpers Assistive device: Lite Gait Max distance: 69ft   Walk 10 feet activity   Assist  Walk 10 feet activity did not occur: Safety/medical concerns  Assist level: 2 helpers Assistive device: Lite Gait    Walk 50 feet activity   Assist Walk 50 feet with 2 turns activity did not occur: Safety/medical concerns         Walk 150 feet activity   Assist Walk 150 feet activity did not occur: Safety/medical concerns(lethargy)         Walk 10 feet on uneven surface  activity   Assist Walk 10 feet on uneven surfaces activity did not occur: Safety/medical concerns(lethargy)         Wheelchair     Assist Will patient use wheelchair at discharge?: Yes Type of Wheelchair: Manual    Wheelchair assist level: Moderate Assistance - Patient 50 - 74% Max wheelchair distance: 74ft    Wheelchair 50 feet with 2 turns activity    Assist        Assist Level: Moderate Assistance - Patient 50 - 74%   Wheelchair 150 feet activity     Assist     Assist Level: Dependent - Patient 0%    Medical Problem List and Plan: 1. 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    Repeat head CT personally reviewed, and reviewed with patient, showing some improvement  Helmet for safety when OOB.  WHO/PRAFO nightly  Communicated with therapies, shoulder sling ordered.  Continue CIR 2.  Antithrombotics:  -DVT/anticoagulation:    Discussed with neurology, continue Eliquis             -antiplatelet therapy: Aspirin 325 mg daily initiated 11/17/2019 3. Pain Management: Tylenol as needed.  4. Mood: Amantadine 100 mg twice daily             -antipsychotic agents: N/A 5. Neuropsych: This patient is not fully capable of making decisions on her own behalf. 6. Skin/Wound Care: Routine skin checks 7. Fluids/Electrolytes/Nutrition: Routine in and outs.    BMP within acceptable range on 5/28 8. Atrial  fibrillation/pacemaker.    Failed Coumadin, started Eliquis  Cardiac rate controlled.    Continue Cardizem 60 mg every 6 hours as well as Lopressor 25 mg twice daily.    Follow-up cardiology services as needed 9. Acute blood loss anemia.    Hemoglobin 11.3 on 5/24   Continue to monitor 10. Uncontrolled diabetes mellitus type 2 with hyperglycemia.  Hemoglobin A1c 9.7.    NovoLog 8 units BID, changed to 3 times daily on 5/26, increased to 10 units 3 times daily on 5/27, increased to 12 TID on 5/28  Lantus insulin 17 twice daily, decreased to 15 BID on 5/28  Check blood sugars before meals and at bedtime.  Very Labile on 5/28 with variable PO/supplement intake   CBG (last 3)  Recent Labs    12/12/19 1655 12/12/19 2058 12/13/19 0612  GLUCAP 256* 106* 80  will decrease lantus today due to low am CBG May give novalog if pt eats breakfast -  discussed with RN  11. Post stroke  dysphagia.    Advance diet to D2 thins  Continue to advance as tolerated 12. Hyperlipidemia.  Lipitor 13. Essential HTN  See #8  Started lisinopril 2.5 daily on 5/16, increased to 5 on 5/28 14.  Sleep disturbance:   Melatonin 3mg  HS  Improving 15.  Hypokalemia  Potassium 3.7 on 5/28 16.  Tachypnea with Left pleural effusion of undetermied etiology, no fever , chills , cough to suggest infection will need to send fluid for analysis.  Have consulted pulmonary Discussed with husband Anselmo Rod [phone] and Jeannetta Nap [phone] who can sign consent for thoracentesis, hold Eliquis this am    LOS: 23 days A FACE TO [place] E Jennipher Weatherholtz 12/13/2019, 7:42 AM

## 2019-12-13 NOTE — Progress Notes (Signed)
Assisted Dr [place] Bells with bedside Thoracentesis.  Patient maintained O2 sats 98% through out procedure.  Post procedure VS done:  RR 30. PCXR done Dr [place] Bells notified.   RN to call if pneumothorax on PCXR or if RR does not improve.

## 2019-12-13 NOTE — Progress Notes (Signed)
Q2h VS obtained per MEWS protocol. Pt denies pain, CP, or SOB at this time.

## 2019-12-13 NOTE — Procedures (Addendum)
Thoracentesis Procedure Note  Pre-operative Diagnosis: left pleural effusion on radiograph  Post-operative Diagnosis: hemothorax  Indications: tachypnea  Procedure Details  Consent: Informed consent was obtained. Risks of the procedure were discussed including: infection, bleeding, pain, pneumothorax.  Ultrasound was used to identify the best pocket of fluid.  Ultrasound imaging revealed a large left pleural effusion.   Under sterile conditions the patient was positioned. Betadine solution and sterile drapes were utilized.  1% buffered lidocaine was used to anesthetize the 10th rib space. Fluid was obtained without any difficulties and minimal blood loss.  A dressing was applied to the wound and wound care instructions were provided.   Findings 1500 ml of bloody pleural fluid was obtained. A sample was sent to Pathology for cytogenetics, flow, and cell counts, as well as for infection analysis. Hematocrit was ordered on fluid as well.   Complications:  None; patient tolerated the procedure well.          Condition: stable  Plan A follow up chest x-ray was ordered. Bed Rest for 0 hours.  Attending Attestation: I performed the procedure.   Roselie Awkward, MD [facility] Pager: [phone] Cell: [phone] If no response, call [phone]

## 2019-12-13 NOTE — Progress Notes (Signed)
   12/13/19 1442  Assess: MEWS Score  Temp 98.4 F (36.9 C)  BP (!) 141/62  Pulse Rate 60  Resp (!) 26  SpO2 99 %  O2 Device Room Air  Assess: MEWS Score  MEWS Temp 0  MEWS Systolic 0  MEWS Pulse 0  MEWS RR 2  MEWS LOC 0  MEWS Score 2  MEWS Score Color Yellow  Assess: if the MEWS score is Yellow or Red  Were vital signs taken at a resting state? Yes  Focused Assessment Documented focused assessment  Early Detection of Sepsis Score *See Row Information* Low  MEWS guidelines implemented *See Row Information* Yes  Take Vital Signs  Increase Vital Sign Frequency  Yellow: Q 2hr X 2 then Q 4hr X 2, if remains yellow, continue Q 4hrs  Notify: Charge Nurse/RN  Name of Charge Nurse/RN Notified [place], RN  Date Charge Nurse/RN Notified 12/13/19  Time Charge Nurse/RN Notified [place], RN

## 2019-12-13 NOTE — Consult Note (Signed)
NAME:  Krystal Little, MRN:  [id number], DOB:  Dec 25, 1955, LOS: 31 ADMISSION DATE:  11/20/2019, CONSULTATION DATE: Dec 09, 2019 REFERRING MD: Dr. Letta Pate, CHIEF COMPLAINT:  congestion   Brief History   56 female with an extensive cardiac history was admitted to [facility] in April in the setting of a right MCA stroke, had a right interventional radiology guided thrombectomy, later had significant brain edema and underwent craniectomy, ultimately transferred to inpatient rehab.  Pulmonary and critical care medicine was consulted on May 29 in the setting of tachypnea and an enlarging left-sided pleural effusion.  History of present illness   20 female with an extensive cardiac history was admitted to [facility] in April in the setting of a right MCA stroke, had a right interventional radiology guided thrombectomy, later had significant brain edema and underwent craniectomy, ultimately transferred to inpatient rehab.  Pulmonary and critical care medicine was consulted on May 29 in the setting of tachypnea and an enlarging left-sided pleural effusion. While hospitalized at [facility] she had atrial fibrillation with RVR, cardiology was consulted and amiodarone was used.  She was eventually transitioned to oral anticoagulation.  Eliquis was used.  Since coming to cardiac rehab she has not had significant respiratory complaints.  However, she does note that she has had some increasing chest congestion.  Late at night on May 28 she developed tachypnea so a chest x-ray was ordered which showed a large left pleural effusion which was new.  The last chest x-ray which had been performed was in early May and did not show pleural effusion.  Pulmonary and critical care medicine was consulted.  She denies dyspnea.  She still has some chest congestion and produces clear sputum.  She denies the sensation of choking on food.  She denies leg swelling or chest pain.  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 of right basal ganglia intraparenchymal hematoma. Increased amount of subarachnoid blood over the right hemisphere. 11 mm of leftward midline shift at the level of the foramen of Monro with early herniation of the right cingulate gyrus beneath the falx cerebri CT head 4/20 > continued interval evolution of large right MCA subacute infract with continued interval increase in edema and swelling.  Petechial hemorrhage throughout the infarct has increased.  Similar parenchymal hemorrhage centered within the right basal ganglia.  CT 4/22 -  1. Stable appearance of large right MCA territory infarct with diffuse petechial hemorrhage. 2. Stable mass effect and midline shift. 3. Stable mild prominence of the left lateral ventricle.  CT head 4/25 1. Continued interval evolution of large right MCA territory infarct with associated hemorrhagic transformation, relatively stable as compared to 11/06/2019. No evidence for interval hemorrhage or re-bleeding. Associated mass effect with up to 12 mm of right-to-left shift not significantly changed. Stable asymmetric dilatation of the left lateral ventricle. 2. Right craniectomy without adverse features.  5/29 Thoracentesis left chest >>  Micro Data:  RVP 4/16 neg  MRSA PCR  4/17  neg  xxx Blood 4/23 ua 4/23 Trach aspirate 4/23 ++++  5/29 Pleural fluid culture >   Antimicrobials:  bactriacin 4/18 - 4/18   xxx cefepmine  No Known Allergies  Interim history/subjective:  As above  Objective   Blood pressure (!) 159/73, pulse 76, temperature 99 F (37.2 C), resp. rate (!) 31, height 5' 4" (1.626 m), weight 63.9 kg, SpO2 95 %.        Intake/Output Summary (Last 24 hours) at 12/13/2019 1309 Last data filed at 12/12/2019 2020 Gross per 24 hour  Intake 240 ml  Output --  Net 240 ml   Filed Weights   12/10/19 0339 12/12/19 0546 12/13/19 0500  Weight: 61.7 kg 62.7 kg 63.9 kg    Examination: General:  Resting comfortably in bed, mild tachypnea, no accessory muscle use, speaking in full sentences HENT: NCAT OP clear PULM: diminished on left B, normal effort CV: RRR, no mgr GI: BS+, soft, nontender MSK: normal bulk and tone Neuro: awake, alert, no distress, MAEW   Resolved Hospital Problem list     Assessment & Plan:  Left pleural effusion: Hemothorax, uncertain etiology but certainly exacerbated while on Eliquis Thoracentesis performed today showing bloody fluid consistent with hemothorax Hold Eliquis Send pleural fluid for hematocrit Pleural fluid cell count, cytology, culture, LDH, protein, glucose, pH Chest x-ray after thoracentesis CT chest  We will follow with you, monitor fluid analysis  Best practice:  Per primary service  Labs   CBC: Recent Labs  Lab 12/08/19 0620 12/12/19 2216  WBC 6.7 9.8  NEUTROABS 4.5 7.2  HGB 11.3* 10.2*  HCT 36.3 32.1*  MCV 96.5 92.2  PLT 199 230    Basic Metabolic Panel: Recent Labs  Lab 12/08/19 0620 12/12/19 0726  NA 140 138  K 3.5 3.7  CL 104 100  CO2 26 27  GLUCOSE 133* 100*  BUN 10 11  CREATININE 0.58 0.58  CALCIUM 8.9 8.9   GFR: Estimated Creatinine Clearance: 60.5 mL/min (by C-G formula based on SCr of 0.58 mg/dL). Recent Labs  Lab 12/08/19 0620 12/12/19 2216  WBC 6.7 9.8    Liver Function Tests: No results for input(s): AST, ALT, ALKPHOS, BILITOT, PROT, ALBUMIN in the last 168 hours. No results for  input(s): LIPASE, AMYLASE in the last 168 hours. No results for input(s): AMMONIA in the last 168 hours.  ABG    Component Value Date/Time   PHART 7.558 (H) 11/09/2019 1711   PCO2ART 40.2 11/09/2019 1711   PO2ART 64 (L) 11/09/2019 1711   HCO3 35.5 (H) 11/09/2019 1711   TCO2 37 (H) 11/09/2019 1711   ACIDBASEDEF 2.0 11/02/2019 1202   O2SAT 94.0 11/09/2019 1711     Coagulation Profile: No results for input(s): INR, PROTIME in the last 168 hours.  Cardiac Enzymes: No results for input(s): CKTOTAL, CKMB, CKMBINDEX, TROPONINI in the last 168 hours.  HbA1C: HbA1c, POC (controlled diabetic range)  Date/Time Value Ref Range Status  08/18/2019 10:45 AM 12.2 (A) 0.0 - 7.0 % Final  02/04/2018 04:20 PM 8.0 (A) 0.0 - 7.0 % Final   Hgb A1c MFr Bld  Date/Time Value Ref Range Status  11/01/2019 03:48 AM 9.7 (H) 4.8 - 5.6 % Final    Comment:    (NOTE) Pre diabetes:          5.7%-6.4% Diabetes:              >6.4% Glycemic control for   <7.0% adults with diabetes     CBG: Recent Labs    Lab 12/12/19 1205 12/12/19 1655 12/12/19 2058 12/13/19 0612 12/13/19 1135  GLUCAP 311* 256* 106* 80 138*    Review of Systems:   Gen: Denies fever, chills, weight change, fatigue, night sweats HEENT: Denies blurred vision, double vision, hearing loss, tinnitus, sinus congestion, rhinorrhea, sore throat, neck stiffness, dysphagia PULM: per HPI CV: Denies chest pain, edema, orthopnea, paroxysmal nocturnal dyspnea, palpitations GI: Denies abdominal pain, nausea, vomiting, diarrhea, hematochezia, melena, constipation, change in bowel habits GU: Denies dysuria, hematuria, polyuria, oliguria, urethral discharge Endocrine: Denies hot or cold intolerance, polyuria, polyphagia or appetite change Derm: Denies rash, dry skin, scaling or peeling skin change Heme: Denies easy bruising, bleeding, bleeding gums Neuro: Denies headache, numbness, weakness, slurred speech, loss of memory or consciousness   Past  Medical History  She,  has a past medical history of Chronic atrial fibrillation ([place]) (1990s), Diabetes mellitus type 2, uncontrolled, without complications, Essential hypertension, History of cardiac catheterization (2001), sick sinus syndrome (07/1999), S/P MVR (mitral valve repair) (08/09/1999), and 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   Surgical History    Past Surgical History:  Procedure Laterality Date  . CARDIAC CATHETERIZATION  08/03/1999   [place] - normal Coronaries; no sign of constriction or restrictive cardiomypathy  . CRANIECTOMY FOR DEPRESSED SKULL FRACTURE Right 11/02/2019   Procedure: DECOMPRESSIVE HEMI -CRANIECTOMY, IMPLANTATION OF SKULL FLAP TO RIGHT ABDOMEN;  Surgeon: Consuella Lose, MD;  Location: [place];  Service: Neurosurgery;  Laterality: Right;  . IR CT HEAD LTD  10/31/2019  . IR PERCUTANEOUS ART THROMBECTOMY/INFUSION INTRACRANIAL INC DIAG ANGIO  10/31/2019      . IR PERCUTANEOUS ART THROMBECTOMY/INFUSION INTRACRANIAL INC DIAG ANGIO  10/31/2019  . MITRAL VALVE REPAIR  07/1999   Both Ant& Post leaflet repair - quadrangular resection&caudal transposition, #32 Sequin Annuloplasty ring  . PACEMAKER GENERATOR CHANGE  11/26/2008   Medtronic Adapta L(? if it has been checked since 02/2013)  . PACEMAKER INSERTION  07/1999   for SSS in setting of Chronic Afib; Medtronic Kappa [id number], [id number] H.  Marland Kitchen RADIOLOGY WITH ANESTHESIA N/A 10/31/2019   Procedure: IR WITH ANESTHESIA;  Surgeon: Radiologist, Medication, MD;  Location: [place];  Service: Radiology;  Laterality: N/A;  . TRANSTHORACIC ECHOCARDIOGRAM  05/2018   Normal LV size and thickness.  EF estimated 50%.  Inferobasal HK and flattened septum c/w with elevated PA pressures. Mild functional mitral stenosis at rest (postoperative).  Moderate left atrial dilation and mild right atrial dilation.  Mean PA pressures estimated 52 mmHg.     Social History   reports that she has never smoked. She has never used smokeless tobacco.  She reports current alcohol use. She reports that she does not use drugs.   Family History   Her family history includes Cancer in her brother; Diabetes in her sister; Hypertension in her mother; Lung cancer in her brother.   Allergies No Known Allergies   Home Medications  Prior to Admission medications   Medication Sig Start Date End Date Taking? Authorizing Provider  acetaminophen (TYLENOL) 500 MG tablet Take 500-1,000 mg by mouth every 6 (six) hours as needed for headache (pain).    [provider]  amantadine (SYMMETREL) 50 MG/5ML solution Place 10 mLs (100 mg total) into feeding tube 2 (two) times daily. 11/20/19   Donzetta Starch, NP  Amino Acids-Protein Hydrolys (FEEDING SUPPLEMENT, PRO-STAT SUGAR FREE 64,) LIQD Take 30 mLs by mouth 2 (two) times daily. 11/20/19   Donzetta Starch, NP  aspirin 325 MG tablet Place 1 tablet (325 mg  total) into feeding tube daily. 11/21/19   Biby, Sharon L, NP  atorvastatin (LIPITOR) 40 MG tablet Place 1 tablet (40 mg total) into feeding tube daily. 11/21/19   Biby, Sharon L, NP  Chlorhexidine Gluconate Cloth 2 % PADS Apply 6 each topically daily at 6 (six) AM. 11/21/19   Biby, Sharon L, NP  chlorhexidine gluconate, MEDLINE KIT, (PERIDEX) 0.12 % solution 15 mLs by Mouth Rinse route 2 (two) times daily. 11/20/19   Biby, Sharon L, NP  diltiazem (CARDIZEM) 10 mg/ml oral suspension Place 6 mLs (60 mg total) into feeding tube every 6 (six) hours. 11/20/19   Biby, Sharon L, NP  enoxaparin (LOVENOX) 40 MG/0.4ML injection Inject 0.4 mLs (40 mg total) into the skin daily. 11/20/19   Biby, Sharon L, NP  insulin aspart (NOVOLOG) 100 UNIT/ML injection Inject 0-9 Units into the skin 3 (three) times daily with meals. 11/20/19   Biby, Sharon L, NP  insulin aspart (NOVOLOG) 100 UNIT/ML injection Inject 8 Units into the skin 2 (two) times daily. 11/20/19   Biby, Sharon L, NP  insulin glargine (LANTUS) 100 UNIT/ML injection Inject 0.2 mLs (20 Units total) into the skin 2 (two) times  daily. 11/20/19   Biby, Sharon L, NP  Maltodextrin-Xanthan Gum (RESOURCE THICKENUP CLEAR) POWD Take 120 g by mouth as needed (nectar thick liquids). 11/20/19   Biby, Sharon L, NP  metoprolol tartrate (LOPRESSOR) 25 MG tablet Place 1 tablet (25 mg total) into feeding tube 2 (two) times daily. 11/20/19   Biby, Sharon L, NP  Nutritional Supplements (FEEDING SUPPLEMENT, GLUCERNA 1.5 CAL,) LIQD Place 500 mLs into feeding tube daily. 11/20/19   Biby, Sharon L, NP  polyethylene glycol (MIRALAX / GLYCOLAX) 17 g packet Place 17 g into feeding tube 2 (two) times daily. 11/20/19   Biby, Sharon L, NP  senna-docusate (SENOKOT-S) 8.6-50 MG tablet Place 1 tablet into feeding tube at bedtime as needed for mild constipation. 11/20/19   Biby, Sharon L, NP  senna-docusate (SENOKOT-S) 8.6-50 MG tablet Place 1 tablet into feeding tube 2 (two) times daily. 11/20/19   Biby, Sharon L, NP     Critical care time: n/a    Brent McQuaid, MD [place] [facility] Pager: [phone] Cell: [phone] If no response, call [phone]     

## 2019-12-13 NOTE — Progress Notes (Signed)
   12/13/19 1358  Assess: MEWS Score  BP 134/69  Pulse Rate 64  Resp (!) 24  SpO2 100 %  O2 Device Nasal Cannula  O2 Flow Rate (L/min) 2 L/min  Assess: MEWS Score  MEWS Temp 0  MEWS Systolic 0  MEWS Pulse 0  MEWS RR 1  MEWS LOC 0  MEWS Score 1  MEWS Score Color Green  Assess: if the MEWS score is Yellow or Red  Were vital signs taken at a resting state? Yes  Focused Assessment Documented focused assessment  Early Detection of Sepsis Score *See Row Information* Low  MEWS guidelines implemented *See Row Information* No, vital signs rechecked  Treat  MEWS Interventions Other (Comment) (rechecked VS d/t post VS collected post procedure)  Notify: Charge Nurse/RN  Name of Charge Nurse/RN Notified Mekides, RN  Date Charge Nurse/RN Notified 12/13/19  Time Charge Nurse/RN Notified 1345   MEWS not implemented due to pt VS rechecked 1 hr post thoracentesis and pt stable. Pt denies of pain. drsg reapplied to L mid back area. Pt resting with call bell in reach. Will cont to monitor  Erie Noe, RN

## 2019-12-14 ENCOUNTER — Inpatient Hospital Stay (HOSPITAL_COMMUNITY): Payer: Medicare Other

## 2019-12-14 ENCOUNTER — Other Ambulatory Visit: Payer: Self-pay

## 2019-12-14 ENCOUNTER — Encounter (HOSPITAL_COMMUNITY): Payer: Self-pay | Admitting: Pulmonary Disease

## 2019-12-14 ENCOUNTER — Inpatient Hospital Stay (HOSPITAL_COMMUNITY)
Admission: AD | Admit: 2019-12-14 | Discharge: 2020-02-15 | DRG: 003 | Disposition: E | Payer: Medicare Other | Source: Intra-hospital | Attending: Pulmonary Disease | Admitting: Pulmonary Disease

## 2019-12-14 ENCOUNTER — Inpatient Hospital Stay: Payer: Self-pay

## 2019-12-14 DIAGNOSIS — R5381 Other malaise: Secondary | ICD-10-CM | POA: Diagnosis not present

## 2019-12-14 DIAGNOSIS — E872 Acidosis: Secondary | ICD-10-CM | POA: Diagnosis not present

## 2019-12-14 DIAGNOSIS — E870 Hyperosmolality and hypernatremia: Secondary | ICD-10-CM | POA: Diagnosis not present

## 2019-12-14 DIAGNOSIS — J939 Pneumothorax, unspecified: Secondary | ICD-10-CM

## 2019-12-14 DIAGNOSIS — D72829 Elevated white blood cell count, unspecified: Secondary | ICD-10-CM

## 2019-12-14 DIAGNOSIS — Z833 Family history of diabetes mellitus: Secondary | ICD-10-CM

## 2019-12-14 DIAGNOSIS — J869 Pyothorax without fistula: Secondary | ICD-10-CM | POA: Diagnosis present

## 2019-12-14 DIAGNOSIS — Z992 Dependence on renal dialysis: Secondary | ICD-10-CM | POA: Diagnosis not present

## 2019-12-14 DIAGNOSIS — Z952 Presence of prosthetic heart valve: Secondary | ICD-10-CM

## 2019-12-14 DIAGNOSIS — J9600 Acute respiratory failure, unspecified whether with hypoxia or hypercapnia: Secondary | ICD-10-CM | POA: Diagnosis not present

## 2019-12-14 DIAGNOSIS — J181 Lobar pneumonia, unspecified organism: Secondary | ICD-10-CM | POA: Diagnosis not present

## 2019-12-14 DIAGNOSIS — Z7189 Other specified counseling: Secondary | ICD-10-CM | POA: Diagnosis not present

## 2019-12-14 DIAGNOSIS — Z8249 Family history of ischemic heart disease and other diseases of the circulatory system: Secondary | ICD-10-CM

## 2019-12-14 DIAGNOSIS — J942 Hemothorax: Secondary | ICD-10-CM | POA: Diagnosis present

## 2019-12-14 DIAGNOSIS — I517 Cardiomegaly: Secondary | ICD-10-CM | POA: Diagnosis not present

## 2019-12-14 DIAGNOSIS — R21 Rash and other nonspecific skin eruption: Secondary | ICD-10-CM | POA: Diagnosis not present

## 2019-12-14 DIAGNOSIS — I69354 Hemiplegia and hemiparesis following cerebral infarction affecting left non-dominant side: Secondary | ICD-10-CM | POA: Diagnosis not present

## 2019-12-14 DIAGNOSIS — J984 Other disorders of lung: Secondary | ICD-10-CM | POA: Diagnosis present

## 2019-12-14 DIAGNOSIS — D539 Nutritional anemia, unspecified: Secondary | ICD-10-CM | POA: Diagnosis not present

## 2019-12-14 DIAGNOSIS — G40901 Epilepsy, unspecified, not intractable, with status epilepticus: Secondary | ICD-10-CM | POA: Diagnosis not present

## 2019-12-14 DIAGNOSIS — I482 Chronic atrial fibrillation, unspecified: Secondary | ICD-10-CM | POA: Diagnosis not present

## 2019-12-14 DIAGNOSIS — R6521 Severe sepsis with septic shock: Secondary | ICD-10-CM | POA: Diagnosis not present

## 2019-12-14 DIAGNOSIS — K644 Residual hemorrhoidal skin tags: Secondary | ICD-10-CM | POA: Diagnosis present

## 2019-12-14 DIAGNOSIS — R64 Cachexia: Secondary | ICD-10-CM | POA: Diagnosis not present

## 2019-12-14 DIAGNOSIS — N186 End stage renal disease: Secondary | ICD-10-CM | POA: Diagnosis not present

## 2019-12-14 DIAGNOSIS — J948 Other specified pleural conditions: Secondary | ICD-10-CM | POA: Diagnosis not present

## 2019-12-14 DIAGNOSIS — D699 Hemorrhagic condition, unspecified: Secondary | ICD-10-CM | POA: Diagnosis not present

## 2019-12-14 DIAGNOSIS — Z452 Encounter for adjustment and management of vascular access device: Secondary | ICD-10-CM

## 2019-12-14 DIAGNOSIS — J9621 Acute and chronic respiratory failure with hypoxia: Secondary | ICD-10-CM | POA: Diagnosis not present

## 2019-12-14 DIAGNOSIS — J918 Pleural effusion in other conditions classified elsewhere: Secondary | ICD-10-CM | POA: Diagnosis not present

## 2019-12-14 DIAGNOSIS — I469 Cardiac arrest, cause unspecified: Secondary | ICD-10-CM | POA: Diagnosis not present

## 2019-12-14 DIAGNOSIS — Z9911 Dependence on respirator [ventilator] status: Secondary | ICD-10-CM

## 2019-12-14 DIAGNOSIS — I639 Cerebral infarction, unspecified: Secondary | ICD-10-CM | POA: Diagnosis not present

## 2019-12-14 DIAGNOSIS — Z789 Other specified health status: Secondary | ICD-10-CM | POA: Diagnosis not present

## 2019-12-14 DIAGNOSIS — G92 Toxic encephalopathy: Secondary | ICD-10-CM | POA: Diagnosis not present

## 2019-12-14 DIAGNOSIS — Z794 Long term (current) use of insulin: Secondary | ICD-10-CM

## 2019-12-14 DIAGNOSIS — Z9889 Other specified postprocedural states: Secondary | ICD-10-CM

## 2019-12-14 DIAGNOSIS — I4821 Permanent atrial fibrillation: Secondary | ICD-10-CM | POA: Diagnosis not present

## 2019-12-14 DIAGNOSIS — E1165 Type 2 diabetes mellitus with hyperglycemia: Secondary | ICD-10-CM | POA: Diagnosis present

## 2019-12-14 DIAGNOSIS — E876 Hypokalemia: Secondary | ICD-10-CM | POA: Diagnosis not present

## 2019-12-14 DIAGNOSIS — R918 Other nonspecific abnormal finding of lung field: Secondary | ICD-10-CM | POA: Diagnosis not present

## 2019-12-14 DIAGNOSIS — Z931 Gastrostomy status: Secondary | ICD-10-CM | POA: Diagnosis not present

## 2019-12-14 DIAGNOSIS — R042 Hemoptysis: Secondary | ICD-10-CM

## 2019-12-14 DIAGNOSIS — E11649 Type 2 diabetes mellitus with hypoglycemia without coma: Secondary | ICD-10-CM | POA: Diagnosis not present

## 2019-12-14 DIAGNOSIS — Z515 Encounter for palliative care: Secondary | ICD-10-CM

## 2019-12-14 DIAGNOSIS — I132 Hypertensive heart and chronic kidney disease with heart failure and with stage 5 chronic kidney disease, or end stage renal disease: Secondary | ICD-10-CM | POA: Diagnosis present

## 2019-12-14 DIAGNOSIS — I619 Nontraumatic intracerebral hemorrhage, unspecified: Secondary | ICD-10-CM | POA: Diagnosis not present

## 2019-12-14 DIAGNOSIS — Z79899 Other long term (current) drug therapy: Secondary | ICD-10-CM

## 2019-12-14 DIAGNOSIS — I509 Heart failure, unspecified: Secondary | ICD-10-CM | POA: Diagnosis present

## 2019-12-14 DIAGNOSIS — Z7901 Long term (current) use of anticoagulants: Secondary | ICD-10-CM

## 2019-12-14 DIAGNOSIS — E1129 Type 2 diabetes mellitus with other diabetic kidney complication: Secondary | ICD-10-CM | POA: Diagnosis not present

## 2019-12-14 DIAGNOSIS — Z09 Encounter for follow-up examination after completed treatment for conditions other than malignant neoplasm: Secondary | ICD-10-CM

## 2019-12-14 DIAGNOSIS — D509 Iron deficiency anemia, unspecified: Secondary | ICD-10-CM | POA: Diagnosis not present

## 2019-12-14 DIAGNOSIS — Z9689 Presence of other specified functional implants: Secondary | ICD-10-CM

## 2019-12-14 DIAGNOSIS — Z4682 Encounter for fitting and adjustment of non-vascular catheter: Secondary | ICD-10-CM | POA: Diagnosis not present

## 2019-12-14 DIAGNOSIS — E860 Dehydration: Secondary | ICD-10-CM | POA: Diagnosis not present

## 2019-12-14 DIAGNOSIS — R0600 Dyspnea, unspecified: Secondary | ICD-10-CM

## 2019-12-14 DIAGNOSIS — G479 Sleep disorder, unspecified: Secondary | ICD-10-CM | POA: Diagnosis present

## 2019-12-14 DIAGNOSIS — I10 Essential (primary) hypertension: Secondary | ICD-10-CM | POA: Diagnosis not present

## 2019-12-14 DIAGNOSIS — E43 Unspecified severe protein-calorie malnutrition: Secondary | ICD-10-CM | POA: Diagnosis not present

## 2019-12-14 DIAGNOSIS — R569 Unspecified convulsions: Secondary | ICD-10-CM | POA: Diagnosis not present

## 2019-12-14 DIAGNOSIS — R0902 Hypoxemia: Secondary | ICD-10-CM | POA: Diagnosis not present

## 2019-12-14 DIAGNOSIS — Z930 Tracheostomy status: Secondary | ICD-10-CM

## 2019-12-14 DIAGNOSIS — R234 Changes in skin texture: Secondary | ICD-10-CM | POA: Diagnosis not present

## 2019-12-14 DIAGNOSIS — J810 Acute pulmonary edema: Secondary | ICD-10-CM | POA: Diagnosis not present

## 2019-12-14 DIAGNOSIS — Z4901 Encounter for fitting and adjustment of extracorporeal dialysis catheter: Secondary | ICD-10-CM | POA: Diagnosis not present

## 2019-12-14 DIAGNOSIS — R140 Abdominal distension (gaseous): Secondary | ICD-10-CM | POA: Diagnosis not present

## 2019-12-14 DIAGNOSIS — R571 Hypovolemic shock: Secondary | ICD-10-CM | POA: Diagnosis not present

## 2019-12-14 DIAGNOSIS — J9601 Acute respiratory failure with hypoxia: Secondary | ICD-10-CM

## 2019-12-14 DIAGNOSIS — R578 Other shock: Secondary | ICD-10-CM | POA: Diagnosis not present

## 2019-12-14 DIAGNOSIS — K921 Melena: Secondary | ICD-10-CM | POA: Diagnosis not present

## 2019-12-14 DIAGNOSIS — I69391 Dysphagia following cerebral infarction: Secondary | ICD-10-CM

## 2019-12-14 DIAGNOSIS — J189 Pneumonia, unspecified organism: Secondary | ICD-10-CM | POA: Diagnosis not present

## 2019-12-14 DIAGNOSIS — I63411 Cerebral infarction due to embolism of right middle cerebral artery: Secondary | ICD-10-CM | POA: Diagnosis not present

## 2019-12-14 DIAGNOSIS — Z0189 Encounter for other specified special examinations: Secondary | ICD-10-CM

## 2019-12-14 DIAGNOSIS — D689 Coagulation defect, unspecified: Secondary | ICD-10-CM | POA: Diagnosis not present

## 2019-12-14 DIAGNOSIS — N170 Acute kidney failure with tubular necrosis: Secondary | ICD-10-CM | POA: Diagnosis not present

## 2019-12-14 DIAGNOSIS — R069 Unspecified abnormalities of breathing: Secondary | ICD-10-CM

## 2019-12-14 DIAGNOSIS — J9611 Chronic respiratory failure with hypoxia: Secondary | ICD-10-CM | POA: Diagnosis not present

## 2019-12-14 DIAGNOSIS — I69398 Other sequelae of cerebral infarction: Secondary | ICD-10-CM

## 2019-12-14 DIAGNOSIS — N179 Acute kidney failure, unspecified: Secondary | ICD-10-CM | POA: Diagnosis not present

## 2019-12-14 DIAGNOSIS — I872 Venous insufficiency (chronic) (peripheral): Secondary | ICD-10-CM | POA: Diagnosis not present

## 2019-12-14 DIAGNOSIS — J9690 Respiratory failure, unspecified, unspecified whether with hypoxia or hypercapnia: Secondary | ICD-10-CM | POA: Diagnosis not present

## 2019-12-14 DIAGNOSIS — Z951 Presence of aortocoronary bypass graft: Secondary | ICD-10-CM

## 2019-12-14 DIAGNOSIS — Z6824 Body mass index (BMI) 24.0-24.9, adult: Secondary | ICD-10-CM

## 2019-12-14 DIAGNOSIS — I63511 Cerebral infarction due to unspecified occlusion or stenosis of right middle cerebral artery: Secondary | ICD-10-CM | POA: Diagnosis not present

## 2019-12-14 DIAGNOSIS — T884XXA Failed or difficult intubation, initial encounter: Secondary | ICD-10-CM

## 2019-12-14 DIAGNOSIS — I495 Sick sinus syndrome: Secondary | ICD-10-CM | POA: Diagnosis present

## 2019-12-14 DIAGNOSIS — K922 Gastrointestinal hemorrhage, unspecified: Secondary | ICD-10-CM | POA: Diagnosis not present

## 2019-12-14 DIAGNOSIS — D62 Acute posthemorrhagic anemia: Secondary | ICD-10-CM | POA: Diagnosis present

## 2019-12-14 DIAGNOSIS — I472 Ventricular tachycardia: Secondary | ICD-10-CM | POA: Diagnosis not present

## 2019-12-14 DIAGNOSIS — D7389 Other diseases of spleen: Secondary | ICD-10-CM | POA: Diagnosis not present

## 2019-12-14 DIAGNOSIS — E440 Moderate protein-calorie malnutrition: Secondary | ICD-10-CM | POA: Diagnosis not present

## 2019-12-14 DIAGNOSIS — E785 Hyperlipidemia, unspecified: Secondary | ICD-10-CM | POA: Diagnosis present

## 2019-12-14 DIAGNOSIS — E871 Hypo-osmolality and hyponatremia: Secondary | ICD-10-CM | POA: Diagnosis not present

## 2019-12-14 DIAGNOSIS — I611 Nontraumatic intracerebral hemorrhage in hemisphere, cortical: Secondary | ICD-10-CM | POA: Diagnosis not present

## 2019-12-14 DIAGNOSIS — R519 Headache, unspecified: Secondary | ICD-10-CM | POA: Diagnosis not present

## 2019-12-14 DIAGNOSIS — J690 Pneumonitis due to inhalation of food and vomit: Secondary | ICD-10-CM | POA: Diagnosis not present

## 2019-12-14 DIAGNOSIS — J90 Pleural effusion, not elsewhere classified: Secondary | ICD-10-CM | POA: Diagnosis not present

## 2019-12-14 DIAGNOSIS — G936 Cerebral edema: Secondary | ICD-10-CM | POA: Diagnosis not present

## 2019-12-14 DIAGNOSIS — J9811 Atelectasis: Secondary | ICD-10-CM | POA: Diagnosis not present

## 2019-12-14 DIAGNOSIS — D696 Thrombocytopenia, unspecified: Secondary | ICD-10-CM | POA: Diagnosis not present

## 2019-12-14 DIAGNOSIS — A419 Sepsis, unspecified organism: Secondary | ICD-10-CM | POA: Diagnosis not present

## 2019-12-14 DIAGNOSIS — R0602 Shortness of breath: Secondary | ICD-10-CM | POA: Diagnosis not present

## 2019-12-14 DIAGNOSIS — Z4659 Encounter for fitting and adjustment of other gastrointestinal appliance and device: Secondary | ICD-10-CM

## 2019-12-14 DIAGNOSIS — Z978 Presence of other specified devices: Secondary | ICD-10-CM

## 2019-12-14 DIAGNOSIS — R1310 Dysphagia, unspecified: Secondary | ICD-10-CM | POA: Diagnosis not present

## 2019-12-14 DIAGNOSIS — D631 Anemia in chronic kidney disease: Secondary | ICD-10-CM | POA: Diagnosis present

## 2019-12-14 DIAGNOSIS — E2740 Unspecified adrenocortical insufficiency: Secondary | ICD-10-CM | POA: Diagnosis not present

## 2019-12-14 DIAGNOSIS — K567 Ileus, unspecified: Secondary | ICD-10-CM | POA: Diagnosis not present

## 2019-12-14 DIAGNOSIS — Z7982 Long term (current) use of aspirin: Secondary | ICD-10-CM

## 2019-12-14 DIAGNOSIS — E1122 Type 2 diabetes mellitus with diabetic chronic kidney disease: Secondary | ICD-10-CM | POA: Diagnosis present

## 2019-12-14 DIAGNOSIS — R1110 Vomiting, unspecified: Secondary | ICD-10-CM | POA: Diagnosis not present

## 2019-12-14 DIAGNOSIS — K7689 Other specified diseases of liver: Secondary | ICD-10-CM | POA: Diagnosis not present

## 2019-12-14 DIAGNOSIS — I119 Hypertensive heart disease without heart failure: Secondary | ICD-10-CM | POA: Diagnosis not present

## 2019-12-14 DIAGNOSIS — Z950 Presence of cardiac pacemaker: Secondary | ICD-10-CM

## 2019-12-14 DIAGNOSIS — J811 Chronic pulmonary edema: Secondary | ICD-10-CM | POA: Diagnosis not present

## 2019-12-14 DIAGNOSIS — I462 Cardiac arrest due to underlying cardiac condition: Secondary | ICD-10-CM | POA: Diagnosis not present

## 2019-12-14 DIAGNOSIS — K6389 Other specified diseases of intestine: Secondary | ICD-10-CM | POA: Diagnosis not present

## 2019-12-14 LAB — COMPREHENSIVE METABOLIC PANEL
ALT: 63 U/L — ABNORMAL HIGH (ref 0–44)
AST: 55 U/L — ABNORMAL HIGH (ref 15–41)
Albumin: 1.8 g/dL — ABNORMAL LOW (ref 3.5–5.0)
Alkaline Phosphatase: 235 U/L — ABNORMAL HIGH (ref 38–126)
Anion gap: 13 (ref 5–15)
BUN: 28 mg/dL — ABNORMAL HIGH (ref 8–23)
CO2: 25 mmol/L (ref 22–32)
Calcium: 8.6 mg/dL — ABNORMAL LOW (ref 8.9–10.3)
Chloride: 95 mmol/L — ABNORMAL LOW (ref 98–111)
Creatinine, Ser: 1.89 mg/dL — ABNORMAL HIGH (ref 0.44–1.00)
GFR calc Af Amer: 32 mL/min — ABNORMAL LOW (ref >60)
GFR calc non Af Amer: 27 mL/min — ABNORMAL LOW (ref >60)
Glucose, Bld: 240 mg/dL — ABNORMAL HIGH (ref 70–99)
Potassium: 5.4 mmol/L — ABNORMAL HIGH (ref 3.5–5.1)
Sodium: 133 mmol/L — ABNORMAL LOW (ref 135–145)
Total Bilirubin: 1.8 mg/dL — ABNORMAL HIGH (ref 0.3–1.2)
Total Protein: 7.1 g/dL (ref 6.5–8.1)

## 2019-12-14 LAB — CBC
HCT: 36.8 % (ref 36.0–46.0)
Hemoglobin: 11.6 g/dL — ABNORMAL LOW (ref 12.0–15.0)
MCH: 29.7 pg (ref 26.0–34.0)
MCHC: 31.5 g/dL (ref 30.0–36.0)
MCV: 94.4 fL (ref 80.0–100.0)
Platelets: 221 10*3/uL (ref 150–400)
RBC: 3.9 MIL/uL (ref 3.87–5.11)
RDW: 15.5 % (ref 11.5–15.5)
WBC: 25.2 10*3/uL — ABNORMAL HIGH (ref 4.0–10.5)
nRBC: 0 % (ref 0.0–0.2)

## 2019-12-14 LAB — URINALYSIS, ROUTINE W REFLEX MICROSCOPIC
Glucose, UA: 50 mg/dL — AB
Ketones, ur: NEGATIVE mg/dL
Nitrite: NEGATIVE
Protein, ur: 100 mg/dL — AB
RBC / HPF: >50 RBC/hpf — ABNORMAL HIGH (ref 0–5)
Specific Gravity, Urine: 1.029 (ref 1.005–1.030)
pH: 5 (ref 5.0–8.0)

## 2019-12-14 LAB — MAGNESIUM
Magnesium: 1.7 mg/dL (ref 1.7–2.4)
Magnesium: 1.5 mg/dL — ABNORMAL LOW (ref 1.7–2.4)

## 2019-12-14 LAB — GLUCOSE, CAPILLARY
Glucose-Capillary: 255 mg/dL — ABNORMAL HIGH (ref 70–99)
Glucose-Capillary: 189 mg/dL — ABNORMAL HIGH (ref 70–99)
Glucose-Capillary: 212 mg/dL — ABNORMAL HIGH (ref 70–99)
Glucose-Capillary: 218 mg/dL — ABNORMAL HIGH (ref 70–99)
Glucose-Capillary: 203 mg/dL — ABNORMAL HIGH (ref 70–99)
Glucose-Capillary: 202 mg/dL — ABNORMAL HIGH (ref 70–99)

## 2019-12-14 LAB — BLOOD GAS, ARTERIAL
Acid-Base Excess: 0.3 mmol/L (ref 0.0–2.0)
Bicarbonate: 24.6 mmol/L (ref 20.0–28.0)
Drawn by: 39899
FIO2: 100
O2 Saturation: 99.9 %
Patient temperature: 36.4
pCO2 arterial: 39.9 mmHg (ref 32.0–48.0)
pH, Arterial: 7.404 (ref 7.350–7.450)
pO2, Arterial: 209 mmHg — ABNORMAL HIGH (ref 83.0–108.0)

## 2019-12-14 LAB — PH, BODY FLUID: pH, Body Fluid: 7.5

## 2019-12-14 LAB — MRSA PCR SCREENING: MRSA by PCR: NEGATIVE

## 2019-12-14 LAB — PHOSPHORUS
Phosphorus: 6.7 mg/dL — ABNORMAL HIGH (ref 2.5–4.6)
Phosphorus: 6 mg/dL — ABNORMAL HIGH (ref 2.5–4.6)

## 2019-12-14 MED ORDER — DILTIAZEM 12 MG/ML ORAL SUSPENSION
60.0000 mg | Freq: Four times a day (QID) | ORAL | Status: DC
  Filled 2019-12-14 (×2): qty 6

## 2019-12-14 MED ORDER — FENTANYL CITRATE (PF) 100 MCG/2ML IJ SOLN
25.0000 ug | INTRAMUSCULAR | Status: DC | PRN
  Administered 2019-12-14: 100 ug via INTRAVENOUS
  Administered 2019-12-14: 50 ug via INTRAVENOUS
  Administered 2019-12-15: 100 ug via INTRAVENOUS
  Administered 2019-12-15: 25 ug via INTRAVENOUS
  Administered 2019-12-16: 75 ug via INTRAVENOUS
  Administered 2019-12-18 – 2019-12-19 (×3): 25 ug via INTRAVENOUS
  Filled 2019-12-14 (×8): qty 2

## 2019-12-14 MED ORDER — ETOMIDATE 2 MG/ML IV SOLN
INTRAVENOUS | Status: AC
  Filled 2019-12-14: qty 10

## 2019-12-14 MED ORDER — DOCUSATE SODIUM 100 MG PO CAPS: 100.0000 mg | ORAL_CAPSULE | Freq: Two times a day (BID) | ORAL | Status: DC | PRN

## 2019-12-14 MED ORDER — LACTATED RINGERS IV BOLUS
1000.0000 mL | Freq: Once | INTRAVENOUS | Status: AC
  Administered 2019-12-14: 1000 mL via INTRAVENOUS

## 2019-12-14 MED ORDER — GLUCERNA 1.5 CAL PO LIQD
500.0000 mL | ORAL | Status: DC
  Filled 2019-12-14 (×2): qty 711

## 2019-12-14 MED ORDER — DOCUSATE SODIUM 50 MG/5ML PO LIQD
100.0000 mg | Freq: Two times a day (BID) | ORAL | Status: DC
  Administered 2019-12-14 – 2019-12-22 (×10): 100 mg via ORAL
  Filled 2019-12-14 (×11): qty 10

## 2019-12-14 MED ORDER — INSULIN GLARGINE 100 UNIT/ML ~~LOC~~ SOLN
20.0000 [IU] | Freq: Two times a day (BID) | SUBCUTANEOUS | Status: DC
  Administered 2019-12-14 – 2019-12-17 (×7): 20 [IU] via SUBCUTANEOUS
  Filled 2019-12-14 (×9): qty 0.2

## 2019-12-14 MED ORDER — POLYETHYLENE GLYCOL 3350 17 G PO PACK: 17.0000 g | PACK | Freq: Every day | ORAL | Status: DC | PRN

## 2019-12-14 MED ORDER — ROCURONIUM BROMIDE 10 MG/ML (PF) SYRINGE
PREFILLED_SYRINGE | INTRAVENOUS | Status: AC
  Filled 2019-12-14: qty 10

## 2019-12-14 MED ORDER — PRO-STAT SUGAR FREE PO LIQD: 30.0000 mL | Freq: Two times a day (BID) | ORAL | Status: DC

## 2019-12-14 MED ORDER — SODIUM CHLORIDE 0.9% FLUSH
10.0000 mL | Freq: Three times a day (TID) | INTRAVENOUS | Status: DC
  Administered 2019-12-14 – 2019-12-21 (×20): 10 mL

## 2019-12-14 MED ORDER — FENTANYL CITRATE (PF) 100 MCG/2ML IJ SOLN
INTRAMUSCULAR | Status: AC
  Filled 2019-12-14: qty 2

## 2019-12-14 MED ORDER — VITAL HIGH PROTEIN PO LIQD
1000.0000 mL | ORAL | Status: DC
  Administered 2019-12-14: 1000 mL

## 2019-12-14 MED ORDER — INSULIN GLARGINE 100 UNIT/ML ~~LOC~~ SOLN
16.0000 [IU] | Freq: Two times a day (BID) | SUBCUTANEOUS | Status: DC
  Filled 2019-12-14: qty 0.16

## 2019-12-14 MED ORDER — MIDAZOLAM HCL 2 MG/2ML IJ SOLN
4.0000 mg | Freq: Once | INTRAMUSCULAR | Status: AC
  Administered 2019-12-14: 2 mg via INTRAVENOUS

## 2019-12-14 MED ORDER — ENOXAPARIN SODIUM 40 MG/0.4ML ~~LOC~~ SOLN: 40.0000 mg | SUBCUTANEOUS | Status: DC

## 2019-12-14 MED ORDER — SODIUM CHLORIDE 0.9% FLUSH
10.0000 mL | Freq: Two times a day (BID) | INTRAVENOUS | Status: DC
  Administered 2019-12-14 – 2019-12-21 (×13): 10 mL
  Administered 2019-12-21: 20 mL

## 2019-12-14 MED ORDER — CHLORHEXIDINE GLUCONATE 0.12% ORAL RINSE (MEDLINE KIT)
15.0000 mL | Freq: Two times a day (BID) | OROMUCOSAL | Status: DC
  Administered 2019-12-14 – 2019-12-20 (×6): 15 mL via OROMUCOSAL

## 2019-12-14 MED ORDER — PIPERACILLIN-TAZOBACTAM 3.375 G IVPB
3.3750 g | Freq: Three times a day (TID) | INTRAVENOUS | Status: DC
  Administered 2019-12-14 – 2019-12-20 (×19): 3.375 g via INTRAVENOUS
  Filled 2019-12-14 (×19): qty 50

## 2019-12-14 MED ORDER — INSULIN ASPART 100 UNIT/ML ~~LOC~~ SOLN
8.0000 [IU] | Freq: Two times a day (BID) | SUBCUTANEOUS | Status: DC
  Administered 2019-12-15: 8 [IU] via SUBCUTANEOUS

## 2019-12-14 MED ORDER — MIDAZOLAM HCL 2 MG/2ML IJ SOLN: 1.0000 mg | INTRAMUSCULAR | Status: DC | PRN

## 2019-12-14 MED ORDER — FENTANYL CITRATE (PF) 100 MCG/2ML IJ SOLN
25.0000 ug | INTRAMUSCULAR | Status: AC | PRN
  Administered 2019-12-14 – 2019-12-17 (×3): 25 ug via INTRAVENOUS
  Filled 2019-12-14 (×2): qty 2

## 2019-12-14 MED ORDER — FENTANYL CITRATE (PF) 100 MCG/2ML IJ SOLN: 50.0000 ug | INTRAMUSCULAR | Status: DC | PRN

## 2019-12-14 MED ORDER — SODIUM CHLORIDE 0.9 % IV SOLN: INTRAVENOUS | Status: DC | PRN

## 2019-12-14 MED ORDER — SENNOSIDES-DOCUSATE SODIUM 8.6-50 MG PO TABS
1.0000 | ORAL_TABLET | Freq: Two times a day (BID) | ORAL | Status: DC
  Administered 2019-12-14 – 2020-01-27 (×49): 1
  Filled 2019-12-14 (×61): qty 1

## 2019-12-14 MED ORDER — ETOMIDATE 2 MG/ML IV SOLN
20.0000 mg | Freq: Once | INTRAVENOUS | Status: AC
  Administered 2019-12-14: 20 mg via INTRAVENOUS

## 2019-12-14 MED ORDER — METOPROLOL TARTRATE 25 MG PO TABS: 25.0000 mg | ORAL_TABLET | Freq: Two times a day (BID) | ORAL | Status: DC

## 2019-12-14 MED ORDER — INSULIN ASPART 100 UNIT/ML ~~LOC~~ SOLN: 0.0000 [IU] | Freq: Three times a day (TID) | SUBCUTANEOUS | Status: DC

## 2019-12-14 MED ORDER — DEXMEDETOMIDINE BOLUS VIA INFUSION
1.0000 ug/kg | Freq: Once | INTRAVENOUS | Status: DC
  Filled 2019-12-14: qty 62

## 2019-12-14 MED ORDER — CHLORHEXIDINE GLUCONATE 0.12% ORAL RINSE (MEDLINE KIT)
15.0000 mL | Freq: Two times a day (BID) | OROMUCOSAL | Status: DC
  Administered 2019-12-15 – 2019-12-19 (×10): 15 mL via OROMUCOSAL

## 2019-12-14 MED ORDER — RESOURCE THICKENUP CLEAR PO POWD
1.0000 | ORAL | Status: DC | PRN
  Filled 2019-12-14: qty 125

## 2019-12-14 MED ORDER — NOREPINEPHRINE 4 MG/250ML-% IV SOLN
0.0000 ug/min | INTRAVENOUS | Status: DC
  Administered 2019-12-14: 2 ug/min via INTRAVENOUS
  Administered 2019-12-14: 11 ug/min via INTRAVENOUS
  Administered 2019-12-14: 8 ug/min via INTRAVENOUS
  Administered 2019-12-15: 5 ug/min via INTRAVENOUS
  Administered 2019-12-17 (×2): 4 ug/min via INTRAVENOUS
  Filled 2019-12-14 (×2): qty 250
  Filled 2019-12-14: qty 500
  Filled 2019-12-14 (×2): qty 250

## 2019-12-14 MED ORDER — ASPIRIN 325 MG PO TABS
325.0000 mg | ORAL_TABLET | Freq: Every day | ORAL | Status: DC
  Administered 2019-12-15 – 2019-12-21 (×7): 325 mg
  Filled 2019-12-14 (×7): qty 1

## 2019-12-14 MED ORDER — CHLORHEXIDINE GLUCONATE CLOTH 2 % EX PADS
6.0000 | MEDICATED_PAD | Freq: Every day | CUTANEOUS | Status: DC
  Administered 2019-12-14: 6 via TOPICAL

## 2019-12-14 MED ORDER — POLYETHYLENE GLYCOL 3350 17 G PO PACK
17.0000 g | PACK | Freq: Two times a day (BID) | ORAL | Status: DC
  Administered 2019-12-14 – 2019-12-21 (×10): 17 g
  Filled 2019-12-14 (×10): qty 1

## 2019-12-14 MED ORDER — ATORVASTATIN CALCIUM 40 MG PO TABS
40.0000 mg | ORAL_TABLET | Freq: Every day | ORAL | Status: DC
  Administered 2019-12-15 – 2020-02-01 (×48): 40 mg
  Filled 2019-12-14 (×48): qty 1

## 2019-12-14 MED ORDER — MIDAZOLAM HCL 2 MG/2ML IJ SOLN
1.0000 mg | INTRAMUSCULAR | Status: DC | PRN
  Administered 2019-12-14 – 2019-12-15 (×2): 1 mg via INTRAVENOUS
  Filled 2019-12-14 (×2): qty 2

## 2019-12-14 MED ORDER — MIDAZOLAM HCL 2 MG/2ML IJ SOLN
INTRAMUSCULAR | Status: AC
  Filled 2019-12-14: qty 4

## 2019-12-14 MED ORDER — POLYETHYLENE GLYCOL 3350 17 G PO PACK: 17.0000 g | PACK | Freq: Every day | ORAL | Status: DC

## 2019-12-14 MED ORDER — INSULIN ASPART 100 UNIT/ML ~~LOC~~ SOLN
0.0000 [IU] | Freq: Three times a day (TID) | SUBCUTANEOUS | Status: DC
  Administered 2019-12-14: 3 [IU] via SUBCUTANEOUS
  Administered 2019-12-14: 2 [IU] via SUBCUTANEOUS
  Administered 2019-12-15: 7 [IU] via SUBCUTANEOUS
  Administered 2019-12-15: 5 [IU] via SUBCUTANEOUS

## 2019-12-14 MED ORDER — ORAL CARE MOUTH RINSE
15.0000 mL | OROMUCOSAL | Status: DC
  Administered 2019-12-14 – 2019-12-20 (×50): 15 mL via OROMUCOSAL

## 2019-12-14 MED ORDER — DEXMEDETOMIDINE HCL IN NACL 400 MCG/100ML IV SOLN
0.0000 ug/kg/h | INTRAVENOUS | Status: AC
  Administered 2019-12-14 – 2019-12-15 (×3): 1.2 ug/kg/h via INTRAVENOUS
  Administered 2019-12-15: 1 ug/kg/h via INTRAVENOUS
  Administered 2019-12-15: 0.9 ug/kg/h via INTRAVENOUS
  Administered 2019-12-16 (×2): 1.2 ug/kg/h via INTRAVENOUS
  Administered 2019-12-16: 0.7 ug/kg/h via INTRAVENOUS
  Administered 2019-12-17: 0.5 ug/kg/h via INTRAVENOUS
  Administered 2019-12-17: 1 ug/kg/h via INTRAVENOUS
  Filled 2019-12-14: qty 200
  Filled 2019-12-14 (×7): qty 100
  Filled 2019-12-14: qty 200

## 2019-12-14 MED ORDER — ROCURONIUM BROMIDE 10 MG/ML (PF) SYRINGE
70.0000 mg | PREFILLED_SYRINGE | Freq: Once | INTRAVENOUS | Status: AC
  Administered 2019-12-14: 70 mg via INTRAVENOUS

## 2019-12-14 MED ORDER — FAMOTIDINE IN NACL 20-0.9 MG/50ML-% IV SOLN
20.0000 mg | INTRAVENOUS | Status: DC
  Administered 2019-12-14 – 2019-12-20 (×7): 20 mg via INTRAVENOUS
  Filled 2019-12-14 (×7): qty 50

## 2019-12-14 MED ORDER — CHLORHEXIDINE GLUCONATE CLOTH 2 % EX PADS
6.0000 | MEDICATED_PAD | Freq: Every day | CUTANEOUS | Status: DC
  Administered 2019-12-15 – 2020-01-05 (×19): 6 via TOPICAL

## 2019-12-14 MED ORDER — LACTATED RINGERS IV BOLUS
500.0000 mL | Freq: Once | INTRAVENOUS | Status: AC
  Administered 2019-12-14: 500 mL via INTRAVENOUS

## 2019-12-14 MED ORDER — ENOXAPARIN SODIUM 30 MG/0.3ML ~~LOC~~ SOLN
30.0000 mg | SUBCUTANEOUS | Status: DC
  Administered 2019-12-15: 30 mg via SUBCUTANEOUS
  Filled 2019-12-14: qty 0.3

## 2019-12-14 MED ORDER — MIDAZOLAM HCL 2 MG/2ML IJ SOLN
1.0000 mg | INTRAMUSCULAR | Status: DC | PRN
  Administered 2019-12-14: 1 mg via INTRAVENOUS

## 2019-12-14 MED ORDER — DEXMEDETOMIDINE HCL IN NACL 400 MCG/100ML IV SOLN
0.4000 ug/kg/h | INTRAVENOUS | Status: DC
  Administered 2019-12-14: 0.4 ug/kg/h via INTRAVENOUS
  Filled 2019-12-14: qty 100

## 2019-12-14 MED ORDER — ACETAMINOPHEN 500 MG PO TABS
500.0000 mg | ORAL_TABLET | Freq: Four times a day (QID) | ORAL | Status: DC | PRN
  Administered 2019-12-14 – 2019-12-16 (×2): 1000 mg
  Administered 2019-12-17: 500 mg
  Administered 2019-12-19 – 2019-12-20 (×2): 1000 mg
  Administered 2020-01-08: 500 mg
  Administered 2020-01-18 – 2020-02-01 (×3): 1000 mg
  Filled 2019-12-14 (×5): qty 2
  Filled 2019-12-14 (×2): qty 1
  Filled 2019-12-14 (×2): qty 2

## 2019-12-14 MED ORDER — SENNOSIDES-DOCUSATE SODIUM 8.6-50 MG PO TABS
1.0000 | ORAL_TABLET | Freq: Every evening | ORAL | Status: DC | PRN
  Administered 2019-12-28: 1

## 2019-12-14 MED ORDER — ENOXAPARIN SODIUM 40 MG/0.4ML ~~LOC~~ SOLN
40.0000 mg | SUBCUTANEOUS | Status: DC
  Administered 2019-12-14: 40 mg via SUBCUTANEOUS
  Filled 2019-12-14: qty 0.4

## 2019-12-14 MED ORDER — FENTANYL CITRATE (PF) 100 MCG/2ML IJ SOLN
100.0000 ug | Freq: Once | INTRAMUSCULAR | Status: AC
  Administered 2019-12-14: 50 ug via INTRAVENOUS

## 2019-12-14 MED ORDER — SODIUM CHLORIDE 0.9% FLUSH
10.0000 mL | INTRAVENOUS | Status: DC | PRN
  Administered 2019-12-18: 3 mL

## 2019-12-14 MED ORDER — AMANTADINE HCL 50 MG/5ML PO SYRP
100.0000 mg | ORAL_SOLUTION | Freq: Two times a day (BID) | ORAL | Status: DC
  Administered 2019-12-14 – 2020-02-01 (×95): 100 mg
  Filled 2019-12-14 (×105): qty 10

## 2019-12-14 MED ORDER — PRO-STAT SUGAR FREE PO LIQD
30.0000 mL | Freq: Two times a day (BID) | ORAL | Status: DC
  Administered 2019-12-14 – 2019-12-15 (×2): 30 mL via ORAL
  Filled 2019-12-14 (×3): qty 30

## 2019-12-14 NOTE — Procedures (Signed)
Arterial Catheter Insertion Procedure Note Krystal Little [id number] 06-06-1956  Procedure: Insertion of Arterial Catheter  Indications: Blood pressure monitoring and Frequent blood sampling  Procedure Details Consent: Unable to obtain consent because of on ventilator.. Time Out: Verified patient identification, verified procedure, site/side was marked, verified correct patient position, special equipment/implants available, medications/allergies/relevent history reviewed, required imaging and test results available.  Performed  Maximum sterile technique was used including antiseptics, cap, gloves, gown, hand hygiene, mask and sheet. Skin prep: Chlorhexidine; local anesthetic administered 20 gauge catheter was inserted into right radial artery using the Seldinger technique. ULTRASOUND GUIDANCE USED: NO Evaluation Blood flow good; BP tracing good. Complications: No apparent complications.   Judith Part 12/10/2019

## 2019-12-14 NOTE — Progress Notes (Signed)
Chest Tube Insertion Procedure Note  Indications:  Clinically significant Hemothorax  Pre-operative Diagnosis: Hemothorax  Post-operative Diagnosis: Hemothorax  Procedure Details  Informed consent was obtained for the procedure, including sedation.  Risks of lung perforation, hemorrhage, arrhythmia, and adverse drug reaction were discussed.   Ultrasound was used to identify an acceptable pocket of fluid in the left chest wall, mid axillary line.    After sterile skin prep, using standard technique, a 14 French tube was placed in the left lateral 8th rib space.  Findings: 150 ml of serosanguinous fluid obtained  Estimated Blood Loss:  Minimal         Specimens:  None              Complications:  None; patient tolerated the procedure well.         Disposition: Intubated, stable         Condition: stable  Attending Attestation: I performed the procedure.  Roselie Awkward, MD [place] [facility] Pager: [phone] Cell: [phone] If no response, call [phone]

## 2019-12-14 NOTE — H&P (Signed)
NAME:  Krystal Little, MRN:  [id number], DOB:  09-27-1955, LOS: 0 ADMISSION DATE:  12/11/2019, CONSULTATION DATE:  5/29 REFERRING MD:  Letta Pate, CHIEF COMPLAINT:  Chest congestion   Brief History   22 female with an extensive cardiac history was admitted to [facility] in April in the setting of a right MCA stroke, had a right interventional radiology guided thrombectomy, later had significant brain edema and underwent craniectomy, ultimately transferred to inpatient rehab.  Pulmonary and critical care medicine was consulted on May 29 in the setting of tachypnea and an enlarging left-sided pleural effusion. Thoracentesis was performed which showed a hemothorax.  A CT chest was then ordered showing obstruction of her left mainstem bronchus and a loculated left pleural effusion.  On May 30 she was moved to the ICU for chest tube placement and bronchoscopy.  History of present illness   59 female with an extensive cardiac history was admitted to [facility] in April in the setting of a right MCA stroke, had a right interventional radiology guided thrombectomy, later had significant brain edema and underwent craniectomy, ultimately transferred to inpatient rehab.  Pulmonary and critical care medicine was consulted on May 29 in the setting of tachypnea and an enlarging left-sided pleural effusion. While hospitalized at [facility] she had atrial fibrillation with RVR, cardiology was consulted and amiodarone was used.  She was eventually transitioned to oral anticoagulation.  Eliquis was used.  Since coming to cardiac rehab she has not had significant respiratory complaints.  However, she does note that she has had some increasing chest congestion.  Late at night on May 28 she developed tachypnea so a chest x-ray was ordered which showed a large left pleural effusion which was new.  The last chest x-ray which had been performed was in early May and did not show pleural effusion.  Pulmonary and  critical care medicine was consulted.  She denies dyspnea.  She still has some chest congestion and produces clear sputum.  She denies the sensation of choking on food.  She denies leg swelling or chest pain.  Thoracentesis was performed which showed a hemothorax.  A CT chest was then ordered showing obstruction of her left mainstem bronchus and a loculated left pleural effusion.  On May 30 she was moved to the ICU for chest tube placement and bronchoscopy.  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 Move to ICU for intubation to facilitate bronchoscopy and chest tube placement  Consults:  PCCM  Procedures:  5/29 Thoracentesis 5/30 ETT >  5/30 L pigtail chest tube >   Significant Diagnostic Tests:  CT head 4/18> Worsened edema within the right MCA territory with increased size. of right basal ganglia intraparenchymal hematoma. Increased amount of subarachnoid blood over the right hemisphere. 11 mm of leftward midline shift at the level of the foramen of Monro with early herniation of the right cingulate gyrus beneath the falx cerebri  CT head 4/20 >continued interval evolution of large right MCA subacute infract with continued interval increase in edema and swelling. Petechial hemorrhage throughout the infarct has increased. Similar parenchymal hemorrhage centered within the right basal ganglia.  CT 4/22 -  1. Stable appearance of large right MCA territory infarct withdiffuse petechial hemorrhage. 2. Stable  mass effect and midline shift. 3. Stable mild prominence of the left lateral ventricle.  CT head 4/25 1.  Continued interval evolution of large right MCA territory infarct with associated hemorrhagic transformation, relatively stable as compared to 11/06/2019. No evidence for interval hemorrhage or re-bleeding. Associated mass effect with up to 12 mm of right-to-left shift not significantly changed. Stable asymmetric dilatation of the left lateral ventricle. 2. Right craniectomy without adverse features.  5/29 Thoracentesis left chest >> 8K WBC, 78% WBC, protein 4.0, LDH 182  Micro Data:  RVP 4/16 neg  MRSA PCR 4/17 neg  xxx Blood 4/23 ua 4/23 Trach aspirate 4/23 ======== 5/29 Left pleural fluid culture >  5/30 BAL >   Antimicrobials:  4/24 cefepime > 4/25 4/28 zosyn > 5/4  5/30 zosyn >   Interim history/subjective:   moved to ICU for bronchoscopy, chest tube placement  Objective   Blood pressure (!) 105/58, pulse 64, resp. rate 18, height _0  (1.626 m), weight 61.3 kg, SpO2 100 %.    Vent Mode: PRVC FiO2 (%):  [100 %] 100 % Set Rate:  [18 bmp] 18 bmp Vt Set:  [430 mL] 430 mL PEEP:  [5 cmH20] 5 cmH20 Plateau Pressure:  [33 cmH20] 33 cmH20  No intake or output data in the 24 hours ending 12/08/2019 1213 Filed Weights   12/03/2019 1100  Weight: 61.3 kg    Examination:  General:  In bed, tachypnea, difficulty speaking HEENT: s/p craniotomy NG in place PULM: diminished on left, accessory muscle use CV: RRR, no mgr GI: BS+, soft, nontender MSK: normal bulk, diminished tone on left Neuro: awake, conversant, moves R side well   Resolved Hospital Problem list   R MCA stroke with cerebral edema  Assessment & Plan:  Acute respiratory failure with hypoxemia due to occlusion of the left mainstem bronchus and large left hemothorax Mucus plugging seen on bronchoscopy > likely aspiration Cause of hemothorax uncertain Move to ICU Given increased work of breathing, will need intubation to facilitate bronchoscopy and chest tube placement Intubate Full mechanical vent  support VAP prevention Daily WUA/SBT > consider extubation after procedures 5/30 Bronchoscopy with culture Hold antibiotics for now F/u thoracentesis culture and cytology Place pigtail chest drain If pleural effusion not draining with pigtail, then instill pulmozyme and TPA NPO until discuss situation with speech therapy  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 Will need ongoing PT after extubation  Acute blood loss anemia Monitor for bleeding Transfuse PRBC for Hgb < 7 gm/dL  DM2 with hyperglycemia glargine 20 daily Insulin aspart bid with meals SSI  Hyperlipidemia Lipitor  Essential hypertension Lisinopril Diltiazem  Atrial fibrillation/s/p pacemaker Eliquis on hold for now with hemothorax Diltiazem to continue  Tele  Sleep disturbance Restart melatonin after extubation    Best practice:  Diet: hold tube feeding 5/30 if extubated Pain/Anxiety/Delirium protocol (if indicated): yes, PAD protocol, precedex, prn fentanyl, prn versed RASS target -1 to -2 VAP protocol (if indicated): yes DVT prophylaxis: scd GI prophylaxis: famotidine Glucose control: SSI, glargine Mobility: bed rest Code Status: full Family Communication: I updated her husband on 5/30 Disposition: remain in ICU  Labs   CBC: Recent Labs  Lab 12/08/19 0620 12/12/19 2216 12/13/19 1451  WBC 6.7 9.8  --   NEUTROABS 4.5 7.2  --   HGB 11.3* 10.2*  --   HCT 36.3 32.1* 36.9  MCV 96.5 92.2  --  PLT 199 230  --     Basic Metabolic Panel: Recent Labs  Lab 12/08/19 0620 12/12/19 0726  NA 140 138  K 3.5 3.7  CL 104 100  CO2 26 27  GLUCOSE 133* 100*  BUN 10 11  CREATININE 0.58 0.58  CALCIUM 8.9 8.9   GFR: Estimated Creatinine Clearance: 60.5 mL/min (by C-G formula based on SCr of 0.58 mg/dL). Recent Labs  Lab 12/08/19 0620 12/12/19 2216  WBC 6.7 9.8    Liver Function Tests: No results for input(s): AST, ALT, ALKPHOS, BILITOT, PROT, ALBUMIN in the last 168 hours. No  results for input(s): LIPASE, AMYLASE in the last 168 hours. No results for input(s): AMMONIA in the last 168 hours.  ABG    Component Value Date/Time   PHART 7.558 (H) 11/09/2019 1711   PCO2ART 40.2 11/09/2019 1711   PO2ART 64 (L) 11/09/2019 1711   HCO3 35.5 (H) 11/09/2019 1711   TCO2 37 (H) 11/09/2019 1711   ACIDBASEDEF 2.0 11/02/2019 1202   O2SAT 94.0 11/09/2019 1711     Coagulation Profile: No results for input(s): INR, PROTIME in the last 168 hours.  Cardiac Enzymes: No results for input(s): CKTOTAL, CKMB, CKMBINDEX, TROPONINI in the last 168 hours.  HbA1C: HbA1c, POC (controlled diabetic range)  Date/Time Value Ref Range Status  08/18/2019 10:45 AM 12.2 (A) 0.0 - 7.0 % Final  02/04/2018 04:20 PM 8.0 (A) 0.0 - 7.0 % Final   Hgb A1c MFr Bld  Date/Time Value Ref Range Status  11/01/2019 03:48 AM 9.7 (H) 4.8 - 5.6 % Final    Comment:    (NOTE) Pre diabetes:          5.7%-6.4% Diabetes:              >6.4% Glycemic control for   <7.0% adults with diabetes     CBG: Recent Labs  Lab 12/13/19 0612 12/13/19 1135 12/13/19 1659 12/13/19 2119 12/15/2019 0616  GLUCAP 80 138* 261* 286* 255*    Review of Systems:   Gen: Denies fever, chills, weight change, fatigue, night sweats HEENT: Denies blurred vision, double vision, hearing loss, tinnitus, sinus congestion, rhinorrhea, sore throat, neck stiffness, dysphagia PULM: per HPI CV: Denies chest pain, edema, orthopnea, paroxysmal nocturnal dyspnea, palpitations GI: Denies abdominal pain, nausea, vomiting, diarrhea, hematochezia, melena, constipation, change in bowel habits GU: Denies dysuria, hematuria, polyuria, oliguria, urethral discharge Endocrine: Denies hot or cold intolerance, polyuria, polyphagia or appetite change Derm: Denies rash, dry skin, scaling or peeling skin change Heme: Denies easy bruising, bleeding, bleeding gums Neuro: per HPI   Past Medical History  She,  has a past medical history of Chronic  atrial fibrillation ([place]) (1990s), Diabetes mellitus type 2, uncontrolled, without complications, Essential hypertension, History of cardiac catheterization (2001), sick sinus syndrome (07/1999), S/P MVR (mitral valve repair) (08/09/1999), and 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   Surgical History    Past Surgical History:  Procedure Laterality Date  . CARDIAC CATHETERIZATION  08/03/1999   [place] - normal Coronaries; no sign of constriction or restrictive cardiomypathy  . CRANIECTOMY FOR DEPRESSED SKULL FRACTURE Right 11/02/2019   Procedure: DECOMPRESSIVE HEMI -CRANIECTOMY, IMPLANTATION OF SKULL FLAP TO RIGHT ABDOMEN;  Surgeon: Consuella Lose, MD;  Location: [facility];  Service: Neurosurgery;  Laterality: Right;  . IR CT HEAD LTD  10/31/2019  . IR PERCUTANEOUS ART THROMBECTOMY/INFUSION INTRACRANIAL INC DIAG ANGIO  10/31/2019      . IR PERCUTANEOUS ART THROMBECTOMY/INFUSION INTRACRANIAL INC DIAG ANGIO  10/31/2019  . MITRAL VALVE  REPAIR  07/1999   Both Ant& Post leaflet repair - quadrangular resection&caudal transposition, #32 Sequin Annuloplasty ring  . PACEMAKER GENERATOR CHANGE  11/26/2008   Medtronic Adapta L(? if it has been checked since 02/2013)  . PACEMAKER INSERTION  07/1999   for SSS in setting of Chronic Afib; Medtronic Kappa [id number], [id number] H.  Marland Kitchen RADIOLOGY WITH ANESTHESIA N/A 10/31/2019   Procedure: IR WITH ANESTHESIA;  Surgeon: Radiologist, Medication, MD;  Location: [facility];  Service: Radiology;  Laterality: N/A;  . TRANSTHORACIC ECHOCARDIOGRAM  05/2018   Normal LV size and thickness.  EF estimated 50%.  Inferobasal HK and flattened septum c/w with elevated PA pressures. Mild functional mitral stenosis at rest (postoperative).  Moderate left atrial dilation and mild right atrial dilation.  Mean PA pressures estimated 52 mmHg.     Social History   reports that she has never smoked. She has never used smokeless tobacco. She reports current alcohol use. She reports that she does not  use drugs.   Family History   Her family history includes Cancer in her brother; Diabetes in her sister; Hypertension in her mother; Lung cancer in her brother.   Allergies No Known Allergies   Home Medications  Prior to Admission medications   Medication Sig Start Date End Date Taking? Authorizing Provider  acetaminophen (TYLENOL) 500 MG tablet Take 500-1,000 mg by mouth every 6 (six) hours as needed for headache (pain).    [provider]  amantadine (SYMMETREL) 50 MG/5ML solution Place 10 mLs (100 mg total) into feeding tube 2 (two) times daily. 11/20/19   Donzetta Starch, NP  Amino Acids-Protein Hydrolys (FEEDING SUPPLEMENT, PRO-STAT SUGAR FREE 64,) LIQD Take 30 mLs by mouth 2 (two) times daily. 11/20/19   Donzetta Starch, NP  aspirin 325 MG tablet Place 1 tablet (325 mg total) into feeding tube daily. 11/21/19   Donzetta Starch, NP  atorvastatin (LIPITOR) 40 MG tablet Place 1 tablet (40 mg total) into feeding tube daily. 11/21/19   Donzetta Starch, NP  Chlorhexidine Gluconate Cloth 2 % PADS Apply 6 each topically daily at 6 (six) AM. 11/21/19   Donzetta Starch, NP  chlorhexidine gluconate, MEDLINE KIT, (PERIDEX) 0.12 % solution 15 mLs by Mouth Rinse route 2 (two) times daily. 11/20/19   Donzetta Starch, NP  diltiazem (CARDIZEM) 10 mg/ml oral suspension Place 6 mLs (60 mg total) into feeding tube every 6 (six) hours. 11/20/19   Donzetta Starch, NP  enoxaparin (LOVENOX) 40 MG/0.4ML injection Inject 0.4 mLs (40 mg total) into the skin daily. 11/20/19   Donzetta Starch, NP  insulin aspart (NOVOLOG) 100 UNIT/ML injection Inject 0-9 Units into the skin 3 (three) times daily with meals. 11/20/19   Donzetta Starch, NP  insulin aspart (NOVOLOG) 100 UNIT/ML injection Inject 8 Units into the skin 2 (two) times daily. 11/20/19   Donzetta Starch, NP  insulin glargine (LANTUS) 100 UNIT/ML injection Inject 0.2 mLs (20 Units total) into the skin 2 (two) times daily. 11/20/19   Donzetta Starch, NP  Maltodextrin-Xanthan Gum  (RESOURCE THICKENUP CLEAR) POWD Take 120 g by mouth as needed (nectar thick liquids). 11/20/19   Donzetta Starch, NP  metoprolol tartrate (LOPRESSOR) 25 MG tablet Place 1 tablet (25 mg total) into feeding tube 2 (two) times daily. 11/20/19   Donzetta Starch, NP  Nutritional Supplements (FEEDING SUPPLEMENT, GLUCERNA 1.5 CAL,) LIQD Place 500 mLs into feeding tube daily. 11/20/19   Donzetta Starch, NP  polyethylene  glycol (MIRALAX / GLYCOLAX) 17 g packet Place 17 g into feeding tube 2 (two) times daily. 11/20/19   Donzetta Starch, NP  senna-docusate (SENOKOT-S) 8.6-50 MG tablet Place 1 tablet into feeding tube at bedtime as needed for mild constipation. 11/20/19   Donzetta Starch, NP  senna-docusate (SENOKOT-S) 8.6-50 MG tablet Place 1 tablet into feeding tube 2 (two) times daily. 11/20/19   Donzetta Starch, NP     Critical care time: 45 minutes    Roselie Awkward, MD [facility]M Pager: [phone] Cell: [phone] If no response, call [phone]

## 2019-12-14 NOTE — Progress Notes (Signed)
90mcg Fentanyl and 1mg  Versed wasted in stericycle with Allena Katz, RN.

## 2019-12-14 NOTE — Progress Notes (Signed)
[facility] P[facility]M  Worsening hypotension despite holding sedation and giving 500cc LR bolus Give another 1000 LR bolus PICC line ordered Start levophed infusion Start tube feeding  Roselie Awkward, MD [place]M Pager: [phone] Cell: [phone] If no response, call [phone]

## 2019-12-14 NOTE — Plan of Care (Signed)
  Problem: Clinical Measurements: Goal: Respiratory complications will improve Outcome: Progressing Goal: Cardiovascular complication will be avoided Outcome: Progressing   Problem: Coping: Goal: Level of anxiety will decrease Outcome: Progressing   Problem: Pain Managment: Goal: General experience of comfort will improve Outcome: Progressing   Problem: Elimination: Goal: Will not experience complications related to urinary retention Outcome: Not Progressing Note: Despite 2L of LR, pt remains anuric

## 2019-12-14 NOTE — Discharge Summary (Signed)
Physician Discharge Summary  Patient ID: Krystal Little MRN: [id number] DOB/AGE: 1956-04-17 64 y.o.  Admit date: 11/20/2019 Discharge date: 12/12/2019  Discharge Diagnoses:  Principal Problem:   Right middle cerebral artery stroke [facility]) Active Problems:   DM (diabetes mellitus), type 2, uncontrolled ([place])   Long term current use of anticoagulant therapy   S/P placement of cardiac pacemaker   Status post craniectomy   Dysphagia, post-stroke   Atrial fibrillation (HCC)   Left hemiparesis (HCC)   Labile blood glucose   Benign essential HTN   Essential hypertension   Labile blood pressure Hemothorax  Discharged Condition: Guarded  Significant Diagnostic Studies: DG Chest 1 View  Result Date: 11/19/2019 CLINICAL DATA:  Difficulty airway for intubation. EXAM: CHEST  1 VIEW COMPARISON:  Dec 14, 2019 FINDINGS: The ETT terminates 3 cm above the carina in good position. A left-sided chest tube is identified. Near complete opacification of the left chest is again identified with a probable large associated effusion and underlying opacity. No pneumothorax on the right. The right lung is clear. The cardiomediastinal silhouette is unchanged. IMPRESSION: 1. Support apparatus as above. 2. Continued near complete opacification of the left hemithorax. Electronically Signed   By: Dorise Bullion III M.D   On: 11/28/2019 13:16   DG Chest 2 View  Result Date: 12/12/2019 CLINICAL DATA:  Tachypnea EXAM: CHEST - 2 VIEW COMPARISON:  11/15/2019 FINDINGS: Post sternotomy changes. Right-sided pacing device as before. Right lung is grossly clear. Large left-sided pleural effusion, increased compared to prior with small amount of aerated lung at the apex. Enlarged cardiomediastinal silhouette. No pneumothorax. IMPRESSION: 1. Large left-sided pleural effusion, increased compared to prior with minimal aerated lung at the left apex. 2. Cardiomegaly Electronically Signed   By: Donavan Foil M.D.   On: 12/12/2019 20:54    DG Abd 1 View  Result Date: 12/07/2019 CLINICAL DATA:  OG tube placement EXAM: ABDOMEN - 1 VIEW COMPARISON:  November 09, 2019 FINDINGS: The distal tip of the OG tube is in the right mid abdomen, likely in the distal stomach. IMPRESSION: The distal tip of the OG tube is likely in the distal stomach. Electronically Signed   By: Dorise Bullion III M.D   On: 11/18/2019 15:13   CT HEAD WO CONTRAST  Result Date: 11/21/2019 CLINICAL DATA:  Recent CVA with edema EXAM: CT HEAD WITHOUT CONTRAST TECHNIQUE: Contiguous axial images were obtained from the base of the skull through the vertex without intravenous contrast. COMPARISON:  Nov 16, 2019 FINDINGS: Brain: In comparison with the most recent study, there is less mass effect on the right lateral ventricle. There is no longer a face mint of the third ventricle. There is 3 mm of midline shift toward the left, slightly less than on most recent study. Decreased attenuation is seen throughout the right mid and posterior frontal lobe regions as well as much of the right temporal lobe, stable, as well as cytotoxic edema throughout essentially the entire right basal ganglia, stable. Scattered areas of hemorrhage within this infarct remain. A more discrete area of hemorrhage seen previously in the periphery of the anterior right temporal lobe has essentially resolved. No new hemorrhage evident. No new infarct is evident. No subdural or epidural fluid collections are evident. Vascular: No appreciable hyperdense vessel. There is calcification in each carotid siphon region. Skull: Right frontal and cranial bony defects are again noted without change. No new bony defects. Sinuses/Orbits: There is mucosal thickening in several ethmoid air cells. Other visualized paranasal sinuses  are clear. Orbits appear symmetric bilaterally. Other: Mastoid air cells are clear. IMPRESSION: 1. Less mass effect on the right lateral and third ventricles. 3 mm of midline shift to the left, less than  on most recent study. 2. Further decrease in hemorrhage in the right temporal lobe compared to previous study. Scattered areas of hemorrhage noted, primarily in the periphery of the large right hemispheric infarct. 3. Distribution of infarct is essentially stable involving much of the mid and posterior right frontal lobe as well as much of the right temporal lobe and essentially the entire right basal ganglia region involve. No new infarct evident. 4.  Sizable postoperative bony defect on the right, stable. 5.  Foci of arterial vascular calcification noted. 6.  Mucosal thickening in several ethmoid air cells. Electronically Signed   By: Lowella Grip III M.D.   On: 11/21/2019 13:56   CT HEAD WO CONTRAST  Result Date: 11/16/2019 CLINICAL DATA:  Stroke. Postop. EXAM: CT HEAD WITHOUT CONTRAST TECHNIQUE: Contiguous axial images were obtained from the base of the skull through the vertex without intravenous contrast. COMPARISON:  11/09/2019 FINDINGS: Brain: There is decreased right hemispheric edema with decreased leftward midline shift that now measures 5 mm, previously 10 mm. Decreased density of hemorrhage in the right hemisphere. Vascular: Unchanged Skull: Large right craniectomy, unchanged. Sinuses/Orbits: No fluid levels or advanced mucosal thickening of the visualized paranasal sinuses. No mastoid or middle ear effusion. The orbits are normal. IMPRESSION: Decreased right hemispheric edema and decreased leftward midline shift. Electronically Signed   By: Ulyses Jarred M.D.   On: 11/16/2019 04:42   CT CHEST WO CONTRAST  Result Date: 11/19/2019 CLINICAL DATA:  Hemothorax unexplained.  On anticoagulation. EXAM: CT CHEST WITHOUT CONTRAST TECHNIQUE: Multidetector CT imaging of the chest was performed following the standard protocol without IV contrast. COMPARISON:  Radiograph yesterday, additional priors. Remote chest CT 12/20/2009 FINDINGS: Cardiovascular: Pacemaker with leads in the right atrium and  ventricle. Thoracic aorta is normal in caliber. There is multi chamber cardiomegaly, with prominent left atrial dilatation. Coronary artery calcifications. Mitral annulus calcifications. No definite pericardial effusion, fluid abutting the superior heart border felt to be loculated pleural effusion. Mediastinum/Nodes: Limited assessment for adenopathy. Lack of IV contrast as well as streak artifact from right-sided pacemaker. There is a 12 mm lower paratracheal node. The esophagus is displaced posteriorly by left atrial enlargement. No visualized thyroid nodule. Lungs/Pleura: Motion artifact limits assessment. Left mainstem bronchus is completely opacified with lobulated density just below the carina. Left lower lobe bronchus is completely opacified. Minimal air within the segmental branch of the left lower lobe, however no significant aerated lung. Left upper lobe bronchus is opacified. Minimal air within the segmental bronchus of the anterior upper lobe. Small portion of left upper lobe is aerated. Ground-glass and dependent opacities within the aerated left upper lobe. Moderate size left pleural effusion which is partially loculated. Density measurements of the pleural fluid are limited given streak artifact from pacemaker, however does not appear increased density such is hemothorax. No pneumothorax. Ground-glass opacity in the periphery of the right upper lobe, series 4, image 23. Right lung otherwise clear. There is no right pleural effusion. Upper Abdomen: Coarse calcification in the spleen, also seen on prior exam. Cirrhotic hepatic morphology. Subcapsular 13 mm low-density in the right lobe is likely cyst, but incompletely characterized. There is left adrenal thickening without dominant nodule. Musculoskeletal: Median sternotomy. No evidence of focal bone lesion or acute osseous abnormality. IMPRESSION: 1. Moderate size partially loculated left  pleural effusion. 2. Significant filling of the left bronchial  tree, completely filling the left mainstem bronchus, left lower lobe bronchus, and incomplete filling the left upper lobe bronchus. This may represent aspiration. Recommend bronchoscopy for further evaluation to exclude possibility of underlying obstructive mass. 3. Complete collapse of the left lower lobe. Left upper lobe demonstrates ground-glass opacity which may be compressive atelectasis or pneumonia. 4. Small focal ground-glass opacity in the periphery of the right upper lobe is nonspecific. 5. Multi chamber cardiomegaly with prominent left atrial enlargement which causes mass effect on the adjacent esophagus. 6. Cirrhosis. Aortic Atherosclerosis (ICD10-I70.0). Electronically Signed   By: Keith Rake M.D.   On: 12/06/2019 03:25   DG CHEST PORT 1 VIEW  Result Date: 11/25/2019 CLINICAL DATA:  PICC line EXAM: PORTABLE CHEST 1 VIEW COMPARISON:  Chest radiograph 11/28/2019 FINDINGS: Interval placement of a left upper extremity PICC with tip projecting in the upper right atrium. Otherwise stable support apparatus including endotracheal tube, nasogastric tube, left chest tube and right chest pacer. Stable cardiomediastinal contours. There is persistent complete opacification of the left hemithorax. The right lung is clear. No evidence of pneumothorax. No acute finding in the visualized skeleton. IMPRESSION: 1. Interval placement of a left upper extremity PICC with tip projecting in the upper right atrium. 2. Persistent complete opacification of the left hemithorax. These results will be called to the ordering clinician or representative by the Radiologist Assistant, and communication documented in the PACS or [employer]. Electronically Signed   By: Audie Pinto M.D.   On: 12/07/2019 16:14   DG CHEST PORT 1 VIEW  Result Date: 11/30/2019 CLINICAL DATA:  Respiratory distress EXAM: PORTABLE CHEST 1 VIEW COMPARISON:  Dec 12, 2017 chest radiograph; chest CT Dec 14, 2019 FINDINGS: There is extensive  opacification throughout most of the left lung with only a small amount of aeration in the left apex. Right lung is clear. There is stable cardiomegaly with pacemaker leads attached to the right atrium and right ventricle. No adenopathy evident in areas that can be assessed for potential adenopathy. No bone lesions. IMPRESSION: Near complete opacification of the left lung, likely due to combination of consolidation and effusion. Note that CT earlier in the day showed apparent fluid in the left main bronchus. Question aspiration with mucous plugging causing the opacification on the left. This finding may warrant bronchoscopy given the current radiographic findings and recent CT appearance. Right lung clear.  Stable cardiac prominence. Electronically Signed   By: Lowella Grip III M.D.   On: 11/16/2019 08:26   DG CHEST PORT 1 VIEW  Result Date: 12/13/2019 CLINICAL DATA:  Pleural effusion.  Recent thoracentesis EXAM: PORTABLE CHEST 1 VIEW COMPARISON:  Dec 12, 2019 and may 03/05/2020 FINDINGS: No pneumothorax. Left pleural effusion smaller compared to 1 day prior. There is residual pleural effusion on the left with the patchy consolidation throughout portions of the left mid and lower lung regions. Right lung clear. Heart size and pulmonary vascularity are normal. Pacemaker leads are attached to the right atrium and right ventricle. No adenopathy. No bone lesions. IMPRESSION: Left pleural effusion smaller than 1 day prior. No pneumothorax. There is a combination of residual pleural effusion as well as consolidation throughout the left mid and lower lung zone regions. Right lung clear. Stable cardiac silhouette. Postoperative changes noted. Electronically Signed   By: Lowella Grip III M.D.   On: 12/13/2019 13:36   DG CHEST PORT 1 VIEW  Result Date: 11/15/2019 CLINICAL DATA:  Line placement  EXAM: PORTABLE CHEST 1 VIEW COMPARISON:  11/11/2019 FINDINGS: The enteric tube extends below the left hemidiaphragm.  The tip is not visualized on this exam. There is a left-sided PICC line that is stable in positioning. There is a right-sided dual chamber pacemaker in place with stable positioning of the leads. The heart size is stable. There are bilateral pleural effusions, left greater than right. There is a dense retrocardiac opacity favored to represent atelectasis. There is no pneumothorax. There is mild volume overload without overt pulmonary edema. IMPRESSION: 1. Lines and tubes as above. 2. Persistent bilateral pleural effusions, left greater than right. 3. Persistent retrocardiac opacity favored to represent atelectasis. Electronically Signed   By: Constance Holster M.D.   On: 11/15/2019 17:20   DG Swallowing Func-Speech Pathology  Result Date: 12/03/2019 Objective Swallowing Evaluation: Type of Study: MBS-Modified Barium Swallow Study  Patient Details Name: ALEXANDREA WESTERGARD MRN: [id number] Date of Birth: [birth year] Today's Date: 12/03/2019 Past Medical History: Past Medical History: Diagnosis Date . Chronic atrial fibrillation ([place]) 1990s  s/p DCCV then attempted ablation of complex A Flutter (@ [facility] - Dr. Deno Etienne), failed antiarrhythmics --> INR folllwed @ [facility] ->> now status post pacemaker placement with underlying A. fib. . Diabetes mellitus type 2, uncontrolled, without complications   On Oral Medications ([facility]) . Essential hypertension  . History of cardiac catheterization 2001  R&LHC - normal Coronaries, no evidence of Restictive Cardiomyopathy or Constrictive Pericarditis (also @ [facility]) . Hx of sick sinus syndrome 07/1999  Wtih symptomatic bradycardia - syncope ([place]) . 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 CARDIAC CATHETERIZATION  08/03/1999  [place] - normal Coronaries; no sign of constriction or restrictive cardiomypathy . CRANIECTOMY FOR DEPRESSED SKULL FRACTURE Right 11/02/2019  Procedure: DECOMPRESSIVE HEMI -CRANIECTOMY, IMPLANTATION OF SKULL FLAP TO RIGHT ABDOMEN;  Surgeon: Consuella Lose, MD;  Location: [place];  Service: Neurosurgery;  Laterality: Right; . IR CT HEAD LTD  10/31/2019 . IR PERCUTANEOUS ART THROMBECTOMY/INFUSION INTRACRANIAL INC DIAG ANGIO  10/31/2019    . IR PERCUTANEOUS ART THROMBECTOMY/INFUSION INTRACRANIAL INC DIAG ANGIO  10/31/2019 . MITRAL VALVE REPAIR  07/1999  Both Ant& Post leaflet repair - quadrangular resection&caudal transposition, #32 Sequin Annuloplasty ring . PACEMAKER GENERATOR CHANGE  11/26/2008  Medtronic Adapta L(? if it has been checked since 02/2013) . PACEMAKER INSERTION  07/1999  for SSS in setting of Chronic Afib; Medtronic Kappa [id number], [id number] H. Marland Kitchen RADIOLOGY WITH ANESTHESIA N/A 10/31/2019  Procedure: IR WITH ANESTHESIA;  Surgeon: Radiologist, Medication, MD;  Location: [place];  Service: Radiology;  Laterality: N/A; . TRANSTHORACIC ECHOCARDIOGRAM  05/2018  Normal LV size and thickness.  EF estimated 50%.  Inferobasal HK and flattened septum c/w with elevated PA pressures. Mild functional mitral stenosis at rest (postoperative).  Moderate left atrial dilation and mild right atrial dilation.  Mean PA pressures estimated 52 mmHg. HPI: 64 y.o. female presented to [facility] ED as a code stroke for R gaze deviation and L-sided weakness. CT revealed a large R MCA infarct. Pt s/p R hemicraniectomy with bone flap placed in abdomen on 4/18. ETT 4/18-4/25. PMH HTN,  DM2, chronic a. Fib (coumadin) s/p pace maker. Pt admitted to [facility] 11/20/19.  Assessment / Plan / Recommendation CHL IP CLINICAL IMPRESSIONS 12/03/2019 Clinical Impression Pt continues to present  with mild pharyngeal and moderate oral dysphagia, however improved ability to protect airway in comparison to previous MBSS. No aspiration events were observed throughout today's study. Although thin barium pools in the pyriform sinuses prior to swallow initiation, only 1 instance of trace penetration was noted. Penetration above the level of the vocal folds also observed with dual consistency PO. Also of note, pt has been tolerating free water protocol without clinical signs of respiratory distress and very minimal s/sx aspiration. Pt's oral phase impairments include decreased bolus cohesion, reduced AP transit of advanced solids and dual consistencies, and anterior loss. Pt's mastication was more efficient and more complete oral clearance achieved with Dysphagia 2 (minced) textures in comparison to Dysphagia 3 (mech soft). Pt ultimately expectorated barium pill after 2 unsuccessful attempts to deglutate pill with thin barium due to decreased bolus cohesion and AP transit. Would recommend pt upgrade to Dysphagia 2 (minced/ground) solids and thin liquids, avoid dual consistency POs (ex: cereal, fruit cocktail, etc.), and continue medications crushed in puree. Full supervision should still be provided during meals. Encourage use of straw and self feeding. SLP Visit Diagnosis Dysphagia, oropharyngeal phase (R13.12) Attention and concentration deficit following -- Frontal lobe and executive function deficit following -- Impact on safety and function Mild aspiration risk   CHL IP TREATMENT RECOMMENDATION 11/14/2019 Treatment Recommendations Therapy as outlined in treatment plan below   Prognosis 11/14/2019 Prognosis for Safe Diet Advancement Good Barriers to Reach Goals -- Barriers/Prognosis Comment -- CHL IP DIET RECOMMENDATION 12/03/2019 SLP Diet Recommendations Dysphagia 2 (Fine chop) solids;Thin liquid Liquid Administration via Straw;Cup Medication Administration Crushed with puree Compensations Slow rate;Small  sips/bites;Minimize environmental distractions Postural Changes Remain semi-upright after after feeds/meals (Comment);Seated upright at 90 degrees   CHL IP OTHER RECOMMENDATIONS 12/03/2019 Recommended Consults -- Oral Care Recommendations Oral care BID Other Recommendations Have oral suction available   CHL IP FOLLOW UP RECOMMENDATIONS 11/19/2019 Follow up Recommendations Inpatient Rehab   CHL IP FREQUENCY AND DURATION 11/14/2019 Speech Therapy Frequency (ACUTE ONLY) min 2x/week Treatment Duration 2 weeks      CHL IP ORAL PHASE 12/03/2019 Oral Phase Impaired Oral - Pudding Teaspoon -- Oral - Pudding Cup -- Oral - Honey Teaspoon -- Oral - Honey Cup -- Oral - Nectar Teaspoon NT Oral - Nectar Cup NT Oral - Nectar Straw NT Oral - Thin Teaspoon NT Oral - Thin Cup Lingual/palatal residue;Piecemeal swallowing;Delayed oral transit;Premature spillage Oral - Thin Straw Premature spillage;Piecemeal swallowing Oral - Puree NT Oral - Mech Soft Delayed oral transit;Decreased bolus cohesion;Impaired mastication;Weak lingual manipulation;Lingual pumping Oral - Regular -- Oral - Multi-Consistency Piecemeal swallowing;Decreased bolus cohesion Oral - Pill Reduced posterior propulsion;Lingual pumping Oral Phase - Comment --  CHL IP PHARYNGEAL PHASE 12/03/2019 Pharyngeal Phase Impaired Pharyngeal- Pudding Teaspoon -- Pharyngeal -- Pharyngeal- Pudding Cup -- Pharyngeal -- Pharyngeal- Honey Teaspoon -- Pharyngeal -- Pharyngeal- Honey Cup -- Pharyngeal -- Pharyngeal- Nectar Teaspoon NT Pharyngeal -- Pharyngeal- Nectar Cup -- Pharyngeal -- Pharyngeal- Nectar Straw NT Pharyngeal -- Pharyngeal- Thin Teaspoon NT Pharyngeal -- Pharyngeal- Thin Cup -- Pharyngeal -- Pharyngeal- Thin Straw Delayed swallow initiation-pyriform sinuses;Penetration/Aspiration before swallow Pharyngeal Material enters airway, CONTACTS cords and then ejected out Pharyngeal- Puree NT Pharyngeal -- Pharyngeal- Mechanical Soft Delayed swallow initiation-vallecula Pharyngeal --  Pharyngeal- Regular -- Pharyngeal -- Pharyngeal- Multi-consistency Delayed swallow initiation-vallecula;Penetration/Aspiration during swallow Pharyngeal Material enters  airway, remains ABOVE vocal cords then ejected out Pharyngeal- Pill Other (Comment) Pharyngeal -- Pharyngeal Comment --  CHL IP CERVICAL ESOPHAGEAL PHASE 12/03/2019 Cervical Esophageal Phase WFL Pudding Teaspoon -- Pudding Cup -- Honey Teaspoon -- Honey Cup -- Nectar Teaspoon -- Nectar Cup -- Nectar Straw -- Thin Teaspoon -- Thin Cup -- Thin Straw -- Puree -- Mechanical Soft -- Regular -- Multi-consistency -- Pill -- Cervical Esophageal Comment -- Krystal Little 12/03/2019, 9:50 AM              [place] EKG SITE RITE  Result Date: 11/22/2019 If Site Rite image not attached, placement could not be confirmed due to current cardiac rhythm.   Labs:  Basic Metabolic Panel: Recent Labs  Lab 12/08/19 0620 12/12/19 0726 11/26/2019 1306  NA 140 138 133*  K 3.5 3.7 5.4*  CL 104 100 95*  CO2 _0 GLUCOSE 133* 100* 240*  BUN 10 11 28*  CREATININE 0.58 0.58 1.89*  CALCIUM 8.9 8.9 8.6*  MG  --   --  1.7  PHOS  --   --  6.7*    CBC: Recent Labs  Lab 12/08/19 0620 12/08/19 0620 12/12/19 2216 12/13/19 1451 11/29/2019 1306  WBC 6.7  --  9.8  --  25.2*  NEUTROABS 4.5  --  7.2  --   --   HGB 11.3*  --  10.2*  --  11.6*  HCT 36.3   < > 32.1* 36.9 36.8  MCV 96.5  --  92.2  --  94.4  PLT 199  --  230  --  221   < > = values in this interval not displayed.    CBG: Recent Labs  Lab 12/13/19 1659 12/13/19 2119 11/20/2019 0616 11/19/2019 1257 12/10/2019 1605  GLUCAP 261* 286* 255* 189* 212*   Family history.  Mother with hypertension.  Sister with diabetes.  Brother with rectal cancer.  Brother with lung cancer.  Denies any esophageal cancer diabetes or hyperlipidemia  Brief HPI:   Krystal Little is a 63 y.o. right-handed female with history of hypertension, diabetes mellitus, chronic atrial fibrillation on chronic Coumadin with  pacemaker and mitral valve repair.  Per chart review independent and active prior to admission.  Patient lives with spouse.  Presented 10/31/2019 with left-sided weakness neglect and right gaze deviation.  Admission chemistries with hemoglobin 14.3 glucose 214 INR 1.2 urine drug screen negative.  Cranial CT scan as well as CT angiogram of head and neck showed a large right MCA territory nonhemorrhagic infarction.  Hyperdense right MCA suggesting extensive thrombus from the right ICA terminus to the MCA bifurcation.  Echocardiogram with ejection fraction of 55%.  Patient underwent endovascular thrombectomy revascularization per interventional radiology.  Follow-up imaging showed worsening edema involving the right middle cerebral artery causing significant radiologic mass-effect and patient underwent decompressive right hemicraniectomy placement of bone flap into abdominal subcutaneous pocket 11/02/2019 per Dr. Kathyrn Sheriff.  Patient was cleared to begin subcutaneous Lovenox for DVT prophylaxis.  CT scan follow-up 11/16/2019 decreased right hemispheric edema and decreased left midline shift.  Initially maintained on Cleviprex for blood pressure control.  Follow-up cardiology services for atrial fibrillation maintained on Cardizem as well as Lopressor her Coumadin had been discontinued at present with aspirin initiated 11/17/2019 consideration being made to resume anticoagulation at the discretion of neurosurgery.  Acute blood loss anemia 9.7 and monitored.  She was on a dysphagia #1 nectar thick liquid diet with nasogastric tube feeds for nutritional support.  Patient  was admitted for a comprehensive rehab program   Hospital Course: Krystal Little was admitted to rehab 11/20/2019 for inpatient therapies to consist of PT, ST and OT at least three hours five days a week. Past admission physiatrist, therapy team and rehab RN have worked together to provide customized collaborative inpatient rehab.  Pertaining to patient's  large right MCA infarction due to right ICA occlusion she had undergone revascularization endovascular thrombectomy complicated by worsening edema mass-effect necessitating need for decompressive right hemicraniectomy placement of bone flap into abdominal subcutaneous pocket 11/02/2019.  Repeat head CT scan reviewed showing some improvement.  She was using a helmet when out of bed for safety.  In regards to patient's atrial fibrillation pacemaker she was on chronic Coumadin prior to hospital admission since discontinued after hemicraniectomy she was on low-dose aspirin she had been cleared for Eliquis cardiac rate remained controlled.  Rehab course amantadine had been added to help patient focus to tasks and improve attention.  Her hemoglobin remained stable at 11.3.  Blood sugars monitored hemoglobin A1c of 9.7 insulin therapy as directed.  Her diet has been advanced to dysphagia #2 thin liquid.  On the afternoon of 12/13/2019 critical care consulted in the setting of tachypnea and enlarging left side pleural effusion identified on chest x-ray.  Thoracentesis was performed showed hemothorax.  Chest tube was then ordered showing obstruction of her left mainstream bronchus and a loculated left pleural effusion.  She was discharged to ICU for chest tube placement bronchoscopy under the care of critical care services.   Blood pressures were monitored on TID basis and soft and monitored  Diabetes has been monitored with ac/hs CBG checks and SSI was use prn for tighter BS control.   She has made gains during rehab stay and is attending therapies  Krystal Little will continue to receive follow up therapies   after discharge  Rehab course: During patient's stay in rehab weekly team conferences were held to monitor patient's progress, set goals and discuss barriers to discharge. At admission, patient required max assist for rolling in the bed moderate assist sit to stand.  Total assist side-lying to sitting total assist sit  to side-lying  Physical exam.  Blood pressure 144/71 pulse 62 temperature 98 respirations 17 oxygen saturation 90% room air Constitutional.  Patient was lethargic limited participation kept her eyes closed H EENT Craniotomy site with staples intact Left facial droop at rest Eyes.  Pupils round and reactive to light no discharge.nystagmus Cardiac irregular irregular without murmur Abdomen.  Soft nontender positive bowel sounds without rebound Respiratory effort normal no respiratory distress without wheeze GI.  Soft nontender positive bowel sounds without rebound Musculoskeletal.  Right side she was able to squeeze right hands do wrist extension dorsi plantarflexion 4/5 Left upper extremity/left lower extremity when asked to move, 0/5 however could have been due to not following commands.  Krystal Little  has had improvement in activity tolerance, balance, postural control as well as ability to compensate for deficits. Krystal Little has had improvement in functional use RUE/LUE  and RLE/LLE as well as improvement in awareness.  Generally required moderate assist for functional bed mobility and squat pivot transfers.  Able to roll to the left with min assist but still needed moderate assist for rolling to the right.  Total assist for donning shoes AFO and helmet.  Focus sessions on gait training and light of limited endurance.  Tolerated training of 23 mod max assist.  Patient continued to require mod assist for upper body self-care  max assist for lower body self-care.       Disposition: Discharge to ICU    Diet: N.p.o.  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   amantadine 50 MG/5ML solution Commonly known as: SYMMETREL Place 10 mLs (100 mg total) into feeding tube 2 (two) times daily.   aspirin 325 MG tablet Place 1 tablet (325 mg total) into feeding tube daily.   atorvastatin 40 MG tablet Commonly known as: LIPITOR Place 1 tablet (40 mg total) into feeding tube daily.   chlorhexidine gluconate (MEDLINE KIT) 0.12 % solution Commonly known as: PERIDEX 15 mLs by Mouth Rinse route 2 (two) times daily.   Chlorhexidine Gluconate Cloth 2 % Pads Apply 6 each topically daily at 6 (six) AM.   diltiazem 10 mg/ml  oral suspension Commonly known as: CARDIZEM Place 6 mLs (60 mg total) into feeding tube every 6 (six) hours.   enoxaparin 40 MG/0.4ML injection Commonly known as: LOVENOX Inject 0.4 mLs (40 mg total) into the skin daily.   feeding supplement (GLUCERNA 1.5 CAL) Liqd Place 500 mLs into feeding tube daily.   feeding supplement (PRO-STAT SUGAR FREE 64) Liqd Take 30 mLs by mouth 2 (two) times daily.   insulin aspart 100 UNIT/ML injection Commonly known as: novoLOG Inject 0-9 Units into the skin 3 (three) times daily with meals.   insulin aspart 100 UNIT/ML injection Commonly known as: novoLOG Inject 8 Units into the skin 2 (two) times daily.   insulin glargine 100 UNIT/ML injection Commonly known as: LANTUS Inject 0.2 mLs (20 Units total) into the skin 2 (two) times daily.   metoprolol tartrate 25 MG tablet Commonly known as: LOPRESSOR Place 1 tablet (25 mg total) into feeding tube 2 (two) times daily.   polyethylene glycol 17 g packet Commonly known as: MIRALAX / GLYCOLAX Place 17 g into feeding tube 2 (two) times daily.   Resource ThickenUp Clear Powd Take 120 g by mouth as needed (nectar thick liquids).   senna-docusate 8.6-50 MG tablet Commonly known  as: Senokot-S Place 1 tablet into feeding tube at bedtime as needed for mild constipation.   senna-docusate 8.6-50 MG tablet Commonly known as: Senokot-S Place 1 tablet into feeding tube 2 (two) times daily.      Follow-up Information    Jamse Arn, MD Follow up.   Specialty: Physical Medicine and Rehabilitation Why: Office to call for appointment Contact information: [address] [place] [address] [phone]        Leonie Man, MD Follow up.   Specialty: Cardiology Why: Call for appointment Contact information: [address] [place] [address] [phone]        Consuella Lose, MD Follow up.   Specialty: Neurosurgery Why: Call for appointment Contact information: [address] [place] [address] [phone]        [facility] CT IMAGING .   Specialty:  Radiology Contact information: [address] [id number] [place] [facility] .   Specialty: Radiology Contact information: [address] [id number] [place] [phone]          Signed: Cathlyn Parsons 12/04/2019, 4:42 PM

## 2019-12-14 NOTE — Progress Notes (Signed)
Patient noted to have persistent respiration rate near 30 overnight. Noted 29 RR and mildly labored breathing at top of shift while patient asleep. Pulmonologist and Rehab MD notified and both assessed patient. Chest x-ray obtained. Pulmonologist stated he will reach out to family. Decision made to transfer patient to [facility] for further care. Report given to Sarah. Patient and belongings transported to [facility] with nursing staff.

## 2019-12-14 NOTE — Progress Notes (Signed)
Peripherally Inserted Central Catheter Placement  The IV Nurse has discussed with the patient and/or persons authorized to consent for the patient, the purpose of this procedure and the potential benefits and risks involved with this procedure.  The benefits include less needle sticks, lab draws from the catheter, and the patient may be discharged home with the catheter. Risks include, but not limited to, infection, bleeding, blood clot (thrombus formation), and puncture of an artery; nerve damage and irregular heartbeat and possibility to perform a PICC exchange if needed/ordered by physician.  Alternatives to this procedure were also discussed.  Bard Power PICC patient education guide, fact sheet on infection prevention and patient information card has been provided to patient /or left at bedside.  Telephone consent obtained from husband due to sedation.  PICC Placement Documentation  PICC Triple Lumen 12/01/2019 PICC Left Brachial 38 cm 0 cm (Active)  Indication for Insertion or Continuance of Line Vasoactive infusions;Prolonged intravenous therapies;Limited venous access - need for IV therapy >5 days (PICC only) 11/23/2019 1554  Exposed Catheter (cm) 0 cm 12/10/2019 1554  Site Assessment Clean;Dry;Intact 11/19/2019 1554  Lumen #1 Status Flushed;Saline locked;Blood return noted 11/18/2019 1554  Lumen #2 Status Flushed;Saline locked;Blood return noted 11/21/2019 1554  Lumen #3 Status Flushed;Saline locked;Blood return noted 11/22/2019 1554  Dressing Type Transparent 12/13/2019 1554  Dressing Status Clean;Dry;Intact;Antimicrobial disc in place 12/06/2019 1554  Safety Lock Not Applicable 77/82/42 3536  Line Care Connections checked and tightened 12/10/2019 1554  Line Adjustment (NICU/IV Team Only) No 12/02/2019 1554  Dressing Intervention New dressing 12/09/2019 1443  Dressing Change Due 12/18/2019 12/05/2019 1554       Rolena Infante 11/30/2019, 3:55 PM

## 2019-12-14 NOTE — Procedures (Signed)
Intubation Procedure Note Krystal Little [id number] [birth year]  Procedure: Intubation Indications: Respiratory insufficiency  Procedure Details Consent: Risks of procedure as well as the alternatives and risks of each were explained to the (patient/caregiver).  Consent for procedure obtained. Time Out: Verified patient identification, verified procedure, site/side was marked, verified correct patient position, special equipment/implants available, medications/allergies/relevent history reviewed, required imaging and test results available.  Performed  Drugs versed 31m, fentanyl 573m, etomidate 2068mrocuronium 27m24m x 1 with MAC 3 blade Grade 1 view 7.5 Et tube passed through cords under direct visualization Placement confirmed with bilateral breath sounds, positive EtCO2 change and smoke in tube   Evaluation Hemodynamic Status: BP stable throughout; O2 sats: stable throughout Patient's Current Condition: stable Complications: No apparent complications Patient did tolerate procedure well. Chest X-ray ordered to verify placement.  CXR: pending.   [name]0/2021

## 2019-12-14 NOTE — Progress Notes (Signed)
50 mcg fentanyl wasted in pyxis. RN entered pt room and situation warranted entire 100 mcg fentanyl. Pt received full 100 mcg fentanyl, no fentanyl wasted

## 2019-12-14 NOTE — Procedures (Signed)
PCCM Video Bronchoscopy Procedure Note  The patient was informed of the risks (including but not limited to bleeding, infection, respiratory failure, lung injury, tooth/oral injury) and benefits of the procedure and gave consent, see chart.  Indication: obstruction left lung on imaging  Post Procedure Diagnosis: mucus plugging left lung  Location: [facility], 2H ICU  Condition pre procedure: stable  Medications for procedure: versed, fentanyl, etomidate, rocuronium as given per intubation, see note  Procedure description: The bronchoscope was introduced through the endotracheal tube and passed to the bilateral lungs to the level of the subsegmental bronchi throughout the tracheobronchial tree.  Airway exam revealed normal appearing carina and right tracheobronchial tree without lesion, inflammation, or secretions.  There was a large, thick, grey mucus plug completely occluding the left mainstem bronchus.  After suctioning there was moderate bronchomalacia noted of the left mainstem bronchus, but no mass or airway lesion identified.   Procedures performed: therapeutic aspiration of the left mainstem bronchus, lingula BAL  Specimens sent: BAL lingula  Condition post procedure: stable  EBL: none  Complications: none immediate  Roselie Awkward, MD [place] PCCM Pager: [phone] Cell: [phone] If no response, call [phone]

## 2019-12-14 NOTE — Progress Notes (Addendum)
[facility] PHYSICAL MEDICINE & REHABILITATION PROGRESS NOTE  Subjective/Complaints:  Pt is awake and alert , appreciate pulmonary notes  Pt tired didn't sleep well   ROS: Denies CP, SOB, N/V/D  Objective: Vital Signs: Blood pressure (!) 151/81, pulse 80, temperature 98.2 F (36.8 C), resp. rate (!) 27, height 5\' 4"  (1.626 m), weight 63.9 kg, SpO2 97 %. DG Chest 2 View  Result Date: 12/12/2019 CLINICAL DATA:  Tachypnea EXAM: CHEST - 2 VIEW COMPARISON:  11/15/2019 FINDINGS: Post sternotomy changes. Right-sided pacing device as before. Right lung is grossly clear. Large left-sided pleural effusion, increased compared to prior with small amount of aerated lung at the apex. Enlarged cardiomediastinal silhouette. No pneumothorax. IMPRESSION: 1. Large left-sided pleural effusion, increased compared to prior with minimal aerated lung at the left apex. 2. Cardiomegaly Electronically Signed   By: Donavan Foil M.D.   On: 12/12/2019 20:54   CT CHEST WO CONTRAST  Result Date: 11/21/2019 CLINICAL DATA:  Hemothorax unexplained.  On anticoagulation. EXAM: CT CHEST WITHOUT CONTRAST TECHNIQUE: Multidetector CT imaging of the chest was performed following the standard protocol without IV contrast. COMPARISON:  Radiograph yesterday, additional priors. Remote chest CT 12/20/2009 FINDINGS: Cardiovascular: Pacemaker with leads in the right atrium and ventricle. Thoracic aorta is normal in caliber. There is multi chamber cardiomegaly, with prominent left atrial dilatation. Coronary artery calcifications. Mitral annulus calcifications. No definite pericardial effusion, fluid abutting the superior heart border felt to be loculated pleural effusion. Mediastinum/Nodes: Limited assessment for adenopathy. Lack of IV contrast as well as streak artifact from right-sided pacemaker. There is a 12 mm lower paratracheal node. The esophagus is displaced posteriorly by left atrial enlargement. No visualized thyroid nodule.  Lungs/Pleura: Motion artifact limits assessment. Left mainstem bronchus is completely opacified with lobulated density just below the carina. Left lower lobe bronchus is completely opacified. Minimal air within the segmental branch of the left lower lobe, however no significant aerated lung. Left upper lobe bronchus is opacified. Minimal air within the segmental bronchus of the anterior upper lobe. Small portion of left upper lobe is aerated. Ground-glass and dependent opacities within the aerated left upper lobe. Moderate size left pleural effusion which is partially loculated. Density measurements of the pleural fluid are limited given streak artifact from pacemaker, however does not appear increased density such is hemothorax. No pneumothorax. Ground-glass opacity in the periphery of the right upper lobe, series 4, image 23. Right lung otherwise clear. There is no right pleural effusion. Upper Abdomen: Coarse calcification in the spleen, also seen on prior exam. Cirrhotic hepatic morphology. Subcapsular 13 mm low-density in the right lobe is likely cyst, but incompletely characterized. There is left adrenal thickening without dominant nodule. Musculoskeletal: Median sternotomy. No evidence of focal bone lesion or acute osseous abnormality. IMPRESSION: 1. Moderate size partially loculated left pleural effusion. 2. Significant filling of the left bronchial tree, completely filling the left mainstem bronchus, left lower lobe bronchus, and incomplete filling the left upper lobe bronchus. This may represent aspiration. Recommend bronchoscopy for further evaluation to exclude possibility of underlying obstructive mass. 3. Complete collapse of the left lower lobe. Left upper lobe demonstrates ground-glass opacity which may be compressive atelectasis or pneumonia. 4. Small focal ground-glass opacity in the periphery of the right upper lobe is nonspecific. 5. Multi chamber cardiomegaly with prominent left atrial  enlargement which causes mass effect on the adjacent esophagus. 6. Cirrhosis. Aortic Atherosclerosis (ICD10-I70.0). Electronically Signed   By: Keith Rake M.D.   On: 12/03/2019 03:25   DG  CHEST PORT 1 VIEW  Result Date: 12/13/2019 CLINICAL DATA:  Pleural effusion.  Recent thoracentesis EXAM: PORTABLE CHEST 1 VIEW COMPARISON:  Dec 12, 2019 and may 03/05/2020 FINDINGS: No pneumothorax. Left pleural effusion smaller compared to 1 day prior. There is residual pleural effusion on the left with the patchy consolidation throughout portions of the left mid and lower lung regions. Right lung clear. Heart size and pulmonary vascularity are normal. Pacemaker leads are attached to the right atrium and right ventricle. No adenopathy. No bone lesions. IMPRESSION: Left pleural effusion smaller than 1 day prior. No pneumothorax. There is a combination of residual pleural effusion as well as consolidation throughout the left mid and lower lung zone regions. Right lung clear. Stable cardiac silhouette. Postoperative changes noted. Electronically Signed   By: Lowella Grip III M.D.   On: 12/13/2019 13:36   Recent Labs    12/12/19 2216 12/13/19 1451  WBC 9.8  --   HGB 10.2*  --   HCT 32.1* 36.9  PLT 230  --    Recent Labs    12/12/19 0726  NA 138  K 3.7  CL 100  CO2 27  GLUCOSE 100*  BUN 11  CREATININE 0.58  CALCIUM 8.9    Physical Exam: BP (!) 151/81 (BP Location: Left Arm)   Pulse 80   Temp 98.2 F (36.8 C)   Resp (!) 27   Ht 5\' 4"  (1.626 m)   Wt 63.9 kg   SpO2 97%   BMI 24.18 kg/m     General: No acute distress Mood and affect are appropriate Heart: Regular rate and rhythm no rubs murmurs or extra sounds Lungs: Clear to auscultation on RIght side , breathing unlabored, no rales or wheezes Using accessory muscles  Left side with absent BS Abdomen: Positive bowel sounds, soft nontender to palpation, nondistended Extremities: No clubbing, cyanosis, or edema   Neuro:  Alert Motor: RUE: 4/5 proximal distal RLE: 4/5 proximal distal LUE: 1+/5 shoulder abduction, distally 0/5, except for some finger flicker LLE: Hip flexion, knee extension, ankle dorsiflexion 2+-3 -/5, unchanged Left facial weakness  Assessment/Plan: 1. 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  Physiatrist is providing close team supervision and 24 hour management of active medical problems listed below.  Physiatrist and rehab team continue to assess barriers to discharge/monitor patient progress toward functional and medical goals  Care Tool:  Bathing    Body parts bathed by patient: Left arm, Chest, Abdomen, Front perineal area, Right upper leg, Left upper leg, Face, Right lower leg, Left lower leg   Body parts bathed by helper: Buttocks, Right arm     Bathing assist Assist Level: Moderate Assistance - Patient 50 - 74%     Upper Body Dressing/Undressing Upper body dressing   What is the patient wearing?: Pull over shirt    Upper body assist Assist Level: Moderate Assistance - Patient 50 - 74%    Lower Body Dressing/Undressing Lower body dressing      What is the patient wearing?: Incontinence brief, Pants     Lower body assist Assist for lower body dressing: Maximal Assistance - Patient 25 - 49%     Toileting Toileting    Toileting assist Assist for toileting: Maximal Assistance - Patient 25 - 49%     Transfers Chair/bed transfer  Transfers assist  Chair/bed transfer assist level: Moderate Assistance - Patient 50 - 74%     Locomotion Ambulation   Ambulation assist   Ambulation activity did not occur: Safety/medical concerns  Assist level: 2 helpers Assistive device: Lite Gait Max distance: 31ft    Walk 10 feet activity   Assist  Walk 10 feet activity did not occur: Safety/medical concerns  Assist level: 2 helpers Assistive device: Lite Gait   Walk 50 feet activity   Assist Walk 50 feet with 2 turns activity did not occur: Safety/medical concerns         Walk 150 feet activity   Assist Walk 150 feet activity did not occur: Safety/medical concerns(lethargy)         Walk 10 feet on uneven surface  activity   Assist Walk 10 feet on uneven surfaces activity did not occur: Safety/medical concerns(lethargy)         Wheelchair     Assist Will patient use wheelchair at discharge?: Yes Type of Wheelchair: Manual    Wheelchair assist level: Moderate Assistance - Patient 50 - 74% Max wheelchair distance: 80ft    Wheelchair 50 feet with 2 turns activity    Assist        Assist Level: Moderate Assistance - Patient 50 - 74%   Wheelchair 150 feet activity     Assist     Assist Level: Dependent - Patient 0%    Medical Problem List and Plan: 1. 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    Repeat head CT personally reviewed, and reviewed with patient, showing some improvement  Helmet for safety when OOB.  WHO/PRAFO nightly   Pt with reduced exercise tolerance ventilating Right lung primarily.  Hemothorax tapped yesterday but also with obstructive mass in bronchial area LLL- needs bronchoscopy +/- Chest tube THis will limit therapy participation for several days .  As discussed with Dr Lake Bells this would be best accomplished on the acute care side of hospital will clarify if CCM vs hospitalist service. .  Off apixiban   Discussed with pulmonary Dr Lake Bells- will  transfer to ICU, attending McQuaid needs cardiac monitoring , anticipate back to rehab when stable  2.  Antithrombotics:  -DVT/anticoagulation:    Discussed with neurology, continue Eliquis             -antiplatelet therapy: Aspirin 325 mg daily initiated 11/17/2019 3. Pain Management: Tylenol as needed.  4. Mood: Amantadine 100 mg twice daily             -antipsychotic agents: N/A 5. Neuropsych: This patient is not fully capable of making decisions on her own behalf. 6. Skin/Wound Care: Routine skin checks 7. Fluids/Electrolytes/Nutrition: Routine in and outs.    BMP within acceptable range on 5/28 8. Atrial fibrillation/pacemaker.    Failed Coumadin, started Eliquis  Cardiac rate controlled.    Continue Cardizem 60 mg every 6 hours as well as Lopressor 25 mg twice daily.    Follow-up cardiology services as needed 9. Acute blood loss anemia.    Hemoglobin 11.3 on 5/24   Continue to monitor 10. Uncontrolled diabetes mellitus type 2 with hyperglycemia.  Hemoglobin A1c 9.7.    NovoLog 8 units BID, changed to 3 times daily on 5/26, increased to 10 units 3 times daily on 5/27, increased to 12 TID  on 5/28  Lantus insulin 17 twice daily, decreased to 15 BID on 5/28, will bump up to 16U   Check blood sugars before meals and at bedtime.  Very Labile on 5/28 with variable PO/supplement intake   CBG (last 3)  Recent Labs    12/13/19 1659 12/13/19 2119 12/13/2019 0616  GLUCAP 261* 286* 255*  wBS up again today  11. Post stroke  dysphagia.    Advance diet to D2 thins  Continue to advance as tolerated 12. Hyperlipidemia.  Lipitor 13. Essential HTN  See #8  Started lisinopril 2.5 daily on 5/16, increased to 5 on 5/28 14.  Sleep disturbance:   Melatonin 3mg  HS  Improving 15.  Hypokalemia  Potassium 3.7 on 5/28 16.  Tachypnea with Left pleural effusion of undetermied etiology, no fever , chills , cough to suggest infection will need to send fluid for analysis.  Have consulted  pulmonary Discussed with husband Anselmo Rod [phone] and Jeannetta Nap [phone] who can sign consent for thoracentesis, hold Eliquis due to hemothorax    LOS: 24 days A FACE TO [place] E Kalep Full 12/13/2019, 8:19 AM

## 2019-12-14 NOTE — Progress Notes (Signed)
Pharmacy Antibiotic Note  Krystal Little is a 64 y.o. female admitted on 11/22/2019 from rehab with hemothorax and need for chest tube insertion. Pharmacy has been consulted for Zosyn dosing with concerns for aspiration PNA.  Plan: Zosyn 3.375g IV EI q8h Monitor LOT, renal function, cultures  Height: 5\' 4"  (162.6 cm) Weight: 61.3 kg (135 lb 2.3 oz) IBW/kg (Calculated) : 54.7  Temp (24hrs), Avg:98.9 F (37.2 C), Min:98.2 F (36.8 C), Max:100.2 F (37.9 C)  Recent Labs  Lab 12/08/19 0620 12/12/19 0726 12/12/19 2216  WBC 6.7  --  9.8  CREATININE 0.58 0.58  --     Estimated Creatinine Clearance: 60.5 mL/min (by C-G formula based on SCr of 0.58 mg/dL).    No Known Allergies  Antimicrobials this admission: Zosyn 5/30 >>  Thank you for allowing pharmacy to be a part of this patient's care.  Arrie Senate, PharmD, BCPS Clinical Pharmacist [phone] Please check AMION for all [place] numbers 12/09/2019

## 2019-12-14 NOTE — Progress Notes (Signed)
Physical Therapy Session Note  Patient Details  Name: ANABIA WEATHERWAX MRN: [id number] Date of Birth: March 10, 1956  Today's Date: 12/04/2019 PT Missed Time: 61 Minutes Missed Time Reason: MD hold (Comment)(pending transfer to acute)  Short Term Goals: Week 3:  PT Short Term Goal 1 (Week 3): Patient to be able to complete all bed mobility with min assist PT Short Term Goal 2 (Week 3): Patient to be able to complete all bed-chair transfers with MinAx1 PT Short Term Goal 3 (Week 3): Patient to be able to gait train at least 21ft with hemiwalker and Min-ModA of 1 PT Short Term Goal 4 (Week 3): Paitent to be able to self propel WC at least 60ft wiht no more than MinA  Skilled Therapeutic Interventions/Progress Updates:    Pt pending transfer to acute care for medical management secondary to hemothorax, missed 60 minutes of skilled therapy tx today due to medical hold.   Therapy Documentation Precautions:  Precautions Precautions: Fall Precaution Comments: R hemicraniectomy- skull in R abdomen, L shoulder subluxation, L lateral lean and inattention, needs helmet for OOB, L weakness Restrictions Weight Bearing Restrictions: No    Therapy/Group: Individual Therapy  Netta Corrigan, PT, DPT, CSRS 11/24/2019, 9:10 AM

## 2019-12-14 NOTE — Progress Notes (Signed)
RN called CT scan to follow up order for CT chest. CT staff stated she is in their list and they are doing stat orders right now. They will call us when CT is ready for the patient. Charge RN made aware.

## 2019-12-15 ENCOUNTER — Inpatient Hospital Stay (HOSPITAL_COMMUNITY): Payer: Medicare Other

## 2019-12-15 ENCOUNTER — Inpatient Hospital Stay (HOSPITAL_COMMUNITY): Payer: Medicare Other | Admitting: Speech Pathology

## 2019-12-15 ENCOUNTER — Inpatient Hospital Stay (HOSPITAL_COMMUNITY): Payer: Medicare Other | Admitting: Physical Therapy

## 2019-12-15 ENCOUNTER — Inpatient Hospital Stay (HOSPITAL_COMMUNITY): Payer: Medicare Other | Admitting: Occupational Therapy

## 2019-12-15 LAB — COMPREHENSIVE METABOLIC PANEL
ALT: 42 U/L (ref 0–44)
AST: 28 U/L (ref 15–41)
Albumin: 1.4 g/dL — ABNORMAL LOW (ref 3.5–5.0)
Alkaline Phosphatase: 202 U/L — ABNORMAL HIGH (ref 38–126)
Anion gap: 14 (ref 5–15)
BUN: 35 mg/dL — ABNORMAL HIGH (ref 8–23)
CO2: 21 mmol/L — ABNORMAL LOW (ref 22–32)
Calcium: 7.9 mg/dL — ABNORMAL LOW (ref 8.9–10.3)
Chloride: 98 mmol/L (ref 98–111)
Creatinine, Ser: 1.82 mg/dL — ABNORMAL HIGH (ref 0.44–1.00)
GFR calc Af Amer: 33 mL/min — ABNORMAL LOW (ref >60)
GFR calc non Af Amer: 29 mL/min — ABNORMAL LOW (ref >60)
Glucose, Bld: 292 mg/dL — ABNORMAL HIGH (ref 70–99)
Potassium: 4.2 mmol/L (ref 3.5–5.1)
Sodium: 133 mmol/L — ABNORMAL LOW (ref 135–145)
Total Bilirubin: 1.4 mg/dL — ABNORMAL HIGH (ref 0.3–1.2)
Total Protein: 6.2 g/dL — ABNORMAL LOW (ref 6.5–8.1)

## 2019-12-15 LAB — GLUCOSE, CAPILLARY
Glucose-Capillary: 142 mg/dL — ABNORMAL HIGH (ref 70–99)
Glucose-Capillary: 167 mg/dL — ABNORMAL HIGH (ref 70–99)
Glucose-Capillary: 212 mg/dL — ABNORMAL HIGH (ref 70–99)
Glucose-Capillary: 259 mg/dL — ABNORMAL HIGH (ref 70–99)
Glucose-Capillary: 284 mg/dL — ABNORMAL HIGH (ref 70–99)
Glucose-Capillary: 302 mg/dL — ABNORMAL HIGH (ref 70–99)

## 2019-12-15 LAB — CBC WITH DIFFERENTIAL/PLATELET
Abs Immature Granulocytes: 0.25 10*3/uL — ABNORMAL HIGH (ref 0.00–0.07)
Basophils Absolute: 0 10*3/uL (ref 0.0–0.1)
Basophils Relative: 0 %
Eosinophils Absolute: 0 10*3/uL (ref 0.0–0.5)
Eosinophils Relative: 0 %
HCT: 30.8 % — ABNORMAL LOW (ref 36.0–46.0)
Hemoglobin: 10 g/dL — ABNORMAL LOW (ref 12.0–15.0)
Immature Granulocytes: 1 %
Lymphocytes Relative: 4 %
Lymphs Abs: 0.8 10*3/uL (ref 0.7–4.0)
MCH: 29.5 pg (ref 26.0–34.0)
MCHC: 32.5 g/dL (ref 30.0–36.0)
MCV: 90.9 fL (ref 80.0–100.0)
Monocytes Absolute: 1.8 10*3/uL — ABNORMAL HIGH (ref 0.1–1.0)
Monocytes Relative: 9 %
Neutro Abs: 17.6 10*3/uL — ABNORMAL HIGH (ref 1.7–7.7)
Neutrophils Relative %: 86 %
Platelets: 206 10*3/uL (ref 150–400)
RBC: 3.39 MIL/uL — ABNORMAL LOW (ref 3.87–5.11)
RDW: 15.4 % (ref 11.5–15.5)
WBC: 20.6 10*3/uL — ABNORMAL HIGH (ref 4.0–10.5)
nRBC: 0 % (ref 0.0–0.2)

## 2019-12-15 LAB — MAGNESIUM
Magnesium: 1.6 mg/dL — ABNORMAL LOW (ref 1.7–2.4)
Magnesium: 2.7 mg/dL — ABNORMAL HIGH (ref 1.7–2.4)

## 2019-12-15 LAB — PHOSPHORUS
Phosphorus: 5.3 mg/dL — ABNORMAL HIGH (ref 2.5–4.6)
Phosphorus: 3.7 mg/dL (ref 2.5–4.6)

## 2019-12-15 LAB — URINE CULTURE

## 2019-12-15 MED ORDER — SODIUM CHLORIDE (PF) 0.9 % IJ SOLN
10.0000 mg | Freq: Two times a day (BID) | INTRAMUSCULAR | Status: DC
  Administered 2019-12-15: 10 mg via INTRAPLEURAL
  Filled 2019-12-15 (×2): qty 10

## 2019-12-15 MED ORDER — INSULIN ASPART 100 UNIT/ML ~~LOC~~ SOLN
0.0000 [IU] | SUBCUTANEOUS | Status: DC
  Administered 2019-12-15: 2 [IU] via SUBCUTANEOUS
  Administered 2019-12-15 – 2019-12-16 (×2): 1 [IU] via SUBCUTANEOUS
  Administered 2019-12-16: 5 [IU] via SUBCUTANEOUS
  Administered 2019-12-16: 3 [IU] via SUBCUTANEOUS
  Administered 2019-12-16: 2 [IU] via SUBCUTANEOUS
  Administered 2019-12-16 – 2019-12-17 (×4): 3 [IU] via SUBCUTANEOUS

## 2019-12-15 MED ORDER — MAGNESIUM SULFATE 4 GM/100ML IV SOLN
4.0000 g | Freq: Once | INTRAVENOUS | Status: AC
  Administered 2019-12-15: 4 g via INTRAVENOUS
  Filled 2019-12-15: qty 100

## 2019-12-15 MED ORDER — STERILE WATER FOR INJECTION IJ SOLN
5.0000 mg | Freq: Two times a day (BID) | RESPIRATORY_TRACT | Status: DC
  Administered 2019-12-15: 5 mg via INTRAPLEURAL
  Filled 2019-12-15 (×3): qty 5

## 2019-12-15 MED ORDER — INSULIN ASPART 100 UNIT/ML ~~LOC~~ SOLN
8.0000 [IU] | Freq: Once | SUBCUTANEOUS | Status: AC
  Administered 2019-12-15: 8 [IU] via SUBCUTANEOUS

## 2019-12-15 MED ORDER — STERILE WATER FOR INJECTION IJ SOLN
5.0000 mg | Freq: Two times a day (BID) | RESPIRATORY_TRACT | Status: DC
  Administered 2019-12-16 – 2019-12-17 (×2): 5 mg via INTRAPLEURAL
  Filled 2019-12-15 (×3): qty 5

## 2019-12-15 MED ORDER — SODIUM CHLORIDE (PF) 0.9 % IJ SOLN
10.0000 mg | Freq: Two times a day (BID) | INTRAMUSCULAR | Status: DC
  Administered 2019-12-16 – 2019-12-17 (×2): 10 mg via INTRAPLEURAL
  Filled 2019-12-15 (×4): qty 10

## 2019-12-15 MED ORDER — INSULIN ASPART 100 UNIT/ML ~~LOC~~ SOLN
5.0000 [IU] | SUBCUTANEOUS | Status: DC
  Administered 2019-12-15 (×2): 5 [IU] via SUBCUTANEOUS

## 2019-12-15 MED ORDER — VITAL AF 1.2 CAL PO LIQD
1000.0000 mL | ORAL | Status: DC
  Administered 2019-12-15 – 2019-12-16 (×2): 1000 mL

## 2019-12-15 MED ORDER — INSULIN ASPART 100 UNIT/ML ~~LOC~~ SOLN
5.0000 [IU] | SUBCUTANEOUS | Status: DC
  Administered 2019-12-15 – 2019-12-18 (×16): 5 [IU] via SUBCUTANEOUS

## 2019-12-15 MED ORDER — MAGNESIUM SULFATE 4 GM/100ML IV SOLN
INTRAVENOUS | Status: AC
  Filled 2019-12-15: qty 100

## 2019-12-15 NOTE — Progress Notes (Signed)
Pt's blood pressure suddenly increased to 280K systolic, respirations in 40s, heart rate 130s, pt voided in purewick, and when questioned by RN about pain, pt moved her head from side to side indicating No. Pt denies nausea. Lungs sound clear in upper lobes and diminished in lower lobes, especially on the left side. Pt was repositioned, suctioned, and reassured. Precedix was increased to 1.2, levo gtt turned off, and pt was given PRN fentanyl and versed.

## 2019-12-15 NOTE — Plan of Care (Addendum)
Pt is responds to pain, is intubated, on precedix and levo gtt. Pt's levo is currently at 5 mic and precedix at 1.2, will be weaned as pt tolerates. Pt sounds diminished in lower bases, minimal drainage from pigtail cath, it has been flushed per MD order. Pt has tolerated tube feeds well and has had no signs of pain or discomfort. [facility] MD was notified around 0200 regarding pt's increased heart rate despite fever trending down. MD ordered stat labs and replaced magnesium.  Pt has been bladder scanned twice, only 10 ml urine output thru purewick. Abdomen is soft upon palpation. Will scan bladder later this morning. Problem: Clinical Measurements: Goal: Respiratory complications will improve Outcome: Progressing Goal: Cardiovascular complication will be avoided Outcome: Progressing   Problem: Elimination: Goal: Will not experience complications related to urinary retention Outcome: Not Progressing   Problem: Safety: Goal: Ability to remain free from injury will improve Outcome: Progressing

## 2019-12-15 NOTE — Progress Notes (Signed)
Initial Nutrition Assessment  DOCUMENTATION CODES:   Not applicable  INTERVENTION:   Tube Feeding via OG tube:  Vital AF 1.2 at 55 ml/hr Provides 99 g of protein, 1584 kcals, 1069 mL of free water Meets 100% estimated calorie and protein needs  NUTRITION DIAGNOSIS:   Inadequate oral intake related to acute illness as evidenced by NPO status.  GOAL:   Patient will meet greater than or equal to 90% of their needs  MONITOR:   Vent status, TF tolerance, Labs, Weight trends  REASON FOR ASSESSMENT:   Consult, Ventilator Enteral/tube feeding initiation and management  ASSESSMENT:   64 yo female admitted from [place] after being at [facility] in April in setting of R. MCA stroke requiring thrombectomy, developed significant brain edema requiring craniectomy. Pt developed L pleural effusion on Rehab requiring thoracentesis and ultimately required intubation due to resp failure from occlusion of L. Mainstem bronchus and large L hemothorax, bronch and chest tube placement. Pt did require Cortrak placement with TF initially but was taking po prior to readmission from Rehab. PMH DM, HTN, R. MCA stroke, SSS   Patient is currently intubated on ventilator support, sedated with precedex, on levophed MV: 10.2 L/min Temp (24hrs), Avg:99.5 F (37.5 C), Min:97.4 F (36.3 C), Max:100.8 F (38.2 C)  Propofol: N/A  Vital High Protein at 40 ml/hr with Pro-Stat 30 mL BID via OG tube per Adult TF protocol initiated by MD on 5/30. OG tube in distal stomach per abd xray  Pt last seen by RD on CIR on 5/25; at that time, pt eating about 50% of meals, drinking at least 1 Ensure Enlive  Admit weight 61.3 kg; current wt 64.9 kg. Net +2.7 L  Labs: CBGs 202-302, Creatinine 1.82, BUN 35, sodium 133 (L), phosphorus 5.3 Meds: ss novolog, novolog q 4 hours, lantus  Diet Order:   Diet Order            Diet NPO time specified  Diet effective now              EDUCATION NEEDS:   Not appropriate for  education at this time  Skin:  Skin Assessment: Skin Integrity Issues: Skin Integrity Issues:: Other (Comment) Other: fissure to buttocks, venous stasis ulcer (leg)  Last BM:  5/31  Height:   Ht Readings from Last 1 Encounters:  11/24/2019 5\' 4"  (1.626 m)    Weight:   Wt Readings from Last 1 Encounters:  12/15/19 64.9 kg    BMI:  Body mass index is 24.56 kg/m.  Estimated Nutritional Needs:   Kcal:  1585 kcals  Protein:  85-95 g  Fluid:  >/= 1.6 L   Kerman Passey MS, RDN, LDN, CNSC RD Pager Number and RD On-Call Pager Number Located in [place]

## 2019-12-15 NOTE — Progress Notes (Signed)
[facility] PCCM  CXR shows persistent effusion on left  Will order TPA/Pulmozyme now q12 x 3 days  Explained to daughter bedside  Roselie Awkward, MD [facility] Pager: [phone] Cell: [phone] If no response, call [phone]

## 2019-12-15 NOTE — Progress Notes (Signed)
[facility] ADULT ICU REPLACEMENT PROTOCOL FOR AM LAB REPLACEMENT ONLY  Mg 1.6 replaced per [facility] replacement protocol  Betsaida [place] M 1/70/0174 9:44 AM

## 2019-12-15 NOTE — Progress Notes (Signed)
NAME:  Krystal Little, MRN:  [id number], DOB:  1955/09/06, LOS: 1 ADMISSION DATE:  11/23/2019, CONSULTATION DATE:  5/29 REFERRING MD:  Letta Pate, CHIEF COMPLAINT:  Chest congestion   Brief History   56 female with an extensive cardiac history was admitted to [facility] in April in the setting of a right MCA stroke, had a right interventional radiology guided thrombectomy, later had significant brain edema and underwent craniectomy, ultimately transferred to inpatient rehab.  Pulmonary and critical care medicine was consulted on May 29 in the setting of tachypnea and an enlarging left-sided pleural effusion. Thoracentesis was performed which showed a hemothorax.  A CT chest was then ordered showing obstruction of her left mainstem bronchus and a loculated left pleural effusion.  On May 30 she was moved to the ICU for chest tube placement and bronchoscopy.  Past Medical History  Status post mitral valve repair 2001 Status post cardiac pacemaker 2001 History of sick sinus syndrome Hypertension Diabetes mellitus type 2 Chronic atrial fibrillation Right MCA stroke 2021  Significant Hospital Events   4/16 IR thrombectomy R MCA stroke 4/25 PCCM sign off 5/6 transfer to [facility] Inpatient Rehab 5/29 PCCM consult left pleural effusion, thoracentesis 5/30 worsening tachypnea. Move to ICU for intubation to facilitate bronchoscopy and chest tube placement  Consults:  PCCM  Procedures:  5/29 Thoracentesis 5/30 ETT >  5/30 L pigtail chest tube >   Significant Diagnostic Tests:  CT head 4/18> Worsened edema within the right MCA territory with increased size. of right basal ganglia intraparenchymal hematoma. Increased amount of subarachnoid blood over the right hemisphere. 11 mm of leftward midline shift at the level of the foramen of Monro with early herniation of the right cingulate gyrus beneath the falx cerebri  CT head 4/20 >continued interval evolution of large right MCA subacute  infract with continued interval increase in edema and swelling. Petechial hemorrhage throughout the infarct has increased. Similar parenchymal hemorrhage centered within the right basal ganglia.  CT 4/22 - 1. Stable appearance of large right MCA territory infarct withdiffuse petechial hemorrhage. 2. Stable  mass effect and midline shift. 3. Stable mild prominence of the left lateral ventricle.  CT head 4/25 1. Continued interval evolution of large right MCA territory infarct with associated hemorrhagic transformation, relatively stable as compared to 11/06/2019. No evidence for interval hemorrhage or re-bleeding. Associated mass effect with up to 12 mm of right-to-left shift not significantly changed. Stable asymmetric dilatation of the left lateral ventricle. 2. Right craniectomy without adverse features.  5/29 Thoracentesis left chest >> 8K WBC, 78% WBC, protein 4.0, LDH 182  Micro Data:  RVP 4/16 neg  MRSA PCR 4/17 neg  xxx Blood 4/23 ua 4/23 Trach aspirate 4/23 ======== 5/29 Left pleural fluid culture >  5/30 BAL >   Antimicrobials:  4/24 cefepime > 4/25 4/28 zosyn > 5/4 5/30 zosyn >   Interim history/subjective:   Blood pressure labile overnight Renal function worse Oliguria overnight WBC up  Objective   Blood pressure (!) 107/51, pulse 81, temperature 99.3 F (37.4 C), temperature source Oral, resp. rate 20, height _0  (1.626 m), weight 64.9 kg, SpO2 100 %. CVP:  [9 mmHg] 9 mmHg  Vent Mode: PRVC FiO2 (%):  [40 %-100 %] 40 % Set Rate:  [18 bmp] 18 bmp Vt Set:  [430 mL] 430 mL PEEP:  [5 cmH20] 5 cmH20 Plateau Pressure:  [21 cmH20-33 cmH20] 21 cmH20   Intake/Output Summary (Last 24 hours) at 12/15/2019 0751 Last data filed  at 12/15/2019 0700 Gross per 24 hour  Intake 3330.79 ml  Output 710 ml  Net 2620.79 ml   Filed Weights   11/26/2019 1100 12/15/19 0255  Weight: 61.3 kg 64.9 kg    Examination:  General:  In bed on vent HENT: NCAT ETT in place PULM:  Diminished on left B, vent supported breathing CV: RRR, no mgr GI: BS+, soft, nontender MSK: normal bulk and tone Neuro: sedated on vent    Resolved Hospital Problem list   R MCA stroke with cerebral edema  Assessment & Plan:  Acute respiratory failure with hypoxemia due to occlusion of the left mainstem bronchus and large left hemothorax Mucus plugging seen on bronchoscopy > likely aspiration Cause of hemothorax uncertain Continue full vent support with attempt to wean to pressure support today Repeat CXR now, consider pulmozyme and tpa if effusion still present VAP prevention Continue antibiotics NPO after extubation  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 PT after extubation  Acute blood loss anemia Monitor for bleeding Transfuse PRBC for Hgb < 7 gm/dL  DM2 with hyperglycemia glargine 20 daily Add insuline aspart 5 q4h continue SSI  Hyperlipidemia Lipitor  Essential hypertension Hold lisinopril and diltiazem  Atrial fibrillation/s/p pacemaker Tele Hold dilt Holding eliquis  Sleep disturbance Melatonin after extubation   Best practice:  Diet: hold tube feeding 5/30 if extubated Pain/Anxiety/Delirium protocol (if indicated): yes, PAD protocol, precedex, prn fentanyl, prn versed RASS target -1 to -2 VAP protocol (if indicated): yes DVT prophylaxis: scd GI prophylaxis: famotidine Glucose control: SSI, glargine Mobility: bed rest Code Status: full Family Communication: I updated her husband on 5/30 Disposition: remain in ICU  Labs   CBC: Recent Labs  Lab 12/12/19 2216 12/13/19 1451 11/26/2019 1306 12/15/19 0221  WBC 9.8  --  25.2* 20.6*  NEUTROABS 7.2  --   --  17.6*  HGB 10.2*  --  11.6* 10.0*  HCT 32.1* 36.9 36.8 30.8*  MCV 92.2  --  94.4 90.9  PLT 230  --  221 846    Basic Metabolic Panel: Recent Labs  Lab 12/12/19 0726 11/22/2019 1306 11/29/2019 1719 12/15/19 0221  NA 138 133*  --  133*  K 3.7 5.4*  --  4.2  CL 100 95*  --   98  CO2 27 25  --  21*  GLUCOSE 100* 240*  --  292*  BUN 11 28*  --  35*  CREATININE 0.58 1.89*  --  1.82*  CALCIUM 8.9 8.6*  --  7.9*  MG  --  1.7 1.5* 1.6*  PHOS  --  6.7* 6.0* 5.3*   GFR: Estimated Creatinine Clearance: 26.6 mL/min (A) (by C-G formula based on SCr of 1.82 mg/dL (H)). Recent Labs  Lab 12/12/19 2216 11/28/2019 1306 12/15/19 0221  WBC 9.8 25.2* 20.6*    Liver Function Tests: Recent Labs  Lab 12/15/2019 1306 12/15/19 0221  AST 55* 28  ALT 63* 42  ALKPHOS 235* 202*  BILITOT 1.8* 1.4*  PROT 7.1 6.2*  ALBUMIN 1.8* 1.4*   No results for input(s): LIPASE, AMYLASE in the last 168 hours. No results for input(s): AMMONIA in the last 168 hours.  ABG    Component Value Date/Time   PHART 7.404 11/27/2019 1348   PCO2ART 39.9 11/30/2019 1348   PO2ART 209 (H) 11/20/2019 1348   HCO3 24.6 12/15/2019 1348   TCO2 37 (H) 11/09/2019 1711   ACIDBASEDEF 2.0 11/02/2019 1202   O2SAT 99.9 11/30/2019 1348  Coagulation Profile: No results for input(s): INR, PROTIME in the last 168 hours.  Cardiac Enzymes: No results for input(s): CKTOTAL, CKMB, CKMBINDEX, TROPONINI in the last 168 hours.  HbA1C: HbA1c, POC (controlled diabetic range)  Date/Time Value Ref Range Status  08/18/2019 10:45 AM 12.2 (A) 0.0 - 7.0 % Final  02/04/2018 04:20 PM 8.0 (A) 0.0 - 7.0 % Final   Hgb A1c MFr Bld  Date/Time Value Ref Range Status  11/01/2019 03:48 AM 9.7 (H) 4.8 - 5.6 % Final    Comment:    (NOTE) Pre diabetes:          5.7%-6.4% Diabetes:              >6.4% Glycemic control for   <7.0% adults with diabetes     CBG: Recent Labs  Lab 12/12/2019 1705 12/09/2019 1950 11/19/2019 2328 12/15/19 0452 12/15/19 0744  GLUCAP 218* 203* 202* 284* 302*     Critical care time: 35 minutes    Roselie Awkward, MD [place] [facility] PCCM Pager: [phone] Cell: [phone] If no response, call [phone]

## 2019-12-15 NOTE — Progress Notes (Signed)
[facility] Progress Note Patient Name: Krystal Little DOB: 1956/06/02 MRN: [id number]   Date of Service  12/15/2019  HPI/Events of Note  RN said the patient was AFIB rate controlled but has been going up in the low 100s. She was on Amio before but not on Amio now. She wants to know if you want to start amio again.  Discussed with RN. Low mag 1.5 at 5 PM. ? Not replaced.  On Vent.   eICU Interventions  -get K/Mag stat. Replace if low.  Fever control.  Call if HR > 120 or so. For amiodarone gtt.      Intervention Category Intermediate Interventions: Arrhythmia - evaluation and management  Elmer Sow 12/15/2019, 2:19 AM

## 2019-12-15 NOTE — Progress Notes (Signed)
Inpatient Rehabilitation Admissions Coordinator  Patient readmitted to acute hospital from CIR. I will follow her progress to assist as appropriate.  Danne Baxter, RN, MSN Rehab Admissions Coordinator [phone] 12/15/2019 2:10 PM

## 2019-12-16 ENCOUNTER — Inpatient Hospital Stay (HOSPITAL_COMMUNITY): Payer: Medicare Other

## 2019-12-16 ENCOUNTER — Inpatient Hospital Stay (HOSPITAL_COMMUNITY): Payer: Medicare Other | Admitting: Occupational Therapy

## 2019-12-16 DIAGNOSIS — N179 Acute kidney failure, unspecified: Secondary | ICD-10-CM

## 2019-12-16 LAB — CULTURE, BAL-QUANTITATIVE W GRAM STAIN: Culture: 1000 — AB

## 2019-12-16 LAB — CBC WITH DIFFERENTIAL/PLATELET
Abs Immature Granulocytes: 0.26 10*3/uL — ABNORMAL HIGH (ref 0.00–0.07)
Basophils Absolute: 0 10*3/uL (ref 0.0–0.1)
Basophils Relative: 0 %
Eosinophils Absolute: 0.1 10*3/uL (ref 0.0–0.5)
Eosinophils Relative: 0 %
HCT: 30.9 % — ABNORMAL LOW (ref 36.0–46.0)
Hemoglobin: 9.9 g/dL — ABNORMAL LOW (ref 12.0–15.0)
Immature Granulocytes: 2 %
Lymphocytes Relative: 7 %
Lymphs Abs: 1.2 10*3/uL (ref 0.7–4.0)
MCH: 28.9 pg (ref 26.0–34.0)
MCHC: 32 g/dL (ref 30.0–36.0)
MCV: 90.1 fL (ref 80.0–100.0)
Monocytes Absolute: 1.5 10*3/uL — ABNORMAL HIGH (ref 0.1–1.0)
Monocytes Relative: 8 %
Neutro Abs: 14.6 10*3/uL — ABNORMAL HIGH (ref 1.7–7.7)
Neutrophils Relative %: 83 %
Platelets: 237 10*3/uL (ref 150–400)
RBC: 3.43 MIL/uL — ABNORMAL LOW (ref 3.87–5.11)
RDW: 15.7 % — ABNORMAL HIGH (ref 11.5–15.5)
WBC: 17.7 10*3/uL — ABNORMAL HIGH (ref 4.0–10.5)
nRBC: 0 % (ref 0.0–0.2)

## 2019-12-16 LAB — COMPREHENSIVE METABOLIC PANEL
ALT: 29 U/L (ref 0–44)
AST: 24 U/L (ref 15–41)
Albumin: 1.3 g/dL — ABNORMAL LOW (ref 3.5–5.0)
Alkaline Phosphatase: 182 U/L — ABNORMAL HIGH (ref 38–126)
Anion gap: 8 (ref 5–15)
BUN: 31 mg/dL — ABNORMAL HIGH (ref 8–23)
CO2: 26 mmol/L (ref 22–32)
Calcium: 7.9 mg/dL — ABNORMAL LOW (ref 8.9–10.3)
Chloride: 104 mmol/L (ref 98–111)
Creatinine, Ser: 1.21 mg/dL — ABNORMAL HIGH (ref 0.44–1.00)
GFR calc Af Amer: 54 mL/min — ABNORMAL LOW (ref >60)
GFR calc non Af Amer: 47 mL/min — ABNORMAL LOW (ref >60)
Glucose, Bld: 152 mg/dL — ABNORMAL HIGH (ref 70–99)
Potassium: 3.4 mmol/L — ABNORMAL LOW (ref 3.5–5.1)
Sodium: 138 mmol/L (ref 135–145)
Total Bilirubin: 1 mg/dL (ref 0.3–1.2)
Total Protein: 6.3 g/dL — ABNORMAL LOW (ref 6.5–8.1)

## 2019-12-16 LAB — GLUCOSE, CAPILLARY
Glucose-Capillary: 145 mg/dL — ABNORMAL HIGH (ref 70–99)
Glucose-Capillary: 153 mg/dL — ABNORMAL HIGH (ref 70–99)
Glucose-Capillary: 206 mg/dL — ABNORMAL HIGH (ref 70–99)
Glucose-Capillary: 254 mg/dL — ABNORMAL HIGH (ref 70–99)
Glucose-Capillary: 241 mg/dL — ABNORMAL HIGH (ref 70–99)

## 2019-12-16 LAB — BODY FLUID CULTURE: Culture: NO GROWTH

## 2019-12-16 MED ORDER — ENOXAPARIN SODIUM 40 MG/0.4ML ~~LOC~~ SOLN
40.0000 mg | SUBCUTANEOUS | Status: DC
  Administered 2019-12-16 – 2019-12-19 (×4): 40 mg via SUBCUTANEOUS
  Filled 2019-12-16 (×4): qty 0.4

## 2019-12-16 MED ORDER — POTASSIUM CHLORIDE 20 MEQ/15ML (10%) PO SOLN
40.0000 meq | Freq: Once | ORAL | Status: AC
  Administered 2019-12-16: 40 meq
  Filled 2019-12-16: qty 30

## 2019-12-16 NOTE — Progress Notes (Signed)
NAME:  Krystal Little, MRN:  [id number], DOB:  [birth year], LOS: 2 ADMISSION DATE:  12/06/2019, CONSULTATION DATE:  5/29 REFERRING MD:  Letta Pate, CHIEF COMPLAINT:  Chest congestion   Brief History   61 female with an extensive cardiac history was admitted to [facility] in April in the setting of a right MCA stroke, had a right interventional radiology guided thrombectomy, later had significant brain edema and underwent craniectomy, ultimately transferred to inpatient rehab.  Pulmonary and critical care medicine was consulted on May 29 in the setting of tachypnea and an enlarging left-sided pleural effusion. Thoracentesis was performed which showed a hemothorax.  A CT chest was then ordered showing obstruction of her left mainstem bronchus and a loculated left pleural effusion.  On May 30 she was moved to the ICU for chest tube placement and bronchoscopy.  Past Medical History  Status post mitral valve repair 2001 Status post cardiac pacemaker 2001 History of sick sinus syndrome Hypertension Diabetes mellitus type 2 Chronic atrial fibrillation Right MCA stroke 2021  Significant Hospital Events   4/16 IR thrombectomy R MCA stroke 4/25 PCCM sign off 5/6 transfer to [facility] Inpatient Rehab 5/29 PCCM consult left pleural effusion, thoracentesis 5/30 worsening tachypnea. Move to ICU for intubation to facilitate bronchoscopy and chest tube placement  Consults:  PCCM  Procedures:  5/29 Thoracentesis 5/30 ETT >  5/30 L pigtail chest tube >   Significant Diagnostic Tests:  CT head 4/18> Worsened edema within the right MCA territory with increased size. of right basal ganglia intraparenchymal hematoma. Increased amount of subarachnoid blood over the right hemisphere. 11 mm of leftward midline shift at the level of the foramen of Monro with early herniation of the right cingulate gyrus beneath the falx cerebri  CT head 4/25 1. Continued interval evolution of large right MCA territory  infarct with associated hemorrhagic transformation, relatively stable as compared to 11/06/2019. No evidence for interval hemorrhage or re-bleeding. Associated mass effect with up to 12 mm of right-to-left shift not significantly changed. Stable asymmetric dilatation of the left lateral ventricle. 2. Right craniectomy without adverse features.  5/29 Thoracentesis left chest >> 8K WBC, 78% WBC, protein 4.0, LDH 182  Micro Data:  RVP 4/16 neg  MRSA PCR 4/17 neg  xxx  ======== 5/29 Left pleural fluid culture >  5/30 BAL > ng  Antimicrobials:  4/24 cefepime > 4/25 4/28 zosyn > 5/4 5/30 zosyn >   Interim history/subjective:   Afebrile On low-dose Levophed Sedated on low-dose Precedex Minimal output on chest tube Urine output picking up   Objective   Blood pressure (!) 108/55, pulse (!) 101, temperature 98.8 F (37.1 C), temperature source Axillary, resp. rate (!) 23, height _0  (1.626 m), weight 66.1 kg, SpO2 100 %. CVP:  [8 mmHg] 8 mmHg  Vent Mode: PSV;CPAP FiO2 (%):  [40 %] 40 % Set Rate:  [18 bmp] 18 bmp Vt Set:  [430 mL] 430 mL PEEP:  [5 cmH20] 5 cmH20 Pressure Support:  [15 cmH20] 15 cmH20 Plateau Pressure:  [16 cmH20-19 cmH20] 16 cmH20   Intake/Output Summary (Last 24 hours) at 12/16/2019 0916 Last data filed at 12/16/2019 0800 Gross per 24 hour  Intake 1815.06 ml  Output 1630 ml  Net 185.06 ml   Filed Weights   11/26/2019 1100 12/15/19 0255 12/16/19 0335  Weight: 61.3 kg 64.9 kg 66.1 kg    Examination:  General: Critically ill, intubated, lying in bed sedated on Precedex HENT: NCAT ETT in place PULM: Decreased  breath sounds on left, tolerating pressure support 10/5, low tidal volumes CV: RRR, no mgr GI: BS+, soft, nontender MSK: normal bulk and tone Neuro: Follows commands, can communicate by writing, left hemiplegia, right craniotomy  Chest x-ray 6/1 personally reviewed, decreased left effusion, left lower lobe?  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 PT after extubation  Acute blood loss anemia Monitor for bleeding Transfuse PRBC for Hgb < 7 gm/dL  DM2 with hyperglycemia glargine 20 daily TF coverage with insulin aspart 5 q4h continue SSI  Hyperlipidemia Lipitor  Essential hypertension Hold lisinopril and diltiazem  Atrial fibrillation/s/p pacemaker Tele Hold dilt Holding eliquis     Best practice:  Diet: TFs Pain/Anxiety/Delirium protocol (if indicated): yes, PAD protocol, precedex, prn fentanyl, prn versed RASS target 0 to -1 VAP protocol (if indicated): yes DVT prophylaxis: scd GI prophylaxis: famotidine Glucose control: SSI, glargine Mobility: bed rest Code Status: full Family Communication: [name] Disposition:  ICU    The patient is critically ill with multiple organ systems failure and requires high complexity decision making for assessment and support, frequent evaluation and titration of therapies, application of advanced monitoring technologies and extensive interpretation of multiple databases. Critical Care Time devoted to patient care services described in this note independent of APP/resident  time is 33 minutes.    Kara Mead MD. [place]. [facility]  If no response to pager , please call [phone]   12/16/2019

## 2019-12-16 NOTE — Progress Notes (Signed)
  Alteplase 10 mg followed by dornase alpha 5 mg placed intrapleural via left pigtail chest tube.  Chest tube clamped.  Discussed with bedside RN to frequent turns over the next hour and then after 1 hour, will unclamp and resume drainage.       Kennieth Rad, MSN, AGACNP-BC [facility] 12/16/2019, 10:30 AM  See Shea Evans for personal pager PCCM on call pager [phone]

## 2019-12-16 NOTE — Progress Notes (Signed)
K 3.4 replaced per protocol

## 2019-12-16 DEATH — deceased

## 2019-12-17 ENCOUNTER — Inpatient Hospital Stay (HOSPITAL_COMMUNITY): Payer: Medicare Other

## 2019-12-17 LAB — GLUCOSE, CAPILLARY
Glucose-Capillary: 169 mg/dL — ABNORMAL HIGH (ref 70–99)
Glucose-Capillary: 207 mg/dL — ABNORMAL HIGH (ref 70–99)
Glucose-Capillary: 209 mg/dL — ABNORMAL HIGH (ref 70–99)
Glucose-Capillary: 217 mg/dL — ABNORMAL HIGH (ref 70–99)
Glucose-Capillary: 228 mg/dL — ABNORMAL HIGH (ref 70–99)
Glucose-Capillary: 233 mg/dL — ABNORMAL HIGH (ref 70–99)

## 2019-12-17 LAB — CBC
HCT: 26.2 % — ABNORMAL LOW (ref 36.0–46.0)
Hemoglobin: 8.4 g/dL — ABNORMAL LOW (ref 12.0–15.0)
MCH: 29.2 pg (ref 26.0–34.0)
MCHC: 32.1 g/dL (ref 30.0–36.0)
MCV: 91 fL (ref 80.0–100.0)
Platelets: 223 10*3/uL (ref 150–400)
RBC: 2.88 MIL/uL — ABNORMAL LOW (ref 3.87–5.11)
RDW: 16.3 % — ABNORMAL HIGH (ref 11.5–15.5)
WBC: 16.5 10*3/uL — ABNORMAL HIGH (ref 4.0–10.5)
nRBC: 0 % (ref 0.0–0.2)

## 2019-12-17 LAB — CYTOLOGY - NON PAP

## 2019-12-17 LAB — BASIC METABOLIC PANEL
Anion gap: 7 (ref 5–15)
BUN: 30 mg/dL — ABNORMAL HIGH (ref 8–23)
CO2: 23 mmol/L (ref 22–32)
Calcium: 7.4 mg/dL — ABNORMAL LOW (ref 8.9–10.3)
Chloride: 107 mmol/L (ref 98–111)
Creatinine, Ser: 1.2 mg/dL — ABNORMAL HIGH (ref 0.44–1.00)
GFR calc Af Amer: 55 mL/min — ABNORMAL LOW (ref >60)
GFR calc non Af Amer: 47 mL/min — ABNORMAL LOW (ref >60)
Glucose, Bld: 224 mg/dL — ABNORMAL HIGH (ref 70–99)
Potassium: 4.1 mmol/L (ref 3.5–5.1)
Sodium: 137 mmol/L (ref 135–145)

## 2019-12-17 LAB — MAGNESIUM: Magnesium: 2.4 mg/dL (ref 1.7–2.4)

## 2019-12-17 LAB — PHOSPHORUS: Phosphorus: 2.8 mg/dL (ref 2.5–4.6)

## 2019-12-17 LAB — HEMOGLOBIN AND HEMATOCRIT, BLOOD
HCT: 25.1 % — ABNORMAL LOW (ref 36.0–46.0)
Hemoglobin: 8 g/dL — ABNORMAL LOW (ref 12.0–15.0)

## 2019-12-17 MED ORDER — DEXMEDETOMIDINE HCL IN NACL 400 MCG/100ML IV SOLN
0.0000 ug/kg/h | INTRAVENOUS | Status: DC
  Administered 2019-12-17 – 2019-12-18 (×3): 1 ug/kg/h via INTRAVENOUS
  Filled 2019-12-17 (×3): qty 100

## 2019-12-17 MED ORDER — INSULIN ASPART 100 UNIT/ML ~~LOC~~ SOLN
0.0000 [IU] | SUBCUTANEOUS | Status: DC
  Administered 2019-12-17: 7 [IU] via SUBCUTANEOUS
  Administered 2019-12-17: 4 [IU] via SUBCUTANEOUS
  Administered 2019-12-17 – 2019-12-18 (×2): 7 [IU] via SUBCUTANEOUS
  Administered 2019-12-18: 4 [IU] via SUBCUTANEOUS
  Administered 2019-12-18: 7 [IU] via SUBCUTANEOUS
  Administered 2019-12-18: 4 [IU] via SUBCUTANEOUS
  Administered 2019-12-18 – 2019-12-19 (×2): 7 [IU] via SUBCUTANEOUS
  Administered 2019-12-19 (×5): 4 [IU] via SUBCUTANEOUS
  Administered 2019-12-20 (×2): 7 [IU] via SUBCUTANEOUS
  Administered 2019-12-20: 4 [IU] via SUBCUTANEOUS
  Administered 2019-12-20: 0 [IU] via SUBCUTANEOUS
  Administered 2019-12-21: 7 [IU] via SUBCUTANEOUS
  Administered 2019-12-21 – 2019-12-22 (×2): 4 [IU] via SUBCUTANEOUS
  Administered 2019-12-22: 3 [IU] via SUBCUTANEOUS
  Administered 2019-12-22: 7 [IU] via SUBCUTANEOUS
  Administered 2019-12-23: 15 [IU] via SUBCUTANEOUS
  Administered 2019-12-23: 11 [IU] via SUBCUTANEOUS
  Administered 2019-12-23: 7 [IU] via SUBCUTANEOUS
  Administered 2019-12-23 (×2): 11 [IU] via SUBCUTANEOUS
  Administered 2019-12-23: 7 [IU] via SUBCUTANEOUS
  Administered 2019-12-24: 4 [IU] via SUBCUTANEOUS
  Administered 2019-12-24 (×3): 7 [IU] via SUBCUTANEOUS
  Administered 2019-12-24: 4 [IU] via SUBCUTANEOUS
  Administered 2019-12-24: 7 [IU] via SUBCUTANEOUS
  Administered 2019-12-25: 4 [IU] via SUBCUTANEOUS
  Administered 2019-12-25 (×2): 7 [IU] via SUBCUTANEOUS
  Administered 2019-12-25: 15 [IU] via SUBCUTANEOUS
  Administered 2019-12-25: 4 [IU] via SUBCUTANEOUS
  Administered 2019-12-25: 11 [IU] via SUBCUTANEOUS
  Administered 2019-12-26: 15 [IU] via SUBCUTANEOUS
  Administered 2019-12-26: 11 [IU] via SUBCUTANEOUS
  Administered 2019-12-26: 15 [IU] via SUBCUTANEOUS
  Administered 2019-12-26: 4 [IU] via SUBCUTANEOUS
  Administered 2019-12-26: 7 [IU] via SUBCUTANEOUS
  Administered 2019-12-26: 15 [IU] via SUBCUTANEOUS
  Administered 2019-12-27: 3 [IU] via SUBCUTANEOUS
  Administered 2019-12-27: 4 [IU] via SUBCUTANEOUS
  Administered 2019-12-27: 3 [IU] via SUBCUTANEOUS
  Administered 2019-12-27 (×2): 4 [IU] via SUBCUTANEOUS
  Administered 2019-12-27: 3 [IU] via SUBCUTANEOUS
  Administered 2019-12-27 – 2019-12-28 (×2): 4 [IU] via SUBCUTANEOUS
  Administered 2019-12-28 – 2019-12-29 (×8): 7 [IU] via SUBCUTANEOUS
  Administered 2019-12-29: 4 [IU] via SUBCUTANEOUS
  Administered 2019-12-29: 11 [IU] via SUBCUTANEOUS
  Administered 2019-12-30 (×2): 3 [IU] via SUBCUTANEOUS
  Administered 2019-12-30 (×3): 4 [IU] via SUBCUTANEOUS
  Administered 2019-12-30: 7 [IU] via SUBCUTANEOUS
  Administered 2019-12-31 (×2): 4 [IU] via SUBCUTANEOUS
  Administered 2019-12-31 (×3): 3 [IU] via SUBCUTANEOUS
  Administered 2020-01-01: 7 [IU] via SUBCUTANEOUS
  Administered 2020-01-01 (×4): 4 [IU] via SUBCUTANEOUS
  Administered 2020-01-01: 7 [IU] via SUBCUTANEOUS
  Administered 2020-01-02 (×2): 4 [IU] via SUBCUTANEOUS
  Administered 2020-01-02 (×2): 3 [IU] via SUBCUTANEOUS
  Administered 2020-01-02 (×2): 4 [IU] via SUBCUTANEOUS
  Administered 2020-01-03: 3 [IU] via SUBCUTANEOUS
  Administered 2020-01-03 (×2): 4 [IU] via SUBCUTANEOUS
  Administered 2020-01-03: 3 [IU] via SUBCUTANEOUS
  Administered 2020-01-03 – 2020-01-04 (×2): 4 [IU] via SUBCUTANEOUS
  Administered 2020-01-04 (×5): 3 [IU] via SUBCUTANEOUS
  Administered 2020-01-05 – 2020-01-06 (×6): 4 [IU] via SUBCUTANEOUS
  Administered 2020-01-06: 3 [IU] via SUBCUTANEOUS
  Administered 2020-01-06 (×2): 4 [IU] via SUBCUTANEOUS
  Administered 2020-01-07: 3 [IU] via SUBCUTANEOUS
  Administered 2020-01-07 (×2): 4 [IU] via SUBCUTANEOUS
  Administered 2020-01-07 (×3): 3 [IU] via SUBCUTANEOUS
  Administered 2020-01-08 (×2): 4 [IU] via SUBCUTANEOUS
  Administered 2020-01-08 (×2): 3 [IU] via SUBCUTANEOUS
  Administered 2020-01-09: 4 [IU] via SUBCUTANEOUS
  Administered 2020-01-09: 3 [IU] via SUBCUTANEOUS
  Administered 2020-01-09 – 2020-01-10 (×2): 4 [IU] via SUBCUTANEOUS
  Administered 2020-01-10 (×3): 3 [IU] via SUBCUTANEOUS
  Administered 2020-01-10: 4 [IU] via SUBCUTANEOUS
  Administered 2020-01-11 – 2020-01-12 (×8): 3 [IU] via SUBCUTANEOUS
  Administered 2020-01-12 – 2020-01-15 (×4): 4 [IU] via SUBCUTANEOUS
  Administered 2020-01-15 (×4): 3 [IU] via SUBCUTANEOUS
  Administered 2020-01-16: 4 [IU] via SUBCUTANEOUS
  Administered 2020-01-16 (×2): 3 [IU] via SUBCUTANEOUS
  Administered 2020-01-16: 4 [IU] via SUBCUTANEOUS
  Administered 2020-01-17: 3 [IU] via SUBCUTANEOUS
  Administered 2020-01-17 (×3): 4 [IU] via SUBCUTANEOUS
  Administered 2020-01-17: 3 [IU] via SUBCUTANEOUS
  Administered 2020-01-18: 4 [IU] via SUBCUTANEOUS
  Administered 2020-01-18 – 2020-01-20 (×9): 3 [IU] via SUBCUTANEOUS
  Administered 2020-01-20: 2 [IU] via SUBCUTANEOUS
  Administered 2020-01-21: 4 [IU] via SUBCUTANEOUS
  Administered 2020-01-21 (×2): 3 [IU] via SUBCUTANEOUS
  Administered 2020-01-21 – 2020-01-22 (×3): 4 [IU] via SUBCUTANEOUS
  Administered 2020-01-22: 7 [IU] via SUBCUTANEOUS
  Administered 2020-01-23: 4 [IU] via SUBCUTANEOUS
  Administered 2020-01-23: 3 [IU] via SUBCUTANEOUS
  Administered 2020-01-24: 4 [IU] via SUBCUTANEOUS
  Administered 2020-01-24 (×2): 3 [IU] via SUBCUTANEOUS
  Administered 2020-01-25: 4 [IU] via SUBCUTANEOUS
  Administered 2020-01-25 – 2020-01-26 (×5): 3 [IU] via SUBCUTANEOUS
  Administered 2020-01-26: 4 [IU] via SUBCUTANEOUS
  Administered 2020-01-27: 7 [IU] via SUBCUTANEOUS
  Administered 2020-01-27: 4 [IU] via SUBCUTANEOUS
  Administered 2020-01-27: 3 [IU] via SUBCUTANEOUS
  Administered 2020-01-27: 4 [IU] via SUBCUTANEOUS
  Administered 2020-01-28: 3 [IU] via SUBCUTANEOUS
  Administered 2020-01-28: 4 [IU] via SUBCUTANEOUS
  Administered 2020-01-28: 3 [IU] via SUBCUTANEOUS
  Administered 2020-01-28 – 2020-01-29 (×4): 4 [IU] via SUBCUTANEOUS
  Administered 2020-01-29 – 2020-01-30 (×2): 3 [IU] via SUBCUTANEOUS
  Administered 2020-01-30 (×2): 4 [IU] via SUBCUTANEOUS
  Administered 2020-01-31: 3 [IU] via SUBCUTANEOUS
  Administered 2020-01-31: 4 [IU] via SUBCUTANEOUS
  Administered 2020-01-31: 11 [IU] via SUBCUTANEOUS
  Administered 2020-01-31: 4 [IU] via SUBCUTANEOUS
  Administered 2020-01-31: 7 [IU] via SUBCUTANEOUS
  Administered 2020-01-31: 3 [IU] via SUBCUTANEOUS
  Administered 2020-02-01: 7 [IU] via SUBCUTANEOUS

## 2019-12-17 MED ORDER — BISACODYL 10 MG RE SUPP: 10.0000 mg | Freq: Every day | RECTAL | Status: DC | PRN

## 2019-12-17 MED ORDER — SODIUM CHLORIDE 0.9 % IV BOLUS
1000.0000 mL | Freq: Once | INTRAVENOUS | Status: AC
  Administered 2019-12-17: 1000 mL via INTRAVENOUS

## 2019-12-17 MED ORDER — LACTATED RINGERS IV BOLUS
500.0000 mL | Freq: Once | INTRAVENOUS | Status: AC
  Administered 2019-12-17: 500 mL via INTRAVENOUS

## 2019-12-17 NOTE — Progress Notes (Signed)
[facility] Progress Note Patient Name: Krystal Little DOB: [birth year] MRN: [id number]   Date of Service  12/17/2019  HPI/Events of Note  Oliguria - LVEF = 50-55%.  eICU Interventions  Plan: 1. Bolus with 0.9 NaCl 1 liter IV over 1 hour now.      Intervention Category Major Interventions: Other:  Lysle Dingwall 12/17/2019, 3:37 AM

## 2019-12-17 NOTE — Progress Notes (Signed)
Pt HR 110's-130's and decreased output.  Spoke wit Jerene Pitch NP. Ordered tylenol and 500 bolus of LR.

## 2019-12-17 NOTE — Progress Notes (Addendum)
NAME:  Krystal Little, MRN:  [id number], DOB:  Jan 13, 1956, LOS: 3 ADMISSION DATE:  11/26/2019, CONSULTATION DATE:  5/29 REFERRING MD:  Letta Pate, CHIEF COMPLAINT:  Chest congestion   Brief History   32 female with an extensive cardiac history was admitted to [facility] in April in the setting of a right MCA stroke, had a right interventional radiology guided thrombectomy, later had significant brain edema and underwent craniectomy, ultimately transferred to inpatient rehab.  Pulmonary and critical care medicine was consulted on May 29 in the setting of tachypnea and an enlarging left-sided pleural effusion. Thoracentesis was performed which showed a hemothorax.  A CT chest was then ordered showing obstruction of her left mainstem bronchus and a loculated left pleural effusion.  On May 30 she was moved to the ICU for chest tube placement and bronchoscopy.  Past Medical History  Status post mitral valve repair 2001 Status post cardiac pacemaker 2001 History of sick sinus syndrome Hypertension Diabetes mellitus type 2 Chronic atrial fibrillation Right MCA stroke 2021  Significant Hospital Events   4/16 IR thrombectomy R MCA stroke 4/25 PCCM sign off 5/6 transfer to [facility] Inpatient Rehab 5/29 PCCM consult left pleural effusion, thoracentesis 5/30 worsening tachypnea. Move to ICU for intubation to facilitate bronchoscopy and chest tube placement 5/31 Blood pressure labile overnight, renal function worse, oliguria overnight, WBC up,  TPA/ pulmonzyme x 1 for persistent left effusion 6/1 Afebrile, on low-dose Levophed, sedated on low-dose Precedex, Minimal output on chest tube- pulmozyme and tpa x 2, urine output picking up  Consults:  PCCM  Procedures:  5/29 Thoracentesis 5/30 ETT >  5/30 L pigtail chest tube >   Significant Diagnostic Tests:  CT head 4/18> Worsened edema within the right MCA territory with increased size. of right basal ganglia intraparenchymal hematoma.  Increased amount of subarachnoid blood over the right hemisphere. 11 mm of leftward midline shift at the level of the foramen of Monro with early herniation of the right cingulate gyrus beneath the falx cerebri  CT head 4/25 1. Continued interval evolution of large right MCA territory infarct with associated hemorrhagic transformation, relatively stable as compared to 11/06/2019. No evidence for interval hemorrhage or re-bleeding. Associated mass effect with up to 12 mm of right-to-left shift not significantly changed. Stable asymmetric dilatation of the left lateral ventricle. 2. Right craniectomy without adverse features.  5/29 Thoracentesis left chest >> 8K WBC, 78% WBC, protein 4.0, LDH 182  Micro Data:  RVP 4/16 neg  MRSA PCR 4/17 neg  xxx  ======== 5/29 Left pleural fluid culture > negative 5/30 BAL > ng  Antimicrobials:  4/24 cefepime > 4/25 4/28 zosyn > 5/4 5/30 zosyn >   Interim history/subjective:  Weaned x 7 hours yesterday PSV 15/5 Oliguria overnight s/p 1L NS bolus (one unmeasured output not included, has purwick) tmax 100.3 1.97L output from left pigtail  Remains on NE 2 mcg/min Per RN has periods of acute anxiety- with tachypnea, tachycardia, hypertension, precedex now at 1 mcg/kg/hr  Objective   Blood pressure (!) 197/69, pulse (!) 119, temperature 99.5 F (37.5 C), resp. rate (!) 33, height _0  (1.626 m), weight 65.4 kg, SpO2 97 %. CVP:  [9 mmHg-10 mmHg] 9 mmHg  Vent Mode: PSV;CPAP FiO2 (%):  [40 %] 40 % Set Rate:  [18 bmp] 18 bmp Vt Set:  [430 mL] 430 mL PEEP:  [5 cmH20] 5 cmH20 Pressure Support:  [14 cmH20-15 cmH20] 14 cmH20 Plateau Pressure:  [15 cmH20-18 cmH20] 16 cmH20  Intake/Output Summary (Last 24 hours) at 12/17/2019 4580 Last data filed at 12/17/2019 0700 Gross per 24 hour  Intake 2386.43 ml  Output 2750 ml  Net -363.57 ml   Filed Weights   12/15/19 0255 12/16/19 0335 12/17/19 0500  Weight: 64.9 kg 66.1 kg 65.4 kg     Examination: General:  Critically ill older female lying in bed in NAD HEENT: MM pink/moist, ETT, OGT, pupils 3/reactive, right frontal crani site soft/ flat Neuro: briefly awakes to voice and follows simple commands on right, left flacid CV: rr, no murmur PULM:  Currently on PSV, weaned to 12/5, clear anteriorly, diminished in left base, pigtail to left with bloody drainage, no airleak GI: slightly distended, soft, hypobs, purwick cath   Extremities: warm/dry, no LE edema  Skin: no rashes  Chest x-ray 6/2 reviewed -> continued improvement, decreased left effusion, possible LLL atelectasis vs opacity  Labs with ongoing hyperglycemia, improving leukocytosis, stable sCr, Hgb trend 10-> 9.9- 8.4  Resolved Hospital Problem list   R MCA stroke with cerebral edema  Assessment & Plan:  Acute respiratory failure with hypoxemia due to occlusion of the left mainstem bronchus and large left hemothorax/loculated parapneumonic effusion Mucus plugging seen on bronchoscopy > likely aspiration - continue full MV support and daily PSV trials.  Still very weak/ deconditioned prolonging vent liberation. 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 - PT after extubation  Acute blood loss anemia - Hgb drop 9.9-> 8.4 - Recheck H/H at 1600 - Transfuse PRBC for Hgb < 7 gm/dL  DM2 with hyperglycemia - uncontrolled, increase SSI to resistant - continue lantus 20 units BID and TF coverage  Hyperlipidemia - continue  Lipitor  Essential hypertension - holding lisinopril and diltiazem in the shock setting  Atrial fibrillation/s/p pacemaker - Tele monitoring, remains in SR - holding diltiazem and eliquis   Protein calorie malnutrition  - TF at goal   No BM since 5/30 - daily miralax and colace - dulcolax prn   Best practice:  Diet: TFs Pain/Anxiety/Delirium protocol (if indicated): yes, PAD protocol, precedex, prn fentanyl, prn versed RASS target 0 to -1 VAP protocol (if indicated): yes DVT prophylaxis: SCDs GI prophylaxis: famotidine Glucose control: SSI, glargine  Mobility: bed rest Code Status: full Family Communication: [name] 6/2 Disposition:  ICU  CCT: 35 mins  Kennieth Rad, MSN, AGACNP-BC [facility] 12/17/2019, 9:03 AM  See Shea Evans for personal pager PCCM on call pager [phone]

## 2019-12-17 NOTE — Progress Notes (Deleted)
Pt HR 110-130's. Spoke with [place], NP. Order for tylenol and 500 bolus of LR.

## 2019-12-17 NOTE — Progress Notes (Signed)
Alteplase 10 mg followed by dornase alpha 5 mg placed intrapleural via left pigtail chest tube.  Chest tube clamped.  Discussed with bedside RN to frequent turns over the next hour and then after 1 hour, will unclamp and resume drainage.     Georgann Housekeeper, AGACNP-BC [place]  See Amion for personal pager PCCM on call pager [phone]  12/17/2019 1:15 AM

## 2019-12-18 ENCOUNTER — Inpatient Hospital Stay (HOSPITAL_COMMUNITY): Payer: Medicare Other

## 2019-12-18 ENCOUNTER — Encounter (HOSPITAL_COMMUNITY): Payer: Self-pay | Admitting: Pulmonary Disease

## 2019-12-18 DIAGNOSIS — J942 Hemothorax: Secondary | ICD-10-CM

## 2019-12-18 LAB — CBC WITH DIFFERENTIAL/PLATELET
Abs Immature Granulocytes: 0.14 10*3/uL — ABNORMAL HIGH (ref 0.00–0.07)
Basophils Absolute: 0 10*3/uL (ref 0.0–0.1)
Basophils Relative: 0 %
Eosinophils Absolute: 0.1 10*3/uL (ref 0.0–0.5)
Eosinophils Relative: 1 %
HCT: 24.8 % — ABNORMAL LOW (ref 36.0–46.0)
Hemoglobin: 8.1 g/dL — ABNORMAL LOW (ref 12.0–15.0)
Immature Granulocytes: 1 %
Lymphocytes Relative: 8 %
Lymphs Abs: 1.3 10*3/uL (ref 0.7–4.0)
MCH: 29.1 pg (ref 26.0–34.0)
MCHC: 32.7 g/dL (ref 30.0–36.0)
MCV: 89.2 fL (ref 80.0–100.0)
Monocytes Absolute: 1.5 10*3/uL — ABNORMAL HIGH (ref 0.1–1.0)
Monocytes Relative: 9 %
Neutro Abs: 13.6 10*3/uL — ABNORMAL HIGH (ref 1.7–7.7)
Neutrophils Relative %: 81 %
Platelets: 206 10*3/uL (ref 150–400)
RBC: 2.78 MIL/uL — ABNORMAL LOW (ref 3.87–5.11)
RDW: 16.6 % — ABNORMAL HIGH (ref 11.5–15.5)
WBC: 16.6 10*3/uL — ABNORMAL HIGH (ref 4.0–10.5)
nRBC: 0 % (ref 0.0–0.2)

## 2019-12-18 LAB — CBC
HCT: 22.3 % — ABNORMAL LOW (ref 36.0–46.0)
Hemoglobin: 7.1 g/dL — ABNORMAL LOW (ref 12.0–15.0)
MCH: 29.1 pg (ref 26.0–34.0)
MCHC: 31.8 g/dL (ref 30.0–36.0)
MCV: 91.4 fL (ref 80.0–100.0)
Platelets: 230 10*3/uL (ref 150–400)
RBC: 2.44 MIL/uL — ABNORMAL LOW (ref 3.87–5.11)
RDW: 16.7 % — ABNORMAL HIGH (ref 11.5–15.5)
WBC: 14.1 10*3/uL — ABNORMAL HIGH (ref 4.0–10.5)
nRBC: 0 % (ref 0.0–0.2)

## 2019-12-18 LAB — RENAL FUNCTION PANEL
Albumin: <1 g/dL — ABNORMAL LOW (ref 3.5–5.0)
Anion gap: 9 (ref 5–15)
BUN: 46 mg/dL — ABNORMAL HIGH (ref 8–23)
CO2: 23 mmol/L (ref 22–32)
Calcium: 7.3 mg/dL — ABNORMAL LOW (ref 8.9–10.3)
Chloride: 107 mmol/L (ref 98–111)
Creatinine, Ser: 2.03 mg/dL — ABNORMAL HIGH (ref 0.44–1.00)
GFR calc Af Amer: 29 mL/min — ABNORMAL LOW (ref >60)
GFR calc non Af Amer: 25 mL/min — ABNORMAL LOW (ref >60)
Glucose, Bld: 245 mg/dL — ABNORMAL HIGH (ref 70–99)
Phosphorus: 3.7 mg/dL (ref 2.5–4.6)
Potassium: 4.6 mmol/L (ref 3.5–5.1)
Sodium: 139 mmol/L (ref 135–145)

## 2019-12-18 LAB — GLUCOSE, CAPILLARY
Glucose-Capillary: 118 mg/dL — ABNORMAL HIGH (ref 70–99)
Glucose-Capillary: 151 mg/dL — ABNORMAL HIGH (ref 70–99)
Glucose-Capillary: 161 mg/dL — ABNORMAL HIGH (ref 70–99)
Glucose-Capillary: 183 mg/dL — ABNORMAL HIGH (ref 70–99)
Glucose-Capillary: 201 mg/dL — ABNORMAL HIGH (ref 70–99)
Glucose-Capillary: 204 mg/dL — ABNORMAL HIGH (ref 70–99)
Glucose-Capillary: 208 mg/dL — ABNORMAL HIGH (ref 70–99)

## 2019-12-18 LAB — CORTISOL: Cortisol, Plasma: 31.6 ug/dL

## 2019-12-18 LAB — PROCALCITONIN: Procalcitonin: 5.99 ng/mL

## 2019-12-18 LAB — PREPARE RBC (CROSSMATCH)

## 2019-12-18 LAB — ABO/RH: ABO/RH(D): O POS

## 2019-12-18 LAB — MAGNESIUM: Magnesium: 2.5 mg/dL — ABNORMAL HIGH (ref 1.7–2.4)

## 2019-12-18 MED ORDER — SODIUM CHLORIDE 0.9% IV SOLUTION: Freq: Once | INTRAVENOUS | Status: DC

## 2019-12-18 MED ORDER — INSULIN ASPART 100 UNIT/ML ~~LOC~~ SOLN
7.0000 [IU] | SUBCUTANEOUS | Status: DC
  Administered 2019-12-18 – 2019-12-21 (×13): 7 [IU] via SUBCUTANEOUS

## 2019-12-18 MED ORDER — AMIODARONE HCL IN DEXTROSE 360-4.14 MG/200ML-% IV SOLN
30.0000 mg/h | INTRAVENOUS | Status: DC
  Administered 2019-12-18 – 2019-12-19 (×3): 30 mg/h via INTRAVENOUS
  Filled 2019-12-18 (×3): qty 200

## 2019-12-18 MED ORDER — AMIODARONE LOAD VIA INFUSION
150.0000 mg | Freq: Once | INTRAVENOUS | Status: AC
  Administered 2019-12-18: 150 mg via INTRAVENOUS
  Filled 2019-12-18: qty 83.34

## 2019-12-18 MED ORDER — INSULIN GLARGINE 100 UNIT/ML ~~LOC~~ SOLN
25.0000 [IU] | Freq: Two times a day (BID) | SUBCUTANEOUS | Status: DC
  Administered 2019-12-18 – 2019-12-21 (×6): 25 [IU] via SUBCUTANEOUS
  Filled 2019-12-18 (×8): qty 0.25

## 2019-12-18 MED ORDER — ALTEPLASE 2 MG IJ SOLR
2.0000 mg | Freq: Once | INTRAMUSCULAR | Status: AC
  Administered 2019-12-18: 2 mg

## 2019-12-18 MED ORDER — AMIODARONE HCL IN DEXTROSE 360-4.14 MG/200ML-% IV SOLN
60.0000 mg/h | INTRAVENOUS | Status: DC
  Administered 2019-12-18: 60 mg/h via INTRAVENOUS
  Filled 2019-12-18: qty 200

## 2019-12-18 MED ORDER — VITAL AF 1.2 CAL PO LIQD
1000.0000 mL | ORAL | Status: DC
  Administered 2019-12-18 – 2019-12-20 (×3): 1000 mL

## 2019-12-18 MED ORDER — PRO-STAT SUGAR FREE PO LIQD
30.0000 mL | Freq: Every day | ORAL | Status: DC
  Administered 2019-12-19 – 2019-12-20 (×2): 30 mL via ORAL
  Filled 2019-12-18 (×3): qty 30

## 2019-12-18 NOTE — Progress Notes (Signed)
NAME:  Krystal Little, MRN:  [id number], DOB:  27-Jan-1956, LOS: 4 ADMISSION DATE:  12/13/2019, CONSULTATION DATE:  5/29 REFERRING MD:  Letta Pate, CHIEF COMPLAINT:  Chest congestion   Brief History   21 female with an extensive cardiac history was admitted to [facility] in April in the setting of a right MCA stroke, had a right interventional radiology guided thrombectomy, later had significant brain edema and underwent craniectomy, ultimately transferred to inpatient rehab.  Pulmonary and critical care medicine was consulted on May 29 in the setting of tachypnea and an enlarging left-sided pleural effusion. Thoracentesis was performed which showed a hemothorax.  A CT chest was then ordered showing obstruction of her left mainstem bronchus and a loculated left pleural effusion.  On May 30 she was moved to the ICU for chest tube placement and bronchoscopy.  Past Medical History  Status post mitral valve repair 2001 Status post cardiac pacemaker 2001 History of sick sinus syndrome Hypertension Diabetes mellitus type 2 Chronic atrial fibrillation Right MCA stroke 2021  Significant Hospital Events   4/16 IR thrombectomy R MCA stroke 4/25 PCCM sign off 5/6 transfer to [facility] Inpatient Rehab 5/29 PCCM consult left pleural effusion, thoracentesis 5/30 worsening tachypnea. Move to ICU for intubation to facilitate bronchoscopy and chest tube placement 5/31 Blood pressure labile overnight, renal function worse, oliguria overnight, WBC up,  TPA/ pulmonzyme x 1 for persistent left effusion 6/1 Afebrile, on low-dose Levophed, sedated on low-dose Precedex, Minimal output on chest tube- pulmozyme and tpa x 2, urine output picking up 6/2 Weaned x 7 hours yesterday PSV 15/5, oliguria overnight s/p 1L NS bolus (one unmeasured output not included, has purwick), tmax 100.3, 1.97L output from left pigtail  remains on NE 2 mcg/min, per RN has periods of acute anxiety- with tachypnea, tachycardia,  hypertension, precedex now at 1 mcg/kg/hr  Consults:  PCCM  Procedures:  5/29 Thoracentesis 5/30 ETT >  5/30 L pigtail chest tube >   Significant Diagnostic Tests:  CT head 4/18> Worsened edema within the right MCA territory with increased size. of right basal ganglia intraparenchymal hematoma. Increased amount of subarachnoid blood over the right hemisphere. 11 mm of leftward midline shift at the level of the foramen of Monro with early herniation of the right cingulate gyrus beneath the falx cerebri  CT head 4/25 1. Continued interval evolution of large right MCA territory infarct with associated hemorrhagic transformation, relatively stable as compared to 11/06/2019. No evidence for interval hemorrhage or re-bleeding. Associated mass effect with up to 12 mm of right-to-left shift not significantly changed. Stable asymmetric dilatation of the left lateral ventricle. 2. Right craniectomy without adverse features.  5/29 Thoracentesis left chest >> 8K WBC, 78% WBC, protein 4.0, LDH 182  Micro Data:  RVP 4/16 neg  MRSA PCR 4/17 neg  xxx  ======== 5/29 Left pleural fluid culture > negative 5/29 left pleural fluid cytology >> - No malignant cells identified  - Blood and acute inflammation.  5/30 BAL > ng  Antimicrobials:  4/24 cefepime > 4/25 4/28 zosyn > 5/4 5/30 zosyn >   Interim history/subjective:  Weaned 14/5 x 4 hrs yesterday  tmax 100.7, improving leukocytosis  afib with RVR placed on amio gtt overnight  Hgb 8.4 -< 7.1 2 BMs overnight, no obvious blood Net + 4.1L, +1.7L / 24hrs Left chest tube output 450 ml/ 24hr  Objective   Blood pressure (!) 123/56, pulse 62, temperature (!) 97.2 F (36.2 C), temperature source Oral, resp. rate (!) 23,  height _0  (1.626 m), weight 67.5 kg, SpO2 100 %. CVP:  [10 mmHg] 10 mmHg  Vent Mode: CPAP;PSV FiO2 (%):  [40 %] 40 % Set Rate:  [18 bmp] 18 bmp Vt Set:  [430 mL] 430 mL PEEP:  [5 cmH20] 5 cmH20 Pressure Support:  [15  cmH20] 15 cmH20 Plateau Pressure:  [18 cmH20-20 cmH20] 20 cmH20   Intake/Output Summary (Last 24 hours) at 12/18/2019 0851 Last data filed at 12/18/2019 0700 Gross per 24 hour  Intake 2546.53 ml  Output 925 ml  Net 1621.53 ml   Filed Weights   12/16/19 0335 12/17/19 0500 12/18/19 0500  Weight: 66.1 kg 67.4 kg 67.5 kg    Examination:  General:  Critically ill older female sitting upright in bed in NAD HEENT: MM pink/moist, ETT, OGT, pupils 3/reactive, right frontal prior crani site soft Neuro: on precedex, able to wake up and follow commands on right, left remains flaccid  CV: IRIR- in the 60's, occasionally vpaced, no murmur PULM:  PSV 14/5 with TV~400, clear, diminished in left base, left pigtail chest tube with  lighter serosanguineous today  GI: soft, bs active, flap in right abd, site healed, purwick Extremities: warm except for right hand distal to aline cooler/ delayed cap refill, motor intact /dry, no LE edema  Skin: no rashes   Chest x-ray 6/3 reviewed -> decreasing left effusion    Labs- Hgb 8.4-> 8.0-> 7.1, stable plts, continued hyperglycemia, worsening sCr 1.2-> 2, PCT 5.99  Resolved Hospital Problem list   R MCA stroke with cerebral edema  Assessment & Plan:  Acute respiratory failure with hypoxemia -  due to occlusion of the left mainstem bronchus and large left hemothorax/loculated parapneumonic effusion (not empyema based on cx) Mucus plugging seen on bronchoscopy > likely aspiration - continued full MV support, PRVC - daily PSV trials- slow to progress related to critical illness, overall debility/ deconditioning - trend CXR  - continue left pigtail CT- monitor output  - VAP bundle/ PPI  - continue zosyn day 5/x - left pleural fluid cytology neg - trending PCT  - PAD protocol with precedex gtt, prn fentanyl for RASS goal 0/-1 with bowel regimen as below  Septic shock - additional component of ABLA contributing 6/3 - hopefully can wean off levophed today,  MAP goal > 65 - will d/c aline  - cortisol reassuring   AKI - sCr up today, UOP ~0.88m/kg/hr with purwick, suspect related to volume loss/ worsening anemia  - will bladder scan q shift to monitor for retention - check FENa and renal ultrasound for completeness  - strict I/Os - renal panel in am   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 - PT after extubation  Acute blood loss anemia - Hgb drop 9.9-> 8.4->8-> 7.1 - suspected this is isolated to CT output, no other clear source of blood loss - will transfuse 1 unit of PRBC now and post transfusion H/H - CBC in am   DM2 with hyperglycemia - continue SSI resistant/ CBG q 4 - increase TF coverage and lantus to 25 units BID  - goal < 180  Hyperlipidemia - continue Lipitor  Essential hypertension - holding lisinopril and diltiazem in the shock setting  Atrial fibrillation/s/p pacemaker - tele monitoring- afib with RVR overnight now on amiodarone gtt - once off vasopressors, can change amio to cardizem  - holding eliquis due to above  Protein calorie malnutrition  - TF at goal, will get RD recommendations   No BM  since 5/30 s/p BM overnight 5/2 - continue daily miralax and colace - dulcolax prn   Best practice:  Diet: TFs Pain/Anxiety/Delirium protocol (if indicated): yes, PAD protocol, precedex, prn fentanyl, prn versed RASS target 0 to -1 VAP protocol (if indicated): yes DVT prophylaxis: SCDs GI prophylaxis: famotidine Glucose control: SSI, lantus Mobility: bed rest Code Status: full Family Communication: pending for 6/3.   Disposition:  ICU  CCT: 35 mins  Kennieth Rad, MSN, AGACNP-BC [place] [facility] 12/18/2019, 8:51 AM  See Amion for personal pager PCCM on call pager [phone]

## 2019-12-18 NOTE — Progress Notes (Signed)
[facility] Progress Note Patient Name: ELFREDA BLANCHET DOB: [birth year] MRN: [id number]   Date of Service  12/18/2019  HPI/Events of Note  AFIB with RVR - Ventricular rate = 115-130. Patient is on a low dose Norepinephrine IV infusion. K+ = 4.1 and Mg++ = 2.4.  eICU Interventions  Plan: 1. Amiodarone IV load and infusion.      Intervention Category Major Interventions: Arrhythmia - evaluation and management  Janisa Labus [place] 12/18/2019, 1:11 AM

## 2019-12-18 NOTE — Progress Notes (Signed)
Nutrition Follow-up  DOCUMENTATION CODES:   Not applicable  INTERVENTION:   Tube Feeding via OG:  Increased Vital AF 1.2 to 60 ml/hr Add Pro-Stat 30 mL daily Provides 1828 kcals, 112 g of protein and 1094 mL of free water Meets 100% estimated calorie and protein needs  NUTRITION DIAGNOSIS:   Inadequate oral intake related to acute illness as evidenced by NPO status.  Being addressed via TF   GOAL:   Patient will meet greater than or equal to 90% of their needs  progressing  MONITOR:   Vent status, TF tolerance, Labs, Weight trends  REASON FOR ASSESSMENT:   Consult, Ventilator Enteral/tube feeding initiation and management  ASSESSMENT:   64 yo female admitted from [place] after being at [facility] in April in setting of R. MCA stroke requiring thrombectomy, developed significant brain edema requiring craniectomy. Pt developed L pleural effusion on Rehab requiring thoracentesis and ultimately required intubation due to resp failure from occlusion of L. Mainstem bronchus and large L hemothorax, bronch and chest tube placement. Pt did require Cortrak placement with TF initially but was taking po prior to readmission from Rehab. PMH DM, HTN, R. MCA stroke, SSS  5/29 Thoracentesis 5/30 Intubated, Chest tube  Pt remains on vent support; alert and interactive. Attempting to wean off levophed  Tolerating Vital AF 1.2 at 55 ml/hr via OG tube  Current weight 67.5 kg; admit weight 61.3 kg. Net +4.5 L  Labs: albumin <1.0, corrected calcium 9.7 (H), CBGs 161-208 (ICU goal 140-180), Creatinine 2.03, BUN 46 Meds: colace, ss novolog, novolog q 4 hours, lantus, senokot   Diet Order:   Diet Order            Diet NPO time specified  Diet effective now              EDUCATION NEEDS:   Not appropriate for education at this time  Skin:  Skin Assessment: Skin Integrity Issues: Skin Integrity Issues:: Other (Comment) Other: fissure to buttocks, venous stasis ulcers on legs, MASD  to buttocks  Last BM:  6/1  Height:   Ht Readings from Last 1 Encounters:  11/16/2019 5\' 4"  (1.626 m)    Weight:   Wt Readings from Last 1 Encounters:  12/18/19 67.5 kg   BMI:  Body mass index is 25.54 kg/m.  Estimated Nutritional Needs:   Kcal:  1525-1830 kcals  Protein:  90-120 g  Fluid:  >/= 1.6 L   Kerman Passey MS, RDN, LDN, CNSC Registered Dietitian III RD Pager Number and RD On-Call Pager Number Located in [place]

## 2019-12-19 ENCOUNTER — Inpatient Hospital Stay (HOSPITAL_COMMUNITY): Payer: Medicare Other

## 2019-12-19 LAB — POCT I-STAT 7, (LYTES, BLD GAS, ICA,H+H)
Acid-Base Excess: 1 mmol/L (ref 0.0–2.0)
Bicarbonate: 25.9 mmol/L (ref 20.0–28.0)
Calcium, Ion: 1.15 mmol/L (ref 1.15–1.40)
HCT: 27 % — ABNORMAL LOW (ref 36.0–46.0)
Hemoglobin: 9.2 g/dL — ABNORMAL LOW (ref 12.0–15.0)
O2 Saturation: 97 %
Potassium: 3.9 mmol/L (ref 3.5–5.1)
Sodium: 143 mmol/L (ref 135–145)
TCO2: 27 mmol/L (ref 22–32)
pCO2 arterial: 43.6 mmHg (ref 32.0–48.0)
pH, Arterial: 7.382 (ref 7.350–7.450)
pO2, Arterial: 89 mmHg (ref 83.0–108.0)

## 2019-12-19 LAB — CBC
HCT: 24.7 % — ABNORMAL LOW (ref 36.0–46.0)
Hemoglobin: 8 g/dL — ABNORMAL LOW (ref 12.0–15.0)
MCH: 28.7 pg (ref 26.0–34.0)
MCHC: 32.4 g/dL (ref 30.0–36.0)
MCV: 88.5 fL (ref 80.0–100.0)
Platelets: 224 10*3/uL (ref 150–400)
RBC: 2.79 MIL/uL — ABNORMAL LOW (ref 3.87–5.11)
RDW: 17.3 % — ABNORMAL HIGH (ref 11.5–15.5)
WBC: 16.3 10*3/uL — ABNORMAL HIGH (ref 4.0–10.5)
nRBC: 0 % (ref 0.0–0.2)

## 2019-12-19 LAB — RENAL FUNCTION PANEL
Albumin: 1.2 g/dL — ABNORMAL LOW (ref 3.5–5.0)
Anion gap: 13 (ref 5–15)
BUN: 49 mg/dL — ABNORMAL HIGH (ref 8–23)
CO2: 23 mmol/L (ref 22–32)
Calcium: 7.3 mg/dL — ABNORMAL LOW (ref 8.9–10.3)
Chloride: 101 mmol/L (ref 98–111)
Creatinine, Ser: 2.45 mg/dL — ABNORMAL HIGH (ref 0.44–1.00)
GFR calc Af Amer: 23 mL/min — ABNORMAL LOW (ref >60)
GFR calc non Af Amer: 20 mL/min — ABNORMAL LOW (ref >60)
Glucose, Bld: 239 mg/dL — ABNORMAL HIGH (ref 70–99)
Phosphorus: 4.4 mg/dL (ref 2.5–4.6)
Potassium: 4 mmol/L (ref 3.5–5.1)
Sodium: 137 mmol/L (ref 135–145)

## 2019-12-19 LAB — CREATININE, URINE, RANDOM: Creatinine, Urine: 71.17 mg/dL

## 2019-12-19 LAB — GLUCOSE, CAPILLARY
Glucose-Capillary: 211 mg/dL — ABNORMAL HIGH (ref 70–99)
Glucose-Capillary: 181 mg/dL — ABNORMAL HIGH (ref 70–99)
Glucose-Capillary: 193 mg/dL — ABNORMAL HIGH (ref 70–99)
Glucose-Capillary: 161 mg/dL — ABNORMAL HIGH (ref 70–99)
Glucose-Capillary: 179 mg/dL — ABNORMAL HIGH (ref 70–99)

## 2019-12-19 LAB — PROCALCITONIN: Procalcitonin: 4.24 ng/mL

## 2019-12-19 LAB — SODIUM, URINE, RANDOM: Sodium, Ur: 21 mmol/L

## 2019-12-19 MED ORDER — METOPROLOL TARTRATE 5 MG/5ML IV SOLN
2.5000 mg | INTRAVENOUS | Status: DC | PRN
  Administered 2019-12-19: 5 mg via INTRAVENOUS
  Administered 2019-12-20: 2.5 mg via INTRAVENOUS
  Filled 2019-12-19 (×2): qty 5

## 2019-12-19 MED ORDER — METOPROLOL TARTRATE 25 MG/10 ML ORAL SUSPENSION
25.0000 mg | Freq: Two times a day (BID) | ORAL | Status: DC
  Administered 2019-12-19 – 2019-12-20 (×3): 25 mg
  Filled 2019-12-19 (×4): qty 10

## 2019-12-19 MED ORDER — DILTIAZEM 12 MG/ML ORAL SUSPENSION
60.0000 mg | Freq: Four times a day (QID) | ORAL | Status: DC
  Administered 2019-12-19 – 2019-12-20 (×7): 60 mg via ORAL
  Filled 2019-12-19 (×8): qty 6

## 2019-12-19 MED ORDER — ONDANSETRON HCL 4 MG/2ML IJ SOLN
4.0000 mg | Freq: Three times a day (TID) | INTRAMUSCULAR | Status: DC | PRN
  Administered 2019-12-19 – 2020-01-26 (×3): 4 mg via INTRAVENOUS
  Filled 2019-12-19 (×4): qty 2

## 2019-12-19 MED ORDER — ENOXAPARIN SODIUM 30 MG/0.3ML ~~LOC~~ SOLN
30.0000 mg | SUBCUTANEOUS | Status: DC
  Administered 2019-12-20 – 2019-12-21 (×2): 30 mg via SUBCUTANEOUS
  Filled 2019-12-19 (×2): qty 0.3

## 2019-12-19 MED ORDER — METOPROLOL TARTRATE 25 MG/10 ML ORAL SUSPENSION: 25.0000 mg | Freq: Two times a day (BID) | ORAL | Status: DC

## 2019-12-19 NOTE — Progress Notes (Signed)
Inpatient Rehabilitation Admissions Coordinator  Noted patient extubated. Please order PT and OT when appropriate.I will follow.  Danne Baxter, RN, MSN Rehab Admissions Coordinator [phone] 12/19/2019 2:23 PM

## 2019-12-19 NOTE — Procedures (Signed)
Extubation Procedure Note  Patient Details:   Name: Krystal Little DOB: 1955/11/20 MRN: [id number]   Airway Documentation:    Vent end date: 12/19/19 Vent end time: 1258   Evaluation  O2 sats: stable throughout Complications: No apparent complications Patient did tolerate procedure well. Bilateral Breath Sounds: Clear, Diminished   Yes  Pt extubated per physician order. Pt with cuff leak, suctioned via ETT and orally prior. Upon extubation pt placed on 4L nasal cannula with humidity. Pt with good strong cough, able to speak name and no stridor heard at this time. RT will continue to monitor.  Sharla Kidney 12/19/2019, 12:59 PM

## 2019-12-19 NOTE — Progress Notes (Signed)
NAME:  Krystal Little, MRN:  [id number], DOB:  [birth year], LOS: 5 ADMISSION DATE:  12/03/2019, CONSULTATION DATE:  5/29 REFERRING MD:  Letta Pate, CHIEF COMPLAINT:  Chest congestion   Brief History   63 female with an extensive cardiac history was admitted to [facility] in April in the setting of a right MCA stroke, had a right interventional radiology guided thrombectomy, later had significant brain edema and underwent craniectomy, ultimately transferred to inpatient rehab.  Pulmonary and critical care medicine was consulted on May 29 in the setting of tachypnea and an enlarging left-sided pleural effusion. Thoracentesis was performed which showed a hemothorax.  A CT chest was then ordered showing obstruction of her left mainstem bronchus and a loculated left pleural effusion.  On May 30 she was moved to the ICU for chest tube placement and bronchoscopy.  Past Medical History  Status post mitral valve repair 2001 Status post cardiac pacemaker 2001 History of sick sinus syndrome Hypertension Diabetes mellitus type 2 Chronic atrial fibrillation Right MCA stroke 2021  Significant Hospital Events   4/16 IR thrombectomy R MCA stroke 4/25 PCCM sign off 5/6 transfer to [facility] Inpatient Rehab 5/29 PCCM consult left pleural effusion, thoracentesis 5/30 worsening tachypnea. Move to ICU for intubation to facilitate bronchoscopy and chest tube placement 5/31 Blood pressure labile overnight, renal function worse, oliguria overnight, WBC up,  TPA/ pulmonzyme x 1 for persistent left effusion 6/1 Afebrile, on low-dose Levophed, sedated on low-dose Precedex, Minimal output on chest tube- pulmozyme and tpa x 2, urine output picking up 6/2 Weaned x 7 hours yesterday PSV 15/5, oliguria overnight s/p 1L NS bolus (one unmeasured output not included, has purwick), tmax 100.3, 1.97L output from left pigtail  remains on NE 2 mcg/min, per RN has periods of acute anxiety- with tachypnea, tachycardia,  hypertension, precedex now at 1 mcg/kg/hr 6/3 afib with RVR placed on amio gtt overnight , 1U PRBC for Hb 7.1   Consults:  PCCM  Procedures:  5/29 Thoracentesis >> 1.5 L bloody fluid 5/30 ETT >  5/30 L pigtail chest tube >  Bronchoscopy 5/30 >>  large, thick, grey mucus plug completely occluding the left mainstem bronchus.   Significant Diagnostic Tests:  CT head 4/18> Worsened edema within the right MCA territory with increased size. of right basal ganglia intraparenchymal hematoma. Increased amount of subarachnoid blood over the right hemisphere. 11 mm of leftward midline shift at the level of the foramen of Monro with early herniation of the right cingulate gyrus beneath the falx cerebri  CT head 4/25 1. Continued interval evolution of large right MCA territory infarct with associated hemorrhagic transformation, relatively stable as compared to 11/06/2019. No evidence for interval hemorrhage or re-bleeding. Associated mass effect with up to 12 mm of right-to-left shift not significantly changed. Stable asymmetric dilatation of the left lateral ventricle. 2. Right craniectomy without adverse features.  5/29 Thoracentesis left chest >> 8K WBC, 78% WBC, protein 4.0, LDH 182  Micro Data:  RVP 4/16 neg  MRSA PCR 4/17 neg  xxx  ======== 5/29 Left pleural fluid culture > negative 5/29 left pleural fluid cytology >> - No malignant cells identified  - Blood and acute inflammation.  5/30 BAL > ng  Antimicrobials:  4/24 cefepime > 4/25 4/28 zosyn > 5/4 5/30 zosyn >   Interim history/subjective:   Remains critically ill, intubated No new events overnight Remains on amiodarone IV Chest tube output has decreased to 160 last 24 hours  Objective   Blood pressure Marland Kitchen)  163/82, pulse (!) 112, temperature 98.2 F (36.8 C), temperature source Axillary, resp. rate (!) 26, height _0  (1.626 m), weight 67.2 kg, SpO2 100 %. CVP:  [13 mmHg] 13 mmHg  Vent Mode: PSV;CPAP FiO2 (%):  [40  %] 40 % Set Rate:  [18 bmp] 18 bmp Vt Set:  [430 mL] 430 mL PEEP:  [5 cmH20] 5 cmH20 Pressure Support:  [5 cmH20] 5 cmH20 Plateau Pressure:  [15 cmH20-21 cmH20] 15 cmH20   Intake/Output Summary (Last 24 hours) at 12/19/2019 1138 Last data filed at 12/19/2019 0700 Gross per 24 hour  Intake 2131.12 ml  Output 740 ml  Net 1391.12 ml   Filed Weights   12/17/19 0500 12/18/19 0500 12/19/19 0700  Weight: 67.4 kg 67.5 kg 67.2 kg    Examination:  General:  Critically ill older female sitting upright in bed in NAD HEENT: MM pink/moist, ETT, OGT, pupils 3/reactive, right frontal prior crani site soft Neuro: RASS +1 able to wake up and follow commands on right, left remains flaccid  CV: IRIR- in the 60's, occasionally vpaced, no murmur PULM:   diminished in left base, left pigtail chest tube with minimal serosanguineous fluid, no air leak GI: soft, bs active, flap in right abd, site healed, purwick Extremities: warm except for right hand distal to aline cooler/ delayed cap refill, motor intact /dry, no LE edema  Skin: no rashes   Chest x-ray 6/3 personally reviewed shows mild improvement in aeration left lower lobe   Labs show improved hemoglobin from 7.1-8 post 1 unit PRBC, increasing creatinine to 2.4, persistent leukocytosis  Resolved Hospital Problem list    Septic shock  Assessment & Plan:  Acute respiratory failure with hypoxemia -  due to occlusion of the left mainstem bronchus and large left  hemothorax/loculated parapneumonic effusion (not empyema based on cx) Mucus plugging seen on bronchoscopy > likely aspiration -Tolerating pressure support 5/5 today, proceed with extubation - continue left pigtail CT-can discontinue if output remains low - VAP bundle/ PPI  - continue zosyn day 5/x , can change to oral Augmentin to complete 14 days of antibiotics   AKI -No evidence of obstruction on renal US - check FENa , avoid nephrotoxins - strict I/Os - renal panel in am   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 - PT after extubation  Acute blood loss anemia - Hgb drop 9.9-> 7.1>> 1 u PRBC >> 8.0 - suspected this is isolated to CT output, no other clear source of blood loss -  DM2 with hyperglycemia - continue SSI resistant/ CBG q 4 -ct TF coverage and lantus to 25 units BID  - goal < 180   Chronic atrial fibrillation/s/p pacemaker - tele monitoring -Start Cardizem 60 every 6 DC amiodarone - holding eliquis due to above  Protein calorie malnutrition  - TF at goal -Place core track today since anticipate longer time for swallow to recover  No BM since 5/30 s/p BM overnight 5/2 - continue daily miralax and colace - dulcolax prn   Best practice:  Diet: TFs Pain/Anxiety/Delirium protocol (if indicated): yes, PAD protocol, precedex, prn fentanyl, prn versed RASS target 0 to -1 VAP protocol (if indicated): yes DVT prophylaxis: SCDs GI prophylaxis: famotidine Glucose control: SSI, lantus Mobility: bed rest Code Status: full Family Communication: Husband at bedside Disposition:  ICU   The patient is critically ill with multiple organ systems failure and requires high complexity decision making for assessment and support, frequent evaluation and titration of therapies,  application of advanced monitoring technologies and extensive interpretation of multiple databases. Critical Care Time devoted to patient care services described in this note independent of APP/resident  time is 35 minutes.   Kara Mead MD. [facility] Flood. [facility]  If no response to pager , please call [phone]   12/19/2019

## 2019-12-19 NOTE — Progress Notes (Signed)
Pharmacy Antibiotic Note  Krystal Little is a 64 y.o. female admitted on 12/05/2019 from rehab with hemothorax and need for chest tube insertion. Pharmacy has been consulted for Zosyn dosing with concerns for aspiration PNA.  Plan: Zosyn 3.375g IV EI q8h Monitor LOT, renal function, cultures Convert to po abx when able?  Height: _0  (162.6 cm) Weight: 67.2 kg (148 lb 2.4 oz) IBW/kg (Calculated) : 54.7  Temp (24hrs), Avg:98.4 F (36.9 C), Min:96.7 F (35.9 C), Max:99.5 F (37.5 C)  Recent Labs  Lab 12/15/19 0221 12/15/19 0221 12/16/19 0323 12/17/19 0458 12/18/19 0449 12/18/19 1943 12/19/19 0449  WBC 20.6*   < > 17.7* 16.5* 14.1* 16.6* 16.3*  CREATININE 1.82*  --  1.21* 1.20* 2.03*  --  2.45*   < > = values in this interval not displayed.    Estimated Creatinine Clearance: 21.6 mL/min (A) (by C-G formula based on SCr of 2.45 mg/dL (H)).    No Known Allergies  Antimicrobials this admission:  Cefepime 4/24 > 4/25 Zosyn 4/28 > 5/4; 5/30 >   Dose adjustments this admission:   Microbiology results:  5/30 BAL norm flora  Thank you for allowing pharmacy to be a part of this patients care.   [place] Crane, Vena Austria, BCPS, BCCP Clinical Pharmacist  12/19/2019 1:21 PM   [facility] pharmacy phone numbers are listed on [email or web address]

## 2019-12-19 NOTE — Progress Notes (Signed)
[facility] Progress Note Patient Name: Krystal Little DOB: [birth year] MRN: [id number]   Date of Service  12/19/2019  HPI/Events of Note  Notified of that patient is tachypneic. Extubated to BiPap earlier. Bedside RN requesting for anxiolytic and ABG  eICU Interventions  Will get ABG first and assess if gas exchange impairment is the reason for tachypnea     Intervention Category Intermediate Interventions: Respiratory distress - evaluation and management  Judd Lien 12/19/2019, 8:15 PM

## 2019-12-20 ENCOUNTER — Inpatient Hospital Stay (HOSPITAL_COMMUNITY): Payer: Medicare Other

## 2019-12-20 DIAGNOSIS — R5381 Other malaise: Secondary | ICD-10-CM

## 2019-12-20 DIAGNOSIS — I4821 Permanent atrial fibrillation: Secondary | ICD-10-CM

## 2019-12-20 LAB — BASIC METABOLIC PANEL
Anion gap: 14 (ref 5–15)
BUN: 53 mg/dL — ABNORMAL HIGH (ref 8–23)
CO2: 23 mmol/L (ref 22–32)
Calcium: 8 mg/dL — ABNORMAL LOW (ref 8.9–10.3)
Chloride: 107 mmol/L (ref 98–111)
Creatinine, Ser: 2.51 mg/dL — ABNORMAL HIGH (ref 0.44–1.00)
GFR calc Af Amer: 23 mL/min — ABNORMAL LOW (ref >60)
GFR calc non Af Amer: 19 mL/min — ABNORMAL LOW (ref >60)
Glucose, Bld: 99 mg/dL (ref 70–99)
Potassium: 4.1 mmol/L (ref 3.5–5.1)
Sodium: 144 mmol/L (ref 135–145)

## 2019-12-20 LAB — GLUCOSE, CAPILLARY
Glucose-Capillary: 100 mg/dL — ABNORMAL HIGH (ref 70–99)
Glucose-Capillary: 122 mg/dL — ABNORMAL HIGH (ref 70–99)
Glucose-Capillary: 173 mg/dL — ABNORMAL HIGH (ref 70–99)
Glucose-Capillary: 208 mg/dL — ABNORMAL HIGH (ref 70–99)
Glucose-Capillary: 226 mg/dL — ABNORMAL HIGH (ref 70–99)
Glucose-Capillary: 99 mg/dL (ref 70–99)

## 2019-12-20 LAB — CBC
HCT: 24.8 % — ABNORMAL LOW (ref 36.0–46.0)
Hemoglobin: 8.1 g/dL — ABNORMAL LOW (ref 12.0–15.0)
MCH: 29.3 pg (ref 26.0–34.0)
MCHC: 32.7 g/dL (ref 30.0–36.0)
MCV: 89.9 fL (ref 80.0–100.0)
Platelets: 258 10*3/uL (ref 150–400)
RBC: 2.76 MIL/uL — ABNORMAL LOW (ref 3.87–5.11)
RDW: 17.4 % — ABNORMAL HIGH (ref 11.5–15.5)
WBC: 19.8 10*3/uL — ABNORMAL HIGH (ref 4.0–10.5)
nRBC: 0 % (ref 0.0–0.2)

## 2019-12-20 LAB — PROCALCITONIN: Procalcitonin: 4.84 ng/mL

## 2019-12-20 MED ORDER — STERILE WATER FOR INJECTION IJ SOLN
5.0000 mg | Freq: Two times a day (BID) | RESPIRATORY_TRACT | Status: DC
  Administered 2019-12-20: 5 mg via INTRAPLEURAL
  Filled 2019-12-20 (×5): qty 5

## 2019-12-20 MED ORDER — AMOXICILLIN-POT CLAVULANATE 875-125 MG PO TABS: 1.0000 | ORAL_TABLET | Freq: Two times a day (BID) | ORAL | Status: DC

## 2019-12-20 MED ORDER — FREE WATER
200.0000 mL | Status: DC
  Administered 2019-12-20 – 2019-12-26 (×27): 200 mL

## 2019-12-20 MED ORDER — AMOXICILLIN-POT CLAVULANATE 500-125 MG PO TABS
1.0000 | ORAL_TABLET | Freq: Two times a day (BID) | ORAL | Status: DC
  Administered 2019-12-20: 500 mg via ORAL
  Filled 2019-12-20: qty 1

## 2019-12-20 MED ORDER — ORAL CARE MOUTH RINSE
15.0000 mL | Freq: Two times a day (BID) | OROMUCOSAL | Status: DC
  Administered 2019-12-20 – 2019-12-21 (×3): 15 mL via OROMUCOSAL

## 2019-12-20 MED ORDER — SODIUM CHLORIDE (PF) 0.9 % IJ SOLN
10.0000 mg | Freq: Two times a day (BID) | INTRAMUSCULAR | Status: DC
  Administered 2019-12-20: 10 mg via INTRAPLEURAL
  Filled 2019-12-20 (×5): qty 10

## 2019-12-20 NOTE — Progress Notes (Signed)
NAME:  Krystal Little, MRN:  [id number], DOB:  08/08/1955, LOS: 6 ADMISSION DATE:  11/19/2019, CONSULTATION DATE:  5/29 REFERRING MD:  Letta Pate, CHIEF COMPLAINT:  Chest congestion   Brief History   64 female with an extensive cardiac history was admitted to [facility] in April in the setting of a right MCA stroke, had a right interventional radiology guided thrombectomy, later had significant brain edema and underwent craniectomy, ultimately transferred to inpatient rehab.  Pulmonary and critical care medicine was consulted on May 29 in the setting of tachypnea and an enlarging left-sided pleural effusion. Thoracentesis was performed which showed a hemothorax.  A CT chest was then ordered showing obstruction of her left mainstem bronchus and a loculated left pleural effusion.  On May 30 she was moved to the ICU for chest tube placement and bronchoscopy.   Past Medical History  Status post mitral valve repair 2001 Status post cardiac pacemaker 2001 History of sick sinus syndrome Hypertension Diabetes mellitus type 2 Chronic atrial fibrillation Right MCA stroke 2021  Significant Hospital Events   4/16 IR thrombectomy R MCA stroke 4/25 PCCM sign off 5/6 transfer to [facility] Inpatient Rehab 5/29 PCCM consult left pleural effusion, thoracentesis 5/30 worsening tachypnea. Move to ICU for intubation to facilitate bronchoscopy and chest tube placement 5/31 Blood pressure labile overnight, renal function worse, oliguria overnight, WBC up,  TPA/ pulmonzyme x 1 for persistent left effusion 6/1 Afebrile, on low-dose Levophed, sedated on low-dose Precedex, Minimal output on chest tube- pulmozyme and tpa x 2, urine output picking up 6/2 Weaned x 7 hours yesterday PSV 15/5, oliguria overnight s/p 1L NS bolus (one unmeasured output not included, has purwick), tmax 100.3, 1.97L output from left pigtail  remains on NE 2 mcg/min, per RN has periods of acute anxiety- with tachypnea, tachycardia,  hypertension, precedex now at 1 mcg/kg/hr 6/3 afib with RVR placed on amio gtt overnight , 1U PRBC for Hb 7.1  6/5 extubated successfully, plan to repeat TPA/Pulmonzyme again   Consults:  PCCM  Procedures:  5/29 Thoracentesis >> 1.5 L bloody fluid 5/30 ETT >  5/30 L pigtail chest tube >  Bronchoscopy 5/30 >>  large, thick, grey mucus plug completely occluding the left mainstem bronchus.   Significant Diagnostic Tests:  CT head 4/18>  Worsened edema within the right MCA territory with increased size. of right basal ganglia intraparenchymal hematoma. Increased amount of subarachnoid blood over the right hemisphere. 11 mm of leftward midline shift at the level of the foramen of Monro with early herniation of the right cingulate gyrus beneath the falx cerebri  CT head 4/25 > 1. Continued interval evolution of large right MCA territory infarct with associated hemorrhagic transformation, relatively stable as compared to 11/06/2019. No evidence for interval hemorrhage or re-bleeding. Associated mass effect with up to 12 mm of right-to-left shift not significantly changed. Stable asymmetric dilatation of the left lateral ventricle. 2. Right craniectomy without adverse features.  5/29 Thoracentesis left chest >  8K WBC, 78% WBC, protein 4.0, LDH 182  Micro Data:  RVP 4/16 neg  MRSA PCR 4/17 neg   ======== 5/29 Left pleural fluid culture > negative 5/29 left pleural fluid cytology >> - No malignant cells identified  - Blood and acute inflammation.  5/30 BAL > ng  Antimicrobials:  4/24 cefepime > 4/25 4/28 zosyn > 5/4 5/30 zosyn >   Interim history/subjective:  Sitting up in bedside recline in no acute distress, no acute events overnight   Objective  Blood pressure 127/77, pulse 88, temperature 97.8 F (36.6 C), temperature source Oral, resp. rate (!) 27, height _0  (1.626 m), weight 67.8 kg, SpO2 100 %. CVP:  [12 mmHg] 12 mmHg  Vent Mode: BIPAP FiO2 (%):  [40 %] 40 % Set  Rate:  [15 bmp] 15 bmp PEEP:  [5 cmH20] 5 cmH20 Pressure Support:  [5 cmH20] 5 cmH20   Intake/Output Summary (Last 24 hours) at 12/20/2019 1335 Last data filed at 12/20/2019 1300 Gross per 24 hour  Intake 1315.67 ml  Output 1120 ml  Net 195.67 ml   Filed Weights   12/18/19 0500 12/19/19 0700 12/20/19 0500  Weight: 67.5 kg 67.2 kg 67.8 kg    Examination: General: Chronically ill appearing elderly female sitting up in bedside recline in NAD HEENT: [place]/AT, helmet in place, MM pink/moist, PERRL,  Neuro: Alert and oriented x2, able to follow simple commands CV: s1s2 regular rate and rhythm, no murmur, rubs, or gallops,  PULM:  Clear to ascultation bilaterally, no increased work if breathing, oxygen saturations 96-100 on RA GI: soft, bowel sounds active in all 4 quadrants, non-tender, non-distended, tolerating TF, Coretrack in place  Extremities: warm/dry, no edema  Skin: no rashes or lesions  Resolved Hospital Problem list   Septic shock Constipation   Assessment & Plan:  Acute respiratory failure with hypoxemia - due to occlusion of the left mainstem bronchus and large left  hemothorax/loculated parapneumonic effusion (not empyema based on cx) Mucus plugging seen on bronchoscopy > likely aspiration Left pleural effusion  -S/P Thoracentesis with later placement of  L pigtail chest tube >  P: Extubated 6/5 Wean supplemental oxygen as able Head of bed elevated 30 degrees. Follow intermittent chest x-ray and ABG.   Ensure adequate pulmonary hygiene  Follow cultures  Routine CT care Repeat TPA and Dornase today  Continue antibiotics if passed swallow post extubation can change to PO Augmentin  Follow chest tube output   AKI -No evidence of obstruction on renal US -FENa 0.5 consistent with pre-renal  P: Follow renal function / urine output Trend Bmet Avoid nephrotoxins, ensure adequate renal perfusion  IV hydration Strict I&0  S/P R MCA stroke with residual left sided  paralysis P: Remains stable  Supportive care PT/OT/SLP evals   Acute blood loss anemia - Hgb drop 9.9-> 7.1>> 1 u PRBC >> 8.0 - suspected this is isolated to CT output, no other clear source of blood loss P: Hgb remains stable  Continue to trend CBC  DM2 with hyperglycemia P: Continue SSI CBG q4hrs SLP eval  Chronic atrial fibrillation/s/p pacemaker P: Continuous telemetry Continue to hold Eliquis Continue Cardizem   Protein calorie malnutrition  P: Continue TF  Best practice:  Diet: TFs Pain/Anxiety/Delirium protocol (if indicated): yes, PAD protocol, precedex, prn fentanyl, prn versed RASS target 0 to -1 VAP protocol (if indicated): yes DVT prophylaxis: SCDs GI prophylaxis: famotidine Glucose control: SSI, lantus Mobility: bed rest Code Status: full Family Communication: Husband at bedside Disposition:  ICU   Signature  Johnsie Cancel, NP-C [place] / Pager information can be found on [employer]  12/20/2019, 2:53 PM

## 2019-12-20 NOTE — Progress Notes (Signed)
Pt currently off BIPAP and is tolerating well at this time. Will continue to monitor.

## 2019-12-20 NOTE — Evaluation (Signed)
Physical Therapy Evaluation Patient Details Name: Krystal Little MRN: [id number] DOB: [birth year] Today's Date: 12/20/2019   History of Present Illness  Pt is 64 y.o. female initially admitted for stroke. 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 Pt s/p R hemicraniectomy with bone flap placed in abdomen on 4/18.  PMH HTN, DM2, chronic a. Fib (coumadin) s/p pace maker.  She went to rehab and on May 29 readmitted to hospital in the setting of tachypnea and an enlarging left-sided pleural effusion. Thoracentesis was performed which showed a hemothorax.  A CT chest was then ordered showing obstruction of her left mainstem bronchus and a loculated left pleural effusion. She had bronchoscopy and L chest tube placed 5/30.  She was intubated 5/30-12/19/19.  Clinical Impression  Patient presents with decreased mobility secondary to decreased cardiorespiratory reserve, decreased balance, decreased general strength with continued L hemiparesis and inattention.  She will benefit from skilled PT in the acute setting and from return to CIR level rehab at d/c.      Follow Up Recommendations CIR;Supervision/Assistance - 24 hour    Equipment Recommendations  Hospital bed;Wheelchair (measurements PT);Wheelchair cushion (measurements PT);Other (comment)    Recommendations for Other Services       Precautions / Restrictions Precautions Precautions: Fall Precaution Comments: R hemicraniectomy- skull in R abdomen, needs helmet for OOB, L weakness/inattention/shoulder sublux      Mobility  Bed Mobility Overal bed mobility: Needs Assistance Bed Mobility: Supine to Sit     Supine to sit: HOB elevated;Mod assist;+2 for safety/equipment     General bed mobility comments: HOB elevated and pt on 3: O2 with L chest tube, assist for bringing L LE to EOB and to scoot hips  Transfers Overall transfer level: Needs assistance Equipment used: 2 person hand held assist Transfers: Squat Pivot  Transfers     Squat pivot transfers: +2 physical assistance;Mod assist     General transfer comment: assist for up to recliner on R side with two step squat pivot, first to get to chair then to scoot hips and position.  Worked on reciprocal scooting to scoot back in bed.  Ambulation/Gait                Stairs            Wheelchair Mobility    Modified Rankin (Stroke Patients Only) Modified Rankin (Stroke Patients Only) Pre-Morbid Rankin Score: No symptoms Modified Rankin: Severe disability     Balance Overall balance assessment: Needs assistance Sitting-balance support: Single extremity supported Sitting balance-Leahy Scale: Poor Sitting balance - Comments: min A to close S for sitting balance with cues for midline orientation, leaning slightly L and posterior at times Postural control: Posterior lean;Left lateral lean                                   Pertinent Vitals/Pain Pain Assessment: Faces Faces Pain Scale: Hurts a little bit Pain Location: L shoulder, chest tube insertion Pain Descriptors / Indicators: Discomfort;Guarding;Sore Pain Intervention(s): Monitored during session;Limited activity within patient's tolerance    Home Living Family/patient expects to be discharged to:: Inpatient rehab Living Arrangements: Spouse/significant other;Children;Other relatives Available Help at Discharge: Family;Available 24 hours/day Type of Home: House Home Access: Stairs to enter   [employer] of Steps: 2 Home Layout: One level Home Equipment: None      Prior Function Level of Independence: Independent  Comments: ADLs, IADLs, driving, and enjoys playing basketball (favorite team [employer] basketball and likes [employer] football). Working at [employer] in Therapist, art.     Hand Dominance   Dominant Hand: Right    Extremity/Trunk Assessment   Upper Extremity Assessment Upper Extremity Assessment: LUE  deficits/detail RUE Deficits / Details: AROM WFL, strength at least 4/5 LUE Deficits / Details: weak ineffective grip, some gravity eliminated elbow flex/ext, some scapular activation for assisted shoulder elevation    Lower Extremity Assessment Lower Extremity Assessment: RLE deficits/detail;LLE deficits/detail RLE Deficits / Details: AROM, strength grossly WFL LLE Deficits / Details: AAROM some ankle DF limitations, otherwise WFL, strength hip flexion 2-/5, knee extension 3-/5, ankle DF 2-/5 LLE Sensation: decreased light touch LLE Coordination: decreased gross motor;decreased fine motor    Cervical / Trunk Assessment Cervical / Trunk Assessment: Other exceptions Cervical / Trunk Exceptions: R crani with bone flap in abdomen  Communication   Communication: No difficulties  Cognition Arousal/Alertness: Awake/alert Behavior During Therapy: Flat affect;Impulsive Overall Cognitive Status: Impaired/Different from baseline Area of Impairment: Attention;Following commands;Safety/judgement;Problem solving                   Current Attention Level: Sustained   Following Commands: Follows one step commands consistently;Follows one step commands with increased time Safety/Judgement: Decreased awareness of safety;Decreased awareness of deficits   Problem Solving: Slow processing;Difficulty sequencing;Requires verbal cues General Comments: patient whispers after recent extubation, decreased L side awareness, decreased safety awareness, trying to get up to chair prior to safety belt on and environmental set up needed cues to avoid continuing to rock forward with risk for anterior LOB      General Comments General comments (skin integrity, edema, etc.): VSS on 3L O2    Exercises     Assessment/Plan    PT Assessment Patient needs continued PT services  PT Problem List Decreased strength;Decreased mobility;Decreased safety awareness;Decreased coordination;Decreased activity  tolerance;Decreased cognition;Decreased balance;Decreased knowledge of use of DME       PT Treatment Interventions DME instruction;Therapeutic activities;Cognitive remediation;Patient/family education;Therapeutic exercise;Balance training;Gait training;Neuromuscular re-education;Functional mobility training    PT Goals (Current goals can be found in the Care Plan section)  Acute Rehab PT Goals Patient Stated Goal: return to rehab PT Goal Formulation: With patient Time For Goal Achievement: 01/03/20 Potential to Achieve Goals: Good    Frequency Min 4X/week   Barriers to discharge        Co-evaluation               AM-PAC PT "6 Clicks" Mobility  Outcome Measure Help needed turning from your back to your side while in a flat bed without using bedrails?: A Lot Help needed moving from lying on your back to sitting on the side of a flat bed without using bedrails?: A Lot Help needed moving to and from a bed to a chair (including a wheelchair)?: Total Help needed standing up from a chair using your arms (e.g., wheelchair or bedside chair)?: Total Help needed to walk in hospital room?: Total Help needed climbing 3-5 steps with a railing? : Total 6 Click Score: 8    End of Session Equipment Utilized During Treatment: Oxygen Activity Tolerance: Patient tolerated treatment well Patient left: with call bell/phone within reach;in chair;with chair alarm set Nurse Communication: Need for lift equipment PT Visit Diagnosis: Other abnormalities of gait and mobility (R26.89);Other symptoms and signs involving the nervous system (R29.898);Muscle weakness (generalized) (M62.81)    Time: 7001-7494 PT Time Calculation (min) (ACUTE ONLY): 33 min  Charges:   PT Evaluation $PT Eval Moderate Complexity: 1 Mod PT Treatments $Therapeutic Activity: 8-22 mins        Magda Kiel, [place] [facility] [phone] 12/20/2019    Reginia Naas 12/20/2019, 3:54 PM

## 2019-12-20 NOTE — Procedures (Signed)
Procedure: instillation of pleural fibrinolytics Indication: loculated pleural effusion Description: 10mg of tPA in 30cc of saline and 5mg of dornase in 30cc of sterile water were injected into pleural space using existing pleural catheter.  30cc of saline were used to flush out dead space after.  Catheter will be clamped for 1 hour and then back to suction. Complications: none immediate  Dan Taleeyah Bora MD  

## 2019-12-20 NOTE — Evaluation (Signed)
Clinical/Bedside Swallow Evaluation Patient Details  Name: Krystal Little MRN: [id number] Date of Birth: [birth year]  Today's Date: 12/20/2019 Time: SLP Start Time (ACUTE ONLY): 0846 SLP Stop Time (ACUTE ONLY): 0904 SLP Time Calculation (min) (ACUTE ONLY): 18 min  Past Medical History:  Past Medical History:  Diagnosis Date  . Chronic atrial fibrillation ([place]) 1990s   s/p DCCV then attempted ablation of complex A Flutter (@ [facility] - Dr. Deno Etienne), failed antiarrhythmics --> INR folllwed @ [facility] ->> now status post pacemaker placement with underlying A. fib.  . Diabetes mellitus type 2, uncontrolled, without complications    On Oral Medications ([place] [facility])  . Essential hypertension   . History of cardiac catheterization 2001   R&LHC - normal Coronaries, no evidence of Restictive Cardiomyopathy or Constrictive Pericarditis (also @ [facility])  . Hx of sick sinus syndrome 07/1999   Wtih symptomatic bradycardia - syncope (Tachy-Brady)  . 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 CARDIAC CATHETERIZATION  08/03/1999   [place] - normal Coronaries; no sign of constriction or restrictive cardiomypathy  . CRANIECTOMY FOR DEPRESSED SKULL FRACTURE Right 11/02/2019   Procedure: DECOMPRESSIVE HEMI -CRANIECTOMY, IMPLANTATION OF SKULL FLAP TO RIGHT ABDOMEN;  Surgeon: Krystal Lose, MD;  Location: [place];  Service: Neurosurgery;  Laterality: Right;  . IR CT HEAD LTD  10/31/2019  . IR PERCUTANEOUS ART THROMBECTOMY/INFUSION INTRACRANIAL INC DIAG ANGIO  10/31/2019      . IR  PERCUTANEOUS ART THROMBECTOMY/INFUSION INTRACRANIAL INC DIAG ANGIO  10/31/2019  . MITRAL VALVE REPAIR  07/1999   Both Ant& Post leaflet repair - quadrangular resection&caudal transposition, #32 Sequin Annuloplasty ring  . PACEMAKER GENERATOR CHANGE  11/26/2008   Medtronic Adapta L(? if it has been checked since 02/2013)  . PACEMAKER INSERTION  07/1999   for SSS in setting of Chronic Afib; Medtronic Kappa [id number], [id number] H.  Marland Kitchen RADIOLOGY WITH ANESTHESIA N/A 10/31/2019   Procedure: IR WITH ANESTHESIA;  Surgeon: Radiologist, Medication, MD;  Location: [place];  Service: Radiology;  Laterality: N/A;  . TRANSTHORACIC ECHOCARDIOGRAM  05/2018   Normal LV size and thickness.  EF estimated 50%.  Inferobasal HK and flattened septum c/w with elevated PA pressures. Mild functional mitral stenosis at rest (postoperative).  Moderate left atrial dilation and mild right atrial dilation.  Mean PA pressures estimated 52 mmHg.   HPI:  64 y.o. female presented to [facility] ED as a code stroke for R gaze deviation and L-sided weakness. CT revealed a large R MCA infarct. Pt s/p R hemicraniectomy with bone flap placed in abdomen on 4/18. ETT 4/18-4/25. PMH HTN, DM2, chronic a. Fib (coumadin) s/p pace maker. Pt admitted to [facility] 11/20/19.  Pulmonary and critical care medicine was consulted on May 29 in the setting of tachypnea and an enlarging left-sided pleural effusion. Thoracentesis was performed which showed a hemothorax.  A CT chest was then ordered showing obstruction of her left mainstem bronchus and a loculated left pleural effusion.  On May 30 she was moved to the ICU for chest tube placement and bronchoscopy. Pt was  re-intubated  from 12/08/2019-12/19/19.  Most recent MBS on 12/03/19 recommended Dysphagia 2 solids and thin liquids.     Assessment / Plan / Recommendation Clinical Impression  Pt was seen for a bedside swallow evaluation in the setting of recent extubation.  She was encountered awake/alert with NG tube in place, and her  daughter was present for this evaluation.  Patient's voice was noted to be mildly hoarse and she had moderately decreased vocal intensity.  Oral mechanism examination was remarkable for abnormal facial and lingual symmetry on the left, reduced lingual ROM bilaterally, and reduced lingual strength.  Pt consumed trials of ice chips, thin liquid (tsp/straw), puree, and a graham cracker (crumbs/small, whole piece).  She tolerated ice chips without difficulty, but she demonstrated overt s/sx of aspiration with thin liquid, puree, ground solids, and regular solids evidenced via immediate and delayed coughing.  Cough was noted to be congested and weak.  Overt s/sx of aspiration with thin liquid and puree increased following solid trials.  Pt exhibited wet vocal quality following completion of trials, and she exhibited difficulty with clearing her airway secondary to a weak cough.  Recommend continuation of NPO at this time with alternative means of nutrition.  Pt may benefit from a repeat MBS if clinical s/sx of aspiration persist.  Pt may have a few small ice chips (no more than 10 per day) following thorough oral care and given full RN supervision.  SLP will f/u to determine readiness for clinical diet initiation vs instrumental swallow study.    SLP Visit Diagnosis: Dysphagia, oropharyngeal phase (R13.12)    Aspiration Risk  Moderate aspiration risk;Severe aspiration risk    Diet Recommendation NPO;Alternative means - temporary   Medication Administration: Via alternative means    Other  Recommendations Oral Care Recommendations: Oral care QID;Oral care prior to ice chip/H20;Staff/trained caregiver to provide oral care Other Recommendations: Have oral suction available;Remove water pitcher   Follow up Recommendations Inpatient Rehab      Frequency and Duration min 2x/week  2 weeks       Prognosis Prognosis for Safe Diet Advancement: Good Barriers to Reach Goals: Cognitive deficits      Swallow  Study   General HPI: 64 y.o. female presented to [facility] ED as a code stroke for R gaze deviation and L-sided weakness. CT revealed a large R MCA infarct. Pt s/p R hemicraniectomy with bone flap placed in abdomen on 4/18. ETT 4/18-4/25. PMH HTN, DM2, chronic a. Fib (coumadin) s/p pace maker. Pt admitted to [facility] 11/20/19.  Pulmonary and critical care medicine was consulted on May 29 in the setting of tachypnea and an enlarging left-sided pleural effusion. Thoracentesis was performed which showed a hemothorax.  A CT chest was then ordered showing obstruction of her left mainstem bronchus and a loculated left pleural effusion.  On May 30 she was moved to the ICU for chest tube placement and bronchoscopy. Pt was  re-intubated from 11/24/2019-12/19/19.  Most recent MBS on 12/03/19 recommended Dysphagia 2 solids and thin liquids.   Type of Study: Bedside Swallow Evaluation Previous Swallow Assessment: See HPI Diet Prior to this Study: NPO;NG Tube Temperature Spikes Noted: No Respiratory Status: Nasal cannula History of Recent Intubation: Yes Length of Intubations (days): 5 days(most recent intubation from 5/30-6/4) Date extubated: 12/19/19 Behavior/Cognition: Alert;Cooperative Oral Cavity Assessment: Within Functional Limits Oral Care Completed by SLP: No Oral Cavity - Dentition: Adequate natural dentition Vision: Functional for self-feeding(impacted by left inattention) Self-Feeding Abilities: Needs assist Patient Positioning: Upright in bed Baseline Vocal Quality:  Hoarse;Low vocal intensity Volitional Cough: Congested;Weak;Wet Volitional Swallow: Able to elicit    Oral/Motor/Sensory Function Overall Oral Motor/Sensory Function: Moderate impairment Facial ROM: Reduced left;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 Facial Symmetry: Abnormal symmetry left;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 Facial Strength: Reduced left;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 Lingual ROM: Reduced right;Reduced left Lingual  Symmetry: Abnormal symmetry left Lingual Strength: Reduced   Ice Chips Ice chips: Within functional limits Presentation: Spoon   Thin Liquid Thin Liquid: Impaired Presentation: Spoon;Straw Pharyngeal  Phase Impairments: Suspected delayed Swallow;Throat Clearing - Delayed;Cough - Delayed    Nectar Thick Nectar Thick Liquid: Not tested   Honey Thick Honey Thick Liquid: Not tested   Puree Puree: Impaired Presentation: Spoon Pharyngeal Phase Impairments: Suspected delayed Swallow;Multiple swallows;Cough - Immediate;Cough - Delayed   Solid     Solid: Impaired Presentation: Spoon Oral Phase Impairments: Impaired mastication Oral Phase Functional Implications: Impaired mastication;Prolonged oral transit Pharyngeal Phase Impairments: Cough - Immediate     Colin Mulders M.S., CCC-SLP Acute Rehabilitation Services Office: [phone]  Elvia Collum Casandra Dallaire 12/20/2019,9:28 AM

## 2019-12-21 ENCOUNTER — Inpatient Hospital Stay (HOSPITAL_COMMUNITY): Payer: Medicare Other

## 2019-12-21 ENCOUNTER — Encounter (HOSPITAL_COMMUNITY): Admission: AD | Disposition: E | Payer: Self-pay | Source: Intra-hospital | Attending: Pulmonary Disease

## 2019-12-21 ENCOUNTER — Inpatient Hospital Stay (HOSPITAL_COMMUNITY): Payer: Medicare Other | Admitting: Critical Care Medicine

## 2019-12-21 DIAGNOSIS — J942 Hemothorax: Secondary | ICD-10-CM

## 2019-12-21 DIAGNOSIS — D62 Acute posthemorrhagic anemia: Secondary | ICD-10-CM

## 2019-12-21 DIAGNOSIS — Z9911 Dependence on respirator [ventilator] status: Secondary | ICD-10-CM

## 2019-12-21 DIAGNOSIS — R578 Other shock: Secondary | ICD-10-CM

## 2019-12-21 HISTORY — PX: VIDEO BRONCHOSCOPY: SHX5072

## 2019-12-21 HISTORY — PX: VIDEO ASSISTED THORACOSCOPY (VATS)/THOROCOTOMY: SHX6173

## 2019-12-21 LAB — HEMOGLOBIN AND HEMATOCRIT, BLOOD
HCT: 17 % — ABNORMAL LOW (ref 36.0–46.0)
HCT: 27.1 % — ABNORMAL LOW (ref 36.0–46.0)
Hemoglobin: 5.2 g/dL — CL (ref 12.0–15.0)
Hemoglobin: 9 g/dL — ABNORMAL LOW (ref 12.0–15.0)

## 2019-12-21 LAB — BLOOD GAS, VENOUS
Acid-base deficit: 13.8 mmol/L — ABNORMAL HIGH (ref 0.0–2.0)
Bicarbonate: 13.8 mmol/L — ABNORMAL LOW (ref 20.0–28.0)
Drawn by: 57620
FIO2: 100
O2 Saturation: 67.3 %
Patient temperature: 37
pCO2, Ven: 45.2 mmHg (ref 44.0–60.0)
pH, Ven: 7.113 — CL (ref 7.250–7.430)
pO2, Ven: 51.5 mmHg — ABNORMAL HIGH (ref 32.0–45.0)

## 2019-12-21 LAB — GLUCOSE, CAPILLARY
Glucose-Capillary: 103 mg/dL — ABNORMAL HIGH (ref 70–99)
Glucose-Capillary: 118 mg/dL — ABNORMAL HIGH (ref 70–99)
Glucose-Capillary: 167 mg/dL — ABNORMAL HIGH (ref 70–99)
Glucose-Capillary: 178 mg/dL — ABNORMAL HIGH (ref 70–99)
Glucose-Capillary: 231 mg/dL — ABNORMAL HIGH (ref 70–99)
Glucose-Capillary: 91 mg/dL (ref 70–99)
Glucose-Capillary: 92 mg/dL (ref 70–99)

## 2019-12-21 LAB — CBC
HCT: 14.1 % — ABNORMAL LOW (ref 36.0–46.0)
HCT: 29.5 % — ABNORMAL LOW (ref 36.0–46.0)
Hemoglobin: 4.3 g/dL — CL (ref 12.0–15.0)
Hemoglobin: 9.9 g/dL — ABNORMAL LOW (ref 12.0–15.0)
MCH: 29.7 pg (ref 26.0–34.0)
MCH: 29.6 pg (ref 26.0–34.0)
MCHC: 30.5 g/dL (ref 30.0–36.0)
MCHC: 33.6 g/dL (ref 30.0–36.0)
MCV: 97.2 fL (ref 80.0–100.0)
MCV: 88.3 fL (ref 80.0–100.0)
Platelets: 213 10*3/uL (ref 150–400)
Platelets: 83 10*3/uL — ABNORMAL LOW (ref 150–400)
RBC: 1.45 MIL/uL — ABNORMAL LOW (ref 3.87–5.11)
RBC: 3.34 MIL/uL — ABNORMAL LOW (ref 3.87–5.11)
RDW: 17.8 % — ABNORMAL HIGH (ref 11.5–15.5)
RDW: 15.7 % — ABNORMAL HIGH (ref 11.5–15.5)
WBC: 16.7 10*3/uL — ABNORMAL HIGH (ref 4.0–10.5)
WBC: 14.5 10*3/uL — ABNORMAL HIGH (ref 4.0–10.5)
nRBC: 0.7 % — ABNORMAL HIGH (ref 0.0–0.2)
nRBC: 0.3 % — ABNORMAL HIGH (ref 0.0–0.2)

## 2019-12-21 LAB — PHOSPHORUS: Phosphorus: 7.8 mg/dL — ABNORMAL HIGH (ref 2.5–4.6)

## 2019-12-21 LAB — COMPREHENSIVE METABOLIC PANEL
ALT: 18 U/L (ref 0–44)
ALT: 94 U/L — ABNORMAL HIGH (ref 0–44)
AST: 36 U/L (ref 15–41)
AST: 262 U/L — ABNORMAL HIGH (ref 15–41)
Albumin: <1 g/dL — ABNORMAL LOW (ref 3.5–5.0)
Albumin: 2 g/dL — ABNORMAL LOW (ref 3.5–5.0)
Alkaline Phosphatase: 192 U/L — ABNORMAL HIGH (ref 38–126)
Alkaline Phosphatase: 123 U/L (ref 38–126)
Anion gap: 18 — ABNORMAL HIGH (ref 5–15)
Anion gap: 16 — ABNORMAL HIGH (ref 5–15)
BUN: 58 mg/dL — ABNORMAL HIGH (ref 8–23)
BUN: 67 mg/dL — ABNORMAL HIGH (ref 8–23)
CO2: 14 mmol/L — ABNORMAL LOW (ref 22–32)
CO2: 20 mmol/L — ABNORMAL LOW (ref 22–32)
Calcium: 6.2 mg/dL — CL (ref 8.9–10.3)
Calcium: 8 mg/dL — ABNORMAL LOW (ref 8.9–10.3)
Chloride: 114 mmol/L — ABNORMAL HIGH (ref 98–111)
Chloride: 111 mmol/L (ref 98–111)
Creatinine, Ser: 2.86 mg/dL — ABNORMAL HIGH (ref 0.44–1.00)
Creatinine, Ser: 3.25 mg/dL — ABNORMAL HIGH (ref 0.44–1.00)
GFR calc Af Amer: 19 mL/min — ABNORMAL LOW (ref >60)
GFR calc Af Amer: 16 mL/min — ABNORMAL LOW (ref >60)
GFR calc non Af Amer: 17 mL/min — ABNORMAL LOW (ref >60)
GFR calc non Af Amer: 14 mL/min — ABNORMAL LOW (ref >60)
Glucose, Bld: 157 mg/dL — ABNORMAL HIGH (ref 70–99)
Glucose, Bld: 123 mg/dL — ABNORMAL HIGH (ref 70–99)
Potassium: 3.1 mmol/L — ABNORMAL LOW (ref 3.5–5.1)
Potassium: 3.8 mmol/L (ref 3.5–5.1)
Sodium: 146 mmol/L — ABNORMAL HIGH (ref 135–145)
Sodium: 147 mmol/L — ABNORMAL HIGH (ref 135–145)
Total Bilirubin: 0.7 mg/dL (ref 0.3–1.2)
Total Bilirubin: 1.4 mg/dL — ABNORMAL HIGH (ref 0.3–1.2)
Total Protein: 4.6 g/dL — ABNORMAL LOW (ref 6.5–8.1)
Total Protein: 5.3 g/dL — ABNORMAL LOW (ref 6.5–8.1)

## 2019-12-21 LAB — POCT I-STAT 7, (LYTES, BLD GAS, ICA,H+H)
Acid-base deficit: 7 mmol/L — ABNORMAL HIGH (ref 0.0–2.0)
Acid-base deficit: 6 mmol/L — ABNORMAL HIGH (ref 0.0–2.0)
Acid-base deficit: 7 mmol/L — ABNORMAL HIGH (ref 0.0–2.0)
Acid-base deficit: 8 mmol/L — ABNORMAL HIGH (ref 0.0–2.0)
Acid-base deficit: 5 mmol/L — ABNORMAL HIGH (ref 0.0–2.0)
Bicarbonate: 19.4 mmol/L — ABNORMAL LOW (ref 20.0–28.0)
Bicarbonate: 19.6 mmol/L — ABNORMAL LOW (ref 20.0–28.0)
Bicarbonate: 19.5 mmol/L — ABNORMAL LOW (ref 20.0–28.0)
Bicarbonate: 19.8 mmol/L — ABNORMAL LOW (ref 20.0–28.0)
Bicarbonate: 20.9 mmol/L (ref 20.0–28.0)
Calcium, Ion: 1.07 mmol/L — ABNORMAL LOW (ref 1.15–1.40)
Calcium, Ion: 0.96 mmol/L — ABNORMAL LOW (ref 1.15–1.40)
Calcium, Ion: 0.92 mmol/L — ABNORMAL LOW (ref 1.15–1.40)
Calcium, Ion: 1.06 mmol/L — ABNORMAL LOW (ref 1.15–1.40)
Calcium, Ion: 0.89 mmol/L — CL (ref 1.15–1.40)
HCT: 17 % — ABNORMAL LOW (ref 36.0–46.0)
HCT: 28 % — ABNORMAL LOW (ref 36.0–46.0)
HCT: 30 % — ABNORMAL LOW (ref 36.0–46.0)
HCT: 31 % — ABNORMAL LOW (ref 36.0–46.0)
HCT: 32 % — ABNORMAL LOW (ref 36.0–46.0)
Hemoglobin: 5.8 g/dL — CL (ref 12.0–15.0)
Hemoglobin: 9.5 g/dL — ABNORMAL LOW (ref 12.0–15.0)
Hemoglobin: 10.2 g/dL — ABNORMAL LOW (ref 12.0–15.0)
Hemoglobin: 10.5 g/dL — ABNORMAL LOW (ref 12.0–15.0)
Hemoglobin: 10.9 g/dL — ABNORMAL LOW (ref 12.0–15.0)
O2 Saturation: 100 %
O2 Saturation: 98 %
O2 Saturation: 87 %
O2 Saturation: 96 %
O2 Saturation: 90 %
Patient temperature: 37.1
Patient temperature: 36
Patient temperature: 35.8
Potassium: 4.1 mmol/L (ref 3.5–5.1)
Potassium: 4.3 mmol/L (ref 3.5–5.1)
Potassium: 4 mmol/L (ref 3.5–5.1)
Potassium: 4.2 mmol/L (ref 3.5–5.1)
Potassium: 3.9 mmol/L (ref 3.5–5.1)
Sodium: 147 mmol/L — ABNORMAL HIGH (ref 135–145)
Sodium: 147 mmol/L — ABNORMAL HIGH (ref 135–145)
Sodium: 148 mmol/L — ABNORMAL HIGH (ref 135–145)
Sodium: 150 mmol/L — ABNORMAL HIGH (ref 135–145)
Sodium: 150 mmol/L — ABNORMAL HIGH (ref 135–145)
TCO2: 21 mmol/L — ABNORMAL LOW (ref 22–32)
TCO2: 21 mmol/L — ABNORMAL LOW (ref 22–32)
TCO2: 21 mmol/L — ABNORMAL LOW (ref 22–32)
TCO2: 21 mmol/L — ABNORMAL LOW (ref 22–32)
TCO2: 22 mmol/L (ref 22–32)
pCO2 arterial: 46.5 mmHg (ref 32.0–48.0)
pCO2 arterial: 36.8 mmHg (ref 32.0–48.0)
pCO2 arterial: 44.8 mmHg (ref 32.0–48.0)
pCO2 arterial: 46.3 mmHg (ref 32.0–48.0)
pCO2 arterial: 36.6 mmHg (ref 32.0–48.0)
pH, Arterial: 7.229 — ABNORMAL LOW (ref 7.350–7.450)
pH, Arterial: 7.334 — ABNORMAL LOW (ref 7.350–7.450)
pH, Arterial: 7.233 — ABNORMAL LOW (ref 7.350–7.450)
pH, Arterial: 7.248 — ABNORMAL LOW (ref 7.350–7.450)
pH, Arterial: 7.358 (ref 7.350–7.450)
pO2, Arterial: 367 mmHg — ABNORMAL HIGH (ref 83.0–108.0)
pO2, Arterial: 109 mmHg — ABNORMAL HIGH (ref 83.0–108.0)
pO2, Arterial: 59 mmHg — ABNORMAL LOW (ref 83.0–108.0)
pO2, Arterial: 94 mmHg (ref 83.0–108.0)
pO2, Arterial: 58 mmHg — ABNORMAL LOW (ref 83.0–108.0)

## 2019-12-21 LAB — TRIGLYCERIDES: Triglycerides: 152 mg/dL — ABNORMAL HIGH (ref <150)

## 2019-12-21 LAB — MAGNESIUM
Magnesium: 2.7 mg/dL — ABNORMAL HIGH (ref 1.7–2.4)
Magnesium: 2.5 mg/dL — ABNORMAL HIGH (ref 1.7–2.4)

## 2019-12-21 LAB — PROTIME-INR
INR: 1.8 — ABNORMAL HIGH (ref 0.8–1.2)
INR: 0.9 (ref 0.8–1.2)
Prothrombin Time: 19.9 seconds — ABNORMAL HIGH (ref 11.4–15.2)
Prothrombin Time: 11.7 seconds (ref 11.4–15.2)

## 2019-12-21 LAB — PREPARE RBC (CROSSMATCH)

## 2019-12-21 LAB — APTT: aPTT: 40 seconds — ABNORMAL HIGH (ref 24–36)

## 2019-12-21 SURGERY — VIDEO ASSISTED THORACOSCOPY (VATS)/THOROCOTOMY
Anesthesia: General | Site: Chest

## 2019-12-21 MED ORDER — ORAL CARE MOUTH RINSE
15.0000 mL | OROMUCOSAL | Status: DC
  Administered 2019-12-21 – 2020-02-01 (×394): 15 mL via OROMUCOSAL

## 2019-12-21 MED ORDER — EPINEPHRINE 1 MG/10ML IJ SOSY: 0.0500 mg | PREFILLED_SYRINGE | Freq: Once | INTRAMUSCULAR | Status: AC

## 2019-12-21 MED ORDER — AMIODARONE HCL IN DEXTROSE 360-4.14 MG/200ML-% IV SOLN
30.0000 mg/h | INTRAVENOUS | Status: DC
  Administered 2019-12-22 – 2020-01-03 (×22): 30 mg/h via INTRAVENOUS
  Filled 2019-12-21 (×26): qty 200

## 2019-12-21 MED ORDER — SODIUM CHLORIDE 0.9 % IV SOLN: 1.0000 g | Freq: Three times a day (TID) | INTRAVENOUS | Status: DC

## 2019-12-21 MED ORDER — SODIUM CHLORIDE 0.9% IV SOLUTION: Freq: Once | INTRAVENOUS | Status: AC

## 2019-12-21 MED ORDER — PANTOPRAZOLE SODIUM 40 MG IV SOLR
40.0000 mg | INTRAVENOUS | Status: DC
  Administered 2019-12-21 – 2019-12-27 (×7): 40 mg via INTRAVENOUS
  Filled 2019-12-21 (×9): qty 40

## 2019-12-21 MED ORDER — ALBUMIN HUMAN 5 % IV SOLN: INTRAVENOUS | Status: DC | PRN

## 2019-12-21 MED ORDER — EPINEPHRINE 1 MG/10ML IJ SOSY
PREFILLED_SYRINGE | INTRAMUSCULAR | Status: AC
  Administered 2019-12-21: 0.5 mg via INTRAVENOUS
  Filled 2019-12-21: qty 10

## 2019-12-21 MED ORDER — MIDAZOLAM HCL 2 MG/2ML IJ SOLN
1.0000 mg | INTRAMUSCULAR | Status: AC | PRN
  Administered 2019-12-25 (×3): 1 mg via INTRAVENOUS
  Filled 2019-12-21: qty 2

## 2019-12-21 MED ORDER — FENTANYL CITRATE (PF) 100 MCG/2ML IJ SOLN
INTRAMUSCULAR | Status: AC
  Filled 2019-12-21: qty 2

## 2019-12-21 MED ORDER — AMIODARONE HCL IN DEXTROSE 360-4.14 MG/200ML-% IV SOLN
60.0000 mg/h | INTRAVENOUS | Status: AC
  Administered 2019-12-21: 60 mg/h via INTRAVENOUS

## 2019-12-21 MED ORDER — GLUCAGON HCL RDNA (DIAGNOSTIC) 1 MG IJ SOLR
INTRAMUSCULAR | Status: AC
  Administered 2019-12-21: 1 mg via INTRAVENOUS
  Filled 2019-12-21: qty 1

## 2019-12-21 MED ORDER — DEXMEDETOMIDINE HCL IN NACL 400 MCG/100ML IV SOLN
0.0000 ug/kg/h | INTRAVENOUS | Status: AC
  Administered 2019-12-21: 0.8 ug/kg/h via INTRAVENOUS
  Administered 2019-12-22: 0.2 ug/kg/h via INTRAVENOUS
  Administered 2019-12-23: 0.5 ug/kg/h via INTRAVENOUS
  Administered 2019-12-24: 0.7 ug/kg/h via INTRAVENOUS
  Administered 2019-12-24: 0.6 ug/kg/h via INTRAVENOUS
  Administered 2019-12-24: 0.8 ug/kg/h via INTRAVENOUS
  Filled 2019-12-21 (×6): qty 100

## 2019-12-21 MED ORDER — SODIUM BICARBONATE 8.4 % IV SOLN
INTRAVENOUS | Status: DC | PRN
Start: 2019-12-21 — End: 2019-12-21
  Administered 2019-12-21 (×2): 50 mL via INTRAVENOUS

## 2019-12-21 MED ORDER — FENTANYL CITRATE (PF) 100 MCG/2ML IJ SOLN
25.0000 ug | Freq: Once | INTRAMUSCULAR | Status: AC
  Administered 2019-12-21: 25 ug via INTRAVENOUS

## 2019-12-21 MED ORDER — VASOPRESSIN 20 UNIT/ML IV SOLN
0.0300 [IU]/min | INTRAVENOUS | Status: DC
  Administered 2019-12-21: .03 [IU]/min via INTRAVENOUS
  Administered 2019-12-21 – 2019-12-23 (×3): 0.03 [IU]/min via INTRAVENOUS
  Administered 2019-12-23: 0.01 [IU]/min via INTRAVENOUS
  Filled 2019-12-21 (×5): qty 2

## 2019-12-21 MED ORDER — MIDAZOLAM HCL 2 MG/2ML IJ SOLN
INTRAMUSCULAR | Status: AC
  Filled 2019-12-21: qty 2

## 2019-12-21 MED ORDER — ROCURONIUM BROMIDE 100 MG/10ML IV SOLN
INTRAVENOUS | Status: DC | PRN
Start: 2019-12-21 — End: 2019-12-21
  Administered 2019-12-21: 50 mg via INTRAVENOUS
  Administered 2019-12-21 (×2): 100 mg via INTRAVENOUS

## 2019-12-21 MED ORDER — SODIUM CHLORIDE 0.45 % IV SOLN: INTRAVENOUS | Status: DC

## 2019-12-21 MED ORDER — VASOPRESSIN 20 UNIT/ML IV SOLN
INTRAVENOUS | Status: DC | PRN
Start: 2019-12-21 — End: 2019-12-21
  Administered 2019-12-21 (×2): 2 [IU] via INTRAVENOUS
  Administered 2019-12-21: 4 [IU] via INTRAVENOUS
  Administered 2019-12-21: 1 [IU] via INTRAVENOUS

## 2019-12-21 MED ORDER — SODIUM BICARBONATE 8.4 % IV SOLN
INTRAVENOUS | Status: AC
  Filled 2019-12-21: qty 50

## 2019-12-21 MED ORDER — CEFAZOLIN SODIUM-DEXTROSE 2-3 GM-%(50ML) IV SOLR
INTRAVENOUS | Status: DC | PRN
  Administered 2019-12-21: 2 g via INTRAVENOUS

## 2019-12-21 MED ORDER — PROPOFOL 500 MG/50ML IV EMUL
INTRAVENOUS | Status: DC | PRN
  Administered 2019-12-21: 20 ug/kg/min via INTRAVENOUS

## 2019-12-21 MED ORDER — ROCURONIUM BROMIDE 50 MG/5ML IV SOLN
50.0000 mg | Freq: Once | INTRAVENOUS | Status: AC
  Administered 2019-12-21: 03:00:00 50 mg via INTRAVENOUS

## 2019-12-21 MED ORDER — ASPIRIN 325 MG PO TABS
325.0000 mg | ORAL_TABLET | Freq: Every day | ORAL | Status: DC
  Administered 2019-12-23 – 2020-01-27 (×35): 325 mg
  Filled 2019-12-21 (×37): qty 1

## 2019-12-21 MED ORDER — FENTANYL CITRATE (PF) 250 MCG/5ML IJ SOLN
INTRAMUSCULAR | Status: DC | PRN
  Administered 2019-12-21: 50 ug via INTRAVENOUS
  Administered 2019-12-21 (×2): 100 ug via INTRAVENOUS

## 2019-12-21 MED ORDER — PROPOFOL 1000 MG/100ML IV EMUL
5.0000 ug/kg/min | INTRAVENOUS | Status: DC
  Administered 2019-12-21: 60 ug/kg/min via INTRAVENOUS

## 2019-12-21 MED ORDER — FENTANYL 2500MCG IN NS 250ML (10MCG/ML) PREMIX INFUSION
25.0000 ug/h | INTRAVENOUS | Status: DC
  Administered 2019-12-21: 100 ug/h via INTRAVENOUS
  Administered 2019-12-24: 50 ug/h via INTRAVENOUS
  Administered 2019-12-25: 125 ug/h via INTRAVENOUS
  Administered 2019-12-25: 150 ug/h via INTRAVENOUS
  Administered 2019-12-26: 125 ug/h via INTRAVENOUS
  Administered 2019-12-27: 100 ug/h via INTRAVENOUS
  Administered 2019-12-28: 25 ug/h via INTRAVENOUS
  Administered 2019-12-30: 100 ug/h via INTRAVENOUS
  Filled 2019-12-21 (×9): qty 250

## 2019-12-21 MED ORDER — VANCOMYCIN HCL IN DEXTROSE 1-5 GM/200ML-% IV SOLN
1000.0000 mg | Freq: Once | INTRAVENOUS | Status: AC
  Administered 2019-12-21: 1000 mg via INTRAVENOUS
  Filled 2019-12-21: qty 200

## 2019-12-21 MED ORDER — SODIUM CHLORIDE 0.9% IV SOLUTION: Freq: Once | INTRAVENOUS | Status: DC

## 2019-12-21 MED ORDER — HYDROCORTISONE NA SUCCINATE PF 100 MG IJ SOLR
50.0000 mg | Freq: Four times a day (QID) | INTRAMUSCULAR | Status: DC
  Administered 2019-12-21 (×2): 50 mg via INTRAVENOUS
  Filled 2019-12-21 (×3): qty 2

## 2019-12-21 MED ORDER — HEMOSTATIC AGENTS (NO CHARGE) OPTIME
TOPICAL | Status: DC | PRN
  Administered 2019-12-21 (×4): 1 via TOPICAL

## 2019-12-21 MED ORDER — PROPOFOL 10 MG/ML IV BOLUS
INTRAVENOUS | Status: AC
  Filled 2019-12-21: qty 20

## 2019-12-21 MED ORDER — PROPOFOL 1000 MG/100ML IV EMUL
INTRAVENOUS | Status: AC
  Filled 2019-12-21: qty 100

## 2019-12-21 MED ORDER — CHLORHEXIDINE GLUCONATE 0.12% ORAL RINSE (MEDLINE KIT)
15.0000 mL | Freq: Two times a day (BID) | OROMUCOSAL | Status: DC
  Administered 2019-12-21 – 2020-02-01 (×82): 15 mL via OROMUCOSAL

## 2019-12-21 MED ORDER — SODIUM CHLORIDE 0.9 % IV SOLN: INTRAVENOUS | Status: DC | PRN

## 2019-12-21 MED ORDER — CALCIUM GLUCONATE-NACL 1-0.675 GM/50ML-% IV SOLN
1.0000 g | Freq: Once | INTRAVENOUS | Status: AC
  Administered 2019-12-21: 1000 mg via INTRAVENOUS
  Filled 2019-12-21: qty 50

## 2019-12-21 MED ORDER — PROTHROMBIN COMPLEX CONC HUMAN 500 UNITS IV KIT
1567.0000 [IU] | PACK | Status: AC
  Administered 2019-12-21: 1567 [IU] via INTRAVENOUS
  Filled 2019-12-21: qty 567

## 2019-12-21 MED ORDER — NOREPINEPHRINE 16 MG/250ML-% IV SOLN
0.0000 ug/min | INTRAVENOUS | Status: DC
  Administered 2019-12-21: 15 ug/min via INTRAVENOUS
  Administered 2019-12-21: 35 ug/min via INTRAVENOUS
  Administered 2019-12-21: 15 ug/min via INTRAVENOUS
  Filled 2019-12-21 (×3): qty 250

## 2019-12-21 MED ORDER — SODIUM CHLORIDE 0.9 % IV SOLN
2.0000 g | INTRAVENOUS | Status: DC
  Administered 2019-12-21 – 2019-12-25 (×5): 2 g via INTRAVENOUS
  Filled 2019-12-21 (×5): qty 2

## 2019-12-21 MED ORDER — EPINEPHRINE 1 MG/10ML IJ SOSY: 0.3000 mg | PREFILLED_SYRINGE | Freq: Once | INTRAMUSCULAR | Status: AC

## 2019-12-21 MED ORDER — AMIODARONE HCL IN DEXTROSE 360-4.14 MG/200ML-% IV SOLN
60.0000 mg/h | INTRAVENOUS | Status: AC
  Filled 2019-12-21: qty 200

## 2019-12-21 MED ORDER — PROTHROMBIN COMPLEX CONC HUMAN 500 UNITS IV KIT
1500.0000 [IU] | PACK | Status: DC
  Filled 2019-12-21: qty 1500

## 2019-12-21 MED ORDER — TRAMADOL HCL 50 MG PO TABS: 50.0000 mg | ORAL_TABLET | Freq: Two times a day (BID) | ORAL | Status: DC | PRN

## 2019-12-21 MED ORDER — ETOMIDATE 2 MG/ML IV SOLN
20.0000 mg | Freq: Once | INTRAVENOUS | Status: AC
  Administered 2019-12-21: 20 mg via INTRAVENOUS

## 2019-12-21 MED ORDER — CEFAZOLIN SODIUM-DEXTROSE 2-4 GM/100ML-% IV SOLN
2.0000 g | INTRAVENOUS | Status: DC
  Filled 2019-12-21: qty 100

## 2019-12-21 MED ORDER — SODIUM CHLORIDE 0.9 % IV SOLN
0.3000 ug/kg | Freq: Once | INTRAVENOUS | Status: AC
  Administered 2019-12-21: 20.4 ug via INTRAVENOUS
  Filled 2019-12-21: qty 5.1

## 2019-12-21 MED ORDER — CEFAZOLIN SODIUM 1 G IJ SOLR
INTRAMUSCULAR | Status: AC
  Filled 2019-12-21: qty 20

## 2019-12-21 MED ORDER — OXYCODONE HCL 5 MG PO TABS
5.0000 mg | ORAL_TABLET | ORAL | Status: DC | PRN
  Administered 2020-01-17 – 2020-01-24 (×2): 5 mg
  Filled 2019-12-21 (×3): qty 1

## 2019-12-21 MED ORDER — EPINEPHRINE 1 MG/10ML IJ SOSY
PREFILLED_SYRINGE | INTRAMUSCULAR | Status: AC
  Filled 2019-12-21: qty 10

## 2019-12-21 MED ORDER — FENTANYL CITRATE (PF) 250 MCG/5ML IJ SOLN
INTRAMUSCULAR | Status: AC
  Filled 2019-12-21: qty 5

## 2019-12-21 MED ORDER — NOREPINEPHRINE 4 MG/250ML-% IV SOLN
INTRAVENOUS | Status: AC
  Administered 2019-12-21: 40 ug/min
  Filled 2019-12-21: qty 250

## 2019-12-21 MED ORDER — MIDAZOLAM HCL 2 MG/2ML IJ SOLN
1.0000 mg | INTRAMUSCULAR | Status: DC | PRN
  Administered 2019-12-25 – 2020-01-05 (×10): 1 mg via INTRAVENOUS
  Filled 2019-12-21 (×11): qty 2

## 2019-12-21 MED ORDER — CALCIUM CHLORIDE 10 % IV SOLN
INTRAVENOUS | Status: DC | PRN
Start: 2019-12-21 — End: 2019-12-21
  Administered 2019-12-21 (×10): 200 mg via INTRAVENOUS

## 2019-12-21 MED ORDER — FENTANYL CITRATE (PF) 100 MCG/2ML IJ SOLN: 25.0000 ug | Freq: Once | INTRAMUSCULAR | Status: DC

## 2019-12-21 MED ORDER — EPINEPHRINE 1 MG/10ML IJ SOSY
PREFILLED_SYRINGE | INTRAMUSCULAR | Status: AC
  Administered 2019-12-21: 0.3 mg via INTRAVENOUS
  Filled 2019-12-21: qty 10

## 2019-12-21 MED ORDER — EPINEPHRINE 1 MG/10ML IJ SOSY: 0.5000 mg | PREFILLED_SYRINGE | Freq: Once | INTRAMUSCULAR | Status: AC

## 2019-12-21 MED ORDER — EPINEPHRINE 1 MG/10ML IJ SOSY
PREFILLED_SYRINGE | INTRAMUSCULAR | Status: AC
  Administered 2019-12-21: 0.1 mg via INTRAVENOUS
  Filled 2019-12-21: qty 10

## 2019-12-21 MED ORDER — FENTANYL BOLUS VIA INFUSION
25.0000 ug | INTRAVENOUS | Status: DC | PRN
  Administered 2019-12-23 – 2019-12-30 (×10): 25 ug via INTRAVENOUS
  Filled 2019-12-21: qty 25

## 2019-12-21 MED ORDER — POLYETHYLENE GLYCOL 3350 17 G PO PACK
17.0000 g | PACK | Freq: Every day | ORAL | Status: DC
  Administered 2019-12-22: 17 g via ORAL
  Filled 2019-12-21 (×2): qty 1

## 2019-12-21 MED ORDER — GLUCAGON HCL RDNA (DIAGNOSTIC) 1 MG IJ SOLR: 1.0000 mg | Freq: Once | INTRAMUSCULAR | Status: AC

## 2019-12-21 MED ORDER — CALCIUM CHLORIDE 10 % IV SOLN
INTRAVENOUS | Status: AC
  Filled 2019-12-21: qty 10

## 2019-12-21 MED ORDER — MIDAZOLAM HCL 2 MG/2ML IJ SOLN
INTRAMUSCULAR | Status: DC | PRN
  Administered 2019-12-21: 2 mg via INTRAVENOUS

## 2019-12-21 MED ORDER — VASOPRESSIN 20 UNIT/ML IV SOLN
INTRAVENOUS | Status: AC
  Filled 2019-12-21: qty 1

## 2019-12-21 MED ORDER — COAGULATION FACTOR VIIA RECOMB 1 MG IV SOLR
45.0000 ug/kg | Freq: Once | INTRAVENOUS | Status: AC
  Administered 2019-12-21: 3000 ug via INTRAVENOUS
  Filled 2019-12-21: qty 3

## 2019-12-21 MED ORDER — AMIODARONE HCL IN DEXTROSE 360-4.14 MG/200ML-% IV SOLN: 30.0000 mg/h | INTRAVENOUS | Status: DC

## 2019-12-21 MED ORDER — ROCURONIUM BROMIDE 10 MG/ML (PF) SYRINGE
PREFILLED_SYRINGE | INTRAVENOUS | Status: AC
  Filled 2019-12-21: qty 10

## 2019-12-21 MED ORDER — 0.9 % SODIUM CHLORIDE (POUR BTL) OPTIME
TOPICAL | Status: DC | PRN
  Administered 2019-12-21: 2000 mL

## 2019-12-21 MED ORDER — SODIUM CHLORIDE 0.9 % IV BOLUS
500.0000 mL | Freq: Once | INTRAVENOUS | Status: AC
  Administered 2019-12-21: 500 mL via INTRAVENOUS

## 2019-12-21 MED ORDER — GLUCAGON HCL RDNA (DIAGNOSTIC) 1 MG IJ SOLR
3.0000 mg/h | INTRAVENOUS | Status: DC
  Administered 2019-12-21: 3 mg/h via INTRAVENOUS
  Filled 2019-12-21 (×3): qty 5

## 2019-12-21 MED FILL — Medication: Qty: 1 | Status: AC

## 2019-12-21 SURGICAL SUPPLY — 89 items
ADH SKN CLS APL DERMABOND .7 (GAUZE/BANDAGES/DRESSINGS)
BANDAGE HEMOSTAT MRDH 4X4 STRL (MISCELLANEOUS) IMPLANT
BIT DRILL 7/64X5 DISP (BIT) ×4 IMPLANT
BLADE CLIPPER SURG (BLADE) ×2 IMPLANT
BLADE SURG 11 STRL SS (BLADE) ×2 IMPLANT
BNDG HEMOSTAT MRDH 4X4 STRL (MISCELLANEOUS) ×4
CANISTER SUCT 3000ML PPV (MISCELLANEOUS) ×8 IMPLANT
CATH KIT ON-Q SILVERSOAK 5 (CATHETERS) IMPLANT
CATH KIT ON-Q SILVERSOAK 5IN (CATHETERS) IMPLANT
CATH THORACIC 28FR (CATHETERS) ×2 IMPLANT
CATH THORACIC 28FR RT ANG (CATHETERS) ×2 IMPLANT
CATH THORACIC 36FR (CATHETERS) IMPLANT
CLIP VESOCCLUDE MED 24/CT (CLIP) ×2 IMPLANT
CLIP VESOCCLUDE MED 6/CT (CLIP) ×4 IMPLANT
CLIP VESOCCLUDE SM WIDE 24/CT (CLIP) ×2 IMPLANT
CLIP VESOCCLUDE SM WIDE 6/CT (CLIP) ×4 IMPLANT
CONN ST 1/4X3/8  BEN (MISCELLANEOUS) ×12
CONN ST 1/4X3/8 BEN (MISCELLANEOUS) IMPLANT
CONN Y 3/8X3/8X3/8  BEN (MISCELLANEOUS) ×12
CONN Y 3/8X3/8X3/8 BEN (MISCELLANEOUS) ×2 IMPLANT
DEFOGGER ANTIFOG KIT (MISCELLANEOUS) ×4 IMPLANT
DERMABOND ADVANCED (GAUZE/BANDAGES/DRESSINGS)
DERMABOND ADVANCED .7 DNX12 (GAUZE/BANDAGES/DRESSINGS) IMPLANT
DRAIN CHANNEL 32F RND 10.7 FF (WOUND CARE) ×2 IMPLANT
DRAPE HALF SHEET 40X57 (DRAPES) ×2 IMPLANT
DRAPE LAPAROSCOPIC ABDOMINAL (DRAPES) ×4 IMPLANT
ELECT REM PT RETURN 9FT ADLT (ELECTROSURGICAL) ×4
ELECTRODE REM PT RTRN 9FT ADLT (ELECTROSURGICAL) ×2 IMPLANT
GAUZE SPONGE 4X4 12PLY STRL (GAUZE/BANDAGES/DRESSINGS) ×6 IMPLANT
GLOVE BIO SURGEON STRL SZ7.5 (GLOVE) ×8 IMPLANT
GOWN STRL REUS W/ TWL LRG LVL3 (GOWN DISPOSABLE) ×4 IMPLANT
GOWN STRL REUS W/TWL LRG LVL3 (GOWN DISPOSABLE) ×8
HANDLE SUCTION POOLE (INSTRUMENTS) IMPLANT
HEMOSTAT SURGICEL 2X14 (HEMOSTASIS) ×2 IMPLANT
KIT BASIN OR (CUSTOM PROCEDURE TRAY) ×4 IMPLANT
KIT SUCTION CATH 14FR (SUCTIONS) ×4 IMPLANT
KIT TURNOVER KIT B (KITS) ×4 IMPLANT
NS IRRIG 1000ML POUR BTL (IV SOLUTION) ×16 IMPLANT
OIL SILICONE PENTAX (PARTS (SERVICE/REPAIRS)) ×2 IMPLANT
PACK CHEST (CUSTOM PROCEDURE TRAY) ×4 IMPLANT
PAD ARMBOARD 7.5X6 YLW CONV (MISCELLANEOUS) ×8 IMPLANT
PASSER SUT SWANSON 36MM LOOP (INSTRUMENTS) ×2 IMPLANT
SEALANT PATCH FIBRIN 2X4IN (MISCELLANEOUS) ×2 IMPLANT
SEALANT SURG COSEAL 4ML (VASCULAR PRODUCTS) IMPLANT
SPONGE LAP 18X18 RF (DISPOSABLE) ×2 IMPLANT
SPONGE LAP 4X18 RFD (DISPOSABLE) ×2 IMPLANT
SPONGE TONSIL TAPE 1.25 RFD (DISPOSABLE) ×8 IMPLANT
STAPLER VISISTAT 35W (STAPLE) ×2 IMPLANT
SUCTION POOLE HANDLE (INSTRUMENTS)
SUT CHROMIC 3 0 SH 27 (SUTURE) IMPLANT
SUT CHROMIC 4 0 SH 27 (SUTURE) ×4 IMPLANT
SUT ETHILON 3 0 FSL (SUTURE) ×2 IMPLANT
SUT ETHILON 3 0 PS 1 (SUTURE) IMPLANT
SUT PROLENE 3 0 SH DA (SUTURE) IMPLANT
SUT PROLENE 4 0 RB 1 (SUTURE) ×8
SUT PROLENE 4 0 SH DA (SUTURE) ×4 IMPLANT
SUT PROLENE 4-0 RB1 .5 CRCL 36 (SUTURE) ×2 IMPLANT
SUT SILK  1 MH (SUTURE) ×16
SUT SILK 1 MH (SUTURE) ×4 IMPLANT
SUT SILK 1 TIES 10X30 (SUTURE) ×2 IMPLANT
SUT SILK 2 0SH CR/8 30 (SUTURE) ×4 IMPLANT
SUT SILK 3 0SH CR/8 30 (SUTURE) IMPLANT
SUT VIC AB 1 CTX 18 (SUTURE) ×4 IMPLANT
SUT VIC AB 2-0 CT1 27 (SUTURE) ×4
SUT VIC AB 2-0 CT1 TAPERPNT 27 (SUTURE) IMPLANT
SUT VIC AB 2-0 CTX 27 (SUTURE) ×2 IMPLANT
SUT VIC AB 2-0 CTX 36 (SUTURE) IMPLANT
SUT VIC AB 3-0 MH 27 (SUTURE) IMPLANT
SUT VIC AB 3-0 SH 18 (SUTURE) IMPLANT
SUT VIC AB 3-0 SH 27 (SUTURE)
SUT VIC AB 3-0 SH 27X BRD (SUTURE) IMPLANT
SUT VIC AB 3-0 X1 27 (SUTURE) ×2 IMPLANT
SUT VICRYL 0 UR6 27IN ABS (SUTURE) ×2 IMPLANT
SUT VICRYL 2 TP 1 (SUTURE) IMPLANT
SUT VICRYL 4-0 PS2 18IN ABS (SUTURE) IMPLANT
SYR 30ML SLIP (SYRINGE) ×2 IMPLANT
SYSTEM SAHARA CHEST DRAIN ATS (WOUND CARE) ×4 IMPLANT
TAPE CLOTH SURG 4X10 WHT LF (GAUZE/BANDAGES/DRESSINGS) ×2 IMPLANT
TOWEL GREEN STERILE (TOWEL DISPOSABLE) ×8 IMPLANT
TRAP SPECIMEN MUCUS 40CC (MISCELLANEOUS) ×4 IMPLANT
TRAY FOLEY MTR SLVR 16FR STAT (SET/KITS/TRAYS/PACK) ×4 IMPLANT
TROCAR XCEL NON-BLD 11X100MML (ENDOMECHANICALS) ×2 IMPLANT
TROCAR XCEL NON-BLD 5MMX100MML (ENDOMECHANICALS) ×2 IMPLANT
TUBE CONNECTING 20'X1/4 (TUBING) ×2
TUBE CONNECTING 20X1/4 (TUBING) ×2 IMPLANT
VALVE BIOPSY  SINGLE USE (MISCELLANEOUS) ×8
VALVE BIOPSY SINGLE USE (MISCELLANEOUS) IMPLANT
VALVE SUCTION BRONCHIO DISP (MISCELLANEOUS) ×2 IMPLANT
WATER STERILE IRR 1000ML POUR (IV SOLUTION) ×8 IMPLANT

## 2019-12-21 NOTE — Progress Notes (Signed)
Patient transported to CT and back without any complications.  

## 2019-12-21 NOTE — Anesthesia Procedure Notes (Signed)
Central Venous Catheter Insertion Performed by: Nolon Nations, MD, anesthesiologist Start/End06/18/2021 2:05 PM, 12/21/2019 2:20 PM Patient location: Pre-op. Preanesthetic checklist: patient identified, IV checked, site marked, risks and benefits discussed, surgical consent, monitors and equipment checked, pre-op evaluation, timeout performed and anesthesia consent Position: Trendelenburg Lidocaine 1% used for infiltration and patient sedated Hand hygiene performed  and maximum sterile barriers used  Catheter size: 9 Fr Sheath introducer Procedure performed using ultrasound guided technique. Ultrasound Notes:anatomy identified, needle tip was noted to be adjacent to the nerve/plexus identified, no ultrasound evidence of intravascular and/or intraneural injection and image(s) printed for medical record Attempts: 1 Following insertion, line sutured, dressing applied and Biopatch. Post procedure assessment: blood return through all ports, free fluid flow and no air  Patient tolerated the procedure well with no immediate complications.

## 2019-12-21 NOTE — Progress Notes (Signed)
RT NOTE: Pt transported from [place] 10 to CT and back on full vent support with no complications noted.

## 2019-12-21 NOTE — Consult Note (Addendum)
Neurosurgery Consultation  Reason for Consult: Intracerebral hemorrhage Referring Physician: James Ivanoff  CC: Altered mental status  HPI: This is a 64 y.o. woman with a prior R MCA infarct s/p hemicraniectomy that is now on 2H for pleural effusion / hemothorax. She was given intrapleural tPA and developed hemorrhagic shock 2/2 hemothorax, had to go to the OR for thoracotomy. Afterwards, it was noted that her cranial flap was more swollen and repeat CTH was obtained, which showed areas of hemorrhage. Further history limited due to patient's mental status. However, the nurse that had her last night was on the unit and confirmed that her flap does indeed appear more full today. Per report, last night she was still able to open eyes to voice and follow some simple commands. From a hematologic perspective, from the [facility] it appears that she got a dose of novo7 at 16:30, [place] 1500U at 14:00, intrapleural alteplase 10mg  yesterday at 15:30, +[id number].   ROS: A 14 point ROS was performed and is negative except as noted in the HPI.   PMHx:  Past Medical History:  Diagnosis Date  . Chronic atrial fibrillation ([place]) 1990s   s/p DCCV then attempted ablation of complex A Flutter (@ [facility] - Dr. Deno Etienne), failed antiarrhythmics --> INR folllwed @ [facility] FP ->> now status post pacemaker placement with underlying A. fib.  . Diabetes mellitus type 2, uncontrolled, without complications    On Oral Medications ([place] [facility])  . Essential hypertension   . History of cardiac catheterization 2001   R&LHC - normal Coronaries, no evidence of Restictive Cardiomyopathy or Constrictive Pericarditis (also @ [facility])  . Hx of sick sinus syndrome 07/1999   Wtih symptomatic bradycardia - syncope (Tachy-Brady)  . 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 S/P placement of cardiac pacemaker 07/1999   FamHx:  Family History  Problem Relation Age of Onset  . Hypertension Mother   . Diabetes Sister   . Cancer Brother        Rectal  . Lung cancer Brother    SocHx:  reports that she has never smoked. She has never used smokeless tobacco. She reports current alcohol use. She reports that she does not use drugs.  Exam: Vital signs in last 24 hours: Temp:  [94.5 F (34.7 C)-99.1 F (37.3 C)] 97.3 F (36.3 C) (06/06 2115) Pulse Rate:  [55-142] 110 (06/06 2115) Resp:  [18-36] 20 (06/06 2115) BP: (43-168)/(31-76) 168/64 (06/06 2115) SpO2:  [94 %-100 %] 98 % (06/06 2115) Arterial Line BP: (105-172)/(47-74) 172/65 (06/06 2115) FiO2 (%):  [40 %-100 %] 50 % (06/06 1942) General: Laying in ICU bed, appears acutely ill Head: R cranial incision well healed, flap full and pulsatile HEENT: Neck supple Pulmonary: ETT in place, sat'ing well Cardiac: tachycardic Abdomen: bone flap abdominal incision well healed Extremities: BLE in boots, BUE w/ lines in place Neuro: Propofol paused for exam, no other known sedation onboard at this time Eyes closed to painful stim, PERRL with neutral gaze but no corneal response, no cough/gag, no response to painful stimulus in extremities Last paralytics appear to be roc 50mg  at 02:50 this morning   Assessment and Plan: 64 y.o. woman w/  prior R hemicrani for malignant MCA infarct, now w/ hemothorax s/p thoracotomy, intra-pleural tPA used yesterday but now s/p [facility] and novo7. [place] personally reviewed, which shows hemorrhage into the posterior R MCA distribution, which looks like it correlates with the worst perfusion defect on her CTP. Also remote hemorrhage in the left posterior temporal lobe, small cerebellar hemorrhages. Lateral ventricles appear enlarged from prior but difficult to evaluate  due to volume loss related to stroke, 4th appears grossly stable, +IVH with layering in the left occipital horn.  -exam doesn't localize well, but is concerning for progression, will repeat her CTH now to make sure hemorrhage(s) haven't progressed, re-evaluate ventricular  -will re-evaluate after CT, if no progression of hemorrhages then may be a candidate for EVD placement at least to better transduce a pressure and remove that variable from her exam. Given the multifocal nature of the hemorrhage, these appear c/w some sort of ongoing coagulopathy, will d/w husband that any cranial procedure will be very high risk.  Judith Part, MD 01/06/2020 9:43 PM [facility]  Addendum: repeat CTH reviewed, ventricles stable, hemorrhages appear grossly stable. There is no mass effect on the brainstem from either of the larger hemorrhages. I do not think she needs surgical evacuation or placement of a ventricular drain. She has a flap to follow pressures and it is full but not tense, which is reassuring. I am not sure why her exam is so poor, potentially could be a functional as opposed to structural problem. Given the pro-thrombotics she had to receive earlier, would discuss with neurology regarding their opinion and possibility of MRI versus EEG for further workup.

## 2019-12-21 NOTE — Progress Notes (Signed)
[facility] Progress Note Patient Name: Krystal Little DOB: 26-Jul-1955 MRN: [id number]   Date of Service  01/13/2020  HPI/Events of Note  Pt with abrupt hypotension and near cardiac arrest earlier tonight, lab now reporting a hemoglobin of 4.3 gm %, ABG earlier had hemoglobin at 5.8 gm %.  eICU Interventions  Stat H & H, transfuse 2 units PRBC, stat PT, PTT, INR, CBC with platelets, H & H Q 4 hours.        Kerry Kass Mataeo Ingwersen 12/23/2019, 5:56 AM

## 2019-12-21 NOTE — Progress Notes (Signed)
TEG scan results:

## 2019-12-21 NOTE — Procedures (Signed)
Arterial Catheter Insertion Procedure Note Krystal Little [id number] Nov 16, 1955  Procedure: Insertion of Arterial Catheter  Indications: Blood pressure monitoring and Frequent blood sampling  Procedure Details Consent: Unable to obtain consent because of emergent medical necessity. Time Out: Verified patient identification, verified procedure, site/side was marked, verified correct patient position, special equipment/implants available, medications/allergies/relevent history reviewed, required imaging and test results available.  Performed  Maximum sterile technique was used including antiseptics, cap, gloves, gown, hand hygiene, mask and sheet. Skin prep: Chlorhexidine; local anesthetic administered 20 gauge catheter was inserted into right radial artery using the Seldinger technique. ULTRASOUND GUIDANCE USED: NO Evaluation Blood flow good; BP tracing good. Complications: No apparent complications.   Krystal Little Krystal Little 01/10/2020

## 2019-12-21 NOTE — Progress Notes (Addendum)
NAME:  Krystal Little, MRN:  [id number], DOB:  April 09, 1956, LOS: 7 ADMISSION DATE:  12/06/2019, CONSULTATION DATE:  5/29 REFERRING MD:  Letta Pate, CHIEF COMPLAINT:  Chest congestion   Brief History   61 female with an extensive cardiac history was admitted to [facility] in April in the setting of a right MCA stroke, had a right interventional radiology guided thrombectomy, later had significant brain edema and underwent craniectomy, ultimately transferred to inpatient rehab.  Pulmonary and critical care medicine was consulted on May 29 in the setting of tachypnea and an enlarging left-sided pleural effusion. Thoracentesis was performed which showed a hemothorax.  A CT chest was then ordered showing obstruction of her left mainstem bronchus and a loculated left pleural effusion.  On May 30 she was moved to the ICU for chest tube placement and bronchoscopy.   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 Move to ICU for intubation to facilitate bronchoscopy and chest tube placement 5/31 Blood pressure labile overnight, renal function worse, oliguria overnight, WBC up,  TPA/ pulmonzyme x 1 for persistent left effusion 6/1 Afebrile, on low-dose Levophed, sedated on low-dose Precedex, Minimal output on chest tube- pulmozyme and tpa x 2, urine output picking up 6/2 Weaned x 7 hours yesterday PSV 15/5, oliguria overnight s/p 1L NS bolus (one unmeasured output not included, has purwick), tmax 100.3, 1.97L output from left pigtail  remains on NE 2 mcg/min, per RN has periods of acute anxiety- with tachypnea, tachycardia,  hypertension, precedex now at 1 mcg/kg/hr 6/3 afib with RVR placed on amio gtt overnight , 1U PRBC for Hb 7.1  6/5 extubated successfully, plan to repeat TPA/Pulmonzyme again  6/6 acute decompensation overnight, patient found hypotensive and unresponsive.  And hypovolemic shock with a hemoglobin of 4.8, emergent PRBCs ordered.  Concern for recurrent hemothorax versus retroperitoneal bleed  Consults:  PCCM  Procedures:  5/29 Thoracentesis >> 1.5 L bloody fluid 5/30 ETT >  5/30 L pigtail chest tube >  Bronchoscopy 5/30 >>  large, thick, grey mucus plug completely occluding the left mainstem bronchus.   Significant Diagnostic Tests:  CT head 4/18>  Worsened edema within the right MCA territory with increased size. of right basal ganglia intraparenchymal hematoma. Increased amount of subarachnoid blood over the right hemisphere. 11 mm of leftward midline shift at the level of the foramen of Monro with early herniation of the right cingulate gyrus beneath the falx cerebri  CT head 4/25 > 1. Continued interval evolution of large right MCA territory infarct with associated hemorrhagic transformation, relatively stable as compared to 11/06/2019. No evidence for interval hemorrhage or re-bleeding. Associated mass effect with up to 12 mm of right-to-left shift not significantly changed. Stable asymmetric dilatation of the left lateral ventricle. 2. Right craniectomy without adverse features.  5/29 Thoracentesis left chest >  8K WBC, 78% WBC, protein 4.0, LDH 182  Micro Data:  RVP 4/16 neg  MRSA PCR 4/17 neg   ======== 5/29 Left pleural fluid culture > negative 5/29 left pleural fluid cytology >> - No malignant cells identified  - Blood and acute inflammation.  5/30 BAL > ng  Antimicrobials:  4/24 cefepime > 4/25 4/28  zosyn > 5/4 5/30 zosyn >   Interim history/subjective:  Intubated but arousable to voice.  Objective   Blood pressure 139/71, pulse (!) 127, temperature 98.2 F  (36.8 C), resp. rate (!) 34, height _0  (1.626 m), weight 67.8 kg, SpO2 100 %. CVP:  [9 mmHg] 9 mmHg  Vent Mode: PRVC FiO2 (%):  [40 %-100 %] 40 % Set Rate:  [20 bmp] 20 bmp Vt Set:  [430 mL] 430 mL PEEP:  [5 cmH20] 5 cmH20 Plateau Pressure:  [21 cmH20-24 cmH20] 21 cmH20   Intake/Output Summary (Last 24 hours) at 12/20/2019 1109 Last data filed at 12/18/2019 1000 Gross per 24 hour  Intake 3480.72 ml  Output 650 ml  Net 2830.72 ml   Filed Weights   12/18/19 0500 12/19/19 0700 12/20/19 0500  Weight: 67.5 kg 67.2 kg 67.8 kg    Examination: General: Chronically ill appearing elderly female on mechanical ventilation, in NAD HEENT: ETT, MM pink/moist, PERRL, sclera nonicteric Neuro: Lethargic but arouses to verbal stimuli, able to follow simple commands CV: s1s2 regular rate and rhythm, no murmur, rubs, or gallops,  PULM: Clear to auscultation on left, diminished on right, chest tube in place with dark bloody drainage seen, tolerating vent well, GI: soft, bowel sounds active in all 4 quadrants, non-tender, non-distended Extremities: warm/dry, no edema  Skin: no rashes or lesions  Resolved Hospital Problem list   Septic shock Constipation   Assessment & Plan:  Hypovolemic shock -Patient seen with acute decompensation overnight including decreased mentation and hypotension.  Lab work revealed severe anemia with a hemoglobin of 4.3 -Concern for hydropneumothorax versus retroperitoneal bleed P: Status post 3 units packed red blood cells If additional blood products required we will also add unit of platelets and FFP Stat CT chest and abdomen Closely monitor blood pressure Likely will need to exchange chest tube for large bore tube  Continue pressor supprot   Acute respiratory failure with hypoxemia - due to occlusion of the left mainstem bronchus and large left  hemothorax/loculated parapneumonic effusion (not empyema based on cx) this initially resolved with patient successfully  extubated 6/5 however overnight of 6/6 patient decompensated and what appears to be hypovolemic shock resulting in reintubation Mucus plugging seen on bronchoscopy > likely aspiration Left pleural effusion  -S/P Thoracentesis with later placement of  L pigtail chest tube >  P: Continue ventilator support with lung protective strategies  Wean PEEP and FiO2 for sats greater than 90%. Head of bed elevated 30 degrees. Plateau pressures less than 30 cm H20.  Follow intermittent chest x-ray and ABG.   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 P: Remains stable  Continue neuro protective measures Supportive care PT/OT/SLP  DM2 with hyperglycemia P: Continue SSI CBG q4hrs SLP eval  Chronic atrial fibrillation/s/p pacemaker P: Continuous telemetry Continue to hold Eliquis Continue Cardizem   Protein calorie malnutrition  P: Continue TF  Best practice:  Diet: TFs Pain/Anxiety/Delirium protocol (  if indicated): yes, PAD protocol, precedex, prn fentanyl, prn versed RASS target 0 to -1 VAP protocol (if indicated): yes DVT prophylaxis: SCDs GI prophylaxis: famotidine Glucose control: SSI, lantus Mobility: bed rest Code Status: full Family Communication: Husband at bedside Disposition:  ICU   Signature  Johnsie Cancel, NP-C [place] /  Pager information can be found on Amion  01/03/2020, 11:09 AM

## 2019-12-21 NOTE — Procedures (Signed)
Intubation Procedure Note Krystal Little [id number] 07-15-1956  Procedure: Intubation Indications: Airway protection and maintenance  Procedure Details Consent: Unable to obtain consent because of emergent medical necessity. Time Out: Verified patient identification, verified procedure, site/side was marked, verified correct patient position, special equipment/implants available, medications/allergies/relevent history reviewed, required imaging and test results available.  Performed  Maximum sterile technique was used including cap, gloves, hand hygiene and mask.  MAC and 3    Evaluation Hemodynamic Status: Persistent hypotension treated with pressors; O2 sats: stable throughout Patient's Current Condition: unstable Complications: No apparent complications Patient did tolerate procedure well. Chest X-ray ordered to verify placement.  CXR: tube position low-repostitioned.  Grade 1 view on first attempt, unable to pass ETT  In laryngeal inlet, patient was subsequently bagged, she maintained her O2 sats at 100% between 2 attempts.  Second time bougie was advanced into trachea and grade 1 view then ET tube was advanced over the bougie.   Tally Due 12/22/2019

## 2019-12-21 NOTE — Progress Notes (Signed)
  Tried both numbers listed to reach patient's spouse [name] to update him, but did not get an answer.  Otilio Carpen Harm Jou, PA-C

## 2019-12-21 NOTE — Progress Notes (Signed)
ETT retracted 3cm per MD order.  ETT secured at 22 cm at the lips.

## 2019-12-21 NOTE — Brief Op Note (Addendum)
11/28/2019 - 12/18/2019  5:01 PM  PATIENT:  Krystal Little  64 y.o. female  PRE-OPERATIVE DIAGNOSIS:  Left Hemothorax  POST-OPERATIVE DIAGNOSIS:  Left Hemothorax  PROCEDURE:  LEFT THORACOTOMY MAJOR, DRAIN HEMATOMA. Video bronchoscopy to clear L endobronchial secretions FINDINGS: Lung adhesions, lung inflammation, hemothorax.  2 L hemothorax drained. Fluid sent for culture and cytology L main bronchus occluded with secretions- cultured SURGEON:  Surgeon(s) and Role:    Ivin Poot, MD - Primary  PHYSICIAN ASSISTANT: Lars Pinks PA-C  ANESTHESIA:   Little  EBL:  2300 mL   BLOOD ADMINISTERED:1100 CC CELLSAVER and 8 FFP, 2 Cryo, and Factor 7  DRAINS: 28 French chest tube, 28 and 32 Blake drains placed in the left pleural space   SPECIMEN:  Source of Specimen:  Left hemothorax  DISPOSITION OF SPECIMEN:  Culture  COUNTS CORRECT:  YES  DICTATION: .Dragon Dictation  PLAN OF CARE: Admit to inpatient   PATIENT DISPOSITION:  ICU - intubated and hemodynamically stable.   Delay start of Pharmacological VTE agent (>24hrs) due to surgical blood loss or risk of bleeding: yes

## 2019-12-21 NOTE — Progress Notes (Signed)
A few minutes before 0230 the RN was in the room at the bedside and talked to Ms. Vecchiarelli with her responding back saying she was okay. At 0230 Ms. Wenk was found to be hypotensive and was no longer responding. The RN checked her pulse, which was found, and had another RN hit the elink button in the room. The RN dropped her HOB and recycled her BP. She was sating appropriately, but remained hypotensive. The [facility] team came in and assisted. Epi was given per order and 1076ml bolus was given, which was later revised to a 551ml bolus per Odette Fraction, MD at the bedside. Norepinephrine gtt was started and titrated up was we tried to regain a BP wnl. Before being intubated, Ms. Vankirk was responsive. EKG was completed. Labs sent per order. Foley placed per order. Hgb noted to be low, Elink contacted who ordered 2 units of PRBCs. 1 Unit initiated just before change of shift. STAT CT of Chest and Abd pending after blood is given per Wyn Forster, MD. Family updated on events this AM and the POC moving forward. Report given and day shift RN updated on POC.

## 2019-12-21 NOTE — Progress Notes (Signed)
Critical event note:  Situation: Patient was found to be hypotensive with altered level of consciousness.  eICU was contacted and patient was started on peripheral Levophed and 500 cc IV fluid bolus.  Patient was also noted to have agonal breathing and when we arrived at the bedside she was being bagged by respiratory therapist.  Patient continued to have hypotension and was requiring escalating doses of Levophed up to 30 MCG.  Left-sided chest tube was attached to suction at 20 cm of water.  Vital signs reviewed and as documented in the chart  Physical exam: Patient has eyes open but not following any commands, appears to be in moderate respiratory distress Decreased breath sounds on the left, good breath sounds on the right Left-sided chest tube in place and was to suction at -20 cm of water  Assessment/plan  Unclear etiology for sudden deterioration, patient was able to maintain O2 sats of 100% with Ambu bag placed on her face.  Her telemetry recording showed V paced rhythm.  Patient was emergently intubated and required 2 attempts and the use of bougie.  Please see separate procedure note for details.  Chest x-ray she was dense consolidation of the left side, no pneumothorax, left pigtail catheter in place.  I would like to get CT chest but patient is unstable at this point.   Differential diagnosis for this event include mucous plugging with subsequent atelectasis, which may have opened up with bag mask ventilation, worsening aspiration pneumonia, cardiac event.  Unlikely to be pulmonary embolism with no tachycardia and obvious consolidation on chest x-ray.  Draw blood cultures, CBC, CMP, mag, VBG Check twelve-lead EKG Will DC Cardizem and metoprolol Patient requiring escalating doses of Levophed, will add vasopressin and stress dose steroids Switch Augmentin to vancomycin and cefepime  40 minutes of critical care time spent excluding procedures which are documented separately.

## 2019-12-21 NOTE — Anesthesia Preprocedure Evaluation (Signed)
Anesthesia Evaluation   Patient unresponsive    Reviewed: Allergy & Precautions, Patient's Chart, lab work & pertinent test results, reviewed documented beta blocker date and time , Unable to perform ROS - Chart review onlyPreop documentation limited or incomplete due to emergent nature of procedure.  History of Anesthesia Complications Negative for: history of anesthetic complications  Airway Mallampati: Intubated  TM Distance: >3 FB     Dental  (+) Dental Advisory Given, Teeth Intact   Pulmonary neg pulmonary ROS, neg recent URI,    breath sounds clear to auscultation       Cardiovascular hypertension, Pt. on medications and Pt. on home beta blockers + dysrhythmias Atrial Fibrillation + pacemaker + Valvular Problems/Murmurs   S/p MVR  Normal LV size and thickness.  EF estimated 50%.  Inferobasal HK and flattened septum c/w with elevated PA pressures. Mild functional mitral stenosis at rest (postoperative).  Moderate left atrial dilation and mild right atrial dilation.  Mean PA pressures estimated 52 mmHg.     Neuro/Psych CVA, Residual Symptoms negative psych ROS   GI/Hepatic negative GI ROS, Neg liver ROS,   Endo/Other  diabetes, Type 2, Oral Hypoglycemic Agents  Renal/GU negative Renal ROS     Musculoskeletal   Abdominal   Peds  Hematology  (+) anemia ,   Anesthesia Other Findings   Reproductive/Obstetrics                             Anesthesia Physical  Anesthesia Plan  ASA: IV and emergent  Anesthesia Plan: General   Post-op Pain Management:    Induction: Inhalational  PONV Risk Score and Plan: 3 and Ondansetron, Dexamethasone and Treatment may vary due to age or medical condition  Airway Management Planned: Oral ETT and Double Lumen EBT  Additional Equipment: Arterial line, CVP and Ultrasound Guidance Line Placement  Intra-op Plan:   Post-operative Plan: Post-operative  intubation/ventilation  Informed Consent: I have reviewed the patients History and Physical, chart, labs and discussed the procedure including the risks, benefits and alternatives for the proposed anesthesia with the patient or authorized representative who has indicated his/her understanding and acceptance.     History available from chart only and Only emergency history available  Plan Discussed with: CRNA  Anesthesia Plan Comments:         Anesthesia Quick Evaluation

## 2019-12-21 NOTE — Progress Notes (Signed)
Back from the OR on NE 1 and vaso 0.03. Repeat labs ordered.  Mild edema around R hemicraniectomy site (had been protected by donut during positioning in OR. STAT head CT to ensure no bleeding complications due to bleeding associated coagulopathy.  Husband updated via phone. He has already been updated by Dr. Prescott Gum.  Julian Hy, DO 12/30/2019 6:35 PM  Pulmonary & Critical Care

## 2019-12-21 NOTE — Progress Notes (Signed)
PHARMACY NOTE:  ANTIMICROBIAL RENAL DOSAGE ADJUSTMENT  Current antimicrobial regimen includes a mismatch between antimicrobial dosage and estimated renal function.  As per policy approved by the Pharmacy & Therapeutics and Medical Executive Committees, the antimicrobial dosage will be adjusted accordingly.  Current antimicrobial dosage:  Cefepime 1gm IV q8h  Indication: sepsis  Renal Function:  Estimated Creatinine Clearance: 21.1 mL/min (A) (by C-G formula based on SCr of 2.51 mg/dL (H)).    Antimicrobial dosage has been changed to:  Cefepime 2gm IV q24h  Additional comments: Will await further Vancomycin orders - one time 1gm dose ordered by MD   Thank you for allowing pharmacy to be a part of this patient's care.  Sherlon Handing, PharmD, BCPS Please see amion for complete clinical pharmacist phone list 12/20/2019 3:41 AM

## 2019-12-21 NOTE — Progress Notes (Signed)
SLP Cancellation Note  Patient Details Name: Krystal Little MRN: [id number] DOB: [birth year]   Cancelled treatment:       Reason Eval/Treat Not Completed: Medical issues which prohibited therapy.  Pt was re-intubated early this morning.  SLP will f/u as appropriate.    Elvia Collum [name] 01/10/2020, 1:02 PM

## 2019-12-21 NOTE — Transfer of Care (Signed)
Immediate Anesthesia Transfer of Care Note  Patient: Krystal Little  Procedure(s) Performed: Video Assisted Thoracoscopy (Vats) left , Drainage of Hemothorax (Left Chest) Video Bronchoscopy (N/A Bronchus)  Patient Location: SICU  Anesthesia Type:General  Level of Consciousness: sedated and Patient remains intubated per anesthesia plan  Airway & Oxygen Therapy: Patient remains intubated per anesthesia plan and Patient placed on Ventilator (see vital sign flow sheet for setting)  Post-op Assessment: Report given to RN and Post -op Vital signs reviewed and stable  Post vital signs: Reviewed and stable  Last Vitals:  Vitals Value Taken Time  BP 123/67 01/10/2020 1806  Temp    Pulse 107 12/18/2019 1806  Resp 20 12/30/2019 1806  SpO2 99 % 01/06/2020 1806    Last Pain:  Vitals:   01/06/2020 1207  TempSrc: Bladder  PainSc:       Patients Stated Pain Goal: 0 (77/03/40 3524)  Complications: No apparent anesthesia complications

## 2019-12-21 NOTE — Progress Notes (Signed)
[facility] Progress Note Patient Name: Krystal Little DOB: 06/06/1956 MRN: [id number]   Date of Service  12/25/2019  HPI/Events of Note  Received sign out from Dr. Carlis Abbott regarding this patient.   Radiology called regarding CT head results showing a large intracranial bleed.   Neurosurgery immediately called regarding the CT findings.   eICU Interventions  Complete transfusion of FFP.  Repeat coags.     Intervention Category Major Interventions: Other:  Krystal Little 01/06/2020, 8:53 PM

## 2019-12-21 NOTE — Anesthesia Procedure Notes (Signed)
Date/Time: 12/22/2019 1:50 PM Performed by: Renato Shin, CRNA Pre-anesthesia Checklist: Patient identified, Emergency Drugs available, Suction available and Patient being monitored Patient Re-evaluated:Patient Re-evaluated prior to induction Oxygen Delivery Method: Circle system utilized Preoxygenation: Pre-oxygenation with 100% oxygen Induction Type: Inhalational induction Tube size: 8.0 mm Placement Confirmation: positive ETCO2 and breath sounds checked- equal and bilateral Secured at: 22 cm Tube secured with: Tape Dental Injury: Teeth and Oropharynx as per pre-operative assessment  Comments: Received intubated from [place] 10

## 2019-12-21 NOTE — Progress Notes (Signed)
Camera assistance with near code situation. Orders read back, verified and placed per Dr Tally Due

## 2019-12-21 NOTE — Progress Notes (Signed)
[name] Progress Note Patient Name: Krystal Little DOB: 09-19-1955 MRN: [id number]   Date of Service  12/16/2019  HPI/Events of Note  Pt with severe hypotension and altered mental status, she recently received a dose of beta blocker and Calcium channel blocker, blod sugar is 167 mg %. Respirations are agonal.  eICU Interventions  NS 500 ml iv fluid bolus, Glucagon 5 mg iv then infusion at 3 mg / hour, Epinephrine 50 mcg iv x 1, Norepinephrine infusion started, RT instructed to begin bagging patient and prepare for intubation, bedside PCCM texted to come to the room stat for intubation and further management, they are on scene now. I handed patient over to them.        Artha Stavros U Shantana Christon 12/18/2019, 3:00 AM

## 2019-12-21 NOTE — Anesthesia Procedure Notes (Signed)
Procedure Name: Intubation Date/Time: 01/01/2020 2:20 PM Performed by: Renato Shin, CRNA Pre-anesthesia Checklist: Patient identified, Emergency Drugs available, Suction available and Patient being monitored Patient Re-evaluated:Patient Re-evaluated prior to induction Oxygen Delivery Method: Circle system utilized Preoxygenation: Pre-oxygenation with 100% oxygen Induction Type: IV induction Laryngoscope Size: Glidescope and 3 Grade View: Grade I Endobronchial tube: Left, Double lumen EBT, EBT position confirmed by fiberoptic bronchoscope and EBT position confirmed by auscultation and 37 Fr Number of attempts: 1 Airway Equipment and Method: Oral airway and Rigid stylet Placement Confirmation: ETT inserted through vocal cords under direct vision,  positive ETCO2 and breath sounds checked- equal and bilateral Tube secured with: Tape Dental Injury: Teeth and Oropharynx as per pre-operative assessment

## 2019-12-22 ENCOUNTER — Inpatient Hospital Stay (HOSPITAL_COMMUNITY): Payer: Medicare Other

## 2019-12-22 DIAGNOSIS — N179 Acute kidney failure, unspecified: Secondary | ICD-10-CM

## 2019-12-22 LAB — PREPARE FRESH FROZEN PLASMA
Unit division: 0
Unit division: 0
Unit division: 0
Unit division: 0
Unit division: 0
Unit division: 0
Unit division: 0
Unit division: 0
Unit division: 0
Unit division: 0

## 2019-12-22 LAB — BPAM FFP
Blood Product Expiration Date: 202106082359
Blood Product Expiration Date: 202106092359
Blood Product Expiration Date: 202106102359
Blood Product Expiration Date: 202106112359
Blood Product Expiration Date: 202106112359
Blood Product Expiration Date: 202106112359
Blood Product Expiration Date: 202106112359
Blood Product Expiration Date: 202106112359
Blood Product Expiration Date: 202106112359
Blood Product Expiration Date: 202106112359
Blood Product Expiration Date: 202106112359
Blood Product Expiration Date: 202106112359
ISSUE DATE / TIME: 202106061258
ISSUE DATE / TIME: 202106061258
ISSUE DATE / TIME: 202106061258
ISSUE DATE / TIME: 202106061305
ISSUE DATE / TIME: 202106061351
ISSUE DATE / TIME: 202106061351
ISSUE DATE / TIME: 202106061351
ISSUE DATE / TIME: 202106061351
ISSUE DATE / TIME: 202106061858
ISSUE DATE / TIME: 202106061858
Unit Type and Rh: 5100
Unit Type and Rh: 5100
Unit Type and Rh: 5100
Unit Type and Rh: 5100
Unit Type and Rh: 5100
Unit Type and Rh: 5100
Unit Type and Rh: 5100
Unit Type and Rh: 5100
Unit Type and Rh: 5100
Unit Type and Rh: 600
Unit Type and Rh: 6200
Unit Type and Rh: 6200

## 2019-12-22 LAB — BPAM RBC
Blood Product Expiration Date: 202106102359
Blood Product Expiration Date: 202107022359
Blood Product Expiration Date: 202107052359
ISSUE DATE / TIME: 202106031431
ISSUE DATE / TIME: 202106060629
ISSUE DATE / TIME: 202106060629
Unit Type and Rh: 5100
Unit Type and Rh: 5100
Unit Type and Rh: 5100

## 2019-12-22 LAB — BASIC METABOLIC PANEL
Anion gap: 14 (ref 5–15)
BUN: 68 mg/dL — ABNORMAL HIGH (ref 8–23)
CO2: 20 mmol/L — ABNORMAL LOW (ref 22–32)
Calcium: 7.9 mg/dL — ABNORMAL LOW (ref 8.9–10.3)
Chloride: 112 mmol/L — ABNORMAL HIGH (ref 98–111)
Creatinine, Ser: 3.25 mg/dL — ABNORMAL HIGH (ref 0.44–1.00)
GFR calc Af Amer: 16 mL/min — ABNORMAL LOW (ref >60)
GFR calc non Af Amer: 14 mL/min — ABNORMAL LOW (ref >60)
Glucose, Bld: 131 mg/dL — ABNORMAL HIGH (ref 70–99)
Potassium: 3.8 mmol/L (ref 3.5–5.1)
Sodium: 146 mmol/L — ABNORMAL HIGH (ref 135–145)

## 2019-12-22 LAB — TYPE AND SCREEN
ABO/RH(D): O POS
Antibody Screen: NEGATIVE
Unit division: 0
Unit division: 0
Unit division: 0

## 2019-12-22 LAB — BPAM CRYOPRECIPITATE
Blood Product Expiration Date: 202106062005
Blood Product Expiration Date: 202106062005
ISSUE DATE / TIME: 202106061637
ISSUE DATE / TIME: 202106061637
Unit Type and Rh: 5100
Unit Type and Rh: 5100

## 2019-12-22 LAB — CBC
HCT: 26.8 % — ABNORMAL LOW (ref 36.0–46.0)
HCT: 28.2 % — ABNORMAL LOW (ref 36.0–46.0)
HCT: 28.8 % — ABNORMAL LOW (ref 36.0–46.0)
Hemoglobin: 8.9 g/dL — ABNORMAL LOW (ref 12.0–15.0)
Hemoglobin: 9.3 g/dL — ABNORMAL LOW (ref 12.0–15.0)
Hemoglobin: 9.6 g/dL — ABNORMAL LOW (ref 12.0–15.0)
MCH: 29.4 pg (ref 26.0–34.0)
MCH: 29.1 pg (ref 26.0–34.0)
MCH: 29.7 pg (ref 26.0–34.0)
MCHC: 33.2 g/dL (ref 30.0–36.0)
MCHC: 33 g/dL (ref 30.0–36.0)
MCHC: 33.3 g/dL (ref 30.0–36.0)
MCV: 88.4 fL (ref 80.0–100.0)
MCV: 88.1 fL (ref 80.0–100.0)
MCV: 89.2 fL (ref 80.0–100.0)
Platelets: 81 10*3/uL — ABNORMAL LOW (ref 150–400)
Platelets: 89 10*3/uL — ABNORMAL LOW (ref 150–400)
Platelets: 98 10*3/uL — ABNORMAL LOW (ref 150–400)
RBC: 3.03 MIL/uL — ABNORMAL LOW (ref 3.87–5.11)
RBC: 3.2 MIL/uL — ABNORMAL LOW (ref 3.87–5.11)
RBC: 3.23 MIL/uL — ABNORMAL LOW (ref 3.87–5.11)
RDW: 16 % — ABNORMAL HIGH (ref 11.5–15.5)
RDW: 16 % — ABNORMAL HIGH (ref 11.5–15.5)
RDW: 16.4 % — ABNORMAL HIGH (ref 11.5–15.5)
WBC: 15.9 10*3/uL — ABNORMAL HIGH (ref 4.0–10.5)
WBC: 16.6 10*3/uL — ABNORMAL HIGH (ref 4.0–10.5)
WBC: 18.6 10*3/uL — ABNORMAL HIGH (ref 4.0–10.5)
nRBC: 0.3 % — ABNORMAL HIGH (ref 0.0–0.2)
nRBC: 0.4 % — ABNORMAL HIGH (ref 0.0–0.2)
nRBC: 0.3 % — ABNORMAL HIGH (ref 0.0–0.2)

## 2019-12-22 LAB — CYTOLOGY - NON PAP

## 2019-12-22 LAB — PREPARE CRYOPRECIPITATE
Unit division: 0
Unit division: 0

## 2019-12-22 LAB — GLUCOSE, CAPILLARY
Glucose-Capillary: 111 mg/dL — ABNORMAL HIGH (ref 70–99)
Glucose-Capillary: 126 mg/dL — ABNORMAL HIGH (ref 70–99)
Glucose-Capillary: 126 mg/dL — ABNORMAL HIGH (ref 70–99)
Glucose-Capillary: 129 mg/dL — ABNORMAL HIGH (ref 70–99)
Glucose-Capillary: 164 mg/dL — ABNORMAL HIGH (ref 70–99)
Glucose-Capillary: 222 mg/dL — ABNORMAL HIGH (ref 70–99)

## 2019-12-22 LAB — POCT I-STAT 7, (LYTES, BLD GAS, ICA,H+H)
Acid-base deficit: 2 mmol/L (ref 0.0–2.0)
Bicarbonate: 22.8 mmol/L (ref 20.0–28.0)
Calcium, Ion: 1.1 mmol/L — ABNORMAL LOW (ref 1.15–1.40)
HCT: 24 % — ABNORMAL LOW (ref 36.0–46.0)
Hemoglobin: 8.2 g/dL — ABNORMAL LOW (ref 12.0–15.0)
O2 Saturation: 99 %
Patient temperature: 36.1
Potassium: 3.7 mmol/L (ref 3.5–5.1)
Sodium: 147 mmol/L — ABNORMAL HIGH (ref 135–145)
TCO2: 24 mmol/L (ref 22–32)
pCO2 arterial: 36.3 mmHg (ref 32.0–48.0)
pH, Arterial: 7.403 (ref 7.350–7.450)
pO2, Arterial: 122 mmHg — ABNORMAL HIGH (ref 83.0–108.0)

## 2019-12-22 LAB — PROTIME-INR
INR: 1.1 (ref 0.8–1.2)
Prothrombin Time: 13.3 seconds (ref 11.4–15.2)

## 2019-12-22 LAB — PREPARE RBC (CROSSMATCH)

## 2019-12-22 MED ORDER — DOCUSATE SODIUM 50 MG/5ML PO LIQD
100.0000 mg | Freq: Two times a day (BID) | ORAL | Status: DC
  Administered 2019-12-24 – 2020-01-28 (×31): 100 mg
  Filled 2019-12-22 (×45): qty 10

## 2019-12-22 MED ORDER — PRO-STAT SUGAR FREE PO LIQD
30.0000 mL | Freq: Every day | ORAL | Status: DC
  Administered 2019-12-22 – 2020-01-21 (×30): 30 mL
  Filled 2019-12-22 (×30): qty 30

## 2019-12-22 MED ORDER — VITAL AF 1.2 CAL PO LIQD
1000.0000 mL | ORAL | Status: DC
  Administered 2019-12-22 – 2020-01-12 (×21): 1000 mL
  Filled 2019-12-22: qty 1000

## 2019-12-22 MED ORDER — CALCIUM GLUCONATE-NACL 1-0.675 GM/50ML-% IV SOLN
1.0000 g | Freq: Once | INTRAVENOUS | Status: AC
  Administered 2019-12-22: 1000 mg via INTRAVENOUS
  Filled 2019-12-22: qty 50

## 2019-12-22 NOTE — Progress Notes (Addendum)
NAME:  Krystal Little, MRN:  [id number], DOB:  1956-07-03, LOS: 8 ADMISSION DATE:  12/03/2019, CONSULTATION DATE:  5/29 REFERRING MD:  Letta Pate, CHIEF COMPLAINT:  Chest congestion   Brief History   67 female with an extensive cardiac history was admitted to [facility] in April in the setting of a right MCA stroke, had a right interventional radiology guided thrombectomy, later had significant brain edema and underwent craniectomy, ultimately transferred to inpatient rehab.  Pulmonary and critical care medicine was consulted on May 29 in the setting of tachypnea and an enlarging left-sided pleural effusion. Thoracentesis was performed which showed a hemothorax.  A CT chest was then ordered showing obstruction of her left mainstem bronchus and a loculated left pleural effusion.  On May 30 she was moved to the ICU for chest tube placement and bronchoscopy.   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 Move to ICU for intubation to facilitate bronchoscopy and chest tube placement 5/31 TPA/ pulmonzyme started for 3 days for persistent left effusion 6/1 Afebrile, on low-dose Levophed, sedated on low-dose Precedex, Minimal output on chest tube- pulmozyme and tpa x 2, urine output picking up 6/2 Weaned x 7 hours yesterday PSV 15/5, pulmozyme and TPA completed 3 days 6/3 afib with RVR placed on amio gtt overnight , 1U PRBC for Hb 7.1  6/5 extubated successfully, Repeat TPA/Pulmonzyme given again  6/6 acute decompensation overnight, hemorrhagic shock > to OR for left VATS with 2L hemothorax drained.  CT head with new ICH>  neurosurgery consulted  Consults:  PCCM  Procedures:  5/29 Thoracentesis >> 1.5 L bloody fluid 5/30 ETT >  5/30 L pigtail chest tube >  Bronchoscopy 5/30 >>  large, thick, grey mucus plug completely occluding the left mainstem bronchus.   Significant Diagnostic Tests:  5/29 Thoracentesis left chest >  8K WBC, 78% WBC, protein 4.0, LDH 182  CT chest abdomen pelvis 6/6 > large left hemithorax, patchy right lung opacities with small right effusion.  No acute abdominal process  CT head 6/6 > evolution of right MCA infarct with hemorrhagic transformation with large intraparenchymal hematoma, 2 additional small hemorrhages in the inferior cerebellum and left temporal lobe.  Trace subarachnoid blood.  Micro Data:  RVP 4/16 neg  MRSA PCR  4/17 neg   ======== 5/29 Left pleural fluid culture > negative 5/29 left pleural fluid cytology >> - No malignant cells identified  - Blood and acute inflammation.  5/30 BAL > ng  Antimicrobials:  4/24 cefepime > 4/25 4/28 zosyn > 5/4 5/30 zosyn >   Interim history/subjective:  Intubated but opens eyes to voice, tracks.  Does not follow commands.  Objective   Blood pressure (!) 168/64, pulse (!) 41, temperature (!) 97.2 F (36.2 C), temperature source Core, resp. rate 20, height _0  (1.626 m), weight 74 kg, SpO2 98 %.    Vent Mode: PRVC FiO2 (%):  [40 %-50 %] 40 % Set Rate:  [20 bmp] 20 bmp Vt Set:  [430 mL] 430 mL PEEP:  [5 cmH20] 5 cmH20 Plateau Pressure:  [20 cmH20-22 cmH20] 20 cmH20   Intake/Output Summary (Last 24 hours) at 12/22/2019 0954 Last data  filed at 12/22/2019 0800 Gross per 24 hour  Intake 13413.37 ml  Output 3210 ml  Net 10203.37 ml   Filed Weights   12/19/19 0700 12/20/19 0500 12/22/19 0452  Weight: 67.2 kg 67.8 kg 74 kg    Examination: General: Chronically ill appearing elderly female on mechanical ventilation, in NAD HEENT: ETT, MM pink/moist, Sclera nonicteric Neuro: Somnolent but opens eyes to voice and  tracks appropriately CV: s1s2 regular rate and rhythm, no murmur, rubs, or gallops,  PULM: Diminished bilaterally, lchest tube in place with dark bloody drainage seen GI: soft, bowel sounds active in all 4 quadrants, non-tender, non-distended Extremities: warm/dry, no edema  Skin: no rashes or lesions   Assessment & Plan:   Hemorrhagic shock - 2/2 hemothorax.  Resolved s/p multiple transfusions and OR 6/6 for VATS. P: Continue to follow H/H and chest tube output. Continue supportive care.  Acute respiratory failure with hypoxemia Mucus plugging seen on bronchoscopy > likely aspiration Left pleural effusion -S/P Thoracentesis with later placement of  L pigtail chest tube  P: Continue ventilator support with lung protective strategies. Continue abx and follow cultures. Continue chest tube per TCTS. Follow CXR.  AKI - presumed 2/2 shock.   No evidence of obstruction on renal US. Hypernatremia. P: Avoid nephrotoxins, ensure adequate renal perfusion. Continue D51/2 NS and free water per tube. 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  Evaluted by neurosurgery who felt that CTs were stable and no surgical evacuation or EVD was warranted. P: Continue to follow right skull flap, ensure does not tighten (currently full but not tight). Might need MRI vs EEG after pro-thrombotics were given to stabilize bleed. Continue neuro protective measures Supportive care PT/OT/SLP  DM2 with hyperglycemia. P: Continue SSI CBG q4hrs SLP eval  Chronic atrial fibrillation/s/p pacemaker P: Continuous telemetry Continue to hold Eliquis Continue amiodarone  Protein calorie malnutrition  P: Continue TF  Best practice:  Diet: TFs Pain/Anxiety/Delirium protocol (if indicated): yes, PAD protocol, precedex, prn fentanyl, prn versed RASS target 0 to -1 VAP protocol (if  indicated): yes DVT prophylaxis: SCDs GI prophylaxis: famotidine Glucose control: SSI, lantus Mobility: bed rest Code Status: full Family Communication: None available. Disposition:  ICU  CC time: 35 min.   Montey Hora, [facility] 12/22/2019, 10:17 AM

## 2019-12-22 NOTE — Procedures (Addendum)
Cortrak  Person Inserting Tube:  Deckard Stuber, RD Tube Type:  Cortrak - 43 inches Tube Location:  Right nare Initial Placement:  Stomach Secured by: Bridle Technique Used to Measure Tube Placement:  Documented cm marking at nare/ corner of mouth Cortrak Secured At:  65 cm   No x-ray is required. RN may begin using tube.   If the tube becomes dislodged please keep the tube and contact the Cortrak team at [email or web address] ([email or web address]) for replacement.  If after hours and replacement cannot be delayed, place a NG tube and confirm placement with an abdominal x-ray.   Mariana Single RD, LDN Clinical Nutrition Pager listed in [place]

## 2019-12-22 NOTE — Progress Notes (Signed)
Sputum sample sent to lab per MD order 

## 2019-12-22 NOTE — Anesthesia Postprocedure Evaluation (Signed)
Anesthesia Post Note  Patient: Krystal Little  Procedure(s) Performed: Video Assisted Thoracoscopy (Vats) left , Drainage of Hemothorax (Left Chest) Video Bronchoscopy (N/A Bronchus)     Patient location during evaluation: SICU Anesthesia Type: General Level of consciousness: patient remains intubated per anesthesia plan Pain management: pain level controlled Vital Signs Assessment: post-procedure vital signs reviewed and stable Respiratory status: patient remains intubated per anesthesia plan and patient on ventilator - see flowsheet for VS Cardiovascular status: stable Anesthetic complications: no    Last Vitals:  Vitals:   01/05/2020 2315 12/20/2019 2328  BP:    Pulse:  92  Resp: 20   Temp: (!) 36.4 C   SpO2:  100%    Last Pain:  Vitals:   12/18/2019 2022  TempSrc: Bladder  PainSc:                  Nolon Nations

## 2019-12-22 NOTE — Progress Notes (Signed)
PT Cancellation Note  Patient Details Name: Krystal Little MRN: [id number] DOB: 1955/12/24   Cancelled Treatment:    Reason Eval/Treat Not Completed: Medical issues which prohibited therapy Per RN, pt not appropriate for PT today; likely progression of hemorrhages. Will follow.   Marguarite Arbour A Kadisha Goodine 12/22/2019, 7:28 AM Marisa Severin, PT, DPT Acute Rehabilitation Services Pager [phone] Office [phone]

## 2019-12-22 NOTE — Op Note (Signed)
NAME: Krystal Little, Krystal Little MEDICAL RECORD CL:[id number] ACCOUNT [id number] DATE OF BIRTH:1956-01-21 FACILITY: [facility] LOCATION: [facility] PHYSICIAN:Dominyk Law VAN TRIGT III, MD  OPERATIVE REPORT  DATE OF PROCEDURE:  12/29/2019  OPERATIONS: 1.  Left VATS (video-assisted thoracoscopic surgery) with drainage of left hemothorax. 2.  Fiberoptic bronchoscopy for airway secretions.  SURGEON:  Ivin Poot, MD  ASSISTANT:  Lars Pinks, PA-C.  ANESTHESIA:  General by Dr. Nolon Nations.   PREOPERATIVE DIAGNOSES:   1.  Left loculated effusion 2.  Left chest tube placement. 3.  Left hemothorax.  POSTOPERATIVE DIAGNOSES:   1.  Left loculated effusion 2.  Left chest tube placement. 3.  Left hemothorax.  CLINICAL NOTE:  The patient is a chronically ill 64 year old female who developed bleeding from a chest tube placed when she was being treated by the critical care pulmonary service for loculated effusion and pneumonia and possible empyema.  When the  bleeding became significant and her hemoglobin dropped to 4 g, the thoracic surgical evaluation was requested.  I reviewed her CT scan and examined the patient and discussed with the patient for coordination of care with her critical care physician.  I  discussed the procedure with the patient and her husband.  The patient had been intubated due to her deterioration in her condition.  I discussed the plan for exploration of the left hemithorax with a VATS, the goal being to drain the blood to allow the  lung to reexpand and to treat the source of bleeding.  The patient's husband understood that she was having life-threatening bleeding and needed direct intervention.  Informed consent was obtained.  DESCRIPTION OF PROCEDURE:  The patient was brought directly on the ventilator to the operating room and placed supine on the operating table.  A double lumen endotracheal tube was placed by the anesthesia team.  The patient was then turned to left side   up.  The previously placed pigtail chest tube was removed and the left chest was prepped and draped as a sterile field.  Preoperative antibiotics had been given.  A proper time-out was performed.  A small incision was made at the 5th interspace anterior to the scapula.  The pleural space was entered and there was a large amount of dark blood.  At the end of the operation, almost 2 L of blood had been drained from the left hemithorax.  The pleural space was obliterated with adhesions from previous cardiac surgery almost 20 years ago.  Using careful dissection, the lower lobe and the fissure and the upper lobe were mobilized as could be safely done.  There was diffuse inflammation of  the lung, consistent with inflammation/infection.  There was diffuse bleeding consistent with coagulopathy.  The left hemidiaphragm, the pericardium and the chest wall were all visualized and no specific site of bleeding was noted.  The surface of the  left lower lobe and the anterior surface of the left upper lobe were visualized and no discrete source of bleeding was noted.  I used a warm irrigation to clear the hemothorax for visualization.  I then placed a topical hemostatic agent, including Evarrest and Surgicel on the surface of the raw areas of the lung.  I placed FloSeal topical medical hemostatic agent along the apex.  I then placed a fluted chest tube to drain the posterior paraspinal gutter in the anterior part of the chest and an angled chest tube to the subpulmonic space and brought out these tubes in separate incisions and secured them to the skin.  The incision was then closed.  I placed 2 pericostal sutures to reapproximate the ribs.  The muscle layers were closed with interrupted #1 Vicryl and the subcutaneous layer was closed with a running Vicryl.  The skin was closed with staples.  The chest  tubes were connected to an underwater seal Pleur-Evac drainage.  There was minimal air leak.  The patient was  then turned and a chest x-ray was taken in the operating room.  This showed atelectasis of the lung.  At this point, the patient underwent video bronchoscopy.  I visualized the distal trachea and mainstem bronchi and carina.  There was  heavy secretions, including the orifice of the left main stem bronchus and this was cleared with irrigation and suctioning.  Eventually, the endobronchial segments of the upper lobe and lower lobe on the left side were all opened and visualized.  There  was some blood in the airway.  The right airway was irrigated of a scant amount of secretions.  The bronchoscope was withdrawn.  Cultures of the endobronchial secretions were sent for culture.  Intraoperative fluid was sent for cytology as well as cultures.  The patient then had the double lumen endotracheal tube exchanged for a single lumen tube and returned to the ICU in stable  condition.  VN/NUANCE  D:12/25/2019 T:01/08/2020 JOB:[id number]

## 2019-12-22 NOTE — Progress Notes (Signed)
OT Cancellation Note  Patient Details Name: Krystal Little MRN: [id number] DOB: 1956-05-28   Cancelled Treatment:    Reason Eval/Treat Not Completed: Patient not medically ready; Per RN, pt not appropriate for therapies today; likely progression of hemorrhages. Will follow up as able.  Lou Cal, OT Acute Rehabilitation Services Pager [phone] Office [phone]   Raymondo Band 12/22/2019, 7:42 AM

## 2019-12-22 NOTE — Progress Notes (Signed)
1 Day Post-Op Procedure(s) (LRB): Video Assisted Thoracoscopy (Vats) left , Drainage of Hemothorax (Left) Video Bronchoscopy (N/A) Subjective: Sedated on vent but showing some response Chest tube output < 50cc/hr CXR with airspace disease LLL, chest tubes in good position No air leak from chest tubes Hb stable > 8, creat rising with low urine output  Objective: Vital signs in last 24 hours: Temp:  [94.8 F (34.9 C)-99.1 F (37.3 C)] 97 F (36.1 C) (06/07 0500) Pulse Rate:  [25-136] 88 (06/07 0500) Cardiac Rhythm: Atrial fibrillation (06/06 2000) Resp:  [15-34] 20 (06/07 0500) BP: (107-168)/(54-74) 168/64 (06/06 2115) SpO2:  [96 %-100 %] 98 % (06/07 0500) Arterial Line BP: (105-192)/(52-74) 123/57 (06/07 0500) FiO2 (%):  [40 %-50 %] 40 % (06/07 0408) Weight:  [74 kg] 74 kg (06/07 0452)  Hemodynamic parameters for last 24 hours:    Intake/Output from previous day: 06/06 0701 - 06/07 0700 In: 13759.5 [I.V.:4527.6; Blood:6621.5; IV Piggyback:2300.5] Out: 6160 [Urine:380; Blood:2300; Chest Tube:530] Intake/Output this shift: No intake/output data recorded.       Exam    General- sedated on vent    Neck- no JVD, no cervical adenopathy palpable, no carotid bruit   Lungs- clear without rales, wheezes   Cor- regular rate and rhythm, no murmur , gallop   Abdomen- soft, non-tender   Extremities - warm, non-tender, minimal edema   Neuro- oriented, appropriate, no focal weakness   Lab Results: Recent Labs    12/27/2019 1856 01/10/2020 2248 12/22/19 0339 12/22/19 0348  WBC 14.5*  --  15.9*  --   HGB 9.9*   < > 8.9* 8.2*  HCT 29.5*   < > 26.8* 24.0*  PLT 83*  --  81*  --    < > = values in this interval not displayed.   BMET:  Recent Labs    12/16/2019 1856 12/26/2019 1856 12/22/19 0339 12/22/19 0348  NA 147*   < > 146* 147*  K 3.8   < > 3.8 3.7  CL 111  --  112*  --   CO2 20*  --  20*  --   GLUCOSE 123*  --  131*  --   BUN 67*  --  68*  --   CREATININE 3.25*  --   3.25*  --   CALCIUM 8.0*  --  7.9*  --    < > = values in this interval not displayed.    PT/INR:  Recent Labs    12/22/19 0339  LABPROT 13.3  INR 1.1   ABG    Component Value Date/Time   PHART 7.403 12/22/2019 0348   HCO3 22.8 12/22/2019 0348   TCO2 24 12/22/2019 0348   ACIDBASEDEF 2.0 12/22/2019 0348   O2SAT 99.0 12/22/2019 0348   CBG (last 3)  Recent Labs    12/23/2019 1958 12/22/19 0035 12/22/19 0343  GLUCAP 92 126* 111*    Assessment/Plan: S/P Procedure(s) (LRB): Video Assisted Thoracoscopy (Vats) left , Drainage of Hemothorax (Left) Video Bronchoscopy (N/A) followup OR bronchial washings culture LLL Hold anticoagulation , plts 80k  LOS: 8 days    Tharon Aquas Trigt III 12/22/2019

## 2019-12-22 NOTE — Progress Notes (Signed)
[facility] Progress Note Patient Name: Krystal Little DOB: [birth year] MRN: [id number]   Date of Service  12/22/2019  HPI/Events of Note  Notified of TEG results.    CK R time is prolonged at 12.1 (4.6-9.1).  Pt already transfused 2 units FFP.  Last INR at 0.9.  eICU Interventions  Repeat coags, CBC in the AM.     Intervention Category Intermediate Interventions: Other:  Elsie Lincoln 12/22/2019, 12:14 AM

## 2019-12-22 NOTE — Progress Notes (Signed)
Nutrition Follow-up  DOCUMENTATION CODES:   Not applicable  INTERVENTION:   Tube Feeding via Cortrak:  Vital AF 1.2 to 60 ml/hr Add Pro-Stat 30 mL daily Provides 1828 kcals, 112 g of protein and 1094 mL of free water Meets 100% estimated calorie and protein needs  NUTRITION DIAGNOSIS:   Inadequate oral intake related to acute illness as evidenced by NPO status.  Being addressed via TF   GOAL:   Patient will meet greater than or equal to 90% of their needs  Progressing  MONITOR:   Vent status, TF tolerance, Labs, Weight trends  REASON FOR ASSESSMENT:   Consult, Ventilator Enteral/tube feeding initiation and management  ASSESSMENT:   64 yo female admitted from [place] after being at [facility] in April in setting of R. MCA stroke requiring thrombectomy, developed significant brain edema requiring craniectomy. Pt developed L pleural effusion on Rehab requiring thoracentesis and ultimately required intubation due to resp failure from occlusion of L. Mainstem bronchus and large L hemothorax, bronch and chest tube placement. Pt did require Cortrak placement with TF initially but was taking po prior to readmission from Rehab. PMH DM, HTN, R. MCA stroke, SSS  5/29 Thoracentesis 5/30 Intubated, Chest tube 60/4 Cortrak placed 6/06 Left VATS with drainage of left hemothorax  Cortrak in place, ok to resume TF today per CCM  2+ BLE edema, nonpitting in BUE and face  Hypernatremia, noted Free water 200 mL q 4 hours  Labs: sodium 147 (H), BUN 68, Creatinine  Meds: ss novolog    Diet Order:   Diet Order            Diet NPO time specified  Diet effective now              EDUCATION NEEDS:   Not appropriate for education at this time  Skin:  Skin Assessment: Skin Integrity Issues: Skin Integrity Issues:: Other (Comment) Other: fissure to buttocks, venous stasis ulcers on legs, MASD to buttocks  Last BM:  6/6  Height:   Ht Readings from Last 1 Encounters:   11/19/2019 5\' 4"  (1.626 m)    Weight:   Wt Readings from Last 1 Encounters:  12/22/19 74 kg    BMI:  Body mass index is 28 kg/m.  Estimated Nutritional Needs:   Kcal:  1525-1830 kcals  Protein:  90-120 g  Fluid:  >/= 1.6 L   Kerman Passey MS, RDN, LDN, CNSC Registered Dietitian III RD Pager Number and RD On-Call Pager Number Located in [place]

## 2019-12-22 NOTE — Progress Notes (Signed)
°  NEUROSURGERY PROGRESS NOTE   No issues overnight.   EXAM:  BP (!) 168/64    Pulse (!) 41    Temp (!) 97.2 F (36.2 C) (Core)    Resp 20    Ht 5\' 4"  (2.072 m)    Wt 74 kg    SpO2 98%    BMI 28.00 kg/m   Intubated Intubated, fentanyl and precedex running Opens eyes to voice, tracks Follows commands by wiggling toes on right Crani incision: healed well. Area is boggy. No drainage.  IMPRESSION/PLAN 64 y.o. female currently admitted for hemothorax and altered mental status, s/p right hemicraniectomy after right MCA stroke approx 1 month ago, with new cerebellar hemorrhage with intraventicular extension. Improved exam this am compared to prior. No role for NS intervention. Continue supportive care.

## 2019-12-22 NOTE — Addendum Note (Signed)
Addendum  created 12/22/19 1503 by Josephine Igo, CRNA   Order list changed

## 2019-12-23 ENCOUNTER — Inpatient Hospital Stay (HOSPITAL_COMMUNITY): Payer: Medicare Other

## 2019-12-23 DIAGNOSIS — Z951 Presence of aortocoronary bypass graft: Secondary | ICD-10-CM

## 2019-12-23 LAB — GLUCOSE, CAPILLARY
Glucose-Capillary: 244 mg/dL — ABNORMAL HIGH (ref 70–99)
Glucose-Capillary: 245 mg/dL — ABNORMAL HIGH (ref 70–99)
Glucose-Capillary: 269 mg/dL — ABNORMAL HIGH (ref 70–99)
Glucose-Capillary: 277 mg/dL — ABNORMAL HIGH (ref 70–99)
Glucose-Capillary: 289 mg/dL — ABNORMAL HIGH (ref 70–99)
Glucose-Capillary: 297 mg/dL — ABNORMAL HIGH (ref 70–99)
Glucose-Capillary: 308 mg/dL — ABNORMAL HIGH (ref 70–99)

## 2019-12-23 LAB — CBC
HCT: 27.4 % — ABNORMAL LOW (ref 36.0–46.0)
HCT: 25.5 % — ABNORMAL LOW (ref 36.0–46.0)
Hemoglobin: 9.1 g/dL — ABNORMAL LOW (ref 12.0–15.0)
Hemoglobin: 8.5 g/dL — ABNORMAL LOW (ref 12.0–15.0)
MCH: 29.4 pg (ref 26.0–34.0)
MCH: 29.7 pg (ref 26.0–34.0)
MCHC: 33.2 g/dL (ref 30.0–36.0)
MCHC: 33.3 g/dL (ref 30.0–36.0)
MCV: 88.7 fL (ref 80.0–100.0)
MCV: 89.2 fL (ref 80.0–100.0)
Platelets: 103 10*3/uL — ABNORMAL LOW (ref 150–400)
Platelets: 98 10*3/uL — ABNORMAL LOW (ref 150–400)
RBC: 3.09 MIL/uL — ABNORMAL LOW (ref 3.87–5.11)
RBC: 2.86 MIL/uL — ABNORMAL LOW (ref 3.87–5.11)
RDW: 16.3 % — ABNORMAL HIGH (ref 11.5–15.5)
RDW: 16.1 % — ABNORMAL HIGH (ref 11.5–15.5)
WBC: 19.7 10*3/uL — ABNORMAL HIGH (ref 4.0–10.5)
WBC: 16.8 10*3/uL — ABNORMAL HIGH (ref 4.0–10.5)
nRBC: 0.3 % — ABNORMAL HIGH (ref 0.0–0.2)
nRBC: 0.2 % (ref 0.0–0.2)

## 2019-12-23 LAB — COMPREHENSIVE METABOLIC PANEL
ALT: 150 U/L — ABNORMAL HIGH (ref 0–44)
AST: 334 U/L — ABNORMAL HIGH (ref 15–41)
Albumin: 2 g/dL — ABNORMAL LOW (ref 3.5–5.0)
Alkaline Phosphatase: 145 U/L — ABNORMAL HIGH (ref 38–126)
Anion gap: 17 — ABNORMAL HIGH (ref 5–15)
BUN: 78 mg/dL — ABNORMAL HIGH (ref 8–23)
CO2: 19 mmol/L — ABNORMAL LOW (ref 22–32)
Calcium: 8 mg/dL — ABNORMAL LOW (ref 8.9–10.3)
Chloride: 104 mmol/L (ref 98–111)
Creatinine, Ser: 3.7 mg/dL — ABNORMAL HIGH (ref 0.44–1.00)
GFR calc Af Amer: 14 mL/min — ABNORMAL LOW (ref >60)
GFR calc non Af Amer: 12 mL/min — ABNORMAL LOW (ref >60)
Glucose, Bld: 323 mg/dL — ABNORMAL HIGH (ref 70–99)
Potassium: 3.6 mmol/L (ref 3.5–5.1)
Sodium: 140 mmol/L (ref 135–145)
Total Bilirubin: 0.8 mg/dL (ref 0.3–1.2)
Total Protein: 5.9 g/dL — ABNORMAL LOW (ref 6.5–8.1)

## 2019-12-23 LAB — PHOSPHORUS: Phosphorus: 6.5 mg/dL — ABNORMAL HIGH (ref 2.5–4.6)

## 2019-12-23 LAB — MAGNESIUM: Magnesium: 2.4 mg/dL (ref 1.7–2.4)

## 2019-12-23 MED ORDER — POLYETHYLENE GLYCOL 3350 17 G PO PACK
17.0000 g | PACK | Freq: Every day | ORAL | Status: DC
  Administered 2019-12-25 – 2019-12-31 (×4): 17 g
  Filled 2019-12-23 (×7): qty 1

## 2019-12-23 MED ORDER — SODIUM CHLORIDE 0.9% FLUSH
10.0000 mL | INTRAVENOUS | Status: DC | PRN
  Administered 2020-01-06: 40 mL

## 2019-12-23 MED ORDER — INSULIN ASPART 100 UNIT/ML ~~LOC~~ SOLN
2.0000 [IU] | SUBCUTANEOUS | Status: DC
  Administered 2019-12-23 – 2019-12-24 (×6): 3 [IU] via SUBCUTANEOUS

## 2019-12-23 MED ORDER — SODIUM CHLORIDE 0.9% FLUSH
10.0000 mL | Freq: Two times a day (BID) | INTRAVENOUS | Status: DC
  Administered 2019-12-24 – 2019-12-25 (×5): 10 mL
  Administered 2019-12-26: 40 mL
  Administered 2019-12-26 – 2020-01-01 (×13): 10 mL
  Administered 2020-01-02: 20 mL
  Administered 2020-01-03 – 2020-01-04 (×3): 10 mL
  Administered 2020-01-04 – 2020-01-05 (×2): 20 mL
  Administered 2020-01-05 – 2020-01-14 (×17): 10 mL
  Administered 2020-01-15: 30 mL
  Administered 2020-01-15 – 2020-01-26 (×23): 10 mL
  Administered 2020-01-27: 30 mL
  Administered 2020-01-27 – 2020-01-29 (×4): 10 mL
  Administered 2020-01-29: 40 mL
  Administered 2020-01-30 – 2020-02-01 (×5): 10 mL

## 2019-12-23 NOTE — Progress Notes (Signed)
PT Cancellation Note  Patient Details Name: Krystal Little MRN: [id number] DOB: Aug 21, 1955   Cancelled Treatment:    Reason Eval/Treat Not Completed: Medical issues which prohibited therapy. Per RN, pt currently unresponsive, not appropriate for PT treatment session. Will follow-up as appropriate.  Mabeline Caras, PT, DPT Acute Rehabilitation Services  Pager [phone] Office [place] 12/23/2019, 8:27 AM

## 2019-12-23 NOTE — Progress Notes (Addendum)
Called to assess Krystal Little,  Bleeding. Area cleansed, dried. Unable to draw back or obtain waveform. Removed. No further orders to replace at this time.

## 2019-12-23 NOTE — Progress Notes (Signed)
  NEUROSURGERY PROGRESS NOTE   No issues overnight.  EXAM:  BP (!) 156/81   Pulse 67   Temp 98.1 F (36.7 C)   Resp (!) 23   Ht 5\' 4"  (1.626 m)   Wt 77 kg   SpO2 97%   BMI 29.14 kg/m   Intubated Intubated, fentanyl and precedex running Opens eyes to voice, tracks Not following commands this am Crani incision: healed well. Area is boggy. No drainage.  IMPRESSION/PLAN 64 y.o. female s/p right hemicraniectomy for stroke, now admitted with loculated pleural effusion who underwent pleurex catheter placement and required thoracotomy. She received 15mg  tPA through the catheter and was noted to have change in mental status. CT revealed right temporal hemorrhage conversion of the previous stroke with new posterior fossa hemorrhagic strokes. There is some ventriculomegaly R>L which suspect is ex-vacuo. She is relatively stable this am neurologically.  - Remains no role for NS intervention.  - continue supportive care

## 2019-12-23 NOTE — Progress Notes (Addendum)
Inpatient Diabetes Program Recommendations  AACE/ADA: New Consensus Statement on Inpatient Glycemic Control (2015)  Target Ranges:  Prepandial:   less than 140 mg/dL      Peak postprandial:   less than 180 mg/dL (1-2 hours)      Critically ill patients:  140 - 180 mg/dL   Lab Results  Component Value Date   GLUCAP 289 (H) 12/23/2019   HGBA1C 9.7 (H) 11/01/2019    Review of Glycemic Control Results for Krystal Little, Krystal Little (MRN [id number]) as of 12/23/2019 11:03  Ref. Range 12/22/2019 15:27 12/22/2019 20:38 12/23/2019 00:04 12/23/2019 04:34 12/23/2019 08:12  Glucose-Capillary Latest Ref Range: 70 - 99 mg/dL 164 (H) 222 (H) 277 (H) 308 (H) 289 (H)   Inpatient Diabetes Program Recommendations:   Consider adding tube feed coverage 2-3 units q 4 hrs. (hold if tube feed stopped or held for any reason).  Thank you, Nani Gasser. Dearis Danis, RN, MSN, CDE  Diabetes Coordinator Inpatient Glycemic Control Team Team Pager [phone] (8am-5pm) 12/23/2019 11:04 AM

## 2019-12-23 NOTE — Progress Notes (Signed)
NAME:  Krystal Little, MRN:  [id number], DOB:  1955-10-09, LOS: 9 ADMISSION DATE:  11/25/2019, CONSULTATION DATE:  5/29 REFERRING MD:  Letta Pate, CHIEF COMPLAINT:  Chest congestion   Brief History   14 female with an extensive cardiac history was admitted to [facility] in April in the setting of a right MCA stroke, had a right interventional radiology guided thrombectomy, later had significant brain edema and underwent craniectomy, ultimately transferred to inpatient rehab.  Pulmonary and critical care medicine was consulted on May 29 in the setting of tachypnea and an enlarging left-sided pleural effusion. Thoracentesis was performed which showed a hemothorax.  A CT chest was then ordered showing obstruction of her left mainstem bronchus and a loculated left pleural effusion.  On May 30 she was moved to the ICU for chest tube placement and bronchoscopy.   Past Medical History  Status post mitral valve repair 2001 Status post cardiac pacemaker 2001 History of sick sinus syndrome Hypertension Diabetes mellitus type 2 Chronic atrial fibrillation Right MCA stroke 2021  Significant Hospital Events   4/16 IR thrombectomy R MCA stroke 4/25 PCCM sign off 5/6 transfer to [facility] Inpatient Rehab 5/29 PCCM consult left pleural effusion, thoracentesis 5/30 worsening tachypnea. Move to ICU for intubation to facilitate bronchoscopy and chest tube placement 5/31 TPA/ pulmonzyme started for 3 days for persistent left effusion 6/1 Afebrile, on low-dose Levophed, sedated on low-dose Precedex, Minimal output on chest tube- pulmozyme and tpa x 2, urine output picking up 6/2 Weaned x 7 hours yesterday PSV 15/5, pulmozyme and TPA completed 3 days 6/3 afib with RVR placed on amio gtt overnight , 1U PRBC for Hb 7.1  6/5 extubated successfully, Repeat TPA/Pulmonzyme given again  6/6 acute decompensation overnight, hemorrhagic shock > to OR for left VATS with 2L hemothorax drained.  CT head with new ICH>  neurosurgery consulted  Consults:  PCCM  Procedures:  5/29 Thoracentesis >> 1.5 L bloody fluid 5/30 ETT >  5/30 L pigtail chest tube >  Bronchoscopy 5/30 >>  large, thick, grey mucus plug completely occluding the left mainstem bronchus.   Significant Diagnostic Tests:  5/29 Thoracentesis left chest >  8K WBC, 78% WBC, protein 4.0, LDH 182  CT chest abdomen pelvis 6/6 > large left hemithorax, patchy right lung opacities with small right effusion.  No acute abdominal process  CT head 6/6 > evolution of right MCA infarct with hemorrhagic transformation with large intraparenchymal hematoma, 2 additional small hemorrhages in the inferior cerebellum and left temporal lobe.  Trace subarachnoid blood.  Micro Data:  RVP 4/16 neg  MRSA PCR  4/17 neg   ======== 5/29 Left pleural fluid culture > negative 5/29 left pleural fluid cytology >> - No malignant cells identified  - Blood and acute inflammation.  5/30 BAL > ng  Antimicrobials:  4/24 cefepime > 4/25 4/28 zosyn > 5/4 5/30 zosyn >   Interim history/subjective:  No acute events. Remains encephalopathic.  Unable to follow commands.  When sedation is weaned, she bites on ETT.  Objective   Blood pressure (!) 118/59, pulse (!) 107, temperature 97.7 F (36.5 C), temperature source Core, resp. rate 20, height _0  (1.626 m), weight 77 kg, SpO2 99 %. CVP:  [2 mmHg-24 mmHg] 22 mmHg  Vent Mode: CPAP;PSV FiO2 (%):  [30 %] 30 % Set Rate:  [20 bmp] 20 bmp Vt Set:  [430 mL] 430 mL PEEP:  [5 cmH20] 5 cmH20 Pressure Support:  [8 cmH20] 8 cmH20 Plateau Pressure:  [  12 cmH20-17 cmH20] 12 cmH20   Intake/Output Summary (Last 24 hours) at 12/23/2019 1121 Last data filed at 12/23/2019 1100 Gross per 24 hour  Intake 5109.06 ml  Output 600 ml  Net 4509.06 ml   Filed Weights   12/20/19 0500 12/22/19 0452 12/23/19 0351  Weight: 67.8 kg 74 kg 77 kg    Examination: General: Chronically ill appearing elderly female on mechanical ventilation,  in NAD HEENT: ETT, MM pink/moist, Sclera nonicteric Neuro: Somnolent but opens eyes to noxious stimuli, does not follow commands CV: s1s2 regular rate and rhythm, no murmur, rubs, or gallops.   PULM: Diminished bilaterally, left chest tube in place with dark bloody drainage seen in chamber GI: soft, bowel sounds active in all 4 quadrants, non-tender, non-distended Extremities: warm/dry, no edema  Skin: no rashes or lesions   Assessment & Plan:   Hemorrhagic shock - 2/2 hemothorax.  Resolved s/p multiple transfusions and OR 6/6 for VATS. P: Continue to follow H/H and chest tube output, seems to have stabilized. Continue supportive care.  Acute respiratory failure with hypoxemia Mucus plugging seen on bronchoscopy > likely aspiration Left pleural effusion -S/P Thoracentesis with later placement of  L pigtail chest tube  P: Continue ventilator support with lung protective strategies. Not weaning yet due to mental status.  She might require a few days to allow for neuro recovery and if not much change and husband continues to desire aggressive care, could consider trach. Continue abx and follow cultures. Continue chest tube per TCTS. Follow CXR.  AKI - presumed 2/2 shock.  No evidence of obstruction on renal US. Hypernatremia. P: Avoid nephrotoxins, ensure adequate renal perfusion. Continue D51/2 NS (dropped from 100 to 50) and free water per tube. Follow renal function / urine output.  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  Evaluted by neurosurgery who felt that CTs were stable and no surgical evacuation or EVD was warranted. P: Continue to follow right skull flap, ensure does not tighten.  No interventions planned by neurosurgery. Might need MRI vs EEG after pro-thrombotics were given to stabilize bleed. Continue neuro protective measures Supportive care PT/OT/SLP when able  DM2 with  hyperglycemia. P: Continue SSI, add tube feed coverage CBG q4hrs SLP eval  Chronic atrial fibrillation/s/p pacemaker P: Continuous telemetry Continue to hold Eliquis Continue amiodarone   Protein calorie malnutrition  P: Continue TF  Best practice:  Diet: TFs Pain/Anxiety/Delirium protocol (if indicated): precedex, prn fentanyl, prn versed RASS target 0 to -1 VAP protocol (if indicated): yes DVT prophylaxis: SCDs GI prophylaxis: PPI Glucose control: SSI Mobility: bed rest Code Status: full Family Communication: Husband updated by Dr. Vaughan Browner 6/7. Disposition:  ICU  CC time: 35 min.   Montey Hora, [facility] 12/23/2019, 11:21 AM

## 2019-12-23 NOTE — Progress Notes (Signed)
OT Cancellation Note  Patient Details Name: ANJANNETTE GAUGER MRN: [id number] DOB: 1956/04/26   Cancelled Treatment:    Reason Eval/Treat Not Completed: Patient not medically ready; Per RN, pt currently unresponsive (suspect worsening hemorrhages). Will follow up as able.  Lou Cal, OT Acute Rehabilitation Services Pager [phone] Office [phone]   Raymondo Band 12/23/2019, 9:23 AM

## 2019-12-23 NOTE — Progress Notes (Signed)
With mouth care and repositioning patient becomes agitated, opening eyes and hitting the bed with her right hand repeatedly. Will not follow commands, increased respiratory rate up to 47/min and biting ET tube. PRN fentanyl bolus given and fentanyl dose increased.

## 2019-12-23 NOTE — Progress Notes (Addendum)
2 Days Post-Op Procedure(s) (LRB): Video Assisted Thoracoscopy (Vats) left , Drainage of Hemothorax (Left) Video Bronchoscopy (N/A) Subjective: Sedated, mechanically ventilated.   Objective: Vital signs in last 24 hours: Temp:  [97.5 F (36.4 C)-98.6 F (37 C)] 97.7 F (36.5 C) (06/08 1100) Pulse Rate:  [45-120] 107 (06/08 1100) Cardiac Rhythm: Atrial fibrillation (06/08 0800) Resp:  [6-24] 20 (06/08 1100) BP: (104-179)/(59-122) 118/59 (06/08 1100) SpO2:  [96 %-99 %] 99 % (06/08 1100) Arterial Line BP: (129-183)/(59-75) 167/72 (06/08 0000) FiO2 (%):  [30 %] 30 % (06/08 0755) Weight:  [77 kg] 77 kg (06/08 0351)  Hemodynamic parameters for last 24 hours: CVP:  [2 mmHg-24 mmHg] 22 mmHg  Intake/Output from previous day: 06/07 0701 - 06/08 0700 In: 3476.6 [I.V.:3028.6; NG/GT:298; IV Piggyback:150] Out: 485 [Urine:305; Chest Tube:180] Intake/Output this shift: Total I/O In: 1826.2 [I.V.:676.2; NG/GT:1150] Out: 115 [Urine:75; Chest Tube:40]  Exam:: General appearance: Sedated and on ventilator.  Neurologic: Unable to assess. Heart: SR Lungs: Breath sounds are clear bilat. Chest tube drainage 131ml past 24 hours. No air leak from chest tubes/   Lab Results: Recent Labs    12/22/19 1647 12/23/19 0427  WBC 18.6* 19.7*  HGB 9.6* 9.1*  HCT 28.8* 27.4*  PLT 98* 103*   BMET:  Recent Labs    12/22/19 0339 12/22/19 0339 12/22/19 0348 12/23/19 0427  NA 146*   < > 147* 140  K 3.8   < > 3.7 3.6  CL 112*  --   --  104  CO2 20*  --   --  19*  GLUCOSE 131*  --   --  323*  BUN 68*  --   --  78*  CREATININE 3.25*  --   --  3.70*  CALCIUM 7.9*  --   --  8.0*   < > = values in this interval not displayed.    PT/INR:  Recent Labs    12/22/19 0339  LABPROT 13.3  INR 1.1   ABG    Component Value Date/Time   PHART 7.403 12/22/2019 0348   HCO3 22.8 12/22/2019 0348   TCO2 24 12/22/2019 0348   ACIDBASEDEF 2.0 12/22/2019 0348   O2SAT 99.0 12/22/2019 0348   CBG (last  3)  Recent Labs    12/23/19 0434 12/23/19 0812 12/23/19 1118  GLUCAP 308* 289* 297*    Assessment/Plan: S/P Procedure(s) (LRB): Video Assisted Thoracoscopy (Vats) left , Drainage of Hemothorax (Left) Video Bronchoscopy (N/A)  -POD2 Left VATS, drainage of hemothorax. CT drainage is tapering off and Hgb is stable. Gram stain and Cx's obtained form bronchial washings on the day of surgery show no organisms / no growth at 2 days. She is on cefepime. The CXR shows improved aeration on the left but increasing density over the right lower to mid lung zone. Will leave CT's in place for now. Dr. Darcey Nora will assess later.    LOS: 9 days    Antony Odea, [place] [phone] 12/23/2019 \ Patient slowly improving with decreasing chest tube output, stable hemoglobin and improving aeration of left lung on daily chest x-ray. We will leave chest tubes in place today.  Vent wean per critical care, her recent intracranial hemorrhage will impact on ventilator wean.  patient examined and medical record reviewed,agree with above note. Tharon Aquas Trigt III 12/23/2019

## 2019-12-24 ENCOUNTER — Inpatient Hospital Stay (HOSPITAL_COMMUNITY): Payer: Medicare Other

## 2019-12-24 LAB — BASIC METABOLIC PANEL
Anion gap: 13 (ref 5–15)
BUN: 87 mg/dL — ABNORMAL HIGH (ref 8–23)
CO2: 19 mmol/L — ABNORMAL LOW (ref 22–32)
Calcium: 7.3 mg/dL — ABNORMAL LOW (ref 8.9–10.3)
Chloride: 107 mmol/L (ref 98–111)
Creatinine, Ser: 3.58 mg/dL — ABNORMAL HIGH (ref 0.44–1.00)
GFR calc Af Amer: 15 mL/min — ABNORMAL LOW (ref >60)
GFR calc non Af Amer: 13 mL/min — ABNORMAL LOW (ref >60)
Glucose, Bld: 224 mg/dL — ABNORMAL HIGH (ref 70–99)
Potassium: 3.3 mmol/L — ABNORMAL LOW (ref 3.5–5.1)
Sodium: 139 mmol/L (ref 135–145)

## 2019-12-24 LAB — MAGNESIUM: Magnesium: 2.3 mg/dL (ref 1.7–2.4)

## 2019-12-24 LAB — CBC
HCT: 25.5 % — ABNORMAL LOW (ref 36.0–46.0)
HCT: 29.7 % — ABNORMAL LOW (ref 36.0–46.0)
Hemoglobin: 8.5 g/dL — ABNORMAL LOW (ref 12.0–15.0)
Hemoglobin: 9.6 g/dL — ABNORMAL LOW (ref 12.0–15.0)
MCH: 29.9 pg (ref 26.0–34.0)
MCH: 29.4 pg (ref 26.0–34.0)
MCHC: 33.3 g/dL (ref 30.0–36.0)
MCHC: 32.3 g/dL (ref 30.0–36.0)
MCV: 89.8 fL (ref 80.0–100.0)
MCV: 90.8 fL (ref 80.0–100.0)
Platelets: 103 10*3/uL — ABNORMAL LOW (ref 150–400)
Platelets: 123 10*3/uL — ABNORMAL LOW (ref 150–400)
RBC: 2.84 MIL/uL — ABNORMAL LOW (ref 3.87–5.11)
RBC: 3.27 MIL/uL — ABNORMAL LOW (ref 3.87–5.11)
RDW: 16.4 % — ABNORMAL HIGH (ref 11.5–15.5)
RDW: 16.6 % — ABNORMAL HIGH (ref 11.5–15.5)
WBC: 17.3 10*3/uL — ABNORMAL HIGH (ref 4.0–10.5)
WBC: 22.9 10*3/uL — ABNORMAL HIGH (ref 4.0–10.5)
nRBC: 0.2 % (ref 0.0–0.2)
nRBC: 0.3 % — ABNORMAL HIGH (ref 0.0–0.2)

## 2019-12-24 LAB — GLUCOSE, CAPILLARY
Glucose-Capillary: 207 mg/dL — ABNORMAL HIGH (ref 70–99)
Glucose-Capillary: 232 mg/dL — ABNORMAL HIGH (ref 70–99)
Glucose-Capillary: 202 mg/dL — ABNORMAL HIGH (ref 70–99)
Glucose-Capillary: 170 mg/dL — ABNORMAL HIGH (ref 70–99)
Glucose-Capillary: 177 mg/dL — ABNORMAL HIGH (ref 70–99)

## 2019-12-24 LAB — PHOSPHORUS: Phosphorus: 6.1 mg/dL — ABNORMAL HIGH (ref 2.5–4.6)

## 2019-12-24 MED ORDER — FUROSEMIDE 10 MG/ML IJ SOLN
40.0000 mg | Freq: Two times a day (BID) | INTRAMUSCULAR | Status: AC
  Administered 2019-12-24 – 2019-12-25 (×2): 40 mg via INTRAVENOUS
  Filled 2019-12-24 (×2): qty 4

## 2019-12-24 MED ORDER — POTASSIUM CHLORIDE 10 MEQ/50ML IV SOLN
10.0000 meq | INTRAVENOUS | Status: AC
  Administered 2019-12-24 (×2): 10 meq via INTRAVENOUS
  Filled 2019-12-24 (×2): qty 50

## 2019-12-24 MED ORDER — FUROSEMIDE 10 MG/ML IJ SOLN
40.0000 mg | Freq: Once | INTRAMUSCULAR | Status: AC
  Administered 2019-12-24: 40 mg via INTRAVENOUS
  Filled 2019-12-24: qty 4

## 2019-12-24 MED ORDER — INSULIN ASPART 100 UNIT/ML ~~LOC~~ SOLN
4.0000 [IU] | SUBCUTANEOUS | Status: DC
  Administered 2019-12-24 – 2019-12-26 (×12): 4 [IU] via SUBCUTANEOUS

## 2019-12-24 MED ORDER — POTASSIUM CHLORIDE 10 MEQ/50ML IV SOLN: 10.0000 meq | INTRAVENOUS | Status: DC

## 2019-12-24 NOTE — Progress Notes (Signed)
Dr. Oletta Darter called and discussed minimal urine output of 150 cc overnight along with elevated creatinine and K+ 3.3 per morning labs. MD stated he would order some IV K+ replacement.

## 2019-12-24 NOTE — Progress Notes (Signed)
OT Cancellation Note  Patient Details Name: CLOTHILDE TIPPETTS MRN: [id number] DOB: 1955-09-11   Cancelled Treatment:    Reason Eval/Treat Not Completed: Medical issues which prohibited therapy; will sign off at this time given ongoing medical issues. Please reorder PT/OT Evaluations when pt appropriate to participate.  Lou Cal, OT Acute Rehabilitation Services Pager [phone] Office [phone]  Raymondo Band 12/24/2019, 12:22 PM

## 2019-12-24 NOTE — Progress Notes (Signed)
[facility] Progress Note Patient Name: Krystal Little DOB: March 07, 1956 MRN: [id number]   Date of Service  12/24/2019  HPI/Events of Note  Hypokalemia - K+ = 3.3 and Creatinine = 3.58.   eICU Interventions  Plan: 1. Cautiously replace K+.     Intervention Category Major Interventions: Electrolyte abnormality - evaluation and management  Angelic Schnelle [place] 12/24/2019, 5:52 AM

## 2019-12-24 NOTE — Progress Notes (Signed)
NAME:  Krystal Little, MRN:  [id number], DOB:  01/12/1956, LOS: 10 ADMISSION DATE:  11/24/2019, CONSULTATION DATE:  5/29 REFERRING MD:  Letta Pate, CHIEF COMPLAINT:  Chest congestion   Brief History   54 female with an extensive cardiac history was admitted to [facility] in April in the setting of a right MCA stroke, had a right interventional radiology guided thrombectomy, later had significant brain edema and underwent craniectomy, ultimately transferred to inpatient rehab.  Pulmonary and critical care medicine was consulted on May 29 in the setting of tachypnea and an enlarging left-sided pleural effusion. Thoracentesis was performed which showed a hemothorax.  A CT chest was then ordered showing obstruction of her left mainstem bronchus and a loculated left pleural effusion.  On May 30 she was moved to the ICU for chest tube placement and bronchoscopy.   Past Medical History  Status post mitral valve repair 2001 Status post cardiac pacemaker 2001 History of sick sinus syndrome Hypertension Diabetes mellitus type 2 Chronic atrial fibrillation Right MCA stroke 2021  Significant Hospital Events   4/16 IR thrombectomy R MCA stroke 4/25 PCCM sign off 5/6 transfer to [facility] Inpatient Rehab 5/29 PCCM consult left pleural effusion, thoracentesis 5/30 worsening tachypnea. Move to ICU for intubation to facilitate bronchoscopy and chest tube placement 5/31 TPA/ pulmonzyme started for 3 days for persistent left effusion 6/1 Afebrile, on low-dose Levophed, sedated on low-dose Precedex, Minimal output on chest tube- pulmozyme and tpa x 2, urine output picking up 6/2 Weaned x 7 hours yesterday PSV 15/5, pulmozyme and TPA completed 3 days 6/3 afib with RVR placed on amio gtt overnight , 1U PRBC for Hb 7.1  6/5 extubated successfully, Repeat TPA/Pulmonzyme given again  6/6 acute decompensation overnight, hemorrhagic shock > to OR for left VATS with 2L hemothorax drained.  CT head with new ICH>  neurosurgery consulted 6/9 More awake but agitated on PSV weaning   Consults:  PCCM  Procedures:  5/29 Thoracentesis >> 1.5 L bloody fluid 5/30 ETT >  5/30 L pigtail chest tube >  Bronchoscopy 5/30 >>  large, thick, grey mucus plug completely occluding the left mainstem bronchus.   Significant Diagnostic Tests:  5/29 Thoracentesis left chest >  8K WBC, 78% WBC, protein 4.0, LDH 182  CT chest abdomen pelvis 6/6 > large left hemithorax, patchy right lung opacities with small right effusion.  No acute abdominal process  CT head 6/6 > evolution of right MCA infarct with hemorrhagic transformation with large intraparenchymal hematoma, 2 additional small hemorrhages in the inferior cerebellum and left temporal lobe.  Trace subarachnoid blood.  Micro Data:  RVP 4/16 neg  MRSA PCR  4/17 neg   ======== 5/29 Left pleural fluid culture > negative 5/29 left pleural fluid cytology >> - No malignant cells identified  - Blood and acute inflammation.  5/30 BAL > ng  Antimicrobials:  4/24 cefepime > 4/25 4/28 zosyn > 5/4 5/30 zosyn >   Interim history/subjective:  Agitated on PSV weans, moving right extremity.  Does not follow commands.  Objective   Blood pressure (!) 165/125, pulse (!) 130, temperature (!) 97.5 F (36.4 C), resp. rate (!) 31, height _0  (1.626 m), weight 82.4 kg, SpO2 92 %. CVP:  [2 mmHg-22 mmHg] 20 mmHg  Vent Mode: PRVC FiO2 (%):  [30 %] 30 % Set Rate:  [20 bmp] 20 bmp Vt Set:  [430 mL] 430 mL PEEP:  [5 cmH20] 5 cmH20 Plateau Pressure:  [18 cmH20-25 cmH20] 25 cmH20  Intake/Output Summary (Last 24 hours) at 12/24/2019 0840 Last data filed at 12/24/2019 0700 Gross per 24 hour  Intake 4266.47 ml  Output 954 ml  Net 3312.47 ml   Filed Weights   12/22/19 0452 12/23/19 0351 12/24/19 0500  Weight: 74 kg 77 kg 82.4 kg    Examination: Gen:      No acute distress HEENT:  EOMI, sclera anicteric, ETT Neck:     No masses; no thyromegaly Lungs:    Clear to  auscultation bilaterally; normal respiratory effort CV:         Regular rate and rhythm; no murmurs Abd:      + bowel sounds; soft, non-tender; no palpable masses, no distension Ext:    No edema; adequate peripheral perfusion Skin:      Warm and dry; no rash Neuro: Awake, non purposeful  Lab significant for Potassium 3.3 [repleted], glucose 224, creatinine 3.58 WBC 17.3, hemoglobin 8.5, platelets 103 Chest x-ray 6/9-stable bilateral airspace opacities, support apparatus and chest tube in stable position  Assessment & Plan:   Hemorrhagic shock - 2/2 hemothorax.  Resolved s/p multiple transfusions and OR 6/6 for VATS. P: Continue to follow H/H and chest tube output, seems to have stabilized. Continue supportive care.  Acute respiratory failure with hypoxemia Mucus plugging seen on bronchoscopy > likely aspiration Left pleural effusion -S/P Thoracentesis with later placement of  L pigtail chest tube  P: Continue ventilator support with lung protective strategies. Pressure support weans as tolerated.  She might require a few days to allow for neuro recovery and if not much change and husband continues to desire aggressive care, could consider trach. Continue abx and follow cultures. Continue chest tube per TCTS. Follow CXR.  AKI - presumed 2/2 shock.  No evidence of obstruction on renal US. Hypernatremia. P: Avoid nephrotoxins, ensure adequate renal perfusion. Continue D51/2 NS (dropped from 100 to 50) and free water per tube. Follow renal function / urine output.  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  Evaluted by neurosurgery who felt that CTs were stable and no surgical evacuation or EVD was warranted. P: Continue to follow right skull flap, ensure does not tighten.  No interventions planned by neurosurgery. Might need MRI vs EEG after pro-thrombotics were given to stabilize bleed. Continue  neuro protective measures Supportive care PT/OT/SLP when able  DM2 with hyperglycemia. P: Continue SSI, add tube feed coverage CBG q4hrs SLP eval  Chronic atrial fibrillation/s/p pacemaker P: Continuous telemetry Continue to hold Eliquis Continue amiodarone   Protein calorie malnutrition  P: Continue TF  Best practice:  Diet: TFs Pain/Anxiety/Delirium protocol (if indicated): precedex, prn fentanyl, prn versed RASS target 0 to -1 VAP protocol (if indicated): yes DVT prophylaxis: SCDs GI prophylaxis: PPI Glucose control: SSI Mobility: bed rest Code Status: full Family Communication: Pending.  Husband last updated 6/8 Disposition:  ICU  The patient is critically ill with multiple organ system failure and requires high complexity decision making for assessment and support, frequent evaluation and titration of therapies, advanced monitoring, review of radiographic studies and interpretation of complex data.   Critical Care Time devoted to patient care services, exclusive of separately billable procedures, described in this note is 35 minutes.   Marshell Garfinkel MD [place] [facility] Please see [email or web address] for pager details.  12/24/2019, 8:46 AM

## 2019-12-24 NOTE — Progress Notes (Signed)
Called Krystal Little and spoke with Ivin Booty, RN and made her aware of K+ 3.3 and let her know that patient's urine output overnight has totaled 150 cc. Creatinine has been elevated.  Ivin Booty, RN to make Dr. Oletta Darter aware.

## 2019-12-24 NOTE — Progress Notes (Addendum)
      [address]       [place] [address]             [phone]      3 Days Post-Op Procedure(s) (LRB): Video Assisted Thoracoscopy (Vats) left , Drainage of Hemothorax (Left) Video Bronchoscopy (N/A) Subjective: Intubated and sedated. Responses to voice.   Objective: Vital signs in last 24 hours: Temp:  [97 F (36.1 C)-98.1 F (36.7 C)] 97.5 F (36.4 C) (06/09 0836) Pulse Rate:  [26-130] 130 (06/09 0836) Cardiac Rhythm: Atrial fibrillation (06/09 0400) Resp:  [6-31] 31 (06/09 0836) BP: (89-165)/(50-125) 165/125 (06/09 0836) SpO2:  [92 %-100 %] 92 % (06/09 0836) FiO2 (%):  [30 %] 30 % (06/09 0836) Weight:  [82.4 kg] 82.4 kg (06/09 0500)  Hemodynamic parameters for last 24 hours: CVP:  [2 mmHg-22 mmHg] 20 mmHg  Intake/Output from previous day: 06/08 0701 - 06/09 0700 In: 5496.7 [I.V.:2459.3; NG/GT:2890; IV Piggyback:147.4] Out: 979 [Urine:484; Stool:325; Chest Tube:170] Intake/Output this shift: No intake/output data recorded.  General appearance: sedated on vent Heart: irregularly irregular rhythm, intermittent pacing Lungs: clear to auscultation bilaterally Extremities: 1+ pitting edema in lower ext Neuro: unable to access, responsive to voice with opening of eyes.   Lab Results: Recent Labs    12/23/19 1730 12/24/19 0343  WBC 16.8* 17.3*  HGB 8.5* 8.5*  HCT 25.5* 25.5*  PLT 98* 103*   BMET:  Recent Labs    12/23/19 0427 12/24/19 0343  NA 140 139  K 3.6 3.3*  CL 104 107  CO2 19* 19*  GLUCOSE 323* 224*  BUN 78* 87*  CREATININE 3.70* 3.58*  CALCIUM 8.0* 7.3*    PT/INR:  Recent Labs    12/22/19 0339  LABPROT 13.3  INR 1.1   ABG    Component Value Date/Time   PHART 7.403 12/22/2019 0348   HCO3 22.8 12/22/2019 0348   TCO2 24 12/22/2019 0348   ACIDBASEDEF 2.0 12/22/2019 0348   O2SAT 99.0 12/22/2019 0348   CBG (last 3)  Recent Labs    12/23/19 2353 12/24/19 0336 12/24/19 0721  GLUCAP 245* 207* 232*     Assessment/Plan: S/P Procedure(s) (LRB): Video Assisted Thoracoscopy (Vats) left , Drainage of Hemothorax (Left) Video Bronchoscopy (N/A)  1. POD3 Left VATs, drainage of hemothorax. Chest tubes put out 170cc/24 hours.  2. bronchial washings on the day of surgery show no organisms / no growth at 2 days. Continue cefepime 3. H and H 8.5/25.5, expected acute blood loss anemia 4. Renal-AKI, [place] ordered this morning for hypokalemia 5. Uncontrolled diabetes mellitus-blood glucose > 200. Defer to medicine.   Plan: Continue chest tube until bone dry. Will defer removal to Dr. Prescott Gum. With her recent intracranial hemorrhage-continuing neuro protective measure per CCM.    LOS: 10 days    Elgie Collard 12/24/2019  Chest tube output remains minimal now, without air leak, and hemoglobin stable. Aeration of left lung continues to improve with daily chest x-ray. Ventilator wean per critical care.  patient examined and medical record reviewed,agree with above note. Tharon Aquas Trigt III 12/24/2019

## 2019-12-24 NOTE — Progress Notes (Signed)
PT Cancellation Note  Patient Details Name: Krystal Little MRN: [id number] DOB: 1955/09/25   Cancelled Treatment:    Reason Eval/Treat Not Completed: Medical issues which prohibited therapy, will sign off. Please reorder PT/OT Evaluations when pt appropriate to participate.  Mabeline Caras, PT, DPT Acute Rehabilitation Services  Pager [phone] Office [place] 12/24/2019, 9:06 AM

## 2019-12-25 ENCOUNTER — Inpatient Hospital Stay (HOSPITAL_COMMUNITY): Payer: Medicare Other

## 2019-12-25 LAB — COMPREHENSIVE METABOLIC PANEL
ALT: 52 U/L — ABNORMAL HIGH (ref 0–44)
AST: 71 U/L — ABNORMAL HIGH (ref 15–41)
Albumin: 1.7 g/dL — ABNORMAL LOW (ref 3.5–5.0)
Alkaline Phosphatase: 129 U/L — ABNORMAL HIGH (ref 38–126)
Anion gap: 15 (ref 5–15)
BUN: 96 mg/dL — ABNORMAL HIGH (ref 8–23)
CO2: 17 mmol/L — ABNORMAL LOW (ref 22–32)
Calcium: 7.6 mg/dL — ABNORMAL LOW (ref 8.9–10.3)
Chloride: 102 mmol/L (ref 98–111)
Creatinine, Ser: 3.93 mg/dL — ABNORMAL HIGH (ref 0.44–1.00)
GFR calc Af Amer: 13 mL/min — ABNORMAL LOW (ref >60)
GFR calc non Af Amer: 11 mL/min — ABNORMAL LOW (ref >60)
Glucose, Bld: 201 mg/dL — ABNORMAL HIGH (ref 70–99)
Potassium: 3.7 mmol/L (ref 3.5–5.1)
Sodium: 134 mmol/L — ABNORMAL LOW (ref 135–145)
Total Bilirubin: 0.7 mg/dL (ref 0.3–1.2)
Total Protein: 6 g/dL — ABNORMAL LOW (ref 6.5–8.1)

## 2019-12-25 LAB — BPAM RBC
Blood Product Expiration Date: 202107062359
Blood Product Expiration Date: 202107062359
Blood Product Expiration Date: 202107072359
Blood Product Expiration Date: 202107072359
Blood Product Expiration Date: 202107092359
Blood Product Expiration Date: 202107092359
Blood Product Expiration Date: 202107092359
Blood Product Expiration Date: 202107092359
Blood Product Expiration Date: 202107102359
Blood Product Expiration Date: 202107102359
ISSUE DATE / TIME: 202106021026
ISSUE DATE / TIME: 202106031359
ISSUE DATE / TIME: 202106060938
ISSUE DATE / TIME: 202106060938
ISSUE DATE / TIME: 202106061307
ISSUE DATE / TIME: 202106061307
ISSUE DATE / TIME: 202106061307
ISSUE DATE / TIME: 202106071851
Unit Type and Rh: 5100
Unit Type and Rh: 5100
Unit Type and Rh: 5100
Unit Type and Rh: 5100
Unit Type and Rh: 5100
Unit Type and Rh: 5100
Unit Type and Rh: 5100
Unit Type and Rh: 5100
Unit Type and Rh: 5100
Unit Type and Rh: 5100

## 2019-12-25 LAB — TYPE AND SCREEN
ABO/RH(D): O POS
Antibody Screen: NEGATIVE
Unit division: 0
Unit division: 0
Unit division: 0
Unit division: 0
Unit division: 0
Unit division: 0
Unit division: 0
Unit division: 0
Unit division: 0
Unit division: 0

## 2019-12-25 LAB — BODY FLUID CULTURE: Culture: NO GROWTH

## 2019-12-25 LAB — CBC
HCT: 24.2 % — ABNORMAL LOW (ref 36.0–46.0)
Hemoglobin: 7.9 g/dL — ABNORMAL LOW (ref 12.0–15.0)
MCH: 29.8 pg (ref 26.0–34.0)
MCHC: 32.6 g/dL (ref 30.0–36.0)
MCV: 91.3 fL (ref 80.0–100.0)
Platelets: 101 10*3/uL — ABNORMAL LOW (ref 150–400)
RBC: 2.65 MIL/uL — ABNORMAL LOW (ref 3.87–5.11)
RDW: 16.5 % — ABNORMAL HIGH (ref 11.5–15.5)
WBC: 18.5 10*3/uL — ABNORMAL HIGH (ref 4.0–10.5)
nRBC: 0.2 % (ref 0.0–0.2)

## 2019-12-25 LAB — GLUCOSE, CAPILLARY
Glucose-Capillary: 177 mg/dL — ABNORMAL HIGH (ref 70–99)
Glucose-Capillary: 181 mg/dL — ABNORMAL HIGH (ref 70–99)
Glucose-Capillary: 224 mg/dL — ABNORMAL HIGH (ref 70–99)
Glucose-Capillary: 245 mg/dL — ABNORMAL HIGH (ref 70–99)
Glucose-Capillary: 270 mg/dL — ABNORMAL HIGH (ref 70–99)
Glucose-Capillary: 300 mg/dL — ABNORMAL HIGH (ref 70–99)
Glucose-Capillary: 325 mg/dL — ABNORMAL HIGH (ref 70–99)

## 2019-12-25 LAB — CULTURE, RESPIRATORY W GRAM STAIN: Special Requests: NORMAL

## 2019-12-25 LAB — PHOSPHORUS: Phosphorus: 7.2 mg/dL — ABNORMAL HIGH (ref 2.5–4.6)

## 2019-12-25 LAB — MAGNESIUM: Magnesium: 2.5 mg/dL — ABNORMAL HIGH (ref 1.7–2.4)

## 2019-12-25 MED ORDER — FUROSEMIDE 10 MG/ML IJ SOLN
160.0000 mg | Freq: Four times a day (QID) | INTRAVENOUS | Status: DC
  Administered 2019-12-25 – 2019-12-27 (×3): 160 mg via INTRAVENOUS
  Filled 2019-12-25 (×2): qty 16
  Filled 2019-12-25: qty 10
  Filled 2019-12-25 (×4): qty 16
  Filled 2019-12-25: qty 10
  Filled 2019-12-25: qty 16

## 2019-12-25 MED ORDER — FUROSEMIDE 10 MG/ML IJ SOLN
120.0000 mg | Freq: Four times a day (QID) | INTRAVENOUS | Status: DC
  Administered 2019-12-25: 120 mg via INTRAVENOUS
  Filled 2019-12-25: qty 10
  Filled 2019-12-25 (×3): qty 12

## 2019-12-25 MED ORDER — DEXMEDETOMIDINE HCL IN NACL 400 MCG/100ML IV SOLN
0.0000 ug/kg/h | INTRAVENOUS | Status: AC
  Administered 2019-12-25 (×3): 0.7 ug/kg/h via INTRAVENOUS
  Administered 2019-12-26 (×2): 0.8 ug/kg/h via INTRAVENOUS
  Administered 2019-12-26 – 2019-12-27 (×3): 0.6 ug/kg/h via INTRAVENOUS
  Administered 2019-12-28: 0.5 ug/kg/h via INTRAVENOUS
  Administered 2019-12-29: 0.6 ug/kg/h via INTRAVENOUS
  Administered 2019-12-29: 0.7 ug/kg/h via INTRAVENOUS
  Administered 2019-12-29: 0.5 ug/kg/h via INTRAVENOUS
  Administered 2019-12-30: 0.6 ug/kg/h via INTRAVENOUS
  Filled 2019-12-25 (×16): qty 100

## 2019-12-25 MED ORDER — SODIUM CHLORIDE 0.9 % IV SOLN: INTRAVENOUS | Status: DC | PRN

## 2019-12-25 MED ORDER — SODIUM CHLORIDE 0.9 % IV SOLN
1.0000 g | INTRAVENOUS | Status: DC
  Administered 2019-12-26: 1 g via INTRAVENOUS
  Filled 2019-12-25: qty 1

## 2019-12-25 MED FILL — Heparin Sodium (Porcine) Inj 1000 Unit/ML: INTRAMUSCULAR | Qty: 30 | Status: AC

## 2019-12-25 NOTE — Consult Note (Signed)
Reason for Consult: AKI Referring Physician: Vaughan Browner, MD  Krystal Little is an 64 y.o. 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  Compllicated by worsening cerebral edema with mass effect s/p decompressive right hemicraniectomy placement of bone flap on 11/02/19.  She had slow improvement with ongoing debility and was admitted to CIR on 11/20/19 for rehab, however on 12/13/19 she developed acute respiratory distress.  PCCM consulted and CXR with enlarging left sided pleural effusion.  Thoracentesis performed showing hemothorax.  She was transferred to ICU and chest tube bronchoscopy for obstruction of left mainstem bronchus.  She was intubated and loculated effusion treated with TPA/pulmozyme which was complicated by hemorrhagic shock and need for pressors.  She underwent VATS on 01/04/2020 after her Hgb dropped to 4 g.  We were consulted to further evaluate and manage the development of oliguric AKI.  The trend in Scr is seen below.   Trend in Creatinine: Creatinine, Ser  Date/Time Value Ref Range Status  12/25/2019 04:56 AM 3.93 (H) 0.44 - 1.00 mg/dL Final  12/24/2019 03:43 AM 3.58 (H) 0.44 - 1.00 mg/dL Final  12/23/2019 04:27 AM 3.70 (H) 0.44 - 1.00 mg/dL Final  12/22/2019 03:39 AM 3.25 (H) 0.44 - 1.00 mg/dL Final  12/16/2019 06:56 PM 3.25 (H) 0.44 - 1.00 mg/dL Final  01/08/2020 03:44 AM 2.86 (H) 0.44 - 1.00 mg/dL Final  12/20/2019 05:32 AM 2.51 (H) 0.44 - 1.00 mg/dL Final  12/19/2019 04:49 AM 2.45 (H) 0.44 - 1.00 mg/dL Final  12/18/2019 04:49 AM 2.03 (H) 0.44 - 1.00 mg/dL Final  12/17/2019 04:58 AM 1.20 (H) 0.44 - 1.00 mg/dL Final  12/16/2019 03:23 AM 1.21 (H) 0.44 - 1.00 mg/dL Final  12/15/2019 02:21 AM 1.82 (H) 0.44 - 1.00 mg/dL Final  11/17/2019 01:06 PM 1.89 (H) 0.44 -  1.00 mg/dL Final  12/12/2019 07:26 AM 0.58 0.44 - 1.00 mg/dL Final  12/08/2019 06:20 AM 0.58 0.44 - 1.00 mg/dL Final  12/05/2019 07:57 AM 0.71 0.44 - 1.00 mg/dL Final  11/24/2019 05:55 AM 0.52 0.44 - 1.00 mg/dL Final  11/21/2019 06:58 AM 0.53 0.44 - 1.00 mg/dL Final  11/20/2019 07:30 PM 0.65 0.44 - 1.00 mg/dL Final  11/19/2019 05:00 AM 0.65 0.44 - 1.00 mg/dL Final  11/18/2019 05:00 AM 0.56 0.44 - 1.00 mg/dL Final  11/17/2019 05:00 AM 0.65 0.44 - 1.00 mg/dL Final  11/16/2019 04:20 AM 0.59 0.44 - 1.00 mg/dL Final  11/15/2019 05:20 AM 0.63 0.44 - 1.00 mg/dL Final  11/12/2019 04:45 AM 0.89 0.44 - 1.00 mg/dL Final  11/11/2019 04:54 AM 0.86 0.44 - 1.00 mg/dL Final  11/10/2019 04:57 AM 0.94 0.44 - 1.00 mg/dL Final  11/09/2019 03:41 AM 0.80 0.44 - 1.00 mg/dL Final  11/08/2019 05:50 AM 0.61 0.44 - 1.00 mg/dL Final  11/07/2019 06:05 AM 0.53 0.44 - 1.00 mg/dL Final  11/06/2019 04:00 AM 0.65 0.44 - 1.00 mg/dL Final  11/05/2019 03:24 AM 0.95 0.44 - 1.00 mg/dL Final  11/04/2019 09:38 AM 0.83 0.44 - 1.00 mg/dL Final  11/04/2019 04:00 AM 0.85 0.44 - 1.00 mg/dL Final  11/03/2019 04:08 AM 0.70 0.44 - 1.00 mg/dL Final  11/02/2019 03:43 AM 0.58 0.44 - 1.00 mg/dL Final  11/01/2019 09:45 AM 0.72 0.44 - 1.00 mg/dL Final  10/31/2019 06:05 PM 0.50 0.44 - 1.00 mg/dL Final  10/31/2019 05:44 PM 0.69 0.44 -  1.00 mg/dL Final  08/18/2019 11:20 AM 0.79 0.57 - 1.00 mg/dL Final  10/18/2017 10:31 AM 0.71 0.57 - 1.00 mg/dL Final  05/10/2010 10:16 PM 0.69 0.40 - 1.20 mg/dL Final  04/14/2009 09:30 PM 0.66 0.40 - 1.20 mg/dL Final  01/07/2009 09:36 PM 0.61 0.40 - 1.20 mg/dL Final  11/26/2008 06:15 AM 0.64 0.40 - 1.20 mg/dL Final  01/31/2007 08:36 PM 0.74 0.40 - 1.20 mg/dL Final    PMH:   Past Medical History:  Diagnosis Date  . Chronic atrial fibrillation ([place]) 1990s   s/p DCCV then attempted ablation of complex A Flutter (@ [facility] - Dr. Deno Etienne), failed antiarrhythmics --> INR folllwed @ [facility] ->> now status  post pacemaker placement with underlying A. fib.  . Diabetes mellitus type 2, uncontrolled, without complications    On Oral Medications ([facility])  . Essential hypertension   . History of cardiac catheterization 2001   R&LHC - normal Coronaries, no evidence of Restictive Cardiomyopathy or Constrictive Pericarditis (also @ [facility])  . Hx of sick sinus syndrome 07/1999   Wtih symptomatic bradycardia - syncope ([place])  . 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 S/P placement of cardiac pacemaker 07/1999    PSH:   Past Surgical History:  Procedure Laterality Date  . CARDIAC CATHETERIZATION  08/03/1999   [place] - normal Coronaries; no sign of constriction or restrictive cardiomypathy  . CRANIECTOMY FOR DEPRESSED SKULL FRACTURE Right 11/02/2019   Procedure: DECOMPRESSIVE HEMI -CRANIECTOMY, IMPLANTATION OF SKULL FLAP TO RIGHT ABDOMEN;  Surgeon: Consuella Lose, MD;  Location: [place];  Service: Neurosurgery;  Laterality: Right;  . IR CT HEAD LTD  10/31/2019  . IR PERCUTANEOUS ART THROMBECTOMY/INFUSION INTRACRANIAL INC DIAG ANGIO  10/31/2019      . IR PERCUTANEOUS ART THROMBECTOMY/INFUSION INTRACRANIAL INC DIAG ANGIO  10/31/2019  . MITRAL VALVE REPAIR  07/1999   Both Ant& Post leaflet repair - quadrangular resection&caudal transposition, #32 Sequin Annuloplasty ring  . PACEMAKER GENERATOR CHANGE  11/26/2008   Medtronic Adapta L(? if it has been checked since 02/2013)  . PACEMAKER INSERTION  07/1999   for SSS in setting of Chronic Afib; Medtronic Kappa [id number], [id number].  Marland Kitchen RADIOLOGY WITH ANESTHESIA N/A 10/31/2019   Procedure: IR WITH ANESTHESIA;  Surgeon: Radiologist, Medication,  MD;  Location: [place];  Service: Radiology;  Laterality: N/A;  . TRANSTHORACIC ECHOCARDIOGRAM  05/2018   Normal LV size and thickness.  EF estimated 50%.  Inferobasal HK and flattened septum c/w with elevated PA pressures. Mild functional mitral stenosis at rest (postoperative).  Moderate left atrial dilation and mild right atrial dilation.  Mean PA pressures estimated 52 mmHg.  Marland Kitchen VIDEO ASSISTED THORACOSCOPY (VATS)/THOROCOTOMY Left 01/10/2020   Procedure: Video Assisted Thoracoscopy (Vats) left , Drainage of Hemothorax;  Surgeon: Ivin Poot, MD;  Location: [place];  Service: Thoracic;  Laterality: Left;  Marland Kitchen VIDEO BRONCHOSCOPY N/A 12/30/2019   Procedure: Video Bronchoscopy;  Surgeon: Ivin Poot, MD;  Location: [facility] OR;  Service: Thoracic;  Laterality: N/A;    Allergies: No Known Allergies  Medications:   Prior to Admission medications   Medication Sig Start Date End Date Taking? Authorizing Provider  acetaminophen (TYLENOL) 500  MG tablet Take 500-1,000 mg by mouth every 6 (six) hours as needed for headache (pain).    [provider]  amantadine (SYMMETREL) 50 MG/5ML solution Place 10 mLs (100 mg total) into feeding tube 2 (two) times daily. 11/20/19   Donzetta Starch, NP  Amino Acids-Protein Hydrolys (FEEDING SUPPLEMENT, PRO-STAT SUGAR FREE 64,) LIQD Take 30 mLs by mouth 2 (two) times daily. 11/20/19   Donzetta Starch, NP  aspirin 325 MG tablet Place 1 tablet (325 mg total) into feeding tube daily. 11/21/19   Donzetta Starch, NP  atorvastatin (LIPITOR) 40 MG tablet Place 1 tablet (40 mg total) into feeding tube daily. 11/21/19   Donzetta Starch, NP  Chlorhexidine Gluconate Cloth 2 % PADS Apply 6 each topically daily at 6 (six) AM. 11/21/19   Donzetta Starch, NP  chlorhexidine gluconate, MEDLINE KIT, (PERIDEX) 0.12 % solution 15 mLs by Mouth Rinse route 2 (two) times daily. 11/20/19   Donzetta Starch, NP  diltiazem (CARDIZEM) 10 mg/ml oral suspension Place 6 mLs (60 mg total) into feeding tube every 6  (six) hours. 11/20/19   Donzetta Starch, NP  enoxaparin (LOVENOX) 40 MG/0.4ML injection Inject 0.4 mLs (40 mg total) into the skin daily. 11/20/19   Donzetta Starch, NP  insulin aspart (NOVOLOG) 100 UNIT/ML injection Inject 0-9 Units into the skin 3 (three) times daily with meals. 11/20/19   Donzetta Starch, NP  insulin aspart (NOVOLOG) 100 UNIT/ML injection Inject 8 Units into the skin 2 (two) times daily. 11/20/19   Donzetta Starch, NP  insulin glargine (LANTUS) 100 UNIT/ML injection Inject 0.2 mLs (20 Units total) into the skin 2 (two) times daily. 11/20/19   Donzetta Starch, NP  Maltodextrin-Xanthan Gum (RESOURCE THICKENUP CLEAR) POWD Take 120 g by mouth as needed (nectar thick liquids). 11/20/19   Donzetta Starch, NP  metoprolol tartrate (LOPRESSOR) 25 MG tablet Place 1 tablet (25 mg total) into feeding tube 2 (two) times daily. 11/20/19   Donzetta Starch, NP  Nutritional Supplements (FEEDING SUPPLEMENT, GLUCERNA 1.5 CAL,) LIQD Place 500 mLs into feeding tube daily. 11/20/19   Donzetta Starch, NP  polyethylene glycol (MIRALAX / GLYCOLAX) 17 g packet Place 17 g into feeding tube 2 (two) times daily. 11/20/19   Donzetta Starch, NP  senna-docusate (SENOKOT-S) 8.6-50 MG tablet Place 1 tablet into feeding tube at bedtime as needed for mild constipation. 11/20/19   Donzetta Starch, NP  senna-docusate (SENOKOT-S) 8.6-50 MG tablet Place 1 tablet into feeding tube 2 (two) times daily. 11/20/19   Donzetta Starch, NP    Inpatient medications: . amantadine  100 mg Per Tube BID  . aspirin  325 mg Per Tube Daily  . atorvastatin  40 mg Per Tube Daily  . chlorhexidine gluconate (MEDLINE KIT)  15 mL Mouth Rinse BID  . Chlorhexidine Gluconate Cloth  6 each Topical Q0600  . docusate  100 mg Per Tube BID  . feeding supplement (PRO-STAT SUGAR FREE 64)  30 mL Per Tube Daily  . fentaNYL (SUBLIMAZE) injection  25 mcg Intravenous Once  . free water  200 mL Per Tube Q4H  . insulin aspart  0-20 Units Subcutaneous Q4H  . insulin aspart  4 Units  Subcutaneous Q4H  . mouth rinse  15 mL Mouth Rinse 10 times per day  . pantoprazole (PROTONIX) IV  40 mg Intravenous Q24H  . polyethylene glycol  17 g Per Tube Daily  . senna-docusate  1 tablet  Per Tube BID  . sodium chloride flush  10-40 mL Intracatheter Q12H    Discontinued Meds:   Medications Discontinued During This Encounter  Medication Reason  . dexmedetomidine (PRECEDEX) bolus via infusion 61.3 mcg   . Chlorhexidine Gluconate Cloth 2 % PADS 6 each   . midazolam (VERSED) injection 1 mg   . fentaNYL (SUBLIMAZE) injection 50 mcg   . dexmedetomidine (PRECEDEX) 400 MCG/100ML (4 mcg/mL) infusion   . enoxaparin (LOVENOX) injection 40 mg   . polyethylene glycol (MIRALAX / GLYCOLAX) packet 17 g Duplicate  . insulin aspart (novoLOG) injection 0-9 Units   . feeding supplement (PRO-STAT SUGAR FREE 64) liquid 30 mL   . diltiazem (CARDIZEM) 10 mg/ml oral suspension 60 mg   . metoprolol tartrate (LOPRESSOR) tablet 25 mg   . enoxaparin (LOVENOX) injection 40 mg   . feeding supplement (GLUCERNA 1.5 CAL) liquid 500 mL   . feeding supplement (PRO-STAT SUGAR FREE 64) liquid 30 mL   . feeding supplement (VITAL HIGH PROTEIN) liquid 1,000 mL   . insulin aspart (novoLOG) injection 8 Units Duplicate  . insulin aspart (novoLOG) injection 0-9 Units   . insulin aspart (novoLOG) injection 5 Units   . alteplase (CATHFLO ACTIVASE) 10 mg in sodium chloride (PF) 0.9 % 30 mL   . dornase alpha (PULMOZYME) 5 mg in sterile water (preservative free) 30 mL   . enoxaparin (LOVENOX) injection 30 mg   . docusate sodium (COLACE) capsule 100 mg   . polyethylene glycol (MIRALAX / GLYCOLAX) packet 17 g   . midazolam (VERSED) injection 1 mg   . midazolam (VERSED) injection 1 mg   . insulin aspart (novoLOG) injection 0-9 Units   . Resource ThickenUp Clear 120 g   . alteplase (CATHFLO ACTIVASE) 10 mg in sodium chloride (PF) 0.9 % 30 mL   . dornase alpha (PULMOZYME) 5 mg in sterile water (preservative free) 30 mL   .  insulin glargine (LANTUS) injection 20 Units   . insulin aspart (novoLOG) injection 5 Units   . feeding supplement (VITAL AF 1.2 CAL) liquid 1,000 mL   . amiodarone (NEXTERONE PREMIX) 360-4.14 MG/200ML-% (1.8 mg/mL) IV infusion   . amiodarone (NEXTERONE PREMIX) 360-4.14 MG/200ML-% (1.8 mg/mL) IV infusion   . norepinephrine (LEVOPHED) 84m in 2589mpremix infusion   . enoxaparin (LOVENOX) injection 40 mg   . metoprolol tartrate (LOPRESSOR) 25 mg/10 mL oral suspension 25 mg   . chlorhexidine gluconate (MEDLINE KIT) (PERIDEX) 0.12 % solution 15 mL   . chlorhexidine gluconate (MEDLINE KIT) (PERIDEX) 0.12 % solution 15 mL   . MEDLINE mouth rinse   . fentaNYL (SUBLIMAZE) injection 25-100 mcg   . piperacillin-tazobactam (ZOSYN) IVPB 3.375 g   . amoxicillin-clavulanate (AUGMENTIN) 875-125 MG per tablet 1 tablet P&T Policy: Renal Dose Adjustment   . dexmedetomidine (PRECEDEX) 400 MCG/100ML (4 mcg/mL) infusion   . famotidine (PEPCID) IVPB 20 mg premix   . midazolam (VERSED) 2 MG/2ML injection Returned to ADS  . diltiazem (CARDIZEM) 10 mg/ml oral suspension 60 mg   . metoprolol tartrate (LOPRESSOR) injection 2.5-5 mg   . metoprolol tartrate (LOPRESSOR) 25 mg/10 mL oral suspension 25 mg   . amoxicillin-clavulanate (AUGMENTIN) 500-125 MG per tablet 500 mg   . ceFEPIme (MAXIPIME) 1 g in sodium chloride 0.9 % 100 mL IVPB Dose change  . enoxaparin (LOVENOX) injection 30 mg   . prothrombin complex conc human (KCENTRA) IVPB 1,500 Units   . hemostatic agents Patient Discharge  . 0.9 % irrigation (POUR BTL) Patient Discharge  .  ceFAZolin (ANCEF) IVPB 2g/100 mL premix Patient Transfer  . amiodarone (NEXTERONE PREMIX) 360-4.14 MG/200ML-% (1.8 mg/mL) IV infusion   . alteplase (CATHFLO ACTIVASE) 10 mg in sodium chloride (PF) 0.9 % 30 mL   . dornase alpha (PULMOZYME) 5 mg in sterile water (preservative free) 30 mL   . hydrocortisone sodium succinate (SOLU-CORTEF) 100 MG injection 50 mg   . MEDLINE mouth rinse    . polyethylene glycol (MIRALAX / GLYCOLAX) packet 17 g   . sodium chloride flush (NS) 0.9 % injection 10 mL   . sodium chloride flush (NS) 0.9 % injection 10-40 mL   . sodium chloride flush (NS) 0.9 % injection 10-40 mL   . insulin glargine (LANTUS) injection 25 Units   . insulin aspart (novoLOG) injection 7 Units   . 0.9 %  sodium chloride infusion (Manually program via Guardrails IV Fluids)   . feeding supplement (VITAL AF 1.2 CAL) liquid 1,000 mL   . feeding supplement (PRO-STAT SUGAR FREE 64) liquid 30 mL   . glucagon (human recombinant) (GLUCAGEN) 5 mg in dextrose 5 % 50 mL (0.1 mg/mL) infusion   . 0.9 %  sodium chloride infusion (Manually program via Guardrails IV Fluids)   . 0.9 %  sodium chloride infusion (Manually program via Guardrails IV Fluids)   . aspirin tablet 325 mg   . propofol (DIPRIVAN) 1000 MG/100ML infusion   . docusate (COLACE) 50 MG/5ML liquid 100 mg   . polyethylene glycol (MIRALAX / GLYCOLAX) packet 17 g   . insulin aspart (novoLOG) injection 2-3 Units   . potassium chloride 10 mEq in 50 mL *CENTRAL LINE* IVPB   . 0.45 % sodium chloride infusion   . ceFEPIme (MAXIPIME) 2 g in sodium chloride 0.9 % 100 mL IVPB     Social History:  reports that she has never smoked. She has never used smokeless tobacco. She reports current alcohol use. She reports that she does not use drugs.  Family History:   Family History  Problem Relation Age of Onset  . Hypertension Mother   . Diabetes Sister   . Cancer Brother        Rectal  . Lung cancer Brother     Review of systems not obtained due to patient factors. Weight change: 2.6 kg  Intake/Output Summary (Last 24 hours) at 12/25/2019 1307 Last data filed at 12/25/2019 1200 Gross per 24 hour  Intake 3786.45 ml  Output 475 ml  Net 3311.45 ml   BP (!) 92/55   Pulse 64   Temp (!) 97.5 F (36.4 C)   Resp 20   Ht _0  (1.626 m)   Wt 85 kg   SpO2 100%   BMI 32.17 kg/m  Vitals:   12/25/19 1100 12/25/19 1130  12/25/19 1200 12/25/19 1203  BP: 101/65 (!) 93/54 (!) 92/55 (!) 92/55  Pulse: 62 61 (!) 59 64  Resp: _1 Temp: (!) 97.3 F (36.3 C) (!) 97.3 F (36.3 C) (!) 97.5 F (36.4 C) (!) 97.5 F (36.4 C)  TempSrc:   Bladder   SpO2: 99% 99% 99% 100%  Weight:      Height:         General appearance: toxic and intubated Head: s/p craniectomy Eyes: positive findings: conjunctiva: edema Resp: rhonchi bilaterally Cardio: regular rate and rhythm and no rub GI: +BS, tense, nontender Extremities: edema 2+ anasarca  Labs: Basic Metabolic Panel: Recent Labs  Lab 12/19/19 0449 12/19/19 2027 12/20/19 0532 12/20/19 0532 01/07/2020 0344  12/22/2019 0422 01/14/2020 1604 01/03/2020 1856 12/22/19 0339 12/22/19 0348 12/23/19 0427 12/24/19 0343 12/25/19 0456  NA 137   < > 144   < > 146*   < > 150* 147* 146* 147* 140 139 134*  K 4.0   < > 4.1   < > 3.1*   < > 3.9 3.8 3.8 3.7 3.6 3.3* 3.7  CL 101  --  107  --  114*  --   --  111 112*  --  104 107 102  CO2 23  --  23  --  14*  --   --  20* 20*  --  19* 19* 17*  GLUCOSE 239*  --  99  --  157*  --   --  123* 131*  --  323* 224* 201*  BUN 49*  --  53*  --  58*  --   --  67* 68*  --  78* 87* 96*  CREATININE 2.45*  --  2.51*  --  2.86*  --   --  3.25* 3.25*  --  3.70* 3.58* 3.93*  ALBUMIN 1.2*  --   --   --  <1.0*  --   --  2.0*  --   --  2.0*  --  1.7*  CALCIUM 7.3*  --  8.0*  --  6.2*  --   --  8.0* 7.9*  --  8.0* 7.3* 7.6*  PHOS 4.4  --   --   --   --   --   --  7.8*  --   --  6.5* 6.1* 7.2*   < > = values in this interval not displayed.   Liver Function Tests: Recent Labs  Lab 01/12/2020 1856 12/23/19 0427 12/25/19 0456  AST 262* 334* 71*  ALT 94* 150* 52*  ALKPHOS 123 145* 129*  BILITOT 1.4* 0.8 0.7  PROT 5.3* 5.9* 6.0*  ALBUMIN 2.0* 2.0* 1.7*   No results for input(s): LIPASE, AMYLASE in the last 168 hours. No results for input(s): AMMONIA in the last 168 hours. CBC: Recent Labs  Lab 12/18/19 1943 12/19/19 0449 12/23/19 1730  12/24/19 0343 12/24/19 1723 12/25/19 0456  WBC 16.6*   < > 16.8* 17.3* 22.9* 18.5*  NEUTROABS 13.6*  --   --   --   --   --   HGB 8.1*   < > 8.5* 8.5* 9.6* 7.9*  HCT 24.8*   < > 25.5* 25.5* 29.7* 24.2*  MCV 89.2   < > 89.2 89.8 90.8 91.3  PLT 206   < > 98* 103* 123* 101*   < > = values in this interval not displayed.   PT/INR: _0 (inr:5) Cardiac Enzymes: )No results for input(s): CKTOTAL, CKMB, CKMBINDEX, TROPONINI in the last 168 hours. CBG: Recent Labs  Lab 12/24/19 2006 12/25/19 0010 12/25/19 0343 12/25/19 0758 12/25/19 1126  GLUCAP 177* 177* 181* 224* 245*    Iron Studies: No results for input(s): IRON, TIBC, TRANSFERRIN, FERRITIN in the last 168 hours.  Xrays/Other Studies: DG Chest Port 1 View  Result Date: 12/25/2019 CLINICAL DATA:  Check endotracheal tube placement EXAM: PORTABLE CHEST 1 VIEW COMPARISON:  12/24/2019 FINDINGS: Endotracheal tube, feeding catheter and right jugular sheath are again seen and stable. Three left sided chest tubes are noted. No pneumothorax is noted. Lateral density is noted stable from the prior exam likely representing some loculated fluid. Cardiac shadow is stable. Spacing device is noted. Slight increased density is noted over the bases bilaterally consistent  with small effusions. No new focal confluent infiltrate is seen. IMPRESSION: Overall appearance is stable from the prior study. Electronically Signed   By: Inez Catalina M.D.   On: 12/25/2019 09:30   DG Chest Port 1 View  Result Date: 12/24/2019 CLINICAL DATA:  64 year old female status post intubation. EXAM: PORTABLE CHEST 1 VIEW COMPARISON:  Chest x-ray 12/23/2019. FINDINGS: An endotracheal tube is in place with tip 3.1 cm above the carina. A feeding tube is seen extending into the abdomen, however, the tip of the feeding tube extends below the lower margin of the image. There is a right-sided internal jugular central venous catheter with tip terminating in the mid superior vena  cava. There is a left upper extremity PICC with tip terminating in the superior cavoatrial junction. 3 left-sided chest tubes are noted, 1 with tip in the medial aspect of the left apex, another with tip in the lateral upper left hemithorax, and the other with tip in the medial left lower hemithorax, similar to the prior study. No appreciable pneumothorax. Retrocardiac opacity completely obscuring the left hemidiaphragm, similar to the prior study, which may reflect atelectasis and/or consolidation. Opacity in the mid to upper left hemithorax laterally adjacent to the chest tube, likely some loculated fluid. Small to moderate right pleural effusion. Probable subsegmental atelectasis at the right lung base. No evidence of pulmonary edema. Mild cardiomegaly. Upper mediastinal contours are within normal limits. Status post median sternotomy. Right-sided pacemaker device in place with lead tips projecting over the expected location of the right atrium and right ventricle. Surgical clips project over the left chest wall laterally. IMPRESSION: 1. Postoperative changes and support apparatus, as detailed above. 2. Otherwise, there is no significant interval change in the radiographic appearance the chest, as detailed above. Electronically Signed   By: Vinnie Langton M.D.   On: 12/24/2019 09:59     Assessment/Plan: 1.  AKI, oliguric- presumably due to ischemic ATN in setting of hemorrhagic shock and pressor support.  UOP dropped since 12/19/19.  Given IV lasix 40 mg tid per PCCM with only 325 ml UOP.  Discussed case with Dr. Vaughan Browner, as well as Mr. Cimini.  Will attempt high dose IV lasix and see if she responds but will likely need to initiate CVVHDF in am given her volume overload and borderline BP's.  2. Hemorrhagic shock- due to hemothorax.  S/p multiple transfusions.  Hgb trending down.  Transfuse per PCCM. 3. Acute hypoxic respiratory failure- hemothorax and mucus plugging seen on bronchoscopy due to likely  aspiration.  Continue with ventilator per PCCM 4. 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 5. Diabetes Mellitus type 2- per primary 6. Chronic atrial fibrillation - Eliquis on hold 7. Protein malnutrition, severe- supplement with tube feeds 8. Disposition- poor overall prognosis.  Recommend palliative care consult to help set goals/limits of care.    Broadus John A Shoichi Mielke 12/25/2019, 1:07 PM

## 2019-12-25 NOTE — Progress Notes (Signed)
Inpatient Rehabilitation Admissions Coordinator  I will follow at a distance.  Danne Baxter, RN, MSN Rehab Admissions Coordinator [phone] 12/25/2019 8:52 AM

## 2019-12-25 NOTE — Progress Notes (Signed)
4 Days Post-Op Procedure(s) (LRB): Video Assisted Thoracoscopy (Vats) left , Drainage of Hemothorax (Left) Video Bronchoscopy (N/A) Subjective:  The patient continues to have a decreased level of responsiveness since a right hemorrhagic MCA infarct approximately 4 to 5 days ago. Chest tube output is approximately 200 cc/day. Chest x-ray is satisfactory. We'll leave chest tubes in place for now. No air leak noted.  Objective: Vital signs in last 24 hours: Temp:  [97 F (36.1 C)-97.7 F (36.5 C)] 97.3 F (36.3 C) (06/10 1530) Pulse Rate:  [31-123] 68 (06/10 1530) Cardiac Rhythm: Atrial fibrillation (06/10 1200) Resp:  [12-34] 20 (06/10 1530) BP: (81-185)/(48-128) 88/53 (06/10 1530) SpO2:  [90 %-100 %] 99 % (06/10 1530) FiO2 (%):  [30 %] 30 % (06/10 1506) Weight:  [85 kg] 85 kg (06/10 0500)  Hemodynamic parameters for last 24 hours: CVP:  [17 mmHg-37 mmHg] 33 mmHg  Intake/Output from previous day: 06/09 0701 - 06/10 0700 In: 3958.6 [I.V.:2217.2; NG/GT:1640; IV Piggyback:101.4] Out: 450 [Urine:130; Stool:140; Chest Tube:180] Intake/Output this shift: Total I/O In: 1029 [I.V.:549; NG/GT:480] Out: 150 [Urine:50; Chest Tube:100]  Exam  Patient appears comfortable but with minimal response and opening eyes Breath sounds slightly diminished at left base Tubes in secure without air leak  Lab Results: Recent Labs    12/24/19 1723 12/25/19 0456  WBC 22.9* 18.5*  HGB 9.6* 7.9*  HCT 29.7* 24.2*  PLT 123* 101*   BMET:  Recent Labs    12/24/19 0343 12/25/19 0456  NA 139 134*  K 3.3* 3.7  CL 107 102  CO2 19* 17*  GLUCOSE 224* 201*  BUN 87* 96*  CREATININE 3.58* 3.93*  CALCIUM 7.3* 7.6*    PT/INR: No results for input(s): LABPROT, INR in the last 72 hours. ABG    Component Value Date/Time   PHART 7.403 12/22/2019 0348   HCO3 22.8 12/22/2019 0348   TCO2 24 12/22/2019 0348   ACIDBASEDEF 2.0 12/22/2019 0348   O2SAT 99.0 12/22/2019 0348   CBG (last 3)  Recent Labs     12/25/19 0758 12/25/19 1126 12/25/19 1600  GLUCAP 224* 245* 270*    Assessment/Plan: S/P Procedure(s) (LRB): Video Assisted Thoracoscopy (Vats) left , Drainage of Hemothorax (Left) Video Bronchoscopy (N/A) Continue chest tube drainage of left hemothorax. Final cultures of pleural fluid have been negative.   LOS: 11 days    Tharon Aquas Trigt III 12/25/2019

## 2019-12-25 NOTE — Progress Notes (Signed)
NAME:  Krystal Little, MRN:  [id number], DOB:  01-02-1956, LOS: 11 ADMISSION DATE:  12/08/2019, CONSULTATION DATE:  5/29 REFERRING MD:  Letta Pate, CHIEF COMPLAINT:  Chest congestion   Brief History   1 female with an extensive cardiac history was admitted to [facility] in April in the setting of a right MCA stroke, had a right interventional radiology guided thrombectomy, later had significant brain edema and underwent craniectomy, ultimately transferred to inpatient rehab.  Pulmonary and critical care medicine was consulted on May 29 in the setting of tachypnea and an enlarging left-sided pleural effusion. Thoracentesis was performed which showed a hemothorax.  A CT chest was then ordered showing obstruction of her left mainstem bronchus and a loculated left pleural effusion.  On May 30 she was moved to the ICU for chest tube placement and bronchoscopy.   Past Medical History  Status post mitral valve repair 2001 Status post cardiac pacemaker 2001 History of sick sinus syndrome Hypertension Diabetes mellitus type 2 Chronic atrial fibrillation Right MCA stroke 2021  Significant Hospital Events   4/16 IR thrombectomy R MCA stroke 4/25 PCCM sign off 5/6 transfer to [facility] Inpatient [facility]hab 5/29 PCCM consult left pleural effusion, thoracentesis 5/30 worsening tachypnea. Move to ICU for intubation to facilitate bronchoscopy and chest tube placement 5/31 TPA/ pulmonzyme started for 3 days for persistent left effusion 6/1 Afebrile, on low-dose Levophed, sedated on low-dose Precedex, Minimal output on chest tube- pulmozyme and tpa x 2, urine output picking up 6/2 Weaned x 7 hours yesterday PSV 15/5, pulmozyme and TPA completed 3 days 6/3 afib with RVR placed on amio gtt overnight , 1U PRBC for Hb 7.1  6/5 extubated successfully, Repeat TPA/Pulmonzyme given again  6/6 acute decompensation overnight, hemorrhagic shock > to OR for left VATS with 2L hemothorax drained.  CT head with new ICH>  neurosurgery consulted 6/9 More awake but agitated on PSV weaning   Consults:  PCCM  Procedures:  5/29 Thoracentesis >> 1.5 L bloody fluid 5/30 ETT >  5/30 L pigtail chest tube >  Bronchoscopy 5/30 >>  large, thick, grey mucus plug completely occluding the left mainstem bronchus.   Significant Diagnostic Tests:  5/29 Thoracentesis left chest >  8K WBC, 78% WBC, protein 4.0, LDH 182  CT chest abdomen pelvis 6/6 > large left hemithorax, patchy right lung opacities with small right effusion.  No acute abdominal process  CT head 6/6 > evolution of right MCA infarct with hemorrhagic transformation with large intraparenchymal hematoma, 2 additional small hemorrhages in the inferior cerebellum and left temporal lobe.  Trace subarachnoid blood.  Micro Data:  RVP 4/16 neg  MRSA PCR  4/17 neg   ======== 5/29 Left pleural fluid culture > negative 5/29 left pleural fluid cytology >> - No malignant cells identified  - Blood and acute inflammation.  5/30 BAL > ng  Antimicrobials:  4/24 cefepime > 4/25 4/28 zosyn > 5/4 5/30 zosyn >   Interim history/subjective:   Remains sedated, does not follow commands.  Low urine output which has not responded to Lasix.  Objective   Blood pressure (!) 83/52, pulse 63, temperature (!) 97.5 F (36.4 C), resp. rate 17, height _0  (1.626 m), weight 85 kg, SpO2 98 %. CVP:  [15 mmHg-37 mmHg] 22 mmHg  Vent Mode: PRVC FiO2 (%):  [30 %] 30 % Set Rate:  [20 bmp] 20 bmp Vt Set:  [430 mL] 430 mL PEEP:  [5 cmH20] 5 cmH20 Plateau Pressure:  [24 cmH20-29 cmH20]  24 cmH20   Intake/Output Summary (Last 24 hours) at 12/25/2019 1048 Last data filed at 12/25/2019 0900 Gross per 24 hour  Intake 3829.94 ml  Output 475 ml  Net 3354.94 ml   Filed Weights   12/23/19 0351 12/24/19 0500 12/25/19 0500  Weight: 77 kg 82.4 kg 85 kg    Examination: Gen:      No acute distress HEENT:  EOMI, sclera anicteric, ETT Neck:     No masses; no thyromegaly Lungs:     Clear to auscultation bilaterally; normal respiratory effort CV:         Regular rate and rhythm; no murmurs Abd:      + bowel sounds; soft, non-tender; no palpable masses, no distension Ext:    No edema; adequate peripheral perfusion Skin:      Warm and dry; no rash Neuro: Awake, non purposeful  Lab significant for Sodium 134, BUN/creatinine 96/3.93 Phosphorus 7.2, WBC 18.5, hemoglobin 7.9, platelets 101  Assessment & Plan:   Hemorrhagic shock - 2/2 hemothorax.  Resolved s/p multiple transfusions and OR 6/6 for VATS. P: Continue to follow H/H and chest tube output, seems to have stabilized. Continue supportive care.  Acute respiratory failure with hypoxemia Mucus plugging seen on bronchoscopy > likely aspiration Left pleural effusion -S/P Thoracentesis with later placement of  L pigtail chest tube  P: Continue ventilator support with lung protective strategies. Pressure support weans as tolerated.  She might require a few days to allow for neuro recovery and if not much change and husband continues to desire aggressive care, could consider trach. Continue abx and follow cultures. Continue chest tube per TCTS. Follow CXR.  AKI - presumed 2/2 shock.  No evidence of obstruction on renal US. Hypernatremia. P: Urine output is dropping off.  Will need nephrology consult. Likely headed towards dialysis Avoid nephrotoxins, ensure adequate renal perfusion. 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  Evaluted by neurosurgery who felt that CTs were stable and no surgical evacuation or EVD was warranted. P: Continue to follow right skull flap, ensure does not tighten.  No interventions planned by neurosurgery. Continue neuro protective measures Repeat head CT to reassess bleed Supportive care PT/OT/SLP when able  DM2 with hyperglycemia. P: Continue SSI,  add tube feed coverage CBG q4hrs  Chronic atrial fibrillation/s/p pacemaker P: Continuous telemetry Continue to hold Eliquis Continue amiodarone   Protein calorie malnutrition  P: Continue TF   Goals of care P: Family updated daily Discussed prognosis and possibility of her not coming off the ventilator, may need tracheostomy.  Also with renal failure we need to consider dialysis. Husband does not want to consider any of these outcomes and feels that she will walk out of the hospital.  He is not interested in meeting palliative care.  Best practice:  Diet: TFs Pain/Anxiety/Delirium protocol (if indicated): precedex, prn fentanyl, prn versed RASS target 0 to -1 VAP protocol (if indicated): yes DVT prophylaxis: SCDs GI prophylaxis: PPI Glucose control: SSI Mobility: bed rest Code Status: full Family Communication: See above Disposition:  ICU  The patient is critically ill with multiple organ system failure and requires high complexity decision making for assessment and support, frequent evaluation and titration of therapies, advanced monitoring, review of radiographic studies and interpretation of complex data.   Critical Care Time devoted to patient care services, exclusive of separately billable procedures, described in this note  is 35 minutes.   Marshell Garfinkel MD [facility] Please see [email or web address] for pager details.  12/25/2019, 10:48 AM

## 2019-12-26 ENCOUNTER — Inpatient Hospital Stay (HOSPITAL_COMMUNITY): Payer: Medicare Other

## 2019-12-26 LAB — BASIC METABOLIC PANEL
Anion gap: 16 — ABNORMAL HIGH (ref 5–15)
BUN: 106 mg/dL — ABNORMAL HIGH (ref 8–23)
CO2: 15 mmol/L — ABNORMAL LOW (ref 22–32)
Calcium: 7.9 mg/dL — ABNORMAL LOW (ref 8.9–10.3)
Chloride: 100 mmol/L (ref 98–111)
Creatinine, Ser: 4.21 mg/dL — ABNORMAL HIGH (ref 0.44–1.00)
GFR calc Af Amer: 12 mL/min — ABNORMAL LOW (ref >60)
GFR calc non Af Amer: 10 mL/min — ABNORMAL LOW (ref >60)
Glucose, Bld: 345 mg/dL — ABNORMAL HIGH (ref 70–99)
Potassium: 4.5 mmol/L (ref 3.5–5.1)
Sodium: 131 mmol/L — ABNORMAL LOW (ref 135–145)

## 2019-12-26 LAB — CULTURE, BLOOD (ROUTINE X 2)
Culture: NO GROWTH
Culture: NO GROWTH

## 2019-12-26 LAB — GLUCOSE, CAPILLARY
Glucose-Capillary: 313 mg/dL — ABNORMAL HIGH (ref 70–99)
Glucose-Capillary: 288 mg/dL — ABNORMAL HIGH (ref 70–99)
Glucose-Capillary: 305 mg/dL — ABNORMAL HIGH (ref 70–99)
Glucose-Capillary: 221 mg/dL — ABNORMAL HIGH (ref 70–99)
Glucose-Capillary: 166 mg/dL — ABNORMAL HIGH (ref 70–99)

## 2019-12-26 LAB — RENAL FUNCTION PANEL
Albumin: 1.5 g/dL — ABNORMAL LOW (ref 3.5–5.0)
Anion gap: 15 (ref 5–15)
BUN: 105 mg/dL — ABNORMAL HIGH (ref 8–23)
CO2: 16 mmol/L — ABNORMAL LOW (ref 22–32)
Calcium: 7.7 mg/dL — ABNORMAL LOW (ref 8.9–10.3)
Chloride: 102 mmol/L (ref 98–111)
Creatinine, Ser: 4.13 mg/dL — ABNORMAL HIGH (ref 0.44–1.00)
GFR calc Af Amer: 12 mL/min — ABNORMAL LOW (ref >60)
GFR calc non Af Amer: 11 mL/min — ABNORMAL LOW (ref >60)
Glucose, Bld: 262 mg/dL — ABNORMAL HIGH (ref 70–99)
Phosphorus: 8 mg/dL — ABNORMAL HIGH (ref 2.5–4.6)
Potassium: 4 mmol/L (ref 3.5–5.1)
Sodium: 133 mmol/L — ABNORMAL LOW (ref 135–145)

## 2019-12-26 LAB — CBC
HCT: 27.8 % — ABNORMAL LOW (ref 36.0–46.0)
Hemoglobin: 9 g/dL — ABNORMAL LOW (ref 12.0–15.0)
MCH: 29.5 pg (ref 26.0–34.0)
MCHC: 32.4 g/dL (ref 30.0–36.0)
MCV: 91.1 fL (ref 80.0–100.0)
Platelets: 121 10*3/uL — ABNORMAL LOW (ref 150–400)
RBC: 3.05 MIL/uL — ABNORMAL LOW (ref 3.87–5.11)
RDW: 17.1 % — ABNORMAL HIGH (ref 11.5–15.5)
WBC: 20.9 10*3/uL — ABNORMAL HIGH (ref 4.0–10.5)
nRBC: 0.1 % (ref 0.0–0.2)

## 2019-12-26 LAB — MAGNESIUM: Magnesium: 2.5 mg/dL — ABNORMAL HIGH (ref 1.7–2.4)

## 2019-12-26 LAB — AEROBIC/ANAEROBIC CULTURE W GRAM STAIN (SURGICAL/DEEP WOUND): Culture: NO GROWTH

## 2019-12-26 LAB — PHOSPHORUS: Phosphorus: 8 mg/dL — ABNORMAL HIGH (ref 2.5–4.6)

## 2019-12-26 MED ORDER — NOREPINEPHRINE 16 MG/250ML-% IV SOLN
0.0000 ug/min | INTRAVENOUS | Status: DC
  Administered 2019-12-26: 2 ug/min via INTRAVENOUS
  Filled 2019-12-26: qty 250

## 2019-12-26 MED ORDER — B COMPLEX-C PO TABS
1.0000 | ORAL_TABLET | Freq: Every day | ORAL | Status: DC
  Administered 2019-12-26 – 2020-01-07 (×13): 1
  Filled 2019-12-26 (×13): qty 1

## 2019-12-26 MED ORDER — PRISMASOL BGK 4/2.5 32-4-2.5 MEQ/L REPLACEMENT SOLN: Status: DC

## 2019-12-26 MED ORDER — PRISMASOL BGK 4/2.5 32-4-2.5 MEQ/L IV SOLN
INTRAVENOUS | Status: DC
  Administered 2019-12-28 – 2019-12-29 (×2): 1500 mL/h via INTRAVENOUS_CENTRAL

## 2019-12-26 MED ORDER — HEPARIN SODIUM (PORCINE) 1000 UNIT/ML DIALYSIS
1000.0000 [IU] | INTRAMUSCULAR | Status: DC | PRN
  Filled 2019-12-26: qty 6

## 2019-12-26 MED ORDER — LORAZEPAM 2 MG/ML IJ SOLN
2.0000 mg | INTRAMUSCULAR | Status: AC | PRN
  Administered 2019-12-26 – 2019-12-27 (×3): 2 mg via INTRAVENOUS
  Filled 2019-12-26 (×3): qty 1

## 2019-12-26 MED ORDER — SODIUM CHLORIDE 0.9 % IV SOLN
2.0000 g | Freq: Two times a day (BID) | INTRAVENOUS | Status: DC
  Administered 2019-12-27: 2 g via INTRAVENOUS
  Filled 2019-12-26 (×2): qty 2

## 2019-12-26 MED ORDER — PHENYLEPHRINE HCL-NACL 10-0.9 MG/250ML-% IV SOLN
0.0000 ug/min | INTRAVENOUS | Status: DC
  Administered 2019-12-26: 20 ug/min via INTRAVENOUS

## 2019-12-26 MED ORDER — INSULIN ASPART 100 UNIT/ML ~~LOC~~ SOLN
6.0000 [IU] | SUBCUTANEOUS | Status: DC
  Administered 2019-12-26 – 2019-12-31 (×29): 6 [IU] via SUBCUTANEOUS

## 2019-12-26 MED ORDER — INSULIN GLARGINE 100 UNIT/ML ~~LOC~~ SOLN
8.0000 [IU] | Freq: Every day | SUBCUTANEOUS | Status: DC
  Administered 2019-12-26: 8 [IU] via SUBCUTANEOUS
  Filled 2019-12-26: qty 0.08

## 2019-12-26 MED ORDER — LEVETIRACETAM IN NACL 1000 MG/100ML IV SOLN
1000.0000 mg | Freq: Once | INTRAVENOUS | Status: AC
  Administered 2019-12-26: 1000 mg via INTRAVENOUS
  Filled 2019-12-26: qty 100

## 2019-12-26 MED ORDER — INSULIN GLARGINE 100 UNIT/ML ~~LOC~~ SOLN
10.0000 [IU] | Freq: Every day | SUBCUTANEOUS | Status: DC
  Administered 2019-12-27 – 2019-12-29 (×3): 10 [IU] via SUBCUTANEOUS
  Filled 2019-12-26 (×4): qty 0.1

## 2019-12-26 MED ORDER — LEVETIRACETAM IN NACL 500 MG/100ML IV SOLN
500.0000 mg | Freq: Two times a day (BID) | INTRAVENOUS | Status: DC
  Administered 2019-12-26 – 2019-12-27 (×2): 500 mg via INTRAVENOUS
  Filled 2019-12-26 (×2): qty 100

## 2019-12-26 NOTE — Procedures (Addendum)
Patient Name: Krystal Little  MRN: [id number]  Epilepsy Attending: Lora Havens  Referring Physician/Provider: Noe Gens, NP Date: 12/26/2019 Duration: 24.51 mins  Patient history: 64 year old female with right temporal and posterior fossa hematoma s/p hemicraniectomy who was noted to have seizure-like activity.  EEG evaluate for seizures.  Level of alertness: comatose  AEDs during EEG study: None  Technical aspects: This EEG study was done with scalp electrodes positioned according to the 10-20 International system of electrode placement. Electrical activity was acquired at a sampling rate of 500Hz  and reviewed with a high frequency filter of 70Hz  and a low frequency filter of 1Hz . EEG data were recorded continuously and digitally stored.   Description: EEG showed continuous generalized and lateralized right hemisphere 3 to 5 Hz theta-delta slowing.  Sharp transients were also seen in the right frontocentral region.  Hyperventilation and photic stimulation were not performed.     ABNORMALITY -Continuous slow, generalized and lateralized right hemisphere  IMPRESSION: This study is suggestive of cortical dysfunction in the right hemisphere consistent with underlying craniotomy.  Additionally, there is evidence of severe diffuse encephalopathy, nonspecific to etiology.  No seizures or definite epileptiform discharges were seen throughout the recording.  If concern for interictal/ictal activity persists, consider long-term EEG.      Delecia Vastine Barbra Sarks

## 2019-12-26 NOTE — Progress Notes (Addendum)
Called to bedside to assess pt for possible seizure activity.   Pt is critically ill appearing, intubated, sedated, with worsening renal failure-- expected to start CRRT today. EEG acquired earlier this morning which did not show epileptiform activity.  On my arrival LUE is flaccid. RUE with rhythmic involuntary contractions. Eyes with upward gaze. Lip smacking.   Versed given by bedside RN with resolution of this seizure-like activity. Husband at bedside throughout, he expresses frustration about possible seizure.   Seizure-like activity P - Keppra 1000mg  bolus followed by  keppra 500mg  BID -PRN Ativan q42min x 3 doses for repeat seizure-like activity -check ionized calcium, mag this morning 2.5  -LTM  -seizure precautions    Additional critical care time 25 minutes   Eliseo Gum MSN, AGACNP-BC [place] [phone] If no answer, [phone] 12/26/2019, 1:34 PM

## 2019-12-26 NOTE — Progress Notes (Signed)
Head jerking noticed at 2332, patient's eyes open, diaphoretic. 2 mg of IV ativan given PRN for seizure. No movements noted to any extremity.

## 2019-12-26 NOTE — Procedures (Signed)
Hemodialysis Catheter Insertion Procedure Note KAYLENE DAWN [id number] 12-15-1955  Procedure: Insertion of Hemodialysis Catheter Indications: Dialysis Access   Procedure Details Consent: Risks of procedure as well as the alternatives and risks of each were explained to the (patient/caregiver).  Consent for procedure obtained.    Time Out: Verified patient identification, verified procedure, site/side was marked, verified correct patient position, special equipment/implants available, medications/allergies/relevent history reviewed, required imaging and test results available.  Performed  Maximum sterile technique was used including antiseptics, cap, gloves, gown, hand hygiene, mask and sheet. Skin prep: Chlorhexidine; local anesthetic administered Triple lumen hemodialysis catheter was inserted into left internal jugular vein using the Seldinger technique.  Evaluation Blood flow good Complications: No apparent complications Patient did tolerate procedure well. Chest X-ray ordered to verify placement.  CXR: pending.   Procedure performed under direct supervision of Dr. Lake Bells and with ultrasound guidance for real time vessel cannulation.      Noe Gens, MSN, NP-C [facility] 12/26/2019, 11:14 AM   Please see [email or web address] for pager details.

## 2019-12-26 NOTE — Progress Notes (Signed)
Inpatient Diabetes Program Recommendations  AACE/ADA: New Consensus Statement on Inpatient Glycemic Control (2015)  Target Ranges:  Prepandial:   less than 140 mg/dL      Peak postprandial:   less than 180 mg/dL (1-2 hours)      Critically ill patients:  140 - 180 mg/dL   Lab Results  Component Value Date   GLUCAP 288 (H) 12/26/2019   HGBA1C 9.7 (H) 11/01/2019    Review of Glycemic Control Results for Krystal Little, Krystal Little (MRN [id number]) as of 12/26/2019 11:17  Ref. Range 12/25/2019 16:00 12/25/2019 19:38 12/25/2019 23:44 12/26/2019 03:51 12/26/2019 08:11  Glucose-Capillary Latest Ref Range: 70 - 99 mg/dL 270 (H) 325 (H) 300 (H) 313 (H) 288 (H)   Diabetes history: DM 2 Outpatient Diabetes medications:  Lantus 20 units bid, Novolog 8 units bid Current orders for Inpatient glycemic control:  Novolog resistant q 4 hours, Novolog 6 units q 4 hours, Lantus 8 units daily Inpatient Diabetes Program Recommendations:   Consider increasing Lantus to 10 units bid.   Thanks  Adah Perl, RN, BC-ADM Inpatient Diabetes Coordinator Pager [phone] (8a-5p)

## 2019-12-26 NOTE — Progress Notes (Signed)
EEG complete - results pending 

## 2019-12-26 NOTE — Progress Notes (Signed)
  NEUROSURGERY PROGRESS NOTE   No issues overnight. Pt has been somewhat agitated, now on precedex, fentanyl  EXAM:  BP (!) 93/52   Pulse (!) 59   Temp (!) 97.2 F (36.2 C)   Resp 13   Ht 5\' 4"  (1.626 m)   Wt 90 kg   SpO2 100%   BMI 34.06 kg/m   On sedation  eyes open to noxious stim  Pupils reactive  Breathing over vent  No motor responses  IMAGING: CTH yesterday reviewed, largely stable right temporal and posterior fossa hematomas. resolution of IVH, no HCP.   IMPRESSION:  64 y.o. female with large hemothorax with hemorrhage into right temporal stroke and smaller pos fossa hemorrhages. Thankfully she has not developed hydrocephalus. Do not see any role for neurosurgical intervention.  PLAN: - Cont care per primary service, please call with any changes/questions.

## 2019-12-26 NOTE — Plan of Care (Signed)
  Problem: Clinical Measurements: Goal: Ability to maintain clinical measurements within normal limits will improve Outcome: Not Progressing Note: Patient requiring pressors and bair hugger after initiating CRRT.

## 2019-12-26 NOTE — Progress Notes (Addendum)
NAME:  Krystal Little, MRN:  [id number], DOB:  10-15-1955, LOS: 12 ADMISSION DATE:  11/19/2019, CONSULTATION DATE:  5/29 REFERRING MD:  Letta Pate, CHIEF COMPLAINT:  Chest congestion   Brief History   36 female with an extensive cardiac history was admitted to [facility] in April in the setting of a right MCA stroke, had a right interventional radiology guided thrombectomy, later had significant brain edema and underwent craniectomy, ultimately transferred to inpatient rehab.  Pulmonary and critical care medicine was consulted on May 29 in the setting of tachypnea and an enlarging left-sided pleural effusion. Thoracentesis was performed which showed a hemothorax.  A CT chest was then ordered showing obstruction of her left mainstem bronchus and a loculated left pleural effusion.  On May 30 she was moved to the ICU for chest tube placement and bronchoscopy.   Past Medical History  Status post mitral valve repair 2001 Status post cardiac pacemaker 2001 History of sick sinus syndrome Hypertension Diabetes mellitus type 2 Chronic atrial fibrillation Right MCA stroke 2021  Significant Hospital Events   4/16 IR thrombectomy R MCA stroke 4/25 PCCM sign off 5/6 transfer to [facility] Inpatient Rehab 5/29 PCCM consult left pleural effusion, thoracentesis 5/30 worsening tachypnea. Move to ICU for intubation to facilitate bronchoscopy and chest tube placement 5/31 TPA/ pulmonzyme started for 3 days for persistent left effusion 6/01 Afebrile, low-dose Levophed, Precedex, Minimal output on chest tube- pulmozyme / tpa x 2,  6/02 Weaned x 7 hours yesterday PSV 15/5, pulmozyme and TPA completed 3 days 6/03 afib with RVR placed on amio gtt overnight , 1U PRBC for Hb 7.1  6/05 extubated successfully, Repeat TPA/Pulmonzyme given again  6/06 acute decompensation overnight, hemorrhagic shock > to OR for left VATS with 2L hemothorax drained.  CT head with new ICH> neurosurgery consulted 6/09 More awake but  agitated on PSV weaning   Consults:  PCCM  Procedures:  5/29 Thoracentesis >> 1.5 L bloody fluid 5/30 ETT >  5/30 L pigtail chest tube >  Bronchoscopy 5/30 >>  large, thick, grey mucus plug completely occluding the left mainstem bronchus.   Significant Diagnostic Tests:  Thoracentesis left chest 5/29 >> 8K WBC, 78% WBC, protein 4.0, LDH 182 CT chest abdomen pelvis 6/6 >> large left hemithorax, patchy right lung opacities with small right effusion.  No acute abdominal process CT head 6/6 >> evolution of right MCA infarct with hemorrhagic transformation with large intraparenchymal hematoma, 2 additional small hemorrhages in the inferior cerebellum and left temporal lobe.  Trace subarachnoid blood. CT Head 6/10 >> stable hemorrhagic R MCA.  Right hemi-craniectomy with no midline shift or mass effect.  Mildly regressed small cerebellar hemorrhages.  Stable small volume intraventricular hemorrhage, no ventriculomegaly.  Trace subarachnoid hemorrhage largely resolved.   Micro Data:  RVP 4/16 neg  MRSA PCR  4/17 neg   ======== 5/29 Left pleural fluid culture > negative 5/29 left pleural fluid cytology >> No malignant cells identified. Blood and acute inflammation.  5/30 BAL >> rare candida albicans 6/06 L Pleural Fluid >> negative 6/06 BCx2 >> negative   Antimicrobials:  Cefepime 4/24 >> 4/25 Zosyn 4/28 >> 5/4 Zosyn 5/30 >> 6/5 Cefepime 6/6 >>   Interim history/subjective:  Afebrile / Tmax 97.3, WBC 20.9 Glucose range 288-345 I/O 116m UOP, +2L in 24 hours  Remains on amio, precedex, fentanyl gtts RN concerned about questionable seizure like activity, seen in eyes / face, responded to versed  Objective   Blood pressure (!) 122/59, pulse (Marland Kitchen  59, temperature (!) 97.3 F (36.3 C), resp. rate 18, height _0  (1.626 m), weight 90 kg, SpO2 99 %. CVP:  [17 mmHg-33 mmHg] 31 mmHg  Vent Mode: PRVC FiO2 (%):  [30 %] 30 % Set Rate:  [20 bmp] 20 bmp Vt Set:  [430 mL] 430 mL PEEP:  [5  cmH20] 5 cmH20 Plateau Pressure:  [14 cmH20-31 cmH20] 14 cmH20   Intake/Output Summary (Last 24 hours) at 12/26/2019 3536 Last data filed at 12/26/2019 0600 Gross per 24 hour  Intake 2617.61 ml  Output 695 ml  Net 1922.61 ml   Filed Weights   12/24/19 0500 12/25/19 0500 12/26/19 0500  Weight: 82.4 kg 85 kg 90 kg    Examination: General: chronically ill appearing adult female lying in bed in NAD on vent  HEENT: MM pink/moist, ETT, prior right crani Neuro: sedate CV: s1s2 paced rhythm, on amiodarone gtt, no m/r/g PULM: non-labored on vent, lungs bilaterally diminished  GI: soft, bsx4 active  Extremities: warm/dry, anasarca  Skin: no rashes or lesions  Assessment & Plan:   Hemorrhagic shock secondary to hemothorax.   Resolved s/p multiple transfusions and OR 6/6 for VATS. -follow serial Hgb, chest tube output  Acute Respiratory Failure with Hypoxemia Mucus Plugging, Suspected Aspiration   Left Pleural Effusion s/p Thoracentesis, L Chest Tube  -PRVC 8cc/kg as rest mode  -daily SBT / WUA as tolerated -likely will need trach based on family, note she may be trach / dialysis dependent  -chest tube per TCTS  -follow intermittent CXR   AKI secondary to shock - no evidence of obstruction on renal US. Hypernatremia. 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  Evaluted by neurosurgery who felt that CTs were stable and no surgical evacuation or EVD was warranted. -No interventions planned by NSGY.  Follow right skull exam / protective measures -neuro protective measures  -PT / OT when able  -passive ROM   Rule Out Seizure -assess EEG   DM2 with Hyperglycemia -SSI, resistant scale  -increase TF coverage to 6 units Q4 -add 8  units lantus QD -follow CBG Q4   Chronic Atrial Fibrillation s/p Pacemaker -tele monitoring  -hold eliquis with prior bleeding   -continue amiodarone   Moderate Protein Calorie Malnutrition  -TF per Nutrition   Goals of Care P: -Full Code.  Family not interested in meeting with Palliative Care   Best practice:  Diet: TF  Pain/Anxiety/Delirium protocol (if indicated): precedex, prn fentanyl, prn versed RASS target 0 to -1 VAP protocol (if indicated): yes DVT prophylaxis: SCDs GI prophylaxis: PPI Glucose control: SSI Mobility: BR Code Status: full code Family Communication: Husband updated via phone 6/11. Reviewed progressive renal failure, lasix ineffective at this point, discussed risks/benefits in detail with Mr. Kjos to include bleeding, pneumothorax and infection.  All questions answered regarding procedure.  Two person consent via phone.  Disposition:  ICU   CC Time: 34 minutes   Noe Gens, MSN, NP-C [facility] 12/26/2019, 8:20 AM   Please see [email or web address] for pager details.

## 2019-12-26 NOTE — Progress Notes (Signed)
vLTM EEG started. New leads used. Educated nurse on event button. Notified neuro

## 2019-12-26 NOTE — Progress Notes (Signed)
Nutrition Follow-up  DOCUMENTATION CODES:   Not applicable  INTERVENTION:   Tube Feeding via Cortrak:  Vital AF 1.2 to 60 ml/hr Add Pro-Stat 30 mL daily Provides 1828 kcals, 112 g of protein and 1094 mL of free water Meets 100% estimated calorie and protein needs  Add B-complex with C   NUTRITION DIAGNOSIS:   Inadequate oral intake related to acute illness as evidenced by NPO status.  Being addressed via TF   GOAL:   Patient will meet greater than or equal to 90% of their needs  Progressing  MONITOR:   Vent status, TF tolerance, Labs, Weight trends  REASON FOR ASSESSMENT:   Consult, Ventilator Enteral/tube feeding initiation and management  ASSESSMENT:   64 yo female admitted from [place] after being at [facility] in April in setting of R. MCA stroke requiring thrombectomy, developed significant brain edema requiring craniectomy. Pt developed L pleural effusion on Rehab requiring thoracentesis and ultimately required intubation due to resp failure from occlusion of L. Mainstem bronchus and large L hemothorax, bronch and chest tube placement. Pt did require Cortrak placement with TF initially but was taking po prior to readmission from Rehab. PMH DM, HTN, R. MCA stroke, SSS  5/29 Thoracentesis 5/30 Intubated, Chest tube 6/04 Cortrak placed 6/06 Left VATS with drainage of left hemothorax  Poor UOP that did not respond to lasix therapy; plan to start CVVHDF today   Pt remains on vent support, on fentanyl and precedex gtt  Vital AF 1.2 at 60 ml/hr, Pro-Stat 30 mL daily via Cortrak  Phosphorus high at 8.0 today, should improve post initiation of CRRT  Current weight 90 kg; admit weight 61.3 kg. Net +32 L per i/O flow sheet. UOP only 110 mL in 24 hours Edema worse than last exam, 3+ edema in all areas  Labs: CBGs 224-325 (ICU goal 140-180), BUN 106, Creatinine 4.21, phosphorus 8.0 (H), potassium 4.5 (wdl), sodium 131 (L) Meds: ss novolog,novolog q 4 hours, lantus,   lasix   Diet Order:   Diet Order            Diet NPO time specified  Diet effective now                 EDUCATION NEEDS:   Not appropriate for education at this time  Skin:  Skin Assessment: Skin Integrity Issues: Skin Integrity Issues:: Other (Comment) Other: fissure to buttocks, venous stasis ulcers on legs, MASD to buttocks  Last BM:  6/10 rectal tube  Height:   Ht Readings from Last 1 Encounters:  11/16/2019 5\' 4"  (1.626 m)    Weight:   Wt Readings from Last 1 Encounters:  12/26/19 90 kg    BMI:  Body mass index is 34.06 kg/m.  Estimated Nutritional Needs:   Kcal:  1525-1830 kcals  Protein:  90-120 g  Fluid:  >/= 1.6 L  Kerman Passey MS, RDN, LDN, CNSC Registered Dietitian III RD Pager Number and RD On-Call Pager Number Located in [place]

## 2019-12-26 NOTE — Plan of Care (Signed)
  Problem: Coping: Goal: Level of anxiety will decrease Outcome: Not Progressing   Problem: Elimination: Goal: Will not experience complications related to bowel motility Outcome: Not Progressing Goal: Will not experience complications related to urinary retention Outcome: Not Progressing

## 2019-12-26 NOTE — Progress Notes (Addendum)
      [address]       [place] [address]             [phone]      5 Days Post-Op Procedure(s) (LRB): Video Assisted Thoracoscopy (Vats) left , Drainage of Hemothorax (Left) Video Bronchoscopy (N/A) Subjective: Agitation over night requiring more sedation.   Objective: Vital signs in last 24 hours: Temp:  [97 F (36.1 C)-97.7 F (36.5 C)] 97.3 F (36.3 C) (06/11 0754) Pulse Rate:  [46-111] 59 (06/11 0754) Cardiac Rhythm: Atrial fibrillation (06/10 2000) Resp:  [12-31] 18 (06/11 0754) BP: (75-206)/(42-90) 122/59 (06/11 0754) SpO2:  [93 %-100 %] 99 % (06/11 0754) FiO2 (%):  [30 %] 30 % (06/11 0754) Weight:  [90 kg] 90 kg (06/11 0500)  Hemodynamic parameters for last 24 hours: CVP:  [17 mmHg-33 mmHg] 31 mmHg  Intake/Output from previous day: 06/10 0701 - 06/11 0700 In: 2771.3 [I.V.:1197.2; NG/GT:1380; IV Piggyback:194] Out: 720 [Urine:110; Stool:400; Chest Tube:210] Intake/Output this shift: No intake/output data recorded.  General appearance: sedated and intubated Heart: regular rate and rhythm, S1, S2 normal, no murmur, click, rub or gallop Lungs: clear to auscultation bilaterally Abdomen: soft, non-tender; bowel sounds normal; no masses,  no organomegaly Extremities: upper and lower extremity edema Wound: clean and dry  Lab Results: Recent Labs    12/25/19 0456 12/26/19 0344  WBC 18.5* 20.9*  HGB 7.9* 9.0*  HCT 24.2* 27.8*  PLT 101* 121*   BMET:  Recent Labs    12/25/19 0456 12/26/19 0344  NA 134* 131*  K 3.7 4.5  CL 102 100  CO2 17* 15*  GLUCOSE 201* 345*  BUN 96* 106*  CREATININE 3.93* 4.21*  CALCIUM 7.6* 7.9*    PT/INR: No results for input(s): LABPROT, INR in the last 72 hours. ABG    Component Value Date/Time   PHART 7.403 12/22/2019 0348   HCO3 22.8 12/22/2019 0348   TCO2 24 12/22/2019 0348   ACIDBASEDEF 2.0 12/22/2019 0348   O2SAT 99.0 12/22/2019 0348   CBG (last 3)  Recent Labs    12/25/19 2344  12/26/19 0351 12/26/19 0811  GLUCAP 300* 313* 288*    Assessment/Plan: S/P Procedure(s) (LRB): Video Assisted Thoracoscopy (Vats) left , Drainage of Hemothorax (Left) Video Bronchoscopy (N/A)  1. POD5 Left VATs, drainage of hemothorax. Chest tubes put out 210cc/24 hours.  2. bronchial washings on the day of surgery show no organisms / no growth at 2 days. Continue cefepime 3. H and H 8.5/25.5, expected acute blood loss anemia 4. Renal-AKI, [place] ordered this morning for hypokalemia 5. Uncontrolled diabetes mellitus-blood glucose > 200. Defer to medicine.  Plan: Keep chest tubes in for now. Can probably stop or decrease free water flushes. Will discontinue central line since she has a PICC. For CRRT today. No plans for extubation-continue neuro protective measures.     LOS: 12 days    Elgie Collard 12/26/2019   Patient examined and today's chest x-ray image personally reviewed.  Patient's condition discussed with daughter at bedside.  He is quite volume overloaded.  Chest tube drainage remains about 200 cc/day mainly serous.  Dialysis restarting.  Will leave chest tubes in place to prevent reaccumulation of extravascular fluid in her pleural space.  patient examined and medical record reviewed,agree with above note. Tharon Aquas Trigt III 12/26/2019

## 2019-12-26 NOTE — Progress Notes (Addendum)
[facility] NEPHROLOGY PROGRESS NOTE  Assessment/ Plan: Pt is a 64 y.o. yo female with history of HTN, DM, chronic A. Fib on Coumadin, SSS status post pacemaker, MVR, recent stroke in 10/2019 and then stay in CIR. she developed respiratory distress, enlarging pleural effusion and underwent thoracocentesis.  She was intubated and the loculated effusion was treated with TPA which was complicated by hemorrhagic shock requiring pressors.  Status post VATS on 6/6 and hemoglobin dropped to four.  We are consulted to manage acute kidney injury and decreasing urine output.  #Acute kidney injury, now oligo-anuric: Probably due to ischemic ATN in the setting of hemorrhagic shock and pressor support.  Did not respond with IV Lasix and she has fluid overload.  Plan to start CVVHDF today after placement of the catheter.  Discussed with the patient's husband over the phone and he agreed to proceed with dialysis.  Also discussed with him that the kidney function may or may not improve depending on the clinical course over the next few days to weeks.  Also discussed with ICU team.  Blood pressure is borderline.  Currently not on pressor. Avoid heparin.  #Hemorrhagic shock due to hemothorax status post multiple transfusion.  Hemoglobin now stabilized.  #Acute respiratory failure with hypoxia: Hemothorax and mucous plugging seen on bronchoscopy.  Currently on ventilator by PCCM.   # Anemia, acute blood loss: Received blood transfusion.  Hemoglobin 9 today.  #Chronic A. fib status post pacemaker: Anticoagulation on hold  #Metabolic acidosis: Starting CRRT as above.  Subjective: Seen and examined in ICU.  Intubated and sedated.  Urine output only 110 cc with IV Lasix.  No new event. Objective Vital signs in last 24 hours: Vitals:   12/26/19 0338 12/26/19 0400 12/26/19 0500 12/26/19 0600  BP: (!) 115/59 (!) 206/61 (!) 100/52 (!) 102/52  Pulse: 60 72 (!) 59 (!) 59  Resp: (!) 25 (!) _0 Temp:   (!) 97.5 F (36.4 C) (!) 97.5 F (36.4 C) (!) 97.3 F (36.3 C)  TempSrc:      SpO2: 99% 96% 99% 100%  Weight:   90 kg   Height:       Weight change: 5 kg  Intake/Output Summary (Last 24 hours) at 12/26/2019 0736 Last data filed at 12/26/2019 0600 Gross per 24 hour  Intake 2771.25 ml  Output 720 ml  Net 2051.25 ml       Labs: Basic Metabolic Panel: Recent Labs  Lab 12/24/19 0343 12/25/19 0456 12/26/19 0344  NA 139 134* 131*  K 3.3* 3.7 4.5  CL 107 102 100  CO2 19* 17* 15*  GLUCOSE 224* 201* 345*  BUN 87* 96* 106*  CREATININE 3.58* 3.93* 4.21*  CALCIUM 7.3* 7.6* 7.9*  PHOS 6.1* 7.2* 8.0*   Liver Function Tests: Recent Labs  Lab 12/16/2019 1856 12/23/19 0427 12/25/19 0456  AST 262* 334* 71*  ALT 94* 150* 52*  ALKPHOS 123 145* 129*  BILITOT 1.4* 0.8 0.7  PROT 5.3* 5.9* 6.0*  ALBUMIN 2.0* 2.0* 1.7*   No results for input(s): LIPASE, AMYLASE in the last 168 hours. No results for input(s): AMMONIA in the last 168 hours. CBC: Recent Labs  Lab 12/23/19 1730 12/23/19 1730 12/24/19 0343 12/24/19 0343 12/24/19 1723 12/25/19 0456 12/26/19 0344  WBC 16.8*   < > 17.3*   < > 22.9* 18.5* 20.9*  HGB 8.5*   < > 8.5*   < > 9.6* 7.9* 9.0*  HCT 25.5*   < > 25.5*   < >  29.7* 24.2* 27.8*  MCV 89.2  --  89.8  --  90.8 91.3 91.1  PLT 98*   < > 103*   < > 123* 101* 121*   < > = values in this interval not displayed.   Cardiac Enzymes: No results for input(s): CKTOTAL, CKMB, CKMBINDEX, TROPONINI in the last 168 hours. CBG: Recent Labs  Lab 12/25/19 1126 12/25/19 1600 12/25/19 1938 12/25/19 2344 12/26/19 0351  GLUCAP 245* 270* 325* 300* 313*    Iron Studies: No results for input(s): IRON, TIBC, TRANSFERRIN, FERRITIN in the last 72 hours. Studies/Results: CT HEAD WO CONTRAST  Result Date: 12/25/2019 CLINICAL DATA:  64 year old female status post right MCA infarct, hemi craniectomy. EXAM: CT HEAD WITHOUT CONTRAST TECHNIQUE: Contiguous axial images were obtained  from the base of the skull through the vertex without intravenous contrast. COMPARISON:  Head CT 12/26/2019 and earlier. FINDINGS: Brain: Small hemorrhages in the bilateral cerebellum appear mildly regressed, with slightly less edema around larger left side hemorrhage. No posterior fossa mass effect. Right MCA mixed density infarct with hemorrhage in the temporal and inferior parietal lobe appears stable. Small volume intraventricular hemorrhage is stable. No ventriculomegaly, but there is mildly increased ex vacuo enlargement of the right frontal horn. Regressed small volume contralateral left side subarachnoid hemorrhage. Basilar cisterns are normal. No significant midline shift. Stable gray-white matter differentiation elsewhere. Vascular: Calcified atherosclerosis at the skull base. Skull: Stable right hemi craniectomy. Sinuses/Orbits: Partially visible right nasoenteric tube. Bubbly opacity in the pharynx. Visualized paranasal sinuses and mastoids are stable and well pneumatized. Other: Stable orbit and scalp soft tissues. IMPRESSION: 1. Stable hemorrhagic Right MCA infarct since 01/04/2020. Right hemi-craniectomy with no midline shift or significant intracranial mass effect. 2. Mildly regressed small cerebellar hemorrhages. No posterior fossa mass effect. 3. Stable small volume intraventricular hemorrhage, no ventriculomegaly. Trace subarachnoid hemorrhage largely resolved. 4. No new intracranial abnormality. Electronically Signed   By: Genevie Ann M.D.   On: 12/25/2019 15:19   DG Chest Port 1 View  Result Date: 12/25/2019 CLINICAL DATA:  Check endotracheal tube placement EXAM: PORTABLE CHEST 1 VIEW COMPARISON:  12/24/2019 FINDINGS: Endotracheal tube, feeding catheter and right jugular sheath are again seen and stable. Three left sided chest tubes are noted. No pneumothorax is noted. Lateral density is noted stable from the prior exam likely representing some loculated fluid. Cardiac shadow is stable. Spacing  device is noted. Slight increased density is noted over the bases bilaterally consistent with small effusions. No new focal confluent infiltrate is seen. IMPRESSION: Overall appearance is stable from the prior study. Electronically Signed   By: Inez Catalina M.D.   On: 12/25/2019 09:30    Medications: Infusions: . sodium chloride    . sodium chloride    . sodium chloride    . amiodarone 30 mg/hr (12/26/19 0600)  . ceFEPime (MAXIPIME) IV    . dexmedetomidine (PRECEDEX) IV infusion 0.8 mcg/kg/hr (12/26/19 0600)  . feeding supplement (VITAL AF 1.2 CAL) 60 mL/hr at 12/26/19 0600  . fentaNYL infusion INTRAVENOUS 125 mcg/hr (12/26/19 0600)  . furosemide Stopped (12/26/19 0232)  . norepinephrine (LEVOPHED) Adult infusion 1 mcg/min (01/12/2020 1712)  . vasopressin (PITRESSIN) infusion - *FOR SHOCK* Stopped (12/23/19 1916)    Scheduled Medications: . amantadine  100 mg Per Tube BID  . aspirin  325 mg Per Tube Daily  . atorvastatin  40 mg Per Tube Daily  . chlorhexidine gluconate (MEDLINE KIT)  15 mL Mouth Rinse BID  . Chlorhexidine Gluconate Cloth  6 each  Topical Q0600  . docusate  100 mg Per Tube BID  . feeding supplement (PRO-STAT SUGAR FREE 64)  30 mL Per Tube Daily  . fentaNYL (SUBLIMAZE) injection  25 mcg Intravenous Once  . free water  200 mL Per Tube Q4H  . insulin aspart  0-20 Units Subcutaneous Q4H  . insulin aspart  4 Units Subcutaneous Q4H  . mouth rinse  15 mL Mouth Rinse 10 times per day  . pantoprazole (PROTONIX) IV  40 mg Intravenous Q24H  . polyethylene glycol  17 g Per Tube Daily  . senna-docusate  1 tablet Per Tube BID  . sodium chloride flush  10-40 mL Intracatheter Q12H    have reviewed scheduled and prn medications.  Physical Exam: General: Intubated, sedated Heart:RRR, s1s2 nl, no rubs Lungs: Coarse breath sound bilateral Abdomen:soft, Non-tender, non-distended Extremities: Bilateral lower extremity edema present Dialysis Access: Not yet present.  Archer Vise Tanna Furry 12/26/2019,7:36 AM  LOS: 12 days  Pager: [phone]

## 2019-12-27 ENCOUNTER — Inpatient Hospital Stay (HOSPITAL_COMMUNITY): Payer: Medicare Other

## 2019-12-27 DIAGNOSIS — Z992 Dependence on renal dialysis: Secondary | ICD-10-CM

## 2019-12-27 DIAGNOSIS — G40901 Epilepsy, unspecified, not intractable, with status epilepticus: Secondary | ICD-10-CM

## 2019-12-27 LAB — CBC
HCT: 24.4 % — ABNORMAL LOW (ref 36.0–46.0)
Hemoglobin: 8 g/dL — ABNORMAL LOW (ref 12.0–15.0)
MCH: 29.7 pg (ref 26.0–34.0)
MCHC: 32.8 g/dL (ref 30.0–36.0)
MCV: 90.7 fL (ref 80.0–100.0)
Platelets: 128 10*3/uL — ABNORMAL LOW (ref 150–400)
RBC: 2.69 MIL/uL — ABNORMAL LOW (ref 3.87–5.11)
RDW: 17.5 % — ABNORMAL HIGH (ref 11.5–15.5)
WBC: 18.3 10*3/uL — ABNORMAL HIGH (ref 4.0–10.5)
nRBC: 0.1 % (ref 0.0–0.2)

## 2019-12-27 LAB — RENAL FUNCTION PANEL
Albumin: 1.5 g/dL — ABNORMAL LOW (ref 3.5–5.0)
Albumin: 1.6 g/dL — ABNORMAL LOW (ref 3.5–5.0)
Anion gap: 11 (ref 5–15)
Anion gap: 12 (ref 5–15)
BUN: 70 mg/dL — ABNORMAL HIGH (ref 8–23)
BUN: 51 mg/dL — ABNORMAL HIGH (ref 8–23)
CO2: 21 mmol/L — ABNORMAL LOW (ref 22–32)
CO2: 21 mmol/L — ABNORMAL LOW (ref 22–32)
Calcium: 7.8 mg/dL — ABNORMAL LOW (ref 8.9–10.3)
Calcium: 7.7 mg/dL — ABNORMAL LOW (ref 8.9–10.3)
Chloride: 102 mmol/L (ref 98–111)
Chloride: 101 mmol/L (ref 98–111)
Creatinine, Ser: 2.91 mg/dL — ABNORMAL HIGH (ref 0.44–1.00)
Creatinine, Ser: 2.29 mg/dL — ABNORMAL HIGH (ref 0.44–1.00)
GFR calc Af Amer: 19 mL/min — ABNORMAL LOW (ref >60)
GFR calc Af Amer: 25 mL/min — ABNORMAL LOW (ref >60)
GFR calc non Af Amer: 16 mL/min — ABNORMAL LOW (ref >60)
GFR calc non Af Amer: 22 mL/min — ABNORMAL LOW (ref >60)
Glucose, Bld: 197 mg/dL — ABNORMAL HIGH (ref 70–99)
Glucose, Bld: 143 mg/dL — ABNORMAL HIGH (ref 70–99)
Phosphorus: 5.2 mg/dL — ABNORMAL HIGH (ref 2.5–4.6)
Phosphorus: 3.9 mg/dL (ref 2.5–4.6)
Potassium: 3.9 mmol/L (ref 3.5–5.1)
Potassium: 3.7 mmol/L (ref 3.5–5.1)
Sodium: 134 mmol/L — ABNORMAL LOW (ref 135–145)
Sodium: 134 mmol/L — ABNORMAL LOW (ref 135–145)

## 2019-12-27 LAB — MAGNESIUM: Magnesium: 2.3 mg/dL (ref 1.7–2.4)

## 2019-12-27 LAB — GLUCOSE, CAPILLARY
Glucose-Capillary: 121 mg/dL — ABNORMAL HIGH (ref 70–99)
Glucose-Capillary: 132 mg/dL — ABNORMAL HIGH (ref 70–99)
Glucose-Capillary: 146 mg/dL — ABNORMAL HIGH (ref 70–99)
Glucose-Capillary: 160 mg/dL — ABNORMAL HIGH (ref 70–99)
Glucose-Capillary: 166 mg/dL — ABNORMAL HIGH (ref 70–99)
Glucose-Capillary: 172 mg/dL — ABNORMAL HIGH (ref 70–99)
Glucose-Capillary: 176 mg/dL — ABNORMAL HIGH (ref 70–99)

## 2019-12-27 LAB — CALCIUM, IONIZED: Calcium, Ionized, Serum: 4.1 mg/dL — ABNORMAL LOW (ref 4.5–5.6)

## 2019-12-27 MED ORDER — SODIUM CHLORIDE 0.9 % IV SOLN
1500.0000 mg | Freq: Once | INTRAVENOUS | Status: AC
  Administered 2019-12-27: 1500 mg via INTRAVENOUS
  Filled 2019-12-27: qty 30

## 2019-12-27 MED ORDER — LEVETIRACETAM IN NACL 1000 MG/100ML IV SOLN
1000.0000 mg | Freq: Once | INTRAVENOUS | Status: AC
  Administered 2019-12-27: 1000 mg via INTRAVENOUS
  Filled 2019-12-27: qty 100

## 2019-12-27 MED ORDER — LEVETIRACETAM IN NACL 1000 MG/100ML IV SOLN
1000.0000 mg | Freq: Two times a day (BID) | INTRAVENOUS | Status: DC
  Administered 2019-12-27: 1000 mg via INTRAVENOUS
  Filled 2019-12-27: qty 100

## 2019-12-27 MED ORDER — PHENYTOIN SODIUM 50 MG/ML IJ SOLN
100.0000 mg | Freq: Three times a day (TID) | INTRAMUSCULAR | Status: DC
  Administered 2019-12-27 – 2020-01-15 (×56): 100 mg via INTRAVENOUS
  Filled 2019-12-27 (×57): qty 2

## 2019-12-27 MED ORDER — LEVETIRACETAM IN NACL 500 MG/100ML IV SOLN
500.0000 mg | Freq: Two times a day (BID) | INTRAVENOUS | Status: DC
  Administered 2019-12-28 – 2019-12-29 (×3): 500 mg via INTRAVENOUS
  Filled 2019-12-27 (×3): qty 100

## 2019-12-27 NOTE — Progress Notes (Addendum)
Delayed entry.  LTM maint complete - no skin breakdown under: Fp1 Fp2 F3 F4 A1

## 2019-12-27 NOTE — Progress Notes (Signed)
6 Days Post-Op Procedure(s) (LRB): Video Assisted Thoracoscopy (Vats) left , Drainage of Hemothorax (Left) Video Bronchoscopy (N/A) Subjective: Intubated, sedated  Objective: Vital signs in last 24 hours: Temp:  [94.8 F (34.9 C)-98.4 F (36.9 C)] 96.6 F (35.9 C) (06/12 0830) Pulse Rate:  [51-91] 61 (06/12 0830) Cardiac Rhythm: Normal sinus rhythm;Ventricular paced (06/12 0800) Resp:  [0-29] 19 (06/12 0830) BP: (83-179)/(44-78) 100/56 (06/12 0830) SpO2:  [93 %-100 %] 100 % (06/12 0830) FiO2 (%):  [30 %] 30 % (06/12 0800) Weight:  [88.9 kg] 88.9 kg (06/12 0149)  Hemodynamic parameters for last 24 hours: CVP:  [14 mmHg-26 mmHg] 14 mmHg  Intake/Output from previous day: 06/11 0701 - 06/12 0700 In: 3200 [I.V.:1140; NG/GT:1760; IV Piggyback:300] Out: 3473 [Urine:112; Stool:375; Chest Tube:220] Intake/Output this shift: Total I/O In: 111.9 [I.V.:38.9; NG/GT:60; IV Piggyback:13.1] Out: 0   General appearance: intubated Neurologic: sedated Heart: regular rate and rhythm Lungs: clear anteriorly serous drainage from CT  Lab Results: Recent Labs    12/26/19 0344 12/27/19 0357  WBC 20.9* 18.3*  HGB 9.0* 8.0*  HCT 27.8* 24.4*  PLT 121* 128*   BMET:  Recent Labs    12/26/19 1530 12/27/19 0357  NA 133* 134*  K 4.0 3.9  CL 102 102  CO2 16* 21*  GLUCOSE 262* 197*  BUN 105* 70*  CREATININE 4.13* 2.91*  CALCIUM 7.7* 7.8*    PT/INR: No results for input(s): LABPROT, INR in the last 72 hours. ABG    Component Value Date/Time   PHART 7.403 12/22/2019 0348   HCO3 22.8 12/22/2019 0348   TCO2 24 12/22/2019 0348   ACIDBASEDEF 2.0 12/22/2019 0348   O2SAT 99.0 12/22/2019 0348   CBG (last 3)  Recent Labs    12/27/19 0051 12/27/19 0345 12/27/19 0813  GLUCAP 121* 176* 166*    Assessment/Plan: S/P Procedure(s) (LRB): Video Assisted Thoracoscopy (Vats) left , Drainage of Hemothorax (Left) Video Bronchoscopy (N/A) - Chest tubes drained 220 ml yesterday, CXR  stable Leave CT in place  LOS: 13 days    Melrose Nakayama 12/27/2019

## 2019-12-27 NOTE — Progress Notes (Signed)
NAME:  Krystal Little, MRN:  [id number], DOB:  08-22-1955, LOS: 41 ADMISSION DATE:  12/10/2019, CONSULTATION DATE:  5/29 REFERRING MD:  Letta Pate, CHIEF COMPLAINT:  Chest congestion   Brief History   33 female with an extensive cardiac history was admitted to [facility] in April in the setting of a right MCA stroke, had a right interventional radiology guided thrombectomy, later had significant brain edema and underwent craniectomy, ultimately transferred to inpatient rehab.  Pulmonary and critical care medicine was consulted on May 29 in the setting of tachypnea and an enlarging left-sided pleural effusion. Thoracentesis was performed which showed a hemothorax.  A CT chest was then ordered showing obstruction of her left mainstem bronchus and a loculated left pleural effusion.  On May 30 she was moved to the ICU for chest tube placement and bronchoscopy.   Past Medical History  Status post mitral valve repair 2001 Status post cardiac pacemaker 2001 History of sick sinus syndrome Hypertension Diabetes mellitus type 2 Chronic atrial fibrillation Right MCA stroke 2021  Significant Hospital Events   4/16 IR thrombectomy R MCA stroke 4/25 PCCM sign off 5/6 transfer to [facility] 5/29 PCCM consult left pleural effusion, thoracentesis 5/30 worsening tachypnea. Move to ICU for intubation to facilitate bronchoscopy and chest tube placement 5/31 TPA/ pulmonzyme started for 3 days for persistent left effusion 6/01 Afebrile, low-dose Levophed, Precedex, Minimal output on chest tube- pulmozyme / tpa x 2,  6/02 Weaned x 7 hours yesterday PSV 15/5, pulmozyme and TPA completed 3 days 6/03 afib with RVR placed on amio gtt overnight , 1U PRBC for Hb 7.1  6/05 extubated successfully, Repeat TPA/Pulmonzyme given again  6/06 acute decompensation overnight, hemorrhagic shock > to OR for left VATS with 2L hemothorax drained.  CT head with new ICH> neurosurgery consulted 6/09 More awake but  agitated on PSV weaning  6/12: Seizures + on EEG   Consults:  PCCM  Procedures:  5/29 Thoracentesis >> 1.5 L bloody fluid 5/30 ETT >  5/30 L pigtail chest tube >  Bronchoscopy 5/30 >>  large, thick, grey mucus plug completely occluding the left mainstem bronchus.   Significant Diagnostic Tests:  Thoracentesis left chest 5/29 >> 8K WBC, 78% WBC, protein 4.0, LDH 182 CT chest abdomen pelvis 6/6 >> large left hemithorax, patchy right lung opacities with small right effusion.  No acute abdominal process CT head 6/6 >> evolution of right MCA infarct with hemorrhagic transformation with large intraparenchymal hematoma, 2 additional small hemorrhages in the inferior cerebellum and left temporal lobe.  Trace subarachnoid blood. CT Head 6/10 >> stable hemorrhagic R MCA.  Right hemi-craniectomy with no midline shift or mass effect.  Mildly regressed small cerebellar hemorrhages.  Stable small volume intraventricular hemorrhage, no ventriculomegaly.  Trace subarachnoid hemorrhage largely resolved.  6/12 EEG: right frontocentral seizures X2   Micro Data:   RVP 4/16 neg  MRSA PCR  4/17 neg   ======== 5/29 Left pleural fluid culture > negative 5/29 left pleural fluid cytology >> No malignant cells identified. Blood and acute inflammation.  5/30 BAL >> rare candida albicans 6/06 L Pleural Fluid >> negative 6/06 BCx2 >> negative   Antimicrobials:  Cefepime 4/24 >> 4/25 Zosyn 4/28 >> 5/4 Zosyn 5/30 >> 6/5 Cefepime 6/6 >> 6/12  Interim history/subjective:   Seizures overnight.  Loaded with Keppra yesterday evening.  EEG this morning with 2 seizures and rhythmic activity.  Patient seen by neurology.  Husband updated at bedside this morning.  Objective  Blood pressure (!) 99/54, pulse (!) 57, temperature (!) 96.3 F (35.7 C), resp. rate 19, height _0  (1.626 m), weight 88.9 kg, SpO2 100 %. CVP:  [14 mmHg-26 mmHg] 14 mmHg  Vent Mode: PRVC FiO2 (%):  [30 %] 30 % Set Rate:  [20 bmp] 20  bmp Vt Set:  [430 mL] 430 mL PEEP:  [5 cmH20] 5 cmH20 Plateau Pressure:  [15 cmH20-22 cmH20] 21 cmH20   Intake/Output Summary (Last 24 hours) at 12/27/2019 1011 Last data filed at 12/27/2019 1000 Gross per 24 hour  Intake 3052.32 ml  Output 3413 ml  Net -360.68 ml   Filed Weights   12/25/19 0500 12/26/19 0500 12/27/19 0149  Weight: 85 kg 90 kg 88.9 kg    Examination: General: Elderly female intubated on mechanical life support HEENT: Status post right-sided hemicraniectomy, intubated on mechanical life support, endotracheal tube in place Neuro: Sedated on mechanical support, minimal response CV: Regular rate rhythm, S1-S2, paced PULM: Bilateral ventilated breath sounds GI: Soft, nontender nondistended Extremities: Third spaced edema upper and lower extremities dependent Skin: No rash  Assessment & Plan:   Hemorrhagic shock secondary to hemothorax.   This is now resolved status post multiple transfusions and taken to the OR 6 6 for VATS by cardiothoracic surgery. -Continue to follow serial hemoglobin and chest tube output.  Acute Respiratory Failure with Hypoxemia Mucus Plugging, Suspected Aspiration   Left Pleural Effusion s/p Thoracentesis, L Chest Tube  -Patient remains on adult ventilator protocol -Continue PRVC at 8 cc/kg -Daily SBT SAT as tolerated. -Chest tube management per T TTS -Follow intermittent chest x-ray. -Discussed potential need for prolonged mechanical ventilation and tracheostomy pending patient's recovery.  I do suspect her mental status for some time will preclude liberation from the vent.  AKI secondary to shock - no evidence of obstruction on renal US. Hypernatremia. Nephrology's assistance -Continue CVVHD per nephrology -Continue to follow BMP and urine output as tolerated Avoid nephrotoxic agents.  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  Evaluted by neurosurgery  who felt that CTs were stable and no surgical evacuation or EVD was warranted. -Neurosurgery plans with no intervention at this time -Continue to watch left hemicraniectomy site for protective measures -PT OT once able  Status Epilepticus, likely related to underlying stroke history  EEG -Keppra loaded yesterday -Neurology consulted, ED management per Dr. Leonel Ramsay -We appreciate their input  DM2 with Hyperglycemia -Continue SSI with sliding scale -Increase tube feed coverage yesterday -Continue Lantus -Follow CBGs  Chronic Atrial Fibrillation s/p Pacemaker -Telemetry monitoring -Holding Eliquis due to bleeding as above  Moderate Protein Calorie Malnutrition  Feeds per nutrition  Goals of Care P: -Patient remains full code -Patient's family not interested in discussing palliative care at this time.   Best practice:  Diet: TF  Pain/Anxiety/Delirium protocol (if indicated): precedex, prn fentanyl, prn versed RASS target 0 to -1 VAP protocol (if indicated): yes DVT prophylaxis: SCDs GI prophylaxis: PPI Glucose control: SSI Mobility: BR Code Status: full code Family Communication: I myself as well as neurology updated patient's husband at bedside 12/27/2019 on rounds. Disposition:  ICU  This patient is critically ill with multiple organ system failure; which, requires frequent high complexity decision making, assessment, support, evaluation, and titration of therapies. This was completed through the application of advanced monitoring technologies and extensive interpretation of multiple databases. During this encounter critical care time was devoted to patient care services described in this note  for 36 minutes.  Garner Nash, DO [facility] 12/27/2019 10:11 AM

## 2019-12-27 NOTE — Progress Notes (Signed)
Head twitching with staring noted at 0521.  Ativan given at 0526 but twitching had stopped at that time once RN got back to room with ativan.

## 2019-12-27 NOTE — Plan of Care (Signed)
  Problem: Clinical Measurements: Goal: Respiratory complications will improve Outcome: Progressing   Problem: Clinical Measurements: Goal: Cardiovascular complication will be avoided Outcome: Progressing   Problem: Nutrition: Goal: Adequate nutrition will be maintained Outcome: Progressing   

## 2019-12-27 NOTE — Consult Note (Signed)
Neurology Consultation Reason for Consult: Seizures Referring Physician: Icard, B  CC: Seizures  History is obtained from:patient  HPI: Krystal Little is a 64 y.o. 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  While at rehab, she developed an enlarging left-sided pleural effusion and was transferred back to the intensive care unit.  Thoracentesis demonstrated hemothorax and a chest tube was placed on May 30.  She developed severe anemia with a hemoglobin of 4.3 and severe hypotension on 6/6. Head CT that day shows interval hemorrhage into the stroke bed. On 6/11 she was noted to have seizure like activity described as right upper extremity rhythmic involuntary contractions with upper gaze and lipsmacking.  This resolved with Versed started on continuous EEG which reveals two seizures overnight.    ROS:  Unable to obtain due to altered mental status.   Past Medical History:  Diagnosis Date  . Chronic atrial fibrillation ([place]) 1990s   s/p DCCV then attempted ablation of complex A Flutter (@ [facility] - Dr. Deno Etienne), failed antiarrhythmics --> INR folllwed @ [facility] ->> now status post pacemaker placement with underlying A. fib.  . Diabetes mellitus type 2, uncontrolled, without complications    On Oral Medications ([facility])  . Essential hypertension   . History of cardiac catheterization 2001   R&LHC - normal Coronaries, no evidence of Restictive Cardiomyopathy or Constrictive Pericarditis (also @ [facility])  . Hx of sick sinus syndrome 07/1999   Wtih symptomatic bradycardia - syncope ([place])  . 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 Hypertension Mother   . Diabetes Sister   . Cancer Brother        Rectal  . Lung cancer Brother      Social History:  reports that she has never smoked. She has never used smokeless tobacco. She reports current alcohol use. She reports that she does not use drugs.   Exam: Current vital signs: BP (!) 99/54   Pulse (!) 57   Temp (!) 96.3 F (35.7 C)   Resp 19   Ht 5\' 4"  (1.626 m)   Wt 88.9 kg   SpO2 100%   BMI 33.64 kg/m  Vital signs in last 24 hours: Temp:  [94.8 F (34.9 C)-98.4 F (36.9 C)] 96.3 F (35.7 C) (06/12 1000) Pulse Rate:  [51-91] 57 (06/12 1000) Resp:  [0-29] 19 (06/12 1000) BP: (83-179)/(44-78) 99/54 (06/12 1000) SpO2:  [93 %-100 %] 100 % (06/12 1000) FiO2 (%):  [30 %] 30 % (06/12 0800) Weight:  [88.9 kg] 88.9 kg (06/12 0149)   Physical Exam  Constitutional: Appears well-developed and well-nourished.  Psych: unresponsive Eyes: No scleral injection HENT: Intubated MSK: no joint deformities.  Cardiovascular: Normal rate and regular rhythm.  Respiratory: Ventilated GI: Soft.  No distension. There is no tenderness.  Skin: WDI  Neuro: Mental Status: Eyes open, but she does not follow commands Cranial Nerves: II: She does not blink to threat pupils are equal,  round, and reactive to light.   III,IV, VI: VOR intact V: VII: Corneal intact on the right, weak on the left X: Cough intact Motor: She does not move to noxious stimulation in any extremity Sensory: As above  Cerebellar: Does not perform   I have reviewed labs in epic and the results pertinent to this consultation are: Creatinine 2.91  I have reviewed the images obtained: CT with hemorrhagic right MCA infarct  Impression: 64 year old female with recent hemorrhagic infarct is now developed seizures.   She has been started on Keppra but she still has considerable irritability and therefore I will add fosphenytoin as well.  Recommendations: 1) Keppra 500 twice daily (renally dosed) 2) phenytoin 100 3 times daily following fosphenytoin load 3) continue LTM EEG  This patient is critically ill and at significant risk of neurological worsening, death and care requires constant monitoring of vital signs, hemodynamics,respiratory and cardiac monitoring, neurological assessment, discussion with family, other specialists and medical decision making of high complexity. I spent 45 minutes of neurocritical care time  in the care of  this patient. This was time spent independent of any time provided by nurse practitioner or PA.  Roland Rack, MD [facility] [phone]  If 7pm- 7am, please page neurology on call as listed in [place]. 12/27/2019  9:29 PM

## 2019-12-27 NOTE — Progress Notes (Signed)
Patient vomited large amount of tube feed. Tube feeds stopped. Cortrax flushed and clamped. Will notify CCM on evening rounds.

## 2019-12-27 NOTE — Procedures (Addendum)
Patient Name: Krystal Little  MRN: [id number]  Epilepsy Attending: Lora Havens  Referring Physician/Provider: Noe Gens, NP Duration: 12/26/2019 1447 to 12/27/2019 1447  Patient history: 64 year old female with right temporal and posterior fossa hematoma s/p hemicraniectomy who was noted to have seizure-like activity.  EEG evaluate for seizures.  Level of alertness: comatose  AEDs during EEG study: LEV  Technical aspects: This EEG study was done with scalp electrodes positioned according to the 10-20 International system of electrode placement. Electrical activity was acquired at a sampling rate of 500Hz  and reviewed with a high frequency filter of 70Hz  and a low frequency filter of 1Hz . EEG data were recorded continuously and digitally stored.   Description: EEG showed continuous generalized and lateralized right hemisphere 3 to 5 Hz theta-delta slowing.  Sharp waves were also seen in the right frontocentral region which at times appear rhythmic when patient is stimulated.  Hyperventilation and photic stimulation were not performed.     Event button was pressed on 12/26/2019 at 2332 and on 12/27/2019 0521 during which patient was noted to have left side semi- rhythmic head jerking with eyes open, lasting about 10 mins. Concomitant eeg showed rhythmic 4-6hz  theta slowing admixed with sharp waves which gradually evolved to involve bifrontal region.   ABNORMALITY - Focal seizure, right frontocentral region - Sharp waves, right frontocentral region - Continuous slow, generalized and lateralized right hemisphere  IMPRESSION: This study showed two seizures arising from right frontocentral region during which patient was noted to have left side semi- rhythmic head jerking with eyes open, on 12/26/2019 at 2332 and on 12/27/2019 0521, lasting about 10 mins. Additionally, there is cortical dysfunction in the right hemisphere consistent with underlying craniotomy as well as severe diffuse  encephalopathy, nonspecific to etiology.    Krystal Little

## 2019-12-27 NOTE — Progress Notes (Signed)
Krystal Little  Assessment/ Plan: Pt is a 64 y.o. yo female with history of HTN, DM, chronic A. Fib on Coumadin, SSS status post pacemaker, MVR, recent stroke in 10/2019 and then stay in CIR. she developed respiratory distress, enlarging pleural effusion and underwent thoracocentesis.  She was intubated and the loculated effusion was treated with TPA which was complicated by hemorrhagic shock requiring pressors.  Status post VATS on 6/6 and hemoglobin dropped to four.  We are consulted to manage acute kidney injury and decreasing urine output.  #Acute kidney injury, now oligo-anuric: Probably due to ischemic ATN in the setting of hemorrhagic shock and pressor support.  Refractory to IV diuretics and volume up therefore initiated CVVHDF on 6/11.  Left IJ temporary HD catheter was placed by PCCM. She is on low-dose Levophed and tolerating CRRT well.  Had to change filter after 24 hours, not using heparin because of hemorrhage.  Plan to continue CRRT today, 4K and UF as tolerated.  Discussed with ICU nurse.  #Hemorrhagic shock due to hemothorax status post multiple transfusion.  Monitor hemoglobin.  On low-dose Levophed.  #Acute respiratory failure with hypoxia: Hemothorax and mucous plugging seen on bronchoscopy.  Currently on ventilator by PCCM.  # Anemia, acute blood loss: Received blood transfusion.  Trend hemoglobin, per ICU team.  #Chronic A. fib status post pacemaker: Anticoagulation on hold  #Metabolic acidosis: Continue CRRT as above.  #Hemorrhagic right MCA infarction.  Per neurology.  No heparin.  Subjective: Seen and examined in ICU.  Remains intubated.  Noted she had some twitching movement.  Neurology is following.  Urine output is recorded 112 cc.  Requiring low-dose Levophed around 2 mics.  No family member at bedside.  Objective Vital signs in last 24 hours: Vitals:   12/27/19 0645 12/27/19 0700 12/27/19 0715 12/27/19 0732  BP: 99/60 (!)  119/57    Pulse: 60 65 69 71  Resp: (!) 24 (!) 25 20 (!) 24  Temp:  (!) 97.2 F (36.2 C) (!) 97.2 F (36.2 C)   TempSrc:      SpO2: 100% 100% 100% 100%  Weight:      Height:       Weight change: -1.1 kg  Intake/Output Summary (Last 24 hours) at 12/27/2019 0741 Last data filed at 12/27/2019 0700 Gross per 24 hour  Intake 3199.95 ml  Output 3473 ml  Net -273.05 ml       Labs: Basic Metabolic Panel: Recent Labs  Lab 12/26/19 0344 12/26/19 1530 12/27/19 0357  NA 131* 133* 134*  K 4.5 4.0 3.9  CL 100 102 102  CO2 15* 16* 21*  GLUCOSE 345* 262* 197*  BUN 106* 105* 70*  CREATININE 4.21* 4.13* 2.91*  CALCIUM 7.9* 7.7* 7.8*  PHOS 8.0* 8.0* 5.2*   Liver Function Tests: Recent Labs  Lab 01/03/2020 1856 12/27/2019 1856 12/23/19 0427 12/23/19 0427 12/25/19 0456 12/26/19 1530 12/27/19 0357  AST 262*  --  334*  --  71*  --   --   ALT 94*  --  150*  --  52*  --   --   ALKPHOS 123  --  145*  --  129*  --   --   BILITOT 1.4*  --  0.8  --  0.7  --   --   PROT 5.3*  --  5.9*  --  6.0*  --   --   ALBUMIN 2.0*   < > 2.0*   < > 1.7* 1.5*  1.5*   < > = values in this interval not displayed.   No results for input(s): LIPASE, AMYLASE in the last 168 hours. No results for input(s): AMMONIA in the last 168 hours. CBC: Recent Labs  Lab 12/24/19 0343 12/24/19 0343 12/24/19 1723 12/24/19 1723 12/25/19 0456 12/26/19 0344 12/27/19 0357  WBC 17.3*   < > 22.9*   < > 18.5* 20.9* 18.3*  HGB 8.5*   < > 9.6*   < > 7.9* 9.0* 8.0*  HCT 25.5*   < > 29.7*   < > 24.2* 27.8* 24.4*  MCV 89.8  --  90.8  --  91.3 91.1 90.7  PLT 103*   < > 123*   < > 101* 121* 128*   < > = values in this interval not displayed.   Cardiac Enzymes: No results for input(s): CKTOTAL, CKMB, CKMBINDEX, TROPONINI in the last 168 hours. CBG: Recent Labs  Lab 12/26/19 1235 12/26/19 1601 12/26/19 1950 12/27/19 0051 12/27/19 0345  GLUCAP 305* 221* 166* 121* 176*    Iron Studies: No results for input(s):  IRON, TIBC, TRANSFERRIN, FERRITIN in the last 72 hours. Studies/Results: CT HEAD WO CONTRAST  Result Date: 12/25/2019 CLINICAL DATA:  64 year old female status post right MCA infarct, hemi craniectomy. EXAM: CT HEAD WITHOUT CONTRAST TECHNIQUE: Contiguous axial images were obtained from the base of the skull through the vertex without intravenous contrast. COMPARISON:  Head CT 12/18/2019 and earlier. FINDINGS: Brain: Small hemorrhages in the bilateral cerebellum appear mildly regressed, with slightly less edema around larger left side hemorrhage. No posterior fossa mass effect. Right MCA mixed density infarct with hemorrhage in the temporal and inferior parietal lobe appears stable. Small volume intraventricular hemorrhage is stable. No ventriculomegaly, but there is mildly increased ex vacuo enlargement of the right frontal horn. Regressed small volume contralateral left side subarachnoid hemorrhage. Basilar cisterns are normal. No significant midline shift. Stable gray-white matter differentiation elsewhere. Vascular: Calcified atherosclerosis at the skull base. Skull: Stable right hemi craniectomy. Sinuses/Orbits: Partially visible right nasoenteric tube. Bubbly opacity in the pharynx. Visualized paranasal sinuses and mastoids are stable and well pneumatized. Other: Stable orbit and scalp soft tissues. IMPRESSION: 1. Stable hemorrhagic Right MCA infarct since 12/20/2019. Right hemi-craniectomy with no midline shift or significant intracranial mass effect. 2. Mildly regressed small cerebellar hemorrhages. No posterior fossa mass effect. 3. Stable small volume intraventricular hemorrhage, no ventriculomegaly. Trace subarachnoid hemorrhage largely resolved. 4. No new intracranial abnormality. Electronically Signed   By: Genevie Ann M.D.   On: 12/25/2019 15:19   DG CHEST PORT 1 VIEW  Result Date: 12/26/2019 CLINICAL DATA:  64 year old female status post central line placement. EXAM: PORTABLE CHEST 1 VIEW  COMPARISON:  Chest x-ray 12/26/2019. FINDINGS: An endotracheal tube is in place with tip 3.1 cm above the carina. A feeding tube is seen extending into the abdomen, however, the tip of the feeding tube extends below the lower margin of the image. There is a left-sided internal jugular central venous catheter with tip terminating in the superior cavoatrial junction. Right IJ Cordis with tip terminating in the mid superior vena cava. 3 left-sided chest tubes appear stable in position with tips projecting over the upper, mid and lower left hemithorax. Numerous skin staples overlying the left hemithorax. Status post median sternotomy. Right-sided pacemaker device in place with lead tips projecting over the expected location of the right atrium and right ventricle. Lung volumes are low. Opacities throughout the left mid to lower lung, which may reflect areas of atelectasis  and/or consolidation. Small amount of left pleural fluid, some of which is likely partially loculated in the lateral aspect of the left mid hemithorax. No appreciable pneumothorax. Cephalization of the pulmonary vasculature with indistinct interstitial markings. Mild cardiomegaly. Upper mediastinal contours are within normal limits. IMPRESSION: 1. Postoperative changes and support apparatus, as above. 2. The appearance the chest suggests mild pulmonary edema. 3. Atelectasis and/or consolidation in the left mid to lower lung with small left pleural effusion. Electronically Signed   By: Vinnie Langton M.D.   On: 12/26/2019 12:10   DG Chest Port 1 View  Result Date: 12/26/2019 CLINICAL DATA:  Respiratory failure EXAM: PORTABLE CHEST 1 VIEW COMPARISON:  1 day prior FINDINGS: Endotracheal tube terminates 2.4 cm above carina. Feeding tube extends beyond the inferior aspect of the film. Right internal jugular Cordis sheath and left-sided subclavian line tips in high and low SVC respectively. Median sternotomy. Dual lead pacer. Cardiomegaly accentuated  by AP portable technique. 3 left chest tubes remain in place. Small left pleural effusion is similar. There is likely a tiny layering right pleural effusion. No pneumothorax. Mild interstitial edema, accentuated by low lung volumes. Left greater than right base airspace disease is most likely atelectasis. IMPRESSION: No significant change since one day prior. Congestive heart failure with left greater than right pleural effusions and airspace disease. Left chest tubes in place, without pneumothorax. Electronically Signed   By: Abigail Miyamoto M.D.   On: 12/26/2019 09:40   EEG adult  Result Date: 12/26/2019 Lora Havens, MD     12/26/2019  1:18 PM Patient Name: Krystal Little MRN: [id number] Epilepsy Attending: Lora Havens Referring Physician/Provider: Noe Gens, NP Date: 12/26/2019 Duration: 24.51 mins Patient history: 64 year old female with right temporal and posterior fossa hematoma s/p hemicraniectomy who was noted to have seizure-like activity.  EEG evaluate for seizures. Level of alertness: comatose AEDs during EEG study: None Technical aspects: This EEG study was done with scalp electrodes positioned according to the 10-20 International system of electrode placement. Electrical activity was acquired at a sampling rate of _0  and reviewed with a high frequency filter of _1  and a low frequency filter of _2 . EEG data were recorded continuously and digitally stored. Description: EEG showed continuous generalized and lateralized right hemisphere 3 to 5 Hz theta-delta slowing.  Sharp transients were also seen in the right frontocentral region.  Hyperventilation and photic stimulation were not performed.   ABNORMALITY -Continuous slow, generalized and lateralized right hemisphere IMPRESSION: This study is suggestive of cortical dysfunction in the right hemisphere consistent with underlying craniotomy.  Additionally, there is evidence of severe diffuse encephalopathy, nonspecific to etiology.  No  seizures or definite epileptiform discharges were seen throughout the recording. If concern for interictal/ictal activity persists, consider long-term EEG. Priyanka Barbra Sarks    Medications: Infusions: .  prismasol BGK 4/2.5 500 mL/hr at 12/27/19 0054  .  prismasol BGK 4/2.5 300 mL/hr at 12/26/19 1447  . sodium chloride Stopped (12/27/19 0118)  . amiodarone 30 mg/hr (12/27/19 0700)  . ceFEPime (MAXIPIME) IV    . dexmedetomidine (PRECEDEX) IV infusion 0.6 mcg/kg/hr (12/27/19 0700)  . feeding supplement (VITAL AF 1.2 CAL) 1,000 mL (12/27/19 0546)  . fentaNYL infusion INTRAVENOUS 100 mcg/hr (12/27/19 0700)  . furosemide Stopped (12/26/19 0232)  . levETIRAcetam Stopped (12/26/19 2348)  . norepinephrine (LEVOPHED) Adult infusion 2 mcg/min (12/27/19 0700)  . prismasol BGK 4/2.5 1,500 mL/hr at 12/27/19 0405    Scheduled Medications: . amantadine  100 mg Per Tube BID  .  aspirin  325 mg Per Tube Daily  . atorvastatin  40 mg Per Tube Daily  . B-complex with vitamin C  1 tablet Per Tube Daily  . chlorhexidine gluconate (MEDLINE KIT)  15 mL Mouth Rinse BID  . Chlorhexidine Gluconate Cloth  6 each Topical Q0600  . docusate  100 mg Per Tube BID  . feeding supplement (PRO-STAT SUGAR FREE 64)  30 mL Per Tube Daily  . insulin aspart  0-20 Units Subcutaneous Q4H  . insulin aspart  6 Units Subcutaneous Q4H  . insulin glargine  10 Units Subcutaneous Daily  . mouth rinse  15 mL Mouth Rinse 10 times per day  . pantoprazole (PROTONIX) IV  40 mg Intravenous Q24H  . polyethylene glycol  17 g Per Tube Daily  . senna-docusate  1 tablet Per Tube BID  . sodium chloride flush  10-40 mL Intracatheter Q12H    have reviewed scheduled and prn medications.  Physical Exam: General: Sedated, intubated, on continuous EEG monitoring Heart:RRR, s1s2 nl, no rubs Lungs: Coarse breath sound bilateral Abdomen:soft, Non-tender, non-distended Extremities: Bilateral lower extremity edema present Dialysis Access: Left IJ  temporary HD catheter, site looks clean Neurology: Sedated.  Daejon Lich Prasad Audra Bellard 12/27/2019,7:41 AM  LOS: 13 days  Pager: [phone]

## 2019-12-28 LAB — RENAL FUNCTION PANEL
Albumin: 1.5 g/dL — ABNORMAL LOW (ref 3.5–5.0)
Albumin: 1.5 g/dL — ABNORMAL LOW (ref 3.5–5.0)
Anion gap: 13 (ref 5–15)
Anion gap: 11 (ref 5–15)
BUN: 37 mg/dL — ABNORMAL HIGH (ref 8–23)
BUN: 29 mg/dL — ABNORMAL HIGH (ref 8–23)
CO2: 22 mmol/L (ref 22–32)
CO2: 22 mmol/L (ref 22–32)
Calcium: 7.6 mg/dL — ABNORMAL LOW (ref 8.9–10.3)
Calcium: 7.6 mg/dL — ABNORMAL LOW (ref 8.9–10.3)
Chloride: 99 mmol/L (ref 98–111)
Chloride: 101 mmol/L (ref 98–111)
Creatinine, Ser: 1.94 mg/dL — ABNORMAL HIGH (ref 0.44–1.00)
Creatinine, Ser: 1.71 mg/dL — ABNORMAL HIGH (ref 0.44–1.00)
GFR calc Af Amer: 31 mL/min — ABNORMAL LOW (ref >60)
GFR calc Af Amer: 36 mL/min — ABNORMAL LOW (ref >60)
GFR calc non Af Amer: 27 mL/min — ABNORMAL LOW (ref >60)
GFR calc non Af Amer: 31 mL/min — ABNORMAL LOW (ref >60)
Glucose, Bld: 154 mg/dL — ABNORMAL HIGH (ref 70–99)
Glucose, Bld: 220 mg/dL — ABNORMAL HIGH (ref 70–99)
Phosphorus: 3.4 mg/dL (ref 2.5–4.6)
Phosphorus: 3.2 mg/dL (ref 2.5–4.6)
Potassium: 4 mmol/L (ref 3.5–5.1)
Potassium: 4.4 mmol/L (ref 3.5–5.1)
Sodium: 134 mmol/L — ABNORMAL LOW (ref 135–145)
Sodium: 134 mmol/L — ABNORMAL LOW (ref 135–145)

## 2019-12-28 LAB — CBC
HCT: 25.5 % — ABNORMAL LOW (ref 36.0–46.0)
Hemoglobin: 8.1 g/dL — ABNORMAL LOW (ref 12.0–15.0)
MCH: 30 pg (ref 26.0–34.0)
MCHC: 31.8 g/dL (ref 30.0–36.0)
MCV: 94.4 fL (ref 80.0–100.0)
Platelets: 149 10*3/uL — ABNORMAL LOW (ref 150–400)
RBC: 2.7 MIL/uL — ABNORMAL LOW (ref 3.87–5.11)
RDW: 19.6 % — ABNORMAL HIGH (ref 11.5–15.5)
WBC: 18.4 10*3/uL — ABNORMAL HIGH (ref 4.0–10.5)
nRBC: 0.2 % (ref 0.0–0.2)

## 2019-12-28 LAB — GLUCOSE, CAPILLARY
Glucose-Capillary: 155 mg/dL — ABNORMAL HIGH (ref 70–99)
Glucose-Capillary: 209 mg/dL — ABNORMAL HIGH (ref 70–99)
Glucose-Capillary: 215 mg/dL — ABNORMAL HIGH (ref 70–99)
Glucose-Capillary: 205 mg/dL — ABNORMAL HIGH (ref 70–99)
Glucose-Capillary: 247 mg/dL — ABNORMAL HIGH (ref 70–99)

## 2019-12-28 LAB — MAGNESIUM: Magnesium: 2.2 mg/dL (ref 1.7–2.4)

## 2019-12-28 MED ORDER — PANTOPRAZOLE SODIUM 40 MG IV SOLR
40.0000 mg | INTRAVENOUS | Status: DC
  Administered 2019-12-28 – 2020-01-02 (×6): 40 mg via INTRAVENOUS
  Filled 2019-12-28 (×6): qty 40

## 2019-12-28 NOTE — Progress Notes (Signed)
LTM maint complete - no skin breakdown under:  fp1,f8,f4

## 2019-12-28 NOTE — Progress Notes (Addendum)
Subjective: No clinical seizures  Exam: Vitals:   12/28/19 0800 12/28/19 0815  BP: (!) 134/45 (!) 125/108  Pulse: 70 71  Resp: 17 14  Temp: 98.2 F (36.8 C) 98.2 F (36.8 C)  SpO2: 97% 97%   Gen: In bed, NAD Resp: non-labored breathing, no acute distress Abd: soft, nt  Neuro: MS: opens eyes partially OB:SJGGE, eyes slightly dysconjugate, does not fixate or track.  Motor: minimal withdrawal bilateral upper extremities but slightly more on the right. , flicker bilateral LE Sensory:as above  Pertinent Labs: GFR 31  Impression: 64 year old female with recent hemorrhagic infarct is now developed seizures.  She has been started on Keppra  and fosphenytoin.   Recommendations: 1) f/u EEG read, adjust medications accordingly.  2) increase keppra to 750mg  BID(given incerasing renal function) 3) continue dilantin 100mg  TID, level tomorrow.   This patient is critically ill and at significant risk of neurological worsening, death and care requires constant monitoring of vital signs, hemodynamics,respiratory and cardiac monitoring, neurological assessment, discussion with family, other specialists and medical decision making of high complexity. I spent 35 minutes of neurocritical care time  in the care of  this patient. This was time spent independent of any time provided by nurse practitioner or PA.  Roland Rack, MD [facility] [phone]  If 7pm- 7am, please page neurology on call as listed in [place]. 12/28/2019  10:09 AM

## 2019-12-28 NOTE — Procedures (Addendum)
Patient Name:Krystal Little:[id number] Epilepsy Attending:Taray Normoyle Barbra Sarks Referring [place], NP Duration:12/27/2019 1447 to 12/28/2019 1447  Patient history:64 year old female with right temporal and posterior fossa hematomas/p hemicraniectomy whowas noted to have seizure-like activity. EEG evaluate for seizures.  Level of alertness:comatose  AEDs during EEG study:LEV, dilantin  Technical aspects: This EEG study was done with scalp electrodes positioned according to the 10-20 International system of electrode placement. Electrical activity was acquired at a sampling rate of 500Hz  and reviewed with a high frequency filter of 70Hz  and a low frequency filter of 1Hz . EEG data were recorded continuously and digitally stored.   Description:EEG showed continuous generalized and lateralized right hemisphere 3 to 5 Hz theta-delta slowing. Sharp waves were also seen in the right frontocentral region which at times appear rhythmic with triphasic morphology when patient is stimulated. Hyperventilation and photic stimulation were not performed.   Event button was pressed on 12/27/2019 at 2246 for  Concomitant eeg didn't show change to suggest seizure,  ABNORMALITY - Sharp waves, right frontocentral region - Continuous slow, generalized and lateralized right hemisphere  IMPRESSION: This study showed epileptogenicity arising from right frontocentral region. When patient is stimulated, the sharp waves appear rhythmic with triphasic morphology without clear evolution and is likely suggestive of underlying cortical irritability. Additionally, there is cortical dysfunction in the right hemisphere consistent with underlying craniotomy as well as severe diffuse encephalopathy, nonspecific to etiology.   Alden Bensinger Barbra Sarks

## 2019-12-28 NOTE — Progress Notes (Signed)
NAME:  Krystal Little, MRN:  [id number], DOB:  Dec 06, 1955, LOS: 54 ADMISSION DATE:  12/01/2019, CONSULTATION DATE:  5/29 REFERRING MD:  Letta Pate, CHIEF COMPLAINT:  Chest congestion   Brief History   55 female with an extensive cardiac history was admitted to [facility] in April in the setting of a right MCA stroke, had a right interventional radiology guided thrombectomy, later had significant brain edema and underwent craniectomy, ultimately transferred to inpatient rehab.  Pulmonary and critical care medicine was consulted on May 29 in the setting of tachypnea and an enlarging left-sided pleural effusion. Thoracentesis was performed which showed a hemothorax.  A CT chest was then ordered showing obstruction of her left mainstem bronchus and a loculated left pleural effusion.  On May 30 she was moved to the ICU for chest tube placement and bronchoscopy.   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 Move to ICU for intubation to facilitate bronchoscopy and chest tube placement 5/31 TPA/ pulmonzyme started for 3 days for persistent left effusion 6/01 Afebrile, low-dose Levophed, Precedex, Minimal output on chest tube- pulmozyme / tpa x 2,  6/02 Weaned x 7 hours yesterday PSV 15/5, pulmozyme and TPA completed 3 days 6/03 afib with RVR placed on amio gtt overnight , 1U PRBC for Hb 7.1  6/05 extubated successfully, Repeat TPA/Pulmonzyme given again  6/06 acute decompensation overnight, hemorrhagic shock > to OR for left VATS with 2L hemothorax drained.  CT head with new ICH> neurosurgery consulted 6/09 More awake but  agitated on PSV weaning  6/12: Seizures + on EEG   Consults:  PCCM  Procedures:  5/29 Thoracentesis >> 1.5 L bloody fluid 5/30 ETT >  5/30 L pigtail chest tube >  Bronchoscopy 5/30 >>  large, thick, grey mucus plug completely occluding the left mainstem bronchus.   Significant Diagnostic Tests:  Thoracentesis left chest 5/29 >> 8K WBC, 78% WBC, protein 4.0, LDH 182 CT chest abdomen pelvis 6/6 >> large left hemithorax, patchy right lung opacities with small right effusion.  No acute abdominal process CT head 6/6 >> evolution of right MCA infarct with hemorrhagic transformation with large intraparenchymal hematoma, 2 additional small hemorrhages in the inferior cerebellum and left temporal lobe.  Trace subarachnoid blood. CT Head 6/10 >> stable hemorrhagic R MCA.  Right hemi-craniectomy with no midline shift or mass effect.  Mildly regressed small cerebellar hemorrhages.  Stable small volume intraventricular hemorrhage, no ventriculomegaly.  Trace subarachnoid hemorrhage largely resolved.  6/12 EEG: right frontocentral seizures X2   Micro Data:   RVP 4/16 neg  MRSA PCR  4/17 neg   ======== 5/29 Left pleural fluid culture > negative 5/29 left pleural fluid cytology >> No malignant cells identified. Blood and acute inflammation.  5/30 BAL >> rare candida albicans 6/06 L Pleural Fluid >> negative 6/06 BCx2 >> negative   Antimicrobials:  Cefepime 4/24 >> 4/25 Zosyn 4/28 >> 5/4 Zosyn 5/30 >> 6/5 Cefepime 6/6 >> 6/12  Interim history/subjective:   Daughter updated at bedside. EEG with cortical irritability. Appreciate neurology input. Remains critically ill intubated on mechanical life support CVVHD in intensive care unit.  Objective   Blood pressure (!) 145/55, pulse  68, temperature 97.7 F (36.5 C), resp. rate 18, height _0  (1.626 m), weight 86.2 kg, SpO2 98 %. CVP:  [12 mmHg-22 mmHg] 14 mmHg  Vent Mode: PRVC FiO2 (%):  [30 %] 30 % Set Rate:  [20 bmp] 20 bmp Vt Set:  [430  mL] 430 mL PEEP:  [5 cmH20] 5 cmH20 Plateau Pressure:  [20 cmH20-26 cmH20] 20 cmH20   Intake/Output Summary (Last 24 hours) at 12/28/2019 1042 Last data filed at 12/28/2019 1000 Gross per 24 hour  Intake 1923.79 ml  Output 3977 ml  Net -2053.21 ml   Filed Weights   12/26/19 0500 12/27/19 0149 12/28/19 0351  Weight: 90 kg 88.9 kg 86.2 kg    Examination: General: Elderly female, intubated mechanical life support HEENT: Status post hemicraniotomy right-sided, intubated endotracheal tube in place Neuro: Sedated on mechanical support off continuous sedation grimaces to pain CV: Regular rate rhythm, S1-S2 PULM: Bilateral mechanically ventilated breath sounds GI: Soft, nontender, nondistended Extremities: Third spaced edema in the patient's upper and lower extremities Skin: No rash  Assessment & Plan:   Hemorrhagic shock secondary to hemothorax.   This is now resolved status post multiple transfusions and taken to the OR 6 6 for VATS by cardiothoracic surgery. -Continue to follow serial hemoglobin -Chest tube management per cardiothoracic surgery  Acute Respiratory Failure with Hypoxemia Mucus Plugging, Suspected Aspiration   Left Pleural Effusion s/p Thoracentesis, L Chest Tube  -Patient remains in the tensive care unit on adult mechanical ventilator -Continue adult mechanical ventilator protocol -Continue PRVC at 8 cc/kg -Daily SBT-SAT as much as tolerated -Intermittent chest x-ray as needed -Discussed possibility of need for prolonged mechanical support with tracheostomy with family again today.  AKI secondary to shock - no evidence of obstruction on renal US. Hypernatremia. 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  Evaluted by neurosurgery who felt that CTs were stable and  no surgical evacuation or EVD was warranted. Neurosurgery with no plans for intervention at this time -Continue watch of left hemicraniectomy site for skull protective measures  Status Epilepticus, likely related to underlying stroke history  EEG -Continue Keppra -Dilantin added by neurology AED management per Dr. Leonel Ramsay and neurology services.  DM2 with Hyperglycemia -Continue SSI with sliding scale scale -CBGs Goal glucose 140-180  Chronic Atrial Fibrillation s/p Pacemaker -Telemetry -Holding Eliquis due to bleeding  Moderate Protein Calorie Malnutrition  Continue tube feeds  Goals of Care P: Full code   Best practice:  Diet: TF  Pain/Anxiety/Delirium protocol (if indicated): Off all continuous sedation at this time VAP protocol (if indicated): yes DVT prophylaxis: SCDs GI prophylaxis: PPI Glucose control: SSI Mobility: BR Code Status: full code Family Communication: Daughter updated at bedside Disposition:  ICU  This patient is critically ill with multiple organ system failure; which, requires frequent high complexity decision making, assessment, support, evaluation, and titration of therapies. This was completed through the application of advanced monitoring technologies and extensive interpretation of multiple databases. During this encounter critical care time was devoted to patient care services described in this note for 33 minutes.  Garner Nash, DO [address] [facility] 12/28/2019 10:47 AM

## 2019-12-28 NOTE — Progress Notes (Signed)
[facility] NEPHROLOGY PROGRESS NOTE  Assessment/ Plan: Pt is a 64 y.o. yo female with history of HTN, DM, chronic A. Fib on Coumadin, SSS status post pacemaker, MVR, recent stroke in 10/2019 and then stay in CIR. she developed respiratory distress, enlarging pleural effusion and underwent thoracocentesis.  She was intubated and the loculated effusion was treated with TPA which was complicated by hemorrhagic shock requiring pressors.  Status post VATS on 6/6 and hemoglobin dropped to four.  We are consulted to manage acute kidney injury and decreasing urine output.  #Acute kidney injury, now oligo-anuric: Probably due to ischemic ATN in the setting of hemorrhagic shock and pressor support.  Refractory to IV diuretics and volume up therefore initiated CVVHDF on 6/11.  Left IJ temporary HD catheter was placed by PCCM. On low-dose Levophed and tolerating CRRT well, attempt UF as tolerated by BP.  No heparin because of hemorrhagic stroke.  No issue with the filter per nurse.  #Hemorrhagic shock due to hemothorax status post multiple transfusion.  Monitor hemoglobin.  On low-dose Levophed.  #Acute respiratory failure with hypoxia: Hemothorax and mucous plugging seen on bronchoscopy.  Currently on ventilator by PCCM.  # Anemia, acute blood loss: Received blood transfusion.  Trend hemoglobin, per ICU team.  #Chronic A. fib status post pacemaker: Anticoagulation on hold  #Metabolic acidosis: Continue CRRT as above.  #Hemorrhagic right MCA infarction and seizure.  Neurology team is following.  Received fosphenytoin and on Keppra.  Discussed with ICU team.  Subjective: Seen and examined in ICU.  Tolerating CRRT well.  On a small dose of levo.  With acceptable blood pressure reading.  No issue with the filter.  Objective Vital signs in last 24 hours: Vitals:   12/28/19 0730 12/28/19 0745 12/28/19 0800 12/28/19 0815  BP: (!) 138/49 (!) 132/53 (!) 134/45 (!) 125/108  Pulse: 71 66 70 71   Resp: '17 18 17 14  ' Temp: 98.2 F (36.8 C) 98.2 F (36.8 C) 98.2 F (36.8 C) 98.2 F (36.8 C)  TempSrc:   Bladder   SpO2: 100% 98% 97% 97%  Weight:      Height:       Weight change: -2.7 kg  Intake/Output Summary (Last 24 hours) at 12/28/2019 0855 Last data filed at 12/28/2019 0800 Gross per 24 hour  Intake 2108.37 ml  Output 4053 ml  Net -1944.63 ml       Labs: Basic Metabolic Panel: Recent Labs  Lab 12/27/19 0357 12/27/19 1531 12/28/19 0320  NA 134* 134* 134*  K 3.9 3.7 4.0  CL 102 101 99  CO2 21* 21* 22  GLUCOSE 197* 143* 154*  BUN 70* 51* 37*  CREATININE 2.91* 2.29* 1.94*  CALCIUM 7.8* 7.7* 7.6*  PHOS 5.2* 3.9 3.4   Liver Function Tests: Recent Labs  Lab 01/13/2020 1856 12/22/2019 1856 12/23/19 0427 12/23/19 0427 12/25/19 0456 12/26/19 1530 12/27/19 0357 12/27/19 1531 12/28/19 0320  AST 262*  --  334*  --  71*  --   --   --   --   ALT 94*  --  150*  --  52*  --   --   --   --   ALKPHOS 123  --  145*  --  129*  --   --   --   --   BILITOT 1.4*  --  0.8  --  0.7  --   --   --   --   PROT 5.3*  --  5.9*  --  6.0*  --   --   --   --   ALBUMIN 2.0*   < > 2.0*   < > 1.7*   < > 1.5* 1.6* 1.5*   < > = values in this interval not displayed.   No results for input(s): LIPASE, AMYLASE in the last 168 hours. No results for input(s): AMMONIA in the last 168 hours. CBC: Recent Labs  Lab 12/24/19 1723 12/24/19 1723 12/25/19 0456 12/25/19 0456 12/26/19 0344 12/27/19 0357 12/28/19 0711  WBC 22.9*   < > 18.5*   < > 20.9* 18.3* 18.4*  HGB 9.6*   < > 7.9*   < > 9.0* 8.0* 8.1*  HCT 29.7*   < > 24.2*   < > 27.8* 24.4* 25.5*  MCV 90.8  --  91.3  --  91.1 90.7 94.4  PLT 123*   < > 101*   < > 121* 128* 149*   < > = values in this interval not displayed.   Cardiac Enzymes: No results for input(s): CKTOTAL, CKMB, CKMBINDEX, TROPONINI in the last 168 hours. CBG: Recent Labs  Lab 12/27/19 1527 12/27/19 1957 12/27/19 2351 12/28/19 0317 12/28/19 0711  GLUCAP  146* 160* 172* 155* 209*    Iron Studies: No results for input(s): IRON, TIBC, TRANSFERRIN, FERRITIN in the last 72 hours. Studies/Results: DG CHEST PORT 1 VIEW  Result Date: 12/27/2019 CLINICAL DATA:  Hypoxia EXAM: PORTABLE CHEST 1 VIEW COMPARISON:  December 26, 2019 FINDINGS: Endotracheal tube tip is 2.6 cm above the carina. Feeding tube tip is below the diaphragm. Central catheter tips are in the superior vena cava. Pacemaker leads are attached to the right atrium and right ventricle. Chest tubes are present on the left. No pneumothorax. There is a left pleural effusion with consolidation in the left lower lobe. There is also a right pleural effusion with mild right base atelectasis. Heart is upper normal in size with pulmonary vascularity normal. No adenopathy. No bone lesions. IMPRESSION: And catheter positions as described without pneumothorax. Bilateral pleural effusions with left lower lobe airspace opacity concerning for pneumonia, likely with associated atelectasis. Stable cardiac silhouette. Electronically Signed   By: Lowella Grip III M.D.   On: 12/27/2019 09:51   DG CHEST PORT 1 VIEW  Result Date: 12/26/2019 CLINICAL DATA:  64 year old female status post central line placement. EXAM: PORTABLE CHEST 1 VIEW COMPARISON:  Chest x-ray 12/26/2019. FINDINGS: An endotracheal tube is in place with tip 3.1 cm above the carina. A feeding tube is seen extending into the abdomen, however, the tip of the feeding tube extends below the lower margin of the image. There is a left-sided internal jugular central venous catheter with tip terminating in the superior cavoatrial junction. Right IJ Cordis with tip terminating in the mid superior vena cava. 3 left-sided chest tubes appear stable in position with tips projecting over the upper, mid and lower left hemithorax. Numerous skin staples overlying the left hemithorax. Status post median sternotomy. Right-sided pacemaker device in place with lead tips  projecting over the expected location of the right atrium and right ventricle. Lung volumes are low. Opacities throughout the left mid to lower lung, which may reflect areas of atelectasis and/or consolidation. Small amount of left pleural fluid, some of which is likely partially loculated in the lateral aspect of the left mid hemithorax. No appreciable pneumothorax. Cephalization of the pulmonary vasculature with indistinct interstitial markings. Mild cardiomegaly. Upper mediastinal contours are within normal limits. IMPRESSION: 1. Postoperative changes and support apparatus, as  above. 2. The appearance the chest suggests mild pulmonary edema. 3. Atelectasis and/or consolidation in the left mid to lower lung with small left pleural effusion. Electronically Signed   By: Vinnie Langton M.D.   On: 12/26/2019 12:10   EEG adult  Result Date: 12/26/2019 Lora Havens, MD     12/26/2019  1:18 PM Patient Name: Krystal Little MRN: [id number] Epilepsy Attending: Lora Havens Referring Physician/Provider: Noe Gens, NP Date: 12/26/2019 Duration: 24.51 mins Patient history: 64 year old female with right temporal and posterior fossa hematoma s/p hemicraniectomy who was noted to have seizure-like activity.  EEG evaluate for seizures. Level of alertness: comatose AEDs during EEG study: None Technical aspects: This EEG study was done with scalp electrodes positioned according to the 10-20 International system of electrode placement. Electrical activity was acquired at a sampling rate of '500Hz'  and reviewed with a high frequency filter of '70Hz'  and a low frequency filter of '1Hz' . EEG data were recorded continuously and digitally stored. Description: EEG showed continuous generalized and lateralized right hemisphere 3 to 5 Hz theta-delta slowing.  Sharp transients were also seen in the right frontocentral region.  Hyperventilation and photic stimulation were not performed.   ABNORMALITY -Continuous slow, generalized and  lateralized right hemisphere IMPRESSION: This study is suggestive of cortical dysfunction in the right hemisphere consistent with underlying craniotomy.  Additionally, there is evidence of severe diffuse encephalopathy, nonspecific to etiology.  No seizures or definite epileptiform discharges were seen throughout the recording. If concern for interictal/ictal activity persists, consider long-term EEG. Priyanka Barbra Sarks   Overnight EEG with video  Result Date: 12/27/2019 Lora Havens, MD     12/28/2019  8:50 AM Patient Name: JULLIETTE FRENTZ MRN: [id number] Epilepsy Attending: Lora Havens Referring Physician/Provider: Noe Gens, NP Duration: 12/26/2019 1447 to 12/27/2019 1447  Patient history: 64 year old female with right temporal and posterior fossa hematoma s/p hemicraniectomy who was noted to have seizure-like activity.  EEG evaluate for seizures.  Level of alertness: comatose  AEDs during EEG study: LEV  Technical aspects: This EEG study was done with scalp electrodes positioned according to the 10-20 International system of electrode placement. Electrical activity was acquired at a sampling rate of '500Hz'  and reviewed with a high frequency filter of '70Hz'  and a low frequency filter of '1Hz' . EEG data were recorded continuously and digitally stored.  Description: EEG showed continuous generalized and lateralized right hemisphere 3 to 5 Hz theta-delta slowing.  Sharp waves were also seen in the right frontocentral region which at times appear rhythmic when patient is stimulated.  Hyperventilation and photic stimulation were not performed.   Event button was pressed on 12/26/2019 at 2332 and on 12/27/2019 0521 during which patient was noted to have left side semi- rhythmic head jerking with eyes open, lasting about 10 mins. Concomitant eeg showed rhythmic 4-'6hz'  theta slowing admixed with sharp waves which gradually evolved to involve bifrontal region.  ABNORMALITY - Focal seizure, right frontocentral  region - Sharp waves, right frontocentral region - Continuous slow, generalized and lateralized right hemisphere  IMPRESSION: This study showed two seizures arising from right frontocentral region during which patient was noted to have left side semi- rhythmic head jerking with eyes open, on 12/26/2019 at 2332 and on 12/27/2019 0521, lasting about 10 mins. Additionally, there is cortical dysfunction in the right hemisphere consistent with underlying craniotomy as well as severe diffuse encephalopathy, nonspecific to etiology.  Priyanka Barbra Sarks    Medications: Infusions: .  prismasol BGK 4/2.5 500  mL/hr at 12/28/19 0854  .  prismasol BGK 4/2.5 300 mL/hr at 12/28/19 0101  . sodium chloride Stopped (12/28/19 0707)  . amiodarone 30 mg/hr (12/28/19 0800)  . dexmedetomidine (PRECEDEX) IV infusion Stopped (12/28/19 0707)  . feeding supplement (VITAL AF 1.2 CAL) 60 mL/hr at 12/28/19 0300  . fentaNYL infusion INTRAVENOUS Stopped (12/28/19 0707)  . levETIRAcetam    . norepinephrine (LEVOPHED) Adult infusion 1 mcg/min (12/28/19 0800)  . prismasol BGK 4/2.5 1,500 mL/hr at 12/28/19 0854    Scheduled Medications: . amantadine  100 mg Per Tube BID  . aspirin  325 mg Per Tube Daily  . atorvastatin  40 mg Per Tube Daily  . B-complex with vitamin C  1 tablet Per Tube Daily  . chlorhexidine gluconate (MEDLINE KIT)  15 mL Mouth Rinse BID  . Chlorhexidine Gluconate Cloth  6 each Topical Q0600  . docusate  100 mg Per Tube BID  . feeding supplement (PRO-STAT SUGAR FREE 64)  30 mL Per Tube Daily  . insulin aspart  0-20 Units Subcutaneous Q4H  . insulin aspart  6 Units Subcutaneous Q4H  . insulin glargine  10 Units Subcutaneous Daily  . mouth rinse  15 mL Mouth Rinse 10 times per day  . pantoprazole (PROTONIX) IV  40 mg Intravenous Q24H  . phenytoin (DILANTIN) IV  100 mg Intravenous Q8H  . polyethylene glycol  17 g Per Tube Daily  . senna-docusate  1 tablet Per Tube BID  . sodium chloride flush  10-40 mL  Intracatheter Q12H    have reviewed scheduled and prn medications.  Physical Exam: General: Critically ill looking female intubated, sedated, not responding Heart:RRR, s1s2 nl, no rubs Lungs: Coarse breath sound bilateral Abdomen:soft, Non-tender, non-distended Extremities: Bilateral lower extremity edema present Dialysis Access: Left IJ temporary HD catheter, site looks clean Neurology: Sedated.  Braelon Sprung Prasad Jenissa Tyrell 12/28/2019,8:55 AM  LOS: 14 days  Pager: [phone]

## 2019-12-28 NOTE — Progress Notes (Signed)
7 Days Post-Op Procedure(s) (LRB): Video Assisted Thoracoscopy (Vats) left , Drainage of Hemothorax (Left) Video Bronchoscopy (N/A) Subjective: intubated  Objective: Vital signs in last 24 hours: Temp:  [96.3 F (35.7 C)-98.4 F (36.9 C)] 98.2 F (36.8 C) (06/13 0815) Pulse Rate:  [42-131] 71 (06/13 0815) Cardiac Rhythm: Atrial fibrillation (06/13 0715) Resp:  [0-50] 14 (06/13 0815) BP: (90-148)/(40-109) 125/108 (06/13 0815) SpO2:  [92 %-100 %] 97 % (06/13 0815) FiO2 (%):  [30 %] 30 % (06/13 0800) Weight:  [86.2 kg] 86.2 kg (06/13 0351)  Hemodynamic parameters for last 24 hours: CVP:  [12 mmHg-22 mmHg] 14 mmHg  Intake/Output from previous day: 06/12 0701 - 06/13 0700 In: 2139.6 [I.V.:843.5; NG/GT:750; IV Piggyback:546.1] Out: 4256 [Urine:95; Stool:125; Chest Tube:230] Intake/Output this shift: Total I/O In: 80.7 [I.V.:20.7; NG/GT:60] Out: 0   General appearance: intubated Neurologic: withdraws to pain per RN Heart: irregularly irregular rhythm Lungs: rhonchi bilaterally no air leak, serosanguinous CT drainage  Lab Results: Recent Labs    12/27/19 0357 12/28/19 0711  WBC 18.3* 18.4*  HGB 8.0* 8.1*  HCT 24.4* 25.5*  PLT 128* 149*   BMET:  Recent Labs    12/27/19 1531 12/28/19 0320  NA 134* 134*  K 3.7 4.0  CL 101 99  CO2 21* 22  GLUCOSE 143* 154*  BUN 51* 37*  CREATININE 2.29* 1.94*  CALCIUM 7.7* 7.6*    PT/INR: No results for input(s): LABPROT, INR in the last 72 hours. ABG    Component Value Date/Time   PHART 7.403 12/22/2019 0348   HCO3 22.8 12/22/2019 0348   TCO2 24 12/22/2019 0348   ACIDBASEDEF 2.0 12/22/2019 0348   O2SAT 99.0 12/22/2019 0348   CBG (last 3)  Recent Labs    12/27/19 2351 12/28/19 0317 12/28/19 0711  GLUCAP 172* 155* 209*    Assessment/Plan: S/P Procedure(s) (LRB): Video Assisted Thoracoscopy (Vats) left , Drainage of Hemothorax (Left) Video Bronchoscopy (N/A) -230 ml from Ct last 24 hours, no air leak Leave CT in  place for now Recheck CXR in AM   LOS: 14 days    Melrose Nakayama 12/28/2019

## 2019-12-29 ENCOUNTER — Inpatient Hospital Stay (HOSPITAL_COMMUNITY): Payer: Medicare Other

## 2019-12-29 LAB — RENAL FUNCTION PANEL
Albumin: 1.7 g/dL — ABNORMAL LOW (ref 3.5–5.0)
Albumin: 1.6 g/dL — ABNORMAL LOW (ref 3.5–5.0)
Anion gap: 8 (ref 5–15)
Anion gap: 9 (ref 5–15)
BUN: 20 mg/dL (ref 8–23)
BUN: 17 mg/dL (ref 8–23)
CO2: 25 mmol/L (ref 22–32)
CO2: 24 mmol/L (ref 22–32)
Calcium: 7.9 mg/dL — ABNORMAL LOW (ref 8.9–10.3)
Calcium: 7.8 mg/dL — ABNORMAL LOW (ref 8.9–10.3)
Chloride: 100 mmol/L (ref 98–111)
Chloride: 101 mmol/L (ref 98–111)
Creatinine, Ser: 1.44 mg/dL — ABNORMAL HIGH (ref 0.44–1.00)
Creatinine, Ser: 1.29 mg/dL — ABNORMAL HIGH (ref 0.44–1.00)
GFR calc Af Amer: 44 mL/min — ABNORMAL LOW (ref >60)
GFR calc Af Amer: 50 mL/min — ABNORMAL LOW (ref >60)
GFR calc non Af Amer: 38 mL/min — ABNORMAL LOW (ref >60)
GFR calc non Af Amer: 43 mL/min — ABNORMAL LOW (ref >60)
Glucose, Bld: 233 mg/dL — ABNORMAL HIGH (ref 70–99)
Glucose, Bld: 206 mg/dL — ABNORMAL HIGH (ref 70–99)
Phosphorus: 2.3 mg/dL — ABNORMAL LOW (ref 2.5–4.6)
Phosphorus: 1.9 mg/dL — ABNORMAL LOW (ref 2.5–4.6)
Potassium: 4.1 mmol/L (ref 3.5–5.1)
Potassium: 4.3 mmol/L (ref 3.5–5.1)
Sodium: 133 mmol/L — ABNORMAL LOW (ref 135–145)
Sodium: 134 mmol/L — ABNORMAL LOW (ref 135–145)

## 2019-12-29 LAB — PHENYTOIN LEVEL, TOTAL: Phenytoin Lvl: 11 ug/mL (ref 10.0–20.0)

## 2019-12-29 LAB — CBC
HCT: 25.6 % — ABNORMAL LOW (ref 36.0–46.0)
Hemoglobin: 8 g/dL — ABNORMAL LOW (ref 12.0–15.0)
MCH: 30.2 pg (ref 26.0–34.0)
MCHC: 31.3 g/dL (ref 30.0–36.0)
MCV: 96.6 fL (ref 80.0–100.0)
Platelets: 146 10*3/uL — ABNORMAL LOW (ref 150–400)
RBC: 2.65 MIL/uL — ABNORMAL LOW (ref 3.87–5.11)
RDW: 20.2 % — ABNORMAL HIGH (ref 11.5–15.5)
WBC: 15.9 10*3/uL — ABNORMAL HIGH (ref 4.0–10.5)
nRBC: 0.3 % — ABNORMAL HIGH (ref 0.0–0.2)

## 2019-12-29 LAB — GLUCOSE, CAPILLARY
Glucose-Capillary: 209 mg/dL — ABNORMAL HIGH (ref 70–99)
Glucose-Capillary: 210 mg/dL — ABNORMAL HIGH (ref 70–99)
Glucose-Capillary: 220 mg/dL — ABNORMAL HIGH (ref 70–99)
Glucose-Capillary: 244 mg/dL — ABNORMAL HIGH (ref 70–99)
Glucose-Capillary: 273 mg/dL — ABNORMAL HIGH (ref 70–99)

## 2019-12-29 LAB — MAGNESIUM: Magnesium: 2.3 mg/dL (ref 1.7–2.4)

## 2019-12-29 MED ORDER — INSULIN GLARGINE 100 UNIT/ML ~~LOC~~ SOLN
10.0000 [IU] | Freq: Two times a day (BID) | SUBCUTANEOUS | Status: DC
  Administered 2019-12-29 – 2019-12-30 (×3): 10 [IU] via SUBCUTANEOUS
  Filled 2019-12-29 (×5): qty 0.1

## 2019-12-29 MED ORDER — SODIUM CHLORIDE 0.9 % IV SOLN
750.0000 mg | Freq: Two times a day (BID) | INTRAVENOUS | Status: DC
  Administered 2019-12-29 – 2020-01-06 (×16): 750 mg via INTRAVENOUS
  Filled 2019-12-29 (×17): qty 7.5

## 2019-12-29 MED ORDER — INSULIN GLARGINE 100 UNIT/ML ~~LOC~~ SOLN
10.0000 [IU] | Freq: Once | SUBCUTANEOUS | Status: DC
  Filled 2019-12-29: qty 0.1

## 2019-12-29 NOTE — Progress Notes (Signed)
Inpatient Diabetes Program Recommendations  AACE/ADA: New Consensus Statement on Inpatient Glycemic Control (2015)  Target Ranges:  Prepandial:   less than 140 mg/dL      Peak postprandial:   less than 180 mg/dL (1-2 hours)      Critically ill patients:  140 - 180 mg/dL   Lab Results  Component Value Date   GLUCAP 209 (H) 12/29/2019   HGBA1C 9.7 (H) 11/01/2019    Review of Glycemic Control Results for RAEDEN, BELZER (MRN [id number]) as of 12/29/2019 08:41  Ref. Range 12/28/2019 10:49 12/28/2019 15:01 12/28/2019 20:01 12/29/2019 00:55 12/29/2019 04:00  Glucose-Capillary Latest Ref Range: 70 - 99 mg/dL 215 (H) 205 (H) 247 (H) 273 (H) 209 (H)  Diabetes history: DM 2 Outpatient Diabetes medications:  Lantus 20 units bid, Novolog 8 units bid Current orders for Inpatient glycemic control:  Novolog resistant q 4 hours, Novolog 6 units q 4 hours, Lantus 10 units daily Inpatient Diabetes Program Recommendations:   Consider increasing Lantus to 10 units bid.   Thanks,  Adah Perl, RN, BC-ADM Inpatient Diabetes Coordinator Pager [phone] (8a-5p)

## 2019-12-29 NOTE — Procedures (Addendum)
Patient Name:Krystal Little YNX:[id number] Epilepsy Attending:Charika Mikelson Barbra Sarks Referring [place], NP Duration:12/28/2019 1447 to 12/29/2019 1012  Patient history:64 year old female with right temporal and posterior fossa hematomas/p hemicraniectomy whowas noted to have seizure-like activity. EEG evaluate for seizures.  Level of alertness:lethargic, sleep  AEDs during EEG study:LEV, dilantin  Technical aspects: This EEG study was done with scalp electrodes positioned according to the 10-20 International system of electrode placement. Electrical activity was acquired at a sampling rate of 500Hz  and reviewed with a high frequency filter of 70Hz  and a low frequency filter of 1Hz . EEG data were recorded continuously and digitally stored.   Description:No clear posterior dominant was seen.  Sleep was characterized by sleep spindles (12 to 14 Hz), maximal frontocentral region.  EEG showed continuous generalized and lateralized right hemisphere 3 to 5 Hz theta-delta slowing. Sharpwaveswere also seen in the right frontocentral regionwhich at times appear rhythmic with triphasic morphology when patient is stimulated. Hyperventilation and photic stimulation were not performed.  ABNORMALITY - Sharp waves, right frontocentral region - Continuous slow, generalized and lateralized right hemisphere  IMPRESSION: This study showed epileptogenicity arising from right frontocentral region. When patient is stimulated, the sharp waves appear rhythmic with triphasic morphology without clear evolution and is likely suggestive of underlying cortical irritability. Additionally, there is cortical dysfunction in the right hemisphere consistent with underlying craniotomy as well as severe diffuse encephalopathy, nonspecific to etiology.  Kalel Harty Barbra Sarks

## 2019-12-29 NOTE — Progress Notes (Signed)
vLTM EEG complete. Slight redness on electrode site F7. No other skin breakdown noted.

## 2019-12-29 NOTE — Progress Notes (Signed)
Krystal Little   Subjective:   This is a 64 year old lady with extensive cardiac history including mitral valve repair 2001 and pacemaker placement 2001 with a history of sick sinus syndrome.  She also has a history of chronic atrial fibrillation, and diabetes.  She was admitted to [facility] in April 2021 with right MCA stroke right interventional guided thrombectomy 10/31/2019 significant brain edema status post craniotomy after TPA.  During her stay in rehab she developed respiratory distress with an enlarging pleural effusion underwent thoracentesis.  She was subsequently intubated and developed hemorrhagic shock.  She required VATS procedure 12/17/2019.  This was complicated by acute kidney injury and chronic renal replacement therapy was initiated 12/26/2019.  No heparin was used secondary to hemorrhagic shock.  She continues on CRRT.  Very poor urine output noted with 60 cc 12/28/2019  Blood pressure 130/57 pulse 68 temperature 98 O2 sats 99% 30% FiO2 ventilator.  Sodium 133 potassium 4.1 chloride 100 CO2 25 BUN 20 creatinine 1.44 glucose 233 calcium 7.9 magnesium 2.3 phosphorus 2.3 hemoglobin 8.0 WBC 15.9 platelets 146  IV amiodarone  Amantadine 100 mg twice daily, aspirin 325 mg day Lipitor 40 mg daily vitamin B complex 1/day, insulin sliding scale, Lantus 10 units daily, Protonix 40 mg daily Dilantin 100 mg every 8 hours   Objective:  Vital signs in last 24 hours:  Temp:  [97.5 F (36.4 C)-99 F (37.2 C)] 98.1 F (36.7 C) (06/14 0400) Pulse Rate:  [62-78] 65 (06/14 0403) Resp:  [14-27] 24 (06/14 0403) BP: (113-177)/(37-108) 137/53 (06/14 0400) SpO2:  [94 %-100 %] 100 % (06/14 0403) FiO2 (%):  [30 %] 30 % (06/14 0403) Weight:  [88.9 kg] 88.9 kg (06/14 0500)  Weight change: 2.7 kg Filed Weights   12/27/19 0149 12/28/19 0351 12/29/19 0500  Weight: 88.9 kg 86.2 kg 88.9 kg    Intake/Output: I/O last 3 completed shifts: In: 3430.5 [I.V.:1164.5;  Other:150; NG/GT:1470; IV Piggyback:646.1] Out: 1610 [Urine:125; RUEAV:4098; Stool:200; Chest Tube:380]   Intake/Output this shift:  Total I/O In: 1213.2 [I.V.:343.2; NG/GT:670; IV Piggyback:200] Out: 2656 [Urine:20; JXBJY:7829; Chest Tube:80]  General: Critically ill looking female intubated, sedated, not responding Heart:RRR, s1s2 nl, no rubs Lungs: Coarse breath sound bilateral Abdomen:soft, Non-tender, non-distended Extremities: Bilateral lower extremity edema present Dialysis Access: Left IJ temporary HD catheter, site looks clean Neurology: Sedated.   Basic Metabolic Panel: Recent Labs  Lab 12/25/19 0456 12/25/19 0456 12/26/19 0344 12/26/19 1530 12/27/19 0357 12/27/19 0357 12/27/19 1531 12/27/19 1531 12/28/19 0320 12/28/19 1503 12/29/19 0353  NA 134*   < > 131*   < > 134*  --  134*  --  134* 134* 133*  K 3.7   < > 4.5   < > 3.9  --  3.7  --  4.0 4.4 4.1  CL 102   < > 100   < > 102  --  101  --  99 101 100  CO2 17*   < > 15*   < > 21*  --  21*  --  _0 GLUCOSE 201*   < > 345*   < > 197*  --  143*  --  154* 220* 233*  BUN 96*   < > 106*   < > 70*  --  51*  --  37* 29* 20  CREATININE 3.93*   < > 4.21*   < > 2.91*  --  2.29*  --  1.94* 1.71* 1.44*  CALCIUM 7.6*   < >  7.9*   < > 7.8*   < > 7.7*   < > 7.6* 7.6* 7.9*  MG 2.5*  --  2.5*  --  2.3  --   --   --  2.2  --  2.3  PHOS 7.2*   < > 8.0*   < > 5.2*  --  3.9  --  3.4 3.2 2.3*   < > = values in this interval not displayed.    Liver Function Tests: Recent Labs  Lab 12/23/19 0427 12/23/19 0427 12/25/19 0456 12/26/19 1530 12/27/19 0357 12/27/19 1531 12/28/19 0320 12/28/19 1503 12/29/19 0353  AST 334*  --  71*  --   --   --   --   --   --   ALT 150*  --  52*  --   --   --   --   --   --   ALKPHOS 145*  --  129*  --   --   --   --   --   --   BILITOT 0.8  --  0.7  --   --   --   --   --   --   PROT 5.9*  --  6.0*  --   --   --   --   --   --   ALBUMIN 2.0*   < > 1.7*   < > 1.5* 1.6* 1.5* 1.5* 1.7*   <  > = values in this interval not displayed.   No results for input(s): LIPASE, AMYLASE in the last 168 hours. No results for input(s): AMMONIA in the last 168 hours.  CBC: Recent Labs  Lab 12/25/19 0456 12/26/19 0344 12/27/19 0357 12/28/19 0711 12/29/19 0353  WBC 18.5* 20.9* 18.3* 18.4* 15.9*  HGB 7.9* 9.0* 8.0* 8.1* 8.0*  HCT 24.2* 27.8* 24.4* 25.5* 25.6*  MCV 91.3 91.1 90.7 94.4 96.6  PLT 101* 121* 128* 149* 146*    Cardiac Enzymes: No results for input(s): CKTOTAL, CKMB, CKMBINDEX, TROPONINI in the last 168 hours.  BNP: Invalid input(s): POCBNP  CBG: Recent Labs  Lab 12/28/19 1049 12/28/19 1501 12/28/19 2001 12/29/19 0055 12/29/19 0400  GLUCAP 215* 205* 247* 52* 209*    Microbiology: Results for orders placed or performed during the hospital encounter of 12/05/2019  MRSA PCR Screening     Status: None   Collection Time: 11/20/2019 11:00 AM   Specimen: Nasopharyngeal  Result Value Ref Range Status   MRSA by PCR NEGATIVE NEGATIVE Final    Comment:        The GeneXpert MRSA Assay (FDA approved for NASAL specimens only), is one component of a comprehensive MRSA colonization surveillance program. It is not intended to diagnose MRSA infection nor to guide or monitor treatment for MRSA infections. Performed at [facility], [place] [address], [place], [place] [address]   Culture, bal-quantitative     Status: Abnormal   Collection Time: 11/22/2019 11:37 AM   Specimen: Bronchoalveolar Lavage  Result Value Ref Range Status   Specimen Description BRONCHIAL ALVEOLAR LAVAGE  Final   Special Requests NONE  Final   Gram Stain   Final    FEW WBC PRESENT,BOTH PMN AND MONONUCLEAR NO ORGANISMS SEEN    Culture (A)  Final    1,000 COLONIES/mL Consistent with normal respiratory flora. Performed at [facility], [place] [address], [place], [place] [address]    Report Status 12/16/2019 FINAL  Final  Culture, blood (routine x 2)  Status: None   Collection Time:  01/08/2020  6:11 AM   Specimen: BLOOD  Result Value Ref Range Status   Specimen Description BLOOD RIGHT HAND  Final   Special Requests   Final    BOTTLES DRAWN AEROBIC ONLY Blood Culture results may not be optimal due to an inadequate volume of blood received in culture bottles   Culture   Final    NO GROWTH 5 DAYS Performed at [facility], [place] [address] [place], [place] [address]    Report Status 12/26/2019 FINAL  Final  Culture, blood (routine x 2)     Status: None   Collection Time: 12/22/2019  6:16 AM   Specimen: BLOOD  Result Value Ref Range Status   Specimen Description BLOOD RIGHT ARM  Final   Special Requests   Final    BOTTLES DRAWN AEROBIC ONLY Blood Culture results may not be optimal due to an inadequate volume of blood received in culture bottles   Culture   Final    NO GROWTH 5 DAYS Performed at [facility], [place] [address] [place], [place] [address]    Report Status 12/26/2019 FINAL  Final  Body fluid culture     Status: None   Collection Time: 12/25/2019  3:06 PM   Specimen: PATH Cytology Pleural fluid; Body Fluid  Result Value Ref Range Status   Specimen Description PLEURAL FLUID  Final   Special Requests LEFT A  Final   Gram Stain   Final    MODERATE WBC PRESENT, PREDOMINANTLY PMN NO ORGANISMS SEEN    Culture   Final    NO GROWTH 3 DAYS Performed at [facility], [address], [place], [place] [address]    Report Status 12/25/2019 FINAL  Final  Aerobic/Anaerobic Culture (surgical/deep wound)     Status: None   Collection Time: 01/12/2020  3:06 PM   Specimen: PATH Cytology Pleural fluid; Body Fluid  Result Value Ref Range Status   Specimen Description PLEURAL  Final   Special Requests FLUID LEFT  Final   Gram Stain   Final    RARE WBC PRESENT, PREDOMINANTLY PMN NO ORGANISMS SEEN    Culture   Final    No growth aerobically or anaerobically. Performed at [facility], [place] [address], [place], [place] [address]    Report Status  12/26/2019 FINAL  Final  Culture, respiratory (non-expectorated)     Status: None   Collection Time: 12/22/19  8:27 AM   Specimen: Tracheal Aspirate; Respiratory  Result Value Ref Range Status   Specimen Description TRACHEAL ASPIRATE  Final   Special Requests Normal  Final   Gram Stain   Final    RARE WBC PRESENT, PREDOMINANTLY PMN NO ORGANISMS SEEN Performed at [facility], [place] [address] [place], [place] [address]    Culture RARE CANDIDA ALBICANS  Final   Report Status 12/25/2019 FINAL  Final    Coagulation Studies: No results for input(s): LABPROT, INR in the last 72 hours.  Urinalysis: No results for input(s): COLORURINE, LABSPEC, PHURINE, GLUCOSEU, HGBUR, BILIRUBINUR, KETONESUR, PROTEINUR, UROBILINOGEN, NITRITE, LEUKOCYTESUR in the last 72 hours.  Invalid input(s): APPERANCEUR    Imaging: Overnight EEG with video  Result Date: 12/27/2019 Lora Havens, MD     12/28/2019  8:50 AM Patient Name: EDYE HAINLINE MRN: [id number] Epilepsy Attending: Lora Havens Referring Physician/Provider: Noe Gens, NP Duration: 12/26/2019 1447 to 12/27/2019 1447  Patient history: 64 year old female with right  temporal and posterior fossa hematoma s/p hemicraniectomy who was noted to have seizure-like activity.  EEG evaluate for seizures.  Level of alertness: comatose  AEDs during EEG study: LEV  Technical aspects: This EEG study was done with scalp electrodes positioned according to the 10-20 International system of electrode placement. Electrical activity was acquired at a sampling rate of _0  and reviewed with a high frequency filter of _1  and a low frequency filter of _2 . EEG data were recorded continuously and digitally stored.  Description: EEG showed continuous generalized and lateralized right hemisphere 3 to 5 Hz theta-delta slowing.  Sharp waves were also seen in the right frontocentral region which at times appear rhythmic when patient is stimulated.   Hyperventilation and photic stimulation were not performed.   Event button was pressed on 12/26/2019 at 2332 and on 12/27/2019 0521 during which patient was noted to have left side semi- rhythmic head jerking with eyes open, lasting about 10 mins. Concomitant eeg showed rhythmic 4-_3  theta slowing admixed with sharp waves which gradually evolved to involve bifrontal region.  ABNORMALITY - Focal seizure, right frontocentral region - Sharp waves, right frontocentral region - Continuous slow, generalized and lateralized right hemisphere  IMPRESSION: This study showed two seizures arising from right frontocentral region during which patient was noted to have left side semi- rhythmic head jerking with eyes open, on 12/26/2019 at 2332 and on 12/27/2019 0521, lasting about 10 mins. Additionally, there is cortical dysfunction in the right hemisphere consistent with underlying craniotomy as well as severe diffuse encephalopathy, nonspecific to etiology.  Priyanka Barbra Sarks     Medications:   .  prismasol BGK 4/2.5 500 mL/hr at 12/29/19 0350  .  prismasol BGK 4/2.5 300 mL/hr at 12/28/19 1700  . sodium chloride 10 mL/hr at 12/29/19 0400  . amiodarone 30 mg/hr (12/29/19 0400)  . dexmedetomidine (PRECEDEX) IV infusion 0.8 mcg/kg/hr (12/29/19 0400)  . feeding supplement (VITAL AF 1.2 CAL) 1,000 mL (12/28/19 1227)  . fentaNYL infusion INTRAVENOUS 50 mcg/hr (12/29/19 0400)  . levETIRAcetam Stopped (12/28/19 2216)  . norepinephrine (LEVOPHED) Adult infusion Stopped (12/28/19 0817)  . prismasol BGK 4/2.5 1,500 mL/hr (12/29/19 0437)   . amantadine  100 mg Per Tube BID  . aspirin  325 mg Per Tube Daily  . atorvastatin  40 mg Per Tube Daily  . B-complex with vitamin C  1 tablet Per Tube Daily  . chlorhexidine gluconate (MEDLINE KIT)  15 mL Mouth Rinse BID  . Chlorhexidine Gluconate Cloth  6 each Topical Q0600  . docusate  100 mg Per Tube BID  . feeding supplement (PRO-STAT SUGAR FREE 64)  30 mL Per Tube Daily  .  insulin aspart  0-20 Units Subcutaneous Q4H  . insulin aspart  6 Units Subcutaneous Q4H  . insulin glargine  10 Units Subcutaneous Daily  . mouth rinse  15 mL Mouth Rinse 10 times per day  . pantoprazole (PROTONIX) IV  40 mg Intravenous Q24H  . phenytoin (DILANTIN) IV  100 mg Intravenous Q8H  . polyethylene glycol  17 g Per Tube Daily  . senna-docusate  1 tablet Per Tube BID  . sodium chloride flush  10-40 mL Intracatheter Q12H   sodium chloride, acetaminophen, bisacodyl, fentaNYL, midazolam, ondansetron (ZOFRAN) IV, oxyCODONE, senna-docusate, sodium chloride flush, traMADol  Assessment/ Plan:   Acute kidney injury, oliguric ischemic ATN secondary to hemorrhagic shock requiring pressor support.  Refractory to IV diuretics initiated on CRRT 12/26/2019 left IJ temporary dialysis catheter per CCM.  Maintaining even at this  period of time secondary to hypotension.  ANEMIA-transfusion 5 units packed red blood cells 9 units fresh frozen plasma 2 units cryoprecipitate.  Last transfusion 01/08/2020  Chronic atrial fibrillation status post pacemaker anticoagulation on hold.  Rate controlled with amiodarone  Hemorrhagic shock secondary to hemothorax status post multiple transfusions 01/08/2020.  Titrating Levophed  Metabolic acidosis appears to be controlled with CRRT  Hemorrhagic right MCA infarction seizure having received thrombectomy complicated by brain edema and craniectomy 10/31/2019  Diabetes mellitus insulin sliding scale  Seizure prophylaxis continues with Dilantin     LOS: [place] _0 _1 :49 AM

## 2019-12-29 NOTE — Progress Notes (Signed)
8 Days Post-Op Procedure(s) (LRB): Video Assisted Thoracoscopy (Vats) left , Drainage of Hemothorax (Left) Video Bronchoscopy (N/A) Subjective: 8 days postop drainage of large left hemothorax Patient remains without purposeful movement on CRT Chest tube drainage serous  250 cc/day Chest x-ray otherwise unremarkable We will leave chest tubes until output becomes less than 100 cc/day and start removing 1 tube at a time Objective: Vital signs in last 24 hours: Temp:  [97.5 F (36.4 C)-99 F (37.2 C)] 98.2 F (36.8 C) (06/14 1000) Pulse Rate:  [62-81] 76 (06/14 1000) Cardiac Rhythm: Ventricular paced;Atrial fibrillation (06/14 0730) Resp:  [14-31] 31 (06/14 1000) BP: (113-178)/(37-69) 165/54 (06/14 1000) SpO2:  [94 %-100 %] 100 % (06/14 1000) FiO2 (%):  [30 %] 30 % (06/14 0800) Weight:  [88.9 kg] 88.9 kg (06/14 0500)  Hemodynamic parameters for last 24 hours: CVP:  [12 mmHg-16 mmHg] 16 mmHg  Intake/Output from previous day: 06/13 0701 - 06/14 0700 In: 2706.1 [I.V.:746.1; NG/GT:1510; IV Piggyback:300] Out: 5978 [Urine:50; Stool:75; Chest Tube:230] Intake/Output this shift: Total I/O In: 371.3 [I.V.:91.3; NG/GT:180; IV Piggyback:100] Out: 872 [Urine:10; Other:842; Chest Tube:20]  Eyes open but no purposeful movement Thoracotomy incision clean and dry Chest tubes with serous drainage, no air leak  Lab Results: Recent Labs    12/28/19 0711 12/29/19 0353  WBC 18.4* 15.9*  HGB 8.1* 8.0*  HCT 25.5* 25.6*  PLT 149* 146*   BMET:  Recent Labs    12/28/19 1503 12/29/19 0353  NA 134* 133*  K 4.4 4.1  CL 101 100  CO2 22 25  GLUCOSE 220* 233*  BUN 29* 20  CREATININE 1.71* 1.44*  CALCIUM 7.6* 7.9*    PT/INR: No results for input(s): LABPROT, INR in the last 72 hours. ABG    Component Value Date/Time   PHART 7.403 12/22/2019 0348   HCO3 22.8 12/22/2019 0348   TCO2 24 12/22/2019 0348   ACIDBASEDEF 2.0 12/22/2019 0348   O2SAT 99.0 12/22/2019 0348   CBG (last 3)   Recent Labs    12/28/19 2001 12/29/19 0055 12/29/19 0400  GLUCAP 247* 273* 209*    Assessment/Plan: S/P Procedure(s) (LRB): Video Assisted Thoracoscopy (Vats) left , Drainage of Hemothorax (Left) Video Bronchoscopy (N/A) Leave chest tubes in place until drainage decreases significantly   LOS: 15 days    Tharon Aquas Trigt III 12/29/2019

## 2019-12-29 NOTE — Progress Notes (Signed)
EEG maintenance complete. No skin breakdown at site FP1 FP2 Cz Fz C4. Continue to monitor

## 2019-12-29 NOTE — Progress Notes (Signed)
Subjective: No acute events overnight.  Patient's daughter at bedside.  ROS: Unable to obtain due to poor mental status  Examination  Vital signs in last 24 hours: Temp:  [97.5 F (36.4 C)-99 F (37.2 C)] 98.2 F (36.8 C) (06/14 1000) Pulse Rate:  [62-81] 76 (06/14 1000) Resp:  [14-31] 31 (06/14 1000) BP: (113-178)/(37-69) 165/54 (06/14 1000) SpO2:  [94 %-100 %] 100 % (06/14 1000) FiO2 (%):  [30 %] 30 % (06/14 0800) Weight:  [88.9 kg] 88.9 kg (06/14 0500)  General: lying in bed, not in apparent distress CVS: pulse-normal rate and rhythm RS: breathing comfortably, intubated Extremities: normal, warm  Neuro: MS: Opens eyes to noxious stimuli, does not follow commands CN: pupils equal and reactive, corneal reflex intact, gag reflex intact, difficult to assess oculocephalics as patient forcefully closes eyes  Motor: Withdraws to noxious stimuli in all 4 extremities with 3/5 strength in bilateral upper extremities distally and 2/5 in bilateral lower extremities  Reflexes: 1+ bilaterally over patella, biceps  Basic Metabolic Panel: Recent Labs  Lab 12/25/19 0456 12/25/19 0456 12/26/19 0344 12/26/19 1530 12/27/19 0357 12/27/19 0357 12/27/19 1531 12/27/19 1531 12/28/19 0320 12/28/19 1503 12/29/19 0353  NA 134*   < > 131*   < > 134*  --  134*  --  134* 134* 133*  K 3.7   < > 4.5   < > 3.9  --  3.7  --  4.0 4.4 4.1  CL 102   < > 100   < > 102  --  101  --  99 101 100  CO2 17*   < > 15*   < > 21*  --  21*  --  22 22 25   GLUCOSE 201*   < > 345*   < > 197*  --  143*  --  154* 220* 233*  BUN 96*   < > 106*   < > 70*  --  51*  --  37* 29* 20  CREATININE 3.93*   < > 4.21*   < > 2.91*  --  2.29*  --  1.94* 1.71* 1.44*  CALCIUM 7.6*   < > 7.9*   < > 7.8*   < > 7.7*   < > 7.6* 7.6* 7.9*  MG 2.5*  --  2.5*  --  2.3  --   --   --  2.2  --  2.3  PHOS 7.2*   < > 8.0*   < > 5.2*  --  3.9  --  3.4 3.2 2.3*   < > = values in this interval not displayed.    CBC: Recent Labs  Lab  12/25/19 0456 12/26/19 0344 12/27/19 0357 12/28/19 0711 12/29/19 0353  WBC 18.5* 20.9* 18.3* 18.4* 15.9*  HGB 7.9* 9.0* 8.0* 8.1* 8.0*  HCT 24.2* 27.8* 24.4* 25.5* 25.6*  MCV 91.3 91.1 90.7 94.4 96.6  PLT 101* 121* 128* 149* 146*     Coagulation Studies: No results for input(s): LABPROT, INR in the last 72 hours.  Imaging CT head without contrast 12/25/2019: 1. Stable hemorrhagic Right MCA infarct since 12/26/2019. Right  hemi-craniectomy with no midline shift or significant intracranial mass effect. 2. Mildly regressed small cerebellar hemorrhages. No posterior fossa mass effect. 3. Stable small volume intraventricular hemorrhage, no ventriculomegaly. Trace subarachnoid hemorrhage largely resolved. 4. No new intracranial abnormality.      ASSESSMENT AND PLAN: Krystal Little is a 64 year old female with recent right MCA hemorrhagic infarct status post hemicraniectomy was then noted  to have seizures and is currently on Keppra and fosphenytoin.  Right hemorrhagic MCA infarct status post hemicraniectomy Intraventricular hemorrhage Seizures post stroke Hyponatremia Diabetes AKI Hypoalbuminemia Hypocalcemia Leukocytosis Microcytic anemia Thrombocytopenia -LTM EEG overnight did not show any definite seizures.   Recommendations -No further seizures overnight, on exam also patient appears to be waking up and moving all extremities.  Therefore will discontinue LTM EEG. -Continue current AEDs: Keppra 750 mg twice daily, Dilantin 100 mg every 8 hours ( Corrected Dilantin level this morning 19.4) -Continue seizure precautions -Continue to minimize sedation per critical care team -As needed IV Ativan 2 mg for clinical seizure-like activity -Updated patient's daughter at bedside about EEG findings, antiseizure medication the patient is on and answered her questions. -We will defer management of rest of comorbidities to primary team   CRITICAL CARE Performed by: Lora Havens  Total critical care time: 35 minutes  Critical care time was exclusive of separately billable procedures and treating other patients.  Critical care was necessary to treat or prevent imminent or life-threatening deterioration.  Critical care was time spent personally by me on the following activities: development of treatment plan with patient and/or surrogate as well as nursing, discussions with consultants, evaluation of patient's response to treatment, examination of patient, obtaining history from patient or surrogate, ordering and performing treatments and interventions, ordering and review of laboratory studies, ordering and review of radiographic studies, pulse oximetry and re-evaluation of patient's condition.   Krystal Little Epilepsy [facility] For questions after 5pm please refer to AMION to reach the Neurologist on call

## 2019-12-29 NOTE — Progress Notes (Signed)
NAME:  Krystal Little, MRN:  [id number], DOB:  1955-12-26, LOS: 80 ADMISSION DATE:  12/05/2019, CONSULTATION DATE:  5/29 REFERRING MD:  Letta Pate, CHIEF COMPLAINT:  Chest congestion   Brief History   38 female with an extensive cardiac history was admitted to [facility] in April in the setting of a right MCA stroke, had a right interventional radiology guided thrombectomy, later had significant brain edema and underwent craniectomy, ultimately transferred to inpatient rehab.  Pulmonary and critical care medicine was consulted on May 29 in the setting of tachypnea and an enlarging left-sided pleural effusion. Thoracentesis was performed which showed a hemothorax.  A CT chest was then ordered showing obstruction of her left mainstem bronchus and a loculated left pleural effusion.  On May 30 she was moved to the ICU for chest tube placement and bronchoscopy.   Past Medical History  Status post mitral valve repair 2001 Status post cardiac pacemaker 2001 History of sick sinus syndrome Hypertension Diabetes mellitus type 2 Chronic atrial fibrillation Right MCA stroke 2021  Significant Hospital Events   4/16 IR thrombectomy R MCA stroke 4/25 PCCM sign off 5/6 transfer to [facility] 5/29 PCCM consult left pleural effusion, thoracentesis 5/30 worsening tachypnea. Move to ICU for intubation to facilitate bronchoscopy and chest tube placement 5/31 TPA/ pulmonzyme started for 3 days for persistent left effusion 6/01 Afebrile, low-dose Levophed, Precedex, Minimal output on chest tube- pulmozyme / tpa x 2,  6/02 Weaned x 7 hours yesterday PSV 15/5, pulmozyme and TPA completed 3 days 6/03 afib with RVR placed on amio gtt overnight , 1U PRBC for Hb 7.1  6/05 extubated successfully, Repeat TPA/Pulmonzyme given again  6/06 acute decompensation overnight, hemorrhagic shock > to OR for left VATS with 2L hemothorax drained.  CT head with new ICH> neurosurgery consulted 6/09 More awake but  agitated on PSV weaning  6/12: Seizures + on EEG   Consults:  PCCM  Procedures:  5/29 Thoracentesis >> 1.5 L bloody fluid 5/30 ETT >  5/30 L pigtail chest tube >  Bronchoscopy 5/30 >>  large, thick, grey mucus plug completely occluding the left mainstem bronchus.   Significant Diagnostic Tests:  Thoracentesis left chest 5/29 >> 8K WBC, 78% WBC, protein 4.0, LDH 182 CT chest abdomen pelvis 6/6 >> large left hemithorax, patchy right lung opacities with small right effusion.  No acute abdominal process CT head 6/6 >> evolution of right MCA infarct with hemorrhagic transformation with large intraparenchymal hematoma, 2 additional small hemorrhages in the inferior cerebellum and left temporal lobe.  Trace subarachnoid blood. CT Head 6/10 >> stable hemorrhagic R MCA.  Right hemi-craniectomy with no midline shift or mass effect.  Mildly regressed small cerebellar hemorrhages.  Stable small volume intraventricular hemorrhage, no ventriculomegaly.  Trace subarachnoid hemorrhage largely resolved.  6/12 EEG: right frontocentral seizures X2   Micro Data:   RVP 4/16 neg  MRSA PCR  4/17 neg   ======== 5/29 Left pleural fluid culture > negative 5/29 left pleural fluid cytology >> No malignant cells identified. Blood and acute inflammation.  5/30 BAL >> rare candida albicans 6/06 L Pleural Fluid >> negative 6/06 BCx2 >> negative   Antimicrobials:  Cefepime 4/24 >> 4/25 Zosyn 4/28 >> 5/4 Zosyn 5/30 >> 6/5 Cefepime 6/6 >> 6/12  Interim history/subjective:   Remains critically ill intubated on mechanical life support CVVHD in the ICU.  Objective   Blood pressure (!) 166/62, pulse 79, temperature 98.4 F (36.9 C), resp. rate (!) 24, height 5'  4" (1.626 m), weight 88.9 kg, SpO2 99 %. CVP:  [12 mmHg-16 mmHg] 16 mmHg  Vent Mode: PRVC FiO2 (%):  [30 %] 30 % Set Rate:  [20 bmp] 20 bmp Vt Set:  [430 mL] 430 mL PEEP:  [5 cmH20] 5 cmH20 Pressure Support:  [20 cmH20] 20 cmH20 Plateau Pressure:   [19 cmH20-20 cmH20] 19 cmH20   Intake/Output Summary (Last 24 hours) at 12/29/2019 0949 Last data filed at 12/29/2019 0901 Gross per 24 hour  Intake 2738.53 ml  Output 6241 ml  Net -3502.47 ml   Filed Weights   12/27/19 0149 12/28/19 0351 12/29/19 0500  Weight: 88.9 kg 86.2 kg 88.9 kg    Examination: General: Elderly female, intubated on mechanical life support HEENT: Status post right-sided hemicraniectomy Neuro: Sedated on mechanical support, EEG leads in place, unresponsive CV: Regular rate rhythm S1-S2 PULM: Bilateral mechanically ventilated breath sounds GI: Soft, nontender, nondistended Extremities: Third spaced edema dependent upper and lower extremities Skin: No rash  Assessment & Plan:   Status Epilepticus, likely related to underlying stroke history  EEG -Continue Keppra -Continue Dilantin -AED management per neurology  Acute Respiratory Failure with Hypoxemia Mucus Plugging, Suspected Aspiration   Left Pleural Effusion s/p Thoracentesis, L Chest Tube  -Patient remains on adult mechanical ventilator protocol -Mental status precludes liberation from the ventilator -Continue PRVC 8 cc/kg -Daily SBT SAT as tolerated -Possible need for prolonged mechanical support.  We have discussed tracheostomy with patient's family.  They are hopeful for recovery.  Hemorrhagic shock secondary to hemothorax.   This is now resolved status post multiple transfusions and taken to the OR 6 6 for VATS by cardiothoracic surgery. -Continue to follow serial hemoglobins as necessary -Chest to management per cardiothoracic surgery  AKI secondary to shock - no evidence of obstruction on renal US. Hypernatremia. 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  Evaluted by neurosurgery who felt that CTs were stable and no  surgical evacuation or EVD was warranted. Neurosurgery with no plans for intervention at this time -Continue observation and protection of right hemicraniectomy site.  For neuro protection.  DM2 with Hyperglycemia -Continue SSI -CBGs -Goal blood glucose 140-180 -Increase Lantus twice daily dosing today  Chronic Atrial Fibrillation s/p Pacemaker -Telemetry -Holding Eliquis due to bleeding  Moderate Protein Calorie Malnutrition  -Continue tube feeds  Goals of Care P: -Full code   Best practice:  Diet: TF  Pain/Anxiety/Delirium protocol (if indicated): Off all continuous sedation at this time VAP protocol (if indicated): yes DVT prophylaxis: SCDs GI prophylaxis: PPI Glucose control: SSI Mobility: BR Code Status: full code Family Communication: Updated husband yesterday evening Disposition:  ICU  This patient is critically ill with multiple organ system failure; which, requires frequent high complexity decision making, assessment, support, evaluation, and titration of therapies. This was completed through the application of advanced monitoring technologies and extensive interpretation of multiple databases. During this encounter critical care time was devoted to patient care services described in this note for 33 minutes.   Garner Nash, DO [facility] 12/29/2019 9:49 AM

## 2019-12-29 NOTE — Progress Notes (Addendum)
All sedation turned off and "Sedation Vacation" completed from 0700 to 0900. Patient unable to follow commands but spontaneously slightly moved upper extremities. Patient opened eyes when named called but did not attempt to look at RN. No movement in lower extremities even to painful stimuli. Patient became tachycardic, hypertensive and RR of 35-40. Placed patient back on low dose Precedex and Fentanyl gtt. See MAR for dosing.

## 2019-12-30 ENCOUNTER — Inpatient Hospital Stay (HOSPITAL_COMMUNITY): Payer: Medicare Other

## 2019-12-30 LAB — RENAL FUNCTION PANEL
Albumin: 1.6 g/dL — ABNORMAL LOW (ref 3.5–5.0)
Albumin: 1.7 g/dL — ABNORMAL LOW (ref 3.5–5.0)
Anion gap: 9 (ref 5–15)
Anion gap: 8 (ref 5–15)
BUN: 14 mg/dL (ref 8–23)
BUN: 15 mg/dL (ref 8–23)
CO2: 25 mmol/L (ref 22–32)
CO2: 25 mmol/L (ref 22–32)
Calcium: 7.9 mg/dL — ABNORMAL LOW (ref 8.9–10.3)
Calcium: 7.8 mg/dL — ABNORMAL LOW (ref 8.9–10.3)
Chloride: 100 mmol/L (ref 98–111)
Chloride: 101 mmol/L (ref 98–111)
Creatinine, Ser: 1.21 mg/dL — ABNORMAL HIGH (ref 0.44–1.00)
Creatinine, Ser: 1.26 mg/dL — ABNORMAL HIGH (ref 0.44–1.00)
GFR calc Af Amer: 54 mL/min — ABNORMAL LOW (ref >60)
GFR calc Af Amer: 52 mL/min — ABNORMAL LOW (ref >60)
GFR calc non Af Amer: 47 mL/min — ABNORMAL LOW (ref >60)
GFR calc non Af Amer: 45 mL/min — ABNORMAL LOW (ref >60)
Glucose, Bld: 167 mg/dL — ABNORMAL HIGH (ref 70–99)
Glucose, Bld: 185 mg/dL — ABNORMAL HIGH (ref 70–99)
Phosphorus: 1.8 mg/dL — ABNORMAL LOW (ref 2.5–4.6)
Phosphorus: 2.2 mg/dL — ABNORMAL LOW (ref 2.5–4.6)
Potassium: 4.2 mmol/L (ref 3.5–5.1)
Potassium: 4.5 mmol/L (ref 3.5–5.1)
Sodium: 134 mmol/L — ABNORMAL LOW (ref 135–145)
Sodium: 134 mmol/L — ABNORMAL LOW (ref 135–145)

## 2019-12-30 LAB — CBC
HCT: 25.3 % — ABNORMAL LOW (ref 36.0–46.0)
Hemoglobin: 7.7 g/dL — ABNORMAL LOW (ref 12.0–15.0)
MCH: 29.7 pg (ref 26.0–34.0)
MCHC: 30.4 g/dL (ref 30.0–36.0)
MCV: 97.7 fL (ref 80.0–100.0)
Platelets: 123 10*3/uL — ABNORMAL LOW (ref 150–400)
RBC: 2.59 MIL/uL — ABNORMAL LOW (ref 3.87–5.11)
RDW: 21 % — ABNORMAL HIGH (ref 11.5–15.5)
WBC: 15.9 10*3/uL — ABNORMAL HIGH (ref 4.0–10.5)
nRBC: 0.2 % (ref 0.0–0.2)

## 2019-12-30 LAB — MAGNESIUM: Magnesium: 2.3 mg/dL (ref 1.7–2.4)

## 2019-12-30 LAB — GLUCOSE, CAPILLARY
Glucose-Capillary: 139 mg/dL — ABNORMAL HIGH (ref 70–99)
Glucose-Capillary: 209 mg/dL — ABNORMAL HIGH (ref 70–99)
Glucose-Capillary: 167 mg/dL — ABNORMAL HIGH (ref 70–99)
Glucose-Capillary: 169 mg/dL — ABNORMAL HIGH (ref 70–99)

## 2019-12-30 MED ORDER — HYDRALAZINE HCL 20 MG/ML IJ SOLN
10.0000 mg | INTRAMUSCULAR | Status: DC | PRN
  Administered 2019-12-30 – 2020-01-17 (×13): 10 mg via INTRAVENOUS
  Filled 2019-12-30 (×14): qty 1

## 2019-12-30 MED ORDER — SODIUM PHOSPHATES 45 MMOLE/15ML IV SOLN
30.0000 mmol | Freq: Once | INTRAVENOUS | Status: AC
  Administered 2019-12-30: 30 mmol via INTRAVENOUS
  Filled 2019-12-30: qty 10

## 2019-12-30 NOTE — Progress Notes (Addendum)
[facility] ROUNDING NOTE   Subjective:   This is a 64 year old lady with extensive cardiac history including mitral valve repair 2001 and pacemaker placement 2001 with a history of sick sinus syndrome.  She also has a history of chronic atrial fibrillation, and diabetes.  She was admitted to [facility] in April 2021 with right MCA stroke right interventional guided thrombectomy 10/31/2019 significant brain edema status post craniotomy after TPA.  During her stay in rehab she developed respiratory distress with an enlarging pleural effusion underwent thoracentesis.  She was subsequently intubated and developed hemorrhagic shock.  She required VATS procedure 12/26/2019.  This was complicated by acute kidney injury and chronic renal replacement therapy was initiated 12/26/2019.  No heparin was used secondary to hemorrhagic shock.  She continues on CRRT.  Very poor urine output noted.  Blood pressure 143/47 pulse 64 temperature 98.1 O2 sats 90% FiO2 30  Sodium 134 potassium 4.2 chloride 100 CO2 25 BUN 14 creatinine 1.21 glucose 167 calcium 7.9 phosphorus 1.8 magnesium 2.3 albumin 1.6.  Hemoglobin 7.7 WBC 15.9 platelets 123  IV amiodarone  Amantadine 100 mg twice daily, aspirin 325 mg day Lipitor 40 mg daily vitamin B complex 1/day, insulin sliding scale, Lantus 10 units daily, Protonix 40 mg daily Dilantin 100 mg every 8 hours   Objective:  Vital signs in last 24 hours:  Temp:  [97.2 F (36.2 C)-98.6 F (37 C)] 98.1 F (36.7 C) (06/15 8676) Pulse Rate:  [32-81] 60 (06/15 0638) Resp:  [17-31] 21 (06/15 0638) BP: (107-178)/(42-75) 143/47 (06/15 0630) SpO2:  [98 %-100 %] 100 % (06/15 0638) FiO2 (%):  [30 %] 30 % (06/15 0445) Weight:  [81.3 kg] 81.3 kg (06/15 0400)  Weight change: -7.6 kg Filed Weights   12/28/19 0351 12/29/19 0500 12/30/19 0400  Weight: 86.2 kg 88.9 kg 81.3 kg    Intake/Output: I/O last 3 completed shifts: In: 3822.6 [I.V.:1102.6; Other:150;  NG/GT:2170; IV Piggyback:400] Out: 9510 [Urine:75; Other:9030; Stool:75; Chest Tube:330]   Intake/Output this shift:  Total I/O In: 526.6 [I.V.:418.6; IV Piggyback:108] Out: 2846 [Urine:15; HMCNO:7096; Chest Tube:20]  General: Critically ill looking female intubated, sedated, not responding Heart:RRR, s1s2 nl, no rubs Lungs: Coarse breath sound bilateral Abdomen:soft, Non-tender, non-distended Extremities: Bilateral lower extremity edema present Dialysis Access: Left IJ temporary HD catheter, site looks clean Neurology: Sedated.   Basic Metabolic Panel: Recent Labs  Lab 12/26/19 0344 12/26/19 1530 12/27/19 0357 12/27/19 1531 12/28/19 0320 12/28/19 0320 12/28/19 1503 12/28/19 1503 12/29/19 0353 12/29/19 1508 12/30/19 0445  NA 131*   < > 134*   < > 134*  --  134*  --  133* 134* 134*  K 4.5   < > 3.9   < > 4.0  --  4.4  --  4.1 4.3 4.2  CL 100   < > 102   < > 99  --  101  --  100 101 100  CO2 15*   < > 21*   < > 22  --  22  --  _0 GLUCOSE 345*   < > 197*   < > 154*  --  220*  --  233* 206* 167*  BUN 106*   < > 70*   < > 37*  --  29*  --  _1 CREATININE 4.21*   < > 2.91*   < > 1.94*  --  1.71*  --  1.44* 1.29* 1.21*  CALCIUM 7.9*   < >  7.8*   < > 7.6*   < > 7.6*   < > 7.9* 7.8* 7.9*  MG 2.5*  --  2.3  --  2.2  --   --   --  2.3  --  2.3  PHOS 8.0*   < > 5.2*   < > 3.4  --  3.2  --  2.3* 1.9* 1.8*   < > = values in this interval not displayed.    Liver Function Tests: Recent Labs  Lab 12/25/19 0456 12/26/19 1530 12/28/19 0320 12/28/19 1503 12/29/19 0353 12/29/19 1508 12/30/19 0445  AST 71*  --   --   --   --   --   --   ALT 52*  --   --   --   --   --   --   ALKPHOS 129*  --   --   --   --   --   --   BILITOT 0.7  --   --   --   --   --   --   PROT 6.0*  --   --   --   --   --   --   ALBUMIN 1.7*   < > 1.5* 1.5* 1.7* 1.6* 1.6*   < > = values in this interval not displayed.   No results for input(s): LIPASE, AMYLASE in the last 168 hours. No  results for input(s): AMMONIA in the last 168 hours.  CBC: Recent Labs  Lab 12/26/19 0344 12/27/19 0357 12/28/19 0711 12/29/19 0353 12/30/19 0445  WBC 20.9* 18.3* 18.4* 15.9* 15.9*  HGB 9.0* 8.0* 8.1* 8.0* 7.7*  HCT 27.8* 24.4* 25.5* 25.6* 25.3*  MCV 91.1 90.7 94.4 96.6 97.7  PLT 121* 128* 149* 146* 123*    Cardiac Enzymes: No results for input(s): CKTOTAL, CKMB, CKMBINDEX, TROPONINI in the last 168 hours.  BNP: Invalid input(s): POCBNP  CBG: Recent Labs  Lab 12/29/19 0400 12/29/19 0725 12/29/19 1115 12/29/19 1508 12/30/19 0408  GLUCAP 209* 244* 220* 210* 139*    Microbiology: Results for orders placed or performed during the hospital encounter of 12/09/2019  MRSA PCR Screening     Status: None   Collection Time: 11/15/2019 11:00 AM   Specimen: Nasopharyngeal  Result Value Ref Range Status   MRSA by PCR NEGATIVE NEGATIVE Final    Comment:        The GeneXpert MRSA Assay (FDA approved for NASAL specimens only), is one component of a comprehensive MRSA colonization surveillance program. It is not intended to diagnose MRSA infection nor to guide or monitor treatment for MRSA infections. Performed at [facility], [place] [address] [place], [place] [address]   Culture, bal-quantitative     Status: Abnormal   Collection Time: 12/01/2019 11:37 AM   Specimen: Bronchoalveolar Lavage  Result Value Ref Range Status   Specimen Description BRONCHIAL ALVEOLAR LAVAGE  Final   Special Requests NONE  Final   Gram Stain   Final    FEW WBC PRESENT,BOTH PMN AND MONONUCLEAR NO ORGANISMS SEEN    Culture (A)  Final    1,000 COLONIES/mL Consistent with normal respiratory flora. Performed at [facility], [place] [address] [place], [place] [address]    Report Status 12/16/2019 FINAL  Final  Culture, blood (routine x 2)     Status: None   Collection Time: 12/18/2019  6:11 AM   Specimen: BLOOD  Result Value Ref Range Status   Specimen Description BLOOD RIGHT  HAND  Final    Special Requests   Final    BOTTLES DRAWN AEROBIC ONLY Blood Culture results may not be optimal due to an inadequate volume of blood received in culture bottles   Culture   Final    NO GROWTH 5 DAYS Performed at [facility], [place] [address] [place], [place] [address]    Report Status 12/26/2019 FINAL  Final  Culture, blood (routine x 2)     Status: None   Collection Time: 12/28/2019  6:16 AM   Specimen: BLOOD  Result Value Ref Range Status   Specimen Description BLOOD RIGHT ARM  Final   Special Requests   Final    BOTTLES DRAWN AEROBIC ONLY Blood Culture results may not be optimal due to an inadequate volume of blood received in culture bottles   Culture   Final    NO GROWTH 5 DAYS Performed at [facility], [place] [address], [place], [place] [address]    Report Status 12/26/2019 FINAL  Final  Body fluid culture     Status: None   Collection Time: 01/10/2020  3:06 PM   Specimen: PATH Cytology Pleural fluid; Body Fluid  Result Value Ref Range Status   Specimen Description PLEURAL FLUID  Final   Special Requests LEFT A  Final   Gram Stain   Final    MODERATE WBC PRESENT, PREDOMINANTLY PMN NO ORGANISMS SEEN    Culture   Final    NO GROWTH 3 DAYS Performed at [facility], [address], [place], [place] [address]    Report Status 12/25/2019 FINAL  Final  Aerobic/Anaerobic Culture (surgical/deep wound)     Status: None   Collection Time: 01/14/2020  3:06 PM   Specimen: PATH Cytology Pleural fluid; Body Fluid  Result Value Ref Range Status   Specimen Description PLEURAL  Final   Special Requests FLUID LEFT  Final   Gram Stain   Final    RARE WBC PRESENT, PREDOMINANTLY PMN NO ORGANISMS SEEN    Culture   Final    No growth aerobically or anaerobically. Performed at [facility], [place] [address] [place], [place] [address]    Report Status 12/26/2019 FINAL  Final  Culture, respiratory (non-expectorated)     Status: None   Collection Time: 12/22/19  8:27  AM   Specimen: Tracheal Aspirate; Respiratory  Result Value Ref Range Status   Specimen Description TRACHEAL ASPIRATE  Final   Special Requests Normal  Final   Gram Stain   Final    RARE WBC PRESENT, PREDOMINANTLY PMN NO ORGANISMS SEEN Performed at [facility], [place] [address] [place], [place] [address]    Culture RARE CANDIDA ALBICANS  Final   Report Status 12/25/2019 FINAL  Final    Coagulation Studies: No results for input(s): LABPROT, INR in the last 72 hours.  Urinalysis: No results for input(s): COLORURINE, LABSPEC, PHURINE, GLUCOSEU, HGBUR, BILIRUBINUR, KETONESUR, PROTEINUR, UROBILINOGEN, NITRITE, LEUKOCYTESUR in the last 72 hours.  Invalid input(s): APPERANCEUR    Imaging: DG Chest Port 1 View  Result Date: 12/29/2019 CLINICAL DATA:  Hemothorax. EXAM: PORTABLE CHEST 1 VIEW COMPARISON:  12/27/2019 FINDINGS: An endotracheal tube terminates approximately 3 cm above the carina. A left jugular catheter and left PICC both terminate over the SVC. An enteric tube courses into the abdomen with tip not imaged. A dual lead pacemaker remains in place. Three left-sided chest tubes are unchanged in position. The cardiac silhouette remains mildly enlarged.  A small, likely partially loculated left pleural effusion is unchanged. There is also likely a persistent small veiling pleural effusion in the right lung base. Asymmetric airspace opacity in the left lower lung is unchanged. Mild interstitial prominence is unchanged. No pneumothorax is identified. IMPRESSION: 1. Unchanged pleural effusions and asymmetric left lower lung consolidation or atelectasis with suspected mild pulmonary edema. 2. No pneumothorax. Electronically Signed   By: Logan Bores M.D.   On: 12/29/2019 08:14     Medications:   .  prismasol BGK 4/2.5 500 mL/hr at 12/30/19 0522  .  prismasol BGK 4/2.5 300 mL/hr at 12/30/19 0448  . sodium chloride 5 mL/hr at 12/30/19 0600  . amiodarone 30 mg/hr (12/30/19 0600)  .  dexmedetomidine (PRECEDEX) IV infusion 0.6 mcg/kg/hr (12/30/19 0600)  . feeding supplement (VITAL AF 1.2 CAL) 60 mL/hr at 12/29/19 1500  . fentaNYL infusion INTRAVENOUS 100 mcg/hr (12/30/19 0600)  . levETIRAcetam Stopped (12/29/19 2220)  . norepinephrine (LEVOPHED) Adult infusion Stopped (12/28/19 0817)  . prismasol BGK 4/2.5 1,500 mL/hr at 12/30/19 0145   . amantadine  100 mg Per Tube BID  . aspirin  325 mg Per Tube Daily  . atorvastatin  40 mg Per Tube Daily  . B-complex with vitamin C  1 tablet Per Tube Daily  . chlorhexidine gluconate (MEDLINE KIT)  15 mL Mouth Rinse BID  . Chlorhexidine Gluconate Cloth  6 each Topical Q0600  . docusate  100 mg Per Tube BID  . feeding supplement (PRO-STAT SUGAR FREE 64)  30 mL Per Tube Daily  . insulin aspart  0-20 Units Subcutaneous Q4H  . insulin aspart  6 Units Subcutaneous Q4H  . insulin glargine  10 Units Subcutaneous BID  . mouth rinse  15 mL Mouth Rinse 10 times per day  . pantoprazole (PROTONIX) IV  40 mg Intravenous Q24H  . phenytoin (DILANTIN) IV  100 mg Intravenous Q8H  . polyethylene glycol  17 g Per Tube Daily  . senna-docusate  1 tablet Per Tube BID  . sodium chloride flush  10-40 mL Intracatheter Q12H   sodium chloride, acetaminophen, bisacodyl, fentaNYL, midazolam, ondansetron (ZOFRAN) IV, oxyCODONE, senna-docusate, sodium chloride flush  Assessment/ Plan:   Acute kidney injury, oliguric ischemic ATN secondary to hemorrhagic shock requiring pressor support.  Refractory to IV diuretics initiated on CRRT 12/26/2019 left IJ temporary dialysis catheter per CCM.  Negative fluid balance on CRRT 200 cc an hour.  ANEMIA-transfusion 5 units packed red blood cells 9 units fresh frozen plasma 2 units cryoprecipitate.  Last transfusion 12/29/2019  Chronic atrial fibrillation status post pacemaker anticoagulation on hold.  Rate controlled with amiodarone  Hemorrhagic shock secondary to hemothorax status post multiple transfusions 12/16/2019.     Metabolic acidosis appears to be controlled with CRRT  Hemorrhagic right MCA infarction seizure having received thrombectomy complicated by brain edema and craniectomy 10/31/2019  Diabetes mellitus insulin sliding scale  Seizure prophylaxis continues with Dilantin  Hypophosphatemia.  We will replete with 30 mEq sodium phosphate     LOS: [place] _0 _1 :54 AM

## 2019-12-30 NOTE — Progress Notes (Addendum)
NAME:  Krystal Little, MRN:  [id number], DOB:  October 26, 1955, LOS: 65 ADMISSION DATE:  11/21/2019, CONSULTATION DATE:  5/29 REFERRING MD:  Letta Pate, CHIEF COMPLAINT:  Chest congestion   Brief History   34 female with an extensive cardiac history was admitted to [facility] in April in the setting of a right MCA stroke, had a right interventional radiology guided thrombectomy, later had significant brain edema and underwent craniectomy, ultimately transferred to inpatient rehab.  Pulmonary and critical care medicine was consulted on May 29 in the setting of tachypnea and an enlarging left-sided pleural effusion. Thoracentesis was performed which showed a hemothorax.  A CT chest was then ordered showing obstruction of her left mainstem bronchus and a loculated left pleural effusion.  On May 30 she was moved to the ICU for chest tube placement and bronchoscopy.   Past Medical History  Status post mitral valve repair 2001 Status post cardiac pacemaker 2001 History of sick sinus syndrome Hypertension Diabetes mellitus type 2 Chronic atrial fibrillation Right MCA stroke 2021  Significant Hospital Events   4/16 IR thrombectomy R MCA stroke 4/25 PCCM sign off 5/6 transfer to [facility] 5/29 PCCM consult left pleural effusion, thoracentesis 5/30 worsening tachypnea. Move to ICU for intubation to facilitate bronchoscopy and chest tube placement 5/31 TPA/ pulmonzyme started for 3 days for persistent left effusion 6/01 Afebrile, low-dose Levophed, Precedex, Minimal output on chest tube- pulmozyme / tpa x 2,  6/02 Weaned x 7 hours yesterday PSV 15/5, pulmozyme and TPA completed 3 days 6/03 afib with RVR placed on amio gtt overnight , 1U PRBC for Hb 7.1  6/05 extubated successfully, Repeat TPA/Pulmonzyme given again  6/06 acute decompensation overnight, hemorrhagic shock > to OR for left VATS with 2L hemothorax drained.  CT head with new ICH> neurosurgery consulted 6/09 More awake but  agitated on PSV weaning  6/12: Seizures + on EEG   Consults:  PCCM  Procedures:  5/29 Thoracentesis >> 1.5 L bloody fluid 5/30 ETT >  5/30 L pigtail chest tube >  Bronchoscopy 5/30 >>  large, thick, grey mucus plug completely occluding the left mainstem bronchus.  [facility] HD cath 6/12 >  Significant Diagnostic Tests:  Thoracentesis left chest 5/29 >> 8K WBC, 78% WBC, protein 4.0, LDH 182 CT chest abdomen pelvis 6/6 >> large left hemithorax, patchy right lung opacities with small right effusion.  No acute abdominal process CT head 6/6 >> evolution of right MCA infarct with hemorrhagic transformation with large intraparenchymal hematoma, 2 additional small hemorrhages in the inferior cerebellum and left temporal lobe.  Trace subarachnoid blood. CT Head 6/10 >> stable hemorrhagic R MCA.  Right hemi-craniectomy with no midline shift or mass effect.  Mildly regressed small cerebellar hemorrhages.  Stable small volume intraventricular hemorrhage, no ventriculomegaly.  Trace subarachnoid hemorrhage largely resolved.  6/12 EEG: right frontocentral seizures X2   Micro Data:   RVP 4/16 neg  MRSA PCR  4/17 neg   ======== 5/29 Left pleural fluid culture > negative 5/29 left pleural fluid cytology >> No malignant cells identified. Blood and acute inflammation.  5/30 BAL >> rare candida albicans 6/06 L Pleural Fluid >> negative 6/06 BCx2 >> negative   Antimicrobials:  Cefepime 4/24 >> 4/25 Zosyn 4/28 >> 5/4 Zosyn 5/30 >> 6/5 Cefepime 6/6 >> 6/12  Interim history/subjective:   No acute events overnight. Remains critically ill on life support and CVVHD  Objective   Blood pressure 136/68, pulse (!) 59, temperature 98.2 F (36.8 C), resp.  rate (!) 24, height _0  (1.626 m), weight 81.3 kg, SpO2 100 %.    Vent Mode: PSV;CPAP FiO2 (%):  [30 %] 30 % Set Rate:  [20 bmp] 20 bmp Vt Set:  [430 mL] 430 mL PEEP:  [5 cmH20] 5 cmH20 Pressure Support:  [15 cmH20] 15 cmH20 Plateau Pressure:  [16  cmH20-23 cmH20] 16 cmH20   Intake/Output Summary (Last 24 hours) at 12/30/2019 1035 Last data filed at 12/30/2019 0900 Gross per 24 hour  Intake 2328.11 ml  Output 6382 ml  Net -4053.89 ml   Filed Weights   12/28/19 0351 12/29/19 0500 12/30/19 0400  Weight: 86.2 kg 88.9 kg 81.3 kg    Examination:  General: Elderly appearing female intubated HEENT: Status post right-sided hemicraniectomy Neuro: Sedated CV: Regular rate rhythm S1-S2 PULM: Clear bilateral breath sounds GI: Soft, non-tender, non-distended.  Extremities: Third spaced edema dependent upper and lower extremities Skin: Grossly intact no rash  Assessment & Plan:   Status Epilepticus, likely related to underlying stroke history :EEG off 6/14 -AED management per neurology  Acute Respiratory Failure with Hypoxemia Mucus Plugging, Suspected Aspiration   Left Pleural Effusion s/p Thoracentesis, L Chest Tube  -Patient remains on adult mechanical ventilator protocol -Mental status precludes liberation from the ventilator - Continue SBT as tolerated.  -Possible need for prolonged mechanical support.  I have broached to subject of tracheostomy with her daughter, and informed them, that within the next few days there will be more serious discussion regarding prognosis.  - CVTS following for chest tube management (164m last 24 hrs). L anterior tube to be pulled today.   AKI secondary to shock - no evidence of obstruction on renal [place] Hypernatremia. 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  Evaluted by neurosurgery who felt that CTs were stable and no surgical evacuation or EVD was warranted. Neurosurgery with no plans for intervention at this time -Continue observation and protection of right hemicraniectomy site.  For neuro protection. - Neuro  checks  DM2 with Hyperglycemia -Continue SSI -CBGs -Goal blood glucose 140-180 -Lantus BID, continue current dose.   Chronic Atrial Fibrillation s/p Pacemaker -Telemetry -Holding Eliquis due to bleeding  Moderate Protein Calorie Malnutrition  -Continue tube feeds  Goals of Care P: -Full code  Resolved: - Hemorrhagic shock secondary to hemothorax  Best practice:  Diet: TF  Pain/Anxiety/Delirium protocol (if indicated): Off all continuous sedation at this time VAP protocol (if indicated): yes DVT prophylaxis: SCDs GI prophylaxis: PPI Glucose control: SSI Mobility: BR Code Status: full code Family Communication: Updated husband yesterday evening Disposition:  ICU  Critical care time 45 minutes    PGeorgann Housekeeper AGACNP-BC LHobsonfor personal pager PCCM on call pager ([phone] 12/30/2019 10:41 AM    PCCM attending:  This is a 64year old female extensive history and prolonged hospitalization.  Recently had a right MCA stroke complicated by hemorrhagic conversion of a right hemicraniectomy.  This all occurred in April after interventional radiology guided thrombectomy, brain edema.  Was discharged to rehab.  Then developed hemothorax following thoracentesis.  Patient had hemorrhagic shock multiorgan failure requiring CVVHD.  Developed status epilepticus now on LTV EEG.  Patient remains intubated on mechanical life support in the intensive care unit.  Long discussions with patient's family regarding multiple medical conditions and current prognosis.  BP (!) 146/46  Pulse 71   Temp 97.7 F (36.5 C)   Resp (!) 32   Ht _0  (1.626 m)   Wt 81.3 kg   SpO2 91%   BMI 30.77 kg/m   General: Elderly female intubated on mechanical life support HEENT: Right hemicraniectomy Heart: Regular rate rhythm S1-S2 Lungs: Bilateral mechanically ventilated breath sounds Abdomen: Soft nontender nondistended Neuro: Will not follow commands  no nystagmus no withdrawal to pain.  Labs: Reviewed  Assessment: Status epilepticus Acute metabolic toxic encephalopathy secondary to above Acute hypoxemic respiratory failure requiring intubation mechanical ventilation Left-sided pleural effusion, hemothorax, status post left chest drain Acute renal failure requiring CVVHD Recent MCA stroke status post right hemicraniectomy, complicated by intracranial hemorrhage Type 2 diabetes hyperglycemia  Plan: Patient with multiple medical problems at this time sustained life on life support, mechanical ventilator and CVVHD. Mental status is not improving. We need to continue goals of care discussion with patient's family. I think her overall prognosis is poor. We appreciate consultants input regarding cardiothoracic surgery as well as nephrology and neurology. Consult placed to palliative care. AEDs per neurology services.  This patient is critically ill with multiple organ system failure; which, requires frequent high complexity decision making, assessment, support, evaluation, and titration of therapies. This was completed through the application of advanced monitoring technologies and extensive interpretation of multiple databases. During this encounter critical care time was devoted to patient care services described in this note for 33 minutes.  Garner Nash, DO [facility] 12/30/2019 6:07 PM

## 2019-12-30 NOTE — Progress Notes (Signed)
Subjective: NAEO. No clinical seizures.    ROS: unable to obtain due to poor mental status  Examination  Vital signs in last 24 hours: Temp:  [97.2 F (36.2 C)-98.8 F (37.1 C)] 97.7 F (36.5 C) (06/15 1800) Pulse Rate:  [32-78] 71 (06/15 1800) Resp:  [15-32] 32 (06/15 1800) BP: (106-194)/(46-75) 146/46 (06/15 1800) SpO2:  [91 %-100 %] 91 % (06/15 1800) FiO2 (%):  [30 %] 30 % (06/15 1545) Weight:  [81.3 kg] 81.3 kg (06/15 0400)  General: lying in bed, not in apparent distress CVS: pulse-normal rate and rhythm RS: breathing comfortably, intubated Extremities: normal, warm  Neuro: MS: Opens eyes spontaneously, does not follow commands CN: pupils equal and reactive, right gaze preference with left side neglect, +cough reflex, rest CN difficult to assess due to intubation Motor: Withdraws to noxious stimuli in all 4 extremities with 3/5 strength in bilateral upper extremities distally and 2/5 in bilateral lower extremities  Reflexes: 1+ bilaterally over patella, biceps   Basic Metabolic Panel: Recent Labs  Lab 12/26/19 0344 12/26/19 1530 12/27/19 0357 12/27/19 1531 12/28/19 0320 12/28/19 0320 12/28/19 1503 12/28/19 1503 12/29/19 0353 12/29/19 0353 12/29/19 1508 12/30/19 0445 12/30/19 1617  NA 131*   < > 134*   < > 134*   < > 134*  --  133*  --  134* 134* 134*  K 4.5   < > 3.9   < > 4.0   < > 4.4  --  4.1  --  4.3 4.2 4.5  CL 100   < > 102   < > 99   < > 101  --  100  --  101 100 101  CO2 15*   < > 21*   < > 22   < > 22  --  25  --  24 25 25   GLUCOSE 345*   < > 197*   < > 154*   < > 220*  --  233*  --  206* 167* 185*  BUN 106*   < > 70*   < > 37*   < > 29*  --  20  --  17 14 15   CREATININE 4.21*   < > 2.91*   < > 1.94*   < > 1.71*  --  1.44*  --  1.29* 1.21* 1.26*  CALCIUM 7.9*   < > 7.8*   < > 7.6*   < > 7.6*   < > 7.9*   < > 7.8* 7.9* 7.8*  MG 2.5*  --  2.3  --  2.2  --   --   --  2.3  --   --  2.3  --   PHOS 8.0*   < > 5.2*   < > 3.4   < > 3.2  --  2.3*  --   1.9* 1.8* 2.2*   < > = values in this interval not displayed.    CBC: Recent Labs  Lab 12/26/19 0344 12/27/19 0357 12/28/19 0711 12/29/19 0353 12/30/19 0445  WBC 20.9* 18.3* 18.4* 15.9* 15.9*  HGB 9.0* 8.0* 8.1* 8.0* 7.7*  HCT 27.8* 24.4* 25.5* 25.6* 25.3*  MCV 91.1 90.7 94.4 96.6 97.7  PLT 121* 128* 149* 146* 123*     Coagulation Studies: No results for input(s): LABPROT, INR in the last 72 hours.  Imaging No new brain imaging overnight  ASSESSMENT AND PLAN: Krystal Little is a 64 year old female with recent right MCA hemorrhagic infarct status post hemicraniectomy was then noted to  have seizures and is currently on Keppra and fosphenytoin.  Right hemorrhagic MCA infarct status post hemicraniectomy Intraventricular hemorrhage Seizures post stroke - Continues to wake up more, has left sided neglect, not following commands yet  Recommendations -Continue current AEDs: Keppra 750 mg twice daily, Dilantin 100 mg every 8 hours ( Corrected Dilantin level on 12/29/2019 19.4) -Continue seizure precautions -Continue to minimize sedation per critical care team -As needed IV Ativan 2 mg for clinical seizure-like activity -We will defer management of rest of comorbidities to primary team   I have spent a total of 25  minutes with the patient reviewing hospital notes,  test results, labs and examining the patient as well as establishing an assessment and plan that was discussed personally with the patient's RN. > 50% of time was spent in direct patient care.

## 2019-12-30 NOTE — Progress Notes (Signed)
9 Days Post-Op Procedure(s) (LRB): Video Assisted Thoracoscopy (Vats) left , Drainage of Hemothorax (Left) Video Bronchoscopy (N/A) Subjective: Patient remains on CRRT with no purposeful movement or response Left pleural tube drainage has reduced and we will remove anterior tube today Today's chest x-ray image reviewed and shows good aeration of left lung with postoperative changes  Objective: Vital signs in last 24 hours: Temp:  [97.2 F (36.2 C)-98.6 F (37 C)] 98.2 F (36.8 C) (06/15 0900) Pulse Rate:  [32-81] 59 (06/15 0900) Cardiac Rhythm: Ventricular paced (06/15 0800) Resp:  [17-31] 24 (06/15 0900) BP: (107-178)/(42-75) 136/68 (06/15 0900) SpO2:  [98 %-100 %] 100 % (06/15 0900) FiO2 (%):  [30 %] 30 % (06/15 0800) Weight:  [81.3 kg] 81.3 kg (06/15 0400)  Hemodynamic parameters for last 24 hours:    Intake/Output from previous day: 06/14 0701 - 06/15 0700 In: 1684.8 [I.V.:816.8; NG/GT:660; IV Piggyback:208] Out: 7342 [Urine:40; Chest Tube:160] Intake/Output this shift: Total I/O In: 1014.6 [I.V.:78.4; NG/GT:890; IV Piggyback:46.2] Out: 562 [Other:562]  Exam Sinus rhythm No murmur Breath sounds equal No air leak from chest tubes Lab Results: Recent Labs    12/29/19 0353 12/30/19 0445  WBC 15.9* 15.9*  HGB 8.0* 7.7*  HCT 25.6* 25.3*  PLT 146* 123*   BMET:  Recent Labs    12/29/19 1508 12/30/19 0445  NA 134* 134*  K 4.3 4.2  CL 101 100  CO2 24 25  GLUCOSE 206* 167*  BUN 17 14  CREATININE 1.29* 1.21*  CALCIUM 7.8* 7.9*    PT/INR: No results for input(s): LABPROT, INR in the last 72 hours. ABG    Component Value Date/Time   PHART 7.403 12/22/2019 0348   HCO3 22.8 12/22/2019 0348   TCO2 24 12/22/2019 0348   ACIDBASEDEF 2.0 12/22/2019 0348   O2SAT 99.0 12/22/2019 0348   CBG (last 3)  Recent Labs    12/29/19 1115 12/29/19 1508 12/30/19 0408  GLUCAP 220* 210* 139*    Assessment/Plan: S/P Procedure(s) (LRB): Video Assisted Thoracoscopy  (Vats) left , Drainage of Hemothorax (Left) Video Bronchoscopy (N/A) DC left anterior chest tube, leave the remaining drains to suction   LOS: 16 days    Krystal Little 12/30/2019

## 2019-12-30 NOTE — Progress Notes (Signed)
Patient placed on full vent support due to increased WOB and RR.

## 2019-12-31 ENCOUNTER — Inpatient Hospital Stay (HOSPITAL_COMMUNITY): Payer: Medicare Other

## 2019-12-31 DIAGNOSIS — J690 Pneumonitis due to inhalation of food and vomit: Principal | ICD-10-CM

## 2019-12-31 DIAGNOSIS — Z515 Encounter for palliative care: Secondary | ICD-10-CM

## 2019-12-31 DIAGNOSIS — Z7189 Other specified counseling: Secondary | ICD-10-CM

## 2019-12-31 DIAGNOSIS — R6521 Severe sepsis with septic shock: Secondary | ICD-10-CM

## 2019-12-31 DIAGNOSIS — A419 Sepsis, unspecified organism: Secondary | ICD-10-CM

## 2019-12-31 DIAGNOSIS — Z789 Other specified health status: Secondary | ICD-10-CM

## 2019-12-31 DIAGNOSIS — I63411 Cerebral infarction due to embolism of right middle cerebral artery: Secondary | ICD-10-CM

## 2019-12-31 LAB — GLUCOSE, CAPILLARY
Glucose-Capillary: 132 mg/dL — ABNORMAL HIGH (ref 70–99)
Glucose-Capillary: 133 mg/dL — ABNORMAL HIGH (ref 70–99)
Glucose-Capillary: 148 mg/dL — ABNORMAL HIGH (ref 70–99)
Glucose-Capillary: 158 mg/dL — ABNORMAL HIGH (ref 70–99)
Glucose-Capillary: 161 mg/dL — ABNORMAL HIGH (ref 70–99)
Glucose-Capillary: 171 mg/dL — ABNORMAL HIGH (ref 70–99)
Glucose-Capillary: 184 mg/dL — ABNORMAL HIGH (ref 70–99)
Glucose-Capillary: 87 mg/dL (ref 70–99)
Glucose-Capillary: 93 mg/dL (ref 70–99)

## 2019-12-31 LAB — RENAL FUNCTION PANEL
Albumin: 1.7 g/dL — ABNORMAL LOW (ref 3.5–5.0)
Albumin: 1.6 g/dL — ABNORMAL LOW (ref 3.5–5.0)
Anion gap: 8 (ref 5–15)
Anion gap: 11 (ref 5–15)
BUN: 15 mg/dL (ref 8–23)
BUN: 17 mg/dL (ref 8–23)
CO2: 24 mmol/L (ref 22–32)
CO2: 22 mmol/L (ref 22–32)
Calcium: 7.8 mg/dL — ABNORMAL LOW (ref 8.9–10.3)
Calcium: 7.6 mg/dL — ABNORMAL LOW (ref 8.9–10.3)
Chloride: 102 mmol/L (ref 98–111)
Chloride: 102 mmol/L (ref 98–111)
Creatinine, Ser: 1.15 mg/dL — ABNORMAL HIGH (ref 0.44–1.00)
Creatinine, Ser: 1.33 mg/dL — ABNORMAL HIGH (ref 0.44–1.00)
GFR calc Af Amer: 58 mL/min — ABNORMAL LOW (ref >60)
GFR calc Af Amer: 48 mL/min — ABNORMAL LOW (ref >60)
GFR calc non Af Amer: 50 mL/min — ABNORMAL LOW (ref >60)
GFR calc non Af Amer: 42 mL/min — ABNORMAL LOW (ref >60)
Glucose, Bld: 151 mg/dL — ABNORMAL HIGH (ref 70–99)
Glucose, Bld: 158 mg/dL — ABNORMAL HIGH (ref 70–99)
Phosphorus: 2.3 mg/dL — ABNORMAL LOW (ref 2.5–4.6)
Phosphorus: 3.9 mg/dL (ref 2.5–4.6)
Potassium: 4.9 mmol/L (ref 3.5–5.1)
Potassium: 5.5 mmol/L — ABNORMAL HIGH (ref 3.5–5.1)
Sodium: 134 mmol/L — ABNORMAL LOW (ref 135–145)
Sodium: 135 mmol/L (ref 135–145)

## 2019-12-31 LAB — BASIC METABOLIC PANEL
Anion gap: 12 (ref 5–15)
BUN: 19 mg/dL (ref 8–23)
CO2: 22 mmol/L (ref 22–32)
Calcium: 7.8 mg/dL — ABNORMAL LOW (ref 8.9–10.3)
Chloride: 100 mmol/L (ref 98–111)
Creatinine, Ser: 1.19 mg/dL — ABNORMAL HIGH (ref 0.44–1.00)
GFR calc Af Amer: 55 mL/min — ABNORMAL LOW (ref >60)
GFR calc non Af Amer: 48 mL/min — ABNORMAL LOW (ref >60)
Glucose, Bld: 231 mg/dL — ABNORMAL HIGH (ref 70–99)
Potassium: 5.1 mmol/L (ref 3.5–5.1)
Sodium: 134 mmol/L — ABNORMAL LOW (ref 135–145)

## 2019-12-31 LAB — CBC
HCT: 28.2 % — ABNORMAL LOW (ref 36.0–46.0)
Hemoglobin: 8.5 g/dL — ABNORMAL LOW (ref 12.0–15.0)
MCH: 29.8 pg (ref 26.0–34.0)
MCHC: 30.1 g/dL (ref 30.0–36.0)
MCV: 98.9 fL (ref 80.0–100.0)
Platelets: 130 10*3/uL — ABNORMAL LOW (ref 150–400)
RBC: 2.85 MIL/uL — ABNORMAL LOW (ref 3.87–5.11)
RDW: 21.7 % — ABNORMAL HIGH (ref 11.5–15.5)
WBC: 21.4 10*3/uL — ABNORMAL HIGH (ref 4.0–10.5)
nRBC: 0.1 % (ref 0.0–0.2)

## 2019-12-31 LAB — MAGNESIUM: Magnesium: 2.4 mg/dL (ref 1.7–2.4)

## 2019-12-31 MED ORDER — POLYETHYLENE GLYCOL 3350 17 G PO PACK
17.0000 g | PACK | Freq: Three times a day (TID) | ORAL | Status: DC
  Administered 2019-12-31 – 2020-01-27 (×22): 17 g
  Filled 2019-12-31 (×33): qty 1

## 2019-12-31 MED ORDER — INSULIN GLARGINE 100 UNIT/ML ~~LOC~~ SOLN
5.0000 [IU] | Freq: Two times a day (BID) | SUBCUTANEOUS | Status: DC
  Administered 2019-12-31 – 2020-01-08 (×17): 5 [IU] via SUBCUTANEOUS
  Filled 2019-12-31 (×19): qty 0.05

## 2019-12-31 MED ORDER — SODIUM PHOSPHATES 45 MMOLE/15ML IV SOLN
10.0000 mmol | Freq: Once | INTRAVENOUS | Status: AC
  Administered 2019-12-31: 10 mmol via INTRAVENOUS
  Filled 2019-12-31: qty 3.33

## 2019-12-31 NOTE — Consult Note (Addendum)
Consultation Note Date: 12/31/2019   Patient Name: Krystal Little  DOB: 08-25-1955  MRN: [id number]  Age / Sex: 64 y.o., female  PCP: Krystal Klein, MD Referring Physician: Garner Nash, DO  Reason for Consultation: Establishing goals of care  HPI/Patient Profile: 64 y.o. 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 Patient had some progression in rehab- was able to sit up in wheelchair, using hemiwalker for limited mobility, dunking basketballs, speech was improving however significant cognitive impairments remained- notably her ability to take in oral nutrition was impaired by prolonged mastication and persistent oral residue, however no overt signs of aspiration. She developed SOB on 5/28 and was found to have pleural effusion on chest xray. PCCM conducted thoracentesis on 5/29 that yielded 1570m of bloody fluid. Shortness of breath persisted and CT scan showed obstruction of her L mainstem bronchus (possibly due to aspiration) with loculated pleural effusion and [facility].  She developed septic (r/t to aspiration pneumonia and hemothorax) shock and had worsening renal function and hypotension requiring levophed. Extubated on 6/4. 6/6 had episode of severe hypotension, altered mental status, reintubated, restarted on pressors- had large volume output of blood via chestube  resulting in hemorrhagic shock. 6/6 had VATS and bronchoscopy to drain hemothorax and clear secretions. 6/6 CT scan head positive for new hemorrhages- no role for neurosurgery. CVVHD started on 6/11 due to progressive worsening renal function. 6/12 EEG noted seizures. She remains virtually unresponsive- spouse and RN did report some purposeful upper extremity movements 6/15. Palliative medicine consulted for assistance with goals of care.       Clinical Assessment and Goals of Care: Upon entering room patient's spouse was in discussion with Dr. YHortense Ramaland it was clear that he is very optimistic regarding patient's possibility of full recovery.  I sat down with patient's spouse and attempted life review- [facility] She had two children- son Krystal Chamberwas killed in a car accident when he was 237 Daughter Krystal Napnow owns a s[employer] All family members have been college basketball stars.  Patient's hospitalization course up to her ICU stay was reviewed. Krystal Little "Krystal Little was very focused on "what happened" on the fourth floor prior to her readmission to the ICU. We discussed with illustrations her hemothorax and how anticoagulation contributed to this as well as possibility of aspiration or other unknown event.  A great amount of time was spent on reflective listening and answering Mr. JGuitronquestions related to Krystal JSkowhospitalization by showing Mr. JMontijoher medical records at bedside.  This visit  focused on relationship building.  Krystal Little shared that he believes in natural herbs as a complement to Krystal Little and had ordered some to give to Krystal Little when she woke up. He also has very strong faith based beliefs for miracle healing. During our conversation he noted that he didn't want information sugar coated, however, he also stated he did not want to hear anything negative. He was not receptive when I pointed out the contradictions in these  statements. He is very intent in his belief that Krystal Little is experiencing a myriad of situations that can each be remedied one by one and is resistant to anyone who does not have this same view. He believes that if anyone comes in the room believing differently then they will create that reality.  Mr. Ramthun goals of care are to continue full aggressive care until Krystal Little wakes up and walks out of the hospital.   Primary Decision Maker NEXT OF KIN- spouse- Krystal Little    SUMMARY OF RECOMMENDATIONS -PMT will continue to relationship build- it was not an appropriate time to challenge patient's family on goals during this visit -Plan to visit with daughter  -Patient's spouse believes that patient has a myriad of problems that can be resolved one by one but needs to come to the realization of how they are all related together and affect one another- will work towards this goal- however will likely be difficult as this seems to be a faith based belief- it may be that providers will have to set limits     Code Status/Advance Care Planning:  Full code  Primary Diagnoses: Present on Admission: **None**   I have reviewed the medical record, interviewed the patient and family, and examined the patient. The following aspects are pertinent.  Past Medical History:  Diagnosis Date   Chronic atrial fibrillation ([place]) 1990s   s/p DCCV then attempted ablation of complex A Flutter (@ [facility] - Dr. Deno Etienne), failed antiarrhythmics --> INR folllwed @ [facility] ->> now status post pacemaker placement with underlying A. fib.   Diabetes mellitus type 2, uncontrolled, without complications    On Oral Medications ([place] [facility])   Essential hypertension    History of cardiac catheterization 2001   R&LHC - normal Coronaries, no evidence of Restictive Cardiomyopathy or Constrictive Pericarditis (also @ [facility] [place])   Hx of sick sinus syndrome 07/1999   Wtih symptomatic bradycardia - syncope  (Tachy-Brady)   S/P MVR (mitral valve repair) 08/09/1999   H/o Rheumativ MV disease with Proloapse & Mod-Severe MR --> Ant&Post Leaflet resection/repair wiht Ring Annuloplasty;; Echo 9/'14: MV ring prosthesis well seated, mild restriction of Post MV leaflet, Mlid MR w/o MS, EF 50-55% - Gr1 DD, severe LA dilation, Mod-severe RA dilation, trivial AI & Mod TR (PAP ~35 mmHg)   S/P placement of cardiac pacemaker 07/1999   Social History   Socioeconomic History   Marital status: Married    Spouse name: Riki Rusk    Number of children: 2   Years of education: 16 years.   Highest education level: Not on file  Occupational History   Occupation: product development    Employer: [employer]    Comment: prev worked as Education officer, environmental   Tobacco Use   Smoking status: Never Smoker   Smokeless tobacco: Never Used  Scientific laboratory technician Use: Never used  Substance and Sexual Activity   Alcohol use: Yes    Alcohol/week: 0.0 standard drinks  Comment: occasional, once every month or two- glass of wine    Drug use: No   Sexual activity: Not Currently    Partners: Male    Comment: 1st intercourse 57 yo-5 partners  Other Topics Concern   Not on file  Social History Narrative   Lives with husband, great-niece (27)  and great-nephew (16).    Daughter 51  yo 4th year college basketball player in [place].   1 son died in an automobile accident @ age 59 (2010).    She raised several nieces & nephews - now lives with her 54 y/o daughter (who recently moved back in)   Routinely walks 2-3 days/week.   Started new job through the [employer].   Social Determinants of Health   Financial Resource Strain:    Difficulty of Paying Living Expenses:   Food Insecurity:    Worried About Charity fundraiser in the Last Year:    Arboriculturist in the Last Year:   Transportation Needs:    Film/video editor (Medical):    Lack of Transportation  (Non-Medical):   Physical Activity:    Days of Exercise per Week:    Minutes of Exercise per Session:   Stress:    Feeling of Stress :   Social Connections:    Frequency of Communication with Friends and Family:    Frequency of Social Gatherings with Friends and Family:    Attends Religious Services:    Active Member of Clubs or Organizations:    Attends Music therapist:    Marital Status:    Family History  Problem Relation Age of Onset   Hypertension Mother    Diabetes Sister    Cancer Brother        Rectal   Lung cancer Brother    Scheduled Meds:  amantadine  100 mg Per Tube BID   aspirin  325 mg Per Tube Daily   atorvastatin  40 mg Per Tube Daily   B-complex with vitamin C  1 tablet Per Tube Daily   chlorhexidine gluconate (MEDLINE KIT)  15 mL Mouth Rinse BID   Chlorhexidine Gluconate Cloth  6 each Topical Q0600   docusate  100 mg Per Tube BID   feeding supplement (PRO-STAT SUGAR FREE 64)  30 mL Per Tube Daily   insulin aspart  0-20 Units Subcutaneous Q4H   insulin glargine  5 Units Subcutaneous BID   mouth rinse  15 mL Mouth Rinse 10 times per day   pantoprazole (PROTONIX) IV  40 mg Intravenous Q24H   phenytoin (DILANTIN) IV  100 mg Intravenous Q8H   polyethylene glycol  17 g Per Tube Daily   senna-docusate  1 tablet Per Tube BID   sodium chloride flush  10-40 mL Intracatheter Q12H   Continuous Infusions:   prismasol BGK 4/2.5 500 mL/hr at 12/31/19 1313    prismasol BGK 4/2.5 300 mL/hr at 12/30/19 0448   sodium chloride Stopped (12/30/19 2139)   amiodarone 30 mg/hr (12/31/19 1600)   dexmedetomidine (PRECEDEX) IV infusion Stopped (12/31/19 1428)   feeding supplement (VITAL AF 1.2 CAL) 1,000 mL (12/31/19 1146)   fentaNYL infusion INTRAVENOUS Stopped (12/31/19 1440)   levETIRAcetam Stopped (12/31/19 0932)   norepinephrine (LEVOPHED) Adult infusion Stopped (12/28/19 0817)   prismasol BGK 4/2.5 1,500 mL/hr at  12/31/19 1610   PRN Meds:.sodium chloride, acetaminophen, bisacodyl, fentaNYL, hydrALAZINE, midazolam, ondansetron (ZOFRAN) IV, oxyCODONE, senna-docusate, sodium chloride flush Medications Prior to Admission:  Prior to Admission  medications   Medication Sig Start Date End Date Taking? Authorizing Provider  acetaminophen (TYLENOL) 500 MG tablet Take 500-1,000 mg by mouth every 6 (six) hours as needed for headache (pain).    [provider]  amantadine (SYMMETREL) 50 MG/5ML solution Place 10 mLs (100 mg total) into feeding tube 2 (two) times daily. 11/20/19   Donzetta Starch, NP  Amino Acids-Protein Hydrolys (FEEDING SUPPLEMENT, PRO-STAT SUGAR FREE 64,) LIQD Take 30 mLs by mouth 2 (two) times daily. 11/20/19   Donzetta Starch, NP  aspirin 325 MG tablet Place 1 tablet (325 mg total) into feeding tube daily. 11/21/19   Donzetta Starch, NP  atorvastatin (LIPITOR) 40 MG tablet Place 1 tablet (40 mg total) into feeding tube daily. 11/21/19   Donzetta Starch, NP  Chlorhexidine Gluconate Cloth 2 % PADS Apply 6 each topically daily at 6 (six) AM. 11/21/19   Donzetta Starch, NP  chlorhexidine gluconate, MEDLINE KIT, (PERIDEX) 0.12 % solution 15 mLs by Mouth Rinse route 2 (two) times daily. 11/20/19   Donzetta Starch, NP  diltiazem (CARDIZEM) 10 mg/ml oral suspension Place 6 mLs (60 mg total) into feeding tube every 6 (six) hours. 11/20/19   Donzetta Starch, NP  enoxaparin (LOVENOX) 40 MG/0.4ML injection Inject 0.4 mLs (40 mg total) into the skin daily. 11/20/19   Donzetta Starch, NP  insulin aspart (NOVOLOG) 100 UNIT/ML injection Inject 0-9 Units into the skin 3 (three) times daily with meals. 11/20/19   Donzetta Starch, NP  insulin aspart (NOVOLOG) 100 UNIT/ML injection Inject 8 Units into the skin 2 (two) times daily. 11/20/19   Donzetta Starch, NP  insulin glargine (LANTUS) 100 UNIT/ML injection Inject 0.2 mLs (20 Units total) into the skin 2 (two) times daily. 11/20/19   Donzetta Starch, NP  Maltodextrin-Xanthan Gum (RESOURCE  THICKENUP CLEAR) POWD Take 120 g by mouth as needed (nectar thick liquids). 11/20/19   Donzetta Starch, NP  metoprolol tartrate (LOPRESSOR) 25 MG tablet Place 1 tablet (25 mg total) into feeding tube 2 (two) times daily. 11/20/19   Donzetta Starch, NP  Nutritional Supplements (FEEDING SUPPLEMENT, GLUCERNA 1.5 CAL,) LIQD Place 500 mLs into feeding tube daily. 11/20/19   Donzetta Starch, NP  polyethylene glycol (MIRALAX / GLYCOLAX) 17 g packet Place 17 g into feeding tube 2 (two) times daily. 11/20/19   Donzetta Starch, NP  senna-docusate (SENOKOT-S) 8.6-50 MG tablet Place 1 tablet into feeding tube at bedtime as needed for mild constipation. 11/20/19   Donzetta Starch, NP  senna-docusate (SENOKOT-S) 8.6-50 MG tablet Place 1 tablet into feeding tube 2 (two) times daily. 11/20/19   Donzetta Starch, NP   No Known Allergies Review of Systems  Unable to perform ROS   Physical Exam Vitals and nursing note reviewed.  Constitutional:      Appearance: She is ill-appearing and diaphoretic.  Cardiovascular:     Rate and Rhythm: Tachycardia present.  Pulmonary:     Comments: tachypneic on vent Neurological:     Comments: nonresponsive     Vital Signs: BP (!) 119/45    Pulse 63    Temp 98.6 F (37 C)    Resp (!) 40    Ht '5\' 4"'  (1.626 m)    Wt 73.2 kg    SpO2 97%    BMI 27.70 kg/m  Pain Scale: CPOT POSS *See Group Information*: 2-Acceptable,Slightly drowsy, easily aroused Pain Score: 0-No pain   SpO2: SpO2: 97 %  O2 Device:SpO2: 97 % O2 Flow Rate: .O2 Flow Rate (L/min): 4 L/min  IO: Intake/output summary:   Intake/Output Summary (Last 24 hours) at 12/31/2019 1631 Last data filed at 12/31/2019 1600 Gross per 24 hour  Intake 2980.97 ml  Output 5020 ml  Net -2039.03 ml    LBM: Last BM Date: (P) 12/30/19 Baseline Weight: Weight: 61.3 kg Most recent weight: Weight: 73.2 kg     Palliative Assessment/Data: PPS: 10%     Thank you for this consult. Palliative medicine will continue to follow and assist as  needed.   Time In: 1500 Time Out: 1630 Time Total: 90 mins Greater than 50%  of this time was spent counseling and coordinating care related to the above assessment and plan.  Signed by: Mariana Kaufman, AGNP-C Palliative Medicine    Please contact Palliative Medicine Team phone at [phone] for questions and concerns.  For individual provider: See Shea Evans

## 2019-12-31 NOTE — Progress Notes (Addendum)
[facility] DAILY ICU PROGRESS NOTE                   [address]            [place] [address]          [phone]   10 Days Post-Op Procedure(s) (LRB): Video Assisted Thoracoscopy (Vats) left , Drainage of Hemothorax (Left) Video Bronchoscopy (N/A)  Total Length of Stay:  LOS: 17 days   Subjective: Intubated, non-responsive  Objective: Vital signs in last 24 hours: Temp:  [96.6 F (35.9 C)-98.8 F (37.1 C)] 97.7 F (36.5 C) (06/16 0900) Pulse Rate:  [60-78] 61 (06/16 0900) Cardiac Rhythm: Normal sinus rhythm;Ventricular paced (06/16 0800) Resp:  [15-38] 33 (06/16 0900) BP: (106-194)/(43-81) 148/49 (06/16 0900) SpO2:  [91 %-100 %] 92 % (06/16 0900) FiO2 (%):  [30 %-50 %] 30 % (06/16 0800) Weight:  [73.2 kg] 73.2 kg (06/16 0335)  Filed Weights   12/29/19 0500 12/30/19 0400 12/31/19 0335  Weight: 88.9 kg 81.3 kg 73.2 kg    Weight change: -8.1 kg   Hemodynamic parameters for last 24 hours:    Intake/Output from previous day: 06/15 0701 - 06/16 0700 In: 4210.4 [I.V.:804.5; NG/GT:2930; IV Piggyback:476] Out: 4553 [Urine:20; Stool:20; Chest Tube:80]  Intake/Output this shift: Total I/O In: 234.5 [I.V.:76.7; NG/GT:120; IV Piggyback:37.9] Out: 382 [Other:382]  Current Meds: Scheduled Meds: . amantadine  100 mg Per Tube BID  . aspirin  325 mg Per Tube Daily  . atorvastatin  40 mg Per Tube Daily  . B-complex with vitamin C  1 tablet Per Tube Daily  . chlorhexidine gluconate (MEDLINE KIT)  15 mL Mouth Rinse BID  . Chlorhexidine Gluconate Cloth  6 each Topical Q0600  . docusate  100 mg Per Tube BID  . feeding supplement (PRO-STAT SUGAR FREE 64)  30 mL Per Tube Daily  . insulin aspart  0-20 Units Subcutaneous Q4H  . insulin aspart  6 Units Subcutaneous Q4H  . insulin glargine  10 Units Subcutaneous BID  . mouth rinse  15 mL Mouth Rinse 10 times per day  . pantoprazole (PROTONIX) IV  40 mg Intravenous Q24H  . phenytoin (DILANTIN) IV  100 mg Intravenous  Q8H  . polyethylene glycol  17 g Per Tube Daily  . senna-docusate  1 tablet Per Tube BID  . sodium chloride flush  10-40 mL Intracatheter Q12H   Continuous Infusions: .  prismasol BGK 4/2.5 500 mL/hr at 12/30/19 1102  .  prismasol BGK 4/2.5 300 mL/hr at 12/30/19 0448  . sodium chloride Stopped (12/30/19 2139)  . amiodarone 30 mg/hr (12/31/19 0900)  . dexmedetomidine (PRECEDEX) IV infusion 0.6 mcg/kg/hr (12/31/19 0900)  . feeding supplement (VITAL AF 1.2 CAL) 60 mL/hr at 12/30/19 1900  . fentaNYL infusion INTRAVENOUS 100 mcg/hr (12/31/19 0900)  . levETIRAcetam 750 mg (12/31/19 0917)  . norepinephrine (LEVOPHED) Adult infusion Stopped (12/28/19 0817)  . prismasol BGK 4/2.5 1,500 mL/hr at 12/31/19 0846  . sodium phosphate  Dextrose 5% IVPB 42 mL/hr at 12/31/19 0900   PRN Meds:.sodium chloride, acetaminophen, bisacodyl, fentaNYL, hydrALAZINE, midazolam, ondansetron (ZOFRAN) IV, oxyCODONE, senna-docusate, sodium chloride flush  General appearance: no distress and intubated/sedated Heart: regular rate and rhythm, S1, S2 normal, no murmur, click, rub or gallop Lungs: coarse breath sounds Extremities: 2+ pitting edema bilaterally  Lab Results: CBC: Recent Labs    12/30/19 0445 12/31/19 0347  WBC 15.9* 21.4*  HGB 7.7* 8.5*  HCT 25.3* 28.2*  PLT 123* 130*  BMET:  Recent Labs    12/30/19 1617 12/31/19 0347  NA 134* 134*  K 4.5 4.9  CL 101 102  CO2 25 24  GLUCOSE 185* 151*  BUN 15 15  CREATININE 1.26* 1.15*  CALCIUM 7.8* 7.8*    CMET: Lab Results  Component Value Date   WBC 21.4 (H) 12/31/2019   HGB 8.5 (L) 12/31/2019   HCT 28.2 (L) 12/31/2019   PLT 130 (L) 12/31/2019   GLUCOSE 151 (H) 12/31/2019   CHOL 239 (H) 11/01/2019   TRIG 152 (H) 12/16/2019   HDL 69 11/01/2019   LDLDIRECT 107 (H) 10/02/2012   LDLCALC 158 (H) 11/01/2019   ALT 52 (H) 12/25/2019   AST 71 (H) 12/25/2019   NA 134 (L) 12/31/2019   K 4.9 12/31/2019   CL 102 12/31/2019   CREATININE 1.15 (H)  12/31/2019   BUN 15 12/31/2019   CO2 24 12/31/2019   TSH 1.414 03/19/2013   INR 1.1 12/22/2019   HGBA1C 9.7 (H) 11/01/2019      PT/INR: No results for input(s): LABPROT, INR in the last 72 hours. Radiology: No results found.   Assessment/Plan: S/P Procedure(s) (LRB): Video Assisted Thoracoscopy (Vats) left , Drainage of Hemothorax (Left) Video Bronchoscopy (N/A)  1. Anterior chest tube removed yesterday. Keep remaining chest tubes. 80cc/24 hours.  2. CXR shows: Partially loculated moderate left pleural effusion with 2 left chest tubes in place, status post left thoracotomy for hemothorax evacuation 12/18/2019. No pneumothorax. Mixed interstitial and airspace opacification bilaterally with interval worsening in aeration in the right lower lobe. Findings may be due to a combination of atelectasis and pneumonia. Aspiration is not excluded.Moderate right pleural effusion, increased. 3. Poor prognosis with palliative consult   Plan: She has an increasing right pleural effusion. May need a thoracentesis at the bedside if continues to accumulate. Palliative care meeting with family today to review goals of care since prognosis is poor.    Krystal Little 12/31/2019 9:29 AM   Leave chest tubes in place -L pleural effusion drained patient examined and medical record reviewed,agree with above note. Krystal Little 12/31/2019

## 2019-12-31 NOTE — Progress Notes (Signed)
[facility] ROUNDING NOTE   Subjective:   This is a 64 year old lady with extensive cardiac history including mitral valve repair 2001 and pacemaker placement 2001 with a history of sick sinus syndrome.  She also has a history of chronic atrial fibrillation, and diabetes.  She was admitted to [facility] in April 2021 with right MCA stroke right interventional guided thrombectomy 10/31/2019 significant brain edema status post craniotomy after TPA.  During her stay in rehab she developed respiratory distress with an enlarging pleural effusion underwent thoracentesis.  She was subsequently intubated and developed hemorrhagic shock.  She required VATS procedure 12/17/2019.  This was complicated by acute kidney injury and chronic renal replacement therapy was initiated 12/26/2019.  No heparin was used secondary to hemorrhagic shock.  She continues on CRRT.  Very poor urine output noted.  Removal of left anterior chest tube 12/30/2019.  Appreciate assistance of Dr. Prescott Gum  Blood pressure 139/45 pulse 60 temperature 96 O2 sats 97% FiO2 40%  Ultrafiltration managing to pull 100 to 150 cc an  Sodium 134 potassium 4.9 CO2 24 chloride 102 glucose 151 BUN 15 creatinine 1.15 calcium 7.8 phosphorus 2.3 magnesium 2.4 albumin 1.7 WBC 21.4 hemoglobin 8.5 platelets 130  IV amiodarone IV Keppra 750 mg every 12 hours  Amantadine 100 mg twice daily, aspirin 325 mg day Lipitor 40 mg daily vitamin B complex 1/day, insulin sliding scale, Lantus 10 units daily, Protonix 40 mg daily Dilantin 100 mg every 8 hours   Objective:  Vital signs in last 24 hours:  Temp:  [96.6 F (35.9 C)-98.8 F (37.1 C)] 96.8 F (36 C) (06/16 0600) Pulse Rate:  [59-78] 60 (06/16 0600) Resp:  [15-38] 25 (06/16 0600) BP: (106-194)/(43-81) 147/56 (06/16 0600) SpO2:  [91 %-100 %] 97 % (06/16 0600) FiO2 (%):  [30 %-50 %] 40 % (06/16 0400) Weight:  [73.2 kg] 73.2 kg (06/16 0335)  Weight change: -8.1 kg Filed  Weights   12/29/19 0500 12/30/19 0400 12/31/19 0335  Weight: 88.9 kg 81.3 kg 73.2 kg    Intake/Output: I/O last 3 completed shifts: In: 3869.2 [I.V.:1143.2; NG/GT:2150; IV Piggyback:576] Out: 1275 [Urine:40; TZGYF:7494; Chest Tube:160]   Intake/Output this shift:  Total I/O In: 1206.7 [I.V.:438.7; NG/GT:660; IV Piggyback:108] Out: 1890 [Urine:20; Other:1770; Stool:20; Chest Tube:80]  General: Critically ill looking female intubated, sedated, not responding Heart:RRR, s1s2 nl, no rubs Lungs: Coarse breath sound bilateral Abdomen:soft, Non-tender, non-distended Extremities: Bilateral lower extremity edema present Dialysis Access: Left IJ temporary HD catheter, site looks clean Neurology: Sedated.   Basic Metabolic Panel: Recent Labs  Lab 12/27/19 0357 12/27/19 1531 12/28/19 0320 12/28/19 1503 12/29/19 0353 12/29/19 0353 12/29/19 1508 12/29/19 1508 12/30/19 0445 12/30/19 1617 12/31/19 0347  NA 134*   < > 134*   < > 133*  --  134*  --  134* 134* 134*  K 3.9   < > 4.0   < > 4.1  --  4.3  --  4.2 4.5 4.9  CL 102   < > 99   < > 100  --  101  --  100 101 102  CO2 21*   < > 22   < > 25  --  24  --  _0 GLUCOSE 197*   < > 154*   < > 233*  --  206*  --  167* 185* 151*  BUN 70*   < > 37*   < > 20  --  17  --  14  15 15  CREATININE 2.91*   < > 1.94*   < > 1.44*  --  1.29*  --  1.21* 1.26* 1.15*  CALCIUM 7.8*   < > 7.6*   < > 7.9*   < > 7.8*   < > 7.9* 7.8* 7.8*  MG 2.3  --  2.2  --  2.3  --   --   --  2.3  --  2.4  PHOS 5.2*   < > 3.4   < > 2.3*  --  1.9*  --  1.8* 2.2* 2.3*   < > = values in this interval not displayed.    Liver Function Tests: Recent Labs  Lab 12/25/19 0456 12/26/19 1530 12/29/19 0353 12/29/19 1508 12/30/19 0445 12/30/19 1617 12/31/19 0347  AST 71*  --   --   --   --   --   --   ALT 52*  --   --   --   --   --   --   ALKPHOS 129*  --   --   --   --   --   --   BILITOT 0.7  --   --   --   --   --   --   PROT 6.0*  --   --   --   --   --   --    ALBUMIN 1.7*   < > 1.7* 1.6* 1.6* 1.7* 1.7*   < > = values in this interval not displayed.   No results for input(s): LIPASE, AMYLASE in the last 168 hours. No results for input(s): AMMONIA in the last 168 hours.  CBC: Recent Labs  Lab 12/27/19 0357 12/28/19 0711 12/29/19 0353 12/30/19 0445 12/31/19 0347  WBC 18.3* 18.4* 15.9* 15.9* 21.4*  HGB 8.0* 8.1* 8.0* 7.7* 8.5*  HCT 24.4* 25.5* 25.6* 25.3* 28.2*  MCV 90.7 94.4 96.6 97.7 98.9  PLT 128* 149* 146* 123* 130*    Cardiac Enzymes: No results for input(s): CKTOTAL, CKMB, CKMBINDEX, TROPONINI in the last 168 hours.  BNP: Invalid input(s): POCBNP  CBG: Recent Labs  Lab 12/30/19 1124 12/30/19 1612 12/30/19 1950 12/31/19 0010 12/31/19 0328  GLUCAP 209* 167* 169* 171* 132*    Microbiology: Results for orders placed or performed during the hospital encounter of 12/15/2019  MRSA PCR Screening     Status: None   Collection Time: 12/01/2019 11:00 AM   Specimen: Nasopharyngeal  Result Value Ref Range Status   MRSA by PCR NEGATIVE NEGATIVE Final    Comment:        The GeneXpert MRSA Assay (FDA approved for NASAL specimens only), is one component of a comprehensive MRSA colonization surveillance program. It is not intended to diagnose MRSA infection nor to guide or monitor treatment for MRSA infections. Performed at [facility], [place] [address] [place], [place] [address]   Culture, bal-quantitative     Status: Abnormal   Collection Time: 12/08/2019 11:37 AM   Specimen: Bronchoalveolar Lavage  Result Value Ref Range Status   Specimen Description BRONCHIAL ALVEOLAR LAVAGE  Final   Special Requests NONE  Final   Gram Stain   Final    FEW WBC PRESENT,BOTH PMN AND MONONUCLEAR NO ORGANISMS SEEN    Culture (A)  Final    1,000 COLONIES/mL Consistent with normal respiratory flora. Performed at [facility], [place] [address], [place], [place] [address]    Report Status 12/16/2019 FINAL  Final  Culture, blood  (  routine x 2)     Status: None   Collection Time: 01/03/2020  6:11 AM   Specimen: BLOOD  Result Value Ref Range Status   Specimen Description BLOOD RIGHT HAND  Final   Special Requests   Final    BOTTLES DRAWN AEROBIC ONLY Blood Culture results may not be optimal due to an inadequate volume of blood received in culture bottles   Culture   Final    NO GROWTH 5 DAYS Performed at [facility], [place] [address], [place], [place] [address]    Report Status 12/26/2019 FINAL  Final  Culture, blood (routine x 2)     Status: None   Collection Time: 01/07/2020  6:16 AM   Specimen: BLOOD  Result Value Ref Range Status   Specimen Description BLOOD RIGHT ARM  Final   Special Requests   Final    BOTTLES DRAWN AEROBIC ONLY Blood Culture results may not be optimal due to an inadequate volume of blood received in culture bottles   Culture   Final    NO GROWTH 5 DAYS Performed at [facility], [place] [address] [place], [place] [address]    Report Status 12/26/2019 FINAL  Final  Body fluid culture     Status: None   Collection Time: 12/26/2019  3:06 PM   Specimen: PATH Cytology Pleural fluid; Body Fluid  Result Value Ref Range Status   Specimen Description PLEURAL FLUID  Final   Special Requests LEFT A  Final   Gram Stain   Final    MODERATE WBC PRESENT, PREDOMINANTLY PMN NO ORGANISMS SEEN    Culture   Final    NO GROWTH 3 DAYS Performed at [facility], [address], [place], [place] [address]    Report Status 12/25/2019 FINAL  Final  Aerobic/Anaerobic Culture (surgical/deep wound)     Status: None   Collection Time: 12/19/2019  3:06 PM   Specimen: PATH Cytology Pleural fluid; Body Fluid  Result Value Ref Range Status   Specimen Description PLEURAL  Final   Special Requests FLUID LEFT  Final   Gram Stain   Final    RARE WBC PRESENT, PREDOMINANTLY PMN NO ORGANISMS SEEN    Culture   Final    No growth aerobically or anaerobically. Performed at [facility], [place]  [address] [place], [place] [address]    Report Status 12/26/2019 FINAL  Final  Culture, respiratory (non-expectorated)     Status: None   Collection Time: 12/22/19  8:27 AM   Specimen: Tracheal Aspirate; Respiratory  Result Value Ref Range Status   Specimen Description TRACHEAL ASPIRATE  Final   Special Requests Normal  Final   Gram Stain   Final    RARE WBC PRESENT, PREDOMINANTLY PMN NO ORGANISMS SEEN Performed at [facility], [place] [address], [place], [place] [address]    Culture RARE CANDIDA ALBICANS  Final   Report Status 12/25/2019 FINAL  Final    Coagulation Studies: No results for input(s): LABPROT, INR in the last 72 hours.  Urinalysis: No results for input(s): COLORURINE, LABSPEC, PHURINE, GLUCOSEU, HGBUR, BILIRUBINUR, KETONESUR, PROTEINUR, UROBILINOGEN, NITRITE, LEUKOCYTESUR in the last 72 hours.  Invalid input(s): APPERANCEUR    Imaging: DG Chest Port 1 View  Result Date: 12/30/2019 CLINICAL DATA:  Hypoxia EXAM: PORTABLE CHEST 1 VIEW COMPARISON:  December 29, 2019 FINDINGS: Endotracheal tube tip is 4.3 cm above the carina. Feeding tube tip is below the diaphragm. There are chest tubes  on the left. Left jugular catheter tip is in the superior vena cava. Left subclavian catheter tip is in the superior vena cava. No pneumothorax. There is a pleural effusion on each side. There is consolidation in portions of the left mid and lower lung zones. No new opacity evident. There is cardiomegaly with pulmonary vascularity within normal limits. No adenopathy. No bone lesions. IMPRESSION: Tube and catheter positions as described. No pneumothorax. Areas of airspace opacity in the left mid and lower lung zone regions, stable. Pleural effusions bilaterally. Stable cardiac prominence. Electronically Signed   By: Lowella Grip III M.D.   On: 12/30/2019 07:55     Medications:   .  prismasol BGK 4/2.5 500 mL/hr at 12/30/19 1102  .  prismasol BGK 4/2.5 300 mL/hr at 12/30/19 0448  . sodium  chloride Stopped (12/30/19 2139)  . amiodarone 30 mg/hr (12/31/19 0600)  . dexmedetomidine (PRECEDEX) IV infusion 0.6 mcg/kg/hr (12/31/19 0600)  . feeding supplement (VITAL AF 1.2 CAL) 60 mL/hr at 12/30/19 1900  . fentaNYL infusion INTRAVENOUS 125 mcg/hr (12/31/19 0600)  . levETIRAcetam Stopped (12/30/19 2154)  . norepinephrine (LEVOPHED) Adult infusion Stopped (12/28/19 0817)  . prismasol BGK 4/2.5 1,500 mL/hr at 12/30/19 1640   . amantadine  100 mg Per Tube BID  . aspirin  325 mg Per Tube Daily  . atorvastatin  40 mg Per Tube Daily  . B-complex with vitamin C  1 tablet Per Tube Daily  . chlorhexidine gluconate (MEDLINE KIT)  15 mL Mouth Rinse BID  . Chlorhexidine Gluconate Cloth  6 each Topical Q0600  . docusate  100 mg Per Tube BID  . feeding supplement (PRO-STAT SUGAR FREE 64)  30 mL Per Tube Daily  . insulin aspart  0-20 Units Subcutaneous Q4H  . insulin aspart  6 Units Subcutaneous Q4H  . insulin glargine  10 Units Subcutaneous BID  . mouth rinse  15 mL Mouth Rinse 10 times per day  . pantoprazole (PROTONIX) IV  40 mg Intravenous Q24H  . phenytoin (DILANTIN) IV  100 mg Intravenous Q8H  . polyethylene glycol  17 g Per Tube Daily  . senna-docusate  1 tablet Per Tube BID  . sodium chloride flush  10-40 mL Intracatheter Q12H   sodium chloride, acetaminophen, bisacodyl, fentaNYL, hydrALAZINE, midazolam, ondansetron (ZOFRAN) IV, oxyCODONE, senna-docusate, sodium chloride flush  Assessment/ Plan:   Acute kidney injury, oliguric ischemic ATN secondary to hemorrhagic shock requiring pressor support.  Refractory to IV diuretics initiated on CRRT 12/26/2019 left IJ temporary dialysis catheter per CCM.  Negative fluid balance on CRRT 100-150 cc an hour.  ANEMIA-transfusion 5 units packed red blood cells 9 units fresh frozen plasma 2 units cryoprecipitate.  Last transfusion 01/11/2020  Chronic atrial fibrillation status post pacemaker anticoagulation on hold.  Rate controlled with  amiodarone  Hemorrhagic shock secondary to hemothorax status post multiple transfusions 01/06/2020.    Metabolic acidosis appears to be controlled with CRRT  Hemorrhagic right MCA infarction seizure having received thrombectomy complicated by brain edema and craniectomy 10/31/2019  Diabetes mellitus insulin sliding scale  Seizure prophylaxis continues with Dilantin  Hypophosphatemia.  We will replete with 10 mEq sodium phosphate     LOS: [place] _0 _1 :40 AM

## 2019-12-31 NOTE — Progress Notes (Signed)
NAME:  Krystal Little, MRN:  [id number], DOB:  Aug 16, 1955, LOS: 25 ADMISSION DATE:  12/01/2019, CONSULTATION DATE:  5/29 REFERRING MD:  Letta Pate, CHIEF COMPLAINT:  Chest congestion   Brief History   82 female with an extensive cardiac history was admitted to [facility] in April in the setting of a right MCA stroke, had a right interventional radiology guided thrombectomy, later had significant brain edema and underwent craniectomy, ultimately transferred to inpatient rehab.  Pulmonary and critical care medicine was consulted on May 29 in the setting of tachypnea and an enlarging left-sided pleural effusion. Thoracentesis was performed which showed a hemothorax.  A CT chest was then ordered showing obstruction of her left mainstem bronchus and a loculated left pleural effusion.  On May 30 she was moved to the ICU for chest tube placement and bronchoscopy.   Past Medical History  Status post mitral valve repair 2001 Status post cardiac pacemaker 2001 History of sick sinus syndrome Hypertension Diabetes mellitus type 2 Chronic atrial fibrillation Right MCA stroke 2021  Significant Hospital Events   4/16 IR thrombectomy R MCA stroke 4/25 PCCM sign off 5/6 transfer to [facility] 5/29 PCCM consult left pleural effusion, thoracentesis 5/30 worsening tachypnea. Move to ICU for intubation to facilitate bronchoscopy and chest tube placement 5/31 TPA/ pulmonzyme started for 3 days for persistent left effusion 6/01 Afebrile, low-dose Levophed, Precedex, Minimal output on chest tube- pulmozyme / tpa x 2,  6/02 Weaned x 7 hours yesterday PSV 15/5, pulmozyme and TPA completed 3 days 6/03 afib with RVR placed on amio gtt overnight , 1U PRBC for Hb 7.1  6/05 extubated successfully, Repeat TPA/Pulmonzyme given again  6/06 acute decompensation overnight, hemorrhagic shock > to OR for left VATS with 2L hemothorax drained.  CT head with new ICH> neurosurgery consulted 6/09 More awake but  agitated on PSV weaning  6/12: Seizures + on EEG   Consults:  PCCM  Procedures:  5/29 Thoracentesis >> 1.5 L bloody fluid 5/30 ETT >  5/30 L pigtail chest tube >  Bronchoscopy 5/30 >>  large, thick, grey mucus plug completely occluding the left mainstem bronchus.  [facility] HD cath 6/12 >  Significant Diagnostic Tests:  Thoracentesis left chest 5/29 >> 8K WBC, 78% WBC, protein 4.0, LDH 182 CT chest abdomen pelvis 6/6 >> large left hemithorax, patchy right lung opacities with small right effusion.  No acute abdominal process CT head 6/6 >> evolution of right MCA infarct with hemorrhagic transformation with large intraparenchymal hematoma, 2 additional small hemorrhages in the inferior cerebellum and left temporal lobe.  Trace subarachnoid blood. CT Head 6/10 >> stable hemorrhagic R MCA.  Right hemi-craniectomy with no midline shift or mass effect.  Mildly regressed small cerebellar hemorrhages.  Stable small volume intraventricular hemorrhage, no ventriculomegaly.  Trace subarachnoid hemorrhage largely resolved.  6/12 EEG: right frontocentral seizures X2   Micro Data:   RVP 4/16 neg  MRSA PCR  4/17 neg   ======== 5/29 Left pleural fluid culture > negative 5/29 left pleural fluid cytology >> No malignant cells identified. Blood and acute inflammation.  5/30 BAL >> rare candida albicans 6/06 L Pleural Fluid >> negative 6/06 BCx2 >> negative   Antimicrobials:  Cefepime 4/24 >> 4/25 Zosyn 4/28 >> 5/4 Zosyn 5/30 >> 6/5 Cefepime 6/6 >> 6/12  Interim history/subjective:   Remains critically ill on mv. Sedate on cvvhd. No issues overnight   Objective   Blood pressure (!) 129/50, pulse 65, temperature 98.1 F (36.7 C), resp.  rate (!) 31, height _0  (1.626 m), weight 73.2 kg, SpO2 94 %.    Vent Mode: PRVC FiO2 (%):  [30 %-50 %] 30 % Set Rate:  [20 bmp] 20 bmp Vt Set:  [430 mL] 430 mL PEEP:  [5 cmH20] 5 cmH20 Pressure Support:  [15 cmH20] 15 cmH20 Plateau Pressure:  [28 cmH20] 28  cmH20   Intake/Output Summary (Last 24 hours) at 12/31/2019 1038 Last data filed at 12/31/2019 1000 Gross per 24 hour  Intake 3047.07 ml  Output 4681 ml  Net -1633.93 ml   Filed Weights   12/29/19 0500 12/30/19 0400 12/31/19 0335  Weight: 88.9 kg 81.3 kg 73.2 kg    Examination:  General: elderly FM, comfortable, chronically ill appearing  HEENT: s/p right hemicrani  Neuro: sedated, moves extremities  CV: RRR, s1 s2 PULM: bl vented breaths  GI: soft, nt nd  Extremities: third spaced edema   Assessment & Plan:   Status Epilepticus, likely related to underlying stroke history :EEG off 6/14 - seizures resolved  - on AEDs with prn ativan  Acute Respiratory Failure with Hypoxemia Mucus Plugging, Suspected Aspiration   Left Pleural Effusion s/p Thoracentesis, L Chest Tube  - remains on adult mech vent protocol  - I suspect will need prolonged support - would consider trach to be complete by end of week if unable to wean  - CT per TCTS   AKI secondary to shock - no evidence of obstruction on renal US. Hypernatremia. -cvvhd per nephro  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 - s/p right hemicraniotomy, complicated by ICH after intrapleural tPA.  Evaluted by neurosurgery who felt that CTs were stable and no surgical evacuation or EVD was warranted. Neurosurgery with no plans for intervention at this time - protection of the right hemicrani site  - neuro checks  - off ltveeg, seizures resolved on AEDs  - prn ativan  DM2 with Hyperglycemia -- CBG, SSI, Lantus  - CBG in 90s, decreasing lantus   Chronic Atrial Fibrillation s/p Pacemaker -tele - eliquis held d/t bleeding   Moderate Protein Calorie Malnutrition  -tube feeds   Goals of Care P: -full code   Resolved: - Hemorrhagic shock secondary to hemothorax  Best practice:  Diet: TF  Pain/Anxiety/Delirium protocol (if indicated): Off all continuous sedation at this time VAP protocol (if indicated):  yes DVT prophylaxis: SCDs GI prophylaxis: PPI Glucose control: SSI Mobility: BR Code Status: full code Family Communication: Updated husband yesterday evening Disposition:  ICU  This patient is critically ill with multiple organ system failure; which, requires frequent high complexity decision making, assessment, support, evaluation, and titration of therapies. This was completed through the application of advanced monitoring technologies and extensive interpretation of multiple databases. During this encounter critical care time was devoted to patient care services described in this note for 32 minutes.  Garner Nash, DO [facility] 12/31/2019 10:38 AM

## 2019-12-31 NOTE — Plan of Care (Addendum)
Reexamined patient around 1430. Husband and palliative care NP at bedside along with RN. Informed patient's husband [name] that she is still comatose,not opening eyes, corneal reflex on right brisk, subtle corneal reflex on left, difficult to elicit gag reflex, doesn't withdraw to noxious stimuli except in RLE 1/5. Husband states she was moving extremities for her daughter after I examined her earlier this am. Husband states he wants the examiner to have "positive energy", he was told twice by doctors in past that he wont survive and he survived, he lost his son at age 89, his wife has been in hospital for a while and he know she will improve and her "situation" can be remedied.   Difficult to have any further conversation with husband about neurological status. We did agree that her worsening exam might be secondary to sedation. However, if tomorrow patient continues to be comatose, we will consider CT head and EEG to look for any acute abnormality, seizures.   Appreciate palliative care teams help.   Caileb Rhue Barbra Sarks

## 2019-12-31 NOTE — Progress Notes (Signed)
Subjective: No acute events overnight.  Per RN, patient followed commands like squeezing hands overnight and this morning.  Patient's daughter at bedside.  ROS: Unable to obtain due to poor mental status  Examination  Vital signs in last 24 hours: Temp:  [96.6 F (35.9 C)-98.8 F (37.1 C)] 98.1 F (36.7 C) (06/16 1030) Pulse Rate:  [60-78] 65 (06/16 1030) Resp:  [17-38] 31 (06/16 1030) BP: (118-194)/(43-81) 129/50 (06/16 1030) SpO2:  [91 %-100 %] 94 % (06/16 1030) FiO2 (%):  [30 %-50 %] 30 % (06/16 0800) Weight:  [73.2 kg] 73.2 kg (06/16 0335)  General: lying in bed,not in apparent distress CVS: pulse-normal rate and rhythm RS: breathing comfortably,intubated Extremities: normal,warm  Neuro: EU:MPNTI eyes spontaneously, does not follow commands CN: pupils equal and reactive,no nystagmus, no forced gaze deviation, +cough reflex, rest CN difficult to assess due to intubation Motor:Withdraws to noxious stimuli in right lower extremity with 1/5 strength, does not withdraw to noxious stimuli in all other extremities   Basic Metabolic Panel: Recent Labs  Lab 12/27/19 0357 12/27/19 1531 12/28/19 0320 12/28/19 1503 12/29/19 0353 12/29/19 0353 12/29/19 1508 12/29/19 1508 12/30/19 0445 12/30/19 1617 12/31/19 0347  NA 134*   < > 134*   < > 133*  --  134*  --  134* 134* 134*  K 3.9   < > 4.0   < > 4.1  --  4.3  --  4.2 4.5 4.9  CL 102   < > 99   < > 100  --  101  --  100 101 102  CO2 21*   < > 22   < > 25  --  24  --  25 25 24   GLUCOSE 197*   < > 154*   < > 233*  --  206*  --  167* 185* 151*  BUN 70*   < > 37*   < > 20  --  17  --  14 15 15   CREATININE 2.91*   < > 1.94*   < > 1.44*  --  1.29*  --  1.21* 1.26* 1.15*  CALCIUM 7.8*   < > 7.6*   < > 7.9*   < > 7.8*   < > 7.9* 7.8* 7.8*  MG 2.3  --  2.2  --  2.3  --   --   --  2.3  --  2.4  PHOS 5.2*   < > 3.4   < > 2.3*  --  1.9*  --  1.8* 2.2* 2.3*   < > = values in this interval not displayed.    CBC: Recent Labs   Lab 12/27/19 0357 12/28/19 0711 12/29/19 0353 12/30/19 0445 12/31/19 0347  WBC 18.3* 18.4* 15.9* 15.9* 21.4*  HGB 8.0* 8.1* 8.0* 7.7* 8.5*  HCT 24.4* 25.5* 25.6* 25.3* 28.2*  MCV 90.7 94.4 96.6 97.7 98.9  PLT 128* 149* 146* 123* 130*     Coagulation Studies: No results for input(s): LABPROT, INR in the last 72 hours.  Imaging No new brain imaging overnight  ASSESSMENT AND PLAN:Krystal Little is a 64 year old female with recent right MCA hemorrhagic infarct status post hemicraniectomy was then noted to have seizures and is currently on Keppra and fosphenytoin.  Right hemorrhagic MCA infarct status post hemicraniectomy Intraventricular hemorrhage Seizures post stroke - Patient was waking up more but not following commands for me on my exam yesterday afternoon.  However today she is opening her eyes, still not following commands and also not  moving her extremities as well as she was yesterday.  This could be secondary to medications (currently on Precedex and fentanyl) versus less likely seizures or worsening of cerebral bleed and edema  Recommendations -Continuecurrent AEDs:Keppra750mg  twice daily, Dilantin 100 mg every 8 hours ( CorrectedDilantin level on 12/29/2019 19.4) -I will reexamine patient this afternoon once sedation is held.  If patient continues to be more drowsy and not moving all her extremities, will repeat CT head without contrast as well as EEG. -Discussed plan in detail with patient's daughter at bedside as well as patient's RN. -Continue seizure precautions -Continue to minimize sedation per critical care team -As needed IV Ativan 2 mg for clinical seizure-like activity -We will defer management of rest of comorbidities to primary team  CRITICAL CARE Performed by: Lora Havens   Total critical care time: 35 minutes  Critical care time was exclusive of separately billable procedures and treating other patients.  Critical care was necessary to  treat or prevent imminent or life-threatening deterioration.  Critical care was time spent personally by me on the following activities: development of treatment plan with patient and/or surrogate as well as nursing, discussions with consultants, evaluation of patient's response to treatment, examination of patient, obtaining history from patient or surrogate, ordering and performing treatments and interventions, ordering and review of laboratory studies, ordering and review of radiographic studies, pulse oximetry and re-evaluation of patient's condition.

## 2020-01-01 ENCOUNTER — Other Ambulatory Visit: Payer: Self-pay | Admitting: Cardiology

## 2020-01-01 DIAGNOSIS — Z978 Presence of other specified devices: Secondary | ICD-10-CM

## 2020-01-01 DIAGNOSIS — A419 Sepsis, unspecified organism: Secondary | ICD-10-CM

## 2020-01-01 DIAGNOSIS — Z515 Encounter for palliative care: Secondary | ICD-10-CM

## 2020-01-01 DIAGNOSIS — J9690 Respiratory failure, unspecified, unspecified whether with hypoxia or hypercapnia: Secondary | ICD-10-CM

## 2020-01-01 DIAGNOSIS — J942 Hemothorax: Secondary | ICD-10-CM

## 2020-01-01 DIAGNOSIS — J690 Pneumonitis due to inhalation of food and vomit: Secondary | ICD-10-CM

## 2020-01-01 DIAGNOSIS — Z7189 Other specified counseling: Secondary | ICD-10-CM

## 2020-01-01 DIAGNOSIS — Z789 Other specified health status: Secondary | ICD-10-CM

## 2020-01-01 DIAGNOSIS — J90 Pleural effusion, not elsewhere classified: Secondary | ICD-10-CM

## 2020-01-01 DIAGNOSIS — R569 Unspecified convulsions: Secondary | ICD-10-CM

## 2020-01-01 LAB — RENAL FUNCTION PANEL
Albumin: 1.8 g/dL — ABNORMAL LOW (ref 3.5–5.0)
Albumin: 1.7 g/dL — ABNORMAL LOW (ref 3.5–5.0)
Anion gap: 10 (ref 5–15)
Anion gap: 9 (ref 5–15)
BUN: 19 mg/dL (ref 8–23)
BUN: 20 mg/dL (ref 8–23)
CO2: 24 mmol/L (ref 22–32)
CO2: 25 mmol/L (ref 22–32)
Calcium: 7.8 mg/dL — ABNORMAL LOW (ref 8.9–10.3)
Calcium: 7.9 mg/dL — ABNORMAL LOW (ref 8.9–10.3)
Chloride: 99 mmol/L (ref 98–111)
Chloride: 100 mmol/L (ref 98–111)
Creatinine, Ser: 1.18 mg/dL — ABNORMAL HIGH (ref 0.44–1.00)
Creatinine, Ser: 1.14 mg/dL — ABNORMAL HIGH (ref 0.44–1.00)
GFR calc Af Amer: 56 mL/min — ABNORMAL LOW (ref >60)
GFR calc Af Amer: 58 mL/min — ABNORMAL LOW (ref >60)
GFR calc non Af Amer: 48 mL/min — ABNORMAL LOW (ref >60)
GFR calc non Af Amer: 50 mL/min — ABNORMAL LOW (ref >60)
Glucose, Bld: 210 mg/dL — ABNORMAL HIGH (ref 70–99)
Glucose, Bld: 205 mg/dL — ABNORMAL HIGH (ref 70–99)
Phosphorus: 2.9 mg/dL (ref 2.5–4.6)
Phosphorus: 2.4 mg/dL — ABNORMAL LOW (ref 2.5–4.6)
Potassium: 5.1 mmol/L (ref 3.5–5.1)
Potassium: 4.8 mmol/L (ref 3.5–5.1)
Sodium: 133 mmol/L — ABNORMAL LOW (ref 135–145)
Sodium: 134 mmol/L — ABNORMAL LOW (ref 135–145)

## 2020-01-01 LAB — CBC
HCT: 27 % — ABNORMAL LOW (ref 36.0–46.0)
Hemoglobin: 8.2 g/dL — ABNORMAL LOW (ref 12.0–15.0)
MCH: 30.5 pg (ref 26.0–34.0)
MCHC: 30.4 g/dL (ref 30.0–36.0)
MCV: 100.4 fL — ABNORMAL HIGH (ref 80.0–100.0)
Platelets: 121 10*3/uL — ABNORMAL LOW (ref 150–400)
RBC: 2.69 MIL/uL — ABNORMAL LOW (ref 3.87–5.11)
RDW: 22.7 % — ABNORMAL HIGH (ref 11.5–15.5)
WBC: 24.7 10*3/uL — ABNORMAL HIGH (ref 4.0–10.5)
nRBC: 0.3 % — ABNORMAL HIGH (ref 0.0–0.2)

## 2020-01-01 LAB — GLUCOSE, CAPILLARY
Glucose-Capillary: 166 mg/dL — ABNORMAL HIGH (ref 70–99)
Glucose-Capillary: 169 mg/dL — ABNORMAL HIGH (ref 70–99)
Glucose-Capillary: 175 mg/dL — ABNORMAL HIGH (ref 70–99)
Glucose-Capillary: 194 mg/dL — ABNORMAL HIGH (ref 70–99)
Glucose-Capillary: 215 mg/dL — ABNORMAL HIGH (ref 70–99)
Glucose-Capillary: 222 mg/dL — ABNORMAL HIGH (ref 70–99)

## 2020-01-01 LAB — MAGNESIUM: Magnesium: 2.6 mg/dL — ABNORMAL HIGH (ref 1.7–2.4)

## 2020-01-01 MED ORDER — HEPARIN SODIUM (PORCINE) 1000 UNIT/ML DIALYSIS
1000.0000 [IU] | INTRAMUSCULAR | Status: DC | PRN
  Administered 2020-01-04: 3000 [IU] via INTRAVENOUS_CENTRAL
  Filled 2020-01-01 (×2): qty 6

## 2020-01-01 NOTE — Progress Notes (Signed)
Daily Progress Note   Patient Name: Krystal Little       Date: 01/01/2020 DOB: April 16, 1956  Age: 64 y.o. MRN#: [id number] Attending Physician: Krystal Nash, DO Primary Care Physician: Krystal Klein, MD Admit Date: 11/15/2019  Reason for Consultation/Follow-up: Establishing goals of care  Subjective: Met with spouse again today for extended amount of time. Broached goals of care more in depth. I discussed that while I hoped for and did not deny miracles- it is necessary to let him know of other outcomes and to discuss what the goals of care would be in the context of what Krystal Little's wishes would be.  Had frank discussion regarding possible outcomes including long term or permanent dependence on ventilator, long term or permanent feeding tube and long term or permanent feeding tube.  We discussed what placement in specialized facility would look like with repeated return hospitalizations and eventual demise. Code status was discussed. I discussed that trach placement is likely to happen tomorrow per PCCM and CVTS notes.  Krystal Little states he is uncertain about trach placement and wishes to discuss this with medical providers.  Krystal Little again was very focused on what "caused the mucous" "on the fourth floor". After further discussion- I believe he was trying to elicit what was the cause of Krystal Little's aspiration. He asked if it was the Krystal Little she was receiving. I discussed with him there was no true way to have an absolute answer to what she actually aspirated- it is possible that it was ongoing aspiration of secretions, it could have been tube feeding through cortrak, it could have been food, and it could have been her Krystal Little or other unknown etiology. Code status was discussed- Krystal Little wishes for  Krystal Little to remain full code.    Review of Systems  Unable to perform ROS: Intubated    Length of Stay: 18  Current Medications: Scheduled Meds:  . amantadine  100 mg Per Tube BID  . aspirin  325 mg Per Tube Daily  . atorvastatin  40 mg Per Tube Daily  . B-complex with vitamin C  1 tablet Per Tube Daily  . chlorhexidine gluconate (MEDLINE KIT)  15 mL Mouth Rinse BID  . Chlorhexidine Gluconate Cloth  6 each Topical Q0600  . docusate  100 mg Per Tube BID  . feeding supplement (PRO-STAT  SUGAR FREE 64)  30 mL Per Tube Daily  . insulin aspart  0-20 Units Subcutaneous Q4H  . insulin glargine  5 Units Subcutaneous BID  . mouth rinse  15 mL Mouth Rinse 10 times per day  . pantoprazole (PROTONIX) IV  40 mg Intravenous Q24H  . phenytoin (DILANTIN) IV  100 mg Intravenous Q8H  . polyethylene glycol  17 g Per Tube TID  . senna-docusate  1 tablet Per Tube BID  . sodium chloride flush  10-40 mL Intracatheter Q12H    Continuous Infusions: .  prismasol BGK 4/2.5 500 mL/hr at 01/01/20 1015  .  prismasol BGK 4/2.5 300 mL/hr at 01/01/20 0928  . sodium chloride Stopped (12/30/19 2139)  . amiodarone 30 mg/hr (01/01/20 1400)  . dexmedetomidine (PRECEDEX) IV infusion Stopped (12/31/19 1428)  . feeding supplement (VITAL AF 1.2 CAL) 60 mL/hr at 01/01/20 0700  . fentaNYL infusion INTRAVENOUS Stopped (12/31/19 1440)  . levETIRAcetam Stopped (01/01/20 0926)  . norepinephrine (LEVOPHED) Adult infusion Stopped (12/28/19 0817)  . prismasol BGK 4/2.5 1,500 mL/hr at 01/01/20 1247    PRN Meds: sodium chloride, acetaminophen, bisacodyl, fentaNYL, heparin, hydrALAZINE, midazolam, ondansetron (ZOFRAN) IV, oxyCODONE, senna-docusate, sodium chloride flush  Physical Exam Vitals and nursing note reviewed.  Constitutional:      Appearance: She is ill-appearing and diaphoretic.     Comments: awake  Neurological:     Comments: Responds to noxious stimuli, R upper extremity with nonpurposeful movement, turns head  a little, R gaze preference does not track             Vital Signs: BP (!) 128/51   Pulse 64   Temp (!) 97.3 F (36.3 C)   Resp (!) 36   Ht '5\' 4"'  (1.626 m)   Wt 73.8 kg   SpO2 100%   BMI 27.93 kg/m  SpO2: SpO2: 100 % O2 Device: O2 Device: Ventilator O2 Flow Rate: O2 Flow Rate (L/min): 4 L/min  Intake/output summary:   Intake/Output Summary (Last 24 hours) at 01/01/2020 1433 Last data filed at 01/01/2020 1400 Gross per 24 hour  Intake 2300.05 ml  Output 4716 ml  Net -2415.95 ml   LBM: Last BM Date: (P) 12/30/19 Baseline Weight: Weight: 61.3 kg Most recent weight: Weight: 73.8 kg       Palliative Assessment/Data: PPS: 10%      Patient Active Problem List   Diagnosis Date Noted  . Seizures ([place])   . Hemothorax   . Aspiration pneumonia ([place])   . Septic shock ([place])   . Goals of care, counseling/discussion   . Advanced care planning/counseling discussion   . Poor prognosis   . Palliative care by specialist   . Endotracheally intubated   . Hx of CABG   . AKI (acute kidney injury) ([place])   . Hemorrhagic shock ([place])   . Pleural effusion 12/04/2019  . Labile blood pressure   . Essential hypertension   . Benign essential HTN   . Labile blood glucose   . Status post craniectomy   . Dysphagia, post-stroke   . Atrial fibrillation ([place])   . Left hemiparesis ([place])   . Cerebral edema ([place]) 11/20/2019  . ICH (intracerebral hemorrhage), SAH ([place]) result of ischemic stroke s/p crani 11/20/2019  . Dysphagia due to recent cerebral infarction 11/20/2019  . Acute blood loss anemia 11/20/2019  . 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 History of ETT   . Hypokalemia   . Acute respiratory failure ([place]) 11/02/2019  . Cerebrovascular  accident (CVA) due to embolism of right middle cerebral artery ([place]) 10/31/2019  . Chronic venous insufficiency 10/07/2019  . Wound of RLE  08/18/2019  . Hyperlipidemia associated with type 2 diabetes mellitus ([place]) 09/25/2016  .  Diastolic dysfunction without heart failure 06/02/2015  . Health care maintenance 03/24/2013  . Hypertension associated with diabetes ([place]) 10/16/2011  . Long term current use of anticoagulant therapy 08/27/2010  . DM (diabetes mellitus), type 2, uncontrolled ([place]) 02/11/2008  . MITRAL REGURGITATION - status post MVR 09/14/1999  . S/P MVR (mitral valve repair) 08/09/1999  . Atrial fibrillation with RVR ([place]) 08/03/1999  . S/P placement of cardiac pacemaker 07/18/1999    Palliative Care Assessment & Plan   Patient Profile: 64 y.o. 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 Patient had some progression in rehab- was able to sit up in wheelchair, using hemiwalker for limited mobility, dunking basketballs, speech was improving however significant cognitive impairments remained- notably her ability to take in oral nutrition was impaired by prolonged mastication and persistent oral residue, however no overt signs of aspiration. She developed SOB on 5/28 and was found to have pleural effusion on chest xray. PCCM conducted thoracentesis on 5/29 that yielded 1553m of bloody fluid. Shortness of breath persisted and CT scan showed obstruction of her L mainstem bronchus (possibly due to aspiration) with loculated pleural effusion and [facility].  She developed septic (r/t to aspiration pneumonia and hemothorax) shock and had worsening renal function and hypotension requiring  levophed. Extubated on 6/4. 6/6 had episode of severe hypotension, altered mental status, reintubated, restarted on pressors- had large volume output of blood via chestube resulting in hemorrhagic shock. 6/6 had VATS and bronchoscopy to drain hemothorax and clear secretions. 6/6 CT scan head positive for new hemorrhages- no role for neurosurgery. CVVHD started on 6/11 due to progressive worsening renal function. 6/12 EEG noted seizures. She remains virtually unresponsive- spouse and RN did report some purposeful upper extremity movements 6/15. Palliative medicine consulted for assistance with goals of care.     Assessment/Recommendations/Plan   Continue full scope care  Spouse was more receptive to hearing outcomes other than full recovery- discussed that medical teams responsibility is to prepare for all possible outcomes- not just envision the positives- however, MOlga Millersremains hopeful for full recovery  Recommend frank discussion with MOlga Millersregarding likely full functional recovery and what the goal of tracheostomy placement would be  PMT will continue to follow  Goals of Care and Additional Recommendations:  Limitations on Scope of Treatment: Full Scope Treatment  Code Status:  Full code  Prognosis:   Unable to determine  Discharge Planning:  To Be Determined  Care plan was discussed with patient's spouse.  Thank you for allowing the Palliative Medicine Team to assist in the care of this patient.   Time In: 1400 Time Out: 1500 Total Time 60 mins Prolonged Time Billed yes      Greater than 50%  of this time was spent counseling and coordinating care related to the above assessment and plan.  Mariana Kaufman, AGNP-C Palliative Medicine   Please contact Palliative Medicine Team phone at [phone] for questions and concerns.

## 2020-01-01 NOTE — Progress Notes (Signed)
Called [employer] rep Tomi Bamberger to assess patient's permanent pacemaker, which no longer seems to be pacing appropriately. Pacemaker appears to be firing erratically, though normal ventricular paced beats are observed at times.  Patient is stable at this time with HR in the 60s. [employer] rep coming to bedside to evaluate. Dr Valeta Harms notified.

## 2020-01-01 NOTE — Progress Notes (Signed)
NAME:  Krystal Little, MRN:  [id number], DOB:  June 13, 1956, LOS: 53 ADMISSION DATE:  12/06/2019, CONSULTATION DATE:  5/29 REFERRING MD:  Letta Pate, CHIEF COMPLAINT:  Chest congestion   Brief History   52 female with an extensive cardiac history was admitted to [facility] in April in the setting of a right MCA stroke, had a right interventional radiology guided thrombectomy, later had significant brain edema and underwent craniectomy, ultimately transferred to inpatient rehab.  Pulmonary and critical care medicine was consulted on May 29 in the setting of tachypnea and an enlarging left-sided pleural effusion. Thoracentesis was performed which showed a hemothorax.  A CT chest was then ordered showing obstruction of her left mainstem bronchus and a loculated left pleural effusion.  On May 30 she was moved to the ICU for chest tube placement and bronchoscopy.   Past Medical History  Status post mitral valve repair 2001 Status post cardiac pacemaker 2001 History of sick sinus syndrome Hypertension Diabetes mellitus type 2 Chronic atrial fibrillation Right MCA stroke 2021  Significant Hospital Events   4/16 IR thrombectomy R MCA stroke 4/25 PCCM sign off 5/6 transfer to [facility] 5/29 PCCM consult left pleural effusion, thoracentesis 5/30 worsening tachypnea. Move to ICU for intubation to facilitate bronchoscopy and chest tube placement 5/31 TPA/ pulmonzyme started for 3 days for persistent left effusion 6/01 Afebrile, low-dose Levophed, Precedex, Minimal output on chest tube- pulmozyme / tpa x 2,  6/02 Weaned x 7 hours yesterday PSV 15/5, pulmozyme and TPA completed 3 days 6/03 afib with RVR placed on amio gtt overnight , 1U PRBC for Hb 7.1  6/05 extubated successfully, Repeat TPA/Pulmonzyme given again  6/06 acute decompensation overnight, hemorrhagic shock > to OR for left VATS with 2L hemothorax drained.  CT head with new ICH> neurosurgery consulted 6/09 More awake but  agitated on PSV weaning  6/12: Seizures + on EEG   Consults:  PCCM  Procedures:  5/29 Thoracentesis >> 1.5 L bloody fluid 5/30 ETT >  5/30 L pigtail chest tube >  Bronchoscopy 5/30 >>  large, thick, grey mucus plug completely occluding the left mainstem bronchus.  [facility] HD cath 6/12 >  Significant Diagnostic Tests:  Thoracentesis left chest 5/29 >> 8K WBC, 78% WBC, protein 4.0, LDH 182 CT chest abdomen pelvis 6/6 >> large left hemithorax, patchy right lung opacities with small right effusion.  No acute abdominal process CT head 6/6 >> evolution of right MCA infarct with hemorrhagic transformation with large intraparenchymal hematoma, 2 additional small hemorrhages in the inferior cerebellum and left temporal lobe.  Trace subarachnoid blood. CT Head 6/10 >> stable hemorrhagic R MCA.  Right hemi-craniectomy with no midline shift or mass effect.  Mildly regressed small cerebellar hemorrhages.  Stable small volume intraventricular hemorrhage, no ventriculomegaly.  Trace subarachnoid hemorrhage largely resolved.  6/12 EEG: right frontocentral seizures X2   Micro Data:   RVP 4/16 neg  MRSA PCR  4/17 neg   ======== 5/29 Left pleural fluid culture > negative 5/29 left pleural fluid cytology >> No malignant cells identified. Blood and acute inflammation.  5/30 BAL >> rare candida albicans 6/06 L Pleural Fluid >> negative 6/06 BCx2 >> negative   Antimicrobials:  Cefepime 4/24 >> 4/25 Zosyn 4/28 >> 5/4 Zosyn 5/30 >> 6/5 Cefepime 6/6 >> 6/12  Interim history/subjective:   Remains critically ill. On MV and on CVVHD  Objective   Blood pressure (!) 142/51, pulse 61, temperature 97.7 F (36.5 C), resp. rate (!) 27, height  _0  (1.626 m), weight 73.8 kg, SpO2 100 %.    Vent Mode: PRVC FiO2 (%):  [30 %-40 %] 40 % Set Rate:  [20 bmp] 20 bmp Vt Set:  [430 mL] 430 mL PEEP:  [5 cmH20] 5 cmH20 Pressure Support:  [12 cmH20-15 cmH20] 12 cmH20 Plateau Pressure:  [24 cmH20-25 cmH20] 25 cmH20     Intake/Output Summary (Last 24 hours) at 01/01/2020 1026 Last data filed at 01/01/2020 1000 Gross per 24 hour  Intake 2516.05 ml  Output 4946 ml  Net -2429.95 ml   Filed Weights   12/30/19 0400 12/31/19 0335 01/01/20 0500  Weight: 81.3 kg 73.2 kg 73.8 kg    Examination:  General: elderly, fm, chronically critially ill, on life support  HEENT: s/p right hemi-crani  Neuro: sedate. Not on meds, does not follow commands CV: RRR, s1 s2  PULM: BL vented breaths  GI: soft, nt nd  Extremities: BL edema dependent   Assessment & Plan:   Status Epilepticus, likely related to underlying stroke history :EEG off 6/14 - AEDS per neuro, prn ativan   Acute Respiratory Failure with Hypoxemia Mucus Plugging, Suspected Aspiration   Left Pleural Effusion s/p Thoracentesis, L Chest Tube  Right pleural effusion, worsening - adult mech vent protocol - mental status precludes liberation  - likely needs trach - we will consent family  - consider pigtail to right chest   AKI secondary to shock - no evidence of obstruction on renal US. Hypernatremia. 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  Evaluted by neurosurgery who felt that CTs were stable and no surgical evacuation or EVD was warranted. Neurosurgery with no plans for intervention at this time - protection of right hemicraniectomy site   DM2 with Hyperglycemia -- CBG, SSI and lantus   Chronic Atrial Fibrillation s/p Pacemaker - telemetry  - anticoagulation held due to bleeding   Moderate Protein Calorie Malnutrition  - tube feeding continued   Goals of Care P: - full code   Resolved: - Hemorrhagic shock secondary to hemothorax  Best practice:  Diet: TF  Pain/Anxiety/Delirium protocol (if indicated): Off all continuous sedation at this time VAP protocol (if indicated): yes DVT prophylaxis: SCDs GI prophylaxis:  PPI Glucose control: SSI Mobility: BR Code Status: full code Family Communication: updated daughter yesterday afternoon  Disposition:  ICU  This patient is critically ill with multiple organ system failure; which, requires frequent high complexity decision making, assessment, support, evaluation, and titration of therapies. This was completed through the application of advanced monitoring technologies and extensive interpretation of multiple databases. During this encounter critical care time was devoted to patient care services described in this note for 31 minutes.  [place] [facility] 01/01/2020 10:28 AM

## 2020-01-01 NOTE — Progress Notes (Signed)
[facility] ROUNDING NOTE   Subjective:   This is a 64 year old lady with extensive cardiac history including mitral valve repair 2001 and pacemaker placement 2001 with a history of sick sinus syndrome.  She also has a history of chronic atrial fibrillation, and diabetes.  She was admitted to [facility] in April 2021 with right MCA stroke right interventional guided thrombectomy 10/31/2019 significant brain edema status post craniotomy after TPA.  During her stay in rehab she developed respiratory distress with an enlarging pleural effusion underwent thoracentesis.  She was subsequently intubated and developed hemorrhagic shock.  She required VATS procedure 12/27/2019.  This was complicated by acute kidney injury and chronic renal replacement therapy was initiated 12/26/2019.  No heparin was used secondary to hemorrhagic shock.  She continues on CRRT.  Very poor urine output noted.  Removal of left anterior chest tube 12/30/2019.  Appreciate assistance of Dr. Prescott Gum.  .  Conversations with family continue.  Blood pressure 132/49 pulse 62 temperature 98 O2 sats understand FiO2 40%  Ultrafiltration managing to pull 100 to 150 cc an hour  Sodium 133 potassium 5.1 chloride 99 CO2 24 BUN 19 creatinine 1.18 glucose 210 calcium 7.8 phosphorus 2.9 magnesium 2.6 albumin 1.8 hemoglobin 8.2 WBC 24.7 platelets 121  IV amiodarone IV Keppra 750 mg every 12 hours  Amantadine 100 mg twice daily, aspirin 325 mg day Lipitor 40 mg daily vitamin B complex 1/day, insulin sliding scale, Lantus 10 units daily, Protonix 40 mg daily Dilantin 100 mg every 8 hours   Objective:  Vital signs in last 24 hours:  Temp:  [97.2 F (36.2 C)-98.8 F (37.1 C)] 98.6 F (37 C) (06/17 0500) Pulse Rate:  [59-72] 65 (06/17 0500) Resp:  [22-42] 35 (06/17 0500) BP: (111-180)/(38-115) 136/50 (06/17 0500) SpO2:  [90 %-100 %] 100 % (06/17 0500) FiO2 (%):  [30 %-40 %] 40 % (06/17 0400) Weight:  [73.8 kg] 73.8  kg (06/17 0500)  Weight change: 0.6 kg Filed Weights   12/30/19 0400 12/31/19 0335 01/01/20 0500  Weight: 81.3 kg 73.2 kg 73.8 kg    Intake/Output: I/O last 3 completed shifts: In: 5132 [I.V.:1126.6; NG/GT:3160; IV Piggyback:845.4] Out: 7486 [Urine:30; MBEML:5449; Stool:20; Chest Tube:90]   Intake/Output this shift:  Total I/O In: 874.8 [I.V.:166.8; NG/GT:600; IV Piggyback:108] Out: 1405 [Urine:5; Other:1380; Stool:10; Chest Tube:10]  General: Critically ill looking female intubated, sedated, not responding Heart:RRR, s1s2 nl, no rubs Lungs: Coarse breath sound bilateral Abdomen:soft, Non-tender, non-distended Extremities: Bilateral lower extremity edema present Dialysis Access: Left IJ temporary HD catheter, site looks clean Neurology: Sedated.   Basic Metabolic Panel: Recent Labs  Lab 12/28/19 0320 12/28/19 1503 12/29/19 0353 12/29/19 1508 12/30/19 0445 12/30/19 0445 12/30/19 1617 12/30/19 1617 12/31/19 0347 12/31/19 0347 12/31/19 1614 12/31/19 2317 01/01/20 0433  NA 134*   < > 133*   < > 134*   < > 134*  --  134*  --  135 134* 133*  K 4.0   < > 4.1   < > 4.2   < > 4.5  --  4.9  --  5.5* 5.1 5.1  CL 99   < > 100   < > 100   < > 101  --  102  --  102 100 99  CO2 22   < > 25   < > 25   < > 25  --  24  --  _0 GLUCOSE 154*   < > 233*   < >  167*   < > 185*  --  151*  --  158* 231* 210*  BUN 37*   < > 20   < > 14   < > 15  --  15  --  _0 CREATININE 1.94*   < > 1.44*   < > 1.21*   < > 1.26*  --  1.15*  --  1.33* 1.19* 1.18*  CALCIUM 7.6*   < > 7.9*   < > 7.9*   < > 7.8*   < > 7.8*   < > 7.6* 7.8* 7.8*  MG 2.2  --  2.3  --  2.3  --   --   --  2.4  --   --   --  2.6*  PHOS 3.4   < > 2.3*   < > 1.8*  --  2.2*  --  2.3*  --  3.9  --  2.9   < > = values in this interval not displayed.    Liver Function Tests: Recent Labs  Lab 12/30/19 0445 12/30/19 1617 12/31/19 0347 12/31/19 1614 01/01/20 0433  ALBUMIN 1.6* 1.7* 1.7* 1.6* 1.8*   No results for  input(s): LIPASE, AMYLASE in the last 168 hours. No results for input(s): AMMONIA in the last 168 hours.  CBC: Recent Labs  Lab 12/28/19 0711 12/29/19 0353 12/30/19 0445 12/31/19 0347 01/01/20 0433  WBC 18.4* 15.9* 15.9* 21.4* 24.7*  HGB 8.1* 8.0* 7.7* 8.5* 8.2*  HCT 25.5* 25.6* 25.3* 28.2* 27.0*  MCV 94.4 96.6 97.7 98.9 100.4*  PLT 149* 146* 123* 130* 121*    Cardiac Enzymes: No results for input(s): CKTOTAL, CKMB, CKMBINDEX, TROPONINI in the last 168 hours.  BNP: Invalid input(s): POCBNP  CBG: Recent Labs  Lab 12/31/19 1133 12/31/19 1546 12/31/19 2014 01/01/20 0000 01/01/20 0445  GLUCAP 133* 148* 158* 222* 215*    Microbiology: Results for orders placed or performed during the hospital encounter of 12/08/2019  MRSA PCR Screening     Status: None   Collection Time: 11/25/2019 11:00 AM   Specimen: Nasopharyngeal  Result Value Ref Range Status   MRSA by PCR NEGATIVE NEGATIVE Final    Comment:        The GeneXpert MRSA Assay (FDA approved for NASAL specimens only), is one component of a comprehensive MRSA colonization surveillance program. It is not intended to diagnose MRSA infection nor to guide or monitor treatment for MRSA infections. Performed at [facility], [place] [address] [place], [place] [address]   Culture, bal-quantitative     Status: Abnormal   Collection Time: 12/01/2019 11:37 AM   Specimen: Bronchoalveolar Lavage  Result Value Ref Range Status   Specimen Description BRONCHIAL ALVEOLAR LAVAGE  Final   Special Requests NONE  Final   Gram Stain   Final    FEW WBC PRESENT,BOTH PMN AND MONONUCLEAR NO ORGANISMS SEEN    Culture (A)  Final    1,000 COLONIES/mL Consistent with normal respiratory flora. Performed at [facility], [place] [address], [place], [place] [address]    Report Status 12/16/2019 FINAL  Final  Culture, blood (routine x 2)     Status: None   Collection Time: 12/16/2019  6:11 AM   Specimen: BLOOD  Result Value Ref Range  Status   Specimen Description BLOOD RIGHT HAND  Final   Special Requests   Final    BOTTLES DRAWN AEROBIC ONLY Blood Culture results may not be optimal due to an inadequate  volume of blood received in culture bottles   Culture   Final    NO GROWTH 5 DAYS Performed at [facility], [place] [address], [place], [place] [address]    Report Status 12/26/2019 FINAL  Final  Culture, blood (routine x 2)     Status: None   Collection Time: 12/16/2019  6:16 AM   Specimen: BLOOD  Result Value Ref Range Status   Specimen Description BLOOD RIGHT ARM  Final   Special Requests   Final    BOTTLES DRAWN AEROBIC ONLY Blood Culture results may not be optimal due to an inadequate volume of blood received in culture bottles   Culture   Final    NO GROWTH 5 DAYS Performed at [facility], [place] [address], [place], [place] [address]    Report Status 12/26/2019 FINAL  Final  Body fluid culture     Status: None   Collection Time: 01/05/2020  3:06 PM   Specimen: PATH Cytology Pleural fluid; Body Fluid  Result Value Ref Range Status   Specimen Description PLEURAL FLUID  Final   Special Requests LEFT A  Final   Gram Stain   Final    MODERATE WBC PRESENT, PREDOMINANTLY PMN NO ORGANISMS SEEN    Culture   Final    NO GROWTH 3 DAYS Performed at [facility], [address] [place], [place] [address]    Report Status 12/25/2019 FINAL  Final  Aerobic/Anaerobic Culture (surgical/deep wound)     Status: None   Collection Time: 12/29/2019  3:06 PM   Specimen: PATH Cytology Pleural fluid; Body Fluid  Result Value Ref Range Status   Specimen Description PLEURAL  Final   Special Requests FLUID LEFT  Final   Gram Stain   Final    RARE WBC PRESENT, PREDOMINANTLY PMN NO ORGANISMS SEEN    Culture   Final    No growth aerobically or anaerobically. Performed at [facility], [place] [address], [place], [place] [address]    Report Status 12/26/2019 FINAL  Final  Culture, respiratory  (non-expectorated)     Status: None   Collection Time: 12/22/19  8:27 AM   Specimen: Tracheal Aspirate; Respiratory  Result Value Ref Range Status   Specimen Description TRACHEAL ASPIRATE  Final   Special Requests Normal  Final   Gram Stain   Final    RARE WBC PRESENT, PREDOMINANTLY PMN NO ORGANISMS SEEN Performed at [facility], [place] [address] [place], [place] [address]    Culture RARE CANDIDA ALBICANS  Final   Report Status 12/25/2019 FINAL  Final    Coagulation Studies: No results for input(s): LABPROT, INR in the last 72 hours.  Urinalysis: No results for input(s): COLORURINE, LABSPEC, PHURINE, GLUCOSEU, HGBUR, BILIRUBINUR, KETONESUR, PROTEINUR, UROBILINOGEN, NITRITE, LEUKOCYTESUR in the last 72 hours.  Invalid input(s): APPERANCEUR    Imaging: DG Abd 1 View  Result Date: 12/31/2019 CLINICAL DATA:  Abdominal distension. EXAM: ABDOMEN - 1 VIEW COMPARISON:  Radiographs 11/18/2019 and 11/09/2019.  CT 12/25/2019. FINDINGS: 1808 hours. Two views are submitted. Feeding tube projects to the level of the mid stomach. Cardiac pacemaker leads are noted. The bowel gas pattern is nonobstructive. There is gas throughout the small and large bowel. No supine evidence of free intraperitoneal air. Calcification within the left upper quadrant of the abdomen corresponds with a chronic splenic calcification on CT. Small bilateral pleural effusions are noted. IMPRESSION: Nonobstructive bowel gas pattern. Feeding tube tip in the mid  stomach. Electronically Signed   By: Richardean Sale M.D.   On: 12/31/2019 18:45   DG CHEST PORT 1 VIEW  Result Date: 12/31/2019 CLINICAL DATA:  Intubated.  Chest tube. EXAM: PORTABLE CHEST 1 VIEW COMPARISON:  12/30/2019 and CT chest 12/04/2019. FINDINGS: Endotracheal tube terminates approximately 4.5 cm above the carina. Left PICC and left IJ central line tips are in the SVC. Right-sided pacemaker lead tips are in the right atrium and right ventricle. Two left chest  tubes are in place with tips at the apex and base of the left hemithorax. Heart is enlarged, stable. Basilar predominant mixed interstitial and airspace opacification with moderate bilateral pleural effusions, partially loculated on the left. Aeration in the right lower lobe has worsened in the interval. Left thoracotomies with surgical skin staples over the lateral left chest. IMPRESSION: 1. Partially loculated moderate left pleural effusion with 2 left chest tubes in place, status post left thoracotomy for hemothorax evacuation 12/28/2019. No pneumothorax. 2. Mixed interstitial and airspace opacification bilaterally with interval worsening in aeration in the right lower lobe. Findings may be due to a combination of atelectasis and pneumonia. Aspiration is not excluded. 3. Moderate right pleural effusion, increased. Electronically Signed   By: Lorin Picket M.D.   On: 12/31/2019 09:29     Medications:   .  prismasol BGK 4/2.5 500 mL/hr at 12/31/19 1313  .  prismasol BGK 4/2.5 300 mL/hr at 12/30/19 0448  . sodium chloride Stopped (12/30/19 2139)  . amiodarone 30 mg/hr (01/01/20 0500)  . dexmedetomidine (PRECEDEX) IV infusion Stopped (12/31/19 1428)  . feeding supplement (VITAL AF 1.2 CAL) 1,000 mL (12/31/19 1146)  . fentaNYL infusion INTRAVENOUS Stopped (12/31/19 1440)  . levETIRAcetam Stopped (12/31/19 2132)  . norepinephrine (LEVOPHED) Adult infusion Stopped (12/28/19 0817)  . prismasol BGK 4/2.5 1,500 mL/hr at 12/31/19 1610   . amantadine  100 mg Per Tube BID  . aspirin  325 mg Per Tube Daily  . atorvastatin  40 mg Per Tube Daily  . B-complex with vitamin C  1 tablet Per Tube Daily  . chlorhexidine gluconate (MEDLINE KIT)  15 mL Mouth Rinse BID  . Chlorhexidine Gluconate Cloth  6 each Topical Q0600  . docusate  100 mg Per Tube BID  . feeding supplement (PRO-STAT SUGAR FREE 64)  30 mL Per Tube Daily  . insulin aspart  0-20 Units Subcutaneous Q4H  . insulin glargine  5 Units Subcutaneous  BID  . mouth rinse  15 mL Mouth Rinse 10 times per day  . pantoprazole (PROTONIX) IV  40 mg Intravenous Q24H  . phenytoin (DILANTIN) IV  100 mg Intravenous Q8H  . polyethylene glycol  17 g Per Tube TID  . senna-docusate  1 tablet Per Tube BID  . sodium chloride flush  10-40 mL Intracatheter Q12H   sodium chloride, acetaminophen, bisacodyl, fentaNYL, hydrALAZINE, midazolam, ondansetron (ZOFRAN) IV, oxyCODONE, senna-docusate, sodium chloride flush  Assessment/ Plan:   Acute kidney injury, oliguric ischemic ATN secondary to hemorrhagic shock requiring pressor support.  Refractory to IV diuretics initiated on CRRT 12/26/2019 left IJ temporary dialysis catheter per CCM.  Negative fluid balance on CRRT 100-150 cc an hour.  ANEMIA-transfusion 5 units packed red blood cells 9 units fresh frozen plasma 2 units cryoprecipitate.  Last transfusion 01/06/2020  Chronic atrial fibrillation status post pacemaker anticoagulation on hold.  Rate controlled with amiodarone  Hemorrhagic shock secondary to hemothorax status post multiple transfusions 12/28/2019.    Metabolic acidosis appears to be controlled with CRRT  Hemorrhagic  right MCA infarction seizure having received thrombectomy complicated by brain edema and craniectomy 10/31/2019  Diabetes mellitus insulin sliding scale  Seizure prophylaxis continues with Dilantin  Hypophosphatemia.  Improved we will follow     LOS: [place] _0 _1 :40 AM

## 2020-01-01 NOTE — Progress Notes (Signed)
Subjective: No acute events overnight.  Patient's daughter at bedside today.  ROS: unable to obtain due to poor mental status  Examination  Vital signs in last 24 hours: Temp:  [97.7 F (36.5 C)-98.8 F (37.1 C)] 97.7 F (36.5 C) (06/17 1000) Pulse Rate:  [59-72] 61 (06/17 1000) Resp:  [21-42] 27 (06/17 1000) BP: (111-147)/(38-115) 142/51 (06/17 1000) SpO2:  [90 %-100 %] 100 % (06/17 1000) FiO2 (%):  [30 %-40 %] 40 % (06/17 0812) Weight:  [73.8 kg] 73.8 kg (06/17 0500)  General: lying in bed,not in apparent distress CVS: pulse-normal rate and rhythm RS: breathing comfortably,intubated Extremities: normal,warm  Neuro: EX:BMWUX eyes  some spontaneously but mostly to noxious stimuli, does not follow commands CN: pupils equal and reactive,right gaze preference with left side neglect, +cough reflex, rest CN difficult to assess due to intubation Motor:Withdraws to noxious stimuli in all 4 extremities with2/5strength in bilateral upper extremities ( R>L) distally and 2/5 in bilateral lower extremities    Basic Metabolic Panel: Recent Labs  Lab 12/28/19 0320 12/28/19 1503 12/29/19 0353 12/29/19 1508 12/30/19 0445 12/30/19 0445 12/30/19 1617 12/30/19 1617 12/31/19 0347 12/31/19 0347 12/31/19 1614 12/31/19 2317 01/01/20 0433  NA 134*   < > 133*   < > 134*   < > 134*  --  134*  --  135 134* 133*  K 4.0   < > 4.1   < > 4.2   < > 4.5  --  4.9  --  5.5* 5.1 5.1  CL 99   < > 100   < > 100   < > 101  --  102  --  102 100 99  CO2 22   < > 25   < > 25   < > 25  --  24  --  22 22 24   GLUCOSE 154*   < > 233*   < > 167*   < > 185*  --  151*  --  158* 231* 210*  BUN 37*   < > 20   < > 14   < > 15  --  15  --  17 19 19   CREATININE 1.94*   < > 1.44*   < > 1.21*   < > 1.26*  --  1.15*  --  1.33* 1.19* 1.18*  CALCIUM 7.6*   < > 7.9*   < > 7.9*   < > 7.8*   < > 7.8*   < > 7.6* 7.8* 7.8*  MG 2.2  --  2.3  --  2.3  --   --   --  2.4  --   --   --  2.6*  PHOS 3.4   < > 2.3*   < >  1.8*  --  2.2*  --  2.3*  --  3.9  --  2.9   < > = values in this interval not displayed.    CBC: Recent Labs  Lab 12/28/19 0711 12/29/19 0353 12/30/19 0445 12/31/19 0347 01/01/20 0433  WBC 18.4* 15.9* 15.9* 21.4* 24.7*  HGB 8.1* 8.0* 7.7* 8.5* 8.2*  HCT 25.5* 25.6* 25.3* 28.2* 27.0*  MCV 94.4 96.6 97.7 98.9 100.4*  PLT 149* 146* 123* 130* 121*     Coagulation Studies: No results for input(s): LABPROT, INR in the last 72 hours.  Imaging No new brain imaging overnight  ASSESSMENT AND PLAN:Krystal Little is a 64 year old female with recent right MCA hemorrhagic infarct status post hemicraniectomy was then noted to  have seizures and is currently on Keppra and fosphenytoin.  Right hemorrhagic MCA infarct status post hemicraniectomy Intraventricular hemorrhage Seizures post stroke -Fluctuating exam where patient was more awake and moving all 4 extremities on 12/30/2019, difficult to arouse and not moving extremities on 12/31/2019 and again waking up to noxious stimuli moving all 4 extremities today. Patient was on small amount of Precedex and fentanyl on 12/31/2019 while on pressor support which may have contributed to worsening neurologic exam.  Recommendations -Continuecurrent AEDs:Keppra750mg  twice daily, Dilantin 100 mg every 8 hours. CorrectedDilantin level on 12/29/2019 19.4.  Will recheck Dilantin trough level tomorrow a.m. -If patient has worsening neurologic exam, can consider CT head without contrast and EEG to look for potential acute abnormalities, seizures -At this point, patient has multiple comorbidities with prolonged and complicated hospital course which will significantly impact her neurologic recovery.  -Discussed patient's fluctuating mental status, impact of brain injury (stroke and seizures) age, other comorbidities and patient's recovery with patient's daughter at bedside who expressed understanding. -Continue seizure precautions -Continue to minimize  sedation per critical care team -As needed IV Ativan 2 mg for clinical seizure-like activity -Continue management of right MCA infarct status post hemicraniectomy per neurosurgery and stroke team -We will defer management of rest of comorbidities to primary team   Thank you for allowing [place] to participate in the care of this patient.  Neurology will sign off.  Please reconsult [place] for any further questions.  I have spent a total of  35 minutes with the patient reviewing hospital notes,  test results, labs and examining the patient as well as establishing an assessment and plan that was discussed personally with the patient's daughter as described above in recommendations.  > 50% of time was spent in direct patient care.

## 2020-01-01 NOTE — Progress Notes (Addendum)
      [address]       [place] [address]             [phone]      11 Days Post-Op Procedure(s) (LRB): Video Assisted Thoracoscopy (Vats) left , Drainage of Hemothorax (Left) Video Bronchoscopy (N/A) Subjective: Intubated and sedated, non-responsive  Objective: Vital signs in last 24 hours: Temp:  [97.7 F (36.5 C)-98.8 F (37.1 C)] 97.9 F (36.6 C) (06/17 0800) Pulse Rate:  [59-72] 64 (06/17 0812) Cardiac Rhythm: Normal sinus rhythm;Ventricular paced (06/17 0400) Resp:  [22-42] 36 (06/17 0812) BP: (111-148)/(38-115) 133/46 (06/17 0800) SpO2:  [90 %-100 %] 99 % (06/17 0812) FiO2 (%):  [30 %-40 %] 40 % (06/17 0812) Weight:  [73.8 kg] 73.8 kg (06/17 0500)     Intake/Output from previous day: 06/16 0701 - 06/17 0700 In: 2669.6 [I.V.:522.2; NG/GT:1670; IV Piggyback:477.4] Out: 9449 [Urine:15; Stool:10; Chest Tube:20] Intake/Output this shift: Total I/O In: 76.7 [I.V.:16.7; NG/GT:60] Out: 170 [Other:170]  General appearance: intubated/sedated Heart: regular rate and rhythm, S1, S2 normal, no murmur, click, rub or gallop Lungs: coarse ventilator sounds Abdomen: soft, non-tender; bowel sounds normal; no masses,  no organomegaly Extremities: 1+ pitting edema in left hand, improved in right hand and lower ext Wound: clean and dry  Lab Results: Recent Labs    12/31/19 0347 01/01/20 0433  WBC 21.4* 24.7*  HGB 8.5* 8.2*  HCT 28.2* 27.0*  PLT 130* 121*   BMET:  Recent Labs    12/31/19 2317 01/01/20 0433  NA 134* 133*  K 5.1 5.1  CL 100 99  CO2 22 24  GLUCOSE 231* 210*  BUN 19 19  CREATININE 1.19* 1.18*  CALCIUM 7.8* 7.8*    PT/INR: No results for input(s): LABPROT, INR in the last 72 hours. ABG    Component Value Date/Time   PHART 7.403 12/22/2019 0348   HCO3 22.8 12/22/2019 0348   TCO2 24 12/22/2019 0348   ACIDBASEDEF 2.0 12/22/2019 0348   O2SAT 99.0 12/22/2019 0348   CBG (last 3)  Recent Labs    01/01/20 0000  01/01/20 0445 01/01/20 0743  GLUCAP 222* 215* 169*    Assessment/Plan: S/P Procedure(s) (LRB): Video Assisted Thoracoscopy (Vats) left , Drainage of Hemothorax (Left) Video Bronchoscopy (N/A)  1. Minimum output from the chest tubes on the left side. Would recommend another CXR to evaluate the effusion accumulating on the right. Keep chest tubes for now.  2. Trach planned for tomorrow.  3. CRRT -per Nephrology. Her fluid balance has been negative for the last few days 4. Neuro plans to get a head CT and EEG today  Plan: Keep chest tube for now. Recommend daily CXR.    LOS: 18 days    Elgie Collard 01/01/2020   patient examined and medical record reviewed,agree with above note. Tharon Aquas Trigt III 01/01/2020

## 2020-01-01 NOTE — Progress Notes (Signed)
Nutrition Follow-up  DOCUMENTATION CODES:   Not applicable  INTERVENTION:   Tube Feeding viaCortrak:  Vital AF 1.2 to 60 ml/hr Pro-Stat 30 mL daily Provides 1828 kcals, 112 g of protein and 1094 mL of free water Meets 100% estimated calorie and protein needs  Continue B-complex with C  Recommend considering PEG placement at this time if pt remains Full Code    NUTRITION DIAGNOSIS:   Inadequate oral intake related to acute illness as evidenced by NPO status.  Being addressed via TF   GOAL:   Patient will meet greater than or equal to 90% of their needs  Met  MONITOR:   Vent status, TF tolerance, Labs, Weight trends  REASON FOR ASSESSMENT:   Consult, Ventilator Enteral/tube feeding initiation and management  ASSESSMENT:   64 yo female admitted from [place] after being at [facility] in April in setting of R. MCA stroke requiring thrombectomy, developed significant brain edema requiring craniectomy. Pt developed L pleural effusion on Rehab requiring thoracentesis and ultimately required intubation due to resp failure from occlusion of L. Mainstem bronchus and large L hemothorax, bronch and chest tube placement. Pt did require Cortrak placement with TF initially but was taking po prior to readmission from Rehab. PMH DM, HTN, R. MCA stroke, SSS  5/29 Thoracentesis 5/30 Intubated, Chest tube 6/04 Cortrak placed 6/06 Left VATS with drainage of left hemothorax 6/11 CRRT initiated 6/12 Seizures+ on EEG  Pt remains on Full Code Pt remains on CRRT, UF 100-150 mL/hr Patient is currently intubated on ventilator support, noted plan for possible trach MV: 16 L/min Temp (24hrs), Avg:98.2 F (36.8 C), Min:97.3 F (36.3 C), Max:98.8 F (37.1 C)  Tolerating Vital AF 1.2 at 60 ml/hr, Pro-Stat 30 mL daily via Cortrak  Abd xray from 6/16 with nonobstructive bowel gas pattern and Cortrak tube tip in mid stomach  Admit weight 61.3 kg; current weight 73.8 kg. Net +13 L. Current  estimated dry weight of 61 kg. Noted 1+ edema in all areas, improved from last assessment  Labs: sodium 133 (L), CBGs 133-222 (ICU goal 140-180) Meds: B-complex with C, ss novolog, lantus, miralax,senokot  Diet Order:   Diet Order            Diet NPO time specified  Diet effective now                 EDUCATION NEEDS:   Not appropriate for education at this time  Skin:  Skin Assessment: Skin Integrity Issues: Skin Integrity Issues:: Other (Comment) Other: fissure to mid coccyx, venous stasis ulcers on legs, MASD to groin and sacrum  Last BM:  6/17 rectal tube  Height:   Ht Readings from Last 1 Encounters:  11/21/2019 _0  (1.626 m)    Weight:   Wt Readings from Last 1 Encounters:  01/01/20 73.8 kg    BMI:  Body mass index is 27.93 kg/m.  Estimated Nutritional Needs:   Kcal:  1525-1830 kcals  Protein:  90-120 g  Fluid:  >/= 1.6 L   Kerman Passey MS, RDN, LDN, CNSC Registered Dietitian III RD Pager Number and RD On-Call Pager Number Located in [place]

## 2020-01-02 LAB — CBC
HCT: 27.6 % — ABNORMAL LOW (ref 36.0–46.0)
HCT: 28 % — ABNORMAL LOW (ref 36.0–46.0)
Hemoglobin: 8.3 g/dL — ABNORMAL LOW (ref 12.0–15.0)
Hemoglobin: 8.4 g/dL — ABNORMAL LOW (ref 12.0–15.0)
MCH: 30.2 pg (ref 26.0–34.0)
MCH: 30.4 pg (ref 26.0–34.0)
MCHC: 30.1 g/dL (ref 30.0–36.0)
MCHC: 30 g/dL (ref 30.0–36.0)
MCV: 100.4 fL — ABNORMAL HIGH (ref 80.0–100.0)
MCV: 101.4 fL — ABNORMAL HIGH (ref 80.0–100.0)
Platelets: 107 10*3/uL — ABNORMAL LOW (ref 150–400)
Platelets: 92 10*3/uL — ABNORMAL LOW (ref 150–400)
RBC: 2.75 MIL/uL — ABNORMAL LOW (ref 3.87–5.11)
RBC: 2.76 MIL/uL — ABNORMAL LOW (ref 3.87–5.11)
RDW: 23.8 % — ABNORMAL HIGH (ref 11.5–15.5)
RDW: 23.9 % — ABNORMAL HIGH (ref 11.5–15.5)
WBC: 20.5 10*3/uL — ABNORMAL HIGH (ref 4.0–10.5)
WBC: 17.9 10*3/uL — ABNORMAL HIGH (ref 4.0–10.5)
nRBC: 0.1 % (ref 0.0–0.2)
nRBC: 0.2 % (ref 0.0–0.2)

## 2020-01-02 LAB — RENAL FUNCTION PANEL
Albumin: 1.7 g/dL — ABNORMAL LOW (ref 3.5–5.0)
Albumin: 1.7 g/dL — ABNORMAL LOW (ref 3.5–5.0)
Anion gap: 9 (ref 5–15)
Anion gap: 8 (ref 5–15)
BUN: 20 mg/dL (ref 8–23)
BUN: 21 mg/dL (ref 8–23)
CO2: 25 mmol/L (ref 22–32)
CO2: 26 mmol/L (ref 22–32)
Calcium: 7.8 mg/dL — ABNORMAL LOW (ref 8.9–10.3)
Calcium: 7.4 mg/dL — ABNORMAL LOW (ref 8.9–10.3)
Chloride: 100 mmol/L (ref 98–111)
Chloride: 101 mmol/L (ref 98–111)
Creatinine, Ser: 1.13 mg/dL — ABNORMAL HIGH (ref 0.44–1.00)
Creatinine, Ser: 1.06 mg/dL — ABNORMAL HIGH (ref 0.44–1.00)
GFR calc Af Amer: 59 mL/min — ABNORMAL LOW (ref >60)
GFR calc Af Amer: >60 mL/min (ref >60)
GFR calc non Af Amer: 51 mL/min — ABNORMAL LOW (ref >60)
GFR calc non Af Amer: 55 mL/min — ABNORMAL LOW (ref >60)
Glucose, Bld: 144 mg/dL — ABNORMAL HIGH (ref 70–99)
Glucose, Bld: 169 mg/dL — ABNORMAL HIGH (ref 70–99)
Phosphorus: 2.1 mg/dL — ABNORMAL LOW (ref 2.5–4.6)
Phosphorus: 2.7 mg/dL (ref 2.5–4.6)
Potassium: 4.7 mmol/L (ref 3.5–5.1)
Potassium: 4.6 mmol/L (ref 3.5–5.1)
Sodium: 134 mmol/L — ABNORMAL LOW (ref 135–145)
Sodium: 135 mmol/L (ref 135–145)

## 2020-01-02 LAB — GLUCOSE, CAPILLARY
Glucose-Capillary: 190 mg/dL — ABNORMAL HIGH (ref 70–99)
Glucose-Capillary: 141 mg/dL — ABNORMAL HIGH (ref 70–99)
Glucose-Capillary: 139 mg/dL — ABNORMAL HIGH (ref 70–99)
Glucose-Capillary: 169 mg/dL — ABNORMAL HIGH (ref 70–99)
Glucose-Capillary: 163 mg/dL — ABNORMAL HIGH (ref 70–99)
Glucose-Capillary: 168 mg/dL — ABNORMAL HIGH (ref 70–99)

## 2020-01-02 LAB — MAGNESIUM: Magnesium: 2.4 mg/dL (ref 1.7–2.4)

## 2020-01-02 LAB — PHENYTOIN LEVEL, TOTAL: Phenytoin Lvl: 8.4 ug/mL — ABNORMAL LOW (ref 10.0–20.0)

## 2020-01-02 MED ORDER — SODIUM CHLORIDE 0.9 % IR SOLN
3000.0000 mL | Status: DC
  Administered 2020-01-02 – 2020-01-05 (×10): 3000 mL

## 2020-01-02 MED ORDER — SODIUM PHOSPHATES 45 MMOLE/15ML IV SOLN
20.0000 mmol | Freq: Once | INTRAVENOUS | Status: AC
  Administered 2020-01-02: 20 mmol via INTRAVENOUS
  Filled 2020-01-02: qty 6.67

## 2020-01-02 MED ORDER — FENTANYL CITRATE (PF) 100 MCG/2ML IJ SOLN
25.0000 ug | INTRAMUSCULAR | Status: DC | PRN
  Administered 2020-01-05 – 2020-01-22 (×4): 25 ug via INTRAVENOUS
  Filled 2020-01-02 (×4): qty 2

## 2020-01-02 NOTE — Progress Notes (Signed)
[facility] ROUNDING NOTE   Subjective:   This is a 64 year old lady with extensive cardiac history including mitral valve repair 2001 and pacemaker placement 2001 with a history of sick sinus syndrome.  She also has a history of chronic atrial fibrillation, and diabetes.  She was admitted to [facility] in April 2021 with right MCA stroke right interventional guided thrombectomy 10/31/2019 significant brain edema status post craniotomy after TPA.  During her stay in rehab she developed respiratory distress with an enlarging pleural effusion underwent thoracentesis.  She was subsequently intubated and developed hemorrhagic shock.  She required VATS procedure 01/11/2020.  This was complicated by acute kidney injury and chronic renal replacement therapy was initiated 12/26/2019.  No heparin was used secondary to hemorrhagic shock.  She continues on CRRT.  Very poor urine output noted.  Removal of left anterior chest tube 12/30/2019.  Appreciate assistance of Dr. Prescott Gum.  .  Conversations with family continue.  Planning trach for 01/02/2020  Blood pressure 153/52 pulse 62 temperature 96 O2 sats 90% 40% FiO2  Ultrafiltration managing to pull 100 to 150 cc an hour  Sodium 134 potassium 4.7 chloride 100 CO2 25 BUN 20 creatinine 1.13 glucose 114 calcium 7.8 phosphorus 2.1 magnesium 2.4 albumin 1.7.  Hemoglobin 8.2 WBC 24.7 platelets 121  IV amiodarone IV Keppra 750 mg every 12 hours  Amantadine 100 mg twice daily, aspirin 325 mg day Lipitor 40 mg daily vitamin B complex 1/day, insulin sliding scale, Lantus 10 units daily, Protonix 40 mg daily Dilantin 100 mg every 8 hours   Objective:  Vital signs in last 24 hours:  Temp:  [96.4 F (35.8 C)-98.2 F (36.8 C)] 96.4 F (35.8 C) (06/18 0500) Pulse Rate:  [60-66] 61 (06/18 0500) Resp:  [21-40] 32 (06/18 0500) BP: (120-161)/(41-65) 159/53 (06/18 0500) SpO2:  [95 %-100 %] 100 % (06/18 0500) FiO2 (%):  [40 %] 40 % (06/18  0400) Weight:  [71.2 kg] 71.2 kg (06/18 0416)  Weight change: -2.6 kg Filed Weights   12/31/19 0335 01/01/20 0500 01/02/20 0416  Weight: 73.2 kg 73.8 kg 71.2 kg    Intake/Output: I/O last 3 completed shifts: In: 4107.3 [I.V.:721.8; Other:10; NG/GT:2790; IV Piggyback:585.4] Out: 7910 [Urine:30; Other:7850; Stool:10; Chest Tube:20]   Intake/Output this shift:  Total I/O In: 873.9 [I.V.:165.9; NG/GT:600; IV Piggyback:108] Out: 1648 [Urine:5; QMGNO:0370; Stool:20]  General: Critically ill looking female intubated, sedated, not responding Heart:RRR, s1s2 nl, no rubs Lungs: Coarse breath sound bilateral Abdomen:soft, Non-tender, non-distended Extremities: Bilateral lower extremity edema present Dialysis Access: Left IJ temporary HD catheter, site looks clean Neurology: Sedated.   Basic Metabolic Panel: Recent Labs  Lab 12/29/19 0353 12/29/19 1508 12/30/19 0445 12/30/19 1617 12/31/19 0347 12/31/19 0347 12/31/19 1614 12/31/19 1614 12/31/19 2317 12/31/19 2317 01/01/20 0433 01/01/20 1536 01/02/20 0426  NA 133*   < > 134*   < > 134*   < > 135  --  134*  --  133* 134* 134*  K 4.1   < > 4.2   < > 4.9   < > 5.5*  --  5.1  --  5.1 4.8 4.7  CL 100   < > 100   < > 102   < > 102  --  100  --  99 100 100  CO2 25   < > 25   < > 24   < > 22  --  22  --  _0 GLUCOSE 233*   < >  167*   < > 151*   < > 158*  --  231*  --  210* 205* 144*  BUN 20   < > 14   < > 15   < > 17  --  19  --  _0 CREATININE 1.44*   < > 1.21*   < > 1.15*   < > 1.33*  --  1.19*  --  1.18* 1.14* 1.13*  CALCIUM 7.9*   < > 7.9*   < > 7.8*   < > 7.6*   < > 7.8*   < > 7.8* 7.9* 7.8*  MG 2.3  --  2.3  --  2.4  --   --   --   --   --  2.6*  --  2.4  PHOS 2.3*   < > 1.8*   < > 2.3*  --  3.9  --   --   --  2.9 2.4* 2.1*   < > = values in this interval not displayed.    Liver Function Tests: Recent Labs  Lab 12/31/19 0347 12/31/19 1614 01/01/20 0433 01/01/20 1536 01/02/20 0426  ALBUMIN 1.7* 1.6* 1.8*  1.7* 1.7*   No results for input(s): LIPASE, AMYLASE in the last 168 hours. No results for input(s): AMMONIA in the last 168 hours.  CBC: Recent Labs  Lab 12/28/19 0711 12/29/19 0353 12/30/19 0445 12/31/19 0347 01/01/20 0433  WBC 18.4* 15.9* 15.9* 21.4* 24.7*  HGB 8.1* 8.0* 7.7* 8.5* 8.2*  HCT 25.5* 25.6* 25.3* 28.2* 27.0*  MCV 94.4 96.6 97.7 98.9 100.4*  PLT 149* 146* 123* 130* 121*    Cardiac Enzymes: No results for input(s): CKTOTAL, CKMB, CKMBINDEX, TROPONINI in the last 168 hours.  BNP: Invalid input(s): POCBNP  CBG: Recent Labs  Lab 01/01/20 1123 01/01/20 1534 01/01/20 2110 01/02/20 0115 01/02/20 0432  GLUCAP 166* 194* 175* 190* 141*    Microbiology: Results for orders placed or performed during the hospital encounter of 12/08/2019  MRSA PCR Screening     Status: None   Collection Time: 11/30/2019 11:00 AM   Specimen: Nasopharyngeal  Result Value Ref Range Status   MRSA by PCR NEGATIVE NEGATIVE Final    Comment:        The GeneXpert MRSA Assay (FDA approved for NASAL specimens only), is one component of a comprehensive MRSA colonization surveillance program. It is not intended to diagnose MRSA infection nor to guide or monitor treatment for MRSA infections. Performed at [facility], [place] [address], [place], [place] [address]   Culture, bal-quantitative     Status: Abnormal   Collection Time: 11/26/2019 11:37 AM   Specimen: Bronchoalveolar Lavage  Result Value Ref Range Status   Specimen Description BRONCHIAL ALVEOLAR LAVAGE  Final   Special Requests NONE  Final   Gram Stain   Final    FEW WBC PRESENT,BOTH PMN AND MONONUCLEAR NO ORGANISMS SEEN    Culture (A)  Final    1,000 COLONIES/mL Consistent with normal respiratory flora. Performed at [facility], [place] [address] [place], [place] [address]    Report Status 12/16/2019 FINAL  Final  Culture, blood (routine x 2)     Status: None   Collection Time: 12/27/2019  6:11 AM   Specimen:  BLOOD  Result Value Ref Range Status   Specimen Description BLOOD RIGHT HAND  Final   Special Requests   Final    BOTTLES DRAWN AEROBIC ONLY Blood Culture results may not be  optimal due to an inadequate volume of blood received in culture bottles   Culture   Final    NO GROWTH 5 DAYS Performed at [facility], [place] [address] [place], [place] [address]    Report Status 12/26/2019 FINAL  Final  Culture, blood (routine x 2)     Status: None   Collection Time: 01/13/2020  6:16 AM   Specimen: BLOOD  Result Value Ref Range Status   Specimen Description BLOOD RIGHT ARM  Final   Special Requests   Final    BOTTLES DRAWN AEROBIC ONLY Blood Culture results may not be optimal due to an inadequate volume of blood received in culture bottles   Culture   Final    NO GROWTH 5 DAYS Performed at [facility], [place] [address] [place], [place] [address]    Report Status 12/26/2019 FINAL  Final  Body fluid culture     Status: None   Collection Time: 01/13/2020  3:06 PM   Specimen: PATH Cytology Pleural fluid; Body Fluid  Result Value Ref Range Status   Specimen Description PLEURAL FLUID  Final   Special Requests LEFT A  Final   Gram Stain   Final    MODERATE WBC PRESENT, PREDOMINANTLY PMN NO ORGANISMS SEEN    Culture   Final    NO GROWTH 3 DAYS Performed at [facility], [address] [place], [place] [address]    Report Status 12/25/2019 FINAL  Final  Aerobic/Anaerobic Culture (surgical/deep wound)     Status: None   Collection Time: 12/29/2019  3:06 PM   Specimen: PATH Cytology Pleural fluid; Body Fluid  Result Value Ref Range Status   Specimen Description PLEURAL  Final   Special Requests FLUID LEFT  Final   Gram Stain   Final    RARE WBC PRESENT, PREDOMINANTLY PMN NO ORGANISMS SEEN    Culture   Final    No growth aerobically or anaerobically. Performed at [facility], [place] [address] [place], [place] [address]    Report Status 12/26/2019 FINAL  Final   Culture, respiratory (non-expectorated)     Status: None   Collection Time: 12/22/19  8:27 AM   Specimen: Tracheal Aspirate; Respiratory  Result Value Ref Range Status   Specimen Description TRACHEAL ASPIRATE  Final   Special Requests Normal  Final   Gram Stain   Final    RARE WBC PRESENT, PREDOMINANTLY PMN NO ORGANISMS SEEN Performed at [facility], [place] [address], [place],  [address]    Culture RARE CANDIDA ALBICANS  Final   Report Status 12/25/2019 FINAL  Final    Coagulation Studies: No results for input(s): LABPROT, INR in the last 72 hours.  Urinalysis: No results for input(s): COLORURINE, LABSPEC, PHURINE, GLUCOSEU, HGBUR, BILIRUBINUR, KETONESUR, PROTEINUR, UROBILINOGEN, NITRITE, LEUKOCYTESUR in the last 72 hours.  Invalid input(s): APPERANCEUR    Imaging: DG Abd 1 View  Result Date: 12/31/2019 CLINICAL DATA:  Abdominal distension. EXAM: ABDOMEN - 1 VIEW COMPARISON:  Radiographs 12/03/2019 and 11/09/2019.  CT 01/14/2020. FINDINGS: 1808 hours. Two views are submitted. Feeding tube projects to the level of the mid stomach. Cardiac pacemaker leads are noted. The bowel gas pattern is nonobstructive. There is gas throughout the small and large bowel. No supine evidence of free intraperitoneal air. Calcification within the left upper quadrant of the abdomen corresponds with a chronic splenic calcification on CT. Small bilateral pleural effusions are noted. IMPRESSION: Nonobstructive bowel gas pattern. Feeding  tube tip in the mid stomach. Electronically Signed   By: Richardean Sale M.D.   On: 12/31/2019 18:45     Medications:   .  prismasol BGK 4/2.5 500 mL/hr at 01/01/20 1015  .  prismasol BGK 4/2.5 300 mL/hr at 01/01/20 0928  . sodium chloride Stopped (12/30/19 2139)  . amiodarone 30 mg/hr (01/02/20 0500)  . dexmedetomidine (PRECEDEX) IV infusion Stopped (12/31/19 1428)  . feeding supplement (VITAL AF 1.2 CAL) 60 mL/hr at 01/01/20 0700  . fentaNYL infusion  INTRAVENOUS Stopped (12/31/19 1440)  . levETIRAcetam Stopped (01/01/20 2135)  . norepinephrine (LEVOPHED) Adult infusion Stopped (12/28/19 0817)  . prismasol BGK 4/2.5 1,500 mL/hr at 01/01/20 1247   . amantadine  100 mg Per Tube BID  . aspirin  325 mg Per Tube Daily  . atorvastatin  40 mg Per Tube Daily  . B-complex with vitamin C  1 tablet Per Tube Daily  . chlorhexidine gluconate (MEDLINE KIT)  15 mL Mouth Rinse BID  . Chlorhexidine Gluconate Cloth  6 each Topical Q0600  . docusate  100 mg Per Tube BID  . feeding supplement (PRO-STAT SUGAR FREE 64)  30 mL Per Tube Daily  . insulin aspart  0-20 Units Subcutaneous Q4H  . insulin glargine  5 Units Subcutaneous BID  . mouth rinse  15 mL Mouth Rinse 10 times per day  . pantoprazole (PROTONIX) IV  40 mg Intravenous Q24H  . phenytoin (DILANTIN) IV  100 mg Intravenous Q8H  . polyethylene glycol  17 g Per Tube TID  . senna-docusate  1 tablet Per Tube BID  . sodium chloride flush  10-40 mL Intracatheter Q12H   sodium chloride, acetaminophen, bisacodyl, fentaNYL, heparin, hydrALAZINE, midazolam, ondansetron (ZOFRAN) IV, oxyCODONE, senna-docusate, sodium chloride flush  Assessment/ Plan:   Acute kidney injury, oliguric ischemic ATN secondary to hemorrhagic shock requiring pressor support.  Refractory to IV diuretics initiated on CRRT 12/26/2019 left IJ temporary dialysis catheter per CCM.  Negative fluid balance on CRRT 100-150 cc an hour.  ANEMIA-transfusion 5 units packed red blood cells 9 units fresh frozen plasma 2 units cryoprecipitate.  Last transfusion 12/29/2019  Chronic atrial fibrillation status post pacemaker anticoagulation on hold.  Rate controlled with amiodarone.  Ventilator dependent respiratory failure.  Tracheostomy planned 01/02/2020  Hemorrhagic shock secondary to hemothorax status post multiple transfusions 01/09/2020.    Metabolic acidosis appears to be controlled with CRRT  Hemorrhagic right MCA infarction seizure having  received thrombectomy complicated by brain edema and craniectomy 10/31/2019  Diabetes mellitus insulin sliding scale  Seizure prophylaxis continues with Dilantin and Keppra  Hypophosphatemia.  We will replace.  Active discussions with palliative medicine involved.  Family receptive to considering all outcomes.     LOS: [place] _0 _1 :36 AM

## 2020-01-02 NOTE — Progress Notes (Signed)
      [address]       [place] [address]             [phone]      12 Days Post-Op Procedure(s) (LRB): Video Assisted Thoracoscopy (Vats) left , Drainage of Hemothorax (Left) Video Bronchoscopy (N/A) Subjective: Intubated, no sedation. Does open eyes and move hands without purpose  Objective: Vital signs in last 24 hours: Temp:  [96.4 F (35.8 C)-98.6 F (37 C)] 98.6 F (37 C) (06/18 1400) Pulse Rate:  [59-65] 59 (06/18 1400) Cardiac Rhythm: Normal sinus rhythm;Ventricular paced (06/18 1200) Resp:  [23-36] 30 (06/18 1400) BP: (114-161)/(47-65) 132/54 (06/18 1400) SpO2:  [100 %] 100 % (06/18 1400) FiO2 (%):  [40 %] 40 % (06/18 1132) Weight:  [71.2 kg] 71.2 kg (06/18 0416)  Hemodynamic parameters for last 24 hours: CVP:  [20 mmHg] 20 mmHg  Intake/Output from previous day: 06/17 0701 - 06/18 0700 In: 2524.8 [I.V.:398.8; NG/GT:1900; IV Piggyback:216] Out: 5295 [Urine:30; Stool:420] Intake/Output this shift: Total I/O In: 766.9 [I.V.:83.4; NG/GT:400; IV Piggyback:283.6] Out: 1006 [Other:1006]  General appearance: alert, cooperative and no distress Heart: regular rate and rhythm, S1, S2 normal, no murmur, click, rub or gallop Lungs: coarse breath sounds Abdomen: soft, non-tender; bowel sounds normal; no masses,  no organomegaly Extremities: left-sided upper ext edema Wound: thoracotomy incision is healing well with staples in place  Lab Results: Recent Labs    01/01/20 0433 01/02/20 0426  WBC 24.7* 20.5*  HGB 8.2* 8.3*  HCT 27.0* 27.6*  PLT 121* 107*   BMET:  Recent Labs    01/01/20 1536 01/02/20 0426  NA 134* 134*  K 4.8 4.7  CL 100 100  CO2 25 25  GLUCOSE 205* 144*  BUN 20 20  CREATININE 1.14* 1.13*  CALCIUM 7.9* 7.8*    PT/INR: No results for input(s): LABPROT, INR in the last 72 hours. ABG    Component Value Date/Time   PHART 7.403 12/22/2019 0348   HCO3 22.8 12/22/2019 0348   TCO2 24 12/22/2019 0348   ACIDBASEDEF 2.0  12/22/2019 0348   O2SAT 99.0 12/22/2019 0348   CBG (last 3)  Recent Labs    01/02/20 0432 01/02/20 0803 01/02/20 1144  GLUCAP 141* 139* 169*    Assessment/Plan: S/P Procedure(s) (LRB): Video Assisted Thoracoscopy (Vats) left , Drainage of Hemothorax (Left) Video Bronchoscopy (N/A)  1. Minimum output from the chest tubes on the left side. Ordered a chest xray for tomorrow morning since she has not had one in a few days. 2. Lurline Idol suggested however family does not want to consent at this time  3. CRRT -per Nephrology. Her fluid balance has been negative for the last few days. Switching to HD soon?  4. Neuro has singed off with recs   Plan: Plan to continue left sided chest tubes for now. Will get a chest xray for tomorrow.    LOS: 19 days    Elgie Collard 01/02/2020

## 2020-01-02 NOTE — Progress Notes (Signed)
NAME:  Krystal Little, MRN:  [id number], DOB:  08/29/1955, LOS: 21 ADMISSION DATE:  12/04/2019, CONSULTATION DATE:  5/29 REFERRING MD:  Letta Pate, CHIEF COMPLAINT:  Chest congestion   Brief History   77 female with an extensive cardiac history was admitted to [facility] in April in the setting of a right MCA stroke, had a right interventional radiology guided thrombectomy, later had significant brain edema and underwent craniectomy, ultimately transferred to inpatient rehab.  Pulmonary and critical care medicine was consulted on May 29 in the setting of tachypnea and an enlarging left-sided pleural effusion. Thoracentesis was performed which showed a hemothorax.  A CT chest was then ordered showing obstruction of her left mainstem bronchus and a loculated left pleural effusion.  On May 30 she was moved to the ICU for chest tube placement and bronchoscopy.   Past Medical History  Status post mitral valve repair 2001 Status post cardiac pacemaker 2001 History of sick sinus syndrome Hypertension Diabetes mellitus type 2 Chronic atrial fibrillation Right MCA stroke 2021  Significant Hospital Events   4/16 IR thrombectomy R MCA stroke 4/25 PCCM sign off 5/6 transfer to [facility] 5/29 PCCM consult left pleural effusion, thoracentesis 5/30 worsening tachypnea. Move to ICU for intubation to facilitate bronchoscopy and chest tube placement 5/31 TPA/ pulmonzyme started for 3 days for persistent left effusion 6/01 Afebrile, low-dose Levophed, Precedex, Minimal output on chest tube- pulmozyme / tpa x 2,  6/02 Weaned x 7 hours yesterday PSV 15/5, pulmozyme and TPA completed 3 days 6/03 afib with RVR placed on amio gtt overnight , 1U PRBC for Hb 7.1  6/05 extubated successfully, Repeat TPA/Pulmonzyme given again  6/06 acute decompensation overnight, hemorrhagic shock > to OR for left VATS with 2L hemothorax drained.  CT head with new ICH> neurosurgery consulted 6/09 More awake but  agitated on PSV weaning  6/12: Seizures + on EEG   Consults:  PCCM  Procedures:  5/29 Thoracentesis >> 1.5 L bloody fluid 5/30 ETT >  5/30 L pigtail chest tube >  Bronchoscopy 5/30 >>  large, thick, grey mucus plug completely occluding the left mainstem bronchus.  [facility] HD cath 6/12 >  Significant Diagnostic Tests:  Thoracentesis left chest 5/29 >> 8K WBC, 78% WBC, protein 4.0, LDH 182 CT chest abdomen pelvis 6/6 >> large left hemithorax, patchy right lung opacities with small right effusion.  No acute abdominal process CT head 6/6 >> evolution of right MCA infarct with hemorrhagic transformation with large intraparenchymal hematoma, 2 additional small hemorrhages in the inferior cerebellum and left temporal lobe.  Trace subarachnoid blood. CT Head 6/10 >> stable hemorrhagic R MCA.  Right hemi-craniectomy with no midline shift or mass effect.  Mildly regressed small cerebellar hemorrhages.  Stable small volume intraventricular hemorrhage, no ventriculomegaly.  Trace subarachnoid hemorrhage largely resolved.  6/12 EEG: right frontocentral seizures X2   Micro Data:   RVP 4/16 neg  MRSA PCR  4/17 neg   ======== 5/29 Left pleural fluid culture > negative 5/29 left pleural fluid cytology >> No malignant cells identified. Blood and acute inflammation.  5/30 BAL >> rare candida albicans 6/06 L Pleural Fluid >> negative 6/06 BCx2 >> negative   Antimicrobials:  Cefepime 4/24 >> 4/25 Zosyn 4/28 >> 5/4 Zosyn 5/30 >> 6/5 Cefepime 6/6 >> 6/12  Interim history/subjective:   Vertically ill, intubated on mechanical ventilator support, CVVHD in place   Objective   Blood pressure (!) 157/62, pulse 64, temperature 97.7 F (36.5 C), resp. rate Marland Kitchen)  23, height _0  (1.626 m), weight 71.2 kg, SpO2 100 %. CVP:  [20 mmHg] 20 mmHg  Vent Mode: PRVC FiO2 (%):  [40 %] 40 % Set Rate:  [20 bmp] 20 bmp Vt Set:  [430 mL] 430 mL PEEP:  [5 cmH20] 5 cmH20 Plateau Pressure:  [18 cmH20-24 cmH20] 24 cmH20    Intake/Output Summary (Last 24 hours) at 01/02/2020 1007 Last data filed at 01/02/2020 1000 Gross per 24 hour  Intake 2291 ml  Output 5112 ml  Net -2821 ml   Filed Weights   12/31/19 0335 01/01/20 0500 01/02/20 0416  Weight: 73.2 kg 73.8 kg 71.2 kg    Examination:  General: Elderly female chronically ill on mechanical life support HEENT: Status post right-sided hemicraniectomy Neuro: Sedate, occasionally following commands with the right arm. CV: Regular rate rhythm, S1-S2 PULM: Bilateral ventilated breath sounds GI:, Nontender nondistended Extremities: Bilateral edema dependent  Assessment & Plan:   Status Epilepticus, likely related to underlying stroke history :EEG off 6/14 -AEDs per neuro, as needed Ativan for seizure-like activity  Acute Respiratory Failure with Hypoxemia Mucus Plugging, Suspected Aspiration   Left Pleural Effusion s/p Thoracentesis, L Chest Tube  Right pleural effusion, worsening -Adult mechanical ventilator protocol -Mental status precludes liberation at this time -Likely needs tracheostomy however family will not consent at this time.  Until this is "last resort". -I do believe we need some input from nephrology whether or not she is a candidate for IHD due to her current clinical status and multiple comorbidities.  AKI secondary to shock - no evidence of obstruction on renal US. Hypernatremia. 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  Evaluted by neurosurgery who felt that CTs were stable and no surgical evacuation or EVD was warranted. Neurosurgery with no plans for intervention at this time -Protection of the right hemicraniectomy site  DM2 with Hyperglycemia -CBG, SSI plus Lantus  Chronic Atrial Fibrillation s/p Pacemaker -Telemetry -Anticoagulation held due to bleeding  Moderate Protein Calorie Malnutrition  -Continue tube  feeding  Goals of Care P: -Full code  Had a long discussion on afternoon of 01/01/2020 with patient's husband.  At this time would like to continue to do everything possible.  He has only excepted outcome from her hospitalization is "for her to walk out of here".  However after discussing pros and cons of mechanical ventilation he is not willing to consent for tracheostomy tube at this time until it is "the last resort".  I do believe we need to discuss candidacy for prolonged hemodialysis in the setting of a person that may require prolonged mechanical vent support.  We will need help from our nephrology colleagues regarding this.  Resolved: - Hemorrhagic shock secondary to hemothorax  Best practice:  Diet: TF  Pain/Anxiety/Delirium protocol (if indicated): Off all continuous sedation at this time VAP protocol (if indicated): yes DVT prophylaxis: SCDs GI prophylaxis: PPI Glucose control: SSI Mobility: BR Code Status: full code Family Communication: Long discussion with patient's husband yesterday afternoon Disposition:  ICU  This patient is critically ill with multiple organ system failure; which, requires frequent high complexity decision making, assessment, support, evaluation, and titration of therapies. This was completed through the application of advanced monitoring technologies and extensive interpretation of multiple databases. During this encounter critical care time was devoted to patient care services described in this note for 32 minutes.  Garner Nash, DO [facility]  Critical [facility] 01/02/2020 10:07 AM

## 2020-01-03 ENCOUNTER — Inpatient Hospital Stay (HOSPITAL_COMMUNITY): Payer: Medicare Other

## 2020-01-03 DIAGNOSIS — R569 Unspecified convulsions: Secondary | ICD-10-CM

## 2020-01-03 LAB — GLUCOSE, CAPILLARY
Glucose-Capillary: 170 mg/dL — ABNORMAL HIGH (ref 70–99)
Glucose-Capillary: 164 mg/dL — ABNORMAL HIGH (ref 70–99)
Glucose-Capillary: 139 mg/dL — ABNORMAL HIGH (ref 70–99)
Glucose-Capillary: 172 mg/dL — ABNORMAL HIGH (ref 70–99)
Glucose-Capillary: 97 mg/dL (ref 70–99)
Glucose-Capillary: 135 mg/dL — ABNORMAL HIGH (ref 70–99)

## 2020-01-03 LAB — RENAL FUNCTION PANEL
Albumin: 1.6 g/dL — ABNORMAL LOW (ref 3.5–5.0)
Albumin: 1.6 g/dL — ABNORMAL LOW (ref 3.5–5.0)
Anion gap: 7 (ref 5–15)
Anion gap: 7 (ref 5–15)
BUN: 20 mg/dL (ref 8–23)
BUN: 22 mg/dL (ref 8–23)
CO2: 25 mmol/L (ref 22–32)
CO2: 26 mmol/L (ref 22–32)
Calcium: 7.3 mg/dL — ABNORMAL LOW (ref 8.9–10.3)
Calcium: 7.4 mg/dL — ABNORMAL LOW (ref 8.9–10.3)
Chloride: 100 mmol/L (ref 98–111)
Chloride: 101 mmol/L (ref 98–111)
Creatinine, Ser: 1.02 mg/dL — ABNORMAL HIGH (ref 0.44–1.00)
Creatinine, Ser: 1.04 mg/dL — ABNORMAL HIGH (ref 0.44–1.00)
GFR calc Af Amer: >60 mL/min (ref >60)
GFR calc Af Amer: >60 mL/min (ref >60)
GFR calc non Af Amer: 58 mL/min — ABNORMAL LOW (ref >60)
GFR calc non Af Amer: 56 mL/min — ABNORMAL LOW (ref >60)
Glucose, Bld: 180 mg/dL — ABNORMAL HIGH (ref 70–99)
Glucose, Bld: 110 mg/dL — ABNORMAL HIGH (ref 70–99)
Phosphorus: 1.7 mg/dL — ABNORMAL LOW (ref 2.5–4.6)
Phosphorus: 3.7 mg/dL (ref 2.5–4.6)
Potassium: 4.4 mmol/L (ref 3.5–5.1)
Potassium: 4.8 mmol/L (ref 3.5–5.1)
Sodium: 132 mmol/L — ABNORMAL LOW (ref 135–145)
Sodium: 134 mmol/L — ABNORMAL LOW (ref 135–145)

## 2020-01-03 LAB — CBC
HCT: 27.2 % — ABNORMAL LOW (ref 36.0–46.0)
Hemoglobin: 8.2 g/dL — ABNORMAL LOW (ref 12.0–15.0)
MCH: 30.6 pg (ref 26.0–34.0)
MCHC: 30.1 g/dL (ref 30.0–36.0)
MCV: 101.5 fL — ABNORMAL HIGH (ref 80.0–100.0)
Platelets: 79 10*3/uL — ABNORMAL LOW (ref 150–400)
RBC: 2.68 MIL/uL — ABNORMAL LOW (ref 3.87–5.11)
RDW: 23.8 % — ABNORMAL HIGH (ref 11.5–15.5)
WBC: 15.4 10*3/uL — ABNORMAL HIGH (ref 4.0–10.5)
nRBC: 0.1 % (ref 0.0–0.2)

## 2020-01-03 LAB — MAGNESIUM: Magnesium: 2.4 mg/dL (ref 1.7–2.4)

## 2020-01-03 MED ORDER — PANTOPRAZOLE SODIUM 40 MG PO PACK
40.0000 mg | PACK | Freq: Every day | ORAL | Status: DC
  Administered 2020-01-03 – 2020-01-31 (×29): 40 mg
  Filled 2020-01-03 (×30): qty 20

## 2020-01-03 MED ORDER — AMIODARONE HCL 200 MG PO TABS
200.0000 mg | ORAL_TABLET | Freq: Every day | ORAL | Status: DC
  Administered 2020-01-03: 200 mg via ORAL
  Filled 2020-01-03: qty 1

## 2020-01-03 MED ORDER — AMIODARONE HCL 200 MG PO TABS
200.0000 mg | ORAL_TABLET | Freq: Every day | ORAL | Status: DC
  Administered 2020-01-04 – 2020-02-01 (×28): 200 mg
  Filled 2020-01-03 (×29): qty 1

## 2020-01-03 MED ORDER — SODIUM PHOSPHATES 45 MMOLE/15ML IV SOLN
30.0000 mmol | Freq: Once | INTRAVENOUS | Status: AC
  Administered 2020-01-03: 30 mmol via INTRAVENOUS
  Filled 2020-01-03: qty 10

## 2020-01-03 NOTE — Progress Notes (Signed)
NAME:  Krystal Little, MRN:  [id number], DOB:  April 11, 1956, LOS: 9 ADMISSION DATE:  11/22/2019, CONSULTATION DATE:  5/29 REFERRING MD:  Letta Pate, CHIEF COMPLAINT:  Chest congestion   Brief History   37 female with an extensive cardiac history was admitted to [facility] in April in the setting of a right MCA stroke, had a right interventional radiology guided thrombectomy, later had significant brain edema and underwent craniectomy, ultimately transferred to inpatient rehab.  Pulmonary and critical care medicine was consulted on May 29 in the setting of tachypnea and an enlarging left-sided pleural effusion. Thoracentesis was performed which showed a hemothorax.  A CT chest was then ordered showing obstruction of her left mainstem bronchus and a loculated left pleural effusion.  On May 30 she was moved to the ICU for chest tube placement and bronchoscopy.  Underwent intrapleural TPA but required VATS eventually, course further complicated by ICH , seizures and AKI requiring CRRT  Past Medical History  Status post mitral valve repair 2001 Status post cardiac pacemaker 2001 History of sick sinus syndrome Hypertension Diabetes mellitus type 2 Chronic atrial fibrillation Right MCA stroke 2021  Significant Hospital Events   4/16 IR thrombectomy R MCA stroke 4/25 PCCM sign off 5/6 transfer to [facility] 5/29 PCCM consult left pleural effusion, thoracentesis 5/30 worsening tachypnea. Move to ICU for intubation to facilitate bronchoscopy and chest tube placement 5/31 TPA/ pulmonzyme started for 3 days for persistent left effusion 6/01 Afebrile, low-dose Levophed, Precedex, Minimal output on chest tube- pulmozyme / tpa x 2,  6/02 Weaned x 7 hours yesterday PSV 15/5, pulmozyme and TPA completed 3 days 6/03 afib with RVR placed on amio gtt overnight , 1U PRBC for Hb 7.1  6/05 extubated successfully, Repeat TPA/Pulmonzyme given again  6/06 acute decompensation overnight,  hemorrhagic shock > to OR for left VATS with 2L hemothorax drained.  CT head with new ICH> neurosurgery consulted 6/09 More awake but agitated on PSV weaning  6/12: Seizures + on EEG   Consults:  PCCM  Procedures:  5/29 Thoracentesis >> 1.5 L bloody fluid 5/30 ETT >  5/30 L pigtail chest tube >  Bronchoscopy 5/30 >>  large, thick, grey mucus plug completely occluding the left mainstem bronchus.  [facility] HD cath 6/12 >  Significant Diagnostic Tests:  Thoracentesis left chest 5/29 >> 8K WBC, 78% WBC, protein 4.0, LDH 182 CT chest abdomen pelvis 6/6 >> large left hemithorax, patchy right lung opacities with small right effusion.  No acute abdominal process CT head 6/6 >> evolution of right MCA infarct with hemorrhagic transformation with large intraparenchymal hematoma, 2 additional small hemorrhages in the inferior cerebellum and left temporal lobe.  Trace subarachnoid blood. CT Head 6/10 >> stable hemorrhagic R MCA.  Right hemi-craniectomy with no midline shift or mass effect.  Mildly regressed small cerebellar hemorrhages.  Stable small volume intraventricular hemorrhage, no ventriculomegaly.  Trace subarachnoid hemorrhage largely resolved.  6/12 EEG: right frontocentral seizures X2   Micro Data:   RVP 4/16 neg  MRSA PCR  4/17 neg   ======== 5/29 Left pleural fluid culture > negative 5/29 left pleural fluid cytology >> No malignant cells identified. Blood and acute inflammation.  5/30 BAL >> rare candida albicans 6/06 L Pleural Fluid >> negative 6/06 BCx2 >> negative   Antimicrobials:  Cefepime 4/24 >> 4/25 Zosyn 4/28 >> 5/4 Zosyn 5/30 >> 6/5 Cefepime 6/6 >> 6/12  Interim history/subjective:   Remains critically ill, intubated On CRRT On amiodarone drip, paced  rhythm on monitor Afebrile , anuric  Objective   Blood pressure 138/64, pulse 63, temperature 97.6 F (36.4 C), resp. rate (!) 31, height _0  (1.626 m), weight 65.1 kg, SpO2 100 %. CVP:  [8 mmHg-20 mmHg] 20 mmHg    Vent Mode: PRVC FiO2 (%):  [40 %] 40 % Set Rate:  [20 bmp] 20 bmp Vt Set:  [430 mL] 430 mL PEEP:  [5 cmH20] 5 cmH20 Plateau Pressure:  [16 cmH20-23 cmH20] 17 cmH20   Intake/Output Summary (Last 24 hours) at 01/03/2020 0830 Last data filed at 01/03/2020 0800 Gross per 24 hour  Intake 2568.46 ml  Output 6532 ml  Net -3963.54 ml   Filed Weights   01/01/20 0500 01/02/20 0416 01/03/20 0530  Weight: 73.8 kg 71.2 kg 65.1 kg    Examination:  General: Elderly female chronically ill on vent, no distress HEENT: Status post right-sided hemicraniectomy Neuro: Sedate, occasionally following commands with the right arm, left hemiplegia CV: Regular rate rhythm, S1-S2 PULM: Bilateral ventilated breath sounds , minimal secretions, no accessory muscle use , chest tubes with minimal sanguinous drainage GI:, Nontender nondistended Extremities: Bilateral edema dependent   Chest x-ray 6/19 personally reviewed continues to show loculated left effusion status post chest tubes x2, improved right lower lobe infiltrate  Labs show low phosphorus otherwise normalized creatinine, decreasing leukocytosis, stable anemia and thrombocytopenia  Assessment & Plan:   Acute Respiratory Failure with Hypoxemia Mucus Plugging, Suspected Aspiration   Left Pleural Effusion s/p Thoracentesis, L Chest Tube  Right pleural effusion, worsening -Attempt spontaneous breathing trials but with fluctuant mental status and significant comorbidities, extubation would be suboptimal at best -Ideally needs tracheostomy however family will not consent at this time.  Until this is "last resort".   AKI secondary to shock - no evidence of obstruction on renal US. Hypernatremia. 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  Evaluted by neurosurgery who felt that CTs were stable and no surgical evacuation or EVD was warranted. -Supportive care  Status Epilepticus, likely related to underlying stroke history :EEG off 6/14 -Keppra and Dilantin per neuro, as needed Ativan for seizure-like activity  DM2 with Hyperglycemia -CBG, SSI plus Lantus  Chronic Atrial Fibrillation s/p Pacemaker -Telemetry -Anticoagulation held due to bleeding -Change amiodarone to oral, resume Cardizem at some point  Moderate Protein Calorie Malnutrition  -Continue tube feeding  Goals of Care P: -Full code  Resolved: - Hemorrhagic shock secondary to hemothorax   Summary -ideally needs tracheostomy if we are to push forward aggressively .  Husband seems focused on what caused mucus buildup on rehab floor.  IN palliative care provider have discussed this with him numerous times. Prognosis for neurologic recovery is guarded due to rocky hospital course Per discussion with husband in 6/17 , the only outcome he will accept is  "for her to walk out of here" -seems unrealistic at this point  Best practice:  Diet: TF  Pain/Anxiety/Delirium protocol (if indicated): fent prn VAP protocol (if indicated): yes DVT prophylaxis: SCDs GI prophylaxis: PPI Glucose control: SSI Mobility: BR Code Status: full code Family Communication: Husband and daughter Disposition:  ICU  The patient is critically ill with multiple organ systems failure and requires high complexity decision making for assessment and support, frequent evaluation and titration of therapies, application of  advanced monitoring technologies and extensive interpretation of multiple databases. Critical Care Time devoted to patient care services described in this note independent of APP/resident  time is 35 minutes.    Kara Mead MD. [facility]. [facility]  If no response to pager , please call [phone]   01/03/2020

## 2020-01-03 NOTE — Progress Notes (Signed)
[facility] ROUNDING NOTE   Subjective:   This is a 64 year old lady with extensive cardiac history including mitral valve repair 2001 and pacemaker placement 2001 with a history of sick sinus syndrome.  She also has a history of chronic atrial fibrillation, and diabetes.  She was admitted to [facility] in April 2021 with right MCA stroke right interventional guided thrombectomy 10/31/2019 significant brain edema status post craniotomy after TPA.  During her stay in rehab she developed respiratory distress with an enlarging pleural effusion underwent thoracentesis.  She was subsequently intubated and developed hemorrhagic shock.  She required VATS procedure 01/08/2020.  This was complicated by acute kidney injury and chronic renal replacement therapy was initiated 12/26/2019.  No heparin was used secondary to hemorrhagic shock.  She continues on CRRT.  Very poor urine output noted.  Removal of left anterior chest tube 12/30/2019.  Appreciate assistance of Dr. Prescott Gum.  .  Conversations with family continue.  Family will not consent to tracheostomy.  Expectations of family appear to be unrealistic at this time.  If she tolerates hemodialysis likely outcome is prolonged stay at [facility].  We can see about transitioning her to intermittent hemodialysis next week.  Blood pressure 141/56 pulse 60 temperature 97.7 O2 sats 100% FiO2 40%  Ultrafiltration decreased to 50 cc an hour  Sodium 132 potassium 4.4 chloride 100 CO2 25 BUN 20 creatinine 1.02 glucose 180 calcium 7.3 phosphorus 1.7 magnesium 2.4 albumin 1.6.  Hemoglobin 8.2 WBC 15.4 platelets 79  IV amiodarone IV Keppra 750 mg every 12 hours  Amantadine 100 mg twice daily, aspirin 325 mg day Lipitor 40 mg daily vitamin B complex 1/day, insulin sliding scale, Lantus 10 units daily, Protonix 40 mg daily Dilantin 100 mg every 8 hours   Objective:  Vital signs in last 24 hours:  Temp:  [97.2 F (36.2 C)-98.6 F (37 C)] 97.7 F  (36.5 C) (06/19 0400) Pulse Rate:  [59-67] 59 (06/19 0600) Resp:  [23-36] 26 (06/19 0600) BP: (110-157)/(46-62) 135/55 (06/19 0600) SpO2:  [100 %] 100 % (06/19 0600) FiO2 (%):  [40 %] 40 % (06/19 0400) Weight:  [65.1 kg] (P) 65.1 kg (06/19 0530)  Weight change:  Filed Weights   01/01/20 0500 01/02/20 0416 01/03/20 0530  Weight: 73.8 kg 71.2 kg (P) 65.1 kg    Intake/Output: I/O last 3 completed shifts: In: 4010.4 [I.V.:598.7; Other:115; NG/GT:2720; IV Piggyback:576.7] Out: 2440 [Urine:100; NUUVO:5366; Stool:820]   Intake/Output this shift:  Total I/O In: 4009.7 [I.V.:181.7; Other:3000; NG/GT:720; IV Piggyback:108] Out: 4403 [Urine:3100; KVQQV:9563; Stool:50]  General: Critically ill looking female intubated, sedated, not responding Heart:RRR, s1s2 nl, no rubs Lungs: Coarse breath sound bilateral Abdomen:soft, Non-tender, non-distended Extremities: Bilateral lower extremity edema present Dialysis Access: Left IJ temporary HD catheter, site looks clean Neurology: Sedated.   Basic Metabolic Panel: Recent Labs  Lab 12/30/19 0445 12/30/19 1617 12/31/19 0347 12/31/19 1614 01/01/20 0433 01/01/20 0433 01/01/20 1536 01/01/20 1536 01/02/20 0426 01/02/20 1535 01/03/20 0359  NA 134*   < > 134*   < > 133*  --  134*  --  134* 135 132*  K 4.2   < > 4.9   < > 5.1  --  4.8  --  4.7 4.6 4.4  CL 100   < > 102   < > 99  --  100  --  100 101 100  CO2 25   < > 24   < > 24  --  25  --  25  26 25  GLUCOSE 167*   < > 151*   < > 210*  --  205*  --  144* 169* 180*  BUN 14   < > 15   < > 19  --  20  --  _0 CREATININE 1.21*   < > 1.15*   < > 1.18*  --  1.14*  --  1.13* 1.06* 1.02*  CALCIUM 7.9*   < > 7.8*   < > 7.8*   < > 7.9*   < > 7.8* 7.4* 7.3*  MG 2.3  --  2.4  --  2.6*  --   --   --  2.4  --  2.4  PHOS 1.8*   < > 2.3*   < > 2.9  --  2.4*  --  2.1* 2.7 1.7*   < > = values in this interval not displayed.    Liver Function Tests: Recent Labs  Lab 01/01/20 0433 01/01/20 1536  01/02/20 0426 01/02/20 1535 01/03/20 0359  ALBUMIN 1.8* 1.7* 1.7* 1.7* 1.6*   No results for input(s): LIPASE, AMYLASE in the last 168 hours. No results for input(s): AMMONIA in the last 168 hours.  CBC: Recent Labs  Lab 12/31/19 0347 01/01/20 0433 01/02/20 0426 01/02/20 1535 01/03/20 0359  WBC 21.4* 24.7* 20.5* 17.9* 15.4*  HGB 8.5* 8.2* 8.3* 8.4* 8.2*  HCT 28.2* 27.0* 27.6* 28.0* 27.2*  MCV 98.9 100.4* 100.4* 101.4* 101.5*  PLT 130* 121* 107* 92* 79*    Cardiac Enzymes: No results for input(s): CKTOTAL, CKMB, CKMBINDEX, TROPONINI in the last 168 hours.  BNP: Invalid input(s): POCBNP  CBG: Recent Labs  Lab 01/02/20 1144 01/02/20 1531 01/02/20 2045 01/03/20 0114 01/03/20 0357  GLUCAP 169* 163* 168* 170* 164*    Microbiology: Results for orders placed or performed during the hospital encounter of 12/12/2019  MRSA PCR Screening     Status: None   Collection Time: 12/08/2019 11:00 AM   Specimen: Nasopharyngeal  Result Value Ref Range Status   MRSA by PCR NEGATIVE NEGATIVE Final    Comment:        The GeneXpert MRSA Assay (FDA approved for NASAL specimens only), is one component of a comprehensive MRSA colonization surveillance program. It is not intended to diagnose MRSA infection nor to guide or monitor treatment for MRSA infections. Performed at [facility], [place] [address] [place], [place] [address]   Culture, bal-quantitative     Status: Abnormal   Collection Time: 11/30/2019 11:37 AM   Specimen: Bronchoalveolar Lavage  Result Value Ref Range Status   Specimen Description BRONCHIAL ALVEOLAR LAVAGE  Final   Special Requests NONE  Final   Gram Stain   Final    FEW WBC PRESENT,BOTH PMN AND MONONUCLEAR NO ORGANISMS SEEN    Culture (A)  Final    1,000 COLONIES/mL Consistent with normal respiratory flora. Performed at [facility], [place] [address], [place], [place] [address]    Report Status 12/16/2019 FINAL  Final  Culture, blood (routine x  2)     Status: None   Collection Time: 01/03/2020  6:11 AM   Specimen: BLOOD  Result Value Ref Range Status   Specimen Description BLOOD RIGHT HAND  Final   Special Requests   Final    BOTTLES DRAWN AEROBIC ONLY Blood Culture results may not be optimal due to an inadequate volume of blood received in culture bottles   Culture   Final    NO GROWTH  5 DAYS Performed at [facility], [place] [address] [place], [place] [address]    Report Status 12/26/2019 FINAL  Final  Culture, blood (routine x 2)     Status: None   Collection Time: 12/23/2019  6:16 AM   Specimen: BLOOD  Result Value Ref Range Status   Specimen Description BLOOD RIGHT ARM  Final   Special Requests   Final    BOTTLES DRAWN AEROBIC ONLY Blood Culture results may not be optimal due to an inadequate volume of blood received in culture bottles   Culture   Final    NO GROWTH 5 DAYS Performed at [facility], [place] [address] [place], [place] [address]    Report Status 12/26/2019 FINAL  Final  Body fluid culture     Status: None   Collection Time: 01/04/2020  3:06 PM   Specimen: PATH Cytology Pleural fluid; Body Fluid  Result Value Ref Range Status   Specimen Description PLEURAL FLUID  Final   Special Requests LEFT A  Final   Gram Stain   Final    MODERATE WBC PRESENT, PREDOMINANTLY PMN NO ORGANISMS SEEN    Culture   Final    NO GROWTH 3 DAYS Performed at [facility], [address] [place], [place] [address]    Report Status 12/25/2019 FINAL  Final  Aerobic/Anaerobic Culture (surgical/deep wound)     Status: None   Collection Time: 01/05/2020  3:06 PM   Specimen: PATH Cytology Pleural fluid; Body Fluid  Result Value Ref Range Status   Specimen Description PLEURAL  Final   Special Requests FLUID LEFT  Final   Gram Stain   Final    RARE WBC PRESENT, PREDOMINANTLY PMN NO ORGANISMS SEEN    Culture   Final    No growth aerobically or anaerobically. Performed at [facility], [place] [address]  [place], [place] [address]    Report Status 12/26/2019 FINAL  Final  Culture, respiratory (non-expectorated)     Status: None   Collection Time: 12/22/19  8:27 AM   Specimen: Tracheal Aspirate; Respiratory  Result Value Ref Range Status   Specimen Description TRACHEAL ASPIRATE  Final   Special Requests Normal  Final   Gram Stain   Final    RARE WBC PRESENT, PREDOMINANTLY PMN NO ORGANISMS SEEN Performed at [facility], [place] [address], [place], [place] [address]    Culture RARE CANDIDA ALBICANS  Final   Report Status 12/25/2019 FINAL  Final    Coagulation Studies: No results for input(s): LABPROT, INR in the last 72 hours.  Urinalysis: No results for input(s): COLORURINE, LABSPEC, PHURINE, GLUCOSEU, HGBUR, BILIRUBINUR, KETONESUR, PROTEINUR, UROBILINOGEN, NITRITE, LEUKOCYTESUR in the last 72 hours.  Invalid input(s): APPERANCEUR    Imaging: No results found.   Medications:   .  prismasol BGK 4/2.5 500 mL/hr at 01/03/20 0319  .  prismasol BGK 4/2.5 300 mL/hr at 01/02/20 2039  . sodium chloride Stopped (12/30/19 2139)  . amiodarone 30 mg/hr (01/03/20 0600)  . dexmedetomidine (PRECEDEX) IV infusion Stopped (12/31/19 1428)  . feeding supplement (VITAL AF 1.2 CAL) 60 mL/hr at 01/01/20 0700  . levETIRAcetam Stopped (01/02/20 2138)  . norepinephrine (LEVOPHED) Adult infusion Stopped (12/28/19 0817)  . prismasol BGK 4/2.5 1,500 mL/hr at 01/03/20 0622  . sodium chloride irrigation     . amantadine  100 mg Per Tube BID  . aspirin  325 mg Per Tube Daily  . atorvastatin  40 mg Per  Tube Daily  . B-complex with vitamin C  1 tablet Per Tube Daily  . chlorhexidine gluconate (MEDLINE KIT)  15 mL Mouth Rinse BID  . Chlorhexidine Gluconate Cloth  6 each Topical Q0600  . docusate  100 mg Per Tube BID  . feeding supplement (PRO-STAT SUGAR FREE 64)  30 mL Per Tube Daily  . insulin aspart  0-20 Units Subcutaneous Q4H  . insulin glargine  5 Units Subcutaneous BID  . mouth rinse  15 mL  Mouth Rinse 10 times per day  . pantoprazole (PROTONIX) IV  40 mg Intravenous Q24H  . phenytoin (DILANTIN) IV  100 mg Intravenous Q8H  . polyethylene glycol  17 g Per Tube TID  . senna-docusate  1 tablet Per Tube BID  . sodium chloride flush  10-40 mL Intracatheter Q12H   sodium chloride, acetaminophen, bisacodyl, fentaNYL (SUBLIMAZE) injection, heparin, hydrALAZINE, midazolam, ondansetron (ZOFRAN) IV, oxyCODONE, senna-docusate, sodium chloride flush  Assessment/ Plan:   Acute kidney injury, oliguric ischemic ATN secondary to hemorrhagic shock requiring pressor support.  Refractory to IV diuretics initiated on CRRT 12/26/2019 left IJ temporary dialysis catheter per CCM.  Negative fluid balance on CRRT 50 cc an hour.  We will continue CRRT over the weekend and will plan to transition to hemodialysis 01/05/2020  ANEMIA-transfusion 5 units packed red blood cells 9 units fresh frozen plasma 2 units cryoprecipitate.  Last transfusion 12/25/2019  Chronic atrial fibrillation status post pacemaker anticoagulation on hold.  Rate controlled with amiodarone.  Ventilator dependent respiratory failure.  Tracheostomy planned 01/02/2020  Hemorrhagic shock secondary to hemothorax status post multiple transfusions 01/10/2020.    Metabolic acidosis appears to be controlled with CRRT  Hemorrhagic right MCA infarction seizure having received thrombectomy complicated by brain edema and craniectomy 10/31/2019  Diabetes mellitus insulin sliding scale  Seizure prophylaxis continues with Dilantin and Keppra  Hypophosphatemia.  We will replace.  Active discussions with palliative medicine involved.  Family receptive to considering all outcomes.  Appeared to have unrealistic expectations of recovery.     LOS: [place] _0 _1 :34 AM

## 2020-01-04 LAB — CBC
HCT: 27.7 % — ABNORMAL LOW (ref 36.0–46.0)
Hemoglobin: 8.3 g/dL — ABNORMAL LOW (ref 12.0–15.0)
MCH: 30.2 pg (ref 26.0–34.0)
MCHC: 30 g/dL (ref 30.0–36.0)
MCV: 100.7 fL — ABNORMAL HIGH (ref 80.0–100.0)
Platelets: 67 10*3/uL — ABNORMAL LOW (ref 150–400)
RBC: 2.75 MIL/uL — ABNORMAL LOW (ref 3.87–5.11)
RDW: 23.9 % — ABNORMAL HIGH (ref 11.5–15.5)
WBC: 10.8 10*3/uL — ABNORMAL HIGH (ref 4.0–10.5)
nRBC: 0 % (ref 0.0–0.2)

## 2020-01-04 LAB — GLUCOSE, CAPILLARY
Glucose-Capillary: 113 mg/dL — ABNORMAL HIGH (ref 70–99)
Glucose-Capillary: 125 mg/dL — ABNORMAL HIGH (ref 70–99)
Glucose-Capillary: 125 mg/dL — ABNORMAL HIGH (ref 70–99)
Glucose-Capillary: 131 mg/dL — ABNORMAL HIGH (ref 70–99)
Glucose-Capillary: 131 mg/dL — ABNORMAL HIGH (ref 70–99)
Glucose-Capillary: 141 mg/dL — ABNORMAL HIGH (ref 70–99)
Glucose-Capillary: 164 mg/dL — ABNORMAL HIGH (ref 70–99)

## 2020-01-04 LAB — RENAL FUNCTION PANEL
Albumin: 1.6 g/dL — ABNORMAL LOW (ref 3.5–5.0)
Anion gap: 7 (ref 5–15)
BUN: 21 mg/dL (ref 8–23)
CO2: 26 mmol/L (ref 22–32)
Calcium: 7.4 mg/dL — ABNORMAL LOW (ref 8.9–10.3)
Chloride: 99 mmol/L (ref 98–111)
Creatinine, Ser: 0.94 mg/dL (ref 0.44–1.00)
GFR calc Af Amer: >60 mL/min (ref >60)
GFR calc non Af Amer: >60 mL/min (ref >60)
Glucose, Bld: 125 mg/dL — ABNORMAL HIGH (ref 70–99)
Phosphorus: 2 mg/dL — ABNORMAL LOW (ref 2.5–4.6)
Potassium: 4.5 mmol/L (ref 3.5–5.1)
Sodium: 132 mmol/L — ABNORMAL LOW (ref 135–145)

## 2020-01-04 LAB — MAGNESIUM: Magnesium: 2.4 mg/dL (ref 1.7–2.4)

## 2020-01-04 NOTE — Progress Notes (Addendum)
[facility] ROUNDING NOTE   Subjective:   This is a 64 year old lady with extensive cardiac history including mitral valve repair 2001 and pacemaker placement 2001 with a history of sick sinus syndrome.  She also has a history of chronic atrial fibrillation, and diabetes.  She was admitted to [facility] in April 2021 with right MCA stroke right interventional guided thrombectomy 10/31/2019 significant brain edema status post craniotomy after TPA.  During her stay in rehab she developed respiratory distress with an enlarging pleural effusion underwent thoracentesis.  She was subsequently intubated and developed hemorrhagic shock.  She required VATS procedure 01/03/2020.  This was complicated by acute kidney injury and chronic renal replacement therapy was initiated 12/26/2019 - 01/04/20. Urine output appears to be improving   .baseline creatinine 0.58 mg/dl  Removal of left anterior chest tube 12/30/2019.  Appreciate assistance of Dr. Prescott Gum.  .  Conversations with family continue.  Family will not consent to tracheostomy.  Expectations of family appear to be unrealistic at this time.  Looks like renal function may be recovering   Will follow off CRRT  Blood pressure 110/53 pulse 59 temperature 96.1 O2 sats 90% FiO2 30%   Urine output 600cc  Ultrafiltration 150 cc an hour chest x-ray shows improving hazy opacifications over the mid to lower lungs 01/03/2020  Sodium 132 potassium 4.5 chloride 92 CO2 26 BUN 21 creatinine 0.94 glucose 125 calcium 7.4 phosphorus 2 magnesium 2.4 hemoglobin 8.3    Amantadine 100 mg twice daily, aspirin 325 mg day, amiodarone 200 mg daily Lipitor 40 mg daily vitamin B complex 1/day, insulin sliding scale, Lantus 5 units daily, Protonix 40 mg daily Dilantin 100 mg every 8 hours  IV Keppra 750 mg every 12 hours  Objective:  Vital signs in last 24 hours:  Temp:  [94.5 F (34.7 C)-97.6 F (36.4 C)] 96.1 F (35.6 C) (06/20 0400) Pulse Rate:  [39-77]  59 (06/20 0400) Resp:  [24-35] 30 (06/20 0400) BP: (110-151)/(49-85) 115/54 (06/20 0400) SpO2:  [91 %-100 %] 100 % (06/20 0400) FiO2 (%):  [30 %-40 %] 30 % (06/20 0335) Weight:  [61.5 kg] 61.5 kg (06/20 0421)  Weight change: -3.6 kg Filed Weights   01/02/20 0416 01/03/20 0530 01/04/20 0421  Weight: 71.2 kg 65.1 kg 61.5 kg    Intake/Output: I/O last 3 completed shifts: In: 3712.4 [I.V.:437.3; Other:105; NG/GT:2340; IV Piggyback:830.2] Out: 9296 [Urine:170; VHQIO:9629; Stool:480]   Intake/Output this shift:  Total I/O In: 768 [NG/GT:660; IV Piggyback:108] Out: 2809 [Urine:800; Other:2009]  General: Critically ill looking female intubated, sedated, not responding Heart:RRR, s1s2 nl, no rubs Lungs: Coarse breath sound bilateral Abdomen:soft, Non-tender, non-distended Extremities: Bilateral lower extremity edema present Dialysis Access: Left IJ temporary HD catheter, site looks clean Neurology: Sedated.   Basic Metabolic Panel: Recent Labs  Lab 12/31/19 0347 12/31/19 1614 01/01/20 0433 01/01/20 1536 01/02/20 0426 01/02/20 0426 01/02/20 1535 01/02/20 1535 01/03/20 0359 01/03/20 1620 01/04/20 0427  NA 134*   < > 133*   < > 134*  --  135  --  132* 134* 132*  K 4.9   < > 5.1   < > 4.7  --  4.6  --  4.4 4.8 4.5  CL 102   < > 99   < > 100  --  101  --  100 101 99  CO2 24   < > 24   < > 25  --  26  --  _0 GLUCOSE 151*   < >  210*   < > 144*  --  169*  --  180* 110* 125*  BUN 15   < > 19   < > 20  --  21  --  _0 CREATININE 1.15*   < > 1.18*   < > 1.13*  --  1.06*  --  1.02* 1.04* 0.94  CALCIUM 7.8*   < > 7.8*   < > 7.8*   < > 7.4*   < > 7.3* 7.4* 7.4*  MG 2.4  --  2.6*  --  2.4  --   --   --  2.4  --  2.4  PHOS 2.3*   < > 2.9   < > 2.1*  --  2.7  --  1.7* 3.7 2.0*   < > = values in this interval not displayed.    Liver Function Tests: Recent Labs  Lab 01/02/20 0426 01/02/20 1535 01/03/20 0359 01/03/20 1620 01/04/20 0427  ALBUMIN 1.7* 1.7* 1.6* 1.6*  1.6*   No results for input(s): LIPASE, AMYLASE in the last 168 hours. No results for input(s): AMMONIA in the last 168 hours.  CBC: Recent Labs  Lab 01/01/20 0433 01/02/20 0426 01/02/20 1535 01/03/20 0359 01/04/20 0427  WBC 24.7* 20.5* 17.9* 15.4* 10.8*  HGB 8.2* 8.3* 8.4* 8.2* 8.3*  HCT 27.0* 27.6* 28.0* 27.2* 27.7*  MCV 100.4* 100.4* 101.4* 101.5* 100.7*  PLT 121* 107* 92* 79* 67*    Cardiac Enzymes: No results for input(s): CKTOTAL, CKMB, CKMBINDEX, TROPONINI in the last 168 hours.  BNP: Invalid input(s): POCBNP  CBG: Recent Labs  Lab 01/03/20 1059 01/03/20 1603 01/03/20 2002 01/03/20 2358 01/04/20 0530  GLUCAP 172* 97 135* 125* 125*    Microbiology: Results for orders placed or performed during the hospital encounter of 12/01/2019  MRSA PCR Screening     Status: None   Collection Time: 11/22/2019 11:00 AM   Specimen: Nasopharyngeal  Result Value Ref Range Status   MRSA by PCR NEGATIVE NEGATIVE Final    Comment:        The GeneXpert MRSA Assay (FDA approved for NASAL specimens only), is one component of a comprehensive MRSA colonization surveillance program. It is not intended to diagnose MRSA infection nor to guide or monitor treatment for MRSA infections. Performed at [facility], [place] [address], [place], [place] [address]   Culture, bal-quantitative     Status: Abnormal   Collection Time: 12/07/2019 11:37 AM   Specimen: Bronchoalveolar Lavage  Result Value Ref Range Status   Specimen Description BRONCHIAL ALVEOLAR LAVAGE  Final   Special Requests NONE  Final   Gram Stain   Final    FEW WBC PRESENT,BOTH PMN AND MONONUCLEAR NO ORGANISMS SEEN    Culture (A)  Final    1,000 COLONIES/mL Consistent with normal respiratory flora. Performed at [facility], [place] [address] [place], [place] [address]    Report Status 12/16/2019 FINAL  Final  Culture, blood (routine x 2)     Status: None   Collection Time: 01/08/2020  6:11 AM   Specimen:  BLOOD  Result Value Ref Range Status   Specimen Description BLOOD RIGHT HAND  Final   Special Requests   Final    BOTTLES DRAWN AEROBIC ONLY Blood Culture results may not be optimal due to an inadequate volume of blood received in culture bottles   Culture   Final    NO GROWTH 5 DAYS Performed at [facility], [address] [place], [place] [address]    Report Status 12/26/2019 FINAL  Final  Culture, blood (routine x 2)     Status: None   Collection Time: 12/31/2019  6:16 AM   Specimen: BLOOD  Result Value Ref Range Status   Specimen Description BLOOD RIGHT ARM  Final   Special Requests   Final    BOTTLES DRAWN AEROBIC ONLY Blood Culture results may not be optimal due to an inadequate volume of blood received in culture bottles   Culture   Final    NO GROWTH 5 DAYS Performed at [facility], [place] [address], [place], [place] [address]    Report Status 12/26/2019 FINAL  Final  Body fluid culture     Status: None   Collection Time: 01/13/2020  3:06 PM   Specimen: PATH Cytology Pleural fluid; Body Fluid  Result Value Ref Range Status   Specimen Description PLEURAL FLUID  Final   Special Requests LEFT A  Final   Gram Stain   Final    MODERATE WBC PRESENT, PREDOMINANTLY PMN NO ORGANISMS SEEN    Culture   Final    NO GROWTH 3 DAYS Performed at [facility], [address], [place], [place] [address]    Report Status 12/25/2019 FINAL  Final  Aerobic/Anaerobic Culture (surgical/deep wound)     Status: None   Collection Time: 12/26/2019  3:06 PM   Specimen: PATH Cytology Pleural fluid; Body Fluid  Result Value Ref Range Status   Specimen Description PLEURAL  Final   Special Requests FLUID LEFT  Final   Gram Stain   Final    RARE WBC PRESENT, PREDOMINANTLY PMN NO ORGANISMS SEEN    Culture   Final    No growth aerobically or anaerobically. Performed at [facility], [place] [address], [place], [place] [address]    Report Status 12/26/2019 FINAL  Final   Culture, respiratory (non-expectorated)     Status: None   Collection Time: 12/22/19  8:27 AM   Specimen: Tracheal Aspirate; Respiratory  Result Value Ref Range Status   Specimen Description TRACHEAL ASPIRATE  Final   Special Requests Normal  Final   Gram Stain   Final    RARE WBC PRESENT, PREDOMINANTLY PMN NO ORGANISMS SEEN Performed at [facility], [place] [address] [place], [place] [address]    Culture RARE CANDIDA ALBICANS  Final   Report Status 12/25/2019 FINAL  Final    Coagulation Studies: No results for input(s): LABPROT, INR in the last 72 hours.  Urinalysis: No results for input(s): COLORURINE, LABSPEC, PHURINE, GLUCOSEU, HGBUR, BILIRUBINUR, KETONESUR, PROTEINUR, UROBILINOGEN, NITRITE, LEUKOCYTESUR in the last 72 hours.  Invalid input(s): APPERANCEUR    Imaging: DG Chest Port 1 View  Result Date: 01/03/2020 CLINICAL DATA:  Pleural effusion. EXAM: PORTABLE CHEST 1 VIEW COMPARISON:  12/31/2019 FINDINGS: Sternal wires and right-sided pacemaker unchanged. Enteric tube courses into the region of the stomach and off the film as tip is not visualized. Left IJ central venous catheter unchanged. Endotracheal tube has tip 3.4 cm above the carina. Left basilar chest tube unchanged. Left-sided PICC line unchanged. Lungs are somewhat hypoinflated demonstrate interval improvement in hazy opacification over the mid to lower lungs likely improving effusion/atelectasis. Cardiomediastinal silhouette and remainder of the exam is unchanged. IMPRESSION: 1. Interval improving hazy opacification over the mid to lower lungs likely improving effusion/atelectasis. 2.  Tubes and lines as described. Electronically Signed   By: Marin Olp M.D.  On: 01/03/2020 10:09     Medications:   .  prismasol BGK 4/2.5 500 mL/hr at 01/04/20 0032  .  prismasol BGK 4/2.5 300 mL/hr at 01/04/20 0509  . sodium chloride Stopped (12/30/19 2139)  . dexmedetomidine (PRECEDEX) IV infusion Stopped (12/31/19  1428)  . feeding supplement (VITAL AF 1.2 CAL) 1,000 mL (01/03/20 1205)  . levETIRAcetam Stopped (01/03/20 2144)  . norepinephrine (LEVOPHED) Adult infusion Stopped (12/28/19 0817)  . prismasol BGK 4/2.5 1,500 mL/hr at 01/04/20 0358  . sodium chloride irrigation     . amantadine  100 mg Per Tube BID  . amiodarone  200 mg Per Tube Daily  . aspirin  325 mg Per Tube Daily  . atorvastatin  40 mg Per Tube Daily  . B-complex with vitamin C  1 tablet Per Tube Daily  . chlorhexidine gluconate (MEDLINE KIT)  15 mL Mouth Rinse BID  . Chlorhexidine Gluconate Cloth  6 each Topical Q0600  . docusate  100 mg Per Tube BID  . feeding supplement (PRO-STAT SUGAR FREE 64)  30 mL Per Tube Daily  . insulin aspart  0-20 Units Subcutaneous Q4H  . insulin glargine  5 Units Subcutaneous BID  . mouth rinse  15 mL Mouth Rinse 10 times per day  . pantoprazole sodium  40 mg Per Tube QHS  . phenytoin (DILANTIN) IV  100 mg Intravenous Q8H  . polyethylene glycol  17 g Per Tube TID  . senna-docusate  1 tablet Per Tube BID  . sodium chloride flush  10-40 mL Intracatheter Q12H   sodium chloride, acetaminophen, bisacodyl, fentaNYL (SUBLIMAZE) injection, heparin, hydrALAZINE, midazolam, ondansetron (ZOFRAN) IV, oxyCODONE, senna-docusate, sodium chloride flush  Assessment/ Plan:   Acute kidney injury, oliguric ischemic ATN secondary to hemorrhagic shock requiring pressor support.  Refractory to IV diuretics initiated on CRRT 12/26/2019 left IJ temporary dialysis catheter per CCM.  Negative fluid balance on CRRT 150 cc an hour, chest x-ray shows improvement 01/03/2020.  Increasing urine output , discussed with Dr Elsworth Soho and will stop CRRT. Will follow labs  [facility] for recovery.   ANEMIA-transfusion 5 units packed red blood cells 9 units fresh frozen plasma 2 units cryoprecipitate.  Last transfusion 12/28/2019  Chronic atrial fibrillation status post pacemaker anticoagulation on hold.  Rate controlled with  amiodarone.  Ventilator dependent respiratory failure.  Family refusing tracheostomy.  Hemorrhagic shock secondary to hemothorax status post multiple transfusions 01/10/2020.    Metabolic acidosis resolved   Hemorrhagic right MCA infarction seizure having received thrombectomy complicated by brain edema and craniectomy 10/31/2019  Diabetes mellitus insulin sliding scale  Seizure prophylaxis continues with Dilantin and Keppra  Hypophosphatemia.  We will replace.  Active discussions with palliative medicine involved.  Family receptive to considering all outcomes.  Appeared to have unrealistic expectations of recovery.     LOS: [place] _0 _1 :52 AM

## 2020-01-04 NOTE — Progress Notes (Signed)
NAME:  Krystal Little, MRN:  [id number], DOB:  [birth year], LOS: 21 ADMISSION DATE:  12/10/2019, CONSULTATION DATE:  5/29 REFERRING MD:  Letta Pate, CHIEF COMPLAINT:  Chest congestion   Brief History   40 female with an extensive cardiac history was admitted to [facility] in April in the setting of a right MCA stroke, had a right interventional radiology guided thrombectomy, later had significant brain edema and underwent craniectomy, ultimately transferred to inpatient rehab.  Pulmonary and critical care medicine was consulted on May 29 in the setting of tachypnea and an enlarging left-sided pleural effusion. Thoracentesis was performed which showed a hemothorax.  A CT chest was then ordered showing obstruction of her left mainstem bronchus and a loculated left pleural effusion.  On May 30 she was moved to the ICU for chest tube placement and bronchoscopy.  Underwent intrapleural TPA but required VATS eventually, course further complicated by ICH , seizures and AKI requiring CRRT  Past Medical History  Status post mitral valve repair 2001 Status post cardiac pacemaker 2001 History of sick sinus syndrome Hypertension Diabetes mellitus type 2 Chronic atrial fibrillation Right MCA stroke 2021  Significant Hospital Events   4/16 IR thrombectomy R MCA stroke 4/25 PCCM sign off 5/6 transfer to [facility] 5/29 PCCM consult left pleural effusion, thoracentesis 5/30 worsening tachypnea. Move to ICU for intubation to facilitate bronchoscopy and chest tube placement 5/31 TPA/ pulmonzyme started for 3 days for persistent left effusion 6/01 Afebrile, low-dose Levophed, Precedex, Minimal output on chest tube- pulmozyme / tpa x 2,  6/02 Weaned x 7 hours yesterday PSV 15/5, pulmozyme and TPA completed 3 days 6/03 afib with RVR placed on amio gtt overnight , 1U PRBC for Hb 7.1  6/05 extubated successfully, Repeat TPA/Pulmonzyme given again  6/06 acute decompensation overnight,  hemorrhagic shock > to OR for left VATS with 2L hemothorax drained.  CT head with new ICH> neurosurgery consulted 6/09 More awake but agitated on PSV weaning  6/12: Seizures + on EEG   Consults:  PCCM  Procedures:  5/29 Thoracentesis >> 1.5 L bloody fluid 5/30 ETT >  5/30 L pigtail chest tube >  Bronchoscopy 5/30 >>  large, thick, grey mucus plug completely occluding the left mainstem bronchus.  [facility] HD cath 6/12 >  Significant Diagnostic Tests:  Thoracentesis left chest 5/29 >> 8K WBC, 78% WBC, protein 4.0, LDH 182 CT chest abdomen pelvis 6/6 >> large left hemithorax, patchy right lung opacities with small right effusion.  No acute abdominal process CT head 6/6 >> evolution of right MCA infarct with hemorrhagic transformation with large intraparenchymal hematoma, 2 additional small hemorrhages in the inferior cerebellum and left temporal lobe.  Trace subarachnoid blood. CT Head 6/10 >> stable hemorrhagic R MCA.  Right hemi-craniectomy with no midline shift or mass effect.  Mildly regressed small cerebellar hemorrhages.  Stable small volume intraventricular hemorrhage, no ventriculomegaly.  Trace subarachnoid hemorrhage largely resolved.  6/12 EEG: right frontocentral seizures X2   Micro Data:   RVP 4/16 neg  MRSA PCR  4/17 neg   ======== 5/29 Left pleural fluid culture > negative 5/29 left pleural fluid cytology >> No malignant cells identified. Blood and acute inflammation.  5/30 BAL >> rare candida albicans 6/06 L Pleural Fluid >> negative 6/06 BCx2 >> negative   Antimicrobials:  Cefepime 4/24 >> 4/25 Zosyn 4/28 >> 5/4 Zosyn 5/30 >> 6/5 Cefepime 6/6 >> 6/12  Interim history/subjective:   Remains critically ill, intubated On CRRT Afebrile  800 cc  of urine documented last shift  Objective   Blood pressure (!) 120/56, pulse 63, temperature (!) 96.8 F (36 C), temperature source Oral, resp. rate (!) 25, height _0  (1.626 m), weight 61.5 kg, SpO2 100 %. CVP:  [8  mmHg-20 mmHg] 20 mmHg  Vent Mode: PRVC FiO2 (%):  [30 %] 30 % Set Rate:  [20 bmp] 20 bmp Vt Set:  [430 mL] 430 mL PEEP:  [5 cmH20] 5 cmH20 Plateau Pressure:  [14 cmH20-20 cmH20] 14 cmH20   Intake/Output Summary (Last 24 hours) at 01/04/2020 0945 Last data filed at 01/04/2020 0800 Gross per 24 hour  Intake 1846.42 ml  Output 5453 ml  Net -3606.58 ml   Filed Weights   01/02/20 0416 01/03/20 0530 01/04/20 0421  Weight: 71.2 kg 65.1 kg 61.5 kg    Examination:  General: Elderly female chronically ill appearing on vent, no distress HEENT: Status post right-sided hemicraniectomy Neuro: Sedate, intermittently following commands with the right arm, left dense hemiplegia CV: Regular rate rhythm, S1-S2 PULM: Bilateral ventilated breath sounds , minimal secretions, no accessory muscle use , chest tubes with minimal sanguinous drainage GI:, Nontender nondistended Extremities: Decreased edema   Chest x-ray 6/19 personally reviewed continues to show loculated left effusion status post chest tubes x2, improved right lower lobe infiltrate  Labs show resolved leukocytosis, stable anemia, mild hyponatremia and hypophosphatemia, low albumin 1.6  Assessment & Plan:   Acute Respiratory Failure with Hypoxemia Mucus Plugging, Suspected Aspiration   Left Pleural Effusion s/p Thoracentesis, L Chest Tube  Right pleural effusion, worsening -W UA/SBT daily -Attempt spontaneous breathing trials but with fluctuant mental status and significant comorbidities, extubation would be suboptimal at best -Tracheostomy seems to be the only way forward   AKI secondary to shock - no evidence of obstruction on renal US. Hypernatremia. 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    -No neurosurgical intervention -Supportive care  Status Epilepticus, likely related to underlying stroke history :EEG off 6/14 -Keppra and Dilantin per neuro, as needed Ativan for seizure-like activity  DM2 with Hyperglycemia -CBG, SSI plus Lantus 5 q 12  Chronic Atrial Fibrillation s/p Pacemaker -Telemetry -Anticoagulation held due to bleeding -Change amiodarone to oral, resume Cardizem at some point  Moderate Protein Calorie Malnutrition  -Continue tube feeding  Goals of Care P: -Full code  Resolved: - Hemorrhagic shock secondary to hemothorax   Summary -she has now been intubated for 3 weeks and tracheostomy seems to be the only way forward. Husband seems focused on what caused mucus buildup on rehab floor. Both CCM & palliative care provider have discussed this with him numerous times.  Per discussion with husband in 6/17 , the only outcome he will accept is  "for her to walk out of here" -seems unrealistic at this point but he went to progress towards that goal, tracheostomy seems necessary now.  We will continue to impress this upon him in our discussions  Best practice:  Diet: TF  Pain/Anxiety/Delirium protocol (if indicated): fent prn VAP protocol (if indicated): yes DVT prophylaxis: SCDs GI prophylaxis: PPI Glucose control: SSI Mobility: BR Code Status: full code Family Communication: Husband and daughter Disposition:  ICU   The patient is critically ill with multiple organ systems failure and requires high complexity decision making  for assessment and support, frequent evaluation and titration of therapies, application of advanced monitoring technologies and extensive interpretation of multiple databases. Critical Care Time devoted to patient care services described in this note independent of APP/resident  time is 31 minutes.    Kara Mead MD. [place] Flood. [facility]  If no response to pager , please call [phone]   01/04/2020

## 2020-01-05 LAB — RENAL FUNCTION PANEL
Albumin: 1.5 g/dL — ABNORMAL LOW (ref 3.5–5.0)
Albumin: 1.4 g/dL — ABNORMAL LOW (ref 3.5–5.0)
Anion gap: 11 (ref 5–15)
Anion gap: 9 (ref 5–15)
BUN: 49 mg/dL — ABNORMAL HIGH (ref 8–23)
BUN: 17 mg/dL (ref 8–23)
CO2: 22 mmol/L (ref 22–32)
CO2: 27 mmol/L (ref 22–32)
Calcium: 7.2 mg/dL — ABNORMAL LOW (ref 8.9–10.3)
Calcium: 7.4 mg/dL — ABNORMAL LOW (ref 8.9–10.3)
Chloride: 100 mmol/L (ref 98–111)
Chloride: 99 mmol/L (ref 98–111)
Creatinine, Ser: 2.23 mg/dL — ABNORMAL HIGH (ref 0.44–1.00)
Creatinine, Ser: 1.16 mg/dL — ABNORMAL HIGH (ref 0.44–1.00)
GFR calc Af Amer: 26 mL/min — ABNORMAL LOW (ref >60)
GFR calc Af Amer: 57 mL/min — ABNORMAL LOW (ref >60)
GFR calc non Af Amer: 22 mL/min — ABNORMAL LOW (ref >60)
GFR calc non Af Amer: 49 mL/min — ABNORMAL LOW (ref >60)
Glucose, Bld: 187 mg/dL — ABNORMAL HIGH (ref 70–99)
Glucose, Bld: 138 mg/dL — ABNORMAL HIGH (ref 70–99)
Phosphorus: 2.3 mg/dL — ABNORMAL LOW (ref 2.5–4.6)
Phosphorus: 1.7 mg/dL — ABNORMAL LOW (ref 2.5–4.6)
Potassium: 5.8 mmol/L — ABNORMAL HIGH (ref 3.5–5.1)
Potassium: 3.2 mmol/L — ABNORMAL LOW (ref 3.5–5.1)
Sodium: 133 mmol/L — ABNORMAL LOW (ref 135–145)
Sodium: 135 mmol/L (ref 135–145)

## 2020-01-05 LAB — CBC
HCT: 26.9 % — ABNORMAL LOW (ref 36.0–46.0)
HCT: 24.2 % — ABNORMAL LOW (ref 36.0–46.0)
Hemoglobin: 8.4 g/dL — ABNORMAL LOW (ref 12.0–15.0)
Hemoglobin: 7.5 g/dL — ABNORMAL LOW (ref 12.0–15.0)
MCH: 30.7 pg (ref 26.0–34.0)
MCH: 30.1 pg (ref 26.0–34.0)
MCHC: 31.2 g/dL (ref 30.0–36.0)
MCHC: 31 g/dL (ref 30.0–36.0)
MCV: 98.2 fL (ref 80.0–100.0)
MCV: 97.2 fL (ref 80.0–100.0)
Platelets: 98 10*3/uL — ABNORMAL LOW (ref 150–400)
Platelets: 85 10*3/uL — ABNORMAL LOW (ref 150–400)
RBC: 2.74 MIL/uL — ABNORMAL LOW (ref 3.87–5.11)
RBC: 2.49 MIL/uL — ABNORMAL LOW (ref 3.87–5.11)
RDW: 23.9 % — ABNORMAL HIGH (ref 11.5–15.5)
RDW: 23.4 % — ABNORMAL HIGH (ref 11.5–15.5)
WBC: 13.3 10*3/uL — ABNORMAL HIGH (ref 4.0–10.5)
WBC: 10.2 10*3/uL (ref 4.0–10.5)
nRBC: 0.2 % (ref 0.0–0.2)
nRBC: 0.2 % (ref 0.0–0.2)

## 2020-01-05 LAB — GLUCOSE, CAPILLARY
Glucose-Capillary: 172 mg/dL — ABNORMAL HIGH (ref 70–99)
Glucose-Capillary: 198 mg/dL — ABNORMAL HIGH (ref 70–99)
Glucose-Capillary: 184 mg/dL — ABNORMAL HIGH (ref 70–99)
Glucose-Capillary: 88 mg/dL (ref 70–99)
Glucose-Capillary: 178 mg/dL — ABNORMAL HIGH (ref 70–99)

## 2020-01-05 LAB — MAGNESIUM: Magnesium: 2.7 mg/dL — ABNORMAL HIGH (ref 1.7–2.4)

## 2020-01-05 LAB — HEPATITIS B SURFACE ANTIGEN: Hepatitis B Surface Ag: NONREACTIVE

## 2020-01-05 MED ORDER — ALTEPLASE 2 MG IJ SOLR
2.0000 mg | Freq: Once | INTRAMUSCULAR | Status: DC | PRN
Start: 2020-01-05 — End: 2020-02-02

## 2020-01-05 MED ORDER — PENTAFLUOROPROP-TETRAFLUOROETH EX AERO: 1.0000 "application " | INHALATION_SPRAY | CUTANEOUS | Status: DC | PRN

## 2020-01-05 MED ORDER — SODIUM CHLORIDE 0.9 % IV SOLN: 100.0000 mL | INTRAVENOUS | Status: DC | PRN

## 2020-01-05 MED ORDER — LIDOCAINE-PRILOCAINE 2.5-2.5 % EX CREA: 1.0000 "application " | TOPICAL_CREAM | CUTANEOUS | Status: DC | PRN

## 2020-01-05 MED ORDER — CHLORHEXIDINE GLUCONATE CLOTH 2 % EX PADS
6.0000 | MEDICATED_PAD | Freq: Every day | CUTANEOUS | Status: DC
  Administered 2020-01-05 – 2020-01-16 (×10): 6 via TOPICAL

## 2020-01-05 MED ORDER — HEPARIN SODIUM (PORCINE) 5000 UNIT/ML IJ SOLN
5000.0000 [IU] | Freq: Three times a day (TID) | INTRAMUSCULAR | Status: DC
  Administered 2020-01-05 – 2020-01-08 (×8): 5000 [IU] via SUBCUTANEOUS
  Filled 2020-01-05 (×8): qty 1

## 2020-01-05 MED ORDER — HEPARIN SODIUM (PORCINE) 1000 UNIT/ML DIALYSIS
1000.0000 [IU] | INTRAMUSCULAR | Status: DC | PRN
  Administered 2020-01-07: 1000 [IU] via INTRAVENOUS_CENTRAL
  Administered 2020-01-09: 2800 [IU] via INTRAVENOUS_CENTRAL
  Administered 2020-01-12: 1000 [IU] via INTRAVENOUS_CENTRAL
  Administered 2020-01-21: 3200 [IU] via INTRAVENOUS_CENTRAL
  Administered 2020-01-30: 1000 [IU] via INTRAVENOUS_CENTRAL
  Filled 2020-01-05 (×2): qty 1

## 2020-01-05 MED ORDER — SODIUM ZIRCONIUM CYCLOSILICATE 10 G PO PACK
10.0000 g | PACK | Freq: Two times a day (BID) | ORAL | Status: DC
  Administered 2020-01-05 (×2): 10 g via ORAL
  Filled 2020-01-05 (×3): qty 1

## 2020-01-05 MED ORDER — LIDOCAINE HCL (PF) 1 % IJ SOLN: 5.0000 mL | INTRAMUSCULAR | Status: DC | PRN

## 2020-01-05 NOTE — Progress Notes (Signed)
[facility] Progress Note    Assessment/ Plan:    Acute kidney injury, oliguric ischemic ATN secondary to hemorrhagic shock requiring pressor support.  Refractory to IV diuretics initiated on CRRT 12/26/2019- 6/20.  Off pressor, now with rise of Cr and K 5.8.  Have given Lokelma, IHD orders written today.  She is a very poor short or long term dialysis candidate at present.  Appreciate palliative care.      Chronic atrial fibrillation status post pacemaker anticoagulation on hold.  Rate controlled with amiodarone.   Ventilator dependent respiratory failure.  Family refusing tracheostomy.   Hemorrhagic shock secondary to hemothorax status post multiple transfusions 12/29/2019.     Metabolic acidosis resolved    Hemorrhagic right MCA infarction seizure having received thrombectomy complicated by brain edema and craniectomy 10/31/2019   Diabetes mellitus insulin sliding scale   Seizure prophylaxis continues with Dilantin and Keppra   Hypophosphatemia.  We will replace.   Active discussions with palliative medicine involved.   Subjective:    CRRT stopped yesterday with rise of Cr and K up to 5.8 today.  Intermittent HD orders placed.   Objective:   BP (!) 156/60   Pulse 62   Temp 98.6 F (37 C) (Oral)   Resp (!) 36   Ht '5\' 4"'  (1.626 m)   Wt 60.6 kg   SpO2 100%   BMI 22.93 kg/m   Intake/Output Summary (Last 24 hours) at 01/05/2020 1033 Last data filed at 01/05/2020 1001 Gross per 24 hour  Intake 7366 ml  Output 3760 ml  Net 3606 ml   Weight change: -0.9 kg  Physical Exam: Gen: intubated, unresponsive HEENT: R hemicraniectomy  CVS: RRR Resp: clear bilaterally Abd: soft Ext: no LE edema ACCESS: L IJ nontunneled HD cath  Imaging: No results found.  Labs: BMET Recent Labs  Lab 01/01/20 1536 01/02/20 0426 01/02/20 1535 01/03/20 0359 01/03/20 1620 01/04/20 0427 01/05/20 0412  NA 134* 134* 135 132* 134* 132* 133*  K 4.8 4.7 4.6 4.4  4.8 4.5 5.8*  CL 100 100 101 100 101 99 100  CO2 '25 25 26 25 26 26 22  ' GLUCOSE 205* 144* 169* 180* 110* 125* 187*  BUN '20 20 21 20 22 21 ' 49*  CREATININE 1.14* 1.13* 1.06* 1.02* 1.04* 0.94 2.23*  CALCIUM 7.9* 7.8* 7.4* 7.3* 7.4* 7.4* 7.2*  PHOS 2.4* 2.1* 2.7 1.7* 3.7 2.0* 2.3*   CBC Recent Labs  Lab 01/02/20 1535 01/03/20 0359 01/04/20 0427 01/05/20 0412  WBC 17.9* 15.4* 10.8* 13.3*  HGB 8.4* 8.2* 8.3* 8.4*  HCT 28.0* 27.2* 27.7* 26.9*  MCV 101.4* 101.5* 100.7* 98.2  PLT 92* 79* 67* 98*    Medications:    . amantadine  100 mg Per Tube BID  . amiodarone  200 mg Per Tube Daily  . aspirin  325 mg Per Tube Daily  . atorvastatin  40 mg Per Tube Daily  . B-complex with vitamin C  1 tablet Per Tube Daily  . chlorhexidine gluconate (MEDLINE KIT)  15 mL Mouth Rinse BID  . Chlorhexidine Gluconate Cloth  6 each Topical Q0600  . docusate  100 mg Per Tube BID  . feeding supplement (PRO-STAT SUGAR FREE 64)  30 mL Per Tube Daily  . heparin injection (subcutaneous)  5,000 Units Subcutaneous Q8H  . insulin aspart  0-20 Units Subcutaneous Q4H  . insulin glargine  5 Units Subcutaneous BID  . mouth rinse  15 mL Mouth Rinse 10 times per day  .  pantoprazole sodium  40 mg Per Tube QHS  . phenytoin (DILANTIN) IV  100 mg Intravenous Q8H  . polyethylene glycol  17 g Per Tube TID  . senna-docusate  1 tablet Per Tube BID  . sodium chloride flush  10-40 mL Intracatheter Q12H  . sodium zirconium cyclosilicate  10 g Oral BID      Madelon Lips, MD 01/05/2020, 10:33 AM

## 2020-01-05 NOTE — Progress Notes (Signed)
NAME:  Krystal Little, MRN:  [id number], DOB:  1955/12/09, LOS: 38 ADMISSION DATE:  11/18/2019, CONSULTATION DATE:  5/29 REFERRING MD:  Letta Pate, CHIEF COMPLAINT:  Chest congestion   Brief History   48 female with an extensive cardiac history was admitted to [facility] in April in the setting of a right MCA stroke, had a right interventional radiology guided thrombectomy, later had significant brain edema and underwent craniectomy, ultimately transferred to inpatient rehab.  Pulmonary and critical care medicine was consulted on May 29 in the setting of tachypnea and an enlarging left-sided pleural effusion. Thoracentesis was performed which showed a hemothorax.  A CT chest was then ordered showing obstruction of her left mainstem bronchus and a loculated left pleural effusion.  On May 30 she was moved to the ICU for chest tube placement and bronchoscopy.  Underwent intrapleural TPA but required VATS eventually, course further complicated by ICH , seizures and AKI requiring CRRT  Past Medical History  Status post mitral valve repair 2001 Status post cardiac pacemaker 2001 History of sick sinus syndrome Hypertension Diabetes mellitus type 2 Chronic atrial fibrillation Right MCA stroke 2021  Significant Hospital Events   4/16 IR thrombectomy R MCA stroke 4/25 PCCM sign off 5/6 transfer to [facility] 5/29 PCCM consult left pleural effusion, thoracentesis 5/30 worsening tachypnea. Move to ICU for intubation to facilitate bronchoscopy and chest tube placement 5/31 TPA/ pulmonzyme started for 3 days for persistent left effusion 6/01 Afebrile, low-dose Levophed, Precedex, Minimal output on chest tube- pulmozyme / tpa x 2,  6/02 Weaned x 7 hours yesterday PSV 15/5, pulmozyme and TPA completed 3 days 6/03 afib with RVR placed on amio gtt overnight , 1U PRBC for Hb 7.1  6/05 extubated successfully, Repeat TPA/Pulmonzyme given again  6/06 acute decompensation overnight,  hemorrhagic shock > to OR for left VATS with 2L hemothorax drained.  CT head with new ICH> neurosurgery consulted 6/09 More awake but agitated on PSV weaning  6/12: Seizures + on EEG   Consults:  PCCM  Procedures:  5/29 Thoracentesis >> 1.5 L bloody fluid 5/30 ETT >  5/30 L pigtail chest tube >  Bronchoscopy 5/30 >>  large, thick, grey mucus plug completely occluding the left mainstem bronchus.  [facility] HD cath 6/12 >  Significant Diagnostic Tests:  Thoracentesis left chest 5/29 >> 8K WBC, 78% WBC, protein 4.0, LDH 182 CT chest abdomen pelvis 6/6 >> large left hemithorax, patchy right lung opacities with small right effusion.  No acute abdominal process CT head 6/6 >> evolution of right MCA infarct with hemorrhagic transformation with large intraparenchymal hematoma, 2 additional small hemorrhages in the inferior cerebellum and left temporal lobe.  Trace subarachnoid blood. CT Head 6/10 >> stable hemorrhagic R MCA.  Right hemi-craniectomy with no midline shift or mass effect.  Mildly regressed small cerebellar hemorrhages.  Stable small volume intraventricular hemorrhage, no ventriculomegaly.  Trace subarachnoid hemorrhage largely resolved.  6/12 EEG: right frontocentral seizures X2   Micro Data:   RVP 4/16 neg  MRSA PCR  4/17 neg   ======== 5/29 Left pleural fluid culture > negative 5/29 left pleural fluid cytology >> No malignant cells identified. Blood and acute inflammation.  5/30 BAL >> rare candida albicans 6/06 L Pleural Fluid >> negative 6/06 BCx2 >> negative   Antimicrobials:  Cefepime 4/24 >> 4/25 Zosyn 4/28 >> 5/4 Zosyn 5/30 >> 6/5 Cefepime 6/6 >> 6/12  Interim history/subjective:   No events. Awake, moving R side. On PS. Off CRRT  On continuous bladder irrigation for some clots which seem to have resolved.  Objective   Blood pressure 128/62, pulse 65, temperature 98.6 F (37 C), temperature source Oral, resp. rate 19, height _0  (1.626 m), weight 60.6 kg,  SpO2 100 %. CVP:  [10 mmHg-18 mmHg] 15 mmHg  Vent Mode: PRVC FiO2 (%):  [30 %] 30 % Set Rate:  [20 bmp] 20 bmp Vt Set:  [430 mL] 430 mL PEEP:  [5 cmH20] 5 cmH20 Pressure Support:  [10 cmH20] 10 cmH20 Plateau Pressure:  [20 cmH20-23 cmH20] 22 cmH20   Intake/Output Summary (Last 24 hours) at 01/05/2020 0826 Last data filed at 01/05/2020 0600 Gross per 24 hour  Intake 4406 ml  Output 989 ml  Net 3417 ml   Filed Weights   01/03/20 0530 01/04/20 0421 01/05/20 0153  Weight: 65.1 kg 61.5 kg 60.6 kg    Examination:  GEN: NAD HEENT: ETT in place, minimal secretions CV: RRR, ext warm PULM: Scattered transmitted upper airway sounds, requiring high PS to get volumes c/w muscular weakness GI: Soft, +BS, rectal tube in place EXT: Minimal anasarca NEURO: Moves R side to command, L hemiparesis PSYCH: RASS 0 SKIN: No rashes  L chest tubes in place with serosanguinous output Cr 2.2 from 0.9, K up to 5.8 Off CRRT  Assessment & Plan:   Acute Respiratory Failure with Hypoxemia Mucus Plugging, Suspected Aspiration   Left loculated effusion complicated by hemothorax post VATS decortications - PS trials as able - Chest tube management per TCTS, appreciate help -Tracheostomy seems to be the only way forward, husband still considering   AKI secondary to shock - no evidence of obstruction on renal US. 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   -No neurosurgical intervention -Supportive care  Status Epilepticus, likely related to underlying stroke history :EEG off 6/14 -Keppra and Dilantin per neuro, as needed Ativan for seizure-like activity  DM2 with Hyperglycemia -CBG, SSI plus Lantus 5 q 12  Chronic Atrial Fibrillation s/p Pacemaker -Telemetry -Anticoagulation held due to hemorrhagic CVA transformation, can probably go with ppx dose this far out but full AC  will be contraindicated for foreseeable future - Oral amiodarone  Moderate Protein Calorie Malnutrition  -Continue tube feeding  Goals of Care -Full code Husband unrealistic regarding prognosis after CVA  Best practice:  Diet: TF  Pain/Anxiety/Delirium protocol (if indicated): fent prn VAP protocol (if indicated): yes DVT prophylaxis: Heparin subQ GI prophylaxis: PPI Glucose control: SSI Mobility: BR Code Status: full code Family Communication: will update when they come in Disposition:  ICU    The patient is critically ill with multiple organ systems failure and requires high complexity decision making for assessment and support, frequent evaluation and titration of therapies, application of advanced monitoring technologies and extensive interpretation of multiple databases. Critical Care Time devoted to patient care services described in this note independent of APP/resident time (if applicable)  is 35 minutes.   Erskine Emery MD [facility] 01/05/2020 8:45 AM Personal pager: [phone] If unanswered, please page CCM On-call: [phone]

## 2020-01-05 NOTE — Progress Notes (Signed)
15 Days Post-Op Procedure(s) (LRB): Video Assisted Thoracoscopy (Vats) left , Drainage of Hemothorax (Left) Video Bronchoscopy (N/A) Subjective: No purposeful movement Last CXR with improved , min bilat effusions L chest tube output low- will remove one tube today Objective: Vital signs in last 24 hours: Temp:  [98.6 F (37 C)-99.9 F (37.7 C)] 98.6 F (37 C) (06/21 0754) Pulse Rate:  [60-75] 63 (06/21 0900) Cardiac Rhythm: Ventricular paced (06/21 0900) Resp:  [19-35] 30 (06/21 0900) BP: (106-186)/(42-77) 137/55 (06/21 0900) SpO2:  [99 %-100 %] 100 % (06/21 0900) FiO2 (%):  [30 %] 30 % (06/21 0838) Weight:  [60.6 kg] 60.6 kg (06/21 0153)  Hemodynamic parameters for last 24 hours: CVP:  [9 mmHg-18 mmHg] 9 mmHg  Intake/Output from previous day: 06/20 0701 - 06/21 0700 In: 4466 [NG/GT:1250; IV Piggyback:216] Out: 1195 [Urine:350; Stool:230; Chest Tube:30] Intake/Output this shift: Total I/O In: 120 [NG/GT:120] Out: 200 [Stool:200]  EXAM Breath sounds clear L VATS incision clean and dry No air leak from tubes  Lab Results: Recent Labs    01/04/20 0427 01/05/20 0412  WBC 10.8* 13.3*  HGB 8.3* 8.4*  HCT 27.7* 26.9*  PLT 67* 98*   BMET:  Recent Labs    01/04/20 0427 01/05/20 0412  NA 132* 133*  K 4.5 5.8*  CL 99 100  CO2 26 22  GLUCOSE 125* 187*  BUN 21 49*  CREATININE 0.94 2.23*  CALCIUM 7.4* 7.2*    PT/INR: No results for input(s): LABPROT, INR in the last 72 hours. ABG    Component Value Date/Time   PHART 7.403 12/22/2019 0348   HCO3 22.8 12/22/2019 0348   TCO2 24 12/22/2019 0348   ACIDBASEDEF 2.0 12/22/2019 0348   O2SAT 99.0 12/22/2019 0348   CBG (last 3)  Recent Labs    01/04/20 2343 01/05/20 0426 01/05/20 0755  GLUCAP 131* 172* 198*    Assessment/Plan: S/P Procedure(s) (LRB): Video Assisted Thoracoscopy (Vats) left , Drainage of Hemothorax (Left) Video Bronchoscopy (N/A) Remains with severe neuro deficit Will DC one chest tube  today    LOS: 22 days    Krystal Little 01/05/2020

## 2020-01-05 NOTE — Progress Notes (Signed)
CBI stopped at this time. 2742ml of clear NS/Urine emptied. RN will continue to monitor.

## 2020-01-06 ENCOUNTER — Inpatient Hospital Stay (HOSPITAL_COMMUNITY): Payer: Medicare Other

## 2020-01-06 DIAGNOSIS — J9601 Acute respiratory failure with hypoxia: Secondary | ICD-10-CM

## 2020-01-06 LAB — GLUCOSE, CAPILLARY
Glucose-Capillary: 138 mg/dL — ABNORMAL HIGH (ref 70–99)
Glucose-Capillary: 150 mg/dL — ABNORMAL HIGH (ref 70–99)
Glucose-Capillary: 163 mg/dL — ABNORMAL HIGH (ref 70–99)
Glucose-Capillary: 170 mg/dL — ABNORMAL HIGH (ref 70–99)
Glucose-Capillary: 179 mg/dL — ABNORMAL HIGH (ref 70–99)
Glucose-Capillary: 184 mg/dL — ABNORMAL HIGH (ref 70–99)
Glucose-Capillary: 63 mg/dL — ABNORMAL LOW (ref 70–99)

## 2020-01-06 LAB — CBC
HCT: 23.1 % — ABNORMAL LOW (ref 36.0–46.0)
Hemoglobin: 7.1 g/dL — ABNORMAL LOW (ref 12.0–15.0)
MCH: 30.1 pg (ref 26.0–34.0)
MCHC: 30.7 g/dL (ref 30.0–36.0)
MCV: 97.9 fL (ref 80.0–100.0)
Platelets: 93 10*3/uL — ABNORMAL LOW (ref 150–400)
RBC: 2.36 MIL/uL — ABNORMAL LOW (ref 3.87–5.11)
RDW: 23.7 % — ABNORMAL HIGH (ref 11.5–15.5)
WBC: 8.9 10*3/uL (ref 4.0–10.5)
nRBC: 0 % (ref 0.0–0.2)

## 2020-01-06 LAB — COMPREHENSIVE METABOLIC PANEL
ALT: 119 U/L — ABNORMAL HIGH (ref 0–44)
AST: 209 U/L — ABNORMAL HIGH (ref 15–41)
Albumin: 1.4 g/dL — ABNORMAL LOW (ref 3.5–5.0)
Alkaline Phosphatase: 966 U/L — ABNORMAL HIGH (ref 38–126)
Anion gap: 11 (ref 5–15)
BUN: 33 mg/dL — ABNORMAL HIGH (ref 8–23)
CO2: 27 mmol/L (ref 22–32)
Calcium: 7.4 mg/dL — ABNORMAL LOW (ref 8.9–10.3)
Chloride: 96 mmol/L — ABNORMAL LOW (ref 98–111)
Creatinine, Ser: 2.23 mg/dL — ABNORMAL HIGH (ref 0.44–1.00)
GFR calc Af Amer: 26 mL/min — ABNORMAL LOW (ref >60)
GFR calc non Af Amer: 22 mL/min — ABNORMAL LOW (ref >60)
Glucose, Bld: 154 mg/dL — ABNORMAL HIGH (ref 70–99)
Potassium: 3.6 mmol/L (ref 3.5–5.1)
Sodium: 134 mmol/L — ABNORMAL LOW (ref 135–145)
Total Bilirubin: 0.8 mg/dL (ref 0.3–1.2)
Total Protein: 7.1 g/dL (ref 6.5–8.1)

## 2020-01-06 LAB — APTT: aPTT: 47 seconds — ABNORMAL HIGH (ref 24–36)

## 2020-01-06 LAB — PROTIME-INR
INR: 1.4 — ABNORMAL HIGH (ref 0.8–1.2)
Prothrombin Time: 16.6 seconds — ABNORMAL HIGH (ref 11.4–15.2)

## 2020-01-06 MED ORDER — MIDAZOLAM HCL 2 MG/2ML IJ SOLN
5.0000 mg | Freq: Once | INTRAMUSCULAR | Status: AC
  Administered 2020-01-06: 2 mg via INTRAVENOUS
  Filled 2020-01-06: qty 6

## 2020-01-06 MED ORDER — NOREPINEPHRINE 4 MG/250ML-% IV SOLN
INTRAVENOUS | Status: AC
  Administered 2020-01-06: 4 ug/min via INTRAVENOUS
  Filled 2020-01-06: qty 250

## 2020-01-06 MED ORDER — FENTANYL CITRATE (PF) 100 MCG/2ML IJ SOLN
200.0000 ug | Freq: Once | INTRAMUSCULAR | Status: AC
  Administered 2020-01-06: 50 ug via INTRAVENOUS
  Filled 2020-01-06: qty 4

## 2020-01-06 MED ORDER — STERILE WATER FOR INJECTION IJ SOLN
INTRAMUSCULAR | Status: AC
  Administered 2020-01-06: 10 mL
  Filled 2020-01-06: qty 10

## 2020-01-06 MED ORDER — DEXTROSE 50 % IV SOLN
INTRAVENOUS | Status: AC
  Administered 2020-01-06: 50 mL
  Filled 2020-01-06: qty 50

## 2020-01-06 MED ORDER — SODIUM CHLORIDE 0.9 % IV SOLN
750.0000 mg | INTRAVENOUS | Status: DC
  Administered 2020-01-07 – 2020-01-17 (×11): 750 mg via INTRAVENOUS
  Filled 2020-01-06 (×12): qty 7.5

## 2020-01-06 MED ORDER — VECURONIUM BROMIDE 10 MG IV SOLR
10.0000 mg | Freq: Once | INTRAVENOUS | Status: AC
  Administered 2020-01-06: 10 mg via INTRAVENOUS
  Filled 2020-01-06: qty 10

## 2020-01-06 MED ORDER — PROPOFOL 10 MG/ML IV BOLUS
500.0000 mg | Freq: Once | INTRAVENOUS | Status: DC
  Filled 2020-01-06: qty 60

## 2020-01-06 MED ORDER — PROPOFOL 500 MG/50ML IV EMUL
INTRAVENOUS | Status: AC
  Filled 2020-01-06: qty 50

## 2020-01-06 MED ORDER — NOREPINEPHRINE 4 MG/250ML-% IV SOLN: 0.0000 ug/min | INTRAVENOUS | Status: DC

## 2020-01-06 MED ORDER — ETOMIDATE 2 MG/ML IV SOLN
40.0000 mg | Freq: Once | INTRAVENOUS | Status: AC
  Administered 2020-01-06: 20 mg via INTRAVENOUS
  Filled 2020-01-06: qty 20

## 2020-01-06 NOTE — Progress Notes (Signed)
°   01/06/20 1500  Clinical Encounter Type  Visited With Patient and family together  Visit Type Follow-up   Chaplain engaged in follow-up visit with Krystal Little, and engaged in initial visit with Krystal husband Krystal Little.  Krystal Little shared with chaplain that years ago he had his own health scare in which doctors and a chaplain noted that he was not going to make it.  Krystal Little shared that medical professionals had essentially written him off, but today he is able to stand as a "miracle" and testament of what it means to fight and not give up.  He has also been able to see that in Krystal Little healthcare journey as well.  Medical professionals did not expect that she would make it to rehab at one point, but she did.  Krystal Little recounted seeing the looks on doctors and nurses faces as she has surpassed their prognoses. Mr. And Mrs. Little have a strong faith that has been reinforced by personal experiences in which they overcame challenges that others proclaimed they would not.  Krystal Little stands firmly on Krystal Little being able to walk out of the hospital eventually.    The Krystal Little are also a family of athletes.  Krystal Little and Krystal Little both have played basketball and Krystal Little played football.  As Krystal Little shared they are very strong-willed and determined.  They even have a mantra that they recite that essentially references not giving up. Krystal Little shared that they have a different mindset.  Chaplain assesses that they are used to challenges and creating pathways to obtaining results.  Chaplain assessed that Krystal Little is dependent on God and "not man" to make the decisions concerning Krystal Little.  He has been accustomed to hearing what he calls "doom and gloom" from medical professionals while having experiences that say otherwise.  He has seen medical professionals treat Krystal Little like a case instead of as a human being, and has noted that most doctors do not know his name or his wife's name.  Chaplain assessed  in that moment that [name] values relationship and compassion from those treating his wife and interacting with him and his family.  During this visit, chaplain provided the ministries of presence and listening.  Chaplain has been able to speak with Krystal Little's Little, Krystal Little, on several different encounters and was now able to speak with Krystal Little.  Chaplain offered support and will continue to follow-up.

## 2020-01-06 NOTE — Progress Notes (Signed)
Daily Progress Note   Patient Name: Krystal Little       Date: 01/06/2020 DOB: Apr 06, 1956  Age: 64 y.o. MRN#: [id number] Attending Physician: Candee Furbish, MD Primary Care Physician: Stark Klein, MD Admit Date: 12/08/2019  Reason for Consultation/Follow-up: Establishing goals of care  Subjective: Met with spouse at bedside. He is encouraged by removal of chest tube and by patient being transitioned to intermittent HD.  He has consented to trach today at 4pm.  He notes improvement in her mental status.  She is more alert today then when I saw her on Thursday- she is moving her R arm and bilateral toes on command.  [name] remains very hopeful for Alasia's complete recovery and perseverates on his belief in positive imagery and positive thinking, sharing stories from his athletic days. I asked him how he handled it when his team lost- he shared that they never lost, they only ran out of time. [name] played music for [place] and we discussed that research has shown music can be therapeutic.  [name] did not have questions or concerns today. He is agreeable to decision of continued aggressive care and proceeding with tracheostomy.     ROS  Length of Stay: 23  Current Medications: Scheduled Meds:  . amantadine  100 mg Per Tube BID  . amiodarone  200 mg Per Tube Daily  . aspirin  325 mg Per Tube Daily  . atorvastatin  40 mg Per Tube Daily  . B-complex with vitamin C  1 tablet Per Tube Daily  . chlorhexidine gluconate (MEDLINE KIT)  15 mL Mouth Rinse BID  . Chlorhexidine Gluconate Cloth  6 each Topical Q0600  . docusate  100 mg Per Tube BID  . feeding supplement (PRO-STAT SUGAR FREE 64)  30 mL Per Tube Daily  . heparin injection (subcutaneous)  5,000 Units Subcutaneous Q8H  . insulin  aspart  0-20 Units Subcutaneous Q4H  . insulin glargine  5 Units Subcutaneous BID  . mouth rinse  15 mL Mouth Rinse 10 times per day  . pantoprazole sodium  40 mg Per Tube QHS  . phenytoin (DILANTIN) IV  100 mg Intravenous Q8H  . polyethylene glycol  17 g Per Tube TID  . senna-docusate  1 tablet Per Tube BID  . sodium chloride flush  10-40 mL Intracatheter Q12H  Continuous Infusions: . sodium chloride Stopped (12/30/19 2139)  . sodium chloride    . sodium chloride    . dexmedetomidine (PRECEDEX) IV infusion Stopped (12/31/19 1428)  . feeding supplement (VITAL AF 1.2 CAL) 60 mL/hr at 01/06/20 0400  . levETIRAcetam Stopped (01/05/20 2150)  . norepinephrine (LEVOPHED) Adult infusion Stopped (12/28/19 0817)    PRN Meds: sodium chloride, sodium chloride, sodium chloride, acetaminophen, alteplase, bisacodyl, fentaNYL (SUBLIMAZE) injection, heparin, hydrALAZINE, lidocaine (PF), lidocaine-prilocaine, midazolam, ondansetron (ZOFRAN) IV, oxyCODONE, pentafluoroprop-tetrafluoroeth, senna-docusate, sodium chloride flush  Physical Exam          Vital Signs: BP (!) 137/56   Pulse 63   Temp 99.3 F (37.4 C) (Oral)   Resp (!) 32   Ht '5\' 4"'  (1.626 m)   Wt 62 kg   SpO2 100%   BMI 23.46 kg/m  SpO2: SpO2: 100 % O2 Device: O2 Device: Ventilator O2 Flow Rate: O2 Flow Rate (L/min): 4 L/min  Intake/output summary:   Intake/Output Summary (Last 24 hours) at 01/06/2020 1000 Last data filed at 01/06/2020 0800 Gross per 24 hour  Intake 1986 ml  Output 1619 ml  Net 367 ml   LBM: Last BM Date: 01/06/20 Baseline Weight: Weight: 61.3 kg Most recent weight: Weight: 62 kg       Palliative Assessment/Data: PPS: 10%      Patient Active Problem List   Diagnosis Date Noted  . Seizures ([place])   . Hemothorax   . Aspiration pneumonia ([place])   . Septic shock ([place])   . Goals of care, counseling/discussion   . Advanced care planning/counseling discussion   . Poor prognosis   . Palliative care by  specialist   . Endotracheally intubated   . Respiratory failure requiring intubation ([place])   . Pleural effusion on left   . Hx of CABG   . AKI (acute kidney injury) ([place])   . Hemorrhagic shock ([place])   . Pleural effusion 11/17/2019  . Labile blood pressure   . Essential hypertension   . Benign essential HTN   . Labile blood glucose   . Status post craniectomy   . Dysphagia, post-stroke   . Atrial fibrillation ([place])   . Left hemiparesis ([place])   . Cerebral edema ([place]) 11/20/2019  . ICH (intracerebral hemorrhage), SAH ([place]) result of ischemic stroke s/p crani 11/20/2019  . Dysphagia due to recent cerebral infarction 11/20/2019  . Acute blood loss anemia 11/20/2019  . Right middle cerebral artery stroke ([place]) 11/20/2019  . History of ETT   . Hypokalemia   . Acute respiratory failure ([place]) 11/02/2019  . Cerebrovascular accident (CVA) due to embolism of right middle cerebral artery ([place]) 10/31/2019  . Chronic venous insufficiency 10/07/2019  . Wound of RLE  08/18/2019  . Hyperlipidemia associated with type 2 diabetes mellitus ([place]) 09/25/2016  . Diastolic dysfunction without heart failure 06/02/2015  . Health care maintenance 03/24/2013  . Hypertension associated with diabetes ([place]) 10/16/2011  . Long term current use of anticoagulant therapy 08/27/2010  . DM (diabetes mellitus), type 2, uncontrolled ([place]) 02/11/2008  . MITRAL REGURGITATION - status post MVR 09/14/1999  . S/P MVR (mitral valve repair) 08/09/1999  . Atrial fibrillation with RVR ([place]) 08/03/1999  . S/P placement of cardiac pacemaker 07/18/1999    Palliative Care Assessment & Plan   Patient Profile: 64 y.o. female  with past medical history of rhemativ MV disease and prolapse s/p repair, chronic Afib s/p pacemaker placement, DM2, HTN, admitted initially on 4/16 with large R MCA acute ischemic  stroke- underwent percutaneous thrombectomy, had hemorrhagic transformation and underwent decompressive hemicraniectomy on 4/18  and was eventually discharged 5/6 to inpatient rehab with residual deficits of lethargy, dysphagia (d/c'd to CIR with Cortrak placed), R gaze preference, L side diminished muscle tone with LUE flaccidity. Patient had some progression in rehab- was able to sit up in wheelchair, using hemiwalker for limited mobility, dunking basketballs, speech was improving however significant cognitive impairments remained- notably her ability to take in oral nutrition was impaired by prolonged mastication and persistent oral residue, however no overt signs of aspiration. She developed SOB on 5/28 and was found to have pleural effusion on chest xray. PCCM conducted thoracentesis on 5/29 that yielded 1559m of bloody fluid. Shortness of breath persisted and CT scan showed obstruction of her L mainstem bronchus (possibly due to aspiration) with loculated pleural effusion and [facility].  She developed septic (r/t to aspiration pneumonia and hemothorax) shock and had worsening renal function and hypotension requiring levophed. Extubated on 6/4. 6/6 had episode of severe hypotension, altered mental status, reintubated, restarted on pressors- had large volume output of blood via chestube resulting in hemorrhagic shock. 6/6 had VATS and bronchoscopy to drain hemothorax and clear secretions. 6/6 CT scan head positive for new hemorrhages- no role for neurosurgery. CVVHD started on 6/11 due to progressive worsening renal function. 6/12 EEG noted seizures. She remains virtually unresponsive- spouse and RN did report some purposeful upper extremity movements 6/15. Palliative medicine consulted for assistance with goals of care.       Assessment/Recommendations/Plan   Continue current scope of care  MOlga Millersis not likely to agree to any limits being set to patient's care  PMT will follow along and continue to provide support to patient's family, relationship build and  assist with treatment related decisions  GOC are clear in that [place]riest [place]wishes for full scope care- although I worry that his goals of SAdamariwalking out of the hospital will be able to be achieved - this has been discussed with MOlga Millers I am hopeful that with transition to tracheostomy we can further assess Finola's ability to communicate and possibly involve her in future discussions   Goals of Care and Additional Recommendations:  Limitations on Scope of Treatment: Full Scope Treatment  Code Status:  Full code  Prognosis:   Unable to determine  Discharge Planning:  To Be Determined  Care plan was discussed with patient's spouse and Dr. STamala Julian  Thank you for allowing the Palliative Medicine Team to assist in the care of this patient.   Time In: 1500 Time Out: 1600 Total Time 60 mins Prolonged Time Billed Yes      Greater than 50%  of this time was spent counseling and coordinating care related to the above assessment and plan.  KMariana Kaufman AGNP-C Palliative Medicine   Please contact Palliative Medicine Team phone at [phone] questions and concerns.

## 2020-01-06 NOTE — Procedures (Signed)
Bronchoscopy Procedure Note Krystal Little [id number] 11/17/1955  Procedure: Bronchoscopy Indications: visualization for percutaneous trach   Procedure Details Consent: Risks of procedure as well as the alternatives and risks of each were explained to the (patient/caregiver).  Consent for procedure obtained. Time Out: Verified patient identification, verified procedure, site/side was marked, verified correct patient position, special equipment/implants available, medications/allergies/relevent history reviewed, required imaging and test results available.  Performed  In preparation for procedure, patient was given 100% FiO2 and bronchoscope lubricated. Sedation: Muscle relaxants and Etomidate  Airway entered and the following bronchi were examined: RUL, RML, RLL, LUL, LLL and Bronchi.   Procedures performed: none There was direct visualization of tracheostomy tube placement. No injury to the posterior wall.  Bronchoscope removed.    Evaluation Hemodynamic Status: BP stable throughout; O2 sats: stable throughout Patient's Current Condition: stable Specimens: None Complications: No apparent complications Patient did tolerate procedure well.   [name] [name] 01/06/2020

## 2020-01-06 NOTE — Plan of Care (Signed)

## 2020-01-06 NOTE — Progress Notes (Addendum)
NAME:  Krystal Little, MRN:  [id number], DOB:  1956/07/04, LOS: 72 ADMISSION DATE:  11/15/2019, CONSULTATION DATE:  5/29 REFERRING MD:  Letta Pate, CHIEF COMPLAINT:  Chest congestion   Brief History   1 female with an extensive cardiac history was admitted to [facility] in April in the setting of a right MCA stroke, had a right interventional radiology guided thrombectomy, later had significant brain edema and underwent craniectomy, ultimately transferred to inpatient rehab.  Pulmonary and critical care medicine was consulted on May 29 in the setting of tachypnea and an enlarging left-sided pleural effusion. Thoracentesis was performed which showed a hemothorax.  A CT chest was then ordered showing obstruction of her left mainstem bronchus and a loculated left pleural effusion.  On May 30 she was moved to the ICU for chest tube placement and bronchoscopy.  Underwent intrapleural TPA but required VATS eventually, course further complicated by ICH , seizures and AKI requiring CRRT  Past Medical History  Status post mitral valve repair 2001 Status post cardiac pacemaker 2001 History of sick sinus syndrome Hypertension Diabetes mellitus type 2 Chronic atrial fibrillation Right MCA stroke 2021  Significant Hospital Events   4/16 IR thrombectomy R MCA stroke 4/25 PCCM sign off 5/6 transfer to [facility] 5/29 PCCM consult left pleural effusion, thoracentesis 5/30 worsening tachypnea. Move to ICU for intubation to facilitate bronchoscopy and chest tube placement 5/31 TPA/ pulmonzyme started for 3 days for persistent left effusion 6/01 Afebrile, low-dose Levophed, Precedex, Minimal output on chest tube- pulmozyme / tpa x 2,  6/02 Weaned x 7 hours yesterday PSV 15/5, pulmozyme and TPA completed 3 days 6/03 afib with RVR placed on amio gtt overnight , 1U PRBC for Hb 7.1  6/05 extubated successfully, Repeat TPA/Pulmonzyme given again  6/06 acute decompensation overnight,  hemorrhagic shock > to OR for left VATS with 2L hemothorax drained.  CT head with new ICH> neurosurgery consulted 6/09 More awake but agitated on PSV weaning  6/12: Seizures + on EEG   Consults:  PCCM  Procedures:  5/29 Thoracentesis >> 1.5 L bloody fluid 5/30 ETT >  5/30 L pigtail chest tube >  Bronchoscopy 5/30 >>  large, thick, grey mucus plug completely occluding the left mainstem bronchus.  [facility] HD cath 6/12 >  Significant Diagnostic Tests:  Thoracentesis left chest 5/29 >> 8K WBC, 78% WBC, protein 4.0, LDH 182 CT chest abdomen pelvis 6/6 >> large left hemithorax, patchy right lung opacities with small right effusion.  No acute abdominal process CT head 6/6 >> evolution of right MCA infarct with hemorrhagic transformation with large intraparenchymal hematoma, 2 additional small hemorrhages in the inferior cerebellum and left temporal lobe.  Trace subarachnoid blood. CT Head 6/10 >> stable hemorrhagic R MCA.  Right hemi-craniectomy with no midline shift or mass effect.  Mildly regressed small cerebellar hemorrhages.  Stable small volume intraventricular hemorrhage, no ventriculomegaly.  Trace subarachnoid hemorrhage largely resolved.  6/12 EEG: right frontocentral seizures X2   Micro Data:   RVP 4/16 neg  MRSA PCR  4/17 neg   ======== 5/29 Left pleural fluid culture > negative 5/29 left pleural fluid cytology >> No malignant cells identified. Blood and acute inflammation.  5/30 BAL >> rare candida albicans 6/06 L Pleural Fluid >> negative 6/06 BCx2 >> negative   Antimicrobials:  Cefepime 4/24 >> 4/25 Zosyn 4/28 >> 5/4 Zosyn 5/30 >> 6/5 Cefepime 6/6 >> 6/12  Interim history/subjective:  No events. Minimal UoP. Bladder scans neg. Moving R arm purposefully.  Not following commands this AM. On PS. iHD yesterday.  Objective   Blood pressure 131/60, pulse 65, temperature 99.1 F (37.3 C), temperature source Oral, resp. rate (!) 26, height _0  (1.626 m), weight 62 kg,  SpO2 100 %. CVP:  [0 mmHg-17 mmHg] 6 mmHg  Vent Mode: PRVC FiO2 (%):  [30 %] 30 % Set Rate:  [20 bmp] 20 bmp Vt Set:  [430 mL] 430 mL PEEP:  [5 cmH20] 5 cmH20 Pressure Support:  [10 cmH20-12 cmH20] 10 cmH20 Plateau Pressure:  [16 cmH20-21 cmH20] 21 cmH20   Intake/Output Summary (Last 24 hours) at 01/06/2020 0803 Last data filed at 01/06/2020 0600 Gross per 24 hour  Intake 4766 ml  Output 4369 ml  Net 397 ml   Filed Weights   01/05/20 1300 01/05/20 1700 01/06/20 0500  Weight: 62.4 kg 61.7 kg 62 kg    Examination:  GEN: NAD HEENT: ETT in place, minimal secretions CV: RRR, ext warm PULM: Scattered transmitted upper airway sounds, requiring high PS to get volumes c/w muscular weakness GI: Soft, +BS, rectal tube in place EXT: Minimal anasarca NEURO: Moves R side but not to command today, L hemiparesis PSYCH: RASS 0 SKIN: No rashes  L chest tube in place with serosanguinous output BMP pending  Assessment & Plan:   Acute Respiratory Failure with Hypoxemia Mucus Plugging, Suspected Aspiration   Left loculated effusion complicated by hemothorax post VATS decortications - PS trials as able - Chest tube management per TCTS, appreciate help -Tracheostomy vs. Comfort care seems to be the only ways forward, husband still considering  AKI secondary to shock - no evidence of obstruction on renal US. 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   -No neurosurgical intervention -Supportive care  Status Epilepticus, likely related to underlying stroke history :EEG off 6/14 -Keppra and Dilantin per neuro, as needed Ativan for seizure-like activity  DM2 with Hyperglycemia -CBG, SSI plus Lantus 5 q 12  Chronic Atrial Fibrillation s/p Pacemaker -Telemetry -Anticoagulation held due to hemorrhagic CVA transformation, can  probably go with ppx dose this far out but full AC will be contraindicated for foreseeable future - Oral amiodarone  Moderate Protein Calorie Malnutrition  -Continue tube feeding  Goals of Care -Full code  Husband unrealistic regarding prognosis after CVA, palliative has been involved  Best practice:  Diet: TF  Pain/Anxiety/Delirium protocol (if indicated): fent prn VAP protocol (if indicated): yes DVT prophylaxis: Heparin subQ GI prophylaxis: PPI Glucose control: SSI Mobility: BR Code Status: full code Family Communication: will update when they come in Disposition:  ICU   The patient is critically ill with multiple organ systems failure and requires high complexity decision making for assessment and support, frequent evaluation and titration of therapies, application of advanced monitoring technologies and extensive interpretation of multiple databases. Critical Care Time devoted to patient care services described in this note independent of APP/resident time (if applicable)  is 32 minutes.   Erskine Emery MD [facility] 01/06/2020 8:03 AM Personal pager: [phone] If unanswered, please page CCM On-call: [phone]

## 2020-01-06 NOTE — Procedures (Signed)
Procedure: Percutaneous Tracheostomy CPT 31600 Performed by: Dr. Erskine Emery Bronchoscopy Assistant: Dr. Valeta Harms.  Indications: Chronic respiratory failure and need for ongoing mechanical ventilation.  Consent: Signed in chart  Preprocedure: Universal protocol was followed for this procedure. Timeout performed. Anterior neck prepped and draped.  Anesthesia: The patient was intubated and sedated prior to the procedure. Additional midazolam, etomidate, fentanyl and vecuronium were given for sedation and paralysis with close attention to vital signs throughout procedure.   Procedure: The patient was placed in the supine position. The anterior neck was prepped and draped in usual sterile fashion. 1% lidocaine was administered approximately 2 fingerbreadths above the sternal notch for local anesthesia. A 1.5-cm vertical incision was then performed 2 fingerbreadths above the sternal notch. Using a curved Kelly, blunt dissection was performed down to the level of the pretracheal fascia. At this point, the bronchoscope was introduced through the endotracheal tube and the trachea was properly visualized. The endotracheal tube was then gradually withdrawn within the trachea under direct bronchoscopic visualization. Proper midline position was confirmed by bouncing the needle from the tracheostomy tray over the trachea with bronchoscopic examination. The needle was advanced into the trachea and proper positioning was confirmed with direct visualization. The needle was then removed leaving a white outer cannula in position. The wire from the tracheostomy tray was then advanced through the white outer cannula. The cannula was then removed. The small, blue dilator was then advanced over the wire into the trachea. Once proper dilatation was achieved, the dilator was removed. The large, tapered dilator was then advanced over the wire into the trachea. The dilator was removed leaving the wire and white inner cannula in  position. A number 6-0 percutaneous Shiley tracheostomy tube was then advanced over the wire and white inner cannula into the trachea. Proper positioning was confirmed with bronchoscopic visualization. The tracheostomy tube was then sutured in place with four nylon sutures. It was further secured with a tracheostomy tie.  Estimated blood loss: Less than 5 mL.  Complications: None immediate.  CXR ordered.

## 2020-01-06 NOTE — Progress Notes (Signed)
[facility] Progress Note    Assessment/ Plan:    Acute kidney injury, oliguric ischemic ATN secondary to hemorrhagic shock requiring pressor support.  Refractory to IV diuretics initiated on CRRT 12/26/2019- 6/20.  First IHD 6/21.  More urine has been charted but it looks like this was CBI output.  This has now been stopped, will d/c Foley, bladder scan, and follow for renal recovery (bladder scans have been neg so far).  Will write IHD orders for second shift tomorrow.  Please note that she is a very poor long term candidate for hemodialysis.   L sided hemothorax: s/p VATS with thoracic surgery 6/6.  S/p removal of one chest tube 6/21.   R MCA stroke: s/p thrombectomy 8/33 complicated by worsening cerebral edema with mass effect s/p decompressive right hemicraniectomy and placement of bone flap on 11/02/19.  Had new ICH 6/6.    Acute hypoxic RF: per PCCM.  Appears that she's been weaning on the vent the past 2 days.    Chronic atrial fibrillation: on amiodarone and full ASA   Seizures: continues Dilantin and Keppra per neuro   Active discussions with palliative medicine involved.   Subjective:    Tolerated IHD yesterday.  R arm movement, not to command but looks purposeful   Objective:   BP (!) 137/56    Pulse 63    Temp 99.3 F (37.4 C) (Oral)    Resp (!) 32    Ht _0  (1.626 m)    Wt 62 kg    SpO2 100%    BMI 23.46 kg/m   Intake/Output Summary (Last 24 hours) at 01/06/2020 1008 Last data filed at 01/06/2020 0800 Gross per 24 hour  Intake 1986 ml  Output 1419 ml  Net 567 ml   Weight change: 1.8 kg  Physical Exam: Gen: intubated, unresponsive HEENT: R hemicraniectomy  CVS: RRR Resp: clear bilaterally Abd: soft Ext: no LE edema ACCESS: L IJ nontunneled HD cath  Imaging: DG Chest Port 1 View  Result Date: 01/06/2020 CLINICAL DATA:  Endotracheal tube present EXAM: PORTABLE CHEST 1 VIEW COMPARISON:  01/03/2020 FINDINGS: Endotracheal tube, left IJ  and PICC lines, partially imaged enteric tube, and left apical chest tube again noted. No pneumothorax. Similar appearance of partially loculated left pleural effusion. Probable underlying patchy left lung atelectasis. Improving aeration at the right lung base. Stable cardiomediastinal contours.  Right chest wall pacemaker. IMPRESSION: 1. Improving aeration at the right lung base. Similar appearance of partially loculated left pleural effusion and probable underlying patchy atelectasis. 2. Stable lines and tubes. Electronically Signed   By: Macy Mis M.D.   On: 01/06/2020 09:07    Labs: BMET Recent Labs  Lab 01/02/20 0426 01/02/20 0426 01/02/20 1535 01/03/20 0359 01/03/20 1620 01/04/20 0427 01/05/20 0412 01/05/20 1832 01/06/20 0824  NA 134*   < > 135 132* 134* 132* 133* 135 134*  K 4.7   < > 4.6 4.4 4.8 4.5 5.8* 3.2* 3.6  CL 100   < > 101 100 101 99 100 99 96*  CO2 25   < > _1 GLUCOSE 144*   < > 169* 180* 110* 125* 187* 138* 154*  BUN 20   < > _2 49* 17 33*  CREATININE 1.13*   < > 1.06* 1.02* 1.04* 0.94 2.23* 1.16* 2.23*  CALCIUM 7.8*   < > 7.4* 7.3* 7.4* 7.4* 7.2* 7.4* 7.4*  PHOS 2.1*  --  2.7  1.7* 3.7 2.0* 2.3* 1.7*  --    < > = values in this interval not displayed.   CBC Recent Labs  Lab 01/04/20 0427 01/05/20 0412 01/05/20 1832 01/06/20 0437  WBC 10.8* 13.3* 10.2 8.9  HGB 8.3* 8.4* 7.5* 7.1*  HCT 27.7* 26.9* 24.2* 23.1*  MCV 100.7* 98.2 97.2 97.9  PLT 67* 98* 85* 93*    Medications:     amantadine  100 mg Per Tube BID   amiodarone  200 mg Per Tube Daily   aspirin  325 mg Per Tube Daily   atorvastatin  40 mg Per Tube Daily   B-complex with vitamin C  1 tablet Per Tube Daily   chlorhexidine gluconate (MEDLINE KIT)  15 mL Mouth Rinse BID   Chlorhexidine Gluconate Cloth  6 each Topical Q0600   docusate  100 mg Per Tube BID   feeding supplement (PRO-STAT SUGAR FREE 64)  30 mL Per Tube Daily   heparin injection  (subcutaneous)  5,000 Units Subcutaneous Q8H   insulin aspart  0-20 Units Subcutaneous Q4H   insulin glargine  5 Units Subcutaneous BID   mouth rinse  15 mL Mouth Rinse 10 times per day   pantoprazole sodium  40 mg Per Tube QHS   phenytoin (DILANTIN) IV  100 mg Intravenous Q8H   polyethylene glycol  17 g Per Tube TID   senna-docusate  1 tablet Per Tube BID   sodium chloride flush  10-40 mL Intracatheter Q12H      Madelon Lips, MD 01/06/2020, 10:08 AM

## 2020-01-07 LAB — GLUCOSE, CAPILLARY
Glucose-Capillary: 122 mg/dL — ABNORMAL HIGH (ref 70–99)
Glucose-Capillary: 128 mg/dL — ABNORMAL HIGH (ref 70–99)
Glucose-Capillary: 138 mg/dL — ABNORMAL HIGH (ref 70–99)
Glucose-Capillary: 150 mg/dL — ABNORMAL HIGH (ref 70–99)
Glucose-Capillary: 158 mg/dL — ABNORMAL HIGH (ref 70–99)
Glucose-Capillary: 173 mg/dL — ABNORMAL HIGH (ref 70–99)
Glucose-Capillary: 72 mg/dL (ref 70–99)

## 2020-01-07 LAB — BASIC METABOLIC PANEL
Anion gap: 14 (ref 5–15)
BUN: 57 mg/dL — ABNORMAL HIGH (ref 8–23)
CO2: 24 mmol/L (ref 22–32)
Calcium: 7.5 mg/dL — ABNORMAL LOW (ref 8.9–10.3)
Chloride: 95 mmol/L — ABNORMAL LOW (ref 98–111)
Creatinine, Ser: 3.24 mg/dL — ABNORMAL HIGH (ref 0.44–1.00)
GFR calc Af Amer: 17 mL/min — ABNORMAL LOW (ref >60)
GFR calc non Af Amer: 14 mL/min — ABNORMAL LOW (ref >60)
Glucose, Bld: 154 mg/dL — ABNORMAL HIGH (ref 70–99)
Potassium: 4.2 mmol/L (ref 3.5–5.1)
Sodium: 133 mmol/L — ABNORMAL LOW (ref 135–145)

## 2020-01-07 LAB — CBC
HCT: 23.3 % — ABNORMAL LOW (ref 36.0–46.0)
Hemoglobin: 7.1 g/dL — ABNORMAL LOW (ref 12.0–15.0)
MCH: 30.5 pg (ref 26.0–34.0)
MCHC: 30.5 g/dL (ref 30.0–36.0)
MCV: 100 fL (ref 80.0–100.0)
Platelets: 99 10*3/uL — ABNORMAL LOW (ref 150–400)
RBC: 2.33 MIL/uL — ABNORMAL LOW (ref 3.87–5.11)
RDW: 23.3 % — ABNORMAL HIGH (ref 11.5–15.5)
WBC: 7.9 10*3/uL (ref 4.0–10.5)
nRBC: 0 % (ref 0.0–0.2)

## 2020-01-07 LAB — FERRITIN: Ferritin: 1261 ng/mL — ABNORMAL HIGH (ref 11–307)

## 2020-01-07 LAB — TYPE AND SCREEN
ABO/RH(D): O POS
Antibody Screen: NEGATIVE

## 2020-01-07 LAB — IRON AND TIBC
Iron: 44 ug/dL (ref 28–170)
Saturation Ratios: 22 % (ref 10.4–31.8)
TIBC: 200 ug/dL — ABNORMAL LOW (ref 250–450)
UIBC: 156 ug/dL

## 2020-01-07 LAB — MAGNESIUM: Magnesium: 2.4 mg/dL (ref 1.7–2.4)

## 2020-01-07 LAB — PHOSPHORUS: Phosphorus: 4.9 mg/dL — ABNORMAL HIGH (ref 2.5–4.6)

## 2020-01-07 MED ORDER — RENA-VITE PO TABS
1.0000 | ORAL_TABLET | Freq: Every day | ORAL | Status: DC
  Administered 2020-01-07 – 2020-01-12 (×6): 1 via ORAL
  Filled 2020-01-07 (×6): qty 1

## 2020-01-07 NOTE — Progress Notes (Signed)
PT Cancellation Note  Patient Details Name: Krystal Little MRN: [id number] DOB: 1955/12/11   Cancelled Treatment:    Reason Eval/Treat Not Completed: Patient at procedure or test/unavailable (HD)   Keefer Soulliere B Samanyu Tinnell 01/07/2020, 11:26 AM  Bayard Males, PT Acute Rehabilitation Services Pager: [phone] Office: [phone]

## 2020-01-07 NOTE — Progress Notes (Signed)
[facility] Progress Note    Assessment/ Plan:    Acute kidney injury, oliguric ischemic ATN secondary to hemorrhagic shock requiring pressor support.  Refractory to IV diuretics initiated on CRRT 12/26/2019- 6/20.  First IHD 6/21.  More urine has been charted but it looks like this was CBI output.  This has now been stopped, will d/c Foley, bladder scan, and follow for renal recovery (bladder scans have been neg so far).  IHD today 6/23.  Please note that she is a very poor long term candidate for hemodialysis; if full scope of care is desired and we move towards LTACH will need to get a tunneled HD cath.   L sided hemothorax: s/p VATS with thoracic surgery 6/6.  S/p removal of one chest tube 6/21.     R MCA stroke: s/p thrombectomy 5/79 complicated by worsening cerebral edema with mass effect s/p decompressive right hemicraniectomy and placement of bone flap on 11/02/19.  Had new ICH 6/6.    Acute hypoxic RF: per PCCM.  Appears that she's been weaning on the vent the past 2 days.    Chronic atrial fibrillation: on amiodarone and full ASA   Seizures: continues Dilantin and Keppra per neuro   Active discussions with palliative medicine involved.   Subjective:    S/p trach yesterday.  About to start dialysis on my exam.  More awake, opens eyes to name.     Objective:   BP (!) 103/45   Pulse 67   Temp 99.3 F (37.4 C)   Resp (!) 28   Ht '5\' 4"'  (1.626 m)   Wt 63.1 kg   SpO2 100%   BMI 23.88 kg/m   Intake/Output Summary (Last 24 hours) at 01/07/2020 1118 Last data filed at 01/06/2020 2000 Gross per 24 hour  Intake 367.51 ml  Output 20 ml  Net 347.51 ml   Weight change: 0.3 kg  Physical Exam: Gen: awake, opens eyes to name HEENT: R hemicraniectomy, trach in place, c/d/i CVS: RRR Resp: clear bilaterally Abd: soft Ext: no LE edema ACCESS: L IJ nontunneled HD cath  Imaging: DG Chest Port 1 View  Result Date: 01/06/2020 CLINICAL DATA:  Tracheostomy  tube placement EXAM: PORTABLE CHEST 1 VIEW COMPARISON:  Earlier same day FINDINGS: 1646 hours. Tracheostomy tube visualized in situ with tip position approximately 4.8 cm above the base of the carina. A feeding tube passes into the stomach although the distal tip position is not included on the film. Left IJ central line tip overlies the mid SVC level. Left chest tube remains in place without evidence for left-sided pneumothorax. Pleuroparenchymal opacity at the lower left hemithorax is stable with left pleural effusion. The cardio pericardial silhouette is enlarged. Diffuse interstitial coarsening noted right lung. Right permanent pacemaker evident. Telemetry leads overlie the chest. IMPRESSION: 1. New tracheostomy tube with tip position approximately 4.8 cm above the base of the carina. 2. No substantial interval change in exam. Left chest tube remains in place without evidence for left-sided pneumothorax. 3. Pleuroparenchymal opacity at the lower left hemithorax with left pleural effusion. Electronically Signed   By: Misty Stanley M.D.   On: 01/06/2020 17:18   DG Chest Port 1 View  Result Date: 01/06/2020 CLINICAL DATA:  Endotracheal tube present EXAM: PORTABLE CHEST 1 VIEW COMPARISON:  01/03/2020 FINDINGS: Endotracheal tube, left IJ and PICC lines, partially imaged enteric tube, and left apical chest tube again noted. No pneumothorax. Similar appearance of partially loculated left pleural effusion. Probable underlying patchy left lung  atelectasis. Improving aeration at the right lung base. Stable cardiomediastinal contours.  Right chest wall pacemaker. IMPRESSION: 1. Improving aeration at the right lung base. Similar appearance of partially loculated left pleural effusion and probable underlying patchy atelectasis. 2. Stable lines and tubes. Electronically Signed   By: Macy Mis M.D.   On: 01/06/2020 09:07    Labs: BMET Recent Labs  Lab 01/02/20 1535 01/02/20 1535 01/03/20 0359 01/03/20 1620  01/04/20 0427 01/05/20 0412 01/05/20 1832 01/06/20 0824 01/07/20 0419  NA 135   < > 132* 134* 132* 133* 135 134* 133*  K 4.6   < > 4.4 4.8 4.5 5.8* 3.2* 3.6 4.2  CL 101   < > 100 101 99 100 99 96* 95*  CO2 26   < > '25 26 26 22 27 27 24  ' GLUCOSE 169*   < > 180* 110* 125* 187* 138* 154* 154*  BUN 21   < > '20 22 21 ' 49* 17 33* 57*  CREATININE 1.06*   < > 1.02* 1.04* 0.94 2.23* 1.16* 2.23* 3.24*  CALCIUM 7.4*   < > 7.3* 7.4* 7.4* 7.2* 7.4* 7.4* 7.5*  PHOS 2.7  --  1.7* 3.7 2.0* 2.3* 1.7*  --  4.9*   < > = values in this interval not displayed.   CBC Recent Labs  Lab 01/05/20 0412 01/05/20 1832 01/06/20 0437 01/07/20 0419  WBC 13.3* 10.2 8.9 7.9  HGB 8.4* 7.5* 7.1* 7.1*  HCT 26.9* 24.2* 23.1* 23.3*  MCV 98.2 97.2 97.9 100.0  PLT 98* 85* 93* 99*    Medications:    . amantadine  100 mg Per Tube BID  . amiodarone  200 mg Per Tube Daily  . aspirin  325 mg Per Tube Daily  . atorvastatin  40 mg Per Tube Daily  . B-complex with vitamin C  1 tablet Per Tube Daily  . chlorhexidine gluconate (MEDLINE KIT)  15 mL Mouth Rinse BID  . Chlorhexidine Gluconate Cloth  6 each Topical Q0600  . docusate  100 mg Per Tube BID  . feeding supplement (PRO-STAT SUGAR FREE 64)  30 mL Per Tube Daily  . heparin injection (subcutaneous)  5,000 Units Subcutaneous Q8H  . insulin aspart  0-20 Units Subcutaneous Q4H  . insulin glargine  5 Units Subcutaneous BID  . mouth rinse  15 mL Mouth Rinse 10 times per day  . pantoprazole sodium  40 mg Per Tube QHS  . phenytoin (DILANTIN) IV  100 mg Intravenous Q8H  . polyethylene glycol  17 g Per Tube TID  . propofol  500 mg Intravenous Once  . senna-docusate  1 tablet Per Tube BID  . sodium chloride flush  10-40 mL Intracatheter Q12H      Madelon Lips, MD 01/07/2020, 11:18 AM

## 2020-01-07 NOTE — Progress Notes (Signed)
SLP Cancellation Note  Patient Details Name: Krystal Little MRN: [id number] DOB: 04-29-1956   Cancelled treatment:       Reason Eval/Treat Not Completed: Other (comment). Pt is currently on the vent. Case discussed with Estill Bamberg, RN and she indicated that the pt has been weaning since last night with hopes of her soon being able to tolerate trach collar. SLP will defer inline PMV and follow up.  Yanai Hobson I. [name], [place], [place] Office number [phone] Pager [place] 01/07/2020, 2:42 PM

## 2020-01-07 NOTE — Progress Notes (Signed)
Nutrition Follow-up  DOCUMENTATION CODES:   Not applicable  INTERVENTION:   Recommend long term feeding tube if diet unable to advance in near future  Continue tube feeding:   -Vital AF 1.2 to 60 ml/hr via Cortrak (1440 ml) -Pro-Stat 30 mL daily -Transition b complex to renal MVI  Provides 1828 kcals, 112 g of protein and 1094 mL of free water Meets 100% estimated calorie and protein needs  NUTRITION DIAGNOSIS:   Inadequate oral intake related to acute illness as evidenced by NPO status.  Ongoing  GOAL:   Patient will meet greater than or equal to 90% of their needs  Met via TF  MONITOR:   Vent status, TF tolerance, Labs, Weight trends  REASON FOR ASSESSMENT:   Consult, Ventilator Enteral/tube feeding initiation and management  ASSESSMENT:   64 yo female admitted from [place] after being at [facility] in April in setting of R. MCA stroke requiring thrombectomy, developed significant brain edema requiring craniectomy. Pt developed L pleural effusion on Rehab requiring thoracentesis and ultimately required intubation due to resp failure from occlusion of L. Mainstem bronchus and large L hemothorax, bronch and chest tube placement. Pt did require Cortrak placement with TF initially but was taking po prior to readmission from Rehab. PMH DM, HTN, R. MCA stroke, SSS  5/29 Thoracentesis 5/30 Intubated, Chest tube 6/04 Cortrak placed 6/06 Left VATS with drainage of left hemothorax 6/11 CRRT initiated 6/12 Seizures+ on EEG 6/20 CRRT stopped 6/21 First iHD 6/22 Trach placement   Pt discussed during ICU rounds and with RN.   Attempting trach collar today. Noted as poor long term candidate for HD. Will need tunneled cath upon d/c if full scope is desired. Tolerating tube feeding at goal rate. Pt would benefit from long term feeding tube as she is day 24 with Cortrak. MD holding off for now to see how she does with trach collar. Awaiting insurance approval for LTAC placement.     Admission weight: 61.3 kg  Current weight: 61.6 kg   I/O: -16,297 ml since 6/9  HD today: 1,500 ml net UF Chest tube: 20 ml x 24 hrs   Medications: b complex with vitamin C, colace, SS novolog, lantus, miralax, senokot Labs: Na 133 (L) Phosphorus 4.9 (wdl for HD) CBG 72-173  Diet Order:   Diet Order            Diet NPO time specified  Diet effective midnight                 EDUCATION NEEDS:   Not appropriate for education at this time  Skin:  Skin Assessment: Skin Integrity Issues: Skin Integrity Issues:: Incisions, Other (Comment) Incisions: incision to groin, head, abdomen Other: fissure to mid coccyx, venous stasis ulcers on legs, MASD to buttock/sacrum  Last BM:  6/23  Height:   Ht Readings from Last 1 Encounters:  12/01/2019 _0  (1.626 m)    Weight:   Wt Readings from Last 1 Encounters:  01/07/20 61.6 kg    BMI:  Body mass index is 23.31 kg/m.  Estimated Nutritional Needs:   Kcal:  1525-1830 kcals  Protein:  90-120 g  Fluid:  >/= 1.6 L   Mariana Single RD, LDN Clinical Nutrition Pager listed in [place]

## 2020-01-07 NOTE — TOC Initial Note (Signed)
Transition of Care [facility]) - Initial/Assessment Note    Patient Details  Name: Krystal Little MRN: [id number] Date of Birth: [birth year]  Transition of Care [facility]) CM/SW Contact:    Curlene Labrum, RN Phone Number: 01/07/2020, 12:24 PM  Clinical Narrative:                 Case management spoke with the husband at the bedside to offer Medicare choice regarding LTAC care.  I explained the services and care offered through [facility] and the husband was receptive and prefers 1. [facility] and then 2. [facility].  I called ERica, CM with [facility] and she is checking on bed availability regarding open beds for LTAC.  Expected Discharge Plan: Long Term Acute Care (LTAC) Barriers to Discharge: Continued Medical Work up (Patient received trach yesterday and is starting trach trials today.  No PEG at this time.)   Patient Goals and CMS Choice Patient states their goals for this hospitalization and ongoing recovery are:: Patient's husband is open to patient's transfer to [facility] CMS Medicare.gov Compare Post Acute Care list provided to:: Patient Represenative (must comment) (husband) Choice offered to / list presented to : Spouse  Expected Discharge Plan and Services Expected Discharge Plan: Long Term Acute Care (LTAC)   Discharge Planning Services: CM Consult Post Acute Care Choice: Long Term Acute Care (LTAC) (Prefers 1. [facility] and 2. [facility]) Living arrangements for the past 2 months: Single Family Home                                      Prior Living Arrangements/Services Living arrangements for the past 2 months: [place] with:: Spouse Patient language and need for interpreter reviewed:: Yes Do you feel safe going back to the place where you live?: No   Patient unable to return home at this time due to total care needs and HD.  Need for Family Participation in Patient Care: Yes (Comment) Care giver support system in place?: Yes (comment)    Criminal Activity/Legal Involvement Pertinent to Current Situation/Hospitalization: No - Comment as needed  Activities of Daily Living Home Assistive Devices/Equipment: Brace (specify type) (L wrist) ADL Screening (condition at time of admission) Patient's cognitive ability adequate to safely complete daily activities?: Yes Is the patient deaf or have difficulty hearing?: No Does the patient have difficulty seeing, even when wearing glasses/contacts?: No Does the patient have difficulty concentrating, remembering, or making decisions?: No Patient able to express need for assistance with ADLs?: Yes Does the patient have difficulty dressing or bathing?: Yes Independently performs ADLs?: No Communication: Independent Dressing (OT): Needs assistance Is this a change from baseline?: Change from baseline, expected to last >3 days Grooming: Needs assistance Is this a change from baseline?: Change from baseline, expected to last >3 days Feeding: Needs assistance Is this a change from baseline?: Change from baseline, expected to last >3 days Bathing: Needs assistance Is this a change from baseline?: Change from baseline, expected to last >3 days Toileting: Needs assistance Is this a change from baseline?: Change from baseline, expected to last >3days In/Out Bed: Needs assistance Is this a change from baseline?: Change from baseline, expected to last >3 days Walks in Home: Needs assistance Is this a change from baseline?: Change from baseline, expected to last >3 days Does the patient have difficulty walking or climbing stairs?: No Weakness of Legs: Both (L more than  R) Weakness of Arms/Hands: Left  Permission Sought/Granted Permission sought to share information with : Case Manager Permission granted to share information with : Yes, Verbal Permission Granted     Permission granted to share info w AGENCY: [facility]        Emotional Assessment Appearance:: Appears stated  age Attitude/Demeanor/Rapport: Unresponsive Affect (typically observed): Quiet Orientation: :  (No responsive at this time to stimuli) Alcohol / Substance Use: Not Applicable Psych Involvement: No (comment)  Admission diagnosis:  Pleural effusion on left [J90] Patient Active Problem List   Diagnosis Date Noted  . Acute respiratory failure with hypoxemia ([place])   . Seizures ([place])   . Hemothorax   . Aspiration pneumonia ([place])   . Septic shock ([place])   . Goals of care, counseling/discussion   . Advanced care planning/counseling discussion   . Poor prognosis   . Palliative care by specialist   . Endotracheally intubated   . Respiratory failure requiring intubation ([place])   . Pleural effusion on left   . Hx of CABG   . AKI (acute kidney injury) ([place])   . Hemorrhagic shock ([place])   . Pleural effusion 11/23/2019  . Labile blood pressure   . Essential hypertension   . Benign essential HTN   . Labile blood glucose   . Status post craniectomy   . Dysphagia, post-stroke   . Atrial fibrillation ([place])   . Left hemiparesis ([place])   . Cerebral edema ([place]) 11/20/2019  . ICH (intracerebral hemorrhage), SAH ([place]) result of ischemic stroke s/p crani 11/20/2019  . Dysphagia due to recent cerebral infarction 11/20/2019  . Acute blood loss anemia 11/20/2019  . 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 History of ETT   . Hypokalemia   . Acute respiratory failure ([place]) 11/02/2019  . Cerebrovascular accident (CVA) due to embolism of right middle cerebral artery ([place]) 10/31/2019  . Chronic venous insufficiency 10/07/2019  . Wound of RLE  08/18/2019  . Hyperlipidemia associated with type 2 diabetes mellitus ([place]) 09/25/2016  . Diastolic dysfunction without heart failure 06/02/2015  . Health care maintenance 03/24/2013  . Hypertension associated with diabetes ([place]) 10/16/2011  . Long term current use of anticoagulant therapy 08/27/2010  . DM (diabetes mellitus), type 2, uncontrolled  ([place]) 02/11/2008  . MITRAL REGURGITATION - status post MVR 09/14/1999  . S/P MVR (mitral valve repair) 08/09/1999  . Atrial fibrillation with RVR ([place]) 08/03/1999  . S/P placement of cardiac pacemaker 07/18/1999   PCP:  Stark Klein, MD Pharmacy:   [facility] [place], [place] [address] Phone: [phone] Fax: [phone]  [facility] [place], [place] [address] [place] [address] Phone: [phone] Fax: [phone]     Social Determinants of Health (SDOH) Interventions    Readmission Risk Interventions Readmission Risk Prevention Plan 01/07/2020  Transportation Screening Complete  Medication Review ([place]) Complete  PCP or Specialist appointment within 3-5 days of discharge Complete  HRI or Home Care Consult Complete  SW Recovery Care/Counseling Consult Complete  Palliative Care Screening Complete  Skilled Nursing Facility Complete  Some recent data might be hidden

## 2020-01-07 NOTE — Progress Notes (Signed)
NAME:  Krystal Little, MRN:  [id number], DOB:  July 16, 1956, LOS: 24 ADMISSION DATE:  11/15/2019, CONSULTATION DATE:  5/29 REFERRING MD:  Letta Pate, CHIEF COMPLAINT:  Chest congestion   Brief History   8 female with an extensive cardiac history was admitted to [facility] in April in the setting of a right MCA stroke, had a right interventional radiology guided thrombectomy, later had significant brain edema and underwent craniectomy, ultimately transferred to inpatient rehab.  Pulmonary and critical care medicine was consulted on May 29 in the setting of tachypnea and an enlarging left-sided pleural effusion. Thoracentesis was performed which showed a hemothorax.  A CT chest was then ordered showing obstruction of her left mainstem bronchus and a loculated left pleural effusion.  On May 30 she was moved to the ICU for chest tube placement and bronchoscopy.  Underwent intrapleural TPA but required VATS eventually, course further complicated by ICH , seizures and AKI requiring CRRT  Past Medical History  Status post mitral valve repair 2001 Status post cardiac pacemaker 2001 History of sick sinus syndrome Hypertension Diabetes mellitus type 2 Chronic atrial fibrillation Right MCA stroke 2021  Significant Hospital Events   4/16 IR thrombectomy R MCA stroke 4/25 PCCM sign off 5/6 transfer to [facility] 5/29 PCCM consult left pleural effusion, thoracentesis 5/30 worsening tachypnea. Move to ICU for intubation to facilitate bronchoscopy and chest tube placement 5/31 TPA/ pulmonzyme started for 3 days for persistent left effusion 6/01 Afebrile, low-dose Levophed, Precedex, Minimal output on chest tube- pulmozyme / tpa x 2,  6/02 Weaned x 7 hours yesterday PSV 15/5, pulmozyme and TPA completed 3 days 6/03 afib with RVR placed on amio gtt overnight , 1U PRBC for Hb 7.1  6/05 extubated successfully, Repeat TPA/Pulmonzyme given again  6/06 acute decompensation overnight,  hemorrhagic shock > to OR for left VATS with 2L hemothorax drained.  CT head with new ICH> neurosurgery consulted 6/09 More awake but agitated on PSV weaning  6/12: Seizures + on EEG   Consults:  PCCM  Procedures:  5/29 Thoracentesis >> 1.5 L bloody fluid 5/30 ETT >  5/30 L pigtail chest tube >  Bronchoscopy 5/30 >>  large, thick, grey mucus plug completely occluding the left mainstem bronchus.  [facility] HD cath 6/12 >  Significant Diagnostic Tests:  Thoracentesis left chest 5/29 >> 8K WBC, 78% WBC, protein 4.0, LDH 182 CT chest abdomen pelvis 6/6 >> large left hemithorax, patchy right lung opacities with small right effusion.  No acute abdominal process CT head 6/6 >> evolution of right MCA infarct with hemorrhagic transformation with large intraparenchymal hematoma, 2 additional small hemorrhages in the inferior cerebellum and left temporal lobe.  Trace subarachnoid blood. CT Head 6/10 >> stable hemorrhagic R MCA.  Right hemi-craniectomy with no midline shift or mass effect.  Mildly regressed small cerebellar hemorrhages.  Stable small volume intraventricular hemorrhage, no ventriculomegaly.  Trace subarachnoid hemorrhage largely resolved.  6/12 EEG: right frontocentral seizures X2   Micro Data:   RVP 4/16 neg  MRSA PCR  4/17 neg   ======== 5/29 Left pleural fluid culture > negative 5/29 left pleural fluid cytology >> No malignant cells identified. Blood and acute inflammation.  5/30 BAL >> rare candida albicans 6/06 L Pleural Fluid >> negative 6/06 BCx2 >> negative   Antimicrobials:  Cefepime 4/24 >> 4/25 Zosyn 4/28 >> 5/4 Zosyn 5/30 >> 6/5 Cefepime 6/6 >> 6/12  Interim history/subjective:  Awake, following commands on R.  Objective   Blood pressure  132/60, pulse 62, temperature 98.9 F (37.2 C), temperature source Oral, resp. rate (!) 22, height _0  (1.626 m), weight 62.7 kg, SpO2 100 %.    Vent Mode: PRVC FiO2 (%):  [30 %] 30 % Set Rate:  [20 bmp] 20 bmp Vt Set:   [430 mL] 430 mL PEEP:  [5 cmH20] 5 cmH20 Pressure Support:  [10 cmH20] 10 cmH20 Plateau Pressure:  [20 cmH20-119 cmH20] 21 cmH20   Intake/Output Summary (Last 24 hours) at 01/07/2020 0746 Last data filed at 01/06/2020 2000 Gross per 24 hour  Intake 682.51 ml  Output 20 ml  Net 662.51 ml   Filed Weights   01/05/20 1700 01/06/20 0500 01/07/20 0500  Weight: 61.7 kg 62 kg 62.7 kg    Examination:  GEN: NAD HEENT:trach in place, minimal secretions CV: RRR, ext warm PULM: Scattered transmitted upper airway sounds, no accessory muscle use GI: Soft, +BS, rectal tube in place EXT: Minimal anasarca NEURO: Moves R side to command today, L hemiparesis PSYCH: RASS 0 SKIN: No rashes  L chest tube in place with serosanguinous output H/H stable Cr rising No urine op  Assessment & Plan:   Acute Respiratory Failure with Hypoxemia- trach 6/22 Mucus Plugging, Suspected Aspiration   Left loculated effusion complicated by hemothorax post VATS decortications - PS this AM, then HD, then TC - VAP prevention bundle - Trach sutures out 6/27 - Chest tube can probably come out later today  AKI secondary to shock - no evidence of obstruction on renal US. 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   -No neurosurgical intervention -Supportive care  Status Epilepticus, likely related to underlying stroke history :EEG off 6/14 -Keppra and Dilantin per neuro, as needed Ativan for seizure-like activity  DM2 with Hyperglycemia -CBG, SSI plus Lantus 5 q 12  Chronic Atrial Fibrillation s/p Pacemaker -Telemetry -Anticoagulation held due to hemorrhagic CVA transformation, can probably go with ppx dose this far out but full AC will be contraindicated for foreseeable future - Oral amiodarone  Moderate Protein Calorie Malnutrition   -Continue tube feeding  Goals of Care -Full code  GOC: full scope  Best practice:  Diet: TF  Pain/Anxiety/Delirium protocol (if indicated): fent prn VAP protocol (if indicated): yes DVT prophylaxis: Heparin subQ GI prophylaxis: PPI Glucose control: SSI Mobility: BR Code Status: full code Family Communication: will update when they come in Disposition:  ICU  The patient is critically ill with multiple organ systems failure and requires high complexity decision making for assessment and support, frequent evaluation and titration of therapies, application of advanced monitoring technologies and extensive interpretation of multiple databases. Critical Care Time devoted to patient care services described in this note independent of APP/resident time (if applicable)  is 33 minutes.   Erskine Emery MD [place] [facility] 01/07/2020 7:46 AM Personal pager: [phone] If unanswered, please page CCM On-call: [phone]

## 2020-01-07 NOTE — Progress Notes (Signed)
TCTS DAILY ICU PROGRESS NOTE                   [address]            [place] [address]          [phone]   17 Days Post-Op Procedure(s) (LRB): Video Assisted Thoracoscopy (Vats) left , Drainage of Hemothorax (Left) Video Bronchoscopy (N/A)  Total Length of Stay:  LOS: 24 days   Subjective: Patient had percutaneous tracheostomy yesterday Left pleural tube drainage is sanguinous and remains minimal Chest x-ray after percutaneous tracheostomy shows possible increase in left pleural effusion so we will leave chest tube in place for now  Objective: Vital signs in last 24 hours: Temp:  [98.1 F (36.7 C)-99.3 F (37.4 C)] 99.3 F (37.4 C) (06/23 0757) Pulse Rate:  [59-72] 64 (06/23 0800) Cardiac Rhythm: Atrial fibrillation;Ventricular paced (06/23 0800) Resp:  [20-58] 33 (06/23 0800) BP: (67-163)/(42-75) 153/60 (06/23 0822) SpO2:  [100 %] 100 % (06/23 0822) FiO2 (%):  [30 %] 30 % (06/23 0822) Weight:  [62.7 kg] 62.7 kg (06/23 0500)  Filed Weights   01/05/20 1700 01/06/20 0500 01/07/20 0500  Weight: 61.7 kg 62 kg 62.7 kg    Weight change: 0.3 kg   Hemodynamic parameters for last 24 hours:    Intake/Output from previous day: 06/22 0701 - 06/23 0700 In: 682.5 [I.V.:69.5; NG/GT:505; IV Piggyback:108] Out: 20 [Chest Tube:20]  Intake/Output this shift: No intake/output data recorded.  Current Meds: Scheduled Meds: . amantadine  100 mg Per Tube BID  . amiodarone  200 mg Per Tube Daily  . aspirin  325 mg Per Tube Daily  . atorvastatin  40 mg Per Tube Daily  . B-complex with vitamin C  1 tablet Per Tube Daily  . chlorhexidine gluconate (MEDLINE KIT)  15 mL Mouth Rinse BID  . Chlorhexidine Gluconate Cloth  6 each Topical Q0600  . docusate  100 mg Per Tube BID  . feeding supplement (PRO-STAT SUGAR FREE 64)  30 mL Per Tube Daily  . heparin injection (subcutaneous)  5,000 Units Subcutaneous Q8H  . insulin aspart  0-20 Units Subcutaneous Q4H  .  insulin glargine  5 Units Subcutaneous BID  . mouth rinse  15 mL Mouth Rinse 10 times per day  . pantoprazole sodium  40 mg Per Tube QHS  . phenytoin (DILANTIN) IV  100 mg Intravenous Q8H  . polyethylene glycol  17 g Per Tube TID  . propofol  500 mg Intravenous Once  . senna-docusate  1 tablet Per Tube BID  . sodium chloride flush  10-40 mL Intracatheter Q12H   Continuous Infusions: . sodium chloride Stopped (12/30/19 2139)  . sodium chloride    . sodium chloride    . feeding supplement (VITAL AF 1.2 CAL) 60 mL/hr at 01/06/20 2000  . levETIRAcetam    . norepinephrine (LEVOPHED) Adult infusion Stopped (01/06/20 1739)   PRN Meds:.sodium chloride, sodium chloride, sodium chloride, acetaminophen, alteplase, bisacodyl, fentaNYL (SUBLIMAZE) injection, heparin, hydrALAZINE, lidocaine (PF), lidocaine-prilocaine, midazolam, ondansetron (ZOFRAN) IV, oxyCODONE, pentafluoroprop-tetrafluoroeth, senna-docusate, sodium chloride flush  Exam No purposeful movement Left chest tube secure without air leak and minimal serosanguineous drainage  Lab Results: CBC: Recent Labs    01/06/20 0437 01/07/20 0419  WBC 8.9 7.9  HGB 7.1* 7.1*  HCT 23.1* 23.3*  PLT 93* 99*   BMET:  Recent Labs    01/06/20 0824 01/07/20 0419  NA 134* 133*  K 3.6 4.2  CL 96*  95*  CO2 27 24  GLUCOSE 154* 154*  BUN 33* 57*  CREATININE 2.23* 3.24*  CALCIUM 7.4* 7.5*    CMET: Lab Results  Component Value Date   WBC 7.9 01/07/2020   HGB 7.1 (L) 01/07/2020   HCT 23.3 (L) 01/07/2020   PLT 99 (L) 01/07/2020   GLUCOSE 154 (H) 01/07/2020   CHOL 239 (H) 11/01/2019   TRIG 152 (H) 12/23/2019   HDL 69 11/01/2019   LDLDIRECT 107 (H) 10/02/2012   LDLCALC 158 (H) 11/01/2019   ALT 119 (H) 01/06/2020   AST 209 (H) 01/06/2020   NA 133 (L) 01/07/2020   K 4.2 01/07/2020   CL 95 (L) 01/07/2020   CREATININE 3.24 (H) 01/07/2020   BUN 57 (H) 01/07/2020   CO2 24 01/07/2020   TSH 1.414 03/19/2013   INR 1.4 (H) 01/06/2020    HGBA1C 9.7 (H) 11/01/2019      PT/INR:  Recent Labs    01/06/20 1218  LABPROT 16.6*  INR 1.4*   Radiology: DG Chest Port 1 View  Result Date: 01/06/2020 CLINICAL DATA:  Tracheostomy tube placement EXAM: PORTABLE CHEST 1 VIEW COMPARISON:  Earlier same day FINDINGS: 1646 hours. Tracheostomy tube visualized in situ with tip position approximately 4.8 cm above the base of the carina. A feeding tube passes into the stomach although the distal tip position is not included on the film. Left IJ central line tip overlies the mid SVC level. Left chest tube remains in place without evidence for left-sided pneumothorax. Pleuroparenchymal opacity at the lower left hemithorax is stable with left pleural effusion. The cardio pericardial silhouette is enlarged. Diffuse interstitial coarsening noted right lung. Right permanent pacemaker evident. Telemetry leads overlie the chest. IMPRESSION: 1. New tracheostomy tube with tip position approximately 4.8 cm above the base of the carina. 2. No substantial interval change in exam. Left chest tube remains in place without evidence for left-sided pneumothorax. 3. Pleuroparenchymal opacity at the lower left hemithorax with left pleural effusion. Electronically Signed   By: Misty Stanley M.D.   On: 01/06/2020 17:18     Assessment/Plan: S/P Procedure(s) (LRB): Video Assisted Thoracoscopy (Vats) left , Drainage of Hemothorax (Left) Video Bronchoscopy (N/A)  1. Pulm- trach placed yesterday. Keep remaining chest tube for now.  2. CV- BP stable, NSR in the 60s 3   check portable chest x-ray in a.m.   Plan: Keep chest tube until bone dry. Will remove if patient goes to [place] 01/07/2020 8:44 AM

## 2020-01-08 ENCOUNTER — Other Ambulatory Visit: Payer: Self-pay | Admitting: Cardiology

## 2020-01-08 ENCOUNTER — Inpatient Hospital Stay (HOSPITAL_COMMUNITY): Payer: Medicare Other

## 2020-01-08 ENCOUNTER — Encounter (HOSPITAL_COMMUNITY): Payer: Self-pay | Admitting: Pulmonary Disease

## 2020-01-08 ENCOUNTER — Ambulatory Visit: Payer: Medicare Other | Admitting: Vascular Surgery

## 2020-01-08 LAB — CBC
HCT: 23.6 % — ABNORMAL LOW (ref 36.0–46.0)
Hemoglobin: 7.2 g/dL — ABNORMAL LOW (ref 12.0–15.0)
MCH: 30.5 pg (ref 26.0–34.0)
MCHC: 30.5 g/dL (ref 30.0–36.0)
MCV: 100 fL (ref 80.0–100.0)
Platelets: 109 10*3/uL — ABNORMAL LOW (ref 150–400)
RBC: 2.36 MIL/uL — ABNORMAL LOW (ref 3.87–5.11)
RDW: 22.9 % — ABNORMAL HIGH (ref 11.5–15.5)
WBC: 8 10*3/uL (ref 4.0–10.5)
nRBC: 0 % (ref 0.0–0.2)

## 2020-01-08 LAB — GLUCOSE, CAPILLARY
Glucose-Capillary: 115 mg/dL — ABNORMAL HIGH (ref 70–99)
Glucose-Capillary: 132 mg/dL — ABNORMAL HIGH (ref 70–99)
Glucose-Capillary: 133 mg/dL — ABNORMAL HIGH (ref 70–99)
Glucose-Capillary: 161 mg/dL — ABNORMAL HIGH (ref 70–99)
Glucose-Capillary: 161 mg/dL — ABNORMAL HIGH (ref 70–99)
Glucose-Capillary: 164 mg/dL — ABNORMAL HIGH (ref 70–99)

## 2020-01-08 LAB — BASIC METABOLIC PANEL
Anion gap: 11 (ref 5–15)
BUN: 36 mg/dL — ABNORMAL HIGH (ref 8–23)
CO2: 27 mmol/L (ref 22–32)
Calcium: 7.6 mg/dL — ABNORMAL LOW (ref 8.9–10.3)
Chloride: 97 mmol/L — ABNORMAL LOW (ref 98–111)
Creatinine, Ser: 2.47 mg/dL — ABNORMAL HIGH (ref 0.44–1.00)
GFR calc Af Amer: 23 mL/min — ABNORMAL LOW (ref >60)
GFR calc non Af Amer: 20 mL/min — ABNORMAL LOW (ref >60)
Glucose, Bld: 145 mg/dL — ABNORMAL HIGH (ref 70–99)
Potassium: 4.1 mmol/L (ref 3.5–5.1)
Sodium: 135 mmol/L (ref 135–145)

## 2020-01-08 LAB — MAGNESIUM: Magnesium: 2.2 mg/dL (ref 1.7–2.4)

## 2020-01-08 LAB — PHOSPHORUS: Phosphorus: 4 mg/dL (ref 2.5–4.6)

## 2020-01-08 MED ORDER — DARBEPOETIN ALFA 100 MCG/0.5ML IJ SOSY
100.0000 ug | PREFILLED_SYRINGE | INTRAMUSCULAR | Status: DC
  Filled 2020-01-08 (×5): qty 0.5

## 2020-01-08 MED ORDER — HEPARIN SODIUM (PORCINE) 5000 UNIT/ML IJ SOLN
5000.0000 [IU] | Freq: Three times a day (TID) | INTRAMUSCULAR | Status: AC
  Administered 2020-01-08 – 2020-01-12 (×13): 5000 [IU] via SUBCUTANEOUS
  Filled 2020-01-08 (×11): qty 1

## 2020-01-08 MED ORDER — FUROSEMIDE 10 MG/ML IJ SOLN
80.0000 mg | Freq: Once | INTRAMUSCULAR | Status: AC
  Administered 2020-01-08: 80 mg via INTRAVENOUS
  Filled 2020-01-08: qty 8

## 2020-01-08 MED ORDER — HEPARIN SODIUM (PORCINE) 5000 UNIT/ML IJ SOLN: 5000.0000 [IU] | Freq: Three times a day (TID) | INTRAMUSCULAR | Status: DC

## 2020-01-08 NOTE — H&P (Addendum)
Chief Complaint: Renal failure  Referring Physician(s): Madelon Lips  Supervising Physician: Markus Daft  Patient Status: [facility] - In-pt  History of Present Illness: Krystal Little is a 64 y.o. female who suffered a Right MCA stroke. She is s/p thrombectomy by Dr. Karenann Cai which was complicated by cerebral edema.  She underwent decompressive right hemicraniectomy.  She developed Acute kidney injury secondary to hemorrhagic shock requiring pressor support.   CRRT was initiated on 12/26/2019- 6/20 via  Left IJ temp cath.  First IHD 6/21.  Per chart, she is a very poor long term candidate for hemodialysis but she is being transferred to [facility] soon and tunneled catheter will be needed prior to transfer.  Patient is currently on tube feedings via Cortrak.   She has a pacemaker implanted right chest.  Past Medical History:  Diagnosis Date  . Chronic atrial fibrillation ([place]) 1990s   s/p DCCV then attempted ablation of complex A Flutter (@ [facility] - Dr. Deno Etienne), failed antiarrhythmics --> INR folllwed @ [facility] ->> now status post pacemaker placement with underlying A. fib.  . Diabetes mellitus type 2, uncontrolled, without complications    On Oral Medications ([facility])  . Essential hypertension   . History of cardiac catheterization 2001   R&LHC - normal Coronaries, no evidence of Restictive Cardiomyopathy or Constrictive Pericarditis (also @ [facility])  . Hx of sick sinus syndrome 07/1999   Wtih symptomatic bradycardia - syncope (Tachy-Brady)  . 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 CARDIAC CATHETERIZATION  08/03/1999   [place] - normal Coronaries; no sign of constriction or restrictive cardiomypathy  . CRANIECTOMY FOR DEPRESSED SKULL FRACTURE Right 11/02/2019   Procedure: DECOMPRESSIVE HEMI -CRANIECTOMY, IMPLANTATION OF SKULL FLAP TO RIGHT ABDOMEN;  Surgeon: Consuella Lose, MD;  Location: [place];  Service: Neurosurgery;  Laterality: Right;  . IR CT HEAD LTD  10/31/2019  . IR PERCUTANEOUS ART THROMBECTOMY/INFUSION INTRACRANIAL INC DIAG ANGIO  10/31/2019      . IR PERCUTANEOUS ART THROMBECTOMY/INFUSION INTRACRANIAL INC DIAG ANGIO  10/31/2019  . MITRAL VALVE REPAIR  07/1999   Both Ant& Post leaflet repair - quadrangular resection&caudal transposition, #32 Sequin Annuloplasty ring  . PACEMAKER GENERATOR CHANGE  11/26/2008   Medtronic Adapta L(? if it has been checked since 02/2013)  . PACEMAKER INSERTION  07/1999   for SSS in setting of Chronic Afib; Medtronic Kappa [id number], [id number].  Marland Kitchen RADIOLOGY WITH ANESTHESIA N/A 10/31/2019   Procedure: IR WITH ANESTHESIA;  Surgeon: Radiologist, Medication, MD;  Location: [place];  Service: Radiology;  Laterality: N/A;  . TRANSTHORACIC ECHOCARDIOGRAM  05/2018   Normal LV size and thickness.  EF estimated 50%.  Inferobasal HK and flattened septum c/w with elevated PA pressures. Mild functional mitral stenosis at rest (postoperative).  Moderate left atrial dilation and mild right atrial dilation.  Mean PA pressures estimated 52 mmHg.  Marland Kitchen VIDEO ASSISTED THORACOSCOPY (VATS)/THOROCOTOMY Left 01/03/2020   Procedure: Video Assisted Thoracoscopy (Vats) left , Drainage of Hemothorax;  Surgeon: Ivin Poot, MD;  Location: [place];  Service: Thoracic;  Laterality: Left;  Marland Kitchen VIDEO BRONCHOSCOPY N/A 01/10/2020   Procedure: Video Bronchoscopy;  Surgeon: Ivin Poot, MD;  Location: [place];  Service: Thoracic;  Laterality: N/A;    Allergies: Patient has no known allergies.  Medications: Prior to Admission  medications   Medication Sig Start Date End Date Taking? Authorizing Provider  acetaminophen (TYLENOL) 500 MG tablet Take 500-1,000 mg by mouth every 6 (six) hours as needed for headache (pain).    [provider]  amantadine (SYMMETREL) 50 MG/5ML solution Place 10 mLs (100 mg total) into feeding tube 2 (two) times daily. 11/20/19   Donzetta Starch, NP  Amino Acids-Protein Hydrolys (FEEDING SUPPLEMENT, PRO-STAT SUGAR FREE 64,) LIQD Take 30 mLs by mouth 2 (two) times daily. 11/20/19   Donzetta Starch, NP  aspirin 325 MG tablet Place 1 tablet (325 mg total) into feeding tube daily. 11/21/19   Donzetta Starch, NP  atorvastatin (LIPITOR) 40 MG tablet Place 1 tablet (40 mg total) into feeding tube daily. 11/21/19   Donzetta Starch, NP  Chlorhexidine Gluconate Cloth 2 % PADS Apply 6 each topically daily at 6 (six) AM. 11/21/19   Donzetta Starch, NP  chlorhexidine gluconate, MEDLINE KIT, (PERIDEX) 0.12 % solution 15 mLs by Mouth Rinse route 2 (two) times daily. 11/20/19   Donzetta Starch, NP  diltiazem (CARDIZEM) 10 mg/ml oral suspension Place 6 mLs (60 mg total) into feeding tube every 6 (six) hours. 11/20/19   Donzetta Starch, NP  enoxaparin (LOVENOX) 40 MG/0.4ML injection Inject 0.4 mLs (40 mg total) into the skin daily. 11/20/19   Donzetta Starch, NP  insulin aspart (NOVOLOG) 100 UNIT/ML injection Inject 0-9 Units into the skin 3 (three) times daily with meals. 11/20/19   Donzetta Starch, NP  insulin aspart (NOVOLOG) 100 UNIT/ML injection Inject 8 Units into the skin 2 (two) times daily. 11/20/19   Donzetta Starch, NP  insulin glargine (LANTUS) 100 UNIT/ML injection Inject 0.2 mLs (20 Units total) into the skin 2 (two) times daily. 11/20/19   Donzetta Starch, NP  Maltodextrin-Xanthan Gum (RESOURCE THICKENUP CLEAR) POWD Take 120 g by mouth as needed (nectar thick liquids). 11/20/19   Donzetta Starch, NP  metoprolol tartrate (LOPRESSOR) 25 MG tablet Place 1 tablet (25 mg total) into feeding tube 2 (two) times daily. 11/20/19   Donzetta Starch, NP  Nutritional Supplements (FEEDING SUPPLEMENT, GLUCERNA 1.5 CAL,) LIQD Place 500 mLs into feeding tube daily. 11/20/19   Donzetta Starch, NP  polyethylene glycol (MIRALAX / GLYCOLAX) 17 g packet Place 17 g into feeding tube 2 (two) times daily. 11/20/19   Donzetta Starch, NP  senna-docusate (SENOKOT-S) 8.6-50 MG tablet Place 1 tablet into feeding tube at bedtime as needed for mild constipation. 11/20/19   Donzetta Starch, NP  senna-docusate (SENOKOT-S) 8.6-50 MG tablet Place 1 tablet into feeding tube 2 (two) times daily. 11/20/19   Donzetta Starch, NP     Family History  Problem Relation Age of Onset  . Hypertension Mother   . Diabetes Sister   . Cancer Brother        Rectal  . Lung cancer Brother     Social History   Socioeconomic History  . Marital status: Married    Spouse name: Riki Rusk   . Number of children: 2  . Years of education: 16 years.  . Highest education level: Not on file  Occupational History  . 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 Smoking status: Never Smoker  . Smokeless tobacco: Never Used  Vaping Use  . Vaping Use: Never used  Substance and Sexual Activity  . Alcohol use: Yes    Alcohol/week: 0.0 standard drinks    Comment: occasional, once every month or two- glass of wine   . Drug use: No  . Sexual activity: Not Currently    Partners: Male    Comment: 1st intercourse 17 yo-5 partners  Other Topics Concern  . Not on file  Social History Narrative   Lives with husband, great-niece (24)  and great-nephew (14).    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   1 son died in an automobile accident @ age 22 (2010).    She raised several nieces & nephews - now lives with her 25 y/o daughter (who recently moved back in)   Routinely walks 2-3 days/week.   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   Social  Determinants of Health   Financial Resource Strain:   . Difficulty of Paying Living Expenses:   Food Insecurity:   . Worried About Running Out of Food in the Last Year:   . Ran Out of Food in the Last Year:   Transportation Needs:   . Lack of Transportation (Medical):   . Lack of Transportation (Non-Medical):   Physical Activity:   . Days of Exercise per Week:   . Minutes of Exercise per Session:   Stress:   . Feeling of Stress :   Social Connections:   . Frequency of Communication with Friends and Family:   . Frequency of Social Gatherings with Friends and Family:   . Attends Religious Services:   . Active Member of Clubs or Organizations:   . Attends Club or Organization Meetings:   . Marital Status:      Review of Systems  Unable to perform ROS: Patient nonverbal    Vital Signs: BP (!) 167/79   Pulse 65   Temp 99.2 F (37.3 C)   Resp (!) 35   Ht 5' 4" (1.626 m)   Wt 62 kg   SpO2 100%   BMI 23.46 kg/m   Physical Exam Vitals reviewed.  Constitutional:      Comments: Opens eyes to voice  Neck:     Comments: Temporary HD catheter left IJ Cardiovascular:     Rate and Rhythm: Normal rate and regular rhythm.  Pulmonary:     Comments: Trach/vent Chest:       Comments: Pacemaker implanted on Right Abdominal:     Palpations: Abdomen is soft.  Skin:    General: Skin is warm and dry.     Imaging: CT ABDOMEN PELVIS WO CONTRAST  Result Date: 01/01/2020 CLINICAL DATA:  Hypotensive. Dropping hemoglobin. Concern for retroperitoneal hematoma. Follow-up for hemothorax. EXAM: CT CHEST, ABDOMEN AND PELVIS WITHOUT CONTRAST TECHNIQUE: Multidetector CT imaging of the chest, abdomen and pelvis was performed following the standard protocol without IV contrast. COMPARISON:  CT, 03/03/2009. FINDINGS: CT CHEST FINDINGS Cardiovascular: Heart is normal in size. No pericardial effusion. Two vessel coronary artery calcifications. Great vessels normal in caliber. No aortic  atherosclerotic calcifications. Mediastinum/Nodes: Endotracheal tube is well positioned, tip 2 cm above the carina. Nasogastric tube passes into the distal stomach. No neck base, mediastinal or hilar masses or convincing adenopathy. Trachea is unremarkable. Left PICC catheter tip projects   in the superior vena cava just above the caval atrial junction. Right anterior chest wall AICD is well positioned. Lungs/Pleura: Large complex left pleural effusion. There is relative increased attenuation of the pleural fluid dependently consistent with a hematocrit effect, indicating a hemothorax. There is complete compressive atelectasis of the left lung. Pigtail left-sided chest tube lies along the anterolateral mid left hemithorax pleural space. Small right pleural effusion. There are patchy areas of ground-glass and more confluent airspace opacities in the right lung, all lobes but most notably in the right upper lobe, suspicious for multifocal pneumonia. No pneumothorax. Musculoskeletal: No fracture or acute finding. No osteoblastic or osteolytic lesions. Previous median sternotomy. CT ABDOMEN PELVIS FINDINGS Hepatobiliary: Liver normal in overall size. Nodular liver contour. Relative enlargement of the caudate lobe. Small low-density lesion, inferior aspect of segment 6 consistent with a cyst. These findings are stable. Relative increased attenuation of the gallbladder contents consistent with viscous bile versus sludge. No evidence of acute cholecystitis. No bile duct dilation. Pancreas: Unremarkable. No pancreatic ductal dilatation or surrounding inflammatory changes. Spleen: Dense material lies along the splenic hilum, stable from the prior CT, consistent with previous aneurysm coiling. Spleen normal in size. No splenic mass. Adrenals/Urinary Tract: No adrenal masses. Kidneys normal in size, orientation and position. No renal mass or stone. No hydronephrosis. Normal ureters. Bladder decompressed with a Foley catheter.  Stomach/Bowel: Normal stomach. Small bowel and colon are normal in caliber. No wall thickening. No inflammation. Normal appendix. Vascular/Lymphatic: Minimal aortic atherosclerotic calcifications. No aneurysm. No enlarged lymph nodes. Reproductive: Uterus and bilateral adnexa are unremarkable. Other: No ascites.  No retroperitoneal hemorrhage. There is radiopaque material within the deep subcutaneous fat of the right anterior abdomen. Small fat containing umbilical hernia. Subcutaneous edema noted along the lateral pelvis. Musculoskeletal: No acute or significant osseous findings. IMPRESSION: 1. Large left hemothorax. This has mass effect displacing the left hemidiaphragm inferiorly, and causing complete atelectasis of the left lung. The pigtail chest tube lies along the anterolateral left mid hemithorax, not within the large hemothorax. 2. Right lung patchy ground-glass and more confluent opacities, most evident in the right upper lobe, suspicious for multifocal pneumonia. Small right pleural effusion. 3. No acute findings within the abdomen or pelvis. No evidence of a retroperitoneal hematoma. 4. Liver appearance is consistent with cirrhosis. Electronically Signed   By: David  Ormond M.D.   On: 01/08/2020 11:49   DG Chest 1 View  Result Date: 12/01/2019 CLINICAL DATA:  Difficulty airway for intubation. EXAM: CHEST  1 VIEW COMPARISON:  Dec 14, 2019 FINDINGS: The ETT terminates 3 cm above the carina in good position. A left-sided chest tube is identified. Near complete opacification of the left chest is again identified with a probable large associated effusion and underlying opacity. No pneumothorax on the right. The right lung is clear. The cardiomediastinal silhouette is unchanged. IMPRESSION: 1. Support apparatus as above. 2. Continued near complete opacification of the left hemithorax. Electronically Signed   By: David  Williams III M.D   On: 11/24/2019 13:16   DG Chest 2 View  Result Date:  12/12/2019 CLINICAL DATA:  Tachypnea EXAM: CHEST - 2 VIEW COMPARISON:  11/15/2019 FINDINGS: Post sternotomy changes. Right-sided pacing device as before. Right lung is grossly clear. Large left-sided pleural effusion, increased compared to prior with small amount of aerated lung at the apex. Enlarged cardiomediastinal silhouette. No pneumothorax. IMPRESSION: 1. Large left-sided pleural effusion, increased compared to prior with minimal aerated lung at the left apex. 2. Cardiomegaly Electronically Signed     By: Kim  Fujinaga M.D.   On: 12/12/2019 20:54   DG Abd 1 View  Result Date: 12/31/2019 CLINICAL DATA:  Abdominal distension. EXAM: ABDOMEN - 1 VIEW COMPARISON:  Radiographs 12/03/2019 and 11/09/2019.  CT 12/22/2019. FINDINGS: 1808 hours. Two views are submitted. Feeding tube projects to the level of the mid stomach. Cardiac pacemaker leads are noted. The bowel gas pattern is nonobstructive. There is gas throughout the small and large bowel. No supine evidence of free intraperitoneal air. Calcification within the left upper quadrant of the abdomen corresponds with a chronic splenic calcification on CT. Small bilateral pleural effusions are noted. IMPRESSION: Nonobstructive bowel gas pattern. Feeding tube tip in the mid stomach. Electronically Signed   By: William  Veazey M.D.   On: 12/31/2019 18:45   DG Abd 1 View  Result Date: 11/28/2019 CLINICAL DATA:  OG tube placement EXAM: ABDOMEN - 1 VIEW COMPARISON:  November 09, 2019 FINDINGS: The distal tip of the OG tube is in the right mid abdomen, likely in the distal stomach. IMPRESSION: The distal tip of the OG tube is likely in the distal stomach. Electronically Signed   By: David  Williams III M.D   On: 12/05/2019 15:13   CT HEAD WO CONTRAST  Result Date: 12/25/2019 CLINICAL DATA:  64-year-old female status post right MCA infarct, hemi craniectomy. EXAM: CT HEAD WITHOUT CONTRAST TECHNIQUE: Contiguous axial images were obtained from the base of the skull  through the vertex without intravenous contrast. COMPARISON:  Head CT 12/24/2019 and earlier. FINDINGS: Brain: Small hemorrhages in the bilateral cerebellum appear mildly regressed, with slightly less edema around larger left side hemorrhage. No posterior fossa mass effect. Right MCA mixed density infarct with hemorrhage in the temporal and inferior parietal lobe appears stable. Small volume intraventricular hemorrhage is stable. No ventriculomegaly, but there is mildly increased ex vacuo enlargement of the right frontal horn. Regressed small volume contralateral left side subarachnoid hemorrhage. Basilar cisterns are normal. No significant midline shift. Stable gray-white matter differentiation elsewhere. Vascular: Calcified atherosclerosis at the skull base. Skull: Stable right hemi craniectomy. Sinuses/Orbits: Partially visible right nasoenteric tube. Bubbly opacity in the pharynx. Visualized paranasal sinuses and mastoids are stable and well pneumatized. Other: Stable orbit and scalp soft tissues. IMPRESSION: 1. Stable hemorrhagic Right MCA infarct since 12/17/2019. Right hemi-craniectomy with no midline shift or significant intracranial mass effect. 2. Mildly regressed small cerebellar hemorrhages. No posterior fossa mass effect. 3. Stable small volume intraventricular hemorrhage, no ventriculomegaly. Trace subarachnoid hemorrhage largely resolved. 4. No new intracranial abnormality. Electronically Signed   By: H  Hall M.D.   On: 12/25/2019 15:19   CT HEAD WO CONTRAST  Result Date: 12/22/2019 CLINICAL DATA:  Initial evaluation for intracranial hemorrhage. Evaluate for interval ICH expansion. EXAM: CT HEAD WITHOUT CONTRAST TECHNIQUE: Contiguous axial images were obtained from the base of the skull through the vertex without intravenous contrast. COMPARISON:  Comparison made with prior CT from earlier the same day. FINDINGS: Brain: Sequelae of prior decompressive right hemi craniectomy again seen. Evolving  subacute right MCA territory infarct involving the underlying right cerebral hemispheres unchanged. Superimposed intraparenchymal hematoma at the posterior margin of the area of infarction is stable in size from previous without evidence for interval expansion. Surrounding low-density vasogenic edema is stable. Associated intraventricular hemorrhage within the right greater than left lateral ventricles also not significantly changed. Additional previously identified small left greater than right cerebellar hemorrhage yours are also not significantly changed. Mild surrounding edema without significant regional mass effect seen about the left   cerebellar hemorrhage which measures approximately 1.5 cm. Additional small linear hemorrhage involving the peripheral left temporal lobe is also unchanged. Trace subarachnoid blood involving the left cerebral hemisphere again noted, stable. No other new sites of hemorrhage. No new large vessel territory infarct. Similar swelling of the brain through the craniectomy defect. No midline shift or hydrocephalus. No extra-axial fluid collection. Vascular: No hyperdense vessel. Scattered vascular calcifications noted within the carotid siphons. Skull: Prior right hemi craniectomy. Sinuses/Orbits: Globes and orbital soft tissues within normal limits. Scattered mucosal thickening again noted within the paranasal sinuses. Nasogastric tube in place. Other: None. IMPRESSION: 1. Stable head CT as compared to previous exam performed at 7:19 p.m. earlier the same day. No evidence for significant interval expansion or worsening of multifocal intracerebral hemorrhages as above. 2. No other new acute intracranial abnormality. Electronically Signed   By: Benjamin  McClintock M.D.   On: 12/22/2019 00:14   CT HEAD WO CONTRAST  Result Date: 01/07/2020 CLINICAL DATA:  Initial evaluation for cerebral edema about the craniectomy site. EXAM: CT HEAD WITHOUT CONTRAST TECHNIQUE: Contiguous axial images  were obtained from the base of the skull through the vertex without intravenous contrast. COMPARISON:  Comparison made with most recent available CT from 11/21/2019. FINDINGS: Brain: Postoperative changes from prior decompressive right hemi craniectomy again seen. There has been continued interval evolution of previously identified right MCA territory infarct, overall similar in size and distribution. However, there has been evidence for interval hemorrhagic transformation and bleeding, with large parenchymal hematoma now seen at the posterior aspect of the infarcted territory. Hematoma measures 5.5 x 3.9 x 4.9 cm (estimated volume 53 cc). Associated intraventricular extension with blood seen within the right greater than left lateral ventricles. Associated edema seen adjacent to the hematoma and throughout the evolving infarct, with mildly increased protrusion of the brain through the craniectomy defect. No significant midline shift. Additionally, now seen are 2 small intraparenchymal hemorrhages at the inferior cerebellum, left slightly larger than right (series 2, image 7). For reference purposes, the largest of these on the left measures 12 mm. Additional trace subarachnoid blood noted within the left cerebral hemisphere, most pronounced anteriorly (series 2, image 16). Additional small linear parenchymal hemorrhage seen involving the posterior left temporal lobe, measuring 1.5 cm in size (series 5, image 51). No other acute intracranial hemorrhage. No new acute large vessel territory infarct. No hydrocephalus. No extra-axial fluid collection. Vascular: No hyperdense vessel. Skull: Prior right hemi craniectomy. Sinuses/Orbits: Globes and orbital soft tissues within normal limits. Scattered mucosal thickening noted within the ethmoidal air cells and maxillary sinuses. Nasogastric tube partially visualized. Mastoid air cells are clear. Other: None. IMPRESSION: 1. Continued interval evolution of right MCA territory  infarct, overall similar in size and distribution from previous. However, there has been evidence for interval hemorrhagic transformation and bleeding, with large parenchymal hematoma now seen at the posterior aspect of the infarcted territory, measuring 5.5 x 3.9 x 4.9 cm (estimated volume 53 cc). Associated intraventricular extension with blood in the right greater than left lateral ventricles. No hydrocephalus or significant midline shift. 2. Interval development of 2 additional small intraparenchymal hemorrhages at the inferior cerebellum, left slightly larger than right. Additional small linear parenchymal hemorrhage involving the posterior left temporal lobe. 3. Additional trace subarachnoid blood within the left cerebral hemisphere. Critical Value/emergent results were called by telephone at the time of interpretation on 01/02/2020 at 8:35 pm to provider Dr. Yap, Who verbally acknowledged these results. Electronically Signed   By: Benjamin  McClintock M.D.     On: 12/20/2019 20:38   CT CHEST WO CONTRAST  Result Date: 01/06/2020 CLINICAL DATA:  Hypotensive. Dropping hemoglobin. Concern for retroperitoneal hematoma. Follow-up for hemothorax. EXAM: CT CHEST, ABDOMEN AND PELVIS WITHOUT CONTRAST TECHNIQUE: Multidetector CT imaging of the chest, abdomen and pelvis was performed following the standard protocol without IV contrast. COMPARISON:  CT, 03/03/2009. FINDINGS: CT CHEST FINDINGS Cardiovascular: Heart is normal in size. No pericardial effusion. Two vessel coronary artery calcifications. Great vessels normal in caliber. No aortic atherosclerotic calcifications. Mediastinum/Nodes: Endotracheal tube is well positioned, tip 2 cm above the carina. Nasogastric tube passes into the distal stomach. No neck base, mediastinal or hilar masses or convincing adenopathy. Trachea is unremarkable. Left PICC catheter tip projects in the superior vena cava just above the caval atrial junction. Right anterior chest wall AICD is  well positioned. Lungs/Pleura: Large complex left pleural effusion. There is relative increased attenuation of the pleural fluid dependently consistent with a hematocrit effect, indicating a hemothorax. There is complete compressive atelectasis of the left lung. Pigtail left-sided chest tube lies along the anterolateral mid left hemithorax pleural space. Small right pleural effusion. There are patchy areas of ground-glass and more confluent airspace opacities in the right lung, all lobes but most notably in the right upper lobe, suspicious for multifocal pneumonia. No pneumothorax. Musculoskeletal: No fracture or acute finding. No osteoblastic or osteolytic lesions. Previous median sternotomy. CT ABDOMEN PELVIS FINDINGS Hepatobiliary: Liver normal in overall size. Nodular liver contour. Relative enlargement of the caudate lobe. Small low-density lesion, inferior aspect of segment 6 consistent with a cyst. These findings are stable. Relative increased attenuation of the gallbladder contents consistent with viscous bile versus sludge. No evidence of acute cholecystitis. No bile duct dilation. Pancreas: Unremarkable. No pancreatic ductal dilatation or surrounding inflammatory changes. Spleen: Dense material lies along the splenic hilum, stable from the prior CT, consistent with previous aneurysm coiling. Spleen normal in size. No splenic mass. Adrenals/Urinary Tract: No adrenal masses. Kidneys normal in size, orientation and position. No renal mass or stone. No hydronephrosis. Normal ureters. Bladder decompressed with a Foley catheter. Stomach/Bowel: Normal stomach. Small bowel and colon are normal in caliber. No wall thickening. No inflammation. Normal appendix. Vascular/Lymphatic: Minimal aortic atherosclerotic calcifications. No aneurysm. No enlarged lymph nodes. Reproductive: Uterus and bilateral adnexa are unremarkable. Other: No ascites.  No retroperitoneal hemorrhage. There is radiopaque material within the deep  subcutaneous fat of the right anterior abdomen. Small fat containing umbilical hernia. Subcutaneous edema noted along the lateral pelvis. Musculoskeletal: No acute or significant osseous findings. IMPRESSION: 1. Large left hemothorax. This has mass effect displacing the left hemidiaphragm inferiorly, and causing complete atelectasis of the left lung. The pigtail chest tube lies along the anterolateral left mid hemithorax, not within the large hemothorax. 2. Right lung patchy ground-glass and more confluent opacities, most evident in the right upper lobe, suspicious for multifocal pneumonia. Small right pleural effusion. 3. No acute findings within the abdomen or pelvis. No evidence of a retroperitoneal hematoma. 4. Liver appearance is consistent with cirrhosis. Electronically Signed   By: David  Ormond M.D.   On: 01/12/2020 11:49   CT CHEST WO CONTRAST  Result Date: 12/12/2019 CLINICAL DATA:  Hemothorax unexplained.  On anticoagulation. EXAM: CT CHEST WITHOUT CONTRAST TECHNIQUE: Multidetector CT imaging of the chest was performed following the standard protocol without IV contrast. COMPARISON:  Radiograph yesterday, additional priors. Remote chest CT 12/20/2009 FINDINGS: Cardiovascular: Pacemaker with leads in the right atrium and ventricle. Thoracic aorta is normal in caliber. There is multi chamber   cardiomegaly, with prominent left atrial dilatation. Coronary artery calcifications. Mitral annulus calcifications. No definite pericardial effusion, fluid abutting the superior heart border felt to be loculated pleural effusion. Mediastinum/Nodes: Limited assessment for adenopathy. Lack of IV contrast as well as streak artifact from right-sided pacemaker. There is a 12 mm lower paratracheal node. The esophagus is displaced posteriorly by left atrial enlargement. No visualized thyroid nodule. Lungs/Pleura: Motion artifact limits assessment. Left mainstem bronchus is completely opacified with lobulated density just  below the carina. Left lower lobe bronchus is completely opacified. Minimal air within the segmental branch of the left lower lobe, however no significant aerated lung. Left upper lobe bronchus is opacified. Minimal air within the segmental bronchus of the anterior upper lobe. Small portion of left upper lobe is aerated. Ground-glass and dependent opacities within the aerated left upper lobe. Moderate size left pleural effusion which is partially loculated. Density measurements of the pleural fluid are limited given streak artifact from pacemaker, however does not appear increased density such is hemothorax. No pneumothorax. Ground-glass opacity in the periphery of the right upper lobe, series 4, image 23. Right lung otherwise clear. There is no right pleural effusion. Upper Abdomen: Coarse calcification in the spleen, also seen on prior exam. Cirrhotic hepatic morphology. Subcapsular 13 mm low-density in the right lobe is likely cyst, but incompletely characterized. There is left adrenal thickening without dominant nodule. Musculoskeletal: Median sternotomy. No evidence of focal bone lesion or acute osseous abnormality. IMPRESSION: 1. Moderate size partially loculated left pleural effusion. 2. Significant filling of the left bronchial tree, completely filling the left mainstem bronchus, left lower lobe bronchus, and incomplete filling the left upper lobe bronchus. This may represent aspiration. Recommend bronchoscopy for further evaluation to exclude possibility of underlying obstructive mass. 3. Complete collapse of the left lower lobe. Left upper lobe demonstrates ground-glass opacity which may be compressive atelectasis or pneumonia. 4. Small focal ground-glass opacity in the periphery of the right upper lobe is nonspecific. 5. Multi chamber cardiomegaly with prominent left atrial enlargement which causes mass effect on the adjacent esophagus. 6. Cirrhosis. Aortic Atherosclerosis (ICD10-I70.0). Electronically  Signed   By: Keith Rake M.D.   On: 11/22/2019 03:25   US RENAL  Result Date: 12/19/2019 CLINICAL DATA:  Acute renal injury EXAM: RENAL / URINARY TRACT ULTRASOUND COMPLETE COMPARISON:  None. FINDINGS: Right Kidney: Renal measurements: 11.2 x 6.1 x 4.2 cm. = volume: 147 mL . Echogenicity within normal limits. No mass or hydronephrosis visualized. Left Kidney: Renal measurements: 10.0 x 4.8 x 2.9 cm. = volume: 68 mL. Echogenicity within normal limits. No mass or hydronephrosis visualized. Bladder: Appears normal for degree of bladder distention. Other: None. IMPRESSION: No acute abnormality noted. Electronically Signed   By: Inez Catalina M.D.   On: 12/19/2019 03:03   DG Chest Port 1 View  Result Date: 01/08/2020 CLINICAL DATA:  Follow-up chest tube EXAM: PORTABLE CHEST 1 VIEW COMPARISON:  01/06/2020 FINDINGS: Cardiac shadow is stable. Postoperative changes are again identified. Feeding catheter, tracheostomy tube, left-sided PICC line and left jugular central line are again seen and stable. Left-sided chest tube is noted with the tip overlying the left apex. No pneumothorax is seen. Persistent pleural thickening is noted on the left. Small left effusion is again identified. Right lung remains clear. Pacing device is noted. IMPRESSION: Overall stable appearance of the chest. No recurrent pneumothorax is noted. Electronically Signed   By: Inez Catalina M.D.   On: 01/08/2020 08:12   DG Chest [facility] 1 View  Result  Date: 01/06/2020 CLINICAL DATA:  Tracheostomy tube placement EXAM: PORTABLE CHEST 1 VIEW COMPARISON:  Earlier same day FINDINGS: 1646 hours. Tracheostomy tube visualized in situ with tip position approximately 4.8 cm above the base of the carina. A feeding tube passes into the stomach although the distal tip position is not included on the film. Left IJ central line tip overlies the mid SVC level. Left chest tube remains in place without evidence for left-sided pneumothorax. Pleuroparenchymal  opacity at the lower left hemithorax is stable with left pleural effusion. The cardio pericardial silhouette is enlarged. Diffuse interstitial coarsening noted right lung. Right permanent pacemaker evident. Telemetry leads overlie the chest. IMPRESSION: 1. New tracheostomy tube with tip position approximately 4.8 cm above the base of the carina. 2. No substantial interval change in exam. Left chest tube remains in place without evidence for left-sided pneumothorax. 3. Pleuroparenchymal opacity at the lower left hemithorax with left pleural effusion. Electronically Signed   By: Misty Stanley M.D.   On: 01/06/2020 17:18   DG Chest Port 1 View  Result Date: 01/06/2020 CLINICAL DATA:  Endotracheal tube present EXAM: PORTABLE CHEST 1 VIEW COMPARISON:  01/03/2020 FINDINGS: Endotracheal tube, left IJ and PICC lines, partially imaged enteric tube, and left apical chest tube again noted. No pneumothorax. Similar appearance of partially loculated left pleural effusion. Probable underlying patchy left lung atelectasis. Improving aeration at the right lung base. Stable cardiomediastinal contours.  Right chest wall pacemaker. IMPRESSION: 1. Improving aeration at the right lung base. Similar appearance of partially loculated left pleural effusion and probable underlying patchy atelectasis. 2. Stable lines and tubes. Electronically Signed   By: Macy Mis M.D.   On: 01/06/2020 09:07   DG Chest Port 1 View  Result Date: 01/03/2020 CLINICAL DATA:  Pleural effusion. EXAM: PORTABLE CHEST 1 VIEW COMPARISON:  12/31/2019 FINDINGS: Sternal wires and right-sided pacemaker unchanged. Enteric tube courses into the region of the stomach and off the film as tip is not visualized. Left IJ central venous catheter unchanged. Endotracheal tube has tip 3.4 cm above the carina. Left basilar chest tube unchanged. Left-sided PICC line unchanged. Lungs are somewhat hypoinflated demonstrate interval improvement in hazy opacification over the  mid to lower lungs likely improving effusion/atelectasis. Cardiomediastinal silhouette and remainder of the exam is unchanged. IMPRESSION: 1. Interval improving hazy opacification over the mid to lower lungs likely improving effusion/atelectasis. 2.  Tubes and lines as described. Electronically Signed   By: Marin Olp M.D.   On: 01/03/2020 10:09   DG CHEST PORT 1 VIEW  Result Date: 12/31/2019 CLINICAL DATA:  Intubated.  Chest tube. EXAM: PORTABLE CHEST 1 VIEW COMPARISON:  12/30/2019 and CT chest 12/04/2019. FINDINGS: Endotracheal tube terminates approximately 4.5 cm above the carina. Left PICC and left IJ central line tips are in the SVC. Right-sided pacemaker lead tips are in the right atrium and right ventricle. Two left chest tubes are in place with tips at the apex and base of the left hemithorax. Heart is enlarged, stable. Basilar predominant mixed interstitial and airspace opacification with moderate bilateral pleural effusions, partially loculated on the left. Aeration in the right lower lobe has worsened in the interval. Left thoracotomies with surgical skin staples over the lateral left chest. IMPRESSION: 1. Partially loculated moderate left pleural effusion with 2 left chest tubes in place, status post left thoracotomy for hemothorax evacuation 01/04/2020. No pneumothorax. 2. Mixed interstitial and airspace opacification bilaterally with interval worsening in aeration in the right lower lobe. Findings may be due to a  combination of atelectasis and pneumonia. Aspiration is not excluded. 3. Moderate right pleural effusion, increased. Electronically Signed   By: Lorin Picket M.D.   On: 12/31/2019 09:29   DG Chest Port 1 View  Result Date: 12/30/2019 CLINICAL DATA:  Hypoxia EXAM: PORTABLE CHEST 1 VIEW COMPARISON:  December 29, 2019 FINDINGS: Endotracheal tube tip is 4.3 cm above the carina. Feeding tube tip is below the diaphragm. There are chest tubes on the left. Left jugular catheter tip is in the  superior vena cava. Left subclavian catheter tip is in the superior vena cava. No pneumothorax. There is a pleural effusion on each side. There is consolidation in portions of the left mid and lower lung zones. No new opacity evident. There is cardiomegaly with pulmonary vascularity within normal limits. No adenopathy. No bone lesions. IMPRESSION: Tube and catheter positions as described. No pneumothorax. Areas of airspace opacity in the left mid and lower lung zone regions, stable. Pleural effusions bilaterally. Stable cardiac prominence. Electronically Signed   By: Lowella Grip III M.D.   On: 12/30/2019 07:55   DG Chest Port 1 View  Result Date: 12/29/2019 CLINICAL DATA:  Hemothorax. EXAM: PORTABLE CHEST 1 VIEW COMPARISON:  12/27/2019 FINDINGS: An endotracheal tube terminates approximately 3 cm above the carina. A left jugular catheter and left PICC both terminate over the SVC. An enteric tube courses into the abdomen with tip not imaged. A dual lead pacemaker remains in place. Three left-sided chest tubes are unchanged in position. The cardiac silhouette remains mildly enlarged. A small, likely partially loculated left pleural effusion is unchanged. There is also likely a persistent small veiling pleural effusion in the right lung base. Asymmetric airspace opacity in the left lower lung is unchanged. Mild interstitial prominence is unchanged. No pneumothorax is identified. IMPRESSION: 1. Unchanged pleural effusions and asymmetric left lower lung consolidation or atelectasis with suspected mild pulmonary edema. 2. No pneumothorax. Electronically Signed   By: Logan Bores M.D.   On: 12/29/2019 08:14   DG CHEST PORT 1 VIEW  Result Date: 12/27/2019 CLINICAL DATA:  Hypoxia EXAM: PORTABLE CHEST 1 VIEW COMPARISON:  December 26, 2019 FINDINGS: Endotracheal tube tip is 2.6 cm above the carina. Feeding tube tip is below the diaphragm. Central catheter tips are in the superior vena cava. Pacemaker leads are  attached to the right atrium and right ventricle. Chest tubes are present on the left. No pneumothorax. There is a left pleural effusion with consolidation in the left lower lobe. There is also a right pleural effusion with mild right base atelectasis. Heart is upper normal in size with pulmonary vascularity normal. No adenopathy. No bone lesions. IMPRESSION: And catheter positions as described without pneumothorax. Bilateral pleural effusions with left lower lobe airspace opacity concerning for pneumonia, likely with associated atelectasis. Stable cardiac silhouette. Electronically Signed   By: Lowella Grip III M.D.   On: 12/27/2019 09:51   DG CHEST PORT 1 VIEW  Result Date: 12/26/2019 CLINICAL DATA:  64 year old female status post central line placement. EXAM: PORTABLE CHEST 1 VIEW COMPARISON:  Chest x-ray 12/26/2019. FINDINGS: An endotracheal tube is in place with tip 3.1 cm above the carina. A feeding tube is seen extending into the abdomen, however, the tip of the feeding tube extends below the lower margin of the image. There is a left-sided internal jugular central venous catheter with tip terminating in the superior cavoatrial junction. Right IJ Cordis with tip terminating in the mid superior vena cava. 3 left-sided chest tubes appear stable in  position with tips projecting over the upper, mid and lower left hemithorax. Numerous skin staples overlying the left hemithorax. Status post median sternotomy. Right-sided pacemaker device in place with lead tips projecting over the expected location of the right atrium and right ventricle. Lung volumes are low. Opacities throughout the left mid to lower lung, which may reflect areas of atelectasis and/or consolidation. Small amount of left pleural fluid, some of which is likely partially loculated in the lateral aspect of the left mid hemithorax. No appreciable pneumothorax. Cephalization of the pulmonary vasculature with indistinct interstitial markings.  Mild cardiomegaly. Upper mediastinal contours are within normal limits. IMPRESSION: 1. Postoperative changes and support apparatus, as above. 2. The appearance the chest suggests mild pulmonary edema. 3. Atelectasis and/or consolidation in the left mid to lower lung with small left pleural effusion. Electronically Signed   By: Daniel  Entrikin M.D.   On: 12/26/2019 12:10   DG Chest Port 1 View  Result Date: 12/26/2019 CLINICAL DATA:  Respiratory failure EXAM: PORTABLE CHEST 1 VIEW COMPARISON:  1 day prior FINDINGS: Endotracheal tube terminates 2.4 cm above carina. Feeding tube extends beyond the inferior aspect of the film. Right internal jugular Cordis sheath and left-sided subclavian line tips in high and low SVC respectively. Median sternotomy. Dual lead pacer. Cardiomegaly accentuated by AP portable technique. 3 left chest tubes remain in place. Small left pleural effusion is similar. There is likely a tiny layering right pleural effusion. No pneumothorax. Mild interstitial edema, accentuated by low lung volumes. Left greater than right base airspace disease is most likely atelectasis. IMPRESSION: No significant change since one day prior. Congestive heart failure with left greater than right pleural effusions and airspace disease. Left chest tubes in place, without pneumothorax. Electronically Signed   By: Kyle  Talbot M.D.   On: 12/26/2019 09:40   DG Chest Port 1 View  Result Date: 12/25/2019 CLINICAL DATA:  Check endotracheal tube placement EXAM: PORTABLE CHEST 1 VIEW COMPARISON:  12/24/2019 FINDINGS: Endotracheal tube, feeding catheter and right jugular sheath are again seen and stable. Three left sided chest tubes are noted. No pneumothorax is noted. Lateral density is noted stable from the prior exam likely representing some loculated fluid. Cardiac shadow is stable. Spacing device is noted. Slight increased density is noted over the bases bilaterally consistent with small effusions. No new focal  confluent infiltrate is seen. IMPRESSION: Overall appearance is stable from the prior study. Electronically Signed   By: Mark  Lukens M.D.   On: 12/25/2019 09:30   DG Chest Port 1 View  Result Date: 12/24/2019 CLINICAL DATA:  64-year-old female status post intubation. EXAM: PORTABLE CHEST 1 VIEW COMPARISON:  Chest x-ray 12/23/2019. FINDINGS: An endotracheal tube is in place with tip 3.1 cm above the carina. A feeding tube is seen extending into the abdomen, however, the tip of the feeding tube extends below the lower margin of the image. There is a right-sided internal jugular central venous catheter with tip terminating in the mid superior vena cava. There is a left upper extremity PICC with tip terminating in the superior cavoatrial junction. 3 left-sided chest tubes are noted, 1 with tip in the medial aspect of the left apex, another with tip in the lateral upper left hemithorax, and the other with tip in the medial left lower hemithorax, similar to the prior study. No appreciable pneumothorax. Retrocardiac opacity completely obscuring the left hemidiaphragm, similar to the prior study, which may reflect atelectasis and/or consolidation. Opacity in the mid to upper left hemithorax laterally   adjacent to the chest tube, likely some loculated fluid. Small to moderate right pleural effusion. Probable subsegmental atelectasis at the right lung base. No evidence of pulmonary edema. Mild cardiomegaly. Upper mediastinal contours are within normal limits. Status post median sternotomy. Right-sided pacemaker device in place with lead tips projecting over the expected location of the right atrium and right ventricle. Surgical clips project over the left chest wall laterally. IMPRESSION: 1. Postoperative changes and support apparatus, as detailed above. 2. Otherwise, there is no significant interval change in the radiographic appearance the chest, as detailed above. Electronically Signed   By: Daniel  Entrikin M.D.   On:  12/24/2019 09:59   DG Chest Port 1 View  Result Date: 12/23/2019 CLINICAL DATA:  Evaluate ET tube placement. EXAM: PORTABLE CHEST 1 VIEW COMPARISON:  12/22/2019 FINDINGS: There is a right chest wall pacer device with leads in the right atrial appendage and right ventricle. Left arm PICC line tip is at the cavoatrial junction. Right IJ catheter tip is in the distal SVC. A feeding tube is in place with tip below the GE junction and below the level of the field of view. The endotracheal tube tip is situated just above the carina. There are 2 left chest tubes in place. Left pleural effusion containing gas is unchanged from prior exam. Increase in right pleural effusion with progressive veil like opacification of the right midlung and right base. IMPRESSION: 1. Increase in right pleural effusion with progressive veil like opacification of the right midlung and right base. 2. ETT tip is situated just above the carina. Electronically Signed   By: Taylor  Stroud M.D.   On: 12/23/2019 09:35   DG CHEST PORT 1 VIEW  Result Date: 12/22/2019 CLINICAL DATA:  Follow-up exam. EXAM: PORTABLE CHEST 1 VIEW COMPARISON:  01/01/2020 and older exams. FINDINGS: There has been no significant interval change in lung aeration since the most recent prior study. Airspace opacification is noted in the left mid to lower lung obscuring the left heart border and left hemidiaphragm. The left upper lung shows hazy opacity and vascular congestion and interstitial thickening. There is hazy opacity, vascular congestion interstitial throughout the right lung. No new lung abnormalities. No pneumothorax. No mediastinal widening. The endotracheal tube, nasogastric tube, right internal jugular Cordis, 2 left-sided chest tubes and left PICC are stable. IMPRESSION: 1. No significant change from the most recent prior study. No pneumothorax. 2. Persistent lung opacities with more dense consolidation or atelectasis on the left and more diffuse pulmonary  pulmonary edema Electronically Signed   By: David  Ormond M.D.   On: 12/22/2019 08:26   DG Chest Portable 1 View  Result Date: 01/01/2020 CLINICAL DATA:  Status post left hemothorax evaluation, previous median sternotomy EXAM: PORTABLE CHEST 1 VIEW COMPARISON:  12/31/2019 FINDINGS: Single frontal view of the chest demonstrates endotracheal tube overlying tracheal air column, tip approximately 2.6 cm above carina. Enteric catheter passes below diaphragm tip excluded by collimation. Right internal jugular Cordis, left-sided PICC, and dual lead pacer unchanged. Left-sided chest tubes are again noted, with decreased left pleural effusion. There is improved aeration of the left lung, with residual consolidation at the left mid and lower lung zones. Increased vascular congestion with perihilar edema since prior study. No pneumothorax. IMPRESSION: 1. Decreased left pleural effusion with improved aeration of the left lung. 2. Slight worsening of volume status with increasing perihilar edema. 3. Support devices as above. Electronically Signed   By: Michael  Brown M.D.   On: 01/01/2020 19:12     DG Chest Portable 1 View  Result Date: 12/16/2019 CLINICAL DATA:  Status post left hemithorax evacuation. EXAM: PORTABLE CHEST 1 VIEW COMPARISON:  December 21, 2019 FINDINGS: Multiple sternal wires are noted. An endotracheal tube is seen with its distal tip approximately 3.4 cm from the carina. There is stable nasogastric tube and left-sided PICC line positioning. Interval right internal jugular venous catheter placement is suspected with its distal tip overlying the distal aspect of the superior vena cava. Interval placement of 3 separate left-sided chest tubes is seen with interval removal of the additional left-sided pleural drainage catheter noted on the prior study. There is complete opacification of the left hemidiaphragm. This represents a new finding when compared to the prior study. Marked severity right to left shift of the  mediastinal structures is seen. The visualized skeletal structures are unremarkable. IMPRESSION: 1. Complete opacification of the left hemidiaphragm with marked severity right to left shift of the mediastinal structures. Findings are consistent with interval left hemithorax evacuation since the prior exam. 2. Multiple left-sided chest tube placement since the prior study. Electronically Signed   By: Thaddeus  Houston M.D.   On: 01/06/2020 18:08   DG CHEST PORT 1 VIEW  Result Date: 01/01/2020 CLINICAL DATA:  Endotracheal tube adjustment. EXAM: PORTABLE CHEST 1 VIEW COMPARISON:  5 minutes ago. FINDINGS: Retraction of the endotracheal tube tip now 3.4 cm from the carina. No other interval change. Enteric tube, left upper extremity PICC, left pigtail catheter remain in place. Consolidation throughout the left hemithorax with lobulated contours superiorly. IMPRESSION: 1. Retraction of endotracheal tube, tip now 3.4 cm from the carina. 2. No other interval change from 5 minutes prior. Electronically Signed   By: Melanie  Sanford M.D.   On: 12/18/2019 03:29   Portable Chest x-ray  Result Date: 01/05/2020 CLINICAL DATA:  Endotracheal tube present. EXAM: PORTABLE CHEST 1 VIEW COMPARISON:  Radiograph yesterday.  Chest CT 11/25/2019 FINDINGS: Endotracheal tube tip in the right mainstem, subsequently retracted. Weighted enteric tube tip below the diaphragm not included in the field of view. Left upper extremity PICC tip projects over the SVC. Right-sided pacemaker in place. Left pigtail catheter in place. Dense consolidation throughout the left hemithorax with lobulated contours superiorly. No definite pneumothorax. Stable heart size, post median sternotomy. Previous right upper lobe opacity is partially obscured by overlying artifacts. IMPRESSION: 1. Endotracheal tube tip in the right mainstem, subsequently retracted. 2. Dense consolidation throughout the left hemithorax with lobulated contours superiorly. Left pigtail  catheter in place. No definite pneumothorax. 3. Previous right upper lobe opacity is partially obscured by overlying artifacts. Electronically Signed   By: Melanie  Sanford M.D.   On: 12/20/2019 03:28   DG Chest Port 1 View  Result Date: 12/20/2019 CLINICAL DATA:  Acute respiratory failure.  Follow-up exam. EXAM: PORTABLE CHEST 1 VIEW COMPARISON:  12/18/2019 and older studies. FINDINGS: New hazy airspace opacities noted in the right upper lobe. Airspace opacities in the left mid lung with more confluent lung base opacity is stable from the prior exam. Stable left lateral chest tube. No pneumothorax. There is a well-defined pleural base opacity along the left mid hemithorax that is likely loculated pleural fluid. Probable small right pleural effusion. Left pleural effusion, stable. Endotracheal tube has been removed. Nasogastric tube has been replaced with an enteric tube passing below the diaphragm and below the included field of view. Stable left PICC and right anterior chest wall sequential pacemaker. IMPRESSION: 1. New right upper lobe hazy airspace lung opacity. This may reflect asymmetric   edema or infection. 2. Left mid and lower lung zone opacity is stable consistent with a combination of pleural fluid with atelectasis or infection or less likely asymmetric edema. 3. Status post removal of the endotracheal and nasogastric tubes. New enteric feeding tube passes below the diaphragm. 4. Focal pleural based opacity along the left mid hemithorax consistent loculated left pleural fluid, which appears new from the prior exam. Electronically Signed   By: Lajean Manes M.D.   On: 12/20/2019 08:41   DG Chest Port 1 View  Result Date: 12/18/2019 CLINICAL DATA:  Intubation, respiratory failure EXAM: PORTABLE CHEST 1 VIEW COMPARISON:  12/17/2019 FINDINGS: Interval retraction of endotracheal tube, projecting just below the thoracic inlet, approximately 6 cm above the carina. Left upper extremity PICC. Otherwise  unchanged examination with left-sided pigtail chest tube and pleural effusion. The right lung is normally aerated. Cardiomegaly. IMPRESSION: 1. Interval retraction of endotracheal tube, projecting just below the thoracic inlet, approximately 6 cm above the carina. 2. Otherwise unchanged examination with left-sided pigtail chest tube and pleural effusion. No new airspace opacity. Electronically Signed   By: Eddie Candle M.D.   On: 12/18/2019 09:36   DG Chest Port 1 View  Result Date: 12/17/2019 CLINICAL DATA:  Endotracheal tube and chest tube status post respiratory failure. EXAM: PORTABLE CHEST 1 VIEW COMPARISON:  12/16/2019 chest radiograph. FINDINGS: ETT is unchanged. Partially imaged NG tube terminates outside field of view. Left PICC tip overlies the superior cavoatrial junction. Left pleural pigtail catheter is unchanged. Right chest wall pacing device. Left lung volume loss with large pleural effusion and confluent pulmonary opacities, unchanged. Post sternotomy sequela. Partially obscured cardiomediastinal silhouette. IMPRESSION: Stable support lines and tubes. Left lung opacities and large pleural effusion, unchanged. Electronically Signed   By: Primitivo Gauze M.D.   On: 12/17/2019 09:19   DG Chest Port 1 View  Result Date: 12/16/2019 CLINICAL DATA:  Left-sided pleural effusion. EXAM: PORTABLE CHEST 1 VIEW COMPARISON:  Dec 15, 2019 FINDINGS: ETT is 2.5 cm above the carina. NG tube is seen below the diaphragm. Left-sided PICC is seen with the tip at the superior cavoatrial junction. A left-sided pigtail catheter is noted. Overlying median sternotomy wires and a right-sided pacemaker seen. Again noted is a large left pleural effusion which is unchanged from the prior exam. There is hazy airspace opacity at the left lung base which may be due to combination of effusion/consolidation. The right lung is clear. No acute osseous abnormality. IMPRESSION: Lines and tubes in stable position. Large left  pleural effusion with left lung base airspace opacity which may be due to layering effusion and/or consolidation. Electronically Signed   By: Prudencio Pair M.D.   On: 12/16/2019 06:06   DG CHEST PORT 1 VIEW  Result Date: 12/15/2019 CLINICAL DATA:  Pleural effusion.  Hypoxia EXAM: PORTABLE CHEST 1 VIEW COMPARISON:  Dec 14, 2019 FINDINGS: Endotracheal tube tip is 3.7 cm above the carina. Nasogastric tube tip and side port are below the diaphragm. Central catheter tip is in the superior vena cava near the cavoatrial junction. No pneumothorax. Chest tube remains on the left without pneumothorax. Large pleural effusion noted on the left, stable, with consolidation throughout portions of the left mid and lower lung zones. The right lung is clear. Heart is mildly enlarged, stable, with pulmonary vascularity normal. No adenopathy. No bone lesions. IMPRESSION: Tube and catheter positions as described without evident pneumothorax. Sizable left pleural effusion with consolidation in portions of the left mid and lower lung zones. Right  lung remains clear. Stable cardiac prominence. Electronically Signed   By: William  Woodruff III M.D.   On: 12/15/2019 09:55   DG CHEST PORT 1 VIEW  Result Date: 12/15/2019 CLINICAL DATA:  Pleural effusion. EXAM: PORTABLE CHEST 1 VIEW COMPARISON:  Dec 14, 2019 FINDINGS: Prominent skin fold over the right lateral chest. No pneumothorax. A left-sided chest tube remains in place. There is a moderate to large left effusion, smaller in the interval. The left PICC line is stable. The ETT is in good position. Stable pacemaker. The NG tube terminates below today's film. No other changes. IMPRESSION: 1. Support apparatus as above. 2. A moderate to large left effusion remains although it is smaller in the interval. There may be a loculated component towards the apex. Electronically Signed   By: David  Williams III M.D   On: 11/23/2019 18:31   DG CHEST PORT 1 VIEW  Result Date:  12/02/2019 CLINICAL DATA:  PICC line EXAM: PORTABLE CHEST 1 VIEW COMPARISON:  Chest radiograph 12/04/2019 FINDINGS: Interval placement of a left upper extremity PICC with tip projecting in the upper right atrium. Otherwise stable support apparatus including endotracheal tube, nasogastric tube, left chest tube and right chest pacer. Stable cardiomediastinal contours. There is persistent complete opacification of the left hemithorax. The right lung is clear. No evidence of pneumothorax. No acute finding in the visualized skeleton. IMPRESSION: 1. Interval placement of a left upper extremity PICC with tip projecting in the upper right atrium. 2. Persistent complete opacification of the left hemithorax. These results will be called to the ordering clinician or representative by the Radiologist Assistant, and communication documented in the PACS or [facility] Dashboard. Electronically Signed   By: Nancy  Ballantyne M.D.   On: 12/08/2019 16:14   DG CHEST PORT 1 VIEW  Result Date: 12/05/2019 CLINICAL DATA:  Respiratory distress EXAM: PORTABLE CHEST 1 VIEW COMPARISON:  Dec 12, 2017 chest radiograph; chest CT Dec 14, 2019 FINDINGS: There is extensive opacification throughout most of the left lung with only a small amount of aeration in the left apex. Right lung is clear. There is stable cardiomegaly with pacemaker leads attached to the right atrium and right ventricle. No adenopathy evident in areas that can be assessed for potential adenopathy. No bone lesions. IMPRESSION: Near complete opacification of the left lung, likely due to combination of consolidation and effusion. Note that CT earlier in the day showed apparent fluid in the left main bronchus. Question aspiration with mucous plugging causing the opacification on the left. This finding may warrant bronchoscopy given the current radiographic findings and recent CT appearance. Right lung clear.  Stable cardiac prominence. Electronically Signed   By: William  Woodruff  III M.D.   On: 12/13/2019 08:26   DG CHEST PORT 1 VIEW  Result Date: 12/13/2019 CLINICAL DATA:  Pleural effusion.  Recent thoracentesis EXAM: PORTABLE CHEST 1 VIEW COMPARISON:  Dec 12, 2019 and may 03/05/2020 FINDINGS: No pneumothorax. Left pleural effusion smaller compared to 1 day prior. There is residual pleural effusion on the left with the patchy consolidation throughout portions of the left mid and lower lung regions. Right lung clear. Heart size and pulmonary vascularity are normal. Pacemaker leads are attached to the right atrium and right ventricle. No adenopathy. No bone lesions. IMPRESSION: Left pleural effusion smaller than 1 day prior. No pneumothorax. There is a combination of residual pleural effusion as well as consolidation throughout the left mid and lower lung zone regions. Right lung clear. Stable cardiac silhouette. Postoperative changes noted. Electronically   Signed   By: William  Woodruff III M.D.   On: 12/13/2019 13:36   EEG adult  Result Date: 12/26/2019 Yadav, Priyanka O, MD     12/26/2019  1:18 PM Patient Name: Emilyn B Wertenberger MRN: [id number] Epilepsy Attending: Priyanka O Yadav Referring Physician/Provider: Brandi Ollis, NP Date: 12/26/2019 Duration: 24.51 mins Patient history: 64-year-old female with right temporal and posterior fossa hematoma s/p hemicraniectomy who was noted to have seizure-like activity.  EEG evaluate for seizures. Level of alertness: comatose AEDs during EEG study: None Technical aspects: This EEG study was done with scalp electrodes positioned according to the 10-20 International system of electrode placement. Electrical activity was acquired at a sampling rate of 500Hz and reviewed with a high frequency filter of 70Hz and a low frequency filter of 1Hz. EEG data were recorded continuously and digitally stored. Description: EEG showed continuous generalized and lateralized right hemisphere 3 to 5 Hz theta-delta slowing.  Sharp transients were also seen in the  right frontocentral region.  Hyperventilation and photic stimulation were not performed.   ABNORMALITY -Continuous slow, generalized and lateralized right hemisphere IMPRESSION: This study is suggestive of cortical dysfunction in the right hemisphere consistent with underlying craniotomy.  Additionally, there is evidence of severe diffuse encephalopathy, nonspecific to etiology.  No seizures or definite epileptiform discharges were seen throughout the recording. If concern for interictal/ictal activity persists, consider long-term EEG. Priyanka O Yadav   Overnight EEG with video  Result Date: 12/27/2019 Yadav, Priyanka O, MD     12/28/2019  8:50 AM Patient Name: Kimiya B Stracke MRN: [id number] Epilepsy Attending: Priyanka O Yadav Referring Physician/Provider: Brandi Ollis, NP Duration: 12/26/2019 1447 to 12/27/2019 1447  Patient history: 64-year-old female with right temporal and posterior fossa hematoma s/p hemicraniectomy who was noted to have seizure-like activity.  EEG evaluate for seizures.  Level of alertness: comatose  AEDs during EEG study: LEV  Technical aspects: This EEG study was done with scalp electrodes positioned according to the 10-20 International system of electrode placement. Electrical activity was acquired at a sampling rate of 500Hz and reviewed with a high frequency filter of 70Hz and a low frequency filter of 1Hz. EEG data were recorded continuously and digitally stored.  Description: EEG showed continuous generalized and lateralized right hemisphere 3 to 5 Hz theta-delta slowing.  Sharp waves were also seen in the right frontocentral region which at times appear rhythmic when patient is stimulated.  Hyperventilation and photic stimulation were not performed.   Event button was pressed on 12/26/2019 at 2332 and on 12/27/2019 0521 during which patient was noted to have left side semi- rhythmic head jerking with eyes open, lasting about 10 mins. Concomitant eeg showed rhythmic 4-6hz theta  slowing admixed with sharp waves which gradually evolved to involve bifrontal region.  ABNORMALITY - Focal seizure, right frontocentral region - Sharp waves, right frontocentral region - Continuous slow, generalized and lateralized right hemisphere  IMPRESSION: This study showed two seizures arising from right frontocentral region during which patient was noted to have left side semi- rhythmic head jerking with eyes open, on 12/26/2019 at 2332 and on 12/27/2019 0521, lasting about 10 mins. Additionally, there is cortical dysfunction in the right hemisphere consistent with underlying craniotomy as well as severe diffuse encephalopathy, nonspecific to etiology.  Priyanka O Yadav   US EKG SITE RITE  Result Date: 12/06/2019 If Site Rite image not attached, placement could not be confirmed due to current cardiac rhythm.   Labs:  CBC: Recent Labs    01/05/20   1832 01/06/20 0437 01/07/20 0419 01/08/20 0412  WBC 10.2 8.9 7.9 8.0  HGB 7.5* 7.1* 7.1* 7.2*  HCT 24.2* 23.1* 23.3* 23.6*  PLT 85* 93* 99* 109*    COAGS: Recent Labs    10/31/19 1744 11/02/19 0243 12/30/2019 0619 01/09/2020 1856 12/22/19 0339 01/06/20 1218  INR 1.2   < > 1.8* 0.9 1.1 1.4*  APTT 27  --  40*  --   --  47*   < > = values in this interval not displayed.    BMP: Recent Labs    01/05/20 1832 01/06/20 0824 01/07/20 0419 01/08/20 0405  NA 135 134* 133* 135  K 3.2* 3.6 4.2 4.1  CL 99 96* 95* 97*  CO2 27 27 24 27  GLUCOSE 138* 154* 154* 145*  BUN 17 33* 57* 36*  CALCIUM 7.4* 7.4* 7.5* 7.6*  CREATININE 1.16* 2.23* 3.24* 2.47*  GFRNONAA 49* 22* 14* 20*  GFRAA 57* 26* 17* 23*    LIVER FUNCTION TESTS: Recent Labs    12/16/2019 1856 01/04/2020 1856 12/23/19 0427 12/23/19 0427 12/25/19 0456 12/26/19 1530 01/04/20 0427 01/05/20 0412 01/05/20 1832 01/06/20 0824  BILITOT 1.4*  --  0.8  --  0.7  --   --   --   --  0.8  AST 262*  --  334*  --  71*  --   --   --   --  209*  ALT 94*  --  150*  --  52*  --   --    --   --  119*  ALKPHOS 123  --  145*  --  129*  --   --   --   --  966*  PROT 5.3*  --  5.9*  --  6.0*  --   --   --   --  7.1  ALBUMIN 2.0*   < > 2.0*   < > 1.7*   < > 1.6* 1.5* 1.4* 1.4*   < > = values in this interval not displayed.    TUMOR MARKERS: No results for input(s): AFPTM, CEA, CA199, CHROMGRNA in the last 8760 hours.  Assessment and Plan:  Acute renal failure secondary to hemorrhagic shock.  Will proceed with image guided placement of a tunneled hemodialysis catheter tomorrow since patient is not NPO. Will hold am heparin.  Risks and benefits discussed with the patient's spouse including, but not limited to bleeding, infection, vascular injury, pneumothorax which may require chest tube placement, air embolism or even death  All questions were answered, patient's spouse is agreeable to proceed. Consent signed and in chart.  Electronically Signed: WENDY S BLAIR, PA-C   01/08/2020, 9:49 AM      I spent a total of    15 Minutes in face to face in clinical consultation, greater than 50% of which was counseling/coordinating care for tunneled HD cathter placement.  

## 2020-01-08 NOTE — Progress Notes (Addendum)
[facility] DAILY ICU PROGRESS NOTE                   [address]            ,[place] [address]          [phone]   18 Days Post-Op Procedure(s) (LRB): Video Assisted Thoracoscopy (Vats) left , Drainage of Hemothorax (Left) Video Bronchoscopy (N/A)  Total Length of Stay:  LOS: 25 days   Subjective: Nonverbal, trach in place and looks for comfortable.   Objective: Vital signs in last 24 hours: Temp:  [99 F (37.2 C)-99.7 F (37.6 C)] 99.2 F (37.3 C) (06/24 0741) Pulse Rate:  [59-94] 65 (06/24 0729) Cardiac Rhythm: Ventricular paced;Atrial fibrillation (06/23 2000) Resp:  [18-35] 31 (06/24 0729) BP: (98-165)/(45-70) 161/70 (06/24 0729) SpO2:  [99 %-100 %] 100 % (06/24 0729) FiO2 (%):  [30 %] 30 % (06/24 0505) Weight:  [61.6 kg-62 kg] 62 kg (06/24 0414)  Filed Weights   01/07/20 0800 01/07/20 1200 01/08/20 0414  Weight: 63.1 kg 61.6 kg 62 kg    Weight change: 0.4 kg   Hemodynamic parameters for last 24 hours:    Intake/Output from previous day: 06/23 0701 - 06/24 0700 In: 2040 [NG/GT:2040] Out: 1600 [Stool:100]  Intake/Output this shift: Total I/O In: 108 [IV Piggyback:108] Out: -   Current Meds: Scheduled Meds: . amantadine  100 mg Per Tube BID  . amiodarone  200 mg Per Tube Daily  . aspirin  325 mg Per Tube Daily  . atorvastatin  40 mg Per Tube Daily  . chlorhexidine gluconate (MEDLINE KIT)  15 mL Mouth Rinse BID  . Chlorhexidine Gluconate Cloth  6 each Topical Q0600  . [START ON 01/09/2020] darbepoetin (ARANESP) injection - DIALYSIS  100 mcg Intravenous Q Fri-HD  . docusate  100 mg Per Tube BID  . feeding supplement (PRO-STAT SUGAR FREE 64)  30 mL Per Tube Daily  . heparin injection (subcutaneous)  5,000 Units Subcutaneous Q8H  . insulin aspart  0-20 Units Subcutaneous Q4H  . insulin glargine  5 Units Subcutaneous BID  . mouth rinse  15 mL Mouth Rinse 10 times per day  . multivitamin  1 tablet Oral QHS  . pantoprazole sodium  40 mg Per  Tube QHS  . phenytoin (DILANTIN) IV  100 mg Intravenous Q8H  . polyethylene glycol  17 g Per Tube TID  . propofol  500 mg Intravenous Once  . senna-docusate  1 tablet Per Tube BID  . sodium chloride flush  10-40 mL Intracatheter Q12H   Continuous Infusions: . sodium chloride Stopped (12/30/19 2139)  . sodium chloride    . sodium chloride    . feeding supplement (VITAL AF 1.2 CAL) 1,000 mL (01/08/20 0601)  . levETIRAcetam Stopped (01/07/20 2211)  . norepinephrine (LEVOPHED) Adult infusion Stopped (01/06/20 1739)   PRN Meds:.sodium chloride, sodium chloride, sodium chloride, acetaminophen, alteplase, bisacodyl, fentaNYL (SUBLIMAZE) injection, heparin, hydrALAZINE, lidocaine (PF), lidocaine-prilocaine, midazolam, ondansetron (ZOFRAN) IV, oxyCODONE, pentafluoroprop-tetrafluoroeth, senna-docusate, sodium chloride flush  General appearance: no distress and opens eyes and moves right hand/arm  Heart: regular rate and rhythm, S1, S2 normal, no murmur, click, rub or gallop Lungs: clear to auscultation bilaterally Abdomen: soft, non-tender; bowel sounds normal; no masses,  no organomegaly Extremities: extremities normal, atraumatic, no cyanosis or edema and left-sided hand edema, improved Wound: clean and dry  Lab Results: CBC: Recent Labs    01/07/20 0419 01/08/20 0412  WBC 7.9 8.0  HGB 7.1* 7.2*  HCT 23.3* 23.6*  PLT 99* 109*   BMET:  Recent Labs    01/07/20 0419 01/08/20 0405  NA 133* 135  K 4.2 4.1  CL 95* 97*  CO2 24 27  GLUCOSE 154* 145*  BUN 57* 36*  CREATININE 3.24* 2.47*  CALCIUM 7.5* 7.6*    CMET: Lab Results  Component Value Date   WBC 8.0 01/08/2020   HGB 7.2 (L) 01/08/2020   HCT 23.6 (L) 01/08/2020   PLT 109 (L) 01/08/2020   GLUCOSE 145 (H) 01/08/2020   CHOL 239 (H) 11/01/2019   TRIG 152 (H) 01/04/2020   HDL 69 11/01/2019   LDLDIRECT 107 (H) 10/02/2012   LDLCALC 158 (H) 11/01/2019   ALT 119 (H) 01/06/2020   AST 209 (H) 01/06/2020   NA 135 01/08/2020     K 4.1 01/08/2020   CL 97 (L) 01/08/2020   CREATININE 2.47 (H) 01/08/2020   BUN 36 (H) 01/08/2020   CO2 27 01/08/2020   TSH 1.414 03/19/2013   INR 1.4 (H) 01/06/2020   HGBA1C 9.7 (H) 11/01/2019      PT/INR:  Recent Labs    01/06/20 1218  LABPROT 16.6*  INR 1.4*   Radiology: DG Chest Port 1 View  Result Date: 01/08/2020 CLINICAL DATA:  Follow-up chest tube EXAM: PORTABLE CHEST 1 VIEW COMPARISON:  01/06/2020 FINDINGS: Cardiac shadow is stable. Postoperative changes are again identified. Feeding catheter, tracheostomy tube, left-sided PICC line and left jugular central line are again seen and stable. Left-sided chest tube is noted with the tip overlying the left apex. No pneumothorax is seen. Persistent pleural thickening is noted on the left. Small left effusion is again identified. Right lung remains clear. Pacing device is noted. IMPRESSION: Overall stable appearance of the chest. No recurrent pneumothorax is noted. Electronically Signed   By: Inez Catalina M.D.   On: 01/08/2020 08:12     Assessment/Plan: S/P Procedure(s) (LRB): Video Assisted Thoracoscopy (Vats) left , Drainage of Hemothorax (Left) Video Bronchoscopy (N/A)  1. S/P trach placement. CXR without pneumothorax. Small left pleural effusion. 2. Keep remaining chest tube in for now until transfer to LTAC 3. Renal-on HD. Lasix challenge today 4. Remains on Cortrak for nutrition. PEG tube needed before LTAC placement.   Plan: continue supportive medical care plan   Elgie Collard 01/08/2020 8:44 AM   patient examined and medical record reviewed,agree with above note. Tharon Aquas Trigt III 01/08/2020

## 2020-01-08 NOTE — Progress Notes (Signed)
[facility] BRIEF SICU PROGRESS NOTE  18 Days Post-Op  S/P Procedure(s) (LRB): Video Assisted Thoracoscopy (Vats) left , Drainage of Hemothorax (Left) Video Bronchoscopy (N/A)   Stable day  Plan: Continue current plan  Rexene Alberts, MD 01/08/2020 5:11 PM

## 2020-01-08 NOTE — Progress Notes (Signed)
Daily Progress Note   Patient Name: Krystal Little       Date: 01/08/2020 DOB: [birth year]  Age: 64 y.o. MRN#: [id number] Attending Physician: Candee Furbish, MD Primary Care Physician: Eulis Foster, MD Admit Date: 11/22/2019  Reason for Consultation/Follow-up: Establishing goals of care  Subjective: No family at bedside. Patient moving R arm and R leg spontaneously, but does not appear to be purposeful. She does squeeze my hand on command. She will track her eyes to my voice, but is easily distracted.   Review of Systems  Unable to perform ROS: Acuity of condition    Length of Stay: 25  Current Medications: Scheduled Meds:   amantadine  100 mg Per Tube BID   amiodarone  200 mg Per Tube Daily   aspirin  325 mg Per Tube Daily   atorvastatin  40 mg Per Tube Daily   chlorhexidine gluconate (MEDLINE KIT)  15 mL Mouth Rinse BID   Chlorhexidine Gluconate Cloth  6 each Topical Q0600   [START ON 01/09/2020] darbepoetin (ARANESP) injection - DIALYSIS  100 mcg Intravenous Q Fri-HD   docusate  100 mg Per Tube BID   feeding supplement (PRO-STAT SUGAR FREE 64)  30 mL Per Tube Daily   heparin injection (subcutaneous)  5,000 Units Subcutaneous Q8H   insulin aspart  0-20 Units Subcutaneous Q4H   insulin glargine  5 Units Subcutaneous BID   mouth rinse  15 mL Mouth Rinse 10 times per day   multivitamin  1 tablet Oral QHS   pantoprazole sodium  40 mg Per Tube QHS   phenytoin (DILANTIN) IV  100 mg Intravenous Q8H   polyethylene glycol  17 g Per Tube TID   propofol  500 mg Intravenous Once   senna-docusate  1 tablet Per Tube BID   sodium chloride flush  10-40 mL Intracatheter Q12H    Continuous Infusions:  sodium chloride Stopped (12/30/19 2139)   sodium  chloride     sodium chloride     feeding supplement (VITAL AF 1.2 CAL) 1,000 mL (01/08/20 0601)   levETIRAcetam Stopped (01/07/20 2211)   norepinephrine (LEVOPHED) Adult infusion Stopped (01/06/20 1739)    PRN Meds: sodium chloride, sodium chloride, sodium chloride, acetaminophen, alteplase, bisacodyl, fentaNYL (SUBLIMAZE) injection, heparin, hydrALAZINE, lidocaine (PF), lidocaine-prilocaine, midazolam, ondansetron (ZOFRAN) IV, oxyCODONE, pentafluoroprop-tetrafluoroeth, senna-docusate, sodium chloride  flush  Physical Exam Vitals and nursing note reviewed.  Constitutional:      Appearance: She is ill-appearing.  HENT:     Head:     Comments: R side craniotomy, visible pulsation present where skull piece is missing Pulmonary:     Comments: Increased RR and effort, trach in place Neurological:     Mental Status: She is alert.     Comments: Nonverbal- no attempts to speak, follows minimal commands (R hand squeeze, slight wiggle of toes)             Vital Signs: BP (!) 170/66    Pulse 63    Temp 98.3 F (36.8 C)    Resp (!) 35    Ht '5\' 4"'  (1.626 m)    Wt 62 kg    SpO2 100%    BMI 23.46 kg/m  SpO2: SpO2: 100 % O2 Device: O2 Device: Tracheostomy Collar O2 Flow Rate: O2 Flow Rate (L/min): 5 L/min  Intake/output summary:   Intake/Output Summary (Last 24 hours) at 01/08/2020 1636 Last data filed at 01/08/2020 0900 Gross per 24 hour  Intake 948 ml  Output 0 ml  Net 948 ml   LBM: Last BM Date: 01/07/20 Baseline Weight: Weight: 61.3 kg Most recent weight: Weight: 62 kg       Palliative Assessment/Data: PPS: 10%      Patient Active Problem List   Diagnosis Date Noted   Acute respiratory failure with hypoxemia (HCC)    Seizures (HCC)    Hemothorax    Aspiration pneumonia (HCC)    Septic shock (HCC)    Goals of care, counseling/discussion    Advanced care planning/counseling discussion    Poor prognosis    Palliative care by specialist    Endotracheally  intubated    Respiratory failure requiring intubation (HCC)    Pleural effusion on left    Hx of CABG    AKI (acute kidney injury) ([place])    Hemorrhagic shock (HCC)    Pleural effusion 11/16/2019   Labile blood pressure    Essential hypertension    Benign essential HTN    Labile blood glucose    Status post craniectomy    Dysphagia, post-stroke    Atrial fibrillation (HCC)    Left hemiparesis (HCC)    Cerebral edema ([place]) 11/20/2019   ICH (intracerebral hemorrhage), SAH ([place]) result of ischemic stroke s/p crani 11/20/2019   Dysphagia due to recent cerebral infarction 11/20/2019   Acute blood loss anemia 11/20/2019   Right middle cerebral artery stroke ([place]) 11/20/2019   History of ETT    Hypokalemia    Acute respiratory failure ([place]) 11/02/2019   Cerebrovascular accident (CVA) due to embolism of right middle cerebral artery ([place]) 10/31/2019   Chronic venous insufficiency 10/07/2019   Wound of RLE  08/18/2019   Hyperlipidemia associated with type 2 diabetes mellitus ([place]) 93/81/8299   Diastolic dysfunction without heart failure 06/02/2015   Health care maintenance 03/24/2013   Hypertension associated with diabetes ([place]) 10/16/2011   Long term current use of anticoagulant therapy 08/27/2010   DM (diabetes mellitus), type 2, uncontrolled ([place]) 02/11/2008   MITRAL REGURGITATION - status post MVR 09/14/1999   S/P MVR (mitral valve repair) 08/09/1999   Atrial fibrillation with RVR ([place]) 08/03/1999   S/P placement of cardiac pacemaker 07/18/1999    Palliative Care Assessment & Plan   Patient Profile: 64 y.o.femalewith past medical history of rhemativ MV disease and prolapse s/p repair, chronic Afib s/p pacemaker placement, DM2, HTN,admitted  initially on 4/16 with large R MCA acute ischemic stroke- underwent percutaneous thrombectomy, had hemorrhagic transformation and underwent decompressive hemicraniectomy on 4/18 and was eventually discharged  5/6 to inpatient rehab with residual deficits of lethargy, dysphagia (d/c'd to CIR with Cortrak placed), R gaze preference, L side diminished muscle tone with LUE flaccidity. Patient had some progression in rehab- was able to sit up in wheelchair, using hemiwalker for limited mobility, dunking basketballs, speech was improving however significant cognitive impairments remained- notably her ability to take in oral nutrition was impaired by prolonged mastication and persistent oral residue, however no overt signs of aspiration. She developed SOB on 5/28 and was found to have pleural effusion on chest xray. PCCM conducted thoracentesis on 5/29 that yielded 1584m of bloody fluid. Shortness of breath persisted and CT scan showed obstruction of her L mainstem bronchus (possibly due to aspiration) with loculated pleural effusion andon5[facility]21[facility]. She developed septic (r/t to aspiration pneumonia and hemothorax) shock and had worsening renal function and hypotension requiring levophed. Extubated on 6/4. 6/6 had episode of severe hypotension, altered mental status, reintubated, restarted on pressors- had large volume output of blood via chestube resulting in hemorrhagic shock. 6/6 had VATS and bronchoscopy to drain hemothorax and clear secretions. 6/6 CT scan head positive for new hemorrhages- no role for neurosurgery. CVVHD started on 6/11 due to progressive worsening renal function. 6/12 EEG noted seizures. She remains virtually unresponsive- spouse and RN did report some purposeful upper extremity movements 6/15. Palliative medicine consulted for assistance with goals of care.   Assessment/Recommendations/Plan   After several conversations with patient's spouse it is clear that there are no limits that will be set on patient's care. I have concerns that Milton's expectations for outcomes will not be met. He is aware of these concerns.  It will  likely be up to providers to set limits (for example- if patient is determined not to be candidate for long term HD- then it will have to not be offered as an intervention)  Noted that [facility] has declined patient for now and spouse declines [facility]- this is likely to become a disposition issue  As goals of care are clear- PMT will shadow and intervene if patient declines or if called- thank you for allowing [facility] participate in this patient's care   Goals of Care and Additional Recommendations:  Limitations on Scope of Treatment: Full Scope Treatment  Code Status:  Full code  Prognosis:   Unable to determine  Discharge Planning:  To Be Determined  Thank you for allowing the Palliative Medicine Team to assist in the care of this patient.   Time In: 1600 Time Out: 1635 Total Time 35 minutes Prolonged Time Billed no      Greater than 50%  of this time was spent counseling and coordinating care related to the above assessment and plan.  KMariana Kaufman AGNP-C Palliative Medicine   Please contact Palliative Medicine Team phone at [phone] questions and concerns.

## 2020-01-08 NOTE — Progress Notes (Signed)
NAME:  Krystal Little, MRN:  [id number], DOB:  [birth year], LOS: 103 ADMISSION DATE:  12/08/2019, CONSULTATION DATE:  5/29 REFERRING MD:  Letta Pate, CHIEF COMPLAINT:  Chest congestion   Brief History   54 female with an extensive cardiac history was admitted to [facility] in April in the setting of a right MCA stroke, had a right interventional radiology guided thrombectomy, later had significant brain edema and underwent craniectomy, ultimately transferred to inpatient rehab.  Pulmonary and critical care medicine was consulted on May 29 in the setting of tachypnea and an enlarging left-sided pleural effusion. Thoracentesis was performed which showed a hemothorax.  A CT chest was then ordered showing obstruction of her left mainstem bronchus and a loculated left pleural effusion.  On May 30 she was moved to the ICU for chest tube placement and bronchoscopy.  Underwent intrapleural TPA but required VATS eventually, course further complicated by ICH , seizures and AKI requiring CRRT  Past Medical History  Status post mitral valve repair 2001 Status post cardiac pacemaker 2001 History of sick sinus syndrome Hypertension Diabetes mellitus type 2 Chronic atrial fibrillation Right MCA stroke 2021  Significant Hospital Events   4/16 IR thrombectomy R MCA stroke 4/25 PCCM sign off 5/6 transfer to [facility] 5/29 PCCM consult left pleural effusion, thoracentesis 5/30 worsening tachypnea. Move to ICU for intubation to facilitate bronchoscopy and chest tube placement 5/31 TPA/ pulmonzyme started for 3 days for persistent left effusion 6/01 Afebrile, low-dose Levophed, Precedex, Minimal output on chest tube- pulmozyme / tpa x 2,  6/02 Weaned x 7 hours yesterday PSV 15/5, pulmozyme and TPA completed 3 days 6/03 afib with RVR placed on amio gtt overnight , 1U PRBC for Hb 7.1  6/05 extubated successfully, Repeat TPA/Pulmonzyme given again  6/06 acute decompensation overnight,  hemorrhagic shock > to OR for left VATS with 2L hemothorax drained.  CT head with new ICH> neurosurgery consulted 6/09 More awake but agitated on PSV weaning  6/12: Seizures + on EEG   Consults:  PCCM  Procedures:  5/29 Thoracentesis >> 1.5 L bloody fluid 5/30 ETT >  5/30 L pigtail chest tube >  Bronchoscopy 5/30 >>  large, thick, grey mucus plug completely occluding the left mainstem bronchus.  [facility] HD cath 6/12 >  Significant Diagnostic Tests:  Thoracentesis left chest 5/29 >> 8K WBC, 78% WBC, protein 4.0, LDH 182 CT chest abdomen pelvis 6/6 >> large left hemithorax, patchy right lung opacities with small right effusion.  No acute abdominal process CT head 6/6 >> evolution of right MCA infarct with hemorrhagic transformation with large intraparenchymal hematoma, 2 additional small hemorrhages in the inferior cerebellum and left temporal lobe.  Trace subarachnoid blood. CT Head 6/10 >> stable hemorrhagic R MCA.  Right hemi-craniectomy with no midline shift or mass effect.  Mildly regressed small cerebellar hemorrhages.  Stable small volume intraventricular hemorrhage, no ventriculomegaly.  Trace subarachnoid hemorrhage largely resolved.  6/12 EEG: right frontocentral seizures X2   Micro Data:   RVP 4/16 neg  MRSA PCR  4/17 neg   ======== 5/29 Left pleural fluid culture > negative 5/29 left pleural fluid cytology >> No malignant cells identified. Blood and acute inflammation.  5/30 BAL >> rare candida albicans 6/06 L Pleural Fluid >> negative 6/06 BCx2 >> negative   Antimicrobials:  Cefepime 4/24 >> 4/25 Zosyn 4/28 >> 5/4 Zosyn 5/30 >> 6/5 Cefepime 6/6 >> 6/12  Interim history/subjective:  Awake, following commands on R. On TC  Objective  Blood pressure (!) 161/70, pulse 65, temperature 99.2 F (37.3 C), resp. rate (!) 31, height _0  (1.626 m), weight 62 kg, SpO2 100 %.    Vent Mode: PRVC FiO2 (%):  [30 %] 30 % Set Rate:  [20 bmp] 20 bmp Vt Set:  [430 mL] 430  mL PEEP:  [5 cmH20] 5 cmH20 Pressure Support:  [10 cmH20] 10 cmH20 Plateau Pressure:  [20 cmH20-22 cmH20] 21 cmH20   Intake/Output Summary (Last 24 hours) at 01/08/2020 0756 Last data filed at 01/08/2020 0600 Gross per 24 hour  Intake 2040 ml  Output 1600 ml  Net 440 ml   Filed Weights   01/07/20 0800 01/07/20 1200 01/08/20 0414  Weight: 63.1 kg 61.6 kg 62 kg    Examination:  GEN: NAD HEENT:trach in place, minimal secretions CV: RRR, ext warm PULM: More clear, no accessory muscle use, left chest tube with no air leak, tidaling GI: Soft, +BS, rectal tube in place EXT: Minimal anasarca NEURO: Moves R side to command, L hemiparesis PSYCH: RASS 0 SKIN: No rashes  L chest tube in place with serosanguinous output H/H stable Cr rising No urine op  Assessment & Plan:   Acute Respiratory Failure with Hypoxemia- trach 6/22 Mucus Plugging, Suspected Aspiration   Left loculated effusion complicated by hemothorax post VATS decortications - TC trials - VAP prevention bundle - Trach sutures out 6/27 - Chest tube can come out prior to [facility] transfer  AKI secondary to shock - no evidence of obstruction on renal US. 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   -No neurosurgical intervention -Supportive care  Status Epilepticus, likely related to underlying stroke history :EEG off 6/14 -Keppra and Dilantin per neuro, as needed Ativan for seizure-like activity  DM2 with Hyperglycemia -CBG, SSI plus Lantus 5 q 12  Chronic Atrial Fibrillation s/p Pacemaker -Telemetry -Full dose aspirin and subQ heparin will have to do for foreseeable future - Oral amiodarone  Moderate Protein Calorie Malnutrition  -Continue tube feeding  Goals of Care -Full code, stable for LTACH  Best practice:  Diet: TF   Pain/Anxiety/Delirium protocol (if indicated): fent prn VAP protocol (if indicated): yes DVT prophylaxis: Heparin subQ GI prophylaxis: PPI Glucose control: SSI Mobility: BR Code Status: full code Family Communication: will update when they come in Disposition:  Awaiting LTACH bed  The patient is critically ill with multiple organ systems failure and requires high complexity decision making for assessment and support, frequent evaluation and titration of therapies, application of advanced monitoring technologies and extensive interpretation of multiple databases. Critical Care Time devoted to patient care services described in this note independent of APP/resident time (if applicable)  is 35 minutes.   Erskine Emery MD [facility] 01/08/2020 7:56 AM Personal pager: [phone] If unanswered, please page CCM On-call: [phone]

## 2020-01-08 NOTE — TOC Transition Note (Addendum)
Transition of Care [facility]) - CM/SW Discharge Note   Patient Details  Name: Krystal Little MRN: [id number] Date of Birth: 1955-11-17  [facility] of [facility]) CM/SW Contact:  Curlene Labrum, RN Phone Number: 01/08/2020, 11:44 AM   Clinical Narrative:    Barrett Shell, RNCM with [facility] stated that they were unable to accept the patient due to bed availability and patient's neurological condition.  I will reach out to the husband and notify him that I've contacted [facility] as the second choice for placement.  6/24- 1230 - I spoke with the patient's husband and the husband politely refused LTAc placement for [facility].  I educated the husband regarding other options outside of [place] for LTAc placement.  The husband would prefer to seek placement at [facility].   Final next level of care: Long Term Acute Care (LTAC) Barriers to Discharge: Continued Medical Work up (Patient received trach yesterday and is starting trach trials today.  No PEG at this time.)   Patient Goals and CMS Choice Patient states their goals for this hospitalization and ongoing recovery are:: Patient's husband is open to patient's transfer to [name] CMS Medicare.gov Compare Post Acute Care list provided to:: Patient Represenative (must comment) (husband) Choice offered to / list presented to : Spouse  Discharge Placement                       Discharge Plan and Services   Discharge Planning Services: CM Consult Post Acute Care Choice: Long Term Acute Care (LTAC) (Prefers [facility]. [facility] and [facility]. [facility])                               Social Determinants of Health (SDOH) Interventions     Readmission Risk Interventions Readmission Risk Prevention Plan 01/07/2020  Transportation Screening Complete  Medication Review Press photographer) Complete  PCP or Specialist appointment within 3-5 days of discharge Complete  HRI or Home Care  Consult Complete  SW Recovery Care/Counseling Consult Complete  Palliative Care Screening Complete  Skilled Nursing Facility Complete  Some recent data might be hidden

## 2020-01-08 NOTE — Evaluation (Signed)
Physical Therapy Evaluation Patient Details Name: Krystal Little MRN: [id number] DOB: 01-26-1956 Today's Date: 01/08/2020   History of Present Illness  Pt is 64 y.o. female initially admitted for stroke. 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 Pt s/p R hemicraniectomy with bone flap placed in abdomen on 4/18.  PMH HTN, DM2, chronic a. Fib (coumadin) s/p pace maker.  She went to inpatient rehab and on May 29 readmitted to hospital in the setting of tachypnea and an enlarging left-sided pleural effusion. Thoracentesis was performed which showed a hemothorax.  A CT chest was then ordered showing obstruction of her left mainstem bronchus and a loculated left pleural effusion. She had bronchoscopy and L chest tube placed 5/30.  She was intubated 5/30-12/19/19. Pt reintubated 6/6 and underwent lt thoracotomy to drain hemothorax. Post op found to have craniotomy area fuller and CT showed  hemorrhage into the posterior R MCA distribution. Pt developed AKI and CRRT 6/11- 6/20. Intermittent HD initiated. Trach placed 6/22.   Clinical Impression  Pt admitted with above diagnosis and presents to PT with functional limitations due to deficits listed below (See PT problem list). Pt needs skilled PT to maximize independence and safety to allow discharge to [facility].      Follow Up Recommendations LTACH    Equipment Recommendations  Other (comment) (To be determined at next venue)    Recommendations for Other Services       Precautions / Restrictions Precautions Precautions: Fall Precaution Comments: R hemicraniectomy- bone flap in R abdomen, needs helmet for OOB,       Mobility  Bed Mobility                  Transfers                    Ambulation/Gait                Stairs            Wheelchair Mobility    Modified Rankin (Stroke Patients Only) Modified Rankin (Stroke Patients Only) Pre-Morbid Rankin Score: No symptoms Modified Rankin:  Severe disability     Balance                                             Pertinent Vitals/Pain Pain Assessment: Faces Faces Pain Scale: No hurt    Home Living Family/patient expects to be discharged to:: Other (Comment) (LTACH) Living Arrangements: Spouse/significant other;Children;Other relatives Available Help at Discharge: Family;Available 24 hours/day Type of Home: House Home Access: Stairs to enter   [employer] of Steps: 2 Home Layout: One level Home Equipment: None      Prior Function Level of Independence: Independent               Hand Dominance   Dominant Hand: Right    Extremity/Trunk Assessment                Communication   Communication: Tracheostomy  Cognition Arousal/Alertness: Awake/alert (with stimuli) Behavior During Therapy: Flat affect Overall Cognitive Status: Difficult to assess                         Following Commands: Follows one step commands inconsistently;Follows one step commands with increased time (with rt side)  General Comments      Exercises General Exercises - Upper Extremity Shoulder Flexion: Right;AAROM;10 reps;Left;PROM;Supine Elbow Flexion: AAROM;Right;5 reps;PROM;Left;Supine Elbow Extension: AAROM;Right;10 reps;Left;PROM;Supine General Exercises - Lower Extremity Ankle Circles/Pumps: Right;AAROM;5 reps;Left;PROM;Supine Short Arc Quad: Right;AAROM;10 reps;Supine Heel Slides: Right;AAROM;10 reps;Left;PROM;Supine Hip ABduction/ADduction: Right;AAROM;10 reps;Left;PROM;Supine   Assessment/Plan    PT Assessment Patient needs continued PT services  PT Problem List Decreased strength;Decreased activity tolerance;Decreased balance;Decreased mobility;Decreased cognition       PT Treatment Interventions DME instruction;Functional mobility training;Therapeutic activities;Balance training;Therapeutic exercise;Neuromuscular re-education;Cognitive  remediation;Patient/family education    PT Goals (Current goals can be found in the Care Plan section)  Acute Rehab PT Goals Patient Stated Goal: Pt trached PT Goal Formulation: Patient unable to participate in goal setting Time For Goal Achievement: 01/22/20 Potential to Achieve Goals: Fair    Frequency Min 2X/week   Barriers to discharge        Co-evaluation               AM-PAC PT "6 Clicks" Mobility  Outcome Measure Help needed turning from your back to your side while in a flat bed without using bedrails?: Total Help needed moving from lying on your back to sitting on the side of a flat bed without using bedrails?: Total Help needed moving to and from a bed to a chair (including a wheelchair)?: Total Help needed standing up from a chair using your arms (e.g., wheelchair or bedside chair)?: Total Help needed to walk in hospital room?: Total Help needed climbing 3-5 steps with a railing? : Total 6 Click Score: 6    End of Session Equipment Utilized During Treatment: Oxygen;Other (comment) (trach collar) Activity Tolerance: Patient tolerated treatment well Patient left: in bed;with call bell/phone within reach   PT Visit Diagnosis: Other abnormalities of gait and mobility (R26.89);Hemiplegia and hemiparesis Hemiplegia - Right/Left: Left Hemiplegia - dominant/non-dominant: Non-dominant Hemiplegia - caused by: Cerebral infarction;Nontraumatic intracerebral hemorrhage    Time: 8250-0370 PT Time Calculation (min) (ACUTE ONLY): 20 min   Charges:   PT Evaluation $PT Eval Moderate Complexity: [place] Pager [phone] Office [place] 01/08/2020, 5:40 PM

## 2020-01-08 NOTE — Progress Notes (Signed)
Krystal Little Progress Note    Assessment/ Plan:    Acute kidney injury, oliguric ischemic ATN secondary to hemorrhagic shock requiring pressor support.  Refractory to IV diuretics initiated on CRRT 12/26/2019- 6/20.  First IHD 6/21. IHD  6/23, next planned for 6/25 if no signs of recovery.  Will do Lasix challenge today and bladder scan to follow.  Would like to tunnel HD cath as well.  IR c/s placed.  Please note that she is a very poor long term candidate for hemodialysis; plan is for full scope of care   L sided hemothorax: s/p VATS with thoracic surgery 6/6.  S/p removal of one chest tube 6/21.     R MCA stroke: s/p thrombectomy 1/22 complicated by worsening cerebral edema with mass effect s/p decompressive right hemicraniectomy and placement of bone flap on 11/02/19.  Had new ICH 6/6.    Acute hypoxic RF: per PCCM.  S/p trach 6/22    Chronic atrial fibrillation: on amiodarone and full ASA   Seizures: continues Dilantin and Keppra per neuro   Dispo: pending  Subjective:    Tolerated HD well yesterday.      Objective:   BP (!) 161/70   Pulse 65   Temp 99.2 F (37.3 C)   Resp (!) 31   Ht 5' 4" (1.626 m)   Wt 62 kg   SpO2 100%   BMI 23.46 kg/m   Intake/Output Summary (Last 24 hours) at 01/08/2020 4825 Last data filed at 01/08/2020 0800 Gross per 24 hour  Intake 2148 ml  Output 1600 ml  Net 548 ml   Weight change: 0.4 kg  Physical Exam: Gen: sitting in bed, NAD HEENT: R hemicraniectomy, trach in place, c/d/i CVS: RRR Resp: clear bilaterally anteriorly Abd: soft Ext: no LE edema ACCESS: L IJ nontunneled HD cath  Imaging: DG Chest Port 1 View  Result Date: 01/08/2020 CLINICAL DATA:  Follow-up chest tube EXAM: PORTABLE CHEST 1 VIEW COMPARISON:  01/06/2020 FINDINGS: Cardiac shadow is stable. Postoperative changes are again identified. Feeding catheter, tracheostomy tube, left-sided PICC line and left jugular central line are again seen and  stable. Left-sided chest tube is noted with the tip overlying the left apex. No pneumothorax is seen. Persistent pleural thickening is noted on the left. Small left effusion is again identified. Right lung remains clear. Pacing device is noted. IMPRESSION: Overall stable appearance of the chest. No recurrent pneumothorax is noted. Electronically Signed   By: Inez Catalina M.D.   On: 01/08/2020 08:12   DG Chest Port 1 View  Result Date: 01/06/2020 CLINICAL DATA:  Tracheostomy tube placement EXAM: PORTABLE CHEST 1 VIEW COMPARISON:  Earlier same day FINDINGS: 1646 hours. Tracheostomy tube visualized in situ with tip position approximately 4.8 cm above the base of the carina. A feeding tube passes into the stomach although the distal tip position is not included on the film. Left IJ central line tip overlies the mid SVC level. Left chest tube remains in place without evidence for left-sided pneumothorax. Pleuroparenchymal opacity at the lower left hemithorax is stable with left pleural effusion. The cardio pericardial silhouette is enlarged. Diffuse interstitial coarsening noted right lung. Right permanent pacemaker evident. Telemetry leads overlie the chest. IMPRESSION: 1. New tracheostomy tube with tip position approximately 4.8 cm above the base of the carina. 2. No substantial interval change in exam. Left chest tube remains in place without evidence for left-sided pneumothorax. 3. Pleuroparenchymal opacity at the lower left hemithorax with left pleural effusion. Electronically Signed  By: Misty Stanley M.D.   On: 01/06/2020 17:18    Labs: BMET Recent Labs  Lab 01/03/20 0359 01/03/20 0359 01/03/20 1620 01/04/20 0427 01/05/20 0412 01/05/20 1832 01/06/20 0824 01/07/20 0419 01/08/20 0405  NA 132*   < > 134* 132* 133* 135 134* 133* 135  K 4.4   < > 4.8 4.5 5.8* 3.2* 3.6 4.2 4.1  CL 100   < > 101 99 100 99 96* 95* 97*  CO2 25   < > _0 GLUCOSE 180*   < > 110* 125* 187* 138* 154*  154* 145*  BUN 20   < > 22 21 49* 17 33* 57* 36*  CREATININE 1.02*   < > 1.04* 0.94 2.23* 1.16* 2.23* 3.24* 2.47*  CALCIUM 7.3*   < > 7.4* 7.4* 7.2* 7.4* 7.4* 7.5* 7.6*  PHOS 1.7*  --  3.7 2.0* 2.3* 1.7*  --  4.9* 4.0   < > = values in this interval not displayed.   CBC Recent Labs  Lab 01/05/20 1832 01/06/20 0437 01/07/20 0419 01/08/20 0412  WBC 10.2 8.9 7.9 8.0  HGB 7.5* 7.1* 7.1* 7.2*  HCT 24.2* 23.1* 23.3* 23.6*  MCV 97.2 97.9 100.0 100.0  PLT 85* 93* 99* 109*    Medications:    . amantadine  100 mg Per Tube BID  . amiodarone  200 mg Per Tube Daily  . aspirin  325 mg Per Tube Daily  . atorvastatin  40 mg Per Tube Daily  . chlorhexidine gluconate (MEDLINE KIT)  15 mL Mouth Rinse BID  . Chlorhexidine Gluconate Cloth  6 each Topical Q0600  . [START ON 01/09/2020] darbepoetin (ARANESP) injection - DIALYSIS  100 mcg Intravenous Q Fri-HD  . docusate  100 mg Per Tube BID  . feeding supplement (PRO-STAT SUGAR FREE 64)  30 mL Per Tube Daily  . heparin injection (subcutaneous)  5,000 Units Subcutaneous Q8H  . insulin aspart  0-20 Units Subcutaneous Q4H  . insulin glargine  5 Units Subcutaneous BID  . mouth rinse  15 mL Mouth Rinse 10 times per day  . multivitamin  1 tablet Oral QHS  . pantoprazole sodium  40 mg Per Tube QHS  . phenytoin (DILANTIN) IV  100 mg Intravenous Q8H  . polyethylene glycol  17 g Per Tube TID  . propofol  500 mg Intravenous Once  . senna-docusate  1 tablet Per Tube BID  . sodium chloride flush  10-40 mL Intracatheter Q12H      Madelon Lips, MD 01/08/2020, 8:28 AM

## 2020-01-09 ENCOUNTER — Inpatient Hospital Stay (HOSPITAL_COMMUNITY): Payer: Medicare Other

## 2020-01-09 LAB — GLUCOSE, CAPILLARY
Glucose-Capillary: 118 mg/dL — ABNORMAL HIGH (ref 70–99)
Glucose-Capillary: 119 mg/dL — ABNORMAL HIGH (ref 70–99)
Glucose-Capillary: 148 mg/dL — ABNORMAL HIGH (ref 70–99)
Glucose-Capillary: 165 mg/dL — ABNORMAL HIGH (ref 70–99)
Glucose-Capillary: 79 mg/dL (ref 70–99)
Glucose-Capillary: 84 mg/dL (ref 70–99)

## 2020-01-09 LAB — BASIC METABOLIC PANEL
Anion gap: 17 — ABNORMAL HIGH (ref 5–15)
BUN: 65 mg/dL — ABNORMAL HIGH (ref 8–23)
CO2: 22 mmol/L (ref 22–32)
Calcium: 7.8 mg/dL — ABNORMAL LOW (ref 8.9–10.3)
Chloride: 97 mmol/L — ABNORMAL LOW (ref 98–111)
Creatinine, Ser: 3.86 mg/dL — ABNORMAL HIGH (ref 0.44–1.00)
GFR calc Af Amer: 13 mL/min — ABNORMAL LOW (ref >60)
GFR calc non Af Amer: 12 mL/min — ABNORMAL LOW (ref >60)
Glucose, Bld: 109 mg/dL — ABNORMAL HIGH (ref 70–99)
Potassium: 5.1 mmol/L (ref 3.5–5.1)
Sodium: 136 mmol/L (ref 135–145)

## 2020-01-09 LAB — PHOSPHORUS: Phosphorus: 6.7 mg/dL — ABNORMAL HIGH (ref 2.5–4.6)

## 2020-01-09 LAB — MAGNESIUM: Magnesium: 2.4 mg/dL (ref 1.7–2.4)

## 2020-01-09 MED ORDER — GERHARDT'S BUTT CREAM
TOPICAL_CREAM | Freq: Three times a day (TID) | CUTANEOUS | Status: DC
  Administered 2020-01-13 – 2020-01-30 (×20): 1 via TOPICAL
  Filled 2020-01-09 (×5): qty 1

## 2020-01-09 MED ORDER — INSULIN GLARGINE 100 UNIT/ML ~~LOC~~ SOLN
5.0000 [IU] | Freq: Two times a day (BID) | SUBCUTANEOUS | Status: DC
  Administered 2020-01-10 – 2020-01-12 (×6): 5 [IU] via SUBCUTANEOUS
  Filled 2020-01-09 (×8): qty 0.05

## 2020-01-09 MED ORDER — DARBEPOETIN ALFA 100 MCG/0.5ML IJ SOSY
PREFILLED_SYRINGE | INTRAMUSCULAR | Status: AC
  Administered 2020-01-09: 100 ug via INTRAVENOUS
  Filled 2020-01-09: qty 0.5

## 2020-01-09 NOTE — Progress Notes (Signed)
[facility] Progress Note    Assessment/ Plan:    Acute kidney injury, oliguric ischemic ATN secondary to hemorrhagic shock requiring pressor support.  Refractory to IV diuretics initiated on CRRT 12/26/2019- 6/20.  First IHD 6/21. IHD  6/23, next planned for 6/25, no signs of renal recovery and not much output with Lasix challenge yesterday  Please note that she is a very poor long term candidate for hemodialysis; plan is for full scope of care.  TDC placement today with IR   L sided hemothorax: s/p VATS with thoracic surgery 6/6.  S/p removal of one chest tube 6/21.     R MCA stroke: s/p thrombectomy 0/62 complicated by worsening cerebral edema with mass effect s/p decompressive right hemicraniectomy and placement of bone flap on 11/02/19.  Had new ICH 6/6.    Acute hypoxic RF: per PCCM.  S/p trach 6/22, weaning as tolerated    Chronic atrial fibrillation: on amiodarone and full ASA   Seizures: continues Dilantin and Keppra per neuro   Dispo: pending.  [facility] has denied pt and family does not want to go to [facility].  If full scope care is the plan and she remains dialysis dependent with a trach, I forsee an out of state facility being her only options.  I greatly appreciate palliative care.  Subjective:    On HD today, plan for [facility] with IR today, appreciate assistance   Objective:   BP (!) 123/52   Pulse 66   Temp 98.5 F (36.9 C) (Oral)   Resp (!) 27   Ht '5\' 4"'  (1.626 m)   Wt 63.1 kg   SpO2 100%   BMI 23.88 kg/m   Intake/Output Summary (Last 24 hours) at 01/09/2020 0912 Last data filed at 01/09/2020 0400 Gross per 24 hour  Intake --  Output 10 ml  Net -10 ml   Weight change: 0 kg  Physical Exam: Gen: sitting in bed, NAD HEENT: R hemicraniectomy, trach in place, c/d/i CVS: RRR Resp: clear bilaterally anteriorly Abd: soft Ext: no LE edema ACCESS: L IJ nontunneled HD cath  Imaging: DG Chest Port 1 View  Result Date: 01/08/2020 CLINICAL DATA:   Follow-up chest tube EXAM: PORTABLE CHEST 1 VIEW COMPARISON:  01/06/2020 FINDINGS: Cardiac shadow is stable. Postoperative changes are again identified. Feeding catheter, tracheostomy tube, left-sided PICC line and left jugular central line are again seen and stable. Left-sided chest tube is noted with the tip overlying the left apex. No pneumothorax is seen. Persistent pleural thickening is noted on the left. Small left effusion is again identified. Right lung remains clear. Pacing device is noted. IMPRESSION: Overall stable appearance of the chest. No recurrent pneumothorax is noted. Electronically Signed   By: Inez Catalina M.D.   On: 01/08/2020 08:12    Labs: BMET Recent Labs  Lab 01/03/20 1620 01/03/20 1620 01/04/20 0427 01/05/20 0412 01/05/20 1832 01/06/20 0824 01/07/20 0419 01/08/20 0405 01/09/20 0443  NA 134*   < > 132* 133* 135 134* 133* 135 136  K 4.8   < > 4.5 5.8* 3.2* 3.6 4.2 4.1 5.1  CL 101   < > 99 100 99 96* 95* 97* 97*  CO2 26   < > '26 22 27 27 24 27 22  ' GLUCOSE 110*   < > 125* 187* 138* 154* 154* 145* 109*  BUN 22   < > 21 49* 17 33* 57* 36* 65*  CREATININE 1.04*   < > 0.94 2.23* 1.16* 2.23* 3.24* 2.47* 3.86*  CALCIUM 7.4*   < > 7.4* 7.2* 7.4* 7.4* 7.5* 7.6* 7.8*  PHOS 3.7  --  2.0* 2.3* 1.7*  --  4.9* 4.0 6.7*   < > = values in this interval not displayed.   CBC Recent Labs  Lab 01/05/20 1832 01/06/20 0437 01/07/20 0419 01/08/20 0412  WBC 10.2 8.9 7.9 8.0  HGB 7.5* 7.1* 7.1* 7.2*  HCT 24.2* 23.1* 23.3* 23.6*  MCV 97.2 97.9 100.0 100.0  PLT 85* 93* 99* 109*    Medications:    . amantadine  100 mg Per Tube BID  . amiodarone  200 mg Per Tube Daily  . aspirin  325 mg Per Tube Daily  . atorvastatin  40 mg Per Tube Daily  . chlorhexidine gluconate (MEDLINE KIT)  15 mL Mouth Rinse BID  . Chlorhexidine Gluconate Cloth  6 each Topical Q0600  . darbepoetin (ARANESP) injection - DIALYSIS  100 mcg Intravenous Q Fri-HD  . docusate  100 mg Per Tube BID  .  feeding supplement (PRO-STAT SUGAR FREE 64)  30 mL Per Tube Daily  . heparin injection (subcutaneous)  5,000 Units Subcutaneous Q8H  . insulin aspart  0-20 Units Subcutaneous Q4H  . [START ON 01/10/2020] insulin glargine  5 Units Subcutaneous BID  . mouth rinse  15 mL Mouth Rinse 10 times per day  . multivitamin  1 tablet Oral QHS  . pantoprazole sodium  40 mg Per Tube QHS  . phenytoin (DILANTIN) IV  100 mg Intravenous Q8H  . polyethylene glycol  17 g Per Tube TID  . propofol  500 mg Intravenous Once  . senna-docusate  1 tablet Per Tube BID  . sodium chloride flush  10-40 mL Intracatheter Q12H      Madelon Lips, MD 01/09/2020, 9:12 AM

## 2020-01-09 NOTE — Progress Notes (Signed)
NAME:  Krystal Little, MRN:  [id number], DOB:  01-22-1956, LOS: 38 ADMISSION DATE:  12/15/2019, CONSULTATION DATE:  5/29 REFERRING MD:  Letta Pate, CHIEF COMPLAINT:  Chest congestion   Brief History   77 female with an extensive cardiac history was admitted to [facility] in April in the setting of a right MCA stroke, had a right interventional radiology guided thrombectomy, later had significant brain edema and underwent craniectomy, ultimately transferred to inpatient rehab.  Pulmonary and critical care medicine was consulted on May 29 in the setting of tachypnea and an enlarging left-sided pleural effusion. Thoracentesis was performed which showed a hemothorax.  A CT chest was then ordered showing obstruction of her left mainstem bronchus and a loculated left pleural effusion.  On May 30 she was moved to the ICU for chest tube placement and bronchoscopy.  Underwent intrapleural TPA but required VATS eventually, course further complicated by ICH , seizures and AKI requiring CRRT  Past Medical History  Status post mitral valve repair 2001 Status post cardiac pacemaker 2001 History of sick sinus syndrome Hypertension Diabetes mellitus type 2 Chronic atrial fibrillation Right MCA stroke 2021  Significant Hospital Events   4/16 IR thrombectomy R MCA stroke 4/25 PCCM sign off 5/6 transfer to [facility] 5/29 PCCM consult left pleural effusion, thoracentesis 5/30 worsening tachypnea. Move to ICU for intubation to facilitate bronchoscopy and chest tube placement 5/31 TPA/ pulmonzyme started for 3 days for persistent left effusion 6/01 Afebrile, low-dose Levophed, Precedex, Minimal output on chest tube- pulmozyme / tpa x 2,  6/02 Weaned x 7 hours yesterday PSV 15/5, pulmozyme and TPA completed 3 days 6/03 afib with RVR placed on amio gtt overnight , 1U PRBC for Hb 7.1  6/05 extubated successfully, Repeat TPA/Pulmonzyme given again  6/06 acute decompensation overnight,  hemorrhagic shock > to OR for left VATS with 2L hemothorax drained.  CT head with new ICH> neurosurgery consulted 6/09 More awake but agitated on PSV weaning  6/12: Seizures + on EEG   Consults:  PCCM  Procedures:  5/29 Thoracentesis >> 1.5 L bloody fluid 5/30 ETT >  5/30 L pigtail chest tube >  Bronchoscopy 5/30 >>  large, thick, grey mucus plug completely occluding the left mainstem bronchus.  [facility] HD cath 6/12 >  Significant Diagnostic Tests:  Thoracentesis left chest 5/29 >> 8K WBC, 78% WBC, protein 4.0, LDH 182 CT chest abdomen pelvis 6/6 >> large left hemithorax, patchy right lung opacities with small right effusion.  No acute abdominal process CT head 6/6 >> evolution of right MCA infarct with hemorrhagic transformation with large intraparenchymal hematoma, 2 additional small hemorrhages in the inferior cerebellum and left temporal lobe.  Trace subarachnoid blood. CT Head 6/10 >> stable hemorrhagic R MCA.  Right hemi-craniectomy with no midline shift or mass effect.  Mildly regressed small cerebellar hemorrhages.  Stable small volume intraventricular hemorrhage, no ventriculomegaly.  Trace subarachnoid hemorrhage largely resolved.  6/12 EEG: right frontocentral seizures X2   Micro Data:   RVP 4/16 neg  MRSA PCR  4/17 neg   ======== 5/29 Left pleural fluid culture > negative 5/29 left pleural fluid cytology >> No malignant cells identified. Blood and acute inflammation.  5/30 BAL >> rare candida albicans 6/06 L Pleural Fluid >> negative 6/06 BCx2 >> negative   Antimicrobials:  Cefepime 4/24 >> 4/25 Zosyn 4/28 >> 5/4 Zosyn 5/30 >> 6/5 Cefepime 6/6 >> 6/12  Interim history/subjective:  Tolerated TC x 22 hours then tired out. Now sleepy on  PS. Moving R side.  Objective   Blood pressure (!) 136/50, pulse 65, temperature 98.5 F (36.9 C), temperature source Oral, resp. rate (!) 29, height _0  (1.626 m), weight 63.1 kg, SpO2 100 %.    Vent Mode: PRVC FiO2 (%):  [30  %] 30 % Set Rate:  [20 bmp] 20 bmp Vt Set:  [430 mL] 430 mL PEEP:  [5 cmH20] 5 cmH20 Plateau Pressure:  [18 cmH20] 18 cmH20   Intake/Output Summary (Last 24 hours) at 01/09/2020 0859 Last data filed at 01/09/2020 0400 Gross per 24 hour  Intake 0 ml  Output 10 ml  Net -10 ml   Filed Weights   01/07/20 1200 01/08/20 0414 01/09/20 0630  Weight: 61.6 kg 62 kg 63.1 kg    Examination:  GEN: NAD HEENT:trach in place, minimal secretions CV: RRR, ext warm PULM: More clear, no accessory muscle use, left chest tube with no air leak, tidaling GI: Soft, +BS, rectal tube in place EXT: Minimal anasarca NEURO: Moves R side but not following commands today, L hemiparesis PSYCH: RASS 0 SKIN: No rashes  L chest tube in place with serosanguinous output Labs reviewed  Assessment & Plan:   Acute Respiratory Failure with Hypoxemia- trach 6/22 Mucus Plugging, Suspected Aspiration   Left loculated effusion complicated by hemothorax post VATS decortications - TC trials once she recovers - VAP prevention bundle - Trach sutures out 6/27 - Chest tube can come out prior to [facility] transfer  AKI secondary to shock - no evidence of obstruction on renal US. 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   -No neurosurgical intervention -Supportive care -At some point will need PEG  Status Epilepticus, likely related to underlying stroke history :EEG off 6/14 -Keppra and Dilantin per neuro, as needed Ativan for seizure-like activity  DM2 with Hyperglycemia -CBG, SSI plus Lantus 5 q 12, retime to tomorrow given TF on hold  Chronic Atrial Fibrillation s/p Pacemaker -Telemetry -Full dose aspirin and subQ heparin will have to do for foreseeable future - Oral amiodarone  Moderate Protein Calorie Malnutrition  -Continue tube feeding once  tunneled line in  Goals of Care -Full code, stable for LTACH  Best practice:  Diet: TF  Pain/Anxiety/Delirium protocol (if indicated): fent prn VAP protocol (if indicated): yes DVT prophylaxis: Heparin subQ GI prophylaxis: PPI Glucose control: see above Mobility: BR Code Status: full code Family Communication: will update when they come in Disposition:  Awaiting LTACH bed, some issues on family preference  The patient is critically ill with multiple organ systems failure and requires high complexity decision making for assessment and support, frequent evaluation and titration of therapies, application of advanced monitoring technologies and extensive interpretation of multiple databases. Critical Care Time devoted to patient care services described in this note independent of APP/resident time (if applicable)  is 33 minutes.   Erskine Emery MD [facility] 01/09/2020 8:59 AM Personal pager: [phone] If unanswered, please page CCM On-call: [phone]

## 2020-01-09 NOTE — Progress Notes (Signed)
SLP Cancellation Note  Patient Details Name: Krystal Little MRN: [id number] DOB: September 09, 1955   Cancelled treatment:       Reason Eval/Treat Not Completed: Patient at procedure or test/unavailable (Pt having HD at this time. SLP will follow up.)  Breindel Collier I. [name], [place], [place] Office number [phone] Pager [place] 01/09/2020, 8:58 AM

## 2020-01-09 NOTE — Progress Notes (Addendum)
      [address]       ,[place] [address]             [phone]      19 Days Post-Op Procedure(s) (LRB): Video Assisted Thoracoscopy (Vats) left , Drainage of Hemothorax (Left) Video Bronchoscopy (N/A) Subjective: Unresponsive this morning.  Objective: Vital signs in last 24 hours: Temp:  [97.8 F (36.6 C)-98.8 F (37.1 C)] 98.5 F (36.9 C) (06/25 0802) Pulse Rate:  [60-71] 63 (06/25 0800) Cardiac Rhythm: Ventricular paced (06/24 2000) Resp:  [21-49] 28 (06/25 0800) BP: (117-201)/(46-79) 120/52 (06/25 0800) SpO2:  [97 %-100 %] 100 % (06/25 0800) FiO2 (%):  [30 %] 30 % (06/25 0423) Weight:  [63.1 kg] 63.1 kg (06/25 0630)     Intake/Output from previous day: 06/24 0701 - 06/25 0700 In: 108 [IV Piggyback:108] Out: 10 [Chest Tube:10] Intake/Output this shift: No intake/output data recorded.  General appearance: calm, nonresponsive Heart: regular rate and rhythm, S1, S2 normal, no murmur, click, rub or gallop Lungs: clear to auscultation bilaterally Abdomen: soft, non-tender; bowel sounds normal; no masses,  no organomegaly Extremities: extremities normal, atraumatic, no cyanosis or edema and left arm with continued edema-improved Wound: clean and dry  Lab Results: Recent Labs    01/07/20 0419 01/08/20 0412  WBC 7.9 8.0  HGB 7.1* 7.2*  HCT 23.3* 23.6*  PLT 99* 109*   BMET:  Recent Labs    01/08/20 0405 01/09/20 0443  NA 135 136  K 4.1 5.1  CL 97* 97*  CO2 27 22  GLUCOSE 145* 109*  BUN 36* 65*  CREATININE 2.47* 3.86*  CALCIUM 7.6* 7.8*    PT/INR:  Recent Labs    01/06/20 1218  LABPROT 16.6*  INR 1.4*   ABG    Component Value Date/Time   PHART 7.403 12/22/2019 0348   HCO3 22.8 12/22/2019 0348   TCO2 24 12/22/2019 0348   ACIDBASEDEF 2.0 12/22/2019 0348   O2SAT 99.0 12/22/2019 0348   CBG (last 3)  Recent Labs    01/08/20 2335 01/09/20 0340 01/09/20 0747  GLUCAP 161* 119* 79    Assessment/Plan: S/P Procedure(s)  (LRB): Video Assisted Thoracoscopy (Vats) left , Drainage of Hemothorax (Left) Video Bronchoscopy (N/A)  1. S/P trach placement. CXR without pneumothorax. Small left pleural effusion. 2. Keep remaining chest tube in for now until transfer to [place]. Drainage remains blood tinged and dark.  3. Renal-on HD. Nephrology following 4. IR-Image guided placement of a tunneled hemodialysis catheter planned for today  Plan: Continue supportive care. Plan for LTAC once dialysis catheter and PEG tube is placed.    LOS: 26 days    Krystal Little 01/09/2020  Patient examined and CXR done 6-24 reviewed Agree to leave  chest tube in place  May remove every other skin staple from L VATS incision Krystal Poot MD

## 2020-01-09 NOTE — Progress Notes (Signed)
Called Barrett Shell [phone]) at [facility] to clarify why this patient was refused.  Awaiting call back.  Erskine Emery MD PCCM

## 2020-01-09 NOTE — Progress Notes (Signed)
Husband consents to trach, will see if IR can do both tunneled catheter and PEG next week in combined procedure.  Erskine Emery MD PCCM

## 2020-01-09 NOTE — Progress Notes (Signed)
RT called to pts room for pt having frequent episodes of tachypnea. Pts RR upon arrival in room was 65 and pt appeared to be tired. RT placed pt back on vent at this time for rest period from weaning. Pts RR now in low to mid 20's on vent, respiratory status is stable at this time. RT will continue to monitor.

## 2020-01-09 NOTE — Progress Notes (Signed)
Pt had dark stain on bedpad appearing to be urine. Will continue to monitor.

## 2020-01-09 NOTE — Consult Note (Signed)
WOC Nurse Consult Note: Patient receiving care in [facility].  Assisted with turning and positioning by primary RN. Reason for Consult: sacral MASD versus stage 2 PI Wound type: MASD, flexiseal leaks intermittently Pressure Injury POA: Yes/No/NA Measurement: Wound bed: pink, small, superficial Drainage (amount, consistency, odor)  Periwound: peeling Dressing procedure/placement/frequency: TID Gerhardt's butt cream and foam dressing. Monitor the wound area(s) for worsening of condition such as: Signs/symptoms of infection,  Increase in size,  Development of or worsening of odor, Development of pain, or increased pain at the affected locations.  Notify the medical team if any of these develop.  Thank you for the consult.  Discussed plan of care with the bedside nurse.  [facility] nurse will not follow at this time.  Please re-consult the [facility] team if needed.  Val Riles, RN, MSN, CWOCN, CNS-BC, pager [phone]

## 2020-01-10 LAB — BASIC METABOLIC PANEL
Anion gap: 13 (ref 5–15)
BUN: 39 mg/dL — ABNORMAL HIGH (ref 8–23)
CO2: 27 mmol/L (ref 22–32)
Calcium: 7.6 mg/dL — ABNORMAL LOW (ref 8.9–10.3)
Chloride: 95 mmol/L — ABNORMAL LOW (ref 98–111)
Creatinine, Ser: 2.47 mg/dL — ABNORMAL HIGH (ref 0.44–1.00)
GFR calc Af Amer: 23 mL/min — ABNORMAL LOW (ref >60)
GFR calc non Af Amer: 20 mL/min — ABNORMAL LOW (ref >60)
Glucose, Bld: 162 mg/dL — ABNORMAL HIGH (ref 70–99)
Potassium: 4.2 mmol/L (ref 3.5–5.1)
Sodium: 135 mmol/L (ref 135–145)

## 2020-01-10 LAB — PHOSPHORUS: Phosphorus: 4.7 mg/dL — ABNORMAL HIGH (ref 2.5–4.6)

## 2020-01-10 LAB — GLUCOSE, CAPILLARY
Glucose-Capillary: 147 mg/dL — ABNORMAL HIGH (ref 70–99)
Glucose-Capillary: 149 mg/dL — ABNORMAL HIGH (ref 70–99)
Glucose-Capillary: 176 mg/dL — ABNORMAL HIGH (ref 70–99)
Glucose-Capillary: 165 mg/dL — ABNORMAL HIGH (ref 70–99)
Glucose-Capillary: 141 mg/dL — ABNORMAL HIGH (ref 70–99)
Glucose-Capillary: 135 mg/dL — ABNORMAL HIGH (ref 70–99)

## 2020-01-10 LAB — MAGNESIUM: Magnesium: 2.4 mg/dL (ref 1.7–2.4)

## 2020-01-10 NOTE — Progress Notes (Signed)
NAME:  Krystal Little, MRN:  [id number], DOB:  December 17, 1955, LOS: 2 ADMISSION DATE:  12/02/2019, CONSULTATION DATE:  5/29 REFERRING MD:  Letta Pate, CHIEF COMPLAINT:  Chest congestion   Brief History   70 female with an extensive cardiac history was admitted to [facility] ([place]) in April in the setting of a right MCA stroke, had a right interventional radiology guided thrombectomy, later had significant brain edema and underwent craniectomy, ultimately transferred to inpatient rehab.  Pulmonary and critical care medicine was consulted on May 29 in the setting of tachypnea and an enlarging left-sided pleural effusion. Thoracentesis was performed which showed a hemothorax.  A CT chest was then ordered showing obstruction of her left mainstem bronchus and a loculated left pleural effusion.  On May 30 she was moved to the ICU for chest tube placement and bronchoscopy.  Underwent intrapleural TPA but required VATS eventually, course further complicated by ICH , seizures and AKI requiring CRRT  Past Medical History  Status post mitral valve repair 2001 Status post cardiac pacemaker 2001 History of sick sinus syndrome Hypertension Diabetes mellitus type 2 Chronic atrial fibrillation Right MCA stroke 2021  Significant Hospital Events   4/16 IR thrombectomy R MCA stroke 4/25 PCCM sign off 5/6 transfer to [facility] 5/29 PCCM consult left pleural effusion, thoracentesis 5/30 worsening tachypnea. Move to ICU for intubation to facilitate bronchoscopy and chest tube placement 5/31 TPA/ pulmonzyme started for 3 days for persistent left effusion 6/01 Afebrile, low-dose Levophed, Precedex, Minimal output on chest tube- pulmozyme / tpa x 2,  6/02 Weaned x 7 hours yesterday PSV 15/5, pulmozyme and TPA completed 3 days 6/03 afib with RVR placed on amio gtt overnight , 1U PRBC for Hb 7.1  6/05 extubated successfully, Repeat TPA/Pulmonzyme given again  6/06 acute decompensation overnight,  hemorrhagic shock > to OR for left VATS with 2L hemothorax drained.  CT head with new ICH> neurosurgery consulted 6/09 More awake but agitated on PSV weaning  6/12: Seizures + on EEG   Consults:  PCCM  Procedures:  5/29 Thoracentesis >> 1.5 L bloody fluid 5/30 ETT >  5/30 L pigtail chest tube >  Bronchoscopy 5/30 >>  large, thick, grey mucus plug completely occluding the left mainstem bronchus.  [facility] HD cath 6/12 >  Significant Diagnostic Tests:  Thoracentesis left chest 5/29 >> 8K WBC, 78% WBC, protein 4.0, LDH 182 CT chest abdomen pelvis 6/6 >> large left hemithorax, patchy right lung opacities with small right effusion.  No acute abdominal process CT head 6/6 >> evolution of right MCA infarct with hemorrhagic transformation with large intraparenchymal hematoma, 2 additional small hemorrhages in the inferior cerebellum and left temporal lobe.  Trace subarachnoid blood. CT Head 6/10 >> stable hemorrhagic R MCA.  Right hemi-craniectomy with no midline shift or mass effect.  Mildly regressed small cerebellar hemorrhages.  Stable small volume intraventricular hemorrhage, no ventriculomegaly.  Trace subarachnoid hemorrhage largely resolved.  6/12 EEG: right frontocentral seizures X2   Micro Data:   RVP 4/16 neg  MRSA PCR  4/17 neg   ======== 5/29 Left pleural fluid culture > negative 5/29 left pleural fluid cytology >> No malignant cells identified. Blood and acute inflammation.  5/30 BAL >> rare candida albicans 6/06 L Pleural Fluid >> negative 6/06 BCx2 >> negative   Antimicrobials:  Cefepime 4/24 >> 4/25 Zosyn 4/28 >> 5/4 Zosyn 5/30 >> 6/5 Cefepime 6/6 >> 6/12  Interim history/subjective:  No events. More awake this AM. Appears comfortable.  Objective  Blood pressure (!) 125/52, pulse 61, temperature 98.5 F (36.9 C), temperature source Axillary, resp. rate (!) 23, height _0  (1.626 m), weight 60 kg, SpO2 100 %.    Vent Mode: PRVC FiO2 (%):  [30 %] 30 % Set Rate:   [20 bmp] 20 bmp Vt Set:  [430 mL] 430 mL PEEP:  [5 cmH20] 5 cmH20 Plateau Pressure:  [17 cmH20-21 cmH20] 21 cmH20   Intake/Output Summary (Last 24 hours) at 01/10/2020 7829 Last data filed at 01/10/2020 0700 Gross per 24 hour  Intake 1271 ml  Output 1400 ml  Net -129 ml   Filed Weights   01/08/20 0414 01/09/20 0630 01/10/20 0500  Weight: 62 kg 63.1 kg 60 kg    Examination:  GEN: NAD HEENT:trach in place, minimal secretions CV: RRR, ext warm PULM: clear, no accessory muscle use GI: Soft, +BS, rectal tube in place EXT: Minimal anasarca NEURO: Moves R side to command today, L hemiparesis PSYCH: RASS 0 SKIN: No rashes  L chest tube in place with serosanguinous output Labs reviewed  Assessment & Plan:   Acute Respiratory Failure with Hypoxemia- trach 6/22 Mucus Plugging, Suspected Aspiration   Left loculated effusion complicated by hemothorax post VATS decortications - TC trials daily - VAP prevention bundle - Trach sutures out 6/27 - Zero output multiple days from chest tube, okay to take out  AKI secondary to shock - no evidence of obstruction on renal US. 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   -No neurosurgical intervention -Supportive care -PEG next week concurrent with tunneled HD catheter  Status Epilepticus, likely related to underlying stroke history :EEG off 6/14 -Keppra and Dilantin per neuro, as needed Ativan for seizure-like activity  DM2 with Hyperglycemia -CBG, SSI plus Lantus 5 q 12, retime to tomorrow given TF on hold  Chronic Atrial Fibrillation s/p Pacemaker -Telemetry -Full dose aspirin and subQ heparin will have to do for foreseeable future - Oral amiodarone  Moderate Protein Calorie Malnutrition  -Continue tube feeding once tunneled line in  Goals of Care -Full code,  should be stable for LTACH vs. SNF depending on if we can wean from vent  Best practice:  Diet: TF  Pain/Anxiety/Delirium protocol (if indicated): fent prn VAP protocol (if indicated): yes DVT prophylaxis: Heparin subQ GI prophylaxis: PPI Glucose control: see above Mobility: BR Code Status: full code Family Communication: updated 6/25 Disposition:  Awaiting LTACH bed, some issues on family preference  The patient is critically ill with multiple organ systems failure and requires high complexity decision making for assessment and support, frequent evaluation and titration of therapies, application of advanced monitoring technologies and extensive interpretation of multiple databases. Critical Care Time devoted to patient care services described in this note independent of APP/resident time (if applicable)  is 38 minutes.   Erskine Emery MD [facility] 01/10/2020 7:52 AM Personal pager: [phone] If unanswered, please page CCM On-call: [phone]

## 2020-01-10 NOTE — Progress Notes (Addendum)
[facility] Progress Note    Assessment/ Plan:    Acute kidney injury, oliguric ischemic ATN secondary to hemorrhagic shock requiring pressor support.  Refractory to IV diuretics initiated on CRRT 12/26/2019- 6/20.  First IHD 6/21. IHD  6/23, 6/25, no signs of renal recovery and not much output with Lasix challenge 6/24.  Will plan for MWF sched.  Please note that she is a very poor long term candidate for hemodialysis; plan is for full scope of care.  TDC placement needed, IR has seen, appreciate assistance.  Will need to clarify with them when it will be placed (if IR placing PEG then maybe both can be done)   L sided hemothorax: s/p VATS with thoracic surgery 6/6.  S/p removal of one chest tube 6/21.     R MCA stroke: s/p thrombectomy 1/61 complicated by worsening cerebral edema with mass effect s/p decompressive right hemicraniectomy and placement of bone flap on 11/02/19.  Had new ICH 6/6.    Acute hypoxic RF: per PCCM.  S/p trach 6/22, weaning as tolerated    Chronic atrial fibrillation: on amiodarone and full ASA   Seizures: continues Dilantin and Keppra per neuro   Nutrition: will need PEG   Dispo: pending.  [facility] has denied pt and family does not want to go to [facility].  If full scope care is the plan and she remains dialysis dependent with a trach, I forsee an out of state facility being her only option.  I greatly appreciate palliative care.  Subjective:    HD yesterday, didn't get [facility] yesterday.  CT scan done yesterday to eval for PEG   Objective:   BP (!) 125/52   Pulse 61   Temp (!) 96.9 F (36.1 C) (Axillary)   Resp (!) 23   Ht '5\' 4"'  (1.626 m)   Wt 60 kg   SpO2 100%   BMI 22.71 kg/m   Intake/Output Summary (Last 24 hours) at 01/10/2020 1013 Last data filed at 01/10/2020 0700 Gross per 24 hour  Intake 1271 ml  Output 1400 ml  Net -129 ml   Weight change: -3.1 kg  Physical Exam: Gen: sitting in bed, NAD HEENT: R hemicraniectomy, trach  in place, c/d/i CVS: RRR Resp: clear bilaterally anteriorly Abd: soft Ext: no LE edema ACCESS: L IJ nontunneled HD cath  Imaging: CT ABDOMEN WO CONTRAST  Result Date: 01/09/2020 CLINICAL DATA:  64 year old with dysphagia and evaluate anatomy for gastrostomy tube placement. History of left VATS procedure. EXAM: CT ABDOMEN WITHOUT CONTRAST TECHNIQUE: Multidetector CT imaging of the abdomen was performed following the standard protocol without IV contrast. COMPARISON:  12/24/2019 FINDINGS: Lower chest: Partially visualized large bore left chest tube. Complex gas-filled collection in the left pleural space. Difficult to exclude a small amount of residual left pleural fluid. Patchy parenchymal densities at the right lower lobe with compressive atelectasis in both lower lobes. Cannot exclude mild infectious etiology at the right lung base. Small to moderate sized right pleural effusion. Small amount of consolidation or focal pleural fluid along the base of the lingula with an adjacent rib fracture. History of median sternotomy. Pacer wire extends into the right ventricle. Hepatobiliary: Again noted is a slightly exophytic structure in the inferior right hepatic lobe that is low density and measures roughly 1.6 cm. This likely represents a hepatic cyst. Normal appearance of the gallbladder. Liver appears to have a nodular contour and difficult to exclude cirrhosis. Pancreas: Unremarkable. No pancreatic ductal dilatation or surrounding inflammatory changes. Spleen: Again  noted is a large calcified structure involving the superomedial aspect of the spleen. Adrenals/Urinary Tract: Adrenal tissue is unremarkable. No hydronephrosis. Stomach/Bowel: Nasogastric feeding tube extends in the stomach and the tip terminates near the proximal duodenum. Transverse colon is caudal to the stomach. Evidence for a percutaneous window for gastrostomy tube placement. No evidence for a bowel obstruction. No significant stomach  distention. Vascular/Lymphatic: Multiple small lymph nodes in the periaortic region. Normal caliber of the abdominal aorta. Other: Negative for ascites. Skull flap is implanted within the subcutaneous tissues of the right anterior abdomen. Small periumbilical hernia containing fat. Musculoskeletal: Mildly displaced rib fracture in the left lateral chest at the level of the lingula. This is likely related to the previous VATS procedure. IMPRESSION: 1. Anatomy is amendable for a percutaneous gastrostomy tube placement. 2. Postsurgical changes from left VATS procedure. Residual loculated gas collection in the left pleural space. Cannot exclude a small amount of residual left pleural fluid. 3. Small to moderate amount of right pleural fluid. Probable small amount of pleural fluid adjacent to the lingula. Compressive atelectasis at the lung bases. 4. Subtle patchy densities at the right lung base are nonspecific and cannot exclude a mild infectious etiology in this area. 5. Liver has a slightly nodular contour and cannot exclude underlying cirrhosis. Electronically Signed   By: Markus Daft M.D.   On: 01/09/2020 17:27    Labs: BMET Recent Labs  Lab 01/04/20 0427 01/04/20 0427 01/05/20 0412 01/05/20 1832 01/06/20 0824 01/07/20 0419 01/08/20 0405 01/09/20 0443 01/10/20 0340  NA 132*   < > 133* 135 134* 133* 135 136 135  K 4.5   < > 5.8* 3.2* 3.6 4.2 4.1 5.1 4.2  CL 99   < > 100 99 96* 95* 97* 97* 95*  CO2 26   < > '22 27 27 24 27 22 27  ' GLUCOSE 125*   < > 187* 138* 154* 154* 145* 109* 162*  BUN 21   < > 49* 17 33* 57* 36* 65* 39*  CREATININE 0.94   < > 2.23* 1.16* 2.23* 3.24* 2.47* 3.86* 2.47*  CALCIUM 7.4*   < > 7.2* 7.4* 7.4* 7.5* 7.6* 7.8* 7.6*  PHOS 2.0*  --  2.3* 1.7*  --  4.9* 4.0 6.7* 4.7*   < > = values in this interval not displayed.   CBC Recent Labs  Lab 01/05/20 1832 01/06/20 0437 01/07/20 0419 01/08/20 0412  WBC 10.2 8.9 7.9 8.0  HGB 7.5* 7.1* 7.1* 7.2*  HCT 24.2* 23.1* 23.3*  23.6*  MCV 97.2 97.9 100.0 100.0  PLT 85* 93* 99* 109*    Medications:    . amantadine  100 mg Per Tube BID  . amiodarone  200 mg Per Tube Daily  . aspirin  325 mg Per Tube Daily  . atorvastatin  40 mg Per Tube Daily  . chlorhexidine gluconate (MEDLINE KIT)  15 mL Mouth Rinse BID  . Chlorhexidine Gluconate Cloth  6 each Topical Q0600  . darbepoetin (ARANESP) injection - DIALYSIS  100 mcg Intravenous Q Fri-HD  . docusate  100 mg Per Tube BID  . feeding supplement (PRO-STAT SUGAR FREE 64)  30 mL Per Tube Daily  . Gerhardt's butt cream   Topical TID  . heparin injection (subcutaneous)  5,000 Units Subcutaneous Q8H  . insulin aspart  0-20 Units Subcutaneous Q4H  . insulin glargine  5 Units Subcutaneous BID  . mouth rinse  15 mL Mouth Rinse 10 times per day  . multivitamin  1 tablet Oral QHS  . pantoprazole sodium  40 mg Per Tube QHS  . phenytoin (DILANTIN) IV  100 mg Intravenous Q8H  . polyethylene glycol  17 g Per Tube TID  . propofol  500 mg Intravenous Once  . senna-docusate  1 tablet Per Tube BID  . sodium chloride flush  10-40 mL Intracatheter Q12H      Madelon Lips, MD 01/10/2020, 10:13 AM

## 2020-01-10 NOTE — Plan of Care (Signed)

## 2020-01-11 LAB — BASIC METABOLIC PANEL
Anion gap: 13 (ref 5–15)
BUN: 65 mg/dL — ABNORMAL HIGH (ref 8–23)
CO2: 26 mmol/L (ref 22–32)
Calcium: 7.7 mg/dL — ABNORMAL LOW (ref 8.9–10.3)
Chloride: 95 mmol/L — ABNORMAL LOW (ref 98–111)
Creatinine, Ser: 3.71 mg/dL — ABNORMAL HIGH (ref 0.44–1.00)
GFR calc Af Amer: 14 mL/min — ABNORMAL LOW (ref >60)
GFR calc non Af Amer: 12 mL/min — ABNORMAL LOW (ref >60)
Glucose, Bld: 141 mg/dL — ABNORMAL HIGH (ref 70–99)
Potassium: 4.4 mmol/L (ref 3.5–5.1)
Sodium: 134 mmol/L — ABNORMAL LOW (ref 135–145)

## 2020-01-11 LAB — CBC
HCT: 23.8 % — ABNORMAL LOW (ref 36.0–46.0)
Hemoglobin: 7.2 g/dL — ABNORMAL LOW (ref 12.0–15.0)
MCH: 30.4 pg (ref 26.0–34.0)
MCHC: 30.3 g/dL (ref 30.0–36.0)
MCV: 100.4 fL — ABNORMAL HIGH (ref 80.0–100.0)
Platelets: 198 10*3/uL (ref 150–400)
RBC: 2.37 MIL/uL — ABNORMAL LOW (ref 3.87–5.11)
RDW: 22.2 % — ABNORMAL HIGH (ref 11.5–15.5)
WBC: 10.1 10*3/uL (ref 4.0–10.5)
nRBC: 0 % (ref 0.0–0.2)

## 2020-01-11 LAB — GLUCOSE, CAPILLARY
Glucose-Capillary: 131 mg/dL — ABNORMAL HIGH (ref 70–99)
Glucose-Capillary: 125 mg/dL — ABNORMAL HIGH (ref 70–99)
Glucose-Capillary: 169 mg/dL — ABNORMAL HIGH (ref 70–99)
Glucose-Capillary: 140 mg/dL — ABNORMAL HIGH (ref 70–99)
Glucose-Capillary: 136 mg/dL — ABNORMAL HIGH (ref 70–99)

## 2020-01-11 LAB — ALBUMIN: Albumin: 1.5 g/dL — ABNORMAL LOW (ref 3.5–5.0)

## 2020-01-11 LAB — MAGNESIUM: Magnesium: 2.6 mg/dL — ABNORMAL HIGH (ref 1.7–2.4)

## 2020-01-11 LAB — PHENYTOIN LEVEL, TOTAL: Phenytoin Lvl: 4.7 ug/mL — ABNORMAL LOW (ref 10.0–20.0)

## 2020-01-11 LAB — PHOSPHORUS: Phosphorus: 4.8 mg/dL — ABNORMAL HIGH (ref 2.5–4.6)

## 2020-01-11 NOTE — Progress Notes (Signed)
[facility] Progress Note    Assessment/ Plan:    Acute kidney injury, oliguric ischemic ATN secondary to hemorrhagic shock requiring pressor support.  Refractory to IV diuretics initiated on CRRT 12/26/2019- 6/20.  First IHD 6/21. IHD  6/23, 6/25, no signs of renal recovery and not much output with Lasix challenge 6/24.  Will plan for MWF sched.  Please note that she is a very poor long term candidate for hemodialysis; plan is for full scope of care.  TDC placement needed, IR has seen, appreciate assistance.  Will need to clarify with them when it will be placed (if IR placing PEG then maybe both can be done).  Next HD session 6/28.     L sided hemothorax: s/p VATS with thoracic surgery 6/6.  S/p removal of one chest tube 6/21.     R MCA stroke: s/p thrombectomy 1/30 complicated by worsening cerebral edema with mass effect s/p decompressive right hemicraniectomy and placement of bone flap on 11/02/19.  Had new ICH 6/6.    Acute hypoxic RF: per PCCM.  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  [facility] has denied pt and family does not want to go to [facility].  If full scope care is the plan and she remains dialysis dependent with a trach, I forsee an out of state facility being her only option.  I greatly appreciate palliative care.  Subjective:    No changes.  Sleeping when I walked in, somewhat arousable   Objective:   BP 140/61 (BP Location: Right Leg)   Pulse 60   Temp 98.5 F (36.9 C) (Oral)   Resp (!) 25   Ht '5\' 4"'  (1.626 m)   Wt 62.1 kg   SpO2 100%   BMI 23.50 kg/m   Intake/Output Summary (Last 24 hours) at 01/11/2020 8657 Last data filed at 01/11/2020 0800 Gross per 24 hour  Intake 1548.14 ml  Output 200 ml  Net  1348.14 ml   Weight change: 2.1 kg  Physical Exam: Gen: sitting in bed, NAD HEENT: R hemicraniectomy, trach in place, c/d/i CVS: RRR Resp: clear bilaterally anteriorly Abd: soft Ext: no LE edema ACCESS: L IJ nontunneled HD cath  Imaging: CT ABDOMEN WO CONTRAST  Result Date: 01/09/2020 CLINICAL DATA:  64 year old with dysphagia and evaluate anatomy for gastrostomy tube placement. History of left VATS procedure. EXAM: CT ABDOMEN WITHOUT CONTRAST TECHNIQUE: Multidetector CT imaging of the abdomen was performed following the standard protocol without IV contrast. COMPARISON:  01/09/2020 FINDINGS: Lower chest: Partially visualized large bore left chest tube. Complex gas-filled collection in the left pleural space. Difficult to exclude a small amount of residual left pleural fluid. Patchy parenchymal densities at the right lower lobe with compressive atelectasis in both lower lobes. Cannot exclude mild infectious etiology at the right lung base. Small to moderate sized right pleural effusion. Small amount of consolidation or focal pleural fluid along the base of the lingula with an adjacent rib fracture. History of median sternotomy. Pacer wire extends into the right ventricle. Hepatobiliary: Again noted is a slightly exophytic structure in the inferior right hepatic lobe that is low density and measures roughly 1.6 cm. This likely represents a hepatic cyst. Normal appearance of the gallbladder. Liver appears  to have a nodular contour and difficult to exclude cirrhosis. Pancreas: Unremarkable. No pancreatic ductal dilatation or surrounding inflammatory changes. Spleen: Again noted is a large calcified structure involving the superomedial aspect of the spleen. Adrenals/Urinary Tract: Adrenal tissue is unremarkable. No hydronephrosis. Stomach/Bowel: Nasogastric feeding tube extends in the stomach and the tip terminates near the proximal duodenum. Transverse colon is caudal to the stomach. Evidence for a  percutaneous window for gastrostomy tube placement. No evidence for a bowel obstruction. No significant stomach distention. Vascular/Lymphatic: Multiple small lymph nodes in the periaortic region. Normal caliber of the abdominal aorta. Other: Negative for ascites. Skull flap is implanted within the subcutaneous tissues of the right anterior abdomen. Small periumbilical hernia containing fat. Musculoskeletal: Mildly displaced rib fracture in the left lateral chest at the level of the lingula. This is likely related to the previous VATS procedure. IMPRESSION: 1. Anatomy is amendable for a percutaneous gastrostomy tube placement. 2. Postsurgical changes from left VATS procedure. Residual loculated gas collection in the left pleural space. Cannot exclude a small amount of residual left pleural fluid. 3. Small to moderate amount of right pleural fluid. Probable small amount of pleural fluid adjacent to the lingula. Compressive atelectasis at the lung bases. 4. Subtle patchy densities at the right lung base are nonspecific and cannot exclude a mild infectious etiology in this area. 5. Liver has a slightly nodular contour and cannot exclude underlying cirrhosis. Electronically Signed   By: Markus Daft M.D.   On: 01/09/2020 17:27    Labs: BMET Recent Labs  Lab 01/05/20 2761 01/05/20 0412 01/05/20 1832 01/06/20 0824 01/07/20 0419 01/08/20 0405 01/09/20 0443 01/10/20 0340 01/11/20 0330  NA 133*   < > 135 134* 133* 135 136 135 134*  K 5.8*   < > 3.2* 3.6 4.2 4.1 5.1 4.2 4.4  CL 100   < > 99 96* 95* 97* 97* 95* 95*  CO2 22   < > '27 27 24 27 22 27 26  ' GLUCOSE 187*   < > 138* 154* 154* 145* 109* 162* 141*  BUN 49*   < > 17 33* 57* 36* 65* 39* 65*  CREATININE 2.23*   < > 1.16* 2.23* 3.24* 2.47* 3.86* 2.47* 3.71*  CALCIUM 7.2*   < > 7.4* 7.4* 7.5* 7.6* 7.8* 7.6* 7.7*  PHOS 2.3*  --  1.7*  --  4.9* 4.0 6.7* 4.7* 4.8*   < > = values in this interval not displayed.   CBC Recent Labs  Lab 01/06/20 0437  01/07/20 0419 01/08/20 0412 01/11/20 0330  WBC 8.9 7.9 8.0 10.1  HGB 7.1* 7.1* 7.2* 7.2*  HCT 23.1* 23.3* 23.6* 23.8*  MCV 97.9 100.0 100.0 100.4*  PLT 93* 99* 109* 198    Medications:    . amantadine  100 mg Per Tube BID  . amiodarone  200 mg Per Tube Daily  . aspirin  325 mg Per Tube Daily  . atorvastatin  40 mg Per Tube Daily  . chlorhexidine gluconate (MEDLINE KIT)  15 mL Mouth Rinse BID  . Chlorhexidine Gluconate Cloth  6 each Topical Q0600  . darbepoetin (ARANESP) injection - DIALYSIS  100 mcg Intravenous Q Fri-HD  . docusate  100 mg Per Tube BID  . feeding supplement (PRO-STAT SUGAR FREE 64)  30 mL Per Tube Daily  . Gerhardt's butt cream   Topical TID  . heparin injection (subcutaneous)  5,000 Units Subcutaneous Q8H  . insulin aspart  0-20 Units Subcutaneous Q4H  . insulin glargine  5 Units Subcutaneous BID  . mouth rinse  15 mL Mouth Rinse 10 times per day  . multivitamin  1 tablet Oral QHS  . pantoprazole sodium  40 mg Per Tube QHS  . phenytoin (DILANTIN) IV  100 mg Intravenous Q8H  . polyethylene glycol  17 g Per Tube TID  . propofol  500 mg Intravenous Once  . senna-docusate  1 tablet Per Tube BID  . sodium chloride flush  10-40 mL Intracatheter Q12H      Madelon Lips, MD 01/11/2020, 8:26 AM

## 2020-01-11 NOTE — Progress Notes (Signed)
NAME:  Krystal Little, MRN:  [id number], DOB:  Jul 30, 1955, LOS: 50 ADMISSION DATE:  12/15/2019, CONSULTATION DATE:  5/29 REFERRING MD:  Letta Pate, CHIEF COMPLAINT:  Chest congestion   Brief History   63 female with an extensive cardiac history was admitted to [facility] in April in the setting of a right MCA stroke, had a right interventional radiology guided thrombectomy, later had significant brain edema and underwent craniectomy, ultimately transferred to inpatient rehab.  Pulmonary and critical care medicine was consulted on May 29 in the setting of tachypnea and an enlarging left-sided pleural effusion. Thoracentesis was performed which showed a hemothorax.  A CT chest was then ordered showing obstruction of her left mainstem bronchus and a loculated left pleural effusion.  On May 30 she was moved to the ICU for chest tube placement and bronchoscopy.  Underwent intrapleural TPA but required VATS eventually, course further complicated by ICH , seizures and AKI requiring CRRT  Past Medical History  Status post mitral valve repair 2001 Status post cardiac pacemaker 2001 History of sick sinus syndrome Hypertension Diabetes mellitus type 2 Chronic atrial fibrillation Right MCA stroke 2021  Significant Hospital Events   4/16 IR thrombectomy R MCA stroke 4/25 PCCM sign off 5/6 transfer to [facility] 5/29 PCCM consult left pleural effusion, thoracentesis 5/30 worsening tachypnea. Move to ICU for intubation to facilitate bronchoscopy and chest tube placement 5/31 TPA/ pulmonzyme started for 3 days for persistent left effusion 6/01 Afebrile, low-dose Levophed, Precedex, Minimal output on chest tube- pulmozyme / tpa x 2,  6/02 Weaned x 7 hours yesterday PSV 15/5, pulmozyme and TPA completed 3 days 6/03 afib with RVR placed on amio gtt overnight , 1U PRBC for Hb 7.1  6/05 extubated successfully, Repeat TPA/Pulmonzyme given again  6/06 acute decompensation overnight,  hemorrhagic shock > to OR for left VATS with 2L hemothorax drained.  CT head with new ICH> neurosurgery consulted 6/09 More awake but agitated on PSV weaning  6/12: Seizures + on EEG   Consults:  PCCM  Procedures:  5/29 Thoracentesis >> 1.5 L bloody fluid 5/30 ETT >  5/30 L pigtail chest tube >  Bronchoscopy 5/30 >>  large, thick, grey mucus plug completely occluding the left mainstem bronchus.  [facility] HD cath 6/12 >  Significant Diagnostic Tests:  Thoracentesis left chest 5/29 >> 8K WBC, 78% WBC, protein 4.0, LDH 182 CT chest abdomen pelvis 6/6 >> large left hemithorax, patchy right lung opacities with small right effusion.  No acute abdominal process CT head 6/6 >> evolution of right MCA infarct with hemorrhagic transformation with large intraparenchymal hematoma, 2 additional small hemorrhages in the inferior cerebellum and left temporal lobe.  Trace subarachnoid blood. CT Head 6/10 >> stable hemorrhagic R MCA.  Right hemi-craniectomy with no midline shift or mass effect.  Mildly regressed small cerebellar hemorrhages.  Stable small volume intraventricular hemorrhage, no ventriculomegaly.  Trace subarachnoid hemorrhage largely resolved.  6/12 EEG: right frontocentral seizures X2   Micro Data:   RVP 4/16 neg  MRSA PCR  4/17 neg   ======== 5/29 Left pleural fluid culture > negative 5/29 left pleural fluid cytology >> No malignant cells identified. Blood and acute inflammation.  5/30 BAL >> rare candida albicans 6/06 L Pleural Fluid >> negative 6/06 BCx2 >> negative   Antimicrobials:  Cefepime 4/24 >> 4/25 Zosyn 4/28 >> 5/4 Zosyn 5/30 >> 6/5 Cefepime 6/6 >> 6/12  Interim history/subjective:  More awake. Denies pain.  Objective   Blood pressure 137/66,  pulse 61, temperature 98.5 F (36.9 C), temperature source Oral, resp. rate (!) 23, height _0  (1.626 m), weight 62.1 kg, SpO2 100 %.    Vent Mode: PRVC FiO2 (%):  [30 %] 30 % Set Rate:  [20 bmp] 20 bmp Vt Set:  [430 mL]  430 mL PEEP:  [5 cmH20] 5 cmH20 Plateau Pressure:  [18 cmH20-24 cmH20] 24 cmH20   Intake/Output Summary (Last 24 hours) at 01/11/2020 0743 Last data filed at 01/11/2020 0600 Gross per 24 hour  Intake 1488.14 ml  Output 200 ml  Net 1288.14 ml   Filed Weights   01/09/20 0630 01/10/20 0500 01/11/20 0500  Weight: 63.1 kg 60 kg 62.1 kg    Examination:  GEN: NAD HEENT:trach in place, minimal secretions CV: RRR, ext warm PULM: clear, no accessory muscle use GI: Soft, +BS, rectal tube in place EXT: Minimal anasarca NEURO: Moves R side to command, L hemiparesis PSYCH: RASS 0 SKIN: No rashes  Labs reviewed, stable anemia  Assessment & Plan:   Acute Respiratory Failure with Hypoxemia- trach 6/22 Mucus Plugging, Suspected Aspiration   Left loculated effusion complicated by hemothorax post VATS decortications; chest tube out 6/26 as no output for multiple days - TC trials daily - VAP prevention bundle - Trach sutures out today  AKI secondary to shock - no evidence of obstruction on renal US. 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   -No neurosurgical intervention -Supportive care -PEG this week concurrent with tunneled HD catheter  Status Epilepticus, likely related to underlying stroke history :EEG off 6/14 -Keppra and Dilantin per neuro, as needed Ativan for seizure-like activity  DM2 with Hyperglycemia -CBG, SSI plus Lantus 5 q 12  Chronic Atrial Fibrillation s/p Pacemaker -Telemetry -Full dose aspirin and subQ heparin will have to do for foreseeable future - Oral amiodarone  Moderate Protein Calorie Malnutrition  -Continue tube feeding once tunneled line in  Goals of Care -Full code, should be stable for LTACH vs. SNF depending on if we can wean from vent  Best practice:  Diet: TF   Pain/Anxiety/Delirium protocol (if indicated): fent prn VAP protocol (if indicated): yes DVT prophylaxis: Heparin subQ GI prophylaxis: PPI Glucose control: see above Mobility: BR Code Status: full code Family Communication: updated 6/25 Disposition:  Awaiting LTACH bed, some issues on family preference   Erskine Emery MD [facility] 01/11/2020 7:43 AM Personal pager: [phone] If unanswered, please page CCM On-call: [phone]

## 2020-01-12 ENCOUNTER — Inpatient Hospital Stay (HOSPITAL_COMMUNITY): Payer: Medicare Other

## 2020-01-12 LAB — CBC
HCT: 23.1 % — ABNORMAL LOW (ref 36.0–46.0)
Hemoglobin: 6.9 g/dL — CL (ref 12.0–15.0)
MCH: 30 pg (ref 26.0–34.0)
MCHC: 29.9 g/dL — ABNORMAL LOW (ref 30.0–36.0)
MCV: 100.4 fL — ABNORMAL HIGH (ref 80.0–100.0)
Platelets: 210 10*3/uL (ref 150–400)
RBC: 2.3 MIL/uL — ABNORMAL LOW (ref 3.87–5.11)
RDW: 21.7 % — ABNORMAL HIGH (ref 11.5–15.5)
WBC: 9.5 10*3/uL (ref 4.0–10.5)
nRBC: 0 % (ref 0.0–0.2)

## 2020-01-12 LAB — BASIC METABOLIC PANEL
Anion gap: 15 (ref 5–15)
BUN: 89 mg/dL — ABNORMAL HIGH (ref 8–23)
CO2: 24 mmol/L (ref 22–32)
Calcium: 8 mg/dL — ABNORMAL LOW (ref 8.9–10.3)
Chloride: 96 mmol/L — ABNORMAL LOW (ref 98–111)
Creatinine, Ser: 4.43 mg/dL — ABNORMAL HIGH (ref 0.44–1.00)
GFR calc Af Amer: 11 mL/min — ABNORMAL LOW (ref >60)
GFR calc non Af Amer: 10 mL/min — ABNORMAL LOW (ref >60)
Glucose, Bld: 155 mg/dL — ABNORMAL HIGH (ref 70–99)
Potassium: 5 mmol/L (ref 3.5–5.1)
Sodium: 135 mmol/L (ref 135–145)

## 2020-01-12 LAB — GLUCOSE, CAPILLARY
Glucose-Capillary: 105 mg/dL — ABNORMAL HIGH (ref 70–99)
Glucose-Capillary: 149 mg/dL — ABNORMAL HIGH (ref 70–99)
Glucose-Capillary: 150 mg/dL — ABNORMAL HIGH (ref 70–99)
Glucose-Capillary: 151 mg/dL — ABNORMAL HIGH (ref 70–99)
Glucose-Capillary: 177 mg/dL — ABNORMAL HIGH (ref 70–99)
Glucose-Capillary: 190 mg/dL — ABNORMAL HIGH (ref 70–99)

## 2020-01-12 LAB — PREPARE RBC (CROSSMATCH)

## 2020-01-12 LAB — FOLATE: Folate: 10.4 ng/mL (ref >5.9)

## 2020-01-12 LAB — VITAMIN B12: Vitamin B-12: 2230 pg/mL — ABNORMAL HIGH (ref 180–914)

## 2020-01-12 LAB — PHOSPHORUS: Phosphorus: 5.5 mg/dL — ABNORMAL HIGH (ref 2.5–4.6)

## 2020-01-12 LAB — MAGNESIUM: Magnesium: 2.8 mg/dL — ABNORMAL HIGH (ref 1.7–2.4)

## 2020-01-12 MED ORDER — CEFAZOLIN SODIUM-DEXTROSE 2-4 GM/100ML-% IV SOLN
2.0000 g | INTRAVENOUS | Status: AC
  Administered 2020-01-13: 2 g via INTRAVENOUS
  Filled 2020-01-12: qty 100

## 2020-01-12 MED ORDER — CEFAZOLIN SODIUM-DEXTROSE 2-4 GM/100ML-% IV SOLN
2.0000 g | INTRAVENOUS | Status: DC
  Filled 2020-01-12: qty 100

## 2020-01-12 MED ORDER — SODIUM CHLORIDE 0.9% IV SOLUTION: Freq: Once | INTRAVENOUS | Status: AC

## 2020-01-12 NOTE — TOC Transition Note (Signed)
Transition of Care [facility]) - CM/SW Discharge Note   Patient Details  Name: Krystal Little MRN: [id number] Date of Birth: 1956-04-29  [facility] CM/SW Contact:  Curlene Labrum, RN Phone Number: 01/12/2020, 4:07 PM   Clinical Narrative:    Case management spoke with Kathee Delton and Burgess Estelle, RNCM at [facility] and they are going to reach out to the family about possibly giving a tour to the patient's husband and daughter for [facility].  The husband seemed more willing today to explore the [facility] facility.  When I spoke with Kathee Delton, RNCM this afternoon, they did not have any available LTAC beds available for the moment but they will reach out to the family for a tour of the facility and keep a bed in mind for the patient if the family is willing.  Will continue to follow for transitions of care.   Final next level of care: Long Term Acute Care (LTAC) Barriers to Discharge: Continued Medical Work up (Patient received trach yesterday and is starting trach trials today.  No PEG at this time.)   Patient Goals and CMS Choice Patient states their goals for this hospitalization and ongoing recovery are:: Patient's husband is open to patient's transfer to [facility] CMS Medicare.gov Compare Post Acute Care list provided to:: Patient Represenative (must comment) (husband) Choice offered to / list presented to : Spouse  Discharge Placement                       Discharge Plan and Services   Discharge Planning Services: CM Consult Post Acute Care Choice: Long Term Acute Care (LTAC) (Prefers 1. [facility] and 2. [facility])                               Social Determinants of Health (SDOH) Interventions     Readmission Risk Interventions Readmission Risk Prevention Plan 01/07/2020  Transportation Screening Complete  Medication Review Press photographer) Complete  PCP or Specialist appointment within 3-5 days of discharge Complete  HRI or  Home Care Consult Complete  SW Recovery Care/Counseling Consult Complete  Palliative Care Screening Complete  Skilled Nursing Facility Complete  Some recent data might be hidden

## 2020-01-12 NOTE — Progress Notes (Signed)
SLP Cancellation Note  Patient Details Name: Krystal Little MRN: [id number] DOB: 1955/11/02   Cancelled treatment:       Reason Eval/Treat Not Completed: Patient at procedure or test/unavailable;Patient not medically ready.  Spoke with RN who stated that pt was currently on the vent and that she was in dialysis at this time.  RN stated that they plan to try and wean her to trach collar later today/tomorrow.  SLP will f/u as pt is appropriate.     Elvia Collum Trace Wirick 01/12/2020, 9:03 AM

## 2020-01-12 NOTE — Progress Notes (Signed)
[facility] Progress Note    Assessment/ Plan:    Acute kidney injury, oliguric ischemic ATN secondary to hemorrhagic shock requiring pressor support.  Refractory to IV diuretics initiated on CRRT 12/26/2019- 6/20.  First IHD 6/21. IHD  6/23, 6/25, no signs of renal recovery and not much output with Lasix challenge 6/24.    - HD per MWF schedule   - Please note that she is a very poor long term candidate for hemodialysis  - Note tunneled dialysis catheter placement needed, IR has seen, appreciate assistance.  Will need to clarify with them when it will be placed (if IR placing PEG then maybe both can be done)  - Strict ins/outs   L sided hemothorax: s/p VATS with thoracic surgery 6/6.  S/p removal of one chest tube 6/21.     R MCA stroke: s/p thrombectomy 8/24 complicated by worsening cerebral edema with mass effect s/p decompressive right hemicraniectomy and placement of bone flap on 11/02/19.  Had new ICH 6/6.    Acute hypoxic RF: per PCCM.  S/p trach 6/22, wean as tolerated    Chronic atrial fibrillation: on amiodarone and asa    Seizures: anti-epileptics per neuro    Nutrition: will need PEG   Anemia- s/p multiple pRBCs - continue to transfuse as needed.  On Aranesp 100 mcg  every Friday   BMD: phos OK on no binders    Dispo: pending.  [facility] has denied pt and family does not want to go to [facility].  If full scope care is the plan and she remains dialysis dependent with a trach, I forsee an out of state facility being her only option.  I greatly appreciate palliative care.  Subjective:    Strict ins/outs not available - 2 unmeasured voids charted over 6/27.  Last HD early on 6/25- with 1 kg UF charted.  She is not able to offer additional hx.   Review of systems: unable to obtain secondary to trach/pt status    Objective:   BP (!) 142/63   Pulse 60   Temp 98.7 F (37.1 C) (Oral)   Resp (!) 24   Ht '5\' 4"'  (1.626 m)   Wt 62.7 kg   SpO2 100%   BMI  23.73 kg/m   Intake/Output Summary (Last 24 hours) at 01/12/2020 0546 Last data filed at 01/12/2020 0500 Gross per 24 hour  Intake 1688 ml  Output 500 ml  Net 1188 ml   Weight change: 0.6 kg  Physical Exam:   Gen: lying in bed does not wake with exam  HEENT: R hemicraniectomy, trach in place CVS: RRR Resp: clear bilaterally anteriorly Abd: soft non distended Ext: no LE edema ACCESS: L IJ nontunneled HD cath  Imaging: No results found.  Labs: BMET Recent Labs  Lab 01/05/20 1832 01/05/20 1832 01/06/20 2353 01/07/20 0419 01/08/20 0405 01/09/20 0443 01/10/20 0340 01/11/20 0330 01/12/20 0322  NA 135   < > 134* 133* 135 136 135 134* 135  K 3.2*   < > 3.6 4.2 4.1 5.1 4.2 4.4 5.0  CL 99   < > 96* 95* 97* 97* 95* 95* 96*  CO2 27   < > '27 24 27 22 27 26 24  ' GLUCOSE 138*   < > 154* 154* 145* 109* 162* 141* 155*  BUN 17   < > 33* 57* 36* 65* 39* 65* 89*  CREATININE 1.16*   < > 2.23* 3.24* 2.47* 3.86* 2.47* 3.71* 4.43*  CALCIUM 7.4*   < >  7.4* 7.5* 7.6* 7.8* 7.6* 7.7* 8.0*  PHOS 1.7*  --   --  4.9* 4.0 6.7* 4.7* 4.8* 5.5*   < > = values in this interval not displayed.   CBC Recent Labs  Lab 01/06/20 0437 01/07/20 0419 01/08/20 0412 01/11/20 0330  WBC 8.9 7.9 8.0 10.1  HGB 7.1* 7.1* 7.2* 7.2*  HCT 23.1* 23.3* 23.6* 23.8*  MCV 97.9 100.0 100.0 100.4*  PLT 93* 99* 109* 198    Medications:    . amantadine  100 mg Per Tube BID  . amiodarone  200 mg Per Tube Daily  . aspirin  325 mg Per Tube Daily  . atorvastatin  40 mg Per Tube Daily  . chlorhexidine gluconate (MEDLINE KIT)  15 mL Mouth Rinse BID  . Chlorhexidine Gluconate Cloth  6 each Topical Q0600  . darbepoetin (ARANESP) injection - DIALYSIS  100 mcg Intravenous Q Fri-HD  . docusate  100 mg Per Tube BID  . feeding supplement (PRO-STAT SUGAR FREE 64)  30 mL Per Tube Daily  . Gerhardt's butt cream   Topical TID  . heparin injection (subcutaneous)  5,000 Units Subcutaneous Q8H  . insulin aspart  0-20 Units  Subcutaneous Q4H  . insulin glargine  5 Units Subcutaneous BID  . mouth rinse  15 mL Mouth Rinse 10 times per day  . multivitamin  1 tablet Oral QHS  . pantoprazole sodium  40 mg Per Tube QHS  . phenytoin (DILANTIN) IV  100 mg Intravenous Q8H  . polyethylene glycol  17 g Per Tube TID  . propofol  500 mg Intravenous Once  . senna-docusate  1 tablet Per Tube BID  . sodium chloride flush  10-40 mL Intracatheter Q12H     Claudia Desanctis, MD 01/12/2020, 6:00 AM

## 2020-01-12 NOTE — TOC Transition Note (Signed)
Transition of Care [facility]) - CM/SW Discharge Note   Patient Details  Name: Krystal Little MRN: [id number] Date of Birth: Dec 04, 1955  [facility]) CM/SW Contact:  Curlene Labrum, RN Phone Number: 01/12/2020, 11:21 AM   Clinical Narrative:    Case management met with the family to discuss if the patient would be willing and able to take a tour of [facility] to consider patient's transfer when a bed is available.  I spoke with both the husband and the daughter at the bedside at they were more open to tour [facility].  I left a message with Arnette Norris, [facility] liaison, to discuss speaking with the family about a tour.  Arnette Norris [phone] and [phone].   Final next level of care: Long Term Acute Care (LTAC) Barriers to Discharge: Continued Medical Work up (Patient received trach yesterday and is starting trach trials today.  No PEG at this time.)   Patient Goals and CMS Choice Patient states their goals for this hospitalization and ongoing recovery are:: Patient's husband is open to patient's transfer to [facility] CMS Medicare.gov Compare Post Acute Care list provided to:: Patient Represenative (must comment) (husband) Choice offered to / list presented to : Spouse  Discharge Placement                       Discharge Plan and Services   Discharge Planning Services: CM Consult Post Acute Care Choice: Long Term Acute Care (LTAC) (Prefers 1. [facility] and 2. [facility])                               Social Determinants of Health (SDOH) Interventions     Readmission Risk Interventions Readmission Risk Prevention Plan 01/07/2020  Transportation Screening Complete  Medication Review Press photographer) Complete  PCP or Specialist appointment within 3-5 days of discharge Complete  HRI or Home Care Consult Complete  SW Recovery Care/Counseling Consult Complete  Palliative Care Screening Complete  Skilled Nursing Facility Complete   Some recent data might be hidden

## 2020-01-12 NOTE — Consult Note (Signed)
Chief Complaint: Patient was seen in consultation today for percutaneous gastric tube placement at the request of Dr Charlsie Quest   Supervising Physician: Corrie Mckusick  Patient Status: [facility] - In-pt  History of Present Illness: Krystal Little is a 64 y.o. 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 Chronic atrial fibrillation ([place]) 1990s   s/p DCCV then attempted ablation of complex A Flutter (@ [facility] - Dr. Deno Etienne), failed antiarrhythmics --> INR folllwed @ [facility] ->> now status post pacemaker placement with underlying A. fib.  . Diabetes mellitus type 2, uncontrolled, without complications    On Oral Medications ([place] [facility])  . Essential hypertension   . History of cardiac catheterization 2001   R&LHC - normal Coronaries, no evidence of Restictive Cardiomyopathy or Constrictive Pericarditis (also @ [facility])  . Hx of sick sinus syndrome 07/1999   Wtih symptomatic bradycardia - syncope (Tachy-Brady)  . 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 CARDIAC CATHETERIZATION  08/03/1999   [place] - normal Coronaries; no sign of constriction or restrictive  cardiomypathy  . CRANIECTOMY FOR DEPRESSED SKULL FRACTURE Right 11/02/2019   Procedure: DECOMPRESSIVE HEMI -CRANIECTOMY, IMPLANTATION OF SKULL FLAP TO RIGHT ABDOMEN;  Surgeon: Consuella Lose, MD;  Location: [place];  Service: Neurosurgery;  Laterality: Right;  . IR CT HEAD LTD  10/31/2019  . IR PERCUTANEOUS ART THROMBECTOMY/INFUSION INTRACRANIAL INC DIAG ANGIO  10/31/2019      . IR PERCUTANEOUS ART THROMBECTOMY/INFUSION INTRACRANIAL INC DIAG ANGIO  10/31/2019  . MITRAL VALVE REPAIR  07/1999   Both Ant& Post leaflet repair - quadrangular resection&caudal transposition, #32 Sequin Annuloplasty ring  . PACEMAKER GENERATOR CHANGE  11/26/2008   Medtronic Adapta L(? if it has been checked since 02/2013)  . PACEMAKER INSERTION  07/1999   for SSS in setting of Chronic Afib; Medtronic Kappa [id number], [id number].  Marland Kitchen RADIOLOGY WITH ANESTHESIA N/A 10/31/2019   Procedure: IR WITH ANESTHESIA;  Surgeon: Radiologist, Medication, MD;  Location: [place];  Service: Radiology;  Laterality: N/A;  . TRANSTHORACIC ECHOCARDIOGRAM  05/2018   Normal LV size and thickness.  EF estimated 50%.  Inferobasal HK and flattened septum c/w with elevated PA pressures. Mild functional mitral stenosis at rest (postoperative).  Moderate left atrial dilation and mild right atrial dilation.  Mean PA pressures estimated 52 mmHg.  Marland Kitchen VIDEO ASSISTED THORACOSCOPY (VATS)/THOROCOTOMY Left 12/31/2019   Procedure: Video Assisted Thoracoscopy (Vats) left , Drainage of Hemothorax;  Surgeon: Ivin Poot, MD;  Location: [place];  Service: Thoracic;  Laterality: Left;  Marland Kitchen VIDEO BRONCHOSCOPY N/A 01/03/2020   Procedure: Video Bronchoscopy;  Surgeon: Ivin Poot, MD;  Location: [place];  Service: Thoracic;  Laterality: N/A;    Allergies: Patient has no known allergies.  Medications: Prior to Admission medications   Medication Sig Start Date End Date Taking? Authorizing Provider  acetaminophen (TYLENOL) 500 MG tablet Take 500-1,000 mg by mouth every 6  (six) hours as needed for headache (pain).    [provider]  amantadine (SYMMETREL) 50 MG/5ML solution Place 10 mLs (100 mg total) into feeding tube 2 (two) times daily. 11/20/19   Donzetta Starch, NP  Amino Acids-Protein Hydrolys (FEEDING SUPPLEMENT, PRO-STAT SUGAR FREE 64,) LIQD Take 30 mLs by mouth 2 (two) times daily. 11/20/19   Donzetta Starch, NP  aspirin 325 MG tablet Place 1 tablet (325 mg total) into feeding tube daily. 11/21/19   Donzetta Starch, NP  atorvastatin (LIPITOR) 40 MG tablet Place 1 tablet (40 mg total) into feeding tube daily. 11/21/19   Donzetta Starch, NP  Chlorhexidine Gluconate Cloth 2 % PADS Apply 6 each topically daily at 6 (six) AM. 11/21/19   Donzetta Starch, NP  chlorhexidine gluconate, MEDLINE KIT, (PERIDEX) 0.12 % solution 15 mLs by Mouth Rinse route 2 (two) times daily. 11/20/19   Donzetta Starch, NP  diltiazem (CARDIZEM) 10 mg/ml oral suspension Place 6 mLs (60 mg total) into feeding tube every 6 (six) hours. 11/20/19   Donzetta Starch, NP  enoxaparin (LOVENOX) 40 MG/0.4ML injection Inject 0.4 mLs (40 mg total) into the skin daily. 11/20/19   Donzetta Starch, NP  insulin aspart (NOVOLOG) 100 UNIT/ML injection Inject 0-9 Units into the skin 3 (three) times daily with meals. 11/20/19   Donzetta Starch, NP  insulin aspart (NOVOLOG) 100 UNIT/ML injection Inject 8 Units into the skin 2 (two) times daily. 11/20/19   Donzetta Starch, NP  insulin glargine (LANTUS) 100 UNIT/ML injection Inject 0.2 mLs (20 Units total) into the skin 2 (two) times daily. 11/20/19   Donzetta Starch, NP  Maltodextrin-Xanthan Gum (RESOURCE THICKENUP CLEAR) POWD Take 120 g by mouth as needed (nectar thick liquids). 11/20/19   Donzetta Starch, NP  metoprolol tartrate (LOPRESSOR) 25 MG tablet Place 1 tablet (25 mg total) into feeding tube 2 (two) times daily. 11/20/19   Donzetta Starch, NP  Nutritional Supplements (FEEDING SUPPLEMENT, GLUCERNA 1.5 CAL,) LIQD Place 500 mLs into feeding tube daily. 11/20/19   Donzetta Starch, NP    polyethylene glycol (MIRALAX / GLYCOLAX) 17 g packet Place 17 g into feeding tube 2 (two) times daily. 11/20/19   Donzetta Starch, NP  senna-docusate (SENOKOT-S) 8.6-50 MG tablet Place 1 tablet into feeding tube at bedtime as needed for mild constipation. 11/20/19   Donzetta Starch, NP  senna-docusate (SENOKOT-S) 8.6-50 MG tablet Place 1 tablet into feeding tube 2 (two) times daily. 11/20/19   Donzetta Starch, NP     Family History  Problem Relation Age of Onset  . Hypertension Mother   . Diabetes Sister   . Cancer Brother        Rectal  . Lung cancer Brother     Social History   Socioeconomic History  . Marital status: Married    Spouse name: Riki Rusk   . Number of children: 2  . Years of education: 16 years.  . Highest education level: Not on file  Occupational History  . Occupation: product development    Employer: [employer]    Comment: prev worked as Education officer, environmental   Tobacco Use  . Smoking status:  Never Smoker  . Smokeless tobacco: Never Used  Vaping Use  . Vaping Use: Never used  Substance and Sexual Activity  . Alcohol use: Yes    Alcohol/week: 0.0 standard drinks    Comment: occasional, once every month or two- glass of wine   . Drug use: No  . Sexual activity: Not Currently    Partners: Male    Comment: 1st intercourse 55 yo-5 partners  Other Topics Concern  . Not on file  Social History Narrative   Lives with husband, great-niece (70)  and great-nephew (62).    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   1 son died in an automobile accident @ age 65 (2010).    She raised several nieces & nephews - now lives with her 22 y/o daughter (who recently moved back in)   Routinely walks 2-3 days/week.   Started new job through the [employer].   Social Determinants of Health   Financial Resource Strain:   . Difficulty of Paying Living Expenses:   Food Insecurity:   . Worried About Charity fundraiser in the  Last Year:   . Arboriculturist in the Last Year:   Transportation Needs:   . Film/video editor (Medical):   Marland Kitchen Lack of Transportation (Non-Medical):   Physical Activity:   . Days of Exercise per Week:   . Minutes of Exercise per Session:   Stress:   . Feeling of Stress :   Social Connections:   . Frequency of Communication with Friends and Family:   . Frequency of Social Gatherings with Friends and Family:   . Attends Religious Services:   . Active Member of Clubs or Organizations:   . Attends Archivist Meetings:   Marland Kitchen Marital Status:     Review of Systems: A 12 point ROS discussed and pertinent positives are indicated in the HPI above.  All other systems are negative.   Vital Signs: BP 128/79   Pulse 68   Temp 97.7 F (36.5 C) (Axillary)   Resp (!) 25   Ht '5\' 4"'  (1.626 m)   Wt 138 lb 3.7 oz (62.7 kg)   SpO2 100%   BMI 23.73 kg/m   Physical Exam Vitals reviewed.  Constitutional:      Comments: Trach No response  Cardiovascular:     Rate and Rhythm: Normal rate.  Pulmonary:     Comments: vent Skin:    General: Skin is warm.  Psychiatric:     Comments: Discussed G tube placement with husband [name] via phone     Imaging: CT ABDOMEN PELVIS WO CONTRAST  Result Date: 01/10/2020 CLINICAL DATA:  Hypotensive. Dropping hemoglobin. Concern for retroperitoneal hematoma. Follow-up for hemothorax. EXAM: CT CHEST, ABDOMEN AND PELVIS WITHOUT CONTRAST TECHNIQUE: Multidetector CT imaging of the chest, abdomen and pelvis was performed following the standard protocol without IV contrast. COMPARISON:  CT, 03/03/2009. FINDINGS: CT CHEST FINDINGS Cardiovascular: Heart is normal in size. No pericardial effusion. Two vessel coronary artery calcifications. Great vessels normal in caliber. No aortic atherosclerotic calcifications. Mediastinum/Nodes: Endotracheal tube is well positioned, tip 2 cm above the carina. Nasogastric tube passes into the distal stomach. No neck base,  mediastinal or hilar masses or convincing adenopathy. Trachea is unremarkable. Left PICC catheter tip projects in the superior vena cava just above the caval atrial junction. Right anterior chest wall AICD is well positioned. Lungs/Pleura: Large complex left pleural effusion. There is relative increased attenuation of  the pleural fluid dependently consistent with a hematocrit effect, indicating a hemothorax. There is complete compressive atelectasis of the left lung. Pigtail left-sided chest tube lies along the anterolateral mid left hemithorax pleural space. Small right pleural effusion. There are patchy areas of ground-glass and more confluent airspace opacities in the right lung, all lobes but most notably in the right upper lobe, suspicious for multifocal pneumonia. No pneumothorax. Musculoskeletal: No fracture or acute finding. No osteoblastic or osteolytic lesions. Previous median sternotomy. CT ABDOMEN PELVIS FINDINGS Hepatobiliary: Liver normal in overall size. Nodular liver contour. Relative enlargement of the caudate lobe. Small low-density lesion, inferior aspect of segment 6 consistent with a cyst. These findings are stable. Relative increased attenuation of the gallbladder contents consistent with viscous bile versus sludge. No evidence of acute cholecystitis. No bile duct dilation. Pancreas: Unremarkable. No pancreatic ductal dilatation or surrounding inflammatory changes. Spleen: Dense material lies along the splenic hilum, stable from the prior CT, consistent with previous aneurysm coiling. Spleen normal in size. No splenic mass. Adrenals/Urinary Tract: No adrenal masses. Kidneys normal in size, orientation and position. No renal mass or stone. No hydronephrosis. Normal ureters. Bladder decompressed with a Foley catheter. Stomach/Bowel: Normal stomach. Small bowel and colon are normal in caliber. No wall thickening. No inflammation. Normal appendix. Vascular/Lymphatic: Minimal aortic atherosclerotic  calcifications. No aneurysm. No enlarged lymph nodes. Reproductive: Uterus and bilateral adnexa are unremarkable. Other: No ascites.  No retroperitoneal hemorrhage. There is radiopaque material within the deep subcutaneous fat of the right anterior abdomen. Small fat containing umbilical hernia. Subcutaneous edema noted along the lateral pelvis. Musculoskeletal: No acute or significant osseous findings. IMPRESSION: 1. Large left hemothorax. This has mass effect displacing the left hemidiaphragm inferiorly, and causing complete atelectasis of the left lung. The pigtail chest tube lies along the anterolateral left mid hemithorax, not within the large hemothorax. 2. Right lung patchy ground-glass and more confluent opacities, most evident in the right upper lobe, suspicious for multifocal pneumonia. Small right pleural effusion. 3. No acute findings within the abdomen or pelvis. No evidence of a retroperitoneal hematoma. 4. Liver appearance is consistent with cirrhosis. Electronically Signed   By: Lajean Manes M.D.   On: 01/13/2020 11:49   CT ABDOMEN WO CONTRAST  Result Date: 01/09/2020 CLINICAL DATA:  64 year old with dysphagia and evaluate anatomy for gastrostomy tube placement. History of left VATS procedure. EXAM: CT ABDOMEN WITHOUT CONTRAST TECHNIQUE: Multidetector CT imaging of the abdomen was performed following the standard protocol without IV contrast. COMPARISON:  01/13/2020 FINDINGS: Lower chest: Partially visualized large bore left chest tube. Complex gas-filled collection in the left pleural space. Difficult to exclude a small amount of residual left pleural fluid. Patchy parenchymal densities at the right lower lobe with compressive atelectasis in both lower lobes. Cannot exclude mild infectious etiology at the right lung base. Small to moderate sized right pleural effusion. Small amount of consolidation or focal pleural fluid along the base of the lingula with an adjacent rib fracture. History of  median sternotomy. Pacer wire extends into the right ventricle. Hepatobiliary: Again noted is a slightly exophytic structure in the inferior right hepatic lobe that is low density and measures roughly 1.6 cm. This likely represents a hepatic cyst. Normal appearance of the gallbladder. Liver appears to have a nodular contour and difficult to exclude cirrhosis. Pancreas: Unremarkable. No pancreatic ductal dilatation or surrounding inflammatory changes. Spleen: Again noted is a large calcified structure involving the superomedial aspect of the spleen. Adrenals/Urinary Tract: Adrenal tissue is unremarkable. No hydronephrosis.  Stomach/Bowel: Nasogastric feeding tube extends in the stomach and the tip terminates near the proximal duodenum. Transverse colon is caudal to the stomach. Evidence for a percutaneous window for gastrostomy tube placement. No evidence for a bowel obstruction. No significant stomach distention. Vascular/Lymphatic: Multiple small lymph nodes in the periaortic region. Normal caliber of the abdominal aorta. Other: Negative for ascites. Skull flap is implanted within the subcutaneous tissues of the right anterior abdomen. Small periumbilical hernia containing fat. Musculoskeletal: Mildly displaced rib fracture in the left lateral chest at the level of the lingula. This is likely related to the previous VATS procedure. IMPRESSION: 1. Anatomy is amendable for a percutaneous gastrostomy tube placement. 2. Postsurgical changes from left VATS procedure. Residual loculated gas collection in the left pleural space. Cannot exclude a small amount of residual left pleural fluid. 3. Small to moderate amount of right pleural fluid. Probable small amount of pleural fluid adjacent to the lingula. Compressive atelectasis at the lung bases. 4. Subtle patchy densities at the right lung base are nonspecific and cannot exclude a mild infectious etiology in this area. 5. Liver has a slightly nodular contour and cannot  exclude underlying cirrhosis. Electronically Signed   By: Markus Daft M.D.   On: 01/09/2020 17:27   DG Chest 1 View  Result Date: 11/19/2019 CLINICAL DATA:  Difficulty airway for intubation. EXAM: CHEST  1 VIEW COMPARISON:  Dec 14, 2019 FINDINGS: The ETT terminates 3 cm above the carina in good position. A left-sided chest tube is identified. Near complete opacification of the left chest is again identified with a probable large associated effusion and underlying opacity. No pneumothorax on the right. The right lung is clear. The cardiomediastinal silhouette is unchanged. IMPRESSION: 1. Support apparatus as above. 2. Continued near complete opacification of the left hemithorax. Electronically Signed   By: Dorise Bullion III M.D   On: 11/20/2019 13:16   DG Abd 1 View  Result Date: 12/31/2019 CLINICAL DATA:  Abdominal distension. EXAM: ABDOMEN - 1 VIEW COMPARISON:  Radiographs 11/17/2019 and 11/09/2019.  CT 01/14/2020. FINDINGS: 1808 hours. Two views are submitted. Feeding tube projects to the level of the mid stomach. Cardiac pacemaker leads are noted. The bowel gas pattern is nonobstructive. There is gas throughout the small and large bowel. No supine evidence of free intraperitoneal air. Calcification within the left upper quadrant of the abdomen corresponds with a chronic splenic calcification on CT. Small bilateral pleural effusions are noted. IMPRESSION: Nonobstructive bowel gas pattern. Feeding tube tip in the mid stomach. Electronically Signed   By: Richardean Sale M.D.   On: 12/31/2019 18:45   DG Abd 1 View  Result Date: 12/05/2019 CLINICAL DATA:  OG tube placement EXAM: ABDOMEN - 1 VIEW COMPARISON:  November 09, 2019 FINDINGS: The distal tip of the OG tube is in the right mid abdomen, likely in the distal stomach. IMPRESSION: The distal tip of the OG tube is likely in the distal stomach. Electronically Signed   By: Dorise Bullion III M.D   On: 11/29/2019 15:13   CT HEAD WO CONTRAST  Result  Date: 12/25/2019 CLINICAL DATA:  64 year old female status post right MCA infarct, hemi craniectomy. EXAM: CT HEAD WITHOUT CONTRAST TECHNIQUE: Contiguous axial images were obtained from the base of the skull through the vertex without intravenous contrast. COMPARISON:  Head CT 01/04/2020 and earlier. FINDINGS: Brain: Small hemorrhages in the bilateral cerebellum appear mildly regressed, with slightly less edema around larger left side hemorrhage. No posterior fossa mass effect. Right MCA mixed  density infarct with hemorrhage in the temporal and inferior parietal lobe appears stable. Small volume intraventricular hemorrhage is stable. No ventriculomegaly, but there is mildly increased ex vacuo enlargement of the right frontal horn. Regressed small volume contralateral left side subarachnoid hemorrhage. Basilar cisterns are normal. No significant midline shift. Stable gray-white matter differentiation elsewhere. Vascular: Calcified atherosclerosis at the skull base. Skull: Stable right hemi craniectomy. Sinuses/Orbits: Partially visible right nasoenteric tube. Bubbly opacity in the pharynx. Visualized paranasal sinuses and mastoids are stable and well pneumatized. Other: Stable orbit and scalp soft tissues. IMPRESSION: 1. Stable hemorrhagic Right MCA infarct since 12/30/2019. Right hemi-craniectomy with no midline shift or significant intracranial mass effect. 2. Mildly regressed small cerebellar hemorrhages. No posterior fossa mass effect. 3. Stable small volume intraventricular hemorrhage, no ventriculomegaly. Trace subarachnoid hemorrhage largely resolved. 4. No new intracranial abnormality. Electronically Signed   By: Genevie Ann M.D.   On: 12/25/2019 15:19   CT HEAD WO CONTRAST  Result Date: 12/22/2019 CLINICAL DATA:  Initial evaluation for intracranial hemorrhage. Evaluate for interval ICH expansion. EXAM: CT HEAD WITHOUT CONTRAST TECHNIQUE: Contiguous axial images were obtained from the base of the skull through  the vertex without intravenous contrast. COMPARISON:  Comparison made with prior CT from earlier the same day. FINDINGS: Brain: Sequelae of prior decompressive right hemi craniectomy again seen. Evolving subacute right MCA territory infarct involving the underlying right cerebral hemispheres unchanged. Superimposed intraparenchymal hematoma at the posterior margin of the area of infarction is stable in size from previous without evidence for interval expansion. Surrounding low-density vasogenic edema is stable. Associated intraventricular hemorrhage within the right greater than left lateral ventricles also not significantly changed. Additional previously identified small left greater than right cerebellar hemorrhage yours are also not significantly changed. Mild surrounding edema without significant regional mass effect seen about the left cerebellar hemorrhage which measures approximately 1.5 cm. Additional small linear hemorrhage involving the peripheral left temporal lobe is also unchanged. Trace subarachnoid blood involving the left cerebral hemisphere again noted, stable. No other new sites of hemorrhage. No new large vessel territory infarct. Similar swelling of the brain through the craniectomy defect. No midline shift or hydrocephalus. No extra-axial fluid collection. Vascular: No hyperdense vessel. Scattered vascular calcifications noted within the carotid siphons. Skull: Prior right hemi craniectomy. Sinuses/Orbits: Globes and orbital soft tissues within normal limits. Scattered mucosal thickening again noted within the paranasal sinuses. Nasogastric tube in place. Other: None. IMPRESSION: 1. Stable head CT as compared to previous exam performed at 7:19 p.m. earlier the same day. No evidence for significant interval expansion or worsening of multifocal intracerebral hemorrhages as above. 2. No other new acute intracranial abnormality. Electronically Signed   By: Jeannine Boga M.D.   On: 12/22/2019  00:14   CT HEAD WO CONTRAST  Result Date: 12/16/2019 CLINICAL DATA:  Initial evaluation for cerebral edema about the craniectomy site. EXAM: CT HEAD WITHOUT CONTRAST TECHNIQUE: Contiguous axial images were obtained from the base of the skull through the vertex without intravenous contrast. COMPARISON:  Comparison made with most recent available CT from 11/21/2019. FINDINGS: Brain: Postoperative changes from prior decompressive right hemi craniectomy again seen. There has been continued interval evolution of previously identified right MCA territory infarct, overall similar in size and distribution. However, there has been evidence for interval hemorrhagic transformation and bleeding, with large parenchymal hematoma now seen at the posterior aspect of the infarcted territory. Hematoma measures 5.5 x 3.9 x 4.9 cm (estimated volume 53 cc). Associated intraventricular extension with blood seen within  the right greater than left lateral ventricles. Associated edema seen adjacent to the hematoma and throughout the evolving infarct, with mildly increased protrusion of the brain through the craniectomy defect. No significant midline shift. Additionally, now seen are 2 small intraparenchymal hemorrhages at the inferior cerebellum, left slightly larger than right (series 2, image 7). For reference purposes, the largest of these on the left measures 12 mm. Additional trace subarachnoid blood noted within the left cerebral hemisphere, most pronounced anteriorly (series 2, image 16). Additional small linear parenchymal hemorrhage seen involving the posterior left temporal lobe, measuring 1.5 cm in size (series 5, image 51). No other acute intracranial hemorrhage. No new acute large vessel territory infarct. No hydrocephalus. No extra-axial fluid collection. Vascular: No hyperdense vessel. Skull: Prior right hemi craniectomy. Sinuses/Orbits: Globes and orbital soft tissues within normal limits. Scattered mucosal thickening  noted within the ethmoidal air cells and maxillary sinuses. Nasogastric tube partially visualized. Mastoid air cells are clear. Other: None. IMPRESSION: 1. Continued interval evolution of right MCA territory infarct, overall similar in size and distribution from previous. However, there has been evidence for interval hemorrhagic transformation and bleeding, with large parenchymal hematoma now seen at the posterior aspect of the infarcted territory, measuring 5.5 x 3.9 x 4.9 cm (estimated volume 53 cc). Associated intraventricular extension with blood in the right greater than left lateral ventricles. No hydrocephalus or significant midline shift. 2. Interval development of 2 additional small intraparenchymal hemorrhages at the inferior cerebellum, left slightly larger than right. Additional small linear parenchymal hemorrhage involving the posterior left temporal lobe. 3. Additional trace subarachnoid blood within the left cerebral hemisphere. Critical Value/emergent results were called by telephone at the time of interpretation on 01/03/2020 at 8:35 pm to provider Dr. James Ivanoff, Who verbally acknowledged these results. Electronically Signed   By: Jeannine Boga M.D.   On: 01/03/2020 20:38   CT CHEST WO CONTRAST  Result Date: 01/03/2020 CLINICAL DATA:  Hypotensive. Dropping hemoglobin. Concern for retroperitoneal hematoma. Follow-up for hemothorax. EXAM: CT CHEST, ABDOMEN AND PELVIS WITHOUT CONTRAST TECHNIQUE: Multidetector CT imaging of the chest, abdomen and pelvis was performed following the standard protocol without IV contrast. COMPARISON:  CT, 03/03/2009. FINDINGS: CT CHEST FINDINGS Cardiovascular: Heart is normal in size. No pericardial effusion. Two vessel coronary artery calcifications. Great vessels normal in caliber. No aortic atherosclerotic calcifications. Mediastinum/Nodes: Endotracheal tube is well positioned, tip 2 cm above the carina. Nasogastric tube passes into the distal stomach. No neck base,  mediastinal or hilar masses or convincing adenopathy. Trachea is unremarkable. Left PICC catheter tip projects in the superior vena cava just above the caval atrial junction. Right anterior chest wall AICD is well positioned. Lungs/Pleura: Large complex left pleural effusion. There is relative increased attenuation of the pleural fluid dependently consistent with a hematocrit effect, indicating a hemothorax. There is complete compressive atelectasis of the left lung. Pigtail left-sided chest tube lies along the anterolateral mid left hemithorax pleural space. Small right pleural effusion. There are patchy areas of ground-glass and more confluent airspace opacities in the right lung, all lobes but most notably in the right upper lobe, suspicious for multifocal pneumonia. No pneumothorax. Musculoskeletal: No fracture or acute finding. No osteoblastic or osteolytic lesions. Previous median sternotomy. CT ABDOMEN PELVIS FINDINGS Hepatobiliary: Liver normal in overall size. Nodular liver contour. Relative enlargement of the caudate lobe. Small low-density lesion, inferior aspect of segment 6 consistent with a cyst. These findings are stable. Relative increased attenuation of the gallbladder contents consistent with viscous bile versus sludge. No  evidence of acute cholecystitis. No bile duct dilation. Pancreas: Unremarkable. No pancreatic ductal dilatation or surrounding inflammatory changes. Spleen: Dense material lies along the splenic hilum, stable from the prior CT, consistent with previous aneurysm coiling. Spleen normal in size. No splenic mass. Adrenals/Urinary Tract: No adrenal masses. Kidneys normal in size, orientation and position. No renal mass or stone. No hydronephrosis. Normal ureters. Bladder decompressed with a Foley catheter. Stomach/Bowel: Normal stomach. Small bowel and colon are normal in caliber. No wall thickening. No inflammation. Normal appendix. Vascular/Lymphatic: Minimal aortic atherosclerotic  calcifications. No aneurysm. No enlarged lymph nodes. Reproductive: Uterus and bilateral adnexa are unremarkable. Other: No ascites.  No retroperitoneal hemorrhage. There is radiopaque material within the deep subcutaneous fat of the right anterior abdomen. Small fat containing umbilical hernia. Subcutaneous edema noted along the lateral pelvis. Musculoskeletal: No acute or significant osseous findings. IMPRESSION: 1. Large left hemothorax. This has mass effect displacing the left hemidiaphragm inferiorly, and causing complete atelectasis of the left lung. The pigtail chest tube lies along the anterolateral left mid hemithorax, not within the large hemothorax. 2. Right lung patchy ground-glass and more confluent opacities, most evident in the right upper lobe, suspicious for multifocal pneumonia. Small right pleural effusion. 3. No acute findings within the abdomen or pelvis. No evidence of a retroperitoneal hematoma. 4. Liver appearance is consistent with cirrhosis. Electronically Signed   By: Lajean Manes M.D.   On: 12/23/2019 11:49   CT CHEST WO CONTRAST  Result Date: 11/18/2019 CLINICAL DATA:  Hemothorax unexplained.  On anticoagulation. EXAM: CT CHEST WITHOUT CONTRAST TECHNIQUE: Multidetector CT imaging of the chest was performed following the standard protocol without IV contrast. COMPARISON:  Radiograph yesterday, additional priors. Remote chest CT 12/20/2009 FINDINGS: Cardiovascular: Pacemaker with leads in the right atrium and ventricle. Thoracic aorta is normal in caliber. There is multi chamber cardiomegaly, with prominent left atrial dilatation. Coronary artery calcifications. Mitral annulus calcifications. No definite pericardial effusion, fluid abutting the superior heart border felt to be loculated pleural effusion. Mediastinum/Nodes: Limited assessment for adenopathy. Lack of IV contrast as well as streak artifact from right-sided pacemaker. There is a 12 mm lower paratracheal node. The  esophagus is displaced posteriorly by left atrial enlargement. No visualized thyroid nodule. Lungs/Pleura: Motion artifact limits assessment. Left mainstem bronchus is completely opacified with lobulated density just below the carina. Left lower lobe bronchus is completely opacified. Minimal air within the segmental branch of the left lower lobe, however no significant aerated lung. Left upper lobe bronchus is opacified. Minimal air within the segmental bronchus of the anterior upper lobe. Small portion of left upper lobe is aerated. Ground-glass and dependent opacities within the aerated left upper lobe. Moderate size left pleural effusion which is partially loculated. Density measurements of the pleural fluid are limited given streak artifact from pacemaker, however does not appear increased density such is hemothorax. No pneumothorax. Ground-glass opacity in the periphery of the right upper lobe, series 4, image 23. Right lung otherwise clear. There is no right pleural effusion. Upper Abdomen: Coarse calcification in the spleen, also seen on prior exam. Cirrhotic hepatic morphology. Subcapsular 13 mm low-density in the right lobe is likely cyst, but incompletely characterized. There is left adrenal thickening without dominant nodule. Musculoskeletal: Median sternotomy. No evidence of focal bone lesion or acute osseous abnormality. IMPRESSION: 1. Moderate size partially loculated left pleural effusion. 2. Significant filling of the left bronchial tree, completely filling the left mainstem bronchus, left lower lobe bronchus, and incomplete filling the left upper lobe bronchus.  This may represent aspiration. Recommend bronchoscopy for further evaluation to exclude possibility of underlying obstructive mass. 3. Complete collapse of the left lower lobe. Left upper lobe demonstrates ground-glass opacity which may be compressive atelectasis or pneumonia. 4. Small focal ground-glass opacity in the periphery of the right  upper lobe is nonspecific. 5. Multi chamber cardiomegaly with prominent left atrial enlargement which causes mass effect on the adjacent esophagus. 6. Cirrhosis. Aortic Atherosclerosis (ICD10-I70.0). Electronically Signed   By: Keith Rake M.D.   On: 12/04/2019 03:25   US RENAL  Result Date: 12/19/2019 CLINICAL DATA:  Acute renal injury EXAM: RENAL / URINARY TRACT ULTRASOUND COMPLETE COMPARISON:  None. FINDINGS: Right Kidney: Renal measurements: 11.2 x 6.1 x 4.2 cm. = volume: 147 mL . Echogenicity within normal limits. No mass or hydronephrosis visualized. Left Kidney: Renal measurements: 10.0 x 4.8 x 2.9 cm. = volume: 68 mL. Echogenicity within normal limits. No mass or hydronephrosis visualized. Bladder: Appears normal for degree of bladder distention. Other: None. IMPRESSION: No acute abnormality noted. Electronically Signed   By: Inez Catalina M.D.   On: 12/19/2019 03:03   DG Chest Port 1 View  Result Date: 01/08/2020 CLINICAL DATA:  Follow-up chest tube EXAM: PORTABLE CHEST 1 VIEW COMPARISON:  01/06/2020 FINDINGS: Cardiac shadow is stable. Postoperative changes are again identified. Feeding catheter, tracheostomy tube, left-sided PICC line and left jugular central line are again seen and stable. Left-sided chest tube is noted with the tip overlying the left apex. No pneumothorax is seen. Persistent pleural thickening is noted on the left. Small left effusion is again identified. Right lung remains clear. Pacing device is noted. IMPRESSION: Overall stable appearance of the chest. No recurrent pneumothorax is noted. Electronically Signed   By: Inez Catalina M.D.   On: 01/08/2020 08:12   DG Chest Port 1 View  Result Date: 01/06/2020 CLINICAL DATA:  Tracheostomy tube placement EXAM: PORTABLE CHEST 1 VIEW COMPARISON:  Earlier same day FINDINGS: 1646 hours. Tracheostomy tube visualized in situ with tip position approximately 4.8 cm above the base of the carina. A feeding tube passes into the stomach  although the distal tip position is not included on the film. Left IJ central line tip overlies the mid SVC level. Left chest tube remains in place without evidence for left-sided pneumothorax. Pleuroparenchymal opacity at the lower left hemithorax is stable with left pleural effusion. The cardio pericardial silhouette is enlarged. Diffuse interstitial coarsening noted right lung. Right permanent pacemaker evident. Telemetry leads overlie the chest. IMPRESSION: 1. New tracheostomy tube with tip position approximately 4.8 cm above the base of the carina. 2. No substantial interval change in exam. Left chest tube remains in place without evidence for left-sided pneumothorax. 3. Pleuroparenchymal opacity at the lower left hemithorax with left pleural effusion. Electronically Signed   By: Misty Stanley M.D.   On: 01/06/2020 17:18   DG Chest Port 1 View  Result Date: 01/06/2020 CLINICAL DATA:  Endotracheal tube present EXAM: PORTABLE CHEST 1 VIEW COMPARISON:  01/03/2020 FINDINGS: Endotracheal tube, left IJ and PICC lines, partially imaged enteric tube, and left apical chest tube again noted. No pneumothorax. Similar appearance of partially loculated left pleural effusion. Probable underlying patchy left lung atelectasis. Improving aeration at the right lung base. Stable cardiomediastinal contours.  Right chest wall pacemaker. IMPRESSION: 1. Improving aeration at the right lung base. Similar appearance of partially loculated left pleural effusion and probable underlying patchy atelectasis. 2. Stable lines and tubes. Electronically Signed   By: Addison Lank.D.  On: 01/06/2020 09:07   DG Chest Port 1 View  Result Date: 01/03/2020 CLINICAL DATA:  Pleural effusion. EXAM: PORTABLE CHEST 1 VIEW COMPARISON:  12/31/2019 FINDINGS: Sternal wires and right-sided pacemaker unchanged. Enteric tube courses into the region of the stomach and off the film as tip is not visualized. Left IJ central venous catheter unchanged.  Endotracheal tube has tip 3.4 cm above the carina. Left basilar chest tube unchanged. Left-sided PICC line unchanged. Lungs are somewhat hypoinflated demonstrate interval improvement in hazy opacification over the mid to lower lungs likely improving effusion/atelectasis. Cardiomediastinal silhouette and remainder of the exam is unchanged. IMPRESSION: 1. Interval improving hazy opacification over the mid to lower lungs likely improving effusion/atelectasis. 2.  Tubes and lines as described. Electronically Signed   By: Marin Olp M.D.   On: 01/03/2020 10:09   DG CHEST PORT 1 VIEW  Result Date: 12/31/2019 CLINICAL DATA:  Intubated.  Chest tube. EXAM: PORTABLE CHEST 1 VIEW COMPARISON:  12/30/2019 and CT chest 12/04/2019. FINDINGS: Endotracheal tube terminates approximately 4.5 cm above the carina. Left PICC and left IJ central line tips are in the SVC. Right-sided pacemaker lead tips are in the right atrium and right ventricle. Two left chest tubes are in place with tips at the apex and base of the left hemithorax. Heart is enlarged, stable. Basilar predominant mixed interstitial and airspace opacification with moderate bilateral pleural effusions, partially loculated on the left. Aeration in the right lower lobe has worsened in the interval. Left thoracotomies with surgical skin staples over the lateral left chest. IMPRESSION: 1. Partially loculated moderate left pleural effusion with 2 left chest tubes in place, status post left thoracotomy for hemothorax evacuation 01/07/2020. No pneumothorax. 2. Mixed interstitial and airspace opacification bilaterally with interval worsening in aeration in the right lower lobe. Findings may be due to a combination of atelectasis and pneumonia. Aspiration is not excluded. 3. Moderate right pleural effusion, increased. Electronically Signed   By: Lorin Picket M.D.   On: 12/31/2019 09:29   DG Chest Port 1 View  Result Date: 12/30/2019 CLINICAL DATA:  Hypoxia EXAM:  PORTABLE CHEST 1 VIEW COMPARISON:  December 29, 2019 FINDINGS: Endotracheal tube tip is 4.3 cm above the carina. Feeding tube tip is below the diaphragm. There are chest tubes on the left. Left jugular catheter tip is in the superior vena cava. Left subclavian catheter tip is in the superior vena cava. No pneumothorax. There is a pleural effusion on each side. There is consolidation in portions of the left mid and lower lung zones. No new opacity evident. There is cardiomegaly with pulmonary vascularity within normal limits. No adenopathy. No bone lesions. IMPRESSION: Tube and catheter positions as described. No pneumothorax. Areas of airspace opacity in the left mid and lower lung zone regions, stable. Pleural effusions bilaterally. Stable cardiac prominence. Electronically Signed   By: Lowella Grip III M.D.   On: 12/30/2019 07:55   DG Chest Port 1 View  Result Date: 12/29/2019 CLINICAL DATA:  Hemothorax. EXAM: PORTABLE CHEST 1 VIEW COMPARISON:  12/27/2019 FINDINGS: An endotracheal tube terminates approximately 3 cm above the carina. A left jugular catheter and left PICC both terminate over the SVC. An enteric tube courses into the abdomen with tip not imaged. A dual lead pacemaker remains in place. Three left-sided chest tubes are unchanged in position. The cardiac silhouette remains mildly enlarged. A small, likely partially loculated left pleural effusion is unchanged. There is also likely a persistent small veiling pleural effusion in the right lung  base. Asymmetric airspace opacity in the left lower lung is unchanged. Mild interstitial prominence is unchanged. No pneumothorax is identified. IMPRESSION: 1. Unchanged pleural effusions and asymmetric left lower lung consolidation or atelectasis with suspected mild pulmonary edema. 2. No pneumothorax. Electronically Signed   By: Logan Bores M.D.   On: 12/29/2019 08:14   DG CHEST PORT 1 VIEW  Result Date: 12/27/2019 CLINICAL DATA:  Hypoxia EXAM: PORTABLE  CHEST 1 VIEW COMPARISON:  December 26, 2019 FINDINGS: Endotracheal tube tip is 2.6 cm above the carina. Feeding tube tip is below the diaphragm. Central catheter tips are in the superior vena cava. Pacemaker leads are attached to the right atrium and right ventricle. Chest tubes are present on the left. No pneumothorax. There is a left pleural effusion with consolidation in the left lower lobe. There is also a right pleural effusion with mild right base atelectasis. Heart is upper normal in size with pulmonary vascularity normal. No adenopathy. No bone lesions. IMPRESSION: And catheter positions as described without pneumothorax. Bilateral pleural effusions with left lower lobe airspace opacity concerning for pneumonia, likely with associated atelectasis. Stable cardiac silhouette. Electronically Signed   By: Lowella Grip III M.D.   On: 12/27/2019 09:51   DG CHEST PORT 1 VIEW  Result Date: 12/26/2019 CLINICAL DATA:  64 year old female status post central line placement. EXAM: PORTABLE CHEST 1 VIEW COMPARISON:  Chest x-ray 12/26/2019. FINDINGS: An endotracheal tube is in place with tip 3.1 cm above the carina. A feeding tube is seen extending into the abdomen, however, the tip of the feeding tube extends below the lower margin of the image. There is a left-sided internal jugular central venous catheter with tip terminating in the superior cavoatrial junction. Right IJ Cordis with tip terminating in the mid superior vena cava. 3 left-sided chest tubes appear stable in position with tips projecting over the upper, mid and lower left hemithorax. Numerous skin staples overlying the left hemithorax. Status post median sternotomy. Right-sided pacemaker device in place with lead tips projecting over the expected location of the right atrium and right ventricle. Lung volumes are low. Opacities throughout the left mid to lower lung, which may reflect areas of atelectasis and/or consolidation. Small amount of left pleural  fluid, some of which is likely partially loculated in the lateral aspect of the left mid hemithorax. No appreciable pneumothorax. Cephalization of the pulmonary vasculature with indistinct interstitial markings. Mild cardiomegaly. Upper mediastinal contours are within normal limits. IMPRESSION: 1. Postoperative changes and support apparatus, as above. 2. The appearance the chest suggests mild pulmonary edema. 3. Atelectasis and/or consolidation in the left mid to lower lung with small left pleural effusion. Electronically Signed   By: Vinnie Langton M.D.   On: 12/26/2019 12:10   DG Chest Port 1 View  Result Date: 12/26/2019 CLINICAL DATA:  Respiratory failure EXAM: PORTABLE CHEST 1 VIEW COMPARISON:  1 day prior FINDINGS: Endotracheal tube terminates 2.4 cm above carina. Feeding tube extends beyond the inferior aspect of the film. Right internal jugular Cordis sheath and left-sided subclavian line tips in high and low SVC respectively. Median sternotomy. Dual lead pacer. Cardiomegaly accentuated by AP portable technique. 3 left chest tubes remain in place. Small left pleural effusion is similar. There is likely a tiny layering right pleural effusion. No pneumothorax. Mild interstitial edema, accentuated by low lung volumes. Left greater than right base airspace disease is most likely atelectasis. IMPRESSION: No significant change since one day prior. Congestive heart failure with left greater than right pleural  effusions and airspace disease. Left chest tubes in place, without pneumothorax. Electronically Signed   By: Abigail Miyamoto M.D.   On: 12/26/2019 09:40   DG Chest Port 1 View  Result Date: 12/25/2019 CLINICAL DATA:  Check endotracheal tube placement EXAM: PORTABLE CHEST 1 VIEW COMPARISON:  12/24/2019 FINDINGS: Endotracheal tube, feeding catheter and right jugular sheath are again seen and stable. Three left sided chest tubes are noted. No pneumothorax is noted. Lateral density is noted stable from the  prior exam likely representing some loculated fluid. Cardiac shadow is stable. Spacing device is noted. Slight increased density is noted over the bases bilaterally consistent with small effusions. No new focal confluent infiltrate is seen. IMPRESSION: Overall appearance is stable from the prior study. Electronically Signed   By: Inez Catalina M.D.   On: 12/25/2019 09:30   DG Chest Port 1 View  Result Date: 12/24/2019 CLINICAL DATA:  64 year old female status post intubation. EXAM: PORTABLE CHEST 1 VIEW COMPARISON:  Chest x-ray 12/23/2019. FINDINGS: An endotracheal tube is in place with tip 3.1 cm above the carina. A feeding tube is seen extending into the abdomen, however, the tip of the feeding tube extends below the lower margin of the image. There is a right-sided internal jugular central venous catheter with tip terminating in the mid superior vena cava. There is a left upper extremity PICC with tip terminating in the superior cavoatrial junction. 3 left-sided chest tubes are noted, 1 with tip in the medial aspect of the left apex, another with tip in the lateral upper left hemithorax, and the other with tip in the medial left lower hemithorax, similar to the prior study. No appreciable pneumothorax. Retrocardiac opacity completely obscuring the left hemidiaphragm, similar to the prior study, which may reflect atelectasis and/or consolidation. Opacity in the mid to upper left hemithorax laterally adjacent to the chest tube, likely some loculated fluid. Small to moderate right pleural effusion. Probable subsegmental atelectasis at the right lung base. No evidence of pulmonary edema. Mild cardiomegaly. Upper mediastinal contours are within normal limits. Status post median sternotomy. Right-sided pacemaker device in place with lead tips projecting over the expected location of the right atrium and right ventricle. Surgical clips project over the left chest wall laterally. IMPRESSION: 1. Postoperative changes  and support apparatus, as detailed above. 2. Otherwise, there is no significant interval change in the radiographic appearance the chest, as detailed above. Electronically Signed   By: Vinnie Langton M.D.   On: 12/24/2019 09:59   DG Chest Port 1 View  Result Date: 12/23/2019 CLINICAL DATA:  Evaluate ET tube placement. EXAM: PORTABLE CHEST 1 VIEW COMPARISON:  12/22/2019 FINDINGS: There is a right chest wall pacer device with leads in the right atrial appendage and right ventricle. Left arm PICC line tip is at the cavoatrial junction. Right IJ catheter tip is in the distal SVC. A feeding tube is in place with tip below the GE junction and below the level of the field of view. The endotracheal tube tip is situated just above the carina. There are 2 left chest tubes in place. Left pleural effusion containing gas is unchanged from prior exam. Increase in right pleural effusion with progressive veil like opacification of the right midlung and right base. IMPRESSION: 1. Increase in right pleural effusion with progressive veil like opacification of the right midlung and right base. 2. ETT tip is situated just above the carina. Electronically Signed   By: Kerby Moors M.D.   On: 12/23/2019 09:35  DG CHEST PORT 1 VIEW  Result Date: 12/22/2019 CLINICAL DATA:  Follow-up exam. EXAM: PORTABLE CHEST 1 VIEW COMPARISON:  01/13/2020 and older exams. FINDINGS: There has been no significant interval change in lung aeration since the most recent prior study. Airspace opacification is noted in the left mid to lower lung obscuring the left heart border and left hemidiaphragm. The left upper lung shows hazy opacity and vascular congestion and interstitial thickening. There is hazy opacity, vascular congestion interstitial throughout the right lung. No new lung abnormalities. No pneumothorax. No mediastinal widening. The endotracheal tube, nasogastric tube, right internal jugular Cordis, 2 left-sided chest tubes and left PICC are  stable. IMPRESSION: 1. No significant change from the most recent prior study. No pneumothorax. 2. Persistent lung opacities with more dense consolidation or atelectasis on the left and more diffuse pulmonary pulmonary edema Electronically Signed   By: Lajean Manes M.D.   On: 12/22/2019 08:26   DG Chest Portable 1 View  Result Date: 01/09/2020 CLINICAL DATA:  Status post left hemothorax evaluation, previous median sternotomy EXAM: PORTABLE CHEST 1 VIEW COMPARISON:  12/20/2019 FINDINGS: Single frontal view of the chest demonstrates endotracheal tube overlying tracheal air column, tip approximately 2.6 cm above carina. Enteric catheter passes below diaphragm tip excluded by collimation. Right internal jugular Cordis, left-sided PICC, and dual lead pacer unchanged. Left-sided chest tubes are again noted, with decreased left pleural effusion. There is improved aeration of the left lung, with residual consolidation at the left mid and lower lung zones. Increased vascular congestion with perihilar edema since prior study. No pneumothorax. IMPRESSION: 1. Decreased left pleural effusion with improved aeration of the left lung. 2. Slight worsening of volume status with increasing perihilar edema. 3. Support devices as above. Electronically Signed   By: Randa Ngo M.D.   On: 01/05/2020 19:12   DG Chest Portable 1 View  Result Date: 12/20/2019 CLINICAL DATA:  Status post left hemithorax evacuation. EXAM: PORTABLE CHEST 1 VIEW COMPARISON:  December 21, 2019 FINDINGS: Multiple sternal wires are noted. An endotracheal tube is seen with its distal tip approximately 3.4 cm from the carina. There is stable nasogastric tube and left-sided PICC line positioning. Interval right internal jugular venous catheter placement is suspected with its distal tip overlying the distal aspect of the superior vena cava. Interval placement of 3 separate left-sided chest tubes is seen with interval removal of the additional left-sided pleural  drainage catheter noted on the prior study. There is complete opacification of the left hemidiaphragm. This represents a new finding when compared to the prior study. Marked severity right to left shift of the mediastinal structures is seen. The visualized skeletal structures are unremarkable. IMPRESSION: 1. Complete opacification of the left hemidiaphragm with marked severity right to left shift of the mediastinal structures. Findings are consistent with interval left hemithorax evacuation since the prior exam. 2. Multiple left-sided chest tube placement since the prior study. Electronically Signed   By: Virgina Norfolk M.D.   On: 12/19/2019 18:08   DG CHEST PORT 1 VIEW  Result Date: 01/03/2020 CLINICAL DATA:  Endotracheal tube adjustment. EXAM: PORTABLE CHEST 1 VIEW COMPARISON:  5 minutes ago. FINDINGS: Retraction of the endotracheal tube tip now 3.4 cm from the carina. No other interval change. Enteric tube, left upper extremity PICC, left pigtail catheter remain in place. Consolidation throughout the left hemithorax with lobulated contours superiorly. IMPRESSION: 1. Retraction of endotracheal tube, tip now 3.4 cm from the carina. 2. No other interval change from 5 minutes prior. Electronically  Signed   By: Keith Rake M.D.   On: 12/22/2019 03:29   Portable Chest x-ray  Result Date: 01/01/2020 CLINICAL DATA:  Endotracheal tube present. EXAM: PORTABLE CHEST 1 VIEW COMPARISON:  Radiograph yesterday.  Chest CT 11/26/2019 FINDINGS: Endotracheal tube tip in the right mainstem, subsequently retracted. Weighted enteric tube tip below the diaphragm not included in the field of view. Left upper extremity PICC tip projects over the SVC. Right-sided pacemaker in place. Left pigtail catheter in place. Dense consolidation throughout the left hemithorax with lobulated contours superiorly. No definite pneumothorax. Stable heart size, post median sternotomy. Previous right upper lobe opacity is partially obscured by  overlying artifacts. IMPRESSION: 1. Endotracheal tube tip in the right mainstem, subsequently retracted. 2. Dense consolidation throughout the left hemithorax with lobulated contours superiorly. Left pigtail catheter in place. No definite pneumothorax. 3. Previous right upper lobe opacity is partially obscured by overlying artifacts. Electronically Signed   By: Keith Rake M.D.   On: 01/13/2020 03:28   DG Chest Port 1 View  Result Date: 12/20/2019 CLINICAL DATA:  Acute respiratory failure.  Follow-up exam. EXAM: PORTABLE CHEST 1 VIEW COMPARISON:  12/18/2019 and older studies. FINDINGS: New hazy airspace opacities noted in the right upper lobe. Airspace opacities in the left mid lung with more confluent lung base opacity is stable from the prior exam. Stable left lateral chest tube. No pneumothorax. There is a well-defined pleural base opacity along the left mid hemithorax that is likely loculated pleural fluid. Probable small right pleural effusion. Left pleural effusion, stable. Endotracheal tube has been removed. Nasogastric tube has been replaced with an enteric tube passing below the diaphragm and below the included field of view. Stable left PICC and right anterior chest wall sequential pacemaker. IMPRESSION: 1. New right upper lobe hazy airspace lung opacity. This may reflect asymmetric edema or infection. 2. Left mid and lower lung zone opacity is stable consistent with a combination of pleural fluid with atelectasis or infection or less likely asymmetric edema. 3. Status post removal of the endotracheal and nasogastric tubes. New enteric feeding tube passes below the diaphragm. 4. Focal pleural based opacity along the left mid hemithorax consistent loculated left pleural fluid, which appears new from the prior exam. Electronically Signed   By: Lajean Manes M.D.   On: 12/20/2019 08:41   DG Chest Port 1 View  Result Date: 12/18/2019 CLINICAL DATA:  Intubation, respiratory failure EXAM: PORTABLE  CHEST 1 VIEW COMPARISON:  12/17/2019 FINDINGS: Interval retraction of endotracheal tube, projecting just below the thoracic inlet, approximately 6 cm above the carina. Left upper extremity PICC. Otherwise unchanged examination with left-sided pigtail chest tube and pleural effusion. The right lung is normally aerated. Cardiomegaly. IMPRESSION: 1. Interval retraction of endotracheal tube, projecting just below the thoracic inlet, approximately 6 cm above the carina. 2. Otherwise unchanged examination with left-sided pigtail chest tube and pleural effusion. No new airspace opacity. Electronically Signed   By: Eddie Candle M.D.   On: 12/18/2019 09:36   DG Chest Port 1 View  Result Date: 12/17/2019 CLINICAL DATA:  Endotracheal tube and chest tube status post respiratory failure. EXAM: PORTABLE CHEST 1 VIEW COMPARISON:  12/16/2019 chest radiograph. FINDINGS: ETT is unchanged. Partially imaged NG tube terminates outside field of view. Left PICC tip overlies the superior cavoatrial junction. Left pleural pigtail catheter is unchanged. Right chest wall pacing device. Left lung volume loss with large pleural effusion and confluent pulmonary opacities, unchanged. Post sternotomy sequela. Partially obscured cardiomediastinal silhouette. IMPRESSION: Stable support  lines and tubes. Left lung opacities and large pleural effusion, unchanged. Electronically Signed   By: Primitivo Gauze M.D.   On: 12/17/2019 09:19   DG Chest Port 1 View  Result Date: 12/16/2019 CLINICAL DATA:  Left-sided pleural effusion. EXAM: PORTABLE CHEST 1 VIEW COMPARISON:  Dec 15, 2019 FINDINGS: ETT is 2.5 cm above the carina. NG tube is seen below the diaphragm. Left-sided PICC is seen with the tip at the superior cavoatrial junction. A left-sided pigtail catheter is noted. Overlying median sternotomy wires and a right-sided pacemaker seen. Again noted is a large left pleural effusion which is unchanged from the prior exam. There is hazy airspace  opacity at the left lung base which may be due to combination of effusion/consolidation. The right lung is clear. No acute osseous abnormality. IMPRESSION: Lines and tubes in stable position. Large left pleural effusion with left lung base airspace opacity which may be due to layering effusion and/or consolidation. Electronically Signed   By: Prudencio Pair M.D.   On: 12/16/2019 06:06   DG CHEST PORT 1 VIEW  Result Date: 12/15/2019 CLINICAL DATA:  Pleural effusion.  Hypoxia EXAM: PORTABLE CHEST 1 VIEW COMPARISON:  Dec 14, 2019 FINDINGS: Endotracheal tube tip is 3.7 cm above the carina. Nasogastric tube tip and side port are below the diaphragm. Central catheter tip is in the superior vena cava near the cavoatrial junction. No pneumothorax. Chest tube remains on the left without pneumothorax. Large pleural effusion noted on the left, stable, with consolidation throughout portions of the left mid and lower lung zones. The right lung is clear. Heart is mildly enlarged, stable, with pulmonary vascularity normal. No adenopathy. No bone lesions. IMPRESSION: Tube and catheter positions as described without evident pneumothorax. Sizable left pleural effusion with consolidation in portions of the left mid and lower lung zones. Right lung remains clear. Stable cardiac prominence. Electronically Signed   By: Lowella Grip III M.D.   On: 12/15/2019 09:55   DG CHEST PORT 1 VIEW  Result Date: 12/06/2019 CLINICAL DATA:  Pleural effusion. EXAM: PORTABLE CHEST 1 VIEW COMPARISON:  Dec 14, 2019 FINDINGS: Prominent skin fold over the right lateral chest. No pneumothorax. A left-sided chest tube remains in place. There is a moderate to large left effusion, smaller in the interval. The left PICC line is stable. The ETT is in good position. Stable pacemaker. The NG tube terminates below today's film. No other changes. IMPRESSION: 1. Support apparatus as above. 2. A moderate to large left effusion remains although it is smaller  in the interval. There may be a loculated component towards the apex. Electronically Signed   By: Dorise Bullion III M.D   On: 12/10/2019 18:31   DG CHEST PORT 1 VIEW  Result Date: 11/18/2019 CLINICAL DATA:  PICC line EXAM: PORTABLE CHEST 1 VIEW COMPARISON:  Chest radiograph 11/16/2019 FINDINGS: Interval placement of a left upper extremity PICC with tip projecting in the upper right atrium. Otherwise stable support apparatus including endotracheal tube, nasogastric tube, left chest tube and right chest pacer. Stable cardiomediastinal contours. There is persistent complete opacification of the left hemithorax. The right lung is clear. No evidence of pneumothorax. No acute finding in the visualized skeleton. IMPRESSION: 1. Interval placement of a left upper extremity PICC with tip projecting in the upper right atrium. 2. Persistent complete opacification of the left hemithorax. These results will be called to the ordering clinician or representative by the Radiologist Assistant, and communication documented in the PACS or [employer]. Electronically Signed  By: Audie Pinto M.D.   On: 11/15/2019 16:14   DG CHEST PORT 1 VIEW  Result Date: 12/08/2019 CLINICAL DATA:  Respiratory distress EXAM: PORTABLE CHEST 1 VIEW COMPARISON:  Dec 12, 2017 chest radiograph; chest CT Dec 14, 2019 FINDINGS: There is extensive opacification throughout most of the left lung with only a small amount of aeration in the left apex. Right lung is clear. There is stable cardiomegaly with pacemaker leads attached to the right atrium and right ventricle. No adenopathy evident in areas that can be assessed for potential adenopathy. No bone lesions. IMPRESSION: Near complete opacification of the left lung, likely due to combination of consolidation and effusion. Note that CT earlier in the day showed apparent fluid in the left main bronchus. Question aspiration with mucous plugging causing the opacification on the left. This  finding may warrant bronchoscopy given the current radiographic findings and recent CT appearance. Right lung clear.  Stable cardiac prominence. Electronically Signed   By: Lowella Grip III M.D.   On: 12/13/2019 08:26   DG CHEST PORT 1 VIEW  Result Date: 12/13/2019 CLINICAL DATA:  Pleural effusion.  Recent thoracentesis EXAM: PORTABLE CHEST 1 VIEW COMPARISON:  Dec 12, 2019 and may 03/05/2020 FINDINGS: No pneumothorax. Left pleural effusion smaller compared to 1 day prior. There is residual pleural effusion on the left with the patchy consolidation throughout portions of the left mid and lower lung regions. Right lung clear. Heart size and pulmonary vascularity are normal. Pacemaker leads are attached to the right atrium and right ventricle. No adenopathy. No bone lesions. IMPRESSION: Left pleural effusion smaller than 1 day prior. No pneumothorax. There is a combination of residual pleural effusion as well as consolidation throughout the left mid and lower lung zone regions. Right lung clear. Stable cardiac silhouette. Postoperative changes noted. Electronically Signed   By: Lowella Grip III M.D.   On: 12/13/2019 13:36   EEG adult  Result Date: 12/26/2019 Lora Havens, MD     12/26/2019  1:18 PM Patient Name: RAETTA AGOSTINELLI MRN: [id number] Epilepsy Attending: Lora Havens Referring Physician/Provider: Noe Gens, NP Date: 12/26/2019 Duration: 24.51 mins Patient history: 64 year old female with right temporal and posterior fossa hematoma s/p hemicraniectomy who was noted to have seizure-like activity.  EEG evaluate for seizures. Level of alertness: comatose AEDs during EEG study: None Technical aspects: This EEG study was done with scalp electrodes positioned according to the 10-20 International system of electrode placement. Electrical activity was acquired at a sampling rate of '500Hz'  and reviewed with a high frequency filter of '70Hz'  and a low frequency filter of '1Hz' . EEG data were  recorded continuously and digitally stored. Description: EEG showed continuous generalized and lateralized right hemisphere 3 to 5 Hz theta-delta slowing.  Sharp transients were also seen in the right frontocentral region.  Hyperventilation and photic stimulation were not performed.   ABNORMALITY -Continuous slow, generalized and lateralized right hemisphere IMPRESSION: This study is suggestive of cortical dysfunction in the right hemisphere consistent with underlying craniotomy.  Additionally, there is evidence of severe diffuse encephalopathy, nonspecific to etiology.  No seizures or definite epileptiform discharges were seen throughout the recording. If concern for interictal/ictal activity persists, consider long-term EEG. Priyanka Barbra Sarks   Overnight EEG with video  Result Date: 12/27/2019 Lora Havens, MD     12/28/2019  8:50 AM Patient Name: DANYAH GUASTELLA MRN: [id number] Epilepsy Attending: Lora Havens Referring Physician/Provider: Noe Gens, NP Duration: 12/26/2019 1447 to 12/27/2019 1447  Patient  history: 64 year old female with right temporal and posterior fossa hematoma s/p hemicraniectomy who was noted to have seizure-like activity.  EEG evaluate for seizures.  Level of alertness: comatose  AEDs during EEG study: LEV  Technical aspects: This EEG study was done with scalp electrodes positioned according to the 10-20 International system of electrode placement. Electrical activity was acquired at a sampling rate of '500Hz'  and reviewed with a high frequency filter of '70Hz'  and a low frequency filter of '1Hz' . EEG data were recorded continuously and digitally stored.  Description: EEG showed continuous generalized and lateralized right hemisphere 3 to 5 Hz theta-delta slowing.  Sharp waves were also seen in the right frontocentral region which at times appear rhythmic when patient is stimulated.  Hyperventilation and photic stimulation were not performed.   Event button was pressed on  12/26/2019 at 2332 and on 12/27/2019 0521 during which patient was noted to have left side semi- rhythmic head jerking with eyes open, lasting about 10 mins. Concomitant eeg showed rhythmic 4-'6hz'  theta slowing admixed with sharp waves which gradually evolved to involve bifrontal region.  ABNORMALITY - Focal seizure, right frontocentral region - Sharp waves, right frontocentral region - Continuous slow, generalized and lateralized right hemisphere  IMPRESSION: This study showed two seizures arising from right frontocentral region during which patient was noted to have left side semi- rhythmic head jerking with eyes open, on 12/26/2019 at 2332 and on 12/27/2019 0521, lasting about 10 mins. Additionally, there is cortical dysfunction in the right hemisphere consistent with underlying craniotomy as well as severe diffuse encephalopathy, nonspecific to etiology.  Priyanka O Yadav   [place] EKG SITE RITE  Result Date: 12/08/2019 If [facility] image not attached, placement could not be confirmed due to current cardiac rhythm.   Labs:  CBC: Recent Labs    01/07/20 0419 01/08/20 0412 01/11/20 0330 01/12/20 0611  WBC 7.9 8.0 10.1 9.5  HGB 7.1* 7.2* 7.2* 6.9*  HCT 23.3* 23.6* 23.8* 23.1*  PLT 99* 109* 198 210    COAGS: Recent Labs    10/31/19 1744 11/02/19 0243 12/24/2019 0619 01/12/2020 1856 12/22/19 0339 01/06/20 1218  INR 1.2   < > 1.8* 0.9 1.1 1.4*  APTT 27  --  40*  --   --  47*   < > = values in this interval not displayed.    BMP: Recent Labs    01/09/20 0443 01/10/20 0340 01/11/20 0330 01/12/20 0322  NA 136 135 134* 135  K 5.1 4.2 4.4 5.0  CL 97* 95* 95* 96*  CO2 '22 27 26 24  ' GLUCOSE 109* 162* 141* 155*  BUN 65* 39* 65* 89*  CALCIUM 7.8* 7.6* 7.7* 8.0*  CREATININE 3.86* 2.47* 3.71* 4.43*  GFRNONAA 12* 20* 12* 10*  GFRAA 13* 23* 14* 11*    LIVER FUNCTION TESTS: Recent Labs    12/26/2019 1856 12/31/2019 1856 12/23/19 0427 12/23/19 0427 12/25/19 0456 12/26/19 1530  01/05/20 0412 01/05/20 1832 01/06/20 0824 01/11/20 0955  BILITOT 1.4*  --  0.8  --  0.7  --   --   --  0.8  --   AST 262*  --  334*  --  71*  --   --   --  209*  --   ALT 94*  --  150*  --  52*  --   --   --  119*  --   ALKPHOS 123  --  145*  --  129*  --   --   --  966*  --   PROT 5.3*  --  5.9*  --  6.0*  --   --   --  7.1  --   ALBUMIN 2.0*   < > 2.0*   < > 1.7*   < > 1.5* 1.4* 1.4* 1.5*   < > = values in this interval not displayed.    TUMOR MARKERS: No results for input(s): AFPTM, CEA, CA199, CHROMGRNA in the last 8760 hours.  Assessment and Plan:  CVA Deconditioning Dysphagia Need for long term care Scheduled for percutaneous gastric tube placement Risks and benefits image guided gastrostomy tube placement was discussed with the patient's husband [name] via phone including, but not limited to the need for a barium enema during the procedure, bleeding, infection, peritonitis and/or damage to adjacent structures.  All questions were answered, he is agreeable to proceed. Consent signed and in chart.  Thank you for this interesting consult.  I greatly enjoyed meeting ISRA LINDY and look forward to participating in their care.  A copy of this report was sent to the requesting provider on this date.  Electronically Signed: Lavonia Drafts, PA-C 01/12/2020, 9:50 AM   I spent a total of 40 Minutes    in face to face in clinical consultation, greater than 50% of which was counseling/coordinating care for percutaneous gastric tube placement

## 2020-01-12 NOTE — Progress Notes (Signed)
NAME:  Krystal Little, MRN:  [id number], DOB:  February 08, 1956, LOS: 36 ADMISSION DATE:  11/29/2019, CONSULTATION DATE:  5/29 REFERRING MD:  Letta Pate, CHIEF COMPLAINT:  Chest congestion   Brief History   61 female with an extensive cardiac history was admitted to [facility] in April in the setting of a right MCA stroke, had a right interventional radiology guided thrombectomy, later had significant brain edema and underwent craniectomy, ultimately transferred to inpatient rehab.  Pulmonary and critical care medicine was consulted on May 29 in the setting of tachypnea and an enlarging left-sided pleural effusion. Thoracentesis was performed which showed a hemothorax.  A CT chest was then ordered showing obstruction of her left mainstem bronchus and a loculated left pleural effusion.  On May 30 she was moved to the ICU for chest tube placement and bronchoscopy.  Underwent intrapleural TPA but required VATS eventually, course further complicated by ICH , seizures and AKI requiring CRRT  Past Medical History  Status post mitral valve repair 2001 Status post cardiac pacemaker 2001 History of sick sinus syndrome Hypertension Diabetes mellitus type 2 Chronic atrial fibrillation Right MCA stroke 2021  Significant Hospital Events   4/16 IR thrombectomy R MCA stroke 4/25 PCCM sign off 5/6 transfer to [facility] 5/29 PCCM consult left pleural effusion, thoracentesis 5/30 worsening tachypnea. Move to ICU for intubation to facilitate bronchoscopy and chest tube placement 5/31 TPA/ pulmonzyme started for 3 days for persistent left effusion 6/01 Afebrile, low-dose Levophed, Precedex, Minimal output on chest tube- pulmozyme / tpa x 2,  6/02 Weaned x 7 hours yesterday PSV 15/5, pulmozyme and TPA completed 3 days 6/03 afib with RVR placed on amio gtt overnight , 1U PRBC for Hb 7.1  6/05 extubated successfully, Repeat TPA/Pulmonzyme given again  6/06 acute decompensation overnight,  hemorrhagic shock > to OR for left VATS with 2L hemothorax drained.  CT head with new ICH> neurosurgery consulted 6/09 More awake but agitated on PSV weaning  6/12: Seizures + on EEG   Consults:  PCCM  Procedures:  5/29 Thoracentesis >> 1.5 L bloody fluid 5/30 ETT >  5/30 L pigtail chest tube >  Bronchoscopy 5/30 >>  large, thick, grey mucus plug completely occluding the left mainstem bronchus.  [facility] HD cath 6/12 >  Significant Diagnostic Tests:  Thoracentesis left chest 5/29 >> 8K WBC, 78% WBC, protein 4.0, LDH 182 CT chest abdomen pelvis 6/6 >> large left hemithorax, patchy right lung opacities with small right effusion.  No acute abdominal process CT head 6/6 >> evolution of right MCA infarct with hemorrhagic transformation with large intraparenchymal hematoma, 2 additional small hemorrhages in the inferior cerebellum and left temporal lobe.  Trace subarachnoid blood. CT Head 6/10 >> stable hemorrhagic R MCA.  Right hemi-craniectomy with no midline shift or mass effect.  Mildly regressed small cerebellar hemorrhages.  Stable small volume intraventricular hemorrhage, no ventriculomegaly.  Trace subarachnoid hemorrhage largely resolved.  6/12 EEG: right frontocentral seizures X2   Micro Data:   RVP 4/16 neg  MRSA PCR  4/17 neg   ======== 5/29 Left pleural fluid culture > negative 5/29 left pleural fluid cytology >> No malignant cells identified. Blood and acute inflammation.  5/30 BAL >> rare candida albicans 6/6 L Pleural Fluid >> negative 6/6 BCx2 >> negative   Antimicrobials:  Cefepime 4/24 >> 4/25 Zosyn 4/28 >> 5/4 Zosyn 5/30 >> 6/5 Cefepime 6/6 >> 6/12  Interim history/subjective:  6/28: pt drowsy on ihd this am. Denies pain. Moves RUE.  6/27:More awake. Denies pain.  Objective   Blood pressure 129/63, pulse 64, temperature 97.8 F (36.6 C), temperature source Oral, resp. rate (!) 25, height _0  (1.626 m), weight 62.7 kg, SpO2 100 %.    Vent Mode: PRVC FiO2  (%):  [30 %] 30 % Set Rate:  [20 bmp] 20 bmp Vt Set:  [430 mL] 430 mL PEEP:  [5 cmH20] 5 cmH20 Plateau Pressure:  [15 cmH20-22 cmH20] 20 cmH20   Intake/Output Summary (Last 24 hours) at 01/12/2020 0854 Last data filed at 01/12/2020 0700 Gross per 24 hour  Intake 1568 ml  Output 500 ml  Net 1068 ml   Filed Weights   01/11/20 0500 01/12/20 0500 01/12/20 0645  Weight: 62.1 kg 62.7 kg 62.7 kg    Examination:  GEN: NAD, resting comfortably on dialysis HEENT:trach in place, minimal secretions CV: RRR, ext warm PULM: clear, no accessory muscle use GI: Soft, +BS, rectal tube in place EXT: Minimal anasarca, L edema NEURO: Moves R side to command, L hemiparesis PSYCH: RASS 0 SKIN: No rashes  Lab Results  Component Value Date   WBC 9.5 01/12/2020   HGB 6.9 (LL) 01/12/2020   HCT 23.1 (L) 01/12/2020   MCV 100.4 (H) 01/12/2020   PLT 210 01/12/2020   Lab Results  Component Value Date   CREATININE 4.43 (H) 01/12/2020   BUN 89 (H) 01/12/2020   NA 135 01/12/2020   K 5.0 01/12/2020   CL 96 (L) 01/12/2020   CO2 24 01/12/2020    Assessment & Plan:   Acute Respiratory Failure with Hypoxemia- trach 6/22 Mucus Plugging, Suspected Aspiration   Left loculated effusion complicated by hemothorax post VATS decortications: - TC trials daily as tolerated currently on PRVC - VAP prevention bundle Macrocytic anemia:  -check b12 and folate -transfusing 1U PRBC -no overt bleeding noted -check cxr   AKI secondary to shock - no evidence of obstruction on renal US. 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   -No neurosurgical intervention -Supportive care -PEG this week concurrent with tunneled HD catheter  Status Epilepticus, likely related to underlying stroke history :EEG off 6/14 -Keppra and Dilantin per neuro, as needed  Ativan for seizure-like activity  DM2 with Hyperglycemia -CBG, SSI plus Lantus 5 q 12  Chronic Atrial Fibrillation s/p Pacemaker -Telemetry -Full dose aspirin and subQ heparin will have to do for foreseeable future - Oral amiodarone  Moderate Protein Calorie Malnutrition  -Continue tube feeding once tunneled line in  Goals of Care -Full code, should be stable for LTACH vs. SNF depending on if we can wean from vent  Best practice:  Diet: TF  Pain/Anxiety/Delirium protocol (if indicated): fent prn VAP protocol (if indicated): yes DVT prophylaxis: Heparin subQ GI prophylaxis: PPI Glucose control: see above Mobility: BR Code Status: full code Family Communication: updated 6/28 Disposition:  Awaiting LTACH bed, some issues on family preference   Critical care time: The patient is critically ill with multiple organ systems failure and requires high complexity decision making for assessment and support, frequent evaluation and titration of therapies, application of advanced monitoring technologies and extensive interpretation of multiple databases.  Critical care time 34 mins. This represents my time independent of the NPs time taking care of the pt. This is excluding procedures.    [facility] 01/12/2020, 8:54 AM

## 2020-01-12 NOTE — Progress Notes (Signed)
Pt. Transitioned to trach collar, continue to monitor. 100%, HR62 RR-23

## 2020-01-13 ENCOUNTER — Inpatient Hospital Stay (HOSPITAL_COMMUNITY): Payer: Medicare Other

## 2020-01-13 HISTORY — PX: IR FLUORO GUIDE CV LINE RIGHT: IMG2283

## 2020-01-13 HISTORY — PX: IR GASTROSTOMY TUBE MOD SED: IMG625

## 2020-01-13 HISTORY — PX: IR US GUIDE VASC ACCESS RIGHT: IMG2390

## 2020-01-13 LAB — TYPE AND SCREEN
ABO/RH(D): O POS
Antibody Screen: NEGATIVE
Unit division: 0

## 2020-01-13 LAB — GLUCOSE, CAPILLARY
Glucose-Capillary: 103 mg/dL — ABNORMAL HIGH (ref 70–99)
Glucose-Capillary: 111 mg/dL — ABNORMAL HIGH (ref 70–99)
Glucose-Capillary: 128 mg/dL — ABNORMAL HIGH (ref 70–99)
Glucose-Capillary: 148 mg/dL — ABNORMAL HIGH (ref 70–99)
Glucose-Capillary: 32 mg/dL — CL (ref 70–99)
Glucose-Capillary: 82 mg/dL (ref 70–99)
Glucose-Capillary: 97 mg/dL (ref 70–99)

## 2020-01-13 LAB — BPAM RBC
Blood Product Expiration Date: 202107292359
ISSUE DATE / TIME: 202106280844
Unit Type and Rh: 5100

## 2020-01-13 LAB — BASIC METABOLIC PANEL
Anion gap: 11 (ref 5–15)
BUN: 45 mg/dL — ABNORMAL HIGH (ref 8–23)
CO2: 27 mmol/L (ref 22–32)
Calcium: 8 mg/dL — ABNORMAL LOW (ref 8.9–10.3)
Chloride: 98 mmol/L (ref 98–111)
Creatinine, Ser: 2.68 mg/dL — ABNORMAL HIGH (ref 0.44–1.00)
GFR calc Af Amer: 21 mL/min — ABNORMAL LOW (ref >60)
GFR calc non Af Amer: 18 mL/min — ABNORMAL LOW (ref >60)
Glucose, Bld: 55 mg/dL — ABNORMAL LOW (ref 70–99)
Potassium: 4.3 mmol/L (ref 3.5–5.1)
Sodium: 136 mmol/L (ref 135–145)

## 2020-01-13 LAB — CBC
HCT: 27.9 % — ABNORMAL LOW (ref 36.0–46.0)
Hemoglobin: 8.5 g/dL — ABNORMAL LOW (ref 12.0–15.0)
MCH: 29.6 pg (ref 26.0–34.0)
MCHC: 30.5 g/dL (ref 30.0–36.0)
MCV: 97.2 fL (ref 80.0–100.0)
Platelets: 225 10*3/uL (ref 150–400)
RBC: 2.87 MIL/uL — ABNORMAL LOW (ref 3.87–5.11)
RDW: 21.7 % — ABNORMAL HIGH (ref 11.5–15.5)
WBC: 11.6 10*3/uL — ABNORMAL HIGH (ref 4.0–10.5)
nRBC: 0 % (ref 0.0–0.2)

## 2020-01-13 MED ORDER — DEXTROSE 50 % IV SOLN: 25.0000 g | INTRAVENOUS | Status: AC

## 2020-01-13 MED ORDER — IOHEXOL 300 MG/ML  SOLN
50.0000 mL | Freq: Once | INTRAMUSCULAR | Status: AC | PRN
  Administered 2020-01-13: 15 mL

## 2020-01-13 MED ORDER — FENTANYL CITRATE (PF) 100 MCG/2ML IJ SOLN
INTRAMUSCULAR | Status: AC
  Filled 2020-01-13: qty 2

## 2020-01-13 MED ORDER — CEFAZOLIN SODIUM-DEXTROSE 2-4 GM/100ML-% IV SOLN: 2.0000 g | INTRAVENOUS | Status: AC

## 2020-01-13 MED ORDER — DEXTROSE 50 % IV SOLN
INTRAVENOUS | Status: AC
  Administered 2020-01-13: 25 g via INTRAVENOUS
  Filled 2020-01-13: qty 50

## 2020-01-13 MED ORDER — HEPARIN SODIUM (PORCINE) 1000 UNIT/ML IJ SOLN
INTRAMUSCULAR | Status: AC | PRN
  Administered 2020-01-13: 3.2 [IU] via INTRAVENOUS

## 2020-01-13 MED ORDER — CHLORHEXIDINE GLUCONATE CLOTH 2 % EX PADS
6.0000 | MEDICATED_PAD | Freq: Every day | CUTANEOUS | Status: DC
  Administered 2020-01-15 – 2020-01-16 (×2): 6 via TOPICAL

## 2020-01-13 MED ORDER — GELATIN ABSORBABLE 12-7 MM EX MISC
CUTANEOUS | Status: AC | PRN
  Administered 2020-01-13: 1 via TOPICAL

## 2020-01-13 MED ORDER — CEFAZOLIN SODIUM-DEXTROSE 2-4 GM/100ML-% IV SOLN
INTRAVENOUS | Status: AC
  Administered 2020-01-13: 1 g via INTRAVENOUS
  Filled 2020-01-13: qty 100

## 2020-01-13 MED ORDER — LIDOCAINE HCL (PF) 1 % IJ SOLN
INTRAMUSCULAR | Status: AC
  Filled 2020-01-13: qty 30

## 2020-01-13 MED ORDER — BACITRACIN-NEOMYCIN-POLYMYXIN 400-5-5000 EX OINT
1.0000 "application " | TOPICAL_OINTMENT | Freq: Every day | CUTANEOUS | Status: AC
  Administered 2020-01-14 – 2020-01-19 (×5): 1 via TOPICAL
  Filled 2020-01-13 (×6): qty 1

## 2020-01-13 MED ORDER — GELATIN ABSORBABLE 12-7 MM EX MISC
CUTANEOUS | Status: AC
  Filled 2020-01-13: qty 1

## 2020-01-13 MED ORDER — CEFAZOLIN SODIUM-DEXTROSE 2-4 GM/100ML-% IV SOLN
2.0000 g | INTRAVENOUS | Status: DC
  Filled 2020-01-13: qty 100

## 2020-01-13 MED ORDER — MIDAZOLAM HCL 2 MG/2ML IJ SOLN
INTRAMUSCULAR | Status: AC | PRN
  Administered 2020-01-13 (×2): 0.5 mg via INTRAVENOUS

## 2020-01-13 MED ORDER — LIDOCAINE-EPINEPHRINE 1 %-1:100000 IJ SOLN
INTRAMUSCULAR | Status: AC | PRN
  Administered 2020-01-13: 20 mL
  Administered 2020-01-13: 10 mL

## 2020-01-13 MED ORDER — FENTANYL CITRATE (PF) 100 MCG/2ML IJ SOLN
INTRAMUSCULAR | Status: AC | PRN
  Administered 2020-01-13: 25 ug via INTRAVENOUS

## 2020-01-13 MED ORDER — GLUCAGON HCL RDNA (DIAGNOSTIC) 1 MG IJ SOLR
INTRAMUSCULAR | Status: AC
  Filled 2020-01-13: qty 1

## 2020-01-13 MED ORDER — HEPARIN SODIUM (PORCINE) 1000 UNIT/ML IJ SOLN
INTRAMUSCULAR | Status: AC
  Filled 2020-01-13: qty 1

## 2020-01-13 MED ORDER — MIDAZOLAM HCL 2 MG/2ML IJ SOLN
INTRAMUSCULAR | Status: AC
  Filled 2020-01-13: qty 2

## 2020-01-13 MED ORDER — DEXTROSE 10 % IV SOLN: INTRAVENOUS | Status: DC

## 2020-01-13 NOTE — Progress Notes (Signed)
Physical Therapy Treatment Patient Details Name: Krystal Little MRN: [id number] DOB: [birth year] Today's Date: 01/13/2020    History of Present Illness Pt is 64 y.o. female initially admitted for stroke. 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 Pt s/p R hemicraniectomy with bone flap placed in abdomen on 4/18.  PMH HTN, DM2, chronic a. Fib (coumadin) s/p pace maker.  She went to inpatient rehab and on May 29 readmitted to hospital in the setting of tachypnea and an enlarging left-sided pleural effusion. Thoracentesis was performed which showed a hemothorax.  A CT chest was then ordered showing obstruction of her left mainstem bronchus and a loculated left pleural effusion. She had bronchoscopy and L chest tube placed 5/30.  She was intubated 5/30-12/19/19. Pt reintubated 6/6 and underwent lt thoracotomy to drain hemothorax. Post op found to have craniotomy area fuller and CT showed  hemorrhage into the posterior R MCA distribution. Pt developed AKI and CRRT 6/11- 6/20. Intermittent HD initiated. Trach placed 6/22.     PT Comments    Patient progressing slowly towards PT goals. Alert, awake and participatory during session. Follows 1-2 step commands. Nods appropriately to questions asked regarding orientation. Continues to have left inattention and right gaze preference but able to attend to midline and to the left x2 with cues. Worked on cervical AROM, there ex and lateral trunk flexors sitting EOB. VSS on trach collar. Will continue to follow.   Follow Up Recommendations  LTACH     Equipment Recommendations  Other (comment) (TBD)    Recommendations for Other Services       Precautions / Restrictions Precautions Precautions: Fall Precaution Comments: R hemicraniectomy- bone flap in R abdomen, needs helmet for OOB,  Restrictions Weight Bearing Restrictions: No    Mobility  Bed Mobility Overal bed mobility: Needs Assistance Bed Mobility: Rolling;Sidelying to  Sit Rolling: Total assist Sidelying to sit: Total assist;HOB elevated     Sit to sidelying: Total assist;+2 for physical assistance General bed mobility comments: Assisted RLE in knee flexion with cues to push through right foot to roll and reach for rail with assist with RUE. Assist with LLE, trunk and to scoot bottom to EOB.  Transfers                 General transfer comment: Unable  Ambulation/Gait                 Stairs             Wheelchair Mobility    Modified Rankin (Stroke Patients Only) Modified Rankin (Stroke Patients Only) Pre-Morbid Rankin Score: No symptoms Modified Rankin: Severe disability     Balance Overall balance assessment: Needs assistance Sitting-balance support: Feet supported;Single extremity supported Sitting balance-Leahy Scale: Zero Sitting balance - Comments: Requires total A for sitting balance with RUE support; worked on cervical rotation in both directions and finding midline/left gaze. Worked on lateral flexors coming down onto elbow bilaterally x5.                                    Cognition Arousal/Alertness: Awake/alert Behavior During Therapy: Flat affect Overall Cognitive Status: Difficult to assess                                 General Comments: Able to follow 1-2 step commands without  difficulty; nods yes/no to questions appropriately. A&Ox3. Left inattention and right gaze preference. Lethargic at end of session.      Exercises General Exercises - Lower Extremity Long Arc Quad: AROM;15 reps;Seated;AAROM;Left;10 reps Long Arc Quad Limitations: Able to activate left quads with a flicker    General Comments General comments (skin integrity, edema, etc.): VSS with trach on RA. RR in 75s.      Pertinent Vitals/Pain Pain Assessment: Faces Faces Pain Scale: No hurt    Home Living                      Prior Function            PT Goals (current goals can now be  found in the care plan section) Progress towards PT goals: Progressing toward goals    Frequency    Min 2X/week      PT Plan Current plan remains appropriate    Co-evaluation              AM-PAC PT "6 Clicks" Mobility   Outcome Measure  Help needed turning from your back to your side while in a flat bed without using bedrails?: Total Help needed moving from lying on your back to sitting on the side of a flat bed without using bedrails?: Total Help needed moving to and from a bed to a chair (including a wheelchair)?: Total Help needed standing up from a chair using your arms (e.g., wheelchair or bedside chair)?: Total Help needed to walk in hospital room?: Total Help needed climbing 3-5 steps with a railing? : Total 6 Click Score: 6    End of Session Equipment Utilized During Treatment: Other (comment) (trach) Activity Tolerance: Patient tolerated treatment well Patient left: in bed;with call bell/phone within reach;with bed alarm set;with SCD's reapplied;Other (comment) (prevalon boots) Nurse Communication: Need for lift equipment PT Visit Diagnosis: Other abnormalities of gait and mobility (R26.89);Hemiplegia and hemiparesis Hemiplegia - Right/Left: Left Hemiplegia - dominant/non-dominant: Non-dominant Hemiplegia - caused by: Cerebral infarction;Nontraumatic intracerebral hemorrhage     Time: 2707-8675 PT Time Calculation (min) (ACUTE ONLY): 22 min  Charges:  $Therapeutic Activity: 8-22 mins                     Marisa Severin, PT, DPT Acute Rehabilitation Services Pager [phone] Office [place] 01/13/2020, 12:18 PM

## 2020-01-13 NOTE — Procedures (Signed)
Interventional Radiology Procedure Note  Procedure: Placement of a right IJ approach tunneled HD catheter, 19cm tip to cuff.  Tip is positioned at the superior cavoatrial junction and catheter is ready for immediate use.  Complications: None Recommendations:  - Ok to use - Do not submerge - Routine line care   Signed,  Dulcy Fanny. Earleen Newport, DO

## 2020-01-13 NOTE — Progress Notes (Signed)
Krystal Little, Krystal Little ([id number]) Visit Report for 09/09/2019 Arrival Information Details Patient Name: Date of Service: Krystal Little, Krystal Little 09/09/2019 4:00 PM Medical Record Number: [id number] Patient Account Number: [id number] Date of Birth/Sex: Treating RN: 09/05/1955 (64 y.o. [name] Primary Care Kapri Nero: Betsey Holiday, MA Enis Gash Other Clinician: Referring Roseland Braun: Treating Ike Maragh/Extender: Nemiah Commander NS, MA Gust Brooms in Treatment: 1 Visit Information History Since Last Visit Added or deleted any medications: No Patient Arrived: Ambulatory Any new allergies or adverse reactions: No Arrival Time: 16:50 Had a fall or experienced change in No Accompanied By: alone activities of daily living that may affect Transfer Assistance: None risk of falls: Patient Identification Verified: Yes Signs or symptoms of abuse/neglect since last visito No Secondary Verification Process Completed: Yes Hospitalized since last visit: No Patient Requires Transmission-Based Precautions: No Implantable device outside of the clinic excluding No Patient Has Alerts: Yes cellular tissue based products placed in the center Patient Alerts: Patient on Blood Thinner since last visit: Has Dressing in Place as Prescribed: Yes Has Compression in Place as Prescribed: Yes Pain Present Now: No Electronic Signature(s) Signed: 09/11/2019 8:56:01 AM By: Levan Hurst RN, BSN Entered By: Levan Hurst on 09/09/2019 16:53:02 -------------------------------------------------------------------------------- Compression Therapy Details Patient Name: Date of Service: Krystal Leyden NDRA B. 09/09/2019 4:00 PM Medical Record Number: [id number] Patient Account Number: [id number] Date of Birth/Sex: Treating RN: 1956/06/11 (64 y.o. [name] Primary Care Corryn Madewell: Betsey Holiday, MA Enis Gash Other Clinician: Referring Yitzel Shasteen: Treating Ahilyn Nell/Extender: Nemiah Commander NS, MA Gust Brooms in Treatment:  1 Compression Therapy Performed for Wound Assessment: Wound #3 Right,Medial Lower Leg Performed By: Clinician Carlene Coria, RN Compression Type: Three Layer Post Procedure Diagnosis Same as Pre-procedure Electronic Signature(s) Signed: 09/09/2019 6:06:02 PM By: Carlene Coria RN Entered By: Carlene Coria on 09/09/2019 17:14:05 -------------------------------------------------------------------------------- Encounter Discharge Information Details Patient Name: Date of Service: Krystal Leyden NDRA B. 09/09/2019 4:00 PM Medical Record Number: [id number] Patient Account Number: [id number] Date of Birth/Sex: Treating RN: Dec 11, 1955 (64 y.o. [name] Primary Care Deontray Hunnicutt: Betsey Holiday, MA Enis Gash Other Clinician: Referring Ireoluwa Gorsline: Treating Uzziah Rigg/Extender: Nemiah Commander NS, MA Gust Brooms in Treatment: 1 Encounter Discharge Information Items Post Procedure Vitals Discharge Condition: Stable Temperature (F): 98.1 Ambulatory Status: Ambulatory Pulse (bpm): 76 Discharge Destination: Home Respiratory Rate (breaths/min): 16 Transportation: Private Auto Blood Pressure (mmHg): 158/79 Accompanied By: alone Schedule Follow-up Appointment: Yes Clinical Summary of Care: Patient Declined Electronic Signature(s) Signed: 09/11/2019 8:56:01 AM By: Levan Hurst RN, BSN Entered By: Levan Hurst on 09/09/2019 17:29:49 -------------------------------------------------------------------------------- Lower Extremity Assessment Details Patient Name: Date of Service: Krystal Leyden NDRA B. 09/09/2019 4:00 PM Medical Record Number: [id number] Patient Account Number: [id number] Date of Birth/Sex: Treating RN: [birth year] (64 y.o. [name] Primary Care Eevee Borbon: Betsey Holiday, MA Enis Gash Other Clinician: Referring Keziyah Kneale: Treating Zadrian Mccauley/Extender: Nemiah Commander NS, MA Gust Brooms in Treatment: 1 Edema Assessment Assessed: [Left: No] [Right: No] Edema: [Left: No] [Right:  No] Calf Left: Right: Point of Measurement: 26 cm From Medial Instep cm 29.6 cm Ankle Left: Right: Point of Measurement: 11 cm From Medial Instep cm 20 cm Vascular Assessment Pulses: Dorsalis Pedis Palpable: [Right:Yes] Electronic Signature(s) Signed: 09/11/2019 8:56:01 AM By: Levan Hurst RN, BSN Entered By: Levan Hurst on 09/09/2019 16:56:50 -------------------------------------------------------------------------------- Multi Wound Chart Details Patient Name: Date of Service: Krystal Leyden NDRA B. 09/09/2019 4:00 PM Medical Record Number: [id number] Patient Account Number: [id number] Date of Birth/Sex: Treating RN: [birth year] (64 y.o. [name] Primary Care Other [place]: [facility] NS, MA  Enis Gash Other Clinician: Referring Morrison Masser: Treating Jamarques Pinedo/Extender: Nemiah Commander NS, MA Gust Brooms in Treatment: 1 Vital Signs Height(in): 64 Pulse(bpm): 76 Weight(lbs): 140 Blood Pressure(mmHg): 158/79 Body Mass Index(BMI): 24 Temperature(F): 98.1 Respiratory Rate(breaths/min): 16 Photos: [3:No Photos Right Lower Leg - Medial] [N/A:N/A N/A] Wound Location: [3:Bump] [N/A:N/A] Wounding Event: [3:Venous Leg Ulcer] [N/A:N/A] Primary Etiology: [3:Arrhythmia, Deep Vein Thrombosis,] [N/A:N/A] Comorbid History: [3:Type II Diabetes 05/18/2019] [N/A:N/A] Date Acquired: [3:1] [N/A:N/A] Weeks of Treatment: [3:Open] [N/A:N/A] Wound Status: [3:Yes] [N/A:N/A] Clustered Wound: [3:3] [N/A:N/A] Clustered Quantity: [3:4.3x1.5x0.1] [N/A:N/A] Measurements L x W x D (cm) [3:5.066] [N/A:N/A] A (cm) : rea [3:0.507] [N/A:N/A] Volume (cm) : [3:38.60%] [N/A:N/A] % Reduction in A [3:rea: 38.50%] [N/A:N/A] % Reduction in Volume: [3:Full Thickness Without Exposed] [N/A:N/A] Classification: [3:Support Structures Medium] [N/A:N/A] Exudate A mount: [3:Serosanguineous] [N/A:N/A] Exudate Type: [3:red, brown] [N/A:N/A] Exudate Color: [3:Flat and Intact] [N/A:N/A] Wound Margin: [3:Medium (34-66%)]  [N/A:N/A] Granulation A mount: [3:Red, Pink] [N/A:N/A] Granulation Quality: [3:Medium (34-66%)] [N/A:N/A] Necrotic A mount: [3:Eschar, Adherent Slough] [N/A:N/A] Necrotic Tissue: [3:Fat Layer (Subcutaneous Tissue)] [N/A:N/A] Exposed Structures: [3:Exposed: Yes Fascia: No Tendon: No Muscle: No Joint: No Bone: No Small (1-33%)] [N/A:N/A] Epithelialization: [3:Debridement - Excisional] [N/A:N/A] Debridement: Pre-procedure Verification/Time Out 16:11 [N/A:N/A] Taken: [3:Other] [N/A:N/A] Pain Control: [3:Subcutaneous, Slough] [N/A:N/A] Tissue Debrided: [3:Skin/Subcutaneous Tissue] [N/A:N/A] Level: [3:6.45] [N/A:N/A] Debridement A (sq cm): [3:rea Curette] [N/A:N/A] Instrument: [3:Moderate] [N/A:N/A] Bleeding: [3:Pressure] [N/A:N/A] Hemostasis A chieved: [3:0] [N/A:N/A] Procedural Pain: [3:0] [N/A:N/A] Post Procedural Pain: [3:Procedure was tolerated well] [N/A:N/A] Debridement Treatment Response: [3:4.3x1.5x0.1] [N/A:N/A] Post Debridement Measurements L x W x D (cm) [3:0.507] [N/A:N/A] Post Debridement Volume: (cm) [3:Compression Therapy] [N/A:N/A] Procedures Performed: [3:Debridement] Treatment Notes Wound #3 (Right, Medial Lower Leg) 1. Cleanse With Soap and water 2. Periwound Care Moisturizing lotion TCA Ointment 3. Primary Dressing Applied Iodoflex 4. Secondary Dressing ABD Pad Dry Gauze 6. Support Layer Applied 3 layer compression wrap Notes netting Electronic Signature(s) Signed: 09/09/2019 6:00:34 PM By: Linton Ham MD Signed: 09/09/2019 6:06:02 PM By: Carlene Coria RN Entered By: Linton Ham on 09/09/2019 17:42:17 -------------------------------------------------------------------------------- Floyd Details Patient Name: Date of Service: Krystal Leyden NDRA B. 09/09/2019 4:00 PM Medical Record Number: [id number] Patient Account Number: [id number] Date of Birth/Sex: Treating RN: 05/27/1956 (64 y.o. [name] Primary Care Cherree Conerly: Betsey Holiday, MA Enis Gash Other Clinician: Referring Daemyn Gariepy: Treating Rankin Coolman/Extender: Nemiah Commander NS, MA Gust Brooms in Treatment: 1 Active Inactive Wound/Skin Impairment Nursing Diagnoses: Knowledge deficit related to ulceration/compromised skin integrity Goals: Patient/caregiver will verbalize understanding of skin care regimen Date Initiated: 09/02/2019 Target Resolution Date: 09/30/2019 Goal Status: Active Ulcer/skin breakdown will have a volume reduction of 30% by week 4 Date Initiated: 09/02/2019 Target Resolution Date: 09/30/2019 Goal Status: Active Interventions: Assess patient/caregiver ability to obtain necessary supplies Assess patient/caregiver ability to perform ulcer/skin care regimen upon admission and as needed Assess ulceration(s) every visit Notes: Electronic Signature(s) Signed: 09/09/2019 6:06:02 PM By: Carlene Coria RN Entered By: Carlene Coria on 09/09/2019 16:23:48 -------------------------------------------------------------------------------- Pain Assessment Details Patient Name: Date of Service: Krystal Leyden NDRA B. 09/09/2019 4:00 PM Medical Record Number: [id number] Patient Account Number: [id number] Date of Birth/Sex: Treating RN: 1956/05/21 (64 y.o. [name] Primary Care Gailene Youkhana: Betsey Holiday, MA Enis Gash Other Clinician: Referring Mateja Dier: Treating Keiera Strathman/Extender: Nemiah Commander NS, MA Gust Brooms in Treatment: 1 Active Problems Location of Pain Severity and Description of Pain Patient Has Paino No Site Locations Pain Management and Medication Current Pain Management: Electronic Signature(s) Signed: 09/11/2019 8:56:01 AM By: Levan Hurst RN, BSN Entered By: Donnal Debar,  Shatara on 09/09/2019 16:53:34 -------------------------------------------------------------------------------- Patient/Caregiver Education Details Patient Name: Date of Service: Krystal Little, Krystal Little 2/23/2021andnbsp4:00 PM Medical Record Number: [id number] Patient  Account Number: [id number] Date of Birth/Gender: Treating RN: 1956-02-01 (64 y.o. [name] Primary Care Physician: Betsey Holiday, MA Enis Gash Other Clinician: Referring Physician: Treating Physician/Extender: Charlena Cross, MA Gust Brooms in Treatment: 1 Education Assessment Education Provided To: Patient Education Topics Provided Wound/Skin Impairment: Methods: Explain/Verbal Responses: State content correctly Electronic Signature(s) Signed: 09/09/2019 6:06:02 PM By: Carlene Coria RN Entered By: Carlene Coria on 09/09/2019 16:24:04 -------------------------------------------------------------------------------- Wound Assessment Details Patient Name: Date of Service: Krystal Leyden NDRA B. 09/09/2019 4:00 PM Medical Record Number: [id number] Patient Account Number: [id number] Date of Birth/Sex: Treating RN: 03/11/1956 (64 y.o. [name] Primary Care Suellyn Meenan: Betsey Holiday, MA Enis Gash Other Clinician: Referring Tyshauna Finkbiner: Treating Berthold Glace/Extender: Nemiah Commander NS, MA Gust Brooms in Treatment: 1 Wound Status Wound Number: 3 Primary Etiology: Venous Leg Ulcer Wound Location: Right Lower Leg - Medial Wound Status: Open Wounding Event: Bump Comorbid History: Arrhythmia, Deep Vein Thrombosis, Type II Diabetes Date Acquired: 05/18/2019 Weeks Of Treatment: 1 Clustered Wound: Yes Photos Wound Measurements Length: (cm) 4.3 Width: (cm) 1.5 Depth: (cm) 0.1 Clustered Quantity: 3 Area: (cm) 5.066 Volume: (cm) 0.507 % Reduction in Area: 38.6% % Reduction in Volume: 38.5% Epithelialization: Small (1-33%) Tunneling: No Undermining: No Wound Description Classification: Full Thickness Without Exposed Support Structures Wound Margin: Flat and Intact Exudate Amount: Medium Exudate Type: Serosanguineous Exudate Color: red, brown Foul Odor After Cleansing: No Slough/Fibrino Yes Wound Bed Granulation Amount: Medium (34-66%) Exposed Structure Granulation Quality:  Red, Pink Fascia Exposed: No Necrotic Amount: Medium (34-66%) Fat Layer (Subcutaneous Tissue) Exposed: Yes Necrotic Quality: Eschar, Adherent Slough Tendon Exposed: No Muscle Exposed: No Joint Exposed: No Bone Exposed: No Electronic Signature(s) Signed: 09/10/2019 5:09:01 PM By: Mikeal Hawthorne EMT/HBOT Signed: 01/13/2020 5:26:37 PM By: Carlene Coria RN Entered By: Mikeal Hawthorne on 09/10/2019 15:05:01 -------------------------------------------------------------------------------- Vitals Details Patient Name: Date of Service: Krystal Leyden NDRA B. 09/09/2019 4:00 PM Medical Record Number: [id number] Patient Account Number: [id number] Date of Birth/Sex: Treating RN: Oct 30, 1955 (64 y.o. [name] Primary Care Savilla Turbyfill: Betsey Holiday, MA Enis Gash Other Clinician: Referring Krystiana Fornes: Treating Jancarlo Biermann/Extender: Nemiah Commander NS, MA Gust Brooms in Treatment: 1 Vital Signs Time Taken: 16:53 Temperature (F): 98.1 Height (in): 64 Pulse (bpm): 76 Weight (lbs): 140 Respiratory Rate (breaths/min): 16 Body Mass Index (BMI): 24 Blood Pressure (mmHg): 158/79 Reference Range: 80 - 120 mg / dl Electronic Signature(s) Signed: 09/11/2019 8:56:01 AM By: Levan Hurst RN, BSN Entered By: Levan Hurst on 09/09/2019 16:53:28

## 2020-01-13 NOTE — Progress Notes (Addendum)
[facility] DAILY ICU PROGRESS NOTE                   [address]            [place] [id number]          [phone]   23 Days Post-Op Procedure(s) (LRB): Video Assisted Thoracoscopy (Vats) left , Drainage of Hemothorax (Left) Video Bronchoscopy (N/A)  Total Length of Stay:  LOS: 30 days   Subjective: Opens eyes to my voice. Remains non-verbal.   Objective: Vital signs in last 24 hours: Temp:  [97.7 F (36.5 C)-99.4 F (37.4 C)] 99.4 F (37.4 C) (06/28 2300) Pulse Rate:  [59-102] 63 (06/29 0700) Cardiac Rhythm: Normal sinus rhythm (06/29 0400) Resp:  [20-37] 21 (06/29 0700) BP: (121-214)/(58-79) 165/68 (06/29 0700) SpO2:  [99 %-100 %] 100 % (06/29 0700) FiO2 (%):  [30 %-32 %] 30 % (06/29 0400) Weight:  [61.2 kg] 61.2 kg (06/29 0500)  Filed Weights   01/12/20 0645 01/12/20 1038 01/13/20 0500  Weight: 62.7 kg 61.2 kg 61.2 kg    Weight change: -1.5 kg      Intake/Output from previous day: 06/28 0701 - 06/29 0700 In: 1828 [I.V.:20; Blood:630; NG/GT:1070; IV Piggyback:108] Out: 2200 [Stool:700]  Intake/Output this shift: No intake/output data recorded.  Current Meds: Scheduled Meds: . amantadine  100 mg Per Tube BID  . amiodarone  200 mg Per Tube Daily  . aspirin  325 mg Per Tube Daily  . atorvastatin  40 mg Per Tube Daily  . chlorhexidine gluconate (MEDLINE KIT)  15 mL Mouth Rinse BID  . Chlorhexidine Gluconate Cloth  6 each Topical Q0600  . darbepoetin (ARANESP) injection - DIALYSIS  100 mcg Intravenous Q Fri-HD  . docusate  100 mg Per Tube BID  . feeding supplement (PRO-STAT SUGAR FREE 64)  30 mL Per Tube Daily  . Gerhardt's butt cream   Topical TID  . insulin aspart  0-20 Units Subcutaneous Q4H  . insulin glargine  5 Units Subcutaneous BID  . mouth rinse  15 mL Mouth Rinse 10 times per day  . multivitamin  1 tablet Oral QHS  . pantoprazole sodium  40 mg Per Tube QHS  . phenytoin (DILANTIN) IV  100 mg Intravenous Q8H  . polyethylene glycol   17 g Per Tube TID  . propofol  500 mg Intravenous Once  . senna-docusate  1 tablet Per Tube BID  . sodium chloride flush  10-40 mL Intracatheter Q12H   Continuous Infusions: . sodium chloride Stopped (12/30/19 2139)  . sodium chloride    . sodium chloride    .  ceFAZolin (ANCEF) IV    . feeding supplement (VITAL AF 1.2 CAL) 1,000 mL (01/12/20 1117)  . levETIRAcetam Stopped (01/12/20 2137)  . norepinephrine (LEVOPHED) Adult infusion Stopped (01/10/20 0936)   PRN Meds:.sodium chloride, sodium chloride, sodium chloride, acetaminophen, alteplase, bisacodyl, fentaNYL (SUBLIMAZE) injection, heparin, hydrALAZINE, lidocaine (PF), lidocaine-prilocaine, midazolam, ondansetron (ZOFRAN) IV, oxyCODONE, pentafluoroprop-tetrafluoroeth, senna-docusate, sodium chloride flush  General appearance: cooperative and non-verbal but does respond to voice with eye tracking Heart: regular rate and rhythm, S1, S2 normal, no murmur, click, rub or gallop Lungs: clear to auscultation bilaterally Abdomen: soft, non-tender; bowel sounds normal; no masses,  no organomegaly Extremities: extremities normal, atraumatic, no cyanosis or edema and still no movement on the left side Wound: clean and dry  Lab Results: CBC: Recent Labs    01/12/20 0611 01/13/20 0440  WBC 9.5 11.6*  HGB 6.9* 8.5*  HCT 23.1* 27.9*  PLT 210 225   BMET:  Recent Labs    01/12/20 0322 01/13/20 0440  NA 135 136  K 5.0 4.3  CL 96* 98  CO2 24 27  GLUCOSE 155* 55*  BUN 89* 45*  CREATININE 4.43* 2.68*  CALCIUM 8.0* 8.0*    CMET: Lab Results  Component Value Date   WBC 11.6 (H) 01/13/2020   HGB 8.5 (L) 01/13/2020   HCT 27.9 (L) 01/13/2020   PLT 225 01/13/2020   GLUCOSE 55 (L) 01/13/2020   CHOL 239 (H) 11/01/2019   TRIG 152 (H) 12/23/2019   HDL 69 11/01/2019   LDLDIRECT 107 (H) 10/02/2012   LDLCALC 158 (H) 11/01/2019   ALT 119 (H) 01/06/2020   AST 209 (H) 01/06/2020   NA 136 01/13/2020   K 4.3 01/13/2020   CL 98 01/13/2020    CREATININE 2.68 (H) 01/13/2020   BUN 45 (H) 01/13/2020   CO2 27 01/13/2020   TSH 1.414 03/19/2013   INR 1.4 (H) 01/06/2020   HGBA1C 9.7 (H) 11/01/2019      PT/INR: No results for input(s): LABPROT, INR in the last 72 hours. Radiology: DG CHEST PORT 1 VIEW  Result Date: 01/12/2020 CLINICAL DATA:  Pleural effusion history of AFib EXAM: PORTABLE CHEST 1 VIEW COMPARISON:  01/08/2020 FINDINGS: LEFT-sided PICC line and LEFT-sided Vas-Cath remain in place. The PICC line terminating in upper RIGHT atrium. Vas-Cath at the caval to atrial junction. Tracheostomy tube in the proximal trachea with similar appearance. Feeding tube courses through in off the field of the radiograph. Dual lead pacer device in situ. Cardiomediastinal contours are stable with pleural fluid and thickening along the LEFT chest and LEFT lower lobe airspace disease with partial obscuration of LEFT hemidiaphragm. Interstitial markings remain mildly increased. RIGHT lung is clear. LEFT-sided rib fractures are again noted. Interval removal of LEFT-sided chest tube since the previous exam. No visible pneumothorax. IMPRESSION: 1. Interval removal of LEFT-sided chest tube. No visible pneumothorax. 2. Persistent small LEFT pleural effusion and LEFT lower lobe airspace disease. Electronically Signed   By: Zetta Bills M.D.   On: 01/12/2020 13:22     Assessment/Plan: S/P Procedure(s) (LRB): Video Assisted Thoracoscopy (Vats) left , Drainage of Hemothorax (Left) Video Bronchoscopy (N/A)  1. CXR yesterday showed persistent small left pleural effusion but no visible pneumo. Removal of left-sided chest tube on 6/26. Tracheostomy placed on 6/22 2. AKI, oliguric ischemic ATN secondary to hemorrhagic shock requiring pressor support-HD per renal. 3. Will need PEG placement before transfer to LTAC 4. Neuro-moving right side with purpose (hand squeeze and right toes) Left side no movement.   Plan: Last chest tube has been removed. Will need  PEG placement this week before transfer to [facility]. Planning on [facility] transfer when ready.   Will sign off but be available if needed.     Elgie Collard   01/13/2020 7:41 AM   VATS incision well healed, CXR satisfactory Ok to remove remaining L VATS skin staples on  July -11-2019 patient examined and medical record reviewed,agree with above note. Tharon Aquas Trigt III 01/13/2020

## 2020-01-13 NOTE — Progress Notes (Signed)
Hypoglycemic Event  CBG: 41  Treatment: Hypoglycemic Protocol, 25g of d50 given per protocol Symptoms: UTA  Follow-up CBG: Time:1145 CBG Result: 148   Possible Reasons for Event: Tube Feeding Held for Procedure  Comments/MD notified: CCM-Marshall, DO    Tyris Eliot R Phillipe Clemon, RN

## 2020-01-13 NOTE — Progress Notes (Signed)
SLP Cancellation Note  Patient Details Name: Krystal Little MRN: [id number] DOB: 02-21-1956   Cancelled treatment:       Reason Eval/Treat Not Completed: Patient not medically ready. Currently ventilated. Was on ATC yesterday at some point. Will follow for readiness.    Octavia Mottola, Katherene Ponto 01/13/2020, 12:17 PM

## 2020-01-13 NOTE — Progress Notes (Signed)
[facility] Progress Note    Assessment/ Plan:    Acute kidney injury, oliguric ischemic ATN secondary to hemorrhagic shock requiring pressor support.  Refractory to IV diuretics initiated on CRRT 12/26/2019- 6/20.  First IHD 6/21. IHD  6/23, 6/25, no signs of renal recovery and not much output with Lasix challenge 6/24.   - HD per MWF schedule  - Will follow daily labs - Please note that she is a very poor long term candidate for hemodialysis - Note tunneled dialysis catheter placement needed, IR has seen, appreciate assistance; they are coordinating PEG and tunneled HD catheter - Strict ins/outs - please transition from vital to nepro per RD   L sided hemothorax: s/p VATS with thoracic surgery 6/6.  S/p removal of one chest tube 6/21.     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  Had new ICH 6/6.    Acute hypoxic RF: per PCCM.  S/p trach 6/22, wean as tolerated    Chronic atrial fibrillation: on amiodarone and asa    Seizures: anti-epileptics per neuro    Nutrition: will need PEG - IR to place   Anemia- s/p multiple pRBCs including one on 6/28 - continue to transfuse as needed.  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.   Subjective:    Spoke with IR yesterday and they are able to coordinate PEG placement and placement of tunneled dialysis catheter.  Family is talking with CM about LTAC per charting.  No urine output is charted.  Last HD on 6/28 with 1.5 liters UF charted.  Had PRBC's on 6/28  Review of systems: unable to obtain secondary to trach/pt status    Objective:   BP (!) 165/64   Pulse 60   Temp 99.4 F (37.4 C) (Oral)   Resp (!) 21   Ht '5\' 4"'  (1.626 m)   Wt 61.2 kg   SpO2 100%   BMI 23.16 kg/m    Intake/Output Summary (Last 24 hours) at 01/13/2020 0543 Last data filed at 01/13/2020 0400 Gross per 24 hour  Intake 1948 ml  Output 1800 ml  Net 148 ml   Weight change: -1.5 kg  Physical Exam:  Gen: lying in bed in NAD HEENT: R hemicraniectomy, trach in place CVS: RRR Resp: clear bilaterally anteriorly  Abd: soft non distended Ext: no pitting LE edema; 2+ edema LUE  Neuro - she does show her right thumb to command twice; doesn't replicate with left; eyes open on arrival  ACCESS: L IJ nontunneled HD cath  Imaging: DG CHEST PORT 1 VIEW  Result Date: 01/12/2020 CLINICAL DATA:  Pleural effusion history of AFib EXAM: PORTABLE CHEST 1 VIEW COMPARISON:  01/08/2020 FINDINGS: LEFT-sided PICC line and LEFT-sided Vas-Cath remain in place. The PICC line terminating in upper RIGHT atrium. Vas-Cath at the caval to atrial junction. Tracheostomy tube in the proximal trachea with similar appearance. Feeding tube courses through in off the field of the radiograph. Dual lead pacer device in situ. Cardiomediastinal contours are stable with pleural fluid and thickening along the LEFT chest and LEFT lower lobe airspace disease with partial obscuration of LEFT hemidiaphragm. Interstitial markings remain mildly increased. RIGHT lung is clear. LEFT-sided rib fractures are again noted. Interval removal of LEFT-sided chest tube since the previous exam.  No visible pneumothorax. IMPRESSION: 1. Interval removal of LEFT-sided chest tube. No visible pneumothorax. 2. Persistent small LEFT pleural effusion and LEFT lower lobe airspace disease. Electronically Signed   By: Zetta Bills M.D.   On: 01/12/2020 13:22    Labs: BMET Recent Labs  Lab 01/06/20 0824 01/07/20 0419 01/08/20 0405 01/09/20 0443 01/10/20 0340 01/11/20 0330 01/12/20 0322  NA 134* 133* 135 136 135 134* 135  K 3.6 4.2 4.1 5.1 4.2 4.4 5.0  CL 96* 95* 97* 97* 95* 95* 96*  CO2 '27 24 27 22 27 26 24  ' GLUCOSE 154* 154* 145* 109* 162* 141* 155*   BUN 33* 57* 36* 65* 39* 65* 89*  CREATININE 2.23* 3.24* 2.47* 3.86* 2.47* 3.71* 4.43*  CALCIUM 7.4* 7.5* 7.6* 7.8* 7.6* 7.7* 8.0*  PHOS  --  4.9* 4.0 6.7* 4.7* 4.8* 5.5*   CBC Recent Labs  Lab 01/08/20 0412 01/11/20 0330 01/12/20 0611 01/13/20 0440  WBC 8.0 10.1 9.5 11.6*  HGB 7.2* 7.2* 6.9* 8.5*  HCT 23.6* 23.8* 23.1* 27.9*  MCV 100.0 100.4* 100.4* 97.2  PLT 109* 198 210 225    Medications:    . amantadine  100 mg Per Tube BID  . amiodarone  200 mg Per Tube Daily  . aspirin  325 mg Per Tube Daily  . atorvastatin  40 mg Per Tube Daily  . chlorhexidine gluconate (MEDLINE KIT)  15 mL Mouth Rinse BID  . Chlorhexidine Gluconate Cloth  6 each Topical Q0600  . darbepoetin (ARANESP) injection - DIALYSIS  100 mcg Intravenous Q Fri-HD  . docusate  100 mg Per Tube BID  . feeding supplement (PRO-STAT SUGAR FREE 64)  30 mL Per Tube Daily  . Gerhardt's butt cream   Topical TID  . insulin aspart  0-20 Units Subcutaneous Q4H  . insulin glargine  5 Units Subcutaneous BID  . mouth rinse  15 mL Mouth Rinse 10 times per day  . multivitamin  1 tablet Oral QHS  . pantoprazole sodium  40 mg Per Tube QHS  . phenytoin (DILANTIN) IV  100 mg Intravenous Q8H  . polyethylene glycol  17 g Per Tube TID  . propofol  500 mg Intravenous Once  . senna-docusate  1 tablet Per Tube BID  . sodium chloride flush  10-40 mL Intracatheter Q12H     Claudia Desanctis, MD 01/13/2020, 5:55 AM

## 2020-01-13 NOTE — Procedures (Signed)
Interventional Radiology Procedure Note  Procedure: Placement of percutaneous 20F pull-through gastrostomy tube. Complications: None Recommendations: - NPO except for sips and chips remainder of today and overnight - Maintain G-tube to LWS until tomorrow morning  - May advance diet as tolerated and begin using tube tomorrow morning  Signed,   Eufemio Strahm S. Shane Badeaux, DO   

## 2020-01-13 NOTE — Progress Notes (Signed)
NAME:  Krystal Little, MRN:  [id number], DOB:  May 11, 1956, LOS: 43 ADMISSION DATE:  11/30/2019, CONSULTATION DATE:  5/29 REFERRING MD:  Letta Pate, CHIEF COMPLAINT:  Chest congestion   Brief History   31 female with an extensive cardiac history was admitted to [facility] in April in the setting of a right MCA stroke, had a right interventional radiology guided thrombectomy, later had significant brain edema and underwent craniectomy, ultimately transferred to inpatient rehab.  Pulmonary and critical care medicine was consulted on May 29 in the setting of tachypnea and an enlarging left-sided pleural effusion. Thoracentesis was performed which showed a hemothorax.  A CT chest was then ordered showing obstruction of her left mainstem bronchus and a loculated left pleural effusion.  On May 30 she was moved to the ICU for chest tube placement and bronchoscopy.  Underwent intrapleural TPA but required VATS eventually, course further complicated by ICH , seizures and AKI requiring CRRT  Past Medical History  Status post mitral valve repair 2001 Status post cardiac pacemaker 2001 History of sick sinus syndrome Hypertension Diabetes mellitus type 2 Chronic atrial fibrillation Right MCA stroke 2021  Significant Hospital Events   4/16 IR thrombectomy R MCA stroke 4/25 PCCM sign off 5/6 transfer to [facility] 5/29 PCCM consult left pleural effusion, thoracentesis 5/30 worsening tachypnea. Move to ICU for intubation to facilitate bronchoscopy and chest tube placement 5/31 TPA/ pulmonzyme started for 3 days for persistent left effusion 6/01 Afebrile, low-dose Levophed, Precedex, Minimal output on chest tube- pulmozyme / tpa x 2,  6/02 Weaned x 7 hours yesterday PSV 15/5, pulmozyme and TPA completed 3 days 6/03 afib with RVR placed on amio gtt overnight , 1U PRBC for Hb 7.1  6/05 extubated successfully, Repeat TPA/Pulmonzyme given again  6/06 acute decompensation overnight,  hemorrhagic shock > to OR for left VATS with 2L hemothorax drained.  CT head with new ICH> neurosurgery consulted 6/09 More awake but agitated on PSV weaning  6/12: Seizures + on EEG   Consults:  PCCM  Procedures:  5/29 Thoracentesis >> 1.5 L bloody fluid 5/30 ETT >  5/30 L pigtail chest tube >  Bronchoscopy 5/30 >>  large, thick, grey mucus plug completely occluding the left mainstem bronchus.  [facility] HD cath 6/12 >  Significant Diagnostic Tests:  Thoracentesis left chest 5/29 >> 8K WBC, 78% WBC, protein 4.0, LDH 182 CT chest abdomen pelvis 6/6 >> large left hemithorax, patchy right lung opacities with small right effusion.  No acute abdominal process CT head 6/6 >> evolution of right MCA infarct with hemorrhagic transformation with large intraparenchymal hematoma, 2 additional small hemorrhages in the inferior cerebellum and left temporal lobe.  Trace subarachnoid blood. CT Head 6/10 >> stable hemorrhagic R MCA.  Right hemi-craniectomy with no midline shift or mass effect.  Mildly regressed small cerebellar hemorrhages.  Stable small volume intraventricular hemorrhage, no ventriculomegaly.  Trace subarachnoid hemorrhage largely resolved.  6/12 EEG: right frontocentral seizures X2   Micro Data:   RVP 4/16 neg  MRSA PCR  4/17 neg   ======== 5/29 Left pleural fluid culture > negative 5/29 left pleural fluid cytology >> No malignant cells identified. Blood and acute inflammation.  5/30 BAL >> rare candida albicans 6/6 L Pleural Fluid >> negative 6/6 BCx2 >> negative   Antimicrobials:  Cefepime 4/24 >> 4/25 Zosyn 4/28 >> 5/4 Zosyn 5/30 >> 6/5 Cefepime 6/6 >> 6/12  Interim history/subjective:  6/29:  Pt awake and following commands. Denies pain. Strong RU  and RLE. Flaccid L. BS low this am, npo for peg and tunneled catheter this am.  6/28: pt drowsy on ihd this am. Denies pain. Moves RUE.   6/27:More awake. Denies pain.  Objective   Blood pressure (!) 165/68, pulse 63,  temperature 99.4 F (37.4 C), temperature source Oral, resp. rate (!) 21, height _0  (1.626 m), weight 61.2 kg, SpO2 100 %.    Vent Mode: PRVC FiO2 (%):  [30 %-32 %] 30 % Set Rate:  [20 bmp] 20 bmp Vt Set:  [430 mL] 430 mL PEEP:  [5 cmH20] 5 cmH20 Plateau Pressure:  [21 cmH20] 21 cmH20   Intake/Output Summary (Last 24 hours) at 01/13/2020 0801 Last data filed at 01/13/2020 0600 Gross per 24 hour  Intake 1728 ml  Output 2200 ml  Net -472 ml   Filed Weights   01/12/20 0645 01/12/20 1038 01/13/20 0500  Weight: 62.7 kg 61.2 kg 61.2 kg    Examination:  GEN: NAD, resting comfortably on dialysis HEENT:trach in place, minimal secretions CV: RRR, ext warm PULM: clear, no accessory muscle use GI: Soft, +BS, rectal tube in place EXT: Minimal anasarca, L edema NEURO: Moves R side to command, L hemiparesis PSYCH: RASS 0 SKIN: No rashes  Lab Results  Component Value Date   WBC 11.6 (H) 01/13/2020   HGB 8.5 (L) 01/13/2020   HCT 27.9 (L) 01/13/2020   MCV 97.2 01/13/2020   PLT 225 01/13/2020   Lab Results  Component Value Date   CREATININE 2.68 (H) 01/13/2020   BUN 45 (H) 01/13/2020   NA 136 01/13/2020   K 4.3 01/13/2020   CL 98 01/13/2020   CO2 27 01/13/2020    Assessment & Plan:   Acute Respiratory Failure with Hypoxemia- trach 6/22 Mucus Plugging, Suspected Aspiration   Left loculated effusion complicated by hemothorax post VATS decortications: - on 4L via trach at this time.  - VAP prevention bundle Macrocytic anemia:  -b12 :2230 and folate: 10.4 -s/p 1U PRBC -no overt bleeding noted -cxr without significant effusion, personally reviewed by me  AKI secondary to shock - no evidence of obstruction on renal US. 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   -No neurosurgical intervention -Supportive  care -PEG today concurrent with tunneled HD catheter  Status Epilepticus, likely related to underlying stroke history :EEG off 6/14 -Keppra and Dilantin per neuro, as needed Ativan for seizure-like activity  DM2 with Hyperglycemia HYPOglycemia event this am.  -CBG, SSI plus Lantus 5 q 12 -hold insulin while NPO.  -give amp d50 this am, bs ~30 -will start d10 if cont to remain low while NPO (to minimize volume with d5 in dialysis pt)  Chronic Atrial Fibrillation s/p Pacemaker -Telemetry -Full dose aspirin and subQ heparin will have to do for foreseeable future - Oral amiodarone  Moderate Protein Calorie Malnutrition  -Continue tube feeding once tunneled line in  Goals of Care -Full code, should be stable for LTACH vs. SNF depending on if we can wean from vent  Best practice:  Diet: TF  Pain/Anxiety/Delirium protocol (if indicated): fent prn VAP protocol (if indicated): yes DVT prophylaxis: Heparin subQ GI prophylaxis: PPI Glucose control: see above Mobility: BR Code Status: full code Family Communication: updated 6/29 Disposition:  Awaiting LTACH bed, some issues on family preference   Critical care time: The patient is critically ill with multiple  organ systems failure and requires high complexity decision making for assessment and support, frequent evaluation and titration of therapies, application of advanced monitoring technologies and extensive interpretation of multiple databases.  Critical care time 37 mins. This represents my time independent of the NPs time taking care of the pt. This is excluding procedures.    [facility] 01/13/2020, 8:01 AM

## 2020-01-14 LAB — RENAL FUNCTION PANEL
Albumin: 1.4 g/dL — ABNORMAL LOW (ref 3.5–5.0)
Anion gap: 13 (ref 5–15)
BUN: 50 mg/dL — ABNORMAL HIGH (ref 8–23)
CO2: 25 mmol/L (ref 22–32)
Calcium: 8 mg/dL — ABNORMAL LOW (ref 8.9–10.3)
Chloride: 95 mmol/L — ABNORMAL LOW (ref 98–111)
Creatinine, Ser: 3.51 mg/dL — ABNORMAL HIGH (ref 0.44–1.00)
GFR calc Af Amer: 15 mL/min — ABNORMAL LOW (ref >60)
GFR calc non Af Amer: 13 mL/min — ABNORMAL LOW (ref >60)
Glucose, Bld: 117 mg/dL — ABNORMAL HIGH (ref 70–99)
Phosphorus: 5.1 mg/dL — ABNORMAL HIGH (ref 2.5–4.6)
Potassium: 4.9 mmol/L (ref 3.5–5.1)
Sodium: 133 mmol/L — ABNORMAL LOW (ref 135–145)

## 2020-01-14 LAB — CBC
HCT: 28.6 % — ABNORMAL LOW (ref 36.0–46.0)
Hemoglobin: 8.9 g/dL — ABNORMAL LOW (ref 12.0–15.0)
MCH: 30.4 pg (ref 26.0–34.0)
MCHC: 31.1 g/dL (ref 30.0–36.0)
MCV: 97.6 fL (ref 80.0–100.0)
Platelets: 232 10*3/uL (ref 150–400)
RBC: 2.93 MIL/uL — ABNORMAL LOW (ref 3.87–5.11)
RDW: 20.9 % — ABNORMAL HIGH (ref 11.5–15.5)
WBC: 12.9 10*3/uL — ABNORMAL HIGH (ref 4.0–10.5)
nRBC: 0 % (ref 0.0–0.2)

## 2020-01-14 LAB — GLUCOSE, CAPILLARY
Glucose-Capillary: 104 mg/dL — ABNORMAL HIGH (ref 70–99)
Glucose-Capillary: 104 mg/dL — ABNORMAL HIGH (ref 70–99)
Glucose-Capillary: 105 mg/dL — ABNORMAL HIGH (ref 70–99)
Glucose-Capillary: 149 mg/dL — ABNORMAL HIGH (ref 70–99)
Glucose-Capillary: 170 mg/dL — ABNORMAL HIGH (ref 70–99)
Glucose-Capillary: 92 mg/dL (ref 70–99)

## 2020-01-14 MED ORDER — NEPRO/CARBSTEADY PO LIQD
1000.0000 mL | ORAL | Status: DC
  Administered 2020-01-14 – 2020-01-20 (×6): 1000 mL

## 2020-01-14 MED ORDER — RENA-VITE PO TABS
1.0000 | ORAL_TABLET | Freq: Every day | ORAL | Status: DC
  Administered 2020-01-14 – 2020-01-31 (×18): 1
  Filled 2020-01-14 (×18): qty 1

## 2020-01-14 NOTE — Progress Notes (Signed)
NAME:  JONATHAN KIRKENDOLL, MRN:  [id number], DOB:  1956/04/10, LOS: 34 ADMISSION DATE:  12/03/2019, CONSULTATION DATE:  5/29 REFERRING MD:  Letta Pate, CHIEF COMPLAINT:  Chest congestion   Brief History   68 female with an extensive cardiac history was admitted to [facility] in April in the setting of a right MCA stroke, had a right interventional radiology guided thrombectomy, later had significant brain edema and underwent craniectomy, ultimately transferred to inpatient rehab.  Pulmonary and critical care medicine was consulted on May 29 in the setting of tachypnea and an enlarging left-sided pleural effusion. Thoracentesis was performed which showed a hemothorax.  A CT chest was then ordered showing obstruction of her left mainstem bronchus and a loculated left pleural effusion.  On May 30 she was moved to the ICU for chest tube placement and bronchoscopy.  Underwent intrapleural TPA but required VATS eventually, course further complicated by ICH , seizures and AKI requiring CRRT  Past Medical History  Status post mitral valve repair 2001 Status post cardiac pacemaker 2001 History of sick sinus syndrome Hypertension Diabetes mellitus type 2 Chronic atrial fibrillation Right MCA stroke 2021  Significant Hospital Events   4/16 IR thrombectomy R MCA stroke 4/25 PCCM sign off 5/6 transfer to [facility] 5/29 PCCM consult left pleural effusion, thoracentesis 5/30 worsening tachypnea. Move to ICU for intubation to facilitate bronchoscopy and chest tube placement 5/31 TPA/ pulmonzyme started for 3 days for persistent left effusion 6/01 Afebrile, low-dose Levophed, Precedex, Minimal output on chest tube- pulmozyme / tpa x 2,  6/02 Weaned x 7 hours yesterday PSV 15/5, pulmozyme and TPA completed 3 days 6/03 afib with RVR placed on amio gtt overnight , 1U PRBC for Hb 7.1  6/05 extubated successfully, Repeat TPA/Pulmonzyme given again  6/06 acute decompensation overnight,  hemorrhagic shock > to OR for left VATS with 2L hemothorax drained.  CT head with new ICH> neurosurgery consulted 6/09 More awake but agitated on PSV weaning  6/12: Seizures + on EEG   Consults:  PCCM  Procedures:  5/29 Thoracentesis >> 1.5 L bloody fluid 5/30 ETT >  5/30 L pigtail chest tube >  Bronchoscopy 5/30 >>  large, thick, grey mucus plug completely occluding the left mainstem bronchus.  [facility] HD cath 6/12 >6/29 6/29 g tube and tunneled vascular catheter placed  Significant Diagnostic Tests:  Thoracentesis left chest 5/29 >> 8K WBC, 78% WBC, protein 4.0, LDH 182 CT chest abdomen pelvis 6/6 >> large left hemithorax, patchy right lung opacities with small right effusion.  No acute abdominal process CT head 6/6 >> evolution of right MCA infarct with hemorrhagic transformation with large intraparenchymal hematoma, 2 additional small hemorrhages in the inferior cerebellum and left temporal lobe.  Trace subarachnoid blood. CT Head 6/10 >> stable hemorrhagic R MCA.  Right hemi-craniectomy with no midline shift or mass effect.  Mildly regressed small cerebellar hemorrhages.  Stable small volume intraventricular hemorrhage, no ventriculomegaly.  Trace subarachnoid hemorrhage largely resolved.  6/12 EEG: right frontocentral seizures X2   Micro Data:   RVP 4/16 neg  MRSA PCR  4/17 neg   ======== 5/29 Left pleural fluid culture > negative 5/29 left pleural fluid cytology >> No malignant cells identified. Blood and acute inflammation.  5/30 BAL >> rare candida albicans 6/6 L Pleural Fluid >> negative 6/6 BCx2 >> negative   Antimicrobials:  Cefepime 4/24 >> 4/25 Zosyn 4/28 >> 5/4 Zosyn 5/30 >> 6/5 Cefepime 6/6 >> 6/12  Interim history/subjective:  6/30:  S/p  g tube and tunneled vascular catheter placement yesterday. Will ask dietary to change tube feeds to nepro per nephrology recs. Hypoglycemic events yesterday 2/2 npo status prompted initiation of d10 that we can stop as tf back  on. Also d/c'd her lantus which may need to be reinitiated but will follow bs first. Relatively significant secretions from trach this am. Will send for cx.  6/29:  Pt awake and following commands. Denies pain. Strong RU and RLE. Flaccid L. BS low this am, npo for peg and tunneled catheter this am.  6/28: pt drowsy on ihd this am. Denies pain. Moves RUE.   6/27:More awake. Denies pain.  Objective   Blood pressure (!) 116/51, pulse 69, temperature 99.9 F (37.7 C), temperature source Oral, resp. rate (!) 23, height _0  (1.626 m), weight 62.1 kg, SpO2 99 %.    Vent Mode: PRVC FiO2 (%):  [30 %-40 %] 30 % Set Rate:  [20 bmp] 20 bmp Vt Set:  [430 mL] 430 mL PEEP:  [5 cmH20] 5 cmH20 Plateau Pressure:  [15 cmH20-18 cmH20] 18 cmH20   Intake/Output Summary (Last 24 hours) at 01/14/2020 7619 Last data filed at 01/14/2020 0600 Gross per 24 hour  Intake 595.69 ml  Output --  Net 595.69 ml   Filed Weights   01/12/20 1038 01/13/20 0500 01/14/20 0500  Weight: 61.2 kg 61.2 kg 62.1 kg    Examination:  GEN: NAD, resting comfortably on dialysis HEENT:trach in place, significant tan secretions from trach CV: RRR, ext warm PULM: clear, no accessory muscle use GI: Soft, +BS, rectal tube in place EXT:  L UE edema NEURO: Moves R side to command, L hemiparesis PSYCH: RASS 0 SKIN: No rashes  Lab Results  Component Value Date   WBC 12.9 (H) 01/14/2020   HGB 8.9 (L) 01/14/2020   HCT 28.6 (L) 01/14/2020   MCV 97.6 01/14/2020   PLT 232 01/14/2020   Lab Results  Component Value Date   CREATININE 3.51 (H) 01/14/2020   BUN 50 (H) 01/14/2020   NA 133 (L) 01/14/2020   K 4.9 01/14/2020   CL 95 (L) 01/14/2020   CO2 25 01/14/2020    Assessment & Plan:   Acute Respiratory Failure with Hypoxemia- trach 6/22 Mucus Plugging, Suspected Aspiration   Left loculated effusion complicated by hemothorax post VATS decortications: - on atc  -vent at night, but will attempt to extend this evening for 24  hours - VAP prevention bundle -send resp cx Macrocytic anemia:  -b12 :2230 and folate: 10.4 -s/p 1U PRBC -no overt bleeding noted -cxr without significant effusion, personally reviewed by me  AKI secondary to shock - no evidence of obstruction on renal US. 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   -No neurosurgical intervention -Supportive care -s/p PEG and tunneled HD catheter  Status Epilepticus, likely related to underlying stroke history :EEG off 6/14 -Keppra and Dilantin per neuro, as needed Ativan for seizure-like activity  DM2 with Hyperglycemia HYPOglycemia event this am.  -CBG, SSI  -hold insulin while NPO.  -will start d10 if cont to remain low while NPO (to minimize volume with d5 in dialysis pt)  Chronic Atrial Fibrillation s/p Pacemaker -Telemetry -Full dose aspirin and subQ heparin will have to do for foreseeable future - Oral amiodarone  Moderate Protein Calorie Malnutrition  -Continue tube feeding but have requested dietary to change to  nepro  Goals of Care -Full code, should be stable for LTACH vs. SNF depending on if we can wean from vent  Best practice:  Diet: TF to resume Pain/Anxiety/Delirium protocol (if indicated): fent prn VAP protocol (if indicated): yes DVT prophylaxis: Heparin subQ GI prophylaxis: PPI Glucose control: see above Mobility: BR Code Status: full code Family Communication: updated 6/29 Disposition:  Awaiting LTACH bed, some issues on family preference   Critical care time: The patient is critically ill with multiple organ systems failure and requires high complexity decision making for assessment and support, frequent evaluation and titration of therapies, application of advanced monitoring technologies and extensive interpretation of multiple databases.  Critical care  time 35 mins. This represents my time independent of the NPs time taking care of the pt. This is excluding procedures.    Audria Nine DO [facility] 01/14/2020, 8:21 AM

## 2020-01-14 NOTE — Progress Notes (Signed)
Nutrition Follow-up  DOCUMENTATION CODES:   Not applicable  INTERVENTION:   Tube Feeding:  Nepro at 45 ml/hr Pro-Stat 30 mL daily Provides 2044 kcals, 103 g of protein, 788 mL of free water  Consider transitioning to bolus feeds if tolerating change in formula via new PEG tube  Continue Rena-Vite daily   NUTRITION DIAGNOSIS:   Inadequate oral intake related to acute illness as evidenced by NPO status.  Being addressed via TF   GOAL:   Patient will meet greater than or equal to 90% of their needs  Progressing  MONITOR:   Vent status, TF tolerance, Labs, Weight trends  REASON FOR ASSESSMENT:   Consult, Ventilator Enteral/tube feeding initiation and management  ASSESSMENT:   64 yo female admitted from [place] after being at [facility] in April in setting of R. MCA stroke requiring thrombectomy, developed significant brain edema requiring craniectomy. Pt developed L pleural effusion on Rehab requiring thoracentesis and ultimately required intubation due to resp failure from occlusion of L. Mainstem bronchus and large L hemothorax, bronch and chest tube placement. Pt did require Cortrak placement with TF initially but was taking po prior to readmission from Rehab. PMH DM, HTN, R. MCA stroke, SSS  5/29 Thoracentesis 5/30 Intubated, Chest tube 6/04Cortrak placed 6/06 Left VATS with drainage of left hemothorax 6/11 CRRT initiated 6/12 Seizures+ on EEG 6/20 CRRT stopped 6/21 First iHD 6/22 Trach placement  6/29 20 Fr. PEG tube placed , RIJ tunneled HD cath placed  Pt discussed during ICU rounds, Noted placement pending  Pt receiving iHD this AM, no signs of renal recovery, Last iHD on 6/28 with net UF 1.5 L  Tolerating ATC during the day, vent at night; planning to trial trach collar at night  Plan for initiation of TF via new PEG tube today. Noted SLP following D10 started for hypoglycemia, should resolve with re-initiation of TF  Estimating dry weight around 62  kg  Labs: sodium 133 (L), Creatinine 3.51, BUN 50 Meds: Rena-Vite   Diet Order:   Diet Order            Diet NPO time specified  Diet effective now                 EDUCATION NEEDS:   Not appropriate for education at this time  Skin:  Skin Assessment: Skin Integrity Issues: Skin Integrity Issues:: Incisions, Other (Comment) Incisions: incision to groin, head, abdomen Other: fissure to mid coccyx, venous stasis ulcers on legs, MASD to buttock/sacrum  Last BM:  6/30 rectal tube  Height:   Ht Readings from Last 1 Encounters:  12/02/2019 5\' 4"  (1.626 m)    Weight:   Wt Readings from Last 1 Encounters:  01/14/20 62 kg    BMI:  Body mass index is 23.46 kg/m.  Estimated Nutritional Needs:   Kcal:  1860-2050  Protein:  90-120 g  Fluid:  1000 mL plus UOP   Kerman Passey MS, RDN, LDN, CNSC Registered Dietitian III RD Pager Number and RD On-Call Pager Number Located in [place]

## 2020-01-14 NOTE — Progress Notes (Signed)
Updated pt's husband via phone.

## 2020-01-14 NOTE — Evaluation (Signed)
Occupational Therapy Evaluation Patient Details Name: Krystal Little MRN: [id number] DOB: 09-09-1955 Today's Date: 01/14/2020    History of Present Illness Pt is 64 y.o. female initially admitted for stroke. 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 Pt s/p R hemicraniectomy with bone flap placed in abdomen on 4/18.  PMH HTN, DM2, chronic a. Fib (coumadin) s/p pace maker.  She went to inpatient rehab and on May 29 readmitted to hospital in the setting of tachypnea and an enlarging left-sided pleural effusion. Thoracentesis was performed which showed a hemothorax.  A CT chest was then ordered showing obstruction of her left mainstem bronchus and a loculated left pleural effusion. She had bronchoscopy and L chest tube placed 5/30.  She was intubated 5/30-12/19/19. Pt reintubated 6/6 and underwent lt thoracotomy to drain hemothorax. Post op found to have craniotomy area fuller and CT showed  hemorrhage into the posterior R MCA distribution. Pt developed AKI and CRRT 6/11- 6/20. Intermittent HD initiated. Trach placed 6/22.    Clinical Impression   This 64 y/o female presents with the above. Pt previously in [place] prior to this admission. Prior to previous admit/dc to CIR pt was independent with ADL and mobility. Pt lethargic this session, but does arouse and nod head yes/no appropriately to therapist questions, overall following simple commands. Pt with notable weakness, more predominantly in L side vs R, impaired vision and overall mobility status. Pt tolerating gentle AAROM/PROM to bil UE/LEs. She was able to perform simple grooming ADL (face washing) with max hand over hand assist using RUE, overall requiring totalA for ADL tasks. Pt's spouse present and supportive during session, playing pt's favorite music to increase engagement during activity completion. Pt on trach collar with SpO2 >90% throughout, BP start of session 142/54, end of session 128/45, HR 68. Pt to benefit from  continued acute OT services and recommend post acute rehab services to maximize her overall safety and independence with ADL and mobility as well as to decrease level of caregiver burden.     Follow Up Recommendations  LTACH    Equipment Recommendations  Other (comment) (TBD)           Precautions / Restrictions Precautions Precautions: Fall Precaution Comments: R hemicraniectomy- bone flap in R abdomen, needs helmet for OOB,  Restrictions Weight Bearing Restrictions: No      Mobility    Transfers                 General transfer comment: deferred     Balance                                           ADL either performed or assessed with clinical judgement   ADL Overall ADL's : Needs assistance/impaired Eating/Feeding: NPO   Grooming: Maximal assistance;Wash/dry face;Bed level Grooming Details (indicate cue type and reason): max hand over hand assist for simple grooming ADL                               General ADL Comments: pt overall totalA for ADL at this time      Vision   Additional Comments: pt tending to keep eyes closed due to fatigue; R gaze prefence but is able to turn her head towards the L when therapist to her L side/instructing her  to      Perception     Praxis      Pertinent Vitals/Pain Pain Assessment: Faces Faces Pain Scale: No hurt Pain Intervention(s): Monitored during session     Hand Dominance Right   Extremity/Trunk Assessment Upper Extremity Assessment Upper Extremity Assessment: LUE deficits/detail;Difficult to assess due to impaired cognition;RUE deficits/detail;Generalized weakness RUE Deficits / Details: grossly 2-/5 RUE Coordination: decreased fine motor;decreased gross motor LUE Deficits / Details: no active movement noted this session LUE Coordination: decreased fine motor;decreased gross motor   Lower Extremity Assessment Lower Extremity Assessment: Defer to PT evaluation        Communication Communication Communication: Tracheostomy   Cognition Arousal/Alertness: Awake/alert Behavior During Therapy: Flat affect Overall Cognitive Status: Difficult to assess                                 General Comments: Able to follow 1-2 step commands without difficulty; nods yes/no to questions appropriately. Left inattention and right gaze preference. Lethargic this session   General Comments       Exercises Exercises: General Upper Extremity;General Lower Extremity General Exercises - Upper Extremity Shoulder Flexion: AAROM;Right;PROM;Left;10 reps Elbow Flexion: AAROM;Right;PROM;Left;10 reps Elbow Extension: AAROM;Right;PROM;Left;10 reps Digit Composite Flexion: PROM;Left Composite Extension: PROM;Left General Exercises - Lower Extremity Ankle Circles/Pumps: AAROM;PROM;Both;10 reps Hip ABduction/ADduction: PROM;AAROM;Both;10 reps Hip Flexion/Marching: PROM;AAROM;Both;10 reps   Shoulder Instructions      Home Living Family/patient expects to be discharged to:: Other (Comment) (LTACH)                                 Additional Comments: pt recently in CIR prior to this admission      Prior Functioning/Environment          Comments: prior to initial admit in April, pt independent with ADL, iADL; per lastest notes in CIR pt was standing with Stedy and up to maxA for ADL         OT Problem List: Decreased range of motion;Decreased strength;Decreased activity tolerance;Impaired balance (sitting and/or standing);Decreased cognition;Decreased knowledge of use of DME or AE;Decreased safety awareness;Decreased knowledge of precautions;Impaired vision/perception;Decreased coordination;Cardiopulmonary status limiting activity;Impaired UE functional use      OT Treatment/Interventions: Self-care/ADL training;Therapeutic exercise;Energy conservation;DME and/or AE instruction;Therapeutic activities;Patient/family education;Neuromuscular  education;Manual therapy;Cognitive remediation/compensation;Visual/perceptual remediation/compensation;Splinting;Balance training    OT Goals(Current goals can be found in the care plan section) Acute Rehab OT Goals Patient Stated Goal: per spouse, to get stronger with rehab OT Goal Formulation: With patient/family Time For Goal Achievement: 01/28/20 Potential to Achieve Goals: Good  OT Frequency: Min 2X/week   Barriers to D/C:            Co-evaluation              AM-PAC OT "6 Clicks" Daily Activity     Outcome Measure Help from another person eating meals?: Total Help from another person taking care of personal grooming?: A Lot Help from another person toileting, which includes using toliet, bedpan, or urinal?: Total Help from another person bathing (including washing, rinsing, drying)?: Total Help from another person to put on and taking off regular upper body clothing?: Total Help from another person to put on and taking off regular lower body clothing?: Total 6 Click Score: 7   End of Session Equipment Utilized During Treatment: Oxygen Nurse Communication: Mobility status  Activity Tolerance: Patient tolerated treatment well Patient  left: in bed;with call bell/phone within reach;with family/visitor present  OT Visit Diagnosis: Muscle weakness (generalized) (M62.81);Other abnormalities of gait and mobility (R26.89);Other symptoms and signs involving cognitive function;Hemiplegia and hemiparesis Hemiplegia - Right/Left: Left Hemiplegia - dominant/non-dominant: Non-Dominant Hemiplegia - caused by: Cerebral infarction                Time: 9030-0923 OT Time Calculation (min): 22 min Charges:  OT General Charges $OT Visit: 1 Visit OT Evaluation $OT Eval High Complexity: 1 High  Lou Cal, OT Acute Rehabilitation Services Pager [phone] Office [phone]    Raymondo Band 01/14/2020, 5:31 PM

## 2020-01-14 NOTE — Progress Notes (Signed)
SLP Cancellation Note  Patient Details Name: TALAYSIA PINHEIRO MRN: [id number] DOB: [birth year]   Cancelled treatment:       Reason Eval/Treat Not Completed: Patient at procedure or test/unavailable (Pt having HD at this time. SLP will f/u)  Tobie Poet I. [name], [place], [place] Office number [phone] Pager [place] 01/14/2020, 10:18 AM

## 2020-01-14 NOTE — Progress Notes (Signed)
Referring Physician(s): Dr Charlsie Quest  Supervising Physician: Sandi Mariscal  Patient Status:  [facility] - In-pt  Chief Complaint:  Dysphagia G tube placed in IR 6/29  Subjective:  Resting Dialysis in room Alert-- no communication  Allergies: Patient has no known allergies.  Medications: Prior to Admission medications   Medication Sig Start Date End Date Taking? Authorizing Provider  acetaminophen (TYLENOL) 500 MG tablet Take 500-1,000 mg by mouth every 6 (six) hours as needed for headache (pain).    [provider]  amantadine (SYMMETREL) 50 MG/5ML solution Place 10 mLs (100 mg total) into feeding tube 2 (two) times daily. 11/20/19   Donzetta Starch, NP  Amino Acids-Protein Hydrolys (FEEDING SUPPLEMENT, PRO-STAT SUGAR FREE 64,) LIQD Take 30 mLs by mouth 2 (two) times daily. 11/20/19   Donzetta Starch, NP  aspirin 325 MG tablet Place 1 tablet (325 mg total) into feeding tube daily. 11/21/19   Donzetta Starch, NP  atorvastatin (LIPITOR) 40 MG tablet Place 1 tablet (40 mg total) into feeding tube daily. 11/21/19   Donzetta Starch, NP  Chlorhexidine Gluconate Cloth 2 % PADS Apply 6 each topically daily at 6 (six) AM. 11/21/19   Donzetta Starch, NP  chlorhexidine gluconate, MEDLINE KIT, (PERIDEX) 0.12 % solution 15 mLs by Mouth Rinse route 2 (two) times daily. 11/20/19   Donzetta Starch, NP  diltiazem (CARDIZEM) 10 mg/ml oral suspension Place 6 mLs (60 mg total) into feeding tube every 6 (six) hours. 11/20/19   Donzetta Starch, NP  enoxaparin (LOVENOX) 40 MG/0.4ML injection Inject 0.4 mLs (40 mg total) into the skin daily. 11/20/19   Donzetta Starch, NP  insulin aspart (NOVOLOG) 100 UNIT/ML injection Inject 0-9 Units into the skin 3 (three) times daily with meals. 11/20/19   Donzetta Starch, NP  insulin aspart (NOVOLOG) 100 UNIT/ML injection Inject 8 Units into the skin 2 (two) times daily. 11/20/19   Donzetta Starch, NP  insulin glargine (LANTUS) 100 UNIT/ML injection Inject 0.2 mLs (20 Units total) into the  skin 2 (two) times daily. 11/20/19   Donzetta Starch, NP  Maltodextrin-Xanthan Gum (RESOURCE THICKENUP CLEAR) POWD Take 120 g by mouth as needed (nectar thick liquids). 11/20/19   Donzetta Starch, NP  metoprolol tartrate (LOPRESSOR) 25 MG tablet Place 1 tablet (25 mg total) into feeding tube 2 (two) times daily. 11/20/19   Donzetta Starch, NP  Nutritional Supplements (FEEDING SUPPLEMENT, GLUCERNA 1.5 CAL,) LIQD Place 500 mLs into feeding tube daily. 11/20/19   Donzetta Starch, NP  polyethylene glycol (MIRALAX / GLYCOLAX) 17 g packet Place 17 g into feeding tube 2 (two) times daily. 11/20/19   Donzetta Starch, NP  senna-docusate (SENOKOT-S) 8.6-50 MG tablet Place 1 tablet into feeding tube at bedtime as needed for mild constipation. 11/20/19   Donzetta Starch, NP  senna-docusate (SENOKOT-S) 8.6-50 MG tablet Place 1 tablet into feeding tube 2 (two) times daily. 11/20/19   Donzetta Starch, NP     Vital Signs: BP (!) (P) 129/56 (BP Location: Right Leg)   Pulse 76   Temp 100.3 F (37.9 C) (Axillary)   Resp (!) 25   Ht _0  (1.626 m)   Wt 136 lb 14.5 oz (62.1 kg)   SpO2 97%   BMI 23.50 kg/m   Physical Exam Vitals reviewed.  Skin:    General: Skin is warm and dry.     Comments: Site is clean and dry NT no bleeding +  BS     Imaging: IR GASTROSTOMY TUBE MOD SED  Result Date: 01/13/2020 INDICATION: 64 year old female with acute stroke, referred for gastrostomy EXAM: PERC PLACEMENT GASTROSTOMY MEDICATIONS: 1 g Ancef; Antibiotics were administered within 1 hour of the procedure. ANESTHESIA/SEDATION: Versed 0.5 mg IV; Fentanyl 50 mcg IV Moderate Sedation Time:  10 minutes The patient was continuously monitored during the procedure by the interventional radiology nurse under my direct supervision. CONTRAST:  79m OMNIPAQUE IOHEXOL 300 MG/ML SOLN - administered into the gastric lumen. FLUOROSCOPY TIME:  Fluoroscopy Time: 1 minutes 0 seconds COMPLICATIONS: None PROCEDURE: Informed written consent was obtained from the  patient and the patient's family after a thorough discussion of the procedural risks, benefits and alternatives. All questions were addressed. Maximal Sterile Barrier Technique was utilized including caps, mask, sterile gowns, sterile gloves, sterile drape, hand hygiene and skin antiseptic. A timeout was performed prior to the initiation of the procedure. The epigastrium was prepped with Betadine in a sterile fashion, and a sterile drape was applied covering the operative field. A sterile gown and sterile gloves were used for the procedure. A 5-French orogastric tube is placed under fluoroscopic guidance. Scout imaging of the abdomen confirms barium within the transverse colon. The stomach was distended with gas. Under fluoroscopic guidance, an 18 gauge needle was utilized to puncture the anterior wall of the body of the stomach. An Amplatz wire was advanced through the needle passing a T fastener into the lumen of the stomach. The T fastener was secured for gastropexy. A 9-French sheath was inserted. A snare was advanced through the 9-French sheath. A BBritta Mccreedywas advanced through the orogastric tube. It was snared then pulled out the oral cavity, pulling the snare, as well. The leading edge of the gastrostomy was attached to the snare. It was then pulled down the esophagus and out the percutaneous site. Tube secured in place. Contrast was injected. Patient tolerated the procedure well and remained hemodynamically stable throughout. No complications were encountered and no significant blood loss encountered. IMPRESSION: Status post fluoroscopic placed percutaneous gastrostomy tube, with 20 FP[place]-through. Signed, JDulcy Fanny WEarleen Newport DO [facility] Electronically Signed   By: JCorrie MckusickD.O.   On: 01/13/2020 17:18   IR Fluoro Guide CV Line Right  Result Date: 01/13/2020 INDICATION: [age]year old female, referred for tunneled hemodialysis catheter  placement. EXAM: TUNNELED CENTRAL VENOUS HEMODIALYSIS CATHETER PLACEMENT WITH ULTRASOUND AND FLUOROSCOPIC GUIDANCE MEDICATIONS: 1 g Ancef. The antibiotic was given in an appropriate time interval prior to skin puncture. ANESTHESIA/SEDATION: Moderate (conscious) sedation was employed during this procedure. A total of Versed 0.5 mg and Fentanyl 50 mcg was administered intravenously. Moderate Sedation Time: 15 minutes. The patient's level of consciousness and vital signs were monitored continuously by radiology nursing throughout the procedure under my direct supervision. FLUOROSCOPY TIME:  Fluoroscopy Time: 0 minutes 6 seconds (1 mGy). COMPLICATIONS: None PROCEDURE: Informed written consent was obtained from the patient after a discussion of the risks, benefits, and alternatives to treatment. Questions regarding the procedure were encouraged and answered. The right neck and chest were prepped with chlorhexidine in a sterile fashion, and a sterile drape was applied covering the operative field. Maximum barrier sterile technique with sterile gowns and gloves were used for the procedure. A timeout was performed prior to the initiation of the procedure. Ultrasound survey was performed. Micropuncture kit was utilized to access the right internal jugular vein under direct, real-time ultrasound guidance after the overlying soft tissues were anesthetized with 1%  lidocaine with epinephrine. Stab incision was made with 11 blade scalpel. Microwire was passed centrally. The microwire was then marked to measure appropriate internal catheter length. External tunneled length was estimated. A total tip to cuff length of 19 cm was selected. 035 guidewire was advanced to the level of the IVC. Skin and subcutaneous tissues of chest wall below the clavicle, lateral to the pacing device were generously infiltrated with 1% lidocaine for local anesthesia. A small stab incision was made with 11 blade scalpel. The selected hemodialysis  catheter was tunneled in a retrograde fashion from the anterior chest wall to the venotomy incision. Serial dilation was performed and then a peel-away sheath was placed. The catheter was then placed through the peel-away sheath with tips ultimately positioned within the superior aspect of the right atrium. Final catheter positioning was confirmed and documented with a spot radiographic image. The catheter aspirates and flushes normally. The catheter was flushed with appropriate volume heparin dwells. The catheter exit site was secured with a 0-Prolene retention suture. Gel-Foam slurry was infused into the soft tissue tract. The venotomy incision was closed Derma bond and sterile dressing. Dressings were applied at the chest wall. Patient tolerated the procedure well and remained hemodynamically stable throughout. No complications were encountered and no significant blood loss encountered. IMPRESSION: Status post right IJ tunneled hemodialysis catheter. Catheter ready for use. Signed, Dulcy Fanny. Dellia Nims, RPVI [facility] Radiology Electronically Signed   By: Corrie Mckusick D.O.   On: 01/13/2020 17:17   IR US Guide Vasc Access Right  Result Date: 01/13/2020 INDICATION: 64 year old female, referred for tunneled hemodialysis catheter placement. EXAM: TUNNELED CENTRAL VENOUS HEMODIALYSIS CATHETER PLACEMENT WITH ULTRASOUND AND FLUOROSCOPIC GUIDANCE MEDICATIONS: 1 g Ancef. The antibiotic was given in an appropriate time interval prior to skin puncture. ANESTHESIA/SEDATION: Moderate (conscious) sedation was employed during this procedure. A total of Versed 0.5 mg and Fentanyl 50 mcg was administered intravenously. Moderate Sedation Time: 15 minutes. The patient's level of consciousness and vital signs were monitored continuously by radiology nursing throughout the procedure under my direct supervision. FLUOROSCOPY TIME:  Fluoroscopy Time: 0 minutes 6 seconds (1 mGy).  COMPLICATIONS: None PROCEDURE: Informed written consent was obtained from the patient after a discussion of the risks, benefits, and alternatives to treatment. Questions regarding the procedure were encouraged and answered. The right neck and chest were prepped with chlorhexidine in a sterile fashion, and a sterile drape was applied covering the operative field. Maximum barrier sterile technique with sterile gowns and gloves were used for the procedure. A timeout was performed prior to the initiation of the procedure. Ultrasound survey was performed. Micropuncture kit was utilized to access the right internal jugular vein under direct, real-time ultrasound guidance after the overlying soft tissues were anesthetized with 1% lidocaine with epinephrine. Stab incision was made with 11 blade scalpel. Microwire was passed centrally. The microwire was then marked to measure appropriate internal catheter length. External tunneled length was estimated. A total tip to cuff length of 19 cm was selected. 035 guidewire was advanced to the level of the IVC. Skin and subcutaneous tissues of chest wall below the clavicle, lateral to the pacing device were generously infiltrated with 1% lidocaine for local anesthesia. A small stab incision was made with 11 blade scalpel. The selected hemodialysis catheter was tunneled in a retrograde fashion from the anterior chest wall to the venotomy incision. Serial dilation was performed and then a peel-away sheath was placed. The catheter was then placed through  the peel-away sheath with tips ultimately positioned within the superior aspect of the right atrium. Final catheter positioning was confirmed and documented with a spot radiographic image. The catheter aspirates and flushes normally. The catheter was flushed with appropriate volume heparin dwells. The catheter exit site was secured with a 0-Prolene retention suture. Gel-Foam slurry was infused into the soft tissue tract. The venotomy  incision was closed Derma bond and sterile dressing. Dressings were applied at the chest wall. Patient tolerated the procedure well and remained hemodynamically stable throughout. No complications were encountered and no significant blood loss encountered. IMPRESSION: Status post right IJ tunneled hemodialysis catheter. Catheter ready for use. Signed, Dulcy Fanny. Dellia Nims, RPVI [facility] Radiology Electronically Signed   By: Corrie Mckusick D.O.   On: 01/13/2020 17:17   DG CHEST PORT 1 VIEW  Result Date: 01/12/2020 CLINICAL DATA:  Pleural effusion history of AFib EXAM: PORTABLE CHEST 1 VIEW COMPARISON:  01/08/2020 FINDINGS: LEFT-sided PICC line and LEFT-sided Vas-Cath remain in place. The PICC line terminating in upper RIGHT atrium. Vas-Cath at the caval to atrial junction. Tracheostomy tube in the proximal trachea with similar appearance. Feeding tube courses through in off the field of the radiograph. Dual lead pacer device in situ. Cardiomediastinal contours are stable with pleural fluid and thickening along the LEFT chest and LEFT lower lobe airspace disease with partial obscuration of LEFT hemidiaphragm. Interstitial markings remain mildly increased. RIGHT lung is clear. LEFT-sided rib fractures are again noted. Interval removal of LEFT-sided chest tube since the previous exam. No visible pneumothorax. IMPRESSION: 1. Interval removal of LEFT-sided chest tube. No visible pneumothorax. 2. Persistent small LEFT pleural effusion and LEFT lower lobe airspace disease. Electronically Signed   By: Zetta Bills M.D.   On: 01/12/2020 13:22    Labs:  CBC: Recent Labs    01/11/20 0330 01/12/20 0611 01/13/20 0440 01/14/20 0357  WBC 10.1 9.5 11.6* 12.9*  HGB 7.2* 6.9* 8.5* 8.9*  HCT 23.8* 23.1* 27.9* 28.6*  PLT 198 210 225 232    COAGS: Recent Labs    10/31/19 1744 11/02/19 0243 12/24/2019 0619 01/09/2020 1856 12/22/19 0339 01/06/20 1218  INR 1.2    < > 1.8* 0.9 1.1 1.4*  APTT 27  --  40*  --   --  47*   < > = values in this interval not displayed.    BMP: Recent Labs    01/10/20 0340 01/11/20 0330 01/12/20 0322 01/13/20 0440  NA 135 134* 135 136  K 4.2 4.4 5.0 4.3  CL 95* 95* 96* 98  CO2 _0 GLUCOSE 162* 141* 155* 55*  BUN 39* 65* 89* 45*  CALCIUM 7.6* 7.7* 8.0* 8.0*  CREATININE 2.47* 3.71* 4.43* 2.68*  GFRNONAA 20* 12* 10* 18*  GFRAA 23* 14* 11* 21*    LIVER FUNCTION TESTS: Recent Labs    01/12/2020 1856 12/20/2019 1856 12/23/19 0427 12/23/19 0427 12/25/19 0456 12/26/19 1530 01/05/20 0412 01/05/20 1832 01/06/20 0824 01/11/20 0955  BILITOT 1.4*  --  0.8  --  0.7  --   --   --  0.8  --   AST 262*  --  334*  --  71*  --   --   --  209*  --   ALT 94*  --  150*  --  52*  --   --   --  119*  --   ALKPHOS 123  --  145*  --  129*  --   --   --  966*  --   PROT 5.3*  --  5.9*  --  6.0*  --   --   --  7.1  --   ALBUMIN 2.0*   < > 2.0*   < > 1.7*   < > 1.5* 1.4* 1.4* 1.5*   < > = values in this interval not displayed.    Assessment and Plan:  G tube intact Clean and dry May use now  Electronically Signed: Lavonia Drafts, PA-C 01/14/2020, 6:53 AM   I spent a total of 15 Minutes at the the patient's bedside AND on the patient's hospital floor or unit, greater than 50% of which was counseling/coordinating care for G tube

## 2020-01-14 NOTE — Progress Notes (Addendum)
[facility] Progress Note    Assessment/ Plan:    Acute kidney injury, oliguric ischemic ATN secondary to hemorrhagic shock requiring pressor support.  Refractory to IV diuretics initiated on CRRT 12/26/2019- 6/20.  First IHD 6/21. IHD  6/23, 6/25, no signs of renal recovery and not much output with Lasix challenge 6/24.  S/p RIJ tunneled dialysis catheter with IR on 6/29.  - HD per MWF schedule  - Ordered renal panel for today and daily  - Please note that in her current state she is a poor long term candidate for hemodialysis - Strict ins/outs - please transition from vital to nepro per RD when she is taking feeds  - Please remove nontunneled HD catheter if ok with pulm/critical care   L sided hemothorax: s/p VATS with thoracic surgery 6/6.  S/p removal of one chest tube 6/21.     R MCA stroke: s/p thrombectomy 7/91 complicated by worsening cerebral edema with mass effect s/p decompressive right hemicraniectomy and placement of bone flap on 11/02/19.  Had new ICH 6/6.    Acute hypoxic RF: per PCCM.  S/p trach 6/22, wean as tolerated    Chronic atrial fibrillation: on amiodarone and asa     HTN - UF with HD as tolerated    Seizures: anti-epileptics per neuro    Nutrition: s/p PEG with IR on 6/29   Anemia- s/p multiple pRBCs including one on 6/28 - continue to transfuse as needed.  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   Subjective:    IR placed tunneled HD catheter and PEG on 6/29.  She's had no urine output charted.  Last HD on 6/28 with 1.5 liters UF.  Per nursing she has been following commands overnight.  HD 130/59 on my exam.    Review of systems: unable to obtain secondary to trach/pt status    Objective:   BP 138/64   Pulse 75   Temp 100.3 F (37.9 C) (Axillary)   Resp (!) 24   Ht _0  (1.626 m)   Wt 62.1 kg   SpO2  96%   BMI 23.50 kg/m   Intake/Output Summary (Last 24 hours) at 01/14/2020 0537 Last data filed at 01/14/2020 0500 Gross per 24 hour  Intake 565.72 ml  Output 500 ml  Net 65.72 ml   Weight change: 0.9 kg  Physical Exam:  Gen: lying in bed in NAD  HEENT: R hemicraniectomy, trach in place CVS: RRR Resp: clear bilaterally anteriorly  Abd: soft non distended Ext: no pitting LE edema; 2+ edema LUE  Neuro - she does show her right thumb to me to command; doesn't replicate with left; eyes open on arrival  ACCESS: R IJ tunneled HD cath; left IJ nontunneled HD catheter  Imaging: IR GASTROSTOMY TUBE MOD SED  Result Date: 01/13/2020 INDICATION: 64 year old female with acute stroke, referred for gastrostomy EXAM: PERC PLACEMENT GASTROSTOMY MEDICATIONS: 1 g Ancef; Antibiotics were administered within 1 hour of the procedure. ANESTHESIA/SEDATION: Versed 0.5 mg IV; Fentanyl 50 mcg IV Moderate Sedation Time:  10 minutes The patient was continuously monitored during the procedure by the interventional radiology nurse under my direct supervision. CONTRAST:  32m OMNIPAQUE IOHEXOL 300 MG/ML SOLN - administered into the gastric lumen. FLUOROSCOPY TIME:  Fluoroscopy Time: 1 minutes 0 seconds COMPLICATIONS: None PROCEDURE: Informed written consent was obtained from the patient and the  patient's family after a thorough discussion of the procedural risks, benefits and alternatives. All questions were addressed. Maximal Sterile Barrier Technique was utilized including caps, mask, sterile gowns, sterile gloves, sterile drape, hand hygiene and skin antiseptic. A timeout was performed prior to the initiation of the procedure. The epigastrium was prepped with Betadine in a sterile fashion, and a sterile drape was applied covering the operative field. A sterile gown and sterile gloves were used for the procedure. A 5-French orogastric tube is placed under fluoroscopic guidance. Scout imaging of the abdomen confirms barium  within the transverse colon. The stomach was distended with gas. Under fluoroscopic guidance, an 18 gauge needle was utilized to puncture the anterior wall of the body of the stomach. An Amplatz wire was advanced through the needle passing a T fastener into the lumen of the stomach. The T fastener was secured for gastropexy. A 9-French sheath was inserted. A snare was advanced through the 9-French sheath. A Britta Mccreedy was advanced through the orogastric tube. It was snared then pulled out the oral cavity, pulling the snare, as well. The leading edge of the gastrostomy was attached to the snare. It was then pulled down the esophagus and out the percutaneous site. Tube secured in place. Contrast was injected. Patient tolerated the procedure well and remained hemodynamically stable throughout. No complications were encountered and no significant blood loss encountered. IMPRESSION: Status post fluoroscopic placed percutaneous gastrostomy tube, with 20 [place] pull-through. Signed, Dulcy Fanny. Earleen Newport, DO [facility] Radiology Electronically Signed   By: Corrie Mckusick D.O.   On: 01/13/2020 17:18   IR Fluoro Guide CV Line Right  Result Date: 01/13/2020 INDICATION: 64 year old female, referred for tunneled hemodialysis catheter placement. EXAM: TUNNELED CENTRAL VENOUS HEMODIALYSIS CATHETER PLACEMENT WITH ULTRASOUND AND FLUOROSCOPIC GUIDANCE MEDICATIONS: 1 g Ancef. The antibiotic was given in an appropriate time interval prior to skin puncture. ANESTHESIA/SEDATION: Moderate (conscious) sedation was employed during this procedure. A total of Versed 0.5 mg and Fentanyl 50 mcg was administered intravenously. Moderate Sedation Time: 15 minutes. The patient's level of consciousness and vital signs were monitored continuously by radiology nursing throughout the procedure under my direct supervision. FLUOROSCOPY TIME:  Fluoroscopy Time: 0 minutes 6 seconds (1 mGy). COMPLICATIONS: None  PROCEDURE: Informed written consent was obtained from the patient after a discussion of the risks, benefits, and alternatives to treatment. Questions regarding the procedure were encouraged and answered. The right neck and chest were prepped with chlorhexidine in a sterile fashion, and a sterile drape was applied covering the operative field. Maximum barrier sterile technique with sterile gowns and gloves were used for the procedure. A timeout was performed prior to the initiation of the procedure. Ultrasound survey was performed. Micropuncture kit was utilized to access the right internal jugular vein under direct, real-time ultrasound guidance after the overlying soft tissues were anesthetized with 1% lidocaine with epinephrine. Stab incision was made with 11 blade scalpel. Microwire was passed centrally. The microwire was then marked to measure appropriate internal catheter length. External tunneled length was estimated. A total tip to cuff length of 19 cm was selected. 035 guidewire was advanced to the level of the IVC. Skin and subcutaneous tissues of chest wall below the clavicle, lateral to the pacing device were generously infiltrated with 1% lidocaine for local anesthesia. A small stab incision was made with 11 blade scalpel. The selected hemodialysis catheter was tunneled in a retrograde fashion from the anterior chest wall to the venotomy incision. Serial dilation was performed  and then a peel-away sheath was placed. The catheter was then placed through the peel-away sheath with tips ultimately positioned within the superior aspect of the right atrium. Final catheter positioning was confirmed and documented with a spot radiographic image. The catheter aspirates and flushes normally. The catheter was flushed with appropriate volume heparin dwells. The catheter exit site was secured with a 0-Prolene retention suture. Gel-Foam slurry was infused into the soft tissue tract. The venotomy incision was closed  Derma bond and sterile dressing. Dressings were applied at the chest wall. Patient tolerated the procedure well and remained hemodynamically stable throughout. No complications were encountered and no significant blood loss encountered. IMPRESSION: Status post right IJ tunneled hemodialysis catheter. Catheter ready for use. Signed, Dulcy Fanny. Dellia Nims, RPVI [facility] Radiology Electronically Signed   By: Corrie Mckusick D.O.   On: 01/13/2020 17:17   IR US Guide Vasc Access Right  Result Date: 01/13/2020 INDICATION: 64 year old female, referred for tunneled hemodialysis catheter placement. EXAM: TUNNELED CENTRAL VENOUS HEMODIALYSIS CATHETER PLACEMENT WITH ULTRASOUND AND FLUOROSCOPIC GUIDANCE MEDICATIONS: 1 g Ancef. The antibiotic was given in an appropriate time interval prior to skin puncture. ANESTHESIA/SEDATION: Moderate (conscious) sedation was employed during this procedure. A total of Versed 0.5 mg and Fentanyl 50 mcg was administered intravenously. Moderate Sedation Time: 15 minutes. The patient's level of consciousness and vital signs were monitored continuously by radiology nursing throughout the procedure under my direct supervision. FLUOROSCOPY TIME:  Fluoroscopy Time: 0 minutes 6 seconds (1 mGy). COMPLICATIONS: None PROCEDURE: Informed written consent was obtained from the patient after a discussion of the risks, benefits, and alternatives to treatment. Questions regarding the procedure were encouraged and answered. The right neck and chest were prepped with chlorhexidine in a sterile fashion, and a sterile drape was applied covering the operative field. Maximum barrier sterile technique with sterile gowns and gloves were used for the procedure. A timeout was performed prior to the initiation of the procedure. Ultrasound survey was performed. Micropuncture kit was utilized to access the right internal jugular vein under direct, real-time ultrasound  guidance after the overlying soft tissues were anesthetized with 1% lidocaine with epinephrine. Stab incision was made with 11 blade scalpel. Microwire was passed centrally. The microwire was then marked to measure appropriate internal catheter length. External tunneled length was estimated. A total tip to cuff length of 19 cm was selected. 035 guidewire was advanced to the level of the IVC. Skin and subcutaneous tissues of chest wall below the clavicle, lateral to the pacing device were generously infiltrated with 1% lidocaine for local anesthesia. A small stab incision was made with 11 blade scalpel. The selected hemodialysis catheter was tunneled in a retrograde fashion from the anterior chest wall to the venotomy incision. Serial dilation was performed and then a peel-away sheath was placed. The catheter was then placed through the peel-away sheath with tips ultimately positioned within the superior aspect of the right atrium. Final catheter positioning was confirmed and documented with a spot radiographic image. The catheter aspirates and flushes normally. The catheter was flushed with appropriate volume heparin dwells. The catheter exit site was secured with a 0-Prolene retention suture. Gel-Foam slurry was infused into the soft tissue tract. The venotomy incision was closed Derma bond and sterile dressing. Dressings were applied at the chest wall. Patient tolerated the procedure well and remained hemodynamically stable throughout. No complications were encountered and no significant blood loss encountered. IMPRESSION: Status post right IJ tunneled hemodialysis catheter. Catheter ready  for use. Signed, Dulcy Fanny. Dellia Nims, RPVI [facility] Radiology Electronically Signed   By: Corrie Mckusick D.O.   On: 01/13/2020 17:17   DG CHEST PORT 1 VIEW  Result Date: 01/12/2020 CLINICAL DATA:  Pleural effusion history of AFib EXAM: PORTABLE CHEST 1 VIEW COMPARISON:   01/08/2020 FINDINGS: LEFT-sided PICC line and LEFT-sided Vas-Cath remain in place. The PICC line terminating in upper RIGHT atrium. Vas-Cath at the caval to atrial junction. Tracheostomy tube in the proximal trachea with similar appearance. Feeding tube courses through in off the field of the radiograph. Dual lead pacer device in situ. Cardiomediastinal contours are stable with pleural fluid and thickening along the LEFT chest and LEFT lower lobe airspace disease with partial obscuration of LEFT hemidiaphragm. Interstitial markings remain mildly increased. RIGHT lung is clear. LEFT-sided rib fractures are again noted. Interval removal of LEFT-sided chest tube since the previous exam. No visible pneumothorax. IMPRESSION: 1. Interval removal of LEFT-sided chest tube. No visible pneumothorax. 2. Persistent small LEFT pleural effusion and LEFT lower lobe airspace disease. Electronically Signed   By: Zetta Bills M.D.   On: 01/12/2020 13:22    Labs: BMET Recent Labs  Lab 01/08/20 0405 01/09/20 0443 01/10/20 0340 01/11/20 0330 01/12/20 0322 01/13/20 0440  NA 135 136 135 134* 135 136  K 4.1 5.1 4.2 4.4 5.0 4.3  CL 97* 97* 95* 95* 96* 98  CO2 _0 GLUCOSE 145* 109* 162* 141* 155* 55*  BUN 36* 65* 39* 65* 89* 45*  CREATININE 2.47* 3.86* 2.47* 3.71* 4.43* 2.68*  CALCIUM 7.6* 7.8* 7.6* 7.7* 8.0* 8.0*  PHOS 4.0 6.7* 4.7* 4.8* 5.5*  --    CBC Recent Labs  Lab 01/11/20 0330 01/12/20 0611 01/13/20 0440 01/14/20 0357  WBC 10.1 9.5 11.6* 12.9*  HGB 7.2* 6.9* 8.5* 8.9*  HCT 23.8* 23.1* 27.9* 28.6*  MCV 100.4* 100.4* 97.2 97.6  PLT 198 210 225 232    Medications:    . amantadine  100 mg Per Tube BID  . amiodarone  200 mg Per Tube Daily  . aspirin  325 mg Per Tube Daily  . atorvastatin  40 mg Per Tube Daily  . chlorhexidine gluconate (MEDLINE KIT)  15 mL Mouth Rinse BID  . Chlorhexidine Gluconate Cloth  6 each Topical Q0600  . Chlorhexidine Gluconate Cloth  6 each Topical  Q0600  . darbepoetin (ARANESP) injection - DIALYSIS  100 mcg Intravenous Q Fri-HD  . docusate  100 mg Per Tube BID  . feeding supplement (PRO-STAT SUGAR FREE 64)  30 mL Per Tube Daily  . Gerhardt's butt cream   Topical TID  . insulin aspart  0-20 Units Subcutaneous Q4H  . mouth rinse  15 mL Mouth Rinse 10 times per day  . multivitamin  1 tablet Oral QHS  . neomycin-bacitracin-polymyxin  1 application Topical Daily  . pantoprazole sodium  40 mg Per Tube QHS  . phenytoin (DILANTIN) IV  100 mg Intravenous Q8H  . polyethylene glycol  17 g Per Tube TID  . propofol  500 mg Intravenous Once  . senna-docusate  1 tablet Per Tube BID  . sodium chloride flush  10-40 mL Intracatheter Q12H     Claudia Desanctis, MD 01/14/2020, 5:54 AM

## 2020-01-14 NOTE — Evaluation (Signed)
[facility]y-[facility] Speaking Valve - Evaluation Patient Details  Name: Krystal Little MRN: [id number] Date of Birth: 06/01/1956  Today's Date: 01/14/2020 Time: 9798-9211 SLP Time Calculation (min) (ACUTE ONLY): 14 min  Past Medical History:  Past Medical History:  Diagnosis Date  . Chronic atrial fibrillation ([place]) 1990s   s/p DCCV then attempted ablation of complex A Flutter (@ [facility] - Dr. Deno Etienne), failed antiarrhythmics --> INR folllwed @ [facility] ->> now status post pacemaker placement with underlying A. fib.  . Diabetes mellitus type 2, uncontrolled, without complications    On Oral Medications ([place] [facility])  . Essential hypertension   . History of cardiac catheterization 2001   R&LHC - normal Coronaries, no evidence of Restictive Cardiomyopathy or Constrictive Pericarditis (also @ [facility])  . Hx of sick sinus syndrome 07/1999   Wtih symptomatic bradycardia - syncope ([place])  . 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 CARDIAC CATHETERIZATION  08/03/1999   [place] - normal Coronaries; no sign of constriction or restrictive cardiomypathy  . CRANIECTOMY FOR DEPRESSED SKULL FRACTURE Right 11/02/2019   Procedure: DECOMPRESSIVE HEMI -CRANIECTOMY, IMPLANTATION OF SKULL FLAP TO RIGHT ABDOMEN;  Surgeon: Consuella Lose, MD;  Location: [place];  Service: Neurosurgery;  Laterality: Right;  . IR CT HEAD LTD  10/31/2019  . IR FLUORO GUIDE CV LINE RIGHT  01/13/2020  . IR GASTROSTOMY TUBE MOD SED  01/13/2020  . IR PERCUTANEOUS ART THROMBECTOMY/INFUSION INTRACRANIAL INC  DIAG ANGIO  10/31/2019      . IR PERCUTANEOUS ART THROMBECTOMY/INFUSION INTRACRANIAL INC DIAG ANGIO  10/31/2019  . IR US GUIDE VASC ACCESS RIGHT  01/13/2020  . MITRAL VALVE REPAIR  07/1999   Both Ant& Post leaflet repair - quadrangular resection&caudal transposition, #32 Sequin Annuloplasty ring  . PACEMAKER GENERATOR CHANGE  11/26/2008   Medtronic Adapta L(? if it has been checked since 02/2013)  . PACEMAKER INSERTION  07/1999   for SSS in setting of Chronic Afib; Medtronic Kappa [id number], [id number].  Marland Kitchen RADIOLOGY WITH ANESTHESIA N/A 10/31/2019   Procedure: IR WITH ANESTHESIA;  Surgeon: Radiologist, Medication, MD;  Location: [place];  Service: Radiology;  Laterality: N/A;  . TRANSTHORACIC ECHOCARDIOGRAM  05/2018   Normal LV size and thickness.  EF estimated 50%.  Inferobasal HK and flattened septum c/w with elevated PA pressures. Mild functional mitral stenosis at rest (postoperative).  Moderate left atrial dilation and mild right atrial dilation.  Mean PA pressures estimated 52 mmHg.  Marland Kitchen VIDEO ASSISTED THORACOSCOPY (VATS)/THOROCOTOMY Left 12/16/2019   Procedure: Video Assisted Thoracoscopy (Vats) left , Drainage of Hemothorax;  Surgeon: Ivin Poot, MD;  Location: [place];  Service: Thoracic;  Laterality: Left;  Marland Kitchen VIDEO BRONCHOSCOPY N/A 01/05/2020   Procedure: Video Bronchoscopy;  Surgeon: Ivin Poot, MD;  Location: [place];  Service: Thoracic;  Laterality: N/A;   HPI:  Pt is a 64 y.o. female presented to [facility] ED as a code stroke for R gaze deviation and L-sided weakness. CT revealed a large R MCA infarct. Pt s/p R hemicraniectomy with bone flap placed in abdomen on 4/18.  ETT 4/18-4/25. PMH HTN, DM2, chronic a. Fib (coumadin) s/p pace maker. Pt admitted to [facility] 11/20/19.  Pulmonary and critical care medicine was consulted on May 29 in the setting of tachypnea and an enlarging left-sided pleural effusion. Thoracentesis was performed which showed a hemothorax.  A CT chest was then ordered showing  obstruction of her left mainstem bronchus and a loculated left pleural effusion.  On May 30 she was moved to the ICU for chest tube placement and bronchoscopy. Pt was re-intubated from 12/03/2019-12/19/19. Most recent MBS on 12/03/19 recommended Dysphagia 2 solids and thin liquids. Right frontocentral seizures seizures noted on EEG 6/12. Trach 6/22. G-tube and tunnel cath on 6/29.    Assessment / Plan / Recommendation Clinical Impression  Pt was seen for PMSV evaluation. She was lethargic and required frequent stimulation to maintain alertness. Pt and her husband were educated regarding the anatomy of the larynx, the impact of the trach on voicing, and the goals of the session. Pt and her husband verbalized understanding. Cuff was deflated upon SLP's entry. Pt tolerated finger occlusion without evidence of air trapping. She demonstrated a low vocal intensity and breathy vocal quality which together impacted speech intelligibility. She tolerated PMSV for 10 minutes with vitals ranging RR 24-27, SpO2 97-98, and HR 62-65. She also exhibited difficulty coordinating respiration with speech and WOB was mildly increased compared to baseline. PMSV was removed at the end of evaluation which was concluded due to pt's lethargy. It is recommended that PMSV only be used with SLP at this time.   SLP Visit Diagnosis: Aphonia (R49.1)    SLP Assessment  Patient needs continued Speech Lanaguage Pathology Services    Follow Up Recommendations  LTACH    Frequency and Duration min 2x/week  2 weeks    PMSV Trial PMSV was placed for: 10 Able to redirect subglottic air through upper airway: Yes Able to Attain Phonation: Yes Voice Quality: Low vocal intensity;Breathy Able to Expectorate Secretions: No attempts Level of Secretion Expectoration with PMSV: Not observed Breath Support for Phonation: Severely decreased Intelligibility: Intelligibility reduced Word: 25-49% accurate Phrase: 0-24% accurate Sentence: 0-24%  accurate Conversation: 0-24% accurate Respirations During Trial:  (24-27) SpO2 During Trial:  (97-98) Pulse During Trial:  (62-65) Behavior: Lethargic;Responsive to questions;Poor eye contact   Tracheostomy Tube       Vent Dependency  FiO2 (%): 28 %    Cuff Deflation Trial  Hektor Huston I. [name], MS, [place] Office number [phone] Pager [phone] Tolerated Cuff Deflation: Yes Length of Time for Cuff Deflation Trial: Cuff deflated at baseline        Horton Marshall 01/14/2020, 4:59 PM

## 2020-01-15 DIAGNOSIS — Z930 Tracheostomy status: Secondary | ICD-10-CM

## 2020-01-15 LAB — RENAL FUNCTION PANEL
Albumin: 1.3 g/dL — ABNORMAL LOW (ref 3.5–5.0)
Anion gap: 7 (ref 5–15)
BUN: 35 mg/dL — ABNORMAL HIGH (ref 8–23)
CO2: 28 mmol/L (ref 22–32)
Calcium: 7.9 mg/dL — ABNORMAL LOW (ref 8.9–10.3)
Chloride: 99 mmol/L (ref 98–111)
Creatinine, Ser: 2.69 mg/dL — ABNORMAL HIGH (ref 0.44–1.00)
GFR calc Af Amer: 21 mL/min — ABNORMAL LOW (ref >60)
GFR calc non Af Amer: 18 mL/min — ABNORMAL LOW (ref >60)
Glucose, Bld: 141 mg/dL — ABNORMAL HIGH (ref 70–99)
Phosphorus: 3.9 mg/dL (ref 2.5–4.6)
Potassium: 3.1 mmol/L — ABNORMAL LOW (ref 3.5–5.1)
Sodium: 134 mmol/L — ABNORMAL LOW (ref 135–145)

## 2020-01-15 LAB — CBC WITH DIFFERENTIAL/PLATELET
Abs Immature Granulocytes: 0.04 10*3/uL (ref 0.00–0.07)
Basophils Absolute: 0 10*3/uL (ref 0.0–0.1)
Basophils Relative: 0 %
Eosinophils Absolute: 0.1 10*3/uL (ref 0.0–0.5)
Eosinophils Relative: 1 %
HCT: 28.6 % — ABNORMAL LOW (ref 36.0–46.0)
Hemoglobin: 8.7 g/dL — ABNORMAL LOW (ref 12.0–15.0)
Immature Granulocytes: 0 %
Lymphocytes Relative: 10 %
Lymphs Abs: 1.2 10*3/uL (ref 0.7–4.0)
MCH: 29.8 pg (ref 26.0–34.0)
MCHC: 30.4 g/dL (ref 30.0–36.0)
MCV: 97.9 fL (ref 80.0–100.0)
Monocytes Absolute: 1.1 10*3/uL — ABNORMAL HIGH (ref 0.1–1.0)
Monocytes Relative: 9 %
Neutro Abs: 8.9 10*3/uL — ABNORMAL HIGH (ref 1.7–7.7)
Neutrophils Relative %: 80 %
Platelets: 199 10*3/uL (ref 150–400)
RBC: 2.92 MIL/uL — ABNORMAL LOW (ref 3.87–5.11)
RDW: 20.6 % — ABNORMAL HIGH (ref 11.5–15.5)
WBC: 11.3 10*3/uL — ABNORMAL HIGH (ref 4.0–10.5)
nRBC: 0 % (ref 0.0–0.2)

## 2020-01-15 LAB — GLUCOSE, CAPILLARY
Glucose-Capillary: 138 mg/dL — ABNORMAL HIGH (ref 70–99)
Glucose-Capillary: 74 mg/dL (ref 70–99)
Glucose-Capillary: 145 mg/dL — ABNORMAL HIGH (ref 70–99)
Glucose-Capillary: 127 mg/dL — ABNORMAL HIGH (ref 70–99)
Glucose-Capillary: 161 mg/dL — ABNORMAL HIGH (ref 70–99)
Glucose-Capillary: 117 mg/dL — ABNORMAL HIGH (ref 70–99)

## 2020-01-15 MED ORDER — CEFAZOLIN SODIUM-DEXTROSE 2-4 GM/100ML-% IV SOLN: 2.0000 g | Freq: Once | INTRAVENOUS | Status: DC

## 2020-01-15 MED ORDER — POTASSIUM CHLORIDE 20 MEQ/15ML (10%) PO SOLN
20.0000 meq | Freq: Once | ORAL | Status: AC
  Administered 2020-01-15: 20 meq
  Filled 2020-01-15: qty 15

## 2020-01-15 MED ORDER — CHLORHEXIDINE GLUCONATE CLOTH 2 % EX PADS
6.0000 | MEDICATED_PAD | Freq: Every day | CUTANEOUS | Status: DC
  Administered 2020-01-16 – 2020-01-31 (×15): 6 via TOPICAL

## 2020-01-15 MED ORDER — PHENYTOIN 50 MG PO CHEW
100.0000 mg | CHEWABLE_TABLET | Freq: Three times a day (TID) | ORAL | Status: DC
  Administered 2020-01-15 – 2020-01-28 (×38): 100 mg
  Filled 2020-01-15 (×41): qty 2

## 2020-01-15 NOTE — Progress Notes (Signed)
Daily Progress Note   Patient Name: Krystal Little       Date: 01/15/2020 DOB: 20-Oct-1955  Age: 64 y.o. MRN#: [id number] Attending Physician: Marshell Garfinkel, MD Primary Care Physician: Eulis Foster, MD Admit Date: 12/13/2019  Reason for Consultation/Follow-up: Establishing goals of care  Subjective: Visited with patient and spouse- spouse encouraged by patient's progress by coming off of ventilator and progressing to using PMV. He continues to be hopeful she will be able to talk more.  He shared his feelings on the current discharge planning- he is not open to [facility]- he knows people who work there and has visited a friend who stayed there. He shares his feelings that people are given preferential treatment to go to [facility] ("if Daisy Floro wanted to go to [facility] I'm sure they would find him a bed").   ROS  Length of Stay: 32  Current Medications: Scheduled Meds:  . amantadine  100 mg Per Tube BID  . amiodarone  200 mg Per Tube Daily  . aspirin  325 mg Per Tube Daily  . atorvastatin  40 mg Per Tube Daily  . chlorhexidine gluconate (MEDLINE KIT)  15 mL Mouth Rinse BID  . Chlorhexidine Gluconate Cloth  6 each Topical Q0600  . Chlorhexidine Gluconate Cloth  6 each Topical Q0600  . [START ON 01/16/2020] Chlorhexidine Gluconate Cloth  6 each Topical Q0600  . darbepoetin (ARANESP) injection - DIALYSIS  100 mcg Intravenous Q Fri-HD  . docusate  100 mg Per Tube BID  . feeding supplement (PRO-STAT SUGAR FREE 64)  30 mL Per Tube Daily  . Gerhardt's butt cream   Topical TID  . insulin aspart  0-20 Units Subcutaneous Q4H  . mouth rinse  15 mL Mouth Rinse 10 times per day  . multivitamin  1 tablet Per Tube QHS  . neomycin-bacitracin-polymyxin  1 application Topical Daily  .  pantoprazole sodium  40 mg Per Tube QHS  . phenytoin  100 mg Per Tube Q8H  . polyethylene glycol  17 g Per Tube TID  . propofol  500 mg Intravenous Once  . senna-docusate  1 tablet Per Tube BID  . sodium chloride flush  10-40 mL Intracatheter Q12H    Continuous Infusions: . sodium chloride Stopped (12/30/19 2139)  . sodium chloride    . sodium chloride    .  ceFAZolin (ANCEF) IV    . dextrose Stopped (01/14/20 1609)  . feeding supplement (NEPRO CARB STEADY) 1,000 mL (01/15/20 1252)  . levETIRAcetam 750 mg (01/14/20 2206)  . norepinephrine (LEVOPHED) Adult infusion Stopped (01/10/20 0936)    PRN Meds: sodium chloride, sodium chloride, sodium chloride, acetaminophen, alteplase, bisacodyl, fentaNYL (SUBLIMAZE) injection, heparin, hydrALAZINE, lidocaine (PF), lidocaine-prilocaine, midazolam, ondansetron (ZOFRAN) IV, oxyCODONE, pentafluoroprop-tetrafluoroeth, senna-docusate, sodium chloride flush  Physical Exam Vitals and nursing note reviewed.             Vital Signs: BP (!) 118/53   Pulse 84   Temp 98.9 F (37.2 C) (Oral)   Resp (!) 28   Ht '5\' 4"'  (1.626 m)   Wt 60 kg   SpO2 98%   BMI 22.71 kg/m  SpO2: SpO2: 98 % O2 Device: O2 Device: Tracheostomy Collar O2 Flow Rate: O2 Flow Rate (L/min): 5 L/min  Intake/output summary:   Intake/Output Summary (Last 24 hours) at 01/15/2020 1515 Last data filed at 01/15/2020 1500 Gross per 24 hour  Intake 1375.78 ml  Output --  Net 1375.78 ml   LBM: Last BM Date: 01/13/20 Baseline Weight: Weight: 61.3 kg Most recent weight: Weight: 60 kg       Palliative Assessment/Data: PPS: 10%    Flowsheet Rows     Most Recent Value  Intake Tab  Referral Department Hospitalist  Unit at Time of Referral ICU  Palliative Care Primary Diagnosis Neurology  Date Notified 12/31/19  Palliative Care Type New Palliative care  Reason for referral Clarify Goals of Care  Date of Admission 11/23/2019  Date first seen by Palliative Care 12/31/19  # of  days Palliative referral response time 0 Day(s)  # of days IP prior to Palliative referral 17  Clinical Assessment  Psychosocial & Spiritual Assessment  Palliative Care Outcomes      Patient Active Problem List   Diagnosis Date Noted  . Acute respiratory failure with hypoxemia ([place])   . Seizures ([place])   . Hemothorax   . Aspiration pneumonia ([place])   . Septic shock ([place])   . Goals of care, counseling/discussion   . Advanced care planning/counseling discussion   . Poor prognosis   . Palliative care by specialist   . Endotracheally intubated   . Respiratory failure requiring intubation ([place])   . Pleural effusion on left   . Hx of CABG   . AKI (acute kidney injury) ([place])   . Hemorrhagic shock ([place])   . Pleural effusion 12/13/2019  . Labile blood pressure   . Essential hypertension   . Benign essential HTN   . Labile blood glucose   . Status post craniectomy   . Dysphagia, post-stroke   . Atrial fibrillation ([place])   . Left hemiparesis ([place])   . Cerebral edema ([place]) 11/20/2019  . ICH (intracerebral hemorrhage), SAH ([place]) result of ischemic stroke s/p crani 11/20/2019  . Dysphagia due to recent cerebral infarction 11/20/2019  . Acute blood loss anemia 11/20/2019  . 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 History of ETT   . Hypokalemia   . Acute respiratory failure ([place]) 11/02/2019  . Cerebrovascular accident (CVA) due to embolism of right middle cerebral artery ([place]) 10/31/2019  . Chronic venous insufficiency 10/07/2019  . Wound of RLE  08/18/2019  . Hyperlipidemia associated with type 2 diabetes mellitus ([place]) 09/25/2016  . Diastolic dysfunction without heart failure 06/02/2015  . Health care maintenance 03/24/2013  . Hypertension associated with diabetes ([place]) 10/16/2011  . Long term  current use of anticoagulant therapy 08/27/2010  . DM (diabetes mellitus), type 2, uncontrolled ([place]) 02/11/2008  . MITRAL REGURGITATION - status post MVR 09/14/1999  . S/P  MVR (mitral valve repair) 08/09/1999  . Atrial fibrillation with RVR ([place]) 08/03/1999  . S/P placement of cardiac pacemaker 07/18/1999    Palliative Care Assessment & Plan   Patient Profile: 64 y.o. 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 Patient had some progression in rehab- was able to sit up in wheelchair, using hemiwalker for limited mobility, dunking basketballs, speech was improving however significant cognitive impairments remained- notably her ability to take in oral nutrition was impaired by prolonged mastication and persistent oral residue, however no overt signs of aspiration. She developed SOB on 5/28 and was found to have pleural effusion on chest xray. PCCM conducted thoracentesis on 5/29 that yielded 1531m of bloody fluid. Shortness of breath persisted and CT scan showed obstruction of her L mainstem bronchus (possibly due to aspiration) with loculated pleural effusion and [facility].  She developed septic (r/t to aspiration pneumonia and hemothorax) shock and had worsening renal function and hypotension requiring levophed. Extubated on 6/4. 6/6 had episode of severe hypotension, altered mental status, reintubated, restarted on pressors- had large volume output of blood via chestube resulting in hemorrhagic shock. 6/6 had VATS and bronchoscopy to drain hemothorax and clear secretions. 6/6 CT scan head positive for new hemorrhages- no role for  neurosurgery. CVVHD started on 6/11 due to progressive worsening renal function. 6/12 EEG noted seizures. She remains virtually unresponsive- spouse and RN did report some purposeful upper extremity movements 6/15. Palliative medicine consulted for assistance with goals of care.     Assessment/Recommendations/Plan   Patient with very long and complicated recovery from multiple CVA's - noted that she is poor candidate for long term dialysis- disposition difficult due to trach and dialysis needs - cannot go to SNF if she cannot go to outpatient dialysis center- this is likely to become a disposition issue   Family has very clearly stated their goals are for continued aggressive care and aggressive rehab therapy with no limits to be set  Palliative will sign off for now- however, please reconsult if we can be of further assistance in the future  Goals of Care and Additional Recommendations:  Limitations on Scope of Treatment: Full Scope Treatment  Code Status:  Full code  Prognosis:   Unable to determine  Discharge Planning:  To Be Determined  Care plan was discussed with patient's spouse  Thank you for allowing the Palliative Medicine Team to assist in the care of this patient.   Time In: 1230 Time Out: 1305 Total Time 35 minutes Prolonged Time Billed no      Greater than 50%  of this time was spent counseling and coordinating care related to the above assessment and plan.  KMariana Kaufman AGNP-C Palliative Medicine   Please contact Palliative Medicine Team phone at [phone] questions and concerns.

## 2020-01-15 NOTE — Progress Notes (Addendum)
NAME:  Krystal Little, MRN:  [id number], DOB:  23-Dec-1955, LOS: 78 ADMISSION DATE:  11/29/2019, CONSULTATION DATE:  5/29 REFERRING MD:  Letta Pate, CHIEF COMPLAINT:  Chest congestion   Brief History   55 female with an extensive cardiac history was admitted to [facility] in April in the setting of a right MCA stroke, had a right interventional radiology guided thrombectomy, later had significant brain edema and underwent craniectomy, ultimately transferred to inpatient rehab.  Pulmonary and critical care medicine was consulted on May 29 in the setting of tachypnea and an enlarging left-sided pleural effusion. Thoracentesis was performed which showed a hemothorax.  A CT chest was then ordered showing obstruction of her left mainstem bronchus and a loculated left pleural effusion.  On May 30 she was moved to the ICU for chest tube placement and bronchoscopy.  Underwent intrapleural TPA but required VATS eventually, course further complicated by ICH , seizures and AKI requiring CRRT  Past Medical History  Status post mitral valve repair 2001 Status post cardiac pacemaker 2001 History of sick sinus syndrome Hypertension Diabetes mellitus type 2 Chronic atrial fibrillation Right MCA stroke 2021  Significant Hospital Events   4/16 IR thrombectomy R MCA stroke 4/25 PCCM sign off 5/6 transfer to [facility] 5/29 PCCM consult left pleural effusion, thoracentesis 5/30 worsening tachypnea. Move to ICU for intubation to facilitate bronchoscopy and chest tube placement 5/31 TPA/ pulmonzyme started for 3 days for persistent left effusion 6/01 Afebrile, low-dose Levophed, Precedex, Minimal output on chest tube- pulmozyme / tpa x 2,  6/02 Weaned x 7 hours yesterday PSV 15/5, pulmozyme and TPA completed 3 days 6/03 afib with RVR placed on amio gtt overnight , 1U PRBC for Hb 7.1  6/05 extubated successfully, Repeat TPA/Pulmonzyme given again  6/06 acute decompensation overnight,  hemorrhagic shock > to OR for left VATS with 2L hemothorax drained.  CT head with new ICH> neurosurgery consulted 6/09 More awake but agitated on PSV weaning  6/12: Seizures + on EEG  6/22 trach 6/27 More awake 6/29 g tube and tunneled vascular catheter placed  Consults:  PCCM  Procedures:  5/29 Thoracentesis >> 1.5 L bloody fluid 5/30 ETT > 6/22, Trach 6/22 >>  5/30 L pigtail chest tube > 6/6 [facility] HD cath 6/12 >6/29 6/29 g tube and tunneled vascular catheter placed   Bronchoscopy 5/30 >>  large, thick, grey mucus plug completely occluding the left mainstem bronchus.   Significant Diagnostic Tests:  Thoracentesis left chest 5/29 >> 8K WBC, 78% WBC, protein 4.0, LDH 182 CT chest abdomen pelvis 6/6 >> large left hemithorax, patchy right lung opacities with small right effusion.  No acute abdominal process CT head 6/6 >> evolution of right MCA infarct with hemorrhagic transformation with large intraparenchymal hematoma, 2 additional small hemorrhages in the inferior cerebellum and left temporal lobe.  Trace subarachnoid blood. CT Head 6/10 >> stable hemorrhagic R MCA.  Right hemi-craniectomy with no midline shift or mass effect.  Mildly regressed small cerebellar hemorrhages.  Stable small volume intraventricular hemorrhage, no ventriculomegaly.  Trace subarachnoid hemorrhage largely resolved.  6/12 EEG: right frontocentral seizures X2   Micro Data:   RVP 4/16 neg  MRSA PCR  4/17 neg   ======== 5/29 Left pleural fluid culture > negative 5/29 left pleural fluid cytology >> No malignant cells identified. Blood and acute inflammation.  5/30 BAL >> rare candida albicans 6/6 L Pleural Fluid >> negative 6/6 BCx2 >> negative   Antimicrobials:  Cefepime 4/24 >> 4/25  Zosyn 4/28 >> 5/4 Zosyn 5/30 >> 6/5 Cefepime 6/6 >> 6/12  Interim history/subjective:  6/30:  S/p g tube and tunneled vascular catheter placement yesterday. Will ask dietary to change tube feeds to nepro per nephrology  recs. Hypoglycemic events yesterday 2/2 npo status prompted initiation of d10 that we can stop as tf back on. Also d/c'd her lantus which may need to be reinitiated but will follow bs first. Relatively significant secretions from trach this am. Will send for cx.  6/29:  Pt awake and following commands. Denies pain. Strong RU and RLE. Flaccid L. BS low this am, npo for peg and tunneled catheter this am.  6/28: pt drowsy on ihd this am. Denies pain. Moves RUE.   6/27:More awake. Denies pain.  Objective   Blood pressure (!) 151/61, pulse 65, temperature 98.8 F (37.1 C), temperature source Oral, resp. rate (!) 26, height _0  (1.626 m), weight 60 kg, SpO2 99 %.    Vent Mode: Stand-by FiO2 (%):  [28 %] 28 %   Intake/Output Summary (Last 24 hours) at 01/15/2020 0811 Last data filed at 01/15/2020 0500 Gross per 24 hour  Intake 1041.24 ml  Output 1500 ml  Net -458.76 ml   Filed Weights   01/14/20 0500 01/14/20 1030 01/15/20 0500  Weight: 62.1 kg 62 kg 60 kg    Examination:  Gen:      No acute distress, chronically ill appearing HEENT:  EOMI, sclera anicteric, trach Neck:     No masses; no thyromegaly Lungs:    Clear to auscultation bilaterally; normal respiratory effort CV:         Regular rate and rhythm; no murmurs Abd:      + bowel sounds; soft, non-tender; no palpable masses, no distension Ext:    No edema; adequate peripheral perfusion Skin:      Warm and dry; no rash Neuro: Sedated  Labs reviewed significant for potassium 3.1, BUN/creatinine 35/2.69, WBC 11.3, hemoglobin 8.7  Assessment & Plan:   Acute Respiratory Failure with Hypoxemia- trach 6/22 Mucus Plugging, Suspected Aspiration   Left loculated effusion complicated by hemothorax post VATS decortications: Continue trach collar, when bed night Wean as tolerated Intermittent chest x-ray  Macrocytic anemia:  -b12 :2230 and folate: 10.4 Follow CBC.  No significant bleeding noted.  AKI secondary to shock - no evidence  of obstruction on renal US. -s/p tunneled catheter 6/29 - Continue iHD, appreciate nephrology assistance  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 - s/p right hemicraniotomy, complicated by [place] after intrapleural tPA.   -No neurosurgical intervention -Supportive care -s/p PEG and tunneled HD catheter  Status Epilepticus, likely related to underlying stroke history :EEG off 6/14 -Keppra and Dilantin per neuro, as needed Ativan for seizure-like activity - Change Dilantin to p.o.  DM2 with Hyperglycemia HYPOglycemia event this am.  -CBG, SSI  - Holding insulin as sugars are still low.  Chronic Atrial Fibrillation s/p Pacemaker - Telemetry - Full dose aspirin and subQ heparin will have to do for foreseeable future - Oral amiodarone  Moderate Protein Calorie Malnutrition  - Continue tube feeding per nutrition.   Goals of Care - Full code, should be stable for LTACH vs. SNF depending on if we can wean from vent  Best practice:  Diet: TF Pain/Anxiety/Delirium protocol (if indicated): fent prn VAP protocol (if indicated): yes DVT prophylaxis: Heparin subQ GI prophylaxis: PPI Glucose control: see above Mobility: BR Code Status: full code Family Communication: Husband updated at bedside 7/1 Disposition:  Awaiting LTACH bed, some issues on family preference. Can transfer to [facility]. We will follow for vent, trach management.   Critical care time: NA   Marshell Garfinkel MD [facility] Please see [email or web address] for pager details.  01/15/2020, 8:12 AM

## 2020-01-15 NOTE — Progress Notes (Signed)
[facility] Progress Note    Assessment/ Plan:    Acute kidney injury, oliguric ischemic ATN secondary to hemorrhagic shock requiring pressor support.  Refractory to IV diuretics initiated on CRRT 12/26/2019- 6/20.  First IHD 6/21. IHD  6/23, 6/25, no signs of renal recovery and not much output with Lasix challenge 6/24.  S/p RIJ tunneled dialysis catheter with IR on 6/29.  - Assess dialysis needs daily - anticipate possible treatment on 7/2 (has been on HD per MWF schedule).  She has lower inter-dialytic Cr rise but no urine output/resp status as below.  At latest would postpone HD to 7/3 but only if clinically stable. - potassium 20 meq per tube once now - Please note that in her current state she is a poor long term candidate for hemodialysis - Strict ins/outs   L sided hemothorax: s/p VATS with thoracic surgery 6/6.  S/p removal of one chest tube 6/21.     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  Had new ICH 6/6.    Acute hypoxic RF: per PCCM.  S/p trach 6/22, wean as tolerated    Chronic atrial fibrillation: on amiodarone and asa     HTN - UF with HD as tolerated    Seizures: anti-epileptics per neuro    Nutrition: s/p PEG with IR on 6/29   Anemia- s/p multiple pRBCs including one on 6/28 - continue to transfuse as needed.  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   Subjective:    No urine output is charted for 6/30.  Last HD on 6/30 with 1.5 kg UF.  She's been on trach collar overnight.  RR up to 40's with exertion per nursing.  She has been following commands.  nontunneled cath was removed.  Review of systems: unable to obtain secondary to trach/pt status    Objective:   BP 135/63   Pulse 72   Temp 98.7 F (37.1 C)  (Axillary)   Resp (!) 22   Ht 5' 4" (1.626 m)   Wt 60 kg   SpO2 100%   BMI 22.71 kg/m   Intake/Output Summary (Last 24 hours) at 01/15/2020 0536 Last data filed at 01/15/2020 0500 Gross per 24 hour  Intake 1130.1 ml  Output 1500 ml  Net -369.9 ml   Weight change: -0.1 kg  Physical Exam:  Gen: lying in bed in NAD  HEENT: R hemicraniectomy, trach in place CVS: RRR Resp: clear bilaterally anteriorly; occ rhonchi clears with cough Abd: soft non distended Ext: no pitting LE edema; 2+ edema LUE and no RUE edema Neuro - she does show her right thumb to me to command; doesn't replicate with left; opens/closes eyes to command ACCESS: R IJ tunneled HD cath  Imaging: IR GASTROSTOMY TUBE MOD SED  Result Date: 01/13/2020 INDICATION: 64 year old female with acute stroke, referred for gastrostomy EXAM: PERC PLACEMENT GASTROSTOMY MEDICATIONS: 1 g Ancef; Antibiotics were administered within 1 hour of the procedure. ANESTHESIA/SEDATION: Versed 0.5 mg IV; Fentanyl 50 mcg IV Moderate Sedation Time:  10 minutes The patient was continuously monitored during the procedure by the interventional radiology nurse under my direct supervision. CONTRAST:  35m OMNIPAQUE IOHEXOL 300 MG/ML SOLN - administered into the gastric lumen. FLUOROSCOPY TIME:  Fluoroscopy Time: 1 minutes 0 seconds COMPLICATIONS:  None PROCEDURE: Informed written consent was obtained from the patient and the patient's family after a thorough discussion of the procedural risks, benefits and alternatives. All questions were addressed. Maximal Sterile Barrier Technique was utilized including caps, mask, sterile gowns, sterile gloves, sterile drape, hand hygiene and skin antiseptic. A timeout was performed prior to the initiation of the procedure. The epigastrium was prepped with Betadine in a sterile fashion, and a sterile drape was applied covering the operative field. A sterile gown and sterile gloves were used for the procedure. A 5-French  orogastric tube is placed under fluoroscopic guidance. Scout imaging of the abdomen confirms barium within the transverse colon. The stomach was distended with gas. Under fluoroscopic guidance, an 18 gauge needle was utilized to puncture the anterior wall of the body of the stomach. An Amplatz wire was advanced through the needle passing a T fastener into the lumen of the stomach. The T fastener was secured for gastropexy. A 9-French sheath was inserted. A snare was advanced through the 9-French sheath. A Britta Mccreedy was advanced through the orogastric tube. It was snared then pulled out the oral cavity, pulling the snare, as well. The leading edge of the gastrostomy was attached to the snare. It was then pulled down the esophagus and out the percutaneous site. Tube secured in place. Contrast was injected. Patient tolerated the procedure well and remained hemodynamically stable throughout. No complications were encountered and no significant blood loss encountered. IMPRESSION: Status post fluoroscopic placed percutaneous gastrostomy tube, with 20 [place] pull-through. Signed, Dulcy Fanny. Earleen Newport, DO [facility] Radiology Electronically Signed   By: Corrie Mckusick D.O.   On: 01/13/2020 17:18   IR Fluoro Guide CV Line Right  Result Date: 01/13/2020 INDICATION: 64 year old female, referred for tunneled hemodialysis catheter placement. EXAM: TUNNELED CENTRAL VENOUS HEMODIALYSIS CATHETER PLACEMENT WITH ULTRASOUND AND FLUOROSCOPIC GUIDANCE MEDICATIONS: 1 g Ancef. The antibiotic was given in an appropriate time interval prior to skin puncture. ANESTHESIA/SEDATION: Moderate (conscious) sedation was employed during this procedure. A total of Versed 0.5 mg and Fentanyl 50 mcg was administered intravenously. Moderate Sedation Time: 15 minutes. The patient's level of consciousness and vital signs were monitored continuously by radiology nursing throughout the procedure under my direct  supervision. FLUOROSCOPY TIME:  Fluoroscopy Time: 0 minutes 6 seconds (1 mGy). COMPLICATIONS: None PROCEDURE: Informed written consent was obtained from the patient after a discussion of the risks, benefits, and alternatives to treatment. Questions regarding the procedure were encouraged and answered. The right neck and chest were prepped with chlorhexidine in a sterile fashion, and a sterile drape was applied covering the operative field. Maximum barrier sterile technique with sterile gowns and gloves were used for the procedure. A timeout was performed prior to the initiation of the procedure. Ultrasound survey was performed. Micropuncture kit was utilized to access the right internal jugular vein under direct, real-time ultrasound guidance after the overlying soft tissues were anesthetized with 1% lidocaine with epinephrine. Stab incision was made with 11 blade scalpel. Microwire was passed centrally. The microwire was then marked to measure appropriate internal catheter length. External tunneled length was estimated. A total tip to cuff length of 19 cm was selected. 035 guidewire was advanced to the level of the IVC. Skin and subcutaneous tissues of chest wall below the clavicle, lateral to the pacing device were generously infiltrated with 1% lidocaine for local anesthesia. A small stab incision was made with 11 blade scalpel. The selected hemodialysis catheter was tunneled in a retrograde fashion from  the anterior chest wall to the venotomy incision. Serial dilation was performed and then a peel-away sheath was placed. The catheter was then placed through the peel-away sheath with tips ultimately positioned within the superior aspect of the right atrium. Final catheter positioning was confirmed and documented with a spot radiographic image. The catheter aspirates and flushes normally. The catheter was flushed with appropriate volume heparin dwells. The catheter exit site was secured with a 0-Prolene retention  suture. Gel-Foam slurry was infused into the soft tissue tract. The venotomy incision was closed Derma bond and sterile dressing. Dressings were applied at the chest wall. Patient tolerated the procedure well and remained hemodynamically stable throughout. No complications were encountered and no significant blood loss encountered. IMPRESSION: Status post right IJ tunneled hemodialysis catheter. Catheter ready for use. Signed, Dulcy Fanny. Dellia Nims, RPVI [facility] Radiology Electronically Signed   By: Corrie Mckusick D.O.   On: 01/13/2020 17:17   IR US Guide Vasc Access Right  Result Date: 01/13/2020 INDICATION: 64 year old female, referred for tunneled hemodialysis catheter placement. EXAM: TUNNELED CENTRAL VENOUS HEMODIALYSIS CATHETER PLACEMENT WITH ULTRASOUND AND FLUOROSCOPIC GUIDANCE MEDICATIONS: 1 g Ancef. The antibiotic was given in an appropriate time interval prior to skin puncture. ANESTHESIA/SEDATION: Moderate (conscious) sedation was employed during this procedure. A total of Versed 0.5 mg and Fentanyl 50 mcg was administered intravenously. Moderate Sedation Time: 15 minutes. The patient's level of consciousness and vital signs were monitored continuously by radiology nursing throughout the procedure under my direct supervision. FLUOROSCOPY TIME:  Fluoroscopy Time: 0 minutes 6 seconds (1 mGy). COMPLICATIONS: None PROCEDURE: Informed written consent was obtained from the patient after a discussion of the risks, benefits, and alternatives to treatment. Questions regarding the procedure were encouraged and answered. The right neck and chest were prepped with chlorhexidine in a sterile fashion, and a sterile drape was applied covering the operative field. Maximum barrier sterile technique with sterile gowns and gloves were used for the procedure. A timeout was performed prior to the initiation of the procedure. Ultrasound survey was performed. Micropuncture  kit was utilized to access the right internal jugular vein under direct, real-time ultrasound guidance after the overlying soft tissues were anesthetized with 1% lidocaine with epinephrine. Stab incision was made with 11 blade scalpel. Microwire was passed centrally. The microwire was then marked to measure appropriate internal catheter length. External tunneled length was estimated. A total tip to cuff length of 19 cm was selected. 035 guidewire was advanced to the level of the IVC. Skin and subcutaneous tissues of chest wall below the clavicle, lateral to the pacing device were generously infiltrated with 1% lidocaine for local anesthesia. A small stab incision was made with 11 blade scalpel. The selected hemodialysis catheter was tunneled in a retrograde fashion from the anterior chest wall to the venotomy incision. Serial dilation was performed and then a peel-away sheath was placed. The catheter was then placed through the peel-away sheath with tips ultimately positioned within the superior aspect of the right atrium. Final catheter positioning was confirmed and documented with a spot radiographic image. The catheter aspirates and flushes normally. The catheter was flushed with appropriate volume heparin dwells. The catheter exit site was secured with a 0-Prolene retention suture. Gel-Foam slurry was infused into the soft tissue tract. The venotomy incision was closed Derma bond and sterile dressing. Dressings were applied at the chest wall. Patient tolerated the procedure well and remained hemodynamically stable throughout. No complications were encountered and no significant blood  loss encountered. IMPRESSION: Status post right IJ tunneled hemodialysis catheter. Catheter ready for use. Signed, Dulcy Fanny. Dellia Nims, RPVI [facility] Radiology Electronically Signed   By: Corrie Mckusick D.O.   On: 01/13/2020 17:17    Labs: BMET Recent Labs  Lab 01/09/20 0443  01/10/20 0340 01/11/20 0330 01/12/20 0322 01/13/20 0440 01/14/20 0703 01/15/20 0330  NA 136 135 134* 135 136 133* 134*  K 5.1 4.2 4.4 5.0 4.3 4.9 3.1*  CL 97* 95* 95* 96* 98 95* 99  CO2 _0 GLUCOSE 109* 162* 141* 155* 55* 117* 141*  BUN 65* 39* 65* 89* 45* 50* 35*  CREATININE 3.86* 2.47* 3.71* 4.43* 2.68* 3.51* 2.69*  CALCIUM 7.8* 7.6* 7.7* 8.0* 8.0* 8.0* 7.9*  PHOS 6.7* 4.7* 4.8* 5.5*  --  5.1* 3.9   CBC Recent Labs  Lab 01/12/20 0611 01/13/20 0440 01/14/20 0357 01/15/20 0330  WBC 9.5 11.6* 12.9* 11.3*  NEUTROABS  --   --   --  8.9*  HGB 6.9* 8.5* 8.9* 8.7*  HCT 23.1* 27.9* 28.6* 28.6*  MCV 100.4* 97.2 97.6 97.9  PLT 210 225 232 199    Medications:    . amantadine  100 mg Per Tube BID  . amiodarone  200 mg Per Tube Daily  . aspirin  325 mg Per Tube Daily  . atorvastatin  40 mg Per Tube Daily  . chlorhexidine gluconate (MEDLINE KIT)  15 mL Mouth Rinse BID  . Chlorhexidine Gluconate Cloth  6 each Topical Q0600  . Chlorhexidine Gluconate Cloth  6 each Topical Q0600  . darbepoetin (ARANESP) injection - DIALYSIS  100 mcg Intravenous Q Fri-HD  . docusate  100 mg Per Tube BID  . feeding supplement (PRO-STAT SUGAR FREE 64)  30 mL Per Tube Daily  . Gerhardt's butt cream   Topical TID  . insulin aspart  0-20 Units Subcutaneous Q4H  . mouth rinse  15 mL Mouth Rinse 10 times per day  . multivitamin  1 tablet Per Tube QHS  . neomycin-bacitracin-polymyxin  1 application Topical Daily  . pantoprazole sodium  40 mg Per Tube QHS  . phenytoin (DILANTIN) IV  100 mg Intravenous Q8H  . polyethylene glycol  17 g Per Tube TID  . propofol  500 mg Intravenous Once  . senna-docusate  1 tablet Per Tube BID  . sodium chloride flush  10-40 mL Intracatheter Q12H     Claudia Desanctis, MD 01/15/2020, 5:50 AM

## 2020-01-15 DEATH — deceased

## 2020-01-16 ENCOUNTER — Inpatient Hospital Stay (HOSPITAL_COMMUNITY): Payer: Medicare Other

## 2020-01-16 LAB — CBC
HCT: 27.9 % — ABNORMAL LOW (ref 36.0–46.0)
Hemoglobin: 8.4 g/dL — ABNORMAL LOW (ref 12.0–15.0)
MCH: 29.2 pg (ref 26.0–34.0)
MCHC: 30.1 g/dL (ref 30.0–36.0)
MCV: 96.9 fL (ref 80.0–100.0)
Platelets: 168 10*3/uL (ref 150–400)
RBC: 2.88 MIL/uL — ABNORMAL LOW (ref 3.87–5.11)
RDW: 19.7 % — ABNORMAL HIGH (ref 11.5–15.5)
WBC: 11.5 10*3/uL — ABNORMAL HIGH (ref 4.0–10.5)
nRBC: 0 % (ref 0.0–0.2)

## 2020-01-16 LAB — GLUCOSE, CAPILLARY
Glucose-Capillary: 155 mg/dL — ABNORMAL HIGH (ref 70–99)
Glucose-Capillary: 132 mg/dL — ABNORMAL HIGH (ref 70–99)
Glucose-Capillary: 116 mg/dL — ABNORMAL HIGH (ref 70–99)
Glucose-Capillary: 186 mg/dL — ABNORMAL HIGH (ref 70–99)
Glucose-Capillary: 136 mg/dL — ABNORMAL HIGH (ref 70–99)

## 2020-01-16 LAB — RENAL FUNCTION PANEL
Albumin: 1.4 g/dL — ABNORMAL LOW (ref 3.5–5.0)
Anion gap: 14 (ref 5–15)
BUN: 57 mg/dL — ABNORMAL HIGH (ref 8–23)
CO2: 23 mmol/L (ref 22–32)
Calcium: 8 mg/dL — ABNORMAL LOW (ref 8.9–10.3)
Chloride: 94 mmol/L — ABNORMAL LOW (ref 98–111)
Creatinine, Ser: 3.82 mg/dL — ABNORMAL HIGH (ref 0.44–1.00)
GFR calc Af Amer: 14 mL/min — ABNORMAL LOW (ref >60)
GFR calc non Af Amer: 12 mL/min — ABNORMAL LOW (ref >60)
Glucose, Bld: 181 mg/dL — ABNORMAL HIGH (ref 70–99)
Phosphorus: 4.9 mg/dL — ABNORMAL HIGH (ref 2.5–4.6)
Potassium: 3.7 mmol/L (ref 3.5–5.1)
Sodium: 131 mmol/L — ABNORMAL LOW (ref 135–145)

## 2020-01-16 LAB — MAGNESIUM: Magnesium: 2.5 mg/dL — ABNORMAL HIGH (ref 1.7–2.4)

## 2020-01-16 MED ORDER — DARBEPOETIN ALFA 100 MCG/0.5ML IJ SOSY
PREFILLED_SYRINGE | INTRAMUSCULAR | Status: AC
  Administered 2020-01-16: 100 ug via INTRAVENOUS
  Filled 2020-01-16: qty 0.5

## 2020-01-16 MED ORDER — HEPARIN SODIUM (PORCINE) 1000 UNIT/ML IJ SOLN
INTRAMUSCULAR | Status: AC
  Administered 2020-01-16: 3200 [IU] via INTRAVENOUS_CENTRAL
  Filled 2020-01-16: qty 4

## 2020-01-16 NOTE — Progress Notes (Signed)
Patient ID: Krystal Little, female   DOB: 1956/06/27, 64 y.o.   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 On 12/13/2019, PCCM was consulted for tachypnea and enlarging left-sided pleural effusion; thoracentesis showed a hemothorax. CT of the chest showed obstruction of her left mainstem bronchus and loculated left pleural effusion. She was admitted to ICU under PCCM service on 11/27/2019 for chest tube placement and bronchoscopy. Underwent intrapleural TPA but required VATS eventually. Course complicated further by ICH, seizures and AKI requiring CRRT. Patient subsequently had tracheostomy placed on 01/11/2020 NG tube and tunneled vascular catheter placed on 01/13/2020. Subsequently she was started on intermittent hemodialysis.  Significant Hospital events 4/16 IR thrombectomy R MCA stroke 5/6 transfer to [facility] 5/29 PCCM consult left pleural effusion, thoracentesis 5/30 worsening tachypnea. Moved to ICU for intubation to facilitate bronchoscopy and chest tube placement 5/31 TPA/ pulmonzyme started for 3 days for persistent left effusion 6/01 Afebrile, low-dose Levophed, Precedex, Minimal output on chest tube- pulmozyme / tpa x 2,  6/02 pulmozyme and TPA completed 3 days 6/03 afib with RVR placed on amio gtt overnight , 1U PRBC for Hb 7.1  6/05 extubated successfully, Repeat TPA/Pulmonzyme given again  6/06 acute decompensation overnight, hemorrhagic shock > to OR for left VATS with 2L hemothorax drained.   CT head with new ICH> neurosurgery consulted 6/09 More awake but agitated on PSV weaning  6/12: Seizures + on EEG  6/22 trach 6/29 g tube and tunneled vascular catheter placed 7/2: Care transferred to [place]:   Acute respiratory failure with hypoxia status post tracheostomy Mucous plugging, suspected aspiration Left-sided loculated effusion complicated by hemothorax status post VATS/decortication-Patient has had a long hospitalization and was intubated but subsequently underwent tracheostomy on 01/06/2020. -PCCM following. Continue trach collar. Still requiring vent at night. Wean off as able. -Patient had VATS/decortication with chest tube placement and subsequent removal -Aspiration precautions. Patient already has completed antibiotic treatment  AKI, oliguric ischemic ATN secondary to hemorrhagic shock -Refractory to IV diuretics and initiated required CRRT from 12/26/2019-01/04/2020. Subsequently started hemodialysis from 01/05/2020. -Status post right IJ tunneled dialysis catheter placement by IR on 01/13/2020. Nephrology following and managing dialysis. 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 Continue phenytoin via G-tube. Change Keppra to G-tube as well. Continue Ativan as needed. Outpatient follow-up with neurology  Chronic atrial fibrillation  History of sick sinus syndrome status post pacemaker -Continue amiodarone. Continue  aspirin  Moderate protein calorie malnutrition -Continue tube feeding per nutrition  Diabetes mellitus type 2 with hyperglycemia and hypoglycemia -Continue CBGs with SSI.  Anemia of chronic disease -From chronic illnesses. Hemoglobin stable. Status  post multiple PRBCs transfusion. Transfuse if hemoglobin is less than 7  Hypertension -Monitor blood pressure.  Hyponatremia--monitor.  Generalized deconditioning Overall very poor prognosis -Patient is very deconditioned and has very poor prognosis. She should be appropriate for hospice/comfort measures per family wants full scope of treatment. She is currently full code. Palliative care follow-up appreciated; palliative care has signed off. -We will need LTACH placement probably   DVT prophylaxis: SCDs Code Status: Full Family Communication: None at bedside  disposition Plan: Status is: Inpatient  Remains inpatient appropriate because:Inpatient level of care appropriate due to severity of illness   Dispo: The patient is from: Home              Anticipated d/c is to: LTAC              Anticipated d/c date is: 1 day              Patient currently is not medically stable to d/c.   Consultants: PCCM/palliative care/nephrology/neurology/neurosurgery/IR  Procedures:  12/13/2019 thoracentesis: 1.5 L bloody fluid Bronchoscopy on 11/18/2019 ETT: 11/27/2019-01/06/2020 Tracheostomy: 01/06/2020 onwards Left pigtail chest tube 12/08/2019-12/16/2019 [facility] HD cath from 12/27/2019-01/13/2020 G-tube and tunneled vascular catheter placement on 01/13/2020  Overnight EEG on 12/27/2019: Right frontocentral seizures x2  Antimicrobials: Cefepime 4/24 >> 4/25 Zosyn 4/28 >> 5/4 Zosyn 5/30 >> 6/5 Cefepime 6/6 >> 6/12   Subjective: Patient seen and examined at bedside. She is awake, hardly answers any questions. Currently undergoing dialysis. No overnight fever or vomiting reported.  Objective: Vitals:   01/16/20 1015 01/16/20 1030 01/16/20 1045  01/16/20 1100  BP: (!) 155/68 (!) 153/69 (!) 152/69 (!) 143/70  Pulse: 95 85 86 85  Resp: (!) 30 (!) 33 (!) 34 (!) 36  Temp:      TempSrc:      SpO2: 94% 93% 93% 94%  Weight:      Height:        Intake/Output Summary (Last 24 hours) at 01/16/2020 1132 Last data filed at 01/16/2020 0800 Gross per 24 hour  Intake 1762.53 ml  Output 250 ml  Net 1512.53 ml   Filed Weights   01/15/20 0500 01/16/20 0500 01/16/20 0737  Weight: 60 kg 62.6 kg 64.5 kg    Examination:  General exam: Appears calm and comfortable. Awake, alert answers any questions. Poor historian. Looks very deconditioned and chronically ill ENT: Trach present Respiratory system: Bilateral decreased breath sounds at bases with scattered crackles. Tachypneic Cardiovascular system: S1 & S2 heard, Rate controlled Gastrointestinal system: Abdomen is nondistended, soft and nontender. G-tube present. Normal bowel sounds heard. Extremities: No cyanosis, clubbing; bilateral lower extremity edema present Central nervous system: Awake, does not respond to questions. Skin: No obvious ecchymosis/rash  psychiatry: Could not be assessed because of mental status    Data Reviewed: I have personally reviewed following labs and imaging studies  CBC: Recent Labs  Lab 01/12/20 0611 01/13/20 0440 01/14/20 0357 01/15/20 0330 01/16/20 0504  WBC 9.5 11.6* 12.9* 11.3* 11.5*  NEUTROABS  --   --   --  8.9*  --   HGB 6.9* 8.5* 8.9* 8.7* 8.4*  HCT 23.1* 27.9* 28.6* 28.6* 27.9*  MCV 100.4* 97.2 97.6 97.9 96.9  PLT 210 225 232 199 569   Basic Metabolic Panel: Recent Labs  Lab 01/10/20 0340 01/10/20 0340 01/11/20 0330 01/11/20 0330 01/12/20 0322 01/13/20 0440 01/14/20 0703 01/15/20 0330 01/16/20 0504  NA 135   < > 134*   < > 135 136 133* 134* 131*  K 4.2   < > 4.4   < > 5.0 4.3 4.9 3.1* 3.7  CL 95*   < > 95*   < > 96* 98 95* 99 94*  CO2 27   < > 26   < > _0 GLUCOSE 162*   < > 141*   < > 155* 55* 117* 141* 181*   BUN 39*   < > 65*   < > 89* 45* 50* 35* 57*  CREATININE 2.47*   < > 3.71*   < > 4.43* 2.68* 3.51* 2.69* 3.82*  CALCIUM 7.6*   < > 7.7*   < > 8.0* 8.0* 8.0* 7.9* 8.0*  MG 2.4  --  2.6*  --  2.8*  --   --   --  2.5*  PHOS 4.7*   < > 4.8*  --  5.5*  --  5.1* 3.9 4.9*   < > = values in this interval not displayed.   GFR: Estimated Creatinine Clearance: 12.7 mL/min (A) (by C-G formula based on SCr of 3.82 mg/dL (H)). Liver Function Tests: Recent Labs  Lab 01/11/20 0955 01/14/20 0703 01/15/20 0330 01/16/20 0504  ALBUMIN 1.5* 1.4* 1.3* 1.4*   No results for input(s): LIPASE, AMYLASE in the last 168 hours. No results for input(s): AMMONIA in the last 168 hours. Coagulation Profile: No results for input(s): INR, PROTIME in the last 168 hours. Cardiac Enzymes: No results for input(s): CKTOTAL, CKMB, CKMBINDEX, TROPONINI in the last 168 hours. BNP (last 3 results) No results for input(s): PROBNP in the last 8760 hours. HbA1C: No results for input(s): HGBA1C in the last 72 hours. CBG: Recent Labs  Lab 01/15/20 1542 01/15/20 1951 01/15/20 2324 01/16/20 0410 01/16/20 0756  GLUCAP 127* 161* 117* 155* 132*   Lipid Profile: No results for input(s): CHOL, HDL, LDLCALC, TRIG, CHOLHDL, LDLDIRECT in the last 72 hours. Thyroid Function Tests: No results for input(s): TSH, T4TOTAL, FREET4, T3FREE, THYROIDAB in the last 72 hours. Anemia Panel: No results for input(s): VITAMINB12, FOLATE, FERRITIN, TIBC, IRON, RETICCTPCT in the last 72 hours. Sepsis Labs: No results for input(s): PROCALCITON, LATICACIDVEN in the last 168 hours.  Recent Results (from the past 240 hour(s))  Culture, respiratory     Status: None (Preliminary result)   Collection Time: 01/14/20 12:46 PM   Specimen: Tracheal Aspirate; Respiratory  Result Value Ref Range Status   Specimen Description TRACHEAL ASPIRATE  Final   Special Requests NONE  Final   Gram Stain   Final    NO WBC SEEN NO ORGANISMS SEEN Performed at  [facility], [address], [place], [place] [address]    Culture FEW STAPHYLOCOCCUS AUREUS  Final   Report Status PENDING  Incomplete         Radiology Studies: DG Chest Port 1 View  Result Date: 01/16/2020 CLINICAL DATA:  Acute respiratory failure. EXAM: PORTABLE CHEST 1 VIEW COMPARISON:  02/11/2020 01/08/2020. FINDINGS: Interim removal of feeding tube. Interim placement of right-sided dual-lumen catheter, its tip is at cavoatrial junction. Tracheostomy tube and left PICC line in stable position. Cardiac pacer stable position. Prior median sternotomy. Cardiomegaly. Progressive bilateral pulmonary infiltrates/edema and bilateral pleural effusions. CHF could present this fashion. Stable left upper lateral pleural thickening noted. Surgical staples left chest. IMPRESSION: 1. Interim removal of feeding tube. Interim placement of right-sided dual-lumen catheter, its tip is at the cavoatrial junction. Tracheostomy tube and left PICC line stable position. 2. Cardiac pacer and stable position. Prior  median sternotomy. Cardiomegaly. 3. Progressive bilateral pulmonary infiltrates/edema bilateral pleural effusions. Findings suggest CHF. 4. Stable left upper lateral pleural thickening. Small loculated effusion cannot be excluded. Electronically Signed   By: Marcello Moores  Register   On: 01/16/2020 06:21        Scheduled Meds: . amantadine  100 mg Per Tube BID  . amiodarone  200 mg Per Tube Daily  . aspirin  325 mg Per Tube Daily  . atorvastatin  40 mg Per Tube Daily  . chlorhexidine gluconate (MEDLINE KIT)  15 mL Mouth Rinse BID  . Chlorhexidine Gluconate Cloth  6 each Topical Q0600  . darbepoetin (ARANESP) injection - DIALYSIS  100 mcg Intravenous Q Fri-HD  . docusate  100 mg Per Tube BID  . feeding supplement (PRO-STAT SUGAR FREE 64)  30 mL Per Tube Daily  . Gerhardt's butt cream   Topical TID  . insulin aspart  0-20 Units Subcutaneous Q4H  . mouth rinse  15 mL Mouth Rinse 10 times per day   . multivitamin  1 tablet Per Tube QHS  . neomycin-bacitracin-polymyxin  1 application Topical Daily  . pantoprazole sodium  40 mg Per Tube QHS  . phenytoin  100 mg Per Tube Q8H  . polyethylene glycol  17 g Per Tube TID  . senna-docusate  1 tablet Per Tube BID  . sodium chloride flush  10-40 mL Intracatheter Q12H   Continuous Infusions: . sodium chloride Stopped (12/30/19 2139)  . sodium chloride    . sodium chloride    .  ceFAZolin (ANCEF) IV    . dextrose Stopped (01/14/20 1609)  . feeding supplement (NEPRO CARB STEADY) 1,000 mL (01/15/20 1252)  . levETIRAcetam 750 mg (01/15/20 2116)          Aline August, MD [facility] 01/16/2020, 11:32 AM

## 2020-01-16 NOTE — Progress Notes (Signed)
[facility] Progress Note    Assessment/ Plan:    Acute kidney injury, oliguric ischemic ATN secondary to hemorrhagic shock requiring pressor support.  Refractory to IV diuretics initiated on CRRT 12/26/2019- 6/20.  First IHD 6/21. IHD  6/23, 6/25, no signs of renal recovery and not much output with Lasix challenge 6/24.  S/p RIJ tunneled dialysis catheter with IR on 6/29.  - HD today (7/2) per MWF schedule as needed.  Lower inter-dialytic rise but low urine output and tachypnea with attempting vent wean.  Assess dialysis needs daily  - Will follow renal panel daily - Please note that in her current state she is a poor long term candidate for hemodialysis - vent wean per primary team  - Strict ins/outs   L sided hemothorax: s/p VATS with thoracic surgery 6/6.  S/p removal of one chest tube 6/21.     R MCA stroke: s/p thrombectomy 4/16 complicated by worsening cerebral edema with mass effect s/p decompressive right hemicraniectomy and placement of bone flap on 11/02/19.  Had new ICH 6/6.    Acute hypoxic RF: per PCCM.  S/p trach 6/22, wean as tolerated per primary team     Chronic atrial fibrillation: on amiodarone and asa     HTN - UF with HD as tolerated    Seizures: anti-epileptics per neuro    Nutrition: s/p PEG with IR on 6/29   Anemia- s/p multiple pRBCs including one on 6/28 - continue to transfuse as needed.  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   Subjective:    One unmeasured void is charted for 7/1.  Last HD on 6/30 with 1.5 kg UF.  She's been charted as quite tachypneic overnight though per nursing she had appeared comfortable.  Has been on a trach collar over 24 hours per nursing.  Review of systems: unable to obtain secondary to trach/pt status    Objective:   BP (!) 158/62   Pulse 63   Temp 98.7 F (37.1 C) (Oral)   Resp (!) 31    Ht 5' 4" (1.626 m)   Wt 62.6 kg   SpO2 96%   BMI 23.69 kg/m   Intake/Output Summary (Last 24 hours) at 01/16/2020 0538 Last data filed at 01/16/2020 0400 Gross per 24 hour  Intake 1582.53 ml  Output --  Net 1582.53 ml   Weight change: 0.6 kg  Physical Exam:  Gen: lying in bed in NAD  HEENT: R hemicraniectomy, trach in place CVS: RRR Resp: crackles and occ rhonchi Abd: soft non distended Ext: no pitting LE edema; 2+ edema LUE and no RUE edema Neuro - she show her right thumb to me to command; doesn't replicate with left as per her recent baseline ACCESS: R IJ tunneled HD cath  Imaging: No results found.  Labs: BMET Recent Labs  Lab 01/10/20 0340 01/11/20 0330 01/12/20 0322 01/13/20 0440 01/14/20 0703 01/15/20 0330  NA 135 134* 135 136 133* 134*  K 4.2 4.4 5.0 4.3 4.9 3.1*  CL 95* 95* 96* 98 95* 99  CO2 27 26 24 27 25 28  GLUCOSE 162* 141* 155* 55* 117* 141*  BUN 39* 65* 89* 45* 50* 35*  CREATININE 2.47* 3.71* 4.43* 2.68* 3.51* 2.69*  CALCIUM 7.6* 7.7* 8.0* 8.0* 8.0* 7.9*  PHOS 4.7* 4.8* 5.5*  --  5.1* 3.9   CBC Recent Labs    Lab 01/13/20 0440 01/14/20 0357 01/15/20 0330 01/16/20 0504  WBC 11.6* 12.9* 11.3* 11.5*  NEUTROABS  --   --  8.9*  --   HGB 8.5* 8.9* 8.7* 8.4*  HCT 27.9* 28.6* 28.6* 27.9*  MCV 97.2 97.6 97.9 96.9  PLT 225 232 199 168    Medications:    . amantadine  100 mg Per Tube BID  . amiodarone  200 mg Per Tube Daily  . aspirin  325 mg Per Tube Daily  . atorvastatin  40 mg Per Tube Daily  . chlorhexidine gluconate (MEDLINE KIT)  15 mL Mouth Rinse BID  . Chlorhexidine Gluconate Cloth  6 each Topical Q0600  . Chlorhexidine Gluconate Cloth  6 each Topical Q0600  . Chlorhexidine Gluconate Cloth  6 each Topical Q0600  . darbepoetin (ARANESP) injection - DIALYSIS  100 mcg Intravenous Q Fri-HD  . docusate  100 mg Per Tube BID  . feeding supplement (PRO-STAT SUGAR FREE 64)  30 mL Per Tube Daily  . Gerhardt's butt cream   Topical TID  .  insulin aspart  0-20 Units Subcutaneous Q4H  . mouth rinse  15 mL Mouth Rinse 10 times per day  . multivitamin  1 tablet Per Tube QHS  . neomycin-bacitracin-polymyxin  1 application Topical Daily  . pantoprazole sodium  40 mg Per Tube QHS  . phenytoin  100 mg Per Tube Q8H  . polyethylene glycol  17 g Per Tube TID  . senna-docusate  1 tablet Per Tube BID  . sodium chloride flush  10-40 mL Intracatheter Q12H     Lori C Foster, MD 01/16/2020, 5:51 AM   

## 2020-01-16 NOTE — Progress Notes (Signed)
  Speech Language Pathology Treatment: Nada Boozer Speaking valve  Patient Details Name: Krystal Little MRN: [id number] DOB: 18-Mar-1956 Today's Date: 01/16/2020 Time: 9767-3419 SLP Time Calculation (min) (ACUTE ONLY): 23 min  Assessment / Plan / Recommendation Clinical Impression  Pt was seen for treatment and was cooperative throughout it. She was significantly more alert than during the initial evalaution. Vitals were RR 27, SpO2 97% and HR 76 at baseline and RR 26, SpO2 96% and HR 76 following PMSV application. Pt's cuff was deflated at baseline and she tolerated finger occlusion without evidence of back pressure. Pt tolerated PMSV placement for 5 minutes and verbalized with improved vocal intensity; however, a hoarse vocal quality was still noted and these together negatively impacted speech intelligibility. She was able to cough and mobilize secretions. However, she exhibited difficulty directing them to the oral cavity and ultimately requested tracheal suctioning which was provided by [facility], Therapist, sports. The PMSV was ultimately removed due to pt's intermittent increase in RR to the high 30s and low 40s.as well as increased work of breathing and fatigue. SLP will continue to follow pt and attempt to see her on days when she does not have HD.    HPI HPI: Pt is a 64 y.o. female presented to [facility] ED as a code stroke for R gaze deviation and L-sided weakness. CT revealed a large R MCA infarct. Pt s/p R hemicraniectomy with bone flap placed in abdomen on 4/18. ETT 4/18-4/25. PMH HTN, DM2, chronic a. Fib (coumadin) s/p pace maker. Pt admitted to [facility] [place] 11/20/19.  Pulmonary and critical care medicine was consulted on May 29 in the setting of tachypnea and an enlarging left-sided pleural effusion. Thoracentesis was performed which showed a hemothorax.  A CT chest was then ordered showing obstruction of her left mainstem bronchus and a loculated left pleural effusion.  On May 30 she was moved to the ICU for chest tube  placement and bronchoscopy. Pt was re-intubated from 11/19/2019-12/19/19. Most recent MBS on 12/03/19 recommended Dysphagia 2 solids and thin liquids. Right frontocentral seizures seizures noted on EEG 6/12. Trach 6/22. G-tube and tunnel cath on 6/29.       SLP Plan  Continue with current plan of care       Recommendations         Patient may use Passy-Muir Speech Valve: with SLP only PMSV Supervision: Full         Follow up Recommendations: LTACH SLP Visit Diagnosis: Aphonia (R49.1) Plan: Continue with current plan of care       Jeanmarie Mccowen I. [name], [place], [place] Office number [phone] Pager [place] 01/16/2020, 4:50 PM

## 2020-01-16 NOTE — Progress Notes (Signed)
SLP Cancellation Note  Patient Details Name: Krystal Little MRN: [id number] DOB: October 15, 1955   Cancelled treatment:       Reason Eval/Treat Not Completed: Patient at procedure or test/unavailable (Pt having HD at this time. SLP will f/u)  Tobie Poet I. [name], [place], [place] Office number [phone] Pager [place] 01/16/2020, 10:47 AM

## 2020-01-16 NOTE — Procedures (Signed)
Seen and examined on dialysis.  Procedure supervised.  Blood pressure 158/63.  Tunneled catheter in use.  Tolerating goal.  Increased goal by 0.5 kg as tolerated.  She does have pitting pedal edema.   Claudia Desanctis, MD 01/16/2020  9:26 AM

## 2020-01-16 NOTE — Progress Notes (Signed)
HD tx completed, no issues noted. UF=3.5 liters. Pt stable.

## 2020-01-16 NOTE — Progress Notes (Signed)
PT Cancellation Note  Patient Details Name: Krystal Little MRN: [id number] DOB: 11/12/1955   Cancelled Treatment:    Reason Eval/Treat Not Completed: Patient at procedure or test/unavailable (HD)   [name] [name] 01/16/2020, 8:36 AM  Bayard Males, PT Acute Rehabilitation Services Pager: [phone] Office: [phone]

## 2020-01-17 LAB — CBC WITH DIFFERENTIAL/PLATELET
Abs Immature Granulocytes: 0.09 10*3/uL — ABNORMAL HIGH (ref 0.00–0.07)
Basophils Absolute: 0.1 10*3/uL (ref 0.0–0.1)
Basophils Relative: 0 %
Eosinophils Absolute: 0.1 10*3/uL (ref 0.0–0.5)
Eosinophils Relative: 1 %
HCT: 29.3 % — ABNORMAL LOW (ref 36.0–46.0)
Hemoglobin: 8.9 g/dL — ABNORMAL LOW (ref 12.0–15.0)
Immature Granulocytes: 1 %
Lymphocytes Relative: 10 %
Lymphs Abs: 1.3 10*3/uL (ref 0.7–4.0)
MCH: 29.8 pg (ref 26.0–34.0)
MCHC: 30.4 g/dL (ref 30.0–36.0)
MCV: 98 fL (ref 80.0–100.0)
Monocytes Absolute: 1.1 10*3/uL — ABNORMAL HIGH (ref 0.1–1.0)
Monocytes Relative: 9 %
Neutro Abs: 10.1 10*3/uL — ABNORMAL HIGH (ref 1.7–7.7)
Neutrophils Relative %: 79 %
Platelets: 175 10*3/uL (ref 150–400)
RBC: 2.99 MIL/uL — ABNORMAL LOW (ref 3.87–5.11)
RDW: 19.9 % — ABNORMAL HIGH (ref 11.5–15.5)
WBC: 12.8 10*3/uL — ABNORMAL HIGH (ref 4.0–10.5)
nRBC: 0 % (ref 0.0–0.2)

## 2020-01-17 LAB — RENAL FUNCTION PANEL
Albumin: 1.3 g/dL — ABNORMAL LOW (ref 3.5–5.0)
Anion gap: 9 (ref 5–15)
BUN: 37 mg/dL — ABNORMAL HIGH (ref 8–23)
CO2: 26 mmol/L (ref 22–32)
Calcium: 8 mg/dL — ABNORMAL LOW (ref 8.9–10.3)
Chloride: 97 mmol/L — ABNORMAL LOW (ref 98–111)
Creatinine, Ser: 2.79 mg/dL — ABNORMAL HIGH (ref 0.44–1.00)
GFR calc Af Amer: 20 mL/min — ABNORMAL LOW (ref >60)
GFR calc non Af Amer: 17 mL/min — ABNORMAL LOW (ref >60)
Glucose, Bld: 179 mg/dL — ABNORMAL HIGH (ref 70–99)
Phosphorus: 3.2 mg/dL (ref 2.5–4.6)
Potassium: 3.8 mmol/L (ref 3.5–5.1)
Sodium: 132 mmol/L — ABNORMAL LOW (ref 135–145)

## 2020-01-17 LAB — GLUCOSE, CAPILLARY
Glucose-Capillary: 119 mg/dL — ABNORMAL HIGH (ref 70–99)
Glucose-Capillary: 126 mg/dL — ABNORMAL HIGH (ref 70–99)
Glucose-Capillary: 127 mg/dL — ABNORMAL HIGH (ref 70–99)
Glucose-Capillary: 132 mg/dL — ABNORMAL HIGH (ref 70–99)
Glucose-Capillary: 133 mg/dL — ABNORMAL HIGH (ref 70–99)
Glucose-Capillary: 157 mg/dL — ABNORMAL HIGH (ref 70–99)
Glucose-Capillary: 164 mg/dL — ABNORMAL HIGH (ref 70–99)
Glucose-Capillary: 93 mg/dL (ref 70–99)

## 2020-01-17 LAB — CULTURE, RESPIRATORY W GRAM STAIN: Gram Stain: NONE SEEN

## 2020-01-17 NOTE — Progress Notes (Signed)
Patient ID: Krystal Little, female   DOB: 1956/06/27, 64 y.o.   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 On 12/13/2019, PCCM was consulted for tachypnea and enlarging left-sided pleural effusion; thoracentesis showed a hemothorax. CT of the chest showed obstruction of her left mainstem bronchus and loculated left pleural effusion. She was admitted to ICU under PCCM service on 11/27/2019 for chest tube placement and bronchoscopy. Underwent intrapleural TPA but required VATS eventually. Course complicated further by ICH, seizures and AKI requiring CRRT. Patient subsequently had tracheostomy placed on 01/11/2020 NG tube and tunneled vascular catheter placed on 01/13/2020. Subsequently she was started on intermittent hemodialysis.  Significant Hospital events 4/16 IR thrombectomy R MCA stroke 5/6 transfer to [facility] 5/29 PCCM consult left pleural effusion, thoracentesis 5/30 worsening tachypnea. Moved to ICU for intubation to facilitate bronchoscopy and chest tube placement 5/31 TPA/ pulmonzyme started for 3 days for persistent left effusion 6/01 Afebrile, low-dose Levophed, Precedex, Minimal output on chest tube- pulmozyme / tpa x 2,  6/02 pulmozyme and TPA completed 3 days 6/03 afib with RVR placed on amio gtt overnight , 1U PRBC for Hb 7.1  6/05 extubated successfully, Repeat TPA/Pulmonzyme given again  6/06 acute decompensation overnight, hemorrhagic shock > to OR for left VATS with 2L hemothorax drained.   CT head with new ICH> neurosurgery consulted 6/09 More awake but agitated on PSV weaning  6/12: Seizures + on EEG  6/22 trach 6/29 g tube and tunneled vascular catheter placed 7/2: Care transferred to [place]:   Acute respiratory failure with hypoxia status post tracheostomy Mucous plugging, suspected aspiration Left-sided loculated effusion complicated by hemothorax status post VATS/decortication-Patient has had a long hospitalization and was intubated but subsequently underwent tracheostomy on 01/06/2020. -PCCM following. Continue trach collar. Still requiring vent at night. Wean off as able. -Patient had VATS/decortication with chest tube placement and subsequent removal -Aspiration precautions. Patient already has completed antibiotic treatment  AKI, oliguric ischemic ATN secondary to hemorrhagic shock -Refractory to IV diuretics and initiated required CRRT from 12/26/2019-01/04/2020. Subsequently started hemodialysis from 01/05/2020. -Status post right IJ tunneled dialysis catheter placement by IR on 01/13/2020. Nephrology following and managing dialysis. 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 Continue phenytoin via G-tube. Change Keppra to G-tube as well. Continue Ativan as needed. Outpatient follow-up with neurology  Chronic atrial fibrillation  History of sick sinus syndrome status post pacemaker -Continue amiodarone. Continue  aspirin  Moderate protein calorie malnutrition -Continue tube feeding per nutrition  Diabetes mellitus type 2 with hyperglycemia and hypoglycemia -Continue CBGs with SSI.  Anemia of chronic disease -From chronic illnesses. Hemoglobin stable. Status  post multiple PRBCs transfusion. Transfuse if hemoglobin is less than 7  Hypertension -Monitor blood pressure.  Hyponatremia--monitor.  Generalized deconditioning Overall very poor prognosis -Patient is very deconditioned and has very poor prognosis. She should be appropriate for hospice/comfort measures per family wants full scope of treatment. She is currently full code. Palliative care follow-up appreciated; palliative care has signed off. -will need LTACH placement probably   DVT prophylaxis: SCDs Code Status: Full Family Communication: None at bedside  disposition Plan: Status is: Inpatient  Remains inpatient appropriate because:Inpatient level of care appropriate due to severity of illness   Dispo: The patient is from: Home              Anticipated d/c is to: LTAC              Anticipated d/c date is: 1 day              Patient currently is not medically stable to d/c.   Consultants: PCCM/palliative care/nephrology/neurology/neurosurgery/IR  Procedures:  12/13/2019 thoracentesis: 1.5 L bloody fluid Bronchoscopy on 12/03/2019 ETT: 12/03/2019-01/06/2020 Tracheostomy: 01/06/2020 onwards Left pigtail chest tube 11/26/2019-12/19/2019 [facility] HD cath from 12/27/2019-01/13/2020 G-tube and tunneled vascular catheter placement on 01/13/2020  Overnight EEG on 12/27/2019: Right frontocentral seizures x2  Antimicrobials: Cefepime 4/24 >> 4/25 Zosyn 4/28 >> 5/4 Zosyn 5/30 >> 6/5 Cefepime 6/6 >> 6/12   Subjective: Patient seen and examined at bedside. She is awake, hardly answers any questions.  Extremely poor historian.  No overnight vomiting or fever reported. Objective: Vitals:   01/17/20 0400 01/17/20 0500 01/17/20 0600 01/17/20  0700  BP: 117/65 (!) 143/73 135/66 (!) 143/52  Pulse: 70 71 73 70  Resp: (!) 31 (!) 29 (!) 32 (!) 30  Temp:      TempSrc:      SpO2: 98% 96% 96% 97%  Weight:  59 kg    Height:        Intake/Output Summary (Last 24 hours) at 01/17/2020 0720 Last data filed at 01/17/2020 0400 Gross per 24 hour  Intake 895 ml  Output 3500 ml  Net -2605 ml   Filed Weights   01/16/20 0737 01/16/20 1130 01/17/20 0500  Weight: 64.5 kg 60.4 kg 59 kg    Examination:  General exam: No distress.  Awake, does not answer any questions. Poor historian. Looks very deconditioned and chronically ill ENT: Tracheostomy present Respiratory system: Bilateral decreased breath sounds at bases.  Tachypneic.  Some crackles heard  system: Rate controlled, S1-S2 heard Gastrointestinal system: Abdomen is nondistended, soft and nontender. G-tube present.  Bowel sounds are heard Extremities: Trace lower extremity edema present.  No clubbing  Central nervous system: Poor historian.  Awake, does not respond to questions  skin: No obvious lesions/petechiae  psychiatry: Cannot assess because of mental status    Data Reviewed: I have personally reviewed following labs and imaging studies  CBC: Recent Labs  Lab 01/13/20 0440 01/14/20 0357 01/15/20 0330 01/16/20 0504 01/17/20 0505  WBC 11.6* 12.9* 11.3* 11.5* 12.8*  NEUTROABS  --   --  8.9*  --  10.1*  HGB 8.5* 8.9* 8.7* 8.4* 8.9*  HCT 27.9* 28.6* 28.6* 27.9* 29.3*  MCV 97.2 97.6 97.9 96.9 98.0  PLT 225 232 199 168 751   Basic Metabolic Panel: Recent Labs  Lab 01/11/20 0330 01/11/20 0330 01/12/20 0322 01/12/20 0322 01/13/20 0440 01/14/20 0703 01/15/20 0330 01/16/20 0504 01/17/20 0505  NA 134*   < > 135   < > 136 133* 134* 131* 132*  K 4.4   < > 5.0   < > 4.3 4.9 3.1* 3.7 3.8  CL 95*   < > 96*   < > 98 95* 99 94* 97*  CO2 26   < > 24   < > _0 GLUCOSE 141*   < > 155*   < > 55* 117* 141* 181* 179*  BUN 65*   < > 89*   < > 45* 50* 35* 57* 37*   CREATININE 3.71*   < > 4.43*   < > 2.68* 3.51* 2.69* 3.82* 2.79*  CALCIUM 7.7*   < > 8.0*   < > 8.0* 8.0* 7.9* 8.0* 8.0*  MG 2.6*  --  2.8*  --   --   --   --  2.5*  --   PHOS 4.8*   < > 5.5*  --   --  5.1* 3.9 4.9* 3.2   < > = values in this interval not displayed.   GFR: Estimated Creatinine Clearance: 17.4 mL/min (A) (by C-G formula based on SCr of 2.79 mg/dL (H)). Liver Function Tests: Recent Labs  Lab 01/11/20 0955 01/14/20 0703 01/15/20 0330 01/16/20 0504 01/17/20 0505  ALBUMIN 1.5* 1.4* 1.3* 1.4* 1.3*   No results for input(s): LIPASE, AMYLASE in the last 168 hours. No results for input(s): AMMONIA in the last 168 hours. Coagulation Profile: No results for input(s): INR, PROTIME in the last 168 hours. Cardiac Enzymes: No results for input(s): CKTOTAL, CKMB, CKMBINDEX, TROPONINI in the last 168 hours. BNP (last 3 results) No results for input(s): PROBNP in the last 8760 hours. HbA1C: No results for input(s): HGBA1C in the last 72 hours. CBG: Recent Labs  Lab 01/16/20 1208 01/16/20 1615 01/16/20 2013 01/17/20 0015 01/17/20 0346  GLUCAP 116* 186* 136* 93 164*   Lipid Profile: No results for input(s): CHOL, HDL, LDLCALC, TRIG, CHOLHDL, LDLDIRECT in the last 72 hours. Thyroid Function Tests: No results for input(s): TSH, T4TOTAL, FREET4, T3FREE, THYROIDAB in the last 72 hours. Anemia Panel: No results for input(s): VITAMINB12, FOLATE, FERRITIN, TIBC, IRON, RETICCTPCT in the last 72 hours. Sepsis Labs: No results for input(s): PROCALCITON, LATICACIDVEN in the last 168 hours.  Recent Results (from the past 240 hour(s))  Culture, respiratory     Status: None (Preliminary result)   Collection Time: 01/14/20 12:46 PM   Specimen: Tracheal Aspirate; Respiratory  Result Value Ref Range Status   Specimen Description TRACHEAL ASPIRATE  Final   Special Requests NONE  Final   Gram Stain   Final    NO WBC SEEN NO ORGANISMS SEEN Performed at [facility],  [address], [place], [place] [address]    Culture FEW STAPHYLOCOCCUS AUREUS  Final   Report Status PENDING  Incomplete         Radiology Studies: DG Chest Port 1 View  Result Date: 01/16/2020 CLINICAL DATA:  Acute respiratory failure. EXAM: PORTABLE CHEST 1 VIEW COMPARISON:  02/11/2020 01/08/2020. FINDINGS: Interim removal of feeding tube. Interim placement of right-sided dual-lumen catheter, its tip is at cavoatrial junction. Tracheostomy tube and left PICC line in stable position. Cardiac pacer stable position. Prior median sternotomy. Cardiomegaly. Progressive bilateral pulmonary infiltrates/edema and bilateral pleural effusions. CHF could present this fashion. Stable left upper lateral pleural thickening noted. Surgical staples left chest. IMPRESSION: 1. Interim removal of feeding tube. Interim placement of right-sided dual-lumen catheter, its tip is at the cavoatrial junction. Tracheostomy tube and left PICC line stable position. 2. Cardiac  pacer and stable position. Prior median sternotomy. Cardiomegaly. 3. Progressive bilateral pulmonary infiltrates/edema bilateral pleural effusions. Findings suggest CHF. 4. Stable left upper lateral pleural thickening. Small loculated effusion cannot be excluded. Electronically Signed   By: Marcello Moores  Register   On: 01/16/2020 06:21        Scheduled Meds: . amantadine  100 mg Per Tube BID  . amiodarone  200 mg Per Tube Daily  . aspirin  325 mg Per Tube Daily  . atorvastatin  40 mg Per Tube Daily  . chlorhexidine gluconate (MEDLINE KIT)  15 mL Mouth Rinse BID  . Chlorhexidine Gluconate Cloth  6 each Topical Q0600  . darbepoetin (ARANESP) injection - DIALYSIS  100 mcg Intravenous Q Fri-HD  . docusate  100 mg Per Tube BID  . feeding supplement (PRO-STAT SUGAR FREE 64)  30 mL Per Tube Daily  . Gerhardt's butt cream   Topical TID  . insulin aspart  0-20 Units Subcutaneous Q4H  . mouth rinse  15 mL Mouth Rinse 10 times per day  . multivitamin  1 tablet  Per Tube QHS  . neomycin-bacitracin-polymyxin  1 application Topical Daily  . pantoprazole sodium  40 mg Per Tube QHS  . phenytoin  100 mg Per Tube Q8H  . polyethylene glycol  17 g Per Tube TID  . senna-docusate  1 tablet Per Tube BID  . sodium chloride flush  10-40 mL Intracatheter Q12H   Continuous Infusions: . sodium chloride Stopped (12/30/19 2139)  . sodium chloride    . sodium chloride    .  ceFAZolin (ANCEF) IV    . dextrose Stopped (01/14/20 1609)  . feeding supplement (NEPRO CARB STEADY) 1,000 mL (01/15/20 1252)  . levETIRAcetam 750 mg (01/16/20 2239)          Aline August, MD [facility] 01/17/2020, 7:20 AM

## 2020-01-17 NOTE — Progress Notes (Signed)
[facility] Progress Note    Assessment/ Plan:    Acute kidney injury, oliguric ischemic ATN secondary to hemorrhagic shock requiring pressor support.  Refractory to IV diuretics initiated on CRRT 12/26/2019- 6/20.  First IHD 6/21. IHD  6/23, 6/25, no signs of renal recovery and not much output with Lasix challenge 6/24.  S/p RIJ tunneled dialysis catheter with IR on 6/29.  - Continue with HD per MWF schedule as needed.  Last HD on 7/2.  Note she has had lower inter-dialytic rise but low urine output and tachypnea with vent wean.  Assess dialysis needs daily  - Will follow renal panel daily - Please note that in her current state she is a poor long term candidate for hemodialysis - vent wean per primary team  - Strict ins/outs   L sided hemothorax: s/p VATS with thoracic surgery 6/6.  S/p removal of one chest tube 6/21.     R MCA stroke: s/p thrombectomy 3/08 complicated by worsening cerebral edema with mass effect s/p decompressive right hemicraniectomy and placement of bone flap on 11/02/19.  Had new ICH 6/6.    Acute hypoxic RF: per PCCM.  S/p trach 6/22, wean as tolerated per primary team     Chronic atrial fibrillation: on amiodarone and asa     HTN - UF with HD as tolerated    Seizures: anti-epileptics per neuro    Nutrition: s/p PEG with IR on 6/29   Anemia- s/p multiple pRBCs including one on 6/28 - continue to transfuse as needed.  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   Subjective:    Had HD on 7/2 with 3.5 kg UF.  One unmeasured void charted for 7/2. Per nursing her recorded resp rates do not appear accurate and she has been comfortable when awake  Review of systems: unable to obtain secondary to trach/pt status    Objective:   BP 117/65   Pulse 70   Temp 99.1 F (37.3 C) (Oral)   Resp (!) 31   Ht 5' 4" (1.626 m)   Wt 59 kg    SpO2 98%   BMI 22.33 kg/m   Intake/Output Summary (Last 24 hours) at 01/17/2020 0558 Last data filed at 01/17/2020 0400 Gross per 24 hour  Intake 1030 ml  Output 3750 ml  Net -2720 ml   Weight change: 1.9 kg  Physical Exam: Gen: lying in bed in NAD  HEENT: R hemicraniectomy, trach in place CVS: RRR Resp: clear anteriorly on trach collar Abd: soft non distended Ext: no pitting LE edema; trace to 1+ edema LUE and no RUE edema Neuro - she show her right thumb to me to command; doesn't replicate with left as per her recent baseline ACCESS: R IJ tunneled HD cath  Imaging: DG Chest Port 1 View  Result Date: 01/16/2020 CLINICAL DATA:  Acute respiratory failure. EXAM: PORTABLE CHEST 1 VIEW COMPARISON:  02/11/2020 01/08/2020. FINDINGS: Interim removal of feeding tube. Interim placement of right-sided dual-lumen catheter, its tip is at cavoatrial junction. Tracheostomy tube and left PICC line in stable position. Cardiac pacer stable position. Prior median sternotomy. Cardiomegaly. Progressive bilateral pulmonary infiltrates/edema and bilateral pleural effusions. CHF could present this fashion. Stable left upper lateral pleural thickening noted. Surgical staples left chest. IMPRESSION: 1. Interim removal of feeding tube. Interim placement of right-sided dual-lumen catheter, its tip is at the cavoatrial junction.  Tracheostomy tube and left PICC line stable position. 2. Cardiac pacer and stable position. Prior median sternotomy. Cardiomegaly. 3. Progressive bilateral pulmonary infiltrates/edema bilateral pleural effusions. Findings suggest CHF. 4. Stable left upper lateral pleural thickening. Small loculated effusion cannot be excluded. Electronically Signed   By: Thomas  Register   On: 01/16/2020 06:21    Labs: BMET Recent Labs  Lab 01/11/20 0330 01/12/20 0322 01/13/20 0440 01/14/20 0703 01/15/20 0330 01/16/20 0504  NA 134* 135 136 133* 134* 131*  K 4.4 5.0 4.3 4.9 3.1* 3.7  CL 95* 96* 98 95*  99 94*  CO2 26 24 27 25 28 23  GLUCOSE 141* 155* 55* 117* 141* 181*  BUN 65* 89* 45* 50* 35* 57*  CREATININE 3.71* 4.43* 2.68* 3.51* 2.69* 3.82*  CALCIUM 7.7* 8.0* 8.0* 8.0* 7.9* 8.0*  PHOS 4.8* 5.5*  --  5.1* 3.9 4.9*   CBC Recent Labs  Lab 01/14/20 0357 01/15/20 0330 01/16/20 0504 01/17/20 0505  WBC 12.9* 11.3* 11.5* 12.8*  NEUTROABS  --  8.9*  --  10.1*  HGB 8.9* 8.7* 8.4* 8.9*  HCT 28.6* 28.6* 27.9* 29.3*  MCV 97.6 97.9 96.9 98.0  PLT 232 199 168 175    Medications:    . amantadine  100 mg Per Tube BID  . amiodarone  200 mg Per Tube Daily  . aspirin  325 mg Per Tube Daily  . atorvastatin  40 mg Per Tube Daily  . chlorhexidine gluconate (MEDLINE KIT)  15 mL Mouth Rinse BID  . Chlorhexidine Gluconate Cloth  6 each Topical Q0600  . darbepoetin (ARANESP) injection - DIALYSIS  100 mcg Intravenous Q Fri-HD  . docusate  100 mg Per Tube BID  . feeding supplement (PRO-STAT SUGAR FREE 64)  30 mL Per Tube Daily  . Gerhardt's butt cream   Topical TID  . insulin aspart  0-20 Units Subcutaneous Q4H  . mouth rinse  15 mL Mouth Rinse 10 times per day  . multivitamin  1 tablet Per Tube QHS  . neomycin-bacitracin-polymyxin  1 application Topical Daily  . pantoprazole sodium  40 mg Per Tube QHS  . phenytoin  100 mg Per Tube Q8H  . polyethylene glycol  17 g Per Tube TID  . senna-docusate  1 tablet Per Tube BID  . sodium chloride flush  10-40 mL Intracatheter Q12H     Lori C Foster, MD 01/17/2020, 6:14 AM    

## 2020-01-17 NOTE — Progress Notes (Signed)
[place] Progress Note Patient Name: Krystal Little DOB: January 18, 1956 MRN: [id number]   Date of Service  01/17/2020  HPI/Events of Note  Respiratory distress with desaturation.  eICU Interventions  Patient bagged to bring the saturation back up into the 90's,  Stat portable CXR ordered to exclude pneumothorax vs lung collapse, Pt will be kept on the ventilator  (with a humidified circuit ) overnight.        Kerry Kass Ngozi Alvidrez 01/17/2020, 9:52 PM

## 2020-01-18 ENCOUNTER — Inpatient Hospital Stay (HOSPITAL_COMMUNITY): Payer: Medicare Other

## 2020-01-18 DIAGNOSIS — Z930 Tracheostomy status: Secondary | ICD-10-CM

## 2020-01-18 DIAGNOSIS — J9611 Chronic respiratory failure with hypoxia: Secondary | ICD-10-CM

## 2020-01-18 LAB — MRSA PCR SCREENING: MRSA by PCR: NEGATIVE

## 2020-01-18 LAB — GLUCOSE, CAPILLARY
Glucose-Capillary: 107 mg/dL — ABNORMAL HIGH (ref 70–99)
Glucose-Capillary: 110 mg/dL — ABNORMAL HIGH (ref 70–99)
Glucose-Capillary: 130 mg/dL — ABNORMAL HIGH (ref 70–99)
Glucose-Capillary: 140 mg/dL — ABNORMAL HIGH (ref 70–99)
Glucose-Capillary: 141 mg/dL — ABNORMAL HIGH (ref 70–99)
Glucose-Capillary: 159 mg/dL — ABNORMAL HIGH (ref 70–99)
Glucose-Capillary: 55 mg/dL — ABNORMAL LOW (ref 70–99)
Glucose-Capillary: 59 mg/dL — ABNORMAL LOW (ref 70–99)

## 2020-01-18 LAB — RENAL FUNCTION PANEL
Albumin: 1.2 g/dL — ABNORMAL LOW (ref 3.5–5.0)
Anion gap: 11 (ref 5–15)
BUN: 60 mg/dL — ABNORMAL HIGH (ref 8–23)
CO2: 24 mmol/L (ref 22–32)
Calcium: 7.8 mg/dL — ABNORMAL LOW (ref 8.9–10.3)
Chloride: 96 mmol/L — ABNORMAL LOW (ref 98–111)
Creatinine, Ser: 4.17 mg/dL — ABNORMAL HIGH (ref 0.44–1.00)
GFR calc Af Amer: 12 mL/min — ABNORMAL LOW (ref >60)
GFR calc non Af Amer: 11 mL/min — ABNORMAL LOW (ref >60)
Glucose, Bld: 133 mg/dL — ABNORMAL HIGH (ref 70–99)
Phosphorus: 5.1 mg/dL — ABNORMAL HIGH (ref 2.5–4.6)
Potassium: 4.6 mmol/L (ref 3.5–5.1)
Sodium: 131 mmol/L — ABNORMAL LOW (ref 135–145)

## 2020-01-18 MED ORDER — CHLORHEXIDINE GLUCONATE 0.12 % MT SOLN
OROMUCOSAL | Status: AC
  Filled 2020-01-18: qty 15

## 2020-01-18 MED ORDER — DEXTROSE 50 % IV SOLN
INTRAVENOUS | Status: AC
  Administered 2020-01-18: 12.5 g via INTRAVENOUS
  Filled 2020-01-18: qty 50

## 2020-01-18 MED ORDER — HYDRALAZINE HCL 20 MG/ML IJ SOLN
10.0000 mg | Freq: Four times a day (QID) | INTRAMUSCULAR | Status: DC | PRN
  Administered 2020-01-21 – 2020-01-24 (×3): 10 mg via INTRAVENOUS
  Filled 2020-01-18 (×5): qty 1

## 2020-01-18 MED ORDER — CHLORHEXIDINE GLUCONATE CLOTH 2 % EX PADS
6.0000 | MEDICATED_PAD | Freq: Every day | CUTANEOUS | Status: DC
  Administered 2020-01-19: 6 via TOPICAL

## 2020-01-18 MED ORDER — DEXTROSE 50 % IV SOLN: 12.5000 g | INTRAVENOUS | Status: AC

## 2020-01-18 MED ORDER — LEVETIRACETAM 100 MG/ML PO SOLN
750.0000 mg | ORAL | Status: DC
  Administered 2020-01-18 – 2020-01-31 (×14): 750 mg
  Filled 2020-01-18 (×10): qty 7.5
  Filled 2020-01-18: qty 10
  Filled 2020-01-18: qty 7.5
  Filled 2020-01-18: qty 10
  Filled 2020-01-18 (×5): qty 7.5

## 2020-01-18 NOTE — Progress Notes (Signed)
NAME:  Krystal Little, MRN:  [id number], DOB:  07/28/1955, LOS: 61 ADMISSION DATE:  12/08/2019, CONSULTATION DATE:  5/29 REFERRING MD:  Letta Pate, CHIEF COMPLAINT:  Chest congestion   Brief History   49 female with an extensive cardiac history was admitted to [facility] in April in the setting of a right MCA stroke, had a right interventional radiology guided thrombectomy, later had significant brain edema and underwent craniectomy, ultimately transferred to inpatient rehab.  Pulmonary and critical care medicine was consulted on May 29 in the setting of tachypnea and an enlarging left-sided pleural effusion. Thoracentesis was performed which showed a hemothorax.  A CT chest was then ordered showing obstruction of her left mainstem bronchus and a loculated left pleural effusion.  On May 30 she was moved to the ICU for chest tube placement and bronchoscopy.  Underwent intrapleural TPA but required VATS eventually, course further complicated by ICH , seizures and AKI requiring CRRT  Past Medical History  Status post mitral valve repair 2001 Status post cardiac pacemaker 2001 History of sick sinus syndrome Hypertension Diabetes mellitus type 2 Chronic atrial fibrillation Right MCA stroke 2021  Significant Hospital Events   4/16 IR thrombectomy R MCA stroke 4/25 PCCM sign off 5/6 transfer to [facility] Inpatient Rehab 5/29 PCCM consult left pleural effusion, thoracentesis 5/30 worsening tachypnea. Move to ICU for intubation to facilitate bronchoscopy and chest tube placement 5/31 TPA/ pulmonzyme started for 3 days for persistent left effusion 6/01 Afebrile, low-dose Levophed, Precedex, Minimal output on chest tube- pulmozyme / tpa x 2,  6/02 Weaned x 7 hours yesterday PSV 15/5, pulmozyme and TPA completed 3 days 6/03 afib with RVR placed on amio gtt overnight , 1U PRBC for Hb 7.1  6/05 extubated successfully, Repeat TPA/Pulmonzyme given again  6/06 acute decompensation overnight,  hemorrhagic shock > to OR for left VATS with 2L hemothorax drained.  CT head with new ICH> neurosurgery consulted 6/09 More awake but agitated on PSV weaning  6/12: Seizures + on EEG  6/22 trach 6/27 More awake 6/29 g tube and tunneled vascular catheter placed  Consults:  PCCM  Procedures:  5/29 Thoracentesis >> 1.5 L bloody fluid 5/30 ETT > 6/22, Trach 6/22 >>  5/30 L pigtail chest tube > 6/6 [facility] HD cath 6/12 >6/29 6/29 g tube and tunneled vascular catheter placed   Bronchoscopy 5/30 >>  large, thick, grey mucus plug completely occluding the left mainstem bronchus.   Significant Diagnostic Tests:  Thoracentesis left chest 5/29 >> 8K WBC, 78% WBC, protein 4.0, LDH 182 CT chest abdomen pelvis 6/6 >> large left hemithorax, patchy right lung opacities with small right effusion.  No acute abdominal process CT head 6/6 >> evolution of right MCA infarct with hemorrhagic transformation with large intraparenchymal hematoma, 2 additional small hemorrhages in the inferior cerebellum and left temporal lobe.  Trace subarachnoid blood. CT Head 6/10 >> stable hemorrhagic R MCA.  Right hemi-craniectomy with no midline shift or mass effect.  Mildly regressed small cerebellar hemorrhages.  Stable small volume intraventricular hemorrhage, no ventriculomegaly.  Trace subarachnoid hemorrhage largely resolved.  6/12 EEG: right frontocentral seizures X2   Micro Data:   RVP 4/16 neg  MRSA PCR  4/17 neg   ======== 5/29 Left pleural fluid culture > negative 5/29 left pleural fluid cytology >> No malignant cells identified. Blood and acute inflammation.  5/30 BAL >> rare candida albicans 6/6 L Pleural Fluid >> negative 6/6 BCx2 >> negative   7/3 MRSA   Antimicrobials:  Cefepime 4/24 >> 4/25 Zosyn 4/28 >> 5/4 Zosyn 5/30 >> 6/5 Cefepime 6/6 >> 6/12  Interim history/subjective:  6/30:  S/p g tube and tunneled vascular catheter placement yesterday. Will ask dietary to change tube feeds to nepro per  nephrology recs. Hypoglycemic events yesterday 2/2 npo status prompted initiation of d10 that we can stop as tf back on. Also d/c'd her lantus which may need to be reinitiated but will follow bs first. Relatively significant secretions from trach this am. Will send for cx.  6/29:  Pt awake and following commands. Denies pain. Strong RU and RLE. Flaccid L. BS low this am, npo for peg and tunneled catheter this am.  6/28: pt drowsy on ihd this am. Denies pain. Moves RUE.   Objective   Blood pressure (!) 112/45, pulse 70, temperature 97.6 F (36.4 C), temperature source Oral, resp. rate (!) 36, height _0  (1.626 m), weight 61.7 kg, SpO2 94 %.    Vent Mode: PSV FiO2 (%):  [28 %-40 %] 40 % PEEP:  [5 cmH20] 5 cmH20 Pressure Support:  [15 cmH20] 15 cmH20   Intake/Output Summary (Last 24 hours) at 01/18/2020 0934 Last data filed at 01/18/2020 0800 Gross per 24 hour  Intake 590 ml  Output 150 ml  Net 440 ml   Filed Weights   01/16/20 1130 01/17/20 0500 01/18/20 0500  Weight: 60.4 kg 59 kg 61.7 kg    Examination:  Gen:      NAD, on vent  HEENT:  Occasional trys to track  Neck:      Trach in place, cdi, minimal secretions  Lungs:    BL vented breaths  CV:         RRR s1 s2 Abd:      BS present, nt nd  Ext:    Dependent edema  Neuro:    Moves right ext on command   Labs reviewed: WBC stable, H&H stable  CXR: BL effusion, The patient's images have been independently reviewed by me.    Assessment & Plan:   Acute Respiratory Failure with Hypoxemia- trach 6/22 Mucus Plugging, Suspected Aspiration   Left loculated effusion complicated by hemothorax post VATS decortications MRSA in trach asp, but no sign of infection at this time, patient was MRSA PCR negative in April.  - no fevers, WBC stable  - in any sign of infection low threshold to start therapy  - risks of recurrent abx exposure out weighs benefit at this time  - her problems will include recurrent need for PRN vent support  -  continue TCT as tolerated  - CXR reviewed, still has effusions, fluid removal with HD   Macrocytic anemia:  -b12 :2230 and folate: 10.4 - observe   AKI secondary to shock - no evidence of obstruction on renal US. -s/p tunneled catheter 6/29 - per nephro   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 - s/p right hemicraniotomy, complicated by ICH after intrapleural tPA.   -No neurosurgical intervention  Status Epilepticus, likely related to underlying stroke history :EEG off 6/14 -Keppra and Dilantin per neuro, as needed Ativan for seizure-like activity - dilantin PO  - changed keppra to enteric per tube   DM2 with Hyperglycemia HYPOglycemia event this am.  - CBG with SSI   Chronic Atrial Fibrillation s/p Pacemaker - oral amio - ASA and sq heparin   Moderate Protein Calorie Malnutrition  - TF continued   Goals of Care - Full CODE  - Patient needs placement in  LTACH   Best practice:  Diet: TF Pain/Anxiety/Delirium protocol (if indicated): fent prn VAP protocol (if indicated): yes DVT prophylaxis: Heparin subQ GI prophylaxis: PPI Glucose control: see above Mobility: BR Code Status: full code Family Communication: per primary team  Disposition: AWAITING [place], DO [place] [facility] 01/18/2020 9:34 AM

## 2020-01-18 NOTE — Progress Notes (Signed)
Patient ID: Krystal Little, female   DOB: 1955/10/30, 64 y.o.   MRN: [id number]  PROGRESS NOTE    Krystal Little  WUX:[id number] DOB: Oct 06, 1955 DOA: 12/07/2019 PCP: Eulis Foster, MD   Brief Narrative:  64 year old female with history of sick sinus syndrome status post pacemaker in 2001, mitral valve repair in 2001, hypertension, diabetes mellitus type 2, chronic atrial fibrillation, right MCA stroke in April 2021 for which she underwent IR guided thrombectomy, later had significant brain edema and underwent craniectomy, ultimately transferred to inpatient rehab on 11/20/2019. On 12/13/2019, PCCM was consulted for tachypnea and enlarging left-sided pleural effusion; thoracentesis showed a hemothorax. CT of the chest showed obstruction of her left mainstem bronchus and loculated left pleural effusion. She was admitted to ICU under PCCM service on 11/17/2019 for chest tube placement and bronchoscopy. Underwent intrapleural TPA but required VATS eventually. Course complicated further by ICH, seizures and AKI requiring CRRT. Patient subsequently had tracheostomy placed on 01/11/2020 NG tube and tunneled vascular catheter placed on 01/13/2020. Subsequently she was started on intermittent hemodialysis.  Significant Hospital events 4/16 IR thrombectomy R MCA stroke 5/6 transfer to [facility]patient Re[facility] 5/29 PCCM consult left pleural effusion, thoracentesis 5/30 worsening tachypnea. Moved to ICU for intubation to facilitate bronchoscopy and chest tube placement 5/31 TPA/ pulmonzyme started for 3 days for persistent left effusion 6/01 Afebrile, low-dose Levophed, Precedex, Minimal output on chest tube- pulmozyme / tpa x 2,  6/02 pulmozyme and TPA completed 3 days 6/03 afib with RVR placed on amio gtt overnight , 1U PRBC for Hb 7.1  6/05 extubated successfully, Repeat TPA/Pulmonzyme given again  6/06 acute decompensation overnight, hemorrhagic shock > to OR for left VATS with 2L hemothorax drained.   CT head with new ICH> neurosurgery consulted 6/09 More awake but agitated on PSV weaning  6/12: Seizures + on EEG  6/22 trach 6/29 g tube and tunneled vascular catheter placed 7/2: Care transferred to [place]:   Acute respiratory failure with hypoxia status post tracheostomy Mucous plugging, suspected aspiration Left-sided loculated effusion complicated by hemothorax status post VATS/decortication-Patient has had a long hospitalization and was intubated but subsequently underwent tracheostomy on 01/06/2020. -PCCM following. Continue trach collar. Still requiring vent at night. Wean off as able. -Patient had VATS/decortication with chest tube placement and subsequent removal -Aspiration precautions. Patient already has completed antibiotic treatment -Patient was in respiratory distress with desaturation overnight of 01/17/2020 and is currently requiring ventilator.  Will follow further PCCM recommendations.  AKI, oliguric ischemic ATN secondary to hemorrhagic shock -Refractory to IV diuretics and initiated required CRRT from 12/26/2019-01/04/2020. Subsequently started hemodialysis from 01/05/2020. -Status post right IJ tunneled dialysis catheter placement by IR on 01/13/2020. Nephrology following and managing dialysis. Nephrology thinks that patient is a poor long-term candidate for dialysis in this current state  History of right MCA stroke status post thrombectomy with residual left-sided paralysis status post right hemicraniectomy, complicated by Bee after intrapleural TPA Dysphagia -Patient had development of new ICH on 12/16/2019: Managed medically as per neurosurgery recommendations -G-tube was placed on 01/13/2020 and feeding has been started  Seizures/status epilepticus, likely related to underlying stroke history -EEG had shown status epilepticus -Currently no evidence of seizures. Continue phenytoin via G-tube. Change Keppra to G-tube as well. Continue Ativan as needed.  Outpatient follow-up with neurology  Chronic atrial fibrillation  History of sick sinus syndrome status post pacemaker -Continue amiodarone. Continue aspirin  Moderate protein calorie malnutrition -Continue tube feeding per nutrition  Diabetes mellitus  type 2 with hyperglycemia and hypoglycemia -Continue CBGs with SSI.  Anemia of chronic disease -From chronic illnesses. Hemoglobin stable. Status post multiple PRBCs transfusion. Transfuse if hemoglobin is less than 7  Hypertension -Monitor blood pressure.  Hyponatremia--monitor.  Generalized deconditioning Overall very poor prognosis -Patient is very deconditioned and has very poor prognosis. She should be appropriate for hospice/comfort measures per family wants full scope of treatment. She is currently full code. Palliative care follow-up appreciated; palliative care has signed off. -will need LTACH placement probably   DVT prophylaxis: SCDs Code Status: Full Family Communication: None at bedside  disposition Plan: Status is: Inpatient  Remains inpatient appropriate because:Inpatient level of care appropriate due to severity of illness   Dispo: The patient is from: Home              Anticipated d/c is to: LTAC              Anticipated d/c date is: 1 day              Patient currently is not medically stable to d/c.   Consultants: PCCM/palliative care/nephrology/neurology/neurosurgery/IR  Procedures:  12/13/2019 thoracentesis: 1.5 L bloody fluid Bronchoscopy on 11/21/2019 ETT: 12/09/2019-01/06/2020 Tracheostomy: 01/06/2020 onwards Left pigtail chest tube 11/21/2019-12/19/2019 [facility] HD cath from 12/27/2019-01/13/2020 G-tube and tunneled vascular catheter placement on 01/13/2020  Overnight EEG on 12/27/2019: Right frontocentral seizures x2  Antimicrobials: Cefepime 4/24 >> 4/25 Zosyn 4/28 >> 5/4 Zosyn 5/30 >> 6/5 Cefepime 6/6 >> 6/12   Subjective: Patient seen and examined at bedside.  Extremely poor historian.  Awake,  does not answer any questions.  Patient was more tachypneic and was desaturating overnight as per nursing staff.   Objective: Vitals:   01/17/20 2200 01/17/20 2300 01/18/20 0000 01/18/20 0439  BP: (!) 153/57  (!) 151/54 (!) 164/59  Pulse: 75   70  Resp: (!) 37   (!) 28  Temp:  99.2 F (37.3 C)    TempSrc:  Oral    SpO2: 99%   100%  Weight:      Height:        Intake/Output Summary (Last 24 hours) at 01/18/2020 0754 Last data filed at 01/18/2020 0000 Gross per 24 hour  Intake 275 ml  Output 0 ml  Net 275 ml   Filed Weights   01/16/20 0737 01/16/20 1130 01/17/20 0500  Weight: 64.5 kg 60.4 kg 59 kg    Examination:  General exam: No acute distress.  Awake, does not answer any questions. Poor historian. Looks very deconditioned and chronically ill ENT: Trach present Respiratory system: Bilateral decreased breath sounds at bases with scattered crackles.  Tachypneic Cardiovascular system: S1-S2 heard, rate controlled Gastrointestinal system: Abdomen is nondistended, soft and nontender. G-tube present.  Normal bowel sounds heard Extremities: Bilateral lower extremity trace edema present.  No clubbing  Central nervous system: Extremely poor historian.  Does not respond to questions.  Awake  skin: No obvious ecchymosis/lesions psychiatry: Could not be assessed because of mental status    Data Reviewed: I have personally reviewed following labs and imaging studies  CBC: Recent Labs  Lab 01/13/20 0440 01/14/20 0357 01/15/20 0330 01/16/20 0504 01/17/20 0505  WBC 11.6* 12.9* 11.3* 11.5* 12.8*  NEUTROABS  --   --  8.9*  --  10.1*  HGB 8.5* 8.9* 8.7* 8.4* 8.9*  HCT 27.9* 28.6* 28.6* 27.9* 29.3*  MCV 97.2 97.6 97.9 96.9 98.0  PLT 225 232 199 168 115   Basic Metabolic Panel: Recent Labs  Lab 01/12/20  5146 01/13/20 0440 01/14/20 0703 01/15/20 0330 01/16/20 0504 01/17/20 0505 01/18/20 0500  NA 135   < > 133* 134* 131* 132* 131*  K 5.0   < > 4.9 3.1* 3.7 3.8 4.6  CL 96*    < > 95* 99 94* 97* 96*  CO2 24   < > '25 28 23 26 24  ' GLUCOSE 155*   < > 117* 141* 181* 179* 133*  BUN 89*   < > 50* 35* 57* 37* 60*  CREATININE 4.43*   < > 3.51* 2.69* 3.82* 2.79* 4.17*  CALCIUM 8.0*   < > 8.0* 7.9* 8.0* 8.0* 7.8*  MG 2.8*  --   --   --  2.5*  --   --   PHOS 5.5*  --  5.1* 3.9 4.9* 3.2 5.1*   < > = values in this interval not displayed.   GFR: Estimated Creatinine Clearance: 11.6 mL/min (A) (by C-G formula based on SCr of 4.17 mg/dL (H)). Liver Function Tests: Recent Labs  Lab 01/14/20 0703 01/15/20 0330 01/16/20 0504 01/17/20 0505 01/18/20 0500  ALBUMIN 1.4* 1.3* 1.4* 1.3* 1.2*   No results for input(s): LIPASE, AMYLASE in the last 168 hours. No results for input(s): AMMONIA in the last 168 hours. Coagulation Profile: No results for input(s): INR, PROTIME in the last 168 hours. Cardiac Enzymes: No results for input(s): CKTOTAL, CKMB, CKMBINDEX, TROPONINI in the last 168 hours. BNP (last 3 results) No results for input(s): PROBNP in the last 8760 hours. HbA1C: No results for input(s): HGBA1C in the last 72 hours. CBG: Recent Labs  Lab 01/17/20 2037 01/17/20 2332 01/18/20 0325 01/18/20 0328 01/18/20 0402  GLUCAP 127* 132* 55* 59* 110*   Lipid Profile: No results for input(s): CHOL, HDL, LDLCALC, TRIG, CHOLHDL, LDLDIRECT in the last 72 hours. Thyroid Function Tests: No results for input(s): TSH, T4TOTAL, FREET4, T3FREE, THYROIDAB in the last 72 hours. Anemia Panel: No results for input(s): VITAMINB12, FOLATE, FERRITIN, TIBC, IRON, RETICCTPCT in the last 72 hours. Sepsis Labs: No results for input(s): PROCALCITON, LATICACIDVEN in the last 168 hours.  Recent Results (from the past 240 hour(s))  Culture, respiratory     Status: None   Collection Time: 01/14/20 12:46 PM   Specimen: Tracheal Aspirate; Respiratory  Result Value Ref Range Status   Specimen Description TRACHEAL ASPIRATE  Final   Special Requests NONE  Final   Gram Stain   Final    NO  WBC SEEN NO ORGANISMS SEEN Performed at [facility], [address] [place], [place] [address]    Culture FEW METHICILLIN RESISTANT STAPHYLOCOCCUS AUREUS  Final   Report Status 01/17/2020 FINAL  Final   Organism ID, Bacteria METHICILLIN RESISTANT STAPHYLOCOCCUS AUREUS  Final      Susceptibility   Methicillin resistant staphylococcus aureus - MIC*    CIPROFLOXACIN >=8 RESISTANT Resistant     ERYTHROMYCIN >=8 RESISTANT Resistant     GENTAMICIN <=0.5 SENSITIVE Sensitive     OXACILLIN >=4 RESISTANT Resistant     TETRACYCLINE <=1 SENSITIVE Sensitive     VANCOMYCIN 1 SENSITIVE Sensitive     TRIMETH/SULFA <=10 SENSITIVE Sensitive     CLINDAMYCIN <=0.25 SENSITIVE Sensitive     RIFAMPIN <=0.5 SENSITIVE Sensitive     Inducible Clindamycin NEGATIVE Sensitive     * FEW METHICILLIN RESISTANT STAPHYLOCOCCUS AUREUS         Radiology Studies: No results found.      Scheduled Meds: . amantadine  100 mg Per Tube BID  .  amiodarone  200 mg Per Tube Daily  . aspirin  325 mg Per Tube Daily  . atorvastatin  40 mg Per Tube Daily  . chlorhexidine gluconate (MEDLINE KIT)  15 mL Mouth Rinse BID  . Chlorhexidine Gluconate Cloth  6 each Topical Q0600  . darbepoetin (ARANESP) injection - DIALYSIS  100 mcg Intravenous Q Fri-HD  . docusate  100 mg Per Tube BID  . feeding supplement (PRO-STAT SUGAR FREE 64)  30 mL Per Tube Daily  . Gerhardt's butt cream   Topical TID  . insulin aspart  0-20 Units Subcutaneous Q4H  . mouth rinse  15 mL Mouth Rinse 10 times per day  . multivitamin  1 tablet Per Tube QHS  . neomycin-bacitracin-polymyxin  1 application Topical Daily  . pantoprazole sodium  40 mg Per Tube QHS  . phenytoin  100 mg Per Tube Q8H  . polyethylene glycol  17 g Per Tube TID  . senna-docusate  1 tablet Per Tube BID  . sodium chloride flush  10-40 mL Intracatheter Q12H   Continuous Infusions: . sodium chloride Stopped (12/30/19 2139)  . sodium chloride    . sodium chloride    .   ceFAZolin (ANCEF) IV    . dextrose Stopped (01/14/20 1609)  . feeding supplement (NEPRO CARB STEADY) 45 mL/hr at 01/17/20 1700  . levETIRAcetam 750 mg (01/17/20 2237)          Aline [place], MD [facility] 01/18/2020, 7:54 AM

## 2020-01-18 NOTE — Progress Notes (Signed)
[facility] Progress Note    Assessment/ Plan:    Acute kidney injury, oliguric ischemic ATN secondary to hemorrhagic shock requiring pressor support.  Refractory to IV diuretics initiated on CRRT 12/26/2019- 6/20.  First IHD 6/21. IHD  6/23, 6/25, no signs of renal recovery and not much output with Lasix challenge 6/24.  S/p RIJ tunneled dialysis catheter with IR on 6/29.  - Continue with HD per MWF schedule as needed.  Last HD on 7/2.  Note she has had lower inter-dialytic rise but low urine output and tachypnea with vent wean.  Assess dialysis needs daily  - Will follow renal panel daily - Please note that in her current state she is a poor long term candidate for hemodialysis - vent wean per primary team  - Strict ins/outs   L sided hemothorax: s/p VATS with thoracic surgery 6/6.  S/p removal of one chest tube 6/21.     R MCA stroke: s/p thrombectomy 0/86 complicated by worsening cerebral edema with mass effect s/p decompressive right hemicraniectomy and placement of bone flap on 11/02/19.  Had new ICH 6/6.    Acute hypoxic RF: per PCCM.  S/p trach 6/22, wean as tolerated per primary team     Chronic atrial fibrillation: on amiodarone and asa     HTN - UF with HD as tolerated    Seizures: anti-epileptics per neuro    Nutrition: s/p PEG with IR on 6/29   Anemia- s/p multiple pRBCs including one on 6/28 - continue to transfuse as needed.  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   Subjective:    Had HD on 7/2 with 3.5 kg UF.  One unmeasured void charted for 7/2. Per nursing her recorded resp rates do not appear accurate and she has been comfortable when awake.  Nursing noted that her right abd had a hard large nodular area and is going to check in with oncoming nurse and later with team if this is a new finding to discuss plans and further imaging.    Review of systems: unable to obtain secondary to trach/pt status    Objective:   BP (!) 164/59   Pulse 70   Temp 99.2 F (37.3 C) (Oral)   Resp (!) 28   Ht '5\' 4"'  (1.626 m)   Wt 59 kg   SpO2 100%   BMI 22.33 kg/m   Intake/Output Summary (Last 24 hours) at 01/18/2020 5784 Last data filed at 01/18/2020 0000 Gross per 24 hour  Intake 275 ml  Output 0 ml  Net 275 ml   Weight change:   Physical Exam: Gen: lying in bed in NAD  HEENT: R hemicraniectomy, trach in place CVS: RRR Resp: clear anteriorly on vent Abd: soft on left but large hard nodular region below a past scar on the right abdomen; does not appear tender and non distended Ext: no pitting LE edema; trace to 1+ edema LUE and no RUE edema Neuro - she show her right thumb to me to command; doesn't replicate with left as per her recent baseline ACCESS: R IJ tunneled HD cath  Imaging: No results found.  Labs: BMET Recent Labs  Lab 01/12/20 0322 01/13/20 0440 01/14/20 0703 01/15/20 0330 01/16/20 0504 01/17/20 0505  NA 135 136 133* 134* 131* 132*  K 5.0 4.3 4.9 3.1* 3.7 3.8  CL 96* 98 95* 99 94*  97*  CO2 '24 27 25 28 23 26  ' GLUCOSE 155* 55* 117* 141* 181* 179*  BUN 89* 45* 50* 35* 57* 37*  CREATININE 4.43* 2.68* 3.51* 2.69* 3.82* 2.79*  CALCIUM 8.0* 8.0* 8.0* 7.9* 8.0* 8.0*  PHOS 5.5*  --  5.1* 3.9 4.9* 3.2   CBC Recent Labs  Lab 01/14/20 0357 01/15/20 0330 01/16/20 0504 01/17/20 0505  WBC 12.9* 11.3* 11.5* 12.8*  NEUTROABS  --  8.9*  --  10.1*  HGB 8.9* 8.7* 8.4* 8.9*  HCT 28.6* 28.6* 27.9* 29.3*  MCV 97.6 97.9 96.9 98.0  PLT 232 199 168 175    Medications:    . amantadine  100 mg Per Tube BID  . amiodarone  200 mg Per Tube Daily  . aspirin  325 mg Per Tube Daily  . atorvastatin  40 mg Per Tube Daily  . chlorhexidine gluconate (MEDLINE KIT)  15 mL Mouth Rinse BID  . Chlorhexidine Gluconate Cloth  6 each Topical Q0600  . darbepoetin (ARANESP) injection - DIALYSIS  100 mcg Intravenous Q Fri-HD   . docusate  100 mg Per Tube BID  . feeding supplement (PRO-STAT SUGAR FREE 64)  30 mL Per Tube Daily  . Gerhardt's butt cream   Topical TID  . insulin aspart  0-20 Units Subcutaneous Q4H  . mouth rinse  15 mL Mouth Rinse 10 times per day  . multivitamin  1 tablet Per Tube QHS  . neomycin-bacitracin-polymyxin  1 application Topical Daily  . pantoprazole sodium  40 mg Per Tube QHS  . phenytoin  100 mg Per Tube Q8H  . polyethylene glycol  17 g Per Tube TID  . senna-docusate  1 tablet Per Tube BID  . sodium chloride flush  10-40 mL Intracatheter Q12H     Claudia Desanctis, MD 01/18/2020, 6:11 AM

## 2020-01-19 LAB — RENAL FUNCTION PANEL
Albumin: 1.2 g/dL — ABNORMAL LOW (ref 3.5–5.0)
Anion gap: 12 (ref 5–15)
BUN: 81 mg/dL — ABNORMAL HIGH (ref 8–23)
CO2: 25 mmol/L (ref 22–32)
Calcium: 7.9 mg/dL — ABNORMAL LOW (ref 8.9–10.3)
Chloride: 95 mmol/L — ABNORMAL LOW (ref 98–111)
Creatinine, Ser: 4.92 mg/dL — ABNORMAL HIGH (ref 0.44–1.00)
GFR calc Af Amer: 10 mL/min — ABNORMAL LOW (ref >60)
GFR calc non Af Amer: 9 mL/min — ABNORMAL LOW (ref >60)
Glucose, Bld: 135 mg/dL — ABNORMAL HIGH (ref 70–99)
Phosphorus: 6.1 mg/dL — ABNORMAL HIGH (ref 2.5–4.6)
Potassium: 4.4 mmol/L (ref 3.5–5.1)
Sodium: 132 mmol/L — ABNORMAL LOW (ref 135–145)

## 2020-01-19 LAB — CBC
HCT: 26.3 % — ABNORMAL LOW (ref 36.0–46.0)
Hemoglobin: 8.1 g/dL — ABNORMAL LOW (ref 12.0–15.0)
MCH: 29.9 pg (ref 26.0–34.0)
MCHC: 30.8 g/dL (ref 30.0–36.0)
MCV: 97 fL (ref 80.0–100.0)
Platelets: 131 10*3/uL — ABNORMAL LOW (ref 150–400)
RBC: 2.71 MIL/uL — ABNORMAL LOW (ref 3.87–5.11)
RDW: 19.2 % — ABNORMAL HIGH (ref 11.5–15.5)
WBC: 12.4 10*3/uL — ABNORMAL HIGH (ref 4.0–10.5)
nRBC: 0 % (ref 0.0–0.2)

## 2020-01-19 LAB — GLUCOSE, CAPILLARY
Glucose-Capillary: 133 mg/dL — ABNORMAL HIGH (ref 70–99)
Glucose-Capillary: 121 mg/dL — ABNORMAL HIGH (ref 70–99)
Glucose-Capillary: 135 mg/dL — ABNORMAL HIGH (ref 70–99)
Glucose-Capillary: 92 mg/dL (ref 70–99)
Glucose-Capillary: 126 mg/dL — ABNORMAL HIGH (ref 70–99)
Glucose-Capillary: 107 mg/dL — ABNORMAL HIGH (ref 70–99)
Glucose-Capillary: 145 mg/dL — ABNORMAL HIGH (ref 70–99)

## 2020-01-19 MED ORDER — HEPARIN SODIUM (PORCINE) 1000 UNIT/ML IJ SOLN
INTRAMUSCULAR | Status: AC
  Administered 2020-01-19: 3200 [IU] via INTRAVENOUS_CENTRAL
  Filled 2020-01-19: qty 4

## 2020-01-19 NOTE — Progress Notes (Addendum)
NAME:  Krystal Little, MRN:  [id number], DOB:  [birth year], LOS: 47 ADMISSION DATE:  11/24/2019, CONSULTATION DATE:  5/29 REFERRING MD:  Letta Pate, CHIEF COMPLAINT:  Chest congestion   Brief History   30 female with an extensive cardiac history was admitted to [facility] in April in the setting of a right MCA stroke, had a right interventional radiology guided thrombectomy, later had significant brain edema and underwent craniectomy, ultimately transferred to inpatient rehab.  Pulmonary and critical care medicine was consulted on May 29 in the setting of tachypnea and an enlarging left-sided pleural effusion. Thoracentesis was performed which showed a hemothorax.  A CT chest was then ordered showing obstruction of her left mainstem bronchus and a loculated left pleural effusion.  On May 30 she was moved to the ICU for chest tube placement and bronchoscopy.  Underwent intrapleural TPA but required VATS eventually, course further complicated by ICH , seizures and AKI requiring CRRT  Past Medical History  Status post mitral valve repair 2001 Status post cardiac pacemaker 2001 History of sick sinus syndrome Hypertension Diabetes mellitus type 2 Chronic atrial fibrillation Right MCA stroke 2021  Significant Hospital Events   4/16 IR thrombectomy R MCA stroke 4/25 PCCM sign off 5/6 transfer to [facility] 5/29 PCCM consult left pleural effusion, thoracentesis 5/30 worsening tachypnea. Move to ICU for intubation to facilitate bronchoscopy and chest tube placement 5/31 TPA/ pulmonzyme started for 3 days for persistent left effusion 6/01 Afebrile, low-dose Levophed, Precedex, Minimal output on chest tube- pulmozyme / tpa x 2,  6/02 Weaned x 7 hours yesterday PSV 15/5, pulmozyme and TPA completed 3 days 6/03 afib with RVR placed on amio gtt overnight , 1U PRBC for Hb 7.1  6/05 extubated successfully, Repeat TPA/Pulmonzyme given again  6/06 acute decompensation overnight,  hemorrhagic shock > to OR for left VATS with 2L hemothorax drained.  CT head with new ICH> neurosurgery consulted 6/09 More awake but agitated on PSV weaning  6/12: Seizures + on EEG  6/22 trach 6/27 More awake 6/29 g tube and tunneled vascular catheter placed  Consults:  PCCM  Procedures:  5/29 Thoracentesis >> 1.5 L bloody fluid 5/30 ETT > 6/22, Trach 6/22 >>  5/30 L pigtail chest tube > 6/6 [facility] HD cath 6/12 >6/29 6/29 g tube and tunneled vascular catheter placed   Bronchoscopy 5/30 >>  large, thick, grey mucus plug completely occluding the left mainstem bronchus.   Significant Diagnostic Tests:  Thoracentesis left chest 5/29 >> 8K WBC, 78% WBC, protein 4.0, LDH 182 CT chest abdomen pelvis 6/6 >> large left hemithorax, patchy right lung opacities with small right effusion.  No acute abdominal process CT head 6/6 >> evolution of right MCA infarct with hemorrhagic transformation with large intraparenchymal hematoma, 2 additional small hemorrhages in the inferior cerebellum and left temporal lobe.  Trace subarachnoid blood. CT Head 6/10 >> stable hemorrhagic R MCA.  Right hemi-craniectomy with no midline shift or mass effect.  Mildly regressed small cerebellar hemorrhages.  Stable small volume intraventricular hemorrhage, no ventriculomegaly.  Trace subarachnoid hemorrhage largely resolved.  6/12 EEG: right frontocentral seizures X2   Micro Data:   RVP 4/16 neg  MRSA PCR  4/17 neg   ======== 5/29 Left pleural fluid culture > negative 5/29 left pleural fluid cytology >> No malignant cells identified. Blood and acute inflammation.  5/30 BAL >> rare candida albicans 6/6 L Pleural Fluid >> negative 6/6 BCx2 >> negative   7/3 MRSA   Antimicrobials:  Cefepime 4/24 >> 4/25 Zosyn 4/28 >> 5/4 Zosyn 5/30 >> 6/5 Cefepime 6/6 >> 6/12  Interim history/subjective:  6/30:  S/p g tube and tunneled vascular catheter placement yesterday. Will ask dietary to change tube feeds to nepro per  nephrology recs. Hypoglycemic events yesterday 2/2 npo status prompted initiation of d10 that we can stop as tf back on. Also d/c'd her lantus which may need to be reinitiated but will follow bs first. Relatively significant secretions from trach this am. Will send for cx.  6/29:  Pt awake and following commands. Denies pain. Strong RU and RLE. Flaccid L. BS low this am, npo for peg and tunneled catheter this am.  6/28: pt drowsy on ihd this am. Denies pain. Moves RUE.   Objective   Blood pressure (!) 115/54, pulse 61, temperature 98.6 F (37 C), resp. rate (!) 25, height _0  (1.626 m), weight 63 kg, SpO2 100 %.    Vent Mode: PSV;CPAP FiO2 (%):  [40 %] 40 % PEEP:  [5 cmH20] 5 cmH20 Pressure Support:  [15 cmH20] 15 cmH20 Plateau Pressure:  [17 cmH20-20 cmH20] 20 cmH20   Intake/Output Summary (Last 24 hours) at 01/19/2020 0928 Last data filed at 01/19/2020 0800 Gross per 24 hour  Intake 1090 ml  Output 595 ml  Net 495 ml   Filed Weights   01/18/20 0500 01/19/20 0500 01/19/20 0650  Weight: 61.7 kg 62.7 kg 63 kg    Examination:   General: NAD, chronically ill appearing,  HEENT: Trach in place on vent, facial hair  Cardiovascular: Normal rate, regular rhythm.  No murmurs, rubs, or gallops Pulmonary : Bilateral vent breaths  Abdominal: soft, nontender,  bowel sounds present Musculoskeletal: no swelling , 2+ distal pulses,  Skin: Warm, dry  Psychiatric/Behavioral:  not able to access Neuro: Opened eyes on command, moves right arm    Labs reviewed: WBC stable, Hgb stable    Assessment & Plan:   Acute Respiratory Failure with Hypoxemia- trach 6/22 Mucus Plugging, Suspected Aspiration   Left loculated effusion complicated by hemothorax post VATS decortications MRSA in trach asp, but no sign of infection at this time, patient was MRSA PCR negative in April.  - no fevers, WBC stable - will need recurrent PRN vent support - continue TCT as tolerated  Macrocytic anemia:  -b12  :2230 and folate: 10.4 - observe   AKI secondary to shock - no evidence of obstruction on renal US. -s/p tunneled catheter 6/29 - per nephro   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 - s/p right hemicraniotomy, complicated by ICH after intrapleural tPA.   -No neurosurgical intervention  Status Epilepticus, likely related to underlying stroke history :EEG off 6/14 -Keppra and Dilantin per neuro, as needed Ativan for seizure-like activity - dilantin PO  - keppra per enteric tube   DM2 with Hyperglycemia HYPOglycemia event this am.  - CBG with SSI   Chronic Atrial Fibrillation s/p Pacemaker - oral amio - ASA and sq heparin   Moderate Protein Calorie Malnutrition  - TF continued   Goals of Care - Full CODE  - Patient needs placement in LTACH   Best practice:  Diet: TF Pain/Anxiety/Delirium protocol (if indicated): fent prn VAP protocol (if indicated): yes DVT prophylaxis: Heparin subQ GI prophylaxis: PPI Glucose control: see above Mobility: BR Code Status: full code Family Communication: per primary team  Disposition: AWAITING LTACH   Tamsen Snider, MD PGY66   64 year old female, prolonged hospital course following right MCA stroke ultimately having  hemothorax, hemorrhagic conversion of previous stroke, decompressive craniotomy.  Developed multiorgan failure need for CVVHD and prolonged mechanical ventilation.  Patient ultimately requiring prolonged mechanical vent support with tracheostomy.  Pulmonary now following for tracheostomy care.  BP (!) 146/57 (BP Location: Right Leg)   Pulse 70   Temp 99 F (37.2 C) (Oral)   Resp (!) 30   Ht _0  (1.626 m)   Wt 63 kg   SpO2 100%   BMI 23.84 kg/m   General: Chronically ill debilitated on mechanical vent HEENT: Trach in place, CDI, no significant secretions or bleeding. Heart regular rhythm S1-S2 Lungs: Bilateral mechanically ventilated breath sounds  Vent Mode: CPAP;PSV FiO2 (%):  [40 %] 40 % PEEP:  [5  cmH20] 5 cmH20 Pressure Support:  [15 cmH20] 15 cmH20 Plateau Pressure:  [17 cmH20-20 cmH20] 20 cmH20  Assessment: Chronic hypoxic respiratory failure requiring tracheostomy tube placement and mechanical ventilation. -Ventilator settings reviewed, stable and CPAP pressure support, 40% FiO2, PEEP of 5, pressure support 15.  Able to maintain normal minute ventilation. ESRD on HD.  Plan: She will need LTAC placement Continue routine trach care Attempt TCT as tolerated Vent settings reviewed as above.  PCCM will continue to follow for tracheostomy tube management and vent management as needed.  Garner Nash, DO [facility] 01/19/2020 11:41 AM

## 2020-01-19 NOTE — Progress Notes (Signed)
Patient ID: Krystal Little, female   DOB: [birth year], 64 y.o.   MRN: [id number]  PROGRESS NOTE    [name]  ZPH:[id number] DOB: [birth year] DOA: 11/19/2019 PCP: Eulis Foster, MD   Brief Narrative:  64 year old female with history of sick sinus syndrome status post pacemaker in 2001, mitral valve repair in 2001, hypertension, diabetes mellitus type 2, chronic atrial fibrillation, right MCA stroke in April 2021 for which she underwent IR guided thrombectomy, later had significant brain edema and underwent craniectomy, ultimately transferred to inpatient rehab on 11/20/2019. On 12/13/2019, PCCM was consulted for tachypnea and enlarging left-sided pleural effusion; thoracentesis showed a hemothorax. CT of the chest showed obstruction of her left mainstem bronchus and loculated left pleural effusion. She was admitted to ICU under PCCM service on 11/23/2019 for chest tube placement and bronchoscopy. Underwent intrapleural TPA but required VATS eventually. Course complicated further by ICH, seizures and AKI requiring CRRT. Patient subsequently had tracheostomy placed on 01/11/2020 NG tube and tunneled vascular catheter placed on 01/13/2020. Subsequently she was started on intermittent hemodialysis.  Significant Hospital events 4/16 IR thrombectomy R MCA stroke 5/6 transfer to [facility] 5/29 PCCM consult left pleural effusion, thoracentesis 5/30 worsening tachypnea. Moved to ICU for intubation to facilitate bronchoscopy and chest tube placement 5/31 TPA/ pulmonzyme started for 3 days for persistent left effusion 6/01 Afebrile, low-dose Levophed, Precedex, Minimal output on chest tube- pulmozyme / tpa x 2,  6/02 pulmozyme and TPA completed 3 days 6/03 afib with RVR placed on amio gtt overnight , 1U PRBC for Hb 7.1  6/05 extubated successfully, Repeat TPA/Pulmonzyme given again  6/06 acute decompensation overnight, hemorrhagic shock > to OR for left VATS with 2L hemothorax drained.   CT head with new ICH> neurosurgery consulted 6/09 More awake but agitated on PSV weaning  6/12: Seizures + on EEG  6/22 trach 6/29 g tube and tunneled vascular catheter placed 7/2: Care transferred to [place]:   Acute respiratory failure with hypoxia status post tracheostomy Mucous plugging, suspected aspiration Left-sided loculated effusion complicated by hemothorax status post VATS/decortication-Patient has had a long hospitalization and was intubated but subsequently underwent tracheostomy on 01/06/2020. -PCCM following. Continue trach collar. Still requiring vent at night. Wean off as able. -Patient had VATS/decortication with chest tube placement and subsequent removal -Aspiration precautions. Patient already has completed antibiotic treatment -Patient was in respiratory distress with desaturation overnight of 01/17/2020 and is currently requiring ventilator during daytime as well.  Will follow further PCCM recommendations.  AKI, oliguric ischemic ATN secondary to hemorrhagic shock -Refractory to IV diuretics and initiated required CRRT from 12/26/2019-01/04/2020. Subsequently started hemodialysis from 01/05/2020. -Status post right IJ tunneled dialysis catheter placement by IR on 01/13/2020. Nephrology following and managing dialysis. Nephrology thinks that patient is a poor long-term candidate for dialysis in this current state  History of right MCA stroke status post thrombectomy with residual left-sided paralysis status post right hemicraniectomy, complicated by [place] after intrapleural TPA Dysphagia -Patient had development of new ICH on 12/20/2019: Managed medically as per neurosurgery recommendations -G-tube was placed on 01/13/2020 and feeding has been started  Seizures/status epilepticus, likely related to underlying stroke history -EEG had shown status epilepticus -Currently no evidence of seizures. Continue phenytoin via G-tube. Change Keppra to G-tube as well.  Continue Ativan as needed. Outpatient follow-up with neurology  Chronic atrial fibrillation  History of sick sinus syndrome status post pacemaker -Continue amiodarone. Continue aspirin  Moderate protein calorie malnutrition -Continue tube feeding per  nutrition  Diabetes mellitus type 2 with hyperglycemia and hypoglycemia -Continue CBGs with SSI.  Anemia of chronic disease -From chronic illnesses. Hemoglobin stable. Status post multiple PRBCs transfusion. Transfuse if hemoglobin is less than 7  Hypertension -Monitor blood pressure.  Hyponatremia--monitor.  Generalized deconditioning Overall very poor prognosis -Patient is very deconditioned and has very poor prognosis. She should be appropriate for hospice/comfort measures per family wants full scope of treatment. She is currently full code. Palliative care follow-up appreciated; palliative care has signed off. -will need LTACH placement probably   DVT prophylaxis: SCDs Code Status: Full Family Communication: None at bedside  disposition Plan: Status is: Inpatient  Remains inpatient appropriate because:Inpatient level of care appropriate due to severity of illness   Dispo: The patient is from: Home              Anticipated d/c is to: LTAC              Anticipated d/c date is: 1 day              Patient currently is not medically stable to d/c.   Consultants: PCCM/palliative care/nephrology/neurology/neurosurgery/IR  Procedures:  12/13/2019 thoracentesis: 1.5 L bloody fluid Bronchoscopy on 12/02/2019 ETT: 11/30/2019-01/06/2020 Tracheostomy: 01/06/2020 onwards Left pigtail chest tube 11/24/2019-01/02/2020 [facility] HD cath from 12/27/2019-01/13/2020 G-tube and tunneled vascular catheter placement on 01/13/2020  Overnight EEG on 12/27/2019: Right frontocentral seizures x2  Antimicrobials: Cefepime 4/24 >> 4/25 Zosyn 4/28 >> 5/4 Zosyn 5/30 >> 6/5 Cefepime 6/6 >> 6/12   Subjective: Patient seen and examined at bedside.  Extremely  poor historian.  Hardly answers any questions.  No overnight temperature spikes, vomiting reported by nursing staff  objective: Vitals:   01/19/20 0600 01/19/20 0630 01/19/20 0700 01/19/20 0732  BP: (!) 122/54 132/64 (!) 151/66 (!) 144/61  Pulse: (!) 59 60 (!) 59 61  Resp: (!) 27 (!) 25 (!) 29   Temp:      TempSrc:      SpO2: 100% 100% 100% 100%  Weight:      Height:        Intake/Output Summary (Last 24 hours) at 01/19/2020 0745 Last data filed at 01/19/2020 0700 Gross per 24 hour  Intake 1135 ml  Output 745 ml  Net 390 ml   Filed Weights   01/17/20 0500 01/18/20 0500 01/19/20 0500  Weight: 59 kg 61.7 kg 62.7 kg    Examination:  General exam: No distress.  Awake, does not answer any questions. Poor historian. Looks very deconditioned and chronically ill ENT: Tracheostomy present and on the ventilator Respiratory system: Bilateral decreased breath sounds at bases with crackles.  Tachypneic  cardiovascular system: Intermittent bradycardia.  S1 S2 heard  gastrointestinal system: Abdomen is nondistended, soft and nontender. G-tube present.  Bowel sounds are heard Extremities: Trace lower extremity bilateral edema.  No cyanosis  Central nervous system: Extremely poor historian.  Awake; does not respond to questions.  skin: No obvious rashes/lesions psychiatry: Cannot assess because of mental status    Data Reviewed: I have personally reviewed following labs and imaging studies  CBC: Recent Labs  Lab 01/13/20 0440 01/14/20 0357 01/15/20 0330 01/16/20 0504 01/17/20 0505  WBC 11.6* 12.9* 11.3* 11.5* 12.8*  NEUTROABS  --   --  8.9*  --  10.1*  HGB 8.5* 8.9* 8.7* 8.4* 8.9*  HCT 27.9* 28.6* 28.6* 27.9* 29.3*  MCV 97.2 97.6 97.9 96.9 98.0  PLT 225 232 199 168 106   Basic Metabolic Panel: Recent Labs  Lab 01/15/20  0330 01/16/20 0504 01/17/20 0505 01/18/20 0500 01/19/20 0319  NA 134* 131* 132* 131* 132*  K 3.1* 3.7 3.8 4.6 4.4  CL 99 94* 97* 96* 95*  CO2 _0 GLUCOSE 141* 181* 179* 133* 135*  BUN 35* 57* 37* 60* 81*  CREATININE 2.69* 3.82* 2.79* 4.17* 4.92*  CALCIUM 7.9* 8.0* 8.0* 7.8* 7.9*  MG  --  2.5*  --   --   --   PHOS 3.9 4.9* 3.2 5.1* 6.1*   GFR: Estimated Creatinine Clearance: 9.8 mL/min (A) (by C-G formula based on SCr of 4.92 mg/dL (H)). Liver Function Tests: Recent Labs  Lab 01/15/20 0330 01/16/20 0504 01/17/20 0505 01/18/20 0500 01/19/20 0319  ALBUMIN 1.3* 1.4* 1.3* 1.2* 1.2*   No results for input(s): LIPASE, AMYLASE in the last 168 hours. No results for input(s): AMMONIA in the last 168 hours. Coagulation Profile: No results for input(s): INR, PROTIME in the last 168 hours. Cardiac Enzymes: No results for input(s): CKTOTAL, CKMB, CKMBINDEX, TROPONINI in the last 168 hours. BNP (last 3 results) No results for input(s): PROBNP in the last 8760 hours. HbA1C: No results for input(s): HGBA1C in the last 72 hours. CBG: Recent Labs  Lab 01/18/20 1126 01/18/20 1521 01/18/20 1927 01/18/20 2335 01/19/20 0341  GLUCAP 159* 107* 140* 141* 121*   Lipid Profile: No results for input(s): CHOL, HDL, LDLCALC, TRIG, CHOLHDL, LDLDIRECT in the last 72 hours. Thyroid Function Tests: No results for input(s): TSH, T4TOTAL, FREET4, T3FREE, THYROIDAB in the last 72 hours. Anemia Panel: No results for input(s): VITAMINB12, FOLATE, FERRITIN, TIBC, IRON, RETICCTPCT in the last 72 hours. Sepsis Labs: No results for input(s): PROCALCITON, LATICACIDVEN in the last 168 hours.  Recent Results (from the past 240 hour(s))  Culture, respiratory     Status: None   Collection Time: 01/14/20 12:46 PM   Specimen: Tracheal Aspirate; Respiratory  Result Value Ref Range Status   Specimen Description TRACHEAL ASPIRATE  Final   Special Requests NONE  Final   Gram Stain   Final    NO WBC SEEN NO ORGANISMS SEEN Performed at [facility], [address], [place], [place] [address]    Culture FEW METHICILLIN RESISTANT STAPHYLOCOCCUS  AUREUS  Final   Report Status 01/17/2020 FINAL  Final   Organism ID, Bacteria METHICILLIN RESISTANT STAPHYLOCOCCUS AUREUS  Final      Susceptibility   Methicillin resistant staphylococcus aureus - MIC*    CIPROFLOXACIN >=8 RESISTANT Resistant     ERYTHROMYCIN >=8 RESISTANT Resistant     GENTAMICIN <=0.5 SENSITIVE Sensitive     OXACILLIN >=4 RESISTANT Resistant     TETRACYCLINE <=1 SENSITIVE Sensitive     VANCOMYCIN 1 SENSITIVE Sensitive     TRIMETH/SULFA <=10 SENSITIVE Sensitive     CLINDAMYCIN <=0.25 SENSITIVE Sensitive     RIFAMPIN <=0.5 SENSITIVE Sensitive     Inducible Clindamycin NEGATIVE Sensitive     * FEW METHICILLIN RESISTANT STAPHYLOCOCCUS AUREUS  MRSA PCR Screening     Status: None   Collection Time: 01/18/20  8:56 AM   Specimen: Nasal Mucosa; Nasopharyngeal  Result Value Ref Range Status   MRSA by PCR NEGATIVE NEGATIVE Final    Comment:        The GeneXpert MRSA Assay (FDA approved for NASAL specimens only), is one component of a comprehensive MRSA colonization surveillance program. It is not intended to diagnose MRSA infection nor to guide or monitor treatment for MRSA infections. Performed at [facility]  Lab, [address], [place], [place] [address]          Radiology Studies: DG Chest Port 1 View  Result Date: 01/18/2020 CLINICAL DATA:  64 year old female with respiratory failure EXAM: PORTABLE CHEST 1 VIEW COMPARISON:  01/16/2020, 01/12/2020 FINDINGS: Cardiomediastinal silhouette unchanged in size and contour Surgical changes of median sternotomy. Unchanged position of left chest wall cardiac pacing device with incompletely imaged leads. Unchanged tracheostomy. Unchanged left upper extremity PICC. Similar appearance of mixed interstitial and airspace disease of the bilateral lungs with opacities at the bilateral lung bases obscuring the hemidiaphragm and the left heart border. No pneumothorax. Unchanged right tunneled IJ HD catheter. IMPRESSION: Similar  appearance of mixed interstitial and airspace disease, likely a combination of edema, chronic lung changes/atelectasis, and/or consolidation. Bilateral pleural effusions persist. Unchanged right IJ hemodialysis catheter, left upper extremity PICC, tracheostomy, right chest wall cardiac pacing leads. Electronically Signed   By: Corrie Mckusick D.O.   On: 01/18/2020 09:04        Scheduled Meds: . amantadine  100 mg Per Tube BID  . amiodarone  200 mg Per Tube Daily  . aspirin  325 mg Per Tube Daily  . atorvastatin  40 mg Per Tube Daily  . chlorhexidine gluconate (MEDLINE KIT)  15 mL Mouth Rinse BID  . Chlorhexidine Gluconate Cloth  6 each Topical Q0600  . Chlorhexidine Gluconate Cloth  6 each Topical Q0600  . darbepoetin (ARANESP) injection - DIALYSIS  100 mcg Intravenous Q Fri-HD  . docusate  100 mg Per Tube BID  . feeding supplement (PRO-STAT SUGAR FREE 64)  30 mL Per Tube Daily  . Gerhardt's butt cream   Topical TID  . heparin sodium (porcine)      . insulin aspart  0-20 Units Subcutaneous Q4H  . levETIRAcetam  750 mg Per Tube Q24H  . mouth rinse  15 mL Mouth Rinse 10 times per day  . multivitamin  1 tablet Per Tube QHS  . neomycin-bacitracin-polymyxin  1 application Topical Daily  . pantoprazole sodium  40 mg Per Tube QHS  . phenytoin  100 mg Per Tube Q8H  . polyethylene glycol  17 g Per Tube TID  . senna-docusate  1 tablet Per Tube BID  . sodium chloride flush  10-40 mL Intracatheter Q12H   Continuous Infusions: . sodium chloride Stopped (12/30/19 2139)  . sodium chloride    . sodium chloride    . feeding supplement (NEPRO CARB STEADY) 45 mL/hr at 01/19/20 0700          Aline August, MD [facility] 01/19/2020, 7:45 AM

## 2020-01-19 NOTE — Progress Notes (Signed)
Patient ID: CLEVELAND PAIZ, female   DOB: Dec 27, 1955, 64 y.o.   MRN: [id number] S: tolerated HD well this morning O:BP (!) 138/59   Pulse 60   Temp 99 F (37.2 C) (Oral)   Resp (!) 24   Ht _0  (1.626 m)   Wt 63 kg   SpO2 99%   BMI 23.84 kg/m   Intake/Output Summary (Last 24 hours) at 01/19/2020 1309 Last data filed at 01/19/2020 1300 Gross per 24 hour  Intake 1035 ml  Output 595 ml  Net 440 ml   Intake/Output: I/O last 3 completed shifts: In: 1685 [I.V.:10; Other:100; NG/GT:1575] Out: 745 [Stool:745]  Intake/Output this shift:  Total I/O In: 270 [NG/GT:270] Out: -  Weight change: 1 kg Gen: chronically ill-appearing AAF in NAd, confused CVS: no rub Resp: scattered rhonchi Abd: +BS, soft, Nt/ND Ext: no edema  Recent Labs  Lab 01/13/20 0440 01/14/20 0703 01/15/20 0330 01/16/20 0504 01/17/20 0505 01/18/20 0500 01/19/20 0319  NA 136 133* 134* 131* 132* 131* 132*  K 4.3 4.9 3.1* 3.7 3.8 4.6 4.4  CL 98 95* 99 94* 97* 96* 95*  CO2 _1 GLUCOSE 55* 117* 141* 181* 179* 133* 135*  BUN 45* 50* 35* 57* 37* 60* 81*  CREATININE 2.68* 3.51* 2.69* 3.82* 2.79* 4.17* 4.92*  ALBUMIN  --  1.4* 1.3* 1.4* 1.3* 1.2* 1.2*  CALCIUM 8.0* 8.0* 7.9* 8.0* 8.0* 7.8* 7.9*  PHOS  --  5.1* 3.9 4.9* 3.2 5.1* 6.1*   Liver Function Tests: Recent Labs  Lab 01/17/20 0505 01/18/20 0500 01/19/20 0319  ALBUMIN 1.3* 1.2* 1.2*   No results for input(s): LIPASE, AMYLASE in the last 168 hours. No results for input(s): AMMONIA in the last 168 hours. CBC: Recent Labs  Lab 01/14/20 0357 01/14/20 0357 01/15/20 0330 01/15/20 0330 01/16/20 0504 01/17/20 0505 01/19/20 0700  WBC 12.9*   < > 11.3*   < > 11.5* 12.8* 12.4*  NEUTROABS  --   --  8.9*  --   --  10.1*  --   HGB 8.9*   < > 8.7*   < > 8.4* 8.9* 8.1*  HCT 28.6*   < > 28.6*   < > 27.9* 29.3* 26.3*  MCV 97.6  --  97.9  --  96.9 98.0 97.0  PLT 232   < > 199   < > 168 175 131*   < > = values in this interval not displayed.    Cardiac Enzymes: No results for input(s): CKTOTAL, CKMB, CKMBINDEX, TROPONINI in the last 168 hours. CBG: Recent Labs  Lab 01/18/20 1927 01/18/20 2335 01/19/20 0341 01/19/20 0759 01/19/20 1128  GLUCAP 140* 141* 121* 135* 92    Iron Studies: No results for input(s): IRON, TIBC, TRANSFERRIN, FERRITIN in the last 72 hours. Studies/Results: DG Chest Port 1 View  Result Date: 01/18/2020 CLINICAL DATA:  64 year old female with respiratory failure EXAM: PORTABLE CHEST 1 VIEW COMPARISON:  01/16/2020, 01/12/2020 FINDINGS: Cardiomediastinal silhouette unchanged in size and contour Surgical changes of median sternotomy. Unchanged position of left chest wall cardiac pacing device with incompletely imaged leads. Unchanged tracheostomy. Unchanged left upper extremity PICC. Similar appearance of mixed interstitial and airspace disease of the bilateral lungs with opacities at the bilateral lung bases obscuring the hemidiaphragm and the left heart border. No pneumothorax. Unchanged right tunneled IJ HD catheter. IMPRESSION: Similar appearance of mixed interstitial and airspace disease, likely a combination of edema, chronic lung changes/atelectasis, and/or consolidation. Bilateral pleural  effusions persist. Unchanged right IJ hemodialysis catheter, left upper extremity PICC, tracheostomy, right chest wall cardiac pacing leads. Electronically Signed   By: Corrie Mckusick D.O.   On: 01/18/2020 09:04   . amantadine  100 mg Per Tube BID  . amiodarone  200 mg Per Tube Daily  . aspirin  325 mg Per Tube Daily  . atorvastatin  40 mg Per Tube Daily  . chlorhexidine gluconate (MEDLINE KIT)  15 mL Mouth Rinse BID  . Chlorhexidine Gluconate Cloth  6 each Topical Q0600  . Chlorhexidine Gluconate Cloth  6 each Topical Q0600  . darbepoetin (ARANESP) injection - DIALYSIS  100 mcg Intravenous Q Fri-HD  . docusate  100 mg Per Tube BID  . feeding supplement (PRO-STAT SUGAR FREE 64)  30 mL Per Tube Daily  . Gerhardt's butt  cream   Topical TID  . insulin aspart  0-20 Units Subcutaneous Q4H  . levETIRAcetam  750 mg Per Tube Q24H  . mouth rinse  15 mL Mouth Rinse 10 times per day  . multivitamin  1 tablet Per Tube QHS  . neomycin-bacitracin-polymyxin  1 application Topical Daily  . pantoprazole sodium  40 mg Per Tube QHS  . phenytoin  100 mg Per Tube Q8H  . polyethylene glycol  17 g Per Tube TID  . senna-docusate  1 tablet Per Tube BID  . sodium chloride flush  10-40 mL Intracatheter Q12H    BMET    Component Value Date/Time   NA 132 (L) 01/19/2020 0319   NA 140 08/18/2019 1120   K 4.4 01/19/2020 0319   CL 95 (L) 01/19/2020 0319   CO2 25 01/19/2020 0319   GLUCOSE 135 (H) 01/19/2020 0319   BUN 81 (H) 01/19/2020 0319   BUN 12 08/18/2019 1120   CREATININE 4.92 (H) 01/19/2020 0319   CREATININE 0.73 05/18/2016 0919   CALCIUM 7.9 (L) 01/19/2020 0319   GFRNONAA 9 (L) 01/19/2020 0319   GFRNONAA 89 05/18/2016 0919   GFRAA 10 (L) 01/19/2020 0319   GFRAA >89 05/18/2016 0919   CBC    Component Value Date/Time   WBC 12.4 (H) 01/19/2020 0700   RBC 2.71 (L) 01/19/2020 0700   HGB 8.1 (L) 01/19/2020 0700   HGB 15.2 08/18/2019 1120   HCT 26.3 (L) 01/19/2020 0700   HCT 45.9 08/18/2019 1120   PLT 131 (L) 01/19/2020 0700   PLT 168 08/18/2019 1120   MCV 97.0 01/19/2020 0700   MCV 94 08/18/2019 1120   MCH 29.9 01/19/2020 0700   MCHC 30.8 01/19/2020 0700   RDW 19.2 (H) 01/19/2020 0700   RDW 12.2 08/18/2019 1120   LYMPHSABS 1.3 01/17/2020 0505   MONOABS 1.1 (H) 01/17/2020 0505   EOSABS 0.1 01/17/2020 0505   BASOSABS 0.1 01/17/2020 0505     Assessment/Plan:  1. AKI- due to ischemic ATN in setting of hemorrhagic shock requiring pressor support and initiated on CRRT 6/11-6/20/21 and transitioned to IHD on 6/21 and has remained oliguric and dialysis dependent.  S/p HD again today.Will continue with Hd on MWF schedule for now. 2. Left sided hemothorax- s/p VATS on 6/6 and s/p removal of one chest tube  6/21 3. 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 4. 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 Chronic atrial fibrillation- on amio and asa 6. HTN- stable 7. Seizure disorder- per neuro 8. Nutrition- s/p PEG by IR on 6/29 9.  ABLA/Anemia of critical illness- s/p multiple transfusions 10. Disposition- will need LTAC but poor overall prognosis.  Continue with ongoing discussions with family regarding poor prognosis for meaningful recovery.  Donetta Potts, MD [employer] [phone]

## 2020-01-20 LAB — COMPREHENSIVE METABOLIC PANEL
ALT: 70 U/L — ABNORMAL HIGH (ref 0–44)
AST: 189 U/L — ABNORMAL HIGH (ref 15–41)
Albumin: 1.3 g/dL — ABNORMAL LOW (ref 3.5–5.0)
Alkaline Phosphatase: 860 U/L — ABNORMAL HIGH (ref 38–126)
Anion gap: 11 (ref 5–15)
BUN: 48 mg/dL — ABNORMAL HIGH (ref 8–23)
CO2: 26 mmol/L (ref 22–32)
Calcium: 7.9 mg/dL — ABNORMAL LOW (ref 8.9–10.3)
Chloride: 93 mmol/L — ABNORMAL LOW (ref 98–111)
Creatinine, Ser: 3.26 mg/dL — ABNORMAL HIGH (ref 0.44–1.00)
GFR calc Af Amer: 16 mL/min — ABNORMAL LOW (ref >60)
GFR calc non Af Amer: 14 mL/min — ABNORMAL LOW (ref >60)
Glucose, Bld: 134 mg/dL — ABNORMAL HIGH (ref 70–99)
Potassium: 3.4 mmol/L — ABNORMAL LOW (ref 3.5–5.1)
Sodium: 130 mmol/L — ABNORMAL LOW (ref 135–145)
Total Bilirubin: 0.6 mg/dL (ref 0.3–1.2)
Total Protein: 8.4 g/dL — ABNORMAL HIGH (ref 6.5–8.1)

## 2020-01-20 LAB — CBC WITH DIFFERENTIAL/PLATELET
Abs Immature Granulocytes: 0.06 10*3/uL (ref 0.00–0.07)
Basophils Absolute: 0.1 10*3/uL (ref 0.0–0.1)
Basophils Relative: 1 %
Eosinophils Absolute: 0.1 10*3/uL (ref 0.0–0.5)
Eosinophils Relative: 1 %
HCT: 26.8 % — ABNORMAL LOW (ref 36.0–46.0)
Hemoglobin: 8.1 g/dL — ABNORMAL LOW (ref 12.0–15.0)
Immature Granulocytes: 1 %
Lymphocytes Relative: 10 %
Lymphs Abs: 1.1 10*3/uL (ref 0.7–4.0)
MCH: 29.3 pg (ref 26.0–34.0)
MCHC: 30.2 g/dL (ref 30.0–36.0)
MCV: 97.1 fL (ref 80.0–100.0)
Monocytes Absolute: 1.2 10*3/uL — ABNORMAL HIGH (ref 0.1–1.0)
Monocytes Relative: 12 %
Neutro Abs: 8 10*3/uL — ABNORMAL HIGH (ref 1.7–7.7)
Neutrophils Relative %: 75 %
Platelets: 143 10*3/uL — ABNORMAL LOW (ref 150–400)
RBC: 2.76 MIL/uL — ABNORMAL LOW (ref 3.87–5.11)
RDW: 19.3 % — ABNORMAL HIGH (ref 11.5–15.5)
WBC: 10.6 10*3/uL — ABNORMAL HIGH (ref 4.0–10.5)
nRBC: 0 % (ref 0.0–0.2)

## 2020-01-20 LAB — GLUCOSE, CAPILLARY
Glucose-Capillary: 111 mg/dL — ABNORMAL HIGH (ref 70–99)
Glucose-Capillary: 128 mg/dL — ABNORMAL HIGH (ref 70–99)
Glucose-Capillary: 129 mg/dL — ABNORMAL HIGH (ref 70–99)
Glucose-Capillary: 134 mg/dL — ABNORMAL HIGH (ref 70–99)
Glucose-Capillary: 139 mg/dL — ABNORMAL HIGH (ref 70–99)
Glucose-Capillary: 85 mg/dL (ref 70–99)

## 2020-01-20 LAB — MAGNESIUM: Magnesium: 2.3 mg/dL (ref 1.7–2.4)

## 2020-01-20 LAB — PHENYTOIN LEVEL, TOTAL: Phenytoin Lvl: 4.2 ug/mL — ABNORMAL LOW (ref 10.0–20.0)

## 2020-01-20 LAB — PHOSPHORUS: Phosphorus: 3.5 mg/dL (ref 2.5–4.6)

## 2020-01-20 NOTE — TOC Progression Note (Addendum)
Transition of Care [facility]) - Progression Note    Patient Details  Name: HILIARY OSORTO MRN: [id number] Date of Birth: 08/02/1955  [facility] CM/SW Contact  Curlene Labrum, RN Phone Number: 01/20/2020, 4:31 PM  Clinical Narrative:    Case management contacted [facility] to inquire about bed availability at their facility.  Burgess Estelle, RNCM with [facility] will call back in the morning to determine bed availability if available.  I also called [facility] as well and Barrett Shell, [facility] will revisit the patient's clinicals and ask [facility] about bed availability. The last palliative care note stated that the patient's husband declined patient's admission to [facility]. Will continue to follow for placement for the patient.   Expected Discharge Plan: Long Term Acute Care (LTAC) Barriers to Discharge: Continued Medical Work up (Patient received trach yesterday and is starting trach trials today.  No PEG at this time.)  Expected Discharge Plan and Services Expected Discharge Plan: Long Term Acute Care (LTAC)   Discharge Planning Services: CM Consult Post Acute Care Choice: Long Term Acute Care (LTAC) ([facility]ers [facility]. [facility] and 2. [facility]) Living arrangements for the past 2 months: Single Family Home                                       Social Determinants of Health (SDOH) Interventions    Readmission Risk Interventions Readmission Risk Prevention Plan 01/07/2020  Transportation Screening Complete  Medication Review Press photographer) Complete  PCP or Specialist appointment within 3-5 days of discharge Complete  HRI or Home Care Consult Complete  SW Recovery Care/Counseling Consult Complete  Palliative Care Screening Complete  Skilled Nursing Facility Complete  Some recent data might be hidden

## 2020-01-20 NOTE — Progress Notes (Signed)
Occupational Therapy Treatment Patient Details Name: Krystal Little MRN: [id number] DOB: 03-14-1956 Today's Date: 01/20/2020    History of present illness Pt is 64 y.o. female initially admitted for stroke. 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 Pt s/p R hemicraniectomy with bone flap placed in abdomen on 4/18.  PMH HTN, DM2, chronic a. Fib (coumadin) s/p pace maker.  She went to inpatient rehab and on May 29 readmitted to hospital in the setting of tachypnea and an enlarging left-sided pleural effusion. Thoracentesis was performed which showed a hemothorax.  A CT chest was then ordered showing obstruction of her left mainstem bronchus and a loculated left pleural effusion. She had bronchoscopy and L chest tube placed 5/30.  She was intubated 5/30-12/19/19. Pt reintubated 6/6 and underwent lt thoracotomy to drain hemothorax. Post op found to have craniotomy area fuller and CT showed  hemorrhage into the posterior R MCA distribution. Pt developed AKI and CRRT 6/11- 6/20. Intermittent HD initiated. Trach placed 6/22.    OT comments  Pt seen in conjunction with PT today to work on progression towards EOB. Pt continues to require totalA(+2) for mobility tasks, tolerating sitting EOB approx 10 min during session. Pt engaging with therapists via head nods - nodding head appropriately when given multiple choice questions related to orientation (place, year). Pt following majority of simple commands given increased time and multimodal cues. Fatigued with seated activity but overall tolerating well and with VSS on trach collar/vent. Will continue per POC at this time.   Follow Up Recommendations  LTACH    Equipment Recommendations  Other (comment) (TBD)          Precautions / Restrictions Precautions Precautions: Fall Precaution Comments: R hemicraniectomy- bone flap in R abdomen, needs helmet for OOB,  Restrictions Weight Bearing Restrictions: No       Mobility Bed  Mobility Overal bed mobility: Needs Assistance Bed Mobility: Supine to Sit;Sit to Supine     Supine to sit: +2 for physical assistance;Total assist;HOB elevated Sit to supine: +2 for physical assistance;Total assist   General bed mobility comments: Pt assisting to move her RLE.  Transfers                 General transfer comment: deferred     Balance Overall balance assessment: Needs assistance Sitting-balance support: Bilateral upper extremity supported;Feet unsupported Sitting balance-Leahy Scale: Zero Sitting balance - Comments: Pt max assist to maintaing sitting EOB. Sat EOB x 15 minutes. Worked on reaching with RUE, LE ex's, upright posture with shoulders back and head up. Neck ROM. Worked on lateral leans to forearms Postural control: Posterior lean;Left lateral lean                                 ADL either performed or assessed with clinical judgement   ADL Overall ADL's : Needs assistance/impaired                                     Functional mobility during ADLs: Total assistance;+2 for physical assistance;+2 for safety/equipment (bed mobility) General ADL Comments: pt tolerating sitting EOB, engaging in seated reaching, LE exercise, cervical/scapular stretching                       Cognition Arousal/Alertness: Awake/alert (with stimuli) Behavior During Therapy: Flat  affect Overall Cognitive Status: Difficult to assess                         Following Commands: Follows one step commands consistently;Follows one step commands with increased time (with rt side)       General Comments: By giving pt choices she is oriented to place and time.         Exercises Exercises: General Lower Extremity General Exercises - Lower Extremity Long Arc Quad: AAROM;5 reps;Seated Other Exercises Other Exercises: seated reaching with RUE Other Exercises: cervical stretching/ROM, scapular retraction/stretching in  sitting Other Exercises: WBing through bil elbow   Shoulder Instructions       General Comments      Pertinent Vitals/ Pain       Pain Assessment: Faces Faces Pain Scale: No hurt Pain Intervention(s): Monitored during session  Home Living                                          Prior Functioning/Environment              Frequency  Min 2X/week        Progress Toward Goals  OT Goals(current goals can now be found in the care plan section)  Progress towards OT goals: Progressing toward goals  Acute Rehab OT Goals Patient Stated Goal: Pt trached OT Goal Formulation: With patient/family Time For Goal Achievement: 01/28/20 Potential to Achieve Goals: Good  Plan Discharge plan remains appropriate    Co-evaluation    PT/OT/SLP Co-Evaluation/Treatment: Yes Reason for Co-Treatment: Complexity of the patient's impairments (multi-system involvement);For patient/therapist safety;To address functional/ADL transfers PT goals addressed during session: Mobility/safety with mobility;Balance OT goals addressed during session: ADL's and self-care;Strengthening/ROM      AM-PAC OT "6 Clicks" Daily Activity     Outcome Measure   Help from another person eating meals?: Total Help from another person taking care of personal grooming?: A Lot Help from another person toileting, which includes using toliet, bedpan, or urinal?: Total Help from another person bathing (including washing, rinsing, drying)?: Total Help from another person to put on and taking off regular upper body clothing?: Total Help from another person to put on and taking off regular lower body clothing?: Total 6 Click Score: 7    End of Session Equipment Utilized During Treatment: Oxygen  OT Visit Diagnosis: Muscle weakness (generalized) (M62.81);Other abnormalities of gait and mobility (R26.89);Other symptoms and signs involving cognitive function;Hemiplegia and hemiparesis Hemiplegia -  Right/Left: Left Hemiplegia - dominant/non-dominant: Non-Dominant Hemiplegia - caused by: Cerebral infarction   Activity Tolerance Patient tolerated treatment well   Patient Left in bed;with call bell/phone within reach   Nurse Communication Mobility status        Time: 5732-2025 OT Time Calculation (min): 33 min  Charges: OT General Charges $OT Visit: 1 Visit OT Treatments $Therapeutic Activity: 8-22 mins  Lou Cal, OT Acute Rehabilitation Services Pager [phone] Office [phone]    Raymondo Band 01/20/2020, 1:09 PM

## 2020-01-20 NOTE — Progress Notes (Signed)
Patient ID: Krystal Little, female   DOB: 02/11/1956, 64 y.o.   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 On 12/13/2019, PCCM was consulted for tachypnea and enlarging left-sided pleural effusion; thoracentesis showed a hemothorax. CT of the chest showed obstruction of her left mainstem bronchus and loculated left pleural effusion. She was admitted to ICU under PCCM service on 12/04/2019 for chest tube placement and bronchoscopy. Underwent intrapleural TPA but required VATS eventually. Course complicated further by ICH, seizures and AKI requiring CRRT. Patient subsequently had tracheostomy placed on 01/11/2020 NG tube and tunneled vascular catheter placed on 01/13/2020. Subsequently she was started on intermittent hemodialysis.  Significant Hospital events 4/16 IR thrombectomy R MCA stroke 5/6 transfer to [facility] 5/29 PCCM consult left pleural effusion, thoracentesis 5/30 worsening tachypnea. Moved to ICU for intubation to facilitate bronchoscopy and chest tube placement 5/31 TPA/ pulmonzyme started for 3 days for persistent left effusion 6/01 Afebrile, low-dose Levophed, Precedex, Minimal output on chest tube- pulmozyme / tpa x 2,  6/02 pulmozyme and TPA completed 3 days 6/03 afib with RVR placed on amio gtt overnight , 1U PRBC for Hb 7.1  6/05 extubated successfully, Repeat TPA/Pulmonzyme given again  6/06 acute decompensation overnight, hemorrhagic shock > to OR for left VATS with 2L hemothorax drained.   CT head with new ICH> neurosurgery consulted 6/09 More awake but agitated on PSV weaning  6/12: Seizures + on EEG  6/22 trach 6/29 g tube and tunneled vascular catheter placed 7/2: Care transferred to [place]:   Acute respiratory failure with hypoxia status post tracheostomy Mucous plugging, suspected aspiration Left-sided loculated effusion complicated by hemothorax status post VATS/decortication-Patient has had a long hospitalization and was intubated but subsequently underwent tracheostomy on 01/06/2020. -PCCM following. Continue trach collar. Still requiring vent at night. Wean off as able. -Patient had VATS/decortication with chest tube placement and subsequent removal -Aspiration precautions. Patient already has completed antibiotic treatment -Patient was in respiratory distress with desaturation overnight of 01/17/2020 and is currently requiring ventilator during daytime as well.  Will follow further PCCM recommendations. -Temperature max of 100 over the last 24 hours.  If the patient persistently spikes temperatures, will probably restart antibiotics for possible aspiration pneumonia.  AKI, oliguric ischemic ATN secondary to hemorrhagic shock -Refractory to IV diuretics and initiated required CRRT from 12/26/2019-01/04/2020. Subsequently started hemodialysis from 01/05/2020. -Status post right IJ tunneled dialysis catheter placement by IR on 01/13/2020. Nephrology following and managing dialysis. Nephrology thinks that patient is a poor long-term candidate for dialysis in this current state  History of right MCA stroke status post thrombectomy with residual left-sided paralysis status post right hemicraniectomy, complicated by [place] after intrapleural TPA Dysphagia -Patient had development of new ICH on 12/25/2019: Managed medically as per neurosurgery recommendations -G-tube was placed on 01/13/2020 and feeding has been started  Seizures/status epilepticus, likely related to  underlying stroke history -EEG had shown status epilepticus -Currently no evidence of seizures. Continue phenytoin via G-tube. Change Keppra to G-tube as well. Continue Ativan as needed. Outpatient follow-up with neurology  Chronic  atrial fibrillation  History of sick sinus syndrome status post pacemaker -Continue amiodarone. Continue aspirin  Moderate protein calorie malnutrition -Continue tube feeding per nutrition  Diabetes mellitus type 2 with hyperglycemia and hypoglycemia -Continue CBGs with SSI.  Anemia of chronic disease -From chronic illnesses. Hemoglobin stable. Status post multiple PRBCs transfusion. Transfuse if hemoglobin is less than 7  Hypertension -Monitor blood pressure.  Hyponatremia--monitor.  Generalized deconditioning Overall very poor prognosis -Patient is very deconditioned and has very poor prognosis. She should be appropriate for hospice/comfort measures per family wants full scope of treatment. She is currently full code. Palliative care follow-up appreciated; palliative care has signed off. -will need LTACH placement probably   DVT prophylaxis: SCDs Code Status: Full Family Communication: [name]  disposition Plan: Status is: Inpatient  Remains inpatient appropriate because:Inpatient level of care appropriate due to severity of illness   Dispo: The patient is from: Home              Anticipated d/c is to: LTAC              Anticipated d/c date is: 1 day              Patient currently is not medically stable to d/c.   Consultants: PCCM/palliative care/nephrology/neurology/neurosurgery/IR  Procedures:  12/13/2019 thoracentesis: 1.5 L bloody fluid Bronchoscopy on 12/13/2019 ETT: 11/28/2019-01/06/2020 Tracheostomy: 01/06/2020 onwards Left pigtail chest tube 11/16/2019-12/30/2019 [facility] HD cath from 12/27/2019-01/13/2020 G-tube and tunneled vascular catheter placement on 01/13/2020  Overnight EEG on 12/27/2019: Right frontocentral seizures  x2  Antimicrobials: Cefepime 4/24 >> 4/25 Zosyn 4/28 >> 5/4 Zosyn 5/30 >> 6/5 Cefepime 6/6 >> 6/12   Subjective: Patient seen and examined at bedside.  Extremely poor historian.  Awake, hardly answers any questions.  No overnight vomiting or seizures reported by nursing staff. objective: Vitals:   01/20/20 0530 01/20/20 0600 01/20/20 0630 01/20/20 0700  BP: (!) 141/57 (!) 128/54 (!) 134/55 (!) 137/59  Pulse: 65 62 64 63  Resp: (!) 28 (!) 28 (!) 29 (!) 28  Temp:      TempSrc:      SpO2: 97% 99% 99% 99%  Weight:      Height:        Intake/Output Summary (Last 24 hours) at 01/20/2020 0721 Last data filed at 01/20/2020 0700 Gross per 24 hour  Intake 1080 ml  Output 3500 ml  Net -2420 ml   Filed Weights   01/19/20 0650 01/19/20 1102 01/20/20 0500  Weight: 63 kg 59.6 kg 62.3 kg    Examination:  General exam: No acute distress.  Awake, does not answer any questions. Poor historian. Looks very deconditioned and chronically ill ENT: Lurline Idol present and currently on a ventilator  respiratory system: Bilateral decreased breath sounds at bases with scattered crackles.  Intermittently tachypneic  cardiovascular system: S1-S2 heard, rate controlled gastrointestinal system: Abdomen is nondistended, soft and nontender. G-tube present.  Normal bowel sounds heard Extremities: Bilateral lower extremity edema present.  No clubbing Central nervous system: Very poor historian.  Awake; does not respond to questions.  skin: No obvious ecchymosis/ulcers psychiatry: Could not be assessed because of mental status   Data Reviewed: I have personally reviewed following labs and imaging studies  CBC: Recent Labs  Lab 01/15/20 0330 01/16/20 0504 01/17/20 0505 01/19/20 0700 01/20/20 0554  WBC 11.3* 11.5* 12.8* 12.4* 10.6*  NEUTROABS 8.9*  --  10.1*  --  8.0*  HGB 8.7* 8.4* 8.9* 8.1* 8.1*  HCT 28.6* 27.9* 29.3* 26.3* 26.8*  MCV 97.9 96.9 98.0 97.0 97.1  PLT 199 168 175 131* 143*   Basic  Metabolic Panel: Recent Labs  Lab 01/16/20 0504 01/17/20 0505 01/18/20 0500 01/19/20 0319 01/20/20 0554  NA 131* 132* 131* 132* 130*  K 3.7 3.8 4.6 4.4 3.4*  CL 94* 97* 96* 95* 93*  CO2 '23 26 24 25 26  ' GLUCOSE 181* 179* 133* 135* 134*  BUN 57* 37* 60* 81* 48*  CREATININE 3.82* 2.79* 4.17* 4.92* 3.26*  CALCIUM 8.0* 8.0* 7.8* 7.9* 7.9*  MG 2.5*  --   --   --  2.3  PHOS 4.9* 3.2 5.1* 6.1* 3.5   GFR: Estimated Creatinine Clearance: 14.9 mL/min (A) (by C-G formula based on SCr of 3.26 mg/dL (H)). Liver Function Tests: Recent Labs  Lab 01/16/20 0504 01/17/20 0505 01/18/20 0500 01/19/20 0319 01/20/20 0554  AST  --   --   --   --  189*  ALT  --   --   --   --  70*  ALKPHOS  --   --   --   --  860*  BILITOT  --   --   --   --  0.6  PROT  --   --   --   --  8.4*  ALBUMIN 1.4* 1.3* 1.2* 1.2* 1.3*   No results for input(s): LIPASE, AMYLASE in the last 168 hours. No results for input(s): AMMONIA in the last 168 hours. Coagulation Profile: No results for input(s): INR, PROTIME in the last 168 hours. Cardiac Enzymes: No results for input(s): CKTOTAL, CKMB, CKMBINDEX, TROPONINI in the last 168 hours. BNP (last 3 results) No results for input(s): PROBNP in the last 8760 hours. HbA1C: No results for input(s): HGBA1C in the last 72 hours. CBG: Recent Labs  Lab 01/19/20 1128 01/19/20 1604 01/19/20 1939 01/19/20 2327 01/20/20 0337  GLUCAP 92 126* 107* 145* 111*   Lipid Profile: No results for input(s): CHOL, HDL, LDLCALC, TRIG, CHOLHDL, LDLDIRECT in the last 72 hours. Thyroid Function Tests: No results for input(s): TSH, T4TOTAL, FREET4, T3FREE, THYROIDAB in the last 72 hours. Anemia Panel: No results for input(s): VITAMINB12, FOLATE, FERRITIN, TIBC, IRON, RETICCTPCT in the last 72 hours. Sepsis Labs: No results for input(s): PROCALCITON, LATICACIDVEN in the last 168 hours.  Recent Results (from the past 240 hour(s))  Culture, respiratory     Status: None   Collection  Time: 01/14/20 12:46 PM   Specimen: Tracheal Aspirate; Respiratory  Result Value Ref Range Status   Specimen Description TRACHEAL ASPIRATE  Final   Special Requests NONE  Final   Gram Stain   Final    NO WBC SEEN NO ORGANISMS SEEN Performed at [facility], [address] [place], [place] [address]    Culture FEW METHICILLIN RESISTANT STAPHYLOCOCCUS AUREUS  Final   Report Status 01/17/2020 FINAL  Final   Organism ID, Bacteria METHICILLIN RESISTANT STAPHYLOCOCCUS AUREUS  Final      Susceptibility   Methicillin resistant staphylococcus aureus - MIC*    CIPROFLOXACIN >=8 RESISTANT Resistant     ERYTHROMYCIN >=8 RESISTANT Resistant     GENTAMICIN <=0.5 SENSITIVE Sensitive     OXACILLIN >=4 RESISTANT Resistant     TETRACYCLINE <=1 SENSITIVE Sensitive     VANCOMYCIN 1 SENSITIVE Sensitive     TRIMETH/SULFA <=10 SENSITIVE Sensitive     CLINDAMYCIN <=0.25 SENSITIVE Sensitive     RIFAMPIN <=0.5 SENSITIVE Sensitive     Inducible Clindamycin NEGATIVE Sensitive     *  FEW METHICILLIN RESISTANT STAPHYLOCOCCUS AUREUS  MRSA PCR Screening     Status: None   Collection Time: 01/18/20  8:56 AM   Specimen: Nasal Mucosa; Nasopharyngeal  Result Value Ref Range Status   MRSA by PCR NEGATIVE NEGATIVE Final    Comment:        The GeneXpert MRSA Assay (FDA approved for NASAL specimens only), is one component of a comprehensive MRSA colonization surveillance program. It is not intended to diagnose MRSA infection nor to guide or monitor treatment for MRSA infections. Performed at [facility], [address], [place], [place] [address]          Radiology Studies: No results found.      Scheduled Meds:  amantadine  100 mg Per Tube BID   amiodarone  200 mg Per Tube Daily   aspirin  325 mg Per Tube Daily   atorvastatin  40 mg Per Tube Daily   chlorhexidine gluconate (MEDLINE KIT)  15 mL Mouth Rinse BID   Chlorhexidine Gluconate Cloth  6 each Topical Q0600    darbepoetin (ARANESP) injection - DIALYSIS  100 mcg Intravenous Q Fri-HD   docusate  100 mg Per Tube BID   feeding supplement (PRO-STAT SUGAR FREE 64)  30 mL Per Tube Daily   Gerhardt's butt cream   Topical TID   insulin aspart  0-20 Units Subcutaneous Q4H   levETIRAcetam  750 mg Per Tube Q24H   mouth rinse  15 mL Mouth Rinse 10 times per day   multivitamin  1 tablet Per Tube QHS   neomycin-bacitracin-polymyxin  1 application Topical Daily   pantoprazole sodium  40 mg Per Tube QHS   phenytoin  100 mg Per Tube Q8H   polyethylene glycol  17 g Per Tube TID   senna-docusate  1 tablet Per Tube BID   sodium chloride flush  10-40 mL Intracatheter Q12H   Continuous Infusions:  sodium chloride Stopped (12/30/19 2139)   sodium chloride     sodium chloride     feeding supplement (NEPRO CARB STEADY) 1,000 mL (01/19/20 1259)          Aline August, MD [facility] 01/20/2020, 7:21 AM

## 2020-01-20 NOTE — Progress Notes (Signed)
SLP Cancellation Note  Patient Details Name: ZADIE DEEMER MRN: [id number] DOB: [birth year]   Cancelled treatment:       Reason Eval/Treat Not Completed: Medical issues which prohibited therapy. Pt alert and nodding Y/N appropriately, but ventilated this am. Will f/u for PMSV and swallow eval as able.    Elan Mcelvain, Katherene Ponto 01/20/2020, 10:53 AM

## 2020-01-20 NOTE — Progress Notes (Signed)
Physical Therapy Treatment Patient Details Name: Krystal Little MRN: [id number] DOB: [birth year] Today's Date: 01/20/2020    History of Present Illness Pt is 64 y.o. female initially admitted for stroke. 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 Pt s/p R hemicraniectomy with bone flap placed in abdomen on 4/18.  PMH HTN, DM2, chronic a. Fib (coumadin) s/p pace maker.  She went to inpatient rehab and on May 29 readmitted to hospital in the setting of tachypnea and an enlarging left-sided pleural effusion. Thoracentesis was performed which showed a hemothorax.  A CT chest was then ordered showing obstruction of her left mainstem bronchus and a loculated left pleural effusion. She had bronchoscopy and L chest tube placed 5/30.  She was intubated 5/30-12/19/19. Pt reintubated 6/6 and underwent lt thoracotomy to drain hemothorax. Post op found to have craniotomy area fuller and CT showed  hemorrhage into the posterior R MCA distribution. Pt developed AKI and CRRT 6/11- 6/20. Intermittent HD initiated. Trach placed 6/22.     PT Comments    Pt alert and following commands with rt side. Tolerated EOB with max assist.    Follow Up Recommendations  LTACH     Equipment Recommendations  Other (comment) (To be determined at next venue)    Recommendations for Other Services       Precautions / Restrictions Precautions Precautions: Fall Precaution Comments: R hemicraniectomy- bone flap in R abdomen, needs helmet for OOB,  Restrictions Weight Bearing Restrictions: No    Mobility  Bed Mobility Overal bed mobility: Needs Assistance Bed Mobility: Supine to Sit;Sit to Supine     Supine to sit: +2 for physical assistance;Total assist;HOB elevated Sit to supine: +2 for physical assistance;Total assist   General bed mobility comments: Pt assisting to move her RLE.  Transfers                    Ambulation/Gait                 Stairs              Wheelchair Mobility    Modified Rankin (Stroke Patients Only) Modified Rankin (Stroke Patients Only) Pre-Morbid Rankin Score: No symptoms Modified Rankin: Severe disability     Balance Overall balance assessment: Needs assistance Sitting-balance support: Bilateral upper extremity supported;Feet unsupported Sitting balance-Leahy Scale: Zero Sitting balance - Comments: Pt max assist to maintaing sitting EOB. Sat EOB x 15 minutes. Worked on reaching with RUE, LE ex's, upright posture with shoulders back and head up. Neck ROM. Worked on lateral leans to forearms Postural control: Posterior lean;Left lateral lean                                  Cognition Arousal/Alertness: Awake/alert (with stimuli) Behavior During Therapy: Flat affect Overall Cognitive Status: Difficult to assess                         Following Commands: Follows one step commands consistently;Follows one step commands with increased time (with rt side)       General Comments: By giving pt choices she is oriented to place and time.       Exercises General Exercises - Lower Extremity Long Arc Quad: AAROM;5 reps;Seated    General Comments        Pertinent Vitals/Pain Faces Pain Scale: No hurt    Home  Living                      Prior Function            PT Goals (current goals can now be found in the care plan section) Acute Rehab PT Goals Patient Stated Goal: Pt trached Progress towards PT goals: Progressing toward goals    Frequency    Min 2X/week      PT Plan Current plan remains appropriate    Co-evaluation PT/OT/SLP Co-Evaluation/Treatment: Yes Reason for Co-Treatment: Complexity of the patient's impairments (multi-system involvement);For patient/therapist safety PT goals addressed during session: Mobility/safety with mobility;Balance        AM-PAC PT "6 Clicks" Mobility   Outcome Measure  Help needed turning from your back to your side  while in a flat bed without using bedrails?: Total Help needed moving from lying on your back to sitting on the side of a flat bed without using bedrails?: Total Help needed moving to and from a bed to a chair (including a wheelchair)?: Total Help needed standing up from a chair using your arms (e.g., wheelchair or bedside chair)?: Total Help needed to walk in hospital room?: Total Help needed climbing 3-5 steps with a railing? : Total 6 Click Score: 6    End of Session Equipment Utilized During Treatment: Oxygen;Other (comment) (trach collar) Activity Tolerance: Patient tolerated treatment well Patient left: in bed;with call bell/phone within reach   PT Visit Diagnosis: Other abnormalities of gait and mobility (R26.89);Hemiplegia and hemiparesis Hemiplegia - Right/Left: Left Hemiplegia - dominant/non-dominant: Non-dominant Hemiplegia - caused by: Cerebral infarction;Nontraumatic intracerebral hemorrhage     Time: 0935-1009 PT Time Calculation (min) (ACUTE ONLY): 34 min  Charges:  $Therapeutic Activity: 8-22 mins                     [place] Pager [phone] Office [place] 01/20/2020, 10:29 AM

## 2020-01-20 NOTE — Progress Notes (Signed)
Patient ID: Krystal Little, female   DOB: Mar 11, 1956, 64 y.o.   MRN: [id number] S: More awake and alert this morning.  Able to follow commands.  Worked with PT to sit up on bedside O:BP (!) 138/59   Pulse 61   Temp 99 F (37.2 C) (Oral)   Resp (!) 24   Ht '5\' 4"'  (1.626 m)   Wt 62.3 kg   SpO2 99%   BMI 23.58 kg/m   Intake/Output Summary (Last 24 hours) at 01/20/2020 1042 Last data filed at 01/20/2020 0700 Gross per 24 hour  Intake 945 ml  Output 3500 ml  Net -2555 ml   Intake/Output: I/O last 3 completed shifts: In: 1575 [NG/GT:1575] Out: 3670 [Other:3500; Stool:170]  Intake/Output this shift:  No intake/output data recorded. Weight change: -3.1 kg Gen: NAD CVS: no rub Resp: cta Abd: +BS, soft, Nt/nd Ext: no edema  Recent Labs  Lab 01/14/20 0703 01/15/20 0330 01/16/20 0504 01/17/20 0505 01/18/20 0500 01/19/20 0319 01/20/20 0554  NA 133* 134* 131* 132* 131* 132* 130*  K 4.9 3.1* 3.7 3.8 4.6 4.4 3.4*  CL 95* 99 94* 97* 96* 95* 93*  CO2 '25 28 23 26 24 25 26  ' GLUCOSE 117* 141* 181* 179* 133* 135* 134*  BUN 50* 35* 57* 37* 60* 81* 48*  CREATININE 3.51* 2.69* 3.82* 2.79* 4.17* 4.92* 3.26*  ALBUMIN 1.4* 1.3* 1.4* 1.3* 1.2* 1.2* 1.3*  CALCIUM 8.0* 7.9* 8.0* 8.0* 7.8* 7.9* 7.9*  PHOS 5.1* 3.9 4.9* 3.2 5.1* 6.1* 3.5  AST  --   --   --   --   --   --  189*  ALT  --   --   --   --   --   --  70*   Liver Function Tests: Recent Labs  Lab 01/18/20 0500 01/19/20 0319 01/20/20 0554  AST  --   --  189*  ALT  --   --  70*  ALKPHOS  --   --  860*  BILITOT  --   --  0.6  PROT  --   --  8.4*  ALBUMIN 1.2* 1.2* 1.3*   No results for input(s): LIPASE, AMYLASE in the last 168 hours. No results for input(s): AMMONIA in the last 168 hours. CBC: Recent Labs  Lab 01/15/20 0330 01/15/20 0330 01/16/20 0504 01/16/20 0504 01/17/20 0505 01/19/20 0700 01/20/20 0554  WBC 11.3*   < > 11.5*   < > 12.8* 12.4* 10.6*  NEUTROABS 8.9*  --   --   --  10.1*  --  8.0*  HGB 8.7*   < >  8.4*   < > 8.9* 8.1* 8.1*  HCT 28.6*   < > 27.9*   < > 29.3* 26.3* 26.8*  MCV 97.9  --  96.9  --  98.0 97.0 97.1  PLT 199   < > 168   < > 175 131* 143*   < > = values in this interval not displayed.   Cardiac Enzymes: No results for input(s): CKTOTAL, CKMB, CKMBINDEX, TROPONINI in the last 168 hours. CBG: Recent Labs  Lab 01/19/20 1604 01/19/20 1939 01/19/20 2327 01/20/20 0337 01/20/20 0801  GLUCAP 126* 107* 145* 111* 134*    Iron Studies: No results for input(s): IRON, TIBC, TRANSFERRIN, FERRITIN in the last 72 hours. Studies/Results: No results found. Marland Kitchen amantadine  100 mg Per Tube BID  . amiodarone  200 mg Per Tube Daily  . aspirin  325 mg Per Tube Daily  .  atorvastatin  40 mg Per Tube Daily  . chlorhexidine gluconate (MEDLINE KIT)  15 mL Mouth Rinse BID  . Chlorhexidine Gluconate Cloth  6 each Topical Q0600  . darbepoetin (ARANESP) injection - DIALYSIS  100 mcg Intravenous Q Fri-HD  . docusate  100 mg Per Tube BID  . feeding supplement (PRO-STAT SUGAR FREE 64)  30 mL Per Tube Daily  . Gerhardt's butt cream   Topical TID  . insulin aspart  0-20 Units Subcutaneous Q4H  . levETIRAcetam  750 mg Per Tube Q24H  . mouth rinse  15 mL Mouth Rinse 10 times per day  . multivitamin  1 tablet Per Tube QHS  . pantoprazole sodium  40 mg Per Tube QHS  . phenytoin  100 mg Per Tube Q8H  . polyethylene glycol  17 g Per Tube TID  . senna-docusate  1 tablet Per Tube BID  . sodium chloride flush  10-40 mL Intracatheter Q12H    BMET    Component Value Date/Time   NA 130 (L) 01/20/2020 0554   NA 140 08/18/2019 1120   K 3.4 (L) 01/20/2020 0554   CL 93 (L) 01/20/2020 0554   CO2 26 01/20/2020 0554   GLUCOSE 134 (H) 01/20/2020 0554   BUN 48 (H) 01/20/2020 0554   BUN 12 08/18/2019 1120   CREATININE 3.26 (H) 01/20/2020 0554   CREATININE 0.73 05/18/2016 0919   CALCIUM 7.9 (L) 01/20/2020 0554   GFRNONAA 14 (L) 01/20/2020 0554   GFRNONAA 89 05/18/2016 0919   GFRAA 16 (L) 01/20/2020 0554    GFRAA >89 05/18/2016 0919   CBC    Component Value Date/Time   WBC 10.6 (H) 01/20/2020 0554   RBC 2.76 (L) 01/20/2020 0554   HGB 8.1 (L) 01/20/2020 0554   HGB 15.2 08/18/2019 1120   HCT 26.8 (L) 01/20/2020 0554   HCT 45.9 08/18/2019 1120   PLT 143 (L) 01/20/2020 0554   PLT 168 08/18/2019 1120   MCV 97.1 01/20/2020 0554   MCV 94 08/18/2019 1120   MCH 29.3 01/20/2020 0554   MCHC 30.2 01/20/2020 0554   RDW 19.3 (H) 01/20/2020 0554   RDW 12.2 08/18/2019 1120   LYMPHSABS 1.1 01/20/2020 0554   MONOABS 1.2 (H) 01/20/2020 0554   EOSABS 0.1 01/20/2020 0554   BASOSABS 0.1 01/20/2020 0554    Assessment/Plan:  1. AKI- due to ischemic ATN in setting of hemorrhagic shock requiring pressor support and initiated on CRRT 6/11-6/20/21 and transitioned to IHD on 6/21 and has remained oliguric and dialysis dependent.   1. Will continue with HD on MWF schedule for now. 2. Has been tolerating HD 3. No signs of renal recovery at this time. 2. Left sided hemothorax- s/p VATS on 6/6 and s/p removal of one chest tube 6/21 3. RMCA stroke- s/p thrombectomy 4/16 c/b worsening cerebral edema with mass effect s/p decompressive right hemicraniectomy and placement of bone flap on 11/02/19, new ICH 12/24/2019. 4. 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 Chronic atrial fibrillation- on amio and asa 6. HTN- stable 7. Seizure disorder- per neuro 8. Nutrition- s/p PEG by IR on 6/29 9. ABLA/Anemia of critical illness- s/p multiple transfusions 10. Disposition- will need LTAC but poor overall prognosis.  Continue with ongoing discussions with family regarding poor prognosis for meaningful recovery.  Donetta Potts, MD [employer] [phone]

## 2020-01-21 ENCOUNTER — Inpatient Hospital Stay (HOSPITAL_COMMUNITY): Payer: Medicare Other

## 2020-01-21 DIAGNOSIS — R1310 Dysphagia, unspecified: Secondary | ICD-10-CM

## 2020-01-21 LAB — GLUCOSE, CAPILLARY
Glucose-Capillary: 102 mg/dL — ABNORMAL HIGH (ref 70–99)
Glucose-Capillary: 141 mg/dL — ABNORMAL HIGH (ref 70–99)
Glucose-Capillary: 151 mg/dL — ABNORMAL HIGH (ref 70–99)
Glucose-Capillary: 160 mg/dL — ABNORMAL HIGH (ref 70–99)
Glucose-Capillary: 171 mg/dL — ABNORMAL HIGH (ref 70–99)
Glucose-Capillary: 72 mg/dL (ref 70–99)

## 2020-01-21 LAB — COMPREHENSIVE METABOLIC PANEL
ALT: 66 U/L — ABNORMAL HIGH (ref 0–44)
AST: 145 U/L — ABNORMAL HIGH (ref 15–41)
Albumin: 1.3 g/dL — ABNORMAL LOW (ref 3.5–5.0)
Alkaline Phosphatase: 840 U/L — ABNORMAL HIGH (ref 38–126)
Anion gap: 12 (ref 5–15)
BUN: 72 mg/dL — ABNORMAL HIGH (ref 8–23)
CO2: 26 mmol/L (ref 22–32)
Calcium: 8.1 mg/dL — ABNORMAL LOW (ref 8.9–10.3)
Chloride: 92 mmol/L — ABNORMAL LOW (ref 98–111)
Creatinine, Ser: 4.31 mg/dL — ABNORMAL HIGH (ref 0.44–1.00)
GFR calc Af Amer: 12 mL/min — ABNORMAL LOW (ref >60)
GFR calc non Af Amer: 10 mL/min — ABNORMAL LOW (ref >60)
Glucose, Bld: 168 mg/dL — ABNORMAL HIGH (ref 70–99)
Potassium: 3.9 mmol/L (ref 3.5–5.1)
Sodium: 130 mmol/L — ABNORMAL LOW (ref 135–145)
Total Bilirubin: 0.7 mg/dL (ref 0.3–1.2)
Total Protein: 8.6 g/dL — ABNORMAL HIGH (ref 6.5–8.1)

## 2020-01-21 LAB — CBC WITH DIFFERENTIAL/PLATELET
Abs Immature Granulocytes: 0.06 10*3/uL (ref 0.00–0.07)
Basophils Absolute: 0.1 10*3/uL (ref 0.0–0.1)
Basophils Relative: 1 %
Eosinophils Absolute: 0.2 10*3/uL (ref 0.0–0.5)
Eosinophils Relative: 2 %
HCT: 26.2 % — ABNORMAL LOW (ref 36.0–46.0)
Hemoglobin: 7.9 g/dL — ABNORMAL LOW (ref 12.0–15.0)
Immature Granulocytes: 1 %
Lymphocytes Relative: 10 %
Lymphs Abs: 1.3 10*3/uL (ref 0.7–4.0)
MCH: 28.9 pg (ref 26.0–34.0)
MCHC: 30.2 g/dL (ref 30.0–36.0)
MCV: 96 fL (ref 80.0–100.0)
Monocytes Absolute: 1.2 10*3/uL — ABNORMAL HIGH (ref 0.1–1.0)
Monocytes Relative: 10 %
Neutro Abs: 9.6 10*3/uL — ABNORMAL HIGH (ref 1.7–7.7)
Neutrophils Relative %: 76 %
Platelets: 137 10*3/uL — ABNORMAL LOW (ref 150–400)
RBC: 2.73 MIL/uL — ABNORMAL LOW (ref 3.87–5.11)
RDW: 19 % — ABNORMAL HIGH (ref 11.5–15.5)
WBC: 12.4 10*3/uL — ABNORMAL HIGH (ref 4.0–10.5)
nRBC: 0 % (ref 0.0–0.2)

## 2020-01-21 LAB — PHOSPHORUS: Phosphorus: 4.6 mg/dL (ref 2.5–4.6)

## 2020-01-21 MED ORDER — NEPRO/CARBSTEADY PO LIQD
237.0000 mL | Freq: Three times a day (TID) | ORAL | Status: DC
  Administered 2020-01-21 – 2020-01-23 (×8): 237 mL

## 2020-01-21 MED ORDER — PROSOURCE TF PO LIQD
45.0000 mL | Freq: Two times a day (BID) | ORAL | Status: DC
  Administered 2020-01-21 – 2020-01-24 (×7): 45 mL
  Filled 2020-01-21 (×8): qty 45

## 2020-01-21 NOTE — Procedures (Signed)
I was present at this dialysis session. I have reviewed the session itself and made appropriate changes.   Vital signs in last 24 hours:  Temp:  [97.9 F (36.6 C)-99.9 F (37.7 C)] 98.4 F (36.9 C) (07/07 0807) Pulse Rate:  [59-68] 64 (07/07 0853) Resp:  [20-35] 28 (07/07 0853) BP: (104-187)/(50-87) 132/60 (07/07 0853) SpO2:  [96 %-100 %] 100 % (07/07 0853) FiO2 (%):  [30 %-40 %] 40 % (07/07 0853) Weight:  [59.6 kg-60.9 kg] 60.9 kg (07/07 0658) Weight change: 0 kg Filed Weights   01/21/20 0015 01/21/20 0435 01/21/20 0658  Weight: 59.6 kg 59.6 kg 60.9 kg    Recent Labs  Lab 01/21/20 0445  NA 130*  K 3.9  CL 92*  CO2 26  GLUCOSE 168*  BUN 72*  CREATININE 4.31*  CALCIUM 8.1*  PHOS 4.6    Recent Labs  Lab 01/17/20 0505 01/17/20 0505 01/19/20 0700 01/20/20 0554 01/21/20 0445  WBC 12.8*   < > 12.4* 10.6* 12.4*  NEUTROABS 10.1*  --   --  8.0* 9.6*  HGB 8.9*   < > 8.1* 8.1* 7.9*  HCT 29.3*   < > 26.3* 26.8* 26.2*  MCV 98.0   < > 97.0 97.1 96.0  PLT 175   < > 131* 143* 137*   < > = values in this interval not displayed.    Scheduled Meds: . amantadine  100 mg Per Tube BID  . amiodarone  200 mg Per Tube Daily  . aspirin  325 mg Per Tube Daily  . atorvastatin  40 mg Per Tube Daily  . chlorhexidine gluconate (MEDLINE KIT)  15 mL Mouth Rinse BID  . Chlorhexidine Gluconate Cloth  6 each Topical Q0600  . darbepoetin (ARANESP) injection - DIALYSIS  100 mcg Intravenous Q Fri-HD  . docusate  100 mg Per Tube BID  . feeding supplement (PRO-STAT SUGAR FREE 64)  30 mL Per Tube Daily  . Gerhardt's butt cream   Topical TID  . insulin aspart  0-20 Units Subcutaneous Q4H  . levETIRAcetam  750 mg Per Tube Q24H  . mouth rinse  15 mL Mouth Rinse 10 times per day  . multivitamin  1 tablet Per Tube QHS  . pantoprazole sodium  40 mg Per Tube QHS  . phenytoin  100 mg Per Tube Q8H  . polyethylene glycol  17 g Per Tube TID  . senna-docusate  1 tablet Per Tube BID  . sodium chloride  flush  10-40 mL Intracatheter Q12H   Continuous Infusions: . sodium chloride Stopped (12/30/19 2139)  . sodium chloride    . sodium chloride    . feeding supplement (NEPRO CARB STEADY) 45 mL/hr at 01/21/20 0015   PRN Meds:.sodium chloride, sodium chloride, sodium chloride, acetaminophen, alteplase, bisacodyl, fentaNYL (SUBLIMAZE) injection, heparin, hydrALAZINE, lidocaine (PF), lidocaine-prilocaine, midazolam, ondansetron (ZOFRAN) IV, oxyCODONE, pentafluoroprop-tetrafluoroeth, senna-docusate, sodium chloride flush   Donetta Potts,  MD 01/21/2020, 9:03 AM

## 2020-01-21 NOTE — Progress Notes (Signed)
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  On 12/13/2019, PCCM was consulted for tachypnea and enlarging left-sided pleural effusion, thoracentesis showed hemothorax.  CT of the chest showed obstruction of her left mainstem bronchus and loculated left pleural effusion.  She was mated to ICU under PCCM service on 11/25/2019 for chest tube placement and bronchoscopy.  She underwent intrapleural TPA but required VATS eventually.  Her course was complicated by ICH, seizures, AKI, requiring CRRT.  Patient subsequently had tracheostomy placed on 12/22/2019, NG tube and tunneled vascular catheter placed on 01/13/2020.  Subsequently patient was started on intermittent hemodialysis.  Significant Hospital events 4/16 IR thrombectomy R MCA stroke 5/6 transfer to [facility] 5/29 PCCM consult left pleural effusion, thoracentesis 5/30 worsening tachypnea. Moved to ICU for intubation to facilitate bronchoscopy and chest tube placement 5/31 TPA/ pulmonzyme started for 3 days for persistent left effusion 6/01 Afebrile, low-dose Levophed, Precedex, Minimal output on chest tube- pulmozyme / tpa x 2,  6/02 pulmozyme and TPA completed 3 days 6/03 afib with RVR placed on amio gtt overnight , 1U PRBC for Hb 7.1  6/05 extubated successfully, Repeat TPA/Pulmonzyme given again  6/06 acute decompensation overnight, hemorrhagic shock >to OR for left VATS with 2L hemothorax drained. CT head with new ICH>neurosurgery consulted 6/09  More awake but agitated on PSV weaning  6/12: Seizures + on EEG 6/22 trach 6/29 g tube and tunneled vascular catheter placed 7/2: Care transferred to [facility]  Subjective   Patient seen and examined, could not tolerate trach collar, back on mechanical ventilation.   Assessment/Plan:     1. Acute respiratory failure with hypoxia s/p tracheostomy-mucous plugging, suspected aspiration, left-sided loculated effusion complicated with hemothorax status post VATS decortication.  Patient has a long hospitalization and was intubated but subsequently underwent tracheostomy on 01/06/2020.  Patient is requiring mechanical ventilation intermittently.  Wean off as able. 2. Acute kidney injury-oliguric ischemic ATN secondary to hemorrhagic shock.  Refractory to IV diuretics, started on CRRT from 12/26/2019 6 08/23/2019.  Subsequently started on hemodialysis from 01/05/2020.  Patient is status post right IJ tunneled dialysis catheter placement by IR on 01/13/2020.  Nephrology following and managing hemodialysis.  Patient is a poor candidate for long-term hemodialysis in this current state. 3. History of right MCA stroke-s/p thrombectomy with residual left-sided paralysis s/p right hemicraniectomy complicated by ICH after intrapleural TPA. 4. Dysphagia-patient without new ICH on 12/30/2019.  Manage medically as per neurosurgery recommendations.  G-tube was placed on 01/13/2020 and feeding has been started. 5. 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  EEG had shown status epilepticus.  Continue phenytoin via G-tube.  Continue Keppra.  As needed Ativan. 6. Chronic atrial fibrillation-patient has history of sick sinus syndrome s/p pacemaker placement.  Continue amiodarone, aspirin. 7. Protein calorie malnutrition-continue tube feeding per nutrition. 8. Diabetes mellitus type 2 with hyperglycemia and hypoglycemia episodes-continue sliding scale insulin with NovoLog. 9. Hyponatremia-patient has mild  hyponatremia, will continue to monitor. 10. Anemia chronic disease-secondary to chronic illness hemoglobin has been stable.  Patient is received multiple PRBCs transfusions.  Will transfuse for hemoglobin 7.0.  11. Hypertension-blood pressure is controlled. 12. Overall poor prognosis.  Patient is very deconditioned and has poor prognosis.  Palliative care was consulted and has signed off.  Patient will need LTAC placement.    SpO2: 100 % O2 Flow Rate (L/min): 10 L/min FiO2 (%): 40 %   COVID-19 Labs  No results for input(s): DDIMER, FERRITIN, LDH, CRP in the last 72 hours.  Lab Results  Component Value Date   [place] NEGATIVE 10/31/2019     CBG: Recent Labs  Lab 01/20/20 2027 01/20/20 2356 01/21/20 0451 01/21/20 0809 01/21/20 1209  GLUCAP 129* 139* 160* 72 141*    CBC: Recent Labs  Lab 01/15/20 0330 01/15/20 0330 01/16/20 0504 01/17/20 0505 01/19/20 0700 01/20/20 0554 01/21/20 0445  WBC 11.3*   < > 11.5* 12.8* 12.4* 10.6* 12.4*  NEUTROABS 8.9*  --   --  10.1*  --  8.0* 9.6*  HGB 8.7*   < > 8.4* 8.9* 8.1* 8.1* 7.9*  HCT 28.6*   < > 27.9* 29.3* 26.3* 26.8* 26.2*  MCV 97.9   < > 96.9 98.0 97.0 97.1 96.0  PLT 199   < > 168 175 131* 143* 137*   < > = values in this interval not displayed.    Basic Metabolic Panel: Recent Labs  Lab 01/16/20 0504 01/16/20 0504 01/17/20 0505 01/18/20 0500 01/19/20 0319 01/20/20 0554 01/21/20 0445  NA 131*   < > 132* 131* 132* 130* 130*  K 3.7   < > 3.8 4.6 4.4 3.4* 3.9  CL 94*   < > 97* 96* 95* 93* 92*  CO2 23   < > '26 24 25 26 26  ' GLUCOSE 181*   < > 179* 133* 135* 134* 168*  BUN 57*   < > 37* 60* 81* 48* 72*  CREATININE 3.82*   < > 2.79* 4.17* 4.92* 3.26* 4.31*  CALCIUM 8.0*   < > 8.0* 7.8* 7.9* 7.9* 8.1*  MG 2.5*  --   --   --   --  2.3  --   PHOS 4.9*   < > 3.2 5.1* 6.1* 3.5 4.6   < > = values in this interval not displayed.     Liver Function Tests: Recent Labs  Lab 01/17/20 0505 01/18/20 0500 01/19/20 0319  01/20/20 0554 01/21/20 0445  AST  --   --   --  189* 145*  ALT  --   --   --  70* 66*  ALKPHOS  --   --   --  860* 840*  BILITOT  --   --   --  0.6 0.7  PROT  --   --   --  8.4* 8.6*  ALBUMIN 1.3* 1.2* 1.2* 1.3* 1.3*        DVT prophylaxis: SCDs  Code Status: Full code  Family Communication: No family at bedside    Status is: Inpatient  Dispo: The patient is from: Home              Anticipated d/c is to: LTAC              Anticipated d/c date is: 01/23/2020              Patient currently not medically stable for discharge  Barrier to discharge-requiring CRRT, mechanical ventilation        Scheduled medications:  . amantadine  100 mg Per Tube BID  . amiodarone  200 mg Per Tube Daily  . aspirin  325 mg  Per Tube Daily  . atorvastatin  40 mg Per Tube Daily  . chlorhexidine gluconate (MEDLINE KIT)  15 mL Mouth Rinse BID  . Chlorhexidine Gluconate Cloth  6 each Topical Q0600  . darbepoetin (ARANESP) injection - DIALYSIS  100 mcg Intravenous Q Fri-HD  . docusate  100 mg Per Tube BID  . feeding supplement (NEPRO CARB STEADY)  237 mL Per Tube TID PC & HS  . feeding supplement (PROSource TF)  45 mL Per Tube BID  . Gerhardt's butt cream   Topical TID  . insulin aspart  0-20 Units Subcutaneous Q4H  . levETIRAcetam  750 mg Per Tube Q24H  . mouth rinse  15 mL Mouth Rinse 10 times per day  . multivitamin  1 tablet Per Tube QHS  . pantoprazole sodium  40 mg Per Tube QHS  . phenytoin  100 mg Per Tube Q8H  . polyethylene glycol  17 g Per Tube TID  . senna-docusate  1 tablet Per Tube BID  . sodium chloride flush  10-40 mL Intracatheter Q12H    Consultants:  PCCM  Palliative care  Nephrology  Neurology  Neurosurgery  IR  Procedures: 12/13/2019 thoracentesis: 1.5 L bloody fluid Bronchoscopy on 12/13/2019 ETT: 12/01/2019-01/06/2020 Tracheostomy: 01/06/2020 onwards Left pigtail chest tube 12/05/2019-01/14/2020 [facility] HD cath from 12/27/2019-01/13/2020 G-tube and tunneled  vascular catheter placement on 01/13/2020  Overnight EEG on 12/27/2019: Right frontocentral seizures x2   Antibiotics:   Anti-infectives (From admission, onward)   Start     Dose/Rate Route Frequency Ordered Stop   01/15/20 0745  ceFAZolin (ANCEF) IVPB 2g/100 mL premix  Status:  Discontinued        2 g 200 mL/hr over 30 Minutes Intravenous  Once 01/15/20 0739 01/18/20 0938   01/13/20 1645  ceFAZolin (ANCEF) IVPB 2g/100 mL premix        2 g 200 mL/hr over 30 Minutes Intravenous To Radiology 01/13/20 1630 01/13/20 1640   01/13/20 1630  ceFAZolin (ANCEF) IVPB 2g/100 mL premix  Status:  Discontinued        2 g 200 mL/hr over 30 Minutes Intravenous To ShortStay Surgical 01/13/20 1625 01/13/20 1906   01/13/20 0800  ceFAZolin (ANCEF) IVPB 2g/100 mL premix        2 g 200 mL/hr over 30 Minutes Intravenous To Radiology 01/12/20 1007 01/13/20 1836   01/12/20 1030  ceFAZolin (ANCEF) IVPB 2g/100 mL premix  Status:  Discontinued        2 g 200 mL/hr over 30 Minutes Intravenous To Radiology 01/12/20 0927 01/12/20 1007   12/27/19 1000  ceFEPIme (MAXIPIME) 2 g in sodium chloride 0.9 % 100 mL IVPB  Status:  Discontinued        2 g 200 mL/hr over 30 Minutes Intravenous Every 12 hours 12/26/19 1412 12/27/19 1017   12/26/19 1000  ceFEPIme (MAXIPIME) 1 g in sodium chloride 0.9 % 100 mL IVPB  Status:  Discontinued        1 g 200 mL/hr over 30 Minutes Intravenous Every 24 hours 12/25/19 1152 12/26/19 1412   01/08/2020 1244  ceFAZolin (ANCEF) IVPB 2g/100 mL premix  Status:  Discontinued        2 g 200 mL/hr over 30 Minutes Intravenous 30 min pre-op 01/01/2020 1244 01/11/2020 1823   12/27/2019 0600  ceFEPIme (MAXIPIME) 1 g in sodium chloride 0.9 % 100 mL IVPB  Status:  Discontinued        1 g 200 mL/hr over 30 Minutes Intravenous Every 8 hours 01/05/2020 4270  01/02/2020 0340   01/10/2020 0400  vancomycin (VANCOCIN) IVPB 1000 mg/200 mL premix        1,000 mg 200 mL/hr over 60 Minutes Intravenous  Once 01/12/2020 0337  01/10/2020 0653   12/25/2019 0400  ceFEPIme (MAXIPIME) 2 g in sodium chloride 0.9 % 100 mL IVPB  Status:  Discontinued        2 g 200 mL/hr over 30 Minutes Intravenous Every 24 hours 01/08/2020 0340 12/25/19 1152   12/20/19 2200  amoxicillin-clavulanate (AUGMENTIN) 500-125 MG per tablet 500 mg  Status:  Discontinued        1 tablet Oral 2 times daily 12/20/19 1412 01/08/2020 0337   12/20/19 1415  amoxicillin-clavulanate (AUGMENTIN) 875-125 MG per tablet 1 tablet  Status:  Discontinued        1 tablet Oral Every 12 hours 12/20/19 1407 12/20/19 1411   11/20/2019 1245  piperacillin-tazobactam (ZOSYN) IVPB 3.375 g  Status:  Discontinued        3.375 g 12.5 mL/hr over 240 Minutes Intravenous Every 8 hours 11/22/2019 1235 12/20/19 1407       Objective   Vitals:   01/21/20 1047 01/21/20 1100 01/21/20 1130 01/21/20 1436  BP: (!) 120/49 (!) 122/44    Pulse: 63 70  67  Resp: (!) 23 (!) 33  (!) 35  Temp: 99.3 F (37.4 C)     TempSrc: Oral     SpO2: 100% 100% 97% 100%  Weight: 60 kg     Height:        Intake/Output Summary (Last 24 hours) at 01/21/2020 1512 Last data filed at 01/21/2020 1047 Gross per 24 hour  Intake 630 ml  Output 3100 ml  Net -2470 ml    07/05 1901 - 07/07 0700 In: 1125  Out: 1100   Filed Weights   01/21/20 0435 01/21/20 0658 01/21/20 1047  Weight: 59.6 kg 60.9 kg 60 kg    Physical Examination:    General: Appears in no acute distress  Cardiovascular: S1-S2, regular  Respiratory: Clear to auscultation bilaterally  Abdomen: Abdomen is soft, nontender, no organomegaly  Extremities: No edema in the lower extremities  Neurologic: Alert, oriented x3, intact insight and judgment    Data Reviewed:   Recent Results (from the past 240 hour(s))  Culture, respiratory     Status: None   Collection Time: 01/14/20 12:46 PM   Specimen: Tracheal Aspirate; Respiratory  Result Value Ref Range Status   Specimen Description TRACHEAL ASPIRATE  Final   Special Requests NONE   Final   Gram Stain   Final    NO WBC SEEN NO ORGANISMS SEEN Performed at [facility], [address], [place], [place] [address]    Culture FEW METHICILLIN RESISTANT STAPHYLOCOCCUS AUREUS  Final   Report Status 01/17/2020 FINAL  Final   Organism ID, Bacteria METHICILLIN RESISTANT STAPHYLOCOCCUS AUREUS  Final      Susceptibility   Methicillin resistant staphylococcus aureus - MIC*    CIPROFLOXACIN >=8 RESISTANT Resistant     ERYTHROMYCIN >=8 RESISTANT Resistant     GENTAMICIN <=0.5 SENSITIVE Sensitive     OXACILLIN >=4 RESISTANT Resistant     TETRACYCLINE <=1 SENSITIVE Sensitive     VANCOMYCIN 1 SENSITIVE Sensitive     TRIMETH/SULFA <=10 SENSITIVE Sensitive     CLINDAMYCIN <=0.25 SENSITIVE Sensitive     RIFAMPIN <=0.5 SENSITIVE Sensitive     Inducible Clindamycin NEGATIVE Sensitive     * FEW METHICILLIN RESISTANT STAPHYLOCOCCUS AUREUS  MRSA PCR Screening  Status: None   Collection Time: 01/18/20  8:56 AM   Specimen: Nasal Mucosa; Nasopharyngeal  Result Value Ref Range Status   MRSA by PCR NEGATIVE NEGATIVE Final    Comment:        The GeneXpert MRSA Assay (FDA approved for NASAL specimens only), is one component of a comprehensive MRSA colonization surveillance program. It is not intended to diagnose MRSA infection nor to guide or monitor treatment for MRSA infections. Performed at [facility], [place] [address], [place], [place] [address]     No results for input(s): LIPASE, AMYLASE in the last 168 hours. No results for input(s): AMMONIA in the last 168 hours.  Cardiac Enzymes: No results for input(s): CKTOTAL, CKMB, CKMBINDEX, TROPONINI in the last 168 hours. BNP (last 3 results) Recent Labs    11/11/19 2226  BNP 93.9    Studies:  DG CHEST PORT 1 VIEW  Result Date: 01/21/2020 CLINICAL DATA:  Dyspnea.  Tracheostomy tube. EXAM: PORTABLE CHEST 1 VIEW COMPARISON:  01/18/2020 FINDINGS: Tracheostomy tube projects over the tracheal air column. Right  sided dialysis catheter noted with tip projecting over the lower SVC. Dual lead pacer noted. Left-sided PICC line tip: Lower SVC. Moderate left pleural effusion with blunting left costophrenic angle similar to prior. Hazy airspace opacity in the left mid lung and left lung base. Indistinct pulmonary vasculature. Improved aeration in the right lower lobe along the hemidiaphragm compared to the 01/18/2020 exam. Bony demineralization. IMPRESSION: 1. Stable moderate left pleural effusion with hazy airspace opacity in the left mid lung and left lung base. 2. Improved aeration in the right lower lobe along the hemidiaphragm. 3. Indistinct pulmonary vasculature may reflect pulmonary venous hypertension. Electronically Signed   By: Van Clines M.D.   On: 01/21/2020 11:08       [place]   [facility] If 7PM-7AM, please contact night-coverage at [email or web address], Office  [phone]   01/21/2020, 3:12 PM  LOS: 38 days

## 2020-01-21 NOTE — TOC Progression Note (Addendum)
Transition of Care [facility]) - Progression Note    Patient Details  Name: Krystal Little MRN: [id number] Date of Birth: 19-Sep-1955  Transition of Care [facility] CM/SW [place], RN Phone Number: 01/21/2020, 8:48 AM  Clinical Narrative:    [facility], Lesia Sago, [facility], returned a call this morning to notify me that they do not have any HD LTAC beds available for admission for this patient.  Will continue to follow for possible placement for this patient.  7/7/21Burgess Estelle from [facility] notified me this morning that no HD beds are available at this time.  The patient's husband declined admission to [facility] but continuing to follow for possible placement options for the patient.   Expected Discharge Plan: Long Term Acute Care (LTAC) Barriers to Discharge: Continued Medical Work up (Patient received trach yesterday and is starting trach trials today.  No PEG at this time.)  Expected Discharge Plan and Services Expected Discharge Plan: Long Term Acute Care (LTAC)   Discharge Planning Services: CM Consult Post Acute Care Choice: Long Term Acute Care (LTAC) (Prefers 1. [facility] and 2. [facility]) Living arrangements for the past 2 months: Single Family Home                                       Social Determinants of Health (SDOH) Interventions    Readmission Risk Interventions Readmission Risk Prevention Plan 01/07/2020  Transportation Screening Complete  Medication Review Press photographer) Complete  PCP or Specialist appointment within 3-5 days of discharge Complete  HRI or Home Care Consult Complete  SW Recovery Care/Counseling Consult Complete  Palliative Care Screening Complete  Skilled Nursing Facility Complete  Some recent data might be hidden

## 2020-01-21 NOTE — Progress Notes (Signed)
Nutrition Follow-up  DOCUMENTATION CODES:   Not applicable  INTERVENTION:   Tube Feeding via PEG: Change to bolus feedings Nepro  237 mL QID Pro-Source TF 45 mL BID Provides 98 g of protein, 1780 kcals and 688 mL of free water Meets 100% estimated calorie and protein needs  Continue Rena-Vite daily  NUTRITION DIAGNOSIS:   Inadequate oral intake related to acute illness as evidenced by NPO status.  Being addressed via TF   GOAL:   Patient will meet greater than or equal to 90% of their needs  Progressing  MONITOR:   Vent status, TF tolerance, Labs, Weight trends  REASON FOR ASSESSMENT:   Consult, Ventilator Enteral/tube feeding initiation and management  ASSESSMENT:   64 yo female admitted from [place] after being at [facility] in April in setting of R. MCA stroke requiring thrombectomy, developed significant brain edema requiring craniectomy. Pt developed L pleural effusion on Rehab requiring thoracentesis and ultimately required intubation due to resp failure from occlusion of L. Mainstem bronchus and large L hemothorax, bronch and chest tube placement. Pt did require Cortrak placement with TF initially but was taking po prior to readmission from Rehab. PMH DM, HTN, R. MCA stroke, SSS  5/29 Thoracentesis 5/30 Intubated, Chest tube 6/04Cortrak placed 6/06 Left VATS with drainage of left hemothorax 6/11 CRRT initiated 6/12 Seizures+ on EEG 6/20 CRRT stopped 6/21 First iHD 6/22 Trach placement 6/29 20 Fr. PEG tube placed , RIJ tunneled HD cath placed  Pt continues to require vent support at night via trach, TC as tolerated Received iHD today, currently on MWF dialysis schedule  Pt has been working with PT/PT/SLP  Nepro at 45 ml/hr, Pro-Stat 30 mL daily via PEG tube; tolerating   Appears current dry weight is around 59 kg  Labs: potassium 3.9 (wdl), phosphorus 4.6 (wdl), sodium 130 (L), CBGs 72-160 Meds: Rena-Vite, ss novolog  Diet Order:   Diet Order             Diet NPO time specified  Diet effective now                 EDUCATION NEEDS:   Not appropriate for education at this time  Skin:  Skin Assessment: Skin Integrity Issues: Skin Integrity Issues:: Incisions, Other (Comment) Incisions: incision to groin, head, abdomen Other: fissure to mid coccyx, venous stasis ulcers on legs, MASD to buttock/sacrum  Last BM:  7/7  Height:   Ht Readings from Last 1 Encounters:  11/18/2019 5\' 4"  (1.626 m)    Weight:   Wt Readings from Last 1 Encounters:  01/21/20 60 kg    Ideal Body Weight:     BMI:  Body mass index is 22.71 kg/m.  Estimated Nutritional Needs:   Kcal:  2423-5361 kcals  Protein:  90-120 g  Fluid:  1000 mL plus UOP   Kerman Passey MS, RDN, LDN, CNSC Registered Dietitian III Clinical Nutrition RD Pager and On-Call Pager Number Located in [place]

## 2020-01-22 LAB — GLUCOSE, CAPILLARY
Glucose-Capillary: 108 mg/dL — ABNORMAL HIGH (ref 70–99)
Glucose-Capillary: 104 mg/dL — ABNORMAL HIGH (ref 70–99)
Glucose-Capillary: 163 mg/dL — ABNORMAL HIGH (ref 70–99)
Glucose-Capillary: 191 mg/dL — ABNORMAL HIGH (ref 70–99)
Glucose-Capillary: 201 mg/dL — ABNORMAL HIGH (ref 70–99)
Glucose-Capillary: 91 mg/dL (ref 70–99)

## 2020-01-22 LAB — CBC WITH DIFFERENTIAL/PLATELET
Abs Immature Granulocytes: 0.08 10*3/uL — ABNORMAL HIGH (ref 0.00–0.07)
Basophils Absolute: 0.1 10*3/uL (ref 0.0–0.1)
Basophils Relative: 0 %
Eosinophils Absolute: 0.1 10*3/uL (ref 0.0–0.5)
Eosinophils Relative: 1 %
HCT: 28.9 % — ABNORMAL LOW (ref 36.0–46.0)
Hemoglobin: 8.6 g/dL — ABNORMAL LOW (ref 12.0–15.0)
Immature Granulocytes: 1 %
Lymphocytes Relative: 6 %
Lymphs Abs: 0.9 10*3/uL (ref 0.7–4.0)
MCH: 28.7 pg (ref 26.0–34.0)
MCHC: 29.8 g/dL — ABNORMAL LOW (ref 30.0–36.0)
MCV: 96.3 fL (ref 80.0–100.0)
Monocytes Absolute: 1.2 10*3/uL — ABNORMAL HIGH (ref 0.1–1.0)
Monocytes Relative: 8 %
Neutro Abs: 12.5 10*3/uL — ABNORMAL HIGH (ref 1.7–7.7)
Neutrophils Relative %: 84 %
Platelets: 162 10*3/uL (ref 150–400)
RBC: 3 MIL/uL — ABNORMAL LOW (ref 3.87–5.11)
RDW: 19 % — ABNORMAL HIGH (ref 11.5–15.5)
WBC: 14.8 10*3/uL — ABNORMAL HIGH (ref 4.0–10.5)
nRBC: 0 % (ref 0.0–0.2)

## 2020-01-22 LAB — RENAL FUNCTION PANEL
Albumin: 1.3 g/dL — ABNORMAL LOW (ref 3.5–5.0)
Anion gap: 10 (ref 5–15)
BUN: 43 mg/dL — ABNORMAL HIGH (ref 8–23)
CO2: 27 mmol/L (ref 22–32)
Calcium: 8.4 mg/dL — ABNORMAL LOW (ref 8.9–10.3)
Chloride: 96 mmol/L — ABNORMAL LOW (ref 98–111)
Creatinine, Ser: 2.87 mg/dL — ABNORMAL HIGH (ref 0.44–1.00)
GFR calc Af Amer: 19 mL/min — ABNORMAL LOW (ref >60)
GFR calc non Af Amer: 17 mL/min — ABNORMAL LOW (ref >60)
Glucose, Bld: 140 mg/dL — ABNORMAL HIGH (ref 70–99)
Phosphorus: 4.6 mg/dL (ref 2.5–4.6)
Potassium: 4.2 mmol/L (ref 3.5–5.1)
Sodium: 133 mmol/L — ABNORMAL LOW (ref 135–145)

## 2020-01-22 NOTE — Progress Notes (Signed)
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  On 12/13/2019, PCCM was consulted for tachypnea and enlarging left-sided pleural effusion, thoracentesis showed hemothorax.  CT of the chest showed obstruction of her left mainstem bronchus and loculated left pleural effusion.  She was mated to ICU under PCCM service on 11/25/2019 for chest tube placement and bronchoscopy.  She underwent intrapleural TPA but required VATS eventually.  Her course was complicated by ICH, seizures, AKI, requiring CRRT.  Patient subsequently had tracheostomy placed on 12/22/2019, NG tube and tunneled vascular catheter placed on 01/13/2020.  Subsequently patient was started on intermittent hemodialysis.  Significant Hospital events 4/16 IR thrombectomy R MCA stroke 5/6 transfer to [facility] 5/29 PCCM consult left pleural effusion, thoracentesis 5/30 worsening tachypnea. Moved to ICU for intubation to facilitate bronchoscopy and chest tube placement 5/31 TPA/ pulmonzyme started for 3 days for persistent left effusion 6/01 Afebrile, low-dose Levophed, Precedex, Minimal output on chest tube- pulmozyme / tpa x 2,  6/02 pulmozyme and TPA completed 3 days 6/03 afib with RVR placed on amio gtt overnight , 1U PRBC for Hb 7.1  6/05 extubated successfully, Repeat TPA/Pulmonzyme given again  6/06 acute decompensation overnight, hemorrhagic shock >to OR for left VATS with 2L hemothorax drained. CT head with new ICH>neurosurgery consulted 6/09  More awake but agitated on PSV weaning  6/12: Seizures + on EEG 6/22 trach 6/29 g tube and tunneled vascular catheter placed 7/2: Care transferred to [facility]  Subjective   Patient seen and examined, getting off and on from mechanical ventilation.    Assessment/Plan:     1. Acute respiratory failure with hypoxia s/p tracheostomy-mucous plugging, suspected aspiration, left-sided loculated effusion complicated with hemothorax status post VATS decortication.  Patient has a long hospitalization and was intubated but subsequently underwent tracheostomy on 01/06/2020.  Patient is requiring mechanical ventilation intermittently.  Wean off as able. Chest x-ray done yesterday shows left-sided pleural effusion with mid and left lung base opacity. Also shows improved aeration in right lung fields. Will continue to monitor. 2. Acute kidney injury-oliguric ischemic ATN secondary to hemorrhagic shock.  Refractory to IV diuretics, started on CRRT from 12/26/2019 6 08/23/2019.  Subsequently started on hemodialysis from 01/05/2020.  Patient is status post right IJ tunneled dialysis catheter placement by IR on 01/13/2020.  Nephrology following and managing hemodialysis.  Patient is a poor candidate for long-term hemodialysis in this current state. 3. History of right MCA stroke-s/p thrombectomy with residual left-sided paralysis s/p right hemicraniectomy complicated by ICH after intrapleural TPA. 4. Dysphagia-patient without new ICH on 12/26/2019.  Manage medically as per neurosurgery recommendations.  G-tube was placed on 01/13/2020 and feeding has been started. 5. 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  EEG had shown status epilepticus.  Continue phenytoin via G-tube.  Continue Keppra.  As needed Ativan. 6. Chronic atrial fibrillation-patient has history of sick sinus syndrome s/p pacemaker placement.  Continue amiodarone, aspirin. 7. Protein calorie malnutrition-continue tube feeding per  nutrition. 8. Diabetes mellitus type 2 with hyperglycemia and hypoglycemia episodes-continue sliding scale insulin with NovoLog. 9. Hyponatremia-patient has mild hyponatremia, will continue  to monitor. 10. Anemia chronic disease-secondary to chronic illness hemoglobin has been stable.  Patient is received multiple PRBCs transfusions.  Will transfuse for hemoglobin 7.0. 11. Hypertension-blood pressure is controlled. 12. Overall poor prognosis.  Patient is very deconditioned and has poor prognosis.  Palliative care was consulted and has signed off.  Patient will need LTAC placement.    SpO2: 97 % O2 Flow Rate (L/min): 10 L/min FiO2 (%): 40 %   COVID-19 Labs  No results for input(s): DDIMER, FERRITIN, LDH, CRP in the last 72 hours.  Lab Results  Component Value Date   [place] NEGATIVE 10/31/2019     CBG: Recent Labs  Lab 01/21/20 2350 01/22/20 0318 01/22/20 0752 01/22/20 1141 01/22/20 1254  GLUCAP 171* 108* 104* 163* 191*    CBC: Recent Labs  Lab 01/17/20 0505 01/19/20 0700 01/20/20 0554 01/21/20 0445 01/22/20 0500  WBC 12.8* 12.4* 10.6* 12.4* 14.8*  NEUTROABS 10.1*  --  8.0* 9.6* 12.5*  HGB 8.9* 8.1* 8.1* 7.9* 8.6*  HCT 29.3* 26.3* 26.8* 26.2* 28.9*  MCV 98.0 97.0 97.1 96.0 96.3  PLT 175 131* 143* 137* 588    Basic Metabolic Panel: Recent Labs  Lab 01/16/20 0504 01/17/20 0505 01/18/20 0500 01/19/20 0319 01/20/20 0554 01/21/20 0445 01/22/20 0500  NA 131*   < > 131* 132* 130* 130* 133*  K 3.7   < > 4.6 4.4 3.4* 3.9 4.2  CL 94*   < > 96* 95* 93* 92* 96*  CO2 23   < > _0 GLUCOSE 181*   < > 133* 135* 134* 168* 140*  BUN 57*   < > 60* 81* 48* 72* 43*  CREATININE 3.82*   < > 4.17* 4.92* 3.26* 4.31* 2.87*  CALCIUM 8.0*   < > 7.8* 7.9* 7.9* 8.1* 8.4*  MG 2.5*  --   --   --  2.3  --   --   PHOS 4.9*   < > 5.1* 6.1* 3.5 4.6 4.6   < > = values in this interval not displayed.     Liver Function Tests: Recent Labs  Lab 01/18/20 0500  01/19/20 0319 01/20/20 0554 01/21/20 0445 01/22/20 0500  AST  --   --  189* 145*  --   ALT  --   --  70* 66*  --   ALKPHOS  --   --  860* 840*  --   BILITOT  --   --  0.6 0.7  --   PROT  --   --  8.4* 8.6*  --   ALBUMIN 1.2* 1.2* 1.3* 1.3* 1.3*        DVT prophylaxis: SCDs  Code Status: Full code  Family Communication: No family at bedside    Status is: Inpatient  Dispo: The patient is from: Home              Anticipated d/c is to: LTAC              Anticipated d/c date is: 01/23/2020              Patient currently not medically stable for discharge  Barrier to discharge-requiring hemodialysis, mechanical ventilation        Scheduled medications:  . amantadine  100 mg Per Tube BID  . amiodarone  200 mg Per Tube Daily  . aspirin  325 mg Per Tube Daily  . atorvastatin  40 mg Per Tube Daily  . chlorhexidine gluconate (MEDLINE KIT)  15  mL Mouth Rinse BID  . Chlorhexidine Gluconate Cloth  6 each Topical Q0600  . darbepoetin (ARANESP) injection - DIALYSIS  100 mcg Intravenous Q Fri-HD  . docusate  100 mg Per Tube BID  . feeding supplement (NEPRO CARB STEADY)  237 mL Per Tube TID PC & HS  . feeding supplement (PROSource TF)  45 mL Per Tube BID  . Gerhardt's butt cream   Topical TID  . insulin aspart  0-20 Units Subcutaneous Q4H  . levETIRAcetam  750 mg Per Tube Q24H  . mouth rinse  15 mL Mouth Rinse 10 times per day  . multivitamin  1 tablet Per Tube QHS  . pantoprazole sodium  40 mg Per Tube QHS  . phenytoin  100 mg Per Tube Q8H  . polyethylene glycol  17 g Per Tube TID  . senna-docusate  1 tablet Per Tube BID  . sodium chloride flush  10-40 mL Intracatheter Q12H    Consultants:  PCCM  Palliative care  Nephrology  Neurology  Neurosurgery  IR  Procedures: 12/13/2019 thoracentesis: 1.5 L bloody fluid Bronchoscopy on 12/13/2019 ETT: 12/09/2019-01/06/2020 Tracheostomy: 01/06/2020 onwards Left pigtail chest tube 12/07/2019-01/02/2020 [facility] HD cath from  12/27/2019-01/13/2020 G-tube and tunneled vascular catheter placement on 01/13/2020  Overnight EEG on 12/27/2019: Right frontocentral seizures x2   Antibiotics:   Anti-infectives (From admission, onward)   Start     Dose/Rate Route Frequency Ordered Stop   01/15/20 0745  ceFAZolin (ANCEF) IVPB 2g/100 mL premix  Status:  Discontinued        2 g 200 mL/hr over 30 Minutes Intravenous  Once 01/15/20 0739 01/18/20 0938   01/13/20 1645  ceFAZolin (ANCEF) IVPB 2g/100 mL premix        2 g 200 mL/hr over 30 Minutes Intravenous To Radiology 01/13/20 1630 01/13/20 1640   01/13/20 1630  ceFAZolin (ANCEF) IVPB 2g/100 mL premix  Status:  Discontinued        2 g 200 mL/hr over 30 Minutes Intravenous To ShortStay Surgical 01/13/20 1625 01/13/20 1906   01/13/20 0800  ceFAZolin (ANCEF) IVPB 2g/100 mL premix        2 g 200 mL/hr over 30 Minutes Intravenous To Radiology 01/12/20 1007 01/13/20 1836   01/12/20 1030  ceFAZolin (ANCEF) IVPB 2g/100 mL premix  Status:  Discontinued        2 g 200 mL/hr over 30 Minutes Intravenous To Radiology 01/12/20 0927 01/12/20 1007   12/27/19 1000  ceFEPIme (MAXIPIME) 2 g in sodium chloride 0.9 % 100 mL IVPB  Status:  Discontinued        2 g 200 mL/hr over 30 Minutes Intravenous Every 12 hours 12/26/19 1412 12/27/19 1017   12/26/19 1000  ceFEPIme (MAXIPIME) 1 g in sodium chloride 0.9 % 100 mL IVPB  Status:  Discontinued        1 g 200 mL/hr over 30 Minutes Intravenous Every 24 hours 12/25/19 1152 12/26/19 1412   01/01/2020 1244  ceFAZolin (ANCEF) IVPB 2g/100 mL premix  Status:  Discontinued        2 g 200 mL/hr over 30 Minutes Intravenous 30 min pre-op 01/11/2020 1244 12/30/2019 1823   12/20/2019 0600  ceFEPIme (MAXIPIME) 1 g in sodium chloride 0.9 % 100 mL IVPB  Status:  Discontinued        1 g 200 mL/hr over 30 Minutes Intravenous Every 8 hours 01/07/2020 0337 01/12/2020 0340   12/31/2019 0400  vancomycin (VANCOCIN) IVPB 1000 mg/200 mL premix  1,000 mg 200 mL/hr over 60  Minutes Intravenous  Once 12/31/2019 0337 12/18/2019 0653   01/06/2020 0400  ceFEPIme (MAXIPIME) 2 g in sodium chloride 0.9 % 100 mL IVPB  Status:  Discontinued        2 g 200 mL/hr over 30 Minutes Intravenous Every 24 hours 12/18/2019 0340 12/25/19 1152   12/20/19 2200  amoxicillin-clavulanate (AUGMENTIN) 500-125 MG per tablet 500 mg  Status:  Discontinued        1 tablet Oral 2 times daily 12/20/19 1412 12/18/2019 0337   12/20/19 1415  amoxicillin-clavulanate (AUGMENTIN) 875-125 MG per tablet 1 tablet  Status:  Discontinued        1 tablet Oral Every 12 hours 12/20/19 1407 12/20/19 1411   11/23/2019 1245  piperacillin-tazobactam (ZOSYN) IVPB 3.375 g  Status:  Discontinued        3.375 g 12.5 mL/hr over 240 Minutes Intravenous Every 8 hours 12/04/2019 1235 12/20/19 1407       Objective   Vitals:   01/22/20 1000 01/22/20 1100 01/22/20 1127 01/22/20 1136  BP: (!) 165/65 (!) 163/54    Pulse: 73 73 72   Resp: (!) 28 (!) 49 (!) 38   Temp:    98 F (36.7 C)  TempSrc:    Oral  SpO2: 97% 97% 97%   Weight:      Height:        Intake/Output Summary (Last 24 hours) at 01/22/2020 1359 Last data filed at 01/22/2020 1300 Gross per 24 hour  Intake 794 ml  Output 600 ml  Net 194 ml    07/06 1901 - 07/08 0700 In: 850  Out: 2800   Filed Weights   01/21/20 0658 01/21/20 1047 01/22/20 0319  Weight: 60.9 kg 60 kg 59.6 kg    Physical Examination:   General-appears in no acute distress  Heart-S1-S2, regular, no murmur auscultated  Lungs-clear to auscultation bilaterally, no wheezing or crackles auscultated  Abdomen-soft, nontender, no organomegaly  Extremities-no edema in the lower extremities  Neuro-alert, oriented x3, no focal deficit noted    Data Reviewed:   Recent Results (from the past 240 hour(s))  Culture, respiratory     Status: None   Collection Time: 01/14/20 12:46 PM   Specimen: Tracheal Aspirate; Respiratory  Result Value Ref Range Status   Specimen Description TRACHEAL  ASPIRATE  Final   Special Requests NONE  Final   Gram Stain   Final    NO WBC SEEN NO ORGANISMS SEEN Performed at [facility], [address], [place], [place] [address]    Culture FEW METHICILLIN RESISTANT STAPHYLOCOCCUS AUREUS  Final   Report Status 01/17/2020 FINAL  Final   Organism ID, Bacteria METHICILLIN RESISTANT STAPHYLOCOCCUS AUREUS  Final      Susceptibility   Methicillin resistant staphylococcus aureus - MIC*    CIPROFLOXACIN >=8 RESISTANT Resistant     ERYTHROMYCIN >=8 RESISTANT Resistant     GENTAMICIN <=0.5 SENSITIVE Sensitive     OXACILLIN >=4 RESISTANT Resistant     TETRACYCLINE <=1 SENSITIVE Sensitive     VANCOMYCIN 1 SENSITIVE Sensitive     TRIMETH/SULFA <=10 SENSITIVE Sensitive     CLINDAMYCIN <=0.25 SENSITIVE Sensitive     RIFAMPIN <=0.5 SENSITIVE Sensitive     Inducible Clindamycin NEGATIVE Sensitive     * FEW METHICILLIN RESISTANT STAPHYLOCOCCUS AUREUS  MRSA PCR Screening     Status: None   Collection Time: 01/18/20  8:56 AM   Specimen: Nasal Mucosa; Nasopharyngeal  Result Value Ref  Range Status   MRSA by PCR NEGATIVE NEGATIVE Final    Comment:        The GeneXpert MRSA Assay (FDA approved for NASAL specimens only), is one component of a comprehensive MRSA colonization surveillance program. It is not intended to diagnose MRSA infection nor to guide or monitor treatment for MRSA infections. Performed at [facility], [place] [address] [place], [place] [address]     No results for input(s): LIPASE, AMYLASE in the last 168 hours. No results for input(s): AMMONIA in the last 168 hours.  Cardiac Enzymes: No results for input(s): CKTOTAL, CKMB, CKMBINDEX, TROPONINI in the last 168 hours. BNP (last 3 results) Recent Labs    11/11/19 2226  BNP 93.9    Studies:  DG CHEST PORT 1 VIEW  Result Date: 01/21/2020 CLINICAL DATA:  Dyspnea.  Tracheostomy tube. EXAM: PORTABLE CHEST 1 VIEW COMPARISON:  01/18/2020 FINDINGS: Tracheostomy tube  projects over the tracheal air column. Right sided dialysis catheter noted with tip projecting over the lower SVC. Dual lead pacer noted. Left-sided PICC line tip: Lower SVC. Moderate left pleural effusion with blunting left costophrenic angle similar to prior. Hazy airspace opacity in the left mid lung and left lung base. Indistinct pulmonary vasculature. Improved aeration in the right lower lobe along the hemidiaphragm compared to the 01/18/2020 exam. Bony demineralization. IMPRESSION: 1. Stable moderate left pleural effusion with hazy airspace opacity in the left mid lung and left lung base. 2. Improved aeration in the right lower lobe along the hemidiaphragm. 3. Indistinct pulmonary vasculature may reflect pulmonary venous hypertension. Electronically Signed   By: Van Clines M.D.   On: 01/21/2020 11:08       [place]   [facility] If 7PM-7AM, please contact night-coverage at [email or web address], Office  [phone]   01/22/2020, 1:59 PM  LOS: 39 days

## 2020-01-22 NOTE — Progress Notes (Signed)
Physical Therapy Treatment Patient Details Name: Krystal Little MRN: [id number] DOB: 09-07-1955 Today's Date: 01/22/2020    History of Present Illness Pt is 64 y.o. female initially admitted for stroke. 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 Pt s/p R hemicraniectomy with bone flap placed in abdomen on 4/18.  PMH HTN, DM2, chronic a. Fib (coumadin) s/p pace maker.  She went to inpatient rehab and on May 29 readmitted to hospital in the setting of tachypnea and an enlarging left-sided pleural effusion. Thoracentesis was performed which showed a hemothorax.  A CT chest was then ordered showing obstruction of her left mainstem bronchus and a loculated left pleural effusion. She had bronchoscopy and L chest tube placed 5/30.  She was intubated 5/30-12/19/19. Pt reintubated 6/6 and underwent lt thoracotomy to drain hemothorax. Post op found to have craniotomy area fuller and CT showed  hemorrhage into the posterior R MCA distribution. Pt developed AKI and CRRT 6/11- 6/20. Intermittent HD initiated. Trach placed 6/22.     PT Comments    Unable to lift to chair today due to lift not charged so placed on charger for next time.  Patient did sit up with bed in egress mode and working on forward weight shift and sitting balance as well as L side awareness.  Still with increased RR throughout, but seemed better when sitting up than in supine.  Family in room prior to session working out her legs in the bed and pt seemed eager to participate.  Feel she will continue to benefit from skilled PT in the acute setting.  Recommend LTACH at d/c.  Goals updated this session.    Follow Up Recommendations  LTACH     Equipment Recommendations  Other (comment)    Recommendations for Other Services       Precautions / Restrictions Precautions Precautions: Fall Precaution Comments: R hemicraniectomy- bone flap in R abdomen, needs helmet for OOB, PEG, trach    Mobility  Bed Mobility Overal  bed mobility: Needs Assistance Bed Mobility: Rolling Rolling: Mod assist;+2 for physical assistance   Supine to sit: Total assist     General bed mobility comments: attempting to use maxi sky to lift to recliner and rolled in bed to place lift pad, but noted lift not on charger and not charged so used bed egress to sit up and remove lift pad.  Working on forward weight shift and balance without UE support leaning away from back of bed; then returned to supine  Transfers                    Ambulation/Gait                 Stairs             Wheelchair Mobility    Modified Rankin (Stroke Patients Only) Modified Rankin (Stroke Patients Only) Pre-Morbid Rankin Score: No symptoms Modified Rankin: Severe disability     Balance Overall balance assessment: Needs assistance Sitting-balance support: No upper extremity supported;Feet supported Sitting balance-Leahy Scale: Poor Sitting balance - Comments: leaning forward away from back of bed in chair position leaning to R without UE support Postural control: Right lateral lean                                  Cognition Arousal/Alertness: Awake/alert Behavior During Therapy: Flat affect Overall Cognitive Status: Difficult to assess  Area of Impairment: Attention;Following commands                   Current Attention Level: Sustained   Following Commands: Follows one step commands consistently;Follows one step commands with increased time              Exercises      General Comments General comments (skin integrity, edema, etc.): Trach collar @ 40% with RR 40's-50's in supine 20's-30's seated in chair position, SpO2 94%, RN aware      Pertinent Vitals/Pain Pain Assessment: Faces Faces Pain Scale: No hurt    Home Living                      Prior Function            PT Goals (current goals can now be found in the care plan section) Acute Rehab PT Goals Patient  Stated Goal: Pt trached PT Goal Formulation: Patient unable to participate in goal setting Time For Goal Achievement: 02/05/20 Potential to Achieve Goals: Fair Progress towards PT goals: Progressing toward goals (slowly with goals extended today)    Frequency    Min 2X/week      PT Plan Current plan remains appropriate    Co-evaluation              AM-PAC PT "6 Clicks" Mobility   Outcome Measure  Help needed turning from your back to your side while in a flat bed without using bedrails?: Total Help needed moving from lying on your back to sitting on the side of a flat bed without using bedrails?: Total Help needed moving to and from a bed to a chair (including a wheelchair)?: Total Help needed standing up from a chair using your arms (e.g., wheelchair or bedside chair)?: Total Help needed to walk in hospital room?: Total Help needed climbing 3-5 steps with a railing? : Total 6 Click Score: 6    End of Session Equipment Utilized During Treatment: Oxygen;Other (comment) (trach collar) Activity Tolerance: Patient tolerated treatment well Patient left: in bed;with call bell/phone within reach   PT Visit Diagnosis: Other abnormalities of gait and mobility (R26.89);Hemiplegia and hemiparesis Hemiplegia - Right/Left: Left Hemiplegia - dominant/non-dominant: Non-dominant Hemiplegia - caused by: Cerebral infarction;Nontraumatic intracerebral hemorrhage     Time: 1142-1204 PT Time Calculation (min) (ACUTE ONLY): 22 min  Charges:  $Therapeutic Activity: 8-22 mins                     Magda Kiel, PT Acute Rehabilitation Services Pager:[phone] Office:[phone] 01/22/2020    Reginia Naas 01/22/2020, 4:40 PM

## 2020-01-22 NOTE — Progress Notes (Signed)
RT note- Placed back to current vent setting charted. Patient with RR 50+, now RR 32 on the vent. Continue to monitor.

## 2020-01-22 NOTE — Progress Notes (Signed)
RT called to room due to patient O2 saturations were in the mid 70's.  Patient had vomited and RN was at bedside.  Patient was tachypnic and labored.  RT placed patient back on the ventilator on CPAP/PS ventilation. Patient O2 saturations began to approve on 50% 12 PS PEEP 5.

## 2020-01-22 NOTE — Progress Notes (Signed)
SLP Cancellation Note  Patient Details Name: SIEARA BREMER MRN: [id number] DOB: 1956-03-29   Cancelled treatment:       Reason Eval/Treat Not Completed: Patient at procedure or test/unavailable (Pt working with PT at this time. SLP will f/u later as able.)  Deaundra Kutzer I. [name], [place], [place] Office number [phone] Pager [place] 01/22/2020, 11:49 AM

## 2020-01-22 NOTE — Progress Notes (Signed)
Patient ID: Krystal Little, female   DOB: Aug 08, 1955, 64 y.o.   MRN: [id number] S: Tolerated HD well yesterday.  No events overnight O:BP (!) 143/48   Pulse 66   Temp 99.5 F (37.5 C) (Oral)   Resp 14   Ht '5\' 4"'  (1.626 m)   Wt 59.6 kg   SpO2 100%   BMI 22.55 kg/m   Intake/Output Summary (Last 24 hours) at 01/22/2020 0824 Last data filed at 01/22/2020 0317 Gross per 24 hour  Intake 220 ml  Output 2600 ml  Net -2380 ml   Intake/Output: I/O last 3 completed shifts: In: 850 [NG/GT:850] Out: 2800 [Other:2000; Stool:800]  Intake/Output this shift:  No intake/output data recorded. Weight change: 0.4 kg Gen: NAD CVS: no rub, RRR Resp: cta Abd: benign Ext: no edema  Recent Labs  Lab 01/16/20 0504 01/17/20 0505 01/18/20 0500 01/19/20 0319 01/20/20 0554 01/21/20 0445 01/22/20 0500  NA 131* 132* 131* 132* 130* 130* 133*  K 3.7 3.8 4.6 4.4 3.4* 3.9 4.2  CL 94* 97* 96* 95* 93* 92* 96*  CO2 '23 26 24 25 26 26 27  ' GLUCOSE 181* 179* 133* 135* 134* 168* 140*  BUN 57* 37* 60* 81* 48* 72* 43*  CREATININE 3.82* 2.79* 4.17* 4.92* 3.26* 4.31* 2.87*  ALBUMIN 1.4* 1.3* 1.2* 1.2* 1.3* 1.3* 1.3*  CALCIUM 8.0* 8.0* 7.8* 7.9* 7.9* 8.1* 8.4*  PHOS 4.9* 3.2 5.1* 6.1* 3.5 4.6 4.6  AST  --   --   --   --  189* 145*  --   ALT  --   --   --   --  70* 66*  --    Liver Function Tests: Recent Labs  Lab 01/20/20 0554 01/21/20 0445 01/22/20 0500  AST 189* 145*  --   ALT 70* 66*  --   ALKPHOS 860* 840*  --   BILITOT 0.6 0.7  --   PROT 8.4* 8.6*  --   ALBUMIN 1.3* 1.3* 1.3*   No results for input(s): LIPASE, AMYLASE in the last 168 hours. No results for input(s): AMMONIA in the last 168 hours. CBC: Recent Labs  Lab 01/17/20 0505 01/17/20 0505 01/19/20 0700 01/19/20 0700 01/20/20 0554 01/21/20 0445 01/22/20 0500  WBC 12.8*   < > 12.4*   < > 10.6* 12.4* 14.8*  NEUTROABS 10.1*   < >  --   --  8.0* 9.6* 12.5*  HGB 8.9*   < > 8.1*   < > 8.1* 7.9* 8.6*  HCT 29.3*   < > 26.3*   < > 26.8*  26.2* 28.9*  MCV 98.0  --  97.0  --  97.1 96.0 96.3  PLT 175   < > 131*   < > 143* 137* 162   < > = values in this interval not displayed.   Cardiac Enzymes: No results for input(s): CKTOTAL, CKMB, CKMBINDEX, TROPONINI in the last 168 hours. CBG: Recent Labs  Lab 01/21/20 1515 01/21/20 2104 01/21/20 2350 01/22/20 0318 01/22/20 0752  GLUCAP 102* 151* 171* 108* 104*    Iron Studies: No results for input(s): IRON, TIBC, TRANSFERRIN, FERRITIN in the last 72 hours. Studies/Results: DG CHEST PORT 1 VIEW  Result Date: 01/21/2020 CLINICAL DATA:  Dyspnea.  Tracheostomy tube. EXAM: PORTABLE CHEST 1 VIEW COMPARISON:  01/18/2020 FINDINGS: Tracheostomy tube projects over the tracheal air column. Right sided dialysis catheter noted with tip projecting over the lower SVC. Dual lead pacer noted. Left-sided PICC line tip: Lower SVC. Moderate left pleural effusion  with blunting left costophrenic angle similar to prior. Hazy airspace opacity in the left mid lung and left lung base. Indistinct pulmonary vasculature. Improved aeration in the right lower lobe along the hemidiaphragm compared to the 01/18/2020 exam. Bony demineralization. IMPRESSION: 1. Stable moderate left pleural effusion with hazy airspace opacity in the left mid lung and left lung base. 2. Improved aeration in the right lower lobe along the hemidiaphragm. 3. Indistinct pulmonary vasculature may reflect pulmonary venous hypertension. Electronically Signed   By: Van Clines M.D.   On: 01/21/2020 11:08   . amantadine  100 mg Per Tube BID  . amiodarone  200 mg Per Tube Daily  . aspirin  325 mg Per Tube Daily  . atorvastatin  40 mg Per Tube Daily  . chlorhexidine gluconate (MEDLINE KIT)  15 mL Mouth Rinse BID  . Chlorhexidine Gluconate Cloth  6 each Topical Q0600  . darbepoetin (ARANESP) injection - DIALYSIS  100 mcg Intravenous Q Fri-HD  . docusate  100 mg Per Tube BID  . feeding supplement (NEPRO CARB STEADY)  237 mL Per Tube TID PC  & HS  . feeding supplement (PROSource TF)  45 mL Per Tube BID  . Gerhardt's butt cream   Topical TID  . insulin aspart  0-20 Units Subcutaneous Q4H  . levETIRAcetam  750 mg Per Tube Q24H  . mouth rinse  15 mL Mouth Rinse 10 times per day  . multivitamin  1 tablet Per Tube QHS  . pantoprazole sodium  40 mg Per Tube QHS  . phenytoin  100 mg Per Tube Q8H  . polyethylene glycol  17 g Per Tube TID  . senna-docusate  1 tablet Per Tube BID  . sodium chloride flush  10-40 mL Intracatheter Q12H    BMET    Component Value Date/Time   NA 133 (L) 01/22/2020 0500   NA 140 08/18/2019 1120   K 4.2 01/22/2020 0500   CL 96 (L) 01/22/2020 0500   CO2 27 01/22/2020 0500   GLUCOSE 140 (H) 01/22/2020 0500   BUN 43 (H) 01/22/2020 0500   BUN 12 08/18/2019 1120   CREATININE 2.87 (H) 01/22/2020 0500   CREATININE 0.73 05/18/2016 0919   CALCIUM 8.4 (L) 01/22/2020 0500   GFRNONAA 17 (L) 01/22/2020 0500   GFRNONAA 89 05/18/2016 0919   GFRAA 19 (L) 01/22/2020 0500   GFRAA >89 05/18/2016 0919   CBC    Component Value Date/Time   WBC 14.8 (H) 01/22/2020 0500   RBC 3.00 (L) 01/22/2020 0500   HGB 8.6 (L) 01/22/2020 0500   HGB 15.2 08/18/2019 1120   HCT 28.9 (L) 01/22/2020 0500   HCT 45.9 08/18/2019 1120   PLT 162 01/22/2020 0500   PLT 168 08/18/2019 1120   MCV 96.3 01/22/2020 0500   MCV 94 08/18/2019 1120   MCH 28.7 01/22/2020 0500   MCHC 29.8 (L) 01/22/2020 0500   RDW 19.0 (H) 01/22/2020 0500   RDW 12.2 08/18/2019 1120   LYMPHSABS 0.9 01/22/2020 0500   MONOABS 1.2 (H) 01/22/2020 0500   EOSABS 0.1 01/22/2020 0500   BASOSABS 0.1 01/22/2020 0500   Assessment/Plan:  1. AKI- due to ischemic ATN in setting of hemorrhagic shock requiring pressor support and initiated on CRRT 6/11-6/20/21 and transitioned to IHD on 6/21 and has remained oliguric and dialysis dependent.  1. Will continue with HD on MWF schedule for now. 2. Has been tolerating HD 3. No signs of renal recovery at this time. 2. Left  sided hemothorax- s/p  VATS on 6/6 and s/p removal of one chest tube 6/21 3. RMCA stroke- s/p thrombectomy 4/16 c/b worsening cerebral edema with mass effect s/p decompressive right hemicraniectomy and placement of bone flap on 11/02/19, new ICH 12/20/2019. 4. 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 Chronic atrial fibrillation- on amio and asa 6. HTN- stable 7. Seizure disorder- per neuro 8. Nutrition- s/p PEG by IR on 6/29 9. ABLA/Anemia of critical illness- s/p multiple transfusions 10. Disposition- will need LTAC but poor overall prognosis. Continue with ongoing discussions with family regarding poor prognosis for meaningful recovery.  Donetta Potts, MD [employer] [phone]

## 2020-01-22 NOTE — Progress Notes (Signed)
  Speech Language Pathology Treatment: Nada Boozer Speaking valve  Patient Details Name: Krystal Little MRN: [id number] DOB: Oct 03, 1955 Today's Date: 01/22/2020 Time: 1657-9038 SLP Time Calculation (min) (ACUTE ONLY): 24 min  Assessment / Plan / Recommendation Clinical Impression  Pt was seen for treatment. Pt's respiratory rate was elevated at the onset of the session at 37 with intermittent increases to the mid-high 40s. Pt's case was discussed with RN and she indicated that the pt's RR has been in the 40s and 29s today and more recently. RN indicated that pt's cuff may be deflated for treatment and her vitals were initially RR 25-28, SpO2 97-99% and HR 68-73 following cuff deflation. However, after 10 minutes, pt began demonstrating increased work of breathing while attempting to expectorate secretions from trach and cuff was re-inflated by SLP. RN was consulted for suctioning and suctioned pt. Pt began desating to the 80s and 70s during suctioning; RT was contacted and came to assess pt at bedside. PSMV application was not completed during this session due to her reduced tolerance of cuff deflation. Pt has been on the vent more this week and Kim, RT reported that the pt has not been tolerating cuff deflation as well as last week. Pt's husband was at bedside and was updated regarding the pt's performance during the session, recommendations, and plan of care. SLP will continue to follow pt but frequency will be reduced to once per week pending her ability to tolerate more.    HPI HPI: Pt is a 64 y.o. female presented to [facility] ED as a code stroke for R gaze deviation and L-sided weakness. CT revealed a large R MCA infarct. Pt s/p R hemicraniectomy with bone flap placed in abdomen on 4/18. ETT 4/18-4/25. PMH HTN, DM2, chronic a. Fib (coumadin) s/p pace maker. Pt admitted to [facility] 11/20/19.  Pulmonary and critical care medicine was consulted on May 29 in the setting of tachypnea and an enlarging left-sided pleural  effusion. Thoracentesis was performed which showed a hemothorax.  A CT chest was then ordered showing obstruction of her left mainstem bronchus and a loculated left pleural effusion.  On May 30 she was moved to the ICU for chest tube placement and bronchoscopy. Pt was re-intubated from 12/09/2019-12/19/19. Most recent MBS on 12/03/19 recommended Dysphagia 2 solids and thin liquids. Right frontocentral seizures seizures noted on EEG 6/12. Trach 6/22. G-tube and tunnel cath on 6/29.       SLP Plan  Continue with current plan of care       Recommendations         Patient may use Passy-Muir Speech Valve: with SLP only PMSV Supervision: Full         Follow up Recommendations: LTACH SLP Visit Diagnosis: Aphonia (R49.1) Plan: Continue with current plan of care       Jearlean Demauro I. [name], [place], [place] Office number [phone] Pager [place] 01/22/2020, 3:06 PM

## 2020-01-23 ENCOUNTER — Inpatient Hospital Stay (HOSPITAL_COMMUNITY): Payer: Medicare Other

## 2020-01-23 DIAGNOSIS — K567 Ileus, unspecified: Secondary | ICD-10-CM

## 2020-01-23 LAB — CBC WITH DIFFERENTIAL/PLATELET
Abs Immature Granulocytes: 0.08 10*3/uL — ABNORMAL HIGH (ref 0.00–0.07)
Basophils Absolute: 0.1 10*3/uL (ref 0.0–0.1)
Basophils Relative: 0 %
Eosinophils Absolute: 0.3 10*3/uL (ref 0.0–0.5)
Eosinophils Relative: 2 %
HCT: 26 % — ABNORMAL LOW (ref 36.0–46.0)
Hemoglobin: 8 g/dL — ABNORMAL LOW (ref 12.0–15.0)
Immature Granulocytes: 1 %
Lymphocytes Relative: 11 %
Lymphs Abs: 1.6 10*3/uL (ref 0.7–4.0)
MCH: 28.9 pg (ref 26.0–34.0)
MCHC: 30.8 g/dL (ref 30.0–36.0)
MCV: 93.9 fL (ref 80.0–100.0)
Monocytes Absolute: 1.2 10*3/uL — ABNORMAL HIGH (ref 0.1–1.0)
Monocytes Relative: 9 %
Neutro Abs: 10.4 10*3/uL — ABNORMAL HIGH (ref 1.7–7.7)
Neutrophils Relative %: 77 %
Platelets: 175 10*3/uL (ref 150–400)
RBC: 2.77 MIL/uL — ABNORMAL LOW (ref 3.87–5.11)
RDW: 18.5 % — ABNORMAL HIGH (ref 11.5–15.5)
WBC: 13.6 10*3/uL — ABNORMAL HIGH (ref 4.0–10.5)
nRBC: 0 % (ref 0.0–0.2)

## 2020-01-23 LAB — RENAL FUNCTION PANEL
Albumin: 1.2 g/dL — ABNORMAL LOW (ref 3.5–5.0)
Anion gap: 12 (ref 5–15)
BUN: 75 mg/dL — ABNORMAL HIGH (ref 8–23)
CO2: 25 mmol/L (ref 22–32)
Calcium: 8.3 mg/dL — ABNORMAL LOW (ref 8.9–10.3)
Chloride: 95 mmol/L — ABNORMAL LOW (ref 98–111)
Creatinine, Ser: 4.13 mg/dL — ABNORMAL HIGH (ref 0.44–1.00)
GFR calc Af Amer: 12 mL/min — ABNORMAL LOW (ref >60)
GFR calc non Af Amer: 11 mL/min — ABNORMAL LOW (ref >60)
Glucose, Bld: 98 mg/dL (ref 70–99)
Phosphorus: 5 mg/dL — ABNORMAL HIGH (ref 2.5–4.6)
Potassium: 4.3 mmol/L (ref 3.5–5.1)
Sodium: 132 mmol/L — ABNORMAL LOW (ref 135–145)

## 2020-01-23 LAB — GLUCOSE, CAPILLARY
Glucose-Capillary: 107 mg/dL — ABNORMAL HIGH (ref 70–99)
Glucose-Capillary: 138 mg/dL — ABNORMAL HIGH (ref 70–99)
Glucose-Capillary: 145 mg/dL — ABNORMAL HIGH (ref 70–99)
Glucose-Capillary: 155 mg/dL — ABNORMAL HIGH (ref 70–99)
Glucose-Capillary: 78 mg/dL (ref 70–99)
Glucose-Capillary: 86 mg/dL (ref 70–99)
Glucose-Capillary: 94 mg/dL (ref 70–99)

## 2020-01-23 MED ORDER — HEPARIN SODIUM (PORCINE) 1000 UNIT/ML IJ SOLN
INTRAMUSCULAR | Status: AC
  Administered 2020-01-23: 1000 [IU] via INTRAVENOUS_CENTRAL
  Filled 2020-01-23: qty 4

## 2020-01-23 MED ORDER — DARBEPOETIN ALFA 100 MCG/0.5ML IJ SOSY
PREFILLED_SYRINGE | INTRAMUSCULAR | Status: AC
  Administered 2020-01-23: 100 ug via INTRAVENOUS
  Filled 2020-01-23: qty 0.5

## 2020-01-23 NOTE — Progress Notes (Signed)
Upon giving patient morning medication they immediatly began to vomit producing a large amount of emesis.  Zofran given.  MD notified. KUB ordered   Kathleene Hazel RN

## 2020-01-23 NOTE — Progress Notes (Signed)
Occupational Therapy Treatment Patient Details Name: Krystal Little MRN: [id number] DOB: [birth year] Today's Date: 01/23/2020    History of present illness Pt is 64 y.o. female initially admitted for stroke. 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 Pt s/p R hemicraniectomy with bone flap placed in abdomen on 4/18.  PMH HTN, DM2, chronic a. Fib (coumadin) s/p pace maker.  She went to inpatient rehab and on May 29 readmitted to hospital in the setting of tachypnea and an enlarging left-sided pleural effusion. Thoracentesis was performed which showed a hemothorax.  A CT chest was then ordered showing obstruction of her left mainstem bronchus and a loculated left pleural effusion. She had bronchoscopy and L chest tube placed 5/30.  She was intubated 5/30-12/19/19. Pt reintubated 6/6 and underwent lt thoracotomy to drain hemothorax. Post op found to have craniotomy area fuller and CT showed  hemorrhage into the posterior R MCA distribution. Pt developed AKI and CRRT 6/11- 6/20. Intermittent HD initiated. Trach placed 6/22.    OT comments  This 64 yo female admitted with above presents to acute OT attending to left in supine when her name is called or when asked to look left. She is following 75% of one step commands in supine. She is showing movement on left side (LE more than UE). She will continue to benefit from acute OT with follow up at [facility].   Follow Up Recommendations  LTACH , 24 hour S/prn A   Equipment Recommendations  Other (comment) (TBD next venue)       Precautions / Restrictions Precautions Precaution Comments: R hemicraniectomy- bone flap in R abdomen, needs helmet for OOB, PEG, trach Restrictions Weight Bearing Restrictions: No              ADL either performed or assessed with clinical judgement   ADL Overall ADL's : Needs assistance/impaired     Grooming: Wash/dry hands Grooming Details (indicate cue type and reason): Placed washcloth in her  right hand, she brought it up towards her face but only could get it to her chin.I assisted her to raise her arm more at shoulder and then she was able to wash her face either by moving her hand around or her head around (as I held the weight of her arm)                                     Vision   Additional Comments: Overall pt tends to keep her eyes and head midline or to right (but when her name is called or you ask her to look left she will (eyes and head turns).          Cognition Arousal/Alertness: Lethargic (not sure why (perhaps due to afternoon tiredness)) Behavior During Therapy: Flat affect Overall Cognitive Status: Difficult to assess Area of Impairment: Attention                   Current Attention Level: Focused   Following Commands: Follows one step commands with increased time       General Comments: Pt was able to mouth to me flashlight and sock when the items were presented and asked what they were (increased time)        Exercises Other Exercises Other Exercises: In supine worked with her on moving Bil UEs and Bil LEs. Limited AROM at right shoulder. PROM of LUE WNL, noted  a trace of shoulder movement (I held her arm in flexion, asked her to look at her left hand and then try to lay it in her lap)--only got this trace movment once. As for LEs she is stiff in both knees and hips. She was able to A with bending up her RLE and straghtening back down and add/abduction of RLE. For LLE when bent at knee for her she can extend it back down to bed, trace adduction and internal rotation as well as toes wiggling           Pertinent Vitals/ Pain       Pain Assessment: Faces Faces Pain Scale: No hurt         Frequency  Min 2X/week        Progress Toward Goals  OT Goals(current goals can now be found in the care plan section)  Progress towards OT goals: Progressing toward goals     Plan Discharge plan remains appropriate        AM-PAC OT "6 Clicks" Daily Activity     Outcome Measure   Help from another person eating meals?: Total Help from another person taking care of personal grooming?: A Lot Help from another person toileting, which includes using toliet, bedpan, or urinal?: Total Help from another person bathing (including washing, rinsing, drying)?: Total Help from another person to put on and taking off regular upper body clothing?: Total Help from another person to put on and taking off regular lower body clothing?: Total 6 Click Score: 7    End of Session Equipment Utilized During Treatment: Oxygen (trach collar 5 liters 28%)  OT Visit Diagnosis: Muscle weakness (generalized) (M62.81);Other abnormalities of gait and mobility (R26.89);Other symptoms and signs involving cognitive function;Hemiplegia and hemiparesis;Low vision, both eyes (H54.2) Hemiplegia - Right/Left: Left Hemiplegia - dominant/non-dominant: Non-Dominant Hemiplegia - caused by: Cerebral infarction Pain - Right/Left: Right   Activity Tolerance Patient limited by lethargy (she would be wide awake and interacting then drift off to sleep)   Patient Left in bed;with call bell/phone within reach           Time: 4034-7425 OT Time Calculation (min): 20 min  Charges: OT General Charges $OT Visit: 1 Visit OT Treatments $Therapeutic Activity: 8-22 mins  Golden Circle, OTR/L Acute [employer] Pager [phone] Office [phone]      Almon Register 01/23/2020, 1:47 PM

## 2020-01-23 NOTE — Procedures (Signed)
Tracheostomy Change Note  Patient Details:   Name: Krystal Little DOB: April 29, 1956 MRN: [id number]    Airway Documentation:     Evaluation  O2 sats: transiently fell during during procedure Complications: Complications of small amount of bleeding Patient did tolerate procedure well. Bilateral Breath Sounds: Diminished, Rhonchi  + end tidal co2    Revonda Standard 01/23/2020, 4:47 PM

## 2020-01-23 NOTE — Progress Notes (Signed)
NAME:  Krystal Little, MRN:  [id number], DOB:  Jun 15, 1956, LOS: 49 ADMISSION DATE:  11/19/2019, CONSULTATION DATE:  5/29 REFERRING MD:  Letta Pate, CHIEF COMPLAINT:  Chest congestion   Brief History   64 y/o F with an extensive cardiac history was admitted to [facility] in April in the setting of a right MCA stroke, had a right interventional radiology guided thrombectomy, later had significant brain edema and underwent craniectomy, ultimately transferred to inpatient rehab.  Pulmonary and critical care medicine was consulted on May 29 in the setting of tachypnea and an enlarging left-sided pleural effusion. Thoracentesis was performed which showed a hemothorax.  A CT chest was then ordered showing obstruction of her left mainstem bronchus and a loculated left pleural effusion.  On May 30 she was moved to the ICU for chest tube placement and bronchoscopy.  Underwent intrapleural TPA but required VATS eventually, course further complicated by ICH, seizures and AKI requiring CRRT.   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 Move to ICU for intubation to facilitate bronchoscopy and chest tube placement 5/31 TPA/ pulmonzyme started for 3 days for persistent left effusion 6/01 Afebrile, low-dose Levophed, Precedex, Minimal output on chest tube- pulmozyme / tpa x 2,  6/02 Weaned x 7 hours yesterday PSV 15/5, pulmozyme and TPA completed 3 days 6/03 afib with RVR placed on amio gtt overnight , 1U PRBC for Hb 7.1  6/05 extubated successfully, Repeat TPA/Pulmonzyme given again  6/06 acute decompensation overnight,  hemorrhagic shock > to OR for left VATS with 2L hemothorax.  CT head with new ICH 6/09 More awake but agitated on PSV weaning  6/12 Seizures + on EEG  6/22 trach 6/27 More awake 6/29 g tube and tunneled vascular catheter placed. Hypoglycemic with TF off 7/05 Stable on vent 7/09 vomiting after meds, KUB pending. Much more alert.    Consults:  PCCM  Procedures:  ETT 5/30 >> 6/22  Trach 6/22 >>  L pigtail chest tube 5/30 >> 6/6 LIJ HD cath 6/12 >> 6/29 PEG 6/29 >>  Tunneled HD 6/29 >>      Significant Diagnostic Tests:  Thoracentesis left chest 5/29 >> 8K WBC, 78% WBC, protein 4.0, LDH 182. 1.5L bloody fluid CT chest abdomen pelvis 6/6 >> large left hemithorax, patchy right lung opacities with small right effusion.  No acute abdominal process CT head 6/6 >> evolution of right MCA infarct with hemorrhagic transformation with large intraparenchymal hematoma, 2 additional small hemorrhages in the inferior cerebellum and left temporal lobe.  Trace subarachnoid blood. CT Head 6/10 >> stable hemorrhagic R MCA.  Right hemi-craniectomy with no midline shift or mass effect.  Mildly regressed small cerebellar hemorrhages.  Stable small volume intraventricular hemorrhage, no ventriculomegaly.  Trace subarachnoid hemorrhage largely resolved.  EEG 6/12 >> right frontocentral seizures X2  Bronchoscopy 5/30 >>  large, thick, grey mucus plug completely occluding the left mainstem bronchus.   Micro Data:  RVP 4/16 neg  MRSA PCR  4/17 neg   ======== Left pleural fluid culture 5/29 >> negative Left pleural fluid cytology 5/29 >> No malignant cells identified. Blood and acute inflammation.  BAL 5/30 >> rare candida albicans L  Pleural Fluid 6/6 >> negative BCx2 6/6 >> negative  Tracheal aspirate 6/30 >> few MRSA  MRSA PCR 7/4 >> negative   Antimicrobials:  Cefepime 4/24 >> 4/25 Zosyn 4/28 >> 5/4 Zosyn 5/30 >> 6/5 Cefepime 6/6 >> 6/12  Interim history/subjective:  Afebrile HD in progress RN  reports vomiting after medications this am. No change in vitals/ O2 needs   Objective   Blood pressure (!) 116/49, pulse 81, temperature 98.5 F (36.9 C), temperature source Oral, resp. rate 20, height _0  (1.626 m), weight 58.8 kg, SpO2 99 %.    Vent Mode: PRVC FiO2 (%):  [40 %] 40 % Set Rate:  [20 bmp] 20 bmp Vt Set:  [430 mL] 430 mL PEEP:  [5 cmH20] 5 cmH20 Pressure Support:  [12 cmH20] 12 cmH20 Plateau Pressure:  [17 cmH20-22 cmH20] 17 cmH20   Intake/Output Summary (Last 24 hours) at 01/23/2020 1003 Last data filed at 01/23/2020 0600 Gross per 24 hour  Intake 190 ml  Output 300 ml  Net -110 ml   Filed Weights   01/22/20 0319 01/23/20 0342 01/23/20 0646  Weight: 59.6 kg 58.2 kg 58.8 kg    Examination:  General: chronically ill appearing female lying in bed in NAD   HEENT: MM pink/moist, trach midline C/D/I Neuro: opens eyes to voice, mouths words "good morning" in return to greeting, reaches up to provider with right hand CV: s1s2 rrr, no m/r/g PULM: non-labored on vent, lungs bilaterally clear, scant bloody secretions in tubing (emesis was bright pink from meds), no evidence of medication in suction tubing  GI: soft, bsx4 active, PEG in place Extremities: warm/dry, no edema  Skin: no rashes or lesions  Assessment & Plan:   Acute Respiratory Failure with Hypoxemia s/p Tracheostomy  Mucus Plugging, Suspected Aspiration   Left Loculated Effusion complicated by Hemothorax s/p VATS Decortication MRSA in trach asp, but no sign of infection at this time, patient was MRSA PCR negative in April.  -monitor off abx -continue PRVC 8cc/kg as needed for rest mode -goal ATC as tolerated   Vomiting  -assess KUB, r/o ileus  -PRN zofran   Macrocytic Anemia B12 2230, folate 10.4 -follow CBC  AKI secondary to Shock No evidence of obstruction on renal US. S/P tunneled HD cath 6/29 -continue HD per Nephrology -Trend BMP / urinary output -Replace electrolytes as indicated  S/P  R MCA Stroke  Residual left sided paralysis - s/p right hemicraniotomy, complicated by ICH after intrapleural tPA.   -follow serial neuro exams -no surgical interventions  -will eventually need flap replaced   Status Epilepticus Suspect in setting of recent CVA -continue keppra, dilantin per Neuro  -PRN ativan for seizure   DM2 with Hyperglycemia Has had intermittent hypoglycemia with interruption of TF -per primary   Chronic Atrial Fibrillation s/p Pacemaker -per primary   Moderate Protein Calorie Malnutrition  -continue TF per primary   Goals of Care -full code  -will need placement > LTAC vs SNF.  Will depend on if she can be liberated from vent.  May be complicated placement process due to HD & trach needs currently.   Best practice:  Diet: TF Pain/Anxiety/Delirium protocol (if indicated): PRN fentanyl  VAP protocol (if indicated): yes DVT prophylaxis: Heparin SQ GI prophylaxis: PPI Glucose control: per primary  Mobility: BR Code Status: full code Family Communication: per primary team  Disposition: LTAC vs SNF     Noe Gens, MSN, NP-C [facility] 01/23/2020, 10:19 AM   Please see  [email or web address] for pager details.

## 2020-01-23 NOTE — Procedures (Signed)
I was present at this dialysis session. I have reviewed the session itself and made appropriate changes.   Vital signs in last 24 hours:  Temp:  [97.9 F (36.6 C)-99.2 F (37.3 C)] 98.5 F (36.9 C) (07/09 0826) Pulse Rate:  [61-75] 70 (07/09 0845) Resp:  [19-53] 29 (07/09 0845) BP: (113-185)/(49-68) 116/58 (07/09 0845) SpO2:  [96 %-100 %] 100 % (07/09 0845) FiO2 (%):  [40 %] 40 % (07/09 0808) Weight:  [58.2 kg-58.8 kg] 58.8 kg (07/09 0646) Weight change: -1.8 kg Filed Weights   01/22/20 0319 01/23/20 0342 01/23/20 0646  Weight: 59.6 kg 58.2 kg 58.8 kg    Recent Labs  Lab 01/23/20 0507  NA 132*  K 4.3  CL 95*  CO2 25  GLUCOSE 98  BUN 75*  CREATININE 4.13*  CALCIUM 8.3*  PHOS 5.0*    Recent Labs  Lab 01/21/20 0445 01/22/20 0500 01/23/20 0507  WBC 12.4* 14.8* 13.6*  NEUTROABS 9.6* 12.5* 10.4*  HGB 7.9* 8.6* 8.0*  HCT 26.2* 28.9* 26.0*  MCV 96.0 96.3 93.9  PLT 137* 162 175    Scheduled Meds: . amantadine  100 mg Per Tube BID  . amiodarone  200 mg Per Tube Daily  . aspirin  325 mg Per Tube Daily  . atorvastatin  40 mg Per Tube Daily  . chlorhexidine gluconate (MEDLINE KIT)  15 mL Mouth Rinse BID  . Chlorhexidine Gluconate Cloth  6 each Topical Q0600  . Darbepoetin Alfa      . darbepoetin (ARANESP) injection - DIALYSIS  100 mcg Intravenous Q Fri-HD  . docusate  100 mg Per Tube BID  . feeding supplement (NEPRO CARB STEADY)  237 mL Per Tube TID PC & HS  . feeding supplement (PROSource TF)  45 mL Per Tube BID  . Gerhardt's butt cream   Topical TID  . heparin sodium (porcine)      . insulin aspart  0-20 Units Subcutaneous Q4H  . levETIRAcetam  750 mg Per Tube Q24H  . mouth rinse  15 mL Mouth Rinse 10 times per day  . multivitamin  1 tablet Per Tube QHS  . pantoprazole sodium  40 mg Per Tube QHS  . phenytoin  100 mg Per Tube Q8H  . polyethylene glycol  17 g Per Tube TID  . senna-docusate  1 tablet Per Tube BID  . sodium chloride flush  10-40 mL Intracatheter  Q12H   Continuous Infusions: . sodium chloride Stopped (12/30/19 2139)  . sodium chloride    . sodium chloride     PRN Meds:.sodium chloride, sodium chloride, sodium chloride, acetaminophen, alteplase, bisacodyl, fentaNYL (SUBLIMAZE) injection, heparin, hydrALAZINE, lidocaine (PF), lidocaine-prilocaine, midazolam, ondansetron (ZOFRAN) IV, oxyCODONE, pentafluoroprop-tetrafluoroeth, senna-docusate, sodium chloride flush   Donetta Potts,  MD 01/23/2020, 9:15 AM

## 2020-01-23 NOTE — Progress Notes (Signed)
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  On 12/13/2019, PCCM was consulted for tachypnea and enlarging left-sided pleural effusion, thoracentesis showed hemothorax.  CT of the chest showed obstruction of her left mainstem bronchus and loculated left pleural effusion.  She was mated to ICU under PCCM service on 12/08/2019 for chest tube placement and bronchoscopy.  She underwent intrapleural TPA but required VATS eventually.  Her course was complicated by ICH, seizures, AKI, requiring CRRT.  Patient subsequently had tracheostomy placed on 12/22/2019, NG tube and tunneled vascular catheter placed on 01/13/2020.  Subsequently patient was started on intermittent hemodialysis.  Significant Hospital events 4/16 IR thrombectomy R MCA stroke 5/6 transfer to [facility] 5/29 PCCM consult left pleural effusion, thoracentesis 5/30 worsening tachypnea. Moved to ICU for intubation to facilitate bronchoscopy and chest tube placement 5/31 TPA/ pulmonzyme started for 3 days for persistent left effusion 6/01 Afebrile, low-dose Levophed, Precedex, Minimal output on chest tube- pulmozyme / tpa x 2,  6/02 pulmozyme and TPA completed 3 days 6/03 afib with RVR placed on amio gtt overnight , 1U PRBC for Hb 7.1  6/05 extubated successfully, Repeat TPA/Pulmonzyme given again  6/06 acute decompensation overnight, hemorrhagic shock >to OR for left VATS with 2L hemothorax drained. CT head with new ICH>neurosurgery consulted 6/09  More awake but agitated on PSV weaning  6/12: Seizures + on EEG 6/22 trach 6/29 g tube and tunneled vascular catheter placed 7/2: Care transferred to [facility]  Subjective   Patient seen and examined, vomited this morning.   Assessment/Plan:     1. Acute respiratory failure with hypoxia s/p tracheostomy-mucous plugging, suspected aspiration, left-sided loculated effusion complicated with hemothorax status post VATS decortication.  Patient has a long hospitalization and was intubated but subsequently underwent tracheostomy on 01/06/2020.  Patient is requiring mechanical ventilation intermittently.  Wean off as able. Chest x-ray done yesterday shows left-sided pleural effusion with mid and left lung base opacity. Also shows improved aeration in right lung fields. Will continue to monitor. 2. Acute kidney injury-oliguric ischemic ATN secondary to hemorrhagic shock.  Refractory to IV diuretics, started on CRRT from 12/26/2019 6 08/23/2019.  Subsequently started on hemodialysis from 01/05/2020.  Patient is status post right IJ tunneled dialysis catheter placement by IR on 01/13/2020.  Nephrology following and managing hemodialysis.  Patient is a poor candidate for long-term hemodialysis in this current state. 3. History of right MCA stroke-s/p thrombectomy with residual left-sided paralysis s/p right hemicraniectomy complicated by ICH after intrapleural TPA. 4. Dysphagia-patient without new ICH on 01/02/2020.  Manage medically as per neurosurgery recommendations.  G-tube was placed on 01/13/2020 and feeding was started.  We will hold the tube feeds as patient vomited this morning.  PEG tube vent to gravity. 5. 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  EEG had shown status epilepticus.  Continue phenytoin via G-tube.  Continue Keppra.  As needed Ativan. 6. Chronic atrial fibrillation-patient has history of sick sinus syndrome s/p pacemaker placement.  Continue amiodarone, aspirin. 7. Protein  calorie malnutrition-tube feed is currently on hold. 8. Diabetes mellitus type 2 with hyperglycemia and hypoglycemia  episodes-continue sliding scale insulin with NovoLog. 9. Hyponatremia-patient has mild hyponatremia, will continue to monitor. 10. Anemia chronic disease-secondary to chronic illness hemoglobin has been stable.  Patient is received multiple PRBCs transfusions.  Will transfuse for hemoglobin 7.0. 11. Hypertension-blood pressure is controlled. 12. Overall poor prognosis.  Patient is very deconditioned and has poor prognosis.  Palliative care was consulted and has signed off.  Patient will need LTAC placement.    SpO2: 96 % O2 Flow Rate (L/min): 5 L/min FiO2 (%): 28 %     Lab Results  Component Value Date   SARSCOV2NAA NEGATIVE 10/31/2019     CBG: Recent Labs  Lab 01/23/20 0008 01/23/20 0337 01/23/20 0818 01/23/20 1149 01/23/20 1313  GLUCAP 155* 107* 94 145* 138*    CBC: Recent Labs  Lab 01/17/20 0505 01/17/20 0505 01/19/20 0700 01/20/20 0554 01/21/20 0445 01/22/20 0500 01/23/20 0507  WBC 12.8*   < > 12.4* 10.6* 12.4* 14.8* 13.6*  NEUTROABS 10.1*  --   --  8.0* 9.6* 12.5* 10.4*  HGB 8.9*   < > 8.1* 8.1* 7.9* 8.6* 8.0*  HCT 29.3*   < > 26.3* 26.8* 26.2* 28.9* 26.0*  MCV 98.0   < > 97.0 97.1 96.0 96.3 93.9  PLT 175   < > 131* 143* 137* 162 175   < > = values in this interval not displayed.    Basic Metabolic Panel: Recent Labs  Lab 01/19/20 0319 01/20/20 0554 01/21/20 0445 01/22/20 0500 01/23/20 0507  NA 132* 130* 130* 133* 132*  K 4.4 3.4* 3.9 4.2 4.3  CL 95* 93* 92* 96* 95*  CO2 '25 26 26 27 25  ' GLUCOSE 135* 134* 168* 140* 98  BUN 81* 48* 72* 43* 75*  CREATININE 4.92* 3.26* 4.31* 2.87* 4.13*  CALCIUM 7.9* 7.9* 8.1* 8.4* 8.3*  MG  --  2.3  --   --   --   PHOS 6.1* 3.5 4.6 4.6 5.0*     Liver Function Tests: Recent Labs  Lab 01/19/20 0319 01/20/20 0554 01/21/20 0445 01/22/20 0500 01/23/20 0507  AST  --  189* 145*  --   --   ALT   --  70* 66*  --   --   ALKPHOS  --  860* 840*  --   --   BILITOT  --  0.6 0.7  --   --   PROT  --  8.4* 8.6*  --   --   ALBUMIN 1.2* 1.3* 1.3* 1.3* 1.2*     DVT prophylaxis: SCDs  Code Status: Full code  Family Communication: No family at bedside    Status is: Inpatient  Dispo: The patient is from: Home              Anticipated d/c is to: LTAC              Anticipated d/c date is: 01/23/2020              Patient currently not medically stable for discharge  Barrier to discharge-requiring hemodialysis, mechanical ventilation     Scheduled medications:  . amantadine  100 mg Per Tube BID  . amiodarone  200 mg Per Tube Daily  . aspirin  325 mg Per Tube Daily  . atorvastatin  40 mg Per Tube Daily  . chlorhexidine gluconate (MEDLINE KIT)  15 mL Mouth Rinse BID  . Chlorhexidine Gluconate Cloth  6 each Topical Q0600  . darbepoetin (ARANESP) injection - DIALYSIS  100 mcg Intravenous Q Fri-HD  .  docusate  100 mg Per Tube BID  . feeding supplement (NEPRO CARB STEADY)  237 mL Per Tube TID PC & HS  . feeding supplement (PROSource TF)  45 mL Per Tube BID  . Gerhardt's butt cream   Topical TID  . insulin aspart  0-20 Units Subcutaneous Q4H  . levETIRAcetam  750 mg Per Tube Q24H  . mouth rinse  15 mL Mouth Rinse 10 times per day  . multivitamin  1 tablet Per Tube QHS  . pantoprazole sodium  40 mg Per Tube QHS  . phenytoin  100 mg Per Tube Q8H  . polyethylene glycol  17 g Per Tube TID  . senna-docusate  1 tablet Per Tube BID  . sodium chloride flush  10-40 mL Intracatheter Q12H    Consultants:  PCCM  Palliative care  Nephrology  Neurology  Neurosurgery  IR  Procedures: 12/13/2019 thoracentesis: 1.5 L bloody fluid Bronchoscopy on 12/11/2019 ETT: 12/01/2019-01/06/2020 Tracheostomy: 01/06/2020 onwards Left pigtail chest tube 12/07/2019-12/19/2019 [facility] HD cath from 12/27/2019-01/13/2020 G-tube and tunneled vascular catheter placement on 01/13/2020  Overnight EEG on 12/27/2019:  Right frontocentral seizures x2   Antibiotics:   Anti-infectives (From admission, onward)   Start     Dose/Rate Route Frequency Ordered Stop   01/15/20 0745  ceFAZolin (ANCEF) IVPB 2g/100 mL premix  Status:  Discontinued        2 g 200 mL/hr over 30 Minutes Intravenous  Once 01/15/20 0739 01/18/20 0938   01/13/20 1645  ceFAZolin (ANCEF) IVPB 2g/100 mL premix        2 g 200 mL/hr over 30 Minutes Intravenous To Radiology 01/13/20 1630 01/13/20 1640   01/13/20 1630  ceFAZolin (ANCEF) IVPB 2g/100 mL premix  Status:  Discontinued        2 g 200 mL/hr over 30 Minutes Intravenous To ShortStay Surgical 01/13/20 1625 01/13/20 1906   01/13/20 0800  ceFAZolin (ANCEF) IVPB 2g/100 mL premix        2 g 200 mL/hr over 30 Minutes Intravenous To Radiology 01/12/20 1007 01/13/20 1836   01/12/20 1030  ceFAZolin (ANCEF) IVPB 2g/100 mL premix  Status:  Discontinued        2 g 200 mL/hr over 30 Minutes Intravenous To Radiology 01/12/20 0927 01/12/20 1007   12/27/19 1000  ceFEPIme (MAXIPIME) 2 g in sodium chloride 0.9 % 100 mL IVPB  Status:  Discontinued        2 g 200 mL/hr over 30 Minutes Intravenous Every 12 hours 12/26/19 1412 12/27/19 1017   12/26/19 1000  ceFEPIme (MAXIPIME) 1 g in sodium chloride 0.9 % 100 mL IVPB  Status:  Discontinued        1 g 200 mL/hr over 30 Minutes Intravenous Every 24 hours 12/25/19 1152 12/26/19 1412   12/16/2019 1244  ceFAZolin (ANCEF) IVPB 2g/100 mL premix  Status:  Discontinued        2 g 200 mL/hr over 30 Minutes Intravenous 30 min pre-op 01/13/2020 1244 12/20/2019 1823   12/27/2019 0600  ceFEPIme (MAXIPIME) 1 g in sodium chloride 0.9 % 100 mL IVPB  Status:  Discontinued        1 g 200 mL/hr over 30 Minutes Intravenous Every 8 hours 12/29/2019 0337 01/03/2020 0340   01/05/2020 0400  vancomycin (VANCOCIN) IVPB 1000 mg/200 mL premix        1,000 mg 200 mL/hr over 60 Minutes Intravenous  Once 12/24/2019 0337 01/07/2020 0653   01/12/2020 0400  ceFEPIme (MAXIPIME) 2 g in sodium chloride  0.9 %  100 mL IVPB  Status:  Discontinued        2 g 200 mL/hr over 30 Minutes Intravenous Every 24 hours 12/24/2019 0340 12/25/19 1152   12/20/19 2200  amoxicillin-clavulanate (AUGMENTIN) 500-125 MG per tablet 500 mg  Status:  Discontinued        1 tablet Oral 2 times daily 12/20/19 1412 12/19/2019 0337   12/20/19 1415  amoxicillin-clavulanate (AUGMENTIN) 875-125 MG per tablet 1 tablet  Status:  Discontinued        1 tablet Oral Every 12 hours 12/20/19 1407 12/20/19 1411   11/28/2019 1245  piperacillin-tazobactam (ZOSYN) IVPB 3.375 g  Status:  Discontinued        3.375 g 12.5 mL/hr over 240 Minutes Intravenous Every 8 hours 11/22/2019 1235 12/20/19 1407       Objective   Vitals:   01/23/20 1100 01/23/20 1120 01/23/20 1153 01/23/20 1200  BP: (!) 114/51   (!) 137/47  Pulse: 71 71  75  Resp: (!) 25 (!) 35  (!) 34  Temp:   98.1 F (36.7 C)   TempSrc:   Oral   SpO2: 100%   96%  Weight:      Height:        Intake/Output Summary (Last 24 hours) at 01/23/2020 1434 Last data filed at 01/23/2020 1045 Gross per 24 hour  Intake 100 ml  Output 2290 ml  Net -2190 ml    07/07 1901 - 07/09 0700 In: 747  Out: 500   Filed Weights   01/23/20 0342 01/23/20 0646 01/23/20 1045  Weight: 58.2 kg 58.8 kg 56.8 kg    Physical Examination:   General-appears in no acute distress  Heart-S1-S2, regular, no murmur auscultated  Lungs-clear to auscultation bilaterally, no wheezing or crackles auscultated  Abdomen-soft, nontender, no organomegaly  Extremities-no edema in the lower extremities  Neuro-alert, oriented x3, no focal deficit noted    Data Reviewed:   Recent Results (from the past 240 hour(s))  Culture, respiratory     Status: None   Collection Time: 01/14/20 12:46 PM   Specimen: Tracheal Aspirate; Respiratory  Result Value Ref Range Status   Specimen Description TRACHEAL ASPIRATE  Final   Special Requests NONE  Final   Gram Stain   Final    NO WBC SEEN NO ORGANISMS  SEEN Performed at [facility], [address], [place], [place] [address]    Culture FEW METHICILLIN RESISTANT STAPHYLOCOCCUS AUREUS  Final   Report Status 01/17/2020 FINAL  Final   Organism ID, Bacteria METHICILLIN RESISTANT STAPHYLOCOCCUS AUREUS  Final      Susceptibility   Methicillin resistant staphylococcus aureus - MIC*    CIPROFLOXACIN >=8 RESISTANT Resistant     ERYTHROMYCIN >=8 RESISTANT Resistant     GENTAMICIN <=0.5 SENSITIVE Sensitive     OXACILLIN >=4 RESISTANT Resistant     TETRACYCLINE <=1 SENSITIVE Sensitive     VANCOMYCIN 1 SENSITIVE Sensitive     TRIMETH/SULFA <=10 SENSITIVE Sensitive     CLINDAMYCIN <=0.25 SENSITIVE Sensitive     RIFAMPIN <=0.5 SENSITIVE Sensitive     Inducible Clindamycin NEGATIVE Sensitive     * FEW METHICILLIN RESISTANT STAPHYLOCOCCUS AUREUS  MRSA PCR Screening     Status: None   Collection Time: 01/18/20  8:56 AM   Specimen: Nasal Mucosa; Nasopharyngeal  Result Value Ref Range Status   MRSA by PCR NEGATIVE NEGATIVE Final    Comment:        The GeneXpert MRSA Assay (FDA approved for NASAL  specimens only), is one component of a comprehensive MRSA colonization surveillance program. It is not intended to diagnose MRSA infection nor to guide or monitor treatment for MRSA infections. Performed at [facility], [place] [address], [place], [place] [address]     No results for input(s): LIPASE, AMYLASE in the last 168 hours. No results for input(s): AMMONIA in the last 168 hours.  Cardiac Enzymes: No results for input(s): CKTOTAL, CKMB, CKMBINDEX, TROPONINI in the last 168 hours. BNP (last 3 results) Recent Labs    11/11/19 2226  BNP 93.9    Studies:  DG Abd Portable 1V  Result Date: 01/23/2020 CLINICAL DATA:  Vomiting. EXAM: PORTABLE ABDOMEN - 1 VIEW COMPARISON:  CT 01/09/2020.  12/31/2019. FINDINGS: Cardiac pacer lead tip noted over the right ventricle. Gastrostomy tube noted with tip over the stomach. No gastric  distention. Mildly prominent air-filled loops of small and large bowel. Mild adynamic ileus cannot be excluded. Follow-up exam can be obtained to demonstrate resolution. No free air identified. Linear radiopacity noted over the right abdomen, this may be outside the patient. Clinical correlation suggested. Stable calcified splenic density. No acute bony abnormality. IMPRESSION: Gastrostomy tube noted with tip over the stomach. No gastric distention. 2. Mild prominence of small and large bowel suggesting adynamic ileus. Follow-up exam can be obtained. Electronically Signed   By: Marcello Moores  Register   On: 01/23/2020 12:43       [facility] [place]   [facility] If 7PM-7AM, please contact night-coverage at [email or web address], Office  [phone]   01/23/2020, 2:34 PM  LOS: 40 days

## 2020-01-24 LAB — CBC WITH DIFFERENTIAL/PLATELET
Abs Immature Granulocytes: 0.08 10*3/uL — ABNORMAL HIGH (ref 0.00–0.07)
Basophils Absolute: 0.1 10*3/uL (ref 0.0–0.1)
Basophils Relative: 1 %
Eosinophils Absolute: 0.3 10*3/uL (ref 0.0–0.5)
Eosinophils Relative: 2 %
HCT: 27 % — ABNORMAL LOW (ref 36.0–46.0)
Hemoglobin: 8.2 g/dL — ABNORMAL LOW (ref 12.0–15.0)
Immature Granulocytes: 1 %
Lymphocytes Relative: 11 %
Lymphs Abs: 1.5 10*3/uL (ref 0.7–4.0)
MCH: 29 pg (ref 26.0–34.0)
MCHC: 30.4 g/dL (ref 30.0–36.0)
MCV: 95.4 fL (ref 80.0–100.0)
Monocytes Absolute: 1.3 10*3/uL — ABNORMAL HIGH (ref 0.1–1.0)
Monocytes Relative: 10 %
Neutro Abs: 10.1 10*3/uL — ABNORMAL HIGH (ref 1.7–7.7)
Neutrophils Relative %: 75 %
Platelets: 207 10*3/uL (ref 150–400)
RBC: 2.83 MIL/uL — ABNORMAL LOW (ref 3.87–5.11)
RDW: 18.7 % — ABNORMAL HIGH (ref 11.5–15.5)
WBC: 13.3 10*3/uL — ABNORMAL HIGH (ref 4.0–10.5)
nRBC: 0 % (ref 0.0–0.2)

## 2020-01-24 LAB — RENAL FUNCTION PANEL
Albumin: 1.3 g/dL — ABNORMAL LOW (ref 3.5–5.0)
Anion gap: 10 (ref 5–15)
BUN: 41 mg/dL — ABNORMAL HIGH (ref 8–23)
CO2: 28 mmol/L (ref 22–32)
Calcium: 8.1 mg/dL — ABNORMAL LOW (ref 8.9–10.3)
Chloride: 96 mmol/L — ABNORMAL LOW (ref 98–111)
Creatinine, Ser: 2.63 mg/dL — ABNORMAL HIGH (ref 0.44–1.00)
GFR calc Af Amer: 21 mL/min — ABNORMAL LOW (ref >60)
GFR calc non Af Amer: 18 mL/min — ABNORMAL LOW (ref >60)
Glucose, Bld: 111 mg/dL — ABNORMAL HIGH (ref 70–99)
Phosphorus: 4.1 mg/dL (ref 2.5–4.6)
Potassium: 4.1 mmol/L (ref 3.5–5.1)
Sodium: 134 mmol/L — ABNORMAL LOW (ref 135–145)

## 2020-01-24 LAB — GLUCOSE, CAPILLARY
Glucose-Capillary: 108 mg/dL — ABNORMAL HIGH (ref 70–99)
Glucose-Capillary: 138 mg/dL — ABNORMAL HIGH (ref 70–99)
Glucose-Capillary: 148 mg/dL — ABNORMAL HIGH (ref 70–99)
Glucose-Capillary: 181 mg/dL — ABNORMAL HIGH (ref 70–99)
Glucose-Capillary: 98 mg/dL (ref 70–99)
Glucose-Capillary: 99 mg/dL (ref 70–99)

## 2020-01-24 MED ORDER — PROSOURCE TF PO LIQD
45.0000 mL | Freq: Every day | ORAL | Status: DC
  Administered 2020-01-25 – 2020-01-30 (×6): 45 mL
  Filled 2020-01-24 (×6): qty 45

## 2020-01-24 MED ORDER — FREE WATER
200.0000 mL | Freq: Four times a day (QID) | Status: DC
  Administered 2020-01-24 – 2020-01-25 (×3): 200 mL

## 2020-01-24 MED ORDER — NEPRO/CARBSTEADY PO LIQD
1000.0000 mL | ORAL | Status: DC
  Administered 2020-01-24: 1000 mL

## 2020-01-24 MED ORDER — NEPRO/CARBSTEADY PO LIQD
1000.0000 mL | ORAL | Status: DC
  Administered 2020-01-26 – 2020-01-29 (×3): 1000 mL

## 2020-01-24 NOTE — Progress Notes (Signed)
Patient ID: Krystal Little, female   DOB: [birth year], 64 y.o.   MRN: [id number] S: No events overnight and tolerated HD O:BP (!) 145/56 (BP Location: Right Leg)    Pulse 73    Temp 99.4 F (37.4 C) (Oral)    Resp (!) 27    Ht '5\' 4"'  (1.626 m)    Wt 52.3 kg    SpO2 96%    BMI 19.79 kg/m   Intake/Output Summary (Last 24 hours) at 01/24/2020 0958 Last data filed at 01/24/2020 0800 Gross per 24 hour  Intake 474 ml  Output 2040 ml  Net -1566 ml   Intake/Output: I/O last 3 completed shifts: In: 837 [Other:100; NG/GT:474] Out: 2340 [Urine:25; Other:1990; Stool:325]  Intake/Output this shift:  No intake/output data recorded. Weight change: -1.4 kg Gen: awake and alert, responds to questions, no distress, on vent via trach CVS: RRR, no rub Resp: cta Abd: +BS, soft, NT/ND Ext: no edema  Recent Labs  Lab 01/18/20 0500 01/19/20 0319 01/20/20 0554 01/21/20 0445 01/22/20 0500 01/23/20 0507 01/24/20 0313  NA 131* 132* 130* 130* 133* 132* 134*  K 4.6 4.4 3.4* 3.9 4.2 4.3 4.1  CL 96* 95* 93* 92* 96* 95* 96*  CO2 '24 25 26 26 27 25 28  ' GLUCOSE 133* 135* 134* 168* 140* 98 111*  BUN 60* 81* 48* 72* 43* 75* 41*  CREATININE 4.17* 4.92* 3.26* 4.31* 2.87* 4.13* 2.63*  ALBUMIN 1.2* 1.2* 1.3* 1.3* 1.3* 1.2* 1.3*  CALCIUM 7.8* 7.9* 7.9* 8.1* 8.4* 8.3* 8.1*  PHOS 5.1* 6.1* 3.5 4.6 4.6 5.0* 4.1  AST  --   --  189* 145*  --   --   --   ALT  --   --  70* 66*  --   --   --    Liver Function Tests: Recent Labs  Lab 01/20/20 0554 01/20/20 0554 01/21/20 0445 01/21/20 0445 01/22/20 0500 01/23/20 0507 01/24/20 0313  AST 189*  --  145*  --   --   --   --   ALT 70*  --  66*  --   --   --   --   ALKPHOS 860*  --  840*  --   --   --   --   BILITOT 0.6  --  0.7  --   --   --   --   PROT 8.4*  --  8.6*  --   --   --   --   ALBUMIN 1.3*   < > 1.3*   < > 1.3* 1.2* 1.3*   < > = values in this interval not displayed.   No results for input(s): LIPASE, AMYLASE in the last 168 hours. No results for  input(s): AMMONIA in the last 168 hours. CBC: Recent Labs  Lab 01/20/20 0554 01/20/20 0554 01/21/20 0445 01/21/20 0445 01/22/20 0500 01/23/20 0507 01/24/20 0313  WBC 10.6*   < > 12.4*   < > 14.8* 13.6* 13.3*  NEUTROABS 8.0*   < > 9.6*   < > 12.5* 10.4* 10.1*  HGB 8.1*   < > 7.9*   < > 8.6* 8.0* 8.2*  HCT 26.8*   < > 26.2*   < > 28.9* 26.0* 27.0*  MCV 97.1  --  96.0  --  96.3 93.9 95.4  PLT 143*   < > 137*   < > 162 175 207   < > = values in this interval not displayed.   Cardiac Enzymes:  No results for input(s): CKTOTAL, CKMB, CKMBINDEX, TROPONINI in the last 168 hours. CBG: Recent Labs  Lab 01/23/20 1313 01/23/20 1536 01/23/20 1939 01/24/20 0006 01/24/20 0738  GLUCAP 138* 86 78 181* 98    Iron Studies: No results for input(s): IRON, TIBC, TRANSFERRIN, FERRITIN in the last 72 hours. Studies/Results: DG Abd Portable 1V  Result Date: 01/23/2020 CLINICAL DATA:  Vomiting. EXAM: PORTABLE ABDOMEN - 1 VIEW COMPARISON:  CT 01/09/2020.  12/31/2019. FINDINGS: Cardiac pacer lead tip noted over the right ventricle. Gastrostomy tube noted with tip over the stomach. No gastric distention. Mildly prominent air-filled loops of small and large bowel. Mild adynamic ileus cannot be excluded. Follow-up exam can be obtained to demonstrate resolution. No free air identified. Linear radiopacity noted over the right abdomen, this may be outside the patient. Clinical correlation suggested. Stable calcified splenic density. No acute bony abnormality. IMPRESSION: Gastrostomy tube noted with tip over the stomach. No gastric distention. 2. Mild prominence of small and large bowel suggesting adynamic ileus. Follow-up exam can be obtained. Electronically Signed   By: Marcello Moores  Register   On: 01/23/2020 12:43    amantadine  100 mg Per Tube BID   amiodarone  200 mg Per Tube Daily   aspirin  325 mg Per Tube Daily   atorvastatin  40 mg Per Tube Daily   chlorhexidine gluconate (MEDLINE KIT)  15 mL Mouth  Rinse BID   Chlorhexidine Gluconate Cloth  6 each Topical Q0600   darbepoetin (ARANESP) injection - DIALYSIS  100 mcg Intravenous Q Fri-HD   docusate  100 mg Per Tube BID   feeding supplement (NEPRO CARB STEADY)  1,000 mL Per Tube Q24H   feeding supplement (PROSource TF)  45 mL Per Tube BID   free water  200 mL Per Tube Q6H   Gerhardt's butt cream   Topical TID   insulin aspart  0-20 Units Subcutaneous Q4H   levETIRAcetam  750 mg Per Tube Q24H   mouth rinse  15 mL Mouth Rinse 10 times per day   multivitamin  1 tablet Per Tube QHS   pantoprazole sodium  40 mg Per Tube QHS   phenytoin  100 mg Per Tube Q8H   polyethylene glycol  17 g Per Tube TID   senna-docusate  1 tablet Per Tube BID   sodium chloride flush  10-40 mL Intracatheter Q12H    BMET    Component Value Date/Time   NA 134 (L) 01/24/2020 0313   NA 140 08/18/2019 1120   K 4.1 01/24/2020 0313   CL 96 (L) 01/24/2020 0313   CO2 28 01/24/2020 0313   GLUCOSE 111 (H) 01/24/2020 0313   BUN 41 (H) 01/24/2020 0313   BUN 12 08/18/2019 1120   CREATININE 2.63 (H) 01/24/2020 0313   CREATININE 0.73 05/18/2016 0919   CALCIUM 8.1 (L) 01/24/2020 0313   GFRNONAA 18 (L) 01/24/2020 0313   GFRNONAA 89 05/18/2016 0919   GFRAA 21 (L) 01/24/2020 0313   GFRAA >89 05/18/2016 0919   CBC    Component Value Date/Time   WBC 13.3 (H) 01/24/2020 0313   RBC 2.83 (L) 01/24/2020 0313   HGB 8.2 (L) 01/24/2020 0313   HGB 15.2 08/18/2019 1120   HCT 27.0 (L) 01/24/2020 0313   HCT 45.9 08/18/2019 1120   PLT 207 01/24/2020 0313   PLT 168 08/18/2019 1120   MCV 95.4 01/24/2020 0313   MCV 94 08/18/2019 1120   MCH 29.0 01/24/2020 0313   MCHC 30.4 01/24/2020 0313  RDW 18.7 (H) 01/24/2020 0313   RDW 12.2 08/18/2019 1120   LYMPHSABS 1.5 01/24/2020 0313   MONOABS 1.3 (H) 01/24/2020 0313   EOSABS 0.3 01/24/2020 0313   BASOSABS 0.1 01/24/2020 0313   Assessment/Plan:  1. AKI- due to ischemic ATN in setting of hemorrhagic shock  requiring pressor support and initiated on CRRT 6/11-6/20/21 and transitioned to IHD on 6/21 and has remained oliguric and dialysis dependent. 1. Will continue with HD on MWF schedule for now. 2. Has been tolerating HD 3. No signs of renal recovery at this time. 2. Left sided hemothorax- s/p VATS on 6/6 and s/p removal of one chest tube 6/21 3. RMCA stroke- s/p thrombectomy 4/16 c/b worsening cerebral edema with mass effect s/p decompressive right hemicraniectomy and placement of bone flap on 11/02/19, new ICH 12/23/2019. 4. 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 Chronic atrial fibrillation- on amio and asa 6. HTN- stable 7. Seizure disorder- per neuro 8. Nutrition- s/p PEG by IR on 6/29 9. ABLA/Anemia of critical illness- s/p multiple transfusions 10. Disposition- will need LTAC but poor overall prognosis. Continue with ongoing discussions with family regarding poor prognosis for meaningful recovery. Donetta Potts, MD [employer] [phone]

## 2020-01-24 NOTE — Progress Notes (Signed)
Nutrition Follow-up  DOCUMENTATION CODES:    Not applicable  INTERVENTION:  Increase Nepro formula via PEG by 10 ml every 4 hours to goal rate of 45 ml/hr.   Provide 45 ml Prosource TF once daily per tube.   Tube feeding to provide 1984 kcal (100% of needs), 98 grams of protein, and 788 ml free water.   Free water flushes of 200 ml q 6 hours per MD order. Total free water: 1588 ml/day.   Continue Rena-Vite once daily.   NUTRITION DIAGNOSIS:   Inadequate oral intake related to acute illness as evidenced by NPO status; ongoing  GOAL:   Patient will meet greater than or equal to 90% of their needs; met with TF  MONITOR:   Vent status, TF tolerance, Labs, Weight trends  REASON FOR ASSESSMENT:   Consult, Ventilator Enteral/tube feeding initiation and management  ASSESSMENT:   64 yo female admitted from [place] after being at [facility] in April in setting of R. MCA stroke requiring thrombectomy, developed significant brain edema requiring craniectomy. Pt developed L pleural effusion on Rehab requiring thoracentesis and ultimately required intubation due to resp failure from occlusion of L. Mainstem bronchus and large L hemothorax, bronch and chest tube placement. Pt did require Cortrak placement with TF initially but was taking po prior to readmission from Rehab. PMH DM, HTN, R. MCA stroke, SSS  5/29 Thoracentesis 5/30 Intubated, Chest tube 6/04Cortrak placed 6/06 Left VATS with drainage of left hemothorax 6/11 CRRT initiated 6/12 Seizures+ on EEG 6/20 CRRT stopped 6/21 First iHD 6/22 Trach placement 6/29 20 Fr. PEG tube placed , RIJ tunneled HD cath placed  Pt continues to require vent via trach intermittently. RD contacted via MD and RN regarding modification in tube feeding orders. Pt with vomiting with bolus feeds yesterday, thus feedings were held. Continuous tube feeds restarted this AM at rate of 30 ml/hr. RD asked to assess for goal rate while on continuous tube  feeds. RD to modify orders.   Labs and medications reviewed.   Diet Order:   Diet Order            Diet NPO time specified  Diet effective now                 EDUCATION NEEDS:   Not appropriate for education at this time  Skin:  Skin Assessment: Reviewed RN Assessment Skin Integrity Issues:: Incisions, Other (Comment) Incisions: incision to groin, head, abdomen Other: fissure to mid coccyx, venous stasis ulcers on legs, MASD to buttock/sacrum  Last BM:  7/9  Height:   Ht Readings from Last 1 Encounters:  11/28/2019 '5\' 4"'  (1.626 m)    Weight:   Wt Readings from Last 1 Encounters:  01/24/20 52.3 kg   BMI:  Body mass index is 19.79 kg/m.  Estimated Nutritional Needs:   Kcal:  3709-6438 kcals  Protein:  90-120 g  Fluid:  1000 mL plus UOP  Corrin Parker, MS, RD, LDN RD pager number/after hours weekend pager number on Amion.

## 2020-01-24 NOTE — Progress Notes (Signed)
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  On 12/13/2019, PCCM was consulted for tachypnea and enlarging left-sided pleural effusion, thoracentesis showed hemothorax.  CT of the chest showed obstruction of her left mainstem bronchus and loculated left pleural effusion.  She was admitted  to ICU under PCCM service on 12/13/2019 for chest tube placement and bronchoscopy.  She underwent intrapleural TPA but required VATS eventually.  Her course was complicated by ICH, seizures, AKI, requiring CRRT.  Patient subsequently had tracheostomy placed on 12/22/2019, NG tube and tunneled vascular catheter placed on 01/13/2020.  Subsequently patient was started on intermittent hemodialysis.  Significant Hospital events 4/16 IR thrombectomy R MCA stroke 5/6 transfer to [facility] Inpatient [facility] 5/29 PCCM consult left pleural effusion, thoracentesis 5/30 worsening tachypnea. Moved to ICU for intubation to facilitate bronchoscopy and chest tube placement 5/31 TPA/ pulmonzyme started for 3 days for persistent left effusion 6/01 Afebrile, low-dose Levophed, Precedex, Minimal output on chest tube- pulmozyme / tpa x 2,  6/02 pulmozyme and TPA completed 3 days 6/03 afib with RVR placed on amio gtt overnight , 1U PRBC for Hb 7.1  6/05 extubated successfully, Repeat TPA/Pulmonzyme given again  6/06 acute decompensation overnight, hemorrhagic shock >to OR for left VATS with 2L hemothorax drained. CT head with new ICH>neurosurgery  consulted 6/09 More awake but agitated on PSV weaning  6/12: Seizures + on EEG 6/22 trach 6/29 g tube and tunneled vascular catheter placed 7/2: Care transferred to [place]  Subjective   Patient seen and examined, tube feeding was held yesterday due to vomiting.  No vomiting overnight.   Assessment/Plan:     1. Acute respiratory failure with hypoxia s/p tracheostomy-mucous plugging, suspected aspiration, left-sided loculated effusion complicated with hemothorax status post VATS decortication.  Patient has a long hospitalization and was intubated but subsequently underwent tracheostomy on 01/06/2020.  Patient is requiring mechanical ventilation intermittently.  Wean off as able. Chest x-ray done yesterday shows left-sided pleural effusion with mid and left lung base opacity. Also shows improved aeration in right lung fields. Will continue to monitor. 2. Acute kidney injury-oliguric ischemic ATN secondary to hemorrhagic shock.  Refractory to IV diuretics, started on CRRT from 12/26/2019 6 08/23/2019.  Subsequently started on hemodialysis from 01/05/2020.  Patient is status post right IJ tunneled dialysis catheter placement by IR on 01/13/2020.  Nephrology following and managing hemodialysis.  Renal function is slowly improving.  Today creatinine is 2.63.  Patient is a poor candidate for long-term hemodialysis in this current state. 3. History of right MCA stroke-s/p thrombectomy with residual left-sided paralysis s/p right hemicraniectomy complicated by ICH after intrapleural TPA. 4. Dysphagia-patient without new ICH on 01/09/2020.  Manage medically as per neurosurgery recommendations.  G-tube was placed on 01/13/2020 and feeding was started.  Tube feeding was held yesterday due to vomiting.  She was getting bolus tube feeding.  Will restart tube feeds at a low dose of 30 cc/h.  We will again consult nutrition for further recommendations.   5. 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  EEG had  shown status epilepticus.  Continue phenytoin via G-tube.  Continue Keppra.  As needed Ativan. 6. Chronic atrial fibrillation-patient has history of sick sinus syndrome s/p pacemaker placement.  Continue amiodarone, aspirin. 7. Protein calorie malnutrition-tube feed is currently on hold. 8. Diabetes mellitus type 2 with hyperglycemia and hypoglycemia episodes-continue sliding scale insulin with NovoLog. 9. Hyponatremia-patient has mild hyponatremia, will continue to monitor. 10. Anemia chronic disease-secondary to chronic illness hemoglobin has been stable.  Patient is received multiple PRBCs transfusions.  Will transfuse for hemoglobin 7.0. 11. Hypertension-blood pressure is controlled. 12. Overall poor prognosis.  Patient is very deconditioned and has poor prognosis.  Palliative care was consulted and has signed off.  Patient will need LTAC placement.    SpO2: 96 % O2 Flow Rate (L/min): 10 L/min FiO2 (%): 40 %     Lab Results  Component Value Date   SARSCOV2NAA NEGATIVE 10/31/2019     CBG: Recent Labs  Lab 01/23/20 1313 01/23/20 1536 01/23/20 1939 01/24/20 0006 01/24/20 0738  GLUCAP 138* 86 78 181* 98    CBC: Recent Labs  Lab 01/20/20 0554 01/21/20 0445 01/22/20 0500 01/23/20 0507 01/24/20 0313  WBC 10.6* 12.4* 14.8* 13.6* 13.3*  NEUTROABS 8.0* 9.6* 12.5* 10.4* 10.1*  HGB 8.1* 7.9* 8.6* 8.0* 8.2*  HCT 26.8* 26.2* 28.9* 26.0* 27.0*  MCV 97.1 96.0 96.3 93.9 95.4  PLT 143* 137* 162 175 962    Basic Metabolic Panel: Recent Labs  Lab 01/20/20 0554 01/21/20 0445 01/22/20 0500 01/23/20 0507 01/24/20 0313  NA 130* 130* 133* 132* 134*  K 3.4* 3.9 4.2 4.3 4.1  CL 93* 92* 96* 95* 96*  CO2 '26 26 27 25 28  ' GLUCOSE 134* 168* 140* 98 111*  BUN 48* 72* 43* 75* 41*  CREATININE 3.26* 4.31* 2.87* 4.13* 2.63*  CALCIUM 7.9* 8.1* 8.4* 8.3* 8.1*  MG 2.3  --   --   --   --   PHOS 3.5 4.6 4.6 5.0* 4.1     Liver Function Tests: Recent Labs  Lab 01/20/20 0554  01/21/20 0445 01/22/20 0500 01/23/20 0507 01/24/20 0313  AST 189* 145*  --   --   --   ALT 70* 66*  --   --   --   ALKPHOS 860* 840*  --   --   --   BILITOT 0.6 0.7  --   --   --   PROT 8.4* 8.6*  --   --   --   ALBUMIN 1.3* 1.3* 1.3* 1.2* 1.3*     DVT prophylaxis: SCDs  Code Status: Full code  Family Communication: No family at bedside    Status is: Inpatient  Dispo: The patient is from: Home              Anticipated d/c is to: LTAC              Anticipated d/c date is: 01/29/2020              Patient currently not medically stable for discharge  Barrier to discharge-requiring hemodialysis, mechanical ventilation     Scheduled medications:   amantadine  100 mg Per Tube BID   amiodarone  200 mg Per Tube Daily   aspirin  325 mg Per Tube Daily   atorvastatin  40 mg Per Tube Daily   chlorhexidine gluconate (MEDLINE KIT)  15 mL Mouth Rinse BID   Chlorhexidine Gluconate Cloth  6 each Topical Q0600   darbepoetin (ARANESP) injection - DIALYSIS  100 mcg Intravenous Q Fri-HD   docusate  100 mg  Per Tube BID   feeding supplement (NEPRO CARB STEADY)  1,000 mL Per Tube Q24H   feeding supplement (PROSource TF)  45 mL Per Tube BID   free water  200 mL Per Tube Q6H   Gerhardt's butt cream   Topical TID   insulin aspart  0-20 Units Subcutaneous Q4H   levETIRAcetam  750 mg Per Tube Q24H   mouth rinse  15 mL Mouth Rinse 10 times per day   multivitamin  1 tablet Per Tube QHS   pantoprazole sodium  40 mg Per Tube QHS   phenytoin  100 mg Per Tube Q8H   polyethylene glycol  17 g Per Tube TID   senna-docusate  1 tablet Per Tube BID   sodium chloride flush  10-40 mL Intracatheter Q12H    Consultants:  PCCM  Palliative care  Nephrology  Neurology  Neurosurgery  IR  Procedures: 12/13/2019 thoracentesis: 1.5 L bloody fluid Bronchoscopy on 12/15/2019 ETT: 11/15/2019-01/06/2020 Tracheostomy: 01/06/2020 onwards Left pigtail chest tube  11/22/2019-01/12/2020 [facility] HD cath from 12/27/2019-01/13/2020 G-tube and tunneled vascular catheter placement on 01/13/2020  Overnight EEG on 12/27/2019: Right frontocentral seizures x2   Antibiotics:   Anti-infectives (From admission, onward)   Start     Dose/Rate Route Frequency Ordered Stop   01/15/20 0745  ceFAZolin (ANCEF) IVPB 2g/100 mL premix  Status:  Discontinued        2 g 200 mL/hr over 30 Minutes Intravenous  Once 01/15/20 0739 01/18/20 0938   01/13/20 1645  ceFAZolin (ANCEF) IVPB 2g/100 mL premix        2 g 200 mL/hr over 30 Minutes Intravenous To Radiology 01/13/20 1630 01/13/20 1640   01/13/20 1630  ceFAZolin (ANCEF) IVPB 2g/100 mL premix  Status:  Discontinued        2 g 200 mL/hr over 30 Minutes Intravenous To ShortStay Surgical 01/13/20 1625 01/13/20 1906   01/13/20 0800  ceFAZolin (ANCEF) IVPB 2g/100 mL premix        2 g 200 mL/hr over 30 Minutes Intravenous To Radiology 01/12/20 1007 01/13/20 1836   01/12/20 1030  ceFAZolin (ANCEF) IVPB 2g/100 mL premix  Status:  Discontinued        2 g 200 mL/hr over 30 Minutes Intravenous To Radiology 01/12/20 0927 01/12/20 1007   12/27/19 1000  ceFEPIme (MAXIPIME) 2 g in sodium chloride 0.9 % 100 mL IVPB  Status:  Discontinued        2 g 200 mL/hr over 30 Minutes Intravenous Every 12 hours 12/26/19 1412 12/27/19 1017   12/26/19 1000  ceFEPIme (MAXIPIME) 1 g in sodium chloride 0.9 % 100 mL IVPB  Status:  Discontinued        1 g 200 mL/hr over 30 Minutes Intravenous Every 24 hours 12/25/19 1152 12/26/19 1412   01/05/2020 1244  ceFAZolin (ANCEF) IVPB 2g/100 mL premix  Status:  Discontinued        2 g 200 mL/hr over 30 Minutes Intravenous 30 min pre-op 12/20/2019 1244 12/19/2019 1823   01/09/2020 0600  ceFEPIme (MAXIPIME) 1 g in sodium chloride 0.9 % 100 mL IVPB  Status:  Discontinued        1 g 200 mL/hr over 30 Minutes Intravenous Every 8 hours 12/20/2019 0337 01/02/2020 0340   01/10/2020 0400  vancomycin (VANCOCIN) IVPB 1000 mg/200 mL premix         1,000 mg 200 mL/hr over 60 Minutes Intravenous  Once 01/13/2020 0337 01/14/2020 0653   01/01/2020 0400  ceFEPIme (MAXIPIME) 2 g in  sodium chloride 0.9 % 100 mL IVPB  Status:  Discontinued        2 g 200 mL/hr over 30 Minutes Intravenous Every 24 hours 01/14/2020 0340 12/25/19 1152   12/20/19 2200  amoxicillin-clavulanate (AUGMENTIN) 500-125 MG per tablet 500 mg  Status:  Discontinued        1 tablet Oral 2 times daily 12/20/19 1412 12/20/2019 0337   12/20/19 1415  amoxicillin-clavulanate (AUGMENTIN) 875-125 MG per tablet 1 tablet  Status:  Discontinued        1 tablet Oral Every 12 hours 12/20/19 1407 12/20/19 1411   11/21/2019 1245  piperacillin-tazobactam (ZOSYN) IVPB 3.375 g  Status:  Discontinued        3.375 g 12.5 mL/hr over 240 Minutes Intravenous Every 8 hours 12/05/2019 1235 12/20/19 1407       Objective   Vitals:   01/24/20 0700 01/24/20 0733 01/24/20 0740 01/24/20 0800  BP: (!) 189/75 (!) 143/54  (!) 145/56  Pulse: 79 73  73  Resp: (!) 29 (!) 27  (!) 27  Temp:   99.4 F (37.4 C)   TempSrc:   Oral   SpO2: 94% 96%  96%  Weight:      Height:        Intake/Output Summary (Last 24 hours) at 01/24/2020 1024 Last data filed at 01/24/2020 0800 Gross per 24 hour  Intake 474 ml  Output 2040 ml  Net -1566 ml    07/08 1901 - 07/10 0700 In: 574  Out: 2340 [Urine:25]  Filed Weights   01/23/20 0646 01/23/20 1045 01/24/20 0316  Weight: 58.8 kg 56.8 kg 52.3 kg    Physical Examination:  General-appears lethargic Heart-S1-S2, regular, no murmur auscultated Lungs-clear to auscultation bilaterally, no wheezing or crackles auscultated Abdomen-soft, nontender, no organomegaly Extremities-no edema in the lower extremities Neuro-alert, oriented x3, no focal deficit noted   Data Reviewed:   Recent Results (from the past 240 hour(s))  Culture, respiratory     Status: None   Collection Time: 01/14/20 12:46 PM   Specimen: Tracheal Aspirate; Respiratory  Result Value Ref Range  Status   Specimen Description TRACHEAL ASPIRATE  Final   Special Requests NONE  Final   Gram Stain   Final    NO WBC SEEN NO ORGANISMS SEEN Performed at [facility], [address], [place], [place] [address]    Culture FEW METHICILLIN RESISTANT STAPHYLOCOCCUS AUREUS  Final   Report Status 01/17/2020 FINAL  Final   Organism ID, Bacteria METHICILLIN RESISTANT STAPHYLOCOCCUS AUREUS  Final      Susceptibility   Methicillin resistant staphylococcus aureus - MIC*    CIPROFLOXACIN >=8 RESISTANT Resistant     ERYTHROMYCIN >=8 RESISTANT Resistant     GENTAMICIN <=0.5 SENSITIVE Sensitive     OXACILLIN >=4 RESISTANT Resistant     TETRACYCLINE <=1 SENSITIVE Sensitive     VANCOMYCIN 1 SENSITIVE Sensitive     TRIMETH/SULFA <=10 SENSITIVE Sensitive     CLINDAMYCIN <=0.25 SENSITIVE Sensitive     RIFAMPIN <=0.5 SENSITIVE Sensitive     Inducible Clindamycin NEGATIVE Sensitive     * FEW METHICILLIN RESISTANT STAPHYLOCOCCUS AUREUS  MRSA PCR Screening     Status: None   Collection Time: 01/18/20  8:56 AM   Specimen: Nasal Mucosa; Nasopharyngeal  Result Value Ref Range Status   MRSA by PCR NEGATIVE NEGATIVE Final    Comment:        The GeneXpert MRSA Assay (FDA approved for NASAL specimens only), is one component  of a comprehensive MRSA colonization surveillance program. It is not intended to diagnose MRSA infection nor to guide or monitor treatment for MRSA infections. Performed at [facility], [place] [address], [place], [place] [address]     No results for input(s): LIPASE, AMYLASE in the last 168 hours. No results for input(s): AMMONIA in the last 168 hours.  Cardiac Enzymes: No results for input(s): CKTOTAL, CKMB, CKMBINDEX, TROPONINI in the last 168 hours. BNP (last 3 results) Recent Labs    11/11/19 2226  BNP 93.9    Studies:  DG Abd Portable 1V  Result Date: 01/23/2020 CLINICAL DATA:  Vomiting. EXAM: PORTABLE ABDOMEN - 1 VIEW COMPARISON:  CT 01/09/2020.   12/31/2019. FINDINGS: Cardiac pacer lead tip noted over the right ventricle. Gastrostomy tube noted with tip over the stomach. No gastric distention. Mildly prominent air-filled loops of small and large bowel. Mild adynamic ileus cannot be excluded. Follow-up exam can be obtained to demonstrate resolution. No free air identified. Linear radiopacity noted over the right abdomen, this may be outside the patient. Clinical correlation suggested. Stable calcified splenic density. No acute bony abnormality. IMPRESSION: Gastrostomy tube noted with tip over the stomach. No gastric distention. 2. Mild prominence of small and large bowel suggesting adynamic ileus. Follow-up exam can be obtained. Electronically Signed   By: Marcello Moores  Register   On: 01/23/2020 12:43       [place]   [facility] If 7PM-7AM, please contact night-coverage at [email or web address], Office  [phone]   01/24/2020, 10:24 AM  LOS: 41 days

## 2020-01-25 LAB — RENAL FUNCTION PANEL
Albumin: 1.2 g/dL — ABNORMAL LOW (ref 3.5–5.0)
Anion gap: 12 (ref 5–15)
BUN: 67 mg/dL — ABNORMAL HIGH (ref 8–23)
CO2: 24 mmol/L (ref 22–32)
Calcium: 8.3 mg/dL — ABNORMAL LOW (ref 8.9–10.3)
Chloride: 93 mmol/L — ABNORMAL LOW (ref 98–111)
Creatinine, Ser: 3.87 mg/dL — ABNORMAL HIGH (ref 0.44–1.00)
GFR calc Af Amer: 13 mL/min — ABNORMAL LOW (ref >60)
GFR calc non Af Amer: 12 mL/min — ABNORMAL LOW (ref >60)
Glucose, Bld: 112 mg/dL — ABNORMAL HIGH (ref 70–99)
Phosphorus: 6.1 mg/dL — ABNORMAL HIGH (ref 2.5–4.6)
Potassium: 4.7 mmol/L (ref 3.5–5.1)
Sodium: 129 mmol/L — ABNORMAL LOW (ref 135–145)

## 2020-01-25 LAB — CBC WITH DIFFERENTIAL/PLATELET
Abs Immature Granulocytes: 0.1 10*3/uL — ABNORMAL HIGH (ref 0.00–0.07)
Basophils Absolute: 0.1 10*3/uL (ref 0.0–0.1)
Basophils Relative: 1 %
Eosinophils Absolute: 0.4 10*3/uL (ref 0.0–0.5)
Eosinophils Relative: 3 %
HCT: 26.3 % — ABNORMAL LOW (ref 36.0–46.0)
Hemoglobin: 8.1 g/dL — ABNORMAL LOW (ref 12.0–15.0)
Immature Granulocytes: 1 %
Lymphocytes Relative: 13 %
Lymphs Abs: 1.8 10*3/uL (ref 0.7–4.0)
MCH: 28.8 pg (ref 26.0–34.0)
MCHC: 30.8 g/dL (ref 30.0–36.0)
MCV: 93.6 fL (ref 80.0–100.0)
Monocytes Absolute: 1.4 10*3/uL — ABNORMAL HIGH (ref 0.1–1.0)
Monocytes Relative: 10 %
Neutro Abs: 10.3 10*3/uL — ABNORMAL HIGH (ref 1.7–7.7)
Neutrophils Relative %: 72 %
Platelets: 215 10*3/uL (ref 150–400)
RBC: 2.81 MIL/uL — ABNORMAL LOW (ref 3.87–5.11)
RDW: 18.6 % — ABNORMAL HIGH (ref 11.5–15.5)
WBC: 14 10*3/uL — ABNORMAL HIGH (ref 4.0–10.5)
nRBC: 0 % (ref 0.0–0.2)

## 2020-01-25 LAB — GLUCOSE, CAPILLARY
Glucose-Capillary: 126 mg/dL — ABNORMAL HIGH (ref 70–99)
Glucose-Capillary: 111 mg/dL — ABNORMAL HIGH (ref 70–99)
Glucose-Capillary: 130 mg/dL — ABNORMAL HIGH (ref 70–99)
Glucose-Capillary: 146 mg/dL — ABNORMAL HIGH (ref 70–99)
Glucose-Capillary: 108 mg/dL — ABNORMAL HIGH (ref 70–99)

## 2020-01-25 NOTE — Progress Notes (Signed)
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  On 12/13/2019, PCCM was consulted for tachypnea and enlarging left-sided pleural effusion, thoracentesis showed hemothorax.  CT of the chest showed obstruction of her left mainstem bronchus and loculated left pleural effusion.  She was admitted  to ICU under PCCM service on 11/17/2019 for chest tube placement and bronchoscopy.  She underwent intrapleural TPA but required VATS eventually.  Her course was complicated by ICH, seizures, AKI, requiring CRRT.  Patient subsequently had tracheostomy placed on 12/22/2019, NG tube and tunneled vascular catheter placed on 01/13/2020.  Subsequently patient was started on intermittent hemodialysis.  Significant Hospital events 4/16 IR thrombectomy R MCA stroke 5/6 transfer to [facility] 5/29 PCCM consult left pleural effusion, thoracentesis 5/30 worsening tachypnea. Moved to ICU for intubation to facilitate bronchoscopy and chest tube placement 5/31 TPA/ pulmonzyme started for 3 days for persistent left effusion 6/01 Afebrile, low-dose Levophed, Precedex, Minimal output on chest tube- pulmozyme / tpa x 2,  6/02 pulmozyme and TPA completed 3 days 6/03 afib with RVR placed on amio gtt overnight , 1U PRBC for Hb 7.1  6/05 extubated successfully, Repeat TPA/Pulmonzyme given again  6/06 acute decompensation overnight, hemorrhagic shock >to OR for left VATS with 2L hemothorax drained. CT head with new ICH>neurosurgery  consulted 6/09 More awake but agitated on PSV weaning  6/12: Seizures + on EEG 6/22 trach 6/29 g tube and tunneled vascular catheter placed 7/2: Care transferred to [facility]  Subjective   Patient seen and examined, tube feeds started yesterday.  Currently on Nepro at 45 mL/h.   Assessment/Plan:     1. Acute respiratory failure with hypoxia s/p tracheostomy-mucous plugging, suspected aspiration, left-sided loculated effusion complicated with hemothorax status post VATS decortication.  Patient has a long hospitalization and was intubated but subsequently underwent tracheostomy on 01/06/2020.  Patient is requiring mechanical ventilation intermittently.  Wean off as able. Chest x-ray done yesterday shows left-sided pleural effusion with mid and left lung base opacity. Also shows improved aeration in right lung fields. Will continue to monitor. 2. Acute kidney injury-oliguric ischemic ATN secondary to hemorrhagic shock.  Refractory to IV diuretics, started on CRRT from 12/26/2019 6 08/23/2019.  Subsequently started on hemodialysis from 01/05/2020.  Patient is status post right IJ tunneled dialysis catheter placement by IR on 01/13/2020.  Nephrology following and managing hemodialysis.  Renal function is slowly improving.  Today creatinine is 2.63.  Patient is a poor candidate for long-term hemodialysis in this current state. 3. History of right MCA stroke-s/p thrombectomy with residual left-sided paralysis s/p right hemicraniectomy complicated by ICH after intrapleural TPA. 4. Dysphagia-patient without new ICH on 12/20/2019.  Manage medically as per neurosurgery recommendations.  G-tube was placed on 01/13/2020 and feeding was started.  Tube feeding was held yesterday due to vomiting.  She was getting bolus tube feeding.  Continue tube feeds at 45 cc/h.    5. 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  EEG had shown status epilepticus.  Continue phenytoin via G-tube.  Continue Keppra.  As needed  Ativan. 6. Chronic atrial fibrillation-patient has history of  sick sinus syndrome s/p pacemaker placement.  Continue amiodarone, aspirin. 7. Protein calorie malnutrition-tube feed restarted at 45 cc/h.  Tolerating tube feeds well. 8. Diabetes mellitus type 2 with hyperglycemia and hypoglycemia episodes-continue sliding scale insulin with NovoLog. 9. Hyponatremia-patient has mild hyponatremia, will continue to monitor. 10. Anemia chronic disease-secondary to chronic illness hemoglobin has been stable.  Patient is received multiple PRBCs transfusions.  Will transfuse for hemoglobin 7.0. 11. Hypertension-blood pressure is controlled. 12. Overall poor prognosis.  Patient is very deconditioned and has poor prognosis.  Palliative care was consulted and has signed off.  Patient will need LTAC placement.    SpO2: 98 % O2 Flow Rate (L/min): 8 L/min FiO2 (%): 35 %     Lab Results  Component Value Date   SARSCOV2NAA NEGATIVE 10/31/2019     CBG: Recent Labs  Lab 01/24/20 2017 01/24/20 2343 01/25/20 0402 01/25/20 0755 01/25/20 1151  GLUCAP 99 148* 126* 111* 130*    CBC: Recent Labs  Lab 01/21/20 0445 01/22/20 0500 01/23/20 0507 01/24/20 0313 01/25/20 0238  WBC 12.4* 14.8* 13.6* 13.3* 14.0*  NEUTROABS 9.6* 12.5* 10.4* 10.1* 10.3*  HGB 7.9* 8.6* 8.0* 8.2* 8.1*  HCT 26.2* 28.9* 26.0* 27.0* 26.3*  MCV 96.0 96.3 93.9 95.4 93.6  PLT 137* 162 175 207 037    Basic Metabolic Panel: Recent Labs  Lab 01/20/20 0554 01/20/20 0554 01/21/20 0445 01/22/20 0500 01/23/20 0507 01/24/20 0313 01/25/20 0236  NA 130*   < > 130* 133* 132* 134* 129*  K 3.4*   < > 3.9 4.2 4.3 4.1 4.7  CL 93*   < > 92* 96* 95* 96* 93*  CO2 26   < > '26 27 25 28 24  ' GLUCOSE 134*   < > 168* 140* 98 111* 112*  BUN 48*   < > 72* 43* 75* 41* 67*  CREATININE 3.26*   < > 4.31* 2.87* 4.13* 2.63* 3.87*  CALCIUM 7.9*   < > 8.1* 8.4* 8.3* 8.1* 8.3*  MG 2.3  --   --   --   --   --   --   PHOS 3.5   < > 4.6 4.6 5.0* 4.1  6.1*   < > = values in this interval not displayed.     Liver Function Tests: Recent Labs  Lab 01/20/20 0554 01/20/20 0554 01/21/20 0445 01/22/20 0500 01/23/20 0507 01/24/20 0313 01/25/20 0236  AST 189*  --  145*  --   --   --   --   ALT 70*  --  66*  --   --   --   --   ALKPHOS 860*  --  840*  --   --   --   --   BILITOT 0.6  --  0.7  --   --   --   --   PROT 8.4*  --  8.6*  --   --   --   --   ALBUMIN 1.3*   < > 1.3* 1.3* 1.2* 1.3* 1.2*   < > = values in this interval not displayed.     DVT prophylaxis: SCDs  Code Status: Full code  Family Communication: No family at bedside    Status is: Inpatient  Dispo: The patient is from: Home              Anticipated d/c is to: LTAC              Anticipated d/c date is: 01/29/2020  Patient currently not medically stable for discharge  Barrier to discharge-requiring hemodialysis, mechanical ventilation     Scheduled medications:  . amantadine  100 mg Per Tube BID  . amiodarone  200 mg Per Tube Daily  . aspirin  325 mg Per Tube Daily  . atorvastatin  40 mg Per Tube Daily  . chlorhexidine gluconate (MEDLINE KIT)  15 mL Mouth Rinse BID  . Chlorhexidine Gluconate Cloth  6 each Topical Q0600  . darbepoetin (ARANESP) injection - DIALYSIS  100 mcg Intravenous Q Fri-HD  . docusate  100 mg Per Tube BID  . feeding supplement (PROSource TF)  45 mL Per Tube Daily  . free water  200 mL Per Tube Q6H  . Gerhardt's butt cream   Topical TID  . insulin aspart  0-20 Units Subcutaneous Q4H  . levETIRAcetam  750 mg Per Tube Q24H  . mouth rinse  15 mL Mouth Rinse 10 times per day  . multivitamin  1 tablet Per Tube QHS  . pantoprazole sodium  40 mg Per Tube QHS  . phenytoin  100 mg Per Tube Q8H  . polyethylene glycol  17 g Per Tube TID  . senna-docusate  1 tablet Per Tube BID  . sodium chloride flush  10-40 mL Intracatheter Q12H    Consultants:  PCCM  Palliative  care  Nephrology  Neurology  Neurosurgery  IR  Procedures: 12/13/2019 thoracentesis: 1.5 L bloody fluid Bronchoscopy on 11/28/2019 ETT: 11/20/2019-01/06/2020 Tracheostomy: 01/06/2020 onwards Left pigtail chest tube 12/01/2019-01/07/2020 [facility] HD cath from 12/27/2019-01/13/2020 G-tube and tunneled vascular catheter placement on 01/13/2020  Overnight EEG on 12/27/2019: Right frontocentral seizures x2   Antibiotics:   Anti-infectives (From admission, onward)   Start     Dose/Rate Route Frequency Ordered Stop   01/15/20 0745  ceFAZolin (ANCEF) IVPB 2g/100 mL premix  Status:  Discontinued        2 g 200 mL/hr over 30 Minutes Intravenous  Once 01/15/20 0739 01/18/20 0938   01/13/20 1645  ceFAZolin (ANCEF) IVPB 2g/100 mL premix        2 g 200 mL/hr over 30 Minutes Intravenous To Radiology 01/13/20 1630 01/13/20 1640   01/13/20 1630  ceFAZolin (ANCEF) IVPB 2g/100 mL premix  Status:  Discontinued        2 g 200 mL/hr over 30 Minutes Intravenous To ShortStay Surgical 01/13/20 1625 01/13/20 1906   01/13/20 0800  ceFAZolin (ANCEF) IVPB 2g/100 mL premix        2 g 200 mL/hr over 30 Minutes Intravenous To Radiology 01/12/20 1007 01/13/20 1836   01/12/20 1030  ceFAZolin (ANCEF) IVPB 2g/100 mL premix  Status:  Discontinued        2 g 200 mL/hr over 30 Minutes Intravenous To Radiology 01/12/20 0927 01/12/20 1007   12/27/19 1000  ceFEPIme (MAXIPIME) 2 g in sodium chloride 0.9 % 100 mL IVPB  Status:  Discontinued        2 g 200 mL/hr over 30 Minutes Intravenous Every 12 hours 12/26/19 1412 12/27/19 1017   12/26/19 1000  ceFEPIme (MAXIPIME) 1 g in sodium chloride 0.9 % 100 mL IVPB  Status:  Discontinued        1 g 200 mL/hr over 30 Minutes Intravenous Every 24 hours 12/25/19 1152 12/26/19 1412   01/13/2020 1244  ceFAZolin (ANCEF) IVPB 2g/100 mL premix  Status:  Discontinued        2 g 200 mL/hr over 30 Minutes Intravenous 30 min pre-op 01/14/2020 1244 12/19/2019 1823   12/16/2019  0600  ceFEPIme (MAXIPIME)  1 g in sodium chloride 0.9 % 100 mL IVPB  Status:  Discontinued        1 g 200 mL/hr over 30 Minutes Intravenous Every 8 hours 12/29/2019 0337 12/24/2019 0340   01/14/2020 0400  vancomycin (VANCOCIN) IVPB 1000 mg/200 mL premix        1,000 mg 200 mL/hr over 60 Minutes Intravenous  Once 01/12/2020 0337 12/31/2019 0653   12/27/2019 0400  ceFEPIme (MAXIPIME) 2 g in sodium chloride 0.9 % 100 mL IVPB  Status:  Discontinued        2 g 200 mL/hr over 30 Minutes Intravenous Every 24 hours 01/11/2020 0340 12/25/19 1152   12/20/19 2200  amoxicillin-clavulanate (AUGMENTIN) 500-125 MG per tablet 500 mg  Status:  Discontinued        1 tablet Oral 2 times daily 12/20/19 1412 12/16/2019 0337   12/20/19 1415  amoxicillin-clavulanate (AUGMENTIN) 875-125 MG per tablet 1 tablet  Status:  Discontinued        1 tablet Oral Every 12 hours 12/20/19 1407 12/20/19 1411   12/06/2019 1245  piperacillin-tazobactam (ZOSYN) IVPB 3.375 g  Status:  Discontinued        3.375 g 12.5 mL/hr over 240 Minutes Intravenous Every 8 hours 12/10/2019 1235 12/20/19 1407       Objective   Vitals:   01/25/20 0700 01/25/20 0804 01/25/20 1127 01/25/20 1200  BP: (!) 155/58  (!) 144/62 (!) 140/55  Pulse: 67 68 64 62  Resp: (!) 29 (!) 23 (!) 30 (!) 24  Temp: 98.5 F (36.9 C)  98.9 F (37.2 C)   TempSrc: Oral     SpO2: 96% 100% 94% 98%  Weight:      Height:       No intake or output data in the 24 hours ending 01/25/20 1333  07/09 1901 - 07/11 0700 In: 474  Out: 50 [Urine:25]  Filed Weights   01/23/20 1045 01/24/20 0316 01/25/20 0408  Weight: 56.8 kg 52.3 kg 56.4 kg    Physical Examination:  General-lethargic, opens eyes to verbal stimuli Heart-S1-S2, regular, no murmur auscultated Lungs-clear to auscultation bilaterally, no wheezing or crackles auscultated Abdomen-soft, nontender, no organomegaly Extremities-no edema in the lower extremities Neuro-lethargic, opens eyes to verbal stimuli.   Data Reviewed:   Recent Results (from  the past 240 hour(s))  MRSA PCR Screening     Status: None   Collection Time: 01/18/20  8:56 AM   Specimen: Nasal Mucosa; Nasopharyngeal  Result Value Ref Range Status   MRSA by PCR NEGATIVE NEGATIVE Final    Comment:        The GeneXpert MRSA Assay (FDA approved for NASAL specimens only), is one component of a comprehensive MRSA colonization surveillance program. It is not intended to diagnose MRSA infection nor to guide or monitor treatment for MRSA infections. Performed at [facility], [address] [place], [place] [address]     No results for input(s): LIPASE, AMYLASE in the last 168 hours. No results for input(s): AMMONIA in the last 168 hours.  Cardiac Enzymes: No results for input(s): CKTOTAL, CKMB, CKMBINDEX, TROPONINI in the last 168 hours. BNP (last 3 results) Recent Labs    11/11/19 2226  BNP 93.9    Studies:  No results found.     Oswald Hillock   [facility] If 7PM-7AM, please contact night-coverage at [email or web address], Office  [phone]   01/25/2020, 1:33 PM  LOS: 42 days

## 2020-01-25 NOTE — Progress Notes (Signed)
Patient ID: Krystal Little, female   DOB: Feb 22, 1956, 64 y.o.   MRN: [id number] S:No events overnight.  TF's restarted. O:BP (!) 155/58   Pulse 68   Temp 98.5 F (36.9 C) (Oral)   Resp (!) 23   Ht _0  (1.626 m)   Wt 56.4 kg   SpO2 100%   BMI 21.34 kg/m  No intake or output data in the 24 hours ending 01/25/20 1032 Intake/Output: I/O last 3 completed shifts: In: 474 [NG/GT:474] Out: 50 [Urine:25; Stool:25]  Intake/Output this shift:  No intake/output data recorded. Weight change: -0.4 kg Gen: NAD CVS: RRR, no rub Resp: occ rhonchi Abd: benign Ext: trace presacral edema  Recent Labs  Lab 01/19/20 0319 01/20/20 0554 01/21/20 0445 01/22/20 0500 01/23/20 0507 01/24/20 0313 01/25/20 0236  NA 132* 130* 130* 133* 132* 134* 129*  K 4.4 3.4* 3.9 4.2 4.3 4.1 4.7  CL 95* 93* 92* 96* 95* 96* 93*  CO2 _1 GLUCOSE 135* 134* 168* 140* 98 111* 112*  BUN 81* 48* 72* 43* 75* 41* 67*  CREATININE 4.92* 3.26* 4.31* 2.87* 4.13* 2.63* 3.87*  ALBUMIN 1.2* 1.3* 1.3* 1.3* 1.2* 1.3* 1.2*  CALCIUM 7.9* 7.9* 8.1* 8.4* 8.3* 8.1* 8.3*  PHOS 6.1* 3.5 4.6 4.6 5.0* 4.1 6.1*  AST  --  189* 145*  --   --   --   --   ALT  --  70* 66*  --   --   --   --    Liver Function Tests: Recent Labs  Lab 01/20/20 0554 01/20/20 0554 01/21/20 0445 01/22/20 0500 01/23/20 0507 01/24/20 0313 01/25/20 0236  AST 189*  --  145*  --   --   --   --   ALT 70*  --  66*  --   --   --   --   ALKPHOS 860*  --  840*  --   --   --   --   BILITOT 0.6  --  0.7  --   --   --   --   PROT 8.4*  --  8.6*  --   --   --   --   ALBUMIN 1.3*   < > 1.3*   < > 1.2* 1.3* 1.2*   < > = values in this interval not displayed.   No results for input(s): LIPASE, AMYLASE in the last 168 hours. No results for input(s): AMMONIA in the last 168 hours. CBC: Recent Labs  Lab 01/21/20 0445 01/21/20 0445 01/22/20 0500 01/22/20 0500 01/23/20 0507 01/24/20 0313 01/25/20 0238  WBC 12.4*   < > 14.8*   < > 13.6*  13.3* 14.0*  NEUTROABS 9.6*   < > 12.5*   < > 10.4* 10.1* 10.3*  HGB 7.9*   < > 8.6*   < > 8.0* 8.2* 8.1*  HCT 26.2*   < > 28.9*   < > 26.0* 27.0* 26.3*  MCV 96.0  --  96.3  --  93.9 95.4 93.6  PLT 137*   < > 162   < > 175 207 215   < > = values in this interval not displayed.   Cardiac Enzymes: No results for input(s): CKTOTAL, CKMB, CKMBINDEX, TROPONINI in the last 168 hours. CBG: Recent Labs  Lab 01/24/20 1617 01/24/20 2017 01/24/20 2343 01/25/20 0402 01/25/20 0755  GLUCAP 138* 99 148* 126* 111*    Iron Studies: No results for input(s): IRON, TIBC,  TRANSFERRIN, FERRITIN in the last 72 hours. Studies/Results: DG Abd Portable 1V  Result Date: 01/23/2020 CLINICAL DATA:  Vomiting. EXAM: PORTABLE ABDOMEN - 1 VIEW COMPARISON:  CT 01/09/2020.  12/31/2019. FINDINGS: Cardiac pacer lead tip noted over the right ventricle. Gastrostomy tube noted with tip over the stomach. No gastric distention. Mildly prominent air-filled loops of small and large bowel. Mild adynamic ileus cannot be excluded. Follow-up exam can be obtained to demonstrate resolution. No free air identified. Linear radiopacity noted over the right abdomen, this may be outside the patient. Clinical correlation suggested. Stable calcified splenic density. No acute bony abnormality. IMPRESSION: Gastrostomy tube noted with tip over the stomach. No gastric distention. 2. Mild prominence of small and large bowel suggesting adynamic ileus. Follow-up exam can be obtained. Electronically Signed   By: Marcello Moores  Register   On: 01/23/2020 12:43   . amantadine  100 mg Per Tube BID  . amiodarone  200 mg Per Tube Daily  . aspirin  325 mg Per Tube Daily  . atorvastatin  40 mg Per Tube Daily  . chlorhexidine gluconate (MEDLINE KIT)  15 mL Mouth Rinse BID  . Chlorhexidine Gluconate Cloth  6 each Topical Q0600  . darbepoetin (ARANESP) injection - DIALYSIS  100 mcg Intravenous Q Fri-HD  . docusate  100 mg Per Tube BID  . feeding supplement  (PROSource TF)  45 mL Per Tube Daily  . free water  200 mL Per Tube Q6H  . Gerhardt's butt cream   Topical TID  . insulin aspart  0-20 Units Subcutaneous Q4H  . levETIRAcetam  750 mg Per Tube Q24H  . mouth rinse  15 mL Mouth Rinse 10 times per day  . multivitamin  1 tablet Per Tube QHS  . pantoprazole sodium  40 mg Per Tube QHS  . phenytoin  100 mg Per Tube Q8H  . polyethylene glycol  17 g Per Tube TID  . senna-docusate  1 tablet Per Tube BID  . sodium chloride flush  10-40 mL Intracatheter Q12H    BMET    Component Value Date/Time   NA 129 (L) 01/25/2020 0236   NA 140 08/18/2019 1120   K 4.7 01/25/2020 0236   CL 93 (L) 01/25/2020 0236   CO2 24 01/25/2020 0236   GLUCOSE 112 (H) 01/25/2020 0236   BUN 67 (H) 01/25/2020 0236   BUN 12 08/18/2019 1120   CREATININE 3.87 (H) 01/25/2020 0236   CREATININE 0.73 05/18/2016 0919   CALCIUM 8.3 (L) 01/25/2020 0236   GFRNONAA 12 (L) 01/25/2020 0236   GFRNONAA 89 05/18/2016 0919   GFRAA 13 (L) 01/25/2020 0236   GFRAA >89 05/18/2016 0919   CBC    Component Value Date/Time   WBC 14.0 (H) 01/25/2020 0238   RBC 2.81 (L) 01/25/2020 0238   HGB 8.1 (L) 01/25/2020 0238   HGB 15.2 08/18/2019 1120   HCT 26.3 (L) 01/25/2020 0238   HCT 45.9 08/18/2019 1120   PLT 215 01/25/2020 0238   PLT 168 08/18/2019 1120   MCV 93.6 01/25/2020 0238   MCV 94 08/18/2019 1120   MCH 28.8 01/25/2020 0238   MCHC 30.8 01/25/2020 0238   RDW 18.6 (H) 01/25/2020 0238   RDW 12.2 08/18/2019 1120   LYMPHSABS 1.8 01/25/2020 0238   MONOABS 1.4 (H) 01/25/2020 0238   EOSABS 0.4 01/25/2020 0238   BASOSABS 0.1 01/25/2020 0238    Assessment/Plan:  1. AKI- due to ischemic ATN in setting of hemorrhagic shock requiring pressor support and initiated on CRRT  6/11-6/20/21 and transitioned to IHD on 6/21 and has remained oliguric and dialysis dependent. 1. Will continue with HD on MWF schedule for now. 2. Has been tolerating HD 3. No signs of renal recovery at this  time. 4. Will increase uf goal tomorrow due to increase in weight after resuming TF's and drop in serum sodium. 2. Left sided hemothorax- s/p VATS on 6/6 and s/p removal of chest tubes 3. RMCA stroke- s/p thrombectomy 4/16 c/b worsening cerebral edema with mass effect s/p decompressive right hemicraniectomy and placement of bone flap on 11/02/19, new ICH 12/25/2019. 4. 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 Chronic atrial fibrillation- on amio and asa 6. HTN- stable 7. Seizure disorder- per neuro 8. Nutrition- s/p PEG by IR on 6/29 9. ABLA/Anemia of critical illness- s/p multiple transfusions 10. Disposition- will need LTAC but poor overall prognosis. Continue with ongoing discussions with family regarding poor prognosis for meaningful recovery.  Donetta Potts, MD [employer] [phone]

## 2020-01-26 ENCOUNTER — Inpatient Hospital Stay (HOSPITAL_COMMUNITY): Payer: Medicare Other

## 2020-01-26 LAB — CBC WITH DIFFERENTIAL/PLATELET
Abs Immature Granulocytes: 0.08 10*3/uL — ABNORMAL HIGH (ref 0.00–0.07)
Basophils Absolute: 0.1 10*3/uL (ref 0.0–0.1)
Basophils Relative: 0 %
Eosinophils Absolute: 0.4 10*3/uL (ref 0.0–0.5)
Eosinophils Relative: 3 %
HCT: 25.9 % — ABNORMAL LOW (ref 36.0–46.0)
Hemoglobin: 8.2 g/dL — ABNORMAL LOW (ref 12.0–15.0)
Immature Granulocytes: 1 %
Lymphocytes Relative: 12 %
Lymphs Abs: 1.7 10*3/uL (ref 0.7–4.0)
MCH: 29.3 pg (ref 26.0–34.0)
MCHC: 31.7 g/dL (ref 30.0–36.0)
MCV: 92.5 fL (ref 80.0–100.0)
Monocytes Absolute: 1.3 10*3/uL — ABNORMAL HIGH (ref 0.1–1.0)
Monocytes Relative: 9 %
Neutro Abs: 11.1 10*3/uL — ABNORMAL HIGH (ref 1.7–7.7)
Neutrophils Relative %: 75 %
Platelets: 205 10*3/uL (ref 150–400)
RBC: 2.8 MIL/uL — ABNORMAL LOW (ref 3.87–5.11)
RDW: 18.5 % — ABNORMAL HIGH (ref 11.5–15.5)
WBC: 14.6 10*3/uL — ABNORMAL HIGH (ref 4.0–10.5)
nRBC: 0 % (ref 0.0–0.2)

## 2020-01-26 LAB — GLUCOSE, CAPILLARY
Glucose-Capillary: 107 mg/dL — ABNORMAL HIGH (ref 70–99)
Glucose-Capillary: 116 mg/dL — ABNORMAL HIGH (ref 70–99)
Glucose-Capillary: 116 mg/dL — ABNORMAL HIGH (ref 70–99)
Glucose-Capillary: 125 mg/dL — ABNORMAL HIGH (ref 70–99)
Glucose-Capillary: 140 mg/dL — ABNORMAL HIGH (ref 70–99)
Glucose-Capillary: 158 mg/dL — ABNORMAL HIGH (ref 70–99)
Glucose-Capillary: 177 mg/dL — ABNORMAL HIGH (ref 70–99)
Glucose-Capillary: 74 mg/dL (ref 70–99)

## 2020-01-26 LAB — RENAL FUNCTION PANEL
Albumin: 1.3 g/dL — ABNORMAL LOW (ref 3.5–5.0)
Anion gap: 15 (ref 5–15)
BUN: 94 mg/dL — ABNORMAL HIGH (ref 8–23)
CO2: 22 mmol/L (ref 22–32)
Calcium: 8.1 mg/dL — ABNORMAL LOW (ref 8.9–10.3)
Chloride: 91 mmol/L — ABNORMAL LOW (ref 98–111)
Creatinine, Ser: 4.88 mg/dL — ABNORMAL HIGH (ref 0.44–1.00)
GFR calc Af Amer: 10 mL/min — ABNORMAL LOW (ref >60)
GFR calc non Af Amer: 9 mL/min — ABNORMAL LOW (ref >60)
Glucose, Bld: 110 mg/dL — ABNORMAL HIGH (ref 70–99)
Phosphorus: 7 mg/dL — ABNORMAL HIGH (ref 2.5–4.6)
Potassium: 4.7 mmol/L (ref 3.5–5.1)
Sodium: 128 mmol/L — ABNORMAL LOW (ref 135–145)

## 2020-01-26 MED ORDER — GUAIFENESIN ER 600 MG PO TB12
600.0000 mg | ORAL_TABLET | Freq: Two times a day (BID) | ORAL | Status: DC
  Administered 2020-01-26: 600 mg via ORAL
  Filled 2020-01-26 (×2): qty 1

## 2020-01-26 MED ORDER — HEPARIN SODIUM (PORCINE) 1000 UNIT/ML IJ SOLN
INTRAMUSCULAR | Status: AC
  Administered 2020-01-26: 3200 [IU] via INTRAVENOUS_CENTRAL
  Filled 2020-01-26: qty 3

## 2020-01-26 NOTE — Progress Notes (Signed)
[facility] Progress Note Patient Name: Krystal Little DOB: 1956-02-15 MRN: [id number]   Date of Service  01/26/2020  HPI/Events of Note  Mucous and blood plugging of the airway distal to the tracheostomy. Pt with drop in saturation into the 80's.  eICU Interventions  Sterile saline instilled followed by bagging and suctioning with improvement in saturation into the 90's.        Frederik Pear 01/26/2020, 6:43 AM

## 2020-01-26 NOTE — Progress Notes (Signed)
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  On 12/13/2019, PCCM was consulted for tachypnea and enlarging left-sided pleural effusion, thoracentesis showed hemothorax.  CT of the chest showed obstruction of her left mainstem bronchus and loculated left pleural effusion.  She was admitted  to ICU under PCCM service on 11/30/2019 for chest tube placement and bronchoscopy.  She underwent intrapleural TPA but required VATS eventually.  Her course was complicated by ICH, seizures, AKI, requiring CRRT.  Patient subsequently had tracheostomy placed on 12/22/2019, NG tube and tunneled vascular catheter placed on 01/13/2020.  Subsequently patient was started on intermittent hemodialysis.  Significant Hospital events 4/16 IR thrombectomy R MCA stroke 5/6 transfer to [facility] Inpatient Rehab 5/29 PCCM consult left pleural effusion, thoracentesis 5/30 worsening tachypnea. Moved to ICU for intubation to facilitate bronchoscopy and chest tube placement 5/31 TPA/ pulmonzyme started for 3 days for persistent left effusion 6/01 Afebrile, low-dose Levophed, Precedex, Minimal output on chest tube- pulmozyme / tpa x 2,  6/02 pulmozyme and TPA completed 3 days 6/03 afib with RVR placed on amio gtt overnight , 1U PRBC for Hb 7.1  6/05 extubated successfully, Repeat TPA/Pulmonzyme given again  6/06 acute decompensation overnight, hemorrhagic shock >to OR for left VATS with 2L hemothorax drained. CT head with new ICH>neurosurgery  consulted 6/09 More awake but agitated on PSV weaning  6/12: Seizures + on EEG 6/22 trach 6/29 g tube and tunneled vascular catheter placed 7/2: Care transferred to [facility]  Subjective   Patient seen and examined, patient's O2 sat dropped to 80s had a mucus and blood plugging of the airway distal to tracheostomy.  Required bagging and suctioning with improvement in O2 sats in 90s.  Appears comfortable at this time.  Denies any complaints.   Assessment/Plan:     1. Acute respiratory failure with hypoxia s/p tracheostomy-mucous plugging, suspected aspiration, left-sided loculated effusion complicated with hemothorax status post VATS decortication.  Patient has a long hospitalization and was intubated but subsequently underwent tracheostomy on 01/06/2020.  Patient is requiring mechanical ventilation intermittently.  Wean off as able. Chest x-ray done yesterday shows left-sided pleural effusion with mid and left lung base opacity. Also shows improved aeration in right lung fields. Will continue to monitor. 2. Acute kidney injury-oliguric ischemic ATN secondary to hemorrhagic shock.  Refractory to IV diuretics, started on CRRT from 12/26/2019 6 08/23/2019.  Subsequently started on hemodialysis from 01/05/2020.  Patient is status post right IJ tunneled dialysis catheter placement by IR on 01/13/2020.  Nephrology following and managing hemodialysis.  Renal function is slowly improving.  Today creatinine is 2.63.  Patient is a poor candidate for long-term hemodialysis in this current state. 3. History of right MCA stroke-s/p thrombectomy with residual left-sided paralysis s/p right hemicraniectomy complicated by ICH after intrapleural TPA. 4. Dysphagia-patient without new ICH on 12/16/2019.  Manage medically as per neurosurgery recommendations.  G-tube was placed on 01/13/2020 and feeding was started.  Tube feeding was held yesterday due to vomiting.  She was getting bolus tube feeding.  Continue tube feeds at 45  cc/h.    5. 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  EEG had shown status epilepticus.  Continue phenytoin via G-tube.  Continue Keppra.  As needed Ativan. 6. Chronic atrial fibrillation-patient has history of sick sinus syndrome s/p pacemaker placement.  Continue amiodarone, aspirin. 7. Protein calorie malnutrition-tube feed restarted at 45 cc/h.  Tolerating tube feeds well. 8. Diabetes mellitus type 2 with hyperglycemia and hypoglycemia episodes-continue sliding scale insulin with NovoLog. 9. Hyponatremia-patient has mild hyponatremia, will hold free water.  Nephrology following. 10. Anemia chronic disease-secondary to chronic illness hemoglobin has been stable.  Patient  received multiple PRBCs transfusions.  Will transfuse for hemoglobin 7.0. 11. Hypertension-blood pressure is controlled. 12. Overall poor prognosis.  Patient is very deconditioned and has poor prognosis.  Palliative care was consulted and has signed off.  Patient will need LTAC placement.    SpO2: 94 % (Simultaneous filing. User may not have seen previous data.) O2 Flow Rate (L/min): 10 L/min FiO2 (%): 40 %     Lab Results  Component Value Date   [place] NEGATIVE 10/31/2019     CBG: Recent Labs  Lab 01/25/20 1925 01/25/20 2329 01/26/20 0317 01/26/20 0757 01/26/20 1225  GLUCAP 108* 158* 125* 140* 107*    CBC: Recent Labs  Lab 01/22/20 0500 01/23/20 0507 01/24/20 0313 01/25/20 0238 01/26/20 0303  WBC 14.8* 13.6* 13.3* 14.0* 14.6*  NEUTROABS 12.5* 10.4* 10.1* 10.3* 11.1*  HGB 8.6* 8.0* 8.2* 8.1* 8.2*  HCT 28.9* 26.0* 27.0* 26.3* 25.9*  MCV 96.3 93.9 95.4 93.6 92.5  PLT 162 175 207 215 478    Basic Metabolic Panel: Recent Labs  Lab 01/20/20 0554 01/21/20 0445 01/22/20 0500 01/23/20 0507 01/24/20 0313 01/25/20 0236 01/26/20 0303  NA 130*   < > 133* 132* 134* 129* 128*  K 3.4*   < > 4.2 4.3 4.1 4.7 4.7  CL 93*   < > 96* 95* 96* 93* 91*  CO2 26   < > _0 GLUCOSE 134*   < > 140* 98 111* 112* 110*  BUN 48*   < > 43* 75* 41* 67* 94*  CREATININE 3.26*   < > 2.87* 4.13* 2.63* 3.87* 4.88*  CALCIUM 7.9*   < > 8.4* 8.3* 8.1* 8.3* 8.1*  MG 2.3  --   --   --   --   --   --   PHOS 3.5   < > 4.6 5.0* 4.1 6.1* 7.0*   < > = values in this interval not displayed.     Liver Function Tests: Recent Labs  Lab 01/20/20 0554 01/20/20 0554 01/21/20 0445 01/21/20 0445 01/22/20 0500 01/23/20 0507 01/24/20 0313 01/25/20 0236 01/26/20 0303  AST 189*  --  145*  --   --   --   --   --   --   ALT 70*  --  66*  --   --   --   --   --   --   ALKPHOS 860*  --  840*  --   --   --   --   --   --   BILITOT 0.6  --  0.7  --   --   --   --   --   --   PROT 8.4*  --  8.6*  --   --   --   --   --   --   ALBUMIN 1.3*   < > 1.3*   < > 1.3* 1.2* 1.3* 1.2* 1.3*   < > =  values in this interval not displayed.     DVT prophylaxis: SCDs  Code Status: Full code  Family Communication: No family at bedside    Status is: Inpatient  Dispo: The patient is from: Home              Anticipated d/c is to: LTAC              Anticipated d/c date is: 01/29/2020              Patient currently not medically stable for discharge  Barrier to discharge-requiring hemodialysis, mechanical ventilation     Scheduled medications:  . amantadine  100 mg Per Tube BID  . amiodarone  200 mg Per Tube Daily  . aspirin  325 mg Per Tube Daily  . atorvastatin  40 mg Per Tube Daily  . chlorhexidine gluconate (MEDLINE KIT)  15 mL Mouth Rinse BID  . Chlorhexidine Gluconate Cloth  6 each Topical Q0600  . darbepoetin (ARANESP) injection - DIALYSIS  100 mcg Intravenous Q Fri-HD  . docusate  100 mg Per Tube BID  . feeding supplement (PROSource TF)  45 mL Per Tube Daily  . Gerhardt's butt cream   Topical TID  . guaiFENesin  600 mg Oral BID  . heparin sodium (porcine)      . insulin aspart  0-20 Units Subcutaneous Q4H  . levETIRAcetam  750 mg Per Tube Q24H  . mouth rinse  15 mL Mouth  Rinse 10 times per day  . multivitamin  1 tablet Per Tube QHS  . pantoprazole sodium  40 mg Per Tube QHS  . phenytoin  100 mg Per Tube Q8H  . polyethylene glycol  17 g Per Tube TID  . senna-docusate  1 tablet Per Tube BID  . sodium chloride flush  10-40 mL Intracatheter Q12H    Consultants:  PCCM  Palliative care  Nephrology  Neurology  Neurosurgery  IR  Procedures: 12/13/2019 thoracentesis: 1.5 L bloody fluid Bronchoscopy on 11/16/2019 ETT: 12/15/2019-01/06/2020 Tracheostomy: 01/06/2020 onwards Left pigtail chest tube 11/16/2019-01/02/2020 [facility] HD cath from 12/27/2019-01/13/2020 G-tube and tunneled vascular catheter placement on 01/13/2020  Overnight EEG on 12/27/2019: Right frontocentral seizures x2   Antibiotics:   Anti-infectives (From admission, onward)   Start     Dose/Rate Route Frequency Ordered Stop   01/15/20 0745  ceFAZolin (ANCEF) IVPB 2g/100 mL premix  Status:  Discontinued        2 g 200 mL/hr over 30 Minutes Intravenous  Once 01/15/20 0739 01/18/20 0938   01/13/20 1645  ceFAZolin (ANCEF) IVPB 2g/100 mL premix        2 g 200 mL/hr over 30 Minutes Intravenous To Radiology 01/13/20 1630 01/13/20 1640   01/13/20 1630  ceFAZolin (ANCEF) IVPB 2g/100 mL premix  Status:  Discontinued        2 g 200 mL/hr over 30 Minutes Intravenous To ShortStay Surgical 01/13/20 1625 01/13/20 1906   01/13/20 0800  ceFAZolin (ANCEF) IVPB 2g/100 mL premix        2 g 200 mL/hr over 30 Minutes Intravenous To Radiology 01/12/20 1007 01/13/20 1836   01/12/20 1030  ceFAZolin (ANCEF) IVPB 2g/100 mL premix  Status:  Discontinued        2 g 200 mL/hr over 30 Minutes Intravenous To Radiology 01/12/20 0927 01/12/20 1007   12/27/19 1000  ceFEPIme (MAXIPIME) 2 g in sodium chloride 0.9 % 100 mL IVPB  Status:  Discontinued        2 g 200 mL/hr  over 30 Minutes Intravenous Every 12 hours 12/26/19 1412 12/27/19 1017   12/26/19 1000  ceFEPIme (MAXIPIME) 1 g in sodium chloride 0.9 % 100 mL IVPB   Status:  Discontinued        1 g 200 mL/hr over 30 Minutes Intravenous Every 24 hours 12/25/19 1152 12/26/19 1412   01/01/2020 1244  ceFAZolin (ANCEF) IVPB 2g/100 mL premix  Status:  Discontinued        2 g 200 mL/hr over 30 Minutes Intravenous 30 min pre-op 12/17/2019 1244 12/17/2019 1823   12/23/2019 0600  ceFEPIme (MAXIPIME) 1 g in sodium chloride 0.9 % 100 mL IVPB  Status:  Discontinued        1 g 200 mL/hr over 30 Minutes Intravenous Every 8 hours 12/29/2019 0337 12/28/2019 0340   12/30/2019 0400  vancomycin (VANCOCIN) IVPB 1000 mg/200 mL premix        1,000 mg 200 mL/hr over 60 Minutes Intravenous  Once 01/11/2020 0337 12/31/2019 0653   12/31/2019 0400  ceFEPIme (MAXIPIME) 2 g in sodium chloride 0.9 % 100 mL IVPB  Status:  Discontinued        2 g 200 mL/hr over 30 Minutes Intravenous Every 24 hours 12/28/2019 0340 12/25/19 1152   12/20/19 2200  amoxicillin-clavulanate (AUGMENTIN) 500-125 MG per tablet 500 mg  Status:  Discontinued        1 tablet Oral 2 times daily 12/20/19 1412 01/04/2020 0337   12/20/19 1415  amoxicillin-clavulanate (AUGMENTIN) 875-125 MG per tablet 1 tablet  Status:  Discontinued        1 tablet Oral Every 12 hours 12/20/19 1407 12/20/19 1411   12/02/2019 1245  piperacillin-tazobactam (ZOSYN) IVPB 3.375 g  Status:  Discontinued        3.375 g 12.5 mL/hr over 240 Minutes Intravenous Every 8 hours 12/07/2019 1235 12/20/19 1407       Objective   Vitals:   01/26/20 1440 01/26/20 1445 01/26/20 1500 01/26/20 1515  BP: 139/63 (!) 155/61 109/71 130/65  Pulse: 67 62 66 70  Resp: (!) 44 (!) 24 (!) 22 (!) 28  Temp:      TempSrc:      SpO2: 97% 94% 96% 94%  Weight:      Height:        Intake/Output Summary (Last 24 hours) at 01/26/2020 1529 Last data filed at 01/26/2020 1500 Gross per 24 hour  Intake 1080 ml  Output --  Net 1080 ml    07/10 1901 - 07/12 0700 In: 1565  Out: -   Filed Weights   01/24/20 0316 01/25/20 0408 01/26/20 0500  Weight: 52.3 kg 56.4 kg 56.9 kg     Physical Examination:  General-appears in no acute distress Heart-S1-S2, regular, no murmur auscultated Lungs-clear to auscultation bilaterally, no wheezing or crackles auscultated Abdomen-soft, nontender, no organomegaly Extremities-no edema in the lower extremities Neuro-alert  Data Reviewed:   Recent Results (from the past 240 hour(s))  MRSA PCR Screening     Status: None   Collection Time: 01/18/20  8:56 AM   Specimen: Nasal Mucosa; Nasopharyngeal  Result Value Ref Range Status   MRSA by PCR NEGATIVE NEGATIVE Final    Comment:        The GeneXpert MRSA Assay (FDA approved for NASAL specimens only), is one component of a comprehensive MRSA colonization surveillance program. It is not intended to diagnose MRSA infection nor to guide or monitor treatment for MRSA infections. Performed at [facility], [place] [address] [place], [place]  [address]     No results for input(s): LIPASE, AMYLASE in the last 168 hours. No results for input(s): AMMONIA in the last 168 hours.  Cardiac Enzymes: No results for input(s): CKTOTAL, CKMB, CKMBINDEX, TROPONINI in the last 168 hours. BNP (last 3 results) Recent Labs    11/11/19 2226  BNP 93.9    Studies:  DG CHEST PORT 1 VIEW  Result Date: 01/26/2020 CLINICAL DATA:  Hypoxia EXAM: PORTABLE CHEST 1 VIEW COMPARISON:  January 21, 2020 FINDINGS: Tracheostomy catheter tip is 4.8 cm above the carina. Dual lumen central catheter has its tip in the superior vena cava near the cavoatrial junction. Left peripherally inserted central catheter has its tip in the superior vena cava. Pacemaker lead attached to right ventricle. Skin fold noted on the left. No pneumothorax. There is a moderate pleural effusion on the left, partially loculated, with atelectatic change in the left base. Hazy opacity left upper lobe is stable and likely represents loculated effusion. Right lung is clear. Heart is mildly enlarged with pulmonary vascularity normal.  Patient is status post median sternotomy. IMPRESSION: Tube and catheter positions as described without pneumothorax. Loculated pleural effusion on the left with left base atelectasis. Opacity left upper lobe likely represents another focus of loculated fluid. Mild airspace opacity superimposed in this area is possible. Right lung is clear. Stable cardiac silhouette. Electronically Signed   By: Lowella Grip III M.D.   On: 01/26/2020 08:51       [place]   [facility] If 7PM-7AM, please contact night-coverage at [email or web address], Office  [phone]   01/26/2020, 3:29 PM  LOS: 43 days

## 2020-01-26 NOTE — Progress Notes (Addendum)
Physical Therapy Treatment Patient Details Name: Krystal Little MRN: [id number] DOB: [birth year] Today's Date: 01/26/2020    History of Present Illness Pt is 64 y.o. female initially admitted for stroke. 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 Pt s/p R hemicraniectomy with bone flap placed in abdomen on 4/18.  PMH HTN, DM2, chronic a. Fib (coumadin) s/p pace maker.  She went to inpatient rehab and on May 29 readmitted to hospital in the setting of tachypnea and an enlarging left-sided pleural effusion. Thoracentesis was performed which showed a hemothorax.  A CT chest was then ordered showing obstruction of her left mainstem bronchus and a loculated left pleural effusion. She had bronchoscopy and L chest tube placed 5/30.  She was intubated 5/30-12/19/19. Pt reintubated 6/6 and underwent lt thoracotomy to drain hemothorax. Post op found to have craniotomy area fuller and CT showed  hemorrhage into the posterior R MCA distribution. Pt developed AKI and CRRT 6/11- 6/20. Intermittent HD initiated. Trach placed 6/22.     PT Comments    Pt with good participation and engagement in PT session today. Bed egress utilized to attain chair position. +2 max assist to pull forward in sitting without trunk supported on bed. Verbal cues needed to keep head lifted and looking forward. A/AAROM exercises BLE/RUE. PROM LUE. Pt supine in bed at end of session. Pt not transferred to recliner using maxisky due to scheduled for HD today.  Pt began coughing upon return to supine. HOB at 30 degrees, RR 28, and SpO2 99%. RN notified and relayed possible need for suctioning.   Follow Up Recommendations  LTACH     Equipment Recommendations  Other (comment) (defer to next venue)    Recommendations for Other Services       Precautions / Restrictions Precautions Precautions: Fall;Other (comment) Precaution Comments: R hemicraniectomy- bone flap in R abdomen, needs helmet for OOB, PEG, trach     Mobility  Bed Mobility Overal bed mobility: Needs Assistance       Supine to sit: Max assist;+2 for physical assistance Sit to supine: +2 for physical assistance;Max assist   General bed mobility comments: Bed egress for sitting. +2 max assist to pull forward and sit with trunk unsupported by bed. Sitting 2-3/minutes x 3 trials.  Transfers                    Ambulation/Gait                 Stairs             Wheelchair Mobility    Modified Rankin (Stroke Patients Only)       Balance Overall balance assessment: Needs assistance Sitting-balance support: Feet unsupported;No upper extremity supported Sitting balance-Leahy Scale: Poor Sitting balance - Comments: max assist to maintain sitting balance                                    Cognition Arousal/Alertness: Awake/alert Behavior During Therapy: Flat affect Overall Cognitive Status: Difficult to assess Area of Impairment: Attention;Following commands                   Current Attention Level: Focused   Following Commands: Follows one step commands with increased time              Exercises      General Comments General comments (skin integrity, edema,  etc.): max RR 38, but primarily 30-32. SpO2 95-99% on trach collar      Pertinent Vitals/Pain Pain Assessment: Faces Faces Pain Scale: No hurt    Home Living                      Prior Function            PT Goals (current goals can now be found in the care plan section) Acute Rehab PT Goals Patient Stated Goal: unable to state Progress towards PT goals: Progressing toward goals    Frequency    Min 2X/week      PT Plan Current plan remains appropriate    Co-evaluation              AM-PAC PT "6 Clicks" Mobility   Outcome Measure  Help needed turning from your back to your side while in a flat bed without using bedrails?: Total Help needed moving from lying on your back to  sitting on the side of a flat bed without using bedrails?: Total Help needed moving to and from a bed to a chair (including a wheelchair)?: Total Help needed standing up from a chair using your arms (e.g., wheelchair or bedside chair)?: Total Help needed to walk in hospital room?: Total Help needed climbing 3-5 steps with a railing? : Total 6 Click Score: 6    End of Session Equipment Utilized During Treatment: Oxygen;Other (comment) (trach collar) Activity Tolerance: Patient tolerated treatment well Patient left: in bed;with call bell/phone within reach Nurse Communication: Need for lift equipment PT Visit Diagnosis: Other abnormalities of gait and mobility (R26.89);Hemiplegia and hemiparesis Hemiplegia - Right/Left: Left Hemiplegia - dominant/non-dominant: Non-dominant Hemiplegia - caused by: Cerebral infarction;Nontraumatic intracerebral hemorrhage     Time: 1610-9604 PT Time Calculation (min) (ACUTE ONLY): 29 min  Charges:  $Therapeutic Activity: 23-37 mins                     Lorrin Goodell, PT  Office # [phone] Pager [phone]    Lorriane Shire 01/26/2020, 11:58 AM

## 2020-01-26 NOTE — Progress Notes (Signed)
Pt started coughing and sats dropped into low 80s. Suctioning, bagging and sterile saline inserted with RT present. CCM notified. Pt coughed up medium size clot and sats returned into upper 90s.  Will continue to monitor closely.

## 2020-01-26 NOTE — Progress Notes (Signed)
Patient ID: Krystal Little, female   DOB: [birth year], 64 y.o.   MRN: [id number] S: Desaturated in setting of coughing with subsequent suction of medium sized clots.  Seems comfortable this AM and denies pain, dyspnea.   O:BP (!) 145/67   Pulse 64   Temp 98.9 F (37.2 C) (Oral)   Resp (!) 23   Ht '5\' 4"'  (1.626 m)   Wt 56.9 kg   SpO2 97%   BMI 21.53 kg/m   Intake/Output Summary (Last 24 hours) at 01/26/2020 5427 Last data filed at 01/26/2020 0700 Gross per 24 hour  Intake 990 ml  Output --  Net 990 ml   Intake/Output: I/O last 3 completed shifts: In: 0623 [NG/GT:1565] Out: -   Intake/Output this shift:  No intake/output data recorded. Weight change: 0.5 kg Gen: NAD, answers simple questions with  CVS: RRR, no rub Resp: occ rhonchi Abd: benign Ext: trace thigh edema.   Recent Labs  Lab 01/20/20 0554 01/21/20 0445 01/22/20 0500 01/23/20 0507 01/24/20 0313 01/25/20 0236 01/26/20 0303  NA 130* 130* 133* 132* 134* 129* 128*  K 3.4* 3.9 4.2 4.3 4.1 4.7 4.7  CL 93* 92* 96* 95* 96* 93* 91*  CO2 '26 26 27 25 28 24 22  ' GLUCOSE 134* 168* 140* 98 111* 112* 110*  BUN 48* 72* 43* 75* 41* 67* 94*  CREATININE 3.26* 4.31* 2.87* 4.13* 2.63* 3.87* 4.88*  ALBUMIN 1.3* 1.3* 1.3* 1.2* 1.3* 1.2* 1.3*  CALCIUM 7.9* 8.1* 8.4* 8.3* 8.1* 8.3* 8.1*  PHOS 3.5 4.6 4.6 5.0* 4.1 6.1* 7.0*  AST 189* 145*  --   --   --   --   --   ALT 70* 66*  --   --   --   --   --    Liver Function Tests: Recent Labs  Lab 01/20/20 0554 01/20/20 0554 01/21/20 0445 01/22/20 0500 01/24/20 0313 01/25/20 0236 01/26/20 0303  AST 189*  --  145*  --   --   --   --   ALT 70*  --  66*  --   --   --   --   ALKPHOS 860*  --  840*  --   --   --   --   BILITOT 0.6  --  0.7  --   --   --   --   PROT 8.4*  --  8.6*  --   --   --   --   ALBUMIN 1.3*   < > 1.3*   < > 1.3* 1.2* 1.3*   < > = values in this interval not displayed.   No results for input(s): LIPASE, AMYLASE in the last 168 hours. No results for  input(s): AMMONIA in the last 168 hours. CBC: Recent Labs  Lab 01/22/20 0500 01/22/20 0500 01/23/20 0507 01/23/20 0507 01/24/20 0313 01/25/20 0238 01/26/20 0303  WBC 14.8*   < > 13.6*   < > 13.3* 14.0* 14.6*  NEUTROABS 12.5*   < > 10.4*   < > 10.1* 10.3* 11.1*  HGB 8.6*   < > 8.0*   < > 8.2* 8.1* 8.2*  HCT 28.9*   < > 26.0*   < > 27.0* 26.3* 25.9*  MCV 96.3  --  93.9  --  95.4 93.6 92.5  PLT 162   < > 175   < > 207 215 205   < > = values in this interval not displayed.   Cardiac Enzymes: No results for  input(s): CKTOTAL, CKMB, CKMBINDEX, TROPONINI in the last 168 hours. CBG: Recent Labs  Lab 01/25/20 1550 01/25/20 1925 01/25/20 2329 01/26/20 0317 01/26/20 0757  GLUCAP 146* 108* 158* 125* 140*    Iron Studies: No results for input(s): IRON, TIBC, TRANSFERRIN, FERRITIN in the last 72 hours. Studies/Results: No results found. Marland Kitchen amantadine  100 mg Per Tube BID  . amiodarone  200 mg Per Tube Daily  . aspirin  325 mg Per Tube Daily  . atorvastatin  40 mg Per Tube Daily  . chlorhexidine gluconate (MEDLINE KIT)  15 mL Mouth Rinse BID  . Chlorhexidine Gluconate Cloth  6 each Topical Q0600  . darbepoetin (ARANESP) injection - DIALYSIS  100 mcg Intravenous Q Fri-HD  . docusate  100 mg Per Tube BID  . feeding supplement (PROSource TF)  45 mL Per Tube Daily  . free water  200 mL Per Tube Q6H  . Gerhardt's butt cream   Topical TID  . insulin aspart  0-20 Units Subcutaneous Q4H  . levETIRAcetam  750 mg Per Tube Q24H  . mouth rinse  15 mL Mouth Rinse 10 times per day  . multivitamin  1 tablet Per Tube QHS  . pantoprazole sodium  40 mg Per Tube QHS  . phenytoin  100 mg Per Tube Q8H  . polyethylene glycol  17 g Per Tube TID  . senna-docusate  1 tablet Per Tube BID  . sodium chloride flush  10-40 mL Intracatheter Q12H    BMET    Component Value Date/Time   NA 128 (L) 01/26/2020 0303   NA 140 08/18/2019 1120   K 4.7 01/26/2020 0303   CL 91 (L) 01/26/2020 0303   CO2 22  01/26/2020 0303   GLUCOSE 110 (H) 01/26/2020 0303   BUN 94 (H) 01/26/2020 0303   BUN 12 08/18/2019 1120   CREATININE 4.88 (H) 01/26/2020 0303   CREATININE 0.73 05/18/2016 0919   CALCIUM 8.1 (L) 01/26/2020 0303   GFRNONAA 9 (L) 01/26/2020 0303   GFRNONAA 89 05/18/2016 0919   GFRAA 10 (L) 01/26/2020 0303   GFRAA >89 05/18/2016 0919   CBC    Component Value Date/Time   WBC 14.6 (H) 01/26/2020 0303   RBC 2.80 (L) 01/26/2020 0303   HGB 8.2 (L) 01/26/2020 0303   HGB 15.2 08/18/2019 1120   HCT 25.9 (L) 01/26/2020 0303   HCT 45.9 08/18/2019 1120   PLT 205 01/26/2020 0303   PLT 168 08/18/2019 1120   MCV 92.5 01/26/2020 0303   MCV 94 08/18/2019 1120   MCH 29.3 01/26/2020 0303   MCHC 31.7 01/26/2020 0303   RDW 18.5 (H) 01/26/2020 0303   RDW 12.2 08/18/2019 1120   LYMPHSABS 1.7 01/26/2020 0303   MONOABS 1.3 (H) 01/26/2020 0303   EOSABS 0.4 01/26/2020 0303   BASOSABS 0.1 01/26/2020 0303    Assessment/Plan:  1. AKI- due to ischemic ATN in setting of hemorrhagic shock requiring pressor support and initiated on CRRT 6/11-6/20/21 and transitioned to IHD on 6/21 and has remained oliguric and dialysis dependent. 1. Will continue with HD on MWF schedule for now. 2. Has been tolerating HD 3. No signs of renal recovery at this time. 4. Increased UF goal today due to increase in weight after resuming TF's and drop in serum sodium. 2. Left sided hemothorax- s/p VATS on 6/6 and s/p removal of chest tubes 3. RMCA stroke- s/p thrombectomy 4/16 c/b worsening cerebral edema with mass effect s/p decompressive right hemicraniectomy and placement of bone flap on  11/02/19, new ICH 12/20/2019. 4. VDRF- s/p trach 6/22/2, tol TC now with overnight desats and suction clots noted.  CXR ordered this AM.  I don't think DDAVP is needed due to BUN < 100 + HD planned for today. 5. Chronic atrial fibrillation- on amio and asa 6. HTN- stable 7. Seizure disorder- per neuro 8. Nutrition- s/p PEG by IR on  6/29 9. ABLA/Anemia of critical illness- s/p multiple transfusions 10. Disposition- will need LTAC but poor overall prognosis. Continue with ongoing discussions with family regarding poor prognosis for meaningful recovery.  Jannifer Hick MD [facility] Pager [phone]

## 2020-01-27 LAB — CBC WITH DIFFERENTIAL/PLATELET
Abs Immature Granulocytes: 0.13 10*3/uL — ABNORMAL HIGH (ref 0.00–0.07)
Basophils Absolute: 0.1 10*3/uL (ref 0.0–0.1)
Basophils Relative: 0 %
Eosinophils Absolute: 0.4 10*3/uL (ref 0.0–0.5)
Eosinophils Relative: 2 %
HCT: 28 % — ABNORMAL LOW (ref 36.0–46.0)
Hemoglobin: 8.5 g/dL — ABNORMAL LOW (ref 12.0–15.0)
Immature Granulocytes: 1 %
Lymphocytes Relative: 7 %
Lymphs Abs: 1.3 10*3/uL (ref 0.7–4.0)
MCH: 28.7 pg (ref 26.0–34.0)
MCHC: 30.4 g/dL (ref 30.0–36.0)
MCV: 94.6 fL (ref 80.0–100.0)
Monocytes Absolute: 1.1 10*3/uL — ABNORMAL HIGH (ref 0.1–1.0)
Monocytes Relative: 6 %
Neutro Abs: 14.6 10*3/uL — ABNORMAL HIGH (ref 1.7–7.7)
Neutrophils Relative %: 84 %
Platelets: 206 10*3/uL (ref 150–400)
RBC: 2.96 MIL/uL — ABNORMAL LOW (ref 3.87–5.11)
RDW: 18.9 % — ABNORMAL HIGH (ref 11.5–15.5)
WBC: 17.6 10*3/uL — ABNORMAL HIGH (ref 4.0–10.5)
nRBC: 0 % (ref 0.0–0.2)

## 2020-01-27 LAB — RENAL FUNCTION PANEL
Albumin: 1.3 g/dL — ABNORMAL LOW (ref 3.5–5.0)
Anion gap: 12 (ref 5–15)
BUN: 50 mg/dL — ABNORMAL HIGH (ref 8–23)
CO2: 24 mmol/L (ref 22–32)
Calcium: 7.8 mg/dL — ABNORMAL LOW (ref 8.9–10.3)
Chloride: 95 mmol/L — ABNORMAL LOW (ref 98–111)
Creatinine, Ser: 3.28 mg/dL — ABNORMAL HIGH (ref 0.44–1.00)
GFR calc Af Amer: 16 mL/min — ABNORMAL LOW (ref >60)
GFR calc non Af Amer: 14 mL/min — ABNORMAL LOW (ref >60)
Glucose, Bld: 182 mg/dL — ABNORMAL HIGH (ref 70–99)
Phosphorus: 4.3 mg/dL (ref 2.5–4.6)
Potassium: 4 mmol/L (ref 3.5–5.1)
Sodium: 131 mmol/L — ABNORMAL LOW (ref 135–145)

## 2020-01-27 LAB — GLUCOSE, CAPILLARY
Glucose-Capillary: 173 mg/dL — ABNORMAL HIGH (ref 70–99)
Glucose-Capillary: 152 mg/dL — ABNORMAL HIGH (ref 70–99)
Glucose-Capillary: 108 mg/dL — ABNORMAL HIGH (ref 70–99)
Glucose-Capillary: 239 mg/dL — ABNORMAL HIGH (ref 70–99)
Glucose-Capillary: 131 mg/dL — ABNORMAL HIGH (ref 70–99)

## 2020-01-27 NOTE — Evaluation (Signed)
Clinical/Bedside Swallow Evaluation Patient Details  Name: Krystal Little MRN: [id number] Date of Birth: 1955-11-21  Today's Date: 01/27/2020 Time: SLP Start Time (ACUTE ONLY): 3734 SLP Stop Time (ACUTE ONLY): 1533 SLP Time Calculation (min) (ACUTE ONLY): 12 min  Past Medical History:  Past Medical History:  Diagnosis Date  . Chronic atrial fibrillation ([place]) 1990s   s/p DCCV then attempted ablation of complex A Flutter (@ [facility] - Dr. Deno Etienne), failed antiarrhythmics --> INR folllwed @ [facility] ->> now status post pacemaker placement with underlying A. fib.  . Diabetes mellitus type 2, uncontrolled, without complications    On Oral Medications ([facility])  . Essential hypertension   . History of cardiac catheterization 2001   R&LHC - normal Coronaries, no evidence of Restictive Cardiomyopathy or Constrictive Pericarditis (also @ [facility])  . Hx of sick sinus syndrome 07/1999   Wtih symptomatic bradycardia - syncope (Tachy-Brady)  . 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 CARDIAC CATHETERIZATION  08/03/1999   [facility] - normal Coronaries; no sign of constriction or restrictive cardiomypathy  . CRANIECTOMY FOR DEPRESSED SKULL FRACTURE Right 11/02/2019   Procedure: DECOMPRESSIVE HEMI -CRANIECTOMY, IMPLANTATION OF SKULL FLAP TO RIGHT ABDOMEN;  Surgeon: Consuella Lose, MD;  Location: [place];  Service: Neurosurgery;  Laterality: Right;  . IR CT HEAD LTD  10/31/2019  . IR FLUORO GUIDE CV LINE RIGHT  01/13/2020  . IR GASTROSTOMY TUBE MOD SED  01/13/2020  . IR  PERCUTANEOUS ART THROMBECTOMY/INFUSION INTRACRANIAL INC DIAG ANGIO  10/31/2019      . IR PERCUTANEOUS ART THROMBECTOMY/INFUSION INTRACRANIAL INC DIAG ANGIO  10/31/2019  . IR US GUIDE VASC ACCESS RIGHT  01/13/2020  . MITRAL VALVE REPAIR  07/1999   Both Ant& Post leaflet repair - quadrangular resection&caudal transposition, #32 Sequin Annuloplasty ring  . PACEMAKER GENERATOR CHANGE  11/26/2008   Medtronic Adapta L(? if it has been checked since 02/2013)  . PACEMAKER INSERTION  07/1999   for SSS in setting of Chronic Afib; Medtronic Kappa [id number], [id number].  Marland Kitchen RADIOLOGY WITH ANESTHESIA N/A 10/31/2019   Procedure: IR WITH ANESTHESIA;  Surgeon: Radiologist, Medication, MD;  Location: [place];  Service: Radiology;  Laterality: N/A;  . TRANSTHORACIC ECHOCARDIOGRAM  05/2018   Normal LV size and thickness.  EF estimated 50%.  Inferobasal HK and flattened septum c/w with elevated PA pressures. Mild functional mitral stenosis at rest (postoperative).  Moderate left atrial dilation and mild right atrial dilation.  Mean PA pressures estimated 52 mmHg.  Marland Kitchen VIDEO ASSISTED THORACOSCOPY (VATS)/THOROCOTOMY Left 01/10/2020   Procedure: Video Assisted Thoracoscopy (Vats) left , Drainage of Hemothorax;  Surgeon: Ivin Poot, MD;  Location: [place];  Service: Thoracic;  Laterality: Left;  Marland Kitchen VIDEO BRONCHOSCOPY N/A 01/06/2020   Procedure: Video Bronchoscopy;  Surgeon: Ivin Poot, MD;  Location: [place];  Service: Thoracic;  Laterality: N/A;   HPI:  Pt is a 64 y.o. female presented to [facility] ED as a code stroke for R gaze deviation and L-sided weakness. CT revealed a large R MCA infarct. Pt s/p R  hemicraniectomy with bone flap placed in abdomen on 4/18. ETT 4/18-4/25. PMH HTN, DM2, chronic a. Fib (coumadin) s/p pace maker. Pt admitted to [facility] 11/20/19.  Pulmonary and critical care medicine was consulted on May 29 in the setting of tachypnea and an enlarging left-sided pleural effusion. Thoracentesis was performed which showed a  hemothorax.  A CT chest was then ordered showing obstruction of her left mainstem bronchus and a loculated left pleural effusion.  On May 30 she was moved to the ICU for chest tube placement and bronchoscopy. Pt was re-intubated from 11/26/2019-12/19/19. Most recent MBS on 12/03/19 recommended Dysphagia 2 solids and thin liquids. Right frontocentral seizures seizures noted on EEG 6/12. Trach 6/22. G-tube and tunnel cath on 6/29.    Assessment / Plan / Recommendation Clinical Impression  Pt was seen for bedside swallow evaluation with her husband present. Passy-Muir Speaking Valve (PMSV) was in place throughout the evaluation and vitals were within functional range. Pt's most recent swallow evaluation on 12/20/19 was significant for signs of aspiration with liquids and solids. Trials were limited per pt's request and considering her level of impairment prior to trach. She demonstrated reduced bolus awareness with ice chips and required intermittent cueing for bolus manipulation; however, no s/sx of aspiration were noted. It is recommended that the pt's NPO status be maintained with non-oral alimentation via G-tube. SLP will follow for dysphagia treatment. SLP Visit Diagnosis: Dysphagia, oropharyngeal phase (R13.12)    Aspiration Risk  Moderate aspiration risk;Severe aspiration risk    Diet Recommendation NPO;Alternative means - long-term   Medication Administration: Via alternative means    Other  Recommendations Oral Care Recommendations: Oral care QID;Oral care prior to ice chip/H20;Staff/trained caregiver to provide oral care Other Recommendations: Have oral suction available;Remove water pitcher   Follow up Recommendations LTACH      Frequency and Duration min 1 x/week  2 weeks       Prognosis Prognosis for Safe Diet Advancement: Good Barriers to Reach Goals: Cognitive deficits;Severity of deficits;Time post onset      Swallow Study   General Date of Onset: 12/20/19 HPI: Pt is a 64 y.o.  female presented to [facility] ED as a code stroke for R gaze deviation and L-sided weakness. CT revealed a large R MCA infarct. Pt s/p R hemicraniectomy with bone flap placed in abdomen on 4/18. ETT 4/18-4/25. PMH HTN, DM2, chronic a. Fib (coumadin) s/p pace maker. Pt admitted to [facility] 11/20/19.  Pulmonary and critical care medicine was consulted on May 29 in the setting of tachypnea and an enlarging left-sided pleural effusion. Thoracentesis was performed which showed a hemothorax.  A CT chest was then ordered showing obstruction of her left mainstem bronchus and a loculated left pleural effusion.  On May 30 she was moved to the ICU for chest tube placement and bronchoscopy. Pt was re-intubated from 12/07/2019-12/19/19. Most recent MBS on 12/03/19 recommended Dysphagia 2 solids and thin liquids. Right frontocentral seizures seizures noted on EEG 6/12. Trach 6/22. G-tube and tunnel cath on 6/29.  Type of Study: Bedside Swallow Evaluation Previous Swallow Assessment: BSE 12/20/19 Diet Prior to this Study: NPO;PEG tube Temperature Spikes Noted: No Respiratory Status: Trach;Trach Collar Trach Size and Type: #6;Cuff;Deflated;With PMSV in place History of Recent Intubation: Yes Length of Intubations (days): 5 days (most recent intubation from 5/30-6/4) Date extubated: 12/19/19 Behavior/Cognition: Alert;Cooperative Oral Cavity Assessment: Within Functional Limits Oral Care Completed by SLP: No Oral Cavity - Dentition: Adequate natural dentition Vision: Functional for self-feeding (impacted by left inattention) Self-Feeding  Abilities: Total assist Patient Positioning: Upright in bed Baseline Vocal Quality: Hoarse;Low vocal intensity;Breathy Volitional Cough: Weak Volitional Swallow: Able to elicit    Oral/Motor/Sensory Function Overall Oral Motor/Sensory Function: Moderate impairment Facial ROM: Reduced left;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 Facial Symmetry: Abnormal symmetry left;Suspected CN VII (facial)  dysfunction Facial Strength: Reduced left;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 Lingual ROM: Reduced right;Reduced left Lingual Symmetry: Abnormal symmetry left Lingual Strength: Reduced   Ice Chips Ice chips: Impaired Presentation: Spoon Oral Phase Impairments: Poor awareness of bolus Other Comments:  (for limited , small boluses)   Thin Liquid Thin Liquid: Not tested    Nectar Thick Nectar Thick Liquid: Not tested   Honey Thick Honey Thick Liquid: Not tested   Puree Puree: Not tested   Solid           Eisley Barber I. [name], [place], [place] Office number [phone] Pager [phone]  Horton Marshall 01/27/2020,5:43 PM

## 2020-01-27 NOTE — Progress Notes (Signed)
  Speech Language Pathology Treatment: Nada Boozer Speaking valve  Patient Details Name: Krystal Little MRN: [id number] DOB: [birth year] Today's Date: 01/27/2020 Time: 7915-0569 SLP Time Calculation (min) (ACUTE ONLY): 23 min  Assessment / Plan / Recommendation Clinical Impression  Pt was alert and coopertive throughout the session. Pt's husband was present for the entirety of the session. Vitals were RR 23, SpO2 94%, and HR 65 at baseline. Pt's cuff was deflated upon SLP's entry and tracheal suctioning conducted by Mendel Ryder, Therapist, sports. She tolerated PMSV for 27 minutes total including the time of the swallow evaluation. Vitals range was RR 26-30, SpO2 92-96%, and HR 68-80 during this period. Work of breathing was mildly increased during the session but pt denied significant discomfort or respiratory difficulty. Vocal quality was hoarse and vocal intensity significantly reduced. Repetition was consistently required and intelligibility was reduced even with a known context. Pt required moderate cues to improve coordination of respiration with speech, but was still unable to demonstrate adequate respiratory support for production of more than two-three words. It is recommended that PMSV be used only with SLP at this time and SLP will continue to follow pt.    HPI HPI: Pt is a 64 y.o. female presented to [facility] ED as a code stroke for R gaze deviation and L-sided weakness. CT revealed a large R MCA infarct. Pt s/p R hemicraniectomy with bone flap placed in abdomen on 4/18. ETT 4/18-4/25. PMH HTN, DM2, chronic a. Fib (coumadin) s/p pace maker. Pt admitted to [facility] 11/20/19.  Pulmonary and critical care medicine was consulted on May 29 in the setting of tachypnea and an enlarging left-sided pleural effusion. Thoracentesis was performed which showed a hemothorax.  A CT chest was then ordered showing obstruction of her left mainstem bronchus and a loculated left pleural effusion.  On May 30 she was moved to the ICU for chest  tube placement and bronchoscopy. Pt was re-intubated from 11/23/2019-12/19/19. Most recent MBS on 12/03/19 recommended Dysphagia 2 solids and thin liquids. Right frontocentral seizures seizures noted on EEG 6/12. Trach 6/22. G-tube and tunnel cath on 6/29.       SLP Plan  Continue with current plan of care       Recommendations         Patient may use Passy-Muir Speech Valve: with SLP only PMSV Supervision: Full         Follow up Recommendations: LTACH SLP Visit Diagnosis: Aphonia (R49.1) Plan: Continue with current plan of care       Venice Marcucci I. [name], [place], [place] Office number [phone] Pager [place] 01/27/2020, 5:28 PM

## 2020-01-27 NOTE — Progress Notes (Signed)
Patient ID: Krystal Little, female   DOB: Jul 02, 1956, 64 y.o.   MRN: [id number] S: sleepy this AM.  Per RN no new issues.    O:BP (!) 111/50   Pulse (!) 59   Temp 98.4 F (36.9 C) (Axillary)   Resp (!) 22   Ht '5\' 4"'  (1.626 m)   Wt 54.9 kg   SpO2 99%   BMI 20.78 kg/m   Intake/Output Summary (Last 24 hours) at 01/27/2020 0743 Last data filed at 01/27/2020 0700 Gross per 24 hour  Intake 995 ml  Output 3613 ml  Net -2618 ml   Intake/Output: I/O last 3 completed shifts: In: [place] [Other:50; NG/GT:1485] Out: 2633 [HLKTG:2563]  Intake/Output this shift:  No intake/output data recorded. Weight change: -2 kg Gen: NAD, sleepy CVS: RRR, no rub Resp: occ rhonchi Abd: benign Ext: trace dependent thigh edema, veyr little  Recent Labs  Lab 01/21/20 0445 01/22/20 0500 01/23/20 0507 01/24/20 0313 01/25/20 0236 01/26/20 0303 01/27/20 0450  NA 130* 133* 132* 134* 129* 128* 131*  K 3.9 4.2 4.3 4.1 4.7 4.7 4.0  CL 92* 96* 95* 96* 93* 91* 95*  CO2 '26 27 25 28 24 22 24  ' GLUCOSE 168* 140* 98 111* 112* 110* 182*  BUN 72* 43* 75* 41* 67* 94* 50*  CREATININE 4.31* 2.87* 4.13* 2.63* 3.87* 4.88* 3.28*  ALBUMIN 1.3* 1.3* 1.2* 1.3* 1.2* 1.3* 1.3*  CALCIUM 8.1* 8.4* 8.3* 8.1* 8.3* 8.1* 7.8*  PHOS 4.6 4.6 5.0* 4.1 6.1* 7.0* 4.3  AST 145*  --   --   --   --   --   --   ALT 66*  --   --   --   --   --   --    Liver Function Tests: Recent Labs  Lab 01/21/20 0445 01/22/20 0500 01/25/20 0236 01/26/20 0303 01/27/20 0450  AST 145*  --   --   --   --   ALT 66*  --   --   --   --   ALKPHOS 840*  --   --   --   --   BILITOT 0.7  --   --   --   --   PROT 8.6*  --   --   --   --   ALBUMIN 1.3*   < > 1.2* 1.3* 1.3*   < > = values in this interval not displayed.   No results for input(s): LIPASE, AMYLASE in the last 168 hours. No results for input(s): AMMONIA in the last 168 hours. CBC: Recent Labs  Lab 01/23/20 0507 01/23/20 0507 01/24/20 0313 01/24/20 0313 01/25/20 0238 01/26/20 0303  01/27/20 0450  WBC 13.6*   < > 13.3*   < > 14.0* 14.6* 17.6*  NEUTROABS 10.4*   < > 10.1*   < > 10.3* 11.1* 14.6*  HGB 8.0*   < > 8.2*   < > 8.1* 8.2* 8.5*  HCT 26.0*   < > 27.0*   < > 26.3* 25.9* 28.0*  MCV 93.9  --  95.4  --  93.6 92.5 94.6  PLT 175   < > 207   < > 215 205 206   < > = values in this interval not displayed.   Cardiac Enzymes: No results for input(s): CKTOTAL, CKMB, CKMBINDEX, TROPONINI in the last 168 hours. CBG: Recent Labs  Lab 01/26/20 1225 01/26/20 1647 01/26/20 1940 01/26/20 2336 01/27/20 0348  GLUCAP 107* 116* 177* 116* 173*  Iron Studies: No results for input(s): IRON, TIBC, TRANSFERRIN, FERRITIN in the last 72 hours. Studies/Results: DG CHEST PORT 1 VIEW  Result Date: 01/26/2020 CLINICAL DATA:  Hypoxia EXAM: PORTABLE CHEST 1 VIEW COMPARISON:  January 21, 2020 FINDINGS: Tracheostomy catheter tip is 4.8 cm above the carina. Dual lumen central catheter has its tip in the superior vena cava near the cavoatrial junction. Left peripherally inserted central catheter has its tip in the superior vena cava. Pacemaker lead attached to right ventricle. Skin fold noted on the left. No pneumothorax. There is a moderate pleural effusion on the left, partially loculated, with atelectatic change in the left base. Hazy opacity left upper lobe is stable and likely represents loculated effusion. Right lung is clear. Heart is mildly enlarged with pulmonary vascularity normal. Patient is status post median sternotomy. IMPRESSION: Tube and catheter positions as described without pneumothorax. Loculated pleural effusion on the left with left base atelectasis. Opacity left upper lobe likely represents another focus of loculated fluid. Mild airspace opacity superimposed in this area is possible. Right lung is clear. Stable cardiac silhouette. Electronically Signed   By: Lowella Grip III M.D.   On: 01/26/2020 08:51   . amantadine  100 mg Per Tube BID  . amiodarone  200 mg Per Tube  Daily  . aspirin  325 mg Per Tube Daily  . atorvastatin  40 mg Per Tube Daily  . chlorhexidine gluconate (MEDLINE KIT)  15 mL Mouth Rinse BID  . Chlorhexidine Gluconate Cloth  6 each Topical Q0600  . darbepoetin (ARANESP) injection - DIALYSIS  100 mcg Intravenous Q Fri-HD  . docusate  100 mg Per Tube BID  . feeding supplement (PROSource TF)  45 mL Per Tube Daily  . Gerhardt's butt cream   Topical TID  . guaiFENesin  600 mg Oral BID  . insulin aspart  0-20 Units Subcutaneous Q4H  . levETIRAcetam  750 mg Per Tube Q24H  . mouth rinse  15 mL Mouth Rinse 10 times per day  . multivitamin  1 tablet Per Tube QHS  . pantoprazole sodium  40 mg Per Tube QHS  . phenytoin  100 mg Per Tube Q8H  . polyethylene glycol  17 g Per Tube TID  . senna-docusate  1 tablet Per Tube BID  . sodium chloride flush  10-40 mL Intracatheter Q12H    BMET    Component Value Date/Time   NA 131 (L) 01/27/2020 0450   NA 140 08/18/2019 1120   K 4.0 01/27/2020 0450   CL 95 (L) 01/27/2020 0450   CO2 24 01/27/2020 0450   GLUCOSE 182 (H) 01/27/2020 0450   BUN 50 (H) 01/27/2020 0450   BUN 12 08/18/2019 1120   CREATININE 3.28 (H) 01/27/2020 0450   CREATININE 0.73 05/18/2016 0919   CALCIUM 7.8 (L) 01/27/2020 0450   GFRNONAA 14 (L) 01/27/2020 0450   GFRNONAA 89 05/18/2016 0919   GFRAA 16 (L) 01/27/2020 0450   GFRAA >89 05/18/2016 0919   CBC    Component Value Date/Time   WBC 17.6 (H) 01/27/2020 0450   RBC 2.96 (L) 01/27/2020 0450   HGB 8.5 (L) 01/27/2020 0450   HGB 15.2 08/18/2019 1120   HCT 28.0 (L) 01/27/2020 0450   HCT 45.9 08/18/2019 1120   PLT 206 01/27/2020 0450   PLT 168 08/18/2019 1120   MCV 94.6 01/27/2020 0450   MCV 94 08/18/2019 1120   MCH 28.7 01/27/2020 0450   MCHC 30.4 01/27/2020 0450   RDW 18.9 (H) 01/27/2020 0450  RDW 12.2 08/18/2019 1120   LYMPHSABS 1.3 01/27/2020 0450   MONOABS 1.1 (H) 01/27/2020 0450   EOSABS 0.4 01/27/2020 0450   BASOSABS 0.1 01/27/2020 0450     Assessment/Plan:  1. AKI- due to ischemic ATN in setting of hemorrhagic shock requiring pressor support and initiated on CRRT 6/11-6/20/21 and transitioned to IHD on 6/21 and has remained oliguric and dialysis dependent. 1. Will continue with HD on MWF schedule for now. 2. Has been tolerating HD - UF 3.6L yesterday 3. No signs of renal recovery at this time. 4. Next HD tomorrow, plan 2-3L as tolerated 2. Left sided hemothorax- s/p VATS on 6/6 and s/p removal of chest tubes 3. RMCA stroke- s/p thrombectomy 4/16 c/b worsening cerebral edema with mass effect s/p decompressive right hemicraniectomy and placement of bone flap on 11/02/19, new ICH 01/11/2020. 4. VDRF- s/p trach 6/22/2, tol TC now. 5. Chronic atrial fibrillation- on amio and asa 6. HTN- stable 7. Seizure disorder- per neuro 8. Nutrition- s/p PEG by IR on 6/29 9. ABLA/Anemia of critical illness- s/p multiple transfusions 10. Hyponatremia - noted 7/12; improved with HD and stopping FWF via tube.  Na 131 today, CTM 11. Disposition- will need LTAC but poor overall prognosis. Continue with ongoing discussions with family regarding poor prognosis for meaningful recovery.  Jannifer Hick MD [facility] Pager [phone]

## 2020-01-27 NOTE — Progress Notes (Signed)
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  On 12/13/2019, PCCM was consulted for tachypnea and enlarging left-sided pleural effusion, thoracentesis showed hemothorax.  CT of the chest showed obstruction of her left mainstem bronchus and loculated left pleural effusion.  She was admitted  to ICU under PCCM service on 12/10/2019 for chest tube placement and bronchoscopy.  She underwent intrapleural TPA but required VATS eventually.  Her course was complicated by ICH, seizures, AKI, requiring CRRT.  Patient subsequently had tracheostomy placed on 12/22/2019, NG tube and tunneled vascular catheter placed on 01/13/2020.  Subsequently patient was started on intermittent hemodialysis.  Significant Hospital events 4/16 IR thrombectomy R MCA stroke 5/6 transfer to [facility] 5/29 PCCM consult left pleural effusion, thoracentesis 5/30 worsening tachypnea. Moved to ICU for intubation to facilitate bronchoscopy and chest tube placement 5/31 TPA/ pulmonzyme started for 3 days for persistent left effusion 6/01 Afebrile, low-dose Levophed, Precedex, Minimal output on chest tube- pulmozyme / tpa x 2,  6/02 pulmozyme and TPA completed 3 days 6/03 afib with RVR placed on amio gtt overnight , 1U PRBC for Hb 7.1  6/05 extubated successfully, Repeat TPA/Pulmonzyme given again  6/06 acute decompensation overnight, hemorrhagic shock >to OR for left VATS with 2L hemothorax drained. CT head with new ICH>neurosurgery  consulted 6/09 More awake but agitated on PSV weaning  6/12: Seizures + on EEG 6/22 trach 6/29 g tube and tunneled vascular catheter placed 7/2: Care transferred to [facility]  Subjective   Patient seen and examined, lethargic this morning.  Denies any complaints.   Assessment/Plan:     1. Acute respiratory failure with hypoxia s/p tracheostomy-mucous plugging, suspected aspiration, left-sided loculated effusion complicated with hemothorax status post VATS decortication.  Patient has a long hospitalization and was intubated but subsequently underwent tracheostomy on 01/06/2020.  Patient is requiring mechanical ventilation intermittently.  Wean off as able. Chest x-ray done yesterday shows left-sided pleural effusion with mid and left lung base opacity. Also shows improved aeration in right lung fields. Will continue to monitor. 2. Acute kidney injury-oliguric ischemic ATN secondary to hemorrhagic shock.  Refractory to IV diuretics, started on CRRT from 12/26/2019 6 08/23/2019.  Subsequently started on hemodialysis from 01/05/2020.  Patient is status post right IJ tunneled dialysis catheter placement by IR on 01/13/2020.  Nephrology following and managing hemodialysis.  Renal function is slowly improving.  Today creatinine is 2.63.  Patient is a poor candidate for long-term hemodialysis in this current state. 3. History of right MCA stroke-s/p thrombectomy with residual left-sided paralysis s/p right hemicraniectomy complicated by ICH after intrapleural TPA. 4. Dysphagia-patient without new ICH on 12/24/2019.  Manage medically as per neurosurgery recommendations.  G-tube was placed on 01/13/2020 and feeding was started.  Tube feeding was held yesterday due to vomiting.  She was getting bolus tube feeding.  Continue tube feeds at 45 cc/h.    5. 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  EEG had shown status epilepticus.  Continue phenytoin via G-tube.  Continue Keppra.  As needed  Ativan. 6. Chronic atrial fibrillation-patient has history of sick sinus syndrome s/p  pacemaker placement.  Continue amiodarone, aspirin. 7. Protein calorie malnutrition-tube feed restarted at 45 cc/h.  Tolerating tube feeds well. 8. Diabetes mellitus type 2 with hyperglycemia and hypoglycemia episodes-continue sliding scale insulin with NovoLog. 9. Hyponatremia-patient has mild hyponatremia, will hold free water.  Nephrology following. 10. Anemia chronic disease-secondary to chronic illness hemoglobin has been stable.  Patient  received multiple PRBCs transfusions.  Will transfuse for hemoglobin 7.0. 11. Hypertension-blood pressure is controlled. 12. Overall poor prognosis.  Patient is very deconditioned and has poor prognosis.  Palliative care was consulted and has signed off.  Patient will need LTAC placement.    SpO2: 95 % O2 Flow Rate (L/min): 8 L/min FiO2 (%): 35 %     Lab Results  Component Value Date   SARSCOV2NAA NEGATIVE 10/31/2019     CBG: Recent Labs  Lab 01/26/20 1940 01/26/20 2336 01/27/20 0348 01/27/20 0801 01/27/20 1105  GLUCAP 177* 116* 173* 152* 108*    CBC: Recent Labs  Lab 01/23/20 0507 01/24/20 0313 01/25/20 0238 01/26/20 0303 01/27/20 0450  WBC 13.6* 13.3* 14.0* 14.6* 17.6*  NEUTROABS 10.4* 10.1* 10.3* 11.1* 14.6*  HGB 8.0* 8.2* 8.1* 8.2* 8.5*  HCT 26.0* 27.0* 26.3* 25.9* 28.0*  MCV 93.9 95.4 93.6 92.5 94.6  PLT 175 207 215 205 951    Basic Metabolic Panel: Recent Labs  Lab 01/23/20 0507 01/24/20 0313 01/25/20 0236 01/26/20 0303 01/27/20 0450  NA 132* 134* 129* 128* 131*  K 4.3 4.1 4.7 4.7 4.0  CL 95* 96* 93* 91* 95*  CO2 '25 28 24 22 24  ' GLUCOSE 98 111* 112* 110* 182*  BUN 75* 41* 67* 94* 50*  CREATININE 4.13* 2.63* 3.87* 4.88* 3.28*  CALCIUM 8.3* 8.1* 8.3* 8.1* 7.8*  PHOS 5.0* 4.1 6.1* 7.0* 4.3     Liver Function Tests: Recent Labs  Lab 01/21/20 0445 01/22/20 0500 01/23/20 0507 01/24/20 0313 01/25/20 0236 01/26/20 0303  01/27/20 0450  AST 145*  --   --   --   --   --   --   ALT 66*  --   --   --   --   --   --   ALKPHOS 840*  --   --   --   --   --   --   BILITOT 0.7  --   --   --   --   --   --   PROT 8.6*  --   --   --   --   --   --   ALBUMIN 1.3*   < > 1.2* 1.3* 1.2* 1.3* 1.3*   < > = values in this interval not displayed.     DVT prophylaxis: SCDs  Code Status: Full code  Family Communication: No family at bedside    Status is: Inpatient  Dispo: The patient is from: Home              Anticipated d/c is to: LTAC              Anticipated d/c date is: 01/29/2020              Patient currently not medically stable for discharge  Barrier to discharge-requiring hemodialysis, intermittent mechanical ventilation     Scheduled medications:  . amantadine  100 mg Per Tube BID  . amiodarone  200 mg Per Tube Daily  . aspirin  325 mg Per Tube Daily  . atorvastatin  40 mg Per Tube Daily  . chlorhexidine gluconate (  MEDLINE KIT)  15 mL Mouth Rinse BID  . Chlorhexidine Gluconate Cloth  6 each Topical Q0600  . darbepoetin (ARANESP) injection - DIALYSIS  100 mcg Intravenous Q Fri-HD  . docusate  100 mg Per Tube BID  . feeding supplement (PROSource TF)  45 mL Per Tube Daily  . Gerhardt's butt cream   Topical TID  . guaiFENesin  600 mg Oral BID  . insulin aspart  0-20 Units Subcutaneous Q4H  . levETIRAcetam  750 mg Per Tube Q24H  . mouth rinse  15 mL Mouth Rinse 10 times per day  . multivitamin  1 tablet Per Tube QHS  . pantoprazole sodium  40 mg Per Tube QHS  . phenytoin  100 mg Per Tube Q8H  . polyethylene glycol  17 g Per Tube TID  . senna-docusate  1 tablet Per Tube BID  . sodium chloride flush  10-40 mL Intracatheter Q12H    Consultants:  PCCM  Palliative care  Nephrology  Neurology  Neurosurgery  IR  Procedures: 12/13/2019 thoracentesis: 1.5 L bloody fluid Bronchoscopy on 12/12/2019 ETT: 11/20/2019-01/06/2020 Tracheostomy: 01/06/2020 onwards Left pigtail chest tube  11/19/2019-01/12/2020 [facility] HD cath from 12/27/2019-01/13/2020 G-tube and tunneled vascular catheter placement on 01/13/2020  Overnight EEG on 12/27/2019: Right frontocentral seizures x2   Antibiotics:   Anti-infectives (From admission, onward)   Start     Dose/Rate Route Frequency Ordered Stop   01/15/20 0745  ceFAZolin (ANCEF) IVPB 2g/100 mL premix  Status:  Discontinued        2 g 200 mL/hr over 30 Minutes Intravenous  Once 01/15/20 0739 01/18/20 0938   01/13/20 1645  ceFAZolin (ANCEF) IVPB 2g/100 mL premix        2 g 200 mL/hr over 30 Minutes Intravenous To Radiology 01/13/20 1630 01/13/20 1640   01/13/20 1630  ceFAZolin (ANCEF) IVPB 2g/100 mL premix  Status:  Discontinued        2 g 200 mL/hr over 30 Minutes Intravenous To ShortStay Surgical 01/13/20 1625 01/13/20 1906   01/13/20 0800  ceFAZolin (ANCEF) IVPB 2g/100 mL premix        2 g 200 mL/hr over 30 Minutes Intravenous To Radiology 01/12/20 1007 01/13/20 1836   01/12/20 1030  ceFAZolin (ANCEF) IVPB 2g/100 mL premix  Status:  Discontinued        2 g 200 mL/hr over 30 Minutes Intravenous To Radiology 01/12/20 0927 01/12/20 1007   12/27/19 1000  ceFEPIme (MAXIPIME) 2 g in sodium chloride 0.9 % 100 mL IVPB  Status:  Discontinued        2 g 200 mL/hr over 30 Minutes Intravenous Every 12 hours 12/26/19 1412 12/27/19 1017   12/26/19 1000  ceFEPIme (MAXIPIME) 1 g in sodium chloride 0.9 % 100 mL IVPB  Status:  Discontinued        1 g 200 mL/hr over 30 Minutes Intravenous Every 24 hours 12/25/19 1152 12/26/19 1412   12/20/2019 1244  ceFAZolin (ANCEF) IVPB 2g/100 mL premix  Status:  Discontinued        2 g 200 mL/hr over 30 Minutes Intravenous 30 min pre-op 12/24/2019 1244 12/19/2019 1823   12/20/2019 0600  ceFEPIme (MAXIPIME) 1 g in sodium chloride 0.9 % 100 mL IVPB  Status:  Discontinued        1 g 200 mL/hr over 30 Minutes Intravenous Every 8 hours 01/05/2020 0337 12/25/2019 0340   01/03/2020 0400  vancomycin (VANCOCIN) IVPB 1000 mg/200 mL premix         1,000 mg  200 mL/hr over 60 Minutes Intravenous  Once 01/08/2020 0337 12/17/2019 0653   12/20/2019 0400  ceFEPIme (MAXIPIME) 2 g in sodium chloride 0.9 % 100 mL IVPB  Status:  Discontinued        2 g 200 mL/hr over 30 Minutes Intravenous Every 24 hours 12/30/2019 0340 12/25/19 1152   12/20/19 2200  amoxicillin-clavulanate (AUGMENTIN) 500-125 MG per tablet 500 mg  Status:  Discontinued        1 tablet Oral 2 times daily 12/20/19 1412 12/27/2019 0337   12/20/19 1415  amoxicillin-clavulanate (AUGMENTIN) 875-125 MG per tablet 1 tablet  Status:  Discontinued        1 tablet Oral Every 12 hours 12/20/19 1407 12/20/19 1411   12/13/2019 1245  piperacillin-tazobactam (ZOSYN) IVPB 3.375 g  Status:  Discontinued        3.375 g 12.5 mL/hr over 240 Minutes Intravenous Every 8 hours 11/29/2019 1235 12/20/19 1407       Objective   Vitals:   01/27/20 0900 01/27/20 1000 01/27/20 1100 01/27/20 1119  BP: (!) 137/56 (!) 143/56 (!) 144/56 (!) 144/54  Pulse: 63 60 62 70  Resp: (!) 30 (!) 23 (!) 27 (!) 30  Temp:   97.7 F (36.5 C)   TempSrc:   Oral   SpO2: 99% 98% 98% 95%  Weight:      Height:        Intake/Output Summary (Last 24 hours) at 01/27/2020 1153 Last data filed at 01/27/2020 1100 Gross per 24 hour  Intake 995 ml  Output 3613 ml  Net -2618 ml    07/11 1901 - 07/13 0700 In: 2683  Out: 3613   Filed Weights   01/25/20 0408 01/26/20 0500 01/27/20 0500  Weight: 56.4 kg 56.9 kg 54.9 kg    Physical Examination:  General-appears lethargic Heart-S1-S2, regular, no murmur auscultated Lungs-clear to auscultation bilaterally, no wheezing or crackles auscultated Abdomen-soft, nontender, no organomegaly Extremities-no edema in the lower extremities Neuro-alert,   Data Reviewed:   Recent Results (from the past 240 hour(s))  MRSA PCR Screening     Status: None   Collection Time: 01/18/20  8:56 AM   Specimen: Nasal Mucosa; Nasopharyngeal  Result Value Ref Range Status   MRSA by PCR NEGATIVE  NEGATIVE Final    Comment:        The GeneXpert MRSA Assay (FDA approved for NASAL specimens only), is one component of a comprehensive MRSA colonization surveillance program. It is not intended to diagnose MRSA infection nor to guide or monitor treatment for MRSA infections. Performed at [facility], [place] [address] [place], [place] [address]     No results for input(s): LIPASE, AMYLASE in the last 168 hours. No results for input(s): AMMONIA in the last 168 hours.  Cardiac Enzymes: No results for input(s): CKTOTAL, CKMB, CKMBINDEX, TROPONINI in the last 168 hours. BNP (last 3 results) Recent Labs    11/11/19 2226  BNP 93.9    Studies:  DG CHEST PORT 1 VIEW  Result Date: 01/26/2020 CLINICAL DATA:  Hypoxia EXAM: PORTABLE CHEST 1 VIEW COMPARISON:  January 21, 2020 FINDINGS: Tracheostomy catheter tip is 4.8 cm above the carina. Dual lumen central catheter has its tip in the superior vena cava near the cavoatrial junction. Left peripherally inserted central catheter has its tip in the superior vena cava. Pacemaker lead attached to right ventricle. Skin fold noted on the left. No pneumothorax. There is a moderate pleural effusion on the left, partially loculated, with atelectatic change  in the left base. Hazy opacity left upper lobe is stable and likely represents loculated effusion. Right lung is clear. Heart is mildly enlarged with pulmonary vascularity normal. Patient is status post median sternotomy. IMPRESSION: Tube and catheter positions as described without pneumothorax. Loculated pleural effusion on the left with left base atelectasis. Opacity left upper lobe likely represents another focus of loculated fluid. Mild airspace opacity superimposed in this area is possible. Right lung is clear. Stable cardiac silhouette. Electronically Signed   By: Lowella Grip III M.D.   On: 01/26/2020 08:51       [place]   [facility] If 7PM-7AM, please contact  night-coverage at [email or web address], Office  [phone]   01/27/2020, 11:53 AM  LOS: 44 days

## 2020-01-28 DIAGNOSIS — K921 Melena: Secondary | ICD-10-CM

## 2020-01-28 LAB — GLUCOSE, CAPILLARY
Glucose-Capillary: 101 mg/dL — ABNORMAL HIGH (ref 70–99)
Glucose-Capillary: 108 mg/dL — ABNORMAL HIGH (ref 70–99)
Glucose-Capillary: 127 mg/dL — ABNORMAL HIGH (ref 70–99)
Glucose-Capillary: 137 mg/dL — ABNORMAL HIGH (ref 70–99)
Glucose-Capillary: 173 mg/dL — ABNORMAL HIGH (ref 70–99)
Glucose-Capillary: 187 mg/dL — ABNORMAL HIGH (ref 70–99)

## 2020-01-28 LAB — RENAL FUNCTION PANEL
Albumin: 1.3 g/dL — ABNORMAL LOW (ref 3.5–5.0)
Anion gap: 14 (ref 5–15)
BUN: 85 mg/dL — ABNORMAL HIGH (ref 8–23)
CO2: 23 mmol/L (ref 22–32)
Calcium: 8.2 mg/dL — ABNORMAL LOW (ref 8.9–10.3)
Chloride: 92 mmol/L — ABNORMAL LOW (ref 98–111)
Creatinine, Ser: 4.52 mg/dL — ABNORMAL HIGH (ref 0.44–1.00)
GFR calc Af Amer: 11 mL/min — ABNORMAL LOW (ref >60)
GFR calc non Af Amer: 10 mL/min — ABNORMAL LOW (ref >60)
Glucose, Bld: 158 mg/dL — ABNORMAL HIGH (ref 70–99)
Phosphorus: 5.4 mg/dL — ABNORMAL HIGH (ref 2.5–4.6)
Potassium: 4.3 mmol/L (ref 3.5–5.1)
Sodium: 129 mmol/L — ABNORMAL LOW (ref 135–145)

## 2020-01-28 LAB — TYPE AND SCREEN
ABO/RH(D): O POS
Antibody Screen: NEGATIVE

## 2020-01-28 LAB — CBC WITH DIFFERENTIAL/PLATELET
Abs Immature Granulocytes: 0.18 10*3/uL — ABNORMAL HIGH (ref 0.00–0.07)
Basophils Absolute: 0.1 10*3/uL (ref 0.0–0.1)
Basophils Relative: 0 %
Eosinophils Absolute: 0.5 10*3/uL (ref 0.0–0.5)
Eosinophils Relative: 2 %
HCT: 27.8 % — ABNORMAL LOW (ref 36.0–46.0)
Hemoglobin: 8.5 g/dL — ABNORMAL LOW (ref 12.0–15.0)
Immature Granulocytes: 1 %
Lymphocytes Relative: 9 %
Lymphs Abs: 2 10*3/uL (ref 0.7–4.0)
MCH: 29.2 pg (ref 26.0–34.0)
MCHC: 30.6 g/dL (ref 30.0–36.0)
MCV: 95.5 fL (ref 80.0–100.0)
Monocytes Absolute: 1.4 10*3/uL — ABNORMAL HIGH (ref 0.1–1.0)
Monocytes Relative: 7 %
Neutro Abs: 17.5 10*3/uL — ABNORMAL HIGH (ref 1.7–7.7)
Neutrophils Relative %: 81 %
Platelets: 224 10*3/uL (ref 150–400)
RBC: 2.91 MIL/uL — ABNORMAL LOW (ref 3.87–5.11)
RDW: 19.2 % — ABNORMAL HIGH (ref 11.5–15.5)
WBC: 21.7 10*3/uL — ABNORMAL HIGH (ref 4.0–10.5)
nRBC: 0 % (ref 0.0–0.2)

## 2020-01-28 LAB — HEMOGLOBIN AND HEMATOCRIT, BLOOD
HCT: 28 % — ABNORMAL LOW (ref 36.0–46.0)
HCT: 27.6 % — ABNORMAL LOW (ref 36.0–46.0)
Hemoglobin: 8.6 g/dL — ABNORMAL LOW (ref 12.0–15.0)
Hemoglobin: 8.2 g/dL — ABNORMAL LOW (ref 12.0–15.0)

## 2020-01-28 MED ORDER — OXYCODONE HCL 5 MG/5ML PO SOLN
5.0000 mg | ORAL | Status: DC | PRN
  Administered 2020-01-29 (×2): 5 mg
  Filled 2020-01-28 (×2): qty 5

## 2020-01-28 MED ORDER — GUAIFENESIN 100 MG/5ML PO SOLN
15.0000 mL | ORAL | Status: DC
  Administered 2020-01-28 – 2020-02-01 (×24): 300 mg
  Filled 2020-01-28: qty 20
  Filled 2020-01-28: qty 5
  Filled 2020-01-28: qty 20
  Filled 2020-01-28: qty 10
  Filled 2020-01-28: qty 20
  Filled 2020-01-28 (×6): qty 15
  Filled 2020-01-28: qty 5
  Filled 2020-01-28: qty 30
  Filled 2020-01-28 (×2): qty 15
  Filled 2020-01-28: qty 20
  Filled 2020-01-28 (×4): qty 15
  Filled 2020-01-28: qty 5
  Filled 2020-01-28: qty 10
  Filled 2020-01-28: qty 20
  Filled 2020-01-28: qty 15
  Filled 2020-01-28: qty 20

## 2020-01-28 MED ORDER — LOPERAMIDE HCL 2 MG PO CAPS
2.0000 mg | ORAL_CAPSULE | ORAL | Status: DC | PRN
  Administered 2020-01-28 – 2020-01-29 (×2): 2 mg via ORAL
  Filled 2020-01-28 (×2): qty 1

## 2020-01-28 MED ORDER — SENNOSIDES 8.8 MG/5ML PO SYRP: 5.0000 mL | ORAL_SOLUTION | Freq: Two times a day (BID) | ORAL | Status: DC

## 2020-01-28 MED ORDER — PHENYTOIN 125 MG/5ML PO SUSP
100.0000 mg | Freq: Three times a day (TID) | ORAL | Status: DC
  Administered 2020-01-28 – 2020-02-01 (×13): 100 mg
  Filled 2020-01-28 (×15): qty 4

## 2020-01-28 MED ORDER — HEPARIN SODIUM (PORCINE) 1000 UNIT/ML IJ SOLN
INTRAMUSCULAR | Status: AC
  Administered 2020-01-28: 3200 [IU] via INTRAVENOUS_CENTRAL
  Filled 2020-01-28: qty 1

## 2020-01-28 NOTE — Progress Notes (Signed)
OT Cancellation Note  Patient Details Name: Krystal Little MRN: [id number] DOB: 03-24-1956   Cancelled Treatment:    Reason Eval/Treat Not Completed: Patient at procedure or test/ unavailable (HD), will follow up as able.  Lou Cal, OT Acute Rehabilitation Services Pager [phone] Office [phone]   Raymondo Band 01/28/2020, 10:29 AM

## 2020-01-28 NOTE — Progress Notes (Signed)
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  On 12/13/2019, PCCM was consulted for tachypnea and enlarging left-sided pleural effusion, thoracentesis showed hemothorax.  CT of the chest showed obstruction of her left mainstem bronchus and loculated left pleural effusion.  She was admitted  to ICU under PCCM service on 11/25/2019 for chest tube placement and bronchoscopy.  She underwent intrapleural TPA but required VATS eventually.  Her course was complicated by ICH, seizures, AKI, requiring CRRT.  Patient subsequently had tracheostomy placed on 12/22/2019, NG tube and tunneled vascular catheter placed on 01/13/2020.  Subsequently patient was started on intermittent hemodialysis.  Significant Hospital events 4/16 IR thrombectomy R MCA stroke 5/6 transfer to [facility] 5/29 PCCM consult left pleural effusion, thoracentesis 5/30 worsening tachypnea. Moved to ICU for intubation to facilitate bronchoscopy and chest tube placement 5/31 TPA/ pulmonzyme started for 3 days for persistent left effusion 6/01 Afebrile, low-dose Levophed, Precedex, Minimal output on chest tube- pulmozyme / tpa x 2,  6/02 pulmozyme and TPA completed 3 days 6/03 afib with RVR placed on amio gtt overnight , 1U PRBC for Hb 7.1  6/05 extubated successfully, Repeat TPA/Pulmonzyme given again  6/06 acute decompensation overnight, hemorrhagic shock >to OR for left VATS with 2L hemothorax drained. CT head with new ICH>neurosurgery  consulted 6/09 More awake but agitated on PSV weaning  6/12: Seizures + on EEG 6/22 trach 6/29 g tube and tunneled vascular catheter placed 7/2: Care transferred to [facility]  Subjective   Patient seen and examined, lethargic this morning.  She had a Flexi-Seal earlier this morning which has been discontinued, she did have Hematochezia this am.   Assessment/Plan:    Acute respiratory failure with hypoxia s/p tracheostomy -mucous plugging, suspected aspiration, left-sided loculated effusion complicated with hemothorax status post VATS decortication.  Patient has a long hospitalization and was intubated but subsequently underwent tracheostomy on 01/06/2020.  Patient is requiring mechanical ventilation intermittently.  Wean off as able. Chest x-ray done 7/12  shows left-sided pleural effusion with mid and left lung base opacity. Also shows improved aeration in right lung fields. Will continue to monitor.    Acute kidney injury-oliguric ischemic ATN secondary to hemorrhagic shock. -  Refractory to IV diuretics, started on CRRT from 12/26/2019 6 08/23/2019.  Subsequently started on hemodialysis from 01/05/2020.  Patient is status post right IJ tunneled dialysis catheter placement by IR on 01/13/2020.  Nephrology following and managing hemodialysis.Patient is a poor candidate for long-term hemodialysis in this current state.  History of right MCA stroke-s/p thrombectomy  -with residual left-sided paralysis s/p right hemicraniectomy complicated by ICH after intrapleural TPA. -We will hold aspirin today in the setting of lower GI bleed  Dysphagia-patient without new ICH on 12/22/2019.  Manage medically as per neurosurgery recommendations.  G-tube was placed on 01/13/2020 and feeding was started.  Had some vomiting, she is currently on continuous tube feeding instead of bolus feeding .  Hematochezia -Patient with Flexi-Seal tube, possible rectal ulcer, this has been discontinued, as well holding MiraLAX,  Senokot and Colace, GI has been consulted, will hold aspirin. -Continue  to monitor H&H closely every 8 hours, and transfuse as needed.  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  EEG had shown status epilepticus.  Continue phenytoin via G-tube.  Continue Keppra.  As needed Ativan.  Chronic atrial fibrillation-patient has history of sick sinus syndrome s/p pacemaker placement.  Continue amiodarone, aspirin.  Protein calorie malnutrition-tube feed restarted at 45 cc/h.  Tolerating tube feeds well.  Diabetes mellitus type 2 with hyperglycemia and hypoglycemia episodes-continue sliding scale insulin with NovoLog.  Hyponatremia-patient has mild hyponatremia, will hold free water.  Nephrology following.  Anemia chronic disease-secondary to chronic illness hemoglobin has been stable.  Patient  received multiple PRBCs transfusions.  Will transfuse for hemoglobin 7.0.  Hypertension-blood pressure is controlled.  Overall poor prognosis.  Patient is very deconditioned and has poor prognosis.  Palliative care was consulted and has signed off.  Patient will need LTAC placement.    SpO2: 97 % O2 Flow Rate (L/min): 10 L/min FiO2 (%): 40 %     Lab Results  Component Value Date   SARSCOV2NAA NEGATIVE 10/31/2019     CBG: Recent Labs  Lab 01/27/20 1622 01/27/20 2036 01/28/20 0042 01/28/20 0452 01/28/20 0740  GLUCAP 239* 131* 127* 137* 108*    CBC: Recent Labs  Lab 01/24/20 0313 01/25/20 0238 01/26/20 0303 01/27/20 0450 01/28/20 0551  WBC 13.3* 14.0* 14.6* 17.6* 21.7*  NEUTROABS 10.1* 10.3* 11.1* 14.6* 17.5*  HGB 8.2* 8.1* 8.2* 8.5* 8.5*  HCT 27.0* 26.3* 25.9* 28.0* 27.8*  MCV 95.4 93.6 92.5 94.6 95.5  PLT 207 215 205 206 354    Basic Metabolic Panel: Recent Labs  Lab 01/24/20 0313 01/25/20 0236 01/26/20 0303 01/27/20 0450 01/28/20 0551  NA 134* 129* 128* 131* 129*  K 4.1 4.7 4.7 4.0 4.3  CL 96* 93* 91* 95* 92*  CO2 '28 24 22 24 23  ' GLUCOSE 111* 112*  110* 182* 158*  BUN 41* 67* 94* 50* 85*  CREATININE 2.63* 3.87* 4.88* 3.28* 4.52*  CALCIUM 8.1* 8.3* 8.1* 7.8* 8.2*  PHOS 4.1 6.1* 7.0* 4.3 5.4*     Liver Function Tests: Recent Labs  Lab 01/24/20 0313 01/25/20 0236 01/26/20 0303 01/27/20 0450 01/28/20 0551  ALBUMIN 1.3* 1.2* 1.3* 1.3* 1.3*     DVT prophylaxis: SCDs  Code Status: Full code  Family Communication: No family at bedside    Status is: Inpatient  Dispo: The patient is from: Home              Anticipated d/c is to: LTAC              Anticipated d/c date is: 01/29/2020              Patient currently not medically stable for discharge  Barrier to discharge-requiring hemodialysis, intermittent mechanical ventilation     Scheduled medications:  . amantadine  100 mg Per Tube BID  . amiodarone  200 mg Per Tube Daily  . aspirin  325 mg Per Tube Daily  . atorvastatin  40 mg Per Tube Daily  . chlorhexidine gluconate (MEDLINE KIT)  15 mL Mouth Rinse BID  . Chlorhexidine Gluconate Cloth  6 each Topical Q0600  . darbepoetin (ARANESP) injection - DIALYSIS  100 mcg Intravenous Q Fri-HD  . docusate  100 mg Per Tube BID  . feeding supplement (PROSource TF)  45 mL Per Tube Daily  . Gerhardt's butt cream   Topical TID  . guaiFENesin  15 mL Per Tube Q4H  . heparin sodium (porcine)      .  insulin aspart  0-20 Units Subcutaneous Q4H  . levETIRAcetam  750 mg Per Tube Q24H  . mouth rinse  15 mL Mouth Rinse 10 times per day  . multivitamin  1 tablet Per Tube QHS  . pantoprazole sodium  40 mg Per Tube QHS  . phenytoin  100 mg Per Tube Q8H  . sodium chloride flush  10-40 mL Intracatheter Q12H    Consultants:  PCCM  Palliative care  Nephrology  Neurology  Neurosurgery  IR  Procedures: 12/13/2019 thoracentesis: 1.5 L bloody fluid Bronchoscopy on 11/22/2019 ETT: 12/12/2019-01/06/2020 Tracheostomy: 01/06/2020 onwards Left pigtail chest tube 11/26/2019-12/20/2019 [facility] HD cath from 12/27/2019-01/13/2020 G-tube and  tunneled vascular catheter placement on 01/13/2020  Overnight EEG on 12/27/2019: Right frontocentral seizures x2   Antibiotics:   Anti-infectives (From admission, onward)   Start     Dose/Rate Route Frequency Ordered Stop   01/15/20 0745  ceFAZolin (ANCEF) IVPB 2g/100 mL premix  Status:  Discontinued        2 g 200 mL/hr over 30 Minutes Intravenous  Once 01/15/20 0739 01/18/20 0938   01/13/20 1645  ceFAZolin (ANCEF) IVPB 2g/100 mL premix        2 g 200 mL/hr over 30 Minutes Intravenous To Radiology 01/13/20 1630 01/13/20 1640   01/13/20 1630  ceFAZolin (ANCEF) IVPB 2g/100 mL premix  Status:  Discontinued        2 g 200 mL/hr over 30 Minutes Intravenous To ShortStay Surgical 01/13/20 1625 01/13/20 1906   01/13/20 0800  ceFAZolin (ANCEF) IVPB 2g/100 mL premix        2 g 200 mL/hr over 30 Minutes Intravenous To Radiology 01/12/20 1007 01/13/20 1836   01/12/20 1030  ceFAZolin (ANCEF) IVPB 2g/100 mL premix  Status:  Discontinued        2 g 200 mL/hr over 30 Minutes Intravenous To Radiology 01/12/20 0927 01/12/20 1007   12/27/19 1000  ceFEPIme (MAXIPIME) 2 g in sodium chloride 0.9 % 100 mL IVPB  Status:  Discontinued        2 g 200 mL/hr over 30 Minutes Intravenous Every 12 hours 12/26/19 1412 12/27/19 1017   12/26/19 1000  ceFEPIme (MAXIPIME) 1 g in sodium chloride 0.9 % 100 mL IVPB  Status:  Discontinued        1 g 200 mL/hr over 30 Minutes Intravenous Every 24 hours 12/25/19 1152 12/26/19 1412   01/13/2020 1244  ceFAZolin (ANCEF) IVPB 2g/100 mL premix  Status:  Discontinued        2 g 200 mL/hr over 30 Minutes Intravenous 30 min pre-op 01/05/2020 1244 01/04/2020 1823   01/03/2020 0600  ceFEPIme (MAXIPIME) 1 g in sodium chloride 0.9 % 100 mL IVPB  Status:  Discontinued        1 g 200 mL/hr over 30 Minutes Intravenous Every 8 hours 01/13/2020 0337 01/13/2020 0340   01/03/2020 0400  vancomycin (VANCOCIN) IVPB 1000 mg/200 mL premix        1,000 mg 200 mL/hr over 60 Minutes Intravenous  Once 12/30/2019  0337 12/28/2019 0653   01/12/2020 0400  ceFEPIme (MAXIPIME) 2 g in sodium chloride 0.9 % 100 mL IVPB  Status:  Discontinued        2 g 200 mL/hr over 30 Minutes Intravenous Every 24 hours 01/04/2020 0340 12/25/19 1152   12/20/19 2200  amoxicillin-clavulanate (AUGMENTIN) 500-125 MG per tablet 500 mg  Status:  Discontinued        1 tablet Oral 2 times daily 12/20/19 1412 12/19/2019 9390  12/20/19 1415  amoxicillin-clavulanate (AUGMENTIN) 875-125 MG per tablet 1 tablet  Status:  Discontinued        1 tablet Oral Every 12 hours 12/20/19 1407 12/20/19 1411   12/06/2019 1245  piperacillin-tazobactam (ZOSYN) IVPB 3.375 g  Status:  Discontinued        3.375 g 12.5 mL/hr over 240 Minutes Intravenous Every 8 hours 12/05/2019 1235 12/20/19 1407       Objective   Vitals:   01/28/20 0900 01/28/20 0915 01/28/20 0930 01/28/20 1000  BP: (!) 142/51 110/60 (!) 114/58 (!) 161/65  Pulse: 74 74 73 79  Resp: (!) 30 (!) 32 (!) 28 (!) 32  Temp:      TempSrc:      SpO2: 99%   97%  Weight:      Height:        Intake/Output Summary (Last 24 hours) at 01/28/2020 1038 Last data filed at 01/28/2020 0800 Gross per 24 hour  Intake 685 ml  Output 325 ml  Net 360 ml    07/12 1901 - 07/14 0700 In: 1225  Out: 125   Filed Weights   01/27/20 0500 01/28/20 0500 01/28/20 0700  Weight: 54.9 kg 55.3 kg 55.3 kg    Physical Examination:  Patient does awake up, she appears altered. Symmetrical Chest wall movement, Good air movement bilaterally, CTAB RRR,No Gallops,Rubs or new Murmurs, No Parasternal Heave +ve B.Sounds, Abd Soft, No tenderness, No rebound - guarding or rigidity. No Cyanosis, Clubbing or edema, No new Rash or bruise     Data Reviewed:   No results found for this or any previous visit (from the past 240 hour(s)).  No results for input(s): LIPASE, AMYLASE in the last 168 hours. No results for input(s): AMMONIA in the last 168 hours.  Cardiac Enzymes: No results for input(s): CKTOTAL, CKMB,  CKMBINDEX, TROPONINI in the last 168 hours. BNP (last 3 results) Recent Labs    11/11/19 2226  BNP 93.9    Studies:  No results found.     Fraidy Mccarrick   [facility] If 7PM-7AM, please contact night-coverage at [email or web address], Office  [phone]   01/28/2020, 10:38 AM  LOS: 45 days

## 2020-01-28 NOTE — Consult Note (Addendum)
[facility] Consult: 8:41 AM 01/28/2020  LOS: 45 days    Referring Provider: Dr Waldron Labs Primary Care Physician:  Eulis Foster, MD Primary Gastroenterologist:  Dr. Althia Forts    Reason for Consultation:  Hematochezia, flexi seal in place.     HPI: COURTENEY ALDERETE is a 64 y.o. female.   Chronic a fib on Coumadin at admission.  DM.  S/p MV ring repair and pacemaker.    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  4/18 craniotomy.  5/6 discharge to inpt rehab w cortrack FT tube in place for dysphagia.  5/16 readmitted to inpt  w SOB, bloody pleural effusion, 5/29 thoracentesis then bronch and chest tube placement.  Became septic grew MRSA from trach 01/14/20.  AKI progressed to ESRD, CVVHD began 6/11, HD began 6/21.  Required vent support.  6/6 VATS/bronchoscopy/hemothorax drainage.  6/6 head CT w new CNS hemorrhaging, no neurosurgery intervention.   New seizures.  6/29 perc gastrostomy tube per IR. S/p trach.  Passy-muir valve in place.  Latest BS swallow eval 7/13 says severe aspiration risk, remains NPO. CTAP of 6/25 shows nodular liver, ? Cirrhosis, no ascites. skull flap implanted in R ant abdomen. Small fat containing peri-umbilical hernia.  Tip of FT at near prox duodenum.        Overnight passing clots of blood PR.  Flexiseal was in place since early June, removed this AM.  RN reports some small amount of blood into flexiseal periodically, nothing dramatic.  Vital signs stable.   Hgb 8.5. range was 7.9 - 8.2 in the previous week.   Platelets 215, were 131 nine d ago.  INR 1.4.  LFTs abnormal: t bili 0.7, alk phos 860 (966 on 6/22), AST/ALT 189/145 on 7/6 (335/150 on 6/8), trending better as of last assay 7/7.     On full dose ASA, no other thinners.       No  records of prior colonoscopy  Married.  Intake records mention that she drank a glass or 2 of wine about once a month.  Social history Married.  Past Medical History:  Diagnosis Date  . Chronic atrial fibrillation ([place]) 1990s   s/p DCCV then attempted ablation of complex A Flutter (@ [facility] - Dr. Deno Etienne), failed antiarrhythmics --> INR folllwed @ [facility] ->> now status post pacemaker placement with underlying A. fib.  . Diabetes mellitus type 2, uncontrolled, without complications    On Oral Medications ([place] [facility])  . Essential hypertension   . History of cardiac catheterization 2001   R&LHC - normal Coronaries, no evidence of Restictive Cardiomyopathy or Constrictive Pericarditis (also @ [facility])  . Hx of sick sinus syndrome 07/1999   Wtih symptomatic bradycardia - syncope (Tachy-Brady)  . 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 CARDIAC CATHETERIZATION  08/03/1999   [place] - normal Coronaries; no sign of constriction or restrictive cardiomypathy  . CRANIECTOMY FOR DEPRESSED SKULL FRACTURE Right 11/02/2019   Procedure: DECOMPRESSIVE HEMI -CRANIECTOMY, IMPLANTATION OF SKULL FLAP TO RIGHT ABDOMEN;  Surgeon: Consuella Lose, MD;  Location: [facility];  Service: Neurosurgery;  Laterality: Right;  . IR CT HEAD LTD  10/31/2019  . IR FLUORO GUIDE CV LINE RIGHT  01/13/2020  . IR GASTROSTOMY TUBE MOD SED  01/13/2020  . IR PERCUTANEOUS ART THROMBECTOMY/INFUSION INTRACRANIAL INC DIAG ANGIO  10/31/2019      . IR PERCUTANEOUS ART THROMBECTOMY/INFUSION INTRACRANIAL INC DIAG ANGIO  10/31/2019  .  IR US GUIDE VASC ACCESS RIGHT  01/13/2020  . MITRAL VALVE REPAIR  07/1999   Both Ant& Post leaflet repair - quadrangular resection&caudal transposition, #32 Sequin Annuloplasty ring  . PACEMAKER GENERATOR CHANGE  11/26/2008   Medtronic Adapta L(? if it has been checked since 02/2013)  . PACEMAKER INSERTION  07/1999   for SSS in setting of Chronic Afib; Medtronic Kappa [id number], [id number].  Marland Kitchen RADIOLOGY WITH ANESTHESIA N/A 10/31/2019   Procedure: IR WITH ANESTHESIA;  Surgeon: Radiologist, Medication, MD;  Location: [facility];  Service: Radiology;  Laterality: N/A;  . TRANSTHORACIC ECHOCARDIOGRAM  05/2018   Normal LV size and thickness.  EF estimated 50%.  Inferobasal HK and flattened septum c/w with elevated PA pressures. Mild functional mitral stenosis at rest (postoperative).  Moderate left atrial dilation and mild right atrial dilation.  Mean PA pressures estimated 52 mmHg.  Marland Kitchen VIDEO ASSISTED THORACOSCOPY (VATS)/THOROCOTOMY Left 01/14/2020   Procedure: Video Assisted Thoracoscopy (Vats) left , Drainage of Hemothorax;  Surgeon: Ivin Poot, MD;  Location: [facility];  Service: Thoracic;  Laterality: Left;  Marland Kitchen VIDEO BRONCHOSCOPY N/A 12/25/2019   Procedure: Video Bronchoscopy;  Surgeon: Ivin Poot, MD;  Location: [facility];  Service: Thoracic;  Laterality: N/A;    Prior to Admission medications   Medication Sig Start Date End Date Taking? Authorizing Provider  acetaminophen (TYLENOL) 500 MG tablet Take 500-1,000 mg by mouth every 6 (six) hours as needed for headache (pain).    [provider]  amantadine (SYMMETREL) 50 MG/5ML solution Place 10 mLs (100 mg total) into feeding tube 2 (two) times daily. 11/20/19   Donzetta Starch, NP  Amino Acids-Protein Hydrolys (FEEDING SUPPLEMENT, PRO-STAT SUGAR FREE 64,) LIQD Take 30 mLs by mouth 2 (two) times daily. 11/20/19   Donzetta Starch, NP  aspirin 325 MG tablet Place 1 tablet (325 mg total) into feeding tube daily. 11/21/19   Donzetta Starch, NP  atorvastatin  (LIPITOR) 40 MG tablet Place 1 tablet (40 mg total) into feeding tube daily. 11/21/19   Donzetta Starch, NP  Chlorhexidine Gluconate Cloth 2 % PADS Apply 6 each topically daily at 6 (six) AM. 11/21/19   Donzetta Starch, NP  chlorhexidine gluconate, MEDLINE KIT, (PERIDEX) 0.12 % solution 15 mLs by Mouth Rinse route 2 (two) times daily. 11/20/19   Donzetta Starch, NP  diltiazem (CARDIZEM) 10 mg/ml oral suspension Place 6 mLs (60 mg total) into feeding tube every 6 (six) hours. 11/20/19   Donzetta Starch, NP  enoxaparin (LOVENOX) 40 MG/0.4ML injection Inject 0.4 mLs (40 mg total) into the skin daily. 11/20/19   Donzetta Starch, NP  insulin aspart (NOVOLOG) 100 UNIT/ML injection Inject 0-9 Units  into the skin 3 (three) times daily with meals. 11/20/19   Donzetta Starch, NP  insulin aspart (NOVOLOG) 100 UNIT/ML injection Inject 8 Units into the skin 2 (two) times daily. 11/20/19   Donzetta Starch, NP  insulin glargine (LANTUS) 100 UNIT/ML injection Inject 0.2 mLs (20 Units total) into the skin 2 (two) times daily. 11/20/19   Donzetta Starch, NP  Maltodextrin-Xanthan Gum (RESOURCE THICKENUP CLEAR) POWD Take 120 g by mouth as needed (nectar thick liquids). 11/20/19   Donzetta Starch, NP  metoprolol tartrate (LOPRESSOR) 25 MG tablet Place 1 tablet (25 mg total) into feeding tube 2 (two) times daily. 11/20/19   Donzetta Starch, NP  Nutritional Supplements (FEEDING SUPPLEMENT, GLUCERNA 1.5 CAL,) LIQD Place 500 mLs into feeding tube daily. 11/20/19   Donzetta Starch, NP  polyethylene glycol (MIRALAX / GLYCOLAX) 17 g packet Place 17 g into feeding tube 2 (two) times daily. 11/20/19   Donzetta Starch, NP  senna-docusate (SENOKOT-S) 8.6-50 MG tablet Place 1 tablet into feeding tube at bedtime as needed for mild constipation. 11/20/19   Donzetta Starch, NP  senna-docusate (SENOKOT-S) 8.6-50 MG tablet Place 1 tablet into feeding tube 2 (two) times daily. 11/20/19   Donzetta Starch, NP    Scheduled Meds: . amantadine  100 mg Per Tube BID  . amiodarone  200  mg Per Tube Daily  . aspirin  325 mg Per Tube Daily  . atorvastatin  40 mg Per Tube Daily  . chlorhexidine gluconate (MEDLINE KIT)  15 mL Mouth Rinse BID  . Chlorhexidine Gluconate Cloth  6 each Topical Q0600  . darbepoetin (ARANESP) injection - DIALYSIS  100 mcg Intravenous Q Fri-HD  . docusate  100 mg Per Tube BID  . feeding supplement (PROSource TF)  45 mL Per Tube Daily  . Gerhardt's butt cream   Topical TID  . guaiFENesin  15 mL Per Tube Q4H  . heparin sodium (porcine)      . insulin aspart  0-20 Units Subcutaneous Q4H  . levETIRAcetam  750 mg Per Tube Q24H  . mouth rinse  15 mL Mouth Rinse 10 times per day  . multivitamin  1 tablet Per Tube QHS  . pantoprazole sodium  40 mg Per Tube QHS  . phenytoin  100 mg Per Tube Q8H  . polyethylene glycol  17 g Per Tube TID  . sennosides  5 mL Per Tube BID  . sodium chloride flush  10-40 mL Intracatheter Q12H   Infusions: . sodium chloride Stopped (12/30/19 2139)  . sodium chloride    . sodium chloride    . feeding supplement (NEPRO CARB STEADY) 1,000 mL (01/27/20 0903)   PRN Meds: sodium chloride, sodium chloride, sodium chloride, acetaminophen, alteplase, bisacodyl, fentaNYL (SUBLIMAZE) injection, heparin, hydrALAZINE, lidocaine (PF), lidocaine-prilocaine, loperamide, midazolam, ondansetron (ZOFRAN) IV, oxyCODONE, pentafluoroprop-tetrafluoroeth, senna-docusate, sodium chloride flush   Allergies as of 12/10/2019  . (No Known Allergies)    Family History  Problem Relation Age of Onset  . Hypertension Mother   . Diabetes Sister   . Cancer Brother        Rectal  . Lung cancer Brother     Social History   Socioeconomic History  . Marital status: Married    Spouse name: Riki Rusk   . Number of children: 2  . Years of education: 16 years.  . Highest education level: Not on file  Occupational History  . Occupation: product Actor: [employer]  Comment: prev worked as Tax inspector   Tobacco Use  . Smoking status: Never Smoker  . Smokeless tobacco: Never Used  Vaping Use  . Vaping Use: Never used  Substance and Sexual Activity  . Alcohol use: Yes    Alcohol/week: 0.0 standard drinks    Comment: occasional, once every month or two- glass of wine   . Drug use: No  . Sexual activity: Not Currently    Partners: Male    Comment: 1st intercourse 38 yo-5 partners  Other Topics Concern  . Not on file  Social History Narrative   Lives with husband, great-niece (23)  and great-nephew (1).    Daughter 98  yo 4th year college basketball player in [place].   1 son died in an automobile accident @ age 85 (2010).    She raised several nieces & nephews - now lives with her 88 y/o daughter (who recently moved back in)   Routinely walks 2-3 days/week.   Started new job through the [employer].   Social Determinants of Health   Financial Resource Strain:   . Difficulty of Paying Living Expenses:   Food Insecurity:   . Worried About Charity fundraiser in the Last Year:   . Arboriculturist in the Last Year:   Transportation Needs:   . Film/video editor (Medical):   Marland Kitchen Lack of Transportation (Non-Medical):   Physical Activity:   . Days of Exercise per Week:   . Minutes of Exercise per Session:   Stress:   . Feeling of Stress :   Social Connections:   . Frequency of Communication with Friends and Family:   . Frequency of Social Gatherings with Friends and Family:   . Attends Religious Services:   . Active Member of Clubs or Organizations:   . Attends Archivist Meetings:   Marland Kitchen Marital Status:   Intimate Partner Violence:   . Fear of Current or Ex-Partner:   . Emotionally Abused:   Marland Kitchen Physically Abused:   . Sexually Abused:     REVIEW OF SYSTEMS: Unable to obtain a review of systems in this patient who is unable to speak   PHYSICAL EXAM: Vital signs in last 24 hours: Vitals:   01/28/20 0815 01/28/20 0830  BP: (!) 124/56 (!)  99/41  Pulse: 72 71  Resp: (!) 27 (!) 36  Temp:    SpO2:     Wt Readings from Last 3 Encounters:  01/28/20 55.3 kg  12/13/19 63.9 kg  11/20/19 66 kg    General: Cachectic, chronically ill appearing patient with trach collar in place. Head: Large deficit on right scalp consistent with removal of skull bone. Eyes: No conjunctival pallor.  No scleral icterus. Ears: Is able to hear me Nose: No congestion or discharge Mouth: Good dentition.  Oral mucosa moist, pink, clear.  Unable to fully open her mouth for extensive exam.  No blood in the mouth. Neck: Trach in place with thick opaque secretions Lungs: No labored breathing.  Lungs clear by anterior exam Heart: RRR. Abdomen: Thin, soft.  Skull bone palpable in right abdomen.  G-tube positioned in left mid to upper abdomen, no bleeding or erythema at the site.  Soft polypoid mass about 2 cm across positioned in the upper left thigh. Rectal: Mucoid bloody stool staining the bed linens.  External hemorrhoids not thrombosed or bleeding.  No palpable rectal masses or stool ball, bloody and soft mucoid brown stool on  glove Musc/Skeltl: No joint redness, swelling. Extremities: No CCE.  Sarcopenia with thin, muscle wasted limbs and trunk. Neurologic:  Left sided flaccid paralysis.   Tremor in right hand.   Skin:  Healing, scabbed over sore on right forarm   Intake/Output from previous day: 07/13 0701 - 07/14 0700 In: 820 [NG/GT:720] Out: 125 [Stool:125] Intake/Output this shift: No intake/output data recorded.  LAB RESULTS: Recent Labs    01/26/20 0303 01/27/20 0450 01/28/20 0551  WBC 14.6* 17.6* 21.7*  HGB 8.2* 8.5* 8.5*  HCT 25.9* 28.0* 27.8*  PLT 205 206 224   BMET Lab Results  Component Value Date   NA 129 (L) 01/28/2020   NA 131 (L) 01/27/2020   NA 128 (L) 01/26/2020   K 4.3 01/28/2020   K 4.0 01/27/2020   K 4.7 01/26/2020   CL 92 (L) 01/28/2020   CL 95 (L) 01/27/2020   CL 91 (L) 01/26/2020   CO2 23 01/28/2020    CO2 24 01/27/2020   CO2 22 01/26/2020   GLUCOSE 158 (H) 01/28/2020   GLUCOSE 182 (H) 01/27/2020   GLUCOSE 110 (H) 01/26/2020   BUN 85 (H) 01/28/2020   BUN 50 (H) 01/27/2020   BUN 94 (H) 01/26/2020   CREATININE 4.52 (H) 01/28/2020   CREATININE 3.28 (H) 01/27/2020   CREATININE 4.88 (H) 01/26/2020   CALCIUM 8.2 (L) 01/28/2020   CALCIUM 7.8 (L) 01/27/2020   CALCIUM 8.1 (L) 01/26/2020   LFT Recent Labs    01/26/20 0303 01/27/20 0450 01/28/20 0551  ALBUMIN 1.3* 1.3* 1.3*   PT/INR Lab Results  Component Value Date   INR 1.4 (H) 01/06/2020   INR 1.1 12/22/2019   INR 0.9 12/23/2019   Hepatitis Panel No results for input(s): HEPBSAG, HCVAB, HEPAIGM, HEPBIGM in the last 72 hours. C-Diff No components found for: CDIFF Lipase  No results found for: LIPASE  Drugs of Abuse     Component Value Date/Time   LABOPIA NONE DETECTED 10/31/2019 1820   COCAINSCRNUR NONE DETECTED 10/31/2019 1820   LABBENZ NONE DETECTED 10/31/2019 1820   AMPHETMU NONE DETECTED 10/31/2019 1820   THCU NONE DETECTED 10/31/2019 1820   LABBARB NONE DETECTED 10/31/2019 1820     RADIOLOGY STUDIES: DG CHEST PORT 1 VIEW  Result Date: 01/26/2020 CLINICAL DATA:  Hypoxia EXAM: PORTABLE CHEST 1 VIEW COMPARISON:  January 21, 2020 FINDINGS: Tracheostomy catheter tip is 4.8 cm above the carina. Dual lumen central catheter has its tip in the superior vena cava near the cavoatrial junction. Left peripherally inserted central catheter has its tip in the superior vena cava. Pacemaker lead attached to right ventricle. Skin fold noted on the left. No pneumothorax. There is a moderate pleural effusion on the left, partially loculated, with atelectatic change in the left base. Hazy opacity left upper lobe is stable and likely represents loculated effusion. Right lung is clear. Heart is mildly enlarged with pulmonary vascularity normal. Patient is status post median sternotomy. IMPRESSION: Tube and catheter positions as described  without pneumothorax. Loculated pleural effusion on the left with left base atelectasis. Opacity left upper lobe likely represents another focus of loculated fluid. Mild airspace opacity superimposed in this area is possible. Right lung is clear. Stable cardiac silhouette. Electronically Signed   By: Lowella Grip III M.D.   On: 01/26/2020 08:51     IMPRESSION:   *    Acute hematochezia in pt w Flexiseal in place, now removed.  ? Rectal ulcer, ? AVM, ? Neoplasm, ? Internal hemrrhoids, ? colitis   *  Prolonged admission starting 4/16 with interim in inpt rehab for complicated ischemic to hemorrhagic CVA.    *   Hemothorax, resp failure.  S/p VATS, s/p trach  *   Neurogenic dysphagia.  NPO.  IR place G tube 6/29.    *    Possible cirrhosis.  Nodular liver per CT, intermittent thrombocytopenia, mild elevation INR.   Alk phos especially, wonder if the cranial bone in abdomen is playing some sort of role in addition to her now HD dependent renal failure.       PLAN:     *   Per Dr Carlean Purl.  ? Flex sig vs observation off flexiseal and ASA.    *   Stop Miralax, note senokot was stopped today   Azucena Freed  01/28/2020, 8:41 AM Phone [place] Attending   I have taken an interval history, reviewed the chart and examined the patient. I agree with the Advanced Practitioner's note, impression and recommendations.    With rectal tube out less bleeding per RN  However she also has bleeding around tracheostomy  Leave rectal tube out  Check coags  As long as she is improving and hopefully resolves will not plan to scope.  Gatha Mayer, MD, [facility] 01/28/2020 6:55 PM

## 2020-01-28 NOTE — Progress Notes (Signed)
Patient ID: Krystal Little, female   DOB: 31-Dec-1955, 64 y.o.   MRN: [id number] S:  Awake and interactive.  Hemotochezia this AM, flexi seal out.  Husband bedside.  Tolerated HD this AM.   O:BP (!) 105/58   Pulse 86   Temp 97.6 F (36.4 C)   Resp (!) 22   Ht '5\' 4"'  (1.626 m)   Wt 55.3 kg   SpO2 100%   BMI 20.93 kg/m   Intake/Output Summary (Last 24 hours) at 01/28/2020 1337 Last data filed at 01/28/2020 1015 Gross per 24 hour  Intake 670 ml  Output 325 ml  Net 345 ml   Intake/Output: I/O last 3 completed shifts: In: 1225 [Other:100; NG/GT:1125] Out: 125 [Stool:125]  Intake/Output this shift:  Total I/O In: 120 [Other:120] Out: 200 [Stool:200] Weight change: 0.4 kg Gen: NAD, answering simple questions with head shakes CVS: RRR, no rub Resp: occ rhonchi Abd: benign Ext: no edema.  Recent Labs  Lab 01/22/20 0500 01/23/20 0507 01/24/20 0313 01/25/20 0236 01/26/20 0303 01/27/20 0450 01/28/20 0551  NA 133* 132* 134* 129* 128* 131* 129*  K 4.2 4.3 4.1 4.7 4.7 4.0 4.3  CL 96* 95* 96* 93* 91* 95* 92*  CO2 '27 25 28 24 22 24 23  ' GLUCOSE 140* 98 111* 112* 110* 182* 158*  BUN 43* 75* 41* 67* 94* 50* 85*  CREATININE 2.87* 4.13* 2.63* 3.87* 4.88* 3.28* 4.52*  ALBUMIN 1.3* 1.2* 1.3* 1.2* 1.3* 1.3* 1.3*  CALCIUM 8.4* 8.3* 8.1* 8.3* 8.1* 7.8* 8.2*  PHOS 4.6 5.0* 4.1 6.1* 7.0* 4.3 5.4*   Liver Function Tests: Recent Labs  Lab 01/26/20 0303 01/27/20 0450 01/28/20 0551  ALBUMIN 1.3* 1.3* 1.3*   No results for input(s): LIPASE, AMYLASE in the last 168 hours. No results for input(s): AMMONIA in the last 168 hours. CBC: Recent Labs  Lab 01/24/20 0313 01/24/20 0313 01/25/20 0238 01/25/20 0238 01/26/20 0303 01/27/20 0450 01/28/20 0551  WBC 13.3*   < > 14.0*   < > 14.6* 17.6* 21.7*  NEUTROABS 10.1*   < > 10.3*   < > 11.1* 14.6* 17.5*  HGB 8.2*   < > 8.1*   < > 8.2* 8.5* 8.5*  HCT 27.0*   < > 26.3*   < > 25.9* 28.0* 27.8*  MCV 95.4  --  93.6  --  92.5 94.6 95.5  PLT  207   < > 215   < > 205 206 224   < > = values in this interval not displayed.   Cardiac Enzymes: No results for input(s): CKTOTAL, CKMB, CKMBINDEX, TROPONINI in the last 168 hours. CBG: Recent Labs  Lab 01/27/20 2036 01/28/20 0042 01/28/20 0452 01/28/20 0740 01/28/20 1127  GLUCAP 131* 127* 137* 108* 101*    Iron Studies: No results for input(s): IRON, TIBC, TRANSFERRIN, FERRITIN in the last 72 hours. Studies/Results: No results found. Marland Kitchen amantadine  100 mg Per Tube BID  . amiodarone  200 mg Per Tube Daily  . atorvastatin  40 mg Per Tube Daily  . chlorhexidine gluconate (MEDLINE KIT)  15 mL Mouth Rinse BID  . Chlorhexidine Gluconate Cloth  6 each Topical Q0600  . darbepoetin (ARANESP) injection - DIALYSIS  100 mcg Intravenous Q Fri-HD  . docusate  100 mg Per Tube BID  . feeding supplement (PROSource TF)  45 mL Per Tube Daily  . Gerhardt's butt cream   Topical TID  . guaiFENesin  15 mL Per Tube Q4H  . insulin aspart  0-20  Units Subcutaneous Q4H  . levETIRAcetam  750 mg Per Tube Q24H  . mouth rinse  15 mL Mouth Rinse 10 times per day  . multivitamin  1 tablet Per Tube QHS  . pantoprazole sodium  40 mg Per Tube QHS  . phenytoin  100 mg Per Tube Q8H  . sodium chloride flush  10-40 mL Intracatheter Q12H    BMET    Component Value Date/Time   NA 129 (L) 01/28/2020 0551   NA 140 08/18/2019 1120   K 4.3 01/28/2020 0551   CL 92 (L) 01/28/2020 0551   CO2 23 01/28/2020 0551   GLUCOSE 158 (H) 01/28/2020 0551   BUN 85 (H) 01/28/2020 0551   BUN 12 08/18/2019 1120   CREATININE 4.52 (H) 01/28/2020 0551   CREATININE 0.73 05/18/2016 0919   CALCIUM 8.2 (L) 01/28/2020 0551   GFRNONAA 10 (L) 01/28/2020 0551   GFRNONAA 89 05/18/2016 0919   GFRAA 11 (L) 01/28/2020 0551   GFRAA >89 05/18/2016 0919   CBC    Component Value Date/Time   WBC 21.7 (H) 01/28/2020 0551   RBC 2.91 (L) 01/28/2020 0551   HGB 8.5 (L) 01/28/2020 0551   HGB 15.2 08/18/2019 1120   HCT 27.8 (L) 01/28/2020  0551   HCT 45.9 08/18/2019 1120   PLT 224 01/28/2020 0551   PLT 168 08/18/2019 1120   MCV 95.5 01/28/2020 0551   MCV 94 08/18/2019 1120   MCH 29.2 01/28/2020 0551   MCHC 30.6 01/28/2020 0551   RDW 19.2 (H) 01/28/2020 0551   RDW 12.2 08/18/2019 1120   LYMPHSABS 2.0 01/28/2020 0551   MONOABS 1.4 (H) 01/28/2020 0551   EOSABS 0.5 01/28/2020 0551   BASOSABS 0.1 01/28/2020 0551    Assessment/Plan:  1. AKI- due to ischemic ATN in setting of hemorrhagic shock requiring pressor support and initiated on CRRT 6/11-6/20/21 and transitioned to IHD on 6/21 and has remained oliguric and dialysis dependent. 1. Will continue with HD on MWF schedule for now. 2. Has been tolerating HD  3. No signs of renal recovery at this time. 4. Next HD Friday, plan 2-3L as tolerated 2. Left sided hemothorax- s/p VATS on 6/6 and s/p removal of chest tubes 3. RMCA stroke- s/p thrombectomy 4/16 c/b worsening cerebral edema with mass effect s/p decompressive right hemicraniectomy and placement of bone flap on 11/02/19, new ICH 01/05/2020. 4. VDRF- s/p trach 6/22/2, tol TC now. 5. Chronic atrial fibrillation- on amio and asa 6. HTN- stable; BPs quite variable 7. Seizure disorder- per neuro 8. Nutrition- s/p PEG by IR on 6/29 9. ABLA/Anemia of critical illness- s/p multiple transfusions 10. Hyponatremia - noted 7/12; improved with HD and stopping FWF via tube.  Na 129 today, CTM post HD. 11. Disposition- will need LTAC but poor overall prognosis. Continue with ongoing discussions with family regarding poor prognosis for meaningful recovery.  Jannifer Hick MD [facility] Pager [phone]

## 2020-01-29 DIAGNOSIS — K921 Melena: Secondary | ICD-10-CM

## 2020-01-29 LAB — CBC WITH DIFFERENTIAL/PLATELET
Abs Immature Granulocytes: 0.38 10*3/uL — ABNORMAL HIGH (ref 0.00–0.07)
Basophils Absolute: 0.1 10*3/uL (ref 0.0–0.1)
Basophils Relative: 1 %
Eosinophils Absolute: 0.5 10*3/uL (ref 0.0–0.5)
Eosinophils Relative: 2 %
HCT: 28.4 % — ABNORMAL LOW (ref 36.0–46.0)
Hemoglobin: 8.5 g/dL — ABNORMAL LOW (ref 12.0–15.0)
Immature Granulocytes: 2 %
Lymphocytes Relative: 11 %
Lymphs Abs: 2.8 10*3/uL (ref 0.7–4.0)
MCH: 28.7 pg (ref 26.0–34.0)
MCHC: 29.9 g/dL — ABNORMAL LOW (ref 30.0–36.0)
MCV: 95.9 fL (ref 80.0–100.0)
Monocytes Absolute: 2.2 10*3/uL — ABNORMAL HIGH (ref 0.1–1.0)
Monocytes Relative: 9 %
Neutro Abs: 18.1 10*3/uL — ABNORMAL HIGH (ref 1.7–7.7)
Neutrophils Relative %: 75 %
Platelets: 241 10*3/uL (ref 150–400)
RBC: 2.96 MIL/uL — ABNORMAL LOW (ref 3.87–5.11)
RDW: 19.5 % — ABNORMAL HIGH (ref 11.5–15.5)
WBC: 24.1 10*3/uL — ABNORMAL HIGH (ref 4.0–10.5)
nRBC: 0.1 % (ref 0.0–0.2)

## 2020-01-29 LAB — RENAL FUNCTION PANEL
Albumin: 1.5 g/dL — ABNORMAL LOW (ref 3.5–5.0)
Anion gap: 13 (ref 5–15)
BUN: 46 mg/dL — ABNORMAL HIGH (ref 8–23)
CO2: 26 mmol/L (ref 22–32)
Calcium: 8.4 mg/dL — ABNORMAL LOW (ref 8.9–10.3)
Chloride: 93 mmol/L — ABNORMAL LOW (ref 98–111)
Creatinine, Ser: 3.04 mg/dL — ABNORMAL HIGH (ref 0.44–1.00)
GFR calc Af Amer: 18 mL/min — ABNORMAL LOW (ref >60)
GFR calc non Af Amer: 15 mL/min — ABNORMAL LOW (ref >60)
Glucose, Bld: 133 mg/dL — ABNORMAL HIGH (ref 70–99)
Phosphorus: 4.1 mg/dL (ref 2.5–4.6)
Potassium: 4.1 mmol/L (ref 3.5–5.1)
Sodium: 132 mmol/L — ABNORMAL LOW (ref 135–145)

## 2020-01-29 LAB — PROTIME-INR
INR: 1.4 — ABNORMAL HIGH (ref 0.8–1.2)
Prothrombin Time: 16.8 seconds — ABNORMAL HIGH (ref 11.4–15.2)

## 2020-01-29 LAB — GLUCOSE, CAPILLARY
Glucose-Capillary: 126 mg/dL — ABNORMAL HIGH (ref 70–99)
Glucose-Capillary: 149 mg/dL — ABNORMAL HIGH (ref 70–99)
Glucose-Capillary: 169 mg/dL — ABNORMAL HIGH (ref 70–99)
Glucose-Capillary: 184 mg/dL — ABNORMAL HIGH (ref 70–99)
Glucose-Capillary: 191 mg/dL — ABNORMAL HIGH (ref 70–99)
Glucose-Capillary: 219 mg/dL — ABNORMAL HIGH (ref 70–99)
Glucose-Capillary: 95 mg/dL (ref 70–99)

## 2020-01-29 LAB — APTT: aPTT: 35 seconds (ref 24–36)

## 2020-01-29 MED ORDER — HYDROCOD POLST-CPM POLST ER 10-8 MG/5ML PO SUER
10.0000 mg | Freq: Two times a day (BID) | ORAL | Status: DC
  Administered 2020-01-29 – 2020-02-01 (×5): 5 mL via ORAL
  Filled 2020-01-29 (×6): qty 5

## 2020-01-29 NOTE — Progress Notes (Signed)
Pt had large emesis of tube feeds induced by gagging/coughing. Paged MD. Holding TF.

## 2020-01-29 NOTE — Progress Notes (Signed)
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  On 12/13/2019, PCCM was consulted for tachypnea and enlarging left-sided pleural effusion, thoracentesis showed hemothorax.  CT of the chest showed obstruction of her left mainstem bronchus and loculated left pleural effusion.  She was admitted  to ICU under PCCM service on 11/27/2019 for chest tube placement and bronchoscopy.  She underwent intrapleural TPA but required VATS eventually.  Her course was complicated by ICH, seizures, AKI, requiring CRRT.  Patient subsequently had tracheostomy placed on 12/22/2019, NG tube and tunneled vascular catheter placed on 01/13/2020.  Subsequently patient was started on intermittent hemodialysis.  Significant Hospital events 4/16 IR thrombectomy R MCA stroke 5/6 transfer to [facility] Inpatient Rehab 5/29 PCCM consult left pleural effusion, thoracentesis 5/30 worsening tachypnea. Moved to ICU for intubation to facilitate bronchoscopy and chest tube placement 5/31 TPA/ pulmonzyme started for 3 days for persistent left effusion 6/01 Afebrile, low-dose Levophed, Precedex, Minimal output on chest tube- pulmozyme / tpa x 2,  6/02 pulmozyme and TPA completed 3 days 6/03 afib with RVR placed on amio gtt overnight , 1U PRBC for Hb 7.1  6/05 extubated successfully, Repeat TPA/Pulmonzyme given again  6/06 acute decompensation overnight, hemorrhagic shock >to OR for left VATS with 2L hemothorax drained. CT head with new ICH>neurosurgery  consulted 6/09 More awake but agitated on PSV weaning  6/12: Seizures + on EEG 6/22 trach 6/29 g tube and tunneled vascular catheter placed 7/2: Care transferred to [facility]  Subjective   Patient seen and examined, she had a small episode of vomiting x2 yesterday, otherwise no significant events, no further blood with her bowel movements as discussed with staff.    Assessment/Plan:    Acute respiratory failure with hypoxia s/p tracheostomy -mucous plugging, suspected aspiration, left-sided loculated effusion complicated with hemothorax status post VATS decortication.   -Patient has a long hospitalization and was intubated but subsequently underwent tracheostomy on 01/06/2020.  Patient is requiring mechanical ventilation intermittently.  Wean off as able. Chest x-ray done 7/12  shows left-sided pleural effusion with mid and left lung base opacity. Also shows improved aeration in right lung fields. Will continue to monitor.  He has been off the vent for last 48 hours.    Acute kidney injury-oliguric ischemic ATN secondary to hemorrhagic shock. -  Refractory to IV diuretics, started on CRRT from 12/26/2019 6 08/23/2019.  Subsequently started on hemodialysis from 01/05/2020.  Patient is status post right IJ tunneled dialysis catheter placement by IR on 01/13/2020.  Nephrology following and managing hemodialysis. - Patient is a poor candidate for long-term hemodialysis in this current state.  History of right MCA stroke-s/p thrombectomy  -with residual left-sided paralysis s/p right hemicraniectomy complicated by ICH after intrapleural TPA. -Aspirin on hold in the setting of lower GI bleed, to resume once stable.  Dysphagia-patient without new ICH on 12/28/2019.  Manage medically as per neurosurgery recommendations.  G-tube was placed on 01/13/2020 and feeding was started.  Had some vomiting, she is currently on continuous tube feeding instead of bolus feeding .  Hematochezia -Patient with Flexi-Seal  tube, possible rectal  ulcer, this has been discontinued, her aspirin has been stopped, GI input greatly appreciated . -H&H remained stable, continue to monitor closely .  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  EEG had shown status epilepticus.  Continue phenytoin via G-tube.  Continue Keppra.  As needed Ativan.  Chronic atrial fibrillation-patient has history of sick sinus syndrome s/p pacemaker placement.  Continue amiodarone, aspirin.  Protein calorie malnutrition-tube feed restarted at 45 cc/h.  Tolerating tube feeds well.  Diabetes mellitus type 2 with hyperglycemia and hypoglycemia episodes-continue sliding scale insulin with NovoLog.  Hyponatremia-patient has mild hyponatremia, will hold free water.  Nephrology following.  Anemia chronic disease-secondary to chronic illness hemoglobin has been stable.  Patient  received multiple PRBCs transfusions.  Will transfuse for hemoglobin 7.0.  Hypertension-blood pressure is controlled.  Overall poor prognosis.  Patient is very deconditioned and has poor prognosis.  Palliative care was consulted and has signed off.  Patient will need LTAC placement.    SpO2: 97 % O2 Flow Rate (L/min): 10 L/min FiO2 (%): 40 %     Lab Results  Component Value Date   SARSCOV2NAA NEGATIVE 10/31/2019     CBG: Recent Labs  Lab 01/28/20 1552 01/28/20 2055 01/29/20 0039 01/29/20 0526 01/29/20 0831  GLUCAP 187* 173* 169* 126* 219*    CBC: Recent Labs  Lab 01/25/20 0238 01/25/20 0238 01/26/20 0303 01/26/20 0303 01/27/20 0450 01/28/20 0551 01/28/20 1402 01/28/20 2148 01/29/20 0500  WBC 14.0*  --  14.6*  --  17.6* 21.7*  --   --  24.1*  NEUTROABS 10.3*  --  11.1*  --  14.6* 17.5*  --   --  18.1*  HGB 8.1*   < > 8.2*   < > 8.5* 8.5* 8.6* 8.2* 8.5*  HCT 26.3*   < > 25.9*   < > 28.0* 27.8* 28.0* 27.6* 28.4*  MCV 93.6  --  92.5  --  94.6 95.5  --   --  95.9  PLT 215  --  205  --  206 224  --   --  241   < > = values in  this interval not displayed.    Basic Metabolic Panel: Recent Labs  Lab 01/25/20 0236 01/26/20 0303 01/27/20 0450 01/28/20 0551 01/29/20 0500  NA 129* 128* 131* 129* 132*  K 4.7 4.7 4.0 4.3 4.1  CL 93* 91* 95* 92* 93*  CO2 '24 22 24 23 26  ' GLUCOSE 112* 110* 182* 158* 133*  BUN 67* 94* 50* 85* 46*  CREATININE 3.87* 4.88* 3.28* 4.52* 3.04*  CALCIUM 8.3* 8.1* 7.8* 8.2* 8.4*  PHOS 6.1* 7.0* 4.3 5.4* 4.1     Liver Function Tests: Recent Labs  Lab 01/25/20 0236 01/26/20 0303 01/27/20 0450 01/28/20 0551 01/29/20 0500  ALBUMIN 1.2* 1.3* 1.3* 1.3* 1.5*     DVT prophylaxis: SCDs  Code Status: Full code  Family Communication: I have discussed with daughter at bedside    Status is: Inpatient  Dispo: The patient is from: Home              Anticipated d/c is to: LTAC              Anticipated d/c date is: 01/29/2020              Patient currently not medically stable for discharge  Barrier to discharge-requiring hemodialysis, intermittent mechanical ventilation     Scheduled medications:  . amantadine  100 mg Per Tube BID  . amiodarone  200 mg Per  Tube Daily  . atorvastatin  40 mg Per Tube Daily  . chlorhexidine gluconate (MEDLINE KIT)  15 mL Mouth Rinse BID  . Chlorhexidine Gluconate Cloth  6 each Topical Q0600  . darbepoetin (ARANESP) injection - DIALYSIS  100 mcg Intravenous Q Fri-HD  . docusate  100 mg Per Tube BID  . feeding supplement (PROSource TF)  45 mL Per Tube Daily  . Gerhardt's butt cream   Topical TID  . guaiFENesin  15 mL Per Tube Q4H  . insulin aspart  0-20 Units Subcutaneous Q4H  . levETIRAcetam  750 mg Per Tube Q24H  . mouth rinse  15 mL Mouth Rinse 10 times per day  . multivitamin  1 tablet Per Tube QHS  . pantoprazole sodium  40 mg Per Tube QHS  . phenytoin  100 mg Per Tube Q8H  . sodium chloride flush  10-40 mL Intracatheter Q12H    Consultants:  PCCM  Palliative  care  Nephrology  Neurology  Neurosurgery  IR  Procedures: 12/13/2019 thoracentesis: 1.5 L bloody fluid Bronchoscopy on 11/30/2019 ETT: 12/15/2019-01/06/2020 Tracheostomy: 01/06/2020 onwards Left pigtail chest tube 12/12/2019-12/25/2019 [facility] HD cath from 12/27/2019-01/13/2020 G-tube and tunneled vascular catheter placement on 01/13/2020  Overnight EEG on 12/27/2019: Right frontocentral seizures x2   Antibiotics:   Anti-infectives (From admission, onward)   Start     Dose/Rate Route Frequency Ordered Stop   01/15/20 0745  ceFAZolin (ANCEF) IVPB 2g/100 mL premix  Status:  Discontinued        2 g 200 mL/hr over 30 Minutes Intravenous  Once 01/15/20 0739 01/18/20 0938   01/13/20 1645  ceFAZolin (ANCEF) IVPB 2g/100 mL premix        2 g 200 mL/hr over 30 Minutes Intravenous To Radiology 01/13/20 1630 01/13/20 1640   01/13/20 1630  ceFAZolin (ANCEF) IVPB 2g/100 mL premix  Status:  Discontinued        2 g 200 mL/hr over 30 Minutes Intravenous To ShortStay Surgical 01/13/20 1625 01/13/20 1906   01/13/20 0800  ceFAZolin (ANCEF) IVPB 2g/100 mL premix        2 g 200 mL/hr over 30 Minutes Intravenous To Radiology 01/12/20 1007 01/13/20 1836   01/12/20 1030  ceFAZolin (ANCEF) IVPB 2g/100 mL premix  Status:  Discontinued        2 g 200 mL/hr over 30 Minutes Intravenous To Radiology 01/12/20 0927 01/12/20 1007   12/27/19 1000  ceFEPIme (MAXIPIME) 2 g in sodium chloride 0.9 % 100 mL IVPB  Status:  Discontinued        2 g 200 mL/hr over 30 Minutes Intravenous Every 12 hours 12/26/19 1412 12/27/19 1017   12/26/19 1000  ceFEPIme (MAXIPIME) 1 g in sodium chloride 0.9 % 100 mL IVPB  Status:  Discontinued        1 g 200 mL/hr over 30 Minutes Intravenous Every 24 hours 12/25/19 1152 12/26/19 1412   12/20/2019 1244  ceFAZolin (ANCEF) IVPB 2g/100 mL premix  Status:  Discontinued        2 g 200 mL/hr over 30 Minutes Intravenous 30 min pre-op 12/30/2019 1244 12/18/2019 1823   01/03/2020 0600  ceFEPIme (MAXIPIME)  1 g in sodium chloride 0.9 % 100 mL IVPB  Status:  Discontinued        1 g 200 mL/hr over 30 Minutes Intravenous Every 8 hours 01/04/2020 0337 12/25/2019 0340   12/27/2019 0400  vancomycin (VANCOCIN) IVPB 1000 mg/200 mL premix        1,000 mg 200 mL/hr over  60 Minutes Intravenous  Once 12/28/2019 0337 12/31/2019 0653   12/19/2019 0400  ceFEPIme (MAXIPIME) 2 g in sodium chloride 0.9 % 100 mL IVPB  Status:  Discontinued        2 g 200 mL/hr over 30 Minutes Intravenous Every 24 hours 12/17/2019 0340 12/25/19 1152   12/20/19 2200  amoxicillin-clavulanate (AUGMENTIN) 500-125 MG per tablet 500 mg  Status:  Discontinued        1 tablet Oral 2 times daily 12/20/19 1412 01/01/2020 0337   12/20/19 1415  amoxicillin-clavulanate (AUGMENTIN) 875-125 MG per tablet 1 tablet  Status:  Discontinued        1 tablet Oral Every 12 hours 12/20/19 1407 12/20/19 1411   12/13/2019 1245  piperacillin-tazobactam (ZOSYN) IVPB 3.375 g  Status:  Discontinued        3.375 g 12.5 mL/hr over 240 Minutes Intravenous Every 8 hours 12/10/2019 1235 12/20/19 1407       Objective   Vitals:   01/29/20 0900 01/29/20 1000 01/29/20 1100 01/29/20 1133  BP: (!) 158/50 (!) 150/60 (!) 160/67 (!) 160/67  Pulse: 82 75 69 76  Resp: (!) 32 (!) 29 (!) 36 (!) 33  Temp:      TempSrc:      SpO2: 96% 96% 96% 97%  Weight:      Height:        Intake/Output Summary (Last 24 hours) at 01/29/2020 1140 Last data filed at 01/29/2020 0800 Gross per 24 hour  Intake 1155 ml  Output 1 ml  Net 1154 ml    07/13 1901 - 07/15 0700 In: 2500  Out: 326   Filed Weights   01/28/20 0500 01/28/20 0700 01/28/20 1130  Weight: 55.3 kg 55.3 kg 53.3 kg    Physical Examination:  Patient is awake, had right side craniotomy, altered. Trach present, no discharge or bleed from site. Patient with good air entry bilaterally, no wheezing, but has scattered rhonchi Heart rate and rhythm, no rubs or gallops Abdomen soft, bowel sounds present Extremities with no edema,  clubbing or cyanosis, she is flaccid on the left side   Data Reviewed:   No results found for this or any previous visit (from the past 240 hour(s)).  No results for input(s): LIPASE, AMYLASE in the last 168 hours. No results for input(s): AMMONIA in the last 168 hours.  Cardiac Enzymes: No results for input(s): CKTOTAL, CKMB, CKMBINDEX, TROPONINI in the last 168 hours. BNP (last 3 results) Recent Labs    11/11/19 2226  BNP 93.9    Studies:  No results found.     Krystal Little   [facility] If 7PM-7AM, please contact night-coverage at [email or web address], Office  [phone]   01/29/2020, 11:40 AM  LOS: 46 days

## 2020-01-29 NOTE — Progress Notes (Signed)
   Patient Name: RAYMIE TRANI Date of Encounter: 01/29/2020, 12:29 PM    Subjective  No more hematochezia after rectal tube out   Objective  BP (!) 163/64   Pulse 76   Temp 99.1 F (37.3 C) (Oral)   Resp (!) 36   Ht 5\' 4"  (1.626 m)   Wt 53.3 kg   SpO2 97%   BMI 20.17 kg/m  Lab Results  Component Value Date   INR 1.4 (H) 01/29/2020   INR 1.4 (H) 01/06/2020   INR 1.1 12/22/2019   PTT NL  CBC Latest Ref Rng & Units 01/29/2020 01/28/2020 01/28/2020  WBC 4.0 - 10.5 K/uL 24.1(H) - -  Hemoglobin 12.0 - 15.0 g/dL 8.5(L) 8.2(L) 8.6(L)  Hematocrit 36 - 46 % 28.4(L) 27.6(L) 28.0(L)  Platelets 150 - 400 K/uL 241 - -        Assessment and Plan  Hematochezia - resolved - likely from rectal trauma/ulceration from balloon in flexiseal tube  Leave that out  Call us back prn  Signing off  Gatha Mayer, MD, [facility] 01/29/2020 12:29 PM

## 2020-01-29 NOTE — Progress Notes (Signed)
Physical Therapy Treatment Patient Details Name: Krystal Little MRN: [id number] DOB: 21-Aug-1955 Today's Date: 01/29/2020    History of Present Illness Pt is 64 y.o. female initially admitted for stroke. 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 Pt s/p R hemicraniectomy with bone flap placed in abdomen on 4/18.  PMH HTN, DM2, chronic a. Fib (coumadin) s/p pace maker.  She went to inpatient rehab and on May 29 readmitted to hospital in the setting of tachypnea and an enlarging left-sided pleural effusion. Thoracentesis was performed which showed a hemothorax.  A CT chest was then ordered showing obstruction of her left mainstem bronchus and a loculated left pleural effusion. She had bronchoscopy and L chest tube placed 5/30.  She was intubated 5/30-12/19/19. Pt reintubated 6/6 and underwent lt thoracotomy to drain hemothorax. Post op found to have craniotomy area fuller and CT showed  hemorrhage into the posterior R MCA distribution. Pt developed AKI and CRRT 6/11- 6/20. Intermittent HD initiated. Trach placed 6/22.     PT Comments    Pt able to tolerate OOB to recliner via lift today.    Follow Up Recommendations  LTACH     Equipment Recommendations  Other (comment) (To be determined at next venue)    Recommendations for Other Services       Precautions / Restrictions Precautions Precautions: Fall Precaution Comments: R hemicraniectomy- bone flap in R abdomen, needs helmet for OOB,  Restrictions Weight Bearing Restrictions: No    Mobility  Bed Mobility Overal bed mobility: Needs Assistance Bed Mobility: Rolling Rolling: Total assist         General bed mobility comments: Pt able to reach with RUE with cues for roll to lt. Assist for all other aspects  Transfers                 General transfer comment: Bed to recliner with [facility]  Ambulation/Gait                 Stairs             Wheelchair Mobility    Modified Rankin  (Stroke Patients Only) Modified Rankin (Stroke Patients Only) Pre-Morbid Rankin Score: No symptoms Modified Rankin: Severe disability     Balance Overall balance assessment: Needs assistance Sitting-balance support: Bilateral upper extremity supported;Feet unsupported Sitting balance-Leahy Scale: Zero Sitting balance - Comments: Max assist to sit edge of chair Postural control: Posterior lean;Right lateral lean                                  Cognition Arousal/Alertness: Awake/alert (with stimuli) Behavior During Therapy: Flat affect Overall Cognitive Status: Difficult to assess                         Following Commands: Follows one step commands consistently;Follows one step commands with increased time (with rt side)              Exercises General Exercises - Lower Extremity Long Arc Quad: AAROM;5 reps;Seated    General Comments General comments (skin integrity, edema, etc.): Pt on trach collar throughout. RR 30-42. BP stable      Pertinent Vitals/Pain Pain Assessment: Faces Faces Pain Scale: No hurt    Home Living                      Prior Function  PT Goals (current goals can now be found in the care plan section) Acute Rehab PT Goals Patient Stated Goal: Pt trached Progress towards PT goals: Progressing toward goals    Frequency    Min 2X/week      PT Plan Current plan remains appropriate    Co-evaluation              AM-PAC PT "6 Clicks" Mobility   Outcome Measure  Help needed turning from your back to your side while in a flat bed without using bedrails?: Total Help needed moving from lying on your back to sitting on the side of a flat bed without using bedrails?: Total Help needed moving to and from a bed to a chair (including a wheelchair)?: Total Help needed standing up from a chair using your arms (e.g., wheelchair or bedside chair)?: Total Help needed to walk in hospital room?:  Total Help needed climbing 3-5 steps with a railing? : Total 6 Click Score: 6    End of Session Equipment Utilized During Treatment: Oxygen;Other (comment) (trach collar) Activity Tolerance: Patient tolerated treatment well Patient left: in bed;with call bell/phone within reach   PT Visit Diagnosis: Other abnormalities of gait and mobility (R26.89);Hemiplegia and hemiparesis Hemiplegia - Right/Left: Left Hemiplegia - dominant/non-dominant: Non-dominant Hemiplegia - caused by: Cerebral infarction;Nontraumatic intracerebral hemorrhage     Time: 1020-1047 PT Time Calculation (min) (ACUTE ONLY): 27 min  Charges:  $Therapeutic Activity: 23-37 mins                     [place] Pager [phone] Office [place] 01/29/2020, 11:03 AM

## 2020-01-29 NOTE — Progress Notes (Signed)
Nutrition Follow-up  DOCUMENTATION CODES:   Not applicable  INTERVENTION:   Tube Feeding via G-tube: Nepro at 45 ml/hr Prosource TF 45 mL daily Tube feeding to provide 1984 kcal (100% of needs), 98 grams of protein, and 788 ml free water.  Meets 100% estimated calorie and protein needs  If emesis continues, may need to consider changing TF formula   NUTRITION DIAGNOSIS:   Inadequate oral intake related to acute illness as evidenced by NPO status.  Being addressed via TF   GOAL:   Patient will meet greater than or equal to 90% of their needs  Met  MONITOR:   Vent status, TF tolerance, Labs, Weight trends  REASON FOR ASSESSMENT:   Consult, Ventilator Enteral/tube feeding initiation and management  ASSESSMENT:   64 yo female admitted from [place] after being at [facility] in April in setting of R. MCA stroke requiring thrombectomy, developed significant brain edema requiring craniectomy. Pt developed L pleural effusion on Rehab requiring thoracentesis and ultimately required intubation due to resp failure from occlusion of L. Mainstem bronchus and large L hemothorax, bronch and chest tube placement. Pt did require Cortrak placement with TF initially but was taking po prior to readmission from Rehab. PMH DM, HTN, R. MCA stroke, SSS  5/29 Thoracentesis 5/30 Intubated, Chest tube 6/04Cortrak placed 6/06 Left VATS with drainage of left hemothorax 6/11 CRRT initiated 6/12 Seizures+ on EEG 6/20 CRRT stopped 6/21 First iHD 6/22 Trach placement 6/29 20 Fr. PEG tube placed , RIJ tunneled HD cath placed  Pt has been off the vent support x 48 hours. Noted pt placement is pending   Pt has been experiencing emesis. Noted episode of emesis overnight  Nepro at 45 ml/hr, ProSource TF 45 mL daily via G-tube  Noted GI consulted for possible GI bleed; bleeding has resolved. Per MD notes, hematochezia likely from rectal trauma/ulceration from rectal tube which has been  removed  Labs: sodium 132 (L), potassium 4.1 (wdl), phosphorus 4.1 (wdl), corrected calcium 10.4  Meds: Rena-Vite, zofran, imodium   Diet Order:   Diet Order            Diet NPO time specified  Diet effective now                 EDUCATION NEEDS:   Not appropriate for education at this time  Skin:  Skin Assessment: Reviewed RN Assessment Skin Integrity Issues:: Incisions, Other (Comment) Incisions: incision to groin, head, abdomen Other: MASD to coccyx, sacrum; anal fissure (rectal tube removed)  Last BM:  7/14  Height:   Ht Readings from Last 1 Encounters:  12/06/2019 _0  (1.626 m)    Weight:   Wt Readings from Last 1 Encounters:  01/28/20 53.3 kg    BMI:  Body mass index is 20.17 kg/m.  Estimated Nutritional Needs:   Kcal:  8638-1771 kcals  Protein:  90-120 g  Fluid:  1000 mL plus UOP    Kerman Passey MS, RDN, LDN, CNSC Registered Dietitian III Clinical Nutrition RD Pager and On-Call Pager Number Located in [place]

## 2020-01-29 NOTE — Progress Notes (Addendum)
Patient ID: Krystal Little, female   DOB: [birth year], 64 y.o.   MRN: [id number] S:  Awake and interactive with OT this morning.  Tolerated HD well yest.  O:BP (!) 150/60   Pulse 75   Temp 99.1 F (37.3 C) (Oral)   Resp (!) 29   Ht _0  (1.626 m)   Wt 53.3 kg   SpO2 96%   BMI 20.17 kg/m   Intake/Output Summary (Last 24 hours) at 01/29/2020 1102 Last data filed at 01/29/2020 0800 Gross per 24 hour  Intake 1155 ml  Output 1 ml  Net 1154 ml   Intake/Output: I/O last 3 completed shifts: In: 2500 [Other:430; NG/GT:2070] Out: 326 [Stool:326]  Intake/Output this shift:  Total I/O In: 135 [NG/GT:135] Out: -  Weight change: -2 kg Gen: NAD, answering simple questions with head shakes CVS: RRR, no rub Resp: on TC, normal WOB Abd: benign Ext: trace edema.  Recent Labs  Lab 01/23/20 0507 01/24/20 0313 01/25/20 0236 01/26/20 0303 01/27/20 0450 01/28/20 0551 01/29/20 0500  NA 132* 134* 129* 128* 131* 129* 132*  K 4.3 4.1 4.7 4.7 4.0 4.3 4.1  CL 95* 96* 93* 91* 95* 92* 93*  CO2 _1 GLUCOSE 98 111* 112* 110* 182* 158* 133*  BUN 75* 41* 67* 94* 50* 85* 46*  CREATININE 4.13* 2.63* 3.87* 4.88* 3.28* 4.52* 3.04*  ALBUMIN 1.2* 1.3* 1.2* 1.3* 1.3* 1.3* 1.5*  CALCIUM 8.3* 8.1* 8.3* 8.1* 7.8* 8.2* 8.4*  PHOS 5.0* 4.1 6.1* 7.0* 4.3 5.4* 4.1   Liver Function Tests: Recent Labs  Lab 01/27/20 0450 01/28/20 0551 01/29/20 0500  ALBUMIN 1.3* 1.3* 1.5*   No results for input(s): LIPASE, AMYLASE in the last 168 hours. No results for input(s): AMMONIA in the last 168 hours. CBC: Recent Labs  Lab 01/25/20 0238 01/25/20 0238 01/26/20 0303 01/26/20 0303 01/27/20 0450 01/27/20 0450 01/28/20 0551 01/28/20 0551 01/28/20 1402 01/28/20 2148 01/29/20 0500  WBC 14.0*   < > 14.6*   < > 17.6*  --  21.7*  --   --   --  24.1*  NEUTROABS 10.3*   < > 11.1*   < > 14.6*  --  17.5*  --   --   --  18.1*  HGB 8.1*   < > 8.2*   < > 8.5*   < > 8.5*   < > 8.6* 8.2* 8.5*  HCT  26.3*   < > 25.9*   < > 28.0*   < > 27.8*   < > 28.0* 27.6* 28.4*  MCV 93.6  --  92.5  --  94.6  --  95.5  --   --   --  95.9  PLT 215   < > 205   < > 206  --  224  --   --   --  241   < > = values in this interval not displayed.   Cardiac Enzymes: No results for input(s): CKTOTAL, CKMB, CKMBINDEX, TROPONINI in the last 168 hours. CBG: Recent Labs  Lab 01/28/20 1552 01/28/20 2055 01/29/20 0039 01/29/20 0526 01/29/20 0831  GLUCAP 187* 173* 169* 126* 219*    Iron Studies: No results for input(s): IRON, TIBC, TRANSFERRIN, FERRITIN in the last 72 hours. Studies/Results: No results found. Marland Kitchen amantadine  100 mg Per Tube BID  . amiodarone  200 mg Per Tube Daily  . atorvastatin  40 mg Per Tube Daily  . chlorhexidine gluconate (MEDLINE KIT)  15  mL Mouth Rinse BID  . Chlorhexidine Gluconate Cloth  6 each Topical Q0600  . darbepoetin (ARANESP) injection - DIALYSIS  100 mcg Intravenous Q Fri-HD  . docusate  100 mg Per Tube BID  . feeding supplement (PROSource TF)  45 mL Per Tube Daily  . Gerhardt's butt cream   Topical TID  . guaiFENesin  15 mL Per Tube Q4H  . insulin aspart  0-20 Units Subcutaneous Q4H  . levETIRAcetam  750 mg Per Tube Q24H  . mouth rinse  15 mL Mouth Rinse 10 times per day  . multivitamin  1 tablet Per Tube QHS  . pantoprazole sodium  40 mg Per Tube QHS  . phenytoin  100 mg Per Tube Q8H  . sodium chloride flush  10-40 mL Intracatheter Q12H    BMET    Component Value Date/Time   NA 132 (L) 01/29/2020 0500   NA 140 08/18/2019 1120   K 4.1 01/29/2020 0500   CL 93 (L) 01/29/2020 0500   CO2 26 01/29/2020 0500   GLUCOSE 133 (H) 01/29/2020 0500   BUN 46 (H) 01/29/2020 0500   BUN 12 08/18/2019 1120   CREATININE 3.04 (H) 01/29/2020 0500   CREATININE 0.73 05/18/2016 0919   CALCIUM 8.4 (L) 01/29/2020 0500   GFRNONAA 15 (L) 01/29/2020 0500   GFRNONAA 89 05/18/2016 0919   GFRAA 18 (L) 01/29/2020 0500   GFRAA >89 05/18/2016 0919   CBC    Component Value Date/Time    WBC 24.1 (H) 01/29/2020 0500   RBC 2.96 (L) 01/29/2020 0500   HGB 8.5 (L) 01/29/2020 0500   HGB 15.2 08/18/2019 1120   HCT 28.4 (L) 01/29/2020 0500   HCT 45.9 08/18/2019 1120   PLT 241 01/29/2020 0500   PLT 168 08/18/2019 1120   MCV 95.9 01/29/2020 0500   MCV 94 08/18/2019 1120   MCH 28.7 01/29/2020 0500   MCHC 29.9 (L) 01/29/2020 0500   RDW 19.5 (H) 01/29/2020 0500   RDW 12.2 08/18/2019 1120   LYMPHSABS 2.8 01/29/2020 0500   MONOABS 2.2 (H) 01/29/2020 0500   EOSABS 0.5 01/29/2020 0500   BASOSABS 0.1 01/29/2020 0500    Assessment/Plan:  1. AKI- due to ischemic ATN in setting of hemorrhagic shock requiring pressor support and initiated on CRRT 6/11-6/20/21 and transitioned to IHD on 6/21 and has remained oliguric and dialysis dependent. 1. Will continue with HD on MWF schedule for now. 2. Has been tolerating HD  3. No signs of renal recovery at this time. 4. Next HD Friday, plan 2-3L as tolerated 2. Left sided hemothorax- s/p VATS on 6/6 and s/p removal of chest tubes 3. 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 4. VDRF- s/p trach 6/22/2, tol TC now. 5. Chronic atrial fibrillation- on amio and asa 6. HTN- stable; BPs quite variable 7. Seizure disorder- per neuro 8. Nutrition- s/p PEG by IR on 6/29 9. ABLA/Anemia of critical illness- s/p multiple transfusions, On ESA qFriday.  10. Hyponatremia - noted 7/12; improved with HD and stopping FWF via tube.  Na 132 today. 11. Disposition- will need LTAC but poor overall prognosis. Continue with ongoing discussions with family regarding poor prognosis for meaningful recovery.  Jannifer Hick MD [facility] Pager [phone]

## 2020-01-29 NOTE — Progress Notes (Signed)
Occupational Therapy Treatment Note  Pt seen OOB in recliner with focus of session on facilitating midline postural awareness and trunk control. Noted pt with active proximal movement LUE spontaneously during activities requiring BUE integration, however movement not functional. Increased ability to sustain visual attention to L visual field.Educated husband on L neglect and benefits of sitting @ pt's midline and encouraging her to do some simple self care, rubbing lotion on her affected UE to increase awareness of L body/environment.   VSS stable on TC. Pt fatigued at end of session. Continue to recommend rehab at [facility].    01/29/20 1300  OT Visit Information  Last OT Received On 01/29/20  Assistance Needed +2  History of Present Illness Pt is 64 y.o. female initially admitted for stroke. 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 Pt s/p R hemicraniectomy with bone flap placed in abdomen on 4/18.  PMH HTN, DM2, chronic a. Fib (coumadin) s/p pace maker.  She went to inpatient rehab and on May 29 readmitted to hospital in the setting of tachypnea and an enlarging left-sided pleural effusion. Thoracentesis was performed which showed a hemothorax.  A CT chest was then ordered showing obstruction of her left mainstem bronchus and a loculated left pleural effusion. She had bronchoscopy and L chest tube placed 5/30.  She was intubated 5/30-12/19/19. Pt reintubated 6/6 and underwent lt thoracotomy to drain hemothorax. Post op found to have craniotomy area fuller and CT showed  hemorrhage into the posterior R MCA distribution. Pt developed AKI and CRRT 6/11- 6/20. Intermittent HD initiated. Trach placed 6/22.   Precautions  Precautions Fall  Precaution Comments R hemicraniectomy- bone flap in R abdomen, needs helmet for OOB,   Pain Assessment  Pain Assessment Faces  Faces Pain Scale 2  Pain Location  (general grimacing with trunk rotation; forward flexion)  Pain Descriptors /  Indicators Grimacing  Pain Intervention(s) Limited activity within patient's tolerance  Cognition  Arousal/Alertness Awake/alert  Behavior During Therapy Flat affect  Overall Cognitive Status Impaired/Different from baseline  Area of Impairment Attention;Following commands;Safety/judgement;Awareness;Problem solving  Current Attention Level Sustained  Following Commands Follows one step commands with increased time  Safety/Judgement Decreased awareness of safety;Decreased awareness of deficits  Awareness Intellectual  Problem Solving Slow processing;Decreased initiation;Difficulty sequencing;Requires verbal cues;Requires tactile cues  Difficult to assess due to Tracheostomy  Upper Extremity Assessment  Upper Extremity Assessment RUE deficits/detail;LUE deficits/detail  ADL  Eating/Feeding NPO  Grooming Wash/dry face;Oral care;Maximal assistance  Upper Body Bathing Total assistance  Functional mobility during ADLs Total assistance  General ADL Comments Overall total A with exception of grooming  Bed Mobility  General bed mobility comments OOB in chair  Balance  Sitting balance-Leahy Scale Zero  Vision- Assessment  Additional Comments R gaze preference but will look L toward people in hall  General Comments  General comments (skin integrity, edema, etc.) on TC; VSS  General Exercises - Upper Extremity  Shoulder Flexion AAROM;Right;PROM;Left;10 reps  Elbow Flexion Right;10 reps;AROM;Seated (PROM L)  Elbow Extension AROM;Right;10 reps;Seated (PROM L)  Digit Composite Flexion Left;10 reps;Seated  Composite Extension Left;10 reps;Seated  Shoulder ABduction AAROM;Right;10 reps (PROM L)  Other Exercises  Other Exercises Facilitated trunk control adn midline postural control in sitting from edge of chair. Total A to shift weight anteriorly however did not appear to push posteriorly; R bias; Attempted to have pt push through RUE to right self to midline; Pt with eventual carry over -  feel affected by fatigue  Other  Exercises facilitated trunk rotation  adn incresing trunk extension by turnig to look toward L at staff in hall  Other Exercises Pt leaning forward to rub lotion on R leg  Other Exercises L scapula elevation/depression; upward/downward rotation  OT - End of Session  Equipment Utilized During Treatment Oxygen (TC)  Activity Tolerance Patient limited by fatigue  Patient left in chair;with call bell/phone within reach;with chair alarm set  Nurse Communication Mobility status  OT Assessment/Plan  OT Plan Discharge plan remains appropriate  OT Visit Diagnosis Muscle weakness (generalized) (M62.81);Other abnormalities of gait and mobility (R26.89);Other symptoms and signs involving cognitive function;Hemiplegia and hemiparesis;Low vision, both eyes (H54.2)  Hemiplegia - Right/Left Left  Hemiplegia - dominant/non-dominant Non-Dominant  Hemiplegia - caused by Cerebral infarction  Pain - Right/Left Right  Pain - part of body  (generalized)  OT Frequency (ACUTE ONLY) Min 2X/week  Follow Up Recommendations LTACH  OT Equipment Other (comment) (TBA)  AM-PAC OT "6 Clicks" Daily Activity Outcome Measure (Version 2)  Help from another person eating meals? 1  Help from another person taking care of personal grooming? 2  Help from another person toileting, which includes using toliet, bedpan, or urinal? 1  Help from another person bathing (including washing, rinsing, drying)? 1  Help from another person to put on and taking off regular upper body clothing? 1  Help from another person to put on and taking off regular lower body clothing? 1  6 Click Score 7  OT Goal Progression  Progress towards OT goals Progressing toward goals (goals remain appropriate)  Acute Rehab OT Goals  Patient Stated Goal Pt unable to state  OT Goal Formulation With family  Time For Goal Achievement 02/12/20  Potential to Achieve Goals Fair  ADL Goals  Pt Will Perform Grooming with mod  assist;sitting  Pt Will Transfer to Toilet with mod assist;stand pivot transfer;bedside commode  Pt Will Perform Toileting - Clothing Manipulation and hygiene with mod assist;sit to/from stand;sitting/lateral leans  Pt/caregiver will Perform Home Exercise Program Increased strength;Increased ROM;Both right and left upper extremity;With minimal assist;With written HEP provided  Additional ADL Goal #1 Pt will perform bed mobility with modA+2 as precursor to EOB/OOB ADL.  Additional ADL Goal #2 Pt will follow 2-3 step commands with 75% accuracy during ADL/functional task.  OT Time Calculation  OT Start Time (ACUTE ONLY) 1236  OT Stop Time (ACUTE ONLY) 1309  OT Time Calculation (min) 33 min  OT General Charges  $OT Visit 1 Visit  OT Treatments  $Self Care/Home Management  8-22 mins  $Neuromuscular Re-education 8-22 mins  Maurie Boettcher, OT/L   Acute OT Clinical Specialist [place] Pager [phone] Office [phone]

## 2020-01-30 ENCOUNTER — Inpatient Hospital Stay (HOSPITAL_COMMUNITY): Payer: Medicare Other

## 2020-01-30 LAB — CBC WITH DIFFERENTIAL/PLATELET
Abs Immature Granulocytes: 0.21 10*3/uL — ABNORMAL HIGH (ref 0.00–0.07)
Basophils Absolute: 0.2 10*3/uL — ABNORMAL HIGH (ref 0.0–0.1)
Basophils Relative: 1 %
Eosinophils Absolute: 0.6 10*3/uL — ABNORMAL HIGH (ref 0.0–0.5)
Eosinophils Relative: 3 %
HCT: 27.1 % — ABNORMAL LOW (ref 36.0–46.0)
Hemoglobin: 8.1 g/dL — ABNORMAL LOW (ref 12.0–15.0)
Immature Granulocytes: 1 %
Lymphocytes Relative: 12 %
Lymphs Abs: 2.8 10*3/uL (ref 0.7–4.0)
MCH: 28.3 pg (ref 26.0–34.0)
MCHC: 29.9 g/dL — ABNORMAL LOW (ref 30.0–36.0)
MCV: 94.8 fL (ref 80.0–100.0)
Monocytes Absolute: 2.1 10*3/uL — ABNORMAL HIGH (ref 0.1–1.0)
Monocytes Relative: 9 %
Neutro Abs: 17.1 10*3/uL — ABNORMAL HIGH (ref 1.7–7.7)
Neutrophils Relative %: 74 %
Platelets: 248 10*3/uL (ref 150–400)
RBC: 2.86 MIL/uL — ABNORMAL LOW (ref 3.87–5.11)
RDW: 19.3 % — ABNORMAL HIGH (ref 11.5–15.5)
WBC: 23 10*3/uL — ABNORMAL HIGH (ref 4.0–10.5)
nRBC: 0.1 % (ref 0.0–0.2)

## 2020-01-30 LAB — RENAL FUNCTION PANEL
Albumin: 1.5 g/dL — ABNORMAL LOW (ref 3.5–5.0)
Anion gap: 16 — ABNORMAL HIGH (ref 5–15)
BUN: 78 mg/dL — ABNORMAL HIGH (ref 8–23)
CO2: 24 mmol/L (ref 22–32)
Calcium: 8.7 mg/dL — ABNORMAL LOW (ref 8.9–10.3)
Chloride: 91 mmol/L — ABNORMAL LOW (ref 98–111)
Creatinine, Ser: 4.61 mg/dL — ABNORMAL HIGH (ref 0.44–1.00)
GFR calc Af Amer: 11 mL/min — ABNORMAL LOW (ref >60)
GFR calc non Af Amer: 9 mL/min — ABNORMAL LOW (ref >60)
Glucose, Bld: 101 mg/dL — ABNORMAL HIGH (ref 70–99)
Phosphorus: 7.5 mg/dL — ABNORMAL HIGH (ref 2.5–4.6)
Potassium: 4.6 mmol/L (ref 3.5–5.1)
Sodium: 131 mmol/L — ABNORMAL LOW (ref 135–145)

## 2020-01-30 LAB — GLUCOSE, CAPILLARY
Glucose-Capillary: 144 mg/dL — ABNORMAL HIGH (ref 70–99)
Glucose-Capillary: 66 mg/dL — ABNORMAL LOW (ref 70–99)
Glucose-Capillary: 143 mg/dL — ABNORMAL HIGH (ref 70–99)
Glucose-Capillary: 123 mg/dL — ABNORMAL HIGH (ref 70–99)
Glucose-Capillary: 101 mg/dL — ABNORMAL HIGH (ref 70–99)
Glucose-Capillary: 196 mg/dL — ABNORMAL HIGH (ref 70–99)

## 2020-01-30 MED ORDER — METOCLOPRAMIDE HCL 5 MG/ML IJ SOLN
5.0000 mg | Freq: Four times a day (QID) | INTRAMUSCULAR | Status: AC
  Administered 2020-01-30 – 2020-01-31 (×6): 5 mg via INTRAVENOUS
  Filled 2020-01-30 (×6): qty 2

## 2020-01-30 MED ORDER — DARBEPOETIN ALFA 100 MCG/0.5ML IJ SOSY
100.0000 ug | PREFILLED_SYRINGE | Freq: Once | INTRAMUSCULAR | Status: AC
  Administered 2020-01-30: 100 ug via SUBCUTANEOUS
  Filled 2020-01-30: qty 0.5

## 2020-01-30 MED ORDER — NEPRO/CARBSTEADY PO LIQD: 20.0000 mL | ORAL | Status: DC

## 2020-01-30 MED ORDER — PROSOURCE TF PO LIQD
45.0000 mL | Freq: Two times a day (BID) | ORAL | Status: DC
  Administered 2020-01-30 – 2020-01-31 (×3): 45 mL
  Filled 2020-01-30 (×6): qty 45

## 2020-01-30 MED ORDER — OSMOLITE 1.5 CAL PO LIQD
1000.0000 mL | ORAL | Status: DC
  Administered 2020-01-30: 1000 mL
  Filled 2020-01-30: qty 1000

## 2020-01-30 MED ORDER — ASPIRIN 325 MG PO TABS
325.0000 mg | ORAL_TABLET | Freq: Every day | ORAL | Status: DC
  Administered 2020-01-31 – 2020-02-01 (×2): 325 mg
  Filled 2020-01-30 (×2): qty 1

## 2020-01-30 MED ORDER — DEXTROSE 50 % IV SOLN
INTRAVENOUS | Status: AC
  Administered 2020-01-30: 0.5
  Filled 2020-01-30: qty 50

## 2020-01-30 NOTE — Progress Notes (Signed)
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  On 12/13/2019, PCCM was consulted for tachypnea and enlarging left-sided pleural effusion, thoracentesis showed hemothorax.  CT of the chest showed obstruction of her left mainstem bronchus and loculated left pleural effusion.  She was admitted  to ICU under PCCM service on 11/23/2019 for chest tube placement and bronchoscopy.  She underwent intrapleural TPA but required VATS eventually.  Her course was complicated by ICH, seizures, AKI, requiring CRRT.  Patient subsequently had tracheostomy placed on 12/22/2019, NG tube and tunneled vascular catheter placed on 01/13/2020.  Subsequently patient was started on intermittent hemodialysis.  Significant Hospital events 4/16 IR thrombectomy R MCA stroke 5/6 transfer to [facility] Inpatient [facility] 5/29 PCCM consult left pleural effusion, thoracentesis 5/30 worsening tachypnea. Moved to ICU for intubation to facilitate bronchoscopy and chest tube placement 5/31 TPA/ pulmonzyme started for 3 days for persistent left effusion 6/01 Afebrile, low-dose Levophed, Precedex, Minimal output on chest tube- pulmozyme / tpa x 2,  6/02 pulmozyme and TPA completed 3 days 6/03 afib with RVR placed on amio gtt overnight , 1U PRBC for Hb 7.1  6/05 extubated successfully, Repeat TPA/Pulmonzyme given again  6/06 acute decompensation overnight, hemorrhagic shock >to OR for left VATS with 2L hemothorax drained. CT head with new ICH>neurosurgery  consulted 6/09 More awake but agitated on PSV weaning  6/12: Seizures + on EEG 6/22 trach 6/29 g tube and tunneled vascular catheter placed 7/2: Care transferred to [facility]  Subjective   Patient seen and examined, patient had some vomiting yesterday after she had significant cough and gagging, her tube feed were held, no recurrence of vomiting, having good bowel movements, no blood in stools.   Assessment/Plan:    Acute respiratory failure with hypoxia s/p tracheostomy -mucous plugging, suspected aspiration, left-sided loculated effusion complicated with hemothorax status post VATS decortication.   -Patient has a long hospitalization and was intubated but subsequently underwent tracheostomy on 01/06/2020.  Patient is requiring mechanical ventilation intermittently.  Wean off as able. Chest x-ray done 7/12  shows left-sided pleural effusion with mid and left lung base opacity. Also shows improved aeration in right lung fields. Will continue to monitor.  He has been off the vent for last 72 hours. -Patient appears to be with worsening leukocytosis, so chest x-ray was obtained, does show stable left lung opacity, so far I see no indication for antibiotics but will continue to monitor closely, I will obtain blood cultures as well. -She remains on ATC at 40%.   Acute kidney injury-oliguric ischemic ATN secondary to hemorrhagic shock. -  Refractory to IV diuretics, started on CRRT from 12/26/2019 6 08/23/2019.  Subsequently started on hemodialysis from 01/05/2020.  Patient is status post right IJ tunneled dialysis catheter placement by IR on 01/13/2020.  Nephrology following and managing hemodialysis. - Patient is a poor candidate for long-term hemodialysis in this current state.  History of right MCA stroke-s/p thrombectomy  -with residual left-sided paralysis s/p right hemicraniectomy complicated by ICH after intrapleural TPA. -Globin remained stable with no recurrence of blood in stool, will resume  aspirin tomorrow.  Dysphagia-patient without new ICH on 12/23/2019.  Manage medically as per neurosurgery recommendations.  G-tube was placed on 01/13/2020 and feeding was started.   -Patient had vomiting yesterday, held 2 feet yesterday, abdominal x-ray showing improving ileus, she will be resumed back on her tube feed and uptitrate rate slowly . -I will start on scheduled Reglan x6 doses to see if this helps with her ileus .  Hematochezia -Patient with Flexi-Seal tube, possible rectal ulcer, this has been discontinued, her aspirin has been stopped, GI input greatly appreciated . -H&H has been stable, continue to monitor closely especially will resume on aspirin tomorrow. -Avoid Flexi-Seal in the future.  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  EEG had shown status epilepticus.  Continue phenytoin via G-tube.  Continue Keppra.  As needed Ativan.  Chronic atrial fibrillation-patient has history of sick sinus syndrome s/p pacemaker placement.  Continue amiodarone, aspirin.  Protein calorie malnutrition-tube feed restarted at 45 cc/h.  Tolerating tube feeds well.  Diabetes mellitus type 2 with hyperglycemia and hypoglycemia episodes-continue sliding scale insulin with NovoLog.  Hyponatremia-patient has mild hyponatremia, will hold free water.  Nephrology following.  Anemia chronic disease-secondary to chronic illness hemoglobin has been stable.  Patient  received multiple PRBCs transfusions.  Will transfuse for hemoglobin 7.0.  Hypertension-blood pressure is controlled.  Overall poor prognosis.  Patient is very deconditioned and has poor prognosis.  Palliative care was consulted and has signed off.  Patient will need LTAC placement.    SpO2: 100 % O2 Flow Rate (L/min): 10 L/min FiO2 (%): 40 %     Lab Results  Component Value Date   SARSCOV2NAA NEGATIVE 10/31/2019     CBG: Recent Labs  Lab 01/29/20 1931 01/29/20 2353 01/30/20 0342 01/30/20 0801  01/30/20 0832  GLUCAP 149* 191* 144* 66* 143*    CBC: Recent Labs  Lab 01/26/20 0303 01/26/20 0303 01/27/20 0450 01/27/20 0450 01/28/20 0551 01/28/20 1402 01/28/20 2148 01/29/20 0500 01/30/20 0506  WBC 14.6*  --  17.6*  --  21.7*  --   --  24.1* 23.0*  NEUTROABS 11.1*  --  14.6*  --  17.5*  --   --  18.1* 17.1*  HGB 8.2*   < > 8.5*   < > 8.5* 8.6* 8.2* 8.5* 8.1*  HCT 25.9*   < > 28.0*   < > 27.8* 28.0* 27.6* 28.4* 27.1*  MCV 92.5  --  94.6  --  95.5  --   --  95.9 94.8  PLT 205  --  206  --  224  --   --  241 248   < > = values in this interval not displayed.    Basic Metabolic Panel: Recent Labs  Lab 01/26/20 0303 01/27/20 0450 01/28/20 0551 01/29/20 0500 01/30/20 0506  NA 128* 131* 129* 132* 131*  K 4.7 4.0 4.3 4.1 4.6  CL 91* 95* 92* 93* 91*  CO2 _0 GLUCOSE 110* 182* 158* 133* 101*  BUN 94* 50* 85* 46* 78*  CREATININE 4.88* 3.28* 4.52* 3.04* 4.61*  CALCIUM 8.1* 7.8* 8.2* 8.4* 8.7*  PHOS 7.0* 4.3 5.4* 4.1 7.5*     Liver Function Tests: Recent Labs  Lab 01/26/20 0303 01/27/20 0450 01/28/20 0551 01/29/20 0500 01/30/20 0506  ALBUMIN 1.3* 1.3* 1.3* 1.5* 1.5*     DVT prophylaxis: SCDs  Code Status: Full code  Family Communication: none at [place]    Status is: Inpatient  Dispo: The patient is from: Home  Anticipated d/c is to: LTAC              Anticipated d/c date is: 01/29/2020              Patient currently not medically stable for discharge  Barrier to discharge-requiring hemodialysis, intermittent mechanical ventilation     Scheduled medications:  . amantadine  100 mg Per Tube BID  . amiodarone  200 mg Per Tube Daily  . [START ON 01/31/2020] aspirin  325 mg Per Tube Daily  . atorvastatin  40 mg Per Tube Daily  . chlorhexidine gluconate (MEDLINE KIT)  15 mL Mouth Rinse BID  . Chlorhexidine Gluconate Cloth  6 each Topical Q0600  . chlorpheniramine-HYDROcodone  10 mg of hydrocodone Oral Q12H  . darbepoetin  (ARANESP) injection - DIALYSIS  100 mcg Intravenous Q Fri-HD  . feeding supplement (PROSource TF)  45 mL Per Tube Daily  . Gerhardt's butt cream   Topical TID  . guaiFENesin  15 mL Per Tube Q4H  . insulin aspart  0-20 Units Subcutaneous Q4H  . levETIRAcetam  750 mg Per Tube Q24H  . mouth rinse  15 mL Mouth Rinse 10 times per day  . metoCLOPramide (REGLAN) injection  5 mg Intravenous Q6H  . multivitamin  1 tablet Per Tube QHS  . pantoprazole sodium  40 mg Per Tube QHS  . phenytoin  100 mg Per Tube Q8H  . sodium chloride flush  10-40 mL Intracatheter Q12H    Consultants:  PCCM  Palliative care  Nephrology  Neurology  Neurosurgery  IR  Procedures: 12/13/2019 thoracentesis: 1.5 L bloody fluid Bronchoscopy on 12/12/2019 ETT: 12/02/2019-01/06/2020 Tracheostomy: 01/06/2020 onwards Left pigtail chest tube 11/25/2019-01/09/2020 [facility] HD cath from 12/27/2019-01/13/2020 G-tube and tunneled vascular catheter placement on 01/13/2020  Overnight EEG on 12/27/2019: Right frontocentral seizures x2   Antibiotics:   Anti-infectives (From admission, onward)   Start     Dose/Rate Route Frequency Ordered Stop   01/15/20 0745  ceFAZolin (ANCEF) IVPB 2g/100 mL premix  Status:  Discontinued        2 g 200 mL/hr over 30 Minutes Intravenous  Once 01/15/20 0739 01/18/20 0938   01/13/20 1645  ceFAZolin (ANCEF) IVPB 2g/100 mL premix        2 g 200 mL/hr over 30 Minutes Intravenous To Radiology 01/13/20 1630 01/13/20 1640   01/13/20 1630  ceFAZolin (ANCEF) IVPB 2g/100 mL premix  Status:  Discontinued        2 g 200 mL/hr over 30 Minutes Intravenous To ShortStay Surgical 01/13/20 1625 01/13/20 1906   01/13/20 0800  ceFAZolin (ANCEF) IVPB 2g/100 mL premix        2 g 200 mL/hr over 30 Minutes Intravenous To Radiology 01/12/20 1007 01/13/20 1836   01/12/20 1030  ceFAZolin (ANCEF) IVPB 2g/100 mL premix  Status:  Discontinued        2 g 200 mL/hr over 30 Minutes Intravenous To Radiology 01/12/20 0927  01/12/20 1007   12/27/19 1000  ceFEPIme (MAXIPIME) 2 g in sodium chloride 0.9 % 100 mL IVPB  Status:  Discontinued        2 g 200 mL/hr over 30 Minutes Intravenous Every 12 hours 12/26/19 1412 12/27/19 1017   12/26/19 1000  ceFEPIme (MAXIPIME) 1 g in sodium chloride 0.9 % 100 mL IVPB  Status:  Discontinued        1 g 200 mL/hr over 30 Minutes Intravenous Every 24 hours 12/25/19 1152 12/26/19 1412   12/17/2019 1244  ceFAZolin (ANCEF) IVPB  2g/100 mL premix  Status:  Discontinued        2 g 200 mL/hr over 30 Minutes Intravenous 30 min pre-op 01/03/2020 1244 01/09/2020 1823   01/09/2020 0600  ceFEPIme (MAXIPIME) 1 g in sodium chloride 0.9 % 100 mL IVPB  Status:  Discontinued        1 g 200 mL/hr over 30 Minutes Intravenous Every 8 hours 12/23/2019 0337 12/20/2019 0340   12/20/2019 0400  vancomycin (VANCOCIN) IVPB 1000 mg/200 mL premix        1,000 mg 200 mL/hr over 60 Minutes Intravenous  Once 12/25/2019 0337 12/17/2019 0653   01/14/2020 0400  ceFEPIme (MAXIPIME) 2 g in sodium chloride 0.9 % 100 mL IVPB  Status:  Discontinued        2 g 200 mL/hr over 30 Minutes Intravenous Every 24 hours 01/06/2020 0340 12/25/19 1152   12/20/19 2200  amoxicillin-clavulanate (AUGMENTIN) 500-125 MG per tablet 500 mg  Status:  Discontinued        1 tablet Oral 2 times daily 12/20/19 1412 01/14/2020 0337   12/20/19 1415  amoxicillin-clavulanate (AUGMENTIN) 875-125 MG per tablet 1 tablet  Status:  Discontinued        1 tablet Oral Every 12 hours 12/20/19 1407 12/20/19 1411   12/04/2019 1245  piperacillin-tazobactam (ZOSYN) IVPB 3.375 g  Status:  Discontinued        3.375 g 12.5 mL/hr over 240 Minutes Intravenous Every 8 hours 12/06/2019 1235 12/20/19 1407       Objective   Vitals:   01/30/20 0759 01/30/20 0800 01/30/20 0900 01/30/20 1130  BP:  (!) 110/51 (!) 113/57 (!) 106/42  Pulse:  67 65 68  Resp:  17 (!) 29 18  Temp: 99.1 F (37.3 C)     TempSrc: Oral     SpO2:  98% 99% 100%  Weight:      Height:        Intake/Output  Summary (Last 24 hours) at 01/30/2020 1141 Last data filed at 01/29/2020 1800 Gross per 24 hour  Intake 360 ml  Output 1 ml  Net 359 ml    07/14 1901 - 07/16 0700 In: 1035  Out: 1   Filed Weights   01/28/20 0700 01/28/20 1130 01/30/20 0500  Weight: 55.3 kg 53.3 kg 57.3 kg    Physical Examination:  She did awake, right side craniotomy, in no apparent distress . Trach present,  Symmetrical chest movement bilaterally, some rhonchi in the left lung . Regular rate and rhythm, no rubs murmurs or gallops Abdomen soft, bowel sounds present Extremities with no edema, clubbing or cyanosis, she is flaccid on the left side   Data Reviewed:   No results found for this or any previous visit (from the past 240 hour(s)).  No results for input(s): LIPASE, AMYLASE in the last 168 hours. No results for input(s): AMMONIA in the last 168 hours.  Cardiac Enzymes: No results for input(s): CKTOTAL, CKMB, CKMBINDEX, TROPONINI in the last 168 hours. BNP (last 3 results) Recent Labs    11/11/19 2226  BNP 93.9    Studies:  DG Chest Port 1 View  Result Date: 01/30/2020 CLINICAL DATA:  Leukocytosis EXAM: PORTABLE CHEST 1 VIEW COMPARISON:  Four days ago FINDINGS: Left PICC with tip at the upper cavoatrial junction. Dialysis catheter on the right in good position. Dual-chamber pacer leads from the right. Tracheostomy tube in place. Left pulmonary opacity and small pleural effusion is unchanged. No pneumothorax. IMPRESSION: Stable hardware positioning and residual left pleuroparenchymal  opacity. Electronically Signed   By: Monte Fantasia M.D.   On: 01/30/2020 09:45   DG Abd Portable 1V  Result Date: 01/30/2020 CLINICAL DATA:  Vomiting EXAM: PORTABLE ABDOMEN - 1 VIEW COMPARISON:  01/23/2020 FINDINGS: Percutaneous gastrostomy tube over the left upper quadrant. Generalized gaseous distension of bowel that is mildly improved. Stool is seen along the bilateral flank. Lower chest findings as described on  dedicated chest x-ray. Left upper quadrant coarse density is dystrophic appearing calcium at the medial spleen by prior CT. Linear density over the right lower quadrant which may relate to a craniectomy flap in the subcutaneous space. IMPRESSION: Continued gaseous distension of bowel suggesting ileus. There has been mild improvement from study 7 days ago. Electronically Signed   By: Monte Fantasia M.D.   On: 01/30/2020 09:50       Rito Lecomte   [facility] If 7PM-7AM, please contact night-coverage at [email or web address], Office  [phone]   01/30/2020, 11:41 AM  LOS: 47 days

## 2020-01-30 NOTE — Progress Notes (Signed)
RT entered room to assess pt and was able to suction lavage out multiple large bloody mucous plugs the size of large grapes from pt trach. Pts respiratory status is stable on ATC 10L 40% no distress noted at this time. RT will continue to monitor.

## 2020-01-30 NOTE — Progress Notes (Signed)
Report called Tammy Rn [phone]. Patient with no signs of discomfort at this time. Attempted to call spouse but Voicemail full about new bed assignment.

## 2020-01-30 NOTE — Progress Notes (Addendum)
Nutrition Follow-up  DOCUMENTATION CODES:   Not applicable  INTERVENTION:   Trend phosphorus   Change tube feeding:  -Osmolite 1.5 @ 20 ml via G-tube  -Increase to goal rate once able- 55 ml/hr -Prosource TF 45 mL BID Tube feeding to provide 2060 kcal, 105 grams of protein, 1746 mg phosphorus, and 1006 ml free water. Meets 100% of needs.   If vomiting continues may need to consider transitioning to J-tube.   NUTRITION DIAGNOSIS:   Inadequate oral intake related to acute illness as evidenced by NPO status.  Ongoing  GOAL:   Patient will meet greater than or equal to 90% of their needs  Addressed via TF  MONITOR:   Vent status, TF tolerance, Labs, Weight trends  REASON FOR ASSESSMENT:   Consult, Ventilator Enteral/tube feeding initiation and management  ASSESSMENT:   64 yo female admitted from [place] after being at [facility] in April in setting of R. MCA stroke requiring thrombectomy, developed significant brain edema requiring craniectomy. Pt developed L pleural effusion on Rehab requiring thoracentesis and ultimately required intubation due to resp failure from occlusion of L. Mainstem bronchus and large L hemothorax, bronch and chest tube placement. Pt did require Cortrak placement with TF initially but was taking po prior to readmission from Rehab. PMH DM, HTN, R. MCA stroke, SSS  5/29 Thoracentesis 5/30 Intubated, Chest tube 6/04Cortrak placed 6/06 Left VATS with drainage of left hemothorax 6/11 CRRT initiated 6/12 Seizures+ on EEG 6/20 CRRT stopped 6/21 First iHD 6/22 Trach placement 6/29 20 Fr. PEG tube placed , RIJ tunneled HD cath placed  Pt has been off vent for last 72 hrs. Had vomiting yesterday after TF re-started. Xray shows improving ileus today. Reglan added. Will trial lower fat/lower fiber formula to hopefully help with digestion. Phosphorus elevated. Discussed with nephrology, hold on binder for now.   Admission weight: 61.3 kg  Current  weight: 57.5 kg (stable over the last week)  Medications: aranesp, SS novolog, reglan, rena-vit Labs: Na 131 (L) Phosphorus 7.5 (H) CBG 66-191  Diet Order:   Diet Order            Diet NPO time specified  Diet effective now                 EDUCATION NEEDS:   Not appropriate for education at this time  Skin:  Skin Assessment: Reviewed RN Assessment Skin Integrity Issues:: Incisions, Other (Comment) Incisions: incision to groin, head, abdomen Other: MASD to coccyx, sacrum; anal fissure (rectal tube removed)  Last BM:  7/15  Height:   Ht Readings from Last 1 Encounters:  11/30/2019 5\' 4"  (1.626 m)    Weight:   Wt Readings from Last 1 Encounters:  01/30/20 57.5 kg    BMI:  Body mass index is 21.76 kg/m.  Estimated Nutritional Needs:   Kcal:  0177-9390 kcals  Protein:  90-120 g  Fluid:  1000 mL plus UOP   Mariana Single RD, LDN Clinical Nutrition Pager listed in [place]

## 2020-01-30 NOTE — Progress Notes (Signed)
Patient ID: Krystal Little, female   DOB: 1956-05-07, 64 y.o.   MRN: [id number] S:  No interval events. On TC. For HD today  O:BP (!) 128/58   Pulse 71   Temp 99.1 F (37.3 C) (Oral)   Resp 17   Ht '5\' 4"'  (1.626 m)   Wt 57.3 kg   SpO2 98%   BMI 21.68 kg/m   Intake/Output Summary (Last 24 hours) at 01/30/2020 0857 Last data filed at 01/29/2020 1800 Gross per 24 hour  Intake 360 ml  Output 1 ml  Net 359 ml   Intake/Output: I/O last 3 completed shifts: In: 1035 [Other:90; NG/GT:945] Out: 1 [Emesis/NG output:1]  Intake/Output this shift:  No intake/output data recorded. Weight change: 4 kg Gen: NAD, now awake currently R IJ TDC present CVS: RRR, no rub Resp: on TC, normal WOB Abd: benign Ext: trace edema.  Recent Labs  Lab 01/24/20 0313 01/25/20 0236 01/26/20 0303 01/27/20 0450 01/28/20 0551 01/29/20 0500 01/30/20 0506  NA 134* 129* 128* 131* 129* 132* 131*  K 4.1 4.7 4.7 4.0 4.3 4.1 4.6  CL 96* 93* 91* 95* 92* 93* 91*  CO2 '28 24 22 24 23 26 24  ' GLUCOSE 111* 112* 110* 182* 158* 133* 101*  BUN 41* 67* 94* 50* 85* 46* 78*  CREATININE 2.63* 3.87* 4.88* 3.28* 4.52* 3.04* 4.61*  ALBUMIN 1.3* 1.2* 1.3* 1.3* 1.3* 1.5* 1.5*  CALCIUM 8.1* 8.3* 8.1* 7.8* 8.2* 8.4* 8.7*  PHOS 4.1 6.1* 7.0* 4.3 5.4* 4.1 7.5*   Liver Function Tests: Recent Labs  Lab 01/28/20 0551 01/29/20 0500 01/30/20 0506  ALBUMIN 1.3* 1.5* 1.5*   No results for input(s): LIPASE, AMYLASE in the last 168 hours. No results for input(s): AMMONIA in the last 168 hours. CBC: Recent Labs  Lab 01/26/20 0303 01/26/20 0303 01/27/20 0450 01/27/20 0450 01/28/20 0551 01/28/20 1402 01/28/20 2148 01/29/20 0500 01/30/20 0506  WBC 14.6*   < > 17.6*   < > 21.7*  --   --  24.1* 23.0*  NEUTROABS 11.1*   < > 14.6*   < > 17.5*  --   --  18.1* 17.1*  HGB 8.2*   < > 8.5*   < > 8.5*   < > 8.2* 8.5* 8.1*  HCT 25.9*   < > 28.0*   < > 27.8*   < > 27.6* 28.4* 27.1*  MCV 92.5  --  94.6  --  95.5  --   --  95.9 94.8   PLT 205   < > 206   < > 224  --   --  241 248   < > = values in this interval not displayed.   Cardiac Enzymes: No results for input(s): CKTOTAL, CKMB, CKMBINDEX, TROPONINI in the last 168 hours. CBG: Recent Labs  Lab 01/29/20 1931 01/29/20 2353 01/30/20 0342 01/30/20 0801 01/30/20 0832  GLUCAP 149* 191* 144* 66* 143*    Iron Studies: No results for input(s): IRON, TIBC, TRANSFERRIN, FERRITIN in the last 72 hours. Studies/Results: No results found. Marland Kitchen amantadine  100 mg Per Tube BID  . amiodarone  200 mg Per Tube Daily  . [START ON 01/31/2020] aspirin  325 mg Per Tube Daily  . atorvastatin  40 mg Per Tube Daily  . chlorhexidine gluconate (MEDLINE KIT)  15 mL Mouth Rinse BID  . Chlorhexidine Gluconate Cloth  6 each Topical Q0600  . chlorpheniramine-HYDROcodone  10 mg of hydrocodone Oral Q12H  . darbepoetin (ARANESP) injection - DIALYSIS  100  mcg Intravenous Q Fri-HD  . feeding supplement (PROSource TF)  45 mL Per Tube Daily  . Gerhardt's butt cream   Topical TID  . guaiFENesin  15 mL Per Tube Q4H  . insulin aspart  0-20 Units Subcutaneous Q4H  . levETIRAcetam  750 mg Per Tube Q24H  . mouth rinse  15 mL Mouth Rinse 10 times per day  . multivitamin  1 tablet Per Tube QHS  . pantoprazole sodium  40 mg Per Tube QHS  . phenytoin  100 mg Per Tube Q8H  . sodium chloride flush  10-40 mL Intracatheter Q12H    BMET    Component Value Date/Time   NA 131 (L) 01/30/2020 0506   NA 140 08/18/2019 1120   K 4.6 01/30/2020 0506   CL 91 (L) 01/30/2020 0506   CO2 24 01/30/2020 0506   GLUCOSE 101 (H) 01/30/2020 0506   BUN 78 (H) 01/30/2020 0506   BUN 12 08/18/2019 1120   CREATININE 4.61 (H) 01/30/2020 0506   CREATININE 0.73 05/18/2016 0919   CALCIUM 8.7 (L) 01/30/2020 0506   GFRNONAA 9 (L) 01/30/2020 0506   GFRNONAA 89 05/18/2016 0919   GFRAA 11 (L) 01/30/2020 0506   GFRAA >89 05/18/2016 0919   CBC    Component Value Date/Time   WBC 23.0 (H) 01/30/2020 0506   RBC 2.86 (L)  01/30/2020 0506   HGB 8.1 (L) 01/30/2020 0506   HGB 15.2 08/18/2019 1120   HCT 27.1 (L) 01/30/2020 0506   HCT 45.9 08/18/2019 1120   PLT 248 01/30/2020 0506   PLT 168 08/18/2019 1120   MCV 94.8 01/30/2020 0506   MCV 94 08/18/2019 1120   MCH 28.3 01/30/2020 0506   MCHC 29.9 (L) 01/30/2020 0506   RDW 19.3 (H) 01/30/2020 0506   RDW 12.2 08/18/2019 1120   LYMPHSABS 2.8 01/30/2020 0506   MONOABS 2.1 (H) 01/30/2020 0506   EOSABS 0.6 (H) 01/30/2020 0506   BASOSABS 0.2 (H) 01/30/2020 0506    Assessment/Plan:  1. AKI- due to ischemic ATN in setting of hemorrhagic shock requiring pressor support and initiated on CRRT 6/11-6/20/21 and transitioned to IHD on 6/21 and has remained oliguric and dialysis dependent. 1. Will continue with HD on MWF schedule for now. 2. No signs of renal recovery at this time. 3. Next HD today, plan 2-3L as tolerated: 2K 2. Left sided hemothorax- s/p VATS on 6/6 and s/p removal of chest tubes 3. 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 4. VDRF- s/p trach 6/22/2, tol TC now. 5. Chronic atrial fibrillation- on amio and asa 6. HTN- stable; BPs quite variable 7. Seizure disorder- per neuro 8. Nutrition- s/p PEG by IR on 6/29 9. ABLA/Anemia of critical illness- s/p multiple transfusions, On ESA qFriday.  10. Hyponatremia - mild, stable; limit free water 11. Disposition- will need LTAC but poor overall prognosis. Continue with ongoing discussions with family regarding poor prognosis for meaningful recovery.  [facility]

## 2020-01-31 DIAGNOSIS — K922 Gastrointestinal hemorrhage, unspecified: Secondary | ICD-10-CM

## 2020-01-31 DIAGNOSIS — D72829 Elevated white blood cell count, unspecified: Secondary | ICD-10-CM

## 2020-01-31 LAB — CBC WITH DIFFERENTIAL/PLATELET
Abs Immature Granulocytes: 0.23 10*3/uL — ABNORMAL HIGH (ref 0.00–0.07)
Basophils Absolute: 0.1 10*3/uL (ref 0.0–0.1)
Basophils Relative: 1 %
Eosinophils Absolute: 0.4 10*3/uL (ref 0.0–0.5)
Eosinophils Relative: 2 %
HCT: 28 % — ABNORMAL LOW (ref 36.0–46.0)
Hemoglobin: 8.3 g/dL — ABNORMAL LOW (ref 12.0–15.0)
Immature Granulocytes: 1 %
Lymphocytes Relative: 10 %
Lymphs Abs: 2.1 10*3/uL (ref 0.7–4.0)
MCH: 28.6 pg (ref 26.0–34.0)
MCHC: 29.6 g/dL — ABNORMAL LOW (ref 30.0–36.0)
MCV: 96.6 fL (ref 80.0–100.0)
Monocytes Absolute: 1.5 10*3/uL — ABNORMAL HIGH (ref 0.1–1.0)
Monocytes Relative: 7 %
Neutro Abs: 16 10*3/uL — ABNORMAL HIGH (ref 1.7–7.7)
Neutrophils Relative %: 79 %
Platelets: 234 10*3/uL (ref 150–400)
RBC: 2.9 MIL/uL — ABNORMAL LOW (ref 3.87–5.11)
RDW: 19.6 % — ABNORMAL HIGH (ref 11.5–15.5)
WBC: 20.3 10*3/uL — ABNORMAL HIGH (ref 4.0–10.5)
nRBC: 0.1 % (ref 0.0–0.2)

## 2020-01-31 LAB — GLUCOSE, CAPILLARY
Glucose-Capillary: 121 mg/dL — ABNORMAL HIGH (ref 70–99)
Glucose-Capillary: 142 mg/dL — ABNORMAL HIGH (ref 70–99)
Glucose-Capillary: 168 mg/dL — ABNORMAL HIGH (ref 70–99)
Glucose-Capillary: 185 mg/dL — ABNORMAL HIGH (ref 70–99)
Glucose-Capillary: 231 mg/dL — ABNORMAL HIGH (ref 70–99)
Glucose-Capillary: 250 mg/dL — ABNORMAL HIGH (ref 70–99)
Glucose-Capillary: 270 mg/dL — ABNORMAL HIGH (ref 70–99)

## 2020-01-31 LAB — RENAL FUNCTION PANEL
Albumin: 1.4 g/dL — ABNORMAL LOW (ref 3.5–5.0)
Anion gap: 12 (ref 5–15)
BUN: 41 mg/dL — ABNORMAL HIGH (ref 8–23)
CO2: 25 mmol/L (ref 22–32)
Calcium: 8.5 mg/dL — ABNORMAL LOW (ref 8.9–10.3)
Chloride: 98 mmol/L (ref 98–111)
Creatinine, Ser: 2.91 mg/dL — ABNORMAL HIGH (ref 0.44–1.00)
GFR calc Af Amer: 19 mL/min — ABNORMAL LOW (ref >60)
GFR calc non Af Amer: 16 mL/min — ABNORMAL LOW (ref >60)
Glucose, Bld: 174 mg/dL — ABNORMAL HIGH (ref 70–99)
Phosphorus: 5.6 mg/dL — ABNORMAL HIGH (ref 2.5–4.6)
Potassium: 3.7 mmol/L (ref 3.5–5.1)
Sodium: 135 mmol/L (ref 135–145)

## 2020-01-31 MED ORDER — OSMOLITE 1.5 CAL PO LIQD
1000.0000 mL | ORAL | Status: DC
  Filled 2020-01-31 (×3): qty 1000

## 2020-01-31 NOTE — Progress Notes (Signed)
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  On 12/13/2019, PCCM was consulted for tachypnea and enlarging left-sided pleural effusion, thoracentesis showed hemothorax.  CT of the chest showed obstruction of her left mainstem bronchus and loculated left pleural effusion.  She was admitted  to ICU under PCCM service on 11/15/2019 for chest tube placement and bronchoscopy.  She underwent intrapleural TPA but required VATS eventually.  Her course was complicated by ICH, seizures, AKI, requiring CRRT.  Patient subsequently had tracheostomy placed on 12/22/2019, NG tube and tunneled vascular catheter placed on 01/13/2020.  Subsequently patient was started on intermittent hemodialysis.  Significant Hospital events 4/16 IR thrombectomy R MCA stroke 5/6 transfer to [facility]patient [facility] 5/29 PCCM consult left pleural effusion, thoracentesis 5/30 worsening tachypnea. Moved to ICU for intubation to facilitate bronchoscopy and chest tube placement 5/31 TPA/ pulmonzyme started for 3 days for persistent left effusion 6/01 Afebrile, low-dose Levophed, Precedex, Minimal output on chest tube- pulmozyme / tpa x 2,  6/02 pulmozyme and TPA completed 3 days 6/03 afib with RVR placed on amio gtt overnight , 1U PRBC for Hb 7.1  6/05 extubated successfully, Repeat TPA/Pulmonzyme given again  6/06 acute decompensation overnight, hemorrhagic shock >to OR for left VATS with 2L hemothorax drained. CT head with new ICH>neurosurgery  consulted 6/09 More awake but agitated on PSV weaning  6/12: Seizures + on EEG 6/22 trach 6/29 g tube and tunneled vascular catheter placed 7/2: Care transferred to [facility]  Subjective   Patient seen and examined, there is no vomiting over last 24 hours, she had bowel movement couple times yesterday, no blood in stools, she has been tolerating tube feeds at 20 cc/h.   Assessment/Plan:    Acute respiratory failure with hypoxia s/p tracheostomy -mucous plugging, suspected aspiration, left-sided loculated effusion complicated with hemothorax status post VATS decortication.   -Patient has a long hospitalization and was intubated but subsequently underwent tracheostomy on 01/06/2020.  Patient is requiring mechanical ventilation intermittently.  Wean off as able. Chest x-ray done 7/12  shows left-sided pleural effusion with mid and left lung base opacity. Also shows improved aeration in right lung fields. Will continue to monitor.  He has been off the vent for last 72 hours. -Patient appears to be with  leukocytosis, so chest x-ray was obtained, does show stable left lung opacity, so far I see no indication for antibiotics but will continue to monitor closely, blood cultures were sent, so far remains negative, leukocytosis is trending down, she is nontoxic-appearing, afebrile, so we will hold on antibiotics. -She remains on ATC at 40%.   Acute kidney injury-oliguric ischemic ATN secondary to hemorrhagic shock. -  Refractory to IV diuretics, started on CRRT from 12/26/2019 6 08/23/2019.  Subsequently started on hemodialysis from 01/05/2020.  Patient is status post right IJ tunneled dialysis catheter placement by IR on 01/13/2020.  Nephrology following and managing hemodialysis. - Patient is a poor candidate for long-term hemodialysis in this current state.  History of right MCA stroke-s/p thrombectomy  -with residual left-sided paralysis s/p right hemicraniectomy complicated by ICH after intrapleural  TPA. -  Globin remained stable with no recurrence of blood in stool, will resume aspirin tomorrow.  Dysphagia-patient without new ICH on 01/03/2020.  Manage medically as per neurosurgery recommendations.  G-tube was placed on 01/13/2020 and feeding was started.   -Patient had vomiting yesterday, held 2 feet yesterday, abdominal x-ray showing improving ileus, she will be resumed back on her tube feed and uptitrate rate slowly . -I will start on scheduled Reglan x6 doses to see if this helps with her ileus .  Hematochezia -Patient with Flexi-Seal tube, possible rectal ulcer, this has been discontinued, her aspirin has been stopped, GI input greatly appreciated . -H&H has been stable, no recurrence of bleed, she is back on aspirin. -Avoid Flexi-Seal in the future.  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  EEG had shown status epilepticus.  Continue phenytoin via G-tube.  Continue Keppra.  As needed Ativan.  Chronic atrial fibrillation-patient has history of sick sinus syndrome s/p pacemaker placement.  Continue amiodarone, aspirin.  Protein calorie malnutrition-tube feed , has been held given vomiting, but her vomiting provoked by coughing, now she is on Tussionex which is helping, tolerating tube feeds at 20 cc/h over last 24 hours, 4 mm for target of 40 today as long she tolerates.  Diabetes mellitus type 2 with hyperglycemia and hypoglycemia episodes-continue sliding scale insulin with NovoLog.  Hyponatremia-patient has mild hyponatremia, will hold free water.  Nephrology following.  Anemia chronic disease-secondary to chronic illness hemoglobin has been stable.  Patient  received multiple PRBCs transfusions.  Will transfuse for hemoglobin 7.0.  Hypertension-blood pressure is controlled.  Overall poor prognosis.  Patient is very deconditioned and has poor prognosis.  Palliative care was consulted and has signed off.  Patient will need LTAC placement.    SpO2: 98 % O2  Flow Rate (L/min): 10 L/min FiO2 (%): 40 %     Lab Results  Component Value Date   SARSCOV2NAA NEGATIVE 10/31/2019     CBG: Recent Labs  Lab 01/30/20 2024 01/31/20 0016 01/31/20 0429 01/31/20 0745 01/31/20 1154  GLUCAP 196* 168* 185* 142* 231*    CBC: Recent Labs  Lab 01/27/20 0450 01/27/20 0450 01/28/20 0551 01/28/20 0551 01/28/20 1402 01/28/20 2148 01/29/20 0500 01/30/20 0506 01/31/20 0625  WBC 17.6*  --  21.7*  --   --   --  24.1* 23.0* 20.3*  NEUTROABS 14.6*  --  17.5*  --   --   --  18.1* 17.1* 16.0*  HGB 8.5*   < > 8.5*   < > 8.6* 8.2* 8.5* 8.1* 8.3*  HCT 28.0*   < > 27.8*   < > 28.0* 27.6* 28.4* 27.1* 28.0*  MCV 94.6  --  95.5  --   --   --  95.9 94.8 96.6  PLT 206  --  224  --   --   --  241 248 234   < > = values in this interval not displayed.    Basic Metabolic Panel: Recent Labs  Lab 01/27/20 0450 01/28/20 0551 01/29/20 0500 01/30/20 0506 01/31/20 0625  NA 131* 129* 132* 131* 135  K 4.0 4.3 4.1 4.6 3.7  CL 95* 92* 93* 91* 98  CO2 _0 GLUCOSE 182* 158* 133* 101* 174*  BUN 50* 85* 46* 78* 41*  CREATININE 3.28* 4.52* 3.04* 4.61* 2.91*  CALCIUM 7.8* 8.2* 8.4* 8.7* 8.5*  PHOS 4.3 5.4* 4.1 7.5* 5.6*     Liver Function Tests: Recent Labs  Lab 01/27/20 0450 01/28/20 0551  01/29/20 0500 01/30/20 0506 01/31/20 0625  ALBUMIN 1.3* 1.3* 1.5* 1.5* 1.4*     DVT prophylaxis: SCDs  Code Status: Full code  Family Communication: none at [place]    Status is: Inpatient  Dispo: The patient is from: Home              Anticipated d/c is to: LTAC              Anticipated d/c date is: unknown              Patient currently not medically stable for discharge  Barrier to discharge-requiring hemodialysis, intermittent mechanical ventilation     Scheduled medications:  . amantadine  100 mg Per Tube BID  . amiodarone  200 mg Per Tube Daily  . aspirin  325 mg Per Tube Daily  . atorvastatin  40 mg Per Tube Daily  .  chlorhexidine gluconate (MEDLINE KIT)  15 mL Mouth Rinse BID  . Chlorhexidine Gluconate Cloth  6 each Topical Q0600  . chlorpheniramine-HYDROcodone  10 mg of hydrocodone Oral Q12H  . darbepoetin (ARANESP) injection - DIALYSIS  100 mcg Intravenous Q Fri-HD  . feeding supplement (PROSource TF)  45 mL Per Tube BID  . Gerhardt's butt cream   Topical TID  . guaiFENesin  15 mL Per Tube Q4H  . insulin aspart  0-20 Units Subcutaneous Q4H  . levETIRAcetam  750 mg Per Tube Q24H  . mouth rinse  15 mL Mouth Rinse 10 times per day  . metoCLOPramide (REGLAN) injection  5 mg Intravenous Q6H  . multivitamin  1 tablet Per Tube QHS  . pantoprazole sodium  40 mg Per Tube QHS  . phenytoin  100 mg Per Tube Q8H  . sodium chloride flush  10-40 mL Intracatheter Q12H    Consultants:  PCCM  Palliative care  Nephrology  Neurology  Neurosurgery  IR  Procedures: 12/13/2019 thoracentesis: 1.5 L bloody fluid Bronchoscopy on 12/11/2019 ETT: 11/19/2019-01/06/2020 Tracheostomy: 01/06/2020 onwards Left pigtail chest tube 11/26/2019-12/20/2019 [facility] HD cath from 12/27/2019-01/13/2020 G-tube and tunneled vascular catheter placement on 01/13/2020  Overnight EEG on 12/27/2019: Right frontocentral seizures x2   Antibiotics:   Anti-infectives (From admission, onward)   Start     Dose/Rate Route Frequency Ordered Stop   01/15/20 0745  ceFAZolin (ANCEF) IVPB 2g/100 mL premix  Status:  Discontinued        2 g 200 mL/hr over 30 Minutes Intravenous  Once 01/15/20 0739 01/18/20 0938   01/13/20 1645  ceFAZolin (ANCEF) IVPB 2g/100 mL premix        2 g 200 mL/hr over 30 Minutes Intravenous To Radiology 01/13/20 1630 01/13/20 1640   01/13/20 1630  ceFAZolin (ANCEF) IVPB 2g/100 mL premix  Status:  Discontinued        2 g 200 mL/hr over 30 Minutes Intravenous To ShortStay Surgical 01/13/20 1625 01/13/20 1906   01/13/20 0800  ceFAZolin (ANCEF) IVPB 2g/100 mL premix        2 g 200 mL/hr over 30 Minutes Intravenous To  Radiology 01/12/20 1007 01/13/20 1836   01/12/20 1030  ceFAZolin (ANCEF) IVPB 2g/100 mL premix  Status:  Discontinued        2 g 200 mL/hr over 30 Minutes Intravenous To Radiology 01/12/20 0927 01/12/20 1007   12/27/19 1000  ceFEPIme (MAXIPIME) 2 g in sodium chloride 0.9 % 100 mL IVPB  Status:  Discontinued        2 g 200 mL/hr over 30 Minutes Intravenous Every 12 hours 12/26/19  1412 12/27/19 1017   12/26/19 1000  ceFEPIme (MAXIPIME) 1 g in sodium chloride 0.9 % 100 mL IVPB  Status:  Discontinued        1 g 200 mL/hr over 30 Minutes Intravenous Every 24 hours 12/25/19 1152 12/26/19 1412   01/04/2020 1244  ceFAZolin (ANCEF) IVPB 2g/100 mL premix  Status:  Discontinued        2 g 200 mL/hr over 30 Minutes Intravenous 30 min pre-op 12/22/2019 1244 01/10/2020 1823   01/14/2020 0600  ceFEPIme (MAXIPIME) 1 g in sodium chloride 0.9 % 100 mL IVPB  Status:  Discontinued        1 g 200 mL/hr over 30 Minutes Intravenous Every 8 hours 12/24/2019 0337 12/23/2019 0340   01/05/2020 0400  vancomycin (VANCOCIN) IVPB 1000 mg/200 mL premix        1,000 mg 200 mL/hr over 60 Minutes Intravenous  Once 01/01/2020 0337 01/14/2020 0653   12/23/2019 0400  ceFEPIme (MAXIPIME) 2 g in sodium chloride 0.9 % 100 mL IVPB  Status:  Discontinued        2 g 200 mL/hr over 30 Minutes Intravenous Every 24 hours 12/19/2019 0340 12/25/19 1152   12/20/19 2200  amoxicillin-clavulanate (AUGMENTIN) 500-125 MG per tablet 500 mg  Status:  Discontinued        1 tablet Oral 2 times daily 12/20/19 1412 01/10/2020 0337   12/20/19 1415  amoxicillin-clavulanate (AUGMENTIN) 875-125 MG per tablet 1 tablet  Status:  Discontinued        1 tablet Oral Every 12 hours 12/20/19 1407 12/20/19 1411   11/30/2019 1245  piperacillin-tazobactam (ZOSYN) IVPB 3.375 g  Status:  Discontinued        3.375 g 12.5 mL/hr over 240 Minutes Intravenous Every 8 hours 12/15/2019 1235 12/20/19 1407       Objective   Vitals:   01/31/20 0745 01/31/20 1155 01/31/20 1156 01/31/20 1200   BP: (!) 150/57 (!) 143/47    Pulse: 81 72 74 76  Resp: 19 20 (!) 22 19  Temp: 98.5 F (36.9 C) 98.3 F (36.8 C)    TempSrc: Axillary Axillary    SpO2: 97% 98% 97% 98%  Weight:      Height:        Intake/Output Summary (Last 24 hours) at 01/31/2020 1254 Last data filed at 01/31/2020 0600 Gross per 24 hour  Intake 591 ml  Output 2500 ml  Net -1909 ml    07/15 1901 - 07/17 0700 In: 947  Out: 2500   Filed Weights   01/30/20 0500 01/30/20 1130 01/31/20 0500  Weight: 57.3 kg 57.5 kg 54 kg    Physical Examination:  Right side craniotomy, wakes up intermittently,  Trach present,  Symmetrical chest movement bilaterally, rhonchi in the left lung Regular rate And Rhythm, No Rubs Murmurs or Gallops Abdomen soft, bowel sounds present, PEG present Extremities with no edema, clubbing or cyanosis, she is flaccid on the left side   Data Reviewed:   No results found for this or any previous visit (from the past 240 hour(s)).  No results for input(s): LIPASE, AMYLASE in the last 168 hours. No results for input(s): AMMONIA in the last 168 hours.  Cardiac Enzymes: No results for input(s): CKTOTAL, CKMB, CKMBINDEX, TROPONINI in the last 168 hours. BNP (last 3 results) Recent Labs    11/11/19 2226  BNP 93.9    Studies:  DG Chest Port 1 View  Result Date: 01/30/2020 CLINICAL DATA:  Leukocytosis EXAM: PORTABLE CHEST 1 VIEW  COMPARISON:  Four days ago FINDINGS: Left PICC with tip at the upper cavoatrial junction. Dialysis catheter on the right in good position. Dual-chamber pacer leads from the right. Tracheostomy tube in place. Left pulmonary opacity and small pleural effusion is unchanged. No pneumothorax. IMPRESSION: Stable hardware positioning and residual left pleuroparenchymal opacity. Electronically Signed   By: Monte Fantasia M.D.   On: 01/30/2020 09:45   DG Abd Portable 1V  Result Date: 01/30/2020 CLINICAL DATA:  Vomiting EXAM: PORTABLE ABDOMEN - 1 VIEW COMPARISON:   01/23/2020 FINDINGS: Percutaneous gastrostomy tube over the left upper quadrant. Generalized gaseous distension of bowel that is mildly improved. Stool is seen along the bilateral flank. Lower chest findings as described on dedicated chest x-ray. Left upper quadrant coarse density is dystrophic appearing calcium at the medial spleen by prior CT. Linear density over the right lower quadrant which may relate to a craniectomy flap in the subcutaneous space. IMPRESSION: Continued gaseous distension of bowel suggesting ileus. There has been mild improvement from study 7 days ago. Electronically Signed   By: Monte Fantasia M.D.   On: 01/30/2020 09:50       Shaniqwa Horsman   [facility] If 7PM-7AM, please contact night-coverage at [email or web address], Office  [phone]   01/31/2020, 12:54 PM  LOS: 48 days

## 2020-01-31 NOTE — Progress Notes (Signed)
Patient ID: Krystal Little, female   DOB: 07-01-1956, 64 y.o.   MRN: [id number] S:  Out of ICU, on TC overnight HD yesterday: 2.5L UF  O:BP (!) 111/44 (BP Location: Right Leg)   Pulse 70   Temp 99.1 F (37.3 C) (Axillary)   Resp (!) 27   Ht _0  (1.626 m)   Wt 54 kg   SpO2 97%   BMI 20.43 kg/m   Intake/Output Summary (Last 24 hours) at 01/31/2020 1037 Last data filed at 01/31/2020 0600 Gross per 24 hour  Intake 591 ml  Output 2500 ml  Net -1909 ml   Intake/Output: I/O last 3 completed shifts: In: 591 [NG/GT:591] Out: 2500 [Other:2500]  Intake/Output this shift:  No intake/output data recorded. Weight change: 0.2 kg Gen: NAD R IJ TDC present CVS: RRR, no rub Resp: on TC, normal WOB Abd: benign Ext: trace edema.  Recent Labs  Lab 01/25/20 0236 01/26/20 0303 01/27/20 0450 01/28/20 0551 01/29/20 0500 01/30/20 0506 01/31/20 0625  NA 129* 128* 131* 129* 132* 131* 135  K 4.7 4.7 4.0 4.3 4.1 4.6 3.7  CL 93* 91* 95* 92* 93* 91* 98  CO2 _1 GLUCOSE 112* 110* 182* 158* 133* 101* 174*  BUN 67* 94* 50* 85* 46* 78* 41*  CREATININE 3.87* 4.88* 3.28* 4.52* 3.04* 4.61* 2.91*  ALBUMIN 1.2* 1.3* 1.3* 1.3* 1.5* 1.5* 1.4*  CALCIUM 8.3* 8.1* 7.8* 8.2* 8.4* 8.7* 8.5*  PHOS 6.1* 7.0* 4.3 5.4* 4.1 7.5* 5.6*   Liver Function Tests: Recent Labs  Lab 01/29/20 0500 01/30/20 0506 01/31/20 0625  ALBUMIN 1.5* 1.5* 1.4*   No results for input(s): LIPASE, AMYLASE in the last 168 hours. No results for input(s): AMMONIA in the last 168 hours. CBC: Recent Labs  Lab 01/27/20 0450 01/27/20 0450 01/28/20 0551 01/28/20 1402 01/29/20 0500 01/30/20 0506 01/31/20 0625  WBC 17.6*   < > 21.7*  --  24.1* 23.0* 20.3*  NEUTROABS 14.6*   < > 17.5*  --  18.1* 17.1* 16.0*  HGB 8.5*   < > 8.5*   < > 8.5* 8.1* 8.3*  HCT 28.0*   < > 27.8*   < > 28.4* 27.1* 28.0*  MCV 94.6  --  95.5  --  95.9 94.8 96.6  PLT 206   < > 224  --  241 248 234   < > = values in this interval not  displayed.   Cardiac Enzymes: No results for input(s): CKTOTAL, CKMB, CKMBINDEX, TROPONINI in the last 168 hours. CBG: Recent Labs  Lab 01/30/20 1641 01/30/20 2024 01/31/20 0016 01/31/20 0429 01/31/20 0745  GLUCAP 101* 196* 168* 185* 142*    Iron Studies: No results for input(s): IRON, TIBC, TRANSFERRIN, FERRITIN in the last 72 hours. Studies/Results: DG Chest Port 1 View  Result Date: 01/30/2020 CLINICAL DATA:  Leukocytosis EXAM: PORTABLE CHEST 1 VIEW COMPARISON:  Four days ago FINDINGS: Left PICC with tip at the upper cavoatrial junction. Dialysis catheter on the right in good position. Dual-chamber pacer leads from the right. Tracheostomy tube in place. Left pulmonary opacity and small pleural effusion is unchanged. No pneumothorax. IMPRESSION: Stable hardware positioning and residual left pleuroparenchymal opacity. Electronically Signed   By: Monte Fantasia M.D.   On: 01/30/2020 09:45   DG Abd Portable 1V  Result Date: 01/30/2020 CLINICAL DATA:  Vomiting EXAM: PORTABLE ABDOMEN - 1 VIEW COMPARISON:  01/23/2020 FINDINGS: Percutaneous gastrostomy tube over the left upper quadrant. Generalized gaseous distension of  bowel that is mildly improved. Stool is seen along the bilateral flank. Lower chest findings as described on dedicated chest x-ray. Left upper quadrant coarse density is dystrophic appearing calcium at the medial spleen by prior CT. Linear density over the right lower quadrant which may relate to a craniectomy flap in the subcutaneous space. IMPRESSION: Continued gaseous distension of bowel suggesting ileus. There has been mild improvement from study 7 days ago. Electronically Signed   By: Monte Fantasia M.D.   On: 01/30/2020 09:50   . amantadine  100 mg Per Tube BID  . amiodarone  200 mg Per Tube Daily  . aspirin  325 mg Per Tube Daily  . atorvastatin  40 mg Per Tube Daily  . chlorhexidine gluconate (MEDLINE KIT)  15 mL Mouth Rinse BID  . Chlorhexidine Gluconate Cloth  6  each Topical Q0600  . chlorpheniramine-HYDROcodone  10 mg of hydrocodone Oral Q12H  . darbepoetin (ARANESP) injection - DIALYSIS  100 mcg Intravenous Q Fri-HD  . feeding supplement (PROSource TF)  45 mL Per Tube BID  . Gerhardt's butt cream   Topical TID  . guaiFENesin  15 mL Per Tube Q4H  . insulin aspart  0-20 Units Subcutaneous Q4H  . levETIRAcetam  750 mg Per Tube Q24H  . mouth rinse  15 mL Mouth Rinse 10 times per day  . metoCLOPramide (REGLAN) injection  5 mg Intravenous Q6H  . multivitamin  1 tablet Per Tube QHS  . pantoprazole sodium  40 mg Per Tube QHS  . phenytoin  100 mg Per Tube Q8H  . sodium chloride flush  10-40 mL Intracatheter Q12H    BMET    Component Value Date/Time   NA 135 01/31/2020 0625   NA 140 08/18/2019 1120   K 3.7 01/31/2020 0625   CL 98 01/31/2020 0625   CO2 25 01/31/2020 0625   GLUCOSE 174 (H) 01/31/2020 0625   BUN 41 (H) 01/31/2020 0625   BUN 12 08/18/2019 1120   CREATININE 2.91 (H) 01/31/2020 0625   CREATININE 0.73 05/18/2016 0919   CALCIUM 8.5 (L) 01/31/2020 0625   GFRNONAA 16 (L) 01/31/2020 0625   GFRNONAA 89 05/18/2016 0919   GFRAA 19 (L) 01/31/2020 0625   GFRAA >89 05/18/2016 0919   CBC    Component Value Date/Time   WBC 20.3 (H) 01/31/2020 0625   RBC 2.90 (L) 01/31/2020 0625   HGB 8.3 (L) 01/31/2020 0625   HGB 15.2 08/18/2019 1120   HCT 28.0 (L) 01/31/2020 0625   HCT 45.9 08/18/2019 1120   PLT 234 01/31/2020 0625   PLT 168 08/18/2019 1120   MCV 96.6 01/31/2020 0625   MCV 94 08/18/2019 1120   MCH 28.6 01/31/2020 0625   MCHC 29.6 (L) 01/31/2020 0625   RDW 19.6 (H) 01/31/2020 0625   RDW 12.2 08/18/2019 1120   LYMPHSABS 2.1 01/31/2020 0625   MONOABS 1.5 (H) 01/31/2020 0625   EOSABS 0.4 01/31/2020 0625   BASOSABS 0.1 01/31/2020 0625    Assessment/Plan:  1. AKI- due to ischemic ATN in setting of hemorrhagic shock requiring pressor support and initiated on CRRT 6/11-6/20/21 and transitioned to IHD on 6/21 and has remained  oliguric and dialysis dependent. 1. Will continue with HD on MWF schedule for now. 2. No signs of renal recovery at this time. 3. Next HD 7/19 2. Left sided hemothorax- s/p VATS on 6/6 and s/p removal of chest tubes 3. RMCA stroke- s/p thrombectomy 4/16 c/b worsening cerebral edema with mass effect s/p decompressive right  hemicraniectomy and placement of bone flap on 11/02/19, new ICH 12/16/2019. 4. VDRF- s/p trach 6/22/2, tol TC now. 5. Chronic atrial fibrillation- on amio and asa 6. HTN- stable; BPs quite variable 7. Seizure disorder- per neuro, Keppra and phenytoin 8. Nutrition- s/p PEG by IR on 6/29 9. ABLA/Anemia of critical illness- s/p multiple transfusions, On ESA qFriday.  10. Hyponatremia - mild, stable; limit free water 11. Disposition- will need [facility]

## 2020-02-01 ENCOUNTER — Inpatient Hospital Stay (HOSPITAL_COMMUNITY): Payer: Medicare Other

## 2020-02-01 DIAGNOSIS — I469 Cardiac arrest, cause unspecified: Secondary | ICD-10-CM

## 2020-02-01 LAB — POCT I-STAT 7, (LYTES, BLD GAS, ICA,H+H)
Acid-base deficit: 6 mmol/L — ABNORMAL HIGH (ref 0.0–2.0)
Acid-base deficit: 9 mmol/L — ABNORMAL HIGH (ref 0.0–2.0)
Acid-base deficit: 13 mmol/L — ABNORMAL HIGH (ref 0.0–2.0)
Bicarbonate: 21.2 mmol/L (ref 20.0–28.0)
Bicarbonate: 17.2 mmol/L — ABNORMAL LOW (ref 20.0–28.0)
Bicarbonate: 16.2 mmol/L — ABNORMAL LOW (ref 20.0–28.0)
Calcium, Ion: 1.05 mmol/L — ABNORMAL LOW (ref 1.15–1.40)
Calcium, Ion: 0.99 mmol/L — ABNORMAL LOW (ref 1.15–1.40)
Calcium, Ion: 0.92 mmol/L — ABNORMAL LOW (ref 1.15–1.40)
HCT: 31 % — ABNORMAL LOW (ref 36.0–46.0)
HCT: 35 % — ABNORMAL LOW (ref 36.0–46.0)
HCT: 30 % — ABNORMAL LOW (ref 36.0–46.0)
Hemoglobin: 10.5 g/dL — ABNORMAL LOW (ref 12.0–15.0)
Hemoglobin: 11.9 g/dL — ABNORMAL LOW (ref 12.0–15.0)
Hemoglobin: 10.2 g/dL — ABNORMAL LOW (ref 12.0–15.0)
O2 Saturation: 85 %
O2 Saturation: 100 %
O2 Saturation: 98 %
Potassium: 6.4 mmol/L (ref 3.5–5.1)
Potassium: 6.5 mmol/L (ref 3.5–5.1)
Potassium: 7.4 mmol/L (ref 3.5–5.1)
Sodium: 141 mmol/L (ref 135–145)
Sodium: 140 mmol/L (ref 135–145)
Sodium: 147 mmol/L — ABNORMAL HIGH (ref 135–145)
TCO2: 23 mmol/L (ref 22–32)
TCO2: 18 mmol/L — ABNORMAL LOW (ref 22–32)
TCO2: 18 mmol/L — ABNORMAL LOW (ref 22–32)
pCO2 arterial: 48.5 mmHg — ABNORMAL HIGH (ref 32.0–48.0)
pCO2 arterial: 36.2 mmHg (ref 32.0–48.0)
pCO2 arterial: 51.9 mmHg — ABNORMAL HIGH (ref 32.0–48.0)
pH, Arterial: 7.248 — ABNORMAL LOW (ref 7.350–7.450)
pH, Arterial: 7.286 — ABNORMAL LOW (ref 7.350–7.450)
pH, Arterial: 7.102 — CL (ref 7.350–7.450)
pO2, Arterial: 58 mmHg — ABNORMAL LOW (ref 83.0–108.0)
pO2, Arterial: 201 mmHg — ABNORMAL HIGH (ref 83.0–108.0)
pO2, Arterial: 152 mmHg — ABNORMAL HIGH (ref 83.0–108.0)

## 2020-02-01 LAB — GLUCOSE, CAPILLARY
Glucose-Capillary: 172 mg/dL — ABNORMAL HIGH (ref 70–99)
Glucose-Capillary: 18 mg/dL — CL (ref 70–99)
Glucose-Capillary: <10 mg/dL — CL (ref 70–99)
Glucose-Capillary: 203 mg/dL — ABNORMAL HIGH (ref 70–99)
Glucose-Capillary: 81 mg/dL (ref 70–99)
Glucose-Capillary: 102 mg/dL — ABNORMAL HIGH (ref 70–99)

## 2020-02-01 LAB — RENAL FUNCTION PANEL
Albumin: 1.4 g/dL — ABNORMAL LOW (ref 3.5–5.0)
Anion gap: 16 — ABNORMAL HIGH (ref 5–15)
BUN: 73 mg/dL — ABNORMAL HIGH (ref 8–23)
CO2: 22 mmol/L (ref 22–32)
Calcium: 8.7 mg/dL — ABNORMAL LOW (ref 8.9–10.3)
Chloride: 98 mmol/L (ref 98–111)
Creatinine, Ser: 4.62 mg/dL — ABNORMAL HIGH (ref 0.44–1.00)
GFR calc Af Amer: 11 mL/min — ABNORMAL LOW (ref >60)
GFR calc non Af Amer: 9 mL/min — ABNORMAL LOW (ref >60)
Glucose, Bld: 29 mg/dL — CL (ref 70–99)
Phosphorus: 9.5 mg/dL — ABNORMAL HIGH (ref 2.5–4.6)
Potassium: 5.3 mmol/L — ABNORMAL HIGH (ref 3.5–5.1)
Sodium: 136 mmol/L (ref 135–145)

## 2020-02-01 LAB — CBC WITH DIFFERENTIAL/PLATELET
Abs Immature Granulocytes: 0.88 10*3/uL — ABNORMAL HIGH (ref 0.00–0.07)
Basophils Absolute: 0.1 10*3/uL (ref 0.0–0.1)
Basophils Relative: 0 %
Eosinophils Absolute: 0.2 10*3/uL (ref 0.0–0.5)
Eosinophils Relative: 1 %
HCT: 30.6 % — ABNORMAL LOW (ref 36.0–46.0)
Hemoglobin: 8.9 g/dL — ABNORMAL LOW (ref 12.0–15.0)
Immature Granulocytes: 3 %
Lymphocytes Relative: 7 %
Lymphs Abs: 1.9 10*3/uL (ref 0.7–4.0)
MCH: 28.3 pg (ref 26.0–34.0)
MCHC: 29.1 g/dL — ABNORMAL LOW (ref 30.0–36.0)
MCV: 97.5 fL (ref 80.0–100.0)
Monocytes Absolute: 2.1 10*3/uL — ABNORMAL HIGH (ref 0.1–1.0)
Monocytes Relative: 7 %
Neutro Abs: 23.7 10*3/uL — ABNORMAL HIGH (ref 1.7–7.7)
Neutrophils Relative %: 82 %
Platelets: 247 10*3/uL (ref 150–400)
RBC: 3.14 MIL/uL — ABNORMAL LOW (ref 3.87–5.11)
RDW: 20 % — ABNORMAL HIGH (ref 11.5–15.5)
WBC: 29 10*3/uL — ABNORMAL HIGH (ref 4.0–10.5)
nRBC: 0.9 % — ABNORMAL HIGH (ref 0.0–0.2)

## 2020-02-01 LAB — BASIC METABOLIC PANEL
Anion gap: 20 — ABNORMAL HIGH (ref 5–15)
BUN: 81 mg/dL — ABNORMAL HIGH (ref 8–23)
CO2: 14 mmol/L — ABNORMAL LOW (ref 22–32)
Calcium: 7.5 mg/dL — ABNORMAL LOW (ref 8.9–10.3)
Chloride: 100 mmol/L (ref 98–111)
Creatinine, Ser: 4.93 mg/dL — ABNORMAL HIGH (ref 0.44–1.00)
GFR calc Af Amer: 10 mL/min — ABNORMAL LOW (ref >60)
GFR calc non Af Amer: 9 mL/min — ABNORMAL LOW (ref >60)
Glucose, Bld: 288 mg/dL — ABNORMAL HIGH (ref 70–99)
Potassium: 6.2 mmol/L — ABNORMAL HIGH (ref 3.5–5.1)
Sodium: 134 mmol/L — ABNORMAL LOW (ref 135–145)

## 2020-02-01 LAB — LACTIC ACID, PLASMA
Lactic Acid, Venous: 2.5 mmol/L (ref 0.5–1.9)
Lactic Acid, Venous: 2 mmol/L (ref 0.5–1.9)

## 2020-02-01 LAB — BLOOD GAS, ARTERIAL
Acid-base deficit: 4.5 mmol/L — ABNORMAL HIGH (ref 0.0–2.0)
Bicarbonate: 20.8 mmol/L (ref 20.0–28.0)
Drawn by: 28340
FIO2: 60
O2 Saturation: 94.1 %
Patient temperature: 37
pCO2 arterial: 43.1 mmHg (ref 32.0–48.0)
pH, Arterial: 7.304 — ABNORMAL LOW (ref 7.350–7.450)
pO2, Arterial: 85.6 mmHg (ref 83.0–108.0)

## 2020-02-01 LAB — PROCALCITONIN: Procalcitonin: 12.53 ng/mL

## 2020-02-01 LAB — MAGNESIUM: Magnesium: 3.3 mg/dL — ABNORMAL HIGH (ref 1.7–2.4)

## 2020-02-01 LAB — CORTISOL
Cortisol, Plasma: 46.7 ug/dL
Cortisol, Plasma: 42.1 ug/dL

## 2020-02-01 MED ORDER — ACETAMINOPHEN 500 MG PO TABS: 500.0000 mg | ORAL_TABLET | Freq: Once | ORAL | Status: DC

## 2020-02-01 MED ORDER — PRISMASOL BGK 4/2.5 32-4-2.5 MEQ/L IV SOLN: INTRAVENOUS | Status: DC

## 2020-02-01 MED ORDER — LACTATED RINGERS IV BOLUS
250.0000 mL | Freq: Once | INTRAVENOUS | Status: AC
  Administered 2020-02-01: 250 mL via INTRAVENOUS

## 2020-02-01 MED ORDER — DEXTROSE 50 % IV SOLN
1.0000 | Freq: Once | INTRAVENOUS | Status: AC
  Administered 2020-02-01: 50 mL via INTRAVENOUS

## 2020-02-01 MED ORDER — DEXTROSE 50 % IV SOLN
INTRAVENOUS | Status: AC
  Administered 2020-02-01: 50 mL via INTRAVENOUS
  Filled 2020-02-01: qty 50

## 2020-02-01 MED ORDER — DEXTROSE-NACL 5-0.45 % IV SOLN: INTRAVENOUS | Status: DC

## 2020-02-01 MED ORDER — HEPARIN SODIUM (PORCINE) 1000 UNIT/ML DIALYSIS: 1000.0000 [IU] | INTRAMUSCULAR | Status: DC | PRN

## 2020-02-01 MED ORDER — DEXAMETHASONE SODIUM PHOSPHATE 4 MG/ML IJ SOLN
2.0000 mg | Freq: Once | INTRAMUSCULAR | Status: AC
  Administered 2020-02-01: 2 mg via INTRAVENOUS
  Filled 2020-02-01: qty 1

## 2020-02-01 MED ORDER — DOCUSATE SODIUM 100 MG PO CAPS: 100.0000 mg | ORAL_CAPSULE | Freq: Two times a day (BID) | ORAL | Status: DC | PRN

## 2020-02-01 MED ORDER — DEXTROSE 50 % IV SOLN: 50.0000 mL | Freq: Once | INTRAVENOUS | Status: AC

## 2020-02-01 MED ORDER — NOREPINEPHRINE 4 MG/250ML-% IV SOLN
0.0000 ug/min | INTRAVENOUS | Status: DC
  Administered 2020-02-01: 10 ug/min via INTRAVENOUS
  Administered 2020-02-01: 5 ug/min via INTRAVENOUS
  Filled 2020-02-01: qty 250

## 2020-02-01 MED ORDER — VANCOMYCIN HCL IN DEXTROSE 1-5 GM/200ML-% IV SOLN
1000.0000 mg | INTRAVENOUS | Status: AC
  Administered 2020-02-01: 1000 mg via INTRAVENOUS
  Filled 2020-02-01: qty 200

## 2020-02-01 MED ORDER — SODIUM CHLORIDE 0.9 % IV SOLN
500.0000 mg | INTRAVENOUS | Status: DC
  Administered 2020-02-01: 500 mg via INTRAVENOUS
  Filled 2020-02-01 (×2): qty 0.5

## 2020-02-01 MED ORDER — PIPERACILLIN-TAZOBACTAM IN DEX 2-0.25 GM/50ML IV SOLN
2.2500 g | Freq: Three times a day (TID) | INTRAVENOUS | Status: DC
  Filled 2020-02-01 (×2): qty 50

## 2020-02-01 MED ORDER — PRISMASOL BGK 4/2.5 32-4-2.5 MEQ/L REPLACEMENT SOLN: Status: DC

## 2020-02-01 MED ORDER — VANCOMYCIN HCL IN DEXTROSE 500-5 MG/100ML-% IV SOLN: 500.0000 mg | INTRAVENOUS | Status: DC

## 2020-02-01 MED ORDER — POLYETHYLENE GLYCOL 3350 17 G PO PACK: 17.0000 g | PACK | Freq: Every day | ORAL | Status: DC | PRN

## 2020-02-01 MED ORDER — IPRATROPIUM-ALBUTEROL 0.5-2.5 (3) MG/3ML IN SOLN
RESPIRATORY_TRACT | Status: AC
  Administered 2020-02-01: 3 mL
  Filled 2020-02-01: qty 3

## 2020-02-01 MED ORDER — SODIUM BICARBONATE 8.4 % IV SOLN: 50.0000 meq | Freq: Once | INTRAVENOUS | Status: DC

## 2020-02-01 MED ORDER — DEXTROSE 50 % IV SOLN: 1.0000 | Freq: Once | INTRAVENOUS | Status: AC

## 2020-02-01 MED ORDER — COSYNTROPIN 0.25 MG IJ SOLR
0.2500 mg | Freq: Once | INTRAMUSCULAR | Status: AC
  Administered 2020-02-01: 0.25 mg via INTRAVENOUS
  Filled 2020-02-01: qty 0.25

## 2020-02-01 MED ORDER — NOREPINEPHRINE 4 MG/250ML-% IV SOLN
2.0000 ug/min | INTRAVENOUS | Status: DC
  Filled 2020-02-01: qty 250

## 2020-02-01 MED ORDER — LACTATED RINGERS IV BOLUS
500.0000 mL | Freq: Once | INTRAVENOUS | Status: AC
  Administered 2020-02-01: 500 mL via INTRAVENOUS

## 2020-02-01 MED ORDER — ALBUMIN HUMAN 25 % IV SOLN
12.5000 g | Freq: Once | INTRAVENOUS | Status: AC
  Administered 2020-02-01: 12.5 g via INTRAVENOUS
  Filled 2020-02-01 (×2): qty 50

## 2020-02-01 MED ORDER — SODIUM CHLORIDE 0.9 % FOR CRRT: INTRAVENOUS_CENTRAL | Status: DC | PRN

## 2020-02-01 MED ORDER — DEXTROSE 50 % IV SOLN
INTRAVENOUS | Status: AC
  Filled 2020-02-01: qty 50

## 2020-02-01 MED ORDER — SODIUM CHLORIDE 0.9 % IV SOLN: 250.0000 mL | INTRAVENOUS | Status: DC

## 2020-02-01 MED ORDER — PRISMASOL BGK 0/2.5 32-2.5 MEQ/L IV SOLN: INTRAVENOUS | Status: DC

## 2020-02-01 MED FILL — Medication: Qty: 1 | Status: AC

## 2020-02-02 LAB — GLUCOSE, CAPILLARY: Glucose-Capillary: 107 mg/dL — ABNORMAL HIGH (ref 70–99)

## 2020-02-03 LAB — CULTURE, RESPIRATORY W GRAM STAIN

## 2020-02-04 LAB — CULTURE, BLOOD (ROUTINE X 2)
Culture: NO GROWTH
Culture: NO GROWTH

## 2020-02-06 LAB — CULTURE, BLOOD (ROUTINE X 2)
Culture: NO GROWTH
Culture: NO GROWTH

## 2020-02-13 NOTE — Discharge Summary (Signed)
NAME:  Krystal Little, MRN:  [id number], DOB:  10/24/1955, LOS: 66 ADMISSION DATE:  11/27/2019, CONSULTATION DATE:  12/13/2019 REFERRING MD:  Letta Pate, CHIEF COMPLAINT:  Chest congestion DATE OF DEATH:  02/08/20  Brief History: Krystal Little was a 64y/o F with an extensive cardiac history who was previously admitted to [facility] in April in the setting of a right MCA stroke, had a right interventional radiology guided thrombectomy, later had significant brain edema and underwent craniectomy, ultimately transferred to inpatient rehab.  Past Medical History: Status post mitral valve repair 2001 Status post cardiac pacemaker 2001 History of sick sinus syndrome Hypertension Diabetes mellitus type 2 Chronic atrial fibrillation Right MCA stroke 2021  Significant Hospital Events: 4/16 IR thrombectomy R MCA stroke 4/25 PCCM sign off 5/06 transfer to [facility] Inpatient Rehab 5/29 PCCM consult left pleural effusion, thoracentesis 5/30 worsening tachypnea. Move to ICU for intubation to facilitate bronchoscopy and chest tube placement 5/31 TPA/ pulmonzyme started for 3 days for persistent left effusion 6/01 Afebrile, low-dose Levophed, Precedex, Minimal output on chest tube- pulmozyme / tpa x 2,  6/02 Weaned x 7 hours yesterday PSV 15/5, pulmozyme and TPA completed 3 days 6/03 afib with RVR placed on amio gtt overnight , 1U PRBC for Hb 7.1  6/05 extubated successfully, Repeat TPA/Pulmonzyme given again  6/06 acute decompensation overnight, hemorrhagic shock >to OR for left VATS with 2L hemothorax. CT head with new ICH 6/09 More awake but agitated on PSV weaning  6/12 Seizures + on EEG  6/22 trach 6/27 More awake 6/29 g tube and tunneled vascular catheter placed. Hypoglycemic with TF off 7/05 Stable on vent 7/09 vomiting after meds, KUB pending. Much more alert.  7/18>> hemoptysis, respiratory distress, back on vent 02/08/23 CP arrest  Procedures: ETT 5/30 >>6/22  Trach 6/22  >> L pigtail chest tube 5/30 >> 6/6 LIJ HD cath 6/12 >> 6/29 PEG 6/29 >> Tunneled HD 6/29 >>  Significant Diagnostic Tests: Thoracentesis left chest 5/29 >> 8K WBC, 78% WBC, protein 4.0, LDH 182. 1.5L bloody fluid CT chest abdomen pelvis 6/6 >>large left hemithorax, patchy right lung opacities with small right effusion. No acute abdominal process CT head 6/6 >>evolution of right MCA infarct with hemorrhagic transformation with large intraparenchymal hematoma, 2 additional small hemorrhages in the inferior cerebellum and left temporal lobe. Trace subarachnoid blood. CT Head 6/10 >>stable hemorrhagic R MCA. Right hemi-craniectomy with no midline shift or mass effect. Mildly regressed small cerebellar hemorrhages. Stable small volume intraventricular hemorrhage, no ventriculomegaly. Trace subarachnoid hemorrhage largely resolved.  EEG 6/12 >>right frontocentral seizures X2  Bronchoscopy 5/30 >>large, thick, grey mucus plug completely occluding the left mainstem bronchus.   Micro Data: RVP 4/16 neg  MRSA PCR 4/17 neg   ======== Left pleural fluid culture 5/29 >> negative Left pleural fluid cytology 5/29 >>No malignant cells identified. Blood and acute inflammation.  BAL 5/30 >> rare candida albicans L Pleural Fluid 6/6 >> negative BCx2 6/6 >>negative  Tracheal aspirate 6/30 >>few MRSA  MRSA PCR 7/4 >>negative  BC 7/18>> Pending Trach aspirate 7/18>> Pending  Hospital Course: Pulmonary and critical care medicine was consulted on May 29 in the setting of tachypnea and an enlarging left-sided pleural effusion. Thoracentesis was performed which showed a hemothorax. A CT chest was then ordered showing obstruction of her left mainstem bronchus and a loculated left pleural effusion. On May 30 she was moved to the ICU for chest tube placement and bronchoscopy. Underwent intrapleural TPA but required VATS eventually, course further complicated  by ICH, seizures, sepsis and  AKI requiring CRRT.  She was also intubated and initiated on mechanical ventilation for hypoxemic respiratory failure on 5/30. As she was unable to be weaned, she underwent tracheostomy on 6/22. She was subsequently weaned from the vent to a trach collar and eventually transferred from the ICU.  However, on the early AM of 7/18 PCCM was again called to see pt r/t blood from trach, large clots when suctioning, respiratory distress with RR 40's, desat to 80's on ATC. On arrival, the pt was noted to be tachypneic with RR 43, sats 90% on 100% ATC, bloody secretions noted on sheets. This improved with bagging and the patient was transferred back to the ICU with a presumptive diagnosis of septic shock from a possible pulmonary source and initiated on Levophed and Abx of Vanc+ Meropenem.. At approx. 0830 the patient acutely became hypoxemic with sats in mid-low 80s, intermittently responsive to bagging and suctioning. Repeat PCXR showed no ptx but new infiltrate in RUL. Sats eventually returned to near 100% on FIO2 1.0 and PEEP 8. She later was requiring increasing levels of pressors to maintain an adequate BP. She was administered Decadron and a cosyntropin stim test was performed for presumptive adrenal insufficiency (N.B. subsequent lab reports showed a cortisol level 46.7 ug/dL with no response post-ACTH).Over the course of the afternoon her BP improved with MAPs in the 70s.    However, at 16:12 the patient developed a PEA rhythm with rates in the 20s and a code was called. Over the course of the CP arrest the patient received: a total epi x11, NaHCO3 x4 for acidemia per ABGs, NS 500cc, and DC shocks x2 for VT. The patient had intermittent regained a pulse and BP but subsequently lost these parameters despite high-quality CPR and medical therapy. As such, resuscitative efforts were terminated at 17:03 and the patient pronounced. The family was at the bedside for approximately the last half of the code.  Final  Diagnoses:  1) Hypoxemic respiratory failure 2) Hospital-acquired pneumonia with sepsis

## 2020-02-15 NOTE — Progress Notes (Signed)
Late Entry  At 0900 pt became hypoxic.   RRT and Dr Rennis Harding came to bedside.  RRT/Dr Crim adjusted vent setting and was able to increase o2 sats. Will continue to monitor.  Irven Baltimore, RN

## 2020-02-15 NOTE — Progress Notes (Addendum)
[place] Progress Note Patient Name: Krystal Little DOB: 1956/01/02 MRN: [id number]   Date of Service  2020/02/11  HPI/Events of Note  New fever 103, increased RR and MAP 55 all new on Camera eval. On Vent.  Antimicrobials this admission: Cefepime 4/24 > 4/25, 6/6>6/12 Zosyn 4/28 > 5/4; 5/30 > 6/6; restart 7/18>> Vanc 7/18 >>  Microbiology results:  5/30 BAL- norm flora 6/6 pleural Left- neg 6/6 bldx2 - neg 6/7 TA - rare candida albicans 6/30 TA - few staph aureus >>MRSA 7/4 MRSA PCR - neg  7/16 BCx: ngtd CxR from 1:20 AM stable air space densities. PM status. Discussed with bed side RN.  1. Sepsis: source: Lung/trachitis. Fungal possible, but less likely. Not on abx. decub grade 1 as per RN.   eICU Interventions  -notified NP-ICU ground team. - get Pro cal/LA/Blood cx. - start Vanc , Zosyn. De escalate if work up negative. Watch for any diarrhea/cl diff/fungal thrush. May need meropenum, diflucan? If Pro cal high.  - started levophed via PICC line. To keep MAP > 65.      Intervention Category Major Interventions: Sepsis - evaluation and management;Respiratory failure - evaluation and management Minor Interventions: Communication with other healthcare providers and/or family  Elmer Sow 02/11/20, 3:02 AM   3;13 For levophed has to be shifted to ICU.  Will try LR and albumin bolus once and see, still if MAP < 65 to call back to shift to ICU.

## 2020-02-15 NOTE — Progress Notes (Signed)
CRITICAL VALUE ALERT  Critical Value:  Lactic acid-2.5   Date & Time Notied:  18-Feb-2020 @ 3403  Provider Notified: Dr. Randell Patient  Orders Received/Actions taken: None at this time

## 2020-02-15 NOTE — Progress Notes (Signed)
NAME:  Krystal Little, MRN:  [id number], DOB:  [birth year], LOS: 67 ADMISSION DATE:  11/18/2019, CONSULTATION DATE:  12/13/2019 REFERRING MD:  Letta Pate, CHIEF COMPLAINT:  Chest congestion   Brief History   64y/o F with an extensive cardiac history was admitted to [facility] in April in the setting of a right MCA stroke, had a right interventional radiology guided thrombectomy, later had significant brain edema and underwent craniectomy, ultimately transferred to inpatient rehab. Pulmonary and critical care medicine was consulted on May 29 in the setting of tachypnea and an enlarging left-sided pleural effusion. Thoracentesis was performed which showed a hemothorax.  A CT chest was then ordered showing obstruction of her left mainstem bronchus and a loculated left pleural effusion.  On May 30 she was moved to the ICU for chest tube placement and bronchoscopy.  Underwent intrapleural TPA but required VATS eventually, course further complicated by ICH, seizures and AKI requiring CRRT.   Past Medical History  Status post mitral valve repair 2001 Status post cardiac pacemaker 2001 History of sick sinus syndrome Hypertension Diabetes mellitus type 2 Chronic atrial fibrillation Right MCA stroke 2021  Significant Hospital Events   4/16 IR thrombectomy R MCA stroke 4/25 PCCM sign off 5/06 transfer to [facility] Inpatient Rehab 5/29 PCCM consult left pleural effusion, thoracentesis 5/30 worsening tachypnea. Move to ICU for intubation to facilitate bronchoscopy and chest tube placement 5/31 TPA/ pulmonzyme started for 3 days for persistent left effusion 6/01 Afebrile, low-dose Levophed, Precedex, Minimal output on chest tube- pulmozyme / tpa x 2,  6/02 Weaned x 7 hours yesterday PSV 15/5, pulmozyme and TPA completed 3 days 6/03 afib with RVR placed on amio gtt overnight , 1U PRBC for Hb 7.1  6/05 extubated successfully, Repeat TPA/Pulmonzyme given again  6/06 acute decompensation overnight,  hemorrhagic shock > to OR for left VATS with 2L hemothorax.  CT head with new ICH 6/09 More awake but agitated on PSV weaning  6/12 Seizures + on EEG  6/22 trach 6/27 More awake 6/29 g tube and tunneled vascular catheter placed. Hypoglycemic with TF off 7/05 Stable on vent 7/09 vomiting after meds, KUB pending. Much more alert.    7/18>> hemoptysis, respiratory distress, back on vent   Consults:  PCCM  Procedures:  ETT 5/30 >> 6/22  Trach 6/22 >>  L pigtail chest tube 5/30 >> 6/6 LIJ HD cath 6/12 >> 6/29 PEG 6/29 >>  Tunneled HD 6/29 >>      Significant Diagnostic Tests:  Thoracentesis left chest 5/29 >> 8K WBC, 78% WBC, protein 4.0, LDH 182. 1.5L bloody fluid CT chest abdomen pelvis 6/6 >> large left hemithorax, patchy right lung opacities with small right effusion.  No acute abdominal process CT head 6/6 >> evolution of right MCA infarct with hemorrhagic transformation with large intraparenchymal hematoma, 2 additional small hemorrhages in the inferior cerebellum and left temporal lobe.  Trace subarachnoid blood. CT Head 6/10 >> stable hemorrhagic R MCA.  Right hemi-craniectomy with no midline shift or mass effect.  Mildly regressed small cerebellar hemorrhages.  Stable small volume intraventricular hemorrhage, no ventriculomegaly.  Trace subarachnoid hemorrhage largely resolved.  EEG 6/12 >> right frontocentral seizures X2  Bronchoscopy 5/30 >> large, thick, grey mucus plug completely occluding the left mainstem bronchus.   Micro Data:  RVP 4/16 neg  MRSA PCR 4/17 neg   ======== Left pleural fluid culture 5/29 >> negative Left pleural fluid cytology 5/29 >> No malignant cells identified. Blood and acute inflammation.  BAL 5/30 >>  rare candida albicans L Pleural Fluid 6/6 >> negative BCx2 6/6 >> negative  Tracheal aspirate 6/30 >> few MRSA  MRSA PCR 7/4 >> negative  BC 7/18>> Pending Trach aspirate 7/18>> Pending  Antimicrobials:  Cefepime 4/24 >> 4/25 Zosyn 4/28  >> 5/4 Zosyn 5/30 >> 6/5 Cefepime 6/6 >> 6/12 Vanc 7/18>> Meropenem 7/18>>   Interim history/subjective:  At approx. 0830 today the patient acutely became hypoxemic with sats in mid-low 80s, intermittently responsive to bagging and suctioning. Repeat PCXR showed no ptx but new infiltrate in RUL. Sats eventually returned to near 100% on FIO2 1.0 and PEEP 8.  Objective   Blood pressure (!) 123/39, pulse 80, temperature (!) 101.7 F (38.7 C), temperature source Axillary, resp. rate (!) 29, height _0  (1.626 m), weight 53 kg, SpO2 100 %.    Vent Mode: PRVC FiO2 (%):  [40 %-100 %] 100 % Set Rate:  [20 bmp-25 bmp] 25 bmp Vt Set:  [430 mL-500 mL] 500 mL PEEP:  [5 cmH20-8 cmH20] 8 cmH20 Plateau Pressure:  [14 cmH20-32 cmH20] 32 cmH20   Intake/Output Summary (Last 24 hours) at 2020/02/04 1031 Last data filed at Feb 04, 2020 0800 Gross per 24 hour  Intake 1135.24 ml  Output --  Net 1135.24 ml   Filed Weights   01/30/20 1130 01/31/20 0500 February 04, 2020 0537  Weight: 57.5 kg 54 kg 53 kg    Examination: General: Somewhat emaciated B F with agonal type respirations on vent HENT: R skull deformity; Trach in situ Lungs: Diminished BS bilat without audible  Crackles/wheezes Cardiovascular: Reg; no m/r/g Abdomen: Bone flap in R abdomen. Dec. BS; no guarding Extremities: No c/c/e Neuro: Unresponsive; no focal deficits  Assessment & Plan:  1) Hypoxemic Respiratory failure with possible sepsis Likely secondary to developing RUL HAP On vanc + meropenem; cultures pending 2) Sepsis On relatively high dose of Levophed Will give 500cc LR bolus to see if fluid responsive to dow- titrate Levophed. 3) Hypoglycemia Had POC glu of ~10 for which pt received D50W Will start D5-containing IV and check cosyntropin stim 4) AKI -continue HD per Nephrology -Trend BMP / urinary output -Replace electrolytes as indicated  Best practice:  Diet: TF Pain/Anxiety/Delirium protocol (if indicated): prn  fentanyl VAP protocol (if indicated): Yes DVT prophylaxis: heparin SQ GI prophylaxis: PPI Glucose control: Hypoglycemic. Check ACTH stim Mobility: BR Code Status: Full Family Communication: Husband updated Disposition: ICU  Labs   CBC: Recent Labs  Lab 01/28/20 0551 01/28/20 1402 01/29/20 0500 01/30/20 0506 01/31/20 0625 2020/02/04 0331 February 04, 2020 0929  WBC 21.7*  --  24.1* 23.0* 20.3* 29.0*  --   NEUTROABS 17.5*  --  18.1* 17.1* 16.0* 23.7*  --   HGB 8.5*   < > 8.5* 8.1* 8.3* 8.9* 10.5*  HCT 27.8*   < > 28.4* 27.1* 28.0* 30.6* 31.0*  MCV 95.5  --  95.9 94.8 96.6 97.5  --   PLT 224  --  241 248 234 247  --    < > = values in this interval not displayed.    Basic Metabolic Panel: Recent Labs  Lab 01/28/20 0551 01/28/20 0551 01/29/20 0500 01/30/20 0506 01/31/20 0625 February 04, 2020 0331 2020/02/04 0344 02/04/2020 0929  NA 129*   < > 132* 131* 135 136  --  141  K 4.3   < > 4.1 4.6 3.7 5.3*  --  6.4*  CL 92*  --  93* 91* 98 98  --   --   CO2 23  --  26 24  25 22  --   --   GLUCOSE 158*  --  133* 101* 174* 29*  --   --   BUN 85*  --  46* 78* 41* 73*  --   --   CREATININE 4.52*  --  3.04* 4.61* 2.91* 4.62*  --   --   CALCIUM 8.2*  --  8.4* 8.7* 8.5* 8.7*  --   --   MG  --   --   --   --   --   --  3.3*  --   PHOS 5.4*  --  4.1 7.5* 5.6* 9.5*  --   --    < > = values in this interval not displayed.   GFR: Estimated Creatinine Clearance: 10.2 mL/min (A) (by C-G formula based on SCr of 4.62 mg/dL (H)). Recent Labs  Lab 01/29/20 0500 01/30/20 0506 01/31/20 0625 02-23-2020 0331 2020-02-23 0622  PROCALCITON  --   --   --  12.53  --   WBC 24.1* 23.0* 20.3* 29.0*  --   LATICACIDVEN  --   --   --  2.5* 2.0*    Liver Function Tests: Recent Labs  Lab 01/28/20 0551 01/29/20 0500 01/30/20 0506 01/31/20 0625 23-Feb-2020 0331  ALBUMIN 1.3* 1.5* 1.5* 1.4* 1.4*   No results for input(s): LIPASE, AMYLASE in the last 168 hours. No results for input(s): AMMONIA in the last 168  hours.  ABG    Component Value Date/Time   PHART 7.248 (L) 02/23/2020 0929   PCO2ART 48.5 (H) 02/23/2020 0929   PO2ART 58 (L) 02-23-2020 0929   HCO3 21.2 02-23-2020 0929   TCO2 23 February 23, 2020 0929   ACIDBASEDEF 6.0 (H) 02/23/20 0929   O2SAT 85.0 2020/02/23 0929     Coagulation Profile: Recent Labs  Lab 01/29/20 0500  INR 1.4*    Cardiac Enzymes: No results for input(s): CKTOTAL, CKMB, CKMBINDEX, TROPONINI in the last 168 hours.  HbA1C: HbA1c, POC (controlled diabetic range)  Date/Time Value Ref Range Status  08/18/2019 10:45 AM 12.2 (A) 0.0 - 7.0 % Final  02/04/2018 04:20 PM 8.0 (A) 0.0 - 7.0 % Final   Hgb A1c MFr Bld  Date/Time Value Ref Range Status  11/01/2019 03:48 AM 9.7 (H) 4.8 - 5.6 % Final    Comment:    (NOTE) Pre diabetes:          5.7%-6.4% Diabetes:              >6.4% Glycemic control for   <7.0% adults with diabetes     CBG: Recent Labs  Lab 2020/02/23 0445 23-Feb-2020 0505 2020-02-23 0751 February 23, 2020 0853 2020-02-23 0927  GLUCAP 18* 172* <10* 81 203*    Past Medical History  She,  has a past medical history of Chronic atrial fibrillation ([place]) (1990s), Diabetes mellitus type 2, uncontrolled, without complications, Essential hypertension, History of cardiac catheterization (2001), sick sinus syndrome (07/1999), S/P MVR (mitral valve repair) (08/09/1999), and 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   Surgical History    Past Surgical History:  Procedure Laterality Date  . CARDIAC CATHETERIZATION  08/03/1999   [place] - normal Coronaries; no sign of constriction or restrictive cardiomypathy  . CRANIECTOMY FOR DEPRESSED SKULL FRACTURE Right 11/02/2019   Procedure: DECOMPRESSIVE HEMI -CRANIECTOMY, IMPLANTATION OF SKULL FLAP TO RIGHT ABDOMEN;  Surgeon: Consuella Lose, MD;  Location: [place];  Service: Neurosurgery;  Laterality: Right;  . IR CT HEAD LTD  10/31/2019  . IR FLUORO GUIDE CV LINE RIGHT  01/13/2020  .  IR GASTROSTOMY TUBE MOD SED  01/13/2020  . IR  PERCUTANEOUS ART THROMBECTOMY/INFUSION INTRACRANIAL INC DIAG ANGIO  10/31/2019      . IR PERCUTANEOUS ART THROMBECTOMY/INFUSION INTRACRANIAL INC DIAG ANGIO  10/31/2019  . IR US GUIDE VASC ACCESS RIGHT  01/13/2020  . MITRAL VALVE REPAIR  07/1999   Both Ant& Post leaflet repair - quadrangular resection&caudal transposition, #32 Sequin Annuloplasty ring  . PACEMAKER GENERATOR CHANGE  11/26/2008   Medtronic Adapta L(? if it has been checked since 02/2013)  . PACEMAKER INSERTION  07/1999   for SSS in setting of Chronic Afib; Medtronic Kappa [id number], [id number] H.  Marland Kitchen RADIOLOGY WITH ANESTHESIA N/A 10/31/2019   Procedure: IR WITH ANESTHESIA;  Surgeon: Radiologist, Medication, MD;  Location: [place];  Service: Radiology;  Laterality: N/A;  . TRANSTHORACIC ECHOCARDIOGRAM  05/2018   Normal LV size and thickness.  EF estimated 50%.  Inferobasal HK and flattened septum c/w with elevated PA pressures. Mild functional mitral stenosis at rest (postoperative).  Moderate left atrial dilation and mild right atrial dilation.  Mean PA pressures estimated 52 mmHg.  Marland Kitchen VIDEO ASSISTED THORACOSCOPY (VATS)/THOROCOTOMY Left 01/10/2020   Procedure: Video Assisted Thoracoscopy (Vats) left , Drainage of Hemothorax;  Surgeon: Ivin Poot, MD;  Location: [place];  Service: Thoracic;  Laterality: Left;  Marland Kitchen VIDEO BRONCHOSCOPY N/A 01/09/2020   Procedure: Video Bronchoscopy;  Surgeon: Ivin Poot, MD;  Location: [place];  Service: Thoracic;  Laterality: N/A;     Social History   reports that she has never smoked. She has never used smokeless tobacco. She reports current alcohol use. She reports that she does not use drugs.   Family History   Her family history includes Cancer in her brother; Diabetes in her sister; Hypertension in her mother; Lung cancer in her brother.   Allergies No Known Allergies   Home Medications  Prior to Admission medications   Medication Sig Start Date End Date Taking? Authorizing Provider  acetaminophen  (TYLENOL) 500 MG tablet Take 500-1,000 mg by mouth every 6 (six) hours as needed for headache (pain).    [provider]  amantadine (SYMMETREL) 50 MG/5ML solution Place 10 mLs (100 mg total) into feeding tube 2 (two) times daily. 11/20/19   Donzetta Starch, NP  Amino Acids-Protein Hydrolys (FEEDING SUPPLEMENT, PRO-STAT SUGAR FREE 64,) LIQD Take 30 mLs by mouth 2 (two) times daily. 11/20/19   Donzetta Starch, NP  aspirin 325 MG tablet Place 1 tablet (325 mg total) into feeding tube daily. 11/21/19   Donzetta Starch, NP  atorvastatin (LIPITOR) 40 MG tablet Place 1 tablet (40 mg total) into feeding tube daily. 11/21/19   Donzetta Starch, NP  Chlorhexidine Gluconate Cloth 2 % PADS Apply 6 each topically daily at 6 (six) AM. 11/21/19   Donzetta Starch, NP  chlorhexidine gluconate, MEDLINE KIT, (PERIDEX) 0.12 % solution 15 mLs by Mouth Rinse route 2 (two) times daily. 11/20/19   Donzetta Starch, NP  diltiazem (CARDIZEM) 10 mg/ml oral suspension Place 6 mLs (60 mg total) into feeding tube every 6 (six) hours. 11/20/19   Donzetta Starch, NP  enoxaparin (LOVENOX) 40 MG/0.4ML injection Inject 0.4 mLs (40 mg total) into the skin daily. 11/20/19   Donzetta Starch, NP  insulin aspart (NOVOLOG) 100 UNIT/ML injection Inject 0-9 Units into the skin 3 (three) times daily with meals. 11/20/19   Donzetta Starch, NP  insulin aspart (NOVOLOG) 100 UNIT/ML injection Inject 8 Units into the skin 2 (two)  times daily. 11/20/19   Donzetta Starch, NP  insulin glargine (LANTUS) 100 UNIT/ML injection Inject 0.2 mLs (20 Units total) into the skin 2 (two) times daily. 11/20/19   Donzetta Starch, NP  Maltodextrin-Xanthan Gum (RESOURCE THICKENUP CLEAR) POWD Take 120 g by mouth as needed (nectar thick liquids). 11/20/19   Donzetta Starch, NP  metoprolol tartrate (LOPRESSOR) 25 MG tablet Place 1 tablet (25 mg total) into feeding tube 2 (two) times daily. 11/20/19   Donzetta Starch, NP  Nutritional Supplements (FEEDING SUPPLEMENT, GLUCERNA 1.5 CAL,) LIQD Place 500  mLs into feeding tube daily. 11/20/19   Donzetta Starch, NP  polyethylene glycol (MIRALAX / GLYCOLAX) 17 g packet Place 17 g into feeding tube 2 (two) times daily. 11/20/19   Donzetta Starch, NP  senna-docusate (SENOKOT-S) 8.6-50 MG tablet Place 1 tablet into feeding tube at bedtime as needed for mild constipation. 11/20/19   Donzetta Starch, NP  senna-docusate (SENOKOT-S) 8.6-50 MG tablet Place 1 tablet into feeding tube 2 (two) times daily. 11/20/19   Donzetta Starch, NP     Critical care time: 50 min

## 2020-02-15 NOTE — Progress Notes (Signed)
Hypoglycemic Event  CBG: <10 at 0751  Treatment: D50 50 mL (25 gm) and D5 maintence fluid ordered   Symptoms: None  Follow-up CBG: Time: 0853 CBG Result:81  Possible Reasons for Event: Inadequate meal intake  Comments/MD notified:Dr. Crim notified and orders placed.      Krystal Little

## 2020-02-15 NOTE — Progress Notes (Signed)
I responded to pt code and when I arrived the resus team was working on Ms. Bogie.  Her husband and daughter arrived and they were distraught.  Her husband tried to coax her into pulling through and had a very difficult time throughout the process of CPR.  Once she was pronounced her husband and later her nephew (whom he said raised her since he was 2) were pretty loud and aggressive as they tried, in their fresh grief and minds, to ascertain what happened. Her husband said when he left she had just shaken his hand. Her daughter said she had just left to get something to eat and maybe she should not have left.  I supported them through their very difficult grief. They wailed in their grief.  Her husband walked back and forth and broke down crying when he called his brother to tell him.  I had prayer bedside with family before leaving the unit.  When I returned, they were still visiting and their pastor and his wife arrived. I stayed and supported the family as their pastor ministered to their grief and had prayer.  The family was accepting, but still broken when I left the unit again. [place], MDiv   01/06/20 1500  Clinical Encounter Type  Visited With Patient and family together

## 2020-02-15 NOTE — Progress Notes (Signed)
Brief nephrology f/u note PM labs with K 6.2.  On NE for BP support. Will start CRRT, trend K.  Net even UF. No circuit heparin to start

## 2020-02-15 NOTE — Progress Notes (Signed)
Hypoglycemic Event  CBG: 18  Treatment: D50 50 mL (25 gm)  Symptoms: None  Follow-up CBG: Time:0505 CBG Result:172  Possible Reasons for Event: Inadequate meal intake  Comments/MD notified:Dr. Renee Pain, Carren Rang

## 2020-02-15 NOTE — Code Documentation (Signed)
Cardiac Arrest Code/Death Note  Brief Hx: 64y/o F with an extensive cardiac history was admitted to [facility] in April in the setting of a right MCA stroke, had a right interventional radiology guided thrombectomy, later had significant brain edema and underwent craniectomy, ultimately transferred to inpatient rehab. Pulmonary and critical care medicine was consulted on May 29 in the setting of tachypnea and an enlarging left-sided pleural effusion. Thoracentesis was performed which showed a hemothorax. On May 30 she was moved to the ICU for chest tube placement and bronchoscopy. Underwent intrapleural TPA but required VATS eventually, course further complicated by ICH, seizures and AKI requiring CRRT. Hospital course further complicated by ICH, seizures, AKI now on HD. Had trach/PEG placement. PCCM was reconsulted in the early AM of 7/19 for tachypnea, hemoptysis and acute on chronic hypoxic respiratory failure and the patient was transerred to the ICU with a presumptive diagnosis of septic shock from a possible pulmonary source and initiated on Levophed and Abx of Vanc+ Meropenem.  At approx. 0830 on 7/18, the patient acutely became hypoxemic with sats in mid-low 80s, intermittently responsive to bagging and suctioning. Repeat PCXR showed no ptx but new infiltrate in RUL. Sats eventually returned to near 100% on FIO2 1.0 and PEEP 8. Due to rising Levophed requirements (MAPs in th low 50s) and acidemia, the patient was empirically given Decadron 2 mg IV and a Cortrosyn stim test conducted for presumptive adrenal insufficiency. Over the course of the afternoon her BP improved with MAPs in the 70s.   However, at 16:12 the patient developed a PEA rhythm with rates in the 20s and a code was called. Over the course of the CP arrest the patient received: a total epi x11, NaHCO3 x4 for acidemia per ABGs, NS 500cc, and DC shocks x2 for VT. The patient had intermittent regained a pulse and BP but  subsequently lost these parameters despite high-quality CPR and medical therapy. As such, resuscitative efforts were terminated at 17:03 and the patient pronounced. The family was at the bedside for approximately the last half of the code.  Jamesetta So, M.D.

## 2020-02-15 NOTE — Progress Notes (Signed)
RT, and 2 RNs transported pt from 4NP3 to 3W14 on vent w/out complication. Pt respiratory status stable throughout transport. RT will continue to monitor.

## 2020-02-15 NOTE — Progress Notes (Signed)
Pt developed a brady rhythm 20HR and code was  Initiated at 1612.  Compressions immediatly started and defibrillator pads attached.  Dr. Rennis Harding, Rapid Response, RRT, Pharmacy, and other RNs arrived to code. Despite achieving ROSC 4 times throughout code pulse could not be maintained.  Stopped Code at 1703 per Dr. Flint Melter orders.  Irven Baltimore, RN

## 2020-02-15 NOTE — Progress Notes (Signed)
  Pharmacy Antibiotic Note  Krystal Little is a 64 y.o. female admitted on 12/13/2019 with code stroke - complicated hospital stay. Pharmacy has been consulted for Vancomycin and Zosyn dosing for sepsis. She is noted with ESRD and to start CRRT and will need changes to antibiotic doseing   Plan: Change meropenem to 1gm IV q8h Change vancomycin to 500mg  IV q24h Will f/u HD schedule and tolerance, micro data, and pt's clinical condition Vanc levels prn    Height: 5\' 4"  (162.6 cm) Weight: 53 kg (116 lb 13.5 oz) (with prevalon boots) IBW/kg (Calculated) : 54.7  Temp (24hrs), Avg:101.5 F (38.6 C), Min:100.7 F (38.2 C), Max:103.1 F (39.5 C)  Recent Labs  Lab 01/28/20 0551 01/28/20 0551 01/29/20 0500 01/30/20 0506 01/31/20 0625 Mar 01, 2020 0331 03/01/20 0622 03-01-2020 1452  WBC 21.7*  --  24.1* 23.0* 20.3* 29.0*  --   --   CREATININE 4.52*   < > 3.04* 4.61* 2.91* 4.62*  --  4.93*  LATICACIDVEN  --   --   --   --   --  2.5* 2.0*  --    < > = values in this interval not displayed.    Estimated Creatinine Clearance: 9.5 mL/min (A) (by C-G formula based on SCr of 4.93 mg/dL (H)).    No Known Allergies  Hildred Laser, PharmD Clinical Pharmacist **Pharmacist phone directory can now be found on [email or web address] ([email or web address]).  Listed under [place].

## 2020-02-15 NOTE — Progress Notes (Signed)
RT called to pts room for desaturation. RT arrived pt sats were 85% pt tachypneic in the 40's. RN placed pt on 10l 98% on ATC. RT suctioned pt with small amount of pink tinged mucus on return. RT then lavaged and suctioned pt producing a nickel sized clot with some sunflower seed sized smaller ones as well. Pt sats still not improved so RT bag lavaged pt and suctioned returning more smaller clots and pink tinged mucus. Pt sats improved and are currently stable at 91%. RN advised to call MD and notify them of pts increase in O2 need and clots. RT will continue to monitor.

## 2020-02-15 NOTE — Progress Notes (Signed)
Pharmacy Antibiotic Note  Krystal Little is a 64 y.o. female admitted on 11/22/2019 with code stroke - complicated hospital stay. Pharmacy has been consulted for Vancomycin and Zosyn dosing for sepsis.  Currently on HD on M/W/F schedule  Plan: Zosyn 2.25gm IV q8h Vancomycin 1000 mg IV now then 500 mg qHD. Will f/u HD schedule and tolerance, micro data, and pt's clinical condition Vanc levels prn   Height: _0  (162.6 cm) Weight: 54 kg (119 lb 0.8 oz) IBW/kg (Calculated) : 54.7  Temp (24hrs), Avg:99.3 F (37.4 C), Min:98.3 F (36.8 C), Max:100.8 F (38.2 C)  Recent Labs  Lab 01/27/20 0450 01/28/20 0551 01/29/20 0500 01/30/20 0506 01/31/20 0625  WBC 17.6* 21.7* 24.1* 23.0* 20.3*  CREATININE 3.28* 4.52* 3.04* 4.61* 2.91*    Estimated Creatinine Clearance: 16.4 mL/min (A) (by C-G formula based on SCr of 2.91 mg/dL (H)).    No Known Allergies  Antimicrobials this admission: Cefepime 4/24 > 4/25, 6/6>6/12 Zosyn 4/28 > 5/4; 5/30 > 6/6; restart 7/18>> Vanc 7/18 >>  Microbiology results:  5/30 BAL- norm flora 6/6 pleural Left- neg 6/6 bldx2 - neg 6/7 TA - rare candida albicans 6/30 TA - few staph aureus >> MRSA 7/4 MRSA PCR - neg  7/16 BCx: ngtd  Thank you for allowing pharmacy to be a part of this patient's care.  Sherlon Handing, PharmD, BCPS Please see amion for complete clinical pharmacist phone list 02/13/20 3:00 AM

## 2020-02-15 NOTE — Progress Notes (Signed)
NAME:  Krystal Little, MRN:  [id number], DOB:  10-30-1955, LOS: 7 ADMISSION DATE:  11/26/2019, CONSULTATION DATE:  5/29 REFERRING MD:  Letta Pate, CHIEF COMPLAINT:  Chest congestion   Brief History   64 y/o F with an extensive cardiac history was admitted to [facility] in April in the setting of a right MCA stroke, had a right interventional radiology guided thrombectomy, later had significant brain edema and underwent craniectomy, ultimately transferred to inpatient rehab.  Pulmonary and critical care medicine was consulted on May 29 in the setting of tachypnea and an enlarging left-sided pleural effusion. Thoracentesis was performed which showed a hemothorax.  A CT chest was then ordered showing obstruction of her left mainstem bronchus and a loculated left pleural effusion.  On May 30 she was moved to the ICU for chest tube placement and bronchoscopy.  Underwent intrapleural TPA but required VATS eventually, course further complicated by ICH, seizures and AKI requiring CRRT.   Past Medical History  Status post mitral valve repair 2001 Status post cardiac pacemaker 2001 History of sick sinus syndrome Hypertension Diabetes mellitus type 2 Chronic atrial fibrillation Right MCA stroke 2021  Significant Hospital Events   4/16 IR thrombectomy R MCA stroke 4/25 PCCM sign off 5/06 transfer to [facility] 5/29 PCCM consult left pleural effusion, thoracentesis 5/30 worsening tachypnea. Move to ICU for intubation to facilitate bronchoscopy and chest tube placement 5/31 TPA/ pulmonzyme started for 3 days for persistent left effusion 6/01 Afebrile, low-dose Levophed, Precedex, Minimal output on chest tube- pulmozyme / tpa x 2,  6/02 Weaned x 7 hours yesterday PSV 15/5, pulmozyme and TPA completed 3 days 6/03 afib with RVR placed on amio gtt overnight , 1U PRBC for Hb 7.1  6/05 extubated successfully, Repeat TPA/Pulmonzyme given again  6/06 acute decompensation overnight,  hemorrhagic shock > to OR for left VATS with 2L hemothorax.  CT head with new ICH 6/09 More awake but agitated on PSV weaning  6/12 Seizures + on EEG  6/22 trach 6/27 More awake 6/29 g tube and tunneled vascular catheter placed. Hypoglycemic with TF off 7/05 Stable on vent 7/09 vomiting after meds, KUB pending. Much more alert.    7/18>> hemoptysis, respiratory distress, back on vent   Consults:  PCCM  Procedures:  ETT 5/30 >> 6/22  Trach 6/22 >>  L pigtail chest tube 5/30 >> 6/6 LIJ HD cath 6/12 >> 6/29 PEG 6/29 >>  Tunneled HD 6/29 >>      Significant Diagnostic Tests:  Thoracentesis left chest 5/29 >> 8K WBC, 78% WBC, protein 4.0, LDH 182. 1.5L bloody fluid CT chest abdomen pelvis 6/6 >> large left hemithorax, patchy right lung opacities with small right effusion.  No acute abdominal process CT head 6/6 >> evolution of right MCA infarct with hemorrhagic transformation with large intraparenchymal hematoma, 2 additional small hemorrhages in the inferior cerebellum and left temporal lobe.  Trace subarachnoid blood. CT Head 6/10 >> stable hemorrhagic R MCA.  Right hemi-craniectomy with no midline shift or mass effect.  Mildly regressed small cerebellar hemorrhages.  Stable small volume intraventricular hemorrhage, no ventriculomegaly.  Trace subarachnoid hemorrhage largely resolved.  EEG 6/12 >> right frontocentral seizures X2  Bronchoscopy 5/30 >>  large, thick, grey mucus plug completely occluding the left mainstem bronchus.   Micro Data:  RVP 4/16 neg  MRSA PCR  4/17 neg   ======== Left pleural fluid culture 5/29 >> negative Left pleural fluid cytology 5/29 >> No malignant cells identified. Blood and acute  inflammation.  BAL 5/30 >> rare candida albicans L Pleural Fluid 6/6 >> negative BCx2 6/6 >> negative  Tracheal aspirate 6/30 >> few MRSA  MRSA PCR 7/4 >> negative   Antimicrobials:  Cefepime 4/24 >> 4/25 Zosyn 4/28 >> 5/4 Zosyn 5/30 >> 6/5 Cefepime 6/6 >>  6/12  Interim history/subjective:  Called to see pt r/t blood from trach, large clots when suctioning, respiratory distress with RR 40's, desat to 80's on ATC.  On my arrival pt tachypneic with RR 43, sats 90% on 100% ATC, bloody secretions noted on sheets. Improved with bagging. Called RT for vent.   Objective   Blood pressure (!) 167/63, pulse 84, temperature (!) 100.7 F (38.2 C), temperature source Oral, resp. rate (!) 39, height _0  (1.626 m), weight 54 kg, SpO2 91 %.    FiO2 (%):  [40 %-98 %] 98 %   Intake/Output Summary (Last 24 hours) at 02-11-2020 0133 Last data filed at 01/31/2020 1800 Gross per 24 hour  Intake 951 ml  Output --  Net 951 ml   Filed Weights   01/30/20 0500 01/30/20 1130 01/31/20 0500  Weight: 57.3 kg 57.5 kg 54 kg    Examination:  General: chronically ill appearing, mild resp distress  HEENT: MM pink/moist, trach midline, bloody secretions, clots noted  Neuro: eyes open, does not follow commands  CV: s1s2 rrr, no m/r/g PULM: resps shallow, tachypneic RR 40's, diminished bases, bloody secretions noted as above  GI: soft, bsx4 active, PEG in place Extremities: warm/dry, no edema  Skin: no rashes or lesions  Assessment & Plan:   Acute on chronic Respiratory Failure with Hypoxemia s/p Tracheostomy  Mucus Plugging, Suspected Aspiration   Hemoptysis  Left Loculated Effusion complicated by Hemothorax s/p VATS Decortication PLAN -  Rest on vent overnight  F/u CXR now  ATC trial in am  Minimize suctioning to avoid trauma  Trend CBC  Needs LTAC v ?vent SNF    AKI secondary to Shock No evidence of obstruction on renal US. S/P tunneled HD cath 6/29 -continue HD per Nephrology -Trend BMP / urinary output -Replace electrolytes as indicated  S/P R MCA Stroke  Residual left sided paralysis - s/p right hemicraniotomy, complicated by ICH after intrapleural tPA.   -follow serial neuro exams -no surgical interventions  -will eventually need flap  replaced   Status Epilepticus Suspect in setting of recent CVA -continue keppra, dilantin per Neuro  -PRN ativan for seizure   DM2 with Hyperglycemia Has had intermittent hypoglycemia with interruption of TF -per primary   Chronic Atrial Fibrillation s/p Pacemaker -per primary   Moderate Protein Calorie Malnutrition  -continue TF per primary   Goals of Care -full code  -will need placement > LTAC vs SNF.  Will depend on if she can be liberated from vent.  May be complicated placement process due to HD & trach needs currently.   Best practice:  Diet: TF Pain/Anxiety/Delirium protocol (if indicated): PRN fentanyl  VAP protocol (if indicated): yes DVT prophylaxis: Heparin SQ GI prophylaxis: PPI Glucose control: per primary  Mobility: BR Code Status: full code Family Communication: per primary team  Disposition: LTAC vs SNF    Nickolas Madrid, NP Pulmonary/Critical Care Medicine  02-11-20  1:33 AM

## 2020-02-15 NOTE — Significant Event (Addendum)
Rapid Response Event Note  Overview: Called d/t SpO2-82% on .40 TC and increased WOB.   Pt was seen earlier tonight during rounds(around 2045). At this time pt was tachypneic(upper 30s) and labored, SpO2-96% on .40 TC. After suctioning pt, pt looked more comfortable and SpO2 remained stable at this time.   Initial Focused Assessment: Pt laying in bed in resp distress, +WOB, +accessory muscle use. Lungs with rhonchi t/o. Skin warm and dry. Pt has a #6 Shiley cuffed trach-site looks WNL. Neuro exam unchanged from previous assessments. T-100.7, HR-84, BP-167/63, RR-45, SpO2-82% on .40 TC.  Interventions: FiO2 increased from .40 TC to .98 TC-SpO2 increased to 91% Pt bagged and lavaged-large amount bloody clots suctioned out. Plan of Care (if not transferred): Pt remains tachypneic with accessory muscle use after suctioning. She is also still requiring .98 TC to maintain SpO2-91%. PCCM consulted by [facility] NP and coming to see pt.   PCCM to bedside: place pt on vent support tonight and leave on 4NP at this time. Will attempt TC again in AM. If unable to transition back to TC in AM, will move to ICU.   0305-T-103, BP soft, pt being worked up for sepsis, levo ordered. Instead of moving pt to ICU, MD would like to try albumin and LR bolus to see how BP responds prior to starting levo and moving to ICU. Labs ordered and drawn and LR/Albumin started.   0400-Pt agonal breathing on vent. Elink asked to camera in. PCCM ground crew to come back and see pt. Will tx to ICU.   Event Summary:  Kennon Holter, NP notified at [place]: [id number] Arrived: 0004  Dillard Essex

## 2020-02-15 NOTE — Progress Notes (Signed)
Pt's Sp02 was in the 70's, increased Fi02 to 100% with no improvement. Began to manually ventilate the patient with an ambu-bag. Sp02 will temporarily improve and decrease again. Dr. Rennis Harding at bedside. ABG obtained to verify Sp02. Vt increased to 500 rate to 25 Fi02 to 100%.

## 2020-02-15 NOTE — Progress Notes (Signed)
[place] Progress Note Patient Name: Krystal Little DOB: 11-19-1955 MRN: [id number]   Date of Service  February 17, 2020  HPI/Events of Note  Re consultation for trach blocking from dark red secretions, hypoxemia. Was supposed to go to [facility]. Last seen by PCCM on 24 th.  64 yr old female 1. Trach/Peg/HD cath from 6/22 for AHRF. S/p VATS for hemothorax 6/6, CVA, SDH craniotomy 12/17/19.  Camera: sats 92%. HR 80, 160/63. Discussed with bed side RN. RT did lavage and suctioned some blodd dark clots from trach. RR 20's.  Data: Reviewed.wbc stable at 20K. Hg 8.3, stable.  Blood cx from 16 th NGTD.   A/P: 1. Recurring blood clots/secretion from trach. Could be trachitis. No fever. Not on abx. - discussed with bed side ICU ground team NP. For assessment for Ventilator.  2. Nephrology on case. HD On . Sz medicine /craniotomy.   eICU Interventions  - as above - on VAP bundle - get/consider pro cal. If elevated re abx. - vent via trach - on VTE /sup prophylaxis - BG goals < 180. On ssi.        Intervention Category Major Interventions: Respiratory failure - evaluation and management Minor Interventions: Communication with other healthcare providers and/or family Evaluation Type: Other  Elmer Sow 02/17/20, 12:32 AM

## 2020-02-15 NOTE — Progress Notes (Signed)
Patient ID: Anthea B Capshaw, female   DOB: 01/02/1956, 64 y.o.   MRN: [id number] S:    Decompensated overnight, hypoxic, hypotensive back to ICU, on vent, NE  Likely aspirated  K 5.3 this AM  O:BP (!) 123/39   Pulse 80   Temp (!) 101.7 F (38.7 C) (Axillary)   Resp (!) 29   Ht 5' 4" (1.626 m)   Wt 53 kg Comment: with prevalon boots  SpO2 100%   BMI 20.06 kg/m   Intake/Output Summary (Last 24 hours) at 01/22/2020 1100 Last data filed at 01/22/2020 0800 Gross per 24 hour  Intake 1135.24 ml  Output --  Net 1135.24 ml   Intake/Output: I/O last 3 completed shifts: In: 1626.1 [I.V.:24.4; Other:50; NG/GT:951; IV Piggyback:600.8] Out: -   Intake/Output this shift:  Total I/O In: 20.1 [I.V.:20.1] Out: -  Weight change: -4.5 kg Gen: NAD R IJ TDC present CVS: RRR, no rub Resp: on vent, coarse bs Abd: benign Ext: trace edema.  Recent Labs  Lab 01/26/20 0303 01/26/20 0303 01/27/20 0450 01/28/20 0551 01/29/20 0500 01/30/20 0506 01/31/20 0625 01/29/2020 0331 01/29/2020 0929  NA 128*   < > 131* 129* 132* 131* 135 136 141  K 4.7   < > 4.0 4.3 4.1 4.6 3.7 5.3* 6.4*  CL 91*  --  95* 92* 93* 91* 98 98  --   CO2 22  --  24 23 26 24 25 22  --   GLUCOSE 110*  --  182* 158* 133* 101* 174* 29*  --   BUN 94*  --  50* 85* 46* 78* 41* 73*  --   CREATININE 4.88*  --  3.28* 4.52* 3.04* 4.61* 2.91* 4.62*  --   ALBUMIN 1.3*  --  1.3* 1.3* 1.5* 1.5* 1.4* 1.4*  --   CALCIUM 8.1*  --  7.8* 8.2* 8.4* 8.7* 8.5* 8.7*  --   PHOS 7.0*  --  4.3 5.4* 4.1 7.5* 5.6* 9.5*  --    < > = values in this interval not displayed.   Liver Function Tests: Recent Labs  Lab 01/30/20 0506 01/31/20 0625 02/11/2020 0331  ALBUMIN 1.5* 1.4* 1.4*   No results for input(s): LIPASE, AMYLASE in the last 168 hours. No results for input(s): AMMONIA in the last 168 hours. CBC: Recent Labs  Lab 01/28/20 0551 01/28/20 1402 01/29/20 0500 01/29/20 0500 01/30/20 0506 01/30/20 0506 01/31/20 0625 02/13/2020 0331  02/10/2020 0929  WBC 21.7*  --  24.1*   < > 23.0*  --  20.3* 29.0*  --   NEUTROABS 17.5*  --  18.1*   < > 17.1*  --  16.0* 23.7*  --   HGB 8.5*   < > 8.5*   < > 8.1*   < > 8.3* 8.9* 10.5*  HCT 27.8*   < > 28.4*   < > 27.1*   < > 28.0* 30.6* 31.0*  MCV 95.5  --  95.9  --  94.8  --  96.6 97.5  --   PLT 224  --  241   < > 248  --  234 247  --    < > = values in this interval not displayed.   Cardiac Enzymes: No results for input(s): CKTOTAL, CKMB, CKMBINDEX, TROPONINI in the last 168 hours. CBG: Recent Labs  Lab 01/24/2020 0445 02/12/2020 0505 02/06/2020 0751 02/11/2020 0853 02/10/2020 0927  GLUCAP 18* 172* <10* 81 203*    Iron Studies: No results for input(s): IRON, TIBC, TRANSFERRIN,   FERRITIN in the last 72 hours. Studies/Results: DG Chest 1 View  Result Date: 02/22/20 CLINICAL DATA:  Desaturation with respiratory abnormalities. EXAM: CHEST  1 VIEW COMPARISON:  February 22, 2020 FINDINGS: Tracheostomy remains in good position. NG tube in the stomach. Right subclavian central venous catheter in the right atrium unchanged. Left arm PICC tip in the right atrium. Left thoracotomy changes with staples in the skin.  No pneumothorax Progression of right upper lobe airspace disease. Left lower lobe airspace disease and left effusion also slightly progressive. IMPRESSION: Progression of right upper lobe and left lower lobe airspace disease. Progression of left small pleural effusion. No pneumothorax. Electronically Signed   By: Franchot Gallo M.D.   On: February 22, 2020 10:06   DG Chest Port 1 View  Result Date: February 22, 2020 CLINICAL DATA:  64 year female with shortness of breath and hemoptysis. EXAM: PORTABLE CHEST 1 VIEW COMPARISON:  Chest radiograph dated 01/30/2020 FINDINGS: Tracheostomy above the carina. Dialysis catheter and left-sided PICC in similar position. There is mild cardiomegaly. Right pectoral pacemaker device. Small left pleural effusion and areas of opacity in the left lung relatively similar  to prior radiograph. The right lung remains clear. No pneumothorax. No acute osseous pathology. IMPRESSION: No significant interval change in the appearance of the lungs since the prior radiograph. Electronically Signed   By: Anner Crete M.D.   On: 02-22-20 01:56   DG Abd Portable 2V  Result Date: 02-22-20 CLINICAL DATA:  Emesis. EXAM: PORTABLE ABDOMEN - 2 VIEW COMPARISON:  01/30/2020 FINDINGS: Large amount of stool in the colon has progressed significantly since the prior study. Colonic distention compatible with ileus. No bowel obstruction identified. Gastrostomy tube overlying the stomach in unchanged position. IMPRESSION: Large amount of stool in the colon has progressed. Progressive colonic ileus. Electronically Signed   By: Franchot Gallo M.D.   On: 2020-02-22 06:56   . acetaminophen  500 mg Per Tube Once  . amantadine  100 mg Per Tube BID  . amiodarone  200 mg Per Tube Daily  . aspirin  325 mg Per Tube Daily  . atorvastatin  40 mg Per Tube Daily  . chlorhexidine gluconate (MEDLINE KIT)  15 mL Mouth Rinse BID  . Chlorhexidine Gluconate Cloth  6 each Topical Q0600  . chlorpheniramine-HYDROcodone  10 mg of hydrocodone Oral Q12H  . darbepoetin (ARANESP) injection - DIALYSIS  100 mcg Intravenous Q Fri-HD  . feeding supplement (PROSource TF)  45 mL Per Tube BID  . Gerhardt's butt cream   Topical TID  . guaiFENesin  15 mL Per Tube Q4H  . insulin aspart  0-20 Units Subcutaneous Q4H  . levETIRAcetam  750 mg Per Tube Q24H  . mouth rinse  15 mL Mouth Rinse 10 times per day  . multivitamin  1 tablet Per Tube QHS  . pantoprazole sodium  40 mg Per Tube QHS  . phenytoin  100 mg Per Tube Q8H  . sodium chloride flush  10-40 mL Intracatheter Q12H  . [START ON 02/02/2020] vancomycin  500 mg Intravenous Q M,W,F-HD    BMET    Component Value Date/Time   NA 141 2020/02/22 0929   NA 140 08/18/2019 1120   K 6.4 (HH) 02-22-2020 0929   CL 98 February 22, 2020 0331   CO2 22 2020-02-22 0331   GLUCOSE  29 (LL) 2020/02/22 0331   BUN 73 (H) Feb 22, 2020 0331   BUN 12 08/18/2019 1120   CREATININE 4.62 (H) 2020/02/22 0331   CREATININE 0.73 05/18/2016 0919   CALCIUM 8.7 (L) 02/22/2020  0331   GFRNONAA 9 (L) 01/16/2020 0331   GFRNONAA 89 05/18/2016 0919   GFRAA 11 (L) 01/21/2020 0331   GFRAA >89 05/18/2016 0919   CBC    Component Value Date/Time   WBC 29.0 (H) 02/12/2020 0331   RBC 3.14 (L) 01/23/2020 0331   HGB 10.5 (L) 01/28/2020 0929   HGB 15.2 08/18/2019 1120   HCT 31.0 (L) 01/29/2020 0929   HCT 45.9 08/18/2019 1120   PLT 247 02/04/2020 0331   PLT 168 08/18/2019 1120   MCV 97.5 01/26/2020 0331   MCV 94 08/18/2019 1120   MCH 28.3 01/15/2020 0331   MCHC 29.1 (L) 02/14/2020 0331   RDW 20.0 (H) 01/18/2020 0331   RDW 12.2 08/18/2019 1120   LYMPHSABS 1.9 02/07/2020 0331   MONOABS 2.1 (H) 01/30/2020 0331   EOSABS 0.2 02/06/2020 0331   BASOSABS 0.1 01/23/2020 0331    Assessment/Plan:  1. Dialysis dependent AKI- due to ischemic ATN in setting of hemorrhagic shock requiring pressor support and initiated on CRRT 6/11-6/20/21 and transitioned to IHD on 6/21 and has remained oliguric and dialysis dependent. 1. Given acute change might req return to CRRT, rpt BMP this PM to check on K 2. Reassess in AM for HD or CRRT 2. Left sided hemothorax- s/p VATS on 6/6 and s/p removal of chest tubes 3. RMCA stroke- s/p thrombectomy 4/16 c/b worsening cerebral edema with mass effect s/p decompressive right hemicraniectomy and placement of bone flap on 11/02/19, new ICH 01/05/2020. 4. VDRF- s/p trach 6/22/2, back on vent 7/17, likely aspiration, per CCM 5. Chronic atrial fibrillation- on amio and asa 6. HTN- stable; BPs quite variable 7. Seizure disorder- per neuro, Keppra and phenytoin 8. Nutrition- s/p PEG by IR on 6/29 9. ABLA/Anemia of critical illness- s/p multiple transfusions, On ESA qFriday.  10. Hyponatremia - mild, stable; limit free water 11. Disposition- will need LTAC  Ryan B Sanford   [facility]  

## 2020-02-15 NOTE — Progress Notes (Signed)
Patient is back on full vent support, and septic shock, on pressors, discussed with PCCM attending Dr. Rennis Harding, patient will be transferred to [facility] care as primary. Phillips Climes MD

## 2020-02-15 NOTE — Progress Notes (Signed)
Addendum  Feb 14, 2020 4:25 AM Changing Zosyn to Merrem 500 mg IV q24h  Narda Bonds, PharmD, BCPS Clinical Pharmacist Phone: [phone]   ================================================   Pharmacy Antibiotic Note  Krystal Little is a 64 y.o. female admitted on 11/25/2019 with code stroke - complicated hospital stay. Pharmacy has been consulted for Vancomycin and Zosyn dosing for sepsis.  Currently on HD on M/W/F schedule  Plan: Zosyn 2.25gm IV q8h Vancomycin 1000 mg IV now then 500 mg qHD. Will f/u HD schedule and tolerance, micro data, and pt's clinical condition Vanc levels prn   Height: 5\' 4"  (162.6 cm) Weight: 54 kg (119 lb 0.8 oz) IBW/kg (Calculated) : 54.7  Temp (24hrs), Avg:99.8 F (37.7 C), Min:98.3 F (36.8 C), Max:103.1 F (39.5 C)  Recent Labs  Lab 01/27/20 0450 01/27/20 0450 01/28/20 0551 01/29/20 0500 01/30/20 0506 01/31/20 0625 02/14/20 0331  WBC 17.6*   < > 21.7* 24.1* 23.0* 20.3* 29.0*  CREATININE 3.28*  --  4.52* 3.04* 4.61* 2.91*  --    < > = values in this interval not displayed.    Estimated Creatinine Clearance: 16.4 mL/min (A) (by C-G formula based on SCr of 2.91 mg/dL (H)).    No Known Allergies  Narda Bonds, PharmD, BCPS Clinical Pharmacist Phone: [phone]

## 2020-02-15 NOTE — Progress Notes (Signed)
Patient oxygen saturations dropped to 82% on 40% trach collar with tachypnea.  Patient suctioned and bloody secretions with small clots returned.  Rapid response RN  And RRT notified and they came to the bedside see their notes for their details about the event.  Lovey Newcomer, NP notified via phone of the above new orders were given and critical care was consulted and came to the bedside to assess patient see note for further details.  Patient now on ventilator see RRT documentation for ventilator. Oxygen saturations are now 100% on the ventilator nursing staff to continue to monitor.    02-05-2020 0000  Assess: MEWS Score  BP (!) 167/63  Pulse Rate 84  ECG Heart Rate 85  Resp (!) 39  Level of Consciousness Alert  SpO2 92 %  Assess: MEWS Score  MEWS Temp 1  MEWS Systolic 0  MEWS Pulse 0  MEWS RR 3  MEWS LOC 0  MEWS Score 4  MEWS Score Color Red  Assess: if the MEWS score is Yellow or Red  Were vital signs taken at a resting state? Yes  Focused Assessment Documented focused assessment  Early Detection of Sepsis Score *See Row Information* High  MEWS guidelines implemented *See Row Information* No, previously yellow, continue vital signs every 4 hours  Escalate  MEWS: Escalate Red: discuss with charge nurse/RN and provider, consider discussing with RRT  Notify: Charge Nurse/RN  Name of Charge Nurse/RN Notified Judeth Porch RN  Date Charge Nurse/RN Notified 2020-02-05  Time Charge Nurse/RN Notified 0000  Notify: Provider  Provider Name/Title Lovey Newcomer  Date Provider Notified 02-05-20  Notification Type Call  Notification Reason Change in status  Response See new orders  Date of Provider Response 02/05/2020  Time of Provider Response 0000  Notify: Rapid Response  Name of Rapid Response RN Notified Mindy  Date Rapid Response Notified 02/05/20  Time Rapid Response Notified 0000  Document  Patient Outcome Not stable and remains on department  Progress note created (see row info) Yes

## 2020-02-15 DEATH — deceased

## 2020-02-24 ENCOUNTER — Other Ambulatory Visit: Payer: Self-pay

## 2020-02-24 NOTE — Patient Outreach (Signed)
[facility]) Care Management  02/24/2020  Krystal Little 1956-06-20 [id number]  First telephone outreach attempt to obtain mRs. Patient is deceased as 2020-02-21.  Ina Homes [facility] Management Assistant [phone]

## 2020-10-12 IMAGING — CT CT CHEST W/O CM
2 of 3 series · 15 of 36 positions shown, 18 images · non-contrast
Comparison: Radiograph yesterday, additional priors. Remote chest
CT 12/20/2009

CLINICAL DATA: Hemothorax unexplained.  On anticoagulation.

EXAM:
CT CHEST WITHOUT CONTRAST
TECHNIQUE: Multidetector CT imaging of the chest was performed following the
standard protocol without IV contrast.

[Series 3: chest w/o 2mm st · axial · non-contrast · 0.71mm/px · z∈[+1180,+1450]mm · 12 of 159 slices shown, 15 images]
[im 12/159  mediastinal]
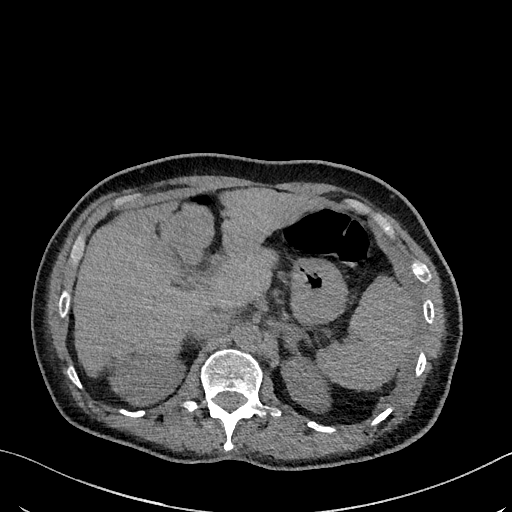
[im 12/159  lung]
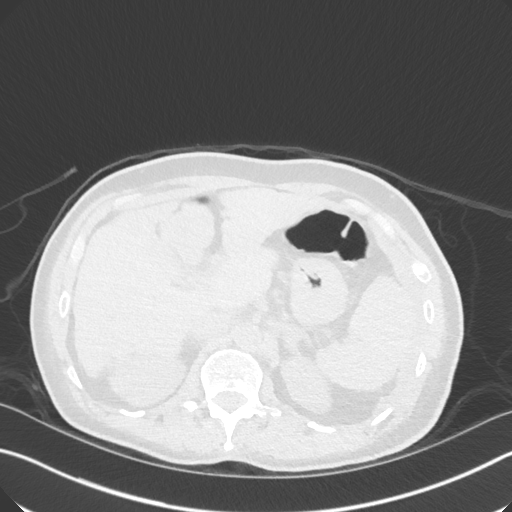
[im 24/159  lung]
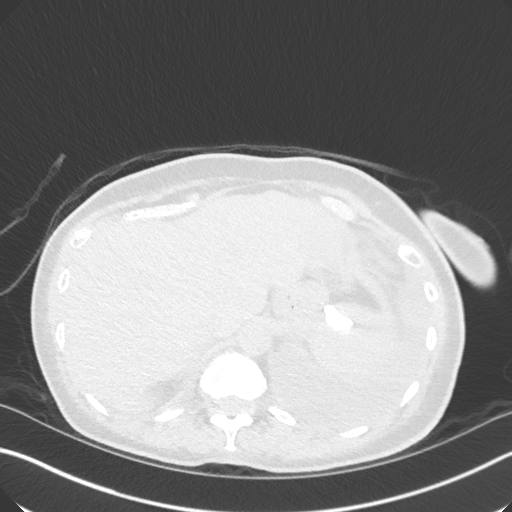
[im 36/159  lung]
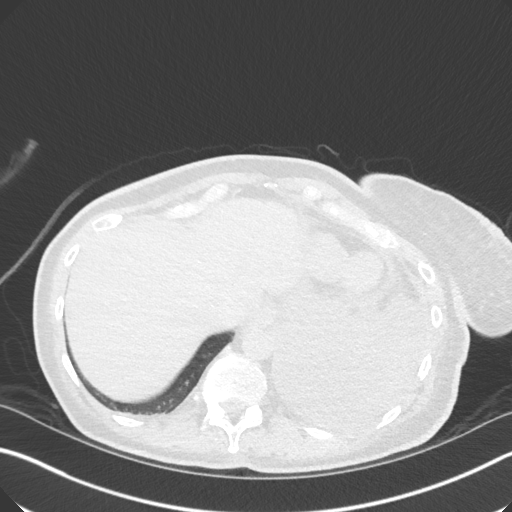
[im 47/159  lung]
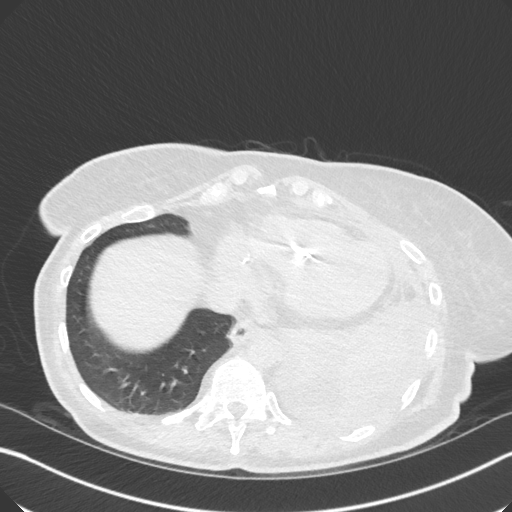
[im 59/159  mediastinal]
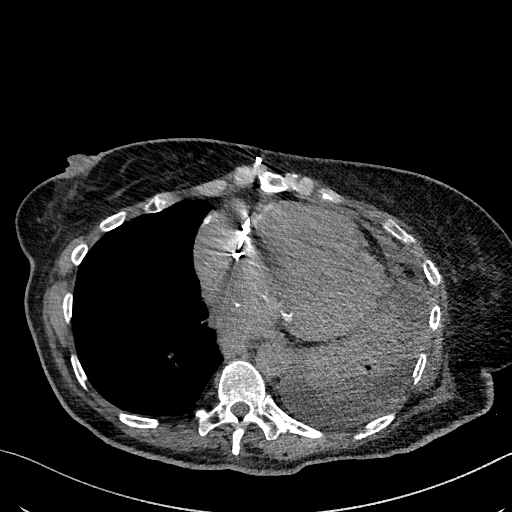
[im 59/159  lung]
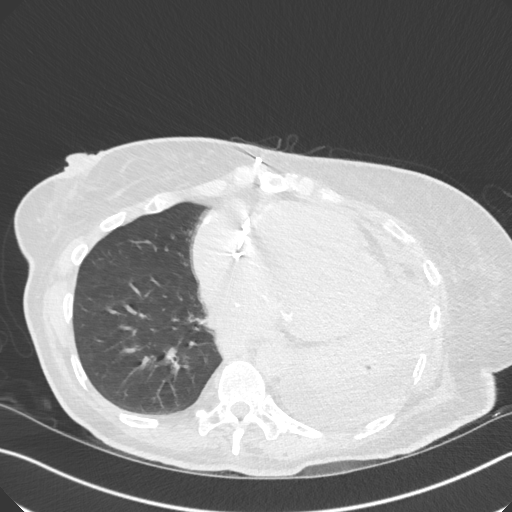
[im 71/159  lung]
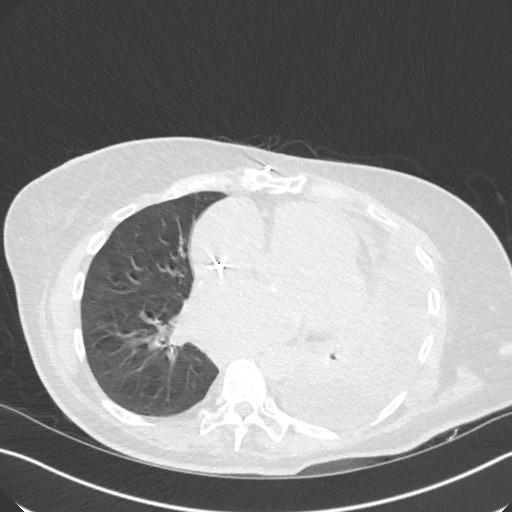
[im 88/159  lung]
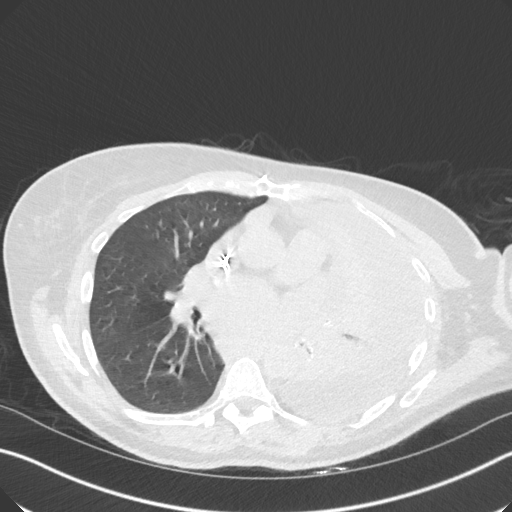
[im 100/159  lung]
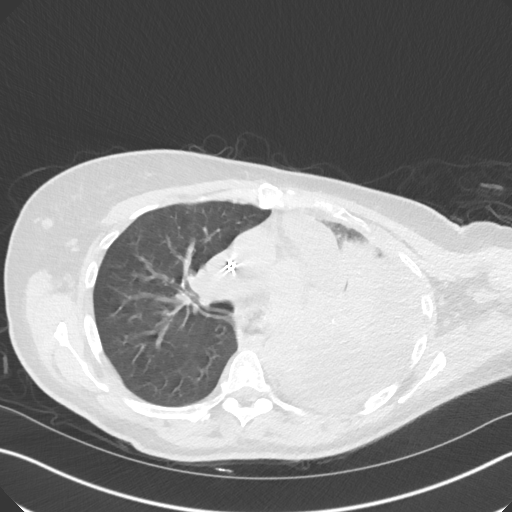
[im 112/159  mediastinal]
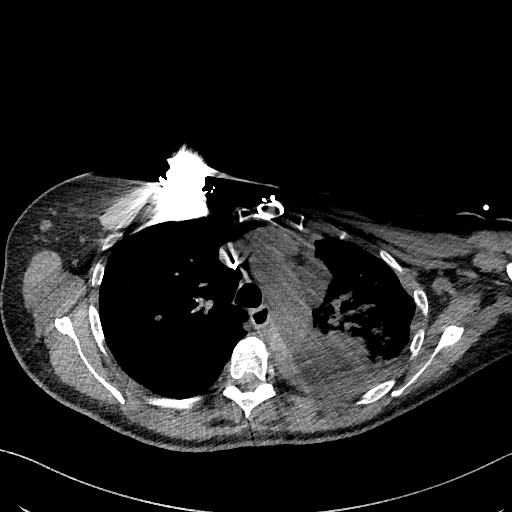
[im 112/159  lung]
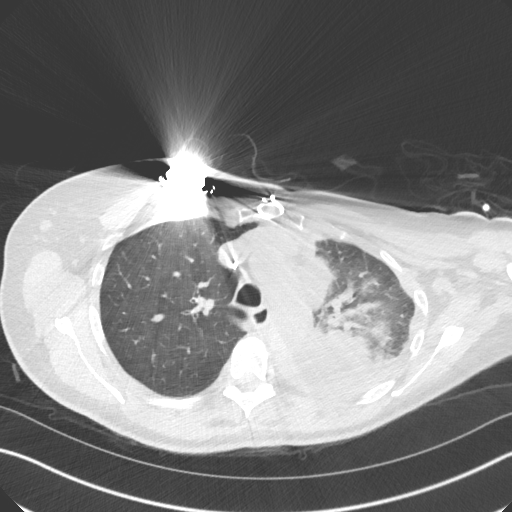
[im 123/159  lung]
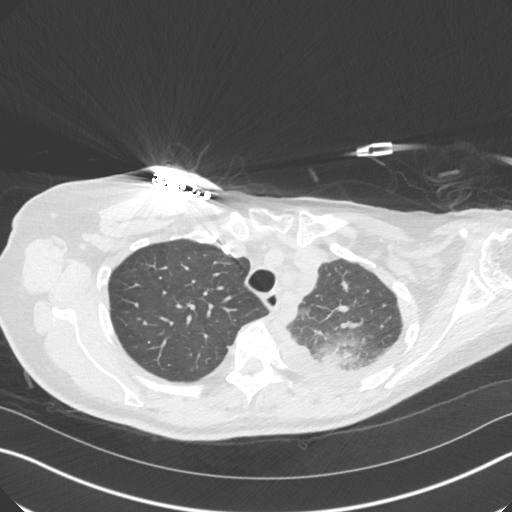
[im 135/159  lung]
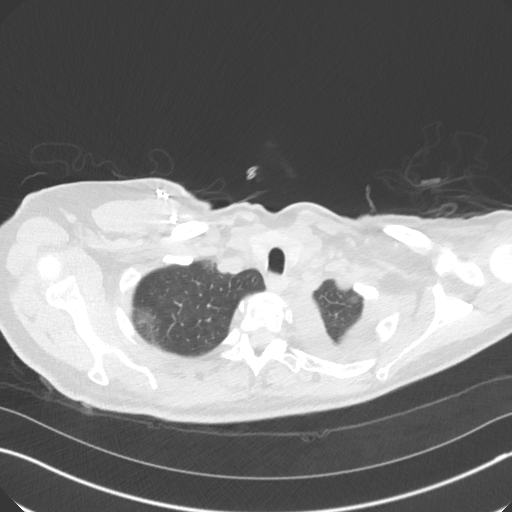
[im 147/159  lung]
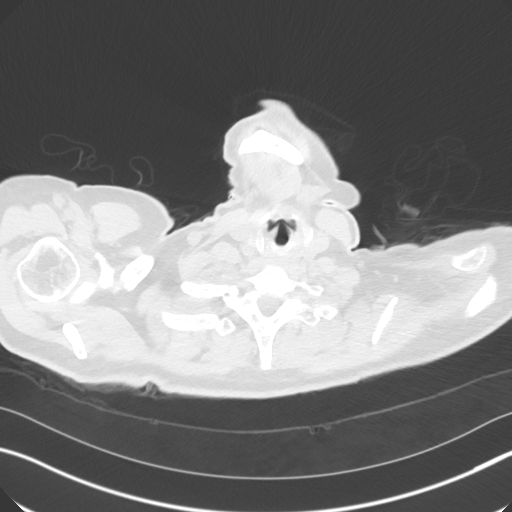

[Series 5: chest w/o 2mm st cor · coronal · non-contrast · 0.62mm/px · 3 of 121 slices shown]
[im 25/121  lung]
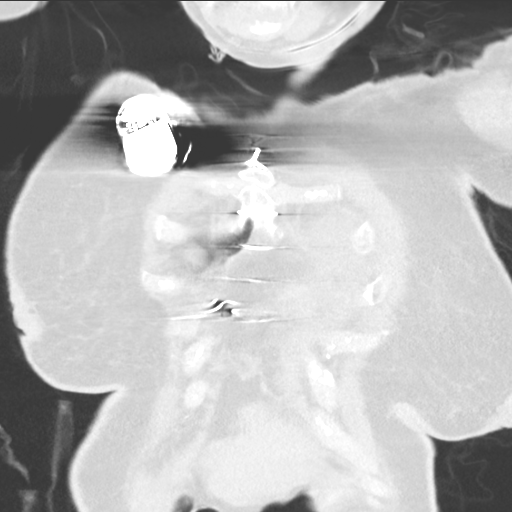
[im 49/121  lung]
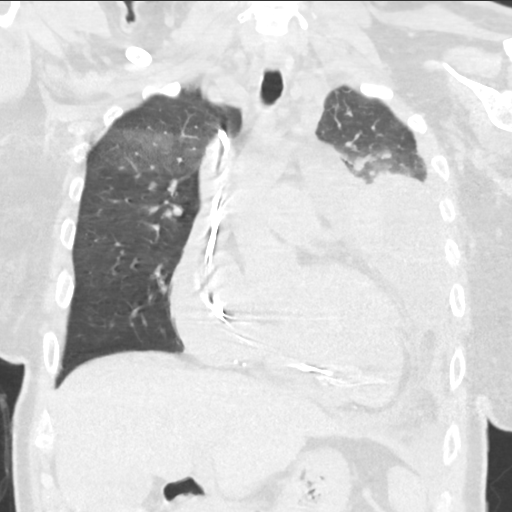
[im 73/121  lung]
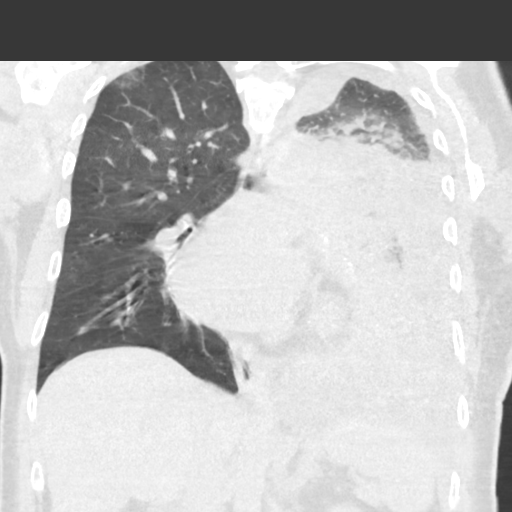

[15 of 36 positions shown; findings below may reference images not displayed]

FINDINGS: Cardiovascular: Pacemaker with leads in the right atrium and
ventricle. Thoracic aorta is normal in caliber. There is multi
chamber cardiomegaly, with prominent left atrial dilatation.
Coronary artery calcifications. Mitral annulus calcifications. No
definite pericardial effusion, fluid abutting the superior heart
border felt to be loculated pleural effusion.

Mediastinum/Nodes: Limited assessment for adenopathy. Lack of IV
contrast as well as streak artifact from right-sided pacemaker.
There is a 12 mm lower paratracheal node. The esophagus is displaced
posteriorly by left atrial enlargement. No visualized thyroid
nodule.

Lungs/Pleura: Motion artifact limits assessment. Left mainstem
bronchus is completely opacified with lobulated density just below
the carina. Left lower lobe bronchus is completely opacified.
Minimal air within the segmental branch of the left lower lobe,
however no significant aerated lung. Left upper lobe bronchus is
opacified. Minimal air within the segmental bronchus of the anterior
upper lobe. Small portion of left upper lobe is aerated.
Ground-glass and dependent opacities within the aerated left upper
lobe. Moderate size left pleural effusion which is partially
loculated. Density measurements of the pleural fluid are limited
given streak artifact from pacemaker, however does not appear
increased density such is hemothorax. No pneumothorax.

Ground-glass opacity in the periphery of the right upper lobe,
series 4, image 23. Right lung otherwise clear. There is no right
pleural effusion.

Upper Abdomen: Coarse calcification in the spleen, also seen on
prior exam. Cirrhotic hepatic morphology. Subcapsular 13 mm
low-density in the right lobe is likely cyst, but incompletely
characterized. There is left adrenal thickening without dominant
nodule.

Musculoskeletal: Median sternotomy. No evidence of focal bone lesion
or acute osseous abnormality.
IMPRESSION: 1. Moderate size partially loculated left pleural effusion.
2. Significant filling of the left bronchial tree, completely
filling the left mainstem bronchus, left lower lobe bronchus, and
incomplete filling the left upper lobe bronchus. This may represent
aspiration. Recommend bronchoscopy for further evaluation to exclude
possibility of underlying obstructive mass.
3. Complete collapse of the left lower lobe. Left upper lobe
demonstrates ground-glass opacity which may be compressive
atelectasis or pneumonia.
4. Small focal ground-glass opacity in the periphery of the right
upper lobe is nonspecific.
5. Multi chamber cardiomegaly with prominent left atrial enlargement
which causes mass effect on the adjacent esophagus.
6. Cirrhosis.

Aortic Atherosclerosis ([id number]-HEO.O).

## 2020-11-06 IMAGING — DX DG CHEST 1V PORT
1 series · 1 of 1 positions shown · non-contrast
Comparison: 01/06/2020

CLINICAL DATA: Follow-up chest tube

EXAM:
PORTABLE CHEST 1 VIEW

[chest]
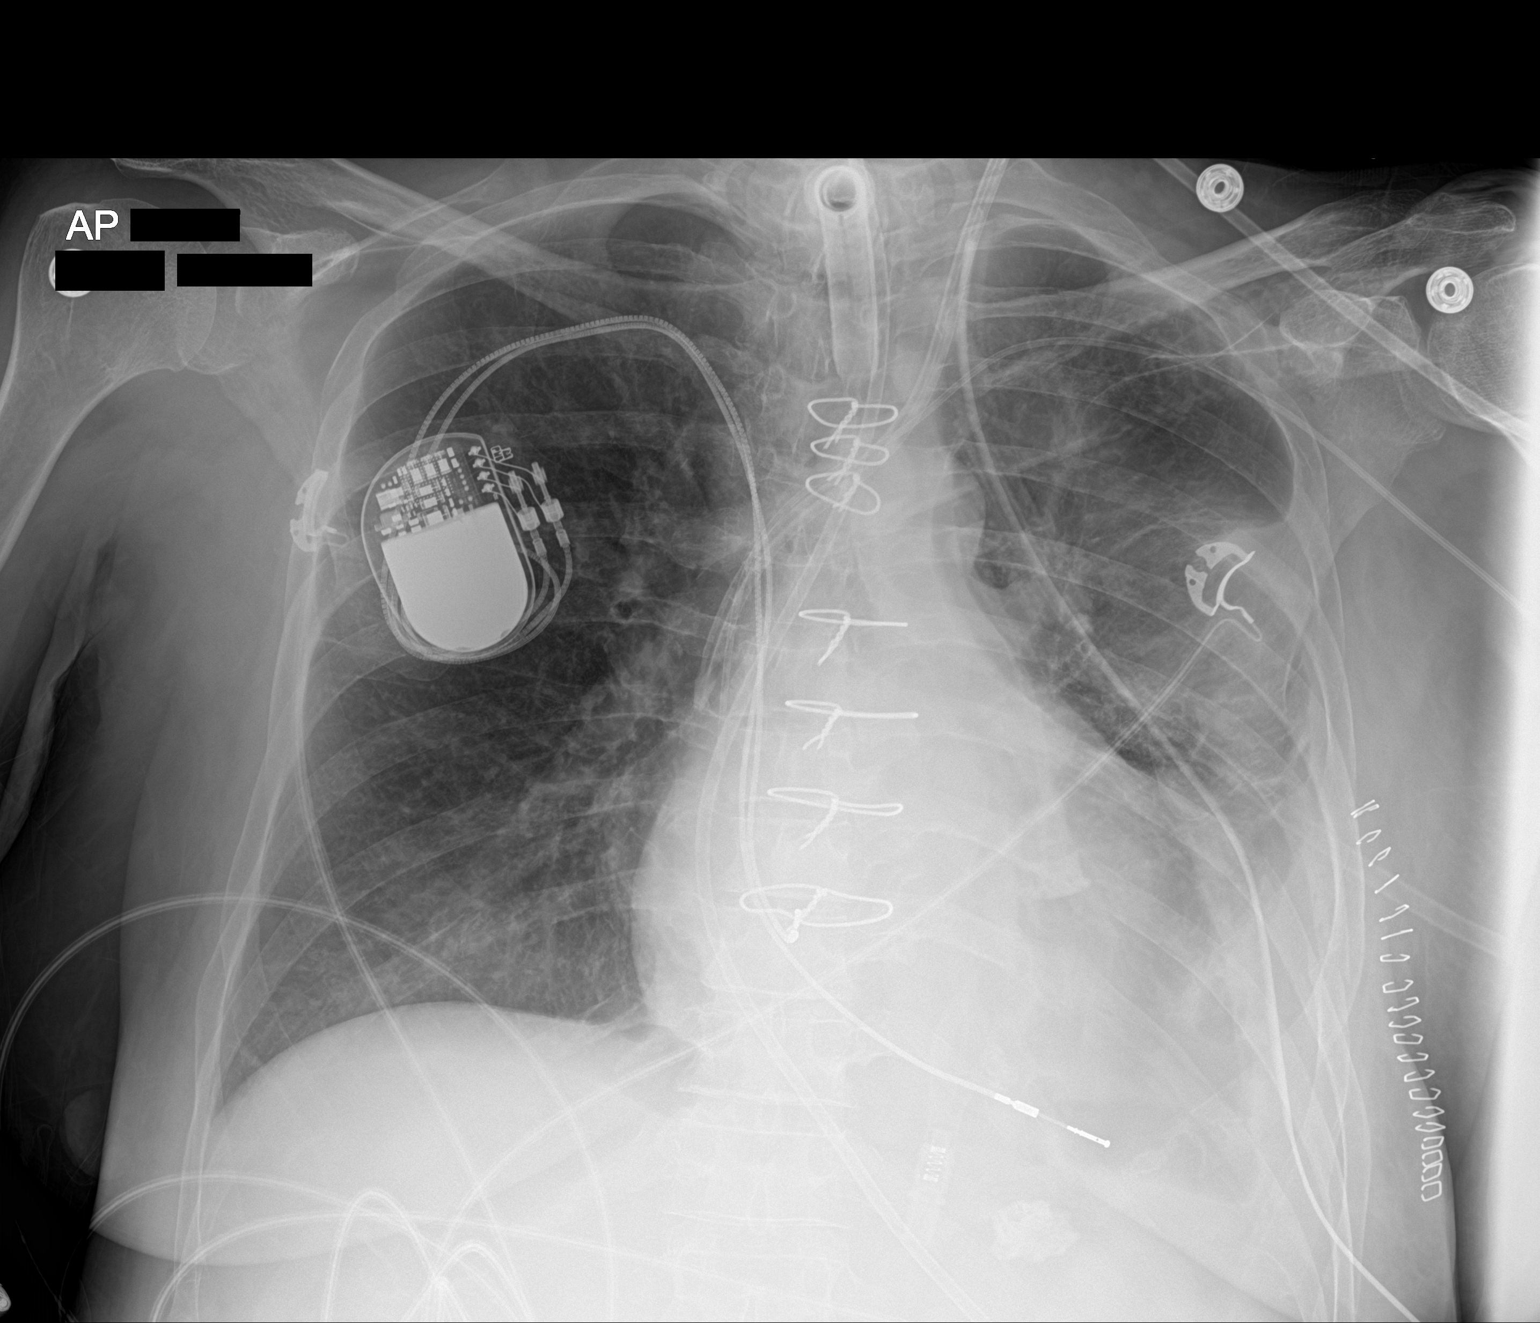

[1 of 1 positions shown; findings below may reference images not displayed]

FINDINGS: Cardiac shadow is stable. Postoperative changes are again
identified. Feeding catheter, tracheostomy tube, left-sided PICC
line and left jugular central line are again seen and stable.
Left-sided chest tube is noted with the tip overlying the left apex.
No pneumothorax is seen. Persistent pleural thickening is noted on
the left. Small left effusion is again identified. Right lung
remains clear. Pacing device is noted.
IMPRESSION: Overall stable appearance of the chest. No recurrent pneumothorax is
noted.

## 2020-11-19 IMAGING — DX DG CHEST 1V PORT
1 series · 1 of 1 positions shown · non-contrast
Comparison: 01/18/2020

CLINICAL DATA: Dyspnea.  Tracheostomy tube.

EXAM:
PORTABLE CHEST 1 VIEW

[chest ap]
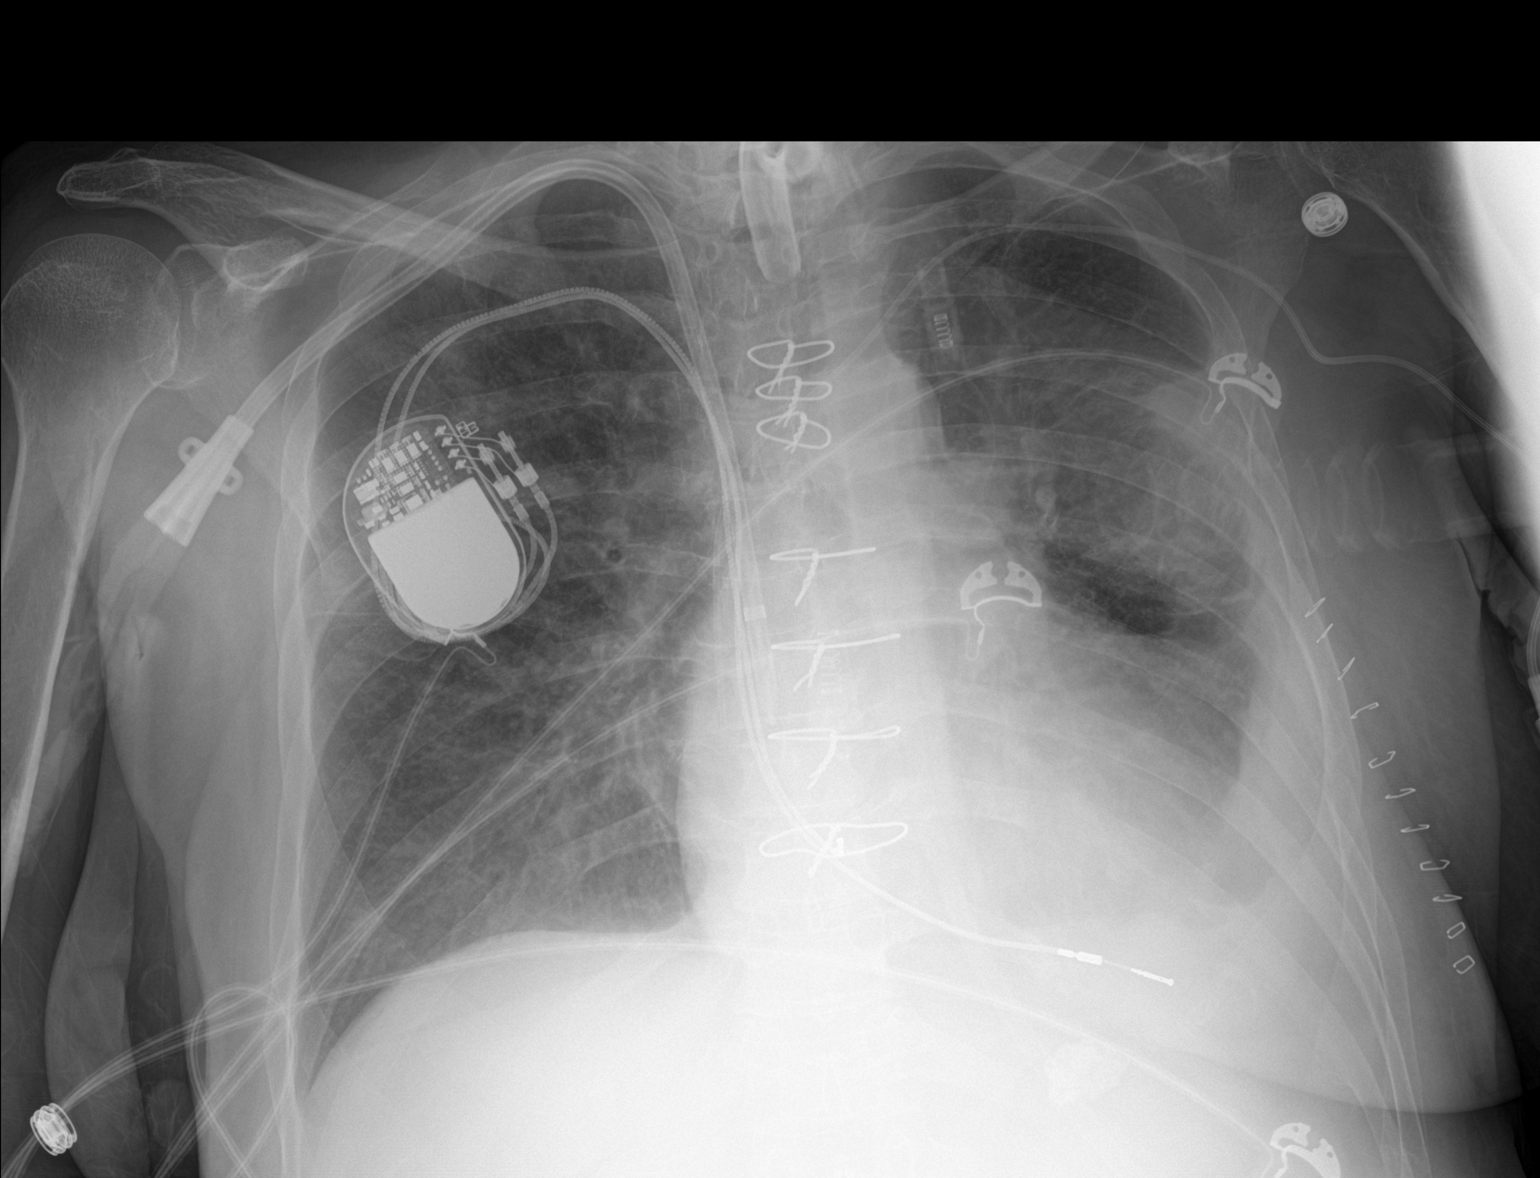

[1 of 1 positions shown; findings below may reference images not displayed]

FINDINGS: Tracheostomy tube projects over the tracheal air column. Right sided
dialysis catheter noted with tip projecting over the lower SVC. Dual
lead pacer noted. Left-sided PICC line tip: Lower SVC.

Moderate left pleural effusion with blunting left costophrenic angle
similar to prior. Hazy airspace opacity in the left mid lung and
left lung base.

Indistinct pulmonary vasculature.

Improved aeration in the right lower lobe along the hemidiaphragm
compared to the 01/18/2020 exam.

Bony demineralization.
IMPRESSION: 1. Stable moderate left pleural effusion with hazy airspace opacity
in the left mid lung and left lung base.
2. Improved aeration in the right lower lobe along the
hemidiaphragm.
3. Indistinct pulmonary vasculature may reflect pulmonary venous
hypertension.
